# Patient Record
Sex: Female | Born: 1985 | Race: Black or African American | Hispanic: No | Marital: Single | Smoking: Never smoker
Health system: Southern US, Community
[De-identification: ages and names within clinical notes are randomized; demographics above are authoritative.]

## PROBLEM LIST (undated history)

## (undated) ENCOUNTER — Emergency Department (HOSPITAL_COMMUNITY): Discharge status: Absent without leave | Payer: Self-pay

## (undated) ENCOUNTER — Inpatient Hospital Stay (HOSPITAL_COMMUNITY): Payer: Self-pay

## (undated) DIAGNOSIS — A6000 Herpesviral infection of urogenital system, unspecified: Secondary | ICD-10-CM

## (undated) DIAGNOSIS — N390 Urinary tract infection, site not specified: Secondary | ICD-10-CM

## (undated) DIAGNOSIS — I10 Essential (primary) hypertension: Secondary | ICD-10-CM

## (undated) DIAGNOSIS — D259 Leiomyoma of uterus, unspecified: Secondary | ICD-10-CM

## (undated) DIAGNOSIS — E119 Type 2 diabetes mellitus without complications: Secondary | ICD-10-CM

## (undated) DIAGNOSIS — O365931 Maternal care for other known or suspected poor fetal growth, third trimester, fetus 1: Secondary | ICD-10-CM | POA: Insufficient documentation

## (undated) DIAGNOSIS — O30033 Twin pregnancy, monochorionic/diamniotic, third trimester: Secondary | ICD-10-CM

## (undated) DIAGNOSIS — M797 Fibromyalgia: Secondary | ICD-10-CM

## (undated) DIAGNOSIS — Z3A32 32 weeks gestation of pregnancy: Secondary | ICD-10-CM | POA: Insufficient documentation

## (undated) HISTORY — DX: Essential (primary) hypertension: I10

## (undated) HISTORY — PX: APPENDECTOMY: SHX54

## (undated) HISTORY — PX: WISDOM TOOTH EXTRACTION: SHX21

## (undated) HISTORY — DX: Urinary tract infection, site not specified: N39.0

## (undated) HISTORY — DX: Fibromyalgia: M79.7

## (undated) HISTORY — PX: FOOT SURGERY: SHX648

## (undated) HISTORY — DX: Herpesviral infection of urogenital system, unspecified: A60.00

---
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
## 2006-04-29 DIAGNOSIS — R358 Other polyuria: Secondary | ICD-10-CM | POA: Insufficient documentation

## 2006-04-29 DIAGNOSIS — R3589 Other polyuria: Secondary | ICD-10-CM | POA: Insufficient documentation

## 2008-05-12 ENCOUNTER — Emergency Department (HOSPITAL_COMMUNITY): Admission: EM | Admit: 2008-05-12 | Discharge: 2008-05-12 | Payer: Self-pay | Admitting: Family Medicine

## 2008-09-12 ENCOUNTER — Emergency Department (HOSPITAL_COMMUNITY): Admission: EM | Admit: 2008-09-12 | Discharge: 2008-09-12 | Payer: Self-pay | Admitting: Family Medicine

## 2009-01-16 ENCOUNTER — Ambulatory Visit: Payer: Self-pay | Admitting: Obstetrics and Gynecology

## 2009-01-17 ENCOUNTER — Encounter: Payer: Self-pay | Admitting: Family

## 2009-01-17 LAB — CONVERTED CEMR LAB
Trich, Wet Prep: NONE SEEN
WBC, Wet Prep HPF POC: NONE SEEN
Yeast Wet Prep HPF POC: NONE SEEN

## 2009-03-03 ENCOUNTER — Emergency Department (HOSPITAL_COMMUNITY): Admission: EM | Admit: 2009-03-03 | Discharge: 2009-03-03 | Payer: Self-pay | Admitting: Family Medicine

## 2009-03-30 ENCOUNTER — Observation Stay (HOSPITAL_COMMUNITY): Admission: EM | Admit: 2009-03-30 | Discharge: 2009-03-30 | Payer: Self-pay | Admitting: Emergency Medicine

## 2009-06-07 ENCOUNTER — Emergency Department (HOSPITAL_COMMUNITY): Admission: EM | Admit: 2009-06-07 | Discharge: 2009-06-07 | Payer: Self-pay | Admitting: Emergency Medicine

## 2010-04-22 ENCOUNTER — Inpatient Hospital Stay (INDEPENDENT_AMBULATORY_CARE_PROVIDER_SITE_OTHER)
Admission: RE | Admit: 2010-04-22 | Discharge: 2010-04-22 | Disposition: A | Discharge status: Home / Self Care | Payer: Self-pay | Source: Ambulatory Visit | Attending: Emergency Medicine | Admitting: Emergency Medicine

## 2010-04-22 DIAGNOSIS — J40 Bronchitis, not specified as acute or chronic: Secondary | ICD-10-CM

## 2010-05-16 ENCOUNTER — Inpatient Hospital Stay (INDEPENDENT_AMBULATORY_CARE_PROVIDER_SITE_OTHER)
Admission: RE | Admit: 2010-05-16 | Discharge: 2010-05-16 | Disposition: A | Discharge status: Home / Self Care | Payer: Self-pay | Source: Ambulatory Visit | Attending: Family Medicine | Admitting: Family Medicine

## 2010-05-16 DIAGNOSIS — I889 Nonspecific lymphadenitis, unspecified: Secondary | ICD-10-CM

## 2010-05-16 LAB — POCT RAPID STREP A (OFFICE): Streptococcus, Group A Screen (Direct): NEGATIVE

## 2010-05-18 LAB — RAPID STREP SCREEN (MED CTR MEBANE ONLY): Streptococcus, Group A Screen (Direct): NEGATIVE

## 2010-05-19 ENCOUNTER — Inpatient Hospital Stay (INDEPENDENT_AMBULATORY_CARE_PROVIDER_SITE_OTHER)
Admission: RE | Admit: 2010-05-19 | Discharge: 2010-05-19 | Disposition: A | Discharge status: Home / Self Care | Payer: Self-pay | Source: Ambulatory Visit | Attending: Family Medicine | Admitting: Family Medicine

## 2010-05-19 DIAGNOSIS — H6980 Other specified disorders of Eustachian tube, unspecified ear: Secondary | ICD-10-CM

## 2010-11-05 ENCOUNTER — Inpatient Hospital Stay (INDEPENDENT_AMBULATORY_CARE_PROVIDER_SITE_OTHER)
Admission: RE | Admit: 2010-11-05 | Discharge: 2010-11-05 | Disposition: A | Discharge status: Home / Self Care | Payer: Self-pay | Source: Ambulatory Visit | Attending: Emergency Medicine | Admitting: Emergency Medicine

## 2010-11-05 ENCOUNTER — Emergency Department (HOSPITAL_COMMUNITY)
Admission: EM | Admit: 2010-11-05 | Discharge: 2010-11-05 | Disposition: A | Discharge status: Home / Self Care | Payer: Self-pay | Attending: Emergency Medicine | Admitting: Emergency Medicine

## 2010-11-05 ENCOUNTER — Emergency Department (HOSPITAL_COMMUNITY): Payer: Self-pay

## 2010-11-05 ENCOUNTER — Ambulatory Visit (INDEPENDENT_AMBULATORY_CARE_PROVIDER_SITE_OTHER): Payer: Self-pay

## 2010-11-05 DIAGNOSIS — R1011 Right upper quadrant pain: Secondary | ICD-10-CM

## 2010-11-05 DIAGNOSIS — N898 Other specified noninflammatory disorders of vagina: Secondary | ICD-10-CM | POA: Insufficient documentation

## 2010-11-05 DIAGNOSIS — N949 Unspecified condition associated with female genital organs and menstrual cycle: Secondary | ICD-10-CM | POA: Insufficient documentation

## 2010-11-05 DIAGNOSIS — R1031 Right lower quadrant pain: Secondary | ICD-10-CM

## 2010-11-05 DIAGNOSIS — D259 Leiomyoma of uterus, unspecified: Secondary | ICD-10-CM | POA: Insufficient documentation

## 2010-11-05 LAB — DIFFERENTIAL
Basophils Absolute: 0 10*3/uL (ref 0.0–0.1)
Basophils Relative: 0 % (ref 0–1)
Eosinophils Absolute: 0.1 10*3/uL (ref 0.0–0.7)
Eosinophils Relative: 2 % (ref 0–5)
Lymphocytes Relative: 38 % (ref 12–46)
Lymphs Abs: 2.6 10*3/uL (ref 0.7–4.0)
Monocytes Absolute: 0.6 10*3/uL (ref 0.1–1.0)
Monocytes Relative: 8 % (ref 3–12)
Neutro Abs: 3.5 10*3/uL (ref 1.7–7.7)
Neutrophils Relative %: 52 % (ref 43–77)

## 2010-11-05 LAB — CBC
HCT: 36.9 % (ref 36.0–46.0)
Hemoglobin: 12.5 g/dL (ref 12.0–15.0)
MCH: 29.8 pg (ref 26.0–34.0)
MCHC: 33.9 g/dL (ref 30.0–36.0)
MCV: 87.9 fL (ref 78.0–100.0)
Platelets: 282 10*3/uL (ref 150–400)
RBC: 4.2 MIL/uL (ref 3.87–5.11)
RDW: 14.4 % (ref 11.5–15.5)
WBC: 6.8 10*3/uL (ref 4.0–10.5)

## 2010-11-05 LAB — WET PREP, GENITAL
Clue Cells Wet Prep HPF POC: NONE SEEN
Trich, Wet Prep: NONE SEEN
Yeast Wet Prep HPF POC: NONE SEEN

## 2010-11-05 LAB — BASIC METABOLIC PANEL
BUN: 9 mg/dL (ref 6–23)
CO2: 22 mEq/L (ref 19–32)
Calcium: 9.5 mg/dL (ref 8.4–10.5)
Chloride: 105 mEq/L (ref 96–112)
Creatinine, Ser: 0.6 mg/dL (ref 0.50–1.10)
GFR calc Af Amer: >60 mL/min (ref >60)
GFR calc non Af Amer: >60 mL/min (ref >60)
Glucose, Bld: 82 mg/dL (ref 70–99)
Potassium: 3.9 mEq/L (ref 3.5–5.1)
Sodium: 137 mEq/L (ref 135–145)

## 2010-11-05 LAB — URINALYSIS, ROUTINE W REFLEX MICROSCOPIC
Bilirubin Urine: NEGATIVE
Glucose, UA: NEGATIVE mg/dL
Hgb urine dipstick: NEGATIVE
Ketones, ur: NEGATIVE mg/dL
Leukocytes, UA: NEGATIVE
Nitrite: NEGATIVE
Protein, ur: NEGATIVE mg/dL
Specific Gravity, Urine: 1.012 (ref 1.005–1.030)
Urobilinogen, UA: 0.2 mg/dL (ref 0.0–1.0)
pH: 7 (ref 5.0–8.0)

## 2010-11-05 LAB — POCT URINALYSIS DIP (DEVICE)
Bilirubin Urine: NEGATIVE
Glucose, UA: NEGATIVE mg/dL
Ketones, ur: NEGATIVE mg/dL
Leukocytes, UA: NEGATIVE
Nitrite: NEGATIVE
Protein, ur: NEGATIVE mg/dL
Specific Gravity, Urine: 1.025 (ref 1.005–1.030)
Urobilinogen, UA: 0.2 mg/dL (ref 0.0–1.0)
pH: 6 (ref 5.0–8.0)

## 2010-11-05 LAB — POCT PREGNANCY, URINE: Preg Test, Ur: NEGATIVE

## 2010-11-06 LAB — GC/CHLAMYDIA PROBE AMP, GENITAL
Chlamydia, DNA Probe: NEGATIVE
GC Probe Amp, Genital: NEGATIVE

## 2010-11-13 ENCOUNTER — Inpatient Hospital Stay (INDEPENDENT_AMBULATORY_CARE_PROVIDER_SITE_OTHER)
Admission: RE | Admit: 2010-11-13 | Discharge: 2010-11-13 | Disposition: A | Discharge status: Home / Self Care | Payer: Self-pay | Source: Ambulatory Visit | Attending: Emergency Medicine | Admitting: Emergency Medicine

## 2010-11-13 DIAGNOSIS — J069 Acute upper respiratory infection, unspecified: Secondary | ICD-10-CM

## 2010-12-19 ENCOUNTER — Ambulatory Visit (INDEPENDENT_AMBULATORY_CARE_PROVIDER_SITE_OTHER): Payer: Self-pay | Admitting: Obstetrics and Gynecology

## 2010-12-19 ENCOUNTER — Encounter: Payer: Self-pay | Admitting: Obstetrics and Gynecology

## 2010-12-19 DIAGNOSIS — R109 Unspecified abdominal pain: Secondary | ICD-10-CM | POA: Insufficient documentation

## 2010-12-19 DIAGNOSIS — R1084 Generalized abdominal pain: Secondary | ICD-10-CM

## 2010-12-19 LAB — COMPREHENSIVE METABOLIC PANEL
ALT: 12 U/L (ref 0–35)
AST: 16 U/L (ref 0–37)
Albumin: 4.2 g/dL (ref 3.5–5.2)
Alkaline Phosphatase: 59 U/L (ref 39–117)
BUN: 12 mg/dL (ref 6–23)
CO2: 22 mEq/L (ref 19–32)
Calcium: 9.4 mg/dL (ref 8.4–10.5)
Chloride: 106 mEq/L (ref 96–112)
Creat: 0.65 mg/dL (ref 0.50–1.10)
Glucose, Bld: 82 mg/dL (ref 70–99)
Potassium: 3.8 mEq/L (ref 3.5–5.3)
Sodium: 137 mEq/L (ref 135–145)
Total Bilirubin: 0.3 mg/dL (ref 0.3–1.2)
Total Protein: 7.4 g/dL (ref 6.0–8.3)

## 2010-12-19 LAB — LIPASE: Lipase: 28 U/L (ref 0–75)

## 2010-12-19 LAB — AMYLASE: Amylase: 44 U/L (ref 0–105)

## 2010-12-19 NOTE — Patient Instructions (Signed)
Abdominal Pain (Nonspecific) Your exam might not show the exact reason you have abdominal pain. Since there are many different causes of abdominal pain, another checkup and more tests may be needed. It is very important to follow up for lasting (persistent) or worsening symptoms. A possible cause of abdominal pain in any person who still has his or her appendix is acute appendicitis. Appendicitis is often hard to diagnose. Normal blood tests, urine tests, ultrasound, and CT scans do not completely rule out early appendicitis or other causes of abdominal pain. Sometimes, only the changes that happen over time will allow appendicitis and other causes of abdominal pain to be determined. Other potential problems that may require surgery may also take time to become more apparent. Because of this, it is important that you follow all of the instructions below. HOME CARE INSTRUCTIONS   Rest as much as possible.   Do not eat solid food until your pain is gone.   While adults or children have pain: A diet of water, weak decaffeinated tea, broth or bouillon, gelatin, oral rehydration solutions (ORS), frozen ice pops, or ice chips may be helpful.   When pain is gone in adults or children: Start a light diet (dry toast, crackers, applesauce, or white rice). Increase the diet slowly as long as it does not bother you. Eat no dairy products (including cheese and eggs) and no spicy, fatty, fried, or high-fiber foods.   Use no alcohol, caffeine, or cigarettes.   Take your regular medicines unless your caregiver told you not to.   Take any prescribed medicine as directed.   Only take over-the-counter or prescription medicines for pain, discomfort, or fever as directed by your caregiver. Do not give aspirin to children.  If your caregiver has given you a follow-up appointment, it is very important to keep that appointment. Not keeping the appointment could result in a permanent injury and/or lasting (chronic) pain  and/or disability. If there is any problem keeping the appointment, you must call to reschedule.  SEEK IMMEDIATE MEDICAL CARE IF:   Your pain is not gone in 24 hours.   Your pain becomes worse, changes location, or feels different.   You or your child has an oral temperature above 102 F (38.9 C), not controlled by medicine.   Your baby is older than 3 months with a rectal temperature of 102 F (38.9 C) or higher.   Your baby is 3 months old or younger with a rectal temperature of 100.4 F (38 C) or higher.   You have shaking chills.   You keep throwing up (vomiting) or cannot drink liquids.   There is blood in your vomit or you see blood in your bowel movements.   Your bowel movements become dark or black.   You have frequent bowel movements.   Your bowel movements stop (become blocked) or you cannot pass gas.   You have bloody, frequent, or painful urination.   You have yellow discoloration in the skin or whites of the eyes.   Your stomach becomes bloated or bigger.   You have dizziness or fainting.   You have chest or back pain.  MAKE SURE YOU:   Understand these instructions.   Will watch your condition.   Will get help right away if you are not doing well or get worse.  Document Released: 02/16/2005 Document Revised: 10/29/2010 Document Reviewed: 01/14/2009 Exit[employer] Patient Information 2012 [employer]. 

## 2010-12-19 NOTE — Progress Notes (Signed)
  Subjective:    Patient ID: Sherri [name], female    DOB: Sep 13, 1985, 25 y.o.   MRN: [id number]  HPI 25 yo G0 presenting today for evaluation of abdominal pain. Patient reports being seen in the ED on 9/4 for the same complaint and was diagnosed with a 2.4 cm fibroid localized in the right lower uterine segment. Patient states that the pain is unchanged occurrying mainly at night but sometimes related to food intake. The pain is localized in the RUQ, non-radiating, sharp in nature and typically last for 4 hours. Patient states that she typically wakes up the next morning pain free. Patient is otherwise without complaints and reports normal regular bowel movements.  History reviewed. No pertinent past medical history.  History reviewed. No pertinent past surgical history.  History  Substance Use Topics  . Smoking status: Never Smoker   . Smokeless tobacco: Never Used  . Alcohol Use: No   History reviewed. No pertinent family history.    Review of Systems  All other systems reviewed and are negative.       Objective:   Physical Exam  Constitutional: She is oriented to person, place, and time. She appears well-developed and well-nourished.  HENT:  Head: Normocephalic and atraumatic.  Eyes: EOM are normal. Pupils are equal, round, and reactive to light.  Neck: Normal range of motion. Neck supple.  Cardiovascular: Normal rate and regular rhythm.   Pulmonary/Chest: Effort normal and breath sounds normal.  Abdominal: Soft. Bowel sounds are normal. She exhibits no distension and no mass. There is tenderness. There is no rebound and no guarding.       Point tenderness in RUQ, no rebound, no guarding.  Genitourinary: Uterus normal.       No palpable masses or tenderness  Neurological: She is alert and oriented to person, place, and time.  Skin: Skin is warm. No rash noted. No erythema.      Assessment & Plan:   25 yo Go with RUQ pain - will check CMP and amylase, lipase - RUQ  ultrasound ordered - patient [facility] for further evaluation as there does not appear to be any gynecologic origin to her pain. - patient to return for full gynecologic visit at her discretion

## 2010-12-25 ENCOUNTER — Ambulatory Visit (HOSPITAL_COMMUNITY)
Admission: RE | Admit: 2010-12-25 | Discharge: 2010-12-25 | Disposition: A | Discharge status: Home / Self Care | Payer: Self-pay | Source: Ambulatory Visit | Attending: Obstetrics and Gynecology | Admitting: Obstetrics and Gynecology

## 2010-12-25 DIAGNOSIS — R1011 Right upper quadrant pain: Secondary | ICD-10-CM | POA: Insufficient documentation

## 2011-01-01 ENCOUNTER — Telehealth: Payer: Self-pay | Admitting: *Deleted

## 2011-01-01 NOTE — Telephone Encounter (Signed)
Called patient- gave patient results of lab and Ultrasound.  Patient states still having same pain- referral visit notes and found referral to [facility]- patient states she has not gotten an appointment. Informed patient we would need to make referral and call her with an appointment

## 2011-01-01 NOTE — Telephone Encounter (Signed)
Pt called wanting results of lab work and ultra sound. Pt can be reached at [phone].

## 2011-01-07 NOTE — Telephone Encounter (Signed)
Patient to be established at [facility].  Will route to the NP coordinator to set up NP appointment.

## 2011-01-07 NOTE — Telephone Encounter (Signed)
Here is the referral

## 2011-01-07 NOTE — Telephone Encounter (Signed)
Am forwarding this note to Sherri [name] At [facility] for referral appointment- please call patient with appt. thanks

## 2011-01-08 NOTE — Telephone Encounter (Signed)
Mailed np packet to patient, once returned will call to schedule appt, pt agreed.

## 2011-02-04 ENCOUNTER — Other Ambulatory Visit (HOSPITAL_COMMUNITY)
Admission: RE | Admit: 2011-02-04 | Discharge: 2011-02-04 | Disposition: A | Discharge status: Home / Self Care | Payer: Medicaid Other | Source: Ambulatory Visit | Attending: Obstetrics & Gynecology | Admitting: Obstetrics & Gynecology

## 2011-02-04 ENCOUNTER — Encounter: Payer: Self-pay | Admitting: Obstetrics and Gynecology

## 2011-02-04 ENCOUNTER — Ambulatory Visit (INDEPENDENT_AMBULATORY_CARE_PROVIDER_SITE_OTHER): Payer: Medicaid Other | Admitting: Obstetrics and Gynecology

## 2011-02-04 VITALS — BP 122/80 | HR 70 | Temp 97.1°F | Ht 59.0 in | Wt 146.5 lb

## 2011-02-04 DIAGNOSIS — Z01419 Encounter for gynecological examination (general) (routine) without abnormal findings: Secondary | ICD-10-CM

## 2011-02-04 MED ORDER — DROSPIRENONE-ETHINYL ESTRADIOL 3-0.02 MG PO TABS: 1.0000 | ORAL_TABLET | Freq: Every day | ORAL | Status: DC

## 2011-02-04 MED ORDER — METRONIDAZOLE 500 MG PO TABS: 500.0000 mg | ORAL_TABLET | Freq: Two times a day (BID) | ORAL | Status: AC

## 2011-02-06 NOTE — Progress Notes (Signed)
S: Pt presents today for yearly Pap and PE. She states she is doing well but has noticed a vag dc with some irritation. She is currently taking Yaz for birth control would like to continue. She has no other complaints at this time. O: VSS A&Ox3 in NAD HEENT essentially normal Heart RRR without murmur or gallop LCTAB Abd soft, nontender NL external genitalia Thin, white vag dc present in vag vault. Wet prep done and shows clue cells. Pap done without difficulty. Bimanual exam reveals uterus to be NL size and shape with no adnexal masses. Pt nontender on exam. A/P: Yearly: pt doing well. Will refill Yaz x 45yr. Discussed safe sex practices. She will return in 55yr for PE.  BV: discussed with pt at length. Will tx with Flagyl. Warned of antabuse reaction. Discussed diet, activity, risks, and precautions.  Sherri [name]. Rice III, DrHSc, MPAS, PA-C

## 2011-02-09 ENCOUNTER — Ambulatory Visit: Payer: Self-pay | Admitting: Family Medicine

## 2011-02-19 ENCOUNTER — Encounter (HOSPITAL_COMMUNITY): Payer: Self-pay | Admitting: Emergency Medicine

## 2011-02-19 ENCOUNTER — Encounter (HOSPITAL_COMMUNITY): Payer: Self-pay

## 2011-02-19 ENCOUNTER — Emergency Department (HOSPITAL_COMMUNITY)
Admission: EM | Admit: 2011-02-19 | Discharge: 2011-02-20 | Disposition: A | Discharge status: Home / Self Care | Payer: Medicaid Other | Attending: Emergency Medicine | Admitting: Emergency Medicine

## 2011-02-19 ENCOUNTER — Emergency Department (INDEPENDENT_AMBULATORY_CARE_PROVIDER_SITE_OTHER)
Admission: EM | Admit: 2011-02-19 | Discharge: 2011-02-19 | Disposition: A | Discharge status: Home / Self Care | Payer: Medicaid Other | Source: Home / Self Care | Attending: Family Medicine | Admitting: Family Medicine

## 2011-02-19 DIAGNOSIS — N920 Excessive and frequent menstruation with regular cycle: Secondary | ICD-10-CM | POA: Insufficient documentation

## 2011-02-19 DIAGNOSIS — D259 Leiomyoma of uterus, unspecified: Secondary | ICD-10-CM | POA: Insufficient documentation

## 2011-02-19 DIAGNOSIS — N949 Unspecified condition associated with female genital organs and menstrual cycle: Secondary | ICD-10-CM

## 2011-02-19 DIAGNOSIS — R102 Pelvic and perineal pain: Secondary | ICD-10-CM

## 2011-02-19 DIAGNOSIS — D219 Benign neoplasm of connective and other soft tissue, unspecified: Secondary | ICD-10-CM

## 2011-02-19 DIAGNOSIS — N898 Other specified noninflammatory disorders of vagina: Secondary | ICD-10-CM

## 2011-02-19 DIAGNOSIS — R1032 Left lower quadrant pain: Secondary | ICD-10-CM | POA: Insufficient documentation

## 2011-02-19 DIAGNOSIS — N939 Abnormal uterine and vaginal bleeding, unspecified: Secondary | ICD-10-CM

## 2011-02-19 HISTORY — DX: Leiomyoma of uterus, unspecified: D25.9

## 2011-02-19 LAB — POCT URINALYSIS DIP (DEVICE)
Bilirubin Urine: NEGATIVE
Glucose, UA: NEGATIVE mg/dL
Ketones, ur: NEGATIVE mg/dL
Leukocytes, UA: NEGATIVE
Nitrite: NEGATIVE
Protein, ur: 30 mg/dL — AB
Specific Gravity, Urine: 1.02 (ref 1.005–1.030)
Urobilinogen, UA: 0.2 mg/dL (ref 0.0–1.0)
pH: 5.5 (ref 5.0–8.0)

## 2011-02-19 LAB — DIFFERENTIAL
Basophils Absolute: 0 10*3/uL (ref 0.0–0.1)
Basophils Relative: 0 % (ref 0–1)
Eosinophils Absolute: 0.2 10*3/uL (ref 0.0–0.7)
Eosinophils Relative: 3 % (ref 0–5)
Lymphocytes Relative: 56 % — ABNORMAL HIGH (ref 12–46)
Lymphs Abs: 4.1 10*3/uL — ABNORMAL HIGH (ref 0.7–4.0)
Monocytes Absolute: 0.4 10*3/uL (ref 0.1–1.0)
Monocytes Relative: 5 % (ref 3–12)
Neutro Abs: 2.7 10*3/uL (ref 1.7–7.7)
Neutrophils Relative %: 37 % — ABNORMAL LOW (ref 43–77)

## 2011-02-19 LAB — CBC
HCT: 37.3 % (ref 36.0–46.0)
Hemoglobin: 12.4 g/dL (ref 12.0–15.0)
MCH: 29.3 pg (ref 26.0–34.0)
MCHC: 33.2 g/dL (ref 30.0–36.0)
MCV: 88.2 fL (ref 78.0–100.0)
Platelets: 266 10*3/uL (ref 150–400)
RBC: 4.23 MIL/uL (ref 3.87–5.11)
RDW: 14.1 % (ref 11.5–15.5)
WBC: 7.4 10*3/uL (ref 4.0–10.5)

## 2011-02-19 LAB — WET PREP, GENITAL
Clue Cells Wet Prep HPF POC: NONE SEEN
Trich, Wet Prep: NONE SEEN
Yeast Wet Prep HPF POC: NONE SEEN

## 2011-02-19 LAB — POCT PREGNANCY, URINE: Preg Test, Ur: NEGATIVE

## 2011-02-19 MED ORDER — IBUPROFEN 800 MG PO TABS
800.0000 mg | ORAL_TABLET | Freq: Once | ORAL | Status: AC
  Administered 2011-02-19: 800 mg via ORAL

## 2011-02-19 MED ORDER — IBUPROFEN 800 MG PO TABS: 800.0000 mg | ORAL_TABLET | Freq: Three times a day (TID) | ORAL | Status: AC

## 2011-02-19 MED ORDER — IBUPROFEN 800 MG PO TABS
ORAL_TABLET | ORAL | Status: AC
  Filled 2011-02-19: qty 1

## 2011-02-19 NOTE — ED Notes (Signed)
C/o vaginal irritation, urinary frequency and RLQ pain since yesterday.  Denies vaginal discharge or discomfort with voiding.  Also states she had menses 02/08/11 and then started period again yesterday.

## 2011-02-19 NOTE — ED Notes (Signed)
PT. REPORTS VAGINAL BLEEDING WITH RIGHT LATERAL ABDOMINAL PAIN ONSET LAST NIGHT ,  DENIES VOMITTING OR DIARRHEA, SLIGHT NAUSEA.  FEVER WITH CHILLS.

## 2011-02-19 NOTE — ED Provider Notes (Signed)
History     CSN: [id number]  Arrival date & time 02/19/11  4098   First MD Initiated Contact with Patient 02/19/11 1015      Chief Complaint  Patient presents with  . Vaginal Itching  . Menstrual Problem    (Consider location/radiation/quality/duration/timing/severity/associated sxs/prior treatment) HPI Comments: Pt states she had a normal menses 02-08-11. Then began having vaginal bleeding again yesterday. She has been on Yaz for approx 2 yrs and denies missing any pills. Also onset of RLQ abd pain yesterday, achey pain, no worsening. Has genital itching but no vaginal discharge. No new sexual partners. Has had dysuria and urinary frequency also since yesterday.  Pt has had the same pain previously, and was seen in 11-05-10 and had Pelvic US.  Annual pap/pelvic exam was also done this month with GYN.  Patient is a 25 y.o. female presenting with vaginal bleeding and abdominal pain. The history is provided by the patient.  Vaginal Bleeding This is a new problem. The current episode started yesterday. The problem occurs constantly. The problem has not changed since onset.Associated symptoms include abdominal pain. Pertinent negatives include no chest pain and no shortness of breath. The symptoms are aggravated by nothing. The symptoms are relieved by nothing. She has tried nothing for the symptoms.  Abdominal Pain The primary symptoms of the illness include abdominal pain and vaginal bleeding. The primary symptoms of the illness do not include fever, shortness of breath, nausea, vomiting or dysuria. The current episode started yesterday. The onset of the illness was sudden. The problem has not changed since onset. The abdominal pain began yesterday. The pain came on suddenly. The abdominal pain has been unchanged since its onset. The abdominal pain is located in the RLQ. The abdominal pain does not radiate. The abdominal pain is relieved by nothing.  The patient states that she believes she is  currently not pregnant. The patient has not had a change in bowel habit. Additional symptoms associated with the illness include frequency. Symptoms associated with the illness do not include chills, constipation, urgency or back pain.    Past Medical History  Diagnosis Date  . Uterine fibroid     History reviewed. No pertinent past surgical history.  History reviewed. No pertinent family history.  History  Substance Use Topics  . Smoking status: Never Smoker   . Smokeless tobacco: Never Used  . Alcohol Use: No    OB History    Grav Para Term Preterm Abortions TAB SAB Ect Mult Living   0               Review of Systems  Constitutional: Negative for fever, chills and appetite change.  Respiratory: Negative for shortness of breath.   Cardiovascular: Negative for chest pain.  Gastrointestinal: Positive for abdominal pain. Negative for nausea, vomiting and constipation.  Genitourinary: Positive for frequency and vaginal bleeding. Negative for dysuria, urgency and flank pain.  Musculoskeletal: Negative for back pain.    Allergies  Review of patient's allergies indicates no known allergies.  Home Medications   Current Outpatient Rx  Name Route Sig Dispense Refill  . DROSPIRENONE-ETHINYL ESTRADIOL 3-0.02 MG PO TABS Oral Take 1 tablet by mouth daily. 1 Package 12  . IBUPROFEN 800 MG PO TABS Oral Take 1 tablet (800 mg total) by mouth 3 (three) times daily. 15 tablet 0    BP 131/80  Pulse 86  Temp(Src) 98.2 F (36.8 C) (Oral)  Resp 16  SpO2 99%  LMP 02/18/2011  Physical Exam  Nursing note and vitals reviewed. Constitutional: She appears well-developed and well-nourished. No distress.  Neck: Neck supple. No thyromegaly present.  Cardiovascular: Normal rate, regular rhythm and normal heart sounds.   Pulmonary/Chest: Effort normal and breath sounds normal. No respiratory distress.  Abdominal: Soft. Bowel sounds are normal. She exhibits no distension. There is tenderness  in the suprapubic area. There is no rebound, no guarding and no tenderness at McBurney's point.  Genitourinary: Uterus normal. There is no rash, tenderness or lesion on the right labia. There is no rash, tenderness or lesion on the left labia. Cervix exhibits no motion tenderness, no discharge and no friability. Right adnexum displays tenderness. Right adnexum displays no mass and no fullness. Left adnexum displays no mass, no tenderness and no fullness. There is bleeding (small amount of blood noted in vagina, no bleeding from os) around the vagina. No erythema or tenderness around the vagina. No foreign body around the vagina. No signs of injury around the vagina. No vaginal discharge found.  Neurological: She is alert.  Skin: Skin is warm and dry.  Psychiatric: She has a normal mood and affect.    ED Course  Procedures (including critical care time)  Labs Reviewed  POCT URINALYSIS DIP (DEVICE) - Abnormal; Notable for the following:    Hgb urine dipstick LARGE (*)    Protein, ur 30 (*)    All other components within normal limits  WET PREP, GENITAL - Abnormal; Notable for the following:    WBC, Wet Prep HPF POC FEW (*)    All other components within normal limits  POCT PREGNANCY, URINE  GC/CHLAMYDIA PROBE AMP, GENITAL   No results found.   1. Abnormal vaginal bleeding   2. Acute pelvic pain, female   3. Uterine fibroid       MDM  Rt adnexal pain and abnormal vaginal bleeding. Hx of same Rt adnexal pain w/ PUS 11-05-10 at [facility] ED showing uterine fibroid as source of symptoms. PUS and ED notes reviewed. Discussed with pt that this is most likely the cause of her symptoms today. If she has change or worsening of symptoms she will need to go to [facility] ED for further eval.  Urine preg neg. UA neg leuks.        Melody Comas, [place] 02/19/11 2036

## 2011-02-20 ENCOUNTER — Emergency Department (HOSPITAL_COMMUNITY): Payer: Medicaid Other

## 2011-02-20 LAB — BASIC METABOLIC PANEL
BUN: 11 mg/dL (ref 6–23)
CO2: 24 mEq/L (ref 19–32)
Calcium: 9.7 mg/dL (ref 8.4–10.5)
Chloride: 104 mEq/L (ref 96–112)
Creatinine, Ser: 0.7 mg/dL (ref 0.50–1.10)
GFR calc Af Amer: >90 mL/min (ref >90)
GFR calc non Af Amer: >90 mL/min (ref >90)
Glucose, Bld: 96 mg/dL (ref 70–99)
Potassium: 3.7 mEq/L (ref 3.5–5.1)
Sodium: 136 mEq/L (ref 135–145)

## 2011-02-20 LAB — GC/CHLAMYDIA PROBE AMP, GENITAL
Chlamydia, DNA Probe: NEGATIVE
GC Probe Amp, Genital: NEGATIVE

## 2011-02-20 LAB — URINALYSIS, ROUTINE W REFLEX MICROSCOPIC
Bilirubin Urine: NEGATIVE
Glucose, UA: NEGATIVE mg/dL
Ketones, ur: NEGATIVE mg/dL
Leukocytes, UA: NEGATIVE
Nitrite: NEGATIVE
Protein, ur: NEGATIVE mg/dL
Specific Gravity, Urine: 1.023 (ref 1.005–1.030)
Urobilinogen, UA: 1 mg/dL (ref 0.0–1.0)
pH: 7 (ref 5.0–8.0)

## 2011-02-20 LAB — URINE MICROSCOPIC-ADD ON

## 2011-02-20 LAB — POCT PREGNANCY, URINE: Preg Test, Ur: NEGATIVE

## 2011-02-20 MED ORDER — TRAMADOL HCL 50 MG PO TABS: 50.0000 mg | ORAL_TABLET | Freq: Four times a day (QID) | ORAL | Status: AC | PRN

## 2011-02-20 MED ORDER — KETOROLAC TROMETHAMINE 60 MG/2ML IM SOLN: 60.0000 mg | Freq: Once | INTRAMUSCULAR | Status: DC

## 2011-02-20 NOTE — ED Notes (Signed)
Pt refused to have shot. Stated would take  advil at home .

## 2011-02-20 NOTE — ED Provider Notes (Signed)
Medical screening examination/treatment/procedure(s) were performed by non-physician practitioner and as supervising physician I was immediately available for consultation/collaboration.   [facility]; MD   Sharin Grave, MD 02/20/11 [phone]

## 2011-02-20 NOTE — ED Provider Notes (Signed)
History     CSN: [id number]  Arrival date & time 02/19/11  2246   First MD Initiated Contact with Patient 02/20/11 0058      No chief complaint on file.   (Consider location/radiation/quality/duration/timing/severity/associated sxs/prior treatment) Patient is a 25 y.o. female presenting with abdominal pain. The history is provided by the patient. No language interpreter was used.  Abdominal Pain The primary symptoms of the illness include abdominal pain and vaginal bleeding. The primary symptoms of the illness do not include fever, fatigue, shortness of breath, nausea, vomiting, diarrhea, hematemesis, hematochezia, dysuria or vaginal discharge. The current episode started yesterday. The onset of the illness was gradual. The problem has not changed since onset. Associated with: none. The patient states that she believes she is currently not pregnant. The patient has not had a change in bowel habit. Risk factors: none. Symptoms associated with the illness do not include chills, anorexia, diaphoresis, heartburn, constipation, urgency, hematuria, frequency or back pain. Significant associated medical issues do not include PUD, GERD, inflammatory bowel disease, diabetes, sickle cell disease, gallstones, liver disease, substance abuse, diverticulitis, HIV or cardiac disease. Associated medical issues comments: fibroids.    Past Medical History  Diagnosis Date  . Uterine fibroid     History reviewed. No pertinent past surgical history.  No family history on file.  History  Substance Use Topics  . Smoking status: Never Smoker   . Smokeless tobacco: Never Used  . Alcohol Use: No    OB History    Grav Para Term Preterm Abortions TAB SAB Ect Mult Living   0               Review of Systems  Constitutional: Negative for fever, chills, diaphoresis and fatigue.  HENT: Negative for facial swelling.   Eyes: Negative for discharge.  Respiratory: Negative for shortness of breath.     Cardiovascular: Negative for chest pain.  Gastrointestinal: Positive for abdominal pain. Negative for heartburn, nausea, vomiting, diarrhea, constipation, hematochezia, abdominal distention, anorexia and hematemesis.  Genitourinary: Positive for vaginal bleeding. Negative for dysuria, urgency, frequency, hematuria and vaginal discharge.  Musculoskeletal: Negative for back pain.  Neurological: Negative for dizziness.  Hematological: Negative.   Psychiatric/Behavioral: Negative.     Allergies  Review of patient's allergies indicates no known allergies.  Home Medications   Current Outpatient Rx  Name Route Sig Dispense Refill  . DROSPIRENONE-ETHINYL ESTRADIOL 3-0.02 MG PO TABS Oral Take 1 tablet by mouth daily.      . IBUPROFEN 800 MG PO TABS Oral Take 1 tablet (800 mg total) by mouth 3 (three) times daily. 15 tablet 0    BP 135/89  Pulse 81  Temp(Src) 98.5 F (36.9 C) (Oral)  Resp 18  SpO2 100%  LMP 02/08/2011  Physical Exam  Constitutional: She is oriented to person, place, and time. She appears well-developed and well-nourished. No distress.  HENT:  Head: Normocephalic and atraumatic.  Mouth/Throat: Oropharynx is clear and moist.  Eyes: EOM are normal. Pupils are equal, round, and reactive to light.  Neck: Normal range of motion. Neck supple.  Cardiovascular: Normal rate and regular rhythm.   Pulmonary/Chest: Effort normal and breath sounds normal. She has no wheezes. She has no rales.  Abdominal: Soft. Bowel sounds are normal. She exhibits no mass. There is no tenderness. There is no rebound and no guarding.  Musculoskeletal: Normal range of motion.  Neurological: She is alert and oriented to person, place, and time.  Skin: Skin is warm and dry.  Psychiatric:  Thought content normal.    ED Course  Procedures (including critical care time)  Labs Reviewed  URINALYSIS, ROUTINE W REFLEX MICROSCOPIC - Abnormal; Notable for the following:    APPearance CLOUDY (*)    Hgb  urine dipstick SMALL (*)    All other components within normal limits  DIFFERENTIAL - Abnormal; Notable for the following:    Neutrophils Relative 37 (*)    Lymphocytes Relative 56 (*)    Lymphs Abs 4.1 (*)    All other components within normal limits  URINE MICROSCOPIC-ADD ON - Abnormal; Notable for the following:    Squamous Epithelial / LPF FEW (*)    All other components within normal limits  BASIC METABOLIC PANEL  CBC  POCT PREGNANCY, URINE  POCT PREGNANCY, URINE   Ct Abdomen Pelvis Wo Contrast  02/20/2011  *RADIOLOGY REPORT*  Clinical Data: Right lower quadrant pain.  CT ABDOMEN AND PELVIS WITHOUT CONTRAST  Technique:  Multidetector CT imaging of the abdomen and pelvis was performed following the standard protocol without intravenous contrast.  Comparison: None.  Findings: Limited images through the lung bases demonstrate no significant appreciable abnormality. The heart size is within normal limits. No pleural or pericardial effusion.  Intra-abdominal organ evaluation is limited without intravenous contrast.  Within this limitation, unremarkable liver, biliary system, spleen, pancreas, adrenal glands.  Symmetric renal size.  No hydronephrosis or hydroureter.  Unable to follow the ureters in their entirety however no calcifications along the expected course.  No bowel obstruction.  No CT evidence for colitis.  Appendix within normal limits.  No free intraperitoneal air or fluid.  No lymphadenopathy.  Normal caliber vasculature.  Partially decompressed bladder.  Nonspecific fullness in the left adnexa, inseparable from the uterus.  No free fluid.  No acute osseous abnormality.  IMPRESSION: No hydronephrosis or urinary tract calculi.  Normal appendix.  Soft tissue density inseparable from the uterus and left adnexa is nonspecific by noncontrast CT.  This may simply represent an anteflexed uterus or fibroid.  Can be further evaluated with a pelvic ultrasound.  Original Report Authenticated By:  Waneta Martins, M.D.     No diagnosis found.    MDM  Pelvic performed earlier in the day.  Patient will need to follow up with her GYN regarding irregular bleeding and fibroids.  She verbalizes understanding and agrees to follow up        Leonce Bale Smitty Cords, MD 02/20/11 0454

## 2011-02-20 NOTE — ED Notes (Signed)
Pt ambulatory.  States that she is hungry and requesting food.  Pt told that she could not have anything until evaluated by a doctor. Pt bought something out of the snack machine in the lobby.

## 2011-03-12 ENCOUNTER — Ambulatory Visit (INDEPENDENT_AMBULATORY_CARE_PROVIDER_SITE_OTHER): Payer: Medicaid Other | Admitting: Obstetrics & Gynecology

## 2011-03-12 ENCOUNTER — Encounter: Payer: Self-pay | Admitting: Obstetrics & Gynecology

## 2011-03-12 VITALS — BP 121/77 | HR 76 | Temp 98.3°F | Ht 63.0 in | Wt 151.3 lb

## 2011-03-12 DIAGNOSIS — IMO0001 Reserved for inherently not codable concepts without codable children: Secondary | ICD-10-CM

## 2011-03-12 DIAGNOSIS — M7918 Myalgia, other site: Secondary | ICD-10-CM

## 2011-03-12 NOTE — Patient Instructions (Signed)
Musculoskeletal Pain Musculoskeletal pain is muscle and boney aches and pains. These pains can occur in any part of the body. Your caregiver may treat you without knowing the cause of the pain. They may treat you if blood or urine tests, X-rays, and other tests were normal.  CAUSES There is often not a definite cause or reason for these pains. These pains may be caused by a type of germ (virus). The discomfort may also come from overuse. Overuse includes working out too hard when your body is not fit. Boney aches also come from weather changes. Bone is sensitive to atmospheric pressure changes. HOME CARE INSTRUCTIONS   Ask when your test results will be ready. Make sure you get your test results.   Only take over-the-counter or prescription medicines for pain, discomfort, or fever as directed by your caregiver. If you were given medications for your condition, do not drive, operate machinery or power tools, or sign legal documents for 24 hours. Do not drink alcohol. Do not take sleeping pills or other medications that may interfere with treatment.   Continue all activities unless the activities cause more pain. When the pain lessens, slowly resume normal activities. Gradually increase the intensity and duration of the activities or exercise.   During periods of severe pain, bed rest may be helpful. Lay or sit in any position that is comfortable.   Putting ice on the injured area.   Put ice in a bag.   Place a towel between your skin and the bag.   Leave the ice on for 15 to 20 minutes, 3 to 4 times a day.   Follow up with your caregiver for continued problems and no reason can be found for the pain. If the pain becomes worse or does not go away, it may be necessary to repeat tests or do additional testing. Your caregiver may need to look further for a possible cause.  SEEK IMMEDIATE MEDICAL CARE IF:  You have pain that is getting worse and is not relieved by medications.   You develop  chest pain that is associated with shortness or breath, sweating, feeling sick to your stomach (nauseous), or throw up (vomit).   Your pain becomes localized to the abdomen.   You develop any new symptoms that seem different or that concern you.  MAKE SURE YOU:   Understand these instructions.   Will watch your condition.   Will get help right away if you are not doing well or get worse.  Document Released: 02/16/2005 Document Revised: 10/29/2010 Document Reviewed: 10/07/2007 [employer] Patient Information 2012 [employer]. 

## 2011-03-12 NOTE — Progress Notes (Signed)
History:  26 y.o. G0P0 here today for right sides pelvic pain.  She reports pain occurs about every other day, worse at night after standing all day at work.  No relation to menses, eating or any other activity.  Ibuprofen helps with the pain.  Also reported having 1 day episode of bleeding this month, then her period was short.  She is on Yaz. No other episodes.  The following portions of the patient's history were reviewed and updated as appropriate: allergies, current medications, past family history, past medical history, past social history, past surgical history and problem list.   Objective:  Physical Exam Blood pressure 121/77, pulse 76, temperature 98.3 F (36.8 C), temperature source Oral, height 5\' 3"  (1.6 m), weight 151 lb 4.8 oz (68.629 kg), last menstrual period 03/08/2011. Gen: NAD Abd: Soft, nontender and nondistended.  Patient had pain on her right side/flank area, iliopsoas muscle, easily reproduceable. Pelvic: Deferred  Labs and Imaging Ct Abdomen Pelvis Wo Contrast  02/20/2011  *RADIOLOGY REPORT*  Clinical Data: Right lower quadrant pain.  CT ABDOMEN AND PELVIS WITHOUT CONTRAST  Technique:  Multidetector CT imaging of the abdomen and pelvis was performed following the standard protocol without intravenous contrast.  Comparison: None.  Findings: Limited images through the lung bases demonstrate no significant appreciable abnormality. The heart size is within normal limits. No pleural or pericardial effusion.  Intra-abdominal organ evaluation is limited without intravenous contrast.  Within this limitation, unremarkable liver, biliary system, spleen, pancreas, adrenal glands.  Symmetric renal size.  No hydronephrosis or hydroureter.  Unable to follow the ureters in their entirety however no calcifications along the expected course.  No bowel obstruction.  No CT evidence for colitis.  Appendix within normal limits.  No free intraperitoneal air or fluid.  No lymphadenopathy.  Normal  caliber vasculature.  Partially decompressed bladder.  Nonspecific fullness in the left adnexa, inseparable from the uterus.  No free fluid.  No acute osseous abnormality.  IMPRESSION: No hydronephrosis or urinary tract calculi.  Normal appendix.  Soft tissue density inseparable from the uterus and left adnexa is nonspecific by noncontrast CT.  This may simply represent an anteflexed uterus or fibroid.  Can be further evaluated with a pelvic ultrasound.  Original Report Authenticated By: Waneta Martins, M.D.    Assessment & Plan:  Musculoskeletal Pain Patient was told to use NSAIDs as needed, also suggested massage techniques/therapies that may help with her pain Continue Yaz for now, not concerned about one episode of bleeding while on OCPs. Will continue to observe for now. Return to clinic for any GYN concerns

## 2011-04-12 ENCOUNTER — Encounter (HOSPITAL_COMMUNITY): Payer: Self-pay

## 2011-04-12 ENCOUNTER — Emergency Department (INDEPENDENT_AMBULATORY_CARE_PROVIDER_SITE_OTHER)
Admission: EM | Admit: 2011-04-12 | Discharge: 2011-04-12 | Disposition: A | Discharge status: Home / Self Care | Payer: Self-pay | Source: Home / Self Care

## 2011-04-12 DIAGNOSIS — J069 Acute upper respiratory infection, unspecified: Secondary | ICD-10-CM

## 2011-04-12 NOTE — ED Notes (Signed)
Pt has sorethroat that started on Friday

## 2011-04-12 NOTE — ED Provider Notes (Signed)
History     CSN: [id number]  Arrival date & time 04/12/11  0906   None     Chief Complaint  Patient presents with  . Sore Throat    (Consider location/radiation/quality/duration/timing/severity/associated sxs/prior treatment) HPI Comments: Patient presents with complaints of sore throat. She then clarifies that her discomfort is under her jaw bilaterally. It is more tender to the touch. She denies having discomfort in her oropharynx and no discomfort with swallowing or dysphagia. The tenderness in her neck began 2 days ago, along with mild nasal congestion and cough. No fever, but she has had chills. She has been taking NyQuil for her symptoms with relief.   Past Medical History  Diagnosis Date  . Uterine fibroid     History reviewed. No pertinent past surgical history.  History reviewed. No pertinent family history.  History  Substance Use Topics  . Smoking status: Never Smoker   . Smokeless tobacco: Never Used  . Alcohol Use: No    OB History    Grav Para Term Preterm Abortions TAB SAB Ect Mult Living   0               Review of Systems  Constitutional: Negative for fever, chills and fatigue.  HENT: Positive for congestion, rhinorrhea and neck pain. Negative for ear pain, sore throat, sneezing, trouble swallowing, neck stiffness, postnasal drip and sinus pressure.   Respiratory: Negative for cough, shortness of breath and wheezing.   Cardiovascular: Negative for chest pain and palpitations.    Allergies  Review of patient's allergies indicates no known allergies.  Home Medications   Current Outpatient Rx  Name Route Sig Dispense Refill  . NYQUIL MULTI-SYMPTOM PO Oral Take by mouth.    . DROSPIRENONE-ETHINYL ESTRADIOL 3-0.02 MG PO TABS Oral Take 1 tablet by mouth daily.        BP 149/88  Pulse 86  Temp(Src) 98.2 F (36.8 C) (Oral)  Resp 16  SpO2 99%  LMP 04/11/2011  Physical Exam  Nursing note and vitals reviewed. Constitutional: She appears  well-developed and well-nourished. No distress.  HENT:  Head: Normocephalic and atraumatic.  Right Ear: Tympanic membrane, external ear and ear canal normal.  Left Ear: Tympanic membrane, external ear and ear canal normal.  Nose: Nose normal.  Mouth/Throat: Uvula is midline, oropharynx is clear and moist and mucous membranes are normal. No oropharyngeal exudate, posterior oropharyngeal edema or posterior oropharyngeal erythema.  Neck: Neck supple.  Cardiovascular: Normal rate, regular rhythm and normal heart sounds.   Pulmonary/Chest: Effort normal and breath sounds normal. No respiratory distress.  Lymphadenopathy:       Head (right side): Submandibular (shotty tender) adenopathy present. No submental and no tonsillar adenopathy present.       Head (left side): Submandibular (shotty tender) adenopathy present. No submental and no tonsillar adenopathy present.    She has no cervical adenopathy.       Right: No supraclavicular adenopathy present.       Left: No supraclavicular adenopathy present.  Neurological: She is alert.  Skin: Skin is warm and dry.  Psychiatric: She has a normal mood and affect.    ED Course  Procedures (including critical care time)  Labs Reviewed - No data to display No results found.   1. Acute URI       MDM  Discussed adenopathy with URI symptoms. If symptoms worsen or lymph node swelling does not improve in one week to return for recheck.  Melody Comas, [place] 04/12/11 [phone]

## 2011-04-13 NOTE — ED Provider Notes (Signed)
Medical screening examination/treatment/procedure(s) were performed by non-physician practitioner and as supervising physician I was immediately available for consultation/collaboration.   Teryn Boerema DOUGLAS MD.    Aarion Metzgar Douglas Konnie Noffsinger, MD 04/13/11 1544 

## 2011-06-25 ENCOUNTER — Emergency Department (INDEPENDENT_AMBULATORY_CARE_PROVIDER_SITE_OTHER)
Admission: EM | Admit: 2011-06-25 | Discharge: 2011-06-25 | Disposition: A | Discharge status: Home / Self Care | Payer: Self-pay | Source: Home / Self Care | Attending: Family Medicine | Admitting: Family Medicine

## 2011-06-25 ENCOUNTER — Encounter (HOSPITAL_COMMUNITY): Payer: Self-pay | Admitting: Emergency Medicine

## 2011-06-25 DIAGNOSIS — N644 Mastodynia: Secondary | ICD-10-CM

## 2011-06-25 LAB — POCT URINALYSIS DIP (DEVICE)
Bilirubin Urine: NEGATIVE
Bilirubin Urine: NEGATIVE
Glucose, UA: NEGATIVE mg/dL
Glucose, UA: NEGATIVE mg/dL
Ketones, ur: NEGATIVE mg/dL
Ketones, ur: NEGATIVE mg/dL
Leukocytes, UA: NEGATIVE
Leukocytes, UA: NEGATIVE
Nitrite: NEGATIVE
Nitrite: NEGATIVE
Protein, ur: NEGATIVE mg/dL
Protein, ur: NEGATIVE mg/dL
Specific Gravity, Urine: 1.025 (ref 1.005–1.030)
Specific Gravity, Urine: 1.025 (ref 1.005–1.030)
Urobilinogen, UA: 0.2 mg/dL (ref 0.0–1.0)
Urobilinogen, UA: 0.2 mg/dL (ref 0.0–1.0)
pH: 6 (ref 5.0–8.0)
pH: 6 (ref 5.0–8.0)

## 2011-06-25 LAB — POCT PREGNANCY, URINE: Preg Test, Ur: NEGATIVE

## 2011-06-25 MED ORDER — IBUPROFEN 600 MG PO TABS: 600.0000 mg | ORAL_TABLET | Freq: Three times a day (TID) | ORAL | Status: AC | PRN

## 2011-06-25 NOTE — ED Provider Notes (Signed)
History     CSN: [id number]  Arrival date & time 06/25/11  1007   First MD Initiated Contact with Patient 06/25/11 1050      Chief Complaint  Patient presents with  . Urinary Tract Infection  . Breast Pain    (Consider location/radiation/quality/duration/timing/severity/associated sxs/prior treatment) HPI Comments: The patient reports having bilat breast tenderness x 3 days. Last period 2 wks ago that was normal and on time. She also reports urinary frequency and wanders if she is pregnant. She denies vaginal discharge. No nipple discharge. She denies trauma.   The history is provided by the patient.    Past Medical History  Diagnosis Date  . Uterine fibroid     History reviewed. No pertinent past surgical history.  History reviewed. No pertinent family history.  History  Substance Use Topics  . Smoking status: Never Smoker   . Smokeless tobacco: Never Used  . Alcohol Use: No    OB History    Grav Para Term Preterm Abortions TAB SAB Ect Mult Living   0               Review of Systems  Constitutional: Negative.   HENT: Negative.   Respiratory: Negative.   Cardiovascular: Negative.   Gastrointestinal: Negative.   Musculoskeletal: Negative.   Neurological: Negative.     Allergies  Review of patient's allergies indicates no known allergies.  Home Medications   Current Outpatient Rx  Name Route Sig Dispense Refill  . DROSPIRENONE-ETHINYL ESTRADIOL 3-0.02 MG PO TABS Oral Take 1 tablet by mouth daily.      . IBUPROFEN 600 MG PO TABS Oral Take 1 tablet (600 mg total) by mouth every 8 (eight) hours as needed for pain. 30 tablet 0  . NYQUIL MULTI-SYMPTOM PO Oral Take by mouth.      BP 134/90  Pulse 102  Temp(Src) 98.2 F (36.8 C) (Oral)  Resp 14  SpO2 98%  LMP 06/12/2011  Physical Exam  Nursing note and vitals reviewed. Constitutional: She appears well-developed and well-nourished. No distress.  Cardiovascular: Normal rate and regular rhythm.     Pulmonary/Chest: Effort normal and breath sounds normal.  Abdominal: Soft. Bowel sounds are normal. She exhibits no distension and no mass. There is no tenderness.  Genitourinary:       Breast exam with female nursing personal jill assisting reveals no swelling. No masses. No discharge. No tenderness. Nipples normal. No shin charges.   Skin: Skin is warm and dry.    ED Course  Procedures (including critical care time)  Labs Reviewed  POCT URINALYSIS DIP (DEVICE) - Abnormal; Notable for the following:    Hgb urine dipstick TRACE (*)    All other components within normal limits  POCT URINALYSIS DIP (DEVICE) - Abnormal; Notable for the following:    Hgb urine dipstick TRACE (*)    All other components within normal limits  POCT PREGNANCY, URINE   No results found.   1. Breast tenderness in female       MDM          Randa Spike, MD 06/25/11 1133

## 2011-06-25 NOTE — Discharge Instructions (Signed)
May apply warm compresses. Follow up with a gynecologist if symptoms persist.

## 2011-06-25 NOTE — ED Notes (Signed)
PT HERE WITH CONCERNS OF BILAT NIPPLE PAIN X 3 DYS cancer/O CONSTANT SHARP PAINS AND PAIN WITH TOUCH.NO REPORTS OF SWELLING OR D/C the patient ALSO C/O UTI SX FREQ/URGE AND DYSURIA.NO VAG D/C OR ABD PAIN NOTED.AFEBRILE

## 2011-08-22 ENCOUNTER — Emergency Department (HOSPITAL_COMMUNITY)
Admission: EM | Admit: 2011-08-22 | Discharge: 2011-08-22 | Disposition: A | Discharge status: Home / Self Care | Payer: Self-pay | Source: Home / Self Care | Attending: Emergency Medicine | Admitting: Emergency Medicine

## 2011-08-22 ENCOUNTER — Encounter (HOSPITAL_COMMUNITY): Payer: Self-pay

## 2011-08-22 DIAGNOSIS — H833X9 Noise effects on inner ear, unspecified ear: Secondary | ICD-10-CM

## 2011-08-22 MED ORDER — ANTIPYRINE-BENZOCAINE 5.4-1.4 % OT SOLN: 3.0000 [drp] | OTIC | Status: DC | PRN

## 2011-08-22 NOTE — ED Notes (Signed)
Pt states she went to a concert last night and was standing in front of speaker, started having pain in rt ear after that.

## 2011-08-22 NOTE — ED Provider Notes (Signed)
Chief Complaint  Patient presents with  . Otalgia    rt ear pain    History of Present Illness:   The patient is a 26 year old female who was at a concert last night and was sitting next to have very loud speaker. She was exposed to extremely loud noise. Ever since then she's had pain and ringing in her left ear and difficulty hearing. She denies any drainage from the ear. She's had no nasal congestion, rhinorrhea, headache, or sore throat. And  Review of Systems:  Other than noted above, the patient denies any of the following symptoms: Systemic:  No fevers, chills, sweats, weight loss or gain, fatigue, or tiredness. Eye:  No redness, pain, discharge, itching, blurred vision, or diplopia. ENT:  No headache, nasal congestion, sneezing, itching, epistaxis, ear pain, congestion, decreased hearing, ringing in ears, vertigo, or tinnitus.  No oral lesions, sore throat, pain on swallowing, or hoarseness. Neck:  No mass, tenderness or adenopathy. Lungs:  No coughing, wheezing, or shortness of breath. Skin:  No rash or itching.  PMFSH:  Past medical history, family history, social history, meds, and allergies were reviewed.  Physical Exam:   Vital signs:  BP 136/72  Pulse 76  Temp 99.7 F (37.6 C) (Oral)  Resp 20  SpO2 100%  LMP 07/22/2011 General:  Alert and oriented.  In no distress.  Skin warm and dry. Eye:  PERRL, full EOMs, lids and conjunctiva normal.   ENT:  TMs and canals clear.  Nasal mucosa not congested and without drainage.  Mucous membranes moist, no oral lesions, normal dentition, pharynx clear.  No cranial or facial pain to palplation. Neck:  Supple, full ROM.  No adenopathy, tenderness or mass.  Thyroid normal. Lungs:  Breath sounds clear and equal bilaterally.  No wheezes, rales or rhonchi. Heart:  Rhythm regular, without extrasystoles.  No gallops or murmers. Skin:  Clear, warm and dry.  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  Plan:   1.  The  following meds were prescribed:   New Prescriptions   ANTIPYRINE-BENZOCAINE (AURALGAN) OTIC SOLUTION    Place 3 drops into the left ear every 2 (two) hours as needed for pain.   2.  The patient was instructed in symptomatic care and handouts were given. 3.  The patient was told to return if becoming worse in any way, if no better in 3 or 4 days, and given some red flag symptoms that would indicate earlier return. She was instructed to avoid loud noises, use earplugs, and avoid water exposure.  Follow up:  The patient was told to follow up here if no better in one to 2 weeks.       Reuben Likes, MD 08/22/11 1725

## 2011-08-22 NOTE — Discharge Instructions (Signed)
Avoid loud noise exposure or water in ear for next week.

## 2011-08-24 ENCOUNTER — Encounter (HOSPITAL_COMMUNITY): Payer: Self-pay | Admitting: *Deleted

## 2011-08-24 ENCOUNTER — Emergency Department (INDEPENDENT_AMBULATORY_CARE_PROVIDER_SITE_OTHER)
Admission: EM | Admit: 2011-08-24 | Discharge: 2011-08-24 | Disposition: A | Discharge status: Home / Self Care | Payer: Medicaid Other | Source: Home / Self Care | Attending: Emergency Medicine | Admitting: Emergency Medicine

## 2011-08-24 DIAGNOSIS — H9312 Tinnitus, left ear: Secondary | ICD-10-CM

## 2011-08-24 DIAGNOSIS — S0460XA Injury of acoustic nerve, unspecified side, initial encounter: Secondary | ICD-10-CM

## 2011-08-24 DIAGNOSIS — H9319 Tinnitus, unspecified ear: Secondary | ICD-10-CM

## 2011-08-24 DIAGNOSIS — H9190 Unspecified hearing loss, unspecified ear: Secondary | ICD-10-CM

## 2011-08-24 MED ORDER — PREDNISONE 10 MG PO TABS: 10.0000 mg | ORAL_TABLET | Freq: Every day | ORAL | Status: AC

## 2011-08-24 NOTE — Discharge Instructions (Signed)
   Dr.Shoemaker will see you at the end of this treatment course see number below to repeat her hearing test be very cautious about avoiding further noise exposure as this could worsen your hearing loss and  could be permanent   Tinnitus Sounds you hear in your ears and coming from within the ear is called tinnitus. This can be a symptom of many ear disorders. It is often associated with hearing loss.  Tinnitus can be seen with:  Infections.   Ear blockages such as wax buildup.   Meniere's disease.   Ear damage.   Inherited.   Occupational causes.  While irritating, it is not usually a threat to health. When the cause of the tinnitus is wax, infection in the middle ear, or foreign body it is easily treated. Hearing loss will usually be reversible.  TREATMENT  When treating the underlying cause does not get rid of tinnitus, it may be necessary to get rid of the unwanted sound by covering it up with more pleasant background noises. This may include music, the radio etc. There are tinnitus maskers which can be worn which produce background noise to cover up the tinnitus. Avoid all medications which tend to make tinnitus worse such as alcohol, caffeine, aspirin, and nicotine. There are many soothing background tapes such as rain, ocean, thunderstorms, etc. These soothing sounds help with sleeping or resting. Keep all follow-up appointments and referrals. This is important to identify the cause of the problem. It also helps avoid complications, impaired hearing, disability, or chronic pain. Document Released: 02/16/2005 Document Revised: 02/05/2011 Document Reviewed: 10/05/2007 [facility] Patient Information 2012 [place], [place].

## 2011-08-24 NOTE — ED Notes (Signed)
Pt  Reports  Symptoms  Of  l  Earache      X  3  Days         Pt     Has  Cotton ball in place  In  The  Affected  Ear        Symptoms  X   3  Days         Pt  Sitting  Upright on  Exam table  Speaking in  Complete  sentances

## 2011-08-24 NOTE — ED Provider Notes (Signed)
History     CSN: [id number]  Arrival date & time 08/24/11  1256   First MD Initiated Contact with Patient 08/24/11 1315      Chief Complaint  Patient presents with  . Otalgia    (Consider location/radiation/quality/duration/timing/severity/associated sxs/prior treatment) HPI Comments: Patient returns as she continues to feel they have been in her left ear also with discomfort she is perceiving a headache. No bleeding or ear discharge or fevers. She does describe that she does not hear out of her left ear as she does with her right ear. Patient denies nausea, vertigo or dizziness. She has been applying the eardrops that were previously prescribed here 2 days ago for ear pain. Patient describes that she was in a concert and she was standing beside is one of the speakers in a concert and she's been expressing this discomfort since then and her left ear and a constant ringing sound.  Patient is a 26 y.o. female presenting with ear pain. The history is provided by the patient.  Otalgia This is a new problem. The current episode started more than 2 days ago. There is pain in the left ear. The problem occurs constantly. The problem has not changed since onset.There has been no fever. Associated symptoms include headaches, hearing loss and abdominal pain. Pertinent negatives include no ear discharge, no rhinorrhea, no diarrhea, no vomiting, no neck pain and no rash. Her past medical history does not include chronic ear infection or tympanostomy tube.    Past Medical History  Diagnosis Date  . Uterine fibroid     History reviewed. No pertinent past surgical history.  History reviewed. No pertinent family history.  History  Substance Use Topics  . Smoking status: Never Smoker   . Smokeless tobacco: Never Used  . Alcohol Use: No    OB History    Grav Para Term Preterm Abortions TAB SAB Ect Mult Living   0               Review of Systems  Constitutional: Negative for activity change,  appetite change and fatigue.  HENT: Positive for hearing loss, ear pain and tinnitus. Negative for nosebleeds, congestion, facial swelling, rhinorrhea, sneezing, drooling, mouth sores, neck pain, neck stiffness, dental problem, postnasal drip, sinus pressure and ear discharge.   Eyes: Negative for photophobia, pain and visual disturbance.  Gastrointestinal: Positive for abdominal pain. Negative for vomiting and diarrhea.  Skin: Negative for color change, pallor and rash.  Neurological: Positive for headaches. Negative for dizziness, tremors, seizures, syncope, speech difficulty, weakness, light-headedness and numbness.    Allergies  Review of patient's allergies indicates no known allergies.  Home Medications   Current Outpatient Rx  Name Route Sig Dispense Refill  . ANTIPYRINE-BENZOCAINE 5.4-1.4 % OT SOLN Left Ear Place 3 drops into the left ear every 2 (two) hours as needed for pain. 10 mL 0  . DROSPIRENONE-ETHINYL ESTRADIOL 3-0.02 MG PO TABS Oral Take 1 tablet by mouth daily.      Doreatha Martin MULTI-SYMPTOM PO Oral Take by mouth.      BP 140/66  Pulse 78  Temp 99 F (37.2 C) (Oral)  Resp 18  SpO2 99%  LMP 07/22/2011  Physical Exam  Nursing note and vitals reviewed. Constitutional: She appears well-developed and well-nourished.  HENT:  Head: Normocephalic.  Right Ear: Hearing and tympanic membrane normal.  Left Ear: Hearing and tympanic membrane normal.  Eyes: Conjunctivae are normal.  Neck: Neck supple.  Neurological: She is alert.  Skin: No  rash noted. No erythema.    ED Course  Procedures (including critical care time)  Labs Reviewed - No data to display No results found.   No diagnosis found.    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 Have called ENT provider on call Dr. Annalee Genta have discussed followup and management. Recommendation to start patient on a steroid taper regimen and to followup from 10-14 days for second hearing test. Today  patient was unable to hear 8,ooo Hz from her L ear and was able to hear above  15,000 Hz of her right ear. No tympanic membrane or ear canal abnormalities were observed in her exam.       Jimmie Molly, MD 08/24/11 [phone]

## 2011-11-26 ENCOUNTER — Emergency Department (INDEPENDENT_AMBULATORY_CARE_PROVIDER_SITE_OTHER)
Admission: EM | Admit: 2011-11-26 | Discharge: 2011-11-26 | Disposition: A | Discharge status: Home / Self Care | Payer: Self-pay | Source: Home / Self Care | Attending: Emergency Medicine | Admitting: Emergency Medicine

## 2011-11-26 ENCOUNTER — Encounter (HOSPITAL_COMMUNITY): Payer: Self-pay | Admitting: Emergency Medicine

## 2011-11-26 DIAGNOSIS — B338 Other specified viral diseases: Secondary | ICD-10-CM

## 2011-11-26 DIAGNOSIS — B341 Enterovirus infection, unspecified: Secondary | ICD-10-CM

## 2011-11-26 LAB — POCT RAPID STREP A: Streptococcus, Group A Screen (Direct): NEGATIVE

## 2011-11-26 MED ORDER — NAPROXEN 500 MG PO TABS: 500.0000 mg | ORAL_TABLET | Freq: Two times a day (BID) | ORAL | Status: DC

## 2011-11-26 MED ORDER — DIPHENOXYLATE-ATROPINE 2.5-0.025 MG PO TABS: 1.0000 | ORAL_TABLET | Freq: Four times a day (QID) | ORAL | Status: DC | PRN

## 2011-11-26 NOTE — ED Provider Notes (Signed)
Chief Complaint  Patient presents with  . Sore Throat    History of Present Illness:   Sherri [name] is a 26 year old female who presents today with a three-day history of sore throat. She's felt feverish, had diarrheal stools, headache, ear pressure, and neck pain. She denies any nasal congestion, rhinorrhea, cough, abdominal pain, nausea, or vomiting.  Review of Systems:  Other than as noted above, the patient denies any of the following symptoms. Systemic:  No fever, chills, sweats, fatigue, myalgias, headache, or anorexia. Eye:  No redness, pain or drainage. ENT:  No earache, ear congestion, nasal congestion, sneezing, rhinorrhea, sinus pressure, sinus pain, or post nasal drip. Lungs:  No cough, sputum production, wheezing, shortness of breath, or chest pain. GI:  No abdominal pain, nausea, vomiting, or diarrhea. Skin:  No rash or itching.  PMFSH:  Past medical history, family history, social history, meds, allergies, and nurse's notes were reviewed.  There is no known exposure to strep or mono.  No prior history of step or mono.  The patient denies use of tobacco.  Physical Exam:   Vital signs:  BP 127/74  Pulse 72  Temp 98.6 F (37 C) (Oral)  Resp 18  SpO2 97%  LMP 10/28/2011 General:  Alert, in no distress. Eye:  No conjunctival injection or drainage. Lids were normal. ENT:  TMs and canals were normal, without erythema or inflammation.  Nasal mucosa was clear and uncongested, without drainage.  Mucous membranes were moist.  Exam of pharynx reveals no erythema, exudate, or ulcerations.  There were no oral ulcerations or lesions. Neck:  Supple, no adenopathy, tenderness or mass. Lungs:  No respiratory distress.  Lungs were clear to auscultation, without wheezes, rales or rhonchi.  Breath sounds were clear and equal bilaterally.  Heart:  Regular rhythm, without gallops, murmers or rubs. Skin:  Clear, warm, and dry, without rash or lesions.  Labs:   Results for orders placed during the  hospital encounter of 11/26/11  POCT RAPID STREP A ([facility] ONLY)      Component Value Range   Streptococcus, Group A Screen (Direct) NEGATIVE  NEGATIVE    Assessment:  The encounter diagnosis was Enteroviral infection.  Plan:   1.  The following meds were prescribed:   New Prescriptions   DIPHENOXYLATE-ATROPINE (LOMOTIL) 2.5-0.025 MG PER TABLET    Take 1 tablet by mouth 4 (four) times daily as needed for diarrhea or loose stools.   NAPROXEN (NAPROSYN) 500 MG TABLET    Take 1 tablet (500 mg total) by mouth 2 (two) times daily.   2.  The patient was instructed in symptomatic care including hot saline gargles, throat lozenges, infectious precautions, and need to trade out toothbrush. Handouts were given. 3.  The patient was told to return if becoming worse in any way, if no better in 3 or 4 days, and given some red flag symptoms that would indicate earlier return.    Reuben Likes, MD 11/26/11 2232

## 2011-11-26 NOTE — ED Notes (Signed)
Pt c/o sore throat x3 days... Sx include diarrhea x2 days... Denies: fever, vomiting, nausea, coughing

## 2012-02-22 ENCOUNTER — Encounter (HOSPITAL_COMMUNITY): Payer: Self-pay | Admitting: Emergency Medicine

## 2012-02-22 ENCOUNTER — Emergency Department (INDEPENDENT_AMBULATORY_CARE_PROVIDER_SITE_OTHER)
Admission: EM | Admit: 2012-02-22 | Discharge: 2012-02-22 | Disposition: A | Discharge status: Home / Self Care | Payer: Self-pay | Source: Home / Self Care | Attending: Emergency Medicine | Admitting: Emergency Medicine

## 2012-02-22 DIAGNOSIS — J069 Acute upper respiratory infection, unspecified: Secondary | ICD-10-CM

## 2012-02-22 LAB — POCT RAPID STREP A: Streptococcus, Group A Screen (Direct): NEGATIVE

## 2012-02-22 MED ORDER — GUAIFENESIN-CODEINE 100-10 MG/5ML PO SYRP: 5.0000 mL | ORAL_SOLUTION | Freq: Three times a day (TID) | ORAL | Status: DC | PRN

## 2012-02-22 MED ORDER — IBUPROFEN 600 MG PO TABS: 600.0000 mg | ORAL_TABLET | Freq: Four times a day (QID) | ORAL | Status: DC | PRN

## 2012-02-22 NOTE — ED Provider Notes (Signed)
History     CSN: [id number]  Arrival date & time 02/22/12  1250   First MD Initiated Contact with Patient 02/22/12 1308      Chief Complaint  Patient presents with  . URI    (Consider location/radiation/quality/duration/timing/severity/associated sxs/prior treatment) HPI Comments: Patient is urgent care describing that since Saturday she's been having a cold with sweats and tactile fevers with cough and sinus congestion. And a sore throat. She also been having some mild lower back pain. She denies any urinary symptoms, nausea vomiting abdominal pain or shortness of breath. She does however describes a congested and runny nose some sinus congestion and coughing.  Patient is a 26 y.o. female presenting with URI. The history is provided by the patient.  URI The primary symptoms include fever, sore throat and cough. Primary symptoms do not include wheezing, abdominal pain, vomiting, myalgias or arthralgias. The current episode started 3 to 5 days ago. This is a new problem. The problem has not changed since onset. Symptoms associated with the illness include facial pain, sinus pressure and congestion. The illness is not associated with chills.    Past Medical History  Diagnosis Date  . Uterine fibroid     History reviewed. No pertinent past surgical history.  History reviewed. No pertinent family history.  History  Substance Use Topics  . Smoking status: Never Smoker   . Smokeless tobacco: Never Used  . Alcohol Use: No    OB History    Grav Para Term Preterm Abortions TAB SAB Ect Mult Living   0               Review of Systems  Constitutional: Positive for fever. Negative for chills.  HENT: Positive for congestion, sore throat and sinus pressure.   Respiratory: Positive for cough. Negative for wheezing.   Gastrointestinal: Negative for vomiting and abdominal pain.  Musculoskeletal: Negative for myalgias and arthralgias.    Allergies  Review of patient's allergies  indicates no known allergies.  Home Medications   Current Outpatient Rx  Name  Route  Sig  Dispense  Refill  . DROSPIRENONE-ETHINYL ESTRADIOL 3-0.02 MG PO TABS   Oral   Take 1 tablet by mouth daily.           Marland Kitchen DIPHENOXYLATE-ATROPINE 2.5-0.025 MG PO TABS   Oral   Take 1 tablet by mouth 4 (four) times daily as needed for diarrhea or loose stools.   16 tablet   0   . NAPROXEN 500 MG PO TABS   Oral   Take 1 tablet (500 mg total) by mouth 2 (two) times daily.   30 tablet   0   . NYQUIL MULTI-SYMPTOM PO   Oral   Take by mouth.           BP 139/74  Pulse 86  Temp 97 F (36.1 C) (Oral)  Resp 16  SpO2 97%  Physical Exam  Nursing note and vitals reviewed. Constitutional: Vital signs are normal. She appears well-developed and well-nourished.  Non-toxic appearance. She does not have a sickly appearance. She does not appear ill. No distress.  HENT:  Head: Normocephalic.  Right Ear: Tympanic membrane normal.  Left Ear: Tympanic membrane normal.  Nose: Rhinorrhea present.  Mouth/Throat: Uvula is midline, oropharynx is clear and moist and mucous membranes are normal. No oropharyngeal exudate.  Eyes: Conjunctivae normal are normal. Right eye exhibits no discharge. Left eye exhibits no discharge.  Neck: Neck supple. No JVD present.  Cardiovascular: Normal rate.  Exam  reveals no gallop and no friction rub.   No murmur heard. Pulmonary/Chest: Effort normal and breath sounds normal. She has no decreased breath sounds.  Abdominal: Soft. Normal appearance. There is no tenderness.  Musculoskeletal: Normal range of motion.  Lymphadenopathy:    She has no cervical adenopathy.  Neurological: She is alert.  Skin: No rash noted. No erythema.    ED Course  Procedures (including critical care time)   Labs Reviewed  POCT RAPID STREP A ([facility] ONLY)   No results found.   No diagnosis found.    MDM  Symptoms and exam consistent with a upper respiratory infection. We  are ruling out strep as a precautionary for focal pharyngeal erythema. Anticipate treating the patient symptomatically. This most likely viral process.        Jimmie Molly, MD 02/22/12 1339

## 2012-02-22 NOTE — ED Notes (Signed)
Reports cold sx since Saturday.  Reports coughing, sweating and sinus problems.  Patient admits to taking advil but no relief.

## 2012-04-27 ENCOUNTER — Encounter: Payer: Self-pay | Admitting: Obstetrics & Gynecology

## 2012-04-27 ENCOUNTER — Ambulatory Visit (INDEPENDENT_AMBULATORY_CARE_PROVIDER_SITE_OTHER): Payer: Self-pay | Admitting: Obstetrics & Gynecology

## 2012-04-27 VITALS — BP 139/93 | HR 92 | Temp 97.8°F | Ht 59.0 in | Wt 164.2 lb

## 2012-04-27 DIAGNOSIS — Z113 Encounter for screening for infections with a predominantly sexual mode of transmission: Secondary | ICD-10-CM | POA: Insufficient documentation

## 2012-04-27 DIAGNOSIS — Z3009 Encounter for other general counseling and advice on contraception: Secondary | ICD-10-CM

## 2012-04-27 DIAGNOSIS — A499 Bacterial infection, unspecified: Secondary | ICD-10-CM

## 2012-04-27 DIAGNOSIS — Z01419 Encounter for gynecological examination (general) (routine) without abnormal findings: Secondary | ICD-10-CM

## 2012-04-27 DIAGNOSIS — B9689 Other specified bacterial agents as the cause of diseases classified elsewhere: Secondary | ICD-10-CM

## 2012-04-27 DIAGNOSIS — N760 Acute vaginitis: Secondary | ICD-10-CM

## 2012-04-27 MED ORDER — DROSPIRENONE-ETHINYL ESTRADIOL 3-0.02 MG PO TABS: 1.0000 | ORAL_TABLET | Freq: Every day | ORAL | Status: DC

## 2012-04-27 MED ORDER — METRONIDAZOLE 500 MG PO TABS: 500.0000 mg | ORAL_TABLET | Freq: Two times a day (BID) | ORAL | Status: AC

## 2012-04-27 NOTE — Progress Notes (Signed)
Patient ID: Sherri [name], female   DOB: [birth year], 27 y.o.   MRN: [id number]  Chief Complaint  Patient presents with  . Gynecologic Exam    HPI [name] is a 27 y.o. female.  Patient's last menstrual period was 04/21/2012. G0P0 regular menses on OCP, wants refill. Abstained since she ran out of pills last month.  HPI  Past Medical History  Diagnosis Date  . Uterine fibroid     No past surgical history on file.  No family history on file.  Social History History  Substance Use Topics  . Smoking status: Never Smoker   . Smokeless tobacco: Never Used  . Alcohol Use: No    No Known Allergies  Current Outpatient Prescriptions  Medication Sig Dispense Refill  . drospirenone-ethinyl estradiol (YAZ,GIANVI,LORYNA) 3-0.02 MG tablet Take 1 tablet by mouth daily.  1 Package  12  . metroNIDAZOLE (FLAGYL) 500 MG tablet Take 1 tablet (500 mg total) by mouth 2 (two) times daily with a meal.  14 tablet  0   No current facility-administered medications for this visit.    Review of Systems Review of Systems  Constitutional: Negative.   Respiratory: Negative.   Gastrointestinal: Negative.   Genitourinary: Positive for dysuria and vaginal discharge. Negative for menstrual problem.       Vaginal odor    Blood pressure 139/93, pulse 92, temperature 97.8 F (36.6 C), temperature source Oral, height 4\' 11"  (1.499 m), weight 164 lb 3.2 oz (74.481 kg), last menstrual period 04/21/2012.  Physical Exam Physical Exam  Nursing note and vitals reviewed. Constitutional: She is oriented to person, place, and time. She appears well-developed and well-nourished. No distress.  Cardiovascular: Normal rate.   Pulmonary/Chest: Effort normal. No respiratory distress.  Breasts no mass, not tender  Abdominal: Soft. She exhibits no mass. There is no tenderness.  Genitourinary:  EBUS and vagina-odor and white discharge. Affirm test sent. Pap deferred. Uterus nl, no masses or tenderness   Neurological: She is alert and oriented to person, place, and time.  Skin: Skin is warm and dry.  Psychiatric: She has a normal mood and affect. Her behavior is normal.    Data Reviewed Pap 01/2011 nl  Assessment    Suspect BV. Normal Gyn exam o/w, on OCP     Plan    Refill OCP Flagyl 500 mg BID 7 days [place] self exam Yearly f/u        Woodie Degraffenreid 04/27/2012, 2:14 PM

## 2012-04-27 NOTE — Patient Instructions (Signed)

## 2012-04-27 NOTE — Addendum Note (Signed)
Addended by: Kathee Delton on: 04/27/2012 04:54 PM   Modules accepted: Orders

## 2012-04-28 ENCOUNTER — Emergency Department (HOSPITAL_COMMUNITY)
Admission: EM | Admit: 2012-04-28 | Discharge: 2012-04-28 | Disposition: A | Discharge status: Home / Self Care | Payer: Self-pay | Attending: Emergency Medicine | Admitting: Emergency Medicine

## 2012-04-28 ENCOUNTER — Emergency Department (HOSPITAL_COMMUNITY): Payer: Self-pay

## 2012-04-28 ENCOUNTER — Encounter (HOSPITAL_COMMUNITY): Payer: Self-pay

## 2012-04-28 DIAGNOSIS — R059 Cough, unspecified: Secondary | ICD-10-CM | POA: Insufficient documentation

## 2012-04-28 DIAGNOSIS — R05 Cough: Secondary | ICD-10-CM | POA: Insufficient documentation

## 2012-04-28 DIAGNOSIS — J069 Acute upper respiratory infection, unspecified: Secondary | ICD-10-CM | POA: Insufficient documentation

## 2012-04-28 DIAGNOSIS — J029 Acute pharyngitis, unspecified: Secondary | ICD-10-CM | POA: Insufficient documentation

## 2012-04-28 DIAGNOSIS — Z8742 Personal history of other diseases of the female genital tract: Secondary | ICD-10-CM | POA: Insufficient documentation

## 2012-04-28 DIAGNOSIS — Z792 Long term (current) use of antibiotics: Secondary | ICD-10-CM | POA: Insufficient documentation

## 2012-04-28 DIAGNOSIS — H9209 Otalgia, unspecified ear: Secondary | ICD-10-CM | POA: Insufficient documentation

## 2012-04-28 LAB — RAPID STREP SCREEN (MED CTR MEBANE ONLY): Streptococcus, Group A Screen (Direct): NEGATIVE

## 2012-04-28 MED ORDER — IBUPROFEN 800 MG PO TABS
800.0000 mg | ORAL_TABLET | Freq: Once | ORAL | Status: AC
  Administered 2012-04-28: 800 mg via ORAL
  Filled 2012-04-28: qty 1

## 2012-04-28 NOTE — Discharge Instructions (Signed)

## 2012-04-28 NOTE — ED Notes (Signed)
Pt c/o nasal congestion, sore throat, (R) ear pain and dry cough starting Tuesday. Pt reports taking Mucinex OTC w/no relief, pt states her nasal congestion is yellow in color.

## 2012-04-28 NOTE — ED Provider Notes (Signed)
History     CSN: [id number]  Arrival date & time 04/28/12  0613   First MD Initiated Contact with Patient 04/28/12 0700      Chief Complaint  Patient presents with  . Nasal Congestion  . Sore Throat    (Consider location/radiation/quality/duration/timing/severity/associated sxs/prior treatment) HPI Comments: Patient presents with a two-day history of nasal congestion, sore throat, ear pain and dry cough. She taking Mucinex without relief. She reports yellow nasal congestion. Denies fever and has had chills. No nausea, vomiting or abdominal pain. Endorses sick contacts. Denies smoke exposure. No chest pain or shortness of breath. Good by mouth intake and urine output. Seen by gynecologist yesterday and treated for BV.  The history is provided by the patient.    Past Medical History  Diagnosis Date  . Uterine fibroid     History reviewed. No pertinent past surgical history.  History reviewed. No pertinent family history.  History  Substance Use Topics  . Smoking status: Never Smoker   . Smokeless tobacco: Never Used  . Alcohol Use: No    OB History   Grav Para Term Preterm Abortions TAB SAB Ect Mult Living   0               Review of Systems  Constitutional: Positive for activity change and appetite change. Negative for fever.  HENT: Positive for congestion, sore throat and rhinorrhea.   Respiratory: Positive for cough.   Gastrointestinal: Negative for nausea, vomiting and abdominal pain.  Genitourinary: Negative for dysuria, hematuria, vaginal bleeding and vaginal discharge.  Musculoskeletal: Negative for back pain.  Skin: Negative for wound.  Neurological: Negative for dizziness, weakness and headaches.  A complete 10 system review of systems was obtained and all systems are negative except as noted in the HPI and PMH.    Allergies  Review of patient's allergies indicates no known allergies.  Home Medications   Current Outpatient Rx  Name  Route  Sig   Dispense  Refill  . drospirenone-ethinyl estradiol (YAZ,GIANVI,LORYNA) 3-0.02 MG tablet   Oral   Take 1 tablet by mouth daily.   1 Package   12   . metroNIDAZOLE (FLAGYL) 500 MG tablet   Oral   Take 1 tablet (500 mg total) by mouth 2 (two) times daily with a meal.   14 tablet   0     BP 124/70  Pulse 89  Temp(Src) 98.1 F (36.7 C)  Resp 16  SpO2 100%  LMP 04/21/2012  Physical Exam  Constitutional: She is oriented to person, place, and time. She appears well-developed and well-nourished. No distress.  Laughing and joking in the room  HENT:  Head: Normocephalic and atraumatic.  Right Ear: External ear normal.  Left Ear: External ear normal.  Mouth/Throat: Oropharynx is clear and moist. No oropharyngeal exudate.  Eyes: Conjunctivae and EOM are normal. Pupils are equal, round, and reactive to light.  Neck: Normal range of motion. Neck supple.  Cardiovascular: Normal rate, regular rhythm and normal heart sounds.   Pulmonary/Chest: Effort normal and breath sounds normal. No respiratory distress. She has no wheezes.  Abdominal: Soft. Bowel sounds are normal. There is no tenderness. There is no rebound and no guarding.  Musculoskeletal: Normal range of motion. She exhibits no edema and no tenderness.  Lymphadenopathy:    She has no cervical adenopathy.  Neurological: She is alert and oriented to person, place, and time. No cranial nerve deficit. She exhibits normal muscle tone. Coordination normal.  Skin: Skin is warm.  ED Course  Procedures (including critical care time)  Labs Reviewed  RAPID STREP SCREEN   Dg Chest 2 View  04/28/2012  *RADIOLOGY REPORT*  Clinical Data: Shortness of breath.  CHEST - 2 VIEW  Comparison: Chest x-ray 03/30/2009.  Findings: Lung volumes are normal.  No consolidative airspace disease.  No pleural effusions.  No pneumothorax.  No pulmonary nodule or mass noted.  Pulmonary vasculature and the cardiomediastinal silhouette are within normal  limits.  IMPRESSION: 1. No radiographic evidence of acute cardiopulmonary disease.   Original Report Authenticated By: Trudie Reed, M.D.      1. Upper respiratory infection       MDM  2 days of upper respiratory symptoms with congestion, sore throat and dry cough. Vital stable, no distress, lungs clear  Chest x-ray negative. Supportive care for URI. Patient appears well and nontoxic.     Glynn Octave, MD 04/28/12 [phone]

## 2012-05-26 LAB — CYTOLOGY - PAP: Pap Smear: NEGATIVE

## 2012-09-21 ENCOUNTER — Ambulatory Visit (INDEPENDENT_AMBULATORY_CARE_PROVIDER_SITE_OTHER): Payer: Medicaid Other | Admitting: Obstetrics & Gynecology

## 2012-09-21 ENCOUNTER — Other Ambulatory Visit (HOSPITAL_COMMUNITY)
Admission: RE | Admit: 2012-09-21 | Discharge: 2012-09-21 | Disposition: A | Discharge status: Home / Self Care | Payer: Medicaid Other | Source: Ambulatory Visit | Attending: Obstetrics & Gynecology | Admitting: Obstetrics & Gynecology

## 2012-09-21 ENCOUNTER — Encounter: Payer: Self-pay | Admitting: Obstetrics & Gynecology

## 2012-09-21 VITALS — BP 115/76 | HR 76 | Temp 97.0°F | Resp 20 | Ht 59.0 in | Wt 161.7 lb

## 2012-09-21 DIAGNOSIS — Z113 Encounter for screening for infections with a predominantly sexual mode of transmission: Secondary | ICD-10-CM | POA: Insufficient documentation

## 2012-09-21 DIAGNOSIS — Z01419 Encounter for gynecological examination (general) (routine) without abnormal findings: Secondary | ICD-10-CM | POA: Insufficient documentation

## 2012-09-21 DIAGNOSIS — Z711 Person with feared health complaint in whom no diagnosis is made: Secondary | ICD-10-CM

## 2012-09-21 LAB — CBC WITH DIFFERENTIAL/PLATELET
Basophils Absolute: 0 10*3/uL (ref 0.0–0.1)
Basophils Relative: 0 % (ref 0–1)
Eosinophils Absolute: 0.2 10*3/uL (ref 0.0–0.7)
Eosinophils Relative: 3 % (ref 0–5)
HCT: 37.3 % (ref 36.0–46.0)
Hemoglobin: 12.5 g/dL (ref 12.0–15.0)
Lymphocytes Relative: 43 % (ref 12–46)
Lymphs Abs: 2.8 10*3/uL (ref 0.7–4.0)
MCH: 29 pg (ref 26.0–34.0)
MCHC: 33.5 g/dL (ref 30.0–36.0)
MCV: 86.5 fL (ref 78.0–100.0)
Monocytes Absolute: 0.5 10*3/uL (ref 0.1–1.0)
Monocytes Relative: 7 % (ref 3–12)
Neutro Abs: 3.2 10*3/uL (ref 1.7–7.7)
Neutrophils Relative %: 47 % (ref 43–77)
Platelets: 288 10*3/uL (ref 150–400)
RBC: 4.31 MIL/uL (ref 3.87–5.11)
RDW: 15.3 % (ref 11.5–15.5)
WBC: 6.7 10*3/uL (ref 4.0–10.5)

## 2012-09-21 LAB — POCT URINALYSIS DIP (DEVICE)
Bilirubin Urine: NEGATIVE
Glucose, UA: NEGATIVE mg/dL
Ketones, ur: NEGATIVE mg/dL
Nitrite: NEGATIVE
Protein, ur: 100 mg/dL — AB
Specific Gravity, Urine: >=1.03 (ref 1.005–1.030)
Urobilinogen, UA: 1 mg/dL (ref 0.0–1.0)
pH: 6 (ref 5.0–8.0)

## 2012-09-21 NOTE — Progress Notes (Signed)
Pt states she has been having vaginal discharge for 1 month- no color but has odor.

## 2012-09-21 NOTE — Addendum Note (Signed)
Addended by: Faythe Casa on: 09/21/2012 05:25 PM   Modules accepted: Orders

## 2012-09-21 NOTE — Progress Notes (Addendum)
  Subjective:    Patient ID: Sherri [name], female    DOB: 1985/10/02, 27 y.o.   MRN: [id number]  Vaginal Discharge   Pt here for concern of STI  States that for approx 1 month she has had thick white vaginal discharge, vaginal itching, dyspareunia worse with deep penetration and BL pelvic pain. She described her pain as intermittent and states that it bothers her daily.   She does not have any new partners and has had 2 partners in the last 6 months. She takes OCPs for contraception and does not use condoms.   She has had previous gardnella Her LMP is today  Review of Systems Denies fevers,  + chills and sweats intermittently.     Objective:   Physical Exam  Physical Exam  Gen: NAD, alert, cooperative with exam HEENT: NCAT Abd: SNTND, BS present, no guarding or organomegaly GU: vagina well rugated, cervix without significant discharge, gross blood apparent in the vaginal vault, BI manual with mild tenderness with palpation of cervix, no abnormalities of the adnexa or uterus appreciated.  Neuro: Alert and oriented, No gross deficits     Assessment & Plan:   Concern for UTI, vaginal discharge GCC, HIV, RPR and CBC performed to evaluate for PID.  UA with trace LEU and large blood, sent for culture Wet prep to r/o candida Previous pap negative 2 years ago.  Follow up 1 year for pap and annual if labs negative and symptoms don't worsen.   Murtis Sink, MD [facility] Resident, PGY-2 09/21/2012, 2:28 PM   I agree with note, saw patient with resident  Adam Phenix, MD

## 2012-09-21 NOTE — Patient Instructions (Signed)
We will call you if any of your results turn out positive.   It was great to meet you.  Come back as needed  Safe Sex Safe sex is about reducing the risk of giving or getting a sexually transmitted disease (STD). STDs are spread through sexual contact involving the genitals, mouth, or rectum. Some STDS can be cured and others cannot. Safe sex can also prevent unintended pregnancies.  SAFE SEX PRACTICES  Limit your sexual activity to only one partner who is only having sex with you.  Talk to your partner about their past partners, past STDs, and drug use.  Use a condom every time you have sexual intercourse. This includes vaginal, oral, and anal sexual activity. Both females and males should wear condoms during oral sex. Only use latex or polyurethane condoms and water-based lubricants. Petroleum-based lubricants or oils used to lubricate a condom will weaken the condom and increase the chance that it will break. The condom should be in place from the beginning to the end of sexual activity. Wearing a condom reduces, but does not completely eliminate, your risk of getting or giving a STD. STDs can be spread by contact with skin of surrounding areas.  Get vaccinated for hepatitis B and HPV.  Avoid alcohol and recreational drugs which can affect your judgement. You may forget to use a condom or participate in high-risk sex.  For females, avoid douching after sexual intercourse. Douching can spread an infection farther into the reproductive tract.  Check your body for signs of sores, blisters, rashes, or unusual discharge. See your caregiver if you notice any of these signs.  Avoid sexual contact if you have symptoms of an infection or are being treated for an STD. If you or your partner has herpes, avoid sexual contact when blisters are present. Use condoms at all other times.  See your caregiver for regular screenings, examinations, and tests for STDs. Before having sex with a new partner,  each of you should be screened for STDs and talk about the results with your partner. BENEFITS OF SAFE SEX   There is less of a chance of getting or giving an STD.  You can prevent unwanted or unintended pregnancies.  By discussing safer sex concerns with your partner, you may increase feelings of intimacy, comfort, trust, and honesty between the both of you. Document Released: 03/26/2004 Document Revised: 11/11/2011 Document Reviewed: 08/10/2011 [facility] Patient Information 2014 [place], [place].

## 2012-09-22 LAB — WET PREP, GENITAL
Clue Cells Wet Prep HPF POC: NONE SEEN
Trich, Wet Prep: NONE SEEN
WBC, Wet Prep HPF POC: NONE SEEN
Yeast Wet Prep HPF POC: NONE SEEN

## 2012-09-22 LAB — RPR

## 2012-09-22 LAB — HIV ANTIBODY (ROUTINE TESTING W REFLEX): HIV: NONREACTIVE

## 2012-09-23 LAB — URINE CULTURE

## 2013-03-02 ENCOUNTER — Encounter (HOSPITAL_COMMUNITY): Payer: Self-pay | Admitting: Emergency Medicine

## 2013-03-02 ENCOUNTER — Emergency Department (HOSPITAL_COMMUNITY)
Admission: EM | Admit: 2013-03-02 | Discharge: 2013-03-02 | Disposition: A | Discharge status: Home / Self Care | Payer: Medicaid Other | Attending: Emergency Medicine | Admitting: Emergency Medicine

## 2013-03-02 DIAGNOSIS — J029 Acute pharyngitis, unspecified: Secondary | ICD-10-CM | POA: Insufficient documentation

## 2013-03-02 DIAGNOSIS — Z8742 Personal history of other diseases of the female genital tract: Secondary | ICD-10-CM | POA: Insufficient documentation

## 2013-03-02 MED ORDER — DEXAMETHASONE SODIUM PHOSPHATE 10 MG/ML IJ SOLN
10.0000 mg | Freq: Once | INTRAMUSCULAR | Status: AC
  Administered 2013-03-02: 10 mg via INTRAMUSCULAR
  Filled 2013-03-02: qty 1

## 2013-03-02 MED ORDER — IBUPROFEN 800 MG PO TABS
800.0000 mg | ORAL_TABLET | Freq: Once | ORAL | Status: AC
  Administered 2013-03-02: 800 mg via ORAL
  Filled 2013-03-02: qty 1

## 2013-03-02 NOTE — ED Notes (Signed)
C/o sore throat since yesterday.  States she is able to drink liquids.

## 2013-03-02 NOTE — ED Provider Notes (Signed)
Medical screening examination/treatment/procedure(s) were performed by non-physician practitioner and as supervising physician I was immediately available for consultation/collaboration.    Tyrell Seifer, MD 03/02/13 1003 

## 2013-03-02 NOTE — ED Provider Notes (Signed)
CSN: [id number]     Arrival date & time 03/02/13  0300 History   First MD Initiated Contact with Patient 03/02/13 0501     Chief Complaint  Patient presents with  . Sore Throat   (Consider location/radiation/quality/duration/timing/severity/associated sxs/prior Treatment) HPI Comments: Pt states that it feels swollen under her tongue and with swallowing:no fever  Patient is a 28 y.o. female presenting with pharyngitis. The history is provided by the patient. No language interpreter was used.  Sore Throat This is a new problem. The current episode started today. The problem occurs constantly. The problem has been unchanged. Associated symptoms include a sore throat. Pertinent negatives include no coughing, fever, neck pain or swollen glands. The symptoms are aggravated by swallowing. She has tried nothing for the symptoms.    Past Medical History  Diagnosis Date  . Uterine fibroid    History reviewed. No pertinent past surgical history. No family history on file. History  Substance Use Topics  . Smoking status: Never Smoker   . Smokeless tobacco: Never Used  . Alcohol Use: Yes     Comment: occasional   OB History   Grav Para Term Preterm Abortions TAB SAB Ect Mult Living   0              Review of Systems  Constitutional: Negative for fever.  HENT: Positive for sore throat.   Respiratory: Negative for cough.   Cardiovascular: Negative.   Musculoskeletal: Negative for neck pain.    Allergies  Review of patient's allergies indicates no known allergies.  Home Medications   Current Outpatient Rx  Name  Route  Sig  Dispense  Refill  . acetaminophen (TYLENOL) 325 MG tablet   Oral   Take 650 mg by mouth every 6 (six) hours as needed for mild pain.          BP 131/86  Pulse 79  Temp(Src) 98 F (36.7 C) (Oral)  Resp 18  Wt 165 lb 3 oz (74.929 kg)  SpO2 97%  LMP 02/23/2013 Physical Exam  Constitutional: She is oriented to person, place, and time. She appears  well-developed and well-nourished.  HENT:  Head: Normocephalic and atraumatic.  Right Ear: External ear normal.  Left Ear: External ear normal.  Mouth/Throat: Posterior oropharyngeal erythema present.  Cardiovascular: Normal rate.   Pulmonary/Chest: Effort normal and breath sounds normal.  Musculoskeletal: Normal range of motion.  Neurological: She is alert and oriented to person, place, and time.  Skin: Skin is warm and dry.  Psychiatric: She has a normal mood and affect.    ED Course  Procedures (including critical care time) Labs Review Labs Reviewed - No data to display Imaging Review No results found.  EKG Interpretation   None       MDM   1. Pharyngitis    Pt given shot of decadron:doubt need for antibiotics:likely viral    Glendell Docker, NP 03/02/13 [phone]

## 2013-03-02 NOTE — Discharge Instructions (Signed)
Antibiotic Nonuse   Your caregiver felt that the infection or problem was not one that would be helped with an antibiotic.  Infections may be caused by viruses or bacteria. Only a caregiver can tell which one of these is the likely cause of an illness. A cold is the most common cause of infection in both adults and children. A cold is a virus. Antibiotic treatment will have no effect on a viral infection. Viruses can lead to many lost days of work caring for sick children and many missed days of school. Children may catch as many as 10 "colds" or "flus" per year during which they can be tearful, cranky, and uncomfortable. The goal of treating a virus is aimed at keeping the ill person comfortable.  Antibiotics are medications used to help the body fight bacterial infections. There are relatively few types of bacteria that cause infections but there are hundreds of viruses. While both viruses and bacteria cause infection they are very different types of germs. A viral infection will typically go away by itself within 7 to 10 days. Bacterial infections may spread or get worse without antibiotic treatment.  Examples of bacterial infections are:   Sore throats (like strep throat or tonsillitis).   Infection in the lung (pneumonia).   Ear and skin infections.  Examples of viral infections are:   Colds or flus.   Most coughs and bronchitis.   Sore throats not caused by Strep.   Runny noses.  It is often best not to take an antibiotic when a viral infection is the cause of the problem. Antibiotics can kill off the helpful bacteria that we have inside our body and allow harmful bacteria to start growing. Antibiotics can cause side effects such as allergies, nausea, and diarrhea without helping to improve the symptoms of the viral infection. Additionally, repeated uses of antibiotics can cause bacteria inside of our body to become resistant. That resistance can be passed onto harmful bacterial. The next time you have  an infection it may be harder to treat if antibiotics are used when they are not needed. Not treating with antibiotics allows our own immune system to develop and take care of infections more efficiently. Also, antibiotics will work better for us when they are prescribed for bacterial infections.  Treatments for a child that is ill may include:   Give extra fluids throughout the day to stay hydrated.   Get plenty of rest.   Only give your child over-the-counter or prescription medicines for pain, discomfort, or fever as directed by your caregiver.   The use of a cool mist humidifier may help stuffy noses.   Cold medications if suggested by your caregiver.  Your caregiver may decide to start you on an antibiotic if:   The problem you were seen for today continues for a longer length of time than expected.   You develop a secondary bacterial infection.  SEEK MEDICAL CARE IF:   Fever lasts longer than 5 days.   Symptoms continue to get worse after 5 to 7 days or become severe.   Difficulty in breathing develops.   Signs of dehydration develop (poor drinking, rare urinating, dark colored urine).   Changes in behavior or worsening tiredness (listlessness or lethargy).  Document Released: 04/27/2001 Document Revised: 05/11/2011 Document Reviewed: 10/24/2008  [employer] Patient Information 2014 [employer].

## 2013-10-10 ENCOUNTER — Emergency Department (HOSPITAL_COMMUNITY)
Admission: EM | Admit: 2013-10-10 | Discharge: 2013-10-11 | Disposition: A | Discharge status: Home / Self Care | Payer: Medicaid Other | Attending: Emergency Medicine | Admitting: Emergency Medicine

## 2013-10-10 ENCOUNTER — Encounter (HOSPITAL_COMMUNITY): Payer: Self-pay | Admitting: Emergency Medicine

## 2013-10-10 DIAGNOSIS — Z8742 Personal history of other diseases of the female genital tract: Secondary | ICD-10-CM | POA: Insufficient documentation

## 2013-10-10 DIAGNOSIS — R197 Diarrhea, unspecified: Secondary | ICD-10-CM | POA: Insufficient documentation

## 2013-10-10 DIAGNOSIS — R5381 Other malaise: Secondary | ICD-10-CM | POA: Insufficient documentation

## 2013-10-10 DIAGNOSIS — R509 Fever, unspecified: Secondary | ICD-10-CM | POA: Insufficient documentation

## 2013-10-10 DIAGNOSIS — R52 Pain, unspecified: Secondary | ICD-10-CM | POA: Insufficient documentation

## 2013-10-10 DIAGNOSIS — R109 Unspecified abdominal pain: Secondary | ICD-10-CM | POA: Insufficient documentation

## 2013-10-10 DIAGNOSIS — R1110 Vomiting, unspecified: Secondary | ICD-10-CM | POA: Insufficient documentation

## 2013-10-10 DIAGNOSIS — R300 Dysuria: Secondary | ICD-10-CM | POA: Insufficient documentation

## 2013-10-10 DIAGNOSIS — Z3202 Encounter for pregnancy test, result negative: Secondary | ICD-10-CM | POA: Insufficient documentation

## 2013-10-10 DIAGNOSIS — R5383 Other fatigue: Secondary | ICD-10-CM

## 2013-10-10 LAB — COMPREHENSIVE METABOLIC PANEL
ALT: 9 U/L (ref 0–35)
AST: 15 U/L (ref 0–37)
Albumin: 3.8 g/dL (ref 3.5–5.2)
Alkaline Phosphatase: 72 U/L (ref 39–117)
Anion gap: 10 (ref 5–15)
BUN: 10 mg/dL (ref 6–23)
CO2: 25 mEq/L (ref 19–32)
Calcium: 9.5 mg/dL (ref 8.4–10.5)
Chloride: 105 mEq/L (ref 96–112)
Creatinine, Ser: 0.75 mg/dL (ref 0.50–1.10)
GFR calc Af Amer: >90 mL/min (ref >90)
GFR calc non Af Amer: >90 mL/min (ref >90)
Glucose, Bld: 90 mg/dL (ref 70–99)
Potassium: 3.8 mEq/L (ref 3.7–5.3)
Sodium: 140 mEq/L (ref 137–147)
Total Bilirubin: 0.2 mg/dL — ABNORMAL LOW (ref 0.3–1.2)
Total Protein: 7.3 g/dL (ref 6.0–8.3)

## 2013-10-10 LAB — CBC WITH DIFFERENTIAL/PLATELET
Basophils Absolute: 0 10*3/uL (ref 0.0–0.1)
Basophils Relative: 0 % (ref 0–1)
Eosinophils Absolute: 0.1 10*3/uL (ref 0.0–0.7)
Eosinophils Relative: 2 % (ref 0–5)
HCT: 36.7 % (ref 36.0–46.0)
Hemoglobin: 12 g/dL (ref 12.0–15.0)
Lymphocytes Relative: 36 % (ref 12–46)
Lymphs Abs: 2.9 10*3/uL (ref 0.7–4.0)
MCH: 29.1 pg (ref 26.0–34.0)
MCHC: 32.7 g/dL (ref 30.0–36.0)
MCV: 88.9 fL (ref 78.0–100.0)
Monocytes Absolute: 0.7 10*3/uL (ref 0.1–1.0)
Monocytes Relative: 9 % (ref 3–12)
Neutro Abs: 4.3 10*3/uL (ref 1.7–7.7)
Neutrophils Relative %: 53 % (ref 43–77)
Platelets: 275 10*3/uL (ref 150–400)
RBC: 4.13 MIL/uL (ref 3.87–5.11)
RDW: 15 % (ref 11.5–15.5)
WBC: 8 10*3/uL (ref 4.0–10.5)

## 2013-10-10 LAB — URINALYSIS, ROUTINE W REFLEX MICROSCOPIC
Bilirubin Urine: NEGATIVE
Glucose, UA: NEGATIVE mg/dL
Hgb urine dipstick: NEGATIVE
Ketones, ur: NEGATIVE mg/dL
Leukocytes, UA: NEGATIVE
Nitrite: NEGATIVE
Protein, ur: NEGATIVE mg/dL
Specific Gravity, Urine: 1.012 (ref 1.005–1.030)
Urobilinogen, UA: 0.2 mg/dL (ref 0.0–1.0)
pH: 7 (ref 5.0–8.0)

## 2013-10-10 LAB — PREGNANCY, URINE: Preg Test, Ur: NEGATIVE

## 2013-10-10 NOTE — ED Notes (Signed)
Pt. reports generalized weakness , emesis , diarrhea , body aches , chills and low grade fever onset 4 days ago with dysuria .

## 2013-10-11 ENCOUNTER — Emergency Department (HOSPITAL_COMMUNITY): Payer: Medicaid Other

## 2013-10-11 ENCOUNTER — Encounter (HOSPITAL_COMMUNITY): Payer: Self-pay | Admitting: Radiology

## 2013-10-11 MED ORDER — IOHEXOL 300 MG/ML  SOLN
80.0000 mL | Freq: Once | INTRAMUSCULAR | Status: AC | PRN
  Administered 2013-10-11: 80 mL via INTRAVENOUS

## 2013-10-11 MED ORDER — HYDROMORPHONE HCL PF 1 MG/ML IJ SOLN
INTRAMUSCULAR | Status: AC
  Filled 2013-10-11: qty 1

## 2013-10-11 MED ORDER — ONDANSETRON HCL 4 MG/2ML IJ SOLN
INTRAMUSCULAR | Status: AC
  Filled 2013-10-11: qty 2

## 2013-10-11 MED ORDER — PROMETHAZINE HCL 25 MG PO TABS: 25.0000 mg | ORAL_TABLET | Freq: Four times a day (QID) | ORAL | Status: DC | PRN

## 2013-10-11 NOTE — ED Notes (Signed)
MD at bedside. 

## 2013-10-11 NOTE — ED Notes (Signed)
System downtime. See paper charting.

## 2013-10-11 NOTE — Discharge Instructions (Signed)
As we discussed is possible he may have a dysfunctional gallbladder. Would recommend followup of with the health department for arrangements for HIDA scan. Return for any new or worse symptoms. Work note provided.

## 2013-10-11 NOTE — ED Notes (Signed)
Pt Is back from CT   

## 2013-10-12 ENCOUNTER — Encounter: Payer: Self-pay | Admitting: Gastroenterology

## 2013-11-05 ENCOUNTER — Emergency Department (HOSPITAL_COMMUNITY)
Admission: EM | Admit: 2013-11-05 | Discharge: 2013-11-05 | Disposition: A | Discharge status: Home / Self Care | Payer: Medicaid Other | Attending: Emergency Medicine | Admitting: Emergency Medicine

## 2013-11-05 ENCOUNTER — Encounter (HOSPITAL_COMMUNITY): Payer: Self-pay | Admitting: Emergency Medicine

## 2013-11-05 ENCOUNTER — Emergency Department (HOSPITAL_COMMUNITY): Payer: Medicaid Other

## 2013-11-05 DIAGNOSIS — N760 Acute vaginitis: Secondary | ICD-10-CM | POA: Insufficient documentation

## 2013-11-05 DIAGNOSIS — Z3490 Encounter for supervision of normal pregnancy, unspecified, unspecified trimester: Secondary | ICD-10-CM

## 2013-11-05 DIAGNOSIS — B373 Candidiasis of vulva and vagina: Secondary | ICD-10-CM

## 2013-11-05 DIAGNOSIS — A499 Bacterial infection, unspecified: Secondary | ICD-10-CM | POA: Insufficient documentation

## 2013-11-05 DIAGNOSIS — N644 Mastodynia: Secondary | ICD-10-CM | POA: Insufficient documentation

## 2013-11-05 DIAGNOSIS — R109 Unspecified abdominal pain: Secondary | ICD-10-CM

## 2013-11-05 DIAGNOSIS — O26899 Other specified pregnancy related conditions, unspecified trimester: Secondary | ICD-10-CM

## 2013-11-05 DIAGNOSIS — B9689 Other specified bacterial agents as the cause of diseases classified elsewhere: Secondary | ICD-10-CM | POA: Insufficient documentation

## 2013-11-05 DIAGNOSIS — O2390 Unspecified genitourinary tract infection in pregnancy, unspecified trimester: Secondary | ICD-10-CM | POA: Insufficient documentation

## 2013-11-05 DIAGNOSIS — B3731 Acute candidiasis of vulva and vagina: Secondary | ICD-10-CM

## 2013-11-05 DIAGNOSIS — O98819 Other maternal infectious and parasitic diseases complicating pregnancy, unspecified trimester: Secondary | ICD-10-CM | POA: Insufficient documentation

## 2013-11-05 DIAGNOSIS — Z79899 Other long term (current) drug therapy: Secondary | ICD-10-CM | POA: Insufficient documentation

## 2013-11-05 DIAGNOSIS — Z3491 Encounter for supervision of normal pregnancy, unspecified, first trimester: Secondary | ICD-10-CM

## 2013-11-05 DIAGNOSIS — O9989 Other specified diseases and conditions complicating pregnancy, childbirth and the puerperium: Secondary | ICD-10-CM | POA: Insufficient documentation

## 2013-11-05 LAB — URINALYSIS, ROUTINE W REFLEX MICROSCOPIC
Bilirubin Urine: NEGATIVE
Glucose, UA: NEGATIVE mg/dL
Hgb urine dipstick: NEGATIVE
Ketones, ur: >80 mg/dL — AB
Nitrite: NEGATIVE
Protein, ur: NEGATIVE mg/dL
Specific Gravity, Urine: 1.026 (ref 1.005–1.030)
Urobilinogen, UA: 1 mg/dL (ref 0.0–1.0)
pH: 6 (ref 5.0–8.0)

## 2013-11-05 LAB — CBC WITH DIFFERENTIAL/PLATELET
Basophils Absolute: 0 10*3/uL (ref 0.0–0.1)
Basophils Relative: 0 % (ref 0–1)
Eosinophils Absolute: 0.1 10*3/uL (ref 0.0–0.7)
Eosinophils Relative: 1 % (ref 0–5)
HCT: 37.7 % (ref 36.0–46.0)
Hemoglobin: 12.4 g/dL (ref 12.0–15.0)
Lymphocytes Relative: 33 % (ref 12–46)
Lymphs Abs: 2.6 10*3/uL (ref 0.7–4.0)
MCH: 29.5 pg (ref 26.0–34.0)
MCHC: 32.9 g/dL (ref 30.0–36.0)
MCV: 89.5 fL (ref 78.0–100.0)
Monocytes Absolute: 0.6 10*3/uL (ref 0.1–1.0)
Monocytes Relative: 7 % (ref 3–12)
Neutro Abs: 4.8 10*3/uL (ref 1.7–7.7)
Neutrophils Relative %: 59 % (ref 43–77)
Platelets: 280 10*3/uL (ref 150–400)
RBC: 4.21 MIL/uL (ref 3.87–5.11)
RDW: 15.1 % (ref 11.5–15.5)
WBC: 8 10*3/uL (ref 4.0–10.5)

## 2013-11-05 LAB — WET PREP, GENITAL: Trich, Wet Prep: NONE SEEN

## 2013-11-05 LAB — COMPREHENSIVE METABOLIC PANEL
ALT: 25 U/L (ref 0–35)
AST: 22 U/L (ref 0–37)
Albumin: 4.2 g/dL (ref 3.5–5.2)
Alkaline Phosphatase: 62 U/L (ref 39–117)
Anion gap: 15 (ref 5–15)
BUN: 9 mg/dL (ref 6–23)
CO2: 20 mEq/L (ref 19–32)
Calcium: 9.3 mg/dL (ref 8.4–10.5)
Chloride: 104 mEq/L (ref 96–112)
Creatinine, Ser: 0.68 mg/dL (ref 0.50–1.10)
GFR calc Af Amer: >90 mL/min (ref >90)
GFR calc non Af Amer: >90 mL/min (ref >90)
Glucose, Bld: 82 mg/dL (ref 70–99)
Potassium: 4.1 mEq/L (ref 3.7–5.3)
Sodium: 139 mEq/L (ref 137–147)
Total Bilirubin: <0.2 mg/dL — ABNORMAL LOW (ref 0.3–1.2)
Total Protein: 7.6 g/dL (ref 6.0–8.3)

## 2013-11-05 LAB — ABO/RH
ABO/RH(D): A NEG
Antibody Screen: NEGATIVE

## 2013-11-05 LAB — HIV ANTIBODY (ROUTINE TESTING W REFLEX): HIV 1&2 Ab, 4th Generation: NONREACTIVE

## 2013-11-05 LAB — URINE MICROSCOPIC-ADD ON

## 2013-11-05 LAB — PREGNANCY, URINE: Preg Test, Ur: POSITIVE — AB

## 2013-11-05 LAB — RPR

## 2013-11-05 LAB — HCG, QUANTITATIVE, PREGNANCY: hCG, Beta Chain, Quant, S: 5783 m[IU]/mL — ABNORMAL HIGH (ref <5)

## 2013-11-05 MED ORDER — MICONAZOLE NITRATE 2 % VA CREA
1.0000 | TOPICAL_CREAM | Freq: Every day | VAGINAL | Status: DC
Start: 2013-11-05 — End: 2013-11-27

## 2013-11-05 MED ORDER — METRONIDAZOLE 500 MG PO TABS: 500.0000 mg | ORAL_TABLET | Freq: Two times a day (BID) | ORAL | Status: DC

## 2013-11-05 NOTE — ED Provider Notes (Signed)
CSN: [id number]     Arrival date & time 11/05/13  1058 History   First MD Initiated Contact with Patient 11/05/13 1120     Chief Complaint  Patient presents with  . Breast Pain  . Vaginal Itching     (Consider location/radiation/quality/duration/timing/severity/associated sxs/prior Treatment) The history is provided by the patient.    Patient states that for the past six days she has had diffuse bilateral breast tenderness and 10/10 abdominal pain on the right and lower parts of her abdomen.  The abdominal pain is crampy, like a period.  She has also had burning with urination, abnormal vaginal discharge and vaginal itching for the past three days.  Has also had mild headaches and nausea.  Denies fevers, vomiting, change in bowel habits.  LMP Aug 4 was normal and on time.  She took a home pregnancy test yesterday that was positive.  She is unsure whether she will continue this pregnancy or not. She is G1P0000 (with this pregnancy)  Past Medical History  Diagnosis Date  . Uterine fibroid    History reviewed. No pertinent past surgical history. History reviewed. No pertinent family history. History  Substance Use Topics  . Smoking status: Never Smoker   . Smokeless tobacco: Never Used  . Alcohol Use: No     Comment: occasional   OB History   Grav Para Term Preterm Abortions TAB SAB Ect Mult Living   0              Review of Systems  All other systems reviewed and are negative.     Allergies  Review of patient's allergies indicates no known allergies.  Home Medications   Prior to Admission medications   Medication Sig Start Date End Date Taking? Authorizing Provider  acetaminophen (TYLENOL) 325 MG tablet Take 650 mg by mouth every 6 (six) hours as needed for mild pain.    Historical Provider, MD  Norgestimate-Ethinyl Estradiol Triphasic (ORTHO TRI-CYCLEN, 28,) 0.18/0.215/0.25 MG-35 MCG tablet Take 1 tablet by mouth daily.    Historical Provider, MD  promethazine  (PHENERGAN) 25 MG tablet Take 1 tablet (25 mg total) by mouth every 6 (six) hours as needed for nausea or vomiting. 10/11/13   Fredia Sorrow, MD   BP 129/84  Pulse 85  Temp(Src) 98.3 F (36.8 C) (Oral)  Resp 18  Ht 4\' 11"  (1.499 m)  Wt 165 lb (74.844 kg)  BMI 33.31 kg/m2  SpO2 99%  LMP 10/03/2013 Physical Exam  Nursing note and vitals reviewed. Constitutional: She appears well-developed and well-nourished. No distress.  HENT:  Head: Normocephalic and atraumatic.  Neck: Neck supple.  Cardiovascular: Normal rate and regular rhythm.   Pulmonary/Chest: Effort normal and breath sounds normal. No respiratory distress. She has no wheezes. She has no rales.  Abdominal: Soft. She exhibits no distension. There is tenderness in the right upper quadrant, right lower quadrant, suprapubic area and left lower quadrant. There is no rebound and no guarding.  Genitourinary: Uterus is tender. Cervix exhibits no motion tenderness. Right adnexum displays tenderness. Right adnexum displays no mass and no fullness. Left adnexum displays tenderness. Left adnexum displays no mass and no fullness. No erythema, tenderness or bleeding around the vagina. No foreign body around the vagina. No signs of injury around the vagina. Vaginal discharge found.  Thick white vaginal discharge  Neurological: She is alert.  Skin: She is not diaphoretic.    ED Course  Procedures (including critical care time) Labs Review Labs Reviewed  WET PREP, GENITAL -  Abnormal; Notable for the following:    Yeast Wet Prep HPF POC FEW (*)    Clue Cells Wet Prep HPF POC FEW (*)    WBC, Wet Prep HPF POC FEW (*)    All other components within normal limits  PREGNANCY, URINE - Abnormal; Notable for the following:    Preg Test, Ur POSITIVE (*)    All other components within normal limits  URINALYSIS, ROUTINE W REFLEX MICROSCOPIC - Abnormal; Notable for the following:    APPearance HAZY (*)    Ketones, ur >80 (*)    Leukocytes, UA  MODERATE (*)    All other components within normal limits  COMPREHENSIVE METABOLIC PANEL - Abnormal; Notable for the following:    Total Bilirubin <0.2 (*)    All other components within normal limits  HCG, QUANTITATIVE, PREGNANCY - Abnormal; Notable for the following:    hCG, Beta Chain, Quant, S 5783 (*)    All other components within normal limits  URINE MICROSCOPIC-ADD ON - Abnormal; Notable for the following:    Squamous Epithelial / LPF MANY (*)    Bacteria, UA FEW (*)    All other components within normal limits  GC/CHLAMYDIA PROBE AMP  URINE CULTURE  CBC WITH DIFFERENTIAL  RPR  HIV ANTIBODY (ROUTINE TESTING)  ABO/RH    Imaging Review US Ob Comp Less 14 Wks  11/05/2013   CLINICAL DATA:  Positive pregnancy test, right lower quadrant pelvic pain  EXAM: OBSTETRIC <14 WK [place] AND TRANSVAGINAL OB US  TECHNIQUE: Both transabdominal and transvaginal ultrasound examinations were performed for complete evaluation of the gestation as well as the maternal uterus, adnexal regions, and pelvic cul-de-sac. Transvaginal technique was performed to assess early pregnancy.  COMPARISON:  None.  FINDINGS: Intrauterine gestational sac: Visualized, normal in shape  Yolk sac: Visualized. The amnion may be faintly visualized adjacent to the yolk sac versus a second yolk sac.  Embryo:  Not visualized  Cardiac Activity: Not visualized  MSD:  9  mm   5 w   5                  [place] EDC: 07/03/14  Maternal uterus/adnexae: Right lateral uterine fibroid measures 2.9 x 2.6 x 2.3 cm. Left ovarian corpus luteum noted. Right ovary is normal. Trace free fluid is present.  IMPRESSION: Intrauterine gestational sac and yolk sac visualized but no fetal pole or cardiac activity yet visualized. Followup ultrasound is recommended in 10-14 days to ensure appropriate pregnancy progression and for the purposes of accurate dating.   Electronically Signed   By: Conchita Paris M.D.   On: 11/05/2013 13:21   US Ob Transvaginal  11/05/2013    CLINICAL DATA:  Positive pregnancy test, right lower quadrant pelvic pain  EXAM: OBSTETRIC <14 WK [place] AND TRANSVAGINAL OB US  TECHNIQUE: Both transabdominal and transvaginal ultrasound examinations were performed for complete evaluation of the gestation as well as the maternal uterus, adnexal regions, and pelvic cul-de-sac. Transvaginal technique was performed to assess early pregnancy.  COMPARISON:  None.  FINDINGS: Intrauterine gestational sac: Visualized, normal in shape  Yolk sac: Visualized. The amnion may be faintly visualized adjacent to the yolk sac versus a second yolk sac.  Embryo:  Not visualized  Cardiac Activity: Not visualized  MSD:  9  mm   5 w   5                  [place] EDC: 07/03/14  Maternal uterus/adnexae: Right lateral uterine fibroid  measures 2.9 x 2.6 x 2.3 cm. Left ovarian corpus luteum noted. Right ovary is normal. Trace free fluid is present.  IMPRESSION: Intrauterine gestational sac and yolk sac visualized but no fetal pole or cardiac activity yet visualized. Followup ultrasound is recommended in 10-14 days to ensure appropriate pregnancy progression and for the purposes of accurate dating.   Electronically Signed   By: Conchita Paris M.D.   On: 11/05/2013 13:21     EKG Interpretation None      I have discussed with patient the safety of medications in pregnancy, the fact that our knowledge of drug safety in pregnancy is limited and that I can only make recommendations based on what is currently known at this time and the safety ratings given to medications.  We discussed that medications may pass through the placenta and have encouraged them to make their own decisions regarding the medications used in light of this information.     MDM   Final diagnoses:  Pregnancy  Vaginal yeast infection  Bacterial vaginosis  First trimester pregnancy  Abdominal pain in pregnancy    Afebrile, nontoxic patient with positive pregnancy test, abdominal pain, abnormal vaginal discharge,  dysuria, and breast tenderness.  Labs unremarkable.  All symptoms present for 6 days.  No focal tenderness on abdominal exam.  Suspect soreness and changes associated with early pregnancy.  Pt appears to be [redacted]w[redacted]d presently with gestational sac and yolk sac present in uterus.  Urinalysis is likely contaminated from vaginal infection.  Sent to culture.  Wet prep shows yeast and clue cells, consistent with exam.  Engaged in joint decision making with the patient, will treat for both yeast infection and BV.  Pt has had no vaginal bleeding, I saw absolutely no blood on exam and pt even had no microscopic blood on urinalysis from "clean" catch - I therefore did not treat her with rhogam but did educate her and inform her that she may need to be treated at some point during the pregnancy or if she begins bleeding.  Advised to follow up with obgyn, go directly to [facility] hospital with any concerns or complications regarding this pregnancy.   D/C home with miconazole and flagyl per Up to Date recommendations.  Discussed safe practices with foods, medications, lifestyle choices during pregnancy.  Discussed result, findings, treatment, and follow up  with patient.  Pt given return precautions.  Pt verbalizes understanding and agrees with plan.        Clayton Bibles, PA-C 11/05/13 1427

## 2013-11-05 NOTE — ED Notes (Signed)
Breast soreness and tenderness (positive pregnancy test yesterday); vaginal itching x 2 days. Burning with urination and bilat flank pain.

## 2013-11-05 NOTE — ED Notes (Signed)
Patient returned from Ultrasound. 

## 2013-11-05 NOTE — Discharge Instructions (Signed)
Read the information below.  Use the prescribed medication as directed.  Please discuss all new medications with your pharmacist and/or obstetrician.  You may return to the Emergency Department at any time for worsening condition or any new symptoms that concern you.    If you develop high fevers, worsening abdominal pain, uncontrolled vomiting, or are unable to tolerate fluids by mouth, return to the ER or go directly to [facility] for a recheck.     Abdominal Pain During Pregnancy Abdominal pain is common in pregnancy. Most of the time, it does not cause harm. There are many causes of abdominal pain. Some causes are more serious than others. Some of the causes of abdominal pain in pregnancy are easily diagnosed. Occasionally, the diagnosis takes time to understand. Other times, the cause is not determined. Abdominal pain can be a sign that something is very wrong with the pregnancy, or the pain may have nothing to do with the pregnancy at all. For this reason, always tell your health care provider if you have any abdominal discomfort. HOME CARE INSTRUCTIONS  Monitor your abdominal pain for any changes. The following actions may help to alleviate any discomfort you are experiencing:  Do not have sexual intercourse or put anything in your vagina until your symptoms go away completely.  Get plenty of rest until your pain improves.  Drink clear fluids if you feel nauseous. Avoid solid food as long as you are uncomfortable or nauseous.  Only take over-the-counter or prescription medicine as directed by your health care provider.  Keep all follow-up appointments with your health care provider. SEEK IMMEDIATE MEDICAL CARE IF:  You are bleeding, leaking fluid, or passing tissue from the vagina.  You have increasing pain or cramping.  You have persistent vomiting.  You have painful or bloody urination.  You have a fever.  You notice a decrease in your baby's movements.  You have extreme  weakness or feel faint.  You have shortness of breath, with or without abdominal pain.  You develop a severe headache with abdominal pain.  You have abnormal vaginal discharge with abdominal pain.  You have persistent diarrhea.  You have abdominal pain that continues even after rest, or gets worse. MAKE SURE YOU:   Understand these instructions.  Will watch your condition.  Will get help right away if you are not doing well or get worse. Document Released: 02/16/2005 Document Revised: 12/07/2012 Document Reviewed: 09/15/2012 [facility] Patient Information 2015 [place], [place]. This information is not intended to replace advice given to you by your health care provider. Make sure you discuss any questions you have with your health care provider.  Bacterial Vaginosis Bacterial vaginosis is an infection of the vagina. It happens when too many of certain germs (bacteria) grow in the vagina. HOME CARE  Take your medicine as told by your doctor.  Finish your medicine even if you start to feel better.  Do not have sex until you finish your medicine and are better.  Tell your sex partner that you have an infection. They should see their doctor for treatment.  Practice safe sex. Use condoms. Have only one sex partner. GET HELP IF:  You are not getting better after 3 days of treatment.  You have more grey fluid (discharge) coming from your vagina than before.  You have more pain than before.  You have a fever. MAKE SURE YOU:   Understand these instructions.  Will watch your condition.  Will get help right away if you  are not doing well or get worse. Document Released: 11/26/2007 Document Revised: 12/07/2012 Document Reviewed: 09/28/2012 [facility] Patient Information 2015 [place], [place]. This information is not intended to replace advice given to you by your health care provider. Make sure you discuss any questions you have with your health care provider.  Candidal  Vulvovaginitis Candidal vulvovaginitis is an infection of the vagina and vulva. The vulva is the skin around the opening of the vagina. This may cause itching and discomfort in and around the vagina.  HOME CARE  Only take medicine as told by your doctor.  Do not have sex (intercourse) until the infection is healed or as told by your doctor.  Practice safe sex.  Tell your sex partner about your infection.  Do not douche or use tampons.  Wear cotton underwear. Do not wear tight pants or panty hose.  Eat yogurt. This may help treat and prevent yeast infections. GET HELP RIGHT AWAY IF:   You have a fever.  Your problems get worse during treatment or do not get better in 3 days.  You have discomfort, irritation, or itching in your vagina or vulva area.  You have pain after sex.  You start to get belly (abdominal) pain. MAKE SURE YOU:  Understand these instructions.  Will watch your condition.  Will get help right away if you are not doing well or get worse. Document Released: 05/15/2008 Document Revised: 02/21/2013 Document Reviewed: 05/15/2008 [facility] Patient Information 2015 [place], [place]. This information is not intended to replace advice given to you by your health care provider. Make sure you discuss any questions you have with your health care provider.  First Trimester of Pregnancy The first trimester of pregnancy is from week 1 until the end of week 12 (months 1 through 3). During this time, your baby will begin to develop inside you. At 6-8 weeks, the eyes and face are formed, and the heartbeat can be seen on ultrasound. At the end of 12 weeks, all the baby's organs are formed. Prenatal care is all the medical care you receive before the birth of your baby. Make sure you get good prenatal care and follow all of your doctor's instructions. HOME CARE  Medicines  Take medicine only as told by your doctor. Some medicines are safe and some are not during  pregnancy.  Take your prenatal vitamins as told by your doctor.  Take medicine that helps you poop (stool softener) as needed if your doctor says it is okay. Diet  Eat regular, healthy meals.  Your doctor will tell you the amount of weight gain that is right for you.  Avoid raw meat and uncooked cheese.  If you feel sick to your stomach (nauseous) or throw up (vomit):  Eat 4 or 5 small meals a day instead of 3 large meals.  Try eating a few soda crackers.  Drink liquids between meals instead of during meals.  If you have a hard time pooping (constipation):  Eat high-fiber foods like fresh vegetables, fruit, and whole grains.  Drink enough fluids to keep your pee (urine) clear or pale yellow. Activity and Exercise  Exercise only as told by your doctor. Stop exercising if you have cramps or pain in your lower belly (abdomen) or low back.  Try to avoid standing for long periods of time. Move your legs often if you must stand in one place for a long time.  Avoid heavy lifting.  Wear low-heeled shoes. Sit and stand up straight.  You can  have sex unless your doctor tells you not to. Relief of Pain or Discomfort  Wear a good support bra if your breasts are sore.  Take warm water baths (sitz baths) to soothe pain or discomfort caused by hemorrhoids. Use hemorrhoid cream if your doctor says it is okay.  Rest with your legs raised if you have leg cramps or low back pain.  Wear support hose if you have puffy, bulging veins (varicose veins) in your legs. Raise (elevate) your feet for 15 minutes, 3-4 times a day. Limit salt in your diet. Prenatal Care  Schedule your prenatal visits by the twelfth week of pregnancy.  Write down your questions. Take them to your prenatal visits.  Keep all your prenatal visits as told by your doctor. Safety  Wear your seat belt at all times when driving.  Make a list of emergency phone numbers. The list should include numbers for family,  friends, the hospital, and police and fire departments. General Tips  Ask your doctor for a referral to a local prenatal class. Begin classes no later than at the start of month 6 of your pregnancy.  Ask for help if you need counseling or help with nutrition. Your doctor can give you advice or tell you where to go for help.  Do not use hot tubs, steam rooms, or saunas.  Do not douche or use tampons or scented sanitary pads.  Do not cross your legs for long periods of time.  Avoid litter boxes and soil used by cats.  Avoid all smoking, herbs, and alcohol. Avoid drugs not approved by your doctor.  Visit your dentist. At home, brush your teeth with a soft toothbrush. Be gentle when you floss. GET HELP IF:  You are dizzy.  You have mild cramps or pressure in your lower belly.  You have a nagging pain in your belly area.  You continue to feel sick to your stomach, throw up, or have watery poop (diarrhea).  You have a bad smelling fluid coming from your vagina.  You have pain with peeing (urination).  You have increased puffiness (swelling) in your face, hands, legs, or ankles. GET HELP RIGHT AWAY IF:   You have a fever.  You are leaking fluid from your vagina.  You have spotting or bleeding from your vagina.  You have very bad belly cramping or pain.  You gain or lose weight rapidly.  You throw up blood. It may look like coffee grounds.  You are around people who have [place] measles, fifth disease, or chickenpox.  You have a very bad headache.  You have shortness of breath.  You have any kind of trauma, such as from a fall or a car accident. Document Released: 08/05/2007 Document Revised: 07/03/2013 Document Reviewed: 12/27/2012 [facility] Patient Information 2015 [place], [place]. This information is not intended to replace advice given to you by your health care provider. Make sure you discuss any questions you have with your health care provider.    Emergency  Department Resource Guide 1) Find a Doctor and Pay Out of Pocket Although you won't have to find out who is covered by your insurance plan, it is a good idea to ask around and get recommendations. You will then need to call the office and see if the doctor you have chosen will accept you as a new patient and what types of options they offer for patients who are self-pay. Some doctors offer discounts or will set up payment plans for their patients who  do not have insurance, but you will need to ask so you aren't surprised when you get to your appointment.  2) Contact Your Local Health Department Not all health departments have doctors that can see patients for sick visits, but many do, so it is worth a call to see if yours does. If you don't know where your local health department is, you can check in your phone book. The CDC also has a tool to help you locate your state's health department, and many state websites also have listings of all of their local health departments.  3) Find a [facility] If your illness is not likely to be very severe or complicated, you may want to try a walk in clinic. These are popping up all over the country in pharmacies, drugstores, and shopping centers. They're usually staffed by nurse practitioners or physician assistants that have been trained to treat common illnesses and complaints. They're usually fairly quick and inexpensive. However, if you have serious medical issues or chronic medical problems, these are probably not your best option.  No Primary Care Doctor: - Call Health Connect at  [phone] - they can help you locate a primary care doctor that  accepts your insurance, provides certain services, etc. - Physician Referral Service- [phone]  Chronic Pain Problems: Organization         Address  Phone   Notes  [facility]  [phone] Patients need to be referred by their primary care doctor.   Medication  Assistance: Organization         Address  Phone   Notes  [facility] Medication [facility] [address] [place], [place] [address] [phone] --Must be a resident of [facility] -- Must have NO insurance coverage whatsoever (no Medicaid/ Medicare, etc.) -- The pt. MUST have a primary care doctor that directs their care regularly and follows them in the community   MedAssist  [phone]   [employer]  [phone]    Agencies that provide inexpensive medical care: Organization         Address  Phone   Notes  [place]  [phone]   [facility]    [phone]   [facility] [place], [place] [address] [phone]   [place] [address] [place] [phone]   Planned Parenthood    [phone]   [facility]    [phone]   [place] and [place] [address], [place] Phone:  [phone], Fax:  [phone] Hours of Operation:  9 am - 6 pm, M-F.  Also accepts Medicaid/Medicare and self-pay.  [facility] [place], [address],  [place] Phone: [phone], Fax: [phone]. Hours of Operation:  8:30 am - 5:30 pm, M-F.  Also accepts Medicaid and self-pay.  [facility] [place] [address] [place] Phone: [phone]   [place], [place], [place] [phone], Ext. [phone] Mondays & Thursdays: 7-9 AM.  First 15 patients are seen on a first come, first serve basis.    [place] Providers:  Organization         Address  Phone   Notes  [facility] [address],  [phone] Also accepts self-pay patients.  [place] [phone]  [place], [place]  [phone]   [place], [address], [place]  [phone]   [place] [address], [place] [phone]   Lucianne Lei [address], [place]   [phone] Only accepts [place] Access [place] patients after they have their name applied to their card.   Self-Pay (no insurance) in [facility]:  Organization         Address  Phone   Notes  Sickle Cell Patients, [facility] Internal Medicine [place] [phone]   [facility]gent Care [place] [phone]   [facility] [place]  [place], [place], [place] [phone]   [facility]/Dr. Osei-Bonsu  [address], [place] or [address], [place] [phone] Phone number for both [place] and [place] locations is the same.  [facility] [place] [address] [place] [phone]   [facility] [address] [place] or [address] [phone]   [facility] [address] [place] [phone], phone; [phone], fax Sees patients 1st and 3rd Saturday of every month.  Must not qualify for public or private insurance (i.e. Medicaid, Medicare, [place] Health Choice, Veterans' Benefits)  Household income should be no more than 200% of the poverty level The clinic cannot treat you if you are pregnant or think you are pregnant  Sexually transmitted diseases are not treated at the clinic.    Dental Care: Organization         Address  Phone  Notes  [facility] Department of [facility] [place] [phone] Accepts children up to age 62 who are enrolled in [place] or [place]; pregnant women with a Medicaid card; and children who have applied for Medicaid or [place] Health Choice, but were declined, whose parents can pay a reduced fee at time of service.  [facility]  Department of [facility]  [address], [place] [phone] Accepts children up to age 4 who are enrolled in [place] or [place]; pregnant women with a Medicaid card; and children who have applied for Medicaid or [place] Health Choice, but were declined, whose parents can pay a reduced fee at time of service.  [place] Adult [facility] Access PROGRAM  [place] [phone] Patients are seen by appointment only. Walk-ins are not accepted. [place] will see patients 12 years of age and older. Monday - Tuesday (8am-5pm) Most Wednesdays (8:30-5pm) $30 per visit, cash only  [facility] Adult Dental Access PRO[facility]  [address], [facility] [phone] Patients are seen by appointment only. Walk-ins are not accepted. [place] will see patients 80 years of age and older. One Wednesday Evening (Monthly: Volunteer Based).  $30 per visit, cash only  [place]  [phone] for adults; Children under age 80, call Graduate Pediatric Dentistry at [phone]. Children aged 57-14, please call [phone] to request a pediatric application.  Dental services are provided in all areas of dental care including fillings, crowns and bridges, complete and partial dentures, implants, gum treatment, root canals, and extractions. Preventive care is also provided. Treatment is provided to both adults and children. Patients are  selected via a lottery and there is often a waiting list.   [facility] [address], [place]  [phone] [email or web address]   [facility] [address] [place], [place] [phone], Ext. [phone] Second and Fourth Thursday of each month, opens at 6:30 AM; Clinic ends at 9 AM.  Patients are seen on a first-come first-served basis, and a limited number are seen during each clinic.   [facility]  [address] Hillard Danker [place], [place] [phone]    Eligibility Requirements You must have lived in [place], [place], or [place] counties for at least the last three months.   You cannot be eligible for state or federal sponsored [employer], including [employer], [place], or [employer].   You generally cannot be eligible for healthcare insurance through your employer.    How to apply: Eligibility screenings are held every Tuesday and Wednesday afternoon from 1:00 pm until 4:00 pm. You do not need an appointment for the interview!  [facility] [address] [place], [place]   [place]  [place] Department  [place]  [phone]    Behavioral Health Resources in the Community: Intensive Outpatient Programs Organization         Address  Phone  Notes  [place]. [address], [place], [place] [phone]   [facility] [address] [place], [place]   ADS: Alcohol & Drug Svcs [address] [place], [place]   [place] [address],  [place], [place] or [phone]   Substance Abuse Resources Organization         Address  Phone  Notes  Alcohol and Drug Services  [phone]   [place]  [phone]   The [place]   Chinita Pester  [phone]   Residential & Outpatient Substance Abuse Program  [phone]   Psychological Services Organization         Address  Phone  Notes  [facility] [place]  [place]  [phone]   [place] [address], [place] or [phone]    Mobile Crisis Teams Organization         Address  Phone  Notes  [facility], Mobile Crisis Care Unit  [phone]   [facility]  [address]  [place], [place]   [place] [address], [place] [phone]    Self-Help/Support Groups Organization         Address  Phone             Notes  [place]. of [place] - variety of support groups  [place] Call for more information  Narcotics Anonymous (NA), [facility] [address], [employer] [place]  2 meetings at this location   Special educational needs teacher         Address  Phone  Notes  [facility] [place],    [place]  [phone]   [facility]  [address] [place] [address], [place], [place]   [place], [place] [phone] Admissions: 8am-3pm M-F  Incentives Substance [place] [address]    [place], [place]   [facility] [address] Jacinto Reap [place], [place]   [facility] [address]  [place], [place]   [facility] - Intensive Outpatient [address] Krist[address], [place], [place]   [facility] ([place].,  [place], [place] [phone] or [phone]   [facility]idential Treatment Services (RTS) [address], [place], [place] Accepts Medicaid  [facility] [place] [address],  [place] [phone] Substance Abuse/Addiction Treatment   [facility] Organization         Address  Phone  Notes  [facility]  [phone]   Domenic Schwab, PhD [address] Arlis Porta [place], [place]   [phone] or [phone]   [place], [place] [phone]   [place] [address], [place], [place] [phone] Insurance/Medicaid/sponsorship through [facility] [place] and Families [address], [address] [place]                                    [place], [place] [phone] [place] [address], [place] [phone]    Dr. Adele Schilder  [phone]   [facility] Dept. 1) [address], [place] 2) [place] 3)  [place] [address], [place] [phone]  [phone]   [place] [phone] or [phone] (After Hours)

## 2013-11-05 NOTE — ED Notes (Addendum)
Patient transported to US 

## 2013-11-06 LAB — URINE CULTURE: Colony Count: 60000

## 2013-11-07 LAB — GC/CHLAMYDIA PROBE AMP
CT Probe RNA: NEGATIVE
GC Probe RNA: NEGATIVE

## 2013-11-10 NOTE — ED Provider Notes (Signed)
Medical screening examination/treatment/procedure(s) were performed by non-physician practitioner and as supervising physician I was immediately available for consultation/collaboration.   EKG Interpretation None       Varney Biles, MD 11/10/13 [phone]

## 2013-11-27 ENCOUNTER — Encounter (HOSPITAL_COMMUNITY): Payer: Self-pay | Admitting: Emergency Medicine

## 2013-11-27 DIAGNOSIS — K5900 Constipation, unspecified: Secondary | ICD-10-CM | POA: Insufficient documentation

## 2013-11-27 DIAGNOSIS — Z79899 Other long term (current) drug therapy: Secondary | ICD-10-CM | POA: Insufficient documentation

## 2013-11-27 DIAGNOSIS — O210 Mild hyperemesis gravidarum: Secondary | ICD-10-CM | POA: Insufficient documentation

## 2013-11-27 DIAGNOSIS — O3410 Maternal care for benign tumor of corpus uteri, unspecified trimester: Secondary | ICD-10-CM | POA: Insufficient documentation

## 2013-11-27 DIAGNOSIS — O9989 Other specified diseases and conditions complicating pregnancy, childbirth and the puerperium: Secondary | ICD-10-CM | POA: Insufficient documentation

## 2013-11-27 DIAGNOSIS — O30009 Twin pregnancy, unspecified number of placenta and unspecified number of amniotic sacs, unspecified trimester: Secondary | ICD-10-CM | POA: Insufficient documentation

## 2013-11-27 DIAGNOSIS — R197 Diarrhea, unspecified: Secondary | ICD-10-CM | POA: Insufficient documentation

## 2013-11-27 LAB — CBC WITH DIFFERENTIAL/PLATELET
Basophils Absolute: 0 10*3/uL (ref 0.0–0.1)
Basophils Relative: 0 % (ref 0–1)
Eosinophils Absolute: 0.2 10*3/uL (ref 0.0–0.7)
Eosinophils Relative: 2 % (ref 0–5)
HCT: 34.4 % — ABNORMAL LOW (ref 36.0–46.0)
Hemoglobin: 11.5 g/dL — ABNORMAL LOW (ref 12.0–15.0)
Lymphocytes Relative: 28 % (ref 12–46)
Lymphs Abs: 2.4 10*3/uL (ref 0.7–4.0)
MCH: 28.8 pg (ref 26.0–34.0)
MCHC: 33.4 g/dL (ref 30.0–36.0)
MCV: 86.2 fL (ref 78.0–100.0)
Monocytes Absolute: 0.6 10*3/uL (ref 0.1–1.0)
Monocytes Relative: 7 % (ref 3–12)
Neutro Abs: 5.4 10*3/uL (ref 1.7–7.7)
Neutrophils Relative %: 63 % (ref 43–77)
Platelets: 250 10*3/uL (ref 150–400)
RBC: 3.99 MIL/uL (ref 3.87–5.11)
RDW: 14.4 % (ref 11.5–15.5)
WBC: 8.6 10*3/uL (ref 4.0–10.5)

## 2013-11-27 LAB — URINALYSIS, ROUTINE W REFLEX MICROSCOPIC
Bilirubin Urine: NEGATIVE
Glucose, UA: NEGATIVE mg/dL
Ketones, ur: 15 mg/dL — AB
Leukocytes, UA: NEGATIVE
Nitrite: NEGATIVE
Protein, ur: NEGATIVE mg/dL
Specific Gravity, Urine: 1.028 (ref 1.005–1.030)
Urobilinogen, UA: 1 mg/dL (ref 0.0–1.0)
pH: 5 (ref 5.0–8.0)

## 2013-11-27 LAB — URINE MICROSCOPIC-ADD ON

## 2013-11-27 LAB — COMPREHENSIVE METABOLIC PANEL
ALT: 20 U/L (ref 0–35)
AST: 19 U/L (ref 0–37)
Albumin: 3.5 g/dL (ref 3.5–5.2)
Alkaline Phosphatase: 57 U/L (ref 39–117)
Anion gap: 14 (ref 5–15)
BUN: 13 mg/dL (ref 6–23)
CO2: 20 mEq/L (ref 19–32)
Calcium: 9.6 mg/dL (ref 8.4–10.5)
Chloride: 102 mEq/L (ref 96–112)
Creatinine, Ser: 0.7 mg/dL (ref 0.50–1.10)
GFR calc Af Amer: >90 mL/min (ref >90)
GFR calc non Af Amer: >90 mL/min (ref >90)
Glucose, Bld: 96 mg/dL (ref 70–99)
Potassium: 3.7 mEq/L (ref 3.7–5.3)
Sodium: 136 mEq/L — ABNORMAL LOW (ref 137–147)
Total Bilirubin: <0.2 mg/dL — ABNORMAL LOW (ref 0.3–1.2)
Total Protein: 7.6 g/dL (ref 6.0–8.3)

## 2013-11-27 LAB — LIPASE, BLOOD: Lipase: 40 U/L (ref 11–59)

## 2013-11-27 LAB — POC URINE PREG, ED: Preg Test, Ur: POSITIVE — AB

## 2013-11-27 NOTE — ED Notes (Signed)
Pt c/o right side abdominal pain, n/v/d for one week. Pt states she has constipation with the diarrhea. Pt states she is [redacted] weeks pregnant. Denies vaginal bleeding. Reports some discharge.

## 2013-11-28 ENCOUNTER — Emergency Department (HOSPITAL_COMMUNITY): Payer: Medicaid Other

## 2013-11-28 ENCOUNTER — Emergency Department (HOSPITAL_COMMUNITY)
Admission: EM | Admit: 2013-11-28 | Discharge: 2013-11-28 | Disposition: A | Discharge status: Home / Self Care | Payer: Self-pay | Attending: Emergency Medicine | Admitting: Emergency Medicine

## 2013-11-28 DIAGNOSIS — D259 Leiomyoma of uterus, unspecified: Secondary | ICD-10-CM

## 2013-11-28 DIAGNOSIS — R109 Unspecified abdominal pain: Secondary | ICD-10-CM

## 2013-11-28 DIAGNOSIS — O30001 Twin pregnancy, unspecified number of placenta and unspecified number of amniotic sacs, first trimester: Secondary | ICD-10-CM

## 2013-11-28 LAB — HCG, QUANTITATIVE, PREGNANCY: hCG, Beta Chain, Quant, S: 75052 m[IU]/mL — ABNORMAL HIGH (ref <5)

## 2013-11-28 LAB — WET PREP, GENITAL
Trich, Wet Prep: NONE SEEN
Yeast Wet Prep HPF POC: NONE SEEN

## 2013-11-28 MED ORDER — ONDANSETRON 4 MG PO TBDP: ORAL_TABLET | ORAL | Status: DC

## 2013-11-28 MED ORDER — ACETAMINOPHEN 325 MG PO TABS
650.0000 mg | ORAL_TABLET | Freq: Once | ORAL | Status: AC
  Administered 2013-11-28: 650 mg via ORAL
  Filled 2013-11-28: qty 2

## 2013-11-28 NOTE — ED Provider Notes (Signed)
CSN: [id number]     Arrival date & time 11/27/13  2052 History   First MD Initiated Contact with Patient 11/28/13 0053     Chief Complaint  Patient presents with  . Abdominal Pain     (Consider location/radiation/quality/duration/timing/severity/associated sxs/prior Treatment) HPI Comments: 28 year old female with echo vaginosis, [redacted] weeks pregnant by dates presents with diarrhea, right mid abdominal pain, vomiting for one week. Symptoms intermittent. Patient feels constipated mixed with diarrhea depending on the day. This is her first pregnancy, no abnormal pregnancy is in the past. No pelvic inflammatory disease or STD history. No pelvic pain. Mild vaginal discharge since being pregnant. No new sexual partners. No history of appendix or gallbladder problems. Patient has not had a formal ultrasound yet.  Patient is a 28 y.o. female presenting with abdominal pain. The history is provided by the patient.  Abdominal Pain Associated symptoms: constipation, diarrhea, nausea and vomiting   Associated symptoms: no chest pain, no chills, no dysuria, no fever and no shortness of breath     Past Medical History  Diagnosis Date  . Uterine fibroid    History reviewed. No pertinent past surgical history. History reviewed. No pertinent family history. History  Substance Use Topics  . Smoking status: Never Smoker   . Smokeless tobacco: Never Used  . Alcohol Use: No     Comment: occasional   OB History   Grav Para Term Preterm Abortions TAB SAB Ect Mult Living   0              Review of Systems  Constitutional: Positive for appetite change. Negative for fever and chills.  HENT: Negative for congestion.   Eyes: Negative for visual disturbance.  Respiratory: Negative for shortness of breath.   Cardiovascular: Negative for chest pain.  Gastrointestinal: Positive for nausea, vomiting, abdominal pain, diarrhea and constipation. Negative for blood in stool.  Genitourinary: Negative for dysuria  and flank pain.  Musculoskeletal: Negative for back pain, neck pain and neck stiffness.  Skin: Negative for rash.  Neurological: Negative for light-headedness and headaches.      Allergies  Review of patient's allergies indicates no known allergies.  Home Medications   Prior to Admission medications   Medication Sig Start Date End Date Taking? Authorizing Provider  acetaminophen (TYLENOL) 325 MG tablet Take 325 mg by mouth 2 (two) times daily as needed for mild pain.    Yes Historical Provider, MD  Prenatal Vit-Fe Fumarate-FA (PRENATAL MULTIVITAMIN) TABS tablet Take 1 tablet by mouth daily at 12 noon.   Yes Historical Provider, MD  promethazine (PHENERGAN) 25 MG tablet Take 1 tablet (25 mg total) by mouth every 6 (six) hours as needed for nausea or vomiting. 10/11/13  Yes Fredia Sorrow, MD   BP 122/73  Pulse 76  Temp(Src) 98.7 F (37.1 C) (Oral)  Resp 16  Ht 4\' 11"  (1.499 m)  SpO2 100% Physical Exam  Nursing note and vitals reviewed. Constitutional: She is oriented to person, place, and time. She appears well-developed and well-nourished.  HENT:  Head: Normocephalic and atraumatic.  Eyes: Conjunctivae are normal. Right eye exhibits no discharge. Left eye exhibits no discharge.  Neck: Normal range of motion. Neck supple. No tracheal deviation present.  Cardiovascular: Normal rate and regular rhythm.   Pulmonary/Chest: Effort normal and breath sounds normal.  Abdominal: Soft. She exhibits no distension. There is tenderness (mild right mid upper flank and lateral abdomen, no right lower quadrant tenderness.). There is no guarding.  Genitourinary:  Very slight discharge no  adnexal or cervical motion tenderness. No bleeding.  Musculoskeletal: She exhibits no edema.  Neurological: She is alert and oriented to person, place, and time.  Skin: Skin is warm. No rash noted.  Psychiatric: She has a normal mood and affect.    ED Course  Procedures (including critical care  time) Emergency Focused Ultrasound Exam Limited retroperitoneal ultrasound of kidneys  Performed and interpreted by Dr. Reather Converse Indication: flank pain Focused abdominal ultrasound with both kidneys imaged in transverse and longitudinal planes in real-time. Interpretation: no hydronephrosis visualized.   Images archived electronically   EMERGENCY DEPARTMENT BILIARY ULTRASOUND INTERPRETATION "Study: Limited Abdominal Ultrasound of the gallbladder and common bile duct."  INDICATIONS: Abdominal pain, Nausea and Vomiting Indication: Multiple views of the gallbladder and common bile duct were obtained in real-time with a Multi-frequency probe." PERFORMED BY:  Myself IMAGES ARCHIVED?: Yes FINDINGS: Gallstones absent, Gallbladder wall normal in thickness, Sonographic Murphy's sign absent and Common bile duct normal in size LIMITATIONS: Body Habitus INTERPRETATION: Normal    EMERGENCY DEPARTMENT [place] PREGNANCY "Study: Limited Ultrasound of the Pelvis for Pregnancy"  INDICATIONS:Pregnancy(required) right abd pain Multiple views of the uterus and pelvic cavity were obtained in real-time with a multi-frequency probe.  APPROACH:Transabdominal   PERFORMED BY: Myself  IMAGES ARCHIVED?: Yes  LIMITATIONS: trans abdo  PREGNANCY FREE FLUID: None  ADNEXAL FINDINGS:no free fluid  PREGNANCY FINDINGS: Intrauterine gestational sac noted, Yolk sac noted, Fetal pole present, Fetal heart activity seen and Multiple pregancy noted  INTERPRETATION: Viable intrauterine pregnancy  GESTATIONAL AGE, ESTIMATE: 8 wks  FETAL HEART RATE: normal both      Labs Review Labs Reviewed  WET PREP, GENITAL - Abnormal; Notable for the following:    Clue Cells Wet Prep HPF POC FEW (*)    WBC, Wet Prep HPF POC FEW (*)    All other components within normal limits  CBC WITH DIFFERENTIAL - Abnormal; Notable for the following:    Hemoglobin 11.5 (*)    HCT 34.4 (*)    All other components within normal limits   COMPREHENSIVE METABOLIC PANEL - Abnormal; Notable for the following:    Sodium 136 (*)    Total Bilirubin <0.2 (*)    All other components within normal limits  URINALYSIS, ROUTINE W REFLEX MICROSCOPIC - Abnormal; Notable for the following:    Hgb urine dipstick SMALL (*)    Ketones, ur 15 (*)    All other components within normal limits  URINE MICROSCOPIC-ADD ON - Abnormal; Notable for the following:    Squamous Epithelial / LPF FEW (*)    All other components within normal limits  HCG, QUANTITATIVE, PREGNANCY - Abnormal; Notable for the following:    hCG, Beta Chain, Quant, S 75052 (*)    All other components within normal limits  POC URINE PREG, ED - Abnormal; Notable for the following:    Preg Test, Ur POSITIVE (*)    All other components within normal limits  GC/CHLAMYDIA PROBE AMP  LIPASE, BLOOD    Imaging Review US Ob Comp Less 14 Wks  11/28/2013   CLINICAL DATA:  Right-sided pain. Twin gestation. Estimated gestational age by LMP is 7 weeks 5 days.  EXAM: TWIN OBSTETRIC <14WK [place] AND TRANSVAGINAL OB US  COMPARISON:  Ultrasound 11/05/2013.  CT 10/11/2013.  FINDINGS: TWIN 1  Intrauterine gestational sac: Visualized/normal in shape.  Yolk sac:  Present.  Embryo:  Fetal pole is identified.  Cardiac Activity: Fetal cardiac activity is identified.  Heart Rate: 171 bpm  CRL:  20.5  mm   8 w 5 d                  [place] EDC: 07/05/2014  TWIN 2  Intrauterine gestational sac: Visualized/normal in shape.  Yolk sac:  Present.  Embryo:  Fetal pole is identified.  Cardiac Activity: Fetal cardiac activity is identified.  Heart Rate: 163 bpm  CRL:  20  mm   8 w 4 d                  [place] EDC: 07/06/2014  Maternal uterus/adnexae: The uterus appears anteverted. There is a large heterogeneous hypoechoic structure arising from the posterior aspect of the inferior uterus measuring 4.5 x 3 x 3.1 cm. This appears represent a uterine fibroid. Mild increase in size since previous study. No subchorionic hemorrhage is  identified. Both ovaries are visualized and appear unremarkable. No abnormal adnexal masses. Minimal free fluid in the pelvis.  IMPRESSION: Twin intrauterine pregnancy. Estimated gestational age by crown-rump length is 8 weeks 5 days for twin 1 and 8 weeks 4 days for twin 2. Large posterior uterine fibroid. No acute complication is appreciated.   Electronically Signed   By: Lucienne Capers M.D.   On: 11/28/2013 04:09   US Ob Comp Addl Gest Less 14 Wks  11/28/2013   CLINICAL DATA:  Right-sided pain. Twin gestation. Estimated gestational age by LMP is 7 weeks 5 days.  EXAM: TWIN OBSTETRIC <14WK [place] AND TRANSVAGINAL OB US  COMPARISON:  Ultrasound 11/05/2013.  CT 10/11/2013.  FINDINGS: TWIN 1  Intrauterine gestational sac: Visualized/normal in shape.  Yolk sac:  Present.  Embryo:  Fetal pole is identified.  Cardiac Activity: Fetal cardiac activity is identified.  Heart Rate: 171 bpm  CRL:  20.5  mm   8 w 5 d                  [place] EDC: 07/05/2014  TWIN 2  Intrauterine gestational sac: Visualized/normal in shape.  Yolk sac:  Present.  Embryo:  Fetal pole is identified.  Cardiac Activity: Fetal cardiac activity is identified.  Heart Rate: 163 bpm  CRL:  20  mm   8 w 4 d                  [place] EDC: 07/06/2014  Maternal uterus/adnexae: The uterus appears anteverted. There is a large heterogeneous hypoechoic structure arising from the posterior aspect of the inferior uterus measuring 4.5 x 3 x 3.1 cm. This appears represent a uterine fibroid. Mild increase in size since previous study. No subchorionic hemorrhage is identified. Both ovaries are visualized and appear unremarkable. No abnormal adnexal masses. Minimal free fluid in the pelvis.  IMPRESSION: Twin intrauterine pregnancy. Estimated gestational age by crown-rump length is 8 weeks 5 days for twin 1 and 8 weeks 4 days for twin 2. Large posterior uterine fibroid. No acute complication is appreciated.   Electronically Signed   By: Lucienne Capers M.D.   On: 11/28/2013  04:09   US Ob Transvaginal  11/28/2013   CLINICAL DATA:  Right-sided pain. Twin gestation. Estimated gestational age by LMP is 7 weeks 5 days.  EXAM: TWIN OBSTETRIC <14WK [place] AND TRANSVAGINAL OB US  COMPARISON:  Ultrasound 11/05/2013.  CT 10/11/2013.  FINDINGS: TWIN 1  Intrauterine gestational sac: Visualized/normal in shape.  Yolk sac:  Present.  Embryo:  Fetal pole is identified.  Cardiac Activity: Fetal cardiac activity is identified.  Heart Rate: 171 bpm  CRL:  20.5  mm  8 w 5 d                  [place] EDC: 07/05/2014  TWIN 2  Intrauterine gestational sac: Visualized/normal in shape.  Yolk sac:  Present.  Embryo:  Fetal pole is identified.  Cardiac Activity: Fetal cardiac activity is identified.  Heart Rate: 163 bpm  CRL:  20  mm   8 w 4 d                  [place] EDC: 07/06/2014  Maternal uterus/adnexae: The uterus appears anteverted. There is a large heterogeneous hypoechoic structure arising from the posterior aspect of the inferior uterus measuring 4.5 x 3 x 3.1 cm. This appears represent a uterine fibroid. Mild increase in size since previous study. No subchorionic hemorrhage is identified. Both ovaries are visualized and appear unremarkable. No abnormal adnexal masses. Minimal free fluid in the pelvis.  IMPRESSION: Twin intrauterine pregnancy. Estimated gestational age by crown-rump length is 8 weeks 5 days for twin 1 and 8 weeks 4 days for twin 2. Large posterior uterine fibroid. No acute complication is appreciated.   Electronically Signed   By: Lucienne Capers M.D.   On: 11/28/2013 04:09     EKG Interpretation None      MDM   Final diagnoses:  Twin pregnancy in first trimester  Right lateral abdominal pain  Uterine leiomyoma, unspecified location   Well-appearing female with early pregnancy and right flank pain. Discussed differential including pregnancy related, kidney stone, gallbladder, atypical appendicitis, urine infection, other. Tylenol given for pain pain improved, no peritonitis on  exam. Bedside ultrasound confirmed new twin pregnancy with normal fetal heart rate. Formal complete ultrasound ordered to look for possible cause of pain in further detail.  Formal pelvic ultrasound results reviewed confirming live intrauterine twin pregnancy is also noting large uterine fibroid. No right lower  Results an quadrant pain in ER. Followup outpatient.d differential diagnosis were discussed with the patient/parent/guardian. Close follow up outpatient was discussed, comfortable with the plan.   Medications  acetaminophen (TYLENOL) tablet 650 mg (650 mg Oral Given 11/28/13 0150)    Filed Vitals:   11/27/13 2110 11/28/13 0115 11/28/13 0230  BP: 135/78 134/71 122/73  Pulse: 95 76 76  Temp: 98.7 F (37.1 C)    TempSrc: Oral    Resp: 16    Height: 4\' 11"  (1.499 m)    SpO2: 98% 100% 100%    Final diagnoses:  None       Mariea Clonts, MD 11/28/13 0425

## 2013-11-28 NOTE — ED Provider Notes (Addendum)
CSN: [id number]     Arrival date & time 10/10/13  1946 History   First MD Initiated Contact with Patient 10/11/13 0023     Chief Complaint  Patient presents with  . Emesis  . Weakness  . Generalized Body Aches     (Consider location/radiation/quality/duration/timing/severity/associated sxs/prior Treatment) Patient is a 28 y.o. female presenting with vomiting and weakness. The history is provided by the patient.  Emesis Severity:  Moderate Timing:  Constant Associated symptoms: abdominal pain, chills, diarrhea and myalgias   Weakness Associated symptoms include abdominal pain. Pertinent negatives include no chest pain and no shortness of breath.  C/O generalized abdominal pain with nausea, vomiting, diarrhea, fatigue, body aches and pain with urination for 4 days. Abd pain is dull ache 6/10 non radiating and not localized. Not made better or worse by anything.   Past Medical History  Diagnosis Date  . Uterine fibroid    History reviewed. No pertinent past surgical history. No family history on file. History  Substance Use Topics  . Smoking status: Never Smoker   . Smokeless tobacco: Never Used  . Alcohol Use: No     Comment: occasional   OB History   Grav Para Term Preterm Abortions TAB SAB Ect Mult Living   0              Review of Systems  Constitutional: Positive for chills and fatigue.  HENT: Negative for congestion.   Eyes: Negative for redness.  Respiratory: Negative for shortness of breath.   Cardiovascular: Negative for chest pain.  Gastrointestinal: Positive for nausea, vomiting, abdominal pain and diarrhea.  Genitourinary: Positive for dysuria.  Musculoskeletal: Positive for myalgias.  Skin: Negative for rash.  Neurological: Positive for weakness.  Hematological: Does not bruise/bleed easily.  Psychiatric/Behavioral: Negative for confusion.      Allergies  Review of patient's allergies indicates no known allergies.  Home Medications   Prior to  Admission medications   Medication Sig Start Date End Date Taking? Authorizing Provider  acetaminophen (TYLENOL) 325 MG tablet Take 325 mg by mouth 2 (two) times daily as needed for mild pain.    Yes Historical Provider, MD  ondansetron (ZOFRAN ODT) 4 MG disintegrating tablet 4mg  ODT q4 hours prn nausea/vomit 11/28/13   Mariea Clonts, MD  Prenatal Vit-Fe Fumarate-FA (PRENATAL MULTIVITAMIN) TABS tablet Take 1 tablet by mouth daily at 12 noon.    Historical Provider, MD  promethazine (PHENERGAN) 25 MG tablet Take 1 tablet (25 mg total) by mouth every 6 (six) hours as needed for nausea or vomiting. 10/11/13   Fredia Sorrow, MD   BP 125/80  Pulse 65  Temp(Src) 97.8 F (36.6 C) (Oral)  Resp 18  Ht 4\' 11"  (1.499 m)  Wt 150 lb (68.04 kg)  BMI 30.28 kg/m2  SpO2 100%  LMP 10/03/2013 Physical Exam  Nursing note and vitals reviewed. Constitutional: She is oriented to person, place, and time. She appears well-developed and well-nourished. No distress.  HENT:  Head: Normocephalic and atraumatic.  Mouth/Throat: Oropharynx is clear and moist.  Eyes: Conjunctivae and EOM are normal. Pupils are equal, round, and reactive to light. No scleral icterus.  Neck: Normal range of motion.  Cardiovascular: Normal rate, regular rhythm and normal heart sounds.   Pulmonary/Chest: Effort normal and breath sounds normal. No respiratory distress.  Abdominal: Soft. Bowel sounds are normal. There is tenderness. There is no guarding.  Musculoskeletal: Normal range of motion.  Neurological: She is alert and oriented to person, place, and time. No  cranial nerve deficit. She exhibits normal muscle tone. Coordination normal.  Skin: Skin is warm. No rash noted.    ED Course  Procedures (including critical care time) Labs Review Labs Reviewed  COMPREHENSIVE METABOLIC PANEL - Abnormal; Notable for the following:    Total Bilirubin 0.2 (*)    All other components within normal limits  CBC WITH DIFFERENTIAL   URINALYSIS, ROUTINE W REFLEX MICROSCOPIC  PREGNANCY, URINE   Results for orders placed during the hospital encounter of 10/10/13  CBC WITH DIFFERENTIAL      Result Value Ref Range   WBC 8.0  4.0 - 10.5 K/uL   RBC 4.13  3.87 - 5.11 MIL/uL   Hemoglobin 12.0  12.0 - 15.0 g/dL   HCT 36.7  36.0 - 46.0 %   MCV 88.9  78.0 - 100.0 fL   MCH 29.1  26.0 - 34.0 pg   MCHC 32.7  30.0 - 36.0 g/dL   RDW 15.0  11.5 - 15.5 %   Platelets 275  150 - 400 K/uL   Neutrophils Relative % 53  43 - 77 %   Neutro Abs 4.3  1.7 - 7.7 K/uL   Lymphocytes Relative 36  12 - 46 %   Lymphs Abs 2.9  0.7 - 4.0 K/uL   Monocytes Relative 9  3 - 12 %   Monocytes Absolute 0.7  0.1 - 1.0 K/uL   Eosinophils Relative 2  0 - 5 %   Eosinophils Absolute 0.1  0.0 - 0.7 K/uL   Basophils Relative 0  0 - 1 %   Basophils Absolute 0.0  0.0 - 0.1 K/uL  COMPREHENSIVE METABOLIC PANEL      Result Value Ref Range   Sodium 140  137 - 147 mEq/L   Potassium 3.8  3.7 - 5.3 mEq/L   Chloride 105  96 - 112 mEq/L   CO2 25  19 - 32 mEq/L   Glucose, Bld 90  70 - 99 mg/dL   BUN 10  6 - 23 mg/dL   Creatinine, Ser 0.75  0.50 - 1.10 mg/dL   Calcium 9.5  8.4 - 10.5 mg/dL   Total Protein 7.3  6.0 - 8.3 g/dL   Albumin 3.8  3.5 - 5.2 g/dL   AST 15  0 - 37 U/L   ALT 9  0 - 35 U/L   Alkaline Phosphatase 72  39 - 117 U/L   Total Bilirubin 0.2 (*) 0.3 - 1.2 mg/dL   GFR calc non Af Amer >90  >90 mL/min   GFR calc Af Amer >90  >90 mL/min   Anion gap 10  5 - 15  URINALYSIS, ROUTINE W REFLEX MICROSCOPIC      Result Value Ref Range   Color, Urine YELLOW  YELLOW   APPearance CLEAR  CLEAR   Specific Gravity, Urine 1.012  1.005 - 1.030   pH 7.0  5.0 - 8.0   Glucose, UA NEGATIVE  NEGATIVE mg/dL   Hgb urine dipstick NEGATIVE  NEGATIVE   Bilirubin Urine NEGATIVE  NEGATIVE   Ketones, ur NEGATIVE  NEGATIVE mg/dL   Protein, ur NEGATIVE  NEGATIVE mg/dL   Urobilinogen, UA 0.2  0.0 - 1.0 mg/dL   Nitrite NEGATIVE  NEGATIVE   Leukocytes, UA NEGATIVE   NEGATIVE  PREGNANCY, URINE      Result Value Ref Range   Preg Test, Ur NEGATIVE  NEGATIVE     Imaging Review  EKG Interpretation None     Results for orders  placed during the hospital encounter of 10/10/13  CBC WITH DIFFERENTIAL      Result Value Ref Range   WBC 8.0  4.0 - 10.5 K/uL   RBC 4.13  3.87 - 5.11 MIL/uL   Hemoglobin 12.0  12.0 - 15.0 g/dL   HCT 36.7  36.0 - 46.0 %   MCV 88.9  78.0 - 100.0 fL   MCH 29.1  26.0 - 34.0 pg   MCHC 32.7  30.0 - 36.0 g/dL   RDW 15.0  11.5 - 15.5 %   Platelets 275  150 - 400 K/uL   Neutrophils Relative % 53  43 - 77 %   Neutro Abs 4.3  1.7 - 7.7 K/uL   Lymphocytes Relative 36  12 - 46 %   Lymphs Abs 2.9  0.7 - 4.0 K/uL   Monocytes Relative 9  3 - 12 %   Monocytes Absolute 0.7  0.1 - 1.0 K/uL   Eosinophils Relative 2  0 - 5 %   Eosinophils Absolute 0.1  0.0 - 0.7 K/uL   Basophils Relative 0  0 - 1 %   Basophils Absolute 0.0  0.0 - 0.1 K/uL  COMPREHENSIVE METABOLIC PANEL      Result Value Ref Range   Sodium 140  137 - 147 mEq/L   Potassium 3.8  3.7 - 5.3 mEq/L   Chloride 105  96 - 112 mEq/L   CO2 25  19 - 32 mEq/L   Glucose, Bld 90  70 - 99 mg/dL   BUN 10  6 - 23 mg/dL   Creatinine, Ser 0.75  0.50 - 1.10 mg/dL   Calcium 9.5  8.4 - 10.5 mg/dL   Total Protein 7.3  6.0 - 8.3 g/dL   Albumin 3.8  3.5 - 5.2 g/dL   AST 15  0 - 37 U/L   ALT 9  0 - 35 U/L   Alkaline Phosphatase 72  39 - 117 U/L   Total Bilirubin 0.2 (*) 0.3 - 1.2 mg/dL   GFR calc non Af Amer >90  >90 mL/min   GFR calc Af Amer >90  >90 mL/min   Anion gap 10  5 - 15  URINALYSIS, ROUTINE W REFLEX MICROSCOPIC      Result Value Ref Range   Color, Urine YELLOW  YELLOW   APPearance CLEAR  CLEAR   Specific Gravity, Urine 1.012  1.005 - 1.030   pH 7.0  5.0 - 8.0   Glucose, UA NEGATIVE  NEGATIVE mg/dL   Hgb urine dipstick NEGATIVE  NEGATIVE   Bilirubin Urine NEGATIVE  NEGATIVE   Ketones, ur NEGATIVE  NEGATIVE mg/dL   Protein, ur NEGATIVE  NEGATIVE mg/dL   Urobilinogen, UA  0.2  0.0 - 1.0 mg/dL   Nitrite NEGATIVE  NEGATIVE   Leukocytes, UA NEGATIVE  NEGATIVE  PREGNANCY, URINE      Result Value Ref Range   Preg Test, Ur NEGATIVE  NEGATIVE     CT abd/pelvis depicted mild wall thickening along gallbladder and some mild intrahepatic biliary ductal dilatation, otherwise no specific findings   MDM   Final diagnoses:  Abdominal pain, unspecified abdominal location    Work up for abdominal pain, vomiting, diarrhea, and dysuria for 4 days without evidence of UTI, negative pregnancy test, and no significant LFT's abnormalities, despite concerns raised by CT scan. Patient improved in ED and will be discharged home with anti-emetics.     Fredia Sorrow, MD 11/28/13 1303  Fredia Sorrow, MD 11/28/13 [phone]

## 2013-11-28 NOTE — Discharge Instructions (Signed)
Take Tylenol for pain. Take Zofran for significant nausea. Followup closely with OB/GYN. If your abdominal pain worsens, you develop fevers, persistent vomiting or if your pain moves to the right lower quadrant return immediately to see your physician or come to the Emergency Department.  Thank you   If you were given medicines take as directed.  If you are on coumadin or contraceptives realize their levels and effectiveness is altered by many different medicines.  If you have any reaction (rash, tongues swelling, other) to the medicines stop taking and see a physician.   Please follow up as directed and return to the ER or see a physician for new or worsening symptoms.  Thank you. Filed Vitals:   11/27/13 2110 11/28/13 0115 11/28/13 0230 11/28/13 0408  BP: 135/78 134/71 122/73 127/65  Pulse: 95 76 76 70  Temp: 98.7 F (37.1 C)   98.6 F (37 C)  TempSrc: Oral   Oral  Resp: 16   16  Height: 4\' 11"  (1.499 m)     SpO2: 98% 100% 100% 96%

## 2013-11-28 NOTE — ED Notes (Signed)
Pt returned from US

## 2013-11-29 LAB — GC/CHLAMYDIA PROBE AMP
CT Probe RNA: NEGATIVE
GC Probe RNA: NEGATIVE

## 2013-12-10 ENCOUNTER — Emergency Department (HOSPITAL_COMMUNITY): Payer: Medicaid Other

## 2013-12-10 ENCOUNTER — Encounter (HOSPITAL_COMMUNITY): Payer: Self-pay | Admitting: Emergency Medicine

## 2013-12-10 ENCOUNTER — Emergency Department (HOSPITAL_COMMUNITY)
Admission: EM | Admit: 2013-12-10 | Discharge: 2013-12-10 | Disposition: A | Discharge status: Home / Self Care | Payer: Medicaid Other | Attending: Emergency Medicine | Admitting: Emergency Medicine

## 2013-12-10 DIAGNOSIS — R109 Unspecified abdominal pain: Secondary | ICD-10-CM

## 2013-12-10 DIAGNOSIS — O219 Vomiting of pregnancy, unspecified: Secondary | ICD-10-CM | POA: Insufficient documentation

## 2013-12-10 DIAGNOSIS — O23591 Infection of other part of genital tract in pregnancy, first trimester: Secondary | ICD-10-CM | POA: Insufficient documentation

## 2013-12-10 DIAGNOSIS — R1031 Right lower quadrant pain: Secondary | ICD-10-CM | POA: Insufficient documentation

## 2013-12-10 DIAGNOSIS — O99611 Diseases of the digestive system complicating pregnancy, first trimester: Secondary | ICD-10-CM | POA: Insufficient documentation

## 2013-12-10 DIAGNOSIS — O26899 Other specified pregnancy related conditions, unspecified trimester: Secondary | ICD-10-CM

## 2013-12-10 DIAGNOSIS — Z3A09 9 weeks gestation of pregnancy: Secondary | ICD-10-CM | POA: Insufficient documentation

## 2013-12-10 DIAGNOSIS — O30001 Twin pregnancy, unspecified number of placenta and unspecified number of amniotic sacs, first trimester: Secondary | ICD-10-CM | POA: Insufficient documentation

## 2013-12-10 DIAGNOSIS — D252 Subserosal leiomyoma of uterus: Secondary | ICD-10-CM | POA: Diagnosis not present

## 2013-12-10 LAB — COMPREHENSIVE METABOLIC PANEL
ALT: 11 U/L (ref 0–35)
AST: 15 U/L (ref 0–37)
Albumin: 3.3 g/dL — ABNORMAL LOW (ref 3.5–5.2)
Alkaline Phosphatase: 52 U/L (ref 39–117)
Anion gap: 12 (ref 5–15)
BUN: 7 mg/dL (ref 6–23)
CO2: 21 mEq/L (ref 19–32)
Calcium: 9.3 mg/dL (ref 8.4–10.5)
Chloride: 102 mEq/L (ref 96–112)
Creatinine, Ser: 0.61 mg/dL (ref 0.50–1.10)
GFR calc Af Amer: >90 mL/min (ref >90)
GFR calc non Af Amer: >90 mL/min (ref >90)
Glucose, Bld: 83 mg/dL (ref 70–99)
Potassium: 3.7 mEq/L (ref 3.7–5.3)
Sodium: 135 mEq/L — ABNORMAL LOW (ref 137–147)
Total Bilirubin: 0.2 mg/dL — ABNORMAL LOW (ref 0.3–1.2)
Total Protein: 7 g/dL (ref 6.0–8.3)

## 2013-12-10 LAB — WET PREP, GENITAL
Clue Cells Wet Prep HPF POC: NONE SEEN
Trich, Wet Prep: NONE SEEN
WBC, Wet Prep HPF POC: NONE SEEN
Yeast Wet Prep HPF POC: NONE SEEN

## 2013-12-10 LAB — CBC WITH DIFFERENTIAL/PLATELET
Basophils Absolute: 0 10*3/uL (ref 0.0–0.1)
Basophils Relative: 0 % (ref 0–1)
Eosinophils Absolute: 0.1 10*3/uL (ref 0.0–0.7)
Eosinophils Relative: 2 % (ref 0–5)
HCT: 35 % — ABNORMAL LOW (ref 36.0–46.0)
Hemoglobin: 11.7 g/dL — ABNORMAL LOW (ref 12.0–15.0)
Lymphocytes Relative: 28 % (ref 12–46)
Lymphs Abs: 2 10*3/uL (ref 0.7–4.0)
MCH: 28.9 pg (ref 26.0–34.0)
MCHC: 33.4 g/dL (ref 30.0–36.0)
MCV: 86.4 fL (ref 78.0–100.0)
Monocytes Absolute: 0.4 10*3/uL (ref 0.1–1.0)
Monocytes Relative: 6 % (ref 3–12)
Neutro Abs: 4.5 10*3/uL (ref 1.7–7.7)
Neutrophils Relative %: 64 % (ref 43–77)
Platelets: 219 10*3/uL (ref 150–400)
RBC: 4.05 MIL/uL (ref 3.87–5.11)
RDW: 14.5 % (ref 11.5–15.5)
WBC: 7 10*3/uL (ref 4.0–10.5)

## 2013-12-10 LAB — URINALYSIS, ROUTINE W REFLEX MICROSCOPIC
Bilirubin Urine: NEGATIVE
Glucose, UA: NEGATIVE mg/dL
Hgb urine dipstick: NEGATIVE
Ketones, ur: NEGATIVE mg/dL
Leukocytes, UA: NEGATIVE
Nitrite: NEGATIVE
Protein, ur: NEGATIVE mg/dL
Specific Gravity, Urine: 1.01 (ref 1.005–1.030)
Urobilinogen, UA: 0.2 mg/dL (ref 0.0–1.0)
pH: 6.5 (ref 5.0–8.0)

## 2013-12-10 LAB — LIPASE, BLOOD: Lipase: 28 U/L (ref 11–59)

## 2013-12-10 MED ORDER — SODIUM CHLORIDE 0.9 % IV BOLUS (SEPSIS)
1000.0000 mL | Freq: Once | INTRAVENOUS | Status: AC
  Administered 2013-12-10: 1000 mL via INTRAVENOUS

## 2013-12-10 MED ORDER — ONDANSETRON HCL 4 MG/2ML IJ SOLN
4.0000 mg | Freq: Once | INTRAMUSCULAR | Status: AC
  Administered 2013-12-10: 4 mg via INTRAVENOUS
  Filled 2013-12-10: qty 2

## 2013-12-10 MED ORDER — METOCLOPRAMIDE HCL 10 MG PO TABS: 10.0000 mg | ORAL_TABLET | Freq: Four times a day (QID) | ORAL | Status: DC

## 2013-12-10 MED ORDER — SODIUM CHLORIDE 0.9 % IV SOLN
INTRAVENOUS | Status: DC
  Administered 2013-12-10: 125 mL/h via INTRAVENOUS

## 2013-12-10 MED ORDER — ACETAMINOPHEN 325 MG PO TABS
650.0000 mg | ORAL_TABLET | Freq: Once | ORAL | Status: AC
  Administered 2013-12-10: 650 mg via ORAL
  Filled 2013-12-10: qty 2

## 2013-12-10 NOTE — ED Notes (Signed)
Patient experiencing sharp 9/10 right lower abdominal pain x 3-4 days with nausea and 3 episodes of emesis in the last 24 hours. Patient is [redacted] weeks pregnant. Pt has had this pain before and was dx with fibroids.

## 2013-12-10 NOTE — ED Notes (Signed)
Shown dr.allen blood gas results

## 2013-12-10 NOTE — ED Provider Notes (Signed)
CSN: [id number]     Arrival date & time 12/10/13  0539 History   First MD Initiated Contact with Patient 12/10/13 [id number]     Chief Complaint  Patient presents with  . Abdominal Pain  . Nausea  . Emesis     (Consider location/radiation/quality/duration/timing/severity/associated sxs/prior Treatment) HPI Comments: Patient who is currently [redacted] weeks pregnant presents today with RLQ abdominal pain.  She reports that the pain has been intermittent over the past 3-4 days.  Pain does not radiate.  She reports that her pain is similar to pain that she had when she was seen in the ED on 11/28/13 for RLQ abdominal pain.  At that time she had an ultrasound, which confirmed an IUP with twins.  Ultrasound also showed a fibroid.  Patient reports that the pain today is similar to the pain that she had then.  She reports that pain is associated with nausea and three episodes of vomiting.  She also reports that she has had a couple episodes of diarrhea over the past couple of days.  She denies blood in her emesis or stool.  She denies fever, chills, urinary symptoms, vaginal discharge, or vaginal bleeding.    Patient is a 28 y.o. female presenting with abdominal pain and vomiting. The history is provided by the patient.  Abdominal Pain Associated symptoms: vomiting   Emesis Associated symptoms: abdominal pain     Past Medical History  Diagnosis Date  . Uterine fibroid    History reviewed. No pertinent past surgical history. No family history on file. History  Substance Use Topics  . Smoking status: Never Smoker   . Smokeless tobacco: Never Used  . Alcohol Use: No     Comment: occasional   OB History   Grav Para Term Preterm Abortions TAB SAB Ect Mult Living   0              Review of Systems  Gastrointestinal: Positive for vomiting and abdominal pain.  All other systems reviewed and are negative.     Allergies  Review of patient's allergies indicates no known allergies.  Home Medications    Prior to Admission medications   Medication Sig Start Date End Date Taking? Authorizing Provider  Prenatal Vit-Fe Fumarate-FA (PRENATAL MULTIVITAMIN) TABS tablet Take 1 tablet by mouth daily at 12 noon.   Yes Historical Provider, MD  promethazine (PHENERGAN) 25 MG tablet Take 1 tablet (25 mg total) by mouth every 6 (six) hours as needed for nausea or vomiting. 10/11/13  Yes Fredia Sorrow, MD   BP 119/74  Pulse 78  Temp(Src) 97.9 F (36.6 C) (Oral)  Resp 15  SpO2 100% Physical Exam  Nursing note and vitals reviewed. Constitutional: She appears well-developed and well-nourished.  HENT:  Head: Normocephalic and atraumatic.  Mouth/Throat: Oropharynx is clear and moist.  Neck: Normal range of motion. Neck supple.  Cardiovascular: Normal rate, regular rhythm and normal heart sounds.   Pulmonary/Chest: Effort normal and breath sounds normal.  Abdominal: Soft. Bowel sounds are normal. She exhibits no distension and no mass. There is tenderness in the right lower quadrant. There is no rigidity, no rebound and no guarding.  Genitourinary: Cervix exhibits no motion tenderness. Right adnexum displays no mass, no tenderness and no fullness. Left adnexum displays no mass, no tenderness and no fullness.  Cervical os is closed.  No bleeding   Musculoskeletal: Normal range of motion.  Neurological: She is alert.  Skin: Skin is warm and dry.  Psychiatric: She has a normal  mood and affect.    ED Course  Procedures (including critical care time) Labs Review Labs Reviewed  CBC WITH DIFFERENTIAL - Abnormal; Notable for the following:    Hemoglobin 11.7 (*)    HCT 35.0 (*)    All other components within normal limits  COMPREHENSIVE METABOLIC PANEL - Abnormal; Notable for the following:    Sodium 135 (*)    Albumin 3.3 (*)    Total Bilirubin 0.2 (*)    All other components within normal limits  LIPASE, BLOOD  URINALYSIS, ROUTINE W REFLEX MICROSCOPIC    Imaging Review Mr Pelvis Wo  Contrast  12/10/2013   CLINICAL DATA:  Right lower quadrant pain for 3-4 days. Nausea. Vomiting. Nine weeks pregnant.  EXAM: MRI ABDOMEN AND PELVIS WITHOUT CONTRAST  TECHNIQUE: Multiplanar multisequence MR imaging of the abdomen and pelvis was performed. No intravenous contrast was administered.  COMPARISON:  CT on 10/11/2013  FINDINGS: MRI ABDOMEN FINDINGS  Visualized portion of the abdominal parenchymal organs are unremarkable in appearance. Gallbladder is unremarkable. There is no evidence hydronephrosis. No abdominal mass or inflammatory process identified. No evidence of dilated abdominal bowel loops.  MRI PELVIS FINDINGS  An early twin intrauterine pregnancy is seen. An intramural fibroid is seen in the right lateral uterine wall measuring 3.6 x 3.0 cm by 4.5 cm.  Both ovaries are normal in appearance. No adnexal masses are identified. Normal appendix is visualized. No other inflammatory process or abnormal fluid collections identified  IMPRESSION: Early twin intrauterine pregnancy.  4.5 fibroid in the right lateral uterine wall.  No evidence of appendicitis, hydronephrosis, or other acute findings.   Electronically Signed   By: Earle Gell M.D.   On: 12/10/2013 12:34   Mr Abdomen Wo Contrast  12/10/2013   CLINICAL DATA:  Right lower quadrant pain for 3-4 days. Nausea. Vomiting. Nine weeks pregnant.  EXAM: MRI ABDOMEN AND PELVIS WITHOUT CONTRAST  TECHNIQUE: Multiplanar multisequence MR imaging of the abdomen and pelvis was performed. No intravenous contrast was administered.  COMPARISON:  CT on 10/11/2013  FINDINGS: MRI ABDOMEN FINDINGS  Visualized portion of the abdominal parenchymal organs are unremarkable in appearance. Gallbladder is unremarkable. There is no evidence hydronephrosis. No abdominal mass or inflammatory process identified. No evidence of dilated abdominal bowel loops.  MRI PELVIS FINDINGS  An early twin intrauterine pregnancy is seen. An intramural fibroid is seen in the right lateral  uterine wall measuring 3.6 x 3.0 cm by 4.5 cm.  Both ovaries are normal in appearance. No adnexal masses are identified. Normal appendix is visualized. No other inflammatory process or abnormal fluid collections identified  IMPRESSION: Early twin intrauterine pregnancy.  4.5 fibroid in the right lateral uterine wall.  No evidence of appendicitis, hydronephrosis, or other acute findings.   Electronically Signed   By: Earle Gell M.D.   On: 12/10/2013 12:34   US Ob Comp Less 14 Wks  12/10/2013   CLINICAL DATA:  28 year old G1 P0, LMP 09/26/2013 (10 weeks 5 days), twin gestation, presenting with right lower quadrant abdominal pain and right-sided pelvic pain associated with nausea and vomiting.  EXAM: TWIN OBSTETRIC <14 WK [place] AND TRANSVAGINAL OB US  COMPARISON:  Twin early OB ultrasound 11/28/2013. MRI abdomen pelvis performed same date.  FINDINGS: TWIN 1 (maternal right)  Intrauterine gestational sac: Single amniotic sac with thin intertwin membrane, normal in appearance.  Yolk sac:  Visualized.  Embryo:  Visualized.  Cardiac Activity: Visualized.  Heart Rate: 160 bpm  CRL:  30.0 mm   10  w 5  d                  [place] EDC: 07/13/2014  TWIN 2 (maternal left)  Intrauterine gestational sac: Single amniotic sac within intertwin membrane, normal in appearance.  Yolk sac:  Visualized.  Embryo:  Visualized.  Cardiac Activity: Visualized.  Heart Rate: 153 bpm  CRL:  37.6 mm   10 w 5 d                  [place] EDC: 07/13/2014  Maternal uterus/adnexae: Approximate 5.2 x 4.4 x 3.8 cm subserosal fibroid involving the right lateral uterine body. Focal myometrial contraction superior to the gestational sac. Normal-appearing ovaries bilaterally, the left measuring approximately 2.8 x 2.2 x 2.4 cm in the right measuring approximately 2.9 x 1.5 x 2.5 cm. No adnexal masses or free pelvic fluid. Cervix closed measuring approximately 5 cm transvaginally.  IMPRESSION: 1. Twin gestation with living embryos each with estimated gestational age of  [redacted] weeks 5 days by crown-rump length, corresponding exactly with the assigned gestational age. 2. Focal contraction in the uterus superior to the gestational sac during the ultrasound examination. 3. Approximate 5 cm subserosal fibroid involving the right lateral uterine body. 4. Normal-appearing ovaries bilaterally. No adnexal masses or free pelvic fluid. 5. Closed uterine cervix measuring approximately 5 cm in length transvaginally.   Electronically Signed   By: Evangeline Dakin M.D.   On: 12/10/2013 11:06   US Ob Comp Addl Gest Less 14 Wks  12/10/2013   CLINICAL DATA:  28 year old G1 P0, LMP 09/26/2013 (10 weeks 5 days), twin gestation, presenting with right lower quadrant abdominal pain and right-sided pelvic pain associated with nausea and vomiting.  EXAM: TWIN OBSTETRIC <14 WK [place] AND TRANSVAGINAL OB US  COMPARISON:  Twin early OB ultrasound 11/28/2013. MRI abdomen pelvis performed same date.  FINDINGS: TWIN 1 (maternal right)  Intrauterine gestational sac: Single amniotic sac with thin intertwin membrane, normal in appearance.  Yolk sac:  Visualized.  Embryo:  Visualized.  Cardiac Activity: Visualized.  Heart Rate: 160 bpm  CRL:  30.0 mm   10 w 5  d                  [place] EDC: 07/13/2014  TWIN 2 (maternal left)  Intrauterine gestational sac: Single amniotic sac within intertwin membrane, normal in appearance.  Yolk sac:  Visualized.  Embryo:  Visualized.  Cardiac Activity: Visualized.  Heart Rate: 153 bpm  CRL:  37.6 mm   10 w 5 d                  [place] EDC: 07/13/2014  Maternal uterus/adnexae: Approximate 5.2 x 4.4 x 3.8 cm subserosal fibroid involving the right lateral uterine body. Focal myometrial contraction superior to the gestational sac. Normal-appearing ovaries bilaterally, the left measuring approximately 2.8 x 2.2 x 2.4 cm in the right measuring approximately 2.9 x 1.5 x 2.5 cm. No adnexal masses or free pelvic fluid. Cervix closed measuring approximately 5 cm transvaginally.  IMPRESSION: 1. Twin  gestation with living embryos each with estimated gestational age of [redacted] weeks 5 days by crown-rump length, corresponding exactly with the assigned gestational age. 2. Focal contraction in the uterus superior to the gestational sac during the ultrasound examination. 3. Approximate 5 cm subserosal fibroid involving the right lateral uterine body. 4. Normal-appearing ovaries bilaterally. No adnexal masses or free pelvic fluid. 5. Closed uterine cervix measuring approximately 5 cm in length transvaginally.   Electronically  Signed   By: Evangeline Dakin M.D.   On: 12/10/2013 11:06   US Ob Transvaginal  12/10/2013   CLINICAL DATA:  28 year old G1 P0, LMP 09/26/2013 (10 weeks 5 days), twin gestation, presenting with right lower quadrant abdominal pain and right-sided pelvic pain associated with nausea and vomiting.  EXAM: TWIN OBSTETRIC <14 WK [place] AND TRANSVAGINAL OB US  COMPARISON:  Twin early OB ultrasound 11/28/2013. MRI abdomen pelvis performed same date.  FINDINGS: TWIN 1 (maternal right)  Intrauterine gestational sac: Single amniotic sac with thin intertwin membrane, normal in appearance.  Yolk sac:  Visualized.  Embryo:  Visualized.  Cardiac Activity: Visualized.  Heart Rate: 160 bpm  CRL:  30.0 mm   10 w 5  d                  [place] EDC: 07/13/2014  TWIN 2 (maternal left)  Intrauterine gestational sac: Single amniotic sac within intertwin membrane, normal in appearance.  Yolk sac:  Visualized.  Embryo:  Visualized.  Cardiac Activity: Visualized.  Heart Rate: 153 bpm  CRL:  37.6 mm   10 w 5 d                  [place] EDC: 07/13/2014  Maternal uterus/adnexae: Approximate 5.2 x 4.4 x 3.8 cm subserosal fibroid involving the right lateral uterine body. Focal myometrial contraction superior to the gestational sac. Normal-appearing ovaries bilaterally, the left measuring approximately 2.8 x 2.2 x 2.4 cm in the right measuring approximately 2.9 x 1.5 x 2.5 cm. No adnexal masses or free pelvic fluid. Cervix closed measuring  approximately 5 cm transvaginally.  IMPRESSION: 1. Twin gestation with living embryos each with estimated gestational age of [redacted] weeks 5 days by crown-rump length, corresponding exactly with the assigned gestational age. 2. Focal contraction in the uterus superior to the gestational sac during the ultrasound examination. 3. Approximate 5 cm subserosal fibroid involving the right lateral uterine body. 4. Normal-appearing ovaries bilaterally. No adnexal masses or free pelvic fluid. 5. Closed uterine cervix measuring approximately 5 cm in length transvaginally.   Electronically Signed   By: Evangeline Dakin M.D.   On: 12/10/2013 11:06     EKG Interpretation None     1:30 PM Discussed results of the ultrasound with OB/GYN.  He reports that the focal contraction of the uterus reported is not anything to be concerned about. MDM   Final diagnoses:  None   Patient who is currently 10 weeks 5 days pregnant presents to the ED with a chief complaint of RLQ abdominal pain.   No prior abdominal surgeries.  On exam pain is localized to the RLQ.  Therefore, concern for possible Appendicitis.  MRI therefore ordered to rule out an Appendicitis.  MRI negative for acute findings, but does show a fibroid of the right lateral uterine body.  Ultrasound shows a normal IUP and a closed cervix.  Heart rate twin 1 is 160 bpm.  Heart rate twin 2 is 153 bpm.  No vaginal bleeding on exam.  No CMT or adnexal tenderness with pelvic exam.  No vomiting in the ED.  Patient tolerating PO liquids.  Feel that the patient is stable for discharge.  Patient given [facility].  Patient instructed to continue taking her Prenatal vitamins and given Reglan for nausea.  Patient instructed to take Tylenol for pain.  Return precautions given.    Hyman Bible, PA-C 12/11/13 1027

## 2013-12-10 NOTE — Discharge Instructions (Signed)
Continue taking your Prenatal Vitamins.  Take Reglan as needed for nausea.  Take Tylenol as needed for pain.

## 2013-12-10 NOTE — ED Notes (Signed)
Per PA order given patient [place] sandwich and apple sauce. Patient stated she was hungry. Tolerating water without incident.

## 2013-12-11 LAB — GC/CHLAMYDIA PROBE AMP
CT Probe RNA: NEGATIVE
GC Probe RNA: NEGATIVE

## 2013-12-11 NOTE — ED Provider Notes (Signed)
Medical screening examination/treatment/procedure(s) were conducted as a shared visit with non-physician practitioner(s) and myself.  I personally evaluated the patient during the encounter.  Confirmed twin IUP at 9 weeks with RLQ pain x 3 days.  Intermittent. Some nausea and vomiting.  Some diarrhea. TTP RLQ, no guarding. No fever, no leukocytosis. Repeat visit for same pain. R/o appy with MRI.   EKG Interpretation None       Ezequiel Essex, MD 12/11/13 [phone]

## 2013-12-12 ENCOUNTER — Ambulatory Visit: Payer: Medicaid Other | Admitting: Gastroenterology

## 2013-12-14 ENCOUNTER — Encounter (HOSPITAL_COMMUNITY): Payer: Self-pay | Admitting: Emergency Medicine

## 2013-12-14 ENCOUNTER — Emergency Department (HOSPITAL_COMMUNITY)
Admission: EM | Admit: 2013-12-14 | Discharge: 2013-12-14 | Disposition: A | Discharge status: Home / Self Care | Payer: Medicaid Other | Attending: Emergency Medicine | Admitting: Emergency Medicine

## 2013-12-14 DIAGNOSIS — O30001 Twin pregnancy, unspecified number of placenta and unspecified number of amniotic sacs, first trimester: Secondary | ICD-10-CM | POA: Insufficient documentation

## 2013-12-14 DIAGNOSIS — O99611 Diseases of the digestive system complicating pregnancy, first trimester: Secondary | ICD-10-CM | POA: Diagnosis present

## 2013-12-14 DIAGNOSIS — R1030 Lower abdominal pain, unspecified: Secondary | ICD-10-CM | POA: Diagnosis not present

## 2013-12-14 DIAGNOSIS — Z3490 Encounter for supervision of normal pregnancy, unspecified, unspecified trimester: Secondary | ICD-10-CM

## 2013-12-14 DIAGNOSIS — Z8742 Personal history of other diseases of the female genital tract: Secondary | ICD-10-CM | POA: Diagnosis not present

## 2013-12-14 DIAGNOSIS — O30021 Conjoined twin pregnancy, first trimester: Secondary | ICD-10-CM | POA: Diagnosis not present

## 2013-12-14 DIAGNOSIS — Z3A10 10 weeks gestation of pregnancy: Secondary | ICD-10-CM | POA: Diagnosis not present

## 2013-12-14 DIAGNOSIS — Z79899 Other long term (current) drug therapy: Secondary | ICD-10-CM | POA: Diagnosis not present

## 2013-12-14 LAB — URINALYSIS, ROUTINE W REFLEX MICROSCOPIC
Bilirubin Urine: NEGATIVE
Glucose, UA: NEGATIVE mg/dL
Hgb urine dipstick: NEGATIVE
Ketones, ur: NEGATIVE mg/dL
Leukocytes, UA: NEGATIVE
Nitrite: NEGATIVE
Protein, ur: NEGATIVE mg/dL
Specific Gravity, Urine: 1.021 (ref 1.005–1.030)
Urobilinogen, UA: 1 mg/dL (ref 0.0–1.0)
pH: 6 (ref 5.0–8.0)

## 2013-12-14 NOTE — Care Management (Signed)
ED CM consulted by Dr. Christy Gentles for OB/GYN f/u. Patient is [redacted] weeks gestation c/o of abdominal pain. Spoke with patient regarding f/u at the [facility]. Referral was sent via in basket. Updated  Dr. Christy Gentles with disposition plan. Patient told to expect call with an appt from [facility], also provided the number to follow up.

## 2013-12-14 NOTE — ED Provider Notes (Signed)
Patient seen/examined in the Emergency Department in conjunction with Resident Physician Provider [facility] Patient reports continuous RLQ abd pain for several days Exam : awake/alert, no focal abd tenderness Plan: recent workup noted, no further imaging required.  Will arrange for OBGYN followup   Sharyon Cable, MD 12/14/13 2019

## 2013-12-14 NOTE — ED Provider Notes (Signed)
CSN: [id number]     Arrival date & time 12/14/13  1727 History   First MD Initiated Contact with Patient 12/14/13 1839     Chief Complaint  Patient presents with  . Abdominal Pain     (Consider location/radiation/quality/duration/timing/severity/associated sxs/prior Treatment) Patient is a 28 y.o. female presenting with abdominal pain. The history is provided by the patient.  Abdominal Pain Pain location:  Suprapubic (pelvic) Pain quality: aching   Pain radiates to:  Does not radiate Pain severity:  Mild Onset quality:  Gradual Duration:  2 weeks Timing:  Constant Progression:  Worsening Chronicity:  Recurrent (patient was seen  4 days ago for same symptoms and had negative ultrasound as well as negative abdominal MRI) Context comment:  Patient [redacted] weeks pregnant with twins. Relieved by: acetaminophen. Worsened by:  Nothing tried Ineffective treatments:  None tried Associated symptoms: no anorexia, no chest pain, no cough, no diarrhea, no dysuria, no fatigue, no fever, no hematemesis, no hematochezia, no hematuria, no nausea, no shortness of breath, no vaginal bleeding, no vaginal discharge and no vomiting   Risk factors: pregnancy   Risk factors: has not had multiple surgeries and not obese     Past Medical History  Diagnosis Date  . Uterine fibroid    History reviewed. No pertinent past surgical history. No family history on file. History  Substance Use Topics  . Smoking status: Never Smoker   . Smokeless tobacco: Never Used  . Alcohol Use: No     Comment: occasional   OB History   Grav Para Term Preterm Abortions TAB SAB Ect Mult Living   0              Review of Systems  Constitutional: Negative for fever, activity change, appetite change and fatigue.  HENT: Negative for congestion and rhinorrhea.   Eyes: Negative for discharge, redness and itching.  Respiratory: Negative for cough, shortness of breath and wheezing.   Cardiovascular: Negative for chest pain.   Gastrointestinal: Positive for abdominal pain. Negative for nausea, vomiting, diarrhea, hematochezia, anorexia and hematemesis.  Genitourinary: Negative for dysuria, hematuria, vaginal bleeding and vaginal discharge.  Musculoskeletal: Negative for back pain.  Skin: Negative for rash and wound.  Neurological: Negative for syncope.      Allergies  Review of patient's allergies indicates no known allergies.  Home Medications   Prior to Admission medications   Medication Sig Start Date End Date Taking? Authorizing Provider  acetaminophen (TYLENOL) 325 MG tablet Take 325-650 mg by mouth every 6 (six) hours as needed for mild pain or headache.   Yes Historical Provider, MD  metoCLOPramide (REGLAN) 10 MG tablet Take 1 tablet (10 mg total) by mouth every 6 (six) hours. 12/10/13  Yes Hyman Bible, PA-C  Prenatal Vit-Fe Fumarate-FA (PRENATAL MULTIVITAMIN) TABS tablet Take 1 tablet by mouth daily at 12 noon.   Yes Historical Provider, MD   BP 111/77  Pulse 78  Temp(Src) 98.3 F (36.8 C) (Oral)  Resp 20  SpO2 99% Physical Exam  Constitutional: She is oriented to person, place, and time. She appears well-developed and well-nourished. No distress.  HENT:  Head: Normocephalic and atraumatic.  Mouth/Throat: Oropharynx is clear and moist. No oropharyngeal exudate.  Eyes: Conjunctivae and EOM are normal. Pupils are equal, round, and reactive to light. Right eye exhibits no discharge. Left eye exhibits no discharge. No scleral icterus.  Neck: Normal range of motion. Neck supple.  Cardiovascular: Normal rate, regular rhythm and normal heart sounds.   No murmur heard.  Pulmonary/Chest: Effort normal and breath sounds normal. No respiratory distress. She has no wheezes. She has no rales.  Abdominal: Soft. She exhibits no distension and no mass. There is no tenderness.  Soft nontender. No peritonitis.  Neurological: She is alert and oriented to person, place, and time. She exhibits normal muscle  tone. Coordination normal.  Skin: Skin is warm. No rash noted. She is not diaphoretic.    ED Course  Procedures (including critical care time) Labs Review Labs Reviewed  URINE CULTURE  URINALYSIS, ROUTINE W REFLEX MICROSCOPIC    Imaging Review No results found.   EKG Interpretation None      MDM   MDM: 28 year old female with twin gestation IUP with recent visit to hospital on 10/11 with ultrasound and abdominal MRI that visit comes in with chief complaint of abdominal pain.  She states that her pain has persisted for weeks and is the exact same pain that she's had.  No change in symptoms. Recent imaging 40s ago included ultrasound and MRI. She had negative pelvic exam at that time as well. Offered her repeat pelvic exam however she declined, which I feel is reasonable given no change in her symptoms. Denies fevers. Abdominal exam reassuring. Urine check negative. I suspect she has normal pain of pregnancy. Explained to her that most medications can have detrimental effects towards her fetus and  And that Tylenol is the best medication for her pain. She appears comfortable with normal vitals. She's only been taking one Tylenol per day and I explained that she could increase her dosing. She does not have a obstetrician, the social worker was contacted for assistance. She will followup with obstetrics. Understands to return to emergency department if worsening pain, change in pain, fevers, or other concerning symptoms. Discharged.  Final diagnoses:  Pregnancy  Lower abdominal pain    Discharge Medication List as of 12/14/2013  8:49 PM     Obstetrician per Case Manager  Schedule an appointment as soon as possible for a visit   Discharged  Sol Passer, MD 12/15/13 0009

## 2013-12-14 NOTE — ED Notes (Signed)
Pt. Reports having bilateral abdominal pain pt. Reports being [redacted] weeks pregnant. She denies having vaginal bleeding. . She reports having pain for 10 weeks. She denies having contractions

## 2013-12-14 NOTE — Discharge Instructions (Signed)

## 2013-12-15 LAB — URINE CULTURE: Colony Count: 30000

## 2013-12-15 NOTE — ED Provider Notes (Signed)
I have personally seen and examined the patient.  I have discussed the plan of care with the resident.  I have reviewed the documentation on PMH/FH/Soc. History.  I have reviewed the documentation of the resident and agree.   Sharyon Cable, MD 12/15/13 1224

## 2013-12-17 ENCOUNTER — Inpatient Hospital Stay (HOSPITAL_COMMUNITY)
Admission: AD | Admit: 2013-12-17 | Discharge: 2013-12-17 | Disposition: A | Discharge status: Home / Self Care | Payer: Medicaid Other | Source: Ambulatory Visit | Attending: Obstetrics & Gynecology | Admitting: Obstetrics & Gynecology

## 2013-12-17 ENCOUNTER — Encounter (HOSPITAL_COMMUNITY): Payer: Self-pay

## 2013-12-17 DIAGNOSIS — O360910 Maternal care for other rhesus isoimmunization, first trimester, not applicable or unspecified: Secondary | ICD-10-CM | POA: Insufficient documentation

## 2013-12-17 DIAGNOSIS — O30009 Twin pregnancy, unspecified number of placenta and unspecified number of amniotic sacs, unspecified trimester: Secondary | ICD-10-CM

## 2013-12-17 DIAGNOSIS — O360111 Maternal care for anti-D [Rh] antibodies, first trimester, fetus 1: Secondary | ICD-10-CM

## 2013-12-17 DIAGNOSIS — D259 Leiomyoma of uterus, unspecified: Secondary | ICD-10-CM | POA: Diagnosis not present

## 2013-12-17 DIAGNOSIS — O4691 Antepartum hemorrhage, unspecified, first trimester: Secondary | ICD-10-CM

## 2013-12-17 DIAGNOSIS — O3411 Maternal care for benign tumor of corpus uteri, first trimester: Secondary | ICD-10-CM | POA: Diagnosis not present

## 2013-12-17 DIAGNOSIS — Z3A11 11 weeks gestation of pregnancy: Secondary | ICD-10-CM | POA: Diagnosis not present

## 2013-12-17 DIAGNOSIS — O30001 Twin pregnancy, unspecified number of placenta and unspecified number of amniotic sacs, first trimester: Secondary | ICD-10-CM | POA: Diagnosis not present

## 2013-12-17 DIAGNOSIS — O209 Hemorrhage in early pregnancy, unspecified: Secondary | ICD-10-CM | POA: Diagnosis present

## 2013-12-17 MED ORDER — RHO D IMMUNE GLOBULIN 1500 UNIT/2ML IJ SOSY
300.0000 ug | PREFILLED_SYRINGE | Freq: Once | INTRAMUSCULAR | Status: AC
  Administered 2013-12-17: 300 ug via INTRAMUSCULAR
  Filled 2013-12-17: qty 2

## 2013-12-17 NOTE — MAU Provider Note (Signed)
History     CSN: [id number]  Arrival date and time: 12/17/13 5631   First Provider Initiated Contact with Patient 12/17/13 0820      Chief Complaint  Patient presents with  . Vaginal Bleeding   HPI 28 y.o. G1P0 at [redacted]w[redacted]d w/ twin IUP c/o vaginal bleeding, first noted around 6:45 this morning, intercourse last night. Has ongoing RLQ pain this pregnancy, 5 cm fibroid noted on u/s 12/10/13 in R uterine body. Blood type A neg.   Past Medical History  Diagnosis Date  . Uterine fibroid     Past Surgical History  Procedure Laterality Date  . No past surgeries      History reviewed. No pertinent family history.  History  Substance Use Topics  . Smoking status: Never Smoker   . Smokeless tobacco: Never Used  . Alcohol Use: No     Comment: occasional    Allergies: No Known Allergies  Prescriptions prior to admission  Medication Sig Dispense Refill  . acetaminophen (TYLENOL) 325 MG tablet Take 325-650 mg by mouth every 6 (six) hours as needed for mild pain or headache.      . metoCLOPramide (REGLAN) 10 MG tablet Take 1 tablet (10 mg total) by mouth every 6 (six) hours.  30 tablet  0  . Prenatal Vit-Fe Fumarate-FA (PRENATAL MULTIVITAMIN) TABS tablet Take 1 tablet by mouth daily at 12 noon.        Review of Systems  Constitutional: Negative.   Respiratory: Negative.   Cardiovascular: Negative.   Gastrointestinal: Negative for nausea, vomiting, abdominal pain, diarrhea and constipation.  Genitourinary: Negative for dysuria, urgency, frequency, hematuria and flank pain.       + bleeding  Musculoskeletal: Negative.   Neurological: Negative.   Psychiatric/Behavioral: Negative.    Physical Exam   Blood pressure 143/73, pulse 82, temperature 98.1 F (36.7 C), temperature source Oral, resp. rate 16, height 4\' 11"  (1.499 m), weight 159 lb 6 oz (72.292 kg), last menstrual period 10/03/2013.  Physical Exam  Nursing note and vitals reviewed. Constitutional: She is oriented to  person, place, and time. She appears well-developed and well-nourished. No distress.  Cardiovascular: Normal rate.   Respiratory: Effort normal.  GI: Soft. There is no tenderness.  Genitourinary: Uterus is enlarged (c/w EGA/twins). No bleeding around the vagina.  No evidence of bleeding, a few friable areas noted on cervix, cervix long/thick/closed  Musculoskeletal: Normal range of motion.  Neurological: She is alert and oriented to person, place, and time.  Skin: Skin is warm and dry.  Psychiatric: She has a normal mood and affect.   + FHR x 2 MAU Course  Procedures Results for orders placed during the hospital encounter of 12/17/13 (from the past 24 hour(s))  RH IG WORKUP (INCLUDES ABO/RH)     Status: None   Collection Time    12/17/13  8:43 AM      Result Value Ref Range   Gestational Age(Wks) 10     ABO/RH(D) A NEG     Antibody Screen NEG     Unit Number [id number]     Blood Component Type RHIG     Unit division 00     Status of Unit ISSUED     Transfusion Status OK TO TRANSFUSE     RHIG given in MAU today  Assessment and Plan   1. Vaginal bleeding in pregnancy, first trimester   2. Twin pregnancy, antepartum   3. Rh negative state in antepartum period, first trimester, fetus 1  No active bleeding. Recommend pelvic rest, keep OB appt tomorrow. Precautions rev'd.     Medication List         acetaminophen 325 MG tablet  Commonly known as:  TYLENOL  Take 325-650 mg by mouth every 6 (six) hours as needed for mild pain or headache.     metoCLOPramide 10 MG tablet  Commonly known as:  REGLAN  Take 1 tablet (10 mg total) by mouth every 6 (six) hours.     prenatal multivitamin Tabs tablet  Take 1 tablet by mouth daily at 12 noon.        Follow-up Information   Follow up with [facility] [place]. (as scheduled tomorrow)    Contact information:   [place] [id number] [phone]        [place] 12/17/2013, 11:02 AM

## 2013-12-17 NOTE — MAU Provider Note (Signed)
Attestation of Attending Supervision of Advanced Practitioner (CNM/NP): Evaluation and management procedures were performed by the Advanced Practitioner under my supervision and collaboration.  I have reviewed the Advanced Practitioner's note and chart, and I agree with the management and plan.  HARRAWAY-SMITH, Sanari Offner 11:03 AM

## 2013-12-17 NOTE — Discharge Instructions (Signed)
Vaginal Bleeding During Pregnancy, First Trimester °A small amount of bleeding (spotting) from the vagina is common in early pregnancy. Sometimes the bleeding is normal and is not a problem, and sometimes it is a sign of something serious. Be sure to tell your doctor about any bleeding from your vagina right away. °HOME CARE °· Watch your condition for any changes. °· Follow your doctor's instructions about how active you can be. °· If you are on bed rest: °¨ You may need to stay in bed and only get up to use the bathroom. °¨ You may be allowed to do some activities. °¨ If you need help, make plans for someone to help you. °· Write down: °¨ The number of pads you use each day. °¨ How often you change pads. °¨ How soaked (saturated) your pads are. °· Do not use tampons. °· Do not douche. °· Do not have sex or orgasms until your doctor says it is okay. °· If you pass any tissue from your vagina, save the tissue so you can show it to your doctor. °· Only take medicines as told by your doctor. °· Do not take aspirin because it can make you bleed. °· Keep all follow-up visits as told by your doctor. °GET HELP IF:  °· You bleed from your vagina. °· You have cramps. °· You have labor pains. °· You have a fever that does not go away after you take medicine. °GET HELP RIGHT AWAY IF:  °· You have very bad cramps in your back or belly (abdomen). °· You pass large clots or tissue from your vagina. °· You bleed more. °· You feel light-headed or weak. °· You pass out (faint). °· You have chills. °· You are leaking fluid or have a gush of fluid from your vagina. °· You pass out while pooping (having a bowel movement). °MAKE SURE YOU: °· Understand these instructions. °· Will watch your condition. °· Will get help right away if you are not doing well or get worse. °Document Released: 07/03/2013 Document Reviewed: 10/24/2012 °ExitCare® Patient Information ©2015 [employer]. This information is not intended to replace advice given  to you by your health care provider. Make sure you discuss any questions you have with your health care provider. ° °

## 2013-12-17 NOTE — MAU Note (Signed)
Pt states at 0645 began bleeding. No clots noted. Had u/s one week ago. Was seen at [facility] for RLQ pain. Pain is still there, intermittent, rates 9/10.

## 2013-12-17 NOTE — MAU Note (Signed)
No adverse reaction 

## 2013-12-18 ENCOUNTER — Other Ambulatory Visit (HOSPITAL_COMMUNITY): Payer: Self-pay | Admitting: Physician Assistant

## 2013-12-18 DIAGNOSIS — Z3682 Encounter for antenatal screening for nuchal translucency: Secondary | ICD-10-CM

## 2013-12-18 DIAGNOSIS — IMO0001 Reserved for inherently not codable concepts without codable children: Secondary | ICD-10-CM

## 2013-12-18 LAB — CULTURE, OB URINE: Urine Culture, OB: NEGATIVE

## 2013-12-18 LAB — RH IG WORKUP (INCLUDES ABO/RH)
ABO/RH(D): A NEG
Antibody Screen: NEGATIVE
Gestational Age(Wks): 10
Unit division: 0

## 2013-12-18 LAB — OB RESULTS CONSOLE GC/CHLAMYDIA
Chlamydia: NEGATIVE
Gonorrhea: NEGATIVE

## 2013-12-18 LAB — OB RESULTS CONSOLE ABO/RH: RH Type: NEGATIVE

## 2013-12-18 LAB — OB RESULTS CONSOLE ANTIBODY SCREEN: Antibody Screen: POSITIVE

## 2013-12-18 LAB — SICKLE CELL SCREEN: Sickle Cell Screen: NORMAL

## 2013-12-18 LAB — OB RESULTS CONSOLE VARICELLA ZOSTER ANTIBODY, IGG: Varicella: IMMUNE

## 2013-12-18 LAB — GLUCOSE TOLERANCE, 1 HOUR (50G) W/O FASTING: Glucose, GTT - 1 Hour: 91 mg/dL (ref ?–200)

## 2013-12-19 ENCOUNTER — Encounter (HOSPITAL_COMMUNITY): Payer: Self-pay | Admitting: *Deleted

## 2013-12-19 ENCOUNTER — Inpatient Hospital Stay (EMERGENCY_DEPARTMENT_HOSPITAL)
Admission: AD | Admit: 2013-12-19 | Discharge: 2013-12-20 | Disposition: A | Discharge status: Home / Self Care | Payer: Medicaid Other | Source: Ambulatory Visit | Attending: Obstetrics & Gynecology | Admitting: Obstetrics & Gynecology

## 2013-12-19 DIAGNOSIS — Z3A12 12 weeks gestation of pregnancy: Secondary | ICD-10-CM | POA: Insufficient documentation

## 2013-12-19 DIAGNOSIS — O219 Vomiting of pregnancy, unspecified: Secondary | ICD-10-CM

## 2013-12-19 DIAGNOSIS — O209 Hemorrhage in early pregnancy, unspecified: Secondary | ICD-10-CM | POA: Diagnosis present

## 2013-12-19 DIAGNOSIS — O218 Other vomiting complicating pregnancy: Secondary | ICD-10-CM

## 2013-12-19 LAB — OB RESULTS CONSOLE HIV ANTIBODY (ROUTINE TESTING): HIV: NONREACTIVE

## 2013-12-19 LAB — URINE MICROSCOPIC-ADD ON

## 2013-12-19 LAB — URINALYSIS, ROUTINE W REFLEX MICROSCOPIC
Bilirubin Urine: NEGATIVE
Glucose, UA: NEGATIVE mg/dL
Ketones, ur: NEGATIVE mg/dL
Leukocytes, UA: NEGATIVE
Nitrite: NEGATIVE
Protein, ur: NEGATIVE mg/dL
Specific Gravity, Urine: 1.025 (ref 1.005–1.030)
Urobilinogen, UA: 0.2 mg/dL (ref 0.0–1.0)
pH: 6 (ref 5.0–8.0)

## 2013-12-19 LAB — OB RESULTS CONSOLE HEPATITIS B SURFACE ANTIGEN: Hepatitis B Surface Ag: NEGATIVE

## 2013-12-19 LAB — OB RESULTS CONSOLE RUBELLA ANTIBODY, IGM: Rubella: IMMUNE

## 2013-12-19 MED ORDER — PROMETHAZINE HCL 25 MG PO TABS: 25.0000 mg | ORAL_TABLET | Freq: Four times a day (QID) | ORAL | Status: DC | PRN

## 2013-12-19 NOTE — MAU Note (Addendum)
PT  SAYS SHE HAS   TWINS  WITH THIS PREG  AND HAS   BEEN  N/V   DURING PREG .      TOOK  PHENERGAN   1 TAB AT 1955-  THEN VOMITED.     SHE TAKES  PHENERGAN 1 PER DAY.       APPOINTMENT  WITH [facility]    ON 10-29  AT 0845.      LAST SEX-   10-17.     ALSO SAYS  SHE HAS  RIGHT SIDE ABD PAIN-  STARTED  IN SEPT

## 2013-12-19 NOTE — MAU Provider Note (Signed)
History     CSN: [id number]  Arrival date and time: 12/19/13 1936   None     No chief complaint on file.  HPI Sherri [name] 28 y.o. G1P0 @[redacted]w[redacted]d  presents to MAU complaining of nausea and vomiting and pain in her side.  The pain in her side has been ongoing since August.  The nausea and vomiting and have been ongoing since August also but has been worse today.  She has vomited 3-4 times during the day.  She took Reglan first thing this morning and again at 7:55 pm.  It has not helped.  She notes increase in nausea during the night and often wakes after falling asleep to vomit.    She denies unusual vaginal discharge, vaginal bleeding, dysuria.    OB History   Grav Para Term Preterm Abortions TAB SAB Ect Mult Living   1               Past Medical History  Diagnosis Date  . Uterine fibroid     Past Surgical History  Procedure Laterality Date  . No past surgeries      No family history on file.  History  Substance Use Topics  . Smoking status: Never Smoker   . Smokeless tobacco: Never Used  . Alcohol Use: No     Comment: occasional    Allergies: No Known Allergies  Prescriptions prior to admission  Medication Sig Dispense Refill  . acetaminophen (TYLENOL) 325 MG tablet Take 325-650 mg by mouth every 6 (six) hours as needed for mild pain or headache.      . metoCLOPramide (REGLAN) 10 MG tablet Take 1 tablet (10 mg total) by mouth every 6 (six) hours.  30 tablet  0  . Prenatal Vit-Fe Fumarate-FA (PRENATAL MULTIVITAMIN) TABS tablet Take 1 tablet by mouth daily at 12 noon.        Review of Systems  Constitutional: Negative for fever, chills and diaphoresis.  HENT: Negative for congestion.   Respiratory: Negative for cough and shortness of breath.   Cardiovascular: Negative for chest pain and palpitations.  Gastrointestinal: Positive for nausea and vomiting. Negative for heartburn and abdominal pain.  Genitourinary: Positive for frequency. Negative for dysuria.   Neurological: Negative for weakness and headaches.  Psychiatric/Behavioral: Negative for depression, suicidal ideas and substance abuse. The patient is not nervous/anxious.    Physical Exam   Blood pressure 132/79, pulse 71, temperature 99 F (37.2 C), temperature source Oral, resp. rate 18, height 5\' 10"  (1.778 m), weight 161 lb (73.029 kg), last menstrual period 10/03/2013.  Physical Exam  Constitutional: She is oriented to person, place, and time. She appears well-developed and well-nourished.  HENT:  Head: Normocephalic and atraumatic.  Neck: Normal range of motion.  Cardiovascular: Normal rate and regular rhythm.   Respiratory: Effort normal and breath sounds normal. No respiratory distress.  GI: Soft. Bowel sounds are normal. She exhibits no distension. There is no tenderness.  Musculoskeletal: Normal range of motion.  Neurological: She is alert and oriented to person, place, and time.  Skin: Skin is warm and dry.  Psychiatric: She has a normal mood and affect.   Results for orders placed during the hospital encounter of 12/19/13 (from the past 24 hour(s))  URINALYSIS, ROUTINE W REFLEX MICROSCOPIC     Status: Abnormal   Collection Time    12/19/13  8:10 PM      Result Value Ref Range   Color, Urine YELLOW  YELLOW   APPearance CLEAR  CLEAR   Specific Gravity, Urine 1.025  1.005 - 1.030   pH 6.0  5.0 - 8.0   Glucose, UA NEGATIVE  NEGATIVE mg/dL   Hgb urine dipstick TRACE (*) NEGATIVE   Bilirubin Urine NEGATIVE  NEGATIVE   Ketones, ur NEGATIVE  NEGATIVE mg/dL   Protein, ur NEGATIVE  NEGATIVE mg/dL   Urobilinogen, UA 0.2  0.0 - 1.0 mg/dL   Nitrite NEGATIVE  NEGATIVE   Leukocytes, UA NEGATIVE  NEGATIVE  URINE MICROSCOPIC-ADD ON     Status: Abnormal   Collection Time    12/19/13  8:10 PM      Result Value Ref Range   Squamous Epithelial / LPF FEW (*) RARE   WBC, UA 0-2  <3 WBC/hpf   RBC / HPF 0-2  <3 RBC/hpf   Bacteria, UA FEW (*) RARE   Crystals CA OXALATE CRYSTALS  (*) NEGATIVE   Fetal heart tones x 2 heard. MAU Course  Procedures  MDM Pt has not vomited during MAU stay.  She is agreeable to trial of outpatient phenergan.    Assessment and Plan  A: nausea and vomiting in pregnancy  P: Discharge to home Phenergan 25mg  po prn at bedtime  Nausea medication to take during pregnancy:   Unisom (doxylamine succinate 25 mg tablets) Take one tablet daily at bedtime. If symptoms are not adequately controlled, the dose can be increased to a maximum recommended dose of two tablets daily (1/2 tablet in the morning, 1/2 tablet mid-afternoon and one at bedtime).  Vitamin B6 100mg  tablets. Take one tablet twice a day (up to 200 mg per day). Keep [facility] appt next week Return to MAU as needed for emergency   Paticia Stack 12/19/2013, 11:38 PM

## 2013-12-19 NOTE — Discharge Instructions (Signed)
Nausea medication to take during pregnancy:  ° °Unisom (doxylamine succinate 25 mg tablets) Take one tablet daily at bedtime. If symptoms are not adequately controlled, the dose can be increased to a maximum recommended dose of two tablets daily (1/2 tablet in the morning, 1/2 tablet mid-afternoon and one at bedtime). ° °Vitamin B6 100mg tablets. Take one tablet twice a day (up to 200 mg per day). ° ° ° °Morning Sickness °Morning sickness is when you feel sick to your stomach (nauseous) during pregnancy. This nauseous feeling may or may not come with vomiting. It often occurs in the morning but can be a problem any time of day. Morning sickness is most common during the first trimester, but it may continue throughout pregnancy. While morning sickness is unpleasant, it is usually harmless unless you develop severe and continual vomiting (hyperemesis gravidarum). This condition requires more intense treatment.  °CAUSES  °The cause of morning sickness is not completely known but seems to be related to normal hormonal changes that occur in pregnancy. °RISK FACTORS °You are at greater risk if you: °· Experienced nausea or vomiting before your pregnancy. °· Had morning sickness during a previous pregnancy. °· Are pregnant with more than one baby, such as twins. °TREATMENT  °Do not use any medicines (prescription, over-the-counter, or herbal) for morning sickness without first talking to your health care provider. Your health care provider may prescribe or recommend: °· Vitamin B6 supplements. °· Anti-nausea medicines. °· The herbal medicine ginger. °HOME CARE INSTRUCTIONS  °· Only take over-the-counter or prescription medicines as directed by your health care provider. °· Taking multivitamins before getting pregnant can prevent or decrease the severity of morning sickness in most women. °· Eat a piece of dry toast or unsalted crackers before getting out of bed in the morning. °· Eat five or six small meals a day. °· Eat  dry and bland foods (rice, baked potato). Foods high in carbohydrates are often helpful. °· Do not drink liquids with your meals. Drink liquids between meals. °· Avoid greasy, fatty, and spicy foods. °· Get someone to cook for you if the smell of any food causes nausea and vomiting. °· If you feel nauseous after taking prenatal vitamins, take the vitamins at night or with a snack.  °· Snack on protein foods (nuts, yogurt, cheese) between meals if you are hungry. °· Eat unsweetened gelatins for desserts. °· Wearing an acupressure wristband (worn for sea sickness) may be helpful. °· Acupuncture may be helpful. °· Do not smoke. °· Get a humidifier to keep the air in your house free of odors. °· Get plenty of fresh air. °SEEK MEDICAL CARE IF:  °· Your home remedies are not working, and you need medicine. °· You feel dizzy or lightheaded. °· You are losing weight. °SEEK IMMEDIATE MEDICAL CARE IF:  °· You have persistent and uncontrolled nausea and vomiting. °· You pass out (faint). °MAKE SURE YOU: °· Understand these instructions. °· Will watch your condition. °· Will get help right away if you are not doing well or get worse. °Document Released: 04/09/2006 Document Revised: 02/21/2013 Document Reviewed: 08/03/2012 °ExitCare® Patient Information ©2015 [employer]. This information is not intended to replace advice given to you by your health care provider. Make sure you discuss any questions you have with your health care provider. ° °

## 2013-12-20 ENCOUNTER — Encounter (HOSPITAL_COMMUNITY): Payer: Self-pay | Admitting: *Deleted

## 2013-12-20 ENCOUNTER — Inpatient Hospital Stay (HOSPITAL_COMMUNITY): Payer: Medicaid Other

## 2013-12-20 ENCOUNTER — Inpatient Hospital Stay (HOSPITAL_COMMUNITY)
Admission: AD | Admit: 2013-12-20 | Discharge: 2013-12-20 | Disposition: A | Discharge status: Home / Self Care | Payer: Medicaid Other | Source: Ambulatory Visit | Attending: Obstetrics & Gynecology | Admitting: Obstetrics & Gynecology

## 2013-12-20 ENCOUNTER — Inpatient Hospital Stay (EMERGENCY_DEPARTMENT_HOSPITAL)
Admission: AD | Admit: 2013-12-20 | Discharge: 2013-12-20 | Disposition: A | Discharge status: Home / Self Care | Payer: Medicaid Other | Source: Ambulatory Visit | Attending: Obstetrics & Gynecology | Admitting: Obstetrics & Gynecology

## 2013-12-20 ENCOUNTER — Encounter (HOSPITAL_COMMUNITY): Payer: Self-pay

## 2013-12-20 DIAGNOSIS — R102 Pelvic and perineal pain: Secondary | ICD-10-CM | POA: Insufficient documentation

## 2013-12-20 DIAGNOSIS — K625 Hemorrhage of anus and rectum: Secondary | ICD-10-CM

## 2013-12-20 DIAGNOSIS — Z3A12 12 weeks gestation of pregnancy: Secondary | ICD-10-CM

## 2013-12-20 DIAGNOSIS — O26899 Other specified pregnancy related conditions, unspecified trimester: Secondary | ICD-10-CM

## 2013-12-20 DIAGNOSIS — O26851 Spotting complicating pregnancy, first trimester: Secondary | ICD-10-CM | POA: Insufficient documentation

## 2013-12-20 DIAGNOSIS — O26891 Other specified pregnancy related conditions, first trimester: Secondary | ICD-10-CM

## 2013-12-20 DIAGNOSIS — O4692 Antepartum hemorrhage, unspecified, second trimester: Secondary | ICD-10-CM

## 2013-12-20 LAB — CBC
HCT: 32.4 % — ABNORMAL LOW (ref 36.0–46.0)
Hemoglobin: 10.9 g/dL — ABNORMAL LOW (ref 12.0–15.0)
MCH: 29.4 pg (ref 26.0–34.0)
MCHC: 33.6 g/dL (ref 30.0–36.0)
MCV: 87.3 fL (ref 78.0–100.0)
Platelets: 212 10*3/uL (ref 150–400)
RBC: 3.71 MIL/uL — ABNORMAL LOW (ref 3.87–5.11)
RDW: 14.8 % (ref 11.5–15.5)
WBC: 7.8 10*3/uL (ref 4.0–10.5)

## 2013-12-20 NOTE — MAU Note (Signed)
Pt to MAU via EMS with C/O bright red vaginal bleeding and cramping. Denies ROM/LOF.

## 2013-12-20 NOTE — MAU Note (Signed)
Urine in lab 

## 2013-12-20 NOTE — MAU Provider Note (Signed)
History     CSN: [id number]  Arrival date and time: 12/20/13 4098   First Provider Initiated Contact with Patient 12/20/13 0732      Chief Complaint  Patient presents with  . Vaginal Bleeding  . Abdominal Cramping   HPI Sherri [name] 28 y.o. G1P0 @[redacted]w[redacted]d  presents to MAU with complaint of bright red blood that filled up her panties at 6:30 this morning.  It did not soak through to the bedclothes.  She has no pain associated with this.  She was just seen last night for nausea and vomiting.  She has not had any nausea or vomiting.  She also denies fever, weakness, dizziness, dysuria.     OB History   Grav Para Term Preterm Abortions TAB SAB Ect Mult Living   1               Past Medical History  Diagnosis Date  . Uterine fibroid     Past Surgical History  Procedure Laterality Date  . No past surgeries      No family history on file.  History  Substance Use Topics  . Smoking status: Never Smoker   . Smokeless tobacco: Never Used  . Alcohol Use: No     Comment: occasional    Allergies: No Known Allergies  Prescriptions prior to admission  Medication Sig Dispense Refill  . acetaminophen (TYLENOL) 325 MG tablet Take 325-650 mg by mouth every 6 (six) hours as needed for mild pain or headache.      . metoCLOPramide (REGLAN) 10 MG tablet Take 1 tablet (10 mg total) by mouth every 6 (six) hours.  30 tablet  0  . Prenatal Vit-Fe Fumarate-FA (PRENATAL MULTIVITAMIN) TABS tablet Take 1 tablet by mouth daily at 12 noon.      . promethazine (PHENERGAN) 25 MG tablet Take 1 tablet (25 mg total) by mouth every 6 (six) hours as needed for nausea or vomiting.  30 tablet  0   Results for orders placed during the hospital encounter of 12/19/13 (from the past 48 hour(s))  URINALYSIS, ROUTINE W REFLEX MICROSCOPIC     Status: Abnormal   Collection Time    12/19/13  8:10 PM      Result Value Ref Range   Color, Urine YELLOW  YELLOW   APPearance CLEAR  CLEAR   Specific Gravity,  Urine 1.025  1.005 - 1.030   pH 6.0  5.0 - 8.0   Glucose, UA NEGATIVE  NEGATIVE mg/dL   Hgb urine dipstick TRACE (*) NEGATIVE   Bilirubin Urine NEGATIVE  NEGATIVE   Ketones, ur NEGATIVE  NEGATIVE mg/dL   Protein, ur NEGATIVE  NEGATIVE mg/dL   Urobilinogen, UA 0.2  0.0 - 1.0 mg/dL   Nitrite NEGATIVE  NEGATIVE   Leukocytes, UA NEGATIVE  NEGATIVE  URINE MICROSCOPIC-ADD ON     Status: Abnormal   Collection Time    12/19/13  8:10 PM      Result Value Ref Range   Squamous Epithelial / LPF FEW (*) RARE   WBC, UA 0-2  <3 WBC/hpf   RBC / HPF 0-2  <3 RBC/hpf   Bacteria, UA FEW (*) RARE   Crystals CA OXALATE CRYSTALS (*) NEGATIVE   US Ob Comp Less 14 Wks  12/20/2013   CLINICAL DATA:  Vaginal bleeding. First trimester pregnancy. Twin gestation.  EXAM: TWIN OBSTETRICAL ULTRASOUND <14 WKS  COMPARISON:  12/10/2013  FINDINGS: TWIN 1  Intrauterine gestational sac: Visualized/normal in shape.  Yolk sac:  Not  visualized  Embryo:  Visualized  Cardiac Activity: Visualized  Heart Rate: 167 bpm  MSD:   mm    w     d  CRL:   54.4  mm   12 w 1 d                  [place] EDC: 07/03/2014  TWIN 2  Intrauterine gestational sac: Visualized/normal in shape.  Yolk sac:  Not visualized  Embryo:  Visualized  Cardiac Activity: Visualized  Heart Rate: 161 bpm  MSD:   mm    w     d  CRL:   54.4  mm   12 w 1 d                  [place] EDC: 07/03/2014  Maternal uterus/adnexae: No subchorionic hemorrhage. 5.3 cm right lateral fibroid noted. Normal appearance of the ovaries. No free fluid.  IMPRESSION: Twin gestation with living embryos at estimated gestational age [redacted] weeks 1 day. No acute maternal findings.   Electronically Signed   By: Rolm Baptise M.D.   On: 12/20/2013 09:14   US Ob Comp Addl Gest Less 14 Wks  12/20/2013   CLINICAL DATA:  Vaginal bleeding. First trimester pregnancy. Twin gestation.  EXAM: TWIN OBSTETRICAL ULTRASOUND <14 WKS  COMPARISON:  12/10/2013  FINDINGS: TWIN 1  Intrauterine gestational sac: Visualized/normal in  shape.  Yolk sac:  Not visualized  Embryo:  Visualized  Cardiac Activity: Visualized  Heart Rate: 167 bpm  MSD:   mm    w     d  CRL:   54.4  mm   12 w 1 d                  [place] EDC: 07/03/2014  TWIN 2  Intrauterine gestational sac: Visualized/normal in shape.  Yolk sac:  Not visualized  Embryo:  Visualized  Cardiac Activity: Visualized  Heart Rate: 161 bpm  MSD:   mm    w     d  CRL:   54.4  mm   12 w 1 d                  [place] EDC: 07/03/2014  Maternal uterus/adnexae: No subchorionic hemorrhage. 5.3 cm right lateral fibroid noted. Normal appearance of the ovaries. No free fluid.  IMPRESSION: Twin gestation with living embryos at estimated gestational age [redacted] weeks 1 day. No acute maternal findings.   Electronically Signed   By: Rolm Baptise M.D.   On: 12/20/2013 09:14    ROS Pertinent ROS in HPI Physical Exam   Blood pressure 129/71, pulse 85, temperature 98.5 F (36.9 C), temperature source Oral, resp. rate 20, last menstrual period 10/03/2013, SpO2 100.00%.  Physical Exam  Constitutional: She is oriented to person, place, and time. She appears well-developed and well-nourished.  HENT:  Head: Normocephalic and atraumatic.  Eyes: EOM are normal.  Neck: Normal range of motion.  Cardiovascular: Normal rate, regular rhythm and normal heart sounds.   Respiratory: Effort normal and breath sounds normal. No respiratory distress.  GI: Soft. Bowel sounds are normal. She exhibits no distension.  Genitourinary:  External exam of genitalia shows staining of blood on skin but none soaking into pad.  Musculoskeletal: Normal range of motion.  Neurological: She is alert and oriented to person, place, and time.  Skin: Skin is warm and dry.  Psychiatric: She has a normal mood and affect.    MAU Course  Procedures None  MDM U/S ordered.  Report given and care turned over to Sherri Saupe, Sherri [name] at 8:07am.   Sherri Dimmer, Sherri [name]  Assessment and Plan  A: Vaginal bleeding in second trimester of  pregnancy; unknown source of bleeding at this time. Fibroid  A negative blood type, patient has already received rhogam given on 12/17/2013  P: Discharge home in stable condition Pelvic rest Bleeding precautions First trimester warning signs discussed Start prenatal care ASAP  Sherri [name] 12/20/2013, 7:34 AM   Resumed care of the patient at 8:07; awaiting [place]. Patient denies pain.   Sherri Hillock Rasch, Sherri [name] 12/20/2013 3:02 PM

## 2013-12-20 NOTE — MAU Provider Note (Signed)
Attestation of Attending Supervision of Advanced Practitioner (PA/CNM/NP): Evaluation and management procedures were performed by the Advanced Practitioner under my supervision and collaboration.  I have reviewed the Advanced Practitioner's note and chart, and I agree with the management and plan.  Taiwan Talcott, MD, FACOG Attending Obstetrician & Gynecologist Faculty Practice, [facility]   

## 2013-12-20 NOTE — MAU Note (Signed)
Pt states bright red vaginal bleeding & abdominal cramping has continued since being seen in MAU this morning. Denies changes since then but is concerned b/c bleeding has continued. Vaginal bleeding is still bright red, no clots; increased mid day but has decreased since then, not saturating pads.

## 2013-12-20 NOTE — MAU Provider Note (Signed)
Attestation of Attending Supervision of Advanced Practitioner (CNM/NP): Evaluation and management procedures were performed by the Advanced Practitioner under my supervision and collaboration.  I have reviewed the Advanced Practitioner's note and chart, and I agree with the management and plan.  Analaya Hoey 12/20/2013 4:04 PM

## 2013-12-20 NOTE — MAU Provider Note (Signed)
History     CSN: [id number]  Arrival date and time: 12/20/13 7680   First Provider Initiated Contact with Patient 12/20/13 2043      Chief Complaint  Patient presents with  . Vaginal Bleeding   Vaginal Bleeding    Sherri [name] is a 28 y.o. G1P0 at [redacted]w[redacted]d who presents today with spotting. She states that she was seen here this morning, and she has been bleeding since she left. She states that she was bleeding heavily throughout the day, but it has slowed down now. She also has right sided abdominal pain. She denies any fever, nausea vomiting or diarrhea. She has an appointment to start [facility] on 12/28/13. She had Rhogam on 12/17/13.   Past Medical History  Diagnosis Date  . Uterine fibroid     Past Surgical History  Procedure Laterality Date  . No past surgeries      History reviewed. No pertinent family history.  History  Substance Use Topics  . Smoking status: Never Smoker   . Smokeless tobacco: Never Used  . Alcohol Use: No     Comment: occasional    Allergies: No Known Allergies  Prescriptions prior to admission  Medication Sig Dispense Refill  . acetaminophen (TYLENOL) 325 MG tablet Take 325-650 mg by mouth every 6 (six) hours as needed for mild pain or headache.      . metoCLOPramide (REGLAN) 10 MG tablet Take 10 mg by mouth every 6 (six) hours as needed for nausea.      . Prenatal Vit-Fe Fumarate-FA (PRENATAL MULTIVITAMIN) TABS tablet Take 1 tablet by mouth daily at 12 noon.      . promethazine (PHENERGAN) 25 MG tablet Take 1 tablet (25 mg total) by mouth every 6 (six) hours as needed for nausea or vomiting.  30 tablet  0    Review of Systems  Genitourinary: Positive for vaginal bleeding.   Physical Exam   Blood pressure 136/71, pulse 75, temperature 99.1 F (37.3 C), temperature source Oral, resp. rate 18, height 4\' 11"  (1.499 m), weight 72.213 kg (159 lb 3.2 oz), last menstrual period 10/03/2013, SpO2 100.00%.  Physical Exam  Nursing note and vitals  reviewed. Constitutional: She is oriented to person, place, and time. She appears well-developed and well-nourished. No distress.  Cardiovascular: Normal rate.   Respiratory: Effort normal.  GI: Soft. There is no tenderness. There is no rebound and no guarding.  Genitourinary:   External: no lesion Vagina: scant amount of brown blood seen  Cervix: pink, smooth, no CMT, closed/thick  Uterus: 16 weeks size, AGA FHT: 161, 165   Neurological: She is alert and oriented to person, place, and time.  Skin: Skin is warm and dry.  Psychiatric: She has a normal mood and affect.    MAU Course  Procedures  Results for orders placed during the hospital encounter of 12/20/13 (from the past 24 hour(s))  CBC     Status: Abnormal   Collection Time    12/20/13  8:55 PM      Result Value Ref Range   WBC 7.8  4.0 - 10.5 K/uL   RBC 3.71 (*) 3.87 - 5.11 MIL/uL   Hemoglobin 10.9 (*) 12.0 - 15.0 g/dL   HCT 32.4 (*) 36.0 - 46.0 %   MCV 87.3  78.0 - 100.0 fL   MCH 29.4  26.0 - 34.0 pg   MCHC 33.6  30.0 - 36.0 g/dL   RDW 14.8  11.5 - 15.5 %   Platelets 212  150 - 400 K/uL   Assessment and Plan   1. Pain of round ligament affecting pregnancy, antepartum   2. Spotting during pregnancy in first trimester    Comfort measures reviewed  First trimester precautions reviewed [facility] as planned  Follow-up Information   Follow up with [facility]. (As scheduled)    Specialty:  Obstetrics and Gynecology   Contact information:   [place] [address] [phone]       Dequita, Schleicher 12/20/2013, 8:55 PM

## 2013-12-20 NOTE — Discharge Instructions (Signed)

## 2013-12-22 ENCOUNTER — Ambulatory Visit (HOSPITAL_COMMUNITY)
Admission: RE | Admit: 2013-12-22 | Discharge: 2013-12-22 | Disposition: A | Discharge status: Home / Self Care | Payer: Medicaid Other | Source: Ambulatory Visit | Attending: Physician Assistant | Admitting: Physician Assistant

## 2013-12-22 ENCOUNTER — Encounter (HOSPITAL_COMMUNITY): Payer: Self-pay

## 2013-12-22 VITALS — BP 140/74 | HR 99 | Wt 159.0 lb

## 2013-12-22 DIAGNOSIS — IMO0001 Reserved for inherently not codable concepts without codable children: Secondary | ICD-10-CM

## 2013-12-22 DIAGNOSIS — Z3A12 12 weeks gestation of pregnancy: Secondary | ICD-10-CM

## 2013-12-22 DIAGNOSIS — Z3682 Encounter for antenatal screening for nuchal translucency: Secondary | ICD-10-CM

## 2013-12-22 LAB — SCREEN, FIRST TRIMESTER, SERUM
FIRST TRIMESTER SAMPLE: NORMAL
FIRST TRIMESTER SAMPLE: NORMAL

## 2013-12-22 NOTE — ED Notes (Signed)
Pt having right side pain and bleeding last night.  Denies recent intercourse.  No bleeding today.

## 2013-12-25 DIAGNOSIS — Z6791 Unspecified blood type, Rh negative: Secondary | ICD-10-CM

## 2013-12-25 DIAGNOSIS — O30002 Twin pregnancy, unspecified number of placenta and unspecified number of amniotic sacs, second trimester: Secondary | ICD-10-CM

## 2013-12-25 DIAGNOSIS — O30039 Twin pregnancy, monochorionic/diamniotic, unspecified trimester: Secondary | ICD-10-CM | POA: Insufficient documentation

## 2013-12-25 DIAGNOSIS — E669 Obesity, unspecified: Secondary | ICD-10-CM

## 2013-12-25 DIAGNOSIS — O360120 Maternal care for anti-D [Rh] antibodies, second trimester, not applicable or unspecified: Secondary | ICD-10-CM

## 2013-12-25 DIAGNOSIS — D259 Leiomyoma of uterus, unspecified: Secondary | ICD-10-CM | POA: Insufficient documentation

## 2013-12-25 DIAGNOSIS — O26899 Other specified pregnancy related conditions, unspecified trimester: Secondary | ICD-10-CM | POA: Insufficient documentation

## 2013-12-28 ENCOUNTER — Encounter: Payer: Self-pay | Admitting: Obstetrics & Gynecology

## 2013-12-28 ENCOUNTER — Encounter: Payer: Self-pay | Admitting: *Deleted

## 2013-12-28 ENCOUNTER — Other Ambulatory Visit (HOSPITAL_COMMUNITY)
Admission: RE | Admit: 2013-12-28 | Discharge: 2013-12-28 | Disposition: A | Discharge status: Home / Self Care | Payer: Medicaid Other | Source: Ambulatory Visit | Attending: Obstetrics & Gynecology | Admitting: Obstetrics & Gynecology

## 2013-12-28 ENCOUNTER — Ambulatory Visit (INDEPENDENT_AMBULATORY_CARE_PROVIDER_SITE_OTHER): Payer: Medicaid Other | Admitting: Obstetrics & Gynecology

## 2013-12-28 VITALS — BP 122/93 | HR 83 | Temp 97.5°F | Wt 160.2 lb

## 2013-12-28 DIAGNOSIS — O30032 Twin pregnancy, monochorionic/diamniotic, second trimester: Secondary | ICD-10-CM

## 2013-12-28 DIAGNOSIS — N760 Acute vaginitis: Secondary | ICD-10-CM | POA: Diagnosis present

## 2013-12-28 DIAGNOSIS — N898 Other specified noninflammatory disorders of vagina: Secondary | ICD-10-CM

## 2013-12-28 DIAGNOSIS — O3680X0 Pregnancy with inconclusive fetal viability, not applicable or unspecified: Secondary | ICD-10-CM

## 2013-12-28 DIAGNOSIS — O360120 Maternal care for anti-D [Rh] antibodies, second trimester, not applicable or unspecified: Secondary | ICD-10-CM

## 2013-12-28 LAB — POCT URINALYSIS DIP (DEVICE)
Bilirubin Urine: NEGATIVE
Glucose, UA: NEGATIVE mg/dL
Ketones, ur: NEGATIVE mg/dL
Nitrite: NEGATIVE
Protein, ur: NEGATIVE mg/dL
Specific Gravity, Urine: 1.015 (ref 1.005–1.030)
Urobilinogen, UA: 0.2 mg/dL (ref 0.0–1.0)
pH: 6 (ref 5.0–8.0)

## 2013-12-28 LAB — US OB COMP LESS 14 WKS

## 2013-12-28 MED ORDER — ONDANSETRON HCL 4 MG PO TABS: 4.0000 mg | ORAL_TABLET | Freq: Three times a day (TID) | ORAL | Status: DC | PRN

## 2013-12-28 NOTE — Progress Notes (Signed)
Here for initial prenatal visit [facility].  C/o has had several episodes of vaginal bleeding and has came to MAU several times- last episode 12/20/13. C/o vaginal itching- and thick, white discharge. Given new patient information packet.

## 2013-12-28 NOTE — Progress Notes (Signed)
Nutrition note: 1st visit consult Pt is having twins & has h/o obesity. Pt has gained 10.2# @ 13w, which per twin gestation growth chart, wt gain is > expected so far. Pt reports eating only 1-2 meals/d due to N/V daily. Pt stated she was taking phenergan but it was not working; per MD notes, pt will start zofran soon. Pt is taking a PNV. Pt reports no heartburn. NKFA. Pt reports walking 1-2 x/wk for ~30 mins. Pt received verbal & written education on nutrition during pregnancy with twins. Discussed benefits of BF & options for pumping with twins. Discussed wt gain goals of 25-42# or 1#/wk in 2nd & 3rd trimester. Pt agrees to continue to take PNV & as able/ as nausea decreases, pt agrees to try to eat small frequent meals. Pt has Ch[place] & plans to BF. F/u as needed [name], MS, RD, LDN, [facility]

## 2013-12-28 NOTE — Progress Notes (Signed)
Bedside US for viability = Twin IUP w/separating membrane visualized.  Twin A FHR = 160 per PW doppler, Twin B FHR = 158 per PW doppler, FM present

## 2013-12-28 NOTE — Progress Notes (Signed)
Mono/di twins, followed by MFM.  Nml Nuchal.  Needs APF only at 16 weeks.  Pt c/o nausea.  Now that out of 1st trimester will add zofran.  Pt also c/o leg pain and RL pain.  LE are nontender and no edema.  Suggest support hose.  Pt reassured about RL pain and given handout on common complaints of pregnancy.  Pt asked to be written out of work but I don't think it is necessary at this time.  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  Watch BP at next visit.  Will need to determine if pt has CHTN.  BD affirm for vaginal discharge.

## 2013-12-28 NOTE — Patient Instructions (Signed)
Buy Moderate Medical Support hose to help with leg discomfort.  Buy shoes with better arch support.

## 2013-12-30 ENCOUNTER — Encounter (HOSPITAL_COMMUNITY): Payer: Self-pay | Admitting: *Deleted

## 2013-12-30 ENCOUNTER — Inpatient Hospital Stay (HOSPITAL_COMMUNITY)
Admission: AD | Admit: 2013-12-30 | Discharge: 2013-12-30 | Disposition: A | Discharge status: Home / Self Care | Payer: Medicaid Other | Source: Ambulatory Visit | Attending: Family Medicine | Admitting: Family Medicine

## 2013-12-30 DIAGNOSIS — B373 Candidiasis of vulva and vagina: Secondary | ICD-10-CM

## 2013-12-30 DIAGNOSIS — O30001 Twin pregnancy, unspecified number of placenta and unspecified number of amniotic sacs, first trimester: Secondary | ICD-10-CM | POA: Diagnosis not present

## 2013-12-30 DIAGNOSIS — R109 Unspecified abdominal pain: Secondary | ICD-10-CM | POA: Diagnosis not present

## 2013-12-30 DIAGNOSIS — Z3A13 13 weeks gestation of pregnancy: Secondary | ICD-10-CM | POA: Insufficient documentation

## 2013-12-30 DIAGNOSIS — O26899 Other specified pregnancy related conditions, unspecified trimester: Secondary | ICD-10-CM

## 2013-12-30 DIAGNOSIS — O2342 Unspecified infection of urinary tract in pregnancy, second trimester: Secondary | ICD-10-CM | POA: Diagnosis present

## 2013-12-30 DIAGNOSIS — B3731 Acute candidiasis of vulva and vagina: Secondary | ICD-10-CM

## 2013-12-30 LAB — CBC
HCT: 33.5 % — ABNORMAL LOW (ref 36.0–46.0)
Hemoglobin: 11.3 g/dL — ABNORMAL LOW (ref 12.0–15.0)
MCH: 29.5 pg (ref 26.0–34.0)
MCHC: 33.7 g/dL (ref 30.0–36.0)
MCV: 87.5 fL (ref 78.0–100.0)
Platelets: 216 10*3/uL (ref 150–400)
RBC: 3.83 MIL/uL — ABNORMAL LOW (ref 3.87–5.11)
RDW: 14.5 % (ref 11.5–15.5)
WBC: 9.6 10*3/uL (ref 4.0–10.5)

## 2013-12-30 LAB — URINALYSIS, ROUTINE W REFLEX MICROSCOPIC
Bilirubin Urine: NEGATIVE
Glucose, UA: NEGATIVE mg/dL
Ketones, ur: NEGATIVE mg/dL
Nitrite: NEGATIVE
Protein, ur: NEGATIVE mg/dL
Specific Gravity, Urine: 1.015 (ref 1.005–1.030)
Urobilinogen, UA: 0.2 mg/dL (ref 0.0–1.0)
pH: 6.5 (ref 5.0–8.0)

## 2013-12-30 LAB — WET PREP, GENITAL
Clue Cells Wet Prep HPF POC: NONE SEEN
Trich, Wet Prep: NONE SEEN

## 2013-12-30 LAB — URINE MICROSCOPIC-ADD ON

## 2013-12-30 MED ORDER — TERCONAZOLE 0.4 % VA CREA: 1.0000 | TOPICAL_CREAM | Freq: Every day | VAGINAL | Status: DC

## 2013-12-30 MED ORDER — NITROFURANTOIN MONOHYD MACRO 100 MG PO CAPS: 100.0000 mg | ORAL_CAPSULE | Freq: Two times a day (BID) | ORAL | Status: DC

## 2013-12-30 MED ORDER — TRAMADOL HCL 50 MG PO TABS: 50.0000 mg | ORAL_TABLET | Freq: Four times a day (QID) | ORAL | Status: DC | PRN

## 2013-12-30 NOTE — MAU Note (Signed)
Pt arrives via EMS, ambulatory to bed. Reports rt lower abd pain x 3 hours, took tylenol without relief. Pain is worsening. Also reports burning with urination for the last 3 days.

## 2013-12-30 NOTE — MAU Provider Note (Signed)
Attestation of Attending Supervision of Advanced Practitioner (PA/CNM/NP): Evaluation and management procedures were performed by the Advanced Practitioner under my supervision and collaboration.  I have reviewed the Advanced Practitioner's note and chart, and I agree with the management and plan.  Donnamae Jude, MD [facility] Attending 12/30/2013 7:29 AM

## 2013-12-30 NOTE — MAU Provider Note (Signed)
Chief Complaint: No chief complaint on file.   First Provider Initiated Contact with Patient 12/30/13 0303     SUBJECTIVE HPI: Sherri [name] is a 28 y.o. G1P0 at [redacted]w[redacted]d by LMP who presents with: 1. Right mid/low abd pain x 3 hours 2. Dysuria, urgency and frequency x 3 days 3. Vaginal itching and thick discharge  Previous US's showed 5 cm right lateral fibroid. CT neg for appendicitis. Normal ovaries. GC/Chlamydia neg 10/11. Rates pain 9/10 on pain scale. Minimal relief w/ Tylenol. Denies fever, chills, diarrhea, constipation, flank pain, hematuria. Vomits ~ once each morning since early pregnancy. No change.   Past Medical History  Diagnosis Date  . Uterine fibroid    OB History  Gravida Para Term Preterm AB SAB TAB Ectopic Multiple Living  1             # Outcome Date GA Lbr Len/2nd Weight Sex Delivery Anes PTL Lv  1 CUR              Past Surgical History  Procedure Laterality Date  . No past surgeries     History   Social History  . Marital Status: Single    Spouse Name: N/A    Number of Children: N/A  . Years of Education: N/A   Occupational History  . Not on file.   Social History Main Topics  . Smoking status: Never Smoker   . Smokeless tobacco: Never Used  . Alcohol Use: No     Comment: occasional  . Drug Use: No  . Sexual Activity: Yes    Birth Control/ Protection: None   Other Topics Concern  . Not on file   Social History Narrative  . No narrative on file   No current facility-administered medications on file prior to encounter.   Current Outpatient Prescriptions on File Prior to Encounter  Medication Sig Dispense Refill  . acetaminophen (TYLENOL) 325 MG tablet Take 325-650 mg by mouth every 6 (six) hours as needed for mild pain or headache.      . Prenatal Vit-Fe Fumarate-FA (PRENATAL MULTIVITAMIN) TABS tablet Take 1 tablet by mouth at bedtime.       . promethazine (PHENERGAN) 25 MG tablet Take 1 tablet (25 mg total) by mouth every 6 (six) hours  as needed for nausea or vomiting.  30 tablet  0   No Known Allergies  ROS: Pertinent positive items in HPI. Neg for fever, chills, diarrhea, constipation, flank pain, hematuria. Vomits ~ once each morning since early pregnancy. No change.  OBJECTIVE Blood pressure 122/73, pulse 83, temperature 98.3 F (36.8 C), temperature source Oral, resp. rate 16, height 4\' 11"  (1.499 m), weight 72.576 kg (160 lb), last menstrual period 10/03/2013. GENERAL: Well-developed, well-nourished female in no acute distress.  HEENT: Normocephalic HEART: normal rate RESP: normal effort ABDOMEN: Soft, mild tenderness at border of right ilial crest. Neg rebound or mass. No in area of uterus. No CVAT.   EXTREMITIES: Nontender, no edema NEURO: Alert and oriented PELVIC EXAM: NEFG, large amount of thick, white, curdlike discharge, no blood noted, cervix long and closed. No adnexal tenderness or masses.  FHR 156/164 by doppler.  LAB RESULTS Results for orders placed during the hospital encounter of 12/30/13 (from the past 24 hour(s))  URINALYSIS, ROUTINE W REFLEX MICROSCOPIC     Status: Abnormal   Collection Time    12/30/13  1:49 AM      Result Value Ref Range   Color, Urine YELLOW  YELLOW  APPearance CLEAR  CLEAR   Specific Gravity, Urine 1.015  1.005 - 1.030   pH 6.5  5.0 - 8.0   Glucose, UA NEGATIVE  NEGATIVE mg/dL   Hgb urine dipstick TRACE (*) NEGATIVE   Bilirubin Urine NEGATIVE  NEGATIVE   Ketones, ur NEGATIVE  NEGATIVE mg/dL   Protein, ur NEGATIVE  NEGATIVE mg/dL   Urobilinogen, UA 0.2  0.0 - 1.0 mg/dL   Nitrite NEGATIVE  NEGATIVE   Leukocytes, UA LARGE (*) NEGATIVE  URINE MICROSCOPIC-ADD ON     Status: Abnormal   Collection Time    12/30/13  1:49 AM      Result Value Ref Range   Squamous Epithelial / LPF FEW (*) RARE   WBC, UA 11-20  <3 WBC/hpf   RBC / HPF 3-6  <3 RBC/hpf   Bacteria, UA FEW (*) RARE  CBC     Status: Abnormal   Collection Time    12/30/13  3:15 AM      Result Value Ref  Range   WBC 9.6  4.0 - 10.5 K/uL   RBC 3.83 (*) 3.87 - 5.11 MIL/uL   Hemoglobin 11.3 (*) 12.0 - 15.0 g/dL   HCT 33.5 (*) 36.0 - 46.0 %   MCV 87.5  78.0 - 100.0 fL   MCH 29.5  26.0 - 34.0 pg   MCHC 33.7  30.0 - 36.0 g/dL   RDW 14.5  11.5 - 15.5 %   Platelets 216  150 - 400 K/uL    IMAGING Mr Pelvis Wo Contrast  12/10/2013   CLINICAL DATA:  Right lower quadrant pain for 3-4 days. Nausea. Vomiting. Nine weeks pregnant.  EXAM: MRI ABDOMEN AND PELVIS WITHOUT CONTRAST  TECHNIQUE: Multiplanar multisequence MR imaging of the abdomen and pelvis was performed. No intravenous contrast was administered.  COMPARISON:  CT on 10/11/2013  FINDINGS: MRI ABDOMEN FINDINGS  Visualized portion of the abdominal parenchymal organs are unremarkable in appearance. Gallbladder is unremarkable. There is no evidence hydronephrosis. No abdominal mass or inflammatory process identified. No evidence of dilated abdominal bowel loops.  MRI PELVIS FINDINGS  An early twin intrauterine pregnancy is seen. An intramural fibroid is seen in the right lateral uterine wall measuring 3.6 x 3.0 cm by 4.5 cm.  Both ovaries are normal in appearance. No adnexal masses are identified. Normal appendix is visualized. No other inflammatory process or abnormal fluid collections identified  IMPRESSION: Early twin intrauterine pregnancy.  4.5 fibroid in the right lateral uterine wall.  No evidence of appendicitis, hydronephrosis, or other acute findings.   Electronically Signed   By: Earle Gell M.D.   On: 12/10/2013 12:34   Mr Abdomen Wo Contrast  12/10/2013   CLINICAL DATA:  Right lower quadrant pain for 3-4 days. Nausea. Vomiting. Nine weeks pregnant.  EXAM: MRI ABDOMEN AND PELVIS WITHOUT CONTRAST  TECHNIQUE: Multiplanar multisequence MR imaging of the abdomen and pelvis was performed. No intravenous contrast was administered.  COMPARISON:  CT on 10/11/2013  FINDINGS: MRI ABDOMEN FINDINGS  Visualized portion of the abdominal parenchymal organs are  unremarkable in appearance. Gallbladder is unremarkable. There is no evidence hydronephrosis. No abdominal mass or inflammatory process identified. No evidence of dilated abdominal bowel loops.  MRI PELVIS FINDINGS  An early twin intrauterine pregnancy is seen. An intramural fibroid is seen in the right lateral uterine wall measuring 3.6 x 3.0 cm by 4.5 cm.  Both ovaries are normal in appearance. No adnexal masses are identified. Normal appendix is visualized. No other inflammatory process  or abnormal fluid collections identified  IMPRESSION: Early twin intrauterine pregnancy.  4.5 fibroid in the right lateral uterine wall.  No evidence of appendicitis, hydronephrosis, or other acute findings.   Electronically Signed   By: Earle Gell M.D.   On: 12/10/2013 12:34   US Ob Comp Less 14 Wks  12/20/2013   CLINICAL DATA:  Vaginal bleeding. First trimester pregnancy. Twin gestation.  EXAM: TWIN OBSTETRICAL ULTRASOUND <14 WKS  COMPARISON:  12/10/2013  FINDINGS: TWIN 1  Intrauterine gestational sac: Visualized/normal in shape.  Yolk sac:  Not visualized  Embryo:  Visualized  Cardiac Activity: Visualized  Heart Rate: 167 bpm  MSD:   mm    w     d  CRL:   54.4  mm   12 w 1 d                  [place] EDC: 07/03/2014  TWIN 2  Intrauterine gestational sac: Visualized/normal in shape.  Yolk sac:  Not visualized  Embryo:  Visualized  Cardiac Activity: Visualized  Heart Rate: 161 bpm  MSD:   mm    w     d  CRL:   54.4  mm   12 w 1 d                  [place] EDC: 07/03/2014  Maternal uterus/adnexae: No subchorionic hemorrhage. 5.3 cm right lateral fibroid noted. Normal appearance of the ovaries. No free fluid.  IMPRESSION: Twin gestation with living embryos at estimated gestational age [redacted] weeks 1 day. No acute maternal findings.   Electronically Signed   By: Rolm Baptise M.D.   On: 12/20/2013 09:14   US Ob Comp Less 14 Wks  12/10/2013   CLINICAL DATA:  28 year old G1 P0, LMP 09/26/2013 (10 weeks 5 days), twin gestation, presenting with  right lower quadrant abdominal pain and right-sided pelvic pain associated with nausea and vomiting.  EXAM: TWIN OBSTETRIC <14 WK [place] AND TRANSVAGINAL OB US  COMPARISON:  Twin early OB ultrasound 11/28/2013. MRI abdomen pelvis performed same date.  FINDINGS: TWIN 1 (maternal right)  Intrauterine gestational sac: Single amniotic sac with thin intertwin membrane, normal in appearance.  Yolk sac:  Visualized.  Embryo:  Visualized.  Cardiac Activity: Visualized.  Heart Rate: 160 bpm  CRL:  30.0 mm   10 w 5  d                  [place] EDC: 07/13/2014  TWIN 2 (maternal left)  Intrauterine gestational sac: Single amniotic sac within intertwin membrane, normal in appearance.  Yolk sac:  Visualized.  Embryo:  Visualized.  Cardiac Activity: Visualized.  Heart Rate: 153 bpm  CRL:  37.6 mm   10 w 5 d                  [place] EDC: 07/13/2014  Maternal uterus/adnexae: Approximate 5.2 x 4.4 x 3.8 cm subserosal fibroid involving the right lateral uterine body. Focal myometrial contraction superior to the gestational sac. Normal-appearing ovaries bilaterally, the left measuring approximately 2.8 x 2.2 x 2.4 cm in the right measuring approximately 2.9 x 1.5 x 2.5 cm. No adnexal masses or free pelvic fluid. Cervix closed measuring approximately 5 cm transvaginally.  IMPRESSION: 1. Twin gestation with living embryos each with estimated gestational age of [redacted] weeks 5 days by crown-rump length, corresponding exactly with the assigned gestational age. 2. Focal contraction in the uterus superior to the gestational sac during the ultrasound examination. 3.  Approximate 5 cm subserosal fibroid involving the right lateral uterine body. 4. Normal-appearing ovaries bilaterally. No adnexal masses or free pelvic fluid. 5. Closed uterine cervix measuring approximately 5 cm in length transvaginally.   Electronically Signed   By: Evangeline Dakin M.D.   On: 12/10/2013 11:06   US Ob Comp Addl Gest Less 14 Wks  12/20/2013   CLINICAL DATA:  Vaginal bleeding.  First trimester pregnancy. Twin gestation.  EXAM: TWIN OBSTETRICAL ULTRASOUND <14 WKS  COMPARISON:  12/10/2013  FINDINGS: TWIN 1  Intrauterine gestational sac: Visualized/normal in shape.  Yolk sac:  Not visualized  Embryo:  Visualized  Cardiac Activity: Visualized  Heart Rate: 167 bpm  MSD:   mm    w     d  CRL:   54.4  mm   12 w 1 d                  [place] EDC: 07/03/2014  TWIN 2  Intrauterine gestational sac: Visualized/normal in shape.  Yolk sac:  Not visualized  Embryo:  Visualized  Cardiac Activity: Visualized  Heart Rate: 161 bpm  MSD:   mm    w     d  CRL:   54.4  mm   12 w 1 d                  [place] EDC: 07/03/2014  Maternal uterus/adnexae: No subchorionic hemorrhage. 5.3 cm right lateral fibroid noted. Normal appearance of the ovaries. No free fluid.  IMPRESSION: Twin gestation with living embryos at estimated gestational age [redacted] weeks 1 day. No acute maternal findings.   Electronically Signed   By: Rolm Baptise M.D.   On: 12/20/2013 09:14   US Ob Comp Addl Gest Less 14 Wks  12/10/2013   CLINICAL DATA:  28 year old G1 P0, LMP 09/26/2013 (10 weeks 5 days), twin gestation, presenting with right lower quadrant abdominal pain and right-sided pelvic pain associated with nausea and vomiting.  EXAM: TWIN OBSTETRIC <14 WK [place] AND TRANSVAGINAL OB US  COMPARISON:  Twin early OB ultrasound 11/28/2013. MRI abdomen pelvis performed same date.  FINDINGS: TWIN 1 (maternal right)  Intrauterine gestational sac: Single amniotic sac with thin intertwin membrane, normal in appearance.  Yolk sac:  Visualized.  Embryo:  Visualized.  Cardiac Activity: Visualized.  Heart Rate: 160 bpm  CRL:  30.0 mm   10 w 5  d                  [place] EDC: 07/13/2014  TWIN 2 (maternal left)  Intrauterine gestational sac: Single amniotic sac within intertwin membrane, normal in appearance.  Yolk sac:  Visualized.  Embryo:  Visualized.  Cardiac Activity: Visualized.  Heart Rate: 153 bpm  CRL:  37.6 mm   10 w 5 d                  [place] EDC: 07/13/2014  Maternal  uterus/adnexae: Approximate 5.2 x 4.4 x 3.8 cm subserosal fibroid involving the right lateral uterine body. Focal myometrial contraction superior to the gestational sac. Normal-appearing ovaries bilaterally, the left measuring approximately 2.8 x 2.2 x 2.4 cm in the right measuring approximately 2.9 x 1.5 x 2.5 cm. No adnexal masses or free pelvic fluid. Cervix closed measuring approximately 5 cm transvaginally.  IMPRESSION: 1. Twin gestation with living embryos each with estimated gestational age of [redacted] weeks 5 days by crown-rump length, corresponding exactly with the assigned gestational age. 2. Focal contraction in the uterus superior to  the gestational sac during the ultrasound examination. 3. Approximate 5 cm subserosal fibroid involving the right lateral uterine body. 4. Normal-appearing ovaries bilaterally. No adnexal masses or free pelvic fluid. 5. Closed uterine cervix measuring approximately 5 cm in length transvaginally.   Electronically Signed   By: Evangeline Dakin M.D.   On: 12/10/2013 11:06   US Ob Transvaginal  12/10/2013   CLINICAL DATA:  28 year old G1 P0, LMP 09/26/2013 (10 weeks 5 days), twin gestation, presenting with right lower quadrant abdominal pain and right-sided pelvic pain associated with nausea and vomiting.  EXAM: TWIN OBSTETRIC <14 WK [place] AND TRANSVAGINAL OB US  COMPARISON:  Twin early OB ultrasound 11/28/2013. MRI abdomen pelvis performed same date.  FINDINGS: TWIN 1 (maternal right)  Intrauterine gestational sac: Single amniotic sac with thin intertwin membrane, normal in appearance.  Yolk sac:  Visualized.  Embryo:  Visualized.  Cardiac Activity: Visualized.  Heart Rate: 160 bpm  CRL:  30.0 mm   10 w 5  d                  [place] EDC: 07/13/2014  TWIN 2 (maternal left)  Intrauterine gestational sac: Single amniotic sac within intertwin membrane, normal in appearance.  Yolk sac:  Visualized.  Embryo:  Visualized.  Cardiac Activity: Visualized.  Heart Rate: 153 bpm  CRL:  37.6 mm   10 w  5 d                  [place] EDC: 07/13/2014  Maternal uterus/adnexae: Approximate 5.2 x 4.4 x 3.8 cm subserosal fibroid involving the right lateral uterine body. Focal myometrial contraction superior to the gestational sac. Normal-appearing ovaries bilaterally, the left measuring approximately 2.8 x 2.2 x 2.4 cm in the right measuring approximately 2.9 x 1.5 x 2.5 cm. No adnexal masses or free pelvic fluid. Cervix closed measuring approximately 5 cm transvaginally.  IMPRESSION: 1. Twin gestation with living embryos each with estimated gestational age of 104 weeks 5 days by crown-rump length, corresponding exactly with the assigned gestational age. 2. Focal contraction in the uterus superior to the gestational sac during the ultrasound examination. 3. Approximate 5 cm subserosal fibroid involving the right lateral uterine body. 4. Normal-appearing ovaries bilaterally. No adnexal masses or free pelvic fluid. 5. Closed uterine cervix measuring approximately 5 cm in length transvaginally.   Electronically Signed   By: Evangeline Dakin M.D.   On: 12/10/2013 11:06   MAU COURSE Offered Tramadol in MAU. Prefers to pick up at pharmacy and go home.   ASSESSMENT 1. UTI in pregnancy, antepartum, second trimester   2. Vaginal yeast infection   3. Abdominal pain affecting pregnancy, antepartum-likely R/T fibroid    PLAN Discharge home in stable condition.  Comfort measures Urine culture pending Follow-up Information   Follow up with [facility] On 01/23/2014. (As scheduled or As needed if symptoms worsen)    Specialty:  Obstetrics and Gynecology   Contact information:   [place] [place] [address] [phone]      Follow up with [facility]. (As needed in emergencies)    Contact information:   [address] [id number] [place] [address] [phone]       Medication List         acetaminophen 325 MG tablet  Commonly  known as:  TYLENOL  Take 325-650 mg by mouth every 6 (six) hours as needed for mild pain or headache.     nitrofurantoin (  macrocrystal-monohydrate) 100 MG capsule  Commonly known as:  MACROBID  Take 1 capsule (100 mg total) by mouth 2 (two) times daily.     ondansetron 4 MG tablet  Commonly known as:  ZOFRAN  Take 1 tablet (4 mg total) by mouth every 8 (eight) hours as needed for nausea or vomiting.     prenatal multivitamin Tabs tablet  Take 1 tablet by mouth at bedtime.     promethazine 25 MG tablet  Commonly known as:  PHENERGAN  Take 1 tablet (25 mg total) by mouth every 6 (six) hours as needed for nausea or vomiting.     terconazole 0.4 % vaginal cream  Commonly known as:  TERAZOL 7  Place 1 applicator vaginally at bedtime. x 7 nights     traMADol 50 MG tablet  Commonly known as:  ULTRAM  Take 1-2 tablets (50-100 mg total) by mouth every 6 (six) hours as needed for severe pain.       [place], CNM 12/30/2013  3:24 AM

## 2013-12-30 NOTE — Discharge Instructions (Signed)
Candidal Vulvovaginitis Candidal vulvovaginitis is an infection of the vagina and vulva. The vulva is the skin around the opening of the vagina. This may cause itching and discomfort in and around the vagina.  HOME CARE  Only take medicine as told by your doctor.  Do not have sex (intercourse) until the infection is healed or as told by your doctor.  Practice safe sex.  Tell your sex partner about your infection.  Do not douche or use tampons.  Wear cotton underwear. Do not wear tight pants or panty hose.  Eat yogurt. This may help treat and prevent yeast infections. GET HELP RIGHT AWAY IF:   You have a fever.  Your problems get worse during treatment or do not get better in 3 days.  You have discomfort, irritation, or itching in your vagina or vulva area.  You have pain after sex.  You start to get belly (abdominal) pain. MAKE SURE YOU:  Understand these instructions.  Will watch your condition.  Will get help right away if you are not doing well or get worse. Document Released: 05/15/2008 Document Revised: 02/21/2013 Document Reviewed: 05/15/2008 [facility] Patient Information 2015 [place], [place]. This information is not intended to replace advice given to you by your health care provider. Make sure you discuss any questions you have with your health care provider.  Pregnancy and Urinary Tract Infection A urinary tract infection (UTI) is a bacterial infection of the urinary tract. Infection of the urinary tract can include the ureters, kidneys (pyelonephritis), bladder (cystitis), and urethra (urethritis). All pregnant women should be screened for bacteria in the urinary tract. Identifying and treating a UTI will decrease the risk of preterm labor and developing more serious infections in both the mother and baby. CAUSES Bacteria germs cause almost all UTIs.  RISK FACTORS Many factors can increase your chances of getting a UTI during pregnancy. These include:  Having a  short urethra.  Poor toilet and hygiene habits.  Sexual intercourse.  Blockage of urine along the urinary tract.  Problems with the pelvic muscles or nerves.  Diabetes.  Obesity.  Bladder problems after having several children.  Previous history of UTI. SIGNS AND SYMPTOMS   Pain, burning, or a stinging feeling when urinating.  Suddenly feeling the need to urinate right away (urgency).  Loss of bladder control (urinary incontinence).  Frequent urination, more than is common with pregnancy.  Lower abdominal or back discomfort.  Cloudy urine.  Blood in the urine (hematuria).  Fever. When the kidneys are infected, the symptoms may be:  Back pain.  Flank pain on the right side more so than the left.  Fever.  Chills.  Nausea.  Vomiting. DIAGNOSIS  A urinary tract infection is usually diagnosed through urine tests. Additional tests and procedures are sometimes done. These may include:  Ultrasound exam of the kidneys, ureters, bladder, and urethra.  Looking in the bladder with a lighted tube (cystoscopy). TREATMENT Typically, UTIs can be treated with antibiotic medicines.  HOME CARE INSTRUCTIONS   Only take over-the-counter or prescription medicines as directed by your health care provider. If you were prescribed antibiotics, take them as directed. Finish them even if you start to feel better.  Drink enough fluids to keep your urine clear or pale yellow.  Do not have sexual intercourse until the infection is gone and your health care provider says it is okay.  Make sure you are tested for UTIs throughout your pregnancy. These infections often come back. Preventing a UTI in the Future  Practice good toilet habits. Always wipe from front to back. Use the tissue only once.  Do not hold your urine. Empty your bladder as soon as possible when the urge comes.  Do not douche or use deodorant sprays.  Wash with soap and warm water around the genital area and  the anus.  Empty your bladder before and after sexual intercourse.  Wear underwear with a cotton crotch.  Avoid caffeine and carbonated drinks. They can irritate the bladder.  Drink cranberry juice or take cranberry pills. This may decrease the risk of getting a UTI.  Do not drink alcohol.  Keep all your appointments and tests as scheduled. SEEK MEDICAL CARE IF:   Your symptoms get worse.  You are still having fevers 2 or more days after treatment begins.  You have a rash.  You feel that you are having problems with medicines prescribed.  You have abnormal vaginal discharge. SEEK IMMEDIATE MEDICAL CARE IF:   You have back or flank pain.  You have chills.  You have blood in your urine.  You have nausea and vomiting.  You have contractions of your uterus.  You have a gush of fluid from the vagina. MAKE SURE YOU:  Understand these instructions.   Will watch your condition.   Will get help right away if you are not doing well or get worse.  Document Released: 06/13/2010 Document Revised: 12/07/2012 Document Reviewed: 09/15/2012 [facility] Patient Information 2015 [place], [place]. This information is not intended to replace advice given to you by your health care provider. Make sure you discuss any questions you have with your health care provider.

## 2014-01-01 ENCOUNTER — Encounter: Payer: Self-pay | Admitting: *Deleted

## 2014-01-01 ENCOUNTER — Other Ambulatory Visit: Payer: Self-pay | Admitting: Obstetrics & Gynecology

## 2014-01-01 ENCOUNTER — Encounter (HOSPITAL_COMMUNITY): Payer: Self-pay | Admitting: *Deleted

## 2014-01-01 LAB — CERVICOVAGINAL ANCILLARY ONLY
Wet Prep (BD Affirm): NEGATIVE
Wet Prep (BD Affirm): NEGATIVE
Wet Prep (BD Affirm): POSITIVE — AB

## 2014-01-01 MED ORDER — FLUCONAZOLE 150 MG PO TABS: 150.0000 mg | ORAL_TABLET | Freq: Once | ORAL | Status: DC

## 2014-01-01 NOTE — Progress Notes (Signed)
Attempted to contact patient. No answer. Left message stating we are calling with results and information regarding RX that has been sent to your pharmacy, please call clinic.

## 2014-01-01 NOTE — Progress Notes (Signed)
Mychart message sent to patient with results.

## 2014-01-02 LAB — CULTURE, OB URINE
Colony Count: 75000
Special Requests: NORMAL

## 2014-01-05 ENCOUNTER — Other Ambulatory Visit (HOSPITAL_COMMUNITY): Payer: Self-pay

## 2014-01-06 ENCOUNTER — Inpatient Hospital Stay (HOSPITAL_COMMUNITY)
Admission: AD | Admit: 2014-01-06 | Discharge: 2014-01-06 | Disposition: A | Discharge status: Home / Self Care | Payer: Medicaid Other | Source: Ambulatory Visit | Attending: Obstetrics and Gynecology | Admitting: Obstetrics and Gynecology

## 2014-01-06 ENCOUNTER — Inpatient Hospital Stay (HOSPITAL_COMMUNITY): Payer: Medicaid Other

## 2014-01-06 ENCOUNTER — Encounter (HOSPITAL_COMMUNITY): Payer: Self-pay | Admitting: *Deleted

## 2014-01-06 DIAGNOSIS — O30032 Twin pregnancy, monochorionic/diamniotic, second trimester: Secondary | ICD-10-CM | POA: Diagnosis present

## 2014-01-06 DIAGNOSIS — O4692 Antepartum hemorrhage, unspecified, second trimester: Secondary | ICD-10-CM

## 2014-01-06 DIAGNOSIS — O209 Hemorrhage in early pregnancy, unspecified: Secondary | ICD-10-CM | POA: Insufficient documentation

## 2014-01-06 DIAGNOSIS — Z3A14 14 weeks gestation of pregnancy: Secondary | ICD-10-CM | POA: Insufficient documentation

## 2014-01-06 DIAGNOSIS — O4690 Antepartum hemorrhage, unspecified, unspecified trimester: Secondary | ICD-10-CM | POA: Insufficient documentation

## 2014-01-06 DIAGNOSIS — O30039 Twin pregnancy, monochorionic/diamniotic, unspecified trimester: Secondary | ICD-10-CM

## 2014-01-06 LAB — URINE MICROSCOPIC-ADD ON

## 2014-01-06 LAB — CBC
HCT: 32.6 % — ABNORMAL LOW (ref 36.0–46.0)
Hemoglobin: 11.2 g/dL — ABNORMAL LOW (ref 12.0–15.0)
MCH: 30 pg (ref 26.0–34.0)
MCHC: 34.4 g/dL (ref 30.0–36.0)
MCV: 87.4 fL (ref 78.0–100.0)
Platelets: 225 10*3/uL (ref 150–400)
RBC: 3.73 MIL/uL — ABNORMAL LOW (ref 3.87–5.11)
RDW: 14.6 % (ref 11.5–15.5)
WBC: 9 10*3/uL (ref 4.0–10.5)

## 2014-01-06 LAB — URINALYSIS, ROUTINE W REFLEX MICROSCOPIC
Bilirubin Urine: NEGATIVE
Glucose, UA: NEGATIVE mg/dL
Ketones, ur: 40 mg/dL — AB
Leukocytes, UA: NEGATIVE
Nitrite: NEGATIVE
Protein, ur: NEGATIVE mg/dL
Specific Gravity, Urine: >1.03 — ABNORMAL HIGH (ref 1.005–1.030)
Urobilinogen, UA: 0.2 mg/dL (ref 0.0–1.0)
pH: 6 (ref 5.0–8.0)

## 2014-01-06 LAB — WET PREP, GENITAL
Clue Cells Wet Prep HPF POC: NONE SEEN
Trich, Wet Prep: NONE SEEN
Yeast Wet Prep HPF POC: NONE SEEN

## 2014-01-06 MED ORDER — CEPHALEXIN 500 MG PO CAPS: 500.0000 mg | ORAL_CAPSULE | Freq: Four times a day (QID) | ORAL | Status: AC

## 2014-01-06 NOTE — Discharge Instructions (Signed)
Vaginal Bleeding During Pregnancy, Second Trimester °A small amount of bleeding (spotting) from the vagina is relatively common in pregnancy. It usually stops on its own. Various things can cause bleeding or spotting in pregnancy. Some bleeding may be related to the pregnancy, and some may not. Sometimes the bleeding is normal and is not a problem. However, bleeding can also be a sign of something serious. Be sure to tell your health care provider about any vaginal bleeding right away. °Some possible causes of vaginal bleeding during the second trimester include: °· Infection, inflammation, or growths on the cervix.   °· The placenta may be partially or completely covering the opening of the cervix inside the uterus (placenta previa). °· The placenta may have separated from the uterus (abruption of the placenta).   °· You may be having early (preterm) labor.   °· The cervix may not be strong enough to keep a baby inside the uterus (cervical insufficiency).   °· Tiny cysts may have developed in the uterus instead of pregnancy tissue (molar pregnancy).  °HOME CARE INSTRUCTIONS  °Watch your condition for any changes. The following actions may help to lessen any discomfort you are feeling: °· Follow your health care provider's instructions for limiting your activity. If your health care provider orders bed rest, you may need to stay in bed and only get up to use the bathroom. However, your health care provider may allow you to continue light activity. °· If needed, make plans for someone to help with your regular activities and responsibilities while you are on bed rest. °· Keep track of the number of pads you use each day, how often you change pads, and how soaked (saturated) they are. Write this down. °· Do not use tampons. Do not douche. °· Do not have sexual intercourse or orgasms until approved by your health care provider. °· If you pass any tissue from your vagina, save the tissue so you can show it to your  health care provider. °· Only take over-the-counter or prescription medicines as directed by your health care provider. °· Do not take aspirin because it can make you bleed. °· Do not exercise or perform any strenuous activities or heavy lifting without your health care provider's permission. °· Keep all follow-up appointments as directed by your health care provider. °SEEK MEDICAL CARE IF: °· You have any vaginal bleeding during any part of your pregnancy. °· You have cramps or labor pains. °· You have a fever, not controlled by medicine. °SEEK IMMEDIATE MEDICAL CARE IF:  °· You have severe cramps in your back or belly (abdomen). °· You have contractions. °· You have chills. °· You pass large clots or tissue from your vagina. °· Your bleeding increases. °· You feel light-headed or weak, or you have fainting episodes. °· You are leaking fluid or have a gush of fluid from your vagina. °MAKE SURE YOU: °· Understand these instructions. °· Will watch your condition. °· Will get help right away if you are not doing well or get worse. °Document Released: 11/26/2004 Document Revised: 02/21/2013 Document Reviewed: 10/24/2012 °[employer]® Patient Information ©2015 [employer]. This information is not intended to replace advice given to you by your health care provider. Make sure you discuss any questions you have with your health care provider. ° °

## 2014-01-06 NOTE — MAU Note (Signed)
Pt states here for vaginal bleeding that began around 2100 last pm. Changed pad 10 times yesterday and through the early am, and has changed pad x5 today. States pads were saturated. Having mid to upper abdominal pain.

## 2014-01-06 NOTE — MAU Provider Note (Signed)
Chief Complaint: Vaginal Bleeding   First Provider Initiated Contact with Patient 01/06/14 1300     SUBJECTIVE HPI: Sherri [name] is a 28 y.o. G1P0 at [redacted]w[redacted]d by LMP who presents to maternity admissions reporting bright red vaginal bleeding at home last night around 9 pm, then pink/brown spotting since then.  She reports she changed her pad 9 times and soaked a large pad, not a pantyliner, each of these times.  She was recently diagnosed with vaginal yeast infection but was unable to pick up her medication related to cost.  She denies vaginal itching/burning, urinary symptoms, h/a, dizziness, n/v, or fever/chills.    Pt blood type A neg.  Rhophylac given 12/17/13.    Past Medical History  Diagnosis Date  . Uterine fibroid    Past Surgical History  Procedure Laterality Date  . No past surgeries     History   Social History  . Marital Status: Single    Spouse Name: N/A    Number of Children: N/A  . Years of Education: N/A   Occupational History  . Not on file.   Social History Main Topics  . Smoking status: Never Smoker   . Smokeless tobacco: Never Used  . Alcohol Use: No     Comment: occasional  . Drug Use: No  . Sexual Activity: Yes    Birth Control/ Protection: None   Other Topics Concern  . Not on file   Social History Narrative   No current facility-administered medications on file prior to encounter.   Current Outpatient Prescriptions on File Prior to Encounter  Medication Sig Dispense Refill  . Prenatal Vit-Fe Fumarate-FA (PRENATAL MULTIVITAMIN) TABS tablet Take 1 tablet by mouth at bedtime.     . promethazine (PHENERGAN) 25 MG tablet Take 1 tablet (25 mg total) by mouth every 6 (six) hours as needed for nausea or vomiting. 30 tablet 0  . traMADol (ULTRAM) 50 MG tablet Take 1-2 tablets (50-100 mg total) by mouth every 6 (six) hours as needed for severe pain. 30 tablet 0  . acetaminophen (TYLENOL) 325 MG tablet Take 325-650 mg by mouth every 6 (six) hours as  needed for mild pain or headache.    . nitrofurantoin, macrocrystal-monohydrate, (MACROBID) 100 MG capsule Take 1 capsule (100 mg total) by mouth 2 (two) times daily. (Patient not taking: Reported on 01/06/2014) 14 capsule 0  . ondansetron (ZOFRAN) 4 MG tablet Take 1 tablet (4 mg total) by mouth every 8 (eight) hours as needed for nausea or vomiting. 20 tablet 0  . terconazole (TERAZOL 7) 0.4 % vaginal cream Place 1 applicator vaginally at bedtime. x 7 nights (Patient not taking: Reported on 01/06/2014) 45 g 0   No Known Allergies  ROS: Pertinent items in HPI  OBJECTIVE Blood pressure 137/61, pulse 100, temperature 98.4 F (36.9 C), temperature source Oral, resp. rate 16, height 4\' 11"  (1.499 m), weight 161 lb 8 oz (73.256 kg), last menstrual period 10/03/2013. GENERAL: Well-developed, well-nourished female in no acute distress.  HEENT: Normocephalic HEART: normal rate RESP: normal effort ABDOMEN: Soft, non-tender EXTREMITIES: Nontender, no edema NEURO: Alert and oriented Pelvic exam: Cervix pink, visually closed, without lesion, scant clear discharge, no blood visualized but cotton swab with brown discharge noted, vaginal walls and external genitalia normal Bimanual exam: Cervix 0/long/high, firm, anterior  LAB RESULTS Results for orders placed or performed during the hospital encounter of 01/06/14 (from the past 24 hour(s))  Urinalysis, Routine w reflex microscopic     Status: Abnormal  Collection Time: 01/06/14 12:04 PM  Result Value Ref Range   Color, Urine YELLOW YELLOW   APPearance CLEAR CLEAR   Specific Gravity, Urine >1.030 (H) 1.005 - 1.030   pH 6.0 5.0 - 8.0   Glucose, UA NEGATIVE NEGATIVE mg/dL   Hgb urine dipstick MODERATE (A) NEGATIVE   Bilirubin Urine NEGATIVE NEGATIVE   Ketones, ur 40 (A) NEGATIVE mg/dL   Protein, ur NEGATIVE NEGATIVE mg/dL   Urobilinogen, UA 0.2 0.0 - 1.0 mg/dL   Nitrite NEGATIVE NEGATIVE   Leukocytes, UA NEGATIVE NEGATIVE  Urine microscopic-add  on     Status: Abnormal   Collection Time: 01/06/14 12:04 PM  Result Value Ref Range   Squamous Epithelial / LPF FEW (A) RARE   WBC, UA 0-2 <3 WBC/hpf   RBC / HPF 0-2 <3 RBC/hpf   Bacteria, UA FEW (A) RARE   Urine-Other MUCOUS PRESENT   Wet prep, genital     Status: Abnormal   Collection Time: 01/06/14  1:05 PM  Result Value Ref Range   Yeast Wet Prep HPF POC NONE SEEN NONE SEEN   Trich, Wet Prep NONE SEEN NONE SEEN   Clue Cells Wet Prep HPF POC NONE SEEN NONE SEEN   WBC, Wet Prep HPF POC FEW (A) NONE SEEN  CBC     Status: Abnormal   Collection Time: 01/06/14  1:19 PM  Result Value Ref Range   WBC 9.0 4.0 - 10.5 K/uL   RBC 3.73 (L) 3.87 - 5.11 MIL/uL   Hemoglobin 11.2 (L) 12.0 - 15.0 g/dL   HCT 32.6 (L) 36.0 - 46.0 %   MCV 87.4 78.0 - 100.0 fL   MCH 30.0 26.0 - 34.0 pg   MCHC 34.4 30.0 - 36.0 g/dL   RDW 14.6 11.5 - 15.5 %   Platelets 225 150 - 400 K/uL    IMAGING    ASSESSMENT 1. Vaginal bleeding in pregnancy, second trimester   2. Monochorionic diamniotic twin pregnancy, antepartum     PLAN Consult Dr Elly Modena.  Discharge home with bleeding precautions F/U in clinic as scheduled Return to MAU as needed for emergencies    Medication List    ASK your doctor about these medications        acetaminophen 325 MG tablet  Commonly known as:  TYLENOL  Take 325-650 mg by mouth every 6 (six) hours as needed for mild pain or headache.     fluconazole 150 MG tablet  Commonly known as:  DIFLUCAN  Take 1 tablet (150 mg total) by mouth once.     nitrofurantoin (macrocrystal-monohydrate) 100 MG capsule  Commonly known as:  MACROBID  Take 1 capsule (100 mg total) by mouth 2 (two) times daily.     ondansetron 4 MG tablet  Commonly known as:  ZOFRAN  Take 1 tablet (4 mg total) by mouth every 8 (eight) hours as needed for nausea or vomiting.     prenatal multivitamin Tabs tablet  Take 1 tablet by mouth at bedtime.     promethazine 25 MG tablet  Commonly known as:   PHENERGAN  Take 1 tablet (25 mg total) by mouth every 6 (six) hours as needed for nausea or vomiting.     terconazole 0.4 % vaginal cream  Commonly known as:  TERAZOL 7  Place 1 applicator vaginally at bedtime. x 7 nights     traMADol 50 MG tablet  Commonly known as:  ULTRAM  Take 1-2 tablets (50-100 mg total) by mouth every 6 (six)  hours as needed for severe pain.         Fatima Blank Certified Nurse-Midwife 01/06/2014  1:48 PM

## 2014-01-08 LAB — HIV ANTIBODY (ROUTINE TESTING W REFLEX): HIV 1&2 Ab, 4th Generation: NONREACTIVE

## 2014-01-09 ENCOUNTER — Encounter (HOSPITAL_COMMUNITY): Payer: Self-pay | Admitting: *Deleted

## 2014-01-09 ENCOUNTER — Inpatient Hospital Stay (HOSPITAL_COMMUNITY)
Admission: AD | Admit: 2014-01-09 | Discharge: 2014-01-09 | Disposition: A | Discharge status: Home / Self Care | Payer: Medicaid Other | Source: Ambulatory Visit | Attending: Obstetrics & Gynecology | Admitting: Obstetrics & Gynecology

## 2014-01-09 DIAGNOSIS — O9989 Other specified diseases and conditions complicating pregnancy, childbirth and the puerperium: Secondary | ICD-10-CM | POA: Diagnosis not present

## 2014-01-09 DIAGNOSIS — R140 Abdominal distension (gaseous): Secondary | ICD-10-CM | POA: Insufficient documentation

## 2014-01-09 DIAGNOSIS — Z3A14 14 weeks gestation of pregnancy: Secondary | ICD-10-CM | POA: Diagnosis not present

## 2014-01-09 DIAGNOSIS — O30002 Twin pregnancy, unspecified number of placenta and unspecified number of amniotic sacs, second trimester: Secondary | ICD-10-CM | POA: Diagnosis not present

## 2014-01-09 DIAGNOSIS — O26899 Other specified pregnancy related conditions, unspecified trimester: Secondary | ICD-10-CM

## 2014-01-09 DIAGNOSIS — R109 Unspecified abdominal pain: Secondary | ICD-10-CM | POA: Diagnosis present

## 2014-01-09 LAB — URINALYSIS, ROUTINE W REFLEX MICROSCOPIC
Bilirubin Urine: NEGATIVE
Glucose, UA: NEGATIVE mg/dL
Ketones, ur: NEGATIVE mg/dL
Leukocytes, UA: NEGATIVE
Nitrite: NEGATIVE
Protein, ur: NEGATIVE mg/dL
Specific Gravity, Urine: >1.03 — ABNORMAL HIGH (ref 1.005–1.030)
Urobilinogen, UA: 0.2 mg/dL (ref 0.0–1.0)
pH: 6 (ref 5.0–8.0)

## 2014-01-09 LAB — URINE MICROSCOPIC-ADD ON

## 2014-01-09 MED ORDER — PROMETHAZINE HCL 25 MG PO TABS: 25.0000 mg | ORAL_TABLET | Freq: Four times a day (QID) | ORAL | Status: DC | PRN

## 2014-01-09 MED ORDER — TRAMADOL HCL 50 MG PO TABS
100.0000 mg | ORAL_TABLET | Freq: Once | ORAL | Status: AC
  Administered 2014-01-09: 100 mg via ORAL
  Filled 2014-01-09: qty 2

## 2014-01-09 MED ORDER — POLYETHYLENE GLYCOL 3350 17 G PO PACK: 17.0000 g | PACK | Freq: Every day | ORAL | Status: DC

## 2014-01-09 MED ORDER — TRAMADOL HCL 50 MG PO TABS: 50.0000 mg | ORAL_TABLET | Freq: Four times a day (QID) | ORAL | Status: DC | PRN

## 2014-01-09 NOTE — Discharge Instructions (Signed)
Bloating Bloating is the feeling of fullness in your belly. You may feel as though your pants are too tight. Often the cause of bloating is overeating, retaining fluids, or having gas in your bowel. It is also caused by swallowing air and eating foods that cause gas. Irritable bowel syndrome is one of the most common causes of bloating. Constipation is also a common cause. Sometimes more serious problems can cause bloating. SYMPTOMS  Usually there is a feeling of fullness, as though your abdomen is bulged out. There may be mild discomfort.  DIAGNOSIS  Usually no particular testing is necessary for most bloating. If the condition persists and seems to become worse, your caregiver may do additional testing.  TREATMENT   There is no direct treatment for bloating.  Do not put gas into the bowel. Avoid chewing gum and sucking on candy. These tend to make you swallow air. Swallowing air can also be a nervous habit. Try to avoid this.  Avoiding high residue diets will help. Eat foods with soluble fibers (examples include root vegetables, apples, or barley) and substitute dairy products with soy and rice products. This helps irritable bowel syndrome.  If constipation is the cause, then a high residue diet with more fiber will help.  Avoid carbonated beverages.  Over-the-counter preparations are available that help reduce gas. Your pharmacist can help you with this. SEEK MEDICAL CARE IF:   Bloating continues and seems to be getting worse.  You notice a weight gain.  You have a weight loss but the bloating is getting worse.  You have changes in your bowel habits or develop nausea or vomiting. SEEK IMMEDIATE MEDICAL CARE IF:   You develop shortness of breath or swelling in your legs.  You have an increase in abdominal pain or develop chest pain. Document Released: 12/17/2005 Document Revised: 05/11/2011 Document Reviewed: 02/04/2007 [facility] Patient Information 2015 [place], [place]. This  information is not intended to replace advice given to you by your health care provider. Make sure you discuss any questions you have with your health care provider.   Abdominal Pain During Pregnancy Abdominal pain is common in pregnancy. Most of the time, it does not cause harm. There are many causes of abdominal pain. Some causes are more serious than others. Some of the causes of abdominal pain in pregnancy are easily diagnosed. Occasionally, the diagnosis takes time to understand. Other times, the cause is not determined. Abdominal pain can be a sign that something is very wrong with the pregnancy, or the pain may have nothing to do with the pregnancy at all. For this reason, always tell your health care provider if you have any abdominal discomfort. HOME CARE INSTRUCTIONS  Monitor your abdominal pain for any changes. The following actions may help to alleviate any discomfort you are experiencing:  Do not have sexual intercourse or put anything in your vagina until your symptoms go away completely.  Get plenty of rest until your pain improves.  Drink clear fluids if you feel nauseous. Avoid solid food as long as you are uncomfortable or nauseous.  Only take over-the-counter or prescription medicine as directed by your health care provider.  Keep all follow-up appointments with your health care provider. SEEK IMMEDIATE MEDICAL CARE IF:  You are bleeding, leaking fluid, or passing tissue from the vagina.  You have increasing pain or cramping.  You have persistent vomiting.  You have painful or bloody urination.  You have a fever.  You notice a decrease in your baby's movements.  You have extreme weakness or feel faint.  You have shortness of breath, with or without abdominal pain.  You develop a severe headache with abdominal pain.  You have abnormal vaginal discharge with abdominal pain.  You have persistent diarrhea.  You have abdominal pain that continues even after  rest, or gets worse. MAKE SURE YOU:   Understand these instructions.  Will watch your condition.  Will get help right away if you are not doing well or get worse. Document Released: 02/16/2005 Document Revised: 12/07/2012 Document Reviewed: 09/15/2012 [facility] Patient Information 2015 [place], [place]. This information is not intended to replace advice given to you by your health care provider. Make sure you discuss any questions you have with your health care provider.

## 2014-01-09 NOTE — MAU Note (Signed)
Patient states she has had abdominal pain since yesterday, no bleeding or discharge. Nausea with occasional vomiting. Patient has twins.

## 2014-01-09 NOTE — MAU Provider Note (Signed)
Chief Complaint: Abdominal Pain  Chief Complaint: Abdominal Pain   First Provider Initiated Contact with Patient 01/09/14 1445     SUBJECTIVE HPI: Sherri [name] is a 28 y.o. G1P0 at [redacted]w[redacted]d by LMP who presents with left mid abd pain since yesterday that she describes as sharp, intermittent, 9/10 on pain scale when it occurs. Also reports ongoing mild N/V of pregnancy (once per day). Denies bleeding or discharge. Twins  Has had 5 Ultrasounds, 1 MRI and 1 Abd CT this pregnancy. All essentially normal except for fibroid uterus.   Past Medical History  Diagnosis Date  . Uterine fibroid    OB History  Gravida Para Term Preterm AB SAB TAB Ectopic Multiple Living  1             # Outcome Date GA Lbr Len/2nd Weight Sex Delivery Anes PTL Lv  1 Current              Past Surgical History  Procedure Laterality Date  . No past surgeries     History   Social History  . Marital Status: Single    Spouse Name: N/A    Number of Children: N/A  . Years of Education: N/A   Occupational History  . Not on file.   Social History Main Topics  . Smoking status: Never Smoker   . Smokeless tobacco: Never Used  . Alcohol Use: No     Comment: occasional  . Drug Use: No  . Sexual Activity: Yes    Birth Control/ Protection: None   Other Topics Concern  . Not on file   Social History Narrative   No current facility-administered medications on file prior to encounter.   Current Outpatient Prescriptions on File Prior to Encounter  Medication Sig Dispense Refill  . acetaminophen (TYLENOL) 325 MG tablet Take 325-650 mg by mouth every 6 (six) hours as needed for mild pain or headache.    . cephALEXin (KEFLEX) 500 MG capsule Take 1 capsule (500 mg total) by mouth 4 (four) times daily. 28 capsule 0  . Prenatal Vit-Fe Fumarate-FA (PRENATAL MULTIVITAMIN) TABS tablet Take 1 tablet by mouth at bedtime.     . ondansetron (ZOFRAN) 4 MG tablet Take 1 tablet (4 mg total) by mouth every 8 (eight) hours as  needed for nausea or vomiting. (Patient not taking: Reported on 01/09/2014) 20 tablet 0   No Known Allergies  ROS: Pertinent items in HPI. Neg for fever, chills, urinary complaints, diarrhea, constipation, epigastric pain, pain after high-fat foods.   OBJECTIVE Blood pressure 130/70, pulse 80, temperature 98.4 F (36.9 C), resp. rate 16, weight 73.755 kg (162 lb 9.6 oz), last menstrual period 10/03/2013, SpO2 100 %. GENERAL: Well-developed, well-nourished female in no acute distress. Smiling.  HEENT: Normocephalic HEART: normal rate RESP: normal effort ABDOMEN: Moderately distended, mild TTP left mid and upper abd and right mid abd. Uterus gravid, S>D, NT. Neg Murphy's sign. No CVAT. Pos BS x 4. EXTREMITIES: Nontender, no edema NEURO: Alert and oriented SPECULUM EXAM: NEFG, physiologic discharge, no blood noted, cervix clean BIMANUAL: cervix long/closed; uterus 20-week size, no adnexal tenderness or masses FHR 140/148 per doppler.   LAB RESULTS Results for orders placed or performed during the hospital encounter of 01/09/14 (from the past 24 hour(s))  Urinalysis, Routine w reflex microscopic     Status: Abnormal   Collection Time: 01/09/14 12:50 PM  Result Value Ref Range   Color, Urine YELLOW YELLOW   APPearance CLEAR CLEAR   Specific Gravity,  Urine >1.030 (H) 1.005 - 1.030   pH 6.0 5.0 - 8.0   Glucose, UA NEGATIVE NEGATIVE mg/dL   Hgb urine dipstick TRACE (A) NEGATIVE   Bilirubin Urine NEGATIVE NEGATIVE   Ketones, ur NEGATIVE NEGATIVE mg/dL   Protein, ur NEGATIVE NEGATIVE mg/dL   Urobilinogen, UA 0.2 0.0 - 1.0 mg/dL   Nitrite NEGATIVE NEGATIVE   Leukocytes, UA NEGATIVE NEGATIVE  Urine microscopic-add on     Status: Abnormal   Collection Time: 01/09/14 12:50 PM  Result Value Ref Range   Squamous Epithelial / LPF MANY (A) RARE   WBC, UA 0-2 <3 WBC/hpf   RBC / HPF 0-2 <3 RBC/hpf   Bacteria, UA FEW (A) RARE   Urine-Other MUCOUS PRESENT     IMAGING  MAU COURSE Pain much  abetter after Ultram.   ASSESSMENT 1. Abdominal pain affecting pregnancy, antepartum   2. Abdominal bloating   3. Twin pregnancy in second trimester     PLAN Discharge home in stable condition per consult w/ Dr. Roselie Awkward. Lengthy discussion w/ pt RE: normal discomforts of pregnancy vs danger signs. Encouraged comfort measures before medications when possible.  Follow-up Information    Follow up with [facility] On 01/23/2014.   Specialty:  Obstetrics and Gynecology   Why:  As scheduled   Contact information:   [place] [phone]      Follow up with [facility].   Why:  As needed in emergencies   Contact information:   [address] [id number] [place] [phone]       Medication List    STOP taking these medications        ondansetron 4 MG tablet  Commonly known as:  ZOFRAN      TAKE these medications        acetaminophen 325 MG tablet  Commonly known as:  TYLENOL  Take 325-650 mg by mouth every 6 (six) hours as needed for mild pain or headache.     cephALEXin 500 MG capsule  Commonly known as:  KEFLEX  Take 1 capsule (500 mg total) by mouth 4 (four) times daily.     polyethylene glycol packet  Commonly known as:  MIRALAX / GLYCOLAX  Take 17 g by mouth daily. X 4 days, then daily as needed.     prenatal multivitamin Tabs tablet  Take 1 tablet by mouth at bedtime.     promethazine 25 MG tablet  Commonly known as:  PHENERGAN  Take 1 tablet (25 mg total) by mouth every 6 (six) hours as needed for nausea or vomiting.     traMADol 50 MG tablet  Commonly known as:  ULTRAM  Take 1-2 tablets (50-100 mg total) by mouth every 6 (six) hours as needed for severe pain.         [place], [place] 01/09/2014  5:40 PM

## 2014-01-10 ENCOUNTER — Encounter: Payer: Self-pay | Admitting: *Deleted

## 2014-01-18 ENCOUNTER — Encounter (HOSPITAL_COMMUNITY): Payer: Self-pay

## 2014-01-18 ENCOUNTER — Other Ambulatory Visit (HOSPITAL_COMMUNITY): Payer: Self-pay | Admitting: Maternal and Fetal Medicine

## 2014-01-18 ENCOUNTER — Ambulatory Visit (HOSPITAL_COMMUNITY)
Admission: RE | Admit: 2014-01-18 | Discharge: 2014-01-18 | Disposition: A | Discharge status: Home / Self Care | Payer: Medicaid Other | Source: Ambulatory Visit | Attending: Obstetrics & Gynecology | Admitting: Obstetrics & Gynecology

## 2014-01-18 DIAGNOSIS — O3410 Maternal care for benign tumor of corpus uteri, unspecified trimester: Secondary | ICD-10-CM | POA: Diagnosis present

## 2014-01-18 DIAGNOSIS — O30032 Twin pregnancy, monochorionic/diamniotic, second trimester: Secondary | ICD-10-CM | POA: Diagnosis present

## 2014-01-18 DIAGNOSIS — Z3A16 16 weeks gestation of pregnancy: Secondary | ICD-10-CM | POA: Insufficient documentation

## 2014-01-18 DIAGNOSIS — IMO0001 Reserved for inherently not codable concepts without codable children: Secondary | ICD-10-CM

## 2014-01-23 ENCOUNTER — Ambulatory Visit (INDEPENDENT_AMBULATORY_CARE_PROVIDER_SITE_OTHER): Payer: Medicaid Other | Admitting: Obstetrics & Gynecology

## 2014-01-23 VITALS — BP 128/70 | HR 95 | Temp 98.0°F | Wt 162.7 lb

## 2014-01-23 DIAGNOSIS — O30032 Twin pregnancy, monochorionic/diamniotic, second trimester: Secondary | ICD-10-CM

## 2014-01-23 LAB — POCT URINALYSIS DIP (DEVICE)
Bilirubin Urine: NEGATIVE
Glucose, UA: NEGATIVE mg/dL
Hgb urine dipstick: NEGATIVE
Nitrite: NEGATIVE
Protein, ur: NEGATIVE mg/dL
Specific Gravity, Urine: 1.025 (ref 1.005–1.030)
Urobilinogen, UA: 0.2 mg/dL (ref 0.0–1.0)
pH: 6 (ref 5.0–8.0)

## 2014-01-23 NOTE — Patient Instructions (Signed)
Multiple Pregnancy A multiple pregnancy is when a woman is pregnant with two or more fetuses. Multiple pregnancies occur in about 3% of all births. The more babies in a pregnancy, the greater the risks of problems to the babies and mother. This includes death. Since the use of Assisted Reproductive Technology (ART) and medications that can induce ovulation, multiple fetal gestation has increased.  RISKS TO THE MOTHER  Preeclampsia and eclampsia.  Postpartum bleeding (hemorrhage).  Kidney infection (pyelonephritis).  Develop anemia.  Develop diabetes.  Liver complications.  A blood clot blocks the artery, or branch of the artery leading to the lungs (pulmonary embolism).  Blood clots in the leg.  Placental separation.  Higher rate of Cesarean Section deliveries.  Women over 35 years old have a higher rate of Downs Syndrome babies. RISKS TO THE BABY  Preterm labor with a premature baby.  Very low birth weight babies that are less than 3 pounds, especially with triplets or mores.  Premature rupture of the membranes.  Twin to twin blood transfusion with one baby anemic and the other baby with too much blood in its system. There may also be heart failure.  With triplets or more, one of the babies is at high risk for cerebral palsy or other neurologic problem.  There is a higher incidence of fetal death. CARE OF MOTHERS WITH MULTIPLE FETAL GESTATION Multiple pregnancies need more care and special prenatal care.  You will see your caregiver more often.  You will have more tests including ultrasounds, nonstress tests and blood tests.  You will have special tests done called amniocentesis and a biophysical profile.  You may be hospitalized more often during the pregnancy.  You will be encouraged to eat a balanced and healthy diet with vitamin and mineral supplements as directed.  You will be asked to get more rest and sleep to keep up your energy.  You will be asked to  restrict your daily activities, exercise, work, household chores and sexual activity.  If you have preterm labor with small babies, you will be given a steroid injection to help the babies lungs mature and do better when born.  The delivery may have to be by Cesarean delivery, especially if there are triplets or more.  The delivery should be in a hospital with an intensive care nursery and Neonatologists (pediatrician for high risk babies) to care for the newborn babies. HOME CARE INSTRUCTIONS   Follow the caregiver's recommendations regarding office visits, tests for you and the babies, diet, rest and medications.  Avoid a large amount of physical activity.  Arrange to have help after the babies are born and when you go home from the hospital.  Take classes on how to care for multiple babies before you deliver them. SEEK IMMEDIATE MEDICAL CARE IF:   You develop a temperature of 102 F (38.9 C) or higher.  You are leaking fluid from the vagina.  You develop vaginal bleeding.  You develop uterine contractions.  You develop a severe headache, severe upper abdominal pain, visual problems or excessive swelling of your face, hands and feet.  You develop severe back pain or leg pain.  You develop severe tiredness.  You develop chest pain.  You have shortness of breath, fall down or pass out. Document Released: 11/26/2007 Document Revised: 05/11/2011 Document Reviewed: 01/20/2013 ExitCare Patient Information 2015 [employer]. This information is not intended to replace advice given to you by your health care provider. Make sure you discuss any questions you have with   your health care provider.

## 2014-01-23 NOTE — Progress Notes (Signed)
Has [place] scheduled 18 weeks at [facility]. Has N&V, phenergan helps

## 2014-01-31 ENCOUNTER — Ambulatory Visit (HOSPITAL_COMMUNITY): Payer: Medicaid Other

## 2014-02-01 ENCOUNTER — Ambulatory Visit (HOSPITAL_COMMUNITY)
Admission: RE | Admit: 2014-02-01 | Discharge: 2014-02-01 | Disposition: A | Discharge status: Home / Self Care | Payer: Medicaid Other | Source: Ambulatory Visit | Attending: Physician Assistant | Admitting: Physician Assistant

## 2014-02-01 ENCOUNTER — Other Ambulatory Visit (HOSPITAL_COMMUNITY): Payer: Self-pay | Admitting: Maternal and Fetal Medicine

## 2014-02-01 DIAGNOSIS — O3412 Maternal care for benign tumor of corpus uteri, second trimester: Secondary | ICD-10-CM | POA: Diagnosis present

## 2014-02-01 DIAGNOSIS — D259 Leiomyoma of uterus, unspecified: Secondary | ICD-10-CM | POA: Diagnosis not present

## 2014-02-01 DIAGNOSIS — IMO0001 Reserved for inherently not codable concepts without codable children: Secondary | ICD-10-CM

## 2014-02-01 DIAGNOSIS — O30032 Twin pregnancy, monochorionic/diamniotic, second trimester: Secondary | ICD-10-CM | POA: Insufficient documentation

## 2014-02-01 DIAGNOSIS — Z3A18 18 weeks gestation of pregnancy: Secondary | ICD-10-CM | POA: Diagnosis not present

## 2014-02-01 DIAGNOSIS — Z36 Encounter for antenatal screening of mother: Secondary | ICD-10-CM | POA: Insufficient documentation

## 2014-02-13 ENCOUNTER — Encounter: Payer: Self-pay | Admitting: Obstetrics and Gynecology

## 2014-02-13 ENCOUNTER — Ambulatory Visit (INDEPENDENT_AMBULATORY_CARE_PROVIDER_SITE_OTHER): Payer: Medicaid Other | Admitting: Obstetrics and Gynecology

## 2014-02-13 VITALS — BP 125/71 | HR 102 | Temp 97.9°F | Wt 164.4 lb

## 2014-02-13 DIAGNOSIS — O26899 Other specified pregnancy related conditions, unspecified trimester: Secondary | ICD-10-CM

## 2014-02-13 DIAGNOSIS — O30002 Twin pregnancy, unspecified number of placenta and unspecified number of amniotic sacs, second trimester: Secondary | ICD-10-CM

## 2014-02-13 DIAGNOSIS — O360122 Maternal care for anti-D [Rh] antibodies, second trimester, fetus 2: Secondary | ICD-10-CM

## 2014-02-13 DIAGNOSIS — O9989 Other specified diseases and conditions complicating pregnancy, childbirth and the puerperium: Secondary | ICD-10-CM

## 2014-02-13 DIAGNOSIS — R140 Abdominal distension (gaseous): Secondary | ICD-10-CM

## 2014-02-13 DIAGNOSIS — R109 Unspecified abdominal pain: Secondary | ICD-10-CM

## 2014-02-13 DIAGNOSIS — O30032 Twin pregnancy, monochorionic/diamniotic, second trimester: Secondary | ICD-10-CM

## 2014-02-13 LAB — POCT URINALYSIS DIP (DEVICE)
Bilirubin Urine: NEGATIVE
Glucose, UA: NEGATIVE mg/dL
Hgb urine dipstick: NEGATIVE
Leukocytes, UA: NEGATIVE
Nitrite: NEGATIVE
Protein, ur: NEGATIVE mg/dL
Specific Gravity, Urine: >=1.03 (ref 1.005–1.030)
Urobilinogen, UA: 0.2 mg/dL (ref 0.0–1.0)
pH: 6 (ref 5.0–8.0)

## 2014-02-13 MED ORDER — PROMETHAZINE HCL 25 MG PO TABS: 25.0000 mg | ORAL_TABLET | Freq: Four times a day (QID) | ORAL | Status: DC | PRN

## 2014-02-13 MED ORDER — SILVER SULFADIAZINE 1 % EX CREA: 1.0000 "application " | TOPICAL_CREAM | Freq: Every day | CUTANEOUS | Status: DC

## 2014-02-13 NOTE — Progress Notes (Signed)
Reports occasional pelvic cramping.

## 2014-02-13 NOTE — Progress Notes (Signed)
Patient is doing well without complaints. She reports some persistent nausea and occasional emesis. Patient burned her left hand 4 days ago Rx silvadene cream provided. Ultrasound results reviewed with the patient. AFP today

## 2014-02-13 NOTE — Addendum Note (Signed)
Addended by: Geanie Logan on: 02/13/2014 01:50 PM   Modules accepted: Orders, Medications

## 2014-02-15 ENCOUNTER — Other Ambulatory Visit (HOSPITAL_COMMUNITY): Payer: Medicaid Other

## 2014-02-15 ENCOUNTER — Ambulatory Visit (HOSPITAL_COMMUNITY)
Admission: RE | Admit: 2014-02-15 | Discharge: 2014-02-15 | Disposition: A | Discharge status: Home / Self Care | Payer: Medicaid Other | Source: Ambulatory Visit | Attending: Physician Assistant | Admitting: Physician Assistant

## 2014-02-15 ENCOUNTER — Other Ambulatory Visit (HOSPITAL_COMMUNITY): Payer: Self-pay | Admitting: Maternal and Fetal Medicine

## 2014-02-15 DIAGNOSIS — Z3A20 20 weeks gestation of pregnancy: Secondary | ICD-10-CM | POA: Diagnosis not present

## 2014-02-15 DIAGNOSIS — O30032 Twin pregnancy, monochorionic/diamniotic, second trimester: Secondary | ICD-10-CM | POA: Diagnosis present

## 2014-02-15 DIAGNOSIS — D259 Leiomyoma of uterus, unspecified: Secondary | ICD-10-CM | POA: Insufficient documentation

## 2014-02-15 DIAGNOSIS — O30039 Twin pregnancy, monochorionic/diamniotic, unspecified trimester: Secondary | ICD-10-CM

## 2014-02-15 DIAGNOSIS — O3412 Maternal care for benign tumor of corpus uteri, second trimester: Secondary | ICD-10-CM | POA: Diagnosis not present

## 2014-02-15 DIAGNOSIS — IMO0001 Reserved for inherently not codable concepts without codable children: Secondary | ICD-10-CM

## 2014-02-15 LAB — ALPHA FETOPROTEIN, MATERNAL
AFP: 142.6 ng/mL
Curr Gest Age: 19.5 wks.days
MoM for AFP: 2.43
Open Spina bifida: NEGATIVE
Osb Risk: 1:1600 {titer}

## 2014-02-17 ENCOUNTER — Inpatient Hospital Stay (HOSPITAL_COMMUNITY)
Admission: AD | Admit: 2014-02-17 | Discharge: 2014-02-17 | Disposition: A | Discharge status: Home / Self Care | Payer: Medicaid Other | Source: Ambulatory Visit | Attending: Obstetrics & Gynecology | Admitting: Obstetrics & Gynecology

## 2014-02-17 ENCOUNTER — Encounter (HOSPITAL_COMMUNITY): Payer: Self-pay | Admitting: *Deleted

## 2014-02-17 DIAGNOSIS — O9989 Other specified diseases and conditions complicating pregnancy, childbirth and the puerperium: Secondary | ICD-10-CM | POA: Diagnosis not present

## 2014-02-17 DIAGNOSIS — Z3A20 20 weeks gestation of pregnancy: Secondary | ICD-10-CM | POA: Diagnosis not present

## 2014-02-17 DIAGNOSIS — O30012 Twin pregnancy, monochorionic/monoamniotic, second trimester: Secondary | ICD-10-CM | POA: Insufficient documentation

## 2014-02-17 DIAGNOSIS — N949 Unspecified condition associated with female genital organs and menstrual cycle: Secondary | ICD-10-CM | POA: Diagnosis not present

## 2014-02-17 DIAGNOSIS — R109 Unspecified abdominal pain: Secondary | ICD-10-CM | POA: Diagnosis present

## 2014-02-17 DIAGNOSIS — O26899 Other specified pregnancy related conditions, unspecified trimester: Secondary | ICD-10-CM

## 2014-02-17 DIAGNOSIS — R102 Pelvic and perineal pain: Secondary | ICD-10-CM

## 2014-02-17 DIAGNOSIS — O30032 Twin pregnancy, monochorionic/diamniotic, second trimester: Secondary | ICD-10-CM | POA: Diagnosis not present

## 2014-02-17 LAB — WET PREP, GENITAL
Clue Cells Wet Prep HPF POC: NONE SEEN
Trich, Wet Prep: NONE SEEN
Yeast Wet Prep HPF POC: NONE SEEN

## 2014-02-17 LAB — URINALYSIS, ROUTINE W REFLEX MICROSCOPIC
Bilirubin Urine: NEGATIVE
Glucose, UA: NEGATIVE mg/dL
Hgb urine dipstick: NEGATIVE
Ketones, ur: NEGATIVE mg/dL
Leukocytes, UA: NEGATIVE
Nitrite: NEGATIVE
Protein, ur: NEGATIVE mg/dL
Specific Gravity, Urine: >1.03 — ABNORMAL HIGH (ref 1.005–1.030)
Urobilinogen, UA: 0.2 mg/dL (ref 0.0–1.0)
pH: 5.5 (ref 5.0–8.0)

## 2014-02-17 MED ORDER — OXYCODONE-ACETAMINOPHEN 5-325 MG PO TABS
2.0000 | ORAL_TABLET | Freq: Once | ORAL | Status: AC
  Administered 2014-02-17: 2 via ORAL
  Filled 2014-02-17: qty 2

## 2014-02-17 NOTE — MAU Provider Note (Signed)
History     CSN: [id number]  Arrival date and time: 02/17/14 1709   First Provider Initiated Contact with Patient 02/17/14 1751      Chief Complaint  Patient presents with  . Abdominal Pain   HPI 28 y.o. G1P0 at [redacted]w[redacted]d with mono-di twins w/ abdominal "tightness" and pain in bilateral inguinal areas x 2 days. Pain is constant, worse w/ movement and position change. No bleeding or discharge. + fetal movement. Has U/S scheduled this week.   Past Medical History  Diagnosis Date  . Uterine fibroid     Past Surgical History  Procedure Laterality Date  . No past surgeries      History reviewed. No pertinent family history.  History  Substance Use Topics  . Smoking status: Never Smoker   . Smokeless tobacco: Never Used  . Alcohol Use: No     Comment: occasional    Allergies: No Known Allergies  Prescriptions prior to admission  Medication Sig Dispense Refill Last Dose  . acetaminophen (TYLENOL) 325 MG tablet Take 650 mg by mouth every 6 (six) hours as needed for mild pain or headache.    Past Week at Unknown time  . Prenatal Vit-Fe Fumarate-FA (PRENATAL MULTIVITAMIN) TABS tablet Take 1 tablet by mouth daily.    02/17/2014 at Unknown time  . promethazine (PHENERGAN) 25 MG tablet Take 1 tablet (25 mg total) by mouth every 6 (six) hours as needed for nausea or vomiting. 30 tablet 1 02/16/2014 at Unknown time  . silver sulfADIAZINE (SILVADENE) 1 % cream Apply 1 application topically daily. 50 g 0 02/17/2014 at Unknown time  . polyethylene glycol (MIRALAX / GLYCOLAX) packet Take 17 g by mouth daily. X 4 days, then daily as needed. (Patient not taking: Reported on 02/13/2014) 14 each 0 Not Taking  . traMADol (ULTRAM) 50 MG tablet Take 1-2 tablets (50-100 mg total) by mouth every 6 (six) hours as needed for severe pain. (Patient not taking: Reported on 02/13/2014) 30 tablet 0 Not Taking    Review of Systems  Constitutional: Negative.   Respiratory: Negative.   Cardiovascular:  Negative.   Gastrointestinal: Positive for abdominal pain. Negative for nausea, vomiting, diarrhea and constipation.  Genitourinary: Negative for dysuria, urgency, frequency, hematuria and flank pain.       Negative for vaginal bleeding, cramping/contractions  Musculoskeletal: Negative.   Neurological: Negative.   Psychiatric/Behavioral: Negative.    Physical Exam   Blood pressure 136/77, pulse 98, temperature 98.5 F (36.9 C), temperature source Oral, resp. rate 16, height 4\' 11"  (1.499 m), weight 164 lb 6 oz (74.56 kg), last menstrual period 10/03/2013.  Physical Exam  Nursing note and vitals reviewed. Constitutional: She is oriented to person, place, and time. She appears well-developed and well-nourished. No distress.  HENT:  Head: Normocephalic and atraumatic.  Cardiovascular: Normal rate.   Respiratory: Effort normal.  GI: Soft. Bowel sounds are normal. She exhibits no mass. There is tenderness (diffuse, increased in bilateral lower abd). There is no rebound and no guarding.  Genitourinary: There is no rash or lesion on the right labia. There is no rash or lesion on the left labia. Uterus is not tender. Enlarged: Size c/w dates. Cervix exhibits no motion tenderness, no discharge and no friability. Right adnexum displays no mass, no tenderness and no fullness. Left adnexum displays no mass, no tenderness and no fullness. No tenderness or bleeding in the vagina. No vaginal discharge found.  Musculoskeletal: Normal range of motion.  Neurological: She is alert and oriented to  person, place, and time.  Skin: Skin is warm and dry.  Psychiatric: She has a normal mood and affect.   + FHR x 2 TOCO: quiet  MAU Course  Procedures Results for orders placed or performed during the hospital encounter of 02/17/14 (from the past 24 hour(s))  Urinalysis, Routine w reflex microscopic     Status: Abnormal   Collection Time: 02/17/14  5:29 PM  Result Value Ref Range   Color, Urine YELLOW YELLOW    APPearance CLEAR CLEAR   Specific Gravity, Urine >1.030 (H) 1.005 - 1.030   pH 5.5 5.0 - 8.0   Glucose, UA NEGATIVE NEGATIVE mg/dL   Hgb urine dipstick NEGATIVE NEGATIVE   Bilirubin Urine NEGATIVE NEGATIVE   Ketones, ur NEGATIVE NEGATIVE mg/dL   Protein, ur NEGATIVE NEGATIVE mg/dL   Urobilinogen, UA 0.2 0.0 - 1.0 mg/dL   Nitrite NEGATIVE NEGATIVE   Leukocytes, UA NEGATIVE NEGATIVE  Wet prep, genital     Status: Abnormal   Collection Time: 02/17/14  5:55 PM  Result Value Ref Range   Yeast Wet Prep HPF POC NONE SEEN NONE SEEN   Trich, Wet Prep NONE SEEN NONE SEEN   Clue Cells Wet Prep HPF POC NONE SEEN NONE SEEN   WBC, Wet Prep HPF POC FEW (A) NONE SEEN     Assessment and Plan   1. Abdominal pain in pregnancy, antepartum   2. Pain of round ligament affecting pregnancy, antepartum   Normal exam, likely discomfort r/t normal pregnancy changes. Rev'd precautions, f/u as scheduled.     Medication List    STOP taking these medications        polyethylene glycol packet  Commonly known as:  MIRALAX / GLYCOLAX     traMADol 50 MG tablet  Commonly known as:  ULTRAM      TAKE these medications        acetaminophen 325 MG tablet  Commonly known as:  TYLENOL  Take 650 mg by mouth every 6 (six) hours as needed for mild pain or headache.     prenatal multivitamin Tabs tablet  Take 1 tablet by mouth daily.     promethazine 25 MG tablet  Commonly known as:  PHENERGAN  Take 1 tablet (25 mg total) by mouth every 6 (six) hours as needed for nausea or vomiting.     silver sulfADIAZINE 1 % cream  Commonly known as:  SILVADENE  Apply 1 application topically daily.        Follow-up Information    Follow up with [facility].   Specialty:  Obstetrics and Gynecology   Why:  as scheduled or sooner as needed   Contact information:   [place] 02/17/2014, 6:48 PM

## 2014-02-17 NOTE — MAU Note (Signed)
Pt states here for tightening in abdomen that has been going on for a few days. Denies bleeding or abnormal vaginal discharge.

## 2014-02-19 LAB — GC/CHLAMYDIA PROBE AMP
CT Probe RNA: NEGATIVE
GC Probe RNA: NEGATIVE

## 2014-02-20 ENCOUNTER — Other Ambulatory Visit (HOSPITAL_COMMUNITY): Payer: Self-pay | Admitting: Maternal and Fetal Medicine

## 2014-02-20 ENCOUNTER — Ambulatory Visit (HOSPITAL_COMMUNITY)
Admission: RE | Admit: 2014-02-20 | Discharge: 2014-02-20 | Disposition: A | Discharge status: Home / Self Care | Payer: Medicaid Other | Source: Ambulatory Visit | Attending: Physician Assistant | Admitting: Physician Assistant

## 2014-02-20 DIAGNOSIS — IMO0001 Reserved for inherently not codable concepts without codable children: Secondary | ICD-10-CM

## 2014-02-20 DIAGNOSIS — O30039 Twin pregnancy, monochorionic/diamniotic, unspecified trimester: Secondary | ICD-10-CM | POA: Insufficient documentation

## 2014-02-21 ENCOUNTER — Inpatient Hospital Stay (HOSPITAL_COMMUNITY): Payer: Self-pay | Admitting: *Deleted

## 2014-02-21 ENCOUNTER — Inpatient Hospital Stay (HOSPITAL_COMMUNITY)
Admission: AD | Admit: 2014-02-21 | Discharge: 2014-02-21 | Disposition: A | Discharge status: Home / Self Care | Payer: Medicaid Other | Source: Ambulatory Visit | Attending: Obstetrics and Gynecology | Admitting: Obstetrics and Gynecology

## 2014-02-21 DIAGNOSIS — O30032 Twin pregnancy, monochorionic/diamniotic, second trimester: Secondary | ICD-10-CM | POA: Diagnosis not present

## 2014-02-21 DIAGNOSIS — O3412 Maternal care for benign tumor of corpus uteri, second trimester: Secondary | ICD-10-CM | POA: Diagnosis not present

## 2014-02-21 DIAGNOSIS — Z3A20 20 weeks gestation of pregnancy: Secondary | ICD-10-CM | POA: Diagnosis not present

## 2014-02-21 DIAGNOSIS — D259 Leiomyoma of uterus, unspecified: Secondary | ICD-10-CM | POA: Diagnosis not present

## 2014-02-21 DIAGNOSIS — R109 Unspecified abdominal pain: Secondary | ICD-10-CM | POA: Diagnosis present

## 2014-02-21 DIAGNOSIS — O9989 Other specified diseases and conditions complicating pregnancy, childbirth and the puerperium: Secondary | ICD-10-CM | POA: Insufficient documentation

## 2014-02-21 LAB — URINALYSIS, ROUTINE W REFLEX MICROSCOPIC
Bilirubin Urine: NEGATIVE
Glucose, UA: NEGATIVE mg/dL
Hgb urine dipstick: NEGATIVE
Ketones, ur: >80 mg/dL — AB
Leukocytes, UA: NEGATIVE
Nitrite: NEGATIVE
Protein, ur: NEGATIVE mg/dL
Specific Gravity, Urine: >1.03 — ABNORMAL HIGH (ref 1.005–1.030)
Urobilinogen, UA: 0.2 mg/dL (ref 0.0–1.0)
pH: 6 (ref 5.0–8.0)

## 2014-02-21 MED ORDER — OXYCODONE-ACETAMINOPHEN 7.5-325 MG PO TABS: 1.0000 | ORAL_TABLET | ORAL | Status: DC | PRN

## 2014-02-21 NOTE — Progress Notes (Signed)
Discussed at length, importance of nutrition and hydration. Patient states she has been at work all day and has not eaten. Informed patient that she must eat and drink small amounts throughout the day everyday.

## 2014-02-21 NOTE — MAU Provider Note (Signed)
  History     CSN: [id number]  Arrival date and time: 02/21/14 1521   None     Chief Complaint  Patient presents with  . Abdominal Pain   HPI Patient is 28 y.o. G1P0 [redacted]w[redacted]d pregnant with mono-di twins here with complaints of right abdominal pain, feels lumps.  Pain occasionally severe, has taken tramadol with minimal improvement.  No alleviating/aggravating factors, pain overall different then when she was here this past week for abdominal pain.  +FM, denies LOF, VB, contractions, vaginal discharge.  Past Medical History  Diagnosis Date  . Uterine fibroid     Past Surgical History  Procedure Laterality Date  . No past surgeries      History reviewed. No pertinent family history.  History  Substance Use Topics  . Smoking status: Never Smoker   . Smokeless tobacco: Never Used  . Alcohol Use: No     Comment: occasional    Allergies: No Known Allergies  Prescriptions prior to admission  Medication Sig Dispense Refill Last Dose  . acetaminophen (TYLENOL) 325 MG tablet Take 650 mg by mouth every 6 (six) hours as needed for mild pain or headache.    Past Week at Unknown time  . Prenatal Vit-Fe Fumarate-FA (PRENATAL MULTIVITAMIN) TABS tablet Take 1 tablet by mouth daily.    02/20/2014 at Unknown time  . promethazine (PHENERGAN) 25 MG tablet Take 1 tablet (25 mg total) by mouth every 6 (six) hours as needed for nausea or vomiting. 30 tablet 1 02/20/2014 at Unknown time  . silver sulfADIAZINE (SILVADENE) 1 % cream Apply 1 application topically daily. 50 g 0 02/21/2014 at Unknown time    Review of Systems  Constitutional: Negative for fever and chills.  Respiratory: Negative for cough and shortness of breath.   Cardiovascular: Negative for chest pain and leg swelling.  Gastrointestinal: Positive for nausea and abdominal pain. Negative for heartburn, vomiting and diarrhea.  Genitourinary: Negative for dysuria, urgency, frequency and hematuria.  Neurological:       No  headache   Physical Exam   Blood pressure 130/70, pulse 88, temperature 98.7 F (37.1 C), temperature source Oral, resp. rate 16, height 4' 10.5" (1.486 m), weight 164 lb 6.4 oz (74.571 kg), last menstrual period 10/03/2013, SpO2 98 %.  Physical Exam  Constitutional: She is oriented to person, place, and time. She appears well-developed and well-nourished.  HENT:  Head: Normocephalic and atraumatic.  Eyes: Conjunctivae and EOM are normal.  Neck: Normal range of motion.  Cardiovascular: Normal rate, regular rhythm and normal heart sounds.   Respiratory: Effort normal. No respiratory distress.  GI: Soft. She exhibits mass (4cm mass fibroid right lateral uterus palpable). She exhibits no distension. There is tenderness.  Musculoskeletal: Normal range of motion. She exhibits no edema.  Neurological: She is alert and oriented to person, place, and time.  Skin: Skin is warm and dry. No erythema.    MAU Course  Procedures  MDM Previous MRI reviewed: 4.5cm right lateral fibroid noted  Assessment and Plan  Patient is 28 y.o. G1P0 [redacted]w[redacted]d reporting right sided abdominal likely secondary to fibroids - rx percocet 7.5/325mg  #30, no RF.  Pt advised that this is a one time rx, may not be able to receive refills in clinic. - no suspicion of appendicitis   Itamar Mcgowan ROCIO 02/21/2014, 5:48 PM

## 2014-02-21 NOTE — Discharge Instructions (Signed)
Fibroids Fibroids are lumps (tumors) that can occur any place in a woman's body. These lumps are not cancerous. Fibroids vary in size, weight, and where they grow. HOME CARE  Do not take aspirin.  Write down the number of pads or tampons you use during your period. Tell your doctor. This can help determine the best treatment for you. GET HELP RIGHT AWAY IF:  You have pain in your lower belly (abdomen) that is not helped with medicine.  You have cramps that are not helped with medicine.  You have more bleeding between or during your period.  You feel lightheaded or pass out (faint).  Your lower belly pain gets worse. MAKE SURE YOU:  Understand these instructions.  Will watch your condition.  Will get help right away if you are not doing well or get worse. Document Released: 03/21/2010 Document Revised: 05/11/2011 Document Reviewed: 03/21/2010 ExitCare Patient Information 2015 [employer]. This information is not intended to replace advice given to you by your health care provider. Make sure you discuss any questions you have with your health care provider.  

## 2014-02-21 NOTE — MAU Note (Signed)
Patient has twins. Has noticed a lump on the right side of the umbilicus that is painful to touch. Has a know fibroid. States she is having abdominal pain. Denies bleeding or leaking. Feels full. Feels fetal movement.

## 2014-02-28 ENCOUNTER — Encounter (HOSPITAL_COMMUNITY): Payer: Self-pay | Admitting: *Deleted

## 2014-02-28 ENCOUNTER — Inpatient Hospital Stay (HOSPITAL_COMMUNITY): Payer: Medicaid Other

## 2014-02-28 ENCOUNTER — Inpatient Hospital Stay (HOSPITAL_COMMUNITY)
Admission: AD | Admit: 2014-02-28 | Discharge: 2014-02-28 | Disposition: A | Discharge status: Home / Self Care | Payer: Medicaid Other | Source: Ambulatory Visit | Attending: Obstetrics & Gynecology | Admitting: Obstetrics & Gynecology

## 2014-02-28 DIAGNOSIS — R109 Unspecified abdominal pain: Secondary | ICD-10-CM | POA: Diagnosis present

## 2014-02-28 DIAGNOSIS — O26872 Cervical shortening, second trimester: Secondary | ICD-10-CM | POA: Insufficient documentation

## 2014-02-28 DIAGNOSIS — N949 Unspecified condition associated with female genital organs and menstrual cycle: Secondary | ICD-10-CM | POA: Diagnosis not present

## 2014-02-28 DIAGNOSIS — Z3A21 21 weeks gestation of pregnancy: Secondary | ICD-10-CM | POA: Insufficient documentation

## 2014-02-28 DIAGNOSIS — O26899 Other specified pregnancy related conditions, unspecified trimester: Secondary | ICD-10-CM | POA: Insufficient documentation

## 2014-02-28 DIAGNOSIS — O9989 Other specified diseases and conditions complicating pregnancy, childbirth and the puerperium: Secondary | ICD-10-CM | POA: Insufficient documentation

## 2014-02-28 LAB — URINE MICROSCOPIC-ADD ON

## 2014-02-28 LAB — URINALYSIS, ROUTINE W REFLEX MICROSCOPIC
Bilirubin Urine: NEGATIVE
Glucose, UA: NEGATIVE mg/dL
Hgb urine dipstick: NEGATIVE
Ketones, ur: 15 mg/dL — AB
Leukocytes, UA: NEGATIVE
Nitrite: NEGATIVE
Protein, ur: 30 mg/dL — AB
Specific Gravity, Urine: >1.03 — ABNORMAL HIGH (ref 1.005–1.030)
Urobilinogen, UA: 1 mg/dL (ref 0.0–1.0)
pH: 6 (ref 5.0–8.0)

## 2014-02-28 NOTE — MAU Note (Signed)
[facility] CNM called and inform of pt 's presence on the unit

## 2014-02-28 NOTE — MAU Note (Signed)
Pt states she is having pain in both sides. Pt states she couldn't get out the bed but she could not. Pt states she had some percocet that she took but it did not help

## 2014-02-28 NOTE — MAU Provider Note (Signed)
History     CSN: [id number]  Arrival date and time: 02/28/14 9518   None     Chief Complaint  Patient presents with  . Abdominal Pain   HPI  Pt is a G1P0 at [redacted]w[redacted]d wk IUP here for bilat lower pelvic pain that began a few hours ago.  Nausea and vomiting that started today with two incidents of vomiting.  Pain is described as a sharp pain that is constant in nature.  Denies vaginal bleeding, abnormal vaginal discharge or leaking of fluid.    Past Medical History  Diagnosis Date  . Uterine fibroid     Past Surgical History  Procedure Laterality Date  . No past surgeries      No family history on file.  History  Substance Use Topics  . Smoking status: Never Smoker   . Smokeless tobacco: Never Used  . Alcohol Use: No     Comment: occasional    Allergies: Not on File  Prescriptions prior to admission  Medication Sig Dispense Refill Last Dose  . acetaminophen (TYLENOL) 325 MG tablet Take 650 mg by mouth every 6 (six) hours as needed for mild pain or headache.    Past Week at Unknown time  . oxyCODONE-acetaminophen (PERCOCET) 7.5-325 MG per tablet Take 1 tablet by mouth every 4 (four) hours as needed for pain. 30 tablet 0 02/28/2014 at 1300  . Prenatal Vit-Fe Fumarate-FA (PRENATAL MULTIVITAMIN) TABS tablet Take 1 tablet by mouth daily.    02/27/2014 at 2200  . promethazine (PHENERGAN) 25 MG tablet Take 1 tablet (25 mg total) by mouth every 6 (six) hours as needed for nausea or vomiting. 30 tablet 1 02/28/2014 at 1000  . silver sulfADIAZINE (SILVADENE) 1 % cream Apply 1 application topically daily. 50 g 0 02/27/2014 at 2200    Review of Systems  Constitutional: Negative for fever and chills.  Gastrointestinal: Positive for nausea, vomiting and abdominal pain (bilat pelvis).  Genitourinary: Negative for dysuria, urgency and frequency.  All other systems reviewed and are negative.  Physical Exam   Blood pressure 137/67, pulse 101, temperature 99.3 F (37.4 C),  temperature source Oral, resp. rate 18, height 4' 11.5" (1.511 m), weight 76.295 kg (168 lb 3.2 oz), last menstrual period 10/03/2013, SpO2 98 %.  Physical Exam  Constitutional: She is oriented to person, place, and time. She appears well-developed and well-nourished.  HENT:  Head: Normocephalic.  Neck: Normal range of motion. Neck supple.  Cardiovascular: Normal rate, regular rhythm and normal heart sounds.   Respiratory: Effort normal and breath sounds normal.  GI: Soft. There is tenderness (diffuse).  Genitourinary: No bleeding in the vagina. Vaginal discharge (mucusy) found.  Questionable short cervix  Neurological: She is alert and oriented to person, place, and time.  Skin: Skin is warm and dry.    MAU Course  Procedures Results for orders placed or performed during the hospital encounter of 02/28/14 (from the past 24 hour(s))  Urinalysis, Routine w reflex microscopic     Status: Abnormal   Collection Time: 02/28/14  7:00 PM  Result Value Ref Range   Color, Urine YELLOW YELLOW   APPearance CLEAR CLEAR   Specific Gravity, Urine >1.030 (H) 1.005 - 1.030   pH 6.0 5.0 - 8.0   Glucose, UA NEGATIVE NEGATIVE mg/dL   Hgb urine dipstick NEGATIVE NEGATIVE   Bilirubin Urine NEGATIVE NEGATIVE   Ketones, ur 15 (A) NEGATIVE mg/dL   Protein, ur 30 (A) NEGATIVE mg/dL   Urobilinogen, UA 1.0 0.0 -  1.0 mg/dL   Nitrite NEGATIVE NEGATIVE   Leukocytes, UA NEGATIVE NEGATIVE  Urine microscopic-add on     Status: Abnormal   Collection Time: 02/28/14  7:00 PM  Result Value Ref Range   Squamous Epithelial / LPF MANY (A) RARE   WBC, UA 3-6 <3 WBC/hpf   RBC / HPF 0-2 <3 RBC/hpf   Bacteria, UA MANY (A) RARE   Urine-Other MUCOUS PRESENT     2146 Report called to Derrill Memo who assumes care of patient.  Venia Carbon Michiel Cowboy, CNM   U/S: cx length 3cm  Assessment and Plan  Twin IUP@21 .6 wks Low abd discomfort  D/C home with PTL precautions and encouraged her that her cx is not shortened Has  antiemetic at home already Rec Tylenol for discomfort (pt requesting stronger pain meds but I declined to give them to her). F/U tomorrow 12/31 as scheduled for ultrasound, and also for next OB appt on 03/13/14.  Serita Grammes CNM 02/28/2014 11:25 PM

## 2014-02-28 NOTE — MAU Note (Signed)
Dr  Lorelee New informed of pt's presence on the unit.

## 2014-02-28 NOTE — MAU Note (Signed)
Pt left unit refusing to have vital signs to taken prior to discharge. Pt stating "I have to f-----g find a F------g  ride home now'

## 2014-02-28 NOTE — MAU Note (Signed)
Dr Lorelee New informed of pt presence on the unit.

## 2014-02-28 NOTE — MAU Note (Signed)
Patient states she has been having pain on both sides off and on all week but worse today. Denies bleeding or leaking. Nausea with some vomiting. Feeling fetal movement.

## 2014-02-28 NOTE — Discharge Instructions (Signed)

## 2014-03-01 ENCOUNTER — Encounter (HOSPITAL_COMMUNITY): Payer: Self-pay

## 2014-03-01 ENCOUNTER — Other Ambulatory Visit: Payer: Self-pay | Admitting: Family

## 2014-03-01 ENCOUNTER — Ambulatory Visit (HOSPITAL_COMMUNITY)
Admission: RE | Admit: 2014-03-01 | Discharge: 2014-03-01 | Disposition: A | Discharge status: Home / Self Care | Payer: Medicaid Other | Source: Ambulatory Visit | Attending: Physician Assistant | Admitting: Physician Assistant

## 2014-03-01 DIAGNOSIS — R109 Unspecified abdominal pain: Secondary | ICD-10-CM | POA: Diagnosis not present

## 2014-03-01 DIAGNOSIS — O26899 Other specified pregnancy related conditions, unspecified trimester: Secondary | ICD-10-CM | POA: Insufficient documentation

## 2014-03-01 DIAGNOSIS — O9989 Other specified diseases and conditions complicating pregnancy, childbirth and the puerperium: Secondary | ICD-10-CM | POA: Insufficient documentation

## 2014-03-01 DIAGNOSIS — IMO0001 Reserved for inherently not codable concepts without codable children: Secondary | ICD-10-CM

## 2014-03-01 DIAGNOSIS — O26872 Cervical shortening, second trimester: Secondary | ICD-10-CM | POA: Diagnosis present

## 2014-03-01 DIAGNOSIS — Z3A21 21 weeks gestation of pregnancy: Secondary | ICD-10-CM | POA: Insufficient documentation

## 2014-03-03 DIAGNOSIS — IMO0002 Reserved for concepts with insufficient information to code with codable children: Secondary | ICD-10-CM | POA: Insufficient documentation

## 2014-03-03 DIAGNOSIS — Z0489 Encounter for examination and observation for other specified reasons: Secondary | ICD-10-CM | POA: Insufficient documentation

## 2014-03-03 DIAGNOSIS — Z3A22 22 weeks gestation of pregnancy: Secondary | ICD-10-CM | POA: Insufficient documentation

## 2014-03-04 ENCOUNTER — Encounter (HOSPITAL_COMMUNITY): Payer: Self-pay

## 2014-03-04 ENCOUNTER — Inpatient Hospital Stay (HOSPITAL_COMMUNITY)
Admission: AD | Admit: 2014-03-04 | Discharge: 2014-03-04 | Disposition: A | Discharge status: Home / Self Care | Payer: Medicaid Other | Source: Ambulatory Visit | Attending: Obstetrics & Gynecology | Admitting: Obstetrics & Gynecology

## 2014-03-04 DIAGNOSIS — Z3A22 22 weeks gestation of pregnancy: Secondary | ICD-10-CM | POA: Diagnosis not present

## 2014-03-04 DIAGNOSIS — R109 Unspecified abdominal pain: Secondary | ICD-10-CM | POA: Diagnosis present

## 2014-03-04 DIAGNOSIS — O9989 Other specified diseases and conditions complicating pregnancy, childbirth and the puerperium: Secondary | ICD-10-CM | POA: Diagnosis not present

## 2014-03-04 LAB — URINALYSIS, ROUTINE W REFLEX MICROSCOPIC
Bilirubin Urine: NEGATIVE
Glucose, UA: NEGATIVE mg/dL
Hgb urine dipstick: NEGATIVE
Ketones, ur: NEGATIVE mg/dL
Leukocytes, UA: NEGATIVE
Nitrite: NEGATIVE
Protein, ur: NEGATIVE mg/dL
Specific Gravity, Urine: >1.03 — ABNORMAL HIGH (ref 1.005–1.030)
Urobilinogen, UA: 1 mg/dL (ref 0.0–1.0)
pH: 6 (ref 5.0–8.0)

## 2014-03-04 MED ORDER — ACETAMINOPHEN-CODEINE #3 300-30 MG PO TABS
2.0000 | ORAL_TABLET | Freq: Once | ORAL | Status: AC
  Administered 2014-03-04: 2 via ORAL
  Filled 2014-03-04: qty 2

## 2014-03-04 NOTE — MAU Note (Signed)
Pt states began having abdominal pain after breakfast this am. Has had constipation and finally had bowel mvmt, however then last pm began having back pain. Denies bleeding or abnormal vaginal discharge.

## 2014-03-04 NOTE — MAU Provider Note (Signed)
History     CSN: [id number]  Arrival date and time: 03/04/14 0901   None     Chief Complaint  Patient presents with  . Back Pain  . Abdominal Pain   Back Pain This is a chronic problem. The current episode started 1 to 4 weeks ago. The problem occurs constantly. The problem has been gradually worsening since onset. The pain is present in the lumbar spine. The quality of the pain is described as aching and shooting. Radiates to: from abd to lower back. The pain is at a severity of 7/10. The pain is moderate. The pain is worse during the day. The symptoms are aggravated by bending, position, standing and sitting. Associated symptoms include abdominal pain.  Abdominal Pain   G1 at 22.3 wks in with c/o sharp shooting pain in abd and back that has been ongoing for a while and is getting progressively worse.  OB History    Gravida Para Term Preterm AB TAB SAB Ectopic Multiple Living   1               Past Medical History  Diagnosis Date  . Uterine fibroid     Past Surgical History  Procedure Laterality Date  . No past surgeries      History reviewed. No pertinent family history.  History  Substance Use Topics  . Smoking status: Never Smoker   . Smokeless tobacco: Never Used  . Alcohol Use: No     Comment: occasional    Allergies: No Known Allergies  Prescriptions prior to admission  Medication Sig Dispense Refill Last Dose  . acetaminophen (TYLENOL) 325 MG tablet Take 650 mg by mouth every 6 (six) hours as needed for mild pain or headache.    Past Week at Unknown time  . oxyCODONE-acetaminophen (PERCOCET) 7.5-325 MG per tablet Take 1 tablet by mouth every 4 (four) hours as needed for pain. 30 tablet 0 Past Week at Unknown time  . Prenatal Vit-Fe Fumarate-FA (PRENATAL MULTIVITAMIN) TABS tablet Take 1 tablet by mouth at bedtime.    03/03/2014 at Unknown time  . promethazine (PHENERGAN) 25 MG tablet Take 1 tablet (25 mg total) by mouth every 6 (six) hours as needed for  nausea or vomiting. 30 tablet 1 03/03/2014 at Unknown time  . silver sulfADIAZINE (SILVADENE) 1 % cream Apply 1 application topically daily. 50 g 0 Past Week at Unknown time    Review of Systems  Constitutional: Negative.   HENT: Negative.   Eyes: Negative.   Respiratory: Negative.   Cardiovascular: Negative.   Gastrointestinal: Positive for abdominal pain.  Genitourinary: Negative.   Musculoskeletal: Positive for back pain.  Skin: Negative.   Neurological: Negative.   Endo/Heme/Allergies: Negative.   Psychiatric/Behavioral: Negative.    Physical Exam   Blood pressure 147/80, pulse 114, temperature 98.9 F (37.2 C), temperature source Oral, resp. rate 16, last menstrual period 10/03/2013.  Physical Exam  Constitutional: She is oriented to person, place, and time. She appears well-developed and well-nourished.  HENT:  Head: Normocephalic.  Eyes: Pupils are equal, round, and reactive to light.  Neck: Normal range of motion.  Cardiovascular: Normal rate, regular rhythm, normal heart sounds and intact distal pulses.   Respiratory: Effort normal and breath sounds normal.  GI: Soft. Bowel sounds are normal.  Genitourinary: Vagina normal and uterus normal.  Musculoskeletal: Normal range of motion.  Neurological: She is alert and oriented to person, place, and time. She has normal reflexes.  Skin: Skin is warm and dry.  Psychiatric: She has a normal mood and affect. Her behavior is normal. Judgment and thought content normal.    MAU Course  Procedures  MDM Common discomforts of pregnancy  Assessment and Plan  Not in labor. SVE cervix firm/cl/post/high  LAWSON, MARIE DARLENE 03/04/2014, 10:22 AM

## 2014-03-04 NOTE — Discharge Instructions (Signed)
Abdominal Pain During Pregnancy Belly (abdominal) pain is common during pregnancy. Most of the time, it is not a serious problem. Other times, it can be a sign that something is wrong with the pregnancy. Always tell your doctor if you have belly pain. HOME CARE Monitor your belly pain for any changes. The following actions may help you feel better:  Do not have sex (intercourse) or put anything in your vagina until you feel better.  Rest until your pain stops.  Drink clear fluids if you feel sick to your stomach (nauseous). Do not eat solid food until you feel better.  Only take medicine as told by your doctor.  Keep all doctor visits as told. GET HELP RIGHT AWAY IF:   You are bleeding, leaking fluid, or pieces of tissue come out of your vagina.  You have more pain or cramping.  You keep throwing up (vomiting).  You have pain when you pee (urinate) or have blood in your pee.  You have a fever.  You do not feel your baby moving as much.  You feel very weak or feel like passing out.  You have trouble breathing, with or without belly pain.  You have a very bad headache and belly pain.  You have fluid leaking from your vagina and belly pain.  You keep having watery poop (diarrhea).  Your belly pain does not go away after resting, or the pain gets worse. MAKE SURE YOU:   Understand these instructions.  Will watch your condition.  Will get help right away if you are not doing well or get worse. Document Released: 02/04/2009 Document Revised: 10/19/2012 Document Reviewed: 09/15/2012 [facility] Patient Information 2015 [place], [place]. This information is not intended to replace advice given to you by your health care provider. Make sure you discuss any questions you have with your health care provider. Sciatica Sciatica is pain, weakness, numbness, or tingling along the path of the sciatic nerve. The nerve starts in the lower back and runs down the back of each leg. The nerve  controls the muscles in the lower leg and in the back of the knee, while also providing sensation to the back of the thigh, lower leg, and the sole of your foot. Sciatica is a symptom of another medical condition. For instance, nerve damage or certain conditions, such as a herniated disk or bone spur on the spine, pinch or put pressure on the sciatic nerve. This causes the pain, weakness, or other sensations normally associated with sciatica. Generally, sciatica only affects one side of the body. CAUSES   Herniated or slipped disc.  Degenerative disk disease.  A pain disorder involving the narrow muscle in the buttocks (piriformis syndrome).  Pelvic injury or fracture.  Pregnancy.  Tumor (rare). SYMPTOMS  Symptoms can vary from mild to very severe. The symptoms usually travel from the low back to the buttocks and down the back of the leg. Symptoms can include:  Mild tingling or dull aches in the lower back, leg, or hip.  Numbness in the back of the calf or sole of the foot.  Burning sensations in the lower back, leg, or hip.  Sharp pains in the lower back, leg, or hip.  Leg weakness.  Severe back pain inhibiting movement. These symptoms may get worse with coughing, sneezing, laughing, or prolonged sitting or standing. Also, being overweight may worsen symptoms. DIAGNOSIS  Your caregiver will perform a physical exam to look for common symptoms of sciatica. He or she may ask you  to do certain movements or activities that would trigger sciatic nerve pain. Other tests may be performed to find the cause of the sciatica. These may include:  Blood tests.  X-rays.  Imaging tests, such as an MRI or CT scan. TREATMENT  Treatment is directed at the cause of the sciatic pain. Sometimes, treatment is not necessary and the pain and discomfort goes away on its own. If treatment is needed, your caregiver may suggest:  Over-the-counter medicines to relieve pain.  Prescription medicines, such  as anti-inflammatory medicine, muscle relaxants, or narcotics.  Applying heat or ice to the painful area.  Steroid injections to lessen pain, irritation, and inflammation around the nerve.  Reducing activity during periods of pain.  Exercising and stretching to strengthen your abdomen and improve flexibility of your spine. Your caregiver may suggest losing weight if the extra weight makes the back pain worse.  Physical therapy.  Surgery to eliminate what is pressing or pinching the nerve, such as a bone spur or part of a herniated disk. HOME CARE INSTRUCTIONS   Only take over-the-counter or prescription medicines for pain or discomfort as directed by your caregiver.  Apply ice to the affected area for 20 minutes, 3-4 times a day for the first 48-72 hours. Then try heat in the same way.  Exercise, stretch, or perform your usual activities if these do not aggravate your pain.  Attend physical therapy sessions as directed by your caregiver.  Keep all follow-up appointments as directed by your caregiver.  Do not wear high heels or shoes that do not provide proper support.  Check your mattress to see if it is too soft. A firm mattress may lessen your pain and discomfort. SEEK IMMEDIATE MEDICAL CARE IF:   You lose control of your bowel or bladder (incontinence).  You have increasing weakness in the lower back, pelvis, buttocks, or legs.  You have redness or swelling of your back.  You have a burning sensation when you urinate.  You have pain that gets worse when you lie down or awakens you at night.  Your pain is worse than you have experienced in the past.  Your pain is lasting longer than 4 weeks.  You are suddenly losing weight without reason. MAKE SURE YOU:  Understand these instructions.  Will watch your condition.  Will get help right away if you are not doing well or get worse. Document Released: 02/10/2001 Document Revised: 08/18/2011 Document Reviewed:  06/28/2011 [facility] Patient Information 2015 [place], [place]. This information is not intended to replace advice given to you by your health care provider. Make sure you discuss any questions you have with your health care provider.

## 2014-03-05 ENCOUNTER — Telehealth: Payer: Self-pay | Admitting: Medical

## 2014-03-05 ENCOUNTER — Inpatient Hospital Stay (HOSPITAL_COMMUNITY)
Admission: AD | Admit: 2014-03-05 | Discharge: 2014-03-05 | Disposition: A | Discharge status: Home / Self Care | Payer: Medicaid Other | Source: Ambulatory Visit | Attending: Obstetrics and Gynecology | Admitting: Obstetrics and Gynecology

## 2014-03-05 ENCOUNTER — Encounter: Payer: Medicaid Other | Admitting: Family Medicine

## 2014-03-05 DIAGNOSIS — N949 Unspecified condition associated with female genital organs and menstrual cycle: Secondary | ICD-10-CM | POA: Insufficient documentation

## 2014-03-05 DIAGNOSIS — R109 Unspecified abdominal pain: Secondary | ICD-10-CM | POA: Insufficient documentation

## 2014-03-05 DIAGNOSIS — Z3A22 22 weeks gestation of pregnancy: Secondary | ICD-10-CM | POA: Diagnosis not present

## 2014-03-05 DIAGNOSIS — O9989 Other specified diseases and conditions complicating pregnancy, childbirth and the puerperium: Secondary | ICD-10-CM | POA: Diagnosis not present

## 2014-03-05 MED ORDER — CYCLOBENZAPRINE HCL 5 MG PO TABS: 5.0000 mg | ORAL_TABLET | Freq: Three times a day (TID) | ORAL | Status: DC | PRN

## 2014-03-05 NOTE — MAU Note (Signed)
E signature not working in room; pt signed printed copy of AVS.

## 2014-03-05 NOTE — MAU Note (Signed)
Urine in lab 

## 2014-03-05 NOTE — MAU Note (Addendum)
Was here yesterday for cramping.  Used heating pad all night, on abd.  Stressed to pt NOT to use it on preg abd- esp prolonged like that. Still having pain.  Has been having pain for 2 wks.

## 2014-03-05 NOTE — Telephone Encounter (Signed)
Patient called this morning to say she has been experiencing some pain. She stated she is pregnant with twins, and has been hurting all weekend. She has been going to MAU, but states they aren't doing anything for her pain. I made her an appointment for today, but she canceled the appointment due to not being able to get off. Patient called back, and stated she needed a sooner appointment. She is in MAU now, and wants to be seen in the office because they not doing a thing for her. Consulted with Almyra Free PA as to what she would get done in the MAU that we would do differently in the office. Almyra Free PA stated if she was not contracting, bleeding, or cervix is closed, they would probably just tell her to keep her appointment she has next week. When I went back to the phone she had hung up. I called her back, but she hung up on me.

## 2014-03-05 NOTE — MAU Provider Note (Signed)
Chief Complaint:  Abdominal Cramping   First Provider Initiated Contact with Patient 03/05/14 1726      HPI: Sherri [name] is a 29 y.o. G1P0 at [redacted]w[redacted]d who presents to maternity admissions reporting abdominal pain.  She has been seen various times in MAU for abdominal pain, most recently day prior. Has been determined pain is likely from round ligament pain vs fibroid and not due to PTL.   Pt reports constant pain in lower abdomen along "hips and into pelvis" for past 2-3 weeks. Has been prescribed tramadol and percocet for the pain with no improvement. Also take tylenol 650 mg ~ 1x/day for pain without improvement. States that she tried a heating pad last night and this has been the only thing that has helped pain.  Does have mono-di twin pregnancy, otherwise uncomplicated, but notices as "belly gets bigger, pain gets worse." Pain worse w/ laying on side, repositioning (sitting to standing), and after being on feet for long time at work.  Denies contractions, leakage of fluid or vaginal bleeding. Good fetal movement.   Pregnancy Course:  Uterine fibroid Mono-Di twin pregnancy Rh neg  Past Medical History: Past Medical History  Diagnosis Date  . Uterine fibroid     Past obstetric history: OB History  Gravida Para Term Preterm AB SAB TAB Ectopic Multiple Living  1             # Outcome Date GA Lbr Len/2nd Weight Sex Delivery Anes PTL Lv  1 Current               Past Surgical History: Past Surgical History  Procedure Laterality Date  . No past surgeries       Family History: No family history on file.  Social History: History  Substance Use Topics  . Smoking status: Never Smoker   . Smokeless tobacco: Never Used  . Alcohol Use: No     Comment: occasional    Allergies: No Known Allergies  Meds:  Prescriptions prior to admission  Medication Sig Dispense Refill Last Dose  . acetaminophen (TYLENOL) 325 MG tablet Take 650 mg by mouth every 6 (six) hours as needed  for mild pain or headache.    Past Week at Unknown time  . oxyCODONE-acetaminophen (PERCOCET) 7.5-325 MG per tablet Take 1 tablet by mouth every 4 (four) hours as needed for pain. 30 tablet 0 Past Week at Unknown time  . Prenatal Vit-Fe Fumarate-FA (PRENATAL MULTIVITAMIN) TABS tablet Take 1 tablet by mouth at bedtime.    03/04/2014 at Unknown time  . promethazine (PHENERGAN) 25 MG tablet Take 1 tablet (25 mg total) by mouth every 6 (six) hours as needed for nausea or vomiting. 30 tablet 1 Past Week at Unknown time  . silver sulfADIAZINE (SILVADENE) 1 % cream Apply 1 application topically daily. 50 g 0 Past Week at Unknown time    ROS: Pertinent findings in history of present illness.  Physical Exam  Blood pressure 131/71, pulse 108, temperature 98.4 F (36.9 C), temperature source Oral, resp. rate 20, height 4\' 10"  (1.473 m), weight 170 lb (77.111 kg), last menstrual period 10/03/2013. GENERAL: Well-developed, well-nourished female in no acute distress.  HEENT: normocephalic HEART: normal rate RESP: normal effort ABDOMEN: Soft,  Mildly tender along round ligament and over R uterine fibroid, gravid appropriate for gestational age EXTREMITIES: Nontender, no edema NEURO: alert and oriented Dilation: Closed Effacement (%): Thick Cervical Position: Posterior Station: -3 Exam by:: Kali Ambler  FHT:   Doppler: A - 140 bpm  B - 152 bpm Contractions: none   Labs: No results found for this or any previous visit (from the past 24 hour(s)).  Imaging:  US Ob Limited  02/20/2014   OBSTETRICAL ULTRASOUND: This exam was performed within a [place] [facility]. The OB US report was generated in the AS system, and faxed to the ordering physician.   This report is available in the [facility]. See the AS Obstetric US report via the Image Link.  US Ob Limited  02/17/2014   OBSTETRICAL ULTRASOUND: This exam was performed within a [place] [facility]. The OB US report was  generated in the AS system, and faxed to the ordering physician.   This report is available in the [facility]. See the AS Obstetric US report via the Image Link.  US Ob Follow Up  03/03/2014   OBSTETRICAL ULTRASOUND: This exam was performed within a [place] [facility] Department. The OB US report was generated in the AS system, and faxed to the ordering physician.   This report is available in the [facility]. See the AS Obstetric US report via the Image Link.  US Ob Follow Up Addl Gest  03/03/2014   OBSTETRICAL ULTRASOUND: This exam was performed within a [place] [facility] Department. The OB US report was generated in the AS system, and faxed to the ordering physician.   This report is available in the [facility]. See the AS Obstetric US report via the Image Link.  [place] [facility] Ob Transvaginal  03/01/2014   OBSTETRICAL ULTRASOUND: This exam was performed within a [place] [facility] Department. The OB US report was generated in the AS system, and faxed to the ordering physician.   This report is available in the [facility]. See the AS Obstetric US report via the Image Link.  MAU Course:   Assessment: 1. Round ligament pain    Pt with likely round ligament pain, seen multiple times in MAU and deemed not in active labor. SVE showed cervical exam unchanged (c/t/h) and toco w/o contractions. PE and hx c/w round ligament pain  Plan:  Trial of flexeril for pain Discussed tylenol scheduled Discuseed use of heat, baths Discussed other mechanical support such as sleeping with pillow under abdomen and belly band to support gravid abdomen Provided work note to advise for frequent breaks and to use chair to rest when able Discharge home Preterm labor precautions      Medication List    STOP taking these medications        oxyCODONE-acetaminophen 7.5-325 MG per tablet  Commonly known as:  PERCOCET      TAKE these medications        acetaminophen 325 MG tablet  Commonly known as:   TYLENOL  Take 650 mg by mouth every 6 (six) hours as needed for mild pain or headache.     cyclobenzaprine 5 MG tablet  Commonly known as:  FLEXERIL  Take 1 tablet (5 mg total) by mouth 3 (three) times daily as needed for muscle spasms.     prenatal multivitamin Tabs tablet  Take 1 tablet by mouth at bedtime.     promethazine 25 MG tablet  Commonly known as:  PHENERGAN  Take 1 tablet (25 mg total) by mouth every 6 (six) hours as needed for nausea or vomiting.     silver sulfADIAZINE 1 % cream  Commonly known as:  SILVADENE  Apply 1 application topically daily.        Josephine Cables, MD  03/05/2014 5:57 PM

## 2014-03-07 ENCOUNTER — Encounter (HOSPITAL_COMMUNITY): Payer: Self-pay | Admitting: *Deleted

## 2014-03-07 ENCOUNTER — Inpatient Hospital Stay (HOSPITAL_COMMUNITY)
Admission: AD | Admit: 2014-03-07 | Discharge: 2014-03-07 | Disposition: A | Discharge status: Home / Self Care | Payer: Medicaid Other | Source: Ambulatory Visit | Attending: Family Medicine | Admitting: Family Medicine

## 2014-03-07 DIAGNOSIS — K5901 Slow transit constipation: Secondary | ICD-10-CM

## 2014-03-07 DIAGNOSIS — O30032 Twin pregnancy, monochorionic/diamniotic, second trimester: Secondary | ICD-10-CM | POA: Diagnosis not present

## 2014-03-07 DIAGNOSIS — N949 Unspecified condition associated with female genital organs and menstrual cycle: Secondary | ICD-10-CM | POA: Insufficient documentation

## 2014-03-07 DIAGNOSIS — R109 Unspecified abdominal pain: Secondary | ICD-10-CM | POA: Insufficient documentation

## 2014-03-07 DIAGNOSIS — O9989 Other specified diseases and conditions complicating pregnancy, childbirth and the puerperium: Secondary | ICD-10-CM | POA: Diagnosis not present

## 2014-03-07 DIAGNOSIS — Z3A22 22 weeks gestation of pregnancy: Secondary | ICD-10-CM | POA: Diagnosis not present

## 2014-03-07 LAB — URINALYSIS, ROUTINE W REFLEX MICROSCOPIC
Bilirubin Urine: NEGATIVE
Glucose, UA: NEGATIVE mg/dL
Hgb urine dipstick: NEGATIVE
Ketones, ur: 15 mg/dL — AB
Leukocytes, UA: NEGATIVE
Nitrite: NEGATIVE
Protein, ur: NEGATIVE mg/dL
Specific Gravity, Urine: >1.03 — ABNORMAL HIGH (ref 1.005–1.030)
Urobilinogen, UA: 1 mg/dL (ref 0.0–1.0)
pH: 6 (ref 5.0–8.0)

## 2014-03-07 MED ORDER — POLYETHYLENE GLYCOL 3350 17 G PO PACK
17.0000 g | PACK | Freq: Once | ORAL | Status: AC
  Administered 2014-03-07: 17 g via ORAL
  Filled 2014-03-07: qty 1

## 2014-03-07 MED ORDER — BISACODYL 10 MG RE SUPP: 10.0000 mg | RECTAL | Status: DC | PRN

## 2014-03-07 MED ORDER — POLYETHYLENE GLYCOL 3350 17 G PO PACK
17.0000 g | PACK | Freq: Every day | ORAL | Status: DC
Start: 2014-03-07 — End: 2014-11-02

## 2014-03-07 NOTE — MAU Note (Addendum)
Having a lot of abd pain, feels tight. Hasn't had a bm in over 3 days, usually goes at least once a day

## 2014-03-07 NOTE — MAU Provider Note (Signed)
  History   Patient is a G1P0, @[redacted]w[redacted]d  (mono;di twins)c/o abdominal "fullness" and "tightness". She states that she hasn't been able to have a BM in a "long time". She is urinating well but w/o BMs. Passing flatus regularly. Pain is deep, and "into my hips". Patient was seen about 48hrs for similar complaints. Given miralax. Patient states this did not help. No other complaints. FHT are not concerning. Denies any VB or symptoms concerning for ROM.  CSN: [id number]  Arrival date and time: 03/07/14 1527   None     Chief Complaint  Patient presents with  . Abdominal Pain   HPI  OB History    Gravida Para Term Preterm AB TAB SAB Ectopic Multiple Living   1               Past Medical History  Diagnosis Date  . Uterine fibroid     Past Surgical History  Procedure Laterality Date  . No past surgeries      History reviewed. No pertinent family history.  History  Substance Use Topics  . Smoking status: Never Smoker   . Smokeless tobacco: Never Used  . Alcohol Use: No     Comment: occasional    Allergies: No Known Allergies  No prescriptions prior to admission    Review of Systems  Constitutional: Negative for fever and chills.  Eyes: Negative for blurred vision.  Cardiovascular: Negative for chest pain.  Gastrointestinal: Positive for abdominal pain and constipation. Negative for nausea, vomiting, diarrhea, blood in stool and melena.  Genitourinary: Negative for dysuria.  Skin: Negative for rash.  Neurological: Negative for headaches.   Physical Exam   Blood pressure 138/83, pulse 104, temperature 98 F (36.7 C), temperature source Oral, resp. rate 18, last menstrual period 10/03/2013.  Physical Exam  Constitutional: She is oriented to person, place, and time. She appears well-developed and well-nourished. No distress.  HENT:  Head: Normocephalic and atraumatic.  Eyes: EOM are normal. Pupils are equal, round, and reactive to light.  Neck: Normal range of motion.  Neck supple.  Cardiovascular: Normal rate, regular rhythm and normal heart sounds.   Respiratory: Effort normal and breath sounds normal. No respiratory distress.  GI: Soft. There is tenderness.  Genitourinary: Uterus normal.  Neurological: She is alert and oriented to person, place, and time.  Skin: She is not diaphoretic.    MAU Course  Procedures  MDM Patient is complaining of abdom/pelvic pain. Likely 2/2 constipation. Reports significant decrease in BMs over the past 2-3 weeks. Ddx also includes braxton hicks contractions, preterm labor, and uterine rupture. Patient reports significant urge to have BM w/o the ability to pass stool.   - providing dulcolax suppository x12 tabs to be taken until BM is obtained - miralax instructed to take daily until symptoms resolve.  Assessment and Plan  See above.  Elberta Leatherwood 03/07/2014, 6:41 PM   This patient was also seen and examined by me.  Agree with above note. Seabron Spates, CNM

## 2014-03-07 NOTE — MAU Note (Signed)
Pt states her stomach is real tight and she has not been going  To the bathroom.Pt states her stomach is real tight."I'm able to pass water but I have not been able to"go to the bathroom " Pt states it has been about 3 days

## 2014-03-07 NOTE — MAU Note (Signed)
Pt given choice of having choice of juice type to mix Miralax  In pt chose cranberry. Pt completed juice at bedside

## 2014-03-07 NOTE — Discharge Instructions (Signed)

## 2014-03-11 ENCOUNTER — Inpatient Hospital Stay (HOSPITAL_COMMUNITY)
Admission: AD | Admit: 2014-03-11 | Discharge: 2014-03-11 | Disposition: A | Discharge status: Home / Self Care | Payer: Medicaid Other | Source: Ambulatory Visit | Attending: Obstetrics & Gynecology | Admitting: Obstetrics & Gynecology

## 2014-03-11 DIAGNOSIS — O212 Late vomiting of pregnancy: Secondary | ICD-10-CM | POA: Insufficient documentation

## 2014-03-11 DIAGNOSIS — Z3A23 23 weeks gestation of pregnancy: Secondary | ICD-10-CM

## 2014-03-11 DIAGNOSIS — O30002 Twin pregnancy, unspecified number of placenta and unspecified number of amniotic sacs, second trimester: Secondary | ICD-10-CM

## 2014-03-11 DIAGNOSIS — O218 Other vomiting complicating pregnancy: Secondary | ICD-10-CM

## 2014-03-11 LAB — URINALYSIS, ROUTINE W REFLEX MICROSCOPIC
Bilirubin Urine: NEGATIVE
Glucose, UA: 100 mg/dL — AB
Hgb urine dipstick: NEGATIVE
Ketones, ur: NEGATIVE mg/dL
Leukocytes, UA: NEGATIVE
Nitrite: NEGATIVE
Protein, ur: NEGATIVE mg/dL
Specific Gravity, Urine: 1.01 (ref 1.005–1.030)
Urobilinogen, UA: 0.2 mg/dL (ref 0.0–1.0)
pH: 6 (ref 5.0–8.0)

## 2014-03-11 MED ORDER — SCOPOLAMINE 1 MG/3DAYS TD PT72: 1.0000 | MEDICATED_PATCH | TRANSDERMAL | Status: DC

## 2014-03-11 MED ORDER — SCOPOLAMINE 1 MG/3DAYS TD PT72
1.0000 | MEDICATED_PATCH | TRANSDERMAL | Status: DC
  Administered 2014-03-11: 1.5 mg via TRANSDERMAL
  Filled 2014-03-11: qty 1

## 2014-03-11 MED ORDER — PROMETHAZINE HCL 25 MG/ML IJ SOLN
25.0000 mg | Freq: Once | INTRAMUSCULAR | Status: AC
  Administered 2014-03-11: 25 mg via INTRAMUSCULAR
  Filled 2014-03-11: qty 1

## 2014-03-11 MED ORDER — ONDANSETRON 8 MG PO TBDP: 8.0000 mg | ORAL_TABLET | Freq: Two times a day (BID) | ORAL | Status: DC

## 2014-03-11 MED ORDER — ONDANSETRON HCL 4 MG PO TABS
8.0000 mg | ORAL_TABLET | Freq: Once | ORAL | Status: AC
  Administered 2014-03-11: 8 mg via ORAL
  Filled 2014-03-11: qty 2

## 2014-03-11 NOTE — Progress Notes (Signed)
Notified of pt arrival in MAU and complaint of nausea as well as uterine irritability. Received orders for zofran and fluid trial. Will come see pt

## 2014-03-11 NOTE — MAU Note (Signed)
Pt presents to MAU with complaints of nausea and vomiting, reports that she is taking phenergan but it is not helping. Denies any vaginal bleeding or LOF

## 2014-03-11 NOTE — MAU Note (Signed)
Pt states nausea is slightly improved.

## 2014-03-11 NOTE — Progress Notes (Signed)
Order for IM phenergan given.

## 2014-03-11 NOTE — MAU Note (Signed)
Pt states is unable to keep medicine for nausea down. Felt very light headed today. Was seen recently for same issue. No bleeding or abnormal vaginal discharge

## 2014-03-11 NOTE — MAU Provider Note (Signed)
History   G1 at 23wks with twins in with c/o nausea and vomiting since this morning. Presently taking phenergan for nausea.    CSN: [id number]  Arrival date and time: 03/11/14 1234   None     Chief Complaint  Patient presents with  . Morning Sickness   HPI  OB History    Gravida Para Term Preterm AB TAB SAB Ectopic Multiple Living   1               Past Medical History  Diagnosis Date  . Uterine fibroid     Past Surgical History  Procedure Laterality Date  . No past surgeries      No family history on file.  History  Substance Use Topics  . Smoking status: Never Smoker   . Smokeless tobacco: Never Used  . Alcohol Use: No     Comment: occasional    Allergies: No Known Allergies  Prescriptions prior to admission  Medication Sig Dispense Refill Last Dose  . acetaminophen (TYLENOL) 325 MG tablet Take 650 mg by mouth every 6 (six) hours as needed for mild pain or headache.    Past Week at Unknown time  . bisacodyl (DULCOLAX) 10 MG suppository Place 1 suppository (10 mg total) rectally as needed for moderate constipation. (Patient taking differently: Place 10 mg rectally daily as needed for moderate constipation. ) 12 suppository 0 03/10/2014 at Unknown time  . cyclobenzaprine (FLEXERIL) 5 MG tablet Take 1 tablet (5 mg total) by mouth 3 (three) times daily as needed for muscle spasms. 30 tablet 0 Past Week at Unknown time  . polyethylene glycol (MIRALAX / GLYCOLAX) packet Take 17 g by mouth daily. 14 each 1 03/10/2014 at Unknown time  . Prenatal Vit-Fe Fumarate-FA (PRENATAL MULTIVITAMIN) TABS tablet Take 1 tablet by mouth daily.    03/10/2014 at Unknown time  . promethazine (PHENERGAN) 25 MG tablet Take 1 tablet (25 mg total) by mouth every 6 (six) hours as needed for nausea or vomiting. 30 tablet 1 03/11/2014 at Unknown time  . silver sulfADIAZINE (SILVADENE) 1 % cream Apply 1 application topically daily. 50 g 0 Past Week at Unknown time    Review of Systems   Constitutional: Negative.   HENT: Negative.   Eyes: Negative.   Respiratory: Negative.   Cardiovascular: Negative.   Gastrointestinal: Positive for nausea and vomiting.  Genitourinary: Negative.   Musculoskeletal: Negative.   Skin: Negative.   Neurological: Negative.   Endo/Heme/Allergies: Negative.   Psychiatric/Behavioral: Negative.    Physical Exam   Blood pressure 133/74, pulse 118, temperature 98.7 F (37.1 C), resp. rate 18, height 4\' 10"  (1.473 m), weight 170 lb (77.111 kg), last menstrual period 10/03/2013.  Physical Exam  Constitutional: She is oriented to person, place, and time. She appears well-developed and well-nourished.  HENT:  Head: Normocephalic.  Neck: Normal range of motion.  Cardiovascular: Normal rate, regular rhythm, normal heart sounds and intact distal pulses.   Respiratory: Effort normal and breath sounds normal.  GI: Soft. Bowel sounds are normal.  Genitourinary: Vagina normal and uterus normal.  Musculoskeletal: Normal range of motion.  Neurological: She is alert and oriented to person, place, and time. She has normal reflexes.  Skin: Skin is warm and dry.  Psychiatric: She has a normal mood and affect. Her behavior is normal. Judgment and thought content normal.    MAU Course  Procedures  MDM Nausea and vomiting of pregnancy  Assessment and Plan  Twin preg at 23.3 nausea and vomiting.  Will start zofran and transderm patch Follow up as needed with OB provider or in MAU for acute care  Koren Shiver DARLENE 03/11/2014, 2:24 PM   Attestation of Attending Supervision of Advanced Practitioner (PA/CNM/NP): Evaluation and management procedures were performed by the Advanced Practitioner under my supervision and collaboration.  I have reviewed the Advanced Practitioner's note and chart, and I agree with the management and plan.  Verita Schneiders, MD, [place] Attending [place], [facility]

## 2014-03-11 NOTE — Discharge Instructions (Signed)
Hyperemesis Gravidarum Hyperemesis gravidarum is a severe form of nausea and vomiting that happens during pregnancy. Hyperemesis is worse than morning sickness. It may cause you to have nausea or vomiting all day for many days. It may keep you from eating and drinking enough food and liquids. Hyperemesis usually occurs during the first half (the first 20 weeks) of pregnancy. It often goes away once a woman is in her second half of pregnancy. However, sometimes hyperemesis continues through an entire pregnancy.  CAUSES  The cause of this condition is not completely known but is thought to be related to changes in the body's hormones when pregnant. It could be from the high level of the pregnancy hormone or an increase in estrogen in the body.  SIGNS AND SYMPTOMS   Severe nausea and vomiting.  Nausea that does not go away.  Vomiting that does not allow you to keep any food down.  Weight loss and body fluid loss (dehydration).  Having no desire to eat or not liking food you have previously enjoyed. DIAGNOSIS  Your health care provider will do a physical exam and ask you about your symptoms. He or she may also order blood tests and urine tests to make sure something else is not causing the problem.  TREATMENT  You may only need medicine to control the problem. If medicines do not control the nausea and vomiting, you will be treated in the hospital to prevent dehydration, increased acid in the blood (acidosis), weight loss, and changes in the electrolytes in your body that may harm the unborn baby (fetus). You may need IV fluids.  HOME CARE INSTRUCTIONS   Only take over-the-counter or prescription medicines as directed by your health care provider.  Try eating a couple of dry crackers or toast in the morning before getting out of bed.  Avoid foods and smells that upset your stomach.  Avoid fatty and spicy foods.  Eat 5-6 small meals a day.  Do not drink when eating meals. Drink between  meals.  For snacks, eat high-protein foods, such as cheese.  Eat or suck on things that have ginger in them. Ginger helps nausea.  Avoid food preparation. The smell of food can spoil your appetite.  Avoid iron pills and iron in your multivitamins until after 3-4 months of being pregnant. However, consult with your health care provider before stopping any prescribed iron pills. SEEK MEDICAL CARE IF:   Your abdominal pain increases.  You have a severe headache.  You have vision problems.  You are losing weight. SEEK IMMEDIATE MEDICAL CARE IF:   You are unable to keep fluids down.  You vomit blood.  You have constant nausea and vomiting.  You have excessive weakness.  You have extreme thirst.  You have dizziness or fainting.  You have a fever or persistent symptoms for more than 2-3 days.  You have a fever and your symptoms suddenly get worse. MAKE SURE YOU:   Understand these instructions.  Will watch your condition.  Will get help right away if you are not doing well or get worse. Document Released: 02/16/2005 Document Revised: 12/07/2012 Document Reviewed: 09/28/2012 [facility] Patient Information 2015 [place], [place]. This information is not intended to replace advice given to you by your health care provider. Make sure you discuss any questions you have with your health care provider. Second Trimester of Pregnancy The second trimester is from week 13 through week 28, month 4 through 6. This is often the time in pregnancy that you  feel your best. Often times, morning sickness has lessened or quit. You may have more energy, and you may get hungry more often. Your unborn baby (fetus) is growing rapidly. At the end of the sixth month, he or she is about 9 inches long and weighs about 1 pounds. You will likely feel the baby move (quickening) between 18 and 20 weeks of pregnancy. HOME CARE   Avoid all smoking, herbs, and alcohol. Avoid drugs not approved by your  doctor.  Only take medicine as told by your doctor. Some medicines are safe and some are not during pregnancy.  Exercise only as told by your doctor. Stop exercising if you start having cramps.  Eat regular, healthy meals.  Wear a good support bra if your breasts are tender.  Do not use hot tubs, steam rooms, or saunas.  Wear your seat belt when driving.  Avoid raw meat, uncooked cheese, and liter boxes and soil used by cats.  Take your prenatal vitamins.  Try taking medicine that helps you poop (stool softener) as needed, and if your doctor approves. Eat more fiber by eating fresh fruit, vegetables, and whole grains. Drink enough fluids to keep your pee (urine) clear or pale yellow.  Take warm water baths (sitz baths) to soothe pain or discomfort caused by hemorrhoids. Use hemorrhoid cream if your doctor approves.  If you have puffy, bulging veins (varicose veins), wear support hose. Raise (elevate) your feet for 15 minutes, 3-4 times a day. Limit salt in your diet.  Avoid heavy lifting, wear low heals, and sit up straight.  Rest with your legs raised if you have leg cramps or low back pain.  Visit your dentist if you have not gone during your pregnancy. Use a soft toothbrush to brush your teeth. Be gentle when you floss.  You can have sex (intercourse) unless your doctor tells you not to.  Go to your doctor visits. GET HELP IF:   You feel dizzy.  You have mild cramps or pressure in your lower belly (abdomen).  You have a nagging pain in your belly area.  You continue to feel sick to your stomach (nauseous), throw up (vomit), or have watery poop (diarrhea).  You have bad smelling fluid coming from your vagina.  You have pain with peeing (urination). GET HELP RIGHT AWAY IF:   You have a fever.  You are leaking fluid from your vagina.  You have spotting or bleeding from your vagina.  You have severe belly cramping or pain.  You lose or gain weight  rapidly.  You have trouble catching your breath and have chest pain.  You notice sudden or extreme puffiness (swelling) of your face, hands, ankles, feet, or legs.  You have not felt the baby move in over an hour.  You have severe headaches that do not go away with medicine.  You have vision changes. Document Released: 05/13/2009 Document Revised: 06/13/2012 Document Reviewed: 04/19/2012 [facility] Patient Information 2015 [place], [place]. This information is not intended to replace advice given to you by your health care provider. Make sure you discuss any questions you have with your health care provider.

## 2014-03-13 ENCOUNTER — Ambulatory Visit (INDEPENDENT_AMBULATORY_CARE_PROVIDER_SITE_OTHER): Payer: Medicaid Other | Admitting: Obstetrics and Gynecology

## 2014-03-13 ENCOUNTER — Encounter: Payer: Self-pay | Admitting: Obstetrics and Gynecology

## 2014-03-13 VITALS — BP 134/80 | HR 90 | Temp 98.1°F | Wt 172.0 lb

## 2014-03-13 DIAGNOSIS — O360122 Maternal care for anti-D [Rh] antibodies, second trimester, fetus 2: Secondary | ICD-10-CM

## 2014-03-13 DIAGNOSIS — O30032 Twin pregnancy, monochorionic/diamniotic, second trimester: Secondary | ICD-10-CM

## 2014-03-13 DIAGNOSIS — D259 Leiomyoma of uterus, unspecified: Secondary | ICD-10-CM

## 2014-03-13 LAB — POCT URINALYSIS DIP (DEVICE)
Bilirubin Urine: NEGATIVE
Glucose, UA: NEGATIVE mg/dL
Hgb urine dipstick: NEGATIVE
Ketones, ur: NEGATIVE mg/dL
Leukocytes, UA: NEGATIVE
Nitrite: NEGATIVE
Protein, ur: NEGATIVE mg/dL
Specific Gravity, Urine: 1.015 (ref 1.005–1.030)
Urobilinogen, UA: 0.2 mg/dL (ref 0.0–1.0)
pH: 6.5 (ref 5.0–8.0)

## 2014-03-13 NOTE — Progress Notes (Signed)
Patient is doing well, still with nausea issues. Discussed taking zofran q 8 hours and phenergan prn. F/u growth ultrasound 1/14. FM/PTL precautions reviewed

## 2014-03-15 ENCOUNTER — Encounter: Payer: Self-pay | Admitting: Family

## 2014-03-15 ENCOUNTER — Other Ambulatory Visit: Payer: Self-pay | Admitting: Family

## 2014-03-15 ENCOUNTER — Ambulatory Visit (HOSPITAL_COMMUNITY)
Admission: RE | Admit: 2014-03-15 | Discharge: 2014-03-15 | Disposition: A | Discharge status: Home / Self Care | Payer: Medicaid Other | Source: Ambulatory Visit | Attending: Family Medicine | Admitting: Family Medicine

## 2014-03-15 DIAGNOSIS — Z3A24 24 weeks gestation of pregnancy: Secondary | ICD-10-CM | POA: Insufficient documentation

## 2014-03-15 DIAGNOSIS — O30032 Twin pregnancy, monochorionic/diamniotic, second trimester: Secondary | ICD-10-CM | POA: Insufficient documentation

## 2014-03-15 DIAGNOSIS — O3412 Maternal care for benign tumor of corpus uteri, second trimester: Secondary | ICD-10-CM | POA: Insufficient documentation

## 2014-03-15 DIAGNOSIS — IMO0001 Reserved for inherently not codable concepts without codable children: Secondary | ICD-10-CM

## 2014-03-15 DIAGNOSIS — O283 Abnormal ultrasonic finding on antenatal screening of mother: Secondary | ICD-10-CM | POA: Insufficient documentation

## 2014-03-15 DIAGNOSIS — D259 Leiomyoma of uterus, unspecified: Secondary | ICD-10-CM | POA: Diagnosis not present

## 2014-03-15 DIAGNOSIS — O4100X0 Oligohydramnios, unspecified trimester, not applicable or unspecified: Secondary | ICD-10-CM | POA: Insufficient documentation

## 2014-03-19 ENCOUNTER — Other Ambulatory Visit (HOSPITAL_COMMUNITY): Payer: Self-pay | Admitting: Maternal and Fetal Medicine

## 2014-03-19 DIAGNOSIS — IMO0001 Reserved for inherently not codable concepts without codable children: Secondary | ICD-10-CM

## 2014-03-20 ENCOUNTER — Ambulatory Visit (HOSPITAL_COMMUNITY)
Admission: RE | Admit: 2014-03-20 | Discharge: 2014-03-20 | Disposition: A | Discharge status: Home / Self Care | Payer: Medicaid Other | Source: Ambulatory Visit | Attending: Family Medicine | Admitting: Family Medicine

## 2014-03-20 ENCOUNTER — Other Ambulatory Visit (HOSPITAL_COMMUNITY): Payer: Self-pay

## 2014-03-20 DIAGNOSIS — Z36 Encounter for antenatal screening of mother: Secondary | ICD-10-CM | POA: Insufficient documentation

## 2014-03-20 DIAGNOSIS — Z3A24 24 weeks gestation of pregnancy: Secondary | ICD-10-CM | POA: Insufficient documentation

## 2014-03-20 DIAGNOSIS — IMO0001 Reserved for inherently not codable concepts without codable children: Secondary | ICD-10-CM

## 2014-03-20 DIAGNOSIS — O30032 Twin pregnancy, monochorionic/diamniotic, second trimester: Secondary | ICD-10-CM | POA: Insufficient documentation

## 2014-03-22 ENCOUNTER — Encounter (HOSPITAL_COMMUNITY): Payer: Self-pay | Admitting: *Deleted

## 2014-03-22 ENCOUNTER — Inpatient Hospital Stay (HOSPITAL_COMMUNITY)
Admission: AD | Admit: 2014-03-22 | Discharge: 2014-03-22 | Disposition: A | Discharge status: Home / Self Care | Payer: Medicaid Other | Source: Ambulatory Visit | Attending: Obstetrics & Gynecology | Admitting: Obstetrics & Gynecology

## 2014-03-22 ENCOUNTER — Ambulatory Visit (HOSPITAL_COMMUNITY)
Admission: RE | Admit: 2014-03-22 | Discharge: 2014-03-22 | Disposition: A | Discharge status: Home / Self Care | Payer: Medicaid Other | Source: Ambulatory Visit | Attending: Obstetrics and Gynecology | Admitting: Obstetrics and Gynecology

## 2014-03-22 DIAGNOSIS — O30002 Twin pregnancy, unspecified number of placenta and unspecified number of amniotic sacs, second trimester: Secondary | ICD-10-CM | POA: Diagnosis not present

## 2014-03-22 DIAGNOSIS — O9989 Other specified diseases and conditions complicating pregnancy, childbirth and the puerperium: Secondary | ICD-10-CM | POA: Insufficient documentation

## 2014-03-22 DIAGNOSIS — R109 Unspecified abdominal pain: Secondary | ICD-10-CM | POA: Diagnosis not present

## 2014-03-22 DIAGNOSIS — Z3A25 25 weeks gestation of pregnancy: Secondary | ICD-10-CM | POA: Insufficient documentation

## 2014-03-22 LAB — URINALYSIS, ROUTINE W REFLEX MICROSCOPIC
Bilirubin Urine: NEGATIVE
Glucose, UA: NEGATIVE mg/dL
Hgb urine dipstick: NEGATIVE
Ketones, ur: NEGATIVE mg/dL
Leukocytes, UA: NEGATIVE
Nitrite: NEGATIVE
Protein, ur: NEGATIVE mg/dL
Specific Gravity, Urine: 1.015 (ref 1.005–1.030)
Urobilinogen, UA: 0.2 mg/dL (ref 0.0–1.0)
pH: 7 (ref 5.0–8.0)

## 2014-03-22 MED ORDER — BETAMETHASONE SOD PHOS & ACET 6 (3-3) MG/ML IJ SUSP
12.0000 mg | Freq: Once | INTRAMUSCULAR | Status: AC
  Administered 2014-03-22: 12 mg via INTRAMUSCULAR
  Filled 2014-03-22: qty 2

## 2014-03-22 MED ORDER — ACETAMINOPHEN-CODEINE 300-30 MG PO TABS: 1.0000 | ORAL_TABLET | Freq: Four times a day (QID) | ORAL | Status: DC | PRN

## 2014-03-22 NOTE — Progress Notes (Signed)
Upon discharge patient report increased cramping and lower pelvic pain after cervical check.  Pain is described as "cramping".  Lower back pain continuing.  Consulted with Dr. Roselie Awkward > discussed use of belly band and temporary use of Tylenol#3.    RX Tylenol#3 and Belly Band.  Keep scheduled follow-up appointment.  Venia Carbon Michiel Cowboy, CNM

## 2014-03-22 NOTE — MAU Provider Note (Signed)
History     CSN: [id number]  Arrival date and time: 03/22/14 1056   None     Chief Complaint  Patient presents with  . Abdominal Pain   HPI 29 yo G1P0 at [redacted]w[redacted]d presents with abdominal pain x weeks. Was previously seen in MAU for the same and given trial of flexeril. Has been taking flexeril and not helping. Denies constipation, N/V.   Denies LOF, bleeding, decreased fetal activity, or contractions.    Past Medical History  Diagnosis Date  . Uterine fibroid     Past Surgical History  Procedure Laterality Date  . No past surgeries      History reviewed. No pertinent family history.  History  Substance Use Topics  . Smoking status: Never Smoker   . Smokeless tobacco: Never Used  . Alcohol Use: No     Comment: occasional    Allergies: No Known Allergies  Prescriptions prior to admission  Medication Sig Dispense Refill Last Dose  . acetaminophen (TYLENOL) 325 MG tablet Take 650 mg by mouth every 6 (six) hours as needed for mild pain or headache.    Past Week at Unknown time  . bisacodyl (DULCOLAX) 10 MG suppository Place 10 mg rectally as needed for mild constipation or moderate constipation.   Past Week at Unknown time  . cyclobenzaprine (FLEXERIL) 5 MG tablet Take 1 tablet (5 mg total) by mouth 3 (three) times daily as needed for muscle spasms. 30 tablet 0 Past Week at Unknown time  . ondansetron (ZOFRAN-ODT) 8 MG disintegrating tablet Take 1 tablet (8 mg total) by mouth every 12 (twelve) hours. 20 tablet 0 03/22/2014 at Unknown time  . polyethylene glycol (MIRALAX / GLYCOLAX) packet Take 17 g by mouth daily. 14 each 1 Past Week at Unknown time  . Prenatal Vit-Fe Fumarate-FA (PRENATAL MULTIVITAMIN) TABS tablet Take 1 tablet by mouth daily.    Past Week at Unknown time  . promethazine (PHENERGAN) 25 MG tablet Take 1 tablet (25 mg total) by mouth every 6 (six) hours as needed for nausea or vomiting. 30 tablet 1 Past Week at Unknown time  . scopolamine (TRANSDERM-SCOP) 1  MG/3DAYS Place 1 patch (1.5 mg total) onto the skin every 3 (three) days. 10 patch 12 Past Week at Unknown time  . bisacodyl (DULCOLAX) 10 MG suppository Place 1 suppository (10 mg total) rectally as needed for moderate constipation. (Patient not taking: Reported on 03/22/2014) 12 suppository 0 Taking    Review of Systems  Gastrointestinal: Positive for abdominal pain. Negative for nausea, vomiting, diarrhea and constipation.  Genitourinary: Negative for dysuria, urgency, frequency, hematuria and flank pain.  Musculoskeletal: Positive for back pain.   Physical Exam   Blood pressure 143/63, pulse 81, temperature 98.4 F (36.9 C), temperature source Oral, resp. rate 18, height 4\' 10"  (1.473 m), weight 77.656 kg (171 lb 3.2 oz), last menstrual period 10/03/2013.  Physical Exam  Constitutional: She is oriented to person, place, and time. She appears well-developed and well-nourished.  HENT:  Head: Normocephalic and atraumatic.  Cardiovascular: Normal rate, regular rhythm and normal heart sounds.   Respiratory: Effort normal and breath sounds normal.  Genitourinary:  Cervical exam: fingertip at external os and internal os closed  Musculoskeletal:  Bilateral tenderness to palpation of T7-T10 with bilateral msk tightness. Pain is reproducible to palpation.  Neurological: She is alert and oriented to person, place, and time.  Skin: Skin is warm and dry.    MAU Course  Procedures  MDM NST reassuring, no ctx noted  Assessment and Plan  29 yo female G1P0 at [redacted]w[redacted]d presents with abdominal pain/tightness.  # abdominal pain/tightness-  seen multiple times in MAU and deemed not in active labor. PE consistent with msk back pain in setting of pregnancy. Also consider braxton hicks contractions. preterm labor is very low of ddx due to cervical exam findings.  # dispo- discharge home with regular follow up.   Manus Rudd, Shayda Kalka 03/22/2014, 12:00 PM

## 2014-03-22 NOTE — Discharge Instructions (Signed)
Mother-to-Be Maternity Abdominal Support [facility] [address] [place], [place]  [address] [phone]

## 2014-03-22 NOTE — MAU Note (Signed)
Pt pregnant with twins. Had 1 betamethasone shot today in MFM. C?O abd tightness. Told to come to get checked out.

## 2014-03-23 ENCOUNTER — Ambulatory Visit (HOSPITAL_COMMUNITY): Payer: Medicaid Other

## 2014-03-26 ENCOUNTER — Inpatient Hospital Stay (HOSPITAL_COMMUNITY)
Admission: AD | Admit: 2014-03-26 | Discharge: 2014-03-26 | Disposition: A | Discharge status: Home / Self Care | Payer: Medicaid Other | Source: Ambulatory Visit | Attending: Obstetrics & Gynecology | Admitting: Obstetrics & Gynecology

## 2014-03-26 DIAGNOSIS — Z3A25 25 weeks gestation of pregnancy: Secondary | ICD-10-CM | POA: Diagnosis not present

## 2014-03-26 MED ORDER — BETAMETHASONE SOD PHOS & ACET 6 (3-3) MG/ML IJ SUSP
12.0000 mg | Freq: Once | INTRAMUSCULAR | Status: AC
  Administered 2014-03-26: 12 mg via INTRAMUSCULAR
  Filled 2014-03-26: qty 2

## 2014-03-26 NOTE — MAU Note (Signed)
Here for 2nd dose of metamethasone. Was unable to get out of Friday because of the weather.  No complaints. No bleeding or leaking.  Was able to rest and relax over weekend.

## 2014-03-26 NOTE — MAU Note (Signed)
No complaints offered.  Keep appts as scheduled.

## 2014-03-29 ENCOUNTER — Other Ambulatory Visit (HOSPITAL_COMMUNITY): Payer: Self-pay | Admitting: Maternal and Fetal Medicine

## 2014-03-29 ENCOUNTER — Encounter (HOSPITAL_COMMUNITY): Payer: Self-pay

## 2014-03-29 ENCOUNTER — Ambulatory Visit (HOSPITAL_COMMUNITY)
Admission: RE | Admit: 2014-03-29 | Discharge: 2014-03-29 | Disposition: A | Discharge status: Home / Self Care | Payer: Medicaid Other | Source: Ambulatory Visit | Attending: Obstetrics and Gynecology | Admitting: Obstetrics and Gynecology

## 2014-03-29 DIAGNOSIS — O30032 Twin pregnancy, monochorionic/diamniotic, second trimester: Secondary | ICD-10-CM | POA: Diagnosis present

## 2014-03-29 DIAGNOSIS — D259 Leiomyoma of uterus, unspecified: Secondary | ICD-10-CM | POA: Diagnosis not present

## 2014-03-29 DIAGNOSIS — O3412 Maternal care for benign tumor of corpus uteri, second trimester: Secondary | ICD-10-CM | POA: Diagnosis not present

## 2014-03-29 DIAGNOSIS — IMO0001 Reserved for inherently not codable concepts without codable children: Secondary | ICD-10-CM

## 2014-03-29 DIAGNOSIS — Z3A26 26 weeks gestation of pregnancy: Secondary | ICD-10-CM | POA: Insufficient documentation

## 2014-03-31 ENCOUNTER — Inpatient Hospital Stay (HOSPITAL_COMMUNITY)
Admission: AD | Admit: 2014-03-31 | Discharge: 2014-03-31 | Disposition: A | Discharge status: Home / Self Care | Payer: Medicaid Other | Source: Ambulatory Visit | Attending: Family Medicine | Admitting: Family Medicine

## 2014-03-31 ENCOUNTER — Encounter (HOSPITAL_COMMUNITY): Payer: Self-pay | Admitting: *Deleted

## 2014-03-31 DIAGNOSIS — O99612 Diseases of the digestive system complicating pregnancy, second trimester: Secondary | ICD-10-CM | POA: Diagnosis not present

## 2014-03-31 DIAGNOSIS — O360920 Maternal care for other rhesus isoimmunization, second trimester, not applicable or unspecified: Secondary | ICD-10-CM | POA: Insufficient documentation

## 2014-03-31 DIAGNOSIS — O402XX2 Polyhydramnios, second trimester, fetus 2: Secondary | ICD-10-CM | POA: Insufficient documentation

## 2014-03-31 DIAGNOSIS — Z3A26 26 weeks gestation of pregnancy: Secondary | ICD-10-CM | POA: Diagnosis not present

## 2014-03-31 DIAGNOSIS — O30032 Twin pregnancy, monochorionic/diamniotic, second trimester: Secondary | ICD-10-CM | POA: Diagnosis not present

## 2014-03-31 DIAGNOSIS — K219 Gastro-esophageal reflux disease without esophagitis: Secondary | ICD-10-CM | POA: Diagnosis not present

## 2014-03-31 DIAGNOSIS — D259 Leiomyoma of uterus, unspecified: Secondary | ICD-10-CM | POA: Diagnosis not present

## 2014-03-31 DIAGNOSIS — M549 Dorsalgia, unspecified: Secondary | ICD-10-CM | POA: Insufficient documentation

## 2014-03-31 DIAGNOSIS — O3412 Maternal care for benign tumor of corpus uteri, second trimester: Secondary | ICD-10-CM | POA: Diagnosis not present

## 2014-03-31 DIAGNOSIS — R079 Chest pain, unspecified: Secondary | ICD-10-CM | POA: Insufficient documentation

## 2014-03-31 DIAGNOSIS — O9989 Other specified diseases and conditions complicating pregnancy, childbirth and the puerperium: Secondary | ICD-10-CM

## 2014-03-31 DIAGNOSIS — R0789 Other chest pain: Secondary | ICD-10-CM

## 2014-03-31 LAB — URINALYSIS, ROUTINE W REFLEX MICROSCOPIC
Bilirubin Urine: NEGATIVE
Glucose, UA: NEGATIVE mg/dL
Hgb urine dipstick: NEGATIVE
Ketones, ur: NEGATIVE mg/dL
Leukocytes, UA: NEGATIVE
Nitrite: NEGATIVE
Protein, ur: NEGATIVE mg/dL
Specific Gravity, Urine: 1.02 (ref 1.005–1.030)
Urobilinogen, UA: 0.2 mg/dL (ref 0.0–1.0)
pH: 6 (ref 5.0–8.0)

## 2014-03-31 MED ORDER — GI COCKTAIL ~~LOC~~
30.0000 mL | Freq: Once | ORAL | Status: AC
  Administered 2014-03-31: 30 mL via ORAL
  Filled 2014-03-31: qty 30

## 2014-03-31 MED ORDER — HYDROCODONE-ACETAMINOPHEN 5-325 MG PO TABS
2.0000 | ORAL_TABLET | Freq: Once | ORAL | Status: AC
  Administered 2014-03-31: 2 via ORAL
  Filled 2014-03-31: qty 2

## 2014-03-31 MED ORDER — ACETAMINOPHEN 325 MG PO TABS: 650.0000 mg | ORAL_TABLET | Freq: Once | ORAL | Status: DC

## 2014-03-31 NOTE — Discharge Instructions (Signed)
You can take the Tylenol #3 that you have at home as directed if you need to for the pain, also ice to the painful areas for no longer than 20 minutes at a time throughout the day may help some  If you develop any other symptoms such as shortness of breath, please return  For reflux/indigestion you can take over the counter prilosec, zantac, or nexium as directed on the box  Musculoskeletal Pain Musculoskeletal pain is muscle and boney aches and pains. These pains can occur in any part of the body. Your caregiver may treat you without knowing the cause of the pain. They may treat you if blood or urine tests, X-rays, and other tests were normal.  CAUSES There is often not a definite cause or reason for these pains. These pains may be caused by a type of germ (virus). The discomfort may also come from overuse. Overuse includes working out too hard when your body is not fit. Boney aches also come from weather changes. Bone is sensitive to atmospheric pressure changes. HOME CARE INSTRUCTIONS   Ask when your test results will be ready. Make sure you get your test results.  Only take over-the-counter or prescription medicines for pain, discomfort, or fever as directed by your caregiver. If you were given medications for your condition, do not drive, operate machinery or power tools, or sign legal documents for 24 hours. Do not drink alcohol. Do not take sleeping pills or other medications that may interfere with treatment.  Continue all activities unless the activities cause more pain. When the pain lessens, slowly resume normal activities. Gradually increase the intensity and duration of the activities or exercise.  During periods of severe pain, bed rest may be helpful. Lay or sit in any position that is comfortable.  Putting ice on the injured area.  Put ice in a bag.  Place a towel between your skin and the bag.  Leave the ice on for 15 to 20 minutes, 3 to 4 times a day.  Follow up with  your caregiver for continued problems and no reason can be found for the pain. If the pain becomes worse or does not go away, it may be necessary to repeat tests or do additional testing. Your caregiver may need to look further for a possible cause. SEEK IMMEDIATE MEDICAL CARE IF:  You have pain that is getting worse and is not relieved by medications.  You develop chest pain that is associated with shortness or breath, sweating, feeling sick to your stomach (nauseous), or throw up (vomit).  Your pain becomes localized to the abdomen.  You develop any new symptoms that seem different or that concern you. MAKE SURE YOU:   Understand these instructions.  Will watch your condition.  Will get help right away if you are not doing well or get worse. Document Released: 02/16/2005 Document Revised: 05/11/2011 Document Reviewed: 10/21/2012 [facility] Patient Information 2015 [place], [place]. This information is not intended to replace advice given to you by your health care provider. Make sure you discuss any questions you have with your health care provider.

## 2014-03-31 NOTE — MAU Provider Note (Signed)
History  Chief Complaint:  Chest Pain and Back Pain  Sherri [name] is a 29 y.o. G1P0 female at [redacted]w[redacted]d presenting w/ report of constant sharp pain in sternum that hurts at exact same spot in back that began last night, worse w/ touch or position changes. Rates 9/10. Took 1 reg strength apap at 2000 last night w/o relief. No sob, diaphoresis, reflux, ruq pain. Hurts to the touch in both spots. No recent trauma or uri/coughing.  Did have a lot more n/v yesterday am than is usual for her, vomited until about 10-11am. Reports active fetal movement x 2, contractions: none, vaginal bleeding: none, membranes: intact. Denies uti s/s, abnormal/malodorous vag d/c or vulvovaginal itching/irritation.   Prenatal [facility] at [facility].  Next visit Tues. Pregnancy complicated by mono-di twin gestation, cervical shortening 2.97cm w/ BMZ on 1/21, decreased amniotic fluid Twin A, Twin B poly, uterine fibroid, rh neg, vb in Nov,   Obstetrical History: OB History    Gravida Para Term Preterm AB TAB SAB Ectopic Multiple Living   1         0      Past Medical History: Past Medical History  Diagnosis Date  . Uterine fibroid     Past Surgical History: Past Surgical History  Procedure Laterality Date  . No past surgeries      Social History: History   Social History  . Marital Status: Single    Spouse Name: N/A    Number of Children: N/A  . Years of Education: N/A   Social History Main Topics  . Smoking status: Never Smoker   . Smokeless tobacco: Never Used  . Alcohol Use: No     Comment: occasional  . Drug Use: No  . Sexual Activity: Yes    Birth Control/ Protection: None   Other Topics Concern  . None   Social History Narrative    Allergies: No Known Allergies  Prescriptions prior to admission  Medication Sig Dispense Refill Last Dose  . acetaminophen (TYLENOL) 325 MG tablet Take 650 mg by mouth every 6 (six) hours as needed for mild pain or headache.    Past Week at Unknown time  .  Acetaminophen-Codeine (TYLENOL/CODEINE #3) 300-30 MG per tablet Take 1 tablet by mouth every 6 (six) hours as needed for pain. 10 tablet 0   . bisacodyl (DULCOLAX) 10 MG suppository Place 1 suppository (10 mg total) rectally as needed for moderate constipation. (Patient not taking: Reported on 03/22/2014) 12 suppository 0 Taking  . bisacodyl (DULCOLAX) 10 MG suppository Place 10 mg rectally as needed for mild constipation or moderate constipation.   Past Week at Unknown time  . cyclobenzaprine (FLEXERIL) 5 MG tablet Take 1 tablet (5 mg total) by mouth 3 (three) times daily as needed for muscle spasms. 30 tablet 0 Past Week at Unknown time  . ondansetron (ZOFRAN-ODT) 8 MG disintegrating tablet Take 1 tablet (8 mg total) by mouth every 12 (twelve) hours. 20 tablet 0 03/22/2014 at Unknown time  . polyethylene glycol (MIRALAX / GLYCOLAX) packet Take 17 g by mouth daily. 14 each 1 Past Week at Unknown time  . Prenatal Vit-Fe Fumarate-FA (PRENATAL MULTIVITAMIN) TABS tablet Take 1 tablet by mouth daily.    Past Week at Unknown time  . promethazine (PHENERGAN) 25 MG tablet Take 1 tablet (25 mg total) by mouth every 6 (six) hours as needed for nausea or vomiting. 30 tablet 1 Past Week at Unknown time  . scopolamine (TRANSDERM-SCOP) 1 MG/3DAYS Place 1 patch (  1.5 mg total) onto the skin every 3 (three) days. 10 patch 12 Past Week at Unknown time    Review of Systems  Pertinent pos/neg as indicated in HPI  Physical Exam  Blood pressure 136/72, pulse 108, temperature 98 F (36.7 C), temperature source Oral, resp. rate 18, height 5' (1.524 m), weight 77.565 kg (171 lb), last menstrual period 10/03/2013. General appearance: alert, cooperative and no distress Lungs: clear to auscultation bilaterally, normal effort Heart: regular rate and rhythm Sternum and corresponding area on back tender to touch, no ecchymosis, erythema, or other skin changes Abdomen: gravid, soft, non-tender. No ruq pain or tenderness to  palpation.   Spec exam: n/a Cultures/Specimens: n/a    Fetal monitoring: FHR: A: 135bpm, B: 145 bpm, variability: moderate x2,  Accelerations: Present x 2,  decelerations:  Absent x 2 Uterine activity: none  MAU Course  EFM x 2, reassuring for GA GI cocktail- resolved chest pain completely Still having pain in back so requested pain meds- 2 vicodin 5/325mg  given Pulse ox 100% RA  Labs:  No results found for this or any previous visit (from the past 24 hour(s)).  Imaging:  n/a  Assessment and Plan  A:  [redacted]w[redacted]d SIUP  G1P0  Musculoskeletal chest/back pain  Reflux  Mono-Di Twin gestation  Cat 1 FHR x2 P:  D/C home  Has Tylenol #3 at home, can take as needed for pain  Ice to painful areas as needed  If develops sob or any new sx to return  OTC prilosec, zantac, nexium for reflux  Reviewed ptl s/s, fm, s/s to report  Keep next appt on Tues 2/2 as scheduled   Tawnya Crook CNM,WHNP-BC 1/30/20169:42 AM

## 2014-03-31 NOTE — MAU Note (Signed)
Patient presents at [redacted] weeks gestation with complaints of chest and back pain since last night. States pain is below her neck on both the front and back. Fetus active. Denies bleeding or discharge.

## 2014-04-03 ENCOUNTER — Ambulatory Visit (HOSPITAL_COMMUNITY)
Admission: RE | Admit: 2014-04-03 | Discharge: 2014-04-03 | Disposition: A | Discharge status: Home / Self Care | Payer: Medicaid Other | Source: Ambulatory Visit | Attending: Physician Assistant | Admitting: Physician Assistant

## 2014-04-03 ENCOUNTER — Ambulatory Visit (HOSPITAL_COMMUNITY)
Admission: RE | Admit: 2014-04-03 | Discharge: 2014-04-03 | Disposition: A | Discharge status: Home / Self Care | Payer: Medicaid Other | Source: Ambulatory Visit | Attending: Family Medicine | Admitting: Family Medicine

## 2014-04-03 DIAGNOSIS — O365920 Maternal care for other known or suspected poor fetal growth, second trimester, not applicable or unspecified: Secondary | ICD-10-CM | POA: Diagnosis present

## 2014-04-03 DIAGNOSIS — O365921 Maternal care for other known or suspected poor fetal growth, second trimester, fetus 1: Secondary | ICD-10-CM | POA: Insufficient documentation

## 2014-04-03 DIAGNOSIS — O30032 Twin pregnancy, monochorionic/diamniotic, second trimester: Secondary | ICD-10-CM | POA: Diagnosis not present

## 2014-04-03 DIAGNOSIS — Z3A26 26 weeks gestation of pregnancy: Secondary | ICD-10-CM | POA: Diagnosis not present

## 2014-04-03 DIAGNOSIS — IMO0001 Reserved for inherently not codable concepts without codable children: Secondary | ICD-10-CM

## 2014-04-04 ENCOUNTER — Inpatient Hospital Stay (HOSPITAL_COMMUNITY)
Admission: AD | Admit: 2014-04-04 | Discharge: 2014-04-04 | Disposition: A | Discharge status: Home / Self Care | Payer: Medicaid Other | Source: Ambulatory Visit | Attending: Obstetrics & Gynecology | Admitting: Obstetrics & Gynecology

## 2014-04-04 ENCOUNTER — Encounter (HOSPITAL_COMMUNITY): Payer: Self-pay | Admitting: *Deleted

## 2014-04-04 DIAGNOSIS — Z3A26 26 weeks gestation of pregnancy: Secondary | ICD-10-CM | POA: Insufficient documentation

## 2014-04-04 DIAGNOSIS — M546 Pain in thoracic spine: Secondary | ICD-10-CM | POA: Insufficient documentation

## 2014-04-04 DIAGNOSIS — O9989 Other specified diseases and conditions complicating pregnancy, childbirth and the puerperium: Secondary | ICD-10-CM | POA: Diagnosis not present

## 2014-04-04 DIAGNOSIS — J069 Acute upper respiratory infection, unspecified: Secondary | ICD-10-CM | POA: Diagnosis not present

## 2014-04-04 DIAGNOSIS — O30012 Twin pregnancy, monochorionic/monoamniotic, second trimester: Secondary | ICD-10-CM | POA: Diagnosis not present

## 2014-04-04 DIAGNOSIS — R0981 Nasal congestion: Secondary | ICD-10-CM | POA: Diagnosis present

## 2014-04-04 LAB — URINALYSIS, ROUTINE W REFLEX MICROSCOPIC
Bilirubin Urine: NEGATIVE
Glucose, UA: NEGATIVE mg/dL
Hgb urine dipstick: NEGATIVE
Ketones, ur: NEGATIVE mg/dL
Leukocytes, UA: NEGATIVE
Nitrite: NEGATIVE
Protein, ur: NEGATIVE mg/dL
Specific Gravity, Urine: 1.01 (ref 1.005–1.030)
Urobilinogen, UA: 0.2 mg/dL (ref 0.0–1.0)
pH: 6 (ref 5.0–8.0)

## 2014-04-04 MED ORDER — SODIUM CHLORIDE 0.9 % IN AERS: 1.0000 | INHALATION_SPRAY | RESPIRATORY_TRACT | Status: DC | PRN

## 2014-04-04 MED ORDER — ACETAMINOPHEN 325 MG PO TABS: 650.0000 mg | ORAL_TABLET | Freq: Four times a day (QID) | ORAL | Status: DC | PRN

## 2014-04-04 NOTE — MAU Note (Signed)
States she has a cold and has been coughing. C/O back pain.

## 2014-04-04 NOTE — MAU Provider Note (Signed)
Chief Complaint:  Cough and Back Pain   First Provider Initiated Contact with Patient 04/04/14 [phone]    HPI: Sherri [name] is a 29 y.o. G1P0 at [redacted]w[redacted]d who presents to maternity admissions reporting cough, nasal congestion, and thoracic back pain x24hr. Patient states that yesterday morning she woke up w/ a cough and congestion. This has not worsened since the onset. Cough is nonproductive. She has not taken anything for this. Patient states that her thoracic back pain started after the onset of the cough. Back pain is worsened w/ coughing--nothing seems to help. No radiation of pain. No weakness/parethesias. Patient denies fever, chills, n/v/d/c.  Denies contractions, leakage of fluid or vaginal bleeding. Good fetal movement.   Pregnancy Course:  A Negative  Clinic  Prenatal Labs  Dating  Blood type: A/Negative/-- (10/19 0000)  Genetic Screen 1 Screen:  normal               AFP:  negative         Antibody:Positive (10/19 0000)  Anatomic [place]  normal but limited Rubella: Immune (10/20 0000)  GTT Early:               Third trimester:  RPR: NON REAC (09/06 1232)   TDaP vaccine  HBsAg: Negative (10/20 0000)   Flu vaccine  HIV: NONREACTIVE (11/07 1319)   GBS  GBS:   Contraception  Pap:  Baby Food    Circumcision    Pediatrician    Support Person      Past Medical History: Past Medical History  Diagnosis Date  . Uterine fibroid     Past obstetric history: OB History  Gravida Para Term Preterm AB SAB TAB Ectopic Multiple Living  1         0    # Outcome Date GA Lbr Len/2nd Weight Sex Delivery Anes PTL Lv  1 Current               Past Surgical History: Past Surgical History  Procedure Laterality Date  . No past surgeries       Family History: History reviewed. No pertinent family history.  Social History: History  Substance Use Topics  . Smoking status: Never Smoker   . Smokeless tobacco: Never Used  . Alcohol Use: No     Comment: occasional    Allergies: No Known  Allergies  Meds:  Prescriptions prior to admission  Medication Sig Dispense Refill Last Dose  . Acetaminophen-Codeine (TYLENOL/CODEINE #3) 300-30 MG per tablet Take 1 tablet by mouth every 6 (six) hours as needed for pain. 10 tablet 0 04/03/2014 at Unknown time  . bisacodyl (DULCOLAX) 10 MG suppository Place 10 mg rectally as needed for mild constipation or moderate constipation.   Past Week at Unknown time  . cyclobenzaprine (FLEXERIL) 5 MG tablet Take 1 tablet (5 mg total) by mouth 3 (three) times daily as needed for muscle spasms. 30 tablet 0 04/03/2014 at Unknown time  . ondansetron (ZOFRAN-ODT) 8 MG disintegrating tablet Take 1 tablet (8 mg total) by mouth every 12 (twelve) hours. 20 tablet 0 Past Week at Unknown time  . polyethylene glycol (MIRALAX / GLYCOLAX) packet Take 17 g by mouth daily. 14 each 1 Past Week at Unknown time  . Prenatal Vit-Fe Fumarate-FA (PRENATAL MULTIVITAMIN) TABS tablet Take 1 tablet by mouth daily.    04/03/2014 at Unknown time  . scopolamine (TRANSDERM-SCOP) 1 MG/3DAYS Place 1 patch (1.5 mg total) onto the skin every 3 (three) days. 10 patch 12  04/04/2014 at Unknown time  . [DISCONTINUED] acetaminophen (TYLENOL) 325 MG tablet Take 650 mg by mouth every 6 (six) hours as needed for mild pain or headache.    Past Week at Unknown time  . promethazine (PHENERGAN) 25 MG tablet Take 1 tablet (25 mg total) by mouth every 6 (six) hours as needed for nausea or vomiting. (Patient not taking: Reported on 04/04/2014) 30 tablet 1 Past Week at Unknown time    ROS: Pertinent findings in history of present illness.  Physical Exam  Blood pressure 131/74, pulse 114, temperature 98.2 F (36.8 C), temperature source Oral, resp. rate 18, height 5' (1.524 m), weight 77.565 kg (171 lb), last menstrual period 10/03/2013, SpO2 98 %. GENERAL: Well-developed, well-nourished female in no acute distress. Sounds congested HEENT: normocephalic, EOMI, PERRLA, MMM, oropharynx nonerythematous.  HEART:  normal rate RESP: normal effort, CTAB ABDOMEN: Soft, non-tender, gravid appropriate for gestational age EXTREMITIES: Nontender, no edema, pulses equal and strong. NEURO: alert and oriented, speaks clearly  FHT:  Baseline A(145)/B(150), moderate variability Contractions: none   Labs: Results for orders placed or performed during the hospital encounter of 04/04/14 (from the past 24 hour(s))  Urinalysis, Routine w reflex microscopic     Status: None   Collection Time: 04/04/14  8:32 AM  Result Value Ref Range   Color, Urine YELLOW YELLOW   APPearance CLEAR CLEAR   Specific Gravity, Urine 1.010 1.005 - 1.030   pH 6.0 5.0 - 8.0   Glucose, UA NEGATIVE NEGATIVE mg/dL   Hgb urine dipstick NEGATIVE NEGATIVE   Bilirubin Urine NEGATIVE NEGATIVE   Ketones, ur NEGATIVE NEGATIVE mg/dL   Protein, ur NEGATIVE NEGATIVE mg/dL   Urobilinogen, UA 0.2 0.0 - 1.0 mg/dL   Nitrite NEGATIVE NEGATIVE   Leukocytes, UA NEGATIVE NEGATIVE    Assessment: - URI in pregnancy: patient c/o upper resp sxs over the past 24hr. No fevers, no SOB, nonproductive cough. Likely viral etiology. Patient has not tried anything for these symptoms. VSS. - Thoracic back pain: likely MSK etiology. Pain localized to musculature of T-spine. Likely 2/2 to onset and stress of coughing. No weakness, radiation, or paresthesias present.  Plan: - Discharge home - Patient provided prescription for nasal saline wash for congestion. Provided tylenol script for thoracic back pain. - Provided list of safe medications to take during pregnancy. - Labor precautions and fetal kick counts - Patient has prenatal visit on 04/10/14     Medication List    STOP taking these medications        promethazine 25 MG tablet  Commonly known as:  PHENERGAN      TAKE these medications        acetaminophen 325 MG tablet  Commonly known as:  TYLENOL  Take 2 tablets (650 mg total) by mouth every 6 (six) hours as needed for mild pain or headache.      Acetaminophen-Codeine 300-30 MG per tablet  Commonly known as:  TYLENOL/CODEINE #3  Take 1 tablet by mouth every 6 (six) hours as needed for pain.     bisacodyl 10 MG suppository  Commonly known as:  DULCOLAX  Place 10 mg rectally as needed for mild constipation or moderate constipation.     cyclobenzaprine 5 MG tablet  Commonly known as:  FLEXERIL  Take 1 tablet (5 mg total) by mouth 3 (three) times daily as needed for muscle spasms.     ondansetron 8 MG disintegrating tablet  Commonly known as:  ZOFRAN-ODT  Take 1 tablet (8 mg total)  by mouth every 12 (twelve) hours.     polyethylene glycol packet  Commonly known as:  MIRALAX / GLYCOLAX  Take 17 g by mouth daily.     prenatal multivitamin Tabs tablet  Take 1 tablet by mouth daily.     scopolamine 1 MG/3DAYS  Commonly known as:  TRANSDERM-SCOP  Place 1 patch (1.5 mg total) onto the skin every 3 (three) days.     sodium chloride inhaler solution  Commonly known as:  BRONCHO SALINE  Take 1 spray by nebulization as needed.        Elberta Leatherwood, MD 04/04/2014 10:28 AM  OB fellow attestation:  I have seen and examined this patient; I agree with above documentation in the resident's note.   Sherri [name] is a 29 y.o. G1P0 reporting nasal congestion +FM, denies LOF, VB, contractions, vaginal discharge.  PE: BP 137/77 mmHg  Pulse 91  Temp(Src) 98.2 F (36.8 C) (Oral)  Resp 18  Ht 5' (1.524 m)  Wt 171 lb (77.565 kg)  BMI 33.40 kg/m2  SpO2 98%  LMP 10/03/2013 Gen: calm comfortable, NAD Resp: normal effort, no distress Abd: gravid  ROS, labs, PMH reviewed NST reactive   Plan: mono-mono twins - fetal kick counts reinforced, preterm labor precautions - continue routine follow up in OB clinic - given list of medications safe in pregnancy - discussed frequent MAU visits and encouraged pt to follow up in clinic when able  Merla Riches, MD 5:18 PM

## 2014-04-04 NOTE — Discharge Instructions (Signed)
Upper Respiratory Infection, Adult An upper respiratory infection (URI) is also sometimes known as the common cold. The upper respiratory tract includes the nose, sinuses, throat, trachea, and bronchi. Bronchi are the airways leading to the lungs. Most people improve within 1 week, but symptoms can last up to 2 weeks. A residual cough may last even longer.  CAUSES Many different viruses can infect the tissues lining the upper respiratory tract. The tissues become irritated and inflamed and often become very moist. Mucus production is also common. A cold is contagious. You can easily spread the virus to others by oral contact. This includes kissing, sharing a glass, coughing, or sneezing. Touching your mouth or nose and then touching a surface, which is then touched by another person, can also spread the virus. SYMPTOMS  Symptoms typically develop 1 to 3 days after you come in contact with a cold virus. Symptoms vary from person to person. They may include:  Runny nose.  Sneezing.  Nasal congestion.  Sinus irritation.  Sore throat.  Loss of voice (laryngitis).  Cough.  Fatigue.  Muscle aches.  Loss of appetite.  Headache.  Low-grade fever. DIAGNOSIS  You might diagnose your own cold based on familiar symptoms, since most people get a cold 2 to 3 times a year. Your caregiver can confirm this based on your exam. Most importantly, your caregiver can check that your symptoms are not due to another disease such as strep throat, sinusitis, pneumonia, asthma, or epiglottitis. Blood tests, throat tests, and X-rays are not necessary to diagnose a common cold, but they may sometimes be helpful in excluding other more serious diseases. Your caregiver will decide if any further tests are required. RISKS AND COMPLICATIONS  You may be at risk for a more severe case of the common cold if you smoke cigarettes, have chronic heart disease (such as heart failure) or lung disease (such as asthma), or if  you have a weakened immune system. The very young and very old are also at risk for more serious infections. Bacterial sinusitis, middle ear infections, and bacterial pneumonia can complicate the common cold. The common cold can worsen asthma and chronic obstructive pulmonary disease (COPD). Sometimes, these complications can require emergency medical care and may be life-threatening. PREVENTION  The best way to protect against getting a cold is to practice good hygiene. Avoid oral or hand contact with people with cold symptoms. Wash your hands often if contact occurs. There is no clear evidence that vitamin C, vitamin E, echinacea, or exercise reduces the chance of developing a cold. However, it is always recommended to get plenty of rest and practice good nutrition. TREATMENT  Treatment is directed at relieving symptoms. There is no cure. Antibiotics are not effective, because the infection is caused by a virus, not by bacteria. Treatment may include:  Increased fluid intake. Sports drinks offer valuable electrolytes, sugars, and fluids.  Breathing heated mist or steam (vaporizer or shower).  Eating chicken soup or other clear broths, and maintaining good nutrition.  Getting plenty of rest.  Using gargles or lozenges for comfort.  Controlling fevers with ibuprofen or acetaminophen as directed by your caregiver.  Increasing usage of your inhaler if you have asthma. Zinc gel and zinc lozenges, taken in the first 24 hours of the common cold, can shorten the duration and lessen the severity of symptoms. Pain medicines may help with fever, muscle aches, and throat pain. A variety of non-prescription medicines are available to treat congestion and runny nose. Your caregiver   can make recommendations and may suggest nasal or lung inhalers for other symptoms.  HOME CARE INSTRUCTIONS   Only take over-the-counter or prescription medicines for pain, discomfort, or fever as directed by your  caregiver.  Use a warm mist humidifier or inhale steam from a shower to increase air moisture. This may keep secretions moist and make it easier to breathe.  Drink enough water and fluids to keep your urine clear or pale yellow.  Rest as needed.  Return to work when your temperature has returned to normal or as your caregiver advises. You may need to stay home longer to avoid infecting others. You can also use a face mask and careful hand washing to prevent spread of the virus. SEEK MEDICAL CARE IF:   After the first few days, you feel you are getting worse rather than better.  You need your caregiver's advice about medicines to control symptoms.  You develop chills, worsening shortness of breath, or brown or red sputum. These may be signs of pneumonia.  You develop yellow or brown nasal discharge or pain in the face, especially when you bend forward. These may be signs of sinusitis.  You develop a fever, swollen neck glands, pain with swallowing, or white areas in the back of your throat. These may be signs of strep throat. SEEK IMMEDIATE MEDICAL CARE IF:   You have a fever.  You develop severe or persistent headache, ear pain, sinus pain, or chest pain.  You develop wheezing, a prolonged cough, cough up blood, or have a change in your usual mucus (if you have chronic lung disease).  You develop sore muscles or a stiff neck. Document Released: 08/12/2000 Document Revised: 05/11/2011 Document Reviewed: 05/24/2013 Exit[employer] Patient Information 2015 [employer]. This information is not intended to replace advice given to you by your health care provider. Make sure you discuss any questions you have with your health care provider.  

## 2014-04-06 ENCOUNTER — Inpatient Hospital Stay (HOSPITAL_COMMUNITY)
Admission: AD | Admit: 2014-04-06 | Discharge: 2014-04-06 | Disposition: A | Discharge status: Home / Self Care | Payer: Medicaid Other | Source: Ambulatory Visit | Attending: Obstetrics & Gynecology | Admitting: Obstetrics & Gynecology

## 2014-04-06 DIAGNOSIS — O26893 Other specified pregnancy related conditions, third trimester: Secondary | ICD-10-CM

## 2014-04-06 DIAGNOSIS — O9989 Other specified diseases and conditions complicating pregnancy, childbirth and the puerperium: Secondary | ICD-10-CM | POA: Diagnosis present

## 2014-04-06 DIAGNOSIS — M549 Dorsalgia, unspecified: Secondary | ICD-10-CM | POA: Diagnosis not present

## 2014-04-06 DIAGNOSIS — R12 Heartburn: Secondary | ICD-10-CM | POA: Insufficient documentation

## 2014-04-06 DIAGNOSIS — O30003 Twin pregnancy, unspecified number of placenta and unspecified number of amniotic sacs, third trimester: Secondary | ICD-10-CM

## 2014-04-06 DIAGNOSIS — Z3A27 27 weeks gestation of pregnancy: Secondary | ICD-10-CM | POA: Insufficient documentation

## 2014-04-06 DIAGNOSIS — O30002 Twin pregnancy, unspecified number of placenta and unspecified number of amniotic sacs, second trimester: Secondary | ICD-10-CM | POA: Insufficient documentation

## 2014-04-06 DIAGNOSIS — O99891 Other specified diseases and conditions complicating pregnancy: Secondary | ICD-10-CM

## 2014-04-06 MED ORDER — CYCLOBENZAPRINE HCL 10 MG PO TABS: 10.0000 mg | ORAL_TABLET | Freq: Three times a day (TID) | ORAL | Status: DC | PRN

## 2014-04-06 MED ORDER — GI COCKTAIL ~~LOC~~
30.0000 mL | Freq: Once | ORAL | Status: AC
  Administered 2014-04-06: 30 mL via ORAL
  Filled 2014-04-06: qty 30

## 2014-04-06 MED ORDER — CYCLOBENZAPRINE HCL 10 MG PO TABS: 10.0000 mg | ORAL_TABLET | Freq: Once | ORAL | Status: DC

## 2014-04-06 NOTE — MAU Provider Note (Signed)
Chief Complaint:  No chief complaint on file.   None     HPI: Sherri [name] is a 29 y.o. G1P0 at [redacted]w[redacted]d who presents to maternity admissions via EMS reporting onset of chest burning at 10 pm last night, about 2 hours after eating spaghetti. She also reports upper and mid-back pain worsening since yesterday. She reports good fetal movement, denies cramping/contractions, LOF, vaginal bleeding, vaginal itching/burning, urinary symptoms, h/a, shortness of breath, dizziness, n/v, or fever/chills.     Past Medical History: Past Medical History  Diagnosis Date  . Uterine fibroid     Past obstetric history: OB History  Gravida Para Term Preterm AB SAB TAB Ectopic Multiple Living  1         0    # Outcome Date GA Lbr Len/2nd Weight Sex Delivery Anes PTL Lv  1 Current               Past Surgical History: Past Surgical History  Procedure Laterality Date  . No past surgeries      Family History: No family history on file.  Social History: History  Substance Use Topics  . Smoking status: Never Smoker   . Smokeless tobacco: Never Used  . Alcohol Use: No     Comment: occasional    Allergies: No Known Allergies  Meds:  Prescriptions prior to admission  Medication Sig Dispense Refill Last Dose  . acetaminophen (TYLENOL) 325 MG tablet Take 2 tablets (650 mg total) by mouth every 6 (six) hours as needed for mild pain or headache. 30 tablet 1   . Acetaminophen-Codeine (TYLENOL/CODEINE #3) 300-30 MG per tablet Take 1 tablet by mouth every 6 (six) hours as needed for pain. 10 tablet 0 04/03/2014 at Unknown time  . bisacodyl (DULCOLAX) 10 MG suppository Place 10 mg rectally as needed for mild constipation or moderate constipation.   Past Week at Unknown time  . cyclobenzaprine (FLEXERIL) 5 MG tablet Take 1 tablet (5 mg total) by mouth 3 (three) times daily as needed for muscle spasms. 30 tablet 0 04/03/2014 at Unknown time  . ondansetron (ZOFRAN-ODT) 8 MG disintegrating tablet Take 1 tablet  (8 mg total) by mouth every 12 (twelve) hours. 20 tablet 0 Past Week at Unknown time  . polyethylene glycol (MIRALAX / GLYCOLAX) packet Take 17 g by mouth daily. 14 each 1 Past Week at Unknown time  . Prenatal Vit-Fe Fumarate-FA (PRENATAL MULTIVITAMIN) TABS tablet Take 1 tablet by mouth daily.    04/03/2014 at Unknown time  . scopolamine (TRANSDERM-SCOP) 1 MG/3DAYS Place 1 patch (1.5 mg total) onto the skin every 3 (three) days. 10 patch 12 04/04/2014 at Unknown time  . sodium chloride (BRONCHO SALINE) inhaler solution Take 1 spray by nebulization as needed. 90 mL 12     ROS: Pertinent findings in history of present illness.  Physical Exam  Blood pressure 134/74, pulse 90, temperature 98.6 F (37 C), temperature source Oral, resp. rate 18, last menstrual period 10/03/2013, SpO2 100 %. GENERAL: Well-developed, well-nourished female in no acute distress.  HEENT: normocephalic HEART: normal rate, heart sounds, regular rhythm RESP: normal effort, lung sounds clear and equal bilaterally ABDOMEN: Soft, non-tender, gravid appropriate for gestational age EXTREMITIES: Nontender, no edema NEURO: alert and oriented     FHT Baby A:  Baseline 140 , moderate variability, accelerations present, no decelerations FHT Baby B:  Baseline 135 , moderate variability, accelerations present, no decelerations Contractions: None on toco or to palpation   Labs: No results found for  this or any previous visit (from the past 24 hour(s)).  ED Course GI Cocktail and Flexeril 10 mg x 1 dose in MAU  Assessment: 1. Heartburn during pregnancy in third trimester   2. Back pain affecting pregnancy in third trimester   3. Twin pregnancy in third trimester    Report to Kerry Hough, [place] Certified Nurse-Midwife 04/06/2014 8:19 AM  [phone] - Care assumed from Fatima Blank, CNM Patient reports some improvement in GERD symptoms with GI cocktail  A: As above  P: Discharge home Rx for  Flexeril and Zantac given to patient AVS contains information about diet for GERD Patient advised to follow-up with [facility] as scheduled for routine prenatal care Patient may return to MAU as needed or if her condition were to change or worsen   Luvenia Redden, PA-C 04/06/2014 8:30 AM

## 2014-04-06 NOTE — Discharge Instructions (Signed)
Food Choices for Gastroesophageal Reflux Disease When you have gastroesophageal reflux disease (GERD), the foods you eat and your eating habits are very important. Choosing the right foods can help ease the discomfort of GERD. WHAT GENERAL GUIDELINES DO I NEED TO FOLLOW?  Choose fruits, vegetables, whole grains, low-fat dairy products, and low-fat meat, fish, and poultry.  Limit fats such as oils, salad dressings, butter, nuts, and avocado.  Keep a food diary to identify foods that cause symptoms.  Avoid foods that cause reflux. These may be different for different people.  Eat frequent small meals instead of three large meals each day.  Eat your meals slowly, in a relaxed setting.  Limit fried foods.  Cook foods using methods other than frying.  Avoid drinking alcohol.  Avoid drinking large amounts of liquids with your meals.  Avoid bending over or lying down until 2-3 hours after eating. WHAT FOODS ARE NOT RECOMMENDED? The following are some foods and drinks that may worsen your symptoms: Vegetables Tomatoes. Tomato juice. Tomato and spaghetti sauce. Chili peppers. Onion and garlic. Horseradish. Fruits Oranges, grapefruit, and lemon (fruit and juice). Meats High-fat meats, fish, and poultry. This includes hot dogs, ribs, ham, sausage, salami, and bacon. Dairy Whole milk and chocolate milk. Sour cream. Cream. Butter. Ice cream. Cream cheese.  Beverages Coffee and tea, with or without caffeine. Carbonated beverages or energy drinks. Condiments Hot sauce. Barbecue sauce.  Sweets/Desserts Chocolate and cocoa. Donuts. Peppermint and spearmint. Fats and Oils High-fat foods, including [place] fries and potato chips. Other Vinegar. Strong spices, such as black pepper, white pepper, red pepper, cayenne, curry powder, cloves, ginger, and chili powder. The items listed above may not be a complete list of foods and beverages to avoid. Contact your dietitian for more  information. Document Released: 02/16/2005 Document Revised: 02/21/2013 Document Reviewed: 12/21/2012 [facility] Patient Information 2015 [place], [place]. This information is not intended to replace advice given to you by your health care provider. Make sure you discuss any questions you have with your health care provider. Back Pain in Pregnancy Back pain during pregnancy is common. It happens in about half of all pregnancies. It is important for you and your baby that you remain active during your pregnancy.If you feel that back pain is not allowing you to remain active or sleep well, it is time to see your caregiver. Back pain may be caused by several factors related to changes during your pregnancy.Fortunately, unless you had trouble with your back before your pregnancy, the pain is likely to get better after you deliver. Low back pain usually occurs between the fifth and seventh months of pregnancy. It can, however, happen in the first couple months. Factors that increase the risk of back problems include:   Previous back problems.  Injury to your back.  Having twins or multiple births.  A chronic cough.  Stress.  Job-related repetitive motions.  Muscle or spinal disease in the back.  Family history of back problems, ruptured (herniated) discs, or osteoporosis.  Depression, anxiety, and panic attacks. CAUSES   When you are pregnant, your body produces a hormone called relaxin. This hormonemakes the ligaments connecting the low back and pubic bones more flexible. This flexibility allows the baby to be delivered more easily. When your ligaments are loose, your muscles need to work harder to support your back. Soreness in your back can come from tired muscles. Soreness can also come from back tissues that are irritated since they are receiving less support.  As the baby  grows, it puts pressure on the nerves and blood vessels in your pelvis. This can cause back pain.  As the baby  grows and gets heavier during pregnancy, the uterus pushes the stomach muscles forward and changes your center of gravity. This makes your back muscles work harder to maintain good posture. SYMPTOMS  Lumbar pain during pregnancy Lumbar pain during pregnancy usually occurs at or above the waist in the center of the back. There may be pain and numbness that radiates into your leg or foot. This is similar to low back pain experienced by non-pregnant women. It usually increases with sitting for long periods of time, standing, or repetitive lifting. Tenderness may also be present in the muscles along your upper back. Posterior pelvic pain during pregnancy Pain in the back of the pelvis is more common than lumbar pain in pregnancy. It is a deep pain felt in your side at the waistline, or across the tailbone (sacrum), or in both places. You may have pain on one or both sides. This pain can also go into the buttocks and backs of the upper thighs. Pubic and groin pain may also be present. The pain does not quickly resolve with rest, and morning stiffness may also be present. Pelvic pain during pregnancy can be brought on by most activities. A high level of fitness before and during pregnancy may or may not prevent this problem. Labor pain is usually 1 to 2 minutes apart, lasts for about 1 minute, and involves a bearing down feeling or pressure in your pelvis. However, if you are at term with the pregnancy, constant low back pain can be the beginning of early labor, and you should be aware of this. DIAGNOSIS  X-rays of the back should not be done during the first 12 to 14 weeks of the pregnancy and only when absolutely necessary during the rest of the pregnancy. MRIs do not give off radiation and are safe during pregnancy. MRIs also should only be done when absolutely necessary. HOME CARE INSTRUCTIONS  Exercise as directed by your caregiver. Exercise is the most effective way to prevent or manage back pain. If you  have a back problem, it is especially important to avoid sports that require sudden body movements. Swimming and walking are great activities.  Do not stand in one place for long periods of time.  Do not wear high heels.  Sit in chairs with good posture. Use a pillow on your lower back if necessary. Make sure your head rests over your shoulders and is not hanging forward.  Try sleeping on your side, preferably the left side, with a pillow or two between your legs. If you are sore after a night's rest, your bedmay betoo soft.Try placing a board between your mattress and box spring.  Listen to your body when lifting.If you are experiencing pain, ask for help or try bending yourknees more so you can use your leg muscles rather than your back muscles. Squat down when picking up something from the floor. Do not bend over.  Eat a healthy diet. Try to gain weight within your caregiver's recommendations.  Use heat or cold packs 3 to 4 times a day for 15 minutes to help with the pain.  Only take over-the-counter or prescription medicines for pain, discomfort, or fever as directed by your caregiver. Sudden (acute) back pain  Use bed rest for only the most extreme, acute episodes of back pain. Prolonged bed rest over 48 hours will aggravate your condition.  Ice  is very effective for acute conditions.  Put ice in a plastic bag.  Place a towel between your skin and the bag.  Leave the ice on for 10 to 20 minutes every 2 hours, or as needed.  Using heat packs for 30 minutes prior to activities is also helpful. Continued back pain See your caregiver if you have continued problems. Your caregiver can help or refer you for appropriate physical therapy. With conditioning, most back problems can be avoided. Sometimes, a more serious issue may be the cause of back pain. You should be seen right away if new problems seem to be developing. Your caregiver may recommend:  A maternity girdle.  An  elastic sling.  A back brace.  A massage therapist or acupuncture. SEEK MEDICAL CARE IF:   You are not able to do most of your daily activities, even when taking the pain medicine you were given.  You need a referral to a physical therapist or chiropractor.  You want to try acupuncture. SEEK IMMEDIATE MEDICAL CARE IF:  You develop numbness, tingling, weakness, or problems with the use of your arms or legs.  You develop severe back pain that is no longer relieved with medicines.  You have a sudden change in bowel or bladder control.  You have increasing pain in other areas of the body.  You develop shortness of breath, dizziness, or fainting.  You develop nausea, vomiting, or sweating.  You have back pain which is similar to labor pains.  You have back pain along with your water breaking or vaginal bleeding.  You have back pain or numbness that travels down your leg.  Your back pain developed after you fell.  You develop pain on one side of your back. You may have a kidney stone.  You see blood in your urine. You may have a bladder infection or kidney stone.  You have back pain with blisters. You may have shingles. Back pain is fairly common during pregnancy but should not be accepted as just part of the process. Back pain should always be treated as soon as possible. This will make your pregnancy as pleasant as possible. Document Released: 05/27/2005 Document Revised: 05/11/2011 Document Reviewed: 07/08/2010 [facility] Patient Information 2015 [place], [place]. This information is not intended to replace advice given to you by your health care provider. Make sure you discuss any questions you have with your health care provider.

## 2014-04-06 NOTE — MAU Note (Signed)
Pt arrives via EMS, states she was seen here last week and was given a GI cocktail for reflux , states it has continued to burn and hurt since her discharge and it got a lot worse tonight. States she has been using OTC's without relief.

## 2014-04-10 ENCOUNTER — Encounter: Payer: Self-pay | Admitting: Obstetrics and Gynecology

## 2014-04-10 ENCOUNTER — Other Ambulatory Visit: Payer: Self-pay | Admitting: Family Medicine

## 2014-04-10 ENCOUNTER — Ambulatory Visit (HOSPITAL_COMMUNITY)
Admission: RE | Admit: 2014-04-10 | Discharge: 2014-04-10 | Disposition: A | Discharge status: Home / Self Care | Payer: Medicaid Other | Source: Ambulatory Visit | Attending: Family Medicine | Admitting: Family Medicine

## 2014-04-10 ENCOUNTER — Ambulatory Visit (INDEPENDENT_AMBULATORY_CARE_PROVIDER_SITE_OTHER): Payer: Medicaid Other | Admitting: Obstetrics and Gynecology

## 2014-04-10 VITALS — BP 130/67 | HR 100 | Wt 171.6 lb

## 2014-04-10 VITALS — BP 124/81 | HR 114 | Wt 172.0 lb

## 2014-04-10 DIAGNOSIS — O360122 Maternal care for anti-D [Rh] antibodies, second trimester, fetus 2: Secondary | ICD-10-CM

## 2014-04-10 DIAGNOSIS — O283 Abnormal ultrasonic finding on antenatal screening of mother: Secondary | ICD-10-CM

## 2014-04-10 DIAGNOSIS — Z3A27 27 weeks gestation of pregnancy: Secondary | ICD-10-CM | POA: Insufficient documentation

## 2014-04-10 DIAGNOSIS — IMO0001 Reserved for inherently not codable concepts without codable children: Secondary | ICD-10-CM

## 2014-04-10 DIAGNOSIS — E669 Obesity, unspecified: Secondary | ICD-10-CM

## 2014-04-10 DIAGNOSIS — O30032 Twin pregnancy, monochorionic/diamniotic, second trimester: Secondary | ICD-10-CM | POA: Insufficient documentation

## 2014-04-10 DIAGNOSIS — O365921 Maternal care for other known or suspected poor fetal growth, second trimester, fetus 1: Secondary | ICD-10-CM | POA: Insufficient documentation

## 2014-04-10 DIAGNOSIS — O360120 Maternal care for anti-D [Rh] antibodies, second trimester, not applicable or unspecified: Secondary | ICD-10-CM

## 2014-04-10 DIAGNOSIS — Z23 Encounter for immunization: Secondary | ICD-10-CM

## 2014-04-10 DIAGNOSIS — O365920 Maternal care for other known or suspected poor fetal growth, second trimester, not applicable or unspecified: Secondary | ICD-10-CM | POA: Diagnosis present

## 2014-04-10 LAB — POCT URINALYSIS DIP (DEVICE)
Bilirubin Urine: NEGATIVE
Glucose, UA: NEGATIVE mg/dL
Hgb urine dipstick: NEGATIVE
Ketones, ur: NEGATIVE mg/dL
Nitrite: NEGATIVE
Protein, ur: NEGATIVE mg/dL
Specific Gravity, Urine: 1.015 (ref 1.005–1.030)
Urobilinogen, UA: 0.2 mg/dL (ref 0.0–1.0)
pH: 6 (ref 5.0–8.0)

## 2014-04-10 MED ORDER — TETANUS-DIPHTH-ACELL PERTUSSIS 5-2.5-18.5 LF-MCG/0.5 IM SUSP
0.5000 mL | Freq: Once | INTRAMUSCULAR | Status: AC
  Administered 2014-04-10: 0.5 mL via INTRAMUSCULAR

## 2014-04-10 MED ORDER — RHO D IMMUNE GLOBULIN 1500 UNITS IM SOSY
300.0000 ug | PREFILLED_SYRINGE | Freq: Once | INTRAMUSCULAR | Status: AC
  Administered 2014-04-10: 300 ug via INTRAMUSCULAR

## 2014-04-10 NOTE — Progress Notes (Signed)
Patient is doing well but with a lot of third trimester complaints. Back pain, leg pain, contractions. Reassurance provided. Patient requested to be placed on bed rest- no medical indications at this time. F/U growth ultrasound next week FM/PTL precautions reviewed

## 2014-04-10 NOTE — Addendum Note (Signed)
Addended by: Rutherford Nail E on: 04/10/2014 04:23 PM   Modules accepted: Orders

## 2014-04-10 NOTE — Progress Notes (Signed)
C/o contractions " a lot" - states a little more than when she came to MAU on 04/06/14. Will come tomorrow for 28 week labs.

## 2014-04-11 ENCOUNTER — Telehealth: Payer: Self-pay | Admitting: General Practice

## 2014-04-11 NOTE — Telephone Encounter (Signed)
Patient called and left message stating she is a patient of Dr Elly Modena and she was supposed to send in a medication for her that started with a "p" for her chest pain to her [facility] on [address] but has not yet. Will speak to Dr Elly Modena this afternoon about patient's request.

## 2014-04-12 ENCOUNTER — Other Ambulatory Visit: Payer: Medicaid Other

## 2014-04-12 DIAGNOSIS — Z3492 Encounter for supervision of normal pregnancy, unspecified, second trimester: Secondary | ICD-10-CM

## 2014-04-12 LAB — HIV ANTIBODY (ROUTINE TESTING W REFLEX): HIV 1&2 Ab, 4th Generation: NONREACTIVE

## 2014-04-12 LAB — CBC
HCT: 35.5 % — ABNORMAL LOW (ref 36.0–46.0)
Hemoglobin: 11.7 g/dL — ABNORMAL LOW (ref 12.0–15.0)
MCH: 28.8 pg (ref 26.0–34.0)
MCHC: 33 g/dL (ref 30.0–36.0)
MCV: 87.4 fL (ref 78.0–100.0)
MPV: 10.2 fL (ref 8.6–12.4)
Platelets: 210 10*3/uL (ref 150–400)
RBC: 4.06 MIL/uL (ref 3.87–5.11)
RDW: 15.1 % (ref 11.5–15.5)
WBC: 9.3 10*3/uL (ref 4.0–10.5)

## 2014-04-12 LAB — RPR

## 2014-04-12 LAB — GLUCOSE TOLERANCE, 1 HOUR (50G) W/O FASTING: Glucose, 1 Hour GTT: 82 mg/dL (ref 70–140)

## 2014-04-12 MED ORDER — PANTOPRAZOLE SODIUM 40 MG PO TBEC: 40.0000 mg | DELAYED_RELEASE_TABLET | Freq: Every day | ORAL | Status: DC

## 2014-04-12 NOTE — Telephone Encounter (Signed)
Dr Elly Modena addressed this in clinic.

## 2014-04-13 ENCOUNTER — Other Ambulatory Visit: Payer: Medicaid Other

## 2014-04-15 ENCOUNTER — Inpatient Hospital Stay (HOSPITAL_COMMUNITY)
Admission: AD | Admit: 2014-04-15 | Discharge: 2014-04-16 | DRG: 778 | Disposition: A | Discharge status: Home / Self Care | Payer: Medicaid Other | Source: Ambulatory Visit | Attending: Obstetrics & Gynecology | Admitting: Obstetrics & Gynecology

## 2014-04-15 ENCOUNTER — Inpatient Hospital Stay (HOSPITAL_COMMUNITY): Payer: Medicaid Other

## 2014-04-15 ENCOUNTER — Encounter (HOSPITAL_COMMUNITY): Payer: Self-pay | Admitting: General Practice

## 2014-04-15 DIAGNOSIS — O26873 Cervical shortening, third trimester: Secondary | ICD-10-CM | POA: Diagnosis present

## 2014-04-15 DIAGNOSIS — Z3A28 28 weeks gestation of pregnancy: Secondary | ICD-10-CM | POA: Diagnosis present

## 2014-04-15 DIAGNOSIS — O283 Abnormal ultrasonic finding on antenatal screening of mother: Secondary | ICD-10-CM

## 2014-04-15 DIAGNOSIS — O09899 Supervision of other high risk pregnancies, unspecified trimester: Secondary | ICD-10-CM | POA: Insufficient documentation

## 2014-04-15 DIAGNOSIS — O3090 Multiple gestation, unspecified, unspecified trimester: Secondary | ICD-10-CM | POA: Insufficient documentation

## 2014-04-15 DIAGNOSIS — O3433 Maternal care for cervical incompetence, third trimester: Secondary | ICD-10-CM | POA: Diagnosis present

## 2014-04-15 DIAGNOSIS — R8789 Other abnormal findings in specimens from female genital organs: Secondary | ICD-10-CM

## 2014-04-15 DIAGNOSIS — R109 Unspecified abdominal pain: Secondary | ICD-10-CM | POA: Diagnosis present

## 2014-04-15 LAB — URINE MICROSCOPIC-ADD ON

## 2014-04-15 LAB — URINALYSIS, ROUTINE W REFLEX MICROSCOPIC
Bilirubin Urine: NEGATIVE
Glucose, UA: NEGATIVE mg/dL
Hgb urine dipstick: NEGATIVE
Ketones, ur: NEGATIVE mg/dL
Nitrite: NEGATIVE
Protein, ur: NEGATIVE mg/dL
Specific Gravity, Urine: 1.02 (ref 1.005–1.030)
Urobilinogen, UA: 0.2 mg/dL (ref 0.0–1.0)
pH: 6 (ref 5.0–8.0)

## 2014-04-15 LAB — COMPREHENSIVE METABOLIC PANEL
ALT: 16 U/L (ref 0–35)
AST: 30 U/L (ref 0–37)
Albumin: 3.1 g/dL — ABNORMAL LOW (ref 3.5–5.2)
Alkaline Phosphatase: 186 U/L — ABNORMAL HIGH (ref 39–117)
Anion gap: 5 (ref 5–15)
BUN: 6 mg/dL (ref 6–23)
CO2: 20 mmol/L (ref 19–32)
Calcium: 9.1 mg/dL (ref 8.4–10.5)
Chloride: 111 mmol/L (ref 96–112)
Creatinine, Ser: 0.63 mg/dL (ref 0.50–1.10)
GFR calc Af Amer: >90 mL/min (ref >90)
GFR calc non Af Amer: >90 mL/min (ref >90)
Glucose, Bld: 109 mg/dL — ABNORMAL HIGH (ref 70–99)
Potassium: 4.2 mmol/L (ref 3.5–5.1)
Sodium: 136 mmol/L (ref 135–145)
Total Bilirubin: 0.7 mg/dL (ref 0.3–1.2)
Total Protein: 6.7 g/dL (ref 6.0–8.3)

## 2014-04-15 LAB — CBC
HCT: 35.1 % — ABNORMAL LOW (ref 36.0–46.0)
Hemoglobin: 11.7 g/dL — ABNORMAL LOW (ref 12.0–15.0)
MCH: 29.2 pg (ref 26.0–34.0)
MCHC: 33.3 g/dL (ref 30.0–36.0)
MCV: 87.5 fL (ref 78.0–100.0)
Platelets: 201 10*3/uL (ref 150–400)
RBC: 4.01 MIL/uL (ref 3.87–5.11)
RDW: 15.4 % (ref 11.5–15.5)
WBC: 9.4 10*3/uL (ref 4.0–10.5)

## 2014-04-15 LAB — PROTEIN / CREATININE RATIO, URINE
Creatinine, Urine: 30 mg/dL
Total Protein, Urine: <6 mg/dL

## 2014-04-15 LAB — PREPARE RBC (CROSSMATCH)

## 2014-04-15 LAB — FETAL FIBRONECTIN: Fetal Fibronectin: POSITIVE — AB

## 2014-04-15 MED ORDER — DOCUSATE SODIUM 100 MG PO CAPS
100.0000 mg | ORAL_CAPSULE | Freq: Every day | ORAL | Status: DC
  Administered 2014-04-15 – 2014-04-16 (×2): 100 mg via ORAL
  Filled 2014-04-15 (×2): qty 1

## 2014-04-15 MED ORDER — MAGNESIUM SULFATE BOLUS VIA INFUSION
4.0000 g | Freq: Once | INTRAVENOUS | Status: AC
  Administered 2014-04-15: 4 g via INTRAVENOUS
  Filled 2014-04-15: qty 500

## 2014-04-15 MED ORDER — BETAMETHASONE SOD PHOS & ACET 6 (3-3) MG/ML IJ SUSP
12.0000 mg | Freq: Once | INTRAMUSCULAR | Status: AC
  Administered 2014-04-15: 12 mg via INTRAMUSCULAR
  Filled 2014-04-15: qty 2

## 2014-04-15 MED ORDER — BETAMETHASONE SOD PHOS & ACET 6 (3-3) MG/ML IJ SUSP
12.0000 mg | INTRAMUSCULAR | Status: AC
  Administered 2014-04-16: 12 mg via INTRAMUSCULAR
  Filled 2014-04-15: qty 2

## 2014-04-15 MED ORDER — PROMETHAZINE HCL 25 MG/ML IJ SOLN
12.5000 mg | Freq: Four times a day (QID) | INTRAMUSCULAR | Status: DC | PRN
  Filled 2014-04-15: qty 1

## 2014-04-15 MED ORDER — ZOLPIDEM TARTRATE 5 MG PO TABS
5.0000 mg | ORAL_TABLET | Freq: Every evening | ORAL | Status: DC | PRN
  Administered 2014-04-15: 5 mg via ORAL
  Filled 2014-04-15: qty 1

## 2014-04-15 MED ORDER — ACETAMINOPHEN 325 MG PO TABS
650.0000 mg | ORAL_TABLET | ORAL | Status: DC | PRN
  Administered 2014-04-15: 650 mg via ORAL
  Filled 2014-04-15: qty 2

## 2014-04-15 MED ORDER — SODIUM CHLORIDE 0.9 % IV SOLN
INTRAVENOUS | Status: DC
  Administered 2014-04-15 (×2): via INTRAVENOUS

## 2014-04-15 MED ORDER — CALCIUM CARBONATE ANTACID 500 MG PO CHEW
2.0000 | CHEWABLE_TABLET | ORAL | Status: DC | PRN
  Administered 2014-04-16: 200 mg via ORAL
  Administered 2014-04-16: 400 mg via ORAL
  Filled 2014-04-15 (×2): qty 1

## 2014-04-15 MED ORDER — MAGNESIUM SULFATE 40 G IN LACTATED RINGERS - SIMPLE
2.0000 g/h | INTRAVENOUS | Status: DC
  Administered 2014-04-15: 2 g/h via INTRAVENOUS
  Filled 2014-04-15: qty 500

## 2014-04-15 MED ORDER — PRENATAL MULTIVITAMIN CH
1.0000 | ORAL_TABLET | Freq: Every day | ORAL | Status: DC
  Administered 2014-04-15: 1 via ORAL
  Filled 2014-04-15 (×2): qty 1

## 2014-04-15 NOTE — H&P (Signed)
FACULTY PRACTICE ANTEPARTUM ADMISSION HISTORY AND PHYSICAL NOTE   History of Present Illness: Sherri [name] is a 29 y.o. G1P0 at [redacted]w[redacted]d admitted for abdominal cramping concerning for preterm labor in setting of +FFN and dynamic shortened cervix  Patient reports the fetal movement as active. Patient reports uterine contraction  activity as rare, uterine irritability noted. Patient reports  vaginal bleeding as none. Patient describes fluid per vagina as None. Fetal presentation is Twin A cephalic, twin B transverse.  Patient Active Problem List   Diagnosis Date Noted  . Preterm labor 04/15/2014  . [redacted] weeks gestation of pregnancy   . Poor fetal growth affecting management of mother in second trimester   . [redacted] weeks gestation of pregnancy   . Decreased amniotic fluid 03/15/2014  . Abnormal fetal ultrasound 03/15/2014  . [redacted] weeks gestation of pregnancy   . [redacted] weeks gestation of pregnancy   . Evaluate anatomy not seen on prior sonogram   . [redacted] weeks gestation of pregnancy   . Abdominal pain affecting pregnancy   . Cervical shortening affecting pregnancy in second trimester, antepartum   . Monochorionic diamniotic twin pregnancy   . Monoamniotic and monochorionic twin gestation in second trimester   . [redacted] weeks gestation of pregnancy   . Twins   . 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 [redacted] weeks gestation of pregnancy   . Vaginal bleeding in pregnancy   . [redacted] weeks gestation of pregnancy   . 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 Monochorionic diamniotic twin gestation 12/25/2013  . Uterine fibroid 12/25/2013  . Obesity 12/25/2013  . Rh negative status during pregnancy 12/25/2013  . Abdominal pain 12/19/2010     Past Medical History  Diagnosis Date  . Uterine fibroid      Past Surgical History  Procedure Laterality Date  . No past surgeries       OB History    Gravida Para Term Preterm AB TAB SAB Ectopic Multiple Living    1         0      History   Social History  . Marital Status: Single    Spouse Name: N/A  . Number of Children: N/A  . Years of Education: N/A   Social History Main Topics  . Smoking status: Never Smoker   . Smokeless tobacco: Never Used  . Alcohol Use: No     Comment: occasional  . Drug Use: No  . Sexual Activity: Yes    Birth Control/ Protection: None   Other Topics Concern  . None   Social History Narrative    History reviewed. No pertinent family history.  No Known Allergies  Prescriptions prior to admission  Medication Sig Dispense Refill Last Dose  . bisacodyl (DULCOLAX) 10 MG suppository Place 10 mg rectally as needed for mild constipation or moderate constipation.   Past Week at Unknown time  . polyethylene glycol (MIRALAX / GLYCOLAX) packet Take 17 g by mouth daily. 14 each 1 Past Week at Unknown time  . Prenatal Vit-Fe Fumarate-FA (PRENATAL MULTIVITAMIN) TABS tablet Take 1 tablet by mouth daily.    04/14/2014 at Unknown time  . sodium chloride (BRONCHO SALINE) inhaler solution Take 1 spray by nebulization as needed. 90 mL 12 Past Week at Unknown time  . acetaminophen (TYLENOL) 325 MG tablet Take 2 tablets (650 mg total) by mouth every 6 (six) hours as needed for mild pain or headache. 30 tablet 1 04/13/2014  .  Acetaminophen-Codeine (TYLENOL/CODEINE #3) 300-30 MG per tablet Take 1 tablet by mouth every 6 (six) hours as needed for pain. (Patient not taking: Reported on 04/15/2014) 10 tablet 0 Taking  . cyclobenzaprine (FLEXERIL) 10 MG tablet Take 1 tablet (10 mg total) by mouth 3 (three) times daily as needed for muscle spasms. 30 tablet 0 04/12/2014  . ondansetron (ZOFRAN-ODT) 8 MG disintegrating tablet Take 1 tablet (8 mg total) by mouth every 12 (twelve) hours. 20 tablet 0 04/12/2014  . pantoprazole (PROTONIX) 40 MG tablet Take 1 tablet (40 mg total) by mouth daily. 30 tablet 3 04/12/2014  . scopolamine (TRANSDERM-SCOP) 1 MG/3DAYS Place 1 patch (1.5 mg total) onto the  skin every 3 (three) days. 10 patch 12 04/13/2014    . [START ON 04/16/2014] betamethasone acetate-betamethasone sodium phosphate  12 mg Intramuscular Q24H  . docusate sodium  100 mg Oral Daily  . magnesium  4 g Intravenous Once  . prenatal multivitamin  1 tablet Oral Q1200   I have reviewed the patient's current medications. Prior to Admission:  Prescriptions prior to admission  Medication Sig Dispense Refill Last Dose  . bisacodyl (DULCOLAX) 10 MG suppository Place 10 mg rectally as needed for mild constipation or moderate constipation.   Past Week at Unknown time  . polyethylene glycol (MIRALAX / GLYCOLAX) packet Take 17 g by mouth daily. 14 each 1 Past Week at Unknown time  . Prenatal Vit-Fe Fumarate-FA (PRENATAL MULTIVITAMIN) TABS tablet Take 1 tablet by mouth daily.    04/14/2014 at Unknown time  . sodium chloride (BRONCHO SALINE) inhaler solution Take 1 spray by nebulization as needed. 90 mL 12 Past Week at Unknown time  . acetaminophen (TYLENOL) 325 MG tablet Take 2 tablets (650 mg total) by mouth every 6 (six) hours as needed for mild pain or headache. 30 tablet 1 04/13/2014  . Acetaminophen-Codeine (TYLENOL/CODEINE #3) 300-30 MG per tablet Take 1 tablet by mouth every 6 (six) hours as needed for pain. (Patient not taking: Reported on 04/15/2014) 10 tablet 0 Taking  . cyclobenzaprine (FLEXERIL) 10 MG tablet Take 1 tablet (10 mg total) by mouth 3 (three) times daily as needed for muscle spasms. 30 tablet 0 04/12/2014  . ondansetron (ZOFRAN-ODT) 8 MG disintegrating tablet Take 1 tablet (8 mg total) by mouth every 12 (twelve) hours. 20 tablet 0 04/12/2014  . pantoprazole (PROTONIX) 40 MG tablet Take 1 tablet (40 mg total) by mouth daily. 30 tablet 3 04/12/2014  . scopolamine (TRANSDERM-SCOP) 1 MG/3DAYS Place 1 patch (1.5 mg total) onto the skin every 3 (three) days. 10 patch 12 04/13/2014   Scheduled: . [START ON 04/16/2014] betamethasone acetate-betamethasone sodium phosphate  12 mg  Intramuscular Q24H  . docusate sodium  100 mg Oral Daily  . magnesium  4 g Intravenous Once  . prenatal multivitamin  1 tablet Oral Q1200    Review of Systems - Negative except light headedness, nausea, vomiting x 1 yesterday, abdominal cramping.  neg dysuria, neg malaise/fevers/sweats/chills, neg headache  Vitals:  BP 124/77 mmHg  Pulse 112  Temp(Src) 98 F (36.7 C) (Oral)  Resp 17  Ht 5' (1.524 m)  Wt 174 lb (78.926 kg)  BMI 33.98 kg/m2  SpO2 98%  LMP 10/03/2013 Physical Examination:  General appearance - alert, well appearing, and in no distress Mental status - alert, oriented to person, place, and time Lymphatics - no palpable lymphadenopathy, no hepatosplenomegaly Heart - no tachycardia Abdomen - soft, nontender, nondistended, no masses or organomegaly Neurological - alert, oriented, normal speech, no  focal findings or movement disorder noted Musculoskeletal - no joint tenderness, deformity or swelling Extremities - peripheral pulses normal, no pedal edema, no clubbing or cyanosis Skin - normal coloration and turgor, no rashes, no suspicious skin lesions noted Abdomen: gravid Pelvic Exam:normal external genitalia, vulva, vagina, cervix, uterus and adnexa Cervix: closed, unable to determine length on exam 2/2 discomfort, soft-moderate consistency Extremities: extremities normal, atraumatic, no cyanosis or edema with DTRs 2+ bilaterally Membranes:intact  Labs:  Results for orders placed or performed during the hospital encounter of 04/15/14 (from the past 24 hour(s))  Urinalysis, Routine w reflex microscopic   Collection Time: 04/15/14  7:00 AM  Result Value Ref Range   Color, Urine YELLOW YELLOW   APPearance CLEAR CLEAR   Specific Gravity, Urine 1.020 1.005 - 1.030   pH 6.0 5.0 - 8.0   Glucose, UA NEGATIVE NEGATIVE mg/dL   Hgb urine dipstick NEGATIVE NEGATIVE   Bilirubin Urine NEGATIVE NEGATIVE   Ketones, ur NEGATIVE NEGATIVE mg/dL   Protein, ur NEGATIVE NEGATIVE  mg/dL   Urobilinogen, UA 0.2 0.0 - 1.0 mg/dL   Nitrite NEGATIVE NEGATIVE   Leukocytes, UA TRACE (A) NEGATIVE  Urine microscopic-add on   Collection Time: 04/15/14  7:00 AM  Result Value Ref Range   Squamous Epithelial / LPF FEW (A) RARE   WBC, UA 3-6 <3 WBC/hpf   RBC / HPF 0-2 <3 RBC/hpf   Bacteria, UA FEW (A) RARE  Fetal fibronectin   Collection Time: 04/15/14  9:00 AM  Result Value Ref Range   Fetal Fibronectin POSITIVE (A) NEGATIVE    Imaging Studies:    Assessment and Plan: Patient Active Problem List   Diagnosis Date Noted  . Preterm labor 04/15/2014  . [redacted] weeks gestation of pregnancy   . Poor fetal growth affecting management of mother in second trimester   . [redacted] weeks gestation of pregnancy   . Decreased amniotic fluid 03/15/2014  . Abnormal fetal ultrasound 03/15/2014  . [redacted] weeks gestation of pregnancy   . [redacted] weeks gestation of pregnancy   . Evaluate anatomy not seen on prior sonogram   . [redacted] weeks gestation of pregnancy   . Abdominal pain affecting pregnancy   . Cervical shortening affecting pregnancy in second trimester, antepartum   . Monochorionic diamniotic twin pregnancy   . Monoamniotic and monochorionic twin gestation in second trimester   . [redacted] weeks gestation of pregnancy   . Twins   . 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 [redacted] weeks gestation of pregnancy   . Vaginal bleeding in pregnancy   . [redacted] weeks gestation of pregnancy   . 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 Monochorionic diamniotic twin gestation 12/25/2013  . Uterine fibroid 12/25/2013  . Obesity 12/25/2013  . Rh negative status during pregnancy 12/25/2013  . Abdominal pain 12/19/2010    Admit for abdominal cramping concerning for preterm labor in setting of +FFN, shortened dynamic cervix from 3.1cm to 0.75cm with funneling - mag x 24h - repeat steroids, 1st dose given >14d ago 1/21 and 1/25 2/2 abdominal tightness in MFM - regular  diet - continuous toco, qshift NST  [facility] fellow [facility], [place]

## 2014-04-15 NOTE — MAU Note (Signed)
Pt presents to MAU with c/o abdominal tightness that does not go away, lightheadedness, nausea, and some mild cramping. Pt states she's had the tightness since Tuesday. The lightheadedness, nausea and cramping started last night. Pt states she vomited one time last night also

## 2014-04-16 ENCOUNTER — Encounter (HOSPITAL_COMMUNITY): Payer: Self-pay | Admitting: *Deleted

## 2014-04-16 ENCOUNTER — Inpatient Hospital Stay (HOSPITAL_COMMUNITY)
Admission: AD | Admit: 2014-04-16 | Discharge: 2014-04-17 | Disposition: A | Discharge status: Home / Self Care | Payer: Medicaid Other | Source: Ambulatory Visit | Attending: Obstetrics & Gynecology | Admitting: Obstetrics & Gynecology

## 2014-04-16 DIAGNOSIS — O479 False labor, unspecified: Secondary | ICD-10-CM

## 2014-04-16 DIAGNOSIS — Z3A28 28 weeks gestation of pregnancy: Secondary | ICD-10-CM | POA: Insufficient documentation

## 2014-04-16 DIAGNOSIS — O3090 Multiple gestation, unspecified, unspecified trimester: Secondary | ICD-10-CM | POA: Insufficient documentation

## 2014-04-16 DIAGNOSIS — R8789 Other abnormal findings in specimens from female genital organs: Secondary | ICD-10-CM

## 2014-04-16 DIAGNOSIS — O09899 Supervision of other high risk pregnancies, unspecified trimester: Secondary | ICD-10-CM

## 2014-04-16 DIAGNOSIS — O3433 Maternal care for cervical incompetence, third trimester: Secondary | ICD-10-CM

## 2014-04-16 DIAGNOSIS — Z3A29 29 weeks gestation of pregnancy: Secondary | ICD-10-CM

## 2014-04-16 DIAGNOSIS — O4703 False labor before 37 completed weeks of gestation, third trimester: Secondary | ICD-10-CM

## 2014-04-16 DIAGNOSIS — O30013 Twin pregnancy, monochorionic/monoamniotic, third trimester: Secondary | ICD-10-CM

## 2014-04-16 LAB — COMPREHENSIVE METABOLIC PANEL
ALT: 15 U/L (ref 0–35)
AST: 25 U/L (ref 0–37)
Albumin: 2.8 g/dL — ABNORMAL LOW (ref 3.5–5.2)
Alkaline Phosphatase: 151 U/L — ABNORMAL HIGH (ref 39–117)
Anion gap: 5 (ref 5–15)
BUN: 7 mg/dL (ref 6–23)
CO2: 17 mmol/L — ABNORMAL LOW (ref 19–32)
Calcium: 8.6 mg/dL (ref 8.4–10.5)
Chloride: 112 mmol/L (ref 96–112)
Creatinine, Ser: 0.57 mg/dL (ref 0.50–1.10)
GFR calc Af Amer: >90 mL/min (ref >90)
GFR calc non Af Amer: >90 mL/min (ref >90)
Glucose, Bld: 136 mg/dL — ABNORMAL HIGH (ref 70–99)
Potassium: 3.7 mmol/L (ref 3.5–5.1)
Sodium: 134 mmol/L — ABNORMAL LOW (ref 135–145)
Total Bilirubin: 0.3 mg/dL (ref 0.3–1.2)
Total Protein: 6.6 g/dL (ref 6.0–8.3)

## 2014-04-16 LAB — URINALYSIS, ROUTINE W REFLEX MICROSCOPIC
Bilirubin Urine: NEGATIVE
Glucose, UA: 250 mg/dL — AB
Hgb urine dipstick: NEGATIVE
Ketones, ur: 15 mg/dL — AB
Leukocytes, UA: NEGATIVE
Nitrite: NEGATIVE
Protein, ur: NEGATIVE mg/dL
Specific Gravity, Urine: 1.025 (ref 1.005–1.030)
Urobilinogen, UA: 0.2 mg/dL (ref 0.0–1.0)
pH: 6 (ref 5.0–8.0)

## 2014-04-16 LAB — URINE CULTURE: Colony Count: 40000

## 2014-04-16 MED ORDER — PROMETHAZINE HCL 25 MG/ML IJ SOLN
12.5000 mg | Freq: Once | INTRAMUSCULAR | Status: DC
  Filled 2014-04-16: qty 1

## 2014-04-16 MED ORDER — PANTOPRAZOLE SODIUM 40 MG PO TBEC: 40.0000 mg | DELAYED_RELEASE_TABLET | Freq: Two times a day (BID) | ORAL | Status: DC

## 2014-04-16 MED ORDER — PROMETHAZINE HCL 25 MG/ML IJ SOLN
12.5000 mg | Freq: Four times a day (QID) | INTRAMUSCULAR | Status: DC | PRN
  Administered 2014-04-16: 12.5 mg via INTRAVENOUS

## 2014-04-16 MED ORDER — ONDANSETRON 4 MG PO TBDP
4.0000 mg | ORAL_TABLET | Freq: Once | ORAL | Status: AC
  Administered 2014-04-16: 4 mg via ORAL
  Filled 2014-04-16: qty 1

## 2014-04-16 NOTE — MAU Provider Note (Signed)
History     CSN: [id number]  Arrival date and time: 04/16/14 2115   First Provider Initiated Contact with Patient 04/16/14 2156      Chief Complaint  Patient presents with  . Abdominal Pain   HPI  Sherri [name] is a 29 y.o. G1P0 at [redacted]w[redacted]d who presents to MAU for evaluation of upper abdominal pain that has been present for over a week. She was admitted to [facility] on the antenatal service for threatened preterm labor and was discharged 5 hours ago. She was evaluated for this abdominal pain was not contracting but was found to have a positive FFN. She was admitted for observation, neuroprotective magnesium, and betamethasone x 2. She had not contractions and no cervical change so she was discharge home. She returns because she continues to feel poorly. Reports upper abdominal pain that feels like a "tightening of her uterus" associated with lightheadedness, nausea, poor PO intake. Denies emesis. She is tearful and very nervous. She is very nervous.   OB History    Gravida Para Term Preterm AB TAB SAB Ectopic Multiple Living   1         0      Past Medical History  Diagnosis Date  . Uterine fibroid     Past Surgical History  Procedure Laterality Date  . No past surgeries      History reviewed. No pertinent family history.  History  Substance Use Topics  . Smoking status: Never Smoker   . Smokeless tobacco: Never Used  . Alcohol Use: No     Comment: occasional    Allergies: No Known Allergies  Prescriptions prior to admission  Medication Sig Dispense Refill Last Dose  . acetaminophen (TYLENOL) 325 MG tablet Take 2 tablets (650 mg total) by mouth every 6 (six) hours as needed for mild pain or headache. 30 tablet 1 Past Week at Unknown time  . cyclobenzaprine (FLEXERIL) 10 MG tablet Take 1 tablet (10 mg total) by mouth 3 (three) times daily as needed for muscle spasms. 30 tablet 0 Past Week at Unknown time  . ondansetron (ZOFRAN-ODT) 8 MG disintegrating tablet  Take 1 tablet (8 mg total) by mouth every 12 (twelve) hours. 20 tablet 0 04/15/2014 at Unknown time  . polyethylene glycol (MIRALAX / GLYCOLAX) packet Take 17 g by mouth daily. 14 each 1 Past Week at Unknown time  . Prenatal Vit-Fe Fumarate-FA (PRENATAL MULTIVITAMIN) TABS tablet Take 1 tablet by mouth daily.    04/16/2014 at Unknown time  . scopolamine (TRANSDERM-SCOP) 1 MG/3DAYS Place 1 patch (1.5 mg total) onto the skin every 3 (three) days. 10 patch 12 04/16/2014 at Unknown time  . bisacodyl (DULCOLAX) 10 MG suppository Place 10 mg rectally as needed for mild constipation or moderate constipation.   Past Week at Unknown time  . pantoprazole (PROTONIX) 40 MG tablet Take 1 tablet (40 mg total) by mouth 2 (two) times daily. (Patient not taking: Reported on 04/16/2014) 50 tablet 3   . sodium chloride (BRONCHO SALINE) inhaler solution Take 1 spray by nebulization as needed. (Patient not taking: Reported on 04/16/2014) 90 mL 12 Past Week at Unknown time    Review of Systems  Constitutional: Negative.  Negative for fever, chills and malaise/fatigue.  HENT: Negative.  Negative for congestion and sore throat.   Eyes: Negative.  Negative for double vision and photophobia.  Respiratory: Negative.  Negative for cough, shortness of breath and wheezing.   Cardiovascular: Negative.  Negative for chest pain and leg  swelling.  Gastrointestinal: Positive for heartburn, nausea and abdominal pain. Negative for vomiting, diarrhea and constipation.  Genitourinary: Negative.  Negative for dysuria, urgency, frequency and hematuria.  Musculoskeletal: Negative.  Negative for myalgias.  Skin: Negative.   Neurological: Negative.  Negative for weakness and headaches.  Psychiatric/Behavioral: The patient is nervous/anxious.   All other systems reviewed and are negative.  Physical Exam   Blood pressure 124/55, pulse 100, resp. rate 18, last menstrual period 10/03/2013.  Physical Exam  Nursing note and vitals  reviewed. Constitutional: She is oriented to person, place, and time. She appears well-developed and well-nourished. No distress.  HENT:  Head: Normocephalic and atraumatic.  Cardiovascular: Normal rate.   Respiratory: Effort normal.  GI: Soft. She exhibits no distension. There is no tenderness. There is no rebound and no guarding.  Gravid   Neurological: She is alert and oriented to person, place, and time.  Skin: Skin is warm and dry.  Psychiatric: She has a normal mood and affect. Her behavior is normal.  SVE: cl/th/hi  MAU Course  Procedures  MDM NST reactive x 2 Baby A: 125/mod/+A/rare variable Baby B: 125/mod/+A/rare variable  CMP     Component Value Date/Time   NA 134* 04/16/2014 2224   K 3.7 04/16/2014 2224   CL 112 04/16/2014 2224   CO2 17* 04/16/2014 2224   GLUCOSE 136* 04/16/2014 2224   BUN 7 04/16/2014 2224   CREATININE 0.57 04/16/2014 2224   CREATININE 0.65 12/19/2010 1030   CALCIUM 8.6 04/16/2014 2224   PROT 6.6 04/16/2014 2224   ALBUMIN 2.8* 04/16/2014 2224   AST 25 04/16/2014 2224   ALT 15 04/16/2014 2224   ALKPHOS 151* 04/16/2014 2224   BILITOT 0.3 04/16/2014 2224   GFRNONAA >90 04/16/2014 2224   GFRAA >90 04/16/2014 2224      Assessment and Plan  Sherri [name] is a 29 y.o. G1P0 at [redacted]w[redacted]d here for evaluation of abdominal pain. Discharged from [facility] earlier today after an admission for threatened preterm labor. No change in the quality or intensity of her abdominal pain. Abdominal examination benign. SVE unchanged. Afebrile with stable vital signs. Tolerated PO challenge. BMP essentially stable from prior.   #Abdominal Pain Likely secondary to [facility] vs discomforts of pregnancy given no contractions on the monitor, unchanged cervical examination, and essentially stable BMP.  - Reassurance provided.  - FM/PTL precautions given.   #Threatened PTL with positive FFN - Discharged from the hospital for this earlier today where she  received BMZ x 2, neuroprotective magnesium. No evidence of PTL today.  #Mildly elevated BG - BG 136, Likely in the setting of receiving betamethasone.  - No history of diabetes or glucose intolerance. 1 hr glucola 82, completed 5 days ago. - CO2 mildly low although remainder of CMP stable. She appears well with stable vital signs, non toxic appearing, tolerating PO.  - Recommend plenty of hydration. - Strict return precautions given.   Follow up in clinic within 3 days for hospital discharge follow up. Reviewed labs and discussed case with Dr. Roselie Awkward who is in agreement with assessment and plan for discharge.   Shelbie Hutching 04/16/2014, 12:14 AM

## 2014-04-16 NOTE — Progress Notes (Signed)
Dc instructions given to pt no questions or concerns noted

## 2014-04-16 NOTE — Discharge Instructions (Signed)
Braxton Hicks Contractions °Contractions of the uterus can occur throughout pregnancy. Contractions are not always a sign that you are in labor.  °WHAT ARE BRAXTON HICKS CONTRACTIONS?  °Contractions that occur before labor are called Braxton Hicks contractions, or false labor. Toward the end of pregnancy (32-34 weeks), these contractions can develop more often and may become more forceful. This is not true labor because these contractions do not result in opening (dilatation) and thinning of the cervix. They are sometimes difficult to tell apart from true labor because these contractions can be forceful and people have different pain tolerances. You should not feel embarrassed if you go to the hospital with false labor. Sometimes, the only way to tell if you are in true labor is for your health care provider to look for changes in the cervix. °If there are no prenatal problems or other health problems associated with the pregnancy, it is completely safe to be sent home with false labor and await the onset of true labor. °HOW CAN YOU TELL THE DIFFERENCE BETWEEN TRUE AND FALSE LABOR? °False Labor °· The contractions of false labor are usually shorter and not as hard as those of true labor.   °· The contractions are usually irregular.   °· The contractions are often felt in the front of the lower abdomen and in the groin.   °· The contractions may go away when you walk around or change positions while lying down.   °· The contractions get weaker and are shorter lasting as time goes on.   °· The contractions do not usually become progressively stronger, regular, and closer together as with true labor.   °True Labor °· Contractions in true labor last 30-70 seconds, become very regular, usually become more intense, and increase in frequency.   °· The contractions do not go away with walking.   °· The discomfort is usually felt in the top of the uterus and spreads to the lower abdomen and low back.   °· True labor can be  determined by your health care provider with an exam. This will show that the cervix is dilating and getting thinner.   °WHAT TO REMEMBER °· Keep up with your usual exercises and follow other instructions given by your health care provider.   °· Take medicines as directed by your health care provider.   °· Keep your regular prenatal appointments.   °· Eat and drink lightly if you think you are going into labor.   °· If Braxton Hicks contractions are making you uncomfortable:   °¨ Change your position from lying down or resting to walking, or from walking to resting.   °¨ Sit and rest in a tub of warm water.   °¨ Drink 2-3 glasses of water. Dehydration may cause these contractions.   °¨ Do slow and deep breathing several times an hour.   °WHEN SHOULD I SEEK IMMEDIATE MEDICAL CARE? °Seek immediate medical care if: °· Your contractions become stronger, more regular, and closer together.   °· You have fluid leaking or gushing from your vagina.   °· You have a fever.   °· You pass blood-tinged mucus.   °· You have vaginal bleeding.   °· You have continuous abdominal pain.   °· You have low back pain that you never had before.   °· You feel your baby's head pushing down and causing pelvic pressure.   °· Your baby is not moving as much as it used to.   °Document Released: 02/16/2005 Document Revised: 02/21/2013 Document Reviewed: 11/28/2012 °[employer]® Patient Information ©2015 [employer]. This information is not intended to replace advice given to you by your health care   provider. Make sure you discuss any questions you have with your health care provider. ° °

## 2014-04-16 NOTE — Discharge Summary (Signed)
Physician Discharge Summary  Patient ID: Sherri [name] MRN: [id number] DOB/AGE: [birth year] 28 y.o.  Admit date: 04/15/2014 Discharge date: 04/16/2014  Admission Diagnoses:Sherri [name] is a 29 y.o. G1P0 at [redacted]w[redacted]d admitted for abdominal cramping concerning for preterm labor in setting of +FFN and dynamic shortened cervix, ranging from 3.1 cm to 0.7 cm of closed cervix. 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 MRN: [id number] FACULTY PRACTICE ANTEPARTUM(COMPREHENSIVE) NOTE   Length of Stay: 1 Days  Subjective: History of Present Illness: Sherri [name] is a 29 y.o. G1P0 at [redacted]w[redacted]d admitted for abdominal cramping concerning for preterm labor in setting of +FFN and dynamic shortened cervix, ranging from 3.1 cm to 0.7 cm of closed cervix. Funneling dilates to 0.7 cm width with Valsalva Patient reports the fetal movement as active. Patient reports uterine contraction activity as rare, uterine irritability noted. Patient reports vaginal bleeding as none. Patient describes fluid per vagina as None. Fetal presentation is Twin A cephalic, twin B transverse. Patient reports the fetal movement as active. Patient reports uterine contraction activity as none. Patient reports vaginal bleeding as none. Patient describes fluid per vagina as None.  Vitals: Blood pressure 123/56, pulse 101, temperature 97.1 F  (36.2 C), temperature source Axillary, resp. rate 20, height 5' (1.524 m), weight 78.926 kg (174 lb), last menstrual period 10/03/2013, SpO2 96 %. Physical Examination: General appearance - alert, well appearing, and in no distress and oriented to person, place, and time Heart - normal rate and regular rhythm Abdomen - soft, nontender, nondistended Fundal Height: size equals dates Cervical Exam: Not evaluated. and  fExtremities: extremities normal, atraumatic, no cyanosis or edema and Homans sign is negative, no sign of DVT with DTRs 2+ bilaterally Membranes:intact  Fetal Monitoring: Baseline: 145 bpm, Variability: Fair (1-6 bpm), Accelerations: Reactive and Decelerations: Absent  Labs:  Results for orders placed or performed during the hospital encounter of 04/15/14 (from the past 24 hour(s))  Fetal fibronectin   Collection Time: 04/15/14 9:00 AM  Result Value Ref Range   Fetal Fibronectin POSITIVE (A) NEGATIVE  CBC on admission   Collection Time: 04/15/14 12:30 PM  Result Value Ref Range   WBC 9.4 4.0 - 10.5 K/uL   RBC 4.01 3.87 - 5.11 MIL/uL   Hemoglobin 11.7 (L) 12.0 - 15.0 g/dL   HCT 35.1 (L) 36.0 - 46.0 %   MCV 87.5 78.0 - 100.0 fL   MCH 29.2 26.0 - 34.0 pg   MCHC 33.3 30.0 - 36.0 g/dL   RDW 15.4 11.5 - 15.5 %   Platelets 201 150 - 400 K/uL  Comprehensive metabolic panel   Collection Time: 04/15/14 12:30 PM  Result Value Ref Range   Sodium 136 135 - 145 mmol/L   Potassium 4.2 3.5 - 5.1 mmol/L   Chloride 111 96 - 112 mmol/L   CO2 20 19 - 32 mmol/L   Glucose, Bld 109 (H) 70 - 99 mg/dL  BUN 6 6 - 23 mg/dL   Creatinine, Ser 0.63 0.50 - 1.10 mg/dL   Calcium 9.1 8.4 - 10.5 mg/dL   Total Protein 6.7 6.0 - 8.3 g/dL   Albumin 3.1 (L) 3.5 - 5.2 g/dL   AST 30 0 - 37 U/L   ALT 16 0 - 35 U/L   Alkaline Phosphatase 186 (H) 39 - 117 U/L   Total Bilirubin 0.7  0.3 - 1.2 mg/dL   GFR calc non Af Amer >90 >90 mL/min   GFR calc Af Amer >90 >90 mL/min   Anion gap 5 5 - 15  Type and screen   Collection Time: 04/15/14 12:30 PM  Result Value Ref Range   ABO/RH(D) A NEG    Antibody Screen POS    Sample Expiration 04/18/2014    DAT, IgG NEG    Antibody Identification PASSIVELY ACQUIRED ANTI-D    Unit Number [id number]    Blood Component Type RED CELLS,LR    Unit division 00    Status of Unit ALLOCATED    Transfusion Status OK TO TRANSFUSE    Crossmatch Result COMPATIBLE    Unit Number [id number]    Blood Component Type RED CELLS,LR    Unit division 00    Status of Unit ALLOCATED    Transfusion Status OK TO TRANSFUSE    Crossmatch Result COMPATIBLE   Protein / creatinine ratio, urine   Collection Time: 04/15/14 5:40 PM  Result Value Ref Range   Creatinine, Urine 30.00 mg/dL   Total Protein, Urine <6 mg/dL   Protein Creatinine Ratio   0.00 - 0.15  Prepare RBC   Collection Time: 04/15/14 6:00 PM  Result Value Ref Range   Order Confirmation ORDER PROCESSED BY BLOOD BANK     Imaging Studies:  Medications: Scheduled . betamethasone acetate-betamethasone sodium phosphate 12 mg Intramuscular Q24H  . docusate sodium 100 mg Oral Daily  . prenatal multivitamin 1 tablet Oral Q1200   I have reviewed the patient's current medications.  ASSESSMENT: Patient Active Problem List   Diagnosis Date Noted  . 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 Positive fetal fibronectin at 22 weeks to [redacted] weeks gestation   . Preterm labor in third trimester without delivery   . [redacted] weeks gestation of pregnancy   . Multiple pregnancy   . Preterm labor 04/15/2014  . [redacted] weeks gestation of pregnancy   . Poor fetal growth affecting management of mother  in second trimester   . [redacted] weeks gestation of pregnancy   . Decreased amniotic fluid 03/15/2014  . Abnormal fetal ultrasound 03/15/2014  . [redacted] weeks gestation of pregnancy   . [redacted] weeks gestation of pregnancy   . Evaluate anatomy not seen on prior sonogram   . [redacted] weeks gestation of pregnancy   . Abdominal pain affecting pregnancy   . Cervical shortening affecting pregnancy in second trimester, antepartum   . Monochorionic diamniotic twin pregnancy   . Monoamniotic and monochorionic twin gestation in second trimester   . [redacted] weeks gestation of pregnancy   . Twins   . 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 [redacted] weeks gestation of pregnancy   . Vaginal bleeding in pregnancy   . [redacted] weeks gestation of pregnancy   . 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 Monochorionic diamniotic twin gestation 12/25/2013  . Uterine fibroid 12/25/2013  . Obesity 12/25/2013  . Rh negative status during pregnancy 12/25/2013  . Abdominal pain  12/19/2010    PLAN: Pt receives her second dose of BMZ today at 10 am Magnesium Sulfate d/c'd this morning, will reassess at noon, if no contractions, will d/c to outpatient Pt to take leave of absence from work, she agrees to this  Continue weekly MFM monitoring of AF volume   FERGUSON,JOHN V 04/16/2014,7:11 AM     No contractions off magnesium , offered discharge follow as outpatient  Consults: None  Significant Diagnostic Studies: radiology: Ultrasound: transvaginal  Treatments: IV hydration, steroids: betamethasone and magnesium sulfate  Discharge Exam: Blood pressure 128/77, pulse 98, temperature 97.4 F (36.3 C), temperature source Axillary, resp. rate 24, height 5' (1.524 m), weight 78.926 kg (174 lb), last menstrual period 10/03/2013, SpO2 96 %. General appearance: alert, cooperative and no distress GI: soft, non-tender; bowel sounds  normal; no masses,  no organomegaly  Disposition: 01-Home or Self Care     Medication List    STOP taking these medications        Acetaminophen-Codeine 300-30 MG per tablet  Commonly known as:  TYLENOL/CODEINE #3      TAKE these medications        acetaminophen 325 MG tablet  Commonly known as:  TYLENOL  Take 2 tablets (650 mg total) by mouth every 6 (six) hours as needed for mild pain or headache.     bisacodyl 10 MG suppository  Commonly known as:  DULCOLAX  Place 10 mg rectally as needed for mild constipation or moderate constipation.     cyclobenzaprine 10 MG tablet  Commonly known as:  FLEXERIL  Take 1 tablet (10 mg total) by mouth 3 (three) times daily as needed for muscle spasms.     ondansetron 8 MG disintegrating tablet  Commonly known as:  ZOFRAN-ODT  Take 1 tablet (8 mg total) by mouth every 12 (twelve) hours.     pantoprazole 40 MG tablet  Commonly known as:  PROTONIX  Take 1 tablet (40 mg total) by mouth 2 (two) times daily.     polyethylene glycol packet  Commonly known as:  MIRALAX / GLYCOLAX  Take 17 g by mouth daily.     scopolamine 1 MG/3DAYS  Commonly known as:  TRANSDERM-SCOP  Place 1 patch (1.5 mg total) onto the skin every 3 (three) days.     sodium chloride inhaler solution  Commonly known as:  BRONCHO SALINE  Take 1 spray by nebulization as needed.      ASK your doctor about these medications        prenatal multivitamin Tabs tablet  Take 1 tablet by mouth daily.           Follow-up Information    Follow up with [facility] In 3 days.   Specialty:  Obstetrics and Gynecology   Contact information:   [place] [phone]      Signed: Emeterio Reeve 04/16/2014, 12:51 PM

## 2014-04-16 NOTE — Progress Notes (Signed)
Patient ID: Sherri [name], female   DOB: Jul 24, 1985, 29 y.o.   MRN: [id number] FACULTY PRACTICE ANTEPARTUM(COMPREHENSIVE) NOTE   Length of Stay:  1  Days  Subjective: History of Present Illness: Sherri [name] is a 29 y.o. G1P0 at [redacted]w[redacted]d admitted for abdominal cramping concerning for preterm labor in setting of +FFN and dynamic shortened cervix, ranging from 3.1 cm to 0.7 cm of closed cervix. Funneling dilates to 0.7 cm width with Valsalva Patient reports the fetal movement as active. Patient reports uterine contraction activity as rare, uterine irritability noted. Patient reports vaginal bleeding as none. Patient describes fluid per vagina as None. Fetal presentation is Twin A cephalic, twin B transverse. Patient reports the fetal movement as active. Patient reports uterine contraction  activity as none. Patient reports  vaginal bleeding as none. Patient describes fluid per vagina as None.  Vitals:  Blood pressure 123/56, pulse 101, temperature 97.1 F (36.2 C), temperature source Axillary, resp. rate 20, height 5' (1.524 m), weight 78.926 kg (174 lb), last menstrual period 10/03/2013, SpO2 96 %. Physical Examination:  General appearance - alert, well appearing, and in no distress and oriented to person, place, and time Heart - normal rate and regular rhythm Abdomen - soft, nontender, nondistended Fundal Height:  size equals dates Cervical Exam: Not evaluated. and  fExtremities: extremities normal, atraumatic, no cyanosis or edema and Homans sign is negative, no sign of DVT with DTRs 2+ bilaterally Membranes:intact  Fetal Monitoring:  Baseline: 145 bpm, Variability: Fair (1-6 bpm), Accelerations: Reactive and Decelerations: Absent  Labs:  Results for orders placed or performed during the hospital encounter of 04/15/14 (from the past 24 hour(s))  Fetal fibronectin   Collection Time: 04/15/14  9:00 AM  Result Value Ref Range   Fetal Fibronectin POSITIVE (A) NEGATIVE  CBC  on admission   Collection Time: 04/15/14 12:30 PM  Result Value Ref Range   WBC 9.4 4.0 - 10.5 K/uL   RBC 4.01 3.87 - 5.11 MIL/uL   Hemoglobin 11.7 (L) 12.0 - 15.0 g/dL   HCT 35.1 (L) 36.0 - 46.0 %   MCV 87.5 78.0 - 100.0 fL   MCH 29.2 26.0 - 34.0 pg   MCHC 33.3 30.0 - 36.0 g/dL   RDW 15.4 11.5 - 15.5 %   Platelets 201 150 - 400 K/uL  Comprehensive metabolic panel   Collection Time: 04/15/14 12:30 PM  Result Value Ref Range   Sodium 136 135 - 145 mmol/L   Potassium 4.2 3.5 - 5.1 mmol/L   Chloride 111 96 - 112 mmol/L   CO2 20 19 - 32 mmol/L   Glucose, Bld 109 (H) 70 - 99 mg/dL   BUN 6 6 - 23 mg/dL   Creatinine, Ser 0.63 0.50 - 1.10 mg/dL   Calcium 9.1 8.4 - 10.5 mg/dL   Total Protein 6.7 6.0 - 8.3 g/dL   Albumin 3.1 (L) 3.5 - 5.2 g/dL   AST 30 0 - 37 U/L   ALT 16 0 - 35 U/L   Alkaline Phosphatase 186 (H) 39 - 117 U/L   Total Bilirubin 0.7 0.3 - 1.2 mg/dL   GFR calc non Af Amer >90 >90 mL/min   GFR calc Af Amer >90 >90 mL/min   Anion gap 5 5 - 15  Type and screen   Collection Time: 04/15/14 12:30 PM  Result Value Ref Range   ABO/RH(D) A NEG    Antibody Screen POS    Sample Expiration 04/18/2014    DAT, IgG NEG  Antibody Identification PASSIVELY ACQUIRED ANTI-D    Unit Number [id number]    Blood Component Type RED CELLS,LR    Unit division 00    Status of Unit ALLOCATED    Transfusion Status OK TO TRANSFUSE    Crossmatch Result COMPATIBLE    Unit Number [id number]    Blood Component Type RED CELLS,LR    Unit division 00    Status of Unit ALLOCATED    Transfusion Status OK TO TRANSFUSE    Crossmatch Result COMPATIBLE   Protein / creatinine ratio, urine   Collection Time: 04/15/14  5:40 PM  Result Value Ref Range   Creatinine, Urine 30.00 mg/dL   Total Protein, Urine <6 mg/dL   Protein Creatinine Ratio        0.00 - 0.15  Prepare RBC   Collection Time: 04/15/14  6:00 PM  Result Value Ref Range   Order Confirmation ORDER PROCESSED BY BLOOD BANK      Imaging Studies:    Medications:  Scheduled . betamethasone acetate-betamethasone sodium phosphate  12 mg Intramuscular Q24H  . docusate sodium  100 mg Oral Daily  . prenatal multivitamin  1 tablet Oral Q1200   I have reviewed the patient's current medications.  ASSESSMENT: Patient Active Problem List   Diagnosis Date Noted  . 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 Positive fetal fibronectin at 22 weeks to [redacted] weeks gestation   . Preterm labor in third trimester without delivery   . [redacted] weeks gestation of pregnancy   . Multiple pregnancy   . Preterm labor 04/15/2014  . [redacted] weeks gestation of pregnancy   . Poor fetal growth affecting management of mother in second trimester   . [redacted] weeks gestation of pregnancy   . Decreased amniotic fluid 03/15/2014  . Abnormal fetal ultrasound 03/15/2014  . [redacted] weeks gestation of pregnancy   . [redacted] weeks gestation of pregnancy   . Evaluate anatomy not seen on prior sonogram   . [redacted] weeks gestation of pregnancy   . Abdominal pain affecting pregnancy   . Cervical shortening affecting pregnancy in second trimester, antepartum   . Monochorionic diamniotic twin pregnancy   . Monoamniotic and monochorionic twin gestation in second trimester   . [redacted] weeks gestation of pregnancy   . Twins   . 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 [redacted] weeks gestation of pregnancy   . Vaginal bleeding in pregnancy   . [redacted] weeks gestation of pregnancy   . 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 Monochorionic diamniotic twin gestation 12/25/2013  . Uterine fibroid 12/25/2013  . Obesity 12/25/2013  . Rh negative status during pregnancy 12/25/2013  . Abdominal pain 12/19/2010    PLAN: Pt receives her second dose of BMZ today at 10 am Magnesium Sulfate d/c'd this morning, will reassess at noon, if no contractions, will d/c to outpatient Pt to take leave of absence from work, she  agrees to this  Continue weekly MFM monitoring of AF volume   Sergei Delo V 04/16/2014,7:11 AM

## 2014-04-16 NOTE — Discharge Instructions (Signed)
Multiple Pregnancy A multiple pregnancy is when a woman is pregnant with two or more fetuses. Multiple pregnancies occur in about 3% of all births. The more babies in a pregnancy, the greater the risks of problems to the babies and mother. This includes death. Since the use of Assisted Reproductive Technology (ART) and medications that can induce ovulation, multiple fetal gestation has increased.  RISKS TO THE MOTHER  Preeclampsia and eclampsia.  Postpartum bleeding (hemorrhage).  Kidney infection (pyelonephritis).  Develop anemia.  Develop diabetes.  Liver complications.  A blood clot blocks the artery, or branch of the artery leading to the lungs (pulmonary embolism).  Blood clots in the leg.  Placental separation.  Higher rate of Cesarean Section deliveries.  Women over 64 years old have a higher rate of Downs Syndrome babies. RISKS TO THE BABY  Preterm labor with a premature baby.  Very low birth weight babies that are less than 3 pounds, especially with triplets or mores.  Premature rupture of the membranes.  Twin to twin blood transfusion with one baby anemic and the other baby with too much blood in its system. There may also be heart failure.  With triplets or more, one of the babies is at high risk for cerebral palsy or other neurologic problem.  There is a higher incidence of fetal death. CARE OF MOTHERS WITH MULTIPLE FETAL GESTATION Multiple pregnancies need more care and special prenatal care.  You will see your caregiver more often.  You will have more tests including ultrasounds, nonstress tests and blood tests.  You will have special tests done called amniocentesis and a biophysical profile.  You may be hospitalized more often during the pregnancy.  You will be encouraged to eat a balanced and healthy diet with vitamin and mineral supplements as directed.  You will be asked to get more rest and sleep to keep up your energy.  You will be asked to  restrict your daily activities, exercise, work, household chores and sexual activity.  If you have preterm labor with small babies, you will be given a steroid injection to help the babies lungs mature and do better when born.  The delivery may have to be by Cesarean delivery, especially if there are triplets or more.  The delivery should be in a hospital with an intensive care nursery and Neonatologists (pediatrician for high risk babies) to care for the newborn babies. HOME CARE INSTRUCTIONS   Follow the caregiver's recommendations regarding office visits, tests for you and the babies, diet, rest and medications.  Avoid a large amount of physical activity.  Arrange to have help after the babies are born and when you go home from the hospital.  Take classes on how to care for multiple babies before you deliver them. SEEK IMMEDIATE MEDICAL CARE IF:   You develop a temperature of 102 F (38.9 C) or higher.  You are leaking fluid from the vagina.  You develop vaginal bleeding.  You develop uterine contractions.  You develop a severe headache, severe upper abdominal pain, visual problems or excessive swelling of your face, hands and feet.  You develop severe back pain or leg pain.  You develop severe tiredness.  You develop chest pain.  You have shortness of breath, fall down or pass out. Document Released: 11/26/2007 Document Revised: 05/11/2011 Document Reviewed: 01/20/2013 [facility] Patient Information 2015 [place], [place]. This information is not intended to replace advice given to you by your health care provider. Make sure you discuss any questions you have with  your health care provider.

## 2014-04-16 NOTE — MAU Note (Signed)
Pt was seen in MAU last night and stated that she was diagnosed with preterm labor and [address]. Pt states that upper abdominal pain began today and has not stopped, is constant. Pt states that babies are active and denies leaking of fluid and bleeding.

## 2014-04-16 NOTE — Progress Notes (Signed)
Medicated for nausea no vomiting noted

## 2014-04-17 ENCOUNTER — Observation Stay (HOSPITAL_COMMUNITY): Payer: Medicaid Other

## 2014-04-17 ENCOUNTER — Observation Stay (HOSPITAL_BASED_OUTPATIENT_CLINIC_OR_DEPARTMENT_OTHER)
Admission: AD | Admit: 2014-04-17 | Discharge: 2014-04-17 | Disposition: A | Discharge status: Home / Self Care | Payer: Medicaid Other | Source: Ambulatory Visit | Attending: Obstetrics & Gynecology | Admitting: Obstetrics & Gynecology

## 2014-04-17 ENCOUNTER — Encounter (HOSPITAL_COMMUNITY): Payer: Self-pay | Admitting: *Deleted

## 2014-04-17 ENCOUNTER — Ambulatory Visit (HOSPITAL_COMMUNITY): Payer: Medicaid Other

## 2014-04-17 DIAGNOSIS — O9989 Other specified diseases and conditions complicating pregnancy, childbirth and the puerperium: Secondary | ICD-10-CM

## 2014-04-17 DIAGNOSIS — O283 Abnormal ultrasonic finding on antenatal screening of mother: Secondary | ICD-10-CM

## 2014-04-17 DIAGNOSIS — Z3A28 28 weeks gestation of pregnancy: Secondary | ICD-10-CM

## 2014-04-17 DIAGNOSIS — R1013 Epigastric pain: Secondary | ICD-10-CM

## 2014-04-17 DIAGNOSIS — O4103X1 Oligohydramnios, third trimester, fetus 1: Secondary | ICD-10-CM

## 2014-04-17 LAB — TYPE AND SCREEN
ABO/RH(D): A NEG
Antibody Screen: POSITIVE
DAT, IgG: NEGATIVE
Unit division: 0
Unit division: 0

## 2014-04-17 LAB — COMPREHENSIVE METABOLIC PANEL
ALT: 15 U/L (ref 0–35)
AST: 19 U/L (ref 0–37)
Albumin: 2.7 g/dL — ABNORMAL LOW (ref 3.5–5.2)
Alkaline Phosphatase: 140 U/L — ABNORMAL HIGH (ref 39–117)
Anion gap: 2 — ABNORMAL LOW (ref 5–15)
BUN: 6 mg/dL (ref 6–23)
CO2: 22 mmol/L (ref 19–32)
Calcium: 8.5 mg/dL (ref 8.4–10.5)
Chloride: 111 mmol/L (ref 96–112)
Creatinine, Ser: 0.5 mg/dL (ref 0.50–1.10)
GFR calc Af Amer: >90 mL/min (ref >90)
GFR calc non Af Amer: >90 mL/min (ref >90)
Glucose, Bld: 97 mg/dL (ref 70–99)
Potassium: 3.5 mmol/L (ref 3.5–5.1)
Sodium: 135 mmol/L (ref 135–145)
Total Bilirubin: 0.4 mg/dL (ref 0.3–1.2)
Total Protein: 6.4 g/dL (ref 6.0–8.3)

## 2014-04-17 LAB — CBC
HCT: 30.5 % — ABNORMAL LOW (ref 36.0–46.0)
Hemoglobin: 10 g/dL — ABNORMAL LOW (ref 12.0–15.0)
MCH: 28.8 pg (ref 26.0–34.0)
MCHC: 32.8 g/dL (ref 30.0–36.0)
MCV: 87.9 fL (ref 78.0–100.0)
Platelets: 188 10*3/uL (ref 150–400)
RBC: 3.47 MIL/uL — ABNORMAL LOW (ref 3.87–5.11)
RDW: 15.8 % — ABNORMAL HIGH (ref 11.5–15.5)
WBC: 16.5 10*3/uL — ABNORMAL HIGH (ref 4.0–10.5)

## 2014-04-17 LAB — LIPASE, BLOOD: Lipase: 29 U/L (ref 11–59)

## 2014-04-17 MED ORDER — SODIUM CHLORIDE 0.45 % IV SOLN
INTRAVENOUS | Status: DC
  Administered 2014-04-17 (×2): via INTRAVENOUS

## 2014-04-17 MED ORDER — CALCIUM CARBONATE ANTACID 500 MG PO CHEW: 2.0000 | CHEWABLE_TABLET | ORAL | Status: DC | PRN

## 2014-04-17 MED ORDER — PRENATAL MULTIVITAMIN CH: 1.0000 | ORAL_TABLET | Freq: Every day | ORAL | Status: DC

## 2014-04-17 MED ORDER — ZOLPIDEM TARTRATE 5 MG PO TABS: 5.0000 mg | ORAL_TABLET | Freq: Every evening | ORAL | Status: DC | PRN

## 2014-04-17 MED ORDER — ACETAMINOPHEN 500 MG PO TABS
1000.0000 mg | ORAL_TABLET | Freq: Once | ORAL | Status: AC
  Administered 2014-04-17: 1000 mg via ORAL
  Filled 2014-04-17: qty 2

## 2014-04-17 MED ORDER — LACTATED RINGERS IV BOLUS (SEPSIS)
1000.0000 mL | Freq: Once | INTRAVENOUS | Status: AC
  Administered 2014-04-17 (×2): 1000 mL via INTRAVENOUS

## 2014-04-17 MED ORDER — ONDANSETRON 4 MG PO TBDP: 4.0000 mg | ORAL_TABLET | Freq: Once | ORAL | Status: DC

## 2014-04-17 MED ORDER — PRENATAL MULTIVITAMIN CH
1.0000 | ORAL_TABLET | Freq: Every day | ORAL | Status: DC
  Administered 2014-04-17: 1 via ORAL
  Filled 2014-04-17: qty 1

## 2014-04-17 MED ORDER — BISACODYL 10 MG RE SUPP
10.0000 mg | RECTAL | Status: DC | PRN
  Filled 2014-04-17: qty 1

## 2014-04-17 MED ORDER — SCOPOLAMINE 1 MG/3DAYS TD PT72
1.0000 | MEDICATED_PATCH | TRANSDERMAL | Status: DC
  Administered 2014-04-17: 1.5 mg via TRANSDERMAL
  Filled 2014-04-17: qty 1

## 2014-04-17 MED ORDER — ACETAMINOPHEN 325 MG PO TABS: 650.0000 mg | ORAL_TABLET | ORAL | Status: DC | PRN

## 2014-04-17 MED ORDER — RANITIDINE HCL 300 MG PO TABS: 300.0000 mg | ORAL_TABLET | Freq: Every day | ORAL | Status: DC

## 2014-04-17 MED ORDER — ONDANSETRON HCL 4 MG/2ML IJ SOLN
4.0000 mg | Freq: Once | INTRAMUSCULAR | Status: AC
  Administered 2014-04-17: 4 mg via INTRAVENOUS
  Filled 2014-04-17: qty 2

## 2014-04-17 MED ORDER — DOCUSATE SODIUM 100 MG PO CAPS
100.0000 mg | ORAL_CAPSULE | Freq: Every day | ORAL | Status: DC
Start: 2014-04-17 — End: 2014-04-17
  Administered 2014-04-17: 100 mg via ORAL
  Filled 2014-04-17: qty 1

## 2014-04-17 MED ORDER — ONDANSETRON 4 MG PO TBDP: 4.0000 mg | ORAL_TABLET | Freq: Three times a day (TID) | ORAL | Status: DC | PRN

## 2014-04-17 MED ORDER — CYCLOBENZAPRINE HCL 10 MG PO TABS: 10.0000 mg | ORAL_TABLET | Freq: Three times a day (TID) | ORAL | Status: DC | PRN

## 2014-04-17 MED ORDER — POLYETHYLENE GLYCOL 3350 17 G PO PACK
17.0000 g | PACK | Freq: Every day | ORAL | Status: DC
  Administered 2014-04-17: 17 g via ORAL
  Filled 2014-04-17: qty 1

## 2014-04-17 MED ORDER — ACETAMINOPHEN 325 MG PO TABS: 650.0000 mg | ORAL_TABLET | Freq: Four times a day (QID) | ORAL | Status: DC | PRN

## 2014-04-17 MED ORDER — PANTOPRAZOLE SODIUM 40 MG PO TBEC
40.0000 mg | DELAYED_RELEASE_TABLET | Freq: Two times a day (BID) | ORAL | Status: DC
  Administered 2014-04-17: 40 mg via ORAL
  Filled 2014-04-17: qty 1

## 2014-04-17 NOTE — Discharge Instructions (Signed)
Heartburn During Pregnancy  °Heartburn is a burning sensation in the chest caused by stomach acid backing up into the esophagus. Heartburn is common in pregnancy because a certain hormone (progesterone) is released when a woman is pregnant. The progesterone hormone may relax the valve that separates the esophagus from the stomach. This allows acid to go up into the esophagus, causing heartburn. Heartburn may also happen in pregnancy because the enlarging uterus pushes up on the stomach, which pushes more acid into the esophagus. This is especially true in the later stages of pregnancy. Heartburn problems usually go away after giving birth. °CAUSES  °Heartburn is caused by stomach acid backing up into the esophagus. During pregnancy, this may result from various things, including:  °· The progesterone hormone. °· Changing hormone levels. °· The growing uterus pushing stomach acid upward. °· Large meals. °· Certain foods and drinks. °· Exercise. °· Increased acid production. °SIGNS AND SYMPTOMS  °· Burning pain in the chest or lower throat. °· Bitter taste in the mouth. °· Coughing. °DIAGNOSIS  °Your health care provider will typically diagnose heartburn by taking a careful history of your concern. Blood tests may be done to check for a certain type of bacteria that is associated with heartburn. Sometimes, heartburn is diagnosed by prescribing a heartburn medicine to see if the symptoms improve. In some cases, a procedure called an endoscopy may be done. In this procedure, a tube with a light and a camera on the end (endoscope) is used to examine the esophagus and the stomach. °TREATMENT  °Treatment will vary depending on the severity of your symptoms. Your health care provider may recommend: °· Over-the-counter medicines (antacids, acid reducers) for mild heartburn. °· Prescription medicines to decrease stomach acid or to protect your stomach lining. °· Certain changes in your diet. °· Elevating the head of your bed  by putting blocks under the legs. This helps prevent stomach acid from backing up into the esophagus when you are lying down. °HOME CARE INSTRUCTIONS  °· Only take over-the-counter or prescription medicines as directed by your health care provider. °· Raise the head of your bed by putting blocks under the legs if instructed to do so by your health care provider. Sleeping with more pillows is not effective because it only changes the position of your head. °· Do not exercise right after eating. °· Avoid eating 2-3 hours before bed. Do not lie down right after eating. °· Eat small meals throughout the day instead of three large meals. °· Identify foods and beverages that make your symptoms worse and avoid them. Foods you may want to avoid include: °¨ Peppers. °¨ Chocolate. °¨ High-fat foods, including fried foods. °¨ Spicy foods. °¨ Garlic and onions. °¨ Citrus fruits, including oranges, grapefruit, lemons, and limes. °¨ Food containing tomatoes or tomato products. °¨ Mint. °¨ Carbonated and caffeinated drinks. °¨ Vinegar. °SEEK MEDICAL CARE IF: °· You have abdominal pain of any kind. °· You feel burning in your upper abdomen or chest, especially after eating or lying down. °· You have nausea and vomiting. °· Your stomach feels upset after you eat. °SEEK IMMEDIATE MEDICAL CARE IF:  °· You have severe chest pain that goes down your arm or into your jaw or neck. °· You feel sweaty, dizzy, or light-headed. °· You become short of breath. °· You vomit blood. °· You have difficulty or pain with swallowing. °· You have bloody or black, tarry stools. °· You have episodes of heartburn more than 3 times a   week, for more than 2 weeks. °MAKE SURE YOU: °· Understand these instructions. °· Will watch your condition. °· Will get help right away if you are not doing well or get worse. °Document Released: 02/14/2000 Document Revised: 02/21/2013 Document Reviewed: 10/05/2012 °[employer]® Patient Information ©2015 [employer]. This  information is not intended to replace advice given to you by your health care provider. Make sure you discuss any questions you have with your health care provider. ° °

## 2014-04-17 NOTE — Discharge Summary (Signed)
Physician Discharge Summary  Patient ID: Sherri [name] MRN: [id number] DOB/AGE: [birth year] 29 y.o.  Admit date: 04/17/2014 Discharge date: 04/17/2014  Admission Diagnoses:  1.  Epigastril abdominal Pain Discharge Diagnoses:  Active Problems:   Epigastric abdominal pain   Discharged Condition: stable  Hospital Course: Patient was admitted for continued abdominal pain.  The patient was observed for 14 hours.  No obstetrical reason for abdominal pain was noted.  On repeat exam, the pain appears to be epigastric in nature, similar to a gastritis.  Patient's pain continues, but patient otherwise stable - will treat as outpatient with increased regimen of PPI and H2 blockers.  H pylori antibody pending.  Consults: None  Significant Diagnostic Studies: labs: CMP and lipase normal  Treatments: PPI, H2 blockers  Discharge Exam: Blood pressure 134/60, pulse 95, temperature 98 F (36.7 C), temperature source Oral, resp. rate 18, height 5' (1.524 m), weight 174 lb (78.926 kg), last menstrual period 10/03/2013, SpO2 97 %. General appearance: alert, cooperative and no distress Resp: clear to auscultation bilaterally Cardio: regular rate and rhythm, S1, S2 normal, no murmur, click, rub or gallop GI: soft, tender in epigastric region.  Uterus nontender.  No guarding or rebound  Disposition: 01-Home or Self Care  Discharge Instructions    Discharge patient    Complete by:  As directed             Medication List    TAKE these medications        acetaminophen 325 MG tablet  Commonly known as:  TYLENOL  Take 2 tablets (650 mg total) by mouth every 6 (six) hours as needed for mild pain or headache.     bisacodyl 10 MG suppository  Commonly known as:  DULCOLAX  Place 10 mg rectally as needed for moderate constipation.     cyclobenzaprine 10 MG tablet  Commonly known as:  FLEXERIL  Take 1 tablet (10 mg total) by mouth 3 (three) times daily as needed for muscle spasms.     ondansetron 8 MG disintegrating tablet  Commonly known as:  ZOFRAN-ODT  Take 1 tablet (8 mg total) by mouth every 12 (twelve) hours.     pantoprazole 40 MG tablet  Commonly known as:  PROTONIX  Take 1 tablet (40 mg total) by mouth 2 (two) times daily.     polyethylene glycol packet  Commonly known as:  MIRALAX / GLYCOLAX  Take 17 g by mouth daily.     prenatal multivitamin Tabs tablet  Take 1 tablet by mouth daily.     ranitidine 300 MG tablet  Commonly known as:  ZANTAC  Take 1 tablet (300 mg total) by mouth at bedtime.     scopolamine 1 MG/3DAYS  Commonly known as:  TRANSDERM-SCOP  Place 1 patch (1.5 mg total) onto the skin every 3 (three) days.     sodium chloride inhaler solution  Commonly known as:  BRONCHO SALINE  Take 1 spray by nebulization as needed.           Follow-up Information    Follow up with [facility] On 04/24/2014.   Contact information:   [address] [place] [phone]      Signed: Loma [place] JEHIEL 04/17/2014, 5:17 PM

## 2014-04-17 NOTE — H&P (Signed)
History of Present Illness: Sherri [name] is a 29 y.o. G1P0 at [redacted]w[redacted]d admitted for abdominal cramping. She was discharged from [facility] yesterday after an admission for threatened preterm labor where she received two doses of betamethasone and 12 hours of neuro protective magnesium. She had a positive FFN although no contractions and no cervical dilation so she was ultimately discharged to home after completion of these interventions. She was discharged less than 12 hours ago and this is her second time returning to the MAU since discharge. She complains of epigastric abdominal pain, nausea, and headache. She reports no change in her symptoms from prior. She was unable to sleep when she returned home. Denies fevers, chills, chest pain, shortness of breath, vomiting, changes in bowel habits.   Patient reports the fetal movement as active. Patient reports uterine contraction  activity as none. Patient reports  vaginal bleeding as none. Patient describes fluid per vagina as None.  Fetal presentation is Twin A cephalic, Twin B transverse (ultrasound two days ago)  Patient Active Problem List   Diagnosis Date Noted  . Epigastric abdominal pain 04/17/2014  . 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 Positive fetal fibronectin at 22 weeks to [redacted] weeks gestation   . Preterm labor in third trimester without delivery   . [redacted] weeks gestation of pregnancy   . Multiple pregnancy   . Preterm labor 04/15/2014  . [redacted] weeks gestation of pregnancy   . Poor fetal growth affecting management of mother in second trimester   . [redacted] weeks gestation of pregnancy   . Decreased amniotic fluid 03/15/2014  . Abnormal fetal ultrasound 03/15/2014  . [redacted] weeks gestation of pregnancy   . [redacted] weeks gestation of pregnancy   . Evaluate anatomy not seen on prior sonogram   . [redacted] weeks gestation of pregnancy   . Abdominal pain affecting pregnancy   . Cervical shortening  affecting pregnancy in second trimester, antepartum   . Monochorionic diamniotic twin pregnancy   . Monoamniotic and monochorionic twin gestation in second trimester   . [redacted] weeks gestation of pregnancy   . Twins   . 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 [redacted] weeks gestation of pregnancy   . Vaginal bleeding in pregnancy   . [redacted] weeks gestation of pregnancy   . 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 Monochorionic diamniotic twin gestation 12/25/2013  . Uterine fibroid 12/25/2013  . Obesity 12/25/2013  . Rh negative status during pregnancy 12/25/2013  . Abdominal pain 12/19/2010   Past Medical History: Past Medical History  Diagnosis Date  . Uterine fibroid     Past Surgical History: Past Surgical History  Procedure Laterality Date  . No past surgeries      Obstetrical History: OB History    Gravida Para Term Preterm AB TAB SAB Ectopic Multiple Living   1         0      Gynecological History: negative  Social History: History   Social History  . Marital Status: Single    Spouse Name: N/A  . Number of Children: N/A  . Years of Education: N/A   Social History Main Topics  . Smoking status: Never Smoker   . Smokeless tobacco: Never Used  . Alcohol Use: No     Comment: occasional  . Drug Use: No  . Sexual Activity: Yes    Birth Control/ Protection: None   Other Topics Concern  .  None   Social History Narrative    Family History: No family history on file.  Allergies: No Known Allergies  Prescriptions prior to admission  Medication Sig Dispense Refill Last Dose  . acetaminophen (TYLENOL) 325 MG tablet Take 2 tablets (650 mg total) by mouth every 6 (six) hours as needed for mild pain or headache. 30 tablet 1 Past Week at Unknown time  . bisacodyl (DULCOLAX) 10 MG suppository Place 10 mg rectally as needed for mild constipation or moderate constipation.   Past Week at Unknown time  . cyclobenzaprine  (FLEXERIL) 10 MG tablet Take 1 tablet (10 mg total) by mouth 3 (three) times daily as needed for muscle spasms. 30 tablet 0 Past Week at Unknown time  . ondansetron (ZOFRAN-ODT) 8 MG disintegrating tablet Take 1 tablet (8 mg total) by mouth every 12 (twelve) hours. 20 tablet 0 04/15/2014 at Unknown time  . pantoprazole (PROTONIX) 40 MG tablet Take 1 tablet (40 mg total) by mouth 2 (two) times daily. (Patient not taking: Reported on 04/16/2014) 50 tablet 3   . polyethylene glycol (MIRALAX / GLYCOLAX) packet Take 17 g by mouth daily. 14 each 1 Past Week at Unknown time  . Prenatal Vit-Fe Fumarate-FA (PRENATAL MULTIVITAMIN) TABS tablet Take 1 tablet by mouth daily.    04/16/2014 at Unknown time  . scopolamine (TRANSDERM-SCOP) 1 MG/3DAYS Place 1 patch (1.5 mg total) onto the skin every 3 (three) days. 10 patch 12 04/16/2014 at Unknown time  . sodium chloride (BRONCHO SALINE) inhaler solution Take 1 spray by nebulization as needed. (Patient not taking: Reported on 04/16/2014) 90 mL 12 Past Week at Unknown time    Review of Systems  Review of Systems  Constitutional: Negative for fever, chills, weight loss, malaise/fatigue and diaphoresis.  HENT: Negative for hearing loss, ear pain, nosebleeds, congestion, sore throat, neck pain, tinnitus and ear discharge.   Eyes: Negative for blurred vision, double vision, photophobia, pain, discharge and redness.  Respiratory: Negative for cough, hemoptysis, sputum production, shortness of breath, wheezing and stridor.   Cardiovascular: Negative for chest pain, palpitations, orthopnea, claudication, leg swelling and PND.  Gastrointestinal:negative for abdominal pain Negative for heartburn, nausea, vomiting, diarrhea, constipation, blood in stool and melena.  Genitourinary: Negative for dysuria, urgency, frequency, hematuria and flank pain.  Musculoskeletal: Negative for myalgias, back pain, joint pain and falls.  Skin: Negative for itching and rash.  Neurological:  Negative for dizziness, tingling, tremors, sensory change, speech change, focal weakness, seizures, loss of consciousness, weakness and headaches.  Endo/Heme/Allergies: Negative for environmental allergies and polydipsia. Does not bruise/bleed easily.  Psychiatric/Behavioral: Negative for depression, suicidal ideas, hallucinations, memory loss and substance abuse. The patient is not nervous/anxious and does not have insomnia.     Vitals:  Last menstrual period 10/03/2013. Physical Examination:  General appearance - alert, well appearing, and in no distress and anxious Abdomen: gravid and mild epigastric tenderness, no fundal tenderness, no rebound or guarding and fundal height  is consistent with twins Pelvic Exam:exam wnl, completed during last mau visit two hours ago Cervix: Dilation: 0cm, Thickness: 3 cm and high (checked during last mau visit several hours ago) Extremities: extremities normal, atraumatic, no cyanosis or edema with DTRs 2+ bilaterally Membranes:intact Fetal Monitoring: - Baby A: 125/mod/+A/rare variable - Baby B: 125/mod/+A/rare variable  Labs:  Results for orders placed or performed during the hospital encounter of 04/16/14 (from the past 24 hour(s))  Urinalysis, Routine w reflex microscopic   Collection Time: 04/16/14  9:25 PM  Result Value Ref  Range   Color, Urine YELLOW YELLOW   APPearance CLEAR CLEAR   Specific Gravity, Urine 1.025 1.005 - 1.030   pH 6.0 5.0 - 8.0   Glucose, UA 250 (A) NEGATIVE mg/dL   Hgb urine dipstick NEGATIVE NEGATIVE   Bilirubin Urine NEGATIVE NEGATIVE   Ketones, ur 15 (A) NEGATIVE mg/dL   Protein, ur NEGATIVE NEGATIVE mg/dL   Urobilinogen, UA 0.2 0.0 - 1.0 mg/dL   Nitrite NEGATIVE NEGATIVE   Leukocytes, UA NEGATIVE NEGATIVE  Comprehensive metabolic panel   Collection Time: 04/16/14 10:24 PM  Result Value Ref Range   Sodium 134 (L) 135 - 145 mmol/L   Potassium 3.7 3.5 - 5.1 mmol/L   Chloride 112 96 - 112 mmol/L   CO2 17 (L) 19 -  32 mmol/L   Glucose, Bld 136 (H) 70 - 99 mg/dL   BUN 7 6 - 23 mg/dL   Creatinine, Ser 0.57 0.50 - 1.10 mg/dL   Calcium 8.6 8.4 - 10.5 mg/dL   Total Protein 6.6 6.0 - 8.3 g/dL   Albumin 2.8 (L) 3.5 - 5.2 g/dL   AST 25 0 - 37 U/L   ALT 15 0 - 35 U/L   Alkaline Phosphatase 151 (H) 39 - 117 U/L   Total Bilirubin 0.3 0.3 - 1.2 mg/dL   GFR calc non Af Amer >90 >90 mL/min   GFR calc Af Amer >90 >90 mL/min   Anion gap 5 5 - 15    Imaging Studies: US Ob Limited  03/20/2014   OBSTETRICAL ULTRASOUND: This exam was performed within a [place] [facility] Department. The OB US report was generated in the AS system, and faxed to the ordering physician.   This report is available in the [facility]. See the AS Obstetric US report via the Image Link.  US Ob Follow Up  03/29/2014   OBSTETRICAL ULTRASOUND: This exam was performed within a [place] [facility] Department. The OB US report was generated in the AS system, and faxed to the ordering physician.   This report is available in the [facility]. See the AS Obstetric US report via the Image Link.  US Fetal Bpp W/o Non Stress  04/10/2014   OBSTETRICAL ULTRASOUND: This exam was performed within a [place] [facility] Department. The OB US report was generated in the AS system, and faxed to the ordering physician.   This report is available in the [facility]. See the AS Obstetric US report via the Image Link.  US Fetal Bpp W/o Non Stress  04/03/2014   OBSTETRICAL ULTRASOUND: This exam was performed within a [place] [facility]ound Department. The OB US report was generated in the AS system, and faxed to the ordering physician.   This report is available in the [facility]. See the AS Obstetric US report via the Image Link.  US Fetal Bpp W/o Non Stress  03/29/2014   OBSTETRICAL ULTRASOUND: This exam was performed within a [place] [facility] Department. The OB US report was generated in the AS system, and faxed to the ordering physician.   This  report is available in the [facility]. See the AS Obstetric US report via the Image Link.  US Ob Follow Up Addl Gest  03/29/2014   OBSTETRICAL ULTRASOUND: This exam was performed within a [place] [facility] Department. The OB US report was generated in the AS system, and faxed to the ordering physician.   This report is available in the [facility]. See the AS Obstetric US report  via the Image Link.  [place] Ua Addl Gest  03/29/2014   OBSTETRICAL ULTRASOUND: This exam was performed within a [place] [facility] Department. The OB US report was generated in the AS system, and faxed to the ordering physician.   This report is available in the [facility]. See the AS Obstetric US report via the Image Link.  [place] Ua Doppler Addl Gest Re Eval  04/10/2014   OBSTETRICAL ULTRASOUND: This exam was performed within a [place] [facility] Department. The OB US report was generated in the AS system, and faxed to the ordering physician.   This report is available in the [facility]. See the AS Obstetric US report via the Image Link.  [place] Ua Doppler Addl Gest Re Eval  04/03/2014   OBSTETRICAL ULTRASOUND: This exam was performed within a [place] [facility]ound Department. The OB US report was generated in the AS system, and faxed to the ordering physician.   This report is available in the [facility]. See the AS Obstetric US report via the Image Link.  [place] Ua Cord Doppler  04/03/2014   OBSTETRICAL ULTRASOUND: This exam was performed within a [place] [facility] Department. The OB US report was generated in the AS system, and faxed to the ordering physician.   This report is available in the [facility]. See the AS Obstetric US report via the Image Link.  [place] Ua Cord Doppler  03/29/2014   OBSTETRICAL ULTRASOUND: This exam was performed within a [place] [facility] Department. The OB US report was generated in the AS system, and faxed to the ordering physician.   This report is available in the [facility]. See  the AS Obstetric US report via the Image Link.  [place] Ua Doppler Re-eval  04/10/2014   OBSTETRICAL ULTRASOUND: This exam was performed within a [place] [facility] Department. The OB US report was generated in the AS system, and faxed to the ordering physician.   This report is available in the [facility]. See the AS Obstetric US report via the Image Link.  [place] Mfm Ob Transvaginal  04/16/2014   OBSTETRICAL ULTRASOUND: This exam was performed within a [place] [facility] Department. The OB US report was generated in the AS system, and faxed to the ordering physician.   This report is available in the [facility]. See the AS Obstetric US report via the Image Link.  US Fetal Bpp Wo Nst Addl Gestation  04/10/2014   OBSTETRICAL ULTRASOUND: This exam was performed within a [place] [facility] Department. The OB US report was generated in the AS system, and faxed to the ordering physician.   This report is available in the [facility]. See the AS Obstetric US report via the Image Link.  US Fetal Bpp Wo Nst Addl Gestation  04/03/2014   OBSTETRICAL ULTRASOUND: This exam was performed within a [place] [facility] Department. The OB US report was generated in the AS system, and faxed to the ordering physician.   This report is available in the [facility]. See the AS Obstetric US report via the Image Link.  US Fetal Bpp Wo Nst Addl Gestation  03/29/2014   OBSTETRICAL ULTRASOUND: This exam was performed within a [place] [facility] Department. The OB US report was generated in the AS system, and faxed to the ordering physician.   This report is available in the [facility]. See the AS Obstetric US report via the Image Link.   Marland Kitchen acetaminophen  1,000 mg Oral Once  .  docusate sodium  100 mg Oral Daily  . lactated ringers  1,000 mL Intravenous Once  . ondansetron (ZOFRAN) IV  4 mg Intravenous Once  . prenatal multivitamin  1 tablet Oral Q1200   I have reviewed the patient's current  medications.   ASSESSMENT: Patient Active Problem List   Diagnosis Date Noted  . Epigastric abdominal pain 04/17/2014  . 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 Positive fetal fibronectin at 22 weeks to [redacted] weeks gestation   . Preterm labor in third trimester without delivery   . [redacted] weeks gestation of pregnancy   . Multiple pregnancy   . Preterm labor 04/15/2014  . [redacted] weeks gestation of pregnancy   . Poor fetal growth affecting management of mother in second trimester   . [redacted] weeks gestation of pregnancy   . Decreased amniotic fluid 03/15/2014  . Abnormal fetal ultrasound 03/15/2014  . [redacted] weeks gestation of pregnancy   . [redacted] weeks gestation of pregnancy   . Evaluate anatomy not seen on prior sonogram   . [redacted] weeks gestation of pregnancy   . Abdominal pain affecting pregnancy   . Cervical shortening affecting pregnancy in second trimester, antepartum   . Monochorionic diamniotic twin pregnancy   . Monoamniotic and monochorionic twin gestation in second trimester   . [redacted] weeks gestation of pregnancy   . Twins   . 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 [redacted] weeks gestation of pregnancy   . Vaginal bleeding in pregnancy   . [redacted] weeks gestation of pregnancy   . 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 Monochorionic diamniotic twin gestation 12/25/2013  . Uterine fibroid 12/25/2013  . Obesity 12/25/2013  . Rh negative status during pregnancy 12/25/2013  . Abdominal pain 12/19/2010   Sherri [name] is a 29 y.o. G1P0 at [redacted]w[redacted]d with di/di twins discharged yesterday for threatened preterm labor who returns for the second time in 24 hours for evaluation of epigastric abdominal pain, nausea, and headache.   #Abdominal Pain Etiology remains unclear. Reassured by normal vital signs and benign abdominal examination. Symptoms most likely physiologic given twins in the third trimester. Differential diagnosis  includes gallstones (although LFTs wnl) vs ketoacidosis (labs showed mild decrease in bicarbinate and urinalysis consistent with mild dehydration, although no know risk factors). Labor unlikely as no contractions and no cervical change.  - 1L LR bolus now - Start 1/2 NS at 125 cc/hr after completion of fluid bolus - Repeat CBC, CMP in AM - RUQ ultrasound to evaluate for liver/gallbladder pathology - Zofran prn nausea - Acetaminophen prn pain  #FWB - NST reactive x 2 - Status post BMZ #1 1/21, #2 1/25, Again 2/14, 2/15 - Status post neuroprotective magnesium x 12 hours - NST qshift   Shelbie Hutching MD 04/17/14 3:14 AM

## 2014-04-17 NOTE — Progress Notes (Signed)
Discharge instructions reviewed with patient. Patient verbalizes understandings and does not have any questions. Belongings at the bedside gathered and with patient. Patient waiting for ride home.

## 2014-04-17 NOTE — Progress Notes (Signed)
Clinical Social Work Department ANTENATAL PSYCHOSOCIAL ASSESSMENT 04/17/2014  Patient:  Sherri [name], Sherri [name]   Account Number:  [id number]  Admit Date:  04/17/2014     DOB:  1985/12/30   Age:  29 Gestational age on admission:  29     Expected delivery date:  07/05/2014 Admitting diagnosis:   Epigastric abdominal pain    Clinical Social Worker:  Terri Piedra,  [place]  Date/Time:  04/17/2014 02:30 PM  FAMILY/HOME ENVIRONMENT  Home address:   [address]  [place], [place] [address]   Household Member/Support Name Relationship Age  Ruffin Frederick SIGNIFICANT OTHER 52   Other support:   "Mom, Uncle, and some friends."       PSYCHOSOCIAL DATA  Information source:  Patient Interview Other information source:    Resources:   Employment:   Patient is currently on bed rest, but was working at [employer] prior to that.  FOB works at [employer] (7am-5pm)   Medicaid (county):  [place]:     Current grade:    Homebound arranged?      Cultural/Environmental issues impacting care:   None stated    STRENGTHS / WEAKNESSES / FACTORS TO CONSIDER  Concerns related to hospitalization:   Patient does not state any concerns.  CSW attempted to evaluate if patient feels safer (in regards to the pregnancy) if she is in the hospital, since she has had so many visits to MAU, but patient denies feeling scared about the pregnancy.   Previous pregnancies/feelings towards pregnancy?  Concerns related to being/becoming a mother?   This is patient's first pregnancy.  It is unplanned.  She states she was in shock at first, then again when she found out she was having twins, but is excited about the baby's now.   Social support (FOB? Who is/will be helping with baby/other kids)   Patient reports that she and FOB are in a committed, supportive relationship.   Couples relationship:   see above   Recent stressful life events (life changes in past year?):   None stated   Prenatal  care/education/home preparations?   Patient states they have begun getting baby items.   Domestic violence (of any type):  N If yes to domestic violence describe/action plan:  Substance use during pregnancy.  (If YES, complete SBIRT):  N  Complete PHQ-9 (Depression Screening) on all antenatal patients.  PHQ-9 score:    (IF SCORE => 15 complete TREAT)  Follow up recommendations:   None   Patient advised/response?   Other:    Clinical Assessment/Plan CSW met with patient to offer support.  Patient states she is in a lot of pain with the pregnancy.  She states she has a good support system and is excited about the babies.  She is "ready to get my life back" when talking about being ready to have the pregnancy be over.  She understands, however, that she does not want the babies to come prematurely.  We discussed that pregnancy is uncomfortable and often painful.  CSW also noted that pregnancy is temporary.  CSW helped patient identify positive aspects of pregnancy.  She states she likes to feel the babies kick. Patient states no emotional stressors at this time, nor any questions or needs of CSW.  CSW encouraged patient to embrace the pregnancy, and trust her medical providers, as well as her instinct.  Patient seemed appreciative of the visit and support offered.

## 2014-04-18 ENCOUNTER — Telehealth: Payer: Self-pay | Admitting: *Deleted

## 2014-04-18 LAB — H. PYLORI ANTIBODY, IGG: H Pylori IgG: <0.9 U/mL (ref 0.0–0.8)

## 2014-04-18 MED ORDER — ONDANSETRON 8 MG PO TBDP: 8.0000 mg | ORAL_TABLET | Freq: Two times a day (BID) | ORAL | Status: DC

## 2014-04-18 NOTE — Telephone Encounter (Signed)
Pt left message requesting refill of nausea medicine. She states that the doctor was supposed to send it in but her pharmacy does not have it. Refill order received from Dr. Harolyn Rutherford. Pt was called and a message left on her voice mail that her refill has been sent to her pharmacy. She may call back if she has additional questions.

## 2014-04-19 ENCOUNTER — Telehealth: Payer: Self-pay

## 2014-04-19 ENCOUNTER — Other Ambulatory Visit: Payer: Self-pay | Admitting: Family Medicine

## 2014-04-19 ENCOUNTER — Other Ambulatory Visit (HOSPITAL_COMMUNITY): Payer: Self-pay | Admitting: Maternal and Fetal Medicine

## 2014-04-19 ENCOUNTER — Ambulatory Visit (HOSPITAL_COMMUNITY)
Admission: RE | Admit: 2014-04-19 | Discharge: 2014-04-19 | Disposition: A | Discharge status: Home / Self Care | Payer: Medicaid Other | Source: Ambulatory Visit | Attending: Physician Assistant | Admitting: Physician Assistant

## 2014-04-19 ENCOUNTER — Encounter (HOSPITAL_COMMUNITY): Payer: Self-pay

## 2014-04-19 DIAGNOSIS — IMO0001 Reserved for inherently not codable concepts without codable children: Secondary | ICD-10-CM

## 2014-04-19 DIAGNOSIS — R1013 Epigastric pain: Secondary | ICD-10-CM

## 2014-04-19 DIAGNOSIS — O30003 Twin pregnancy, unspecified number of placenta and unspecified number of amniotic sacs, third trimester: Secondary | ICD-10-CM | POA: Insufficient documentation

## 2014-04-19 DIAGNOSIS — O365931 Maternal care for other known or suspected poor fetal growth, third trimester, fetus 1: Secondary | ICD-10-CM | POA: Insufficient documentation

## 2014-04-19 DIAGNOSIS — O30033 Twin pregnancy, monochorionic/diamniotic, third trimester: Secondary | ICD-10-CM | POA: Insufficient documentation

## 2014-04-19 DIAGNOSIS — Z3A29 29 weeks gestation of pregnancy: Secondary | ICD-10-CM | POA: Diagnosis not present

## 2014-04-19 DIAGNOSIS — O3413 Maternal care for benign tumor of corpus uteri, third trimester: Secondary | ICD-10-CM | POA: Insufficient documentation

## 2014-04-19 DIAGNOSIS — D259 Leiomyoma of uterus, unspecified: Secondary | ICD-10-CM | POA: Diagnosis not present

## 2014-04-19 LAB — TYPE AND SCREEN
ABO/RH(D): A NEG
Antibody Screen: POSITIVE
DAT, IgG: NEGATIVE
Unit division: 0
Unit division: 0

## 2014-04-19 NOTE — Telephone Encounter (Signed)
-----   Message from Truett Mainland, DO sent at 04/19/2014  2:05 PM EST ----- Still having epigastric pain despite PPI and H2 blocker.  H pylori negative.  Patient referred to Gastroenterology.  Pt aware of results.

## 2014-04-19 NOTE — Telephone Encounter (Signed)
Patient called concerning her appointment for GI-- asked that it not be a Tuesday. Called [facility] at [phone]-- office closed. Will attempt again tomorrow.

## 2014-04-19 NOTE — Telephone Encounter (Signed)
Received a call from MFM who states patient has BPP scheduled on 04/24/14 and then NST here in clinic two hours later. States Dr. Lisbeth Renshaw states patient will not need NST and it can be canceled for this day. Informed her appointment will be canceled.---- after further review it appears patient is scheduled to see provider 04/24/14 and is not on the NST schedule. Called MFM and spoke to [place] again who states she did inform patient that it would be canceled but that she will call patient right now and will also edit appointment notes for BPP and let her know that she still has appointment for clinic on 04/24/14. No further follow up needed.

## 2014-04-19 NOTE — ED Notes (Signed)
Pt reports no fetal movement since D/C home on 2/17.

## 2014-04-20 ENCOUNTER — Encounter (HOSPITAL_COMMUNITY): Payer: Self-pay | Admitting: *Deleted

## 2014-04-20 ENCOUNTER — Observation Stay (HOSPITAL_COMMUNITY)
Admission: AD | Admit: 2014-04-20 | Discharge: 2014-04-21 | Disposition: A | Discharge status: Home / Self Care | Payer: Medicaid Other | Source: Ambulatory Visit | Attending: Obstetrics & Gynecology | Admitting: Obstetrics & Gynecology

## 2014-04-20 ENCOUNTER — Encounter: Payer: Self-pay | Admitting: Physician Assistant

## 2014-04-20 ENCOUNTER — Inpatient Hospital Stay (HOSPITAL_COMMUNITY): Payer: Medicaid Other

## 2014-04-20 DIAGNOSIS — Z3A29 29 weeks gestation of pregnancy: Secondary | ICD-10-CM | POA: Diagnosis not present

## 2014-04-20 DIAGNOSIS — K5900 Constipation, unspecified: Secondary | ICD-10-CM | POA: Diagnosis not present

## 2014-04-20 DIAGNOSIS — O212 Late vomiting of pregnancy: Secondary | ICD-10-CM | POA: Diagnosis not present

## 2014-04-20 DIAGNOSIS — O30033 Twin pregnancy, monochorionic/diamniotic, third trimester: Secondary | ICD-10-CM | POA: Diagnosis not present

## 2014-04-20 DIAGNOSIS — O283 Abnormal ultrasonic finding on antenatal screening of mother: Secondary | ICD-10-CM

## 2014-04-20 DIAGNOSIS — R1013 Epigastric pain: Secondary | ICD-10-CM | POA: Diagnosis present

## 2014-04-20 DIAGNOSIS — O26899 Other specified pregnancy related conditions, unspecified trimester: Secondary | ICD-10-CM

## 2014-04-20 DIAGNOSIS — O219 Vomiting of pregnancy, unspecified: Secondary | ICD-10-CM

## 2014-04-20 LAB — URINALYSIS, ROUTINE W REFLEX MICROSCOPIC
Bilirubin Urine: NEGATIVE
Glucose, UA: NEGATIVE mg/dL
Hgb urine dipstick: NEGATIVE
Ketones, ur: NEGATIVE mg/dL
Leukocytes, UA: NEGATIVE
Nitrite: NEGATIVE
Protein, ur: NEGATIVE mg/dL
Specific Gravity, Urine: 1.01 (ref 1.005–1.030)
Urobilinogen, UA: 0.2 mg/dL (ref 0.0–1.0)
pH: 5.5 (ref 5.0–8.0)

## 2014-04-20 LAB — COMPREHENSIVE METABOLIC PANEL
ALT: 19 U/L (ref 0–35)
AST: 22 U/L (ref 0–37)
Albumin: 2.8 g/dL — ABNORMAL LOW (ref 3.5–5.2)
Alkaline Phosphatase: 170 U/L — ABNORMAL HIGH (ref 39–117)
Anion gap: 4 — ABNORMAL LOW (ref 5–15)
BUN: 6 mg/dL (ref 6–23)
CO2: 21 mmol/L (ref 19–32)
Calcium: 8.7 mg/dL (ref 8.4–10.5)
Chloride: 110 mmol/L (ref 96–112)
Creatinine, Ser: 0.46 mg/dL — ABNORMAL LOW (ref 0.50–1.10)
GFR calc Af Amer: >90 mL/min (ref >90)
GFR calc non Af Amer: >90 mL/min (ref >90)
Glucose, Bld: 91 mg/dL (ref 70–99)
Potassium: 3.8 mmol/L (ref 3.5–5.1)
Sodium: 135 mmol/L (ref 135–145)
Total Bilirubin: 0.4 mg/dL (ref 0.3–1.2)
Total Protein: 6 g/dL (ref 6.0–8.3)

## 2014-04-20 LAB — CBC
HCT: 33.7 % — ABNORMAL LOW (ref 36.0–46.0)
Hemoglobin: 11.1 g/dL — ABNORMAL LOW (ref 12.0–15.0)
MCH: 28.8 pg (ref 26.0–34.0)
MCHC: 32.9 g/dL (ref 30.0–36.0)
MCV: 87.3 fL (ref 78.0–100.0)
Platelets: 158 10*3/uL (ref 150–400)
RBC: 3.86 MIL/uL — ABNORMAL LOW (ref 3.87–5.11)
RDW: 15.7 % — ABNORMAL HIGH (ref 11.5–15.5)
WBC: 11.7 10*3/uL — ABNORMAL HIGH (ref 4.0–10.5)

## 2014-04-20 MED ORDER — LACTATED RINGERS IV SOLN
INTRAVENOUS | Status: DC
  Administered 2014-04-20 – 2014-04-21 (×3): via INTRAVENOUS

## 2014-04-20 MED ORDER — PRENATAL MULTIVITAMIN CH
1.0000 | ORAL_TABLET | Freq: Every day | ORAL | Status: DC
  Administered 2014-04-21: 1 via ORAL
  Filled 2014-04-20: qty 1

## 2014-04-20 MED ORDER — ZOLPIDEM TARTRATE 5 MG PO TABS
5.0000 mg | ORAL_TABLET | Freq: Every evening | ORAL | Status: DC | PRN
  Administered 2014-04-20: 5 mg via ORAL
  Filled 2014-04-20: qty 1

## 2014-04-20 MED ORDER — DOCUSATE SODIUM 100 MG PO CAPS
100.0000 mg | ORAL_CAPSULE | Freq: Every day | ORAL | Status: DC
  Administered 2014-04-21: 100 mg via ORAL
  Filled 2014-04-20: qty 1

## 2014-04-20 MED ORDER — CALCIUM CARBONATE ANTACID 500 MG PO CHEW: 2.0000 | CHEWABLE_TABLET | ORAL | Status: DC | PRN

## 2014-04-20 MED ORDER — GI COCKTAIL ~~LOC~~
30.0000 mL | Freq: Three times a day (TID) | ORAL | Status: DC | PRN
  Administered 2014-04-20: 30 mL via ORAL
  Filled 2014-04-20: qty 30

## 2014-04-20 MED ORDER — ACETAMINOPHEN 325 MG PO TABS: 650.0000 mg | ORAL_TABLET | ORAL | Status: DC | PRN

## 2014-04-20 MED ORDER — ONDANSETRON 8 MG PO TBDP
8.0000 mg | ORAL_TABLET | Freq: Once | ORAL | Status: AC
  Administered 2014-04-20: 8 mg via ORAL
  Filled 2014-04-20: qty 1

## 2014-04-20 MED ORDER — MILK AND MOLASSES ENEMA
1.0000 | Freq: Once | RECTAL | Status: DC
  Filled 2014-04-20: qty 250

## 2014-04-20 MED ORDER — BISACODYL 10 MG RE SUPP
10.0000 mg | RECTAL | Status: DC | PRN
  Administered 2014-04-20 – 2014-04-21 (×2): 10 mg via RECTAL
  Filled 2014-04-20 (×4): qty 1

## 2014-04-20 MED ORDER — MILK AND MOLASSES ENEMA: 1.0000 | Freq: Once | RECTAL | Status: DC

## 2014-04-20 NOTE — MAU Note (Signed)
Pain in upper abd, been going on all week, constant.  Feet have been hurting too.

## 2014-04-20 NOTE — MAU Note (Signed)
t seems to be in  More abd distress. Stated her upper abd still hurts and is not feeling any better since Zofran given. Notified K.Shaw,CNM. [facility] [place] suggested and ordered.

## 2014-04-20 NOTE — Telephone Encounter (Signed)
Patient has appointment scheduled with [facility] for 04/30/14 at 1:45PM. Called patient--she is aware as she made the appointment.

## 2014-04-20 NOTE — MAU Provider Note (Signed)
History  Chief Complaint:  Abdominal Pain  Sherri [name] is a 29 y.o. G1P0 female at [redacted]w[redacted]d presenting with bilateral epigastric pain, headache, n/v, and sore feet.  Has vomitted multiple times today, last about 1500.  She reports diarrhea 2 days ago, but normal bowel movements since then.  Denies visual changes, fevers/chills/sweats/malaise.   Reports active x2 fetal movement, contractions: "constant", vaginal bleeding: none, membranes: intact. Denies uti s/s, abnormal/malodorous vag d/c or vulvovaginal itching/irritation.   Prenatal care at [facility].  Next visit 04/24/2014.  Pregnancy complicated by monochorionic/diamniotic twin gestation, uterine fibroid, obesity, Rh negative, cervical shortening, decreased amniotic fluid, poor fetal growth, and positive Ffn.  Obstetrical History: OB History    Gravida Para Term Preterm AB TAB SAB Ectopic Multiple Living   1         0      Past Medical History: Past Medical History  Diagnosis Date  . Uterine fibroid     Past Surgical History: Past Surgical History  Procedure Laterality Date  . No past surgeries      Social History: History   Social History  . Marital Status: Single    Spouse Name: N/A  . Number of Children: N/A  . Years of Education: N/A   Social History Main Topics  . Smoking status: Never Smoker   . Smokeless tobacco: Never Used  . Alcohol Use: No     Comment: occasional  . Drug Use: No  . Sexual Activity: Yes    Birth Control/ Protection: None   Other Topics Concern  . None   Social History Narrative    Allergies: No Known Allergies  Prescriptions prior to admission  Medication Sig Dispense Refill Last Dose  . acetaminophen (TYLENOL) 325 MG tablet Take 2 tablets (650 mg total) by mouth every 6 (six) hours as needed for mild pain or headache. 30 tablet 1 04/19/2014 at Unknown time  . bisacodyl (DULCOLAX) 10 MG suppository Place 10 mg rectally as needed for moderate constipation.   Past Week at Unknown time   . cyclobenzaprine (FLEXERIL) 10 MG tablet Take 1 tablet (10 mg total) by mouth 3 (three) times daily as needed for muscle spasms. 30 tablet 0 04/19/2014 at Unknown time  . ondansetron (ZOFRAN-ODT) 8 MG disintegrating tablet Take 1 tablet (8 mg total) by mouth every 12 (twelve) hours. 20 tablet 3 04/19/2014 at Unknown time  . polyethylene glycol (MIRALAX / GLYCOLAX) packet Take 17 g by mouth daily. 14 each 1 Past Week at Unknown time  . Prenatal Vit-Fe Fumarate-FA (PRENATAL MULTIVITAMIN) TABS tablet Take 1 tablet by mouth daily.    04/19/2014 at Unknown time  . ranitidine (ZANTAC) 300 MG tablet Take 1 tablet (300 mg total) by mouth at bedtime. 30 tablet 3 Past Week at Unknown time  . scopolamine (TRANSDERM-SCOP) 1 MG/3DAYS Place 1 patch (1.5 mg total) onto the skin every 3 (three) days. 10 patch 12 04/20/2014 at Unknown time  . pantoprazole (PROTONIX) 40 MG tablet Take 1 tablet (40 mg total) by mouth 2 (two) times daily. (Patient not taking: Reported on 04/16/2014) 50 tablet 3   . sodium chloride (BRONCHO SALINE) inhaler solution Take 1 spray by nebulization as needed. (Patient not taking: Reported on 04/20/2014) 90 mL 12 Past Week at Unknown time    Review of Systems  Pertinent pos/neg as indicated in HPI  Physical Exam  Blood pressure 126/74, pulse 117, temperature 98.7 F (37.1 C), temperature source Oral, resp. rate 20, last menstrual period 10/03/2013, SpO2 93 %.  General appearance: alert, cooperative and no distress Lungs: normal effort Heart: regular rate and rhythm, mild tachycardia Abdomen: gravid, soft, epigastric tenderness bilaterally Extremities: no edema DTR's 2+ in all extremities, no clonus  Cervix: closed/90%/0sta/unsure presentation    Fetal monitoring: FHR: A -145 bpm, B - 140 bpm, variability: moderate,  Accelerations: Present,  decelerations:  Absent Uterine activity: irritability, with occasional contraction  MAU Course    Labs:  Results for orders placed or  performed during the hospital encounter of 04/20/14 (from the past 24 hour(s))  Urinalysis, Routine w reflex microscopic     Status: None   Collection Time: 04/20/14  5:25 PM  Result Value Ref Range   Color, Urine YELLOW YELLOW   APPearance CLEAR CLEAR   Specific Gravity, Urine 1.010 1.005 - 1.030   pH 5.5 5.0 - 8.0   Glucose, UA NEGATIVE NEGATIVE mg/dL   Hgb urine dipstick NEGATIVE NEGATIVE   Bilirubin Urine NEGATIVE NEGATIVE   Ketones, ur NEGATIVE NEGATIVE mg/dL   Protein, ur NEGATIVE NEGATIVE mg/dL   Urobilinogen, UA 0.2 0.0 - 1.0 mg/dL   Nitrite NEGATIVE NEGATIVE   Leukocytes, UA NEGATIVE NEGATIVE    Imaging:   Assessment and Plan  A:  [redacted]w[redacted]d SIUP, mono/di twin gestation  G1P0  N/V, epigastric pain  Cat 1 FHR P:  CBC, CMP  Reviewed    Ross,Lisa Wynne SNM 2/19/20166:18 PM    Pt s/p a dose of Zofran and GI cocktail with minimal improvement in s/s. Continues to intake short breaths w/ breathing.  Cx moderately developed LUS/post FT/60%   Rev'd with Dr Elonda Husky:  Will get CXR and then admit to Antenatal for overnight hydration.  Serita Grammes CNM 9:23 PM 04/20/2014

## 2014-04-21 DIAGNOSIS — O9989 Other specified diseases and conditions complicating pregnancy, childbirth and the puerperium: Secondary | ICD-10-CM

## 2014-04-21 DIAGNOSIS — R1013 Epigastric pain: Secondary | ICD-10-CM

## 2014-04-21 DIAGNOSIS — O212 Late vomiting of pregnancy: Secondary | ICD-10-CM | POA: Diagnosis not present

## 2014-04-21 DIAGNOSIS — K5900 Constipation, unspecified: Secondary | ICD-10-CM

## 2014-04-21 MED ORDER — FAMOTIDINE 20 MG PO TABS
20.0000 mg | ORAL_TABLET | Freq: Two times a day (BID) | ORAL | Status: DC
  Administered 2014-04-21: 20 mg via ORAL
  Filled 2014-04-21: qty 1

## 2014-04-21 MED ORDER — DOCUSATE SODIUM 100 MG PO CAPS: 100.0000 mg | ORAL_CAPSULE | Freq: Two times a day (BID) | ORAL | Status: DC

## 2014-04-21 MED ORDER — MILK AND MOLASSES ENEMA
1.0000 | Freq: Once | RECTAL | Status: DC | PRN
  Filled 2014-04-21: qty 250

## 2014-04-21 MED ORDER — ONDANSETRON 8 MG PO TBDP
8.0000 mg | ORAL_TABLET | Freq: Two times a day (BID) | ORAL | Status: DC
  Administered 2014-04-21: 8 mg via ORAL
  Filled 2014-04-21 (×4): qty 1

## 2014-04-21 NOTE — Progress Notes (Signed)
Utilization Review completed.  

## 2014-04-21 NOTE — Discharge Summary (Signed)
Physician Discharge Summary  Patient ID: Sherri [name] MRN: [id number] DOB/AGE: 27-Sep-1985 29 y.o.  Admit date: 04/20/2014 Discharge date: 04/21/2014  Admission Diagnoses: Abdominal pain  Discharge Diagnoses: severe constipation Active Problems:   Pregnancy with epigastric pain, antepartum   Discharged Condition: good  Hospital Course: 29 yo G1P0 at 51 weeks with mono-di twins admitted with severe constipation. Patient received a ducolax suppository as well as a milk and molasses enema twice with some good results. Patient was instructed to increase water intake, to take her previously prescribed miralax and to start taking colace twice daily. Patient should follow up as planned on 2/23 for routine prenatal care  Consults: None  Treatments: enema  Discharge Exam: Blood pressure 133/61, pulse 109, temperature 98 F (36.7 C), temperature source Oral, resp. rate 18, height 5' (1.524 m), weight 177 lb (80.287 kg), last menstrual period 10/03/2013, SpO2 98 %. General appearance: alert, cooperative and no distress Resp: clear to auscultation bilaterally Cardio: regular rate and rhythm GI: soft, gravid, nontender Extremities: extremities normal, atraumatic, no cyanosis or edema  Disposition: 01-Home or Self Care     Medication List    STOP taking these medications        pantoprazole 40 MG tablet  Commonly known as:  PROTONIX     sodium chloride inhaler solution  Commonly known as:  BRONCHO SALINE      TAKE these medications        acetaminophen 325 MG tablet  Commonly known as:  TYLENOL  Take 2 tablets (650 mg total) by mouth every 6 (six) hours as needed for mild pain or headache.     bisacodyl 10 MG suppository  Commonly known as:  DULCOLAX  Place 10 mg rectally as needed for moderate constipation.     cyclobenzaprine 10 MG tablet  Commonly known as:  FLEXERIL  Take 1 tablet (10 mg total) by mouth 3 (three) times daily as needed for muscle spasms.     docusate  sodium 100 MG capsule  Commonly known as:  COLACE  Take 1 capsule (100 mg total) by mouth 2 (two) times daily.     ondansetron 8 MG disintegrating tablet  Commonly known as:  ZOFRAN-ODT  Take 1 tablet (8 mg total) by mouth every 12 (twelve) hours.     polyethylene glycol packet  Commonly known as:  MIRALAX / GLYCOLAX  Take 17 g by mouth daily.     prenatal multivitamin Tabs tablet  Take 1 tablet by mouth daily.     ranitidine 300 MG tablet  Commonly known as:  ZANTAC  Take 1 tablet (300 mg total) by mouth at bedtime.     scopolamine 1 MG/3DAYS  Commonly known as:  TRANSDERM-SCOP  Place 1 patch (1.5 mg total) onto the skin every 3 (three) days.       Follow-up Information    Follow up with [facility].   Why:  Keep next scheduled appointment.   Contact information:   Stella Raymond [phone]      Signed: [place] 04/21/2014, 3:00 PM

## 2014-04-21 NOTE — Discharge Instructions (Signed)
Abdominal Pain During Pregnancy Belly (abdominal) pain is common during pregnancy. Most of the time, it is not a serious problem. Other times, it can be a sign that something is wrong with the pregnancy. Always tell your doctor if you have belly pain. HOME CARE Monitor your belly pain for any changes. The following actions may help you feel better:  Do not have sex (intercourse) or put anything in your vagina until you feel better.  Rest until your pain stops.  Drink clear fluids if you feel sick to your stomach (nauseous). Do not eat solid food until you feel better.  Only take medicine as told by your doctor.  Keep all doctor visits as told. GET HELP RIGHT AWAY IF:   You are bleeding, leaking fluid, or pieces of tissue come out of your vagina.  You have more pain or cramping.  You keep throwing up (vomiting).  You have pain when you pee (urinate) or have blood in your pee.  You have a fever.  You do not feel your baby moving as much.  You feel very weak or feel like passing out.  You have trouble breathing, with or without belly pain.  You have a very bad headache and belly pain.  You have fluid leaking from your vagina and belly pain.  You keep having watery poop (diarrhea).  Your belly pain does not go away after resting, or the pain gets worse. MAKE SURE YOU:   Understand these instructions.  Will watch your condition.  Will get help right away if you are not doing well or get worse. Document Released: 02/04/2009 Document Revised: 10/19/2012 Document Reviewed: 09/15/2012 [facility] Patient Information 2015 [place], [place]. This information is not intended to replace advice given to you by your health care provider. Make sure you discuss any questions you have with your health care provider.    Constipation Constipation is when a person has fewer than three bowel movements a week, has difficulty having a bowel movement, or has stools that are dry, hard, or larger  than normal. As people grow older, constipation is more common. If you try to fix constipation with medicines that make you have a bowel movement (laxatives), the problem may get worse. Long-term laxative use may cause the muscles of the colon to become weak. A low-fiber diet, not taking in enough fluids, and taking certain medicines may make constipation worse.  CAUSES   Certain medicines, such as antidepressants, pain medicine, iron supplements, antacids, and water pills.   Certain diseases, such as diabetes, irritable bowel syndrome (IBS), thyroid disease, or depression.   Not drinking enough water.   Not eating enough fiber-rich foods.   Stress or travel.   Lack of physical activity or exercise.   Ignoring the urge to have a bowel movement.   Using laxatives too much.  SIGNS AND SYMPTOMS   Having fewer than three bowel movements a week.   Straining to have a bowel movement.   Having stools that are hard, dry, or larger than normal.   Feeling full or bloated.   Pain in the lower abdomen.   Not feeling relief after having a bowel movement.  DIAGNOSIS  Your health care provider will take a medical history and perform a physical exam. Further testing may be done for severe constipation. Some tests may include:  A barium enema X-ray to examine your rectum, colon, and, sometimes, your small intestine.   A sigmoidoscopy to examine your lower colon.   A colonoscopy to  examine your entire colon. TREATMENT  Treatment will depend on the severity of your constipation and what is causing it. Some dietary treatments include drinking more fluids and eating more fiber-rich foods. Lifestyle treatments may include regular exercise. If these diet and lifestyle recommendations do not help, your health care provider may recommend taking over-the-counter laxative medicines to help you have bowel movements. Prescription medicines may be prescribed if over-the-counter medicines do  not work.  HOME CARE INSTRUCTIONS   Eat foods that have a lot of fiber, such as fruits, vegetables, whole grains, and beans.  Limit foods high in fat and processed sugars, such as french fries, hamburgers, cookies, candies, and soda.   A fiber supplement may be added to your diet if you cannot get enough fiber from foods.   Drink enough fluids to keep your urine clear or pale yellow.   Exercise regularly or as directed by your health care provider.   Go to the restroom when you have the urge to go. Do not hold it.   Only take over-the-counter or prescription medicines as directed by your health care provider. Do not take other medicines for constipation without talking to your health care provider first.  [place] IF:   You have bright red blood in your stool.   Your constipation lasts for more than 4 days or gets worse.   You have abdominal or rectal pain.   You have thin, pencil-like stools.   You have unexplained weight loss. MAKE SURE YOU:   Understand these instructions.  Will watch your condition.  Will get help right away if you are not doing well or get worse. Document Released: 11/15/2003 Document Revised: 02/21/2013 Document Reviewed: 11/28/2012 [facility] Patient Information 2015 [place], [place]. This information is not intended to replace advice given to you by your health care provider. Make sure you discuss any questions you have with your health care provider.

## 2014-04-24 ENCOUNTER — Ambulatory Visit (HOSPITAL_COMMUNITY)
Admission: RE | Admit: 2014-04-24 | Discharge: 2014-04-24 | Disposition: A | Discharge status: Home / Self Care | Payer: Medicaid Other | Source: Ambulatory Visit | Attending: Obstetrics & Gynecology | Admitting: Obstetrics & Gynecology

## 2014-04-24 ENCOUNTER — Other Ambulatory Visit (HOSPITAL_COMMUNITY): Payer: Self-pay | Admitting: Maternal and Fetal Medicine

## 2014-04-24 ENCOUNTER — Encounter (HOSPITAL_COMMUNITY): Payer: Self-pay

## 2014-04-24 ENCOUNTER — Ambulatory Visit (HOSPITAL_COMMUNITY)
Admission: RE | Admit: 2014-04-24 | Discharge: 2014-04-24 | Disposition: A | Discharge status: Home / Self Care | Payer: Medicaid Other | Source: Ambulatory Visit | Attending: Family Medicine | Admitting: Family Medicine

## 2014-04-24 ENCOUNTER — Ambulatory Visit (INDEPENDENT_AMBULATORY_CARE_PROVIDER_SITE_OTHER): Payer: Medicaid Other | Admitting: Obstetrics & Gynecology

## 2014-04-24 VITALS — BP 138/120 | HR 118 | Wt 177.0 lb

## 2014-04-24 VITALS — BP 142/70 | HR 126 | Temp 98.1°F | Wt 176.1 lb

## 2014-04-24 DIAGNOSIS — O365931 Maternal care for other known or suspected poor fetal growth, third trimester, fetus 1: Secondary | ICD-10-CM | POA: Insufficient documentation

## 2014-04-24 DIAGNOSIS — O365930 Maternal care for other known or suspected poor fetal growth, third trimester, not applicable or unspecified: Secondary | ICD-10-CM | POA: Insufficient documentation

## 2014-04-24 DIAGNOSIS — O3413 Maternal care for benign tumor of corpus uteri, third trimester: Secondary | ICD-10-CM | POA: Insufficient documentation

## 2014-04-24 DIAGNOSIS — O30033 Twin pregnancy, monochorionic/diamniotic, third trimester: Secondary | ICD-10-CM | POA: Insufficient documentation

## 2014-04-24 DIAGNOSIS — O283 Abnormal ultrasonic finding on antenatal screening of mother: Secondary | ICD-10-CM

## 2014-04-24 DIAGNOSIS — K640 First degree hemorrhoids: Secondary | ICD-10-CM

## 2014-04-24 DIAGNOSIS — IMO0001 Reserved for inherently not codable concepts without codable children: Secondary | ICD-10-CM

## 2014-04-24 DIAGNOSIS — D259 Leiomyoma of uterus, unspecified: Secondary | ICD-10-CM | POA: Diagnosis not present

## 2014-04-24 DIAGNOSIS — Z3A29 29 weeks gestation of pregnancy: Secondary | ICD-10-CM | POA: Diagnosis not present

## 2014-04-24 DIAGNOSIS — Z3493 Encounter for supervision of normal pregnancy, unspecified, third trimester: Secondary | ICD-10-CM

## 2014-04-24 LAB — TYPE AND SCREEN
ABO/RH(D): A NEG
Antibody Screen: POSITIVE
DAT, IgG: NEGATIVE
Unit division: 0
Unit division: 0

## 2014-04-24 LAB — POCT URINALYSIS DIP (DEVICE)
Bilirubin Urine: NEGATIVE
Glucose, UA: NEGATIVE mg/dL
Hgb urine dipstick: NEGATIVE
Ketones, ur: NEGATIVE mg/dL
Leukocytes, UA: NEGATIVE
Nitrite: NEGATIVE
Protein, ur: NEGATIVE mg/dL
Specific Gravity, Urine: 1.01 (ref 1.005–1.030)
Urobilinogen, UA: 0.2 mg/dL (ref 0.0–1.0)
pH: 6 (ref 5.0–8.0)

## 2014-04-24 MED ORDER — PHENYLEPHRINE IN HARD FAT 0.25 % RE SUPP: 1.0000 | Freq: Two times a day (BID) | RECTAL | Status: DC

## 2014-04-24 NOTE — Patient Instructions (Addendum)
Constipation Constipation is when a person has fewer than three bowel movements a week, has difficulty having a bowel movement, or has stools that are dry, hard, or larger than normal. As people grow older, constipation is more common. If you try to fix constipation with medicines that make you have a bowel movement (laxatives), the problem may get worse. Long-term laxative use may cause the muscles of the colon to become weak. A low-fiber diet, not taking in enough fluids, and taking certain medicines may make constipation worse.  CAUSES  1. Certain medicines, such as antidepressants, pain medicine, iron supplements, antacids, and water pills.  2. Certain diseases, such as diabetes, irritable bowel syndrome (IBS), thyroid disease, or depression.  3. Not drinking enough water.  4. Not eating enough fiber-rich foods.  5. Stress or travel.  6. Lack of physical activity or exercise.  7. Ignoring the urge to have a bowel movement.  8. Using laxatives too much.  SIGNS AND SYMPTOMS  1. Having fewer than three bowel movements a week.  2. Straining to have a bowel movement.  3. Having stools that are hard, dry, or larger than normal.  4. Feeling full or bloated.  5. Pain in the lower abdomen.  6. Not feeling relief after having a bowel movement.  DIAGNOSIS  Your health care provider will take a medical history and perform a physical exam. Further testing may be done for severe constipation. Some tests may include: 1. A barium enema X-ray to examine your rectum, colon, and, sometimes, your small intestine.  2. A sigmoidoscopy to examine your lower colon.  3. A colonoscopy to examine your entire colon. TREATMENT  Treatment will depend on the severity of your constipation and what is causing it. Some dietary treatments include drinking more fluids and eating more fiber-rich foods. Lifestyle treatments may include regular exercise. If these diet and lifestyle recommendations do not help,  your health care provider may recommend taking over-the-counter laxative medicines to help you have bowel movements. Prescription medicines may be prescribed if over-the-counter medicines do not work.  HOME CARE INSTRUCTIONS   Eat foods that have a lot of fiber, such as fruits, vegetables, whole grains, and beans.  Limit foods high in fat and processed sugars, such as french fries, hamburgers, cookies, candies, and soda.   A fiber supplement may be added to your diet if you cannot get enough fiber from foods.   Drink enough fluids to keep your urine clear or pale yellow.   Exercise regularly or as directed by your health care provider.   Go to the restroom when you have the urge to go. Do not hold it.   Only take over-the-counter or prescription medicines as directed by your health care provider. Do not take other medicines for constipation without talking to your health care provider first.  [place] IF:   You have bright red blood in your stool.   Your constipation lasts for more than 4 days or gets worse.   You have abdominal or rectal pain.   You have thin, pencil-like stools.   You have unexplained weight loss. MAKE SURE YOU:   Understand these instructions.  Will watch your condition.  Will get help right away if you are not doing well or get worse. Document Released: 11/15/2003 Document Revised: 02/21/2013 Document Reviewed: 11/28/2012 [facility] Patient Information 2015 [place], [place]. This information is not intended to replace advice given to you by your health care provider. Make sure you discuss any questions  you have with your health care provider. About Hemorrhoids  Hemorrhoids are swollen veins in the lower rectum and anus.  Also called piles, hemorrhoids are a common problem.  Hemorrhoids may be internal (inside the rectum) or external (around the anus).  Internal Hemorrhoids  Internal hemorrhoids are often painless, but they  rarely cause bleeding.  The internal veins may stretch and fall down (prolapse) through the anus to the outside of the body.  The veins may then become irritated and painful.  External Hemorrhoids  External hemorrhoids can be easily seen or felt around the anal opening.  They are under the skin around the anus.  When the swollen veins are scratched or broken by straining, rubbing or wiping they sometimes bleed.  How Hemorrhoids Occur  Veins in the rectum and around the anus tend to swell under pressure.  Hemorrhoids can result from increased pressure in the veins of your anus or rectum.  Some sources of pressure are:   Straining to have a bowel movement because of constipation  Waiting too long to have a bowel movement  Coughing and sneezing often  Sitting for extended periods of time, including on the toilet  Diarrhea  Obesity  Trauma or injury to the anus  Some liver diseases  Stress  Family history of hemorrhoids  Pregnancy  Pregnant women should try to avoid becoming constipated, because they are more likely to have hemorrhoids during pregnancy.  In the last trimester of pregnancy, the enlarged uterus may press on blood vessels and causes hemorrhoids.  In addition, the strain of childbirth sometimes causes hemorrhoids after the birth.  Symptoms of Hemorrhoids  Some symptoms of hemorrhoids include:  Swelling and/or a tender lump around the anus  Itching, mild burning and bleeding around the anus  Painful bowel movements with or without constipation  Bright red blood covering the stool, on toilet paper or in the toilet bowel.   Symptoms usually go away within a few days.  Always talk to your doctor about any bleeding to make sure it is not from some other causes.  Diagnosing and Treating Hemorrhoids  Diagnosis is made by an examination by your healthcare provider.  Special test can be performed by your doctor.    Most cases of hemorrhoids can be treated  with:  High-fiber diet: Eat more high-fiber foods, which help prevent constipation.  Ask for more detailed fiber information on types and sources of fiber from your healthcare provider.  Fluids: Drink plenty of water.  This helps soften bowel movements so they are easier to pass.  Sitz baths and cold packs: Sitting in lukewarm water two or three times a day for 15 minutes cleases the anal area and may relieve discomfort.  If the water is too hot, swelling around the anus will get worse.  Placing a cloth-covered ice pack on the anus for ten minutes four times a day can also help reduce selling.  Gently pushing a prolapsed hemorrhoid back inside after the bath or ice pack can be helpful.  Medications: For mild discomfort, your healthcare provider may suggest over-the-counter pain medication or prescribe a cream or ointment for topical use.  The cream may contain witch hazel, zinc oxide or petroleum jelly.  Medicated suppositories are also a treatment option.  Always consult your doctor before applying medications or creams.  Procedures and surgeries: There are also a number of procedures and surgeries to shrink or remove hemorrhoids in more serious cases.  Talk to your physician about these options.  You can often prevent hemorrhoids or keep them from becoming worse by maintaining a healthy lifestyle.  Eat a fiber-rich diet of fruits, vegetables and whole grains.  Also, drink plenty of water and exercise regularly.   2007, [employer] Doc.30

## 2014-04-24 NOTE — Progress Notes (Signed)
Edema- feet  Pt has doppler study and [place] from MFM today @ 1030am Pt reports going to MAU on 04/20/14 for constipation where they did an enema; Pt states her "bottom still hurts"

## 2014-04-24 NOTE — Progress Notes (Signed)
Nsg note reviewed. Using Miralax daily, is bloated. Fleet enema ok to use at home. External hemorrhoid noted,

## 2014-04-30 ENCOUNTER — Encounter: Payer: Self-pay | Admitting: Physician Assistant

## 2014-04-30 ENCOUNTER — Ambulatory Visit (INDEPENDENT_AMBULATORY_CARE_PROVIDER_SITE_OTHER): Payer: Medicaid Other | Admitting: Physician Assistant

## 2014-04-30 VITALS — BP 114/60 | HR 100 | Ht 58.5 in | Wt 177.5 lb

## 2014-04-30 DIAGNOSIS — R1013 Epigastric pain: Secondary | ICD-10-CM

## 2014-04-30 DIAGNOSIS — K5909 Other constipation: Secondary | ICD-10-CM

## 2014-04-30 DIAGNOSIS — O0001 Abdominal pregnancy with intrauterine pregnancy: Secondary | ICD-10-CM

## 2014-04-30 DIAGNOSIS — O000 Abdominal pregnancy: Secondary | ICD-10-CM

## 2014-04-30 DIAGNOSIS — K219 Gastro-esophageal reflux disease without esophagitis: Secondary | ICD-10-CM

## 2014-04-30 MED ORDER — PROMETHAZINE HCL 12.5 MG PO TABS: 12.5000 mg | ORAL_TABLET | Freq: Four times a day (QID) | ORAL | Status: DC | PRN

## 2014-04-30 MED ORDER — PROMETHAZINE HCL 12.5 MG RE SUPP: 12.5000 mg | Freq: Four times a day (QID) | RECTAL | Status: DC | PRN

## 2014-04-30 MED ORDER — LANSOPRAZOLE 30 MG PO CPDR: DELAYED_RELEASE_CAPSULE | ORAL | Status: DC

## 2014-04-30 NOTE — Progress Notes (Signed)
Reviewed and agree with management plan. Her OB had previously recommended Protonix.  Pricilla Riffle. Fuller Plan, MD [facility]

## 2014-04-30 NOTE — Patient Instructions (Signed)
Stop Zofran Continue Miralax 1 capful daily Add "P" fruits to your diet  Peaches, Pears,Plums ,Pineapple  Ect...  Call in 2 weeks to let Cecille Rubin know how you are doing

## 2014-04-30 NOTE — Progress Notes (Signed)
Patient ID: Sherri [name], female   DOB: November 28, 1985, 29 y.o.   MRN: [id number]    HPI:    Sherri [name] is a delightful 29 year old female referred for evaluation by Dr. Ramond Craver due to epigastric pain.  [name] is [redacted] weeks pregnant with twins. She is due May 5. She states that she has been having epigastric pain and heartburn on a constant basis for the past several months. Her pain is worse after ingestion of meals. She gets a lot of heartburn. She has been having intermittent dysphagia to solids but not to liquids. She has been using ranitidine with very little relief and she has tried toms with no relief. She feels as her pregnancy progresses her nausea has been getting worse she vomits several nights per week before she goes to bed. She has no right upper quadrant pain radiating to the scapula.  She was admitted to [facility] February 16 for abdominal cramping she had been discharged on the 15th after an admission for threatened preterm labor and received 2 doses of betamethasone and 12 hours of magnesia him. When admitted on the 16th she complained of epigastric abdominal pain, nausea, and headache. She was observed for 14 hours and no obstruction, reason for have abdominal pain was noted it was felt that the pain was epigastric in nature possibly a gastritis. Per discharge note she was sent out on pantoprazole and ranitidine, however as of today the patient is not taking pantoprazole and does not recall ever having a prescription for it.  She also complains of constipation. She will sometimes skip a day or 2 between bowel movements and then passes hard nuggets. She has been seen by her obstetrician for this and advised to use Mira lax and Colace but has not been doing this on a regular basis. She has had no bright red blood per rectum.   Past Medical History  Diagnosis Date  . Uterine fibroid   . Fibromyalgia   . Hypertension   . Urinary tract infection     Past Surgical History    Procedure Laterality Date  . Wisdom tooth extraction     Family History  Problem Relation Age of Onset  . Colon cancer Neg Hx   . Colon polyps Neg Hx   . Kidney disease Neg Hx   . Diabetes Neg Hx   . Esophageal cancer Neg Hx   . Gallbladder disease Neg Hx   . Heart disease Neg Hx    History  Substance Use Topics  . Smoking status: Never Smoker   . Smokeless tobacco: Never Used  . Alcohol Use: 0.0 oz/week    0 Standard drinks or equivalent per week     Comment: occasional   Current Outpatient Prescriptions  Medication Sig Dispense Refill  . acetaminophen (TYLENOL) 325 MG tablet Take 2 tablets (650 mg total) by mouth every 6 (six) hours as needed for mild pain or headache. 30 tablet 1  . bisacodyl (DULCOLAX) 10 MG suppository Place 10 mg rectally as needed for moderate constipation.    . cyclobenzaprine (FLEXERIL) 10 MG tablet Take 1 tablet (10 mg total) by mouth 3 (three) times daily as needed for muscle spasms. 30 tablet 0  . docusate sodium (COLACE) 100 MG capsule Take 1 capsule (100 mg total) by mouth 2 (two) times daily. 60 capsule 3  . phenylephrine (,USE FOR PREPARATION-H,) 0.25 % suppository Place 1 suppository rectally 2 (two) times daily. 12 suppository 0  . polyethylene glycol (MIRALAX / GLYCOLAX)  packet Take 17 g by mouth daily. 14 each 1  . Prenatal Vit-Fe Fumarate-FA (PRENATAL MULTIVITAMIN) TABS tablet Take 1 tablet by mouth daily.     . ranitidine (ZANTAC) 300 MG tablet Take 1 tablet (300 mg total) by mouth at bedtime. 30 tablet 3  . scopolamine (TRANSDERM-SCOP) 1 MG/3DAYS Place 1 patch (1.5 mg total) onto the skin every 3 (three) days. 10 patch 12  . lansoprazole (PREVACID) 30 MG capsule 1 capsule 30 minutes before breakfast 60 capsule 3  . promethazine (PHENERGAN) 12.5 MG tablet Take 1 tablet (12.5 mg total) by mouth every 6 (six) hours as needed for nausea or vomiting. 90 tablet 3   No current facility-administered medications for this visit.   No Known  Allergies   Review of Systems: Gen: Denies any fever, chills, sweats, anorexia, fatigue, weakness, malaise, weight loss, and sleep disorder CV: Denies chest pain, angina, palpitations, syncope, orthopnea, PND, peripheral edema, and claudication. Resp: Denies dyspnea at rest, dyspnea with exercise, cough, sputum, wheezing, coughing up blood, and pleurisy. GI: Denies vomiting blood, jaundice, and fecal incontinence.  Has intermittent dysphagia to solids. GU : Denies urinary burning, blood in urine, urinary frequency, urinary hesitancy, nocturnal urination, and urinary incontinence. MS: Denies joint pain, limitation of movement, and swelling, stiffness, low back pain, extremity pain. Denies muscle weakness, cramps, atrophy.  Derm: Denies rash, itching, dry skin, hives, moles, warts, or unhealing ulcers.  Psych: Denies depression, anxiety, memory loss, suicidal ideation, hallucinations, paranoia, and confusion. Heme: Denies bruising, bleeding, and enlarged lymph nodes. Neuro:  Denies any headaches, dizziness, paresthesias. Endo:  Denies any problems with DM, thyroid, adrenal function  Studies: Show images for US Abdomen Complete     Study Result     CLINICAL DATA: Epigastric pain for 1 week.  EXAM: ULTRASOUND ABDOMEN COMPLETE  COMPARISON: CT 10/11/2013 ultrasound 12/25/2010  FINDINGS: Gallbladder: No gallstones or wall thickening visualized. No sonographic Murphy sign noted.  Common bile duct: Diameter: 4.5 mm  Liver: No focal lesion identified. Within normal limits in parenchymal echogenicity.  IVC: No abnormality visualized.  Pancreas: Visualized portion unremarkable.  Spleen: Size and appearance within normal limits.  Right Kidney: Length: 12.2 cm. Echogenicity within normal limits. No mass or hydronephrosis visualized.  Left Kidney: Length: 11.0 cm. Echogenicity within normal limits. No mass or hydronephrosis visualized.  Abdominal aorta: No aneurysm  visualized.  Other findings: None.  IMPRESSION: No significant abnormality   Electronically Signed  By: Andreas Newport M.D.  On: 04/17/2014 04:35     LAB RESULTS: Blood work on 04/20/2014 shows an AST 22, ALT 19, alkaline phosphatase 170, total bili 0.4,    Physical Exam: BP 114/60 mmHg  Pulse 100  Ht 4' 10.5" (1.486 m)  Wt 177 lb 8 oz (80.513 kg)  BMI 36.46 kg/m2  LMP 10/03/2013 Constitutional: Pleasant,well-developed, pregnant female in no acute distress. HEENT: Normocephalic and atraumatic. Conjunctivae are normal. No scleral icterus. Neck supple. No JVD Cardiovascular: Normal rate, regular rhythm.  Pulmonary/chest: Effort normal and breath sounds normal. No wheezing, rales or rhonchi. Abdominal: Soft, gravid, . Bowel sounds active throughout. There are no masses palpable. No hepatomegaly.mild epigastric tenderness, no fundal tenderness. Extremities: 1+ edema lower extremities Lymphadenopathy: No cervical adenopathy noted. Neurological: Alert and oriented to person place and time. Skin: Skin is warm and dry. No rashes noted. Psychiatric: Normal mood and affect. Behavior is normal.  ASSESSMENT AND PLAN: #1. Epigastric pain and heartburn. This is likely due to increased abdominal pressure from her pregnancy. An antireflux regimen  has been reviewed with her at length and she has been instructed to eat 6 small meals rather than 3 large meals, and to avoid eating for 3 hours before she goes to bed. She admits that she has been eating chocolate on a daily basis and she was instructed to limit her use of chocolate, colas, coffee, tea, minutes, spicy foods, and citrus foods. She will be given a trial of Prevacid 30 mg by mouth every morning 30 minutes to breakfast and will continue her ranitidine in the evening. She has been instructed to call us in 2 weeks and let us know how she is feeling  #2. Constipation. She's been advised to increase fiber in her diet and increase  her water intake she has been instructed to use her Mira lax one capful daily along with Colace as needed. She was also instructed to add "P fruits" like peaches, pears, prunes, plums, pineapple to help regulate her stools. Again, she has been instructed to call us in 2 weeks and let us know how she is doing.    Haasini Patnaude, Deloris Ping 04/30/2014, 3:38 PM   CC: Dr. Nehemiah Settle

## 2014-05-01 ENCOUNTER — Ambulatory Visit (HOSPITAL_COMMUNITY): Admission: RE | Admit: 2014-05-01 | Payer: Medicaid Other | Source: Ambulatory Visit

## 2014-05-02 ENCOUNTER — Inpatient Hospital Stay (HOSPITAL_COMMUNITY)
Admission: AD | Admit: 2014-05-02 | Discharge: 2014-05-02 | Disposition: A | Discharge status: Home / Self Care | Payer: Medicaid Other | Source: Ambulatory Visit | Attending: Obstetrics & Gynecology | Admitting: Obstetrics & Gynecology

## 2014-05-02 ENCOUNTER — Ambulatory Visit (HOSPITAL_COMMUNITY)
Admission: RE | Admit: 2014-05-02 | Discharge: 2014-05-02 | Disposition: A | Discharge status: Home / Self Care | Payer: Medicaid Other | Source: Ambulatory Visit | Attending: Obstetrics & Gynecology | Admitting: Obstetrics & Gynecology

## 2014-05-02 ENCOUNTER — Ambulatory Visit (INDEPENDENT_AMBULATORY_CARE_PROVIDER_SITE_OTHER): Payer: Medicaid Other | Admitting: Obstetrics & Gynecology

## 2014-05-02 ENCOUNTER — Encounter (HOSPITAL_COMMUNITY): Payer: Self-pay | Admitting: *Deleted

## 2014-05-02 ENCOUNTER — Other Ambulatory Visit (HOSPITAL_COMMUNITY): Payer: Self-pay | Admitting: Maternal and Fetal Medicine

## 2014-05-02 ENCOUNTER — Encounter (HOSPITAL_COMMUNITY): Payer: Self-pay

## 2014-05-02 VITALS — BP 134/80 | HR 100 | Wt 181.5 lb

## 2014-05-02 DIAGNOSIS — O212 Late vomiting of pregnancy: Secondary | ICD-10-CM | POA: Insufficient documentation

## 2014-05-02 DIAGNOSIS — IMO0001 Reserved for inherently not codable concepts without codable children: Secondary | ICD-10-CM | POA: Insufficient documentation

## 2014-05-02 DIAGNOSIS — O30033 Twin pregnancy, monochorionic/diamniotic, third trimester: Secondary | ICD-10-CM | POA: Diagnosis present

## 2014-05-02 DIAGNOSIS — O26872 Cervical shortening, second trimester: Secondary | ICD-10-CM

## 2014-05-02 DIAGNOSIS — O3413 Maternal care for benign tumor of corpus uteri, third trimester: Secondary | ICD-10-CM | POA: Diagnosis not present

## 2014-05-02 DIAGNOSIS — Z3A30 30 weeks gestation of pregnancy: Secondary | ICD-10-CM | POA: Diagnosis not present

## 2014-05-02 DIAGNOSIS — O30003 Twin pregnancy, unspecified number of placenta and unspecified number of amniotic sacs, third trimester: Secondary | ICD-10-CM | POA: Diagnosis not present

## 2014-05-02 DIAGNOSIS — K219 Gastro-esophageal reflux disease without esophagitis: Secondary | ICD-10-CM | POA: Diagnosis not present

## 2014-05-02 DIAGNOSIS — D259 Leiomyoma of uterus, unspecified: Secondary | ICD-10-CM | POA: Insufficient documentation

## 2014-05-02 DIAGNOSIS — O99613 Diseases of the digestive system complicating pregnancy, third trimester: Secondary | ICD-10-CM | POA: Insufficient documentation

## 2014-05-02 DIAGNOSIS — O219 Vomiting of pregnancy, unspecified: Secondary | ICD-10-CM

## 2014-05-02 LAB — POCT URINALYSIS DIP (DEVICE)
Bilirubin Urine: NEGATIVE
Glucose, UA: NEGATIVE mg/dL
Hgb urine dipstick: NEGATIVE
Ketones, ur: NEGATIVE mg/dL
Leukocytes, UA: NEGATIVE
Nitrite: NEGATIVE
Protein, ur: 30 mg/dL — AB
Specific Gravity, Urine: 1.02 (ref 1.005–1.030)
Urobilinogen, UA: 1 mg/dL (ref 0.0–1.0)
pH: 6 (ref 5.0–8.0)

## 2014-05-02 MED ORDER — ONDANSETRON HCL 4 MG PO TABS
8.0000 mg | ORAL_TABLET | Freq: Once | ORAL | Status: AC
  Administered 2014-05-02: 8 mg via ORAL
  Filled 2014-05-02: qty 2

## 2014-05-02 MED ORDER — ONDANSETRON 4 MG PO TBDP: 4.0000 mg | ORAL_TABLET | Freq: Three times a day (TID) | ORAL | Status: DC | PRN

## 2014-05-02 MED ORDER — LANSOPRAZOLE 30 MG PO CPDR: 30.0000 mg | DELAYED_RELEASE_CAPSULE | Freq: Two times a day (BID) | ORAL | Status: DC

## 2014-05-02 MED ORDER — GI COCKTAIL ~~LOC~~
30.0000 mL | Freq: Once | ORAL | Status: AC
  Administered 2014-05-02: 30 mL via ORAL
  Filled 2014-05-02: qty 30

## 2014-05-02 NOTE — MAU Provider Note (Signed)
History     CSN: [id number]  Arrival date and time: 05/02/14 1152   None     Chief Complaint  Patient presents with  . Emesis   HPI 29 y.o. G1P0 [redacted]w[redacted]d twin gestation complaint of nausea.  Past Medical History  Diagnosis Date  . Uterine fibroid   . Fibromyalgia   . Hypertension   . Urinary tract infection     Past Surgical History  Procedure Laterality Date  . Wisdom tooth extraction      Family History  Problem Relation Age of Onset  . Colon cancer Neg Hx   . Colon polyps Neg Hx   . Kidney disease Neg Hx   . Diabetes Neg Hx   . Esophageal cancer Neg Hx   . Gallbladder disease Neg Hx   . Heart disease Neg Hx     History  Substance Use Topics  . Smoking status: Never Smoker   . Smokeless tobacco: Never Used  . Alcohol Use: 0.0 oz/week    0 Standard drinks or equivalent per week     Comment: occasional    Allergies: No Known Allergies  Prescriptions prior to admission  Medication Sig Dispense Refill Last Dose  . acetaminophen (TYLENOL) 325 MG tablet Take 2 tablets (650 mg total) by mouth every 6 (six) hours as needed for mild pain or headache. 30 tablet 1 05/01/2014 at Unknown time  . docusate sodium (COLACE) 100 MG capsule Take 1 capsule (100 mg total) by mouth 2 (two) times daily. 60 capsule 3 05/01/2014 at Unknown time  . lansoprazole (PREVACID) 30 MG capsule Take 1 capsule (30 mg total) by mouth 2 (two) times daily before a meal. 1 capsule 30 minutes before breakfast 60 capsule 3 05/02/2014 at Unknown time  . phenylephrine (,USE FOR PREPARATION-H,) 0.25 % suppository Place 1 suppository rectally 2 (two) times daily. 12 suppository 0 05/01/2014 at Unknown time  . polyethylene glycol (MIRALAX / GLYCOLAX) packet Take 17 g by mouth daily. 14 each 1 05/01/2014 at Unknown time  . Prenatal Vit-Fe Fumarate-FA (PRENATAL MULTIVITAMIN) TABS tablet Take 1 tablet by mouth daily.    05/01/2014 at Unknown time  . promethazine (PHENERGAN) 12.5 MG tablet Take 1 tablet (12.5 mg total)  by mouth every 6 (six) hours as needed for nausea or vomiting. 90 tablet 3 05/01/2014 at Unknown time  . scopolamine (TRANSDERM-SCOP) 1 MG/3DAYS Place 1 patch (1.5 mg total) onto the skin every 3 (three) days. 10 patch 12 05/02/2014 at Unknown time  . bisacodyl (DULCOLAX) 10 MG suppository Place 10 mg rectally as needed for moderate constipation.   04/29/2014  . cyclobenzaprine (FLEXERIL) 10 MG tablet Take 1 tablet (10 mg total) by mouth 3 (three) times daily as needed for muscle spasms. (Patient not taking: Reported on 05/02/2014) 30 tablet 0 Not Taking    Review of Systems  Constitutional: Negative.   HENT: Negative.   Eyes: Negative.   Respiratory: Negative.   Cardiovascular: Negative.   Gastrointestinal: Positive for nausea and vomiting.  Genitourinary: Negative.   Musculoskeletal: Negative.   Skin: Negative.   Neurological: Negative.   Endo/Heme/Allergies: Negative.   Psychiatric/Behavioral: Negative.    Physical Exam   Blood pressure 121/80, pulse 108, temperature 98.6 F (37 C), temperature source Oral, resp. rate 18, last menstrual period 10/03/2013.  Physical Exam  Constitutional: She is oriented to person, place, and time. She appears well-developed and well-nourished.  HENT:  Head: Normocephalic and atraumatic.  Neck: Normal range of motion.  Cardiovascular: Normal rate.  Respiratory: Effort normal.  GI: Soft. She exhibits no distension and no mass. There is no tenderness. There is no rebound and no guarding.  Musculoskeletal: Normal range of motion.  Neurological: She is alert and oriented to person, place, and time.  Skin: Skin is warm and dry.  Psychiatric: She has a normal mood and affect. Her behavior is normal. Judgment and thought content normal.    MAU Course  Procedures Gi cocktail Zofran 8mg  PO   Assessment and Plan  Esophageal Reflux Nausea and vomiting of pregnancy  Discharge to home RTC at next scheduled appt   Clemmons,Lori Grissett 05/02/2014,  1:23 PM

## 2014-05-02 NOTE — Patient Instructions (Signed)
Multiple Pregnancy A multiple pregnancy is when a woman is pregnant with two or more fetuses. Multiple pregnancies occur in about 3% of all births. The more babies in a pregnancy, the greater the risks of problems to the babies and mother. This includes death. Since the use of Assisted Reproductive Technology (ART) and medications that can induce ovulation, multiple fetal gestation has increased.  RISKS TO THE MOTHER  Preeclampsia and eclampsia.  Postpartum bleeding (hemorrhage).  Kidney infection (pyelonephritis).  Develop anemia.  Develop diabetes.  Liver complications.  A blood clot blocks the artery, or branch of the artery leading to the lungs (pulmonary embolism).  Blood clots in the leg.  Placental separation.  Higher rate of Cesarean Section deliveries.  Women over 16 years old have a higher rate of Downs Syndrome babies. RISKS TO THE BABY  Preterm labor with a premature baby.  Very low birth weight babies that are less than 3 pounds, especially with triplets or mores.  Premature rupture of the membranes.  Twin to twin blood transfusion with one baby anemic and the other baby with too much blood in its system. There may also be heart failure.  With triplets or more, one of the babies is at high risk for cerebral palsy or other neurologic problem.  There is a higher incidence of fetal death. CARE OF MOTHERS WITH MULTIPLE FETAL GESTATION Multiple pregnancies need more care and special prenatal care.  You will see your caregiver more often.  You will have more tests including ultrasounds, nonstress tests and blood tests.  You will have special tests done called amniocentesis and a biophysical profile.  You may be hospitalized more often during the pregnancy.  You will be encouraged to eat a balanced and healthy diet with vitamin and mineral supplements as directed.  You will be asked to get more rest and sleep to keep up your energy.  You will be asked to  restrict your daily activities, exercise, work, household chores and sexual activity.  If you have preterm labor with small babies, you will be given a steroid injection to help the babies lungs mature and do better when born.  The delivery may have to be by Cesarean delivery, especially if there are triplets or more.  The delivery should be in a hospital with an intensive care nursery and Neonatologists (pediatrician for high risk babies) to care for the newborn babies. HOME CARE INSTRUCTIONS   Follow the caregiver's recommendations regarding office visits, tests for you and the babies, diet, rest and medications.  Avoid a large amount of physical activity.  Arrange to have help after the babies are born and when you go home from the hospital.  Take classes on how to care for multiple babies before you deliver them. SEEK IMMEDIATE MEDICAL CARE IF:   You develop a temperature of 102 F (38.9 C) or higher.  You are leaking fluid from the vagina.  You develop vaginal bleeding.  You develop uterine contractions.  You develop a severe headache, severe upper abdominal pain, visual problems or excessive swelling of your face, hands and feet.  You develop severe back pain or leg pain.  You develop severe tiredness.  You develop chest pain.  You have shortness of breath, fall down or pass out. Document Released: 11/26/2007 Document Revised: 05/11/2011 Document Reviewed: 01/20/2013 [facility] Patient Information 2015 [place], [place]. This information is not intended to replace advice given to you by your health care provider. Make sure you discuss any questions you have with  your health care provider.

## 2014-05-02 NOTE — Discharge Instructions (Signed)
Hyperemesis Gravidarum °Hyperemesis gravidarum is a severe form of nausea and vomiting that happens during pregnancy. Hyperemesis is worse than morning sickness. It may cause you to have nausea or vomiting all day for many days. It may keep you from eating and drinking enough food and liquids. Hyperemesis usually occurs during the first half (the first 20 weeks) of pregnancy. It often goes away once a woman is in her second half of pregnancy. However, sometimes hyperemesis continues through an entire pregnancy.  °CAUSES  °The cause of this condition is not completely known but is thought to be related to changes in the body's hormones when pregnant. It could be from the high level of the pregnancy hormone or an increase in estrogen in the body.  °SIGNS AND SYMPTOMS  °· Severe nausea and vomiting. °· Nausea that does not go away. °· Vomiting that does not allow you to keep any food down. °· Weight loss and body fluid loss (dehydration). °· Having no desire to eat or not liking food you have previously enjoyed. °DIAGNOSIS  °Your health care provider will do a physical exam and ask you about your symptoms. He or she may also order blood tests and urine tests to make sure something else is not causing the problem.  °TREATMENT  °You may only need medicine to control the problem. If medicines do not control the nausea and vomiting, you will be treated in the hospital to prevent dehydration, increased acid in the blood (acidosis), weight loss, and changes in the electrolytes in your body that may harm the unborn baby (fetus). You may need IV fluids.  °HOME CARE INSTRUCTIONS  °· Only take over-the-counter or prescription medicines as directed by your health care provider. °· Try eating a couple of dry crackers or toast in the morning before getting out of bed. °· Avoid foods and smells that upset your stomach. °· Avoid fatty and spicy foods. °· Eat 5-6 small meals a day. °· Do not drink when eating meals. Drink between  meals. °· For snacks, eat high-protein foods, such as cheese. °· Eat or suck on things that have ginger in them. Ginger helps nausea. °· Avoid food preparation. The smell of food can spoil your appetite. °· Avoid iron pills and iron in your multivitamins until after 3-4 months of being pregnant. However, consult with your health care provider before stopping any prescribed iron pills. °SEEK MEDICAL CARE IF:  °· Your abdominal pain increases. °· You have a severe headache. °· You have vision problems. °· You are losing weight. °SEEK IMMEDIATE MEDICAL CARE IF:  °· You are unable to keep fluids down. °· You vomit blood. °· You have constant nausea and vomiting. °· You have excessive weakness. °· You have extreme thirst. °· You have dizziness or fainting. °· You have a fever or persistent symptoms for more than 2-3 days. °· You have a fever and your symptoms suddenly get worse. °MAKE SURE YOU:  °· Understand these instructions. °· Will watch your condition. °· Will get help right away if you are not doing well or get worse. °Document Released: 02/16/2005 Document Revised: 12/07/2012 Document Reviewed: 09/28/2012 °[employer]® Patient Information ©2015 [employer]. This information is not intended to replace advice given to you by your health care provider. Make sure you discuss any questions you have with your health care provider. ° °

## 2014-05-02 NOTE — Progress Notes (Signed)
Heartburn and nausea, increase Prevacid to BID. [place] last week reviewed, to [facility] for f/u now

## 2014-05-02 NOTE — MAU Note (Signed)
Pt presents to MAU after office visit today for nausea and vomiting. Denies any vaginal bleeding or LOF

## 2014-05-03 ENCOUNTER — Encounter: Payer: Self-pay | Admitting: Obstetrics & Gynecology

## 2014-05-03 ENCOUNTER — Encounter: Payer: Medicaid Other | Admitting: Advanced Practice Midwife

## 2014-05-08 ENCOUNTER — Other Ambulatory Visit (HOSPITAL_COMMUNITY): Payer: Self-pay | Admitting: Maternal and Fetal Medicine

## 2014-05-08 ENCOUNTER — Other Ambulatory Visit: Payer: Self-pay | Admitting: Family Medicine

## 2014-05-08 ENCOUNTER — Encounter (HOSPITAL_COMMUNITY): Payer: Self-pay

## 2014-05-08 ENCOUNTER — Ambulatory Visit (HOSPITAL_COMMUNITY)
Admission: RE | Admit: 2014-05-08 | Discharge: 2014-05-08 | Disposition: A | Discharge status: Home / Self Care | Payer: Medicaid Other | Source: Ambulatory Visit | Attending: Family Medicine | Admitting: Family Medicine

## 2014-05-08 VITALS — BP 142/86 | HR 110 | Wt 181.8 lb

## 2014-05-08 DIAGNOSIS — IMO0001 Reserved for inherently not codable concepts without codable children: Secondary | ICD-10-CM

## 2014-05-08 DIAGNOSIS — O3413 Maternal care for benign tumor of corpus uteri, third trimester: Secondary | ICD-10-CM | POA: Diagnosis not present

## 2014-05-08 DIAGNOSIS — Z3A31 31 weeks gestation of pregnancy: Secondary | ICD-10-CM | POA: Insufficient documentation

## 2014-05-08 DIAGNOSIS — O283 Abnormal ultrasonic finding on antenatal screening of mother: Secondary | ICD-10-CM

## 2014-05-08 DIAGNOSIS — O30033 Twin pregnancy, monochorionic/diamniotic, third trimester: Secondary | ICD-10-CM | POA: Insufficient documentation

## 2014-05-08 DIAGNOSIS — D259 Leiomyoma of uterus, unspecified: Secondary | ICD-10-CM | POA: Insufficient documentation

## 2014-05-08 DIAGNOSIS — O365931 Maternal care for other known or suspected poor fetal growth, third trimester, fetus 1: Secondary | ICD-10-CM | POA: Diagnosis not present

## 2014-05-08 DIAGNOSIS — O365930 Maternal care for other known or suspected poor fetal growth, third trimester, not applicable or unspecified: Secondary | ICD-10-CM | POA: Diagnosis present

## 2014-05-14 ENCOUNTER — Encounter: Payer: Self-pay | Admitting: Family Medicine

## 2014-05-14 ENCOUNTER — Ambulatory Visit (INDEPENDENT_AMBULATORY_CARE_PROVIDER_SITE_OTHER): Payer: Medicaid Other | Admitting: Family Medicine

## 2014-05-14 VITALS — BP 137/72 | HR 102 | Temp 97.7°F | Wt 184.2 lb

## 2014-05-14 DIAGNOSIS — R300 Dysuria: Secondary | ICD-10-CM

## 2014-05-14 DIAGNOSIS — O289 Unspecified abnormal findings on antenatal screening of mother: Secondary | ICD-10-CM

## 2014-05-14 DIAGNOSIS — O4103X2 Oligohydramnios, third trimester, fetus 2: Secondary | ICD-10-CM

## 2014-05-14 DIAGNOSIS — O283 Abnormal ultrasonic finding on antenatal screening of mother: Secondary | ICD-10-CM

## 2014-05-14 LAB — POCT URINALYSIS DIP (DEVICE)
Bilirubin Urine: NEGATIVE
Glucose, UA: NEGATIVE mg/dL
Hgb urine dipstick: NEGATIVE
Nitrite: NEGATIVE
Protein, ur: 30 mg/dL — AB
Specific Gravity, Urine: 1.02 (ref 1.005–1.030)
Urobilinogen, UA: 1 mg/dL (ref 0.0–1.0)
pH: 6.5 (ref 5.0–8.0)

## 2014-05-14 NOTE — Progress Notes (Signed)
C/o lots of pelvic pressure

## 2014-05-14 NOTE — Patient Instructions (Signed)
Third Trimester of Pregnancy The third trimester is from week 29 through week 42, months 7 through 9. The third trimester is a time when the fetus is growing rapidly. At the end of the ninth month, the fetus is about 20 inches in length and weighs 6-10 pounds.  BODY CHANGES Your body goes through many changes during pregnancy. The changes vary from woman to woman.   Your weight will continue to increase. You can expect to gain 25-35 pounds (11-16 kg) by the end of the pregnancy.  You may begin to get stretch marks on your hips, abdomen, and breasts.  You may urinate more often because the fetus is moving lower into your pelvis and pressing on your bladder.  You may develop or continue to have heartburn as a result of your pregnancy.  You may develop constipation because certain hormones are causing the muscles that push waste through your intestines to slow down.  You may develop hemorrhoids or swollen, bulging veins (varicose veins).  You may have pelvic pain because of the weight gain and pregnancy hormones relaxing your joints between the bones in your pelvis. Backaches may result from overexertion of the muscles supporting your posture.  You may have changes in your hair. These can include thickening of your hair, rapid growth, and changes in texture. Some women also have hair loss during or after pregnancy, or hair that feels dry or thin. Your hair will most likely return to normal after your baby is born.  Your breasts will continue to grow and be tender. A yellow discharge may leak from your breasts called colostrum.  Your belly button may stick out.  You may feel short of breath because of your expanding uterus.  You may notice the fetus "dropping," or moving lower in your abdomen.  You may have a bloody mucus discharge. This usually occurs a few days to a week before labor begins.  Your cervix becomes thin and soft (effaced) near your due date. WHAT TO EXPECT AT YOUR  PRENATAL EXAMS  You will have prenatal exams every 2 weeks until week 36. Then, you will have weekly prenatal exams. During a routine prenatal visit:  You will be weighed to make sure you and the fetus are growing normally.  Your blood pressure is taken.  Your abdomen will be measured to track your baby's growth.  The fetal heartbeat will be listened to.  Any test results from the previous visit will be discussed.  You may have a cervical check near your due date to see if you have effaced. At around 36 weeks, your caregiver will check your cervix. At the same time, your caregiver will also perform a test on the secretions of the vaginal tissue. This test is to determine if a type of bacteria, Group B streptococcus, is present. Your caregiver will explain this further. Your caregiver may ask you:  What your birth plan is.  How you are feeling.  If you are feeling the baby move.  If you have had any abnormal symptoms, such as leaking fluid, bleeding, severe headaches, or abdominal cramping.  If you have any questions. Other tests or screenings that may be performed during your third trimester include:  Blood tests that check for low iron levels (anemia).  Fetal testing to check the health, activity level, and growth of the fetus. Testing is done if you have certain medical conditions or if there are problems during the pregnancy. FALSE LABOR You may feel small, irregular contractions that   eventually go away. These are called Braxton Hicks contractions, or false labor. Contractions may last for hours, days, or even weeks before true labor sets in. If contractions come at regular intervals, intensify, or become painful, it is best to be seen by your caregiver.  SIGNS OF LABOR   Menstrual-like cramps.  Contractions that are 5 minutes apart or less.  Contractions that start on the top of the uterus and spread down to the lower abdomen and back.  A sense of increased pelvic  pressure or back pain.  A watery or bloody mucus discharge that comes from the vagina. If you have any of these signs before the 37th week of pregnancy, call your caregiver right away. You need to go to the hospital to get checked immediately. HOME CARE INSTRUCTIONS   Avoid all smoking, herbs, alcohol, and unprescribed drugs. These chemicals affect the formation and growth of the baby.  Follow your caregiver's instructions regarding medicine use. There are medicines that are either safe or unsafe to take during pregnancy.  Exercise only as directed by your caregiver. Experiencing uterine cramps is a good sign to stop exercising.  Continue to eat regular, healthy meals.  Wear a good support bra for breast tenderness.  Do not use hot tubs, steam rooms, or saunas.  Wear your seat belt at all times when driving.  Avoid raw meat, uncooked cheese, cat litter boxes, and soil used by cats. These carry germs that can cause birth defects in the baby.  Take your prenatal vitamins.  Try taking a stool softener (if your caregiver approves) if you develop constipation. Eat more high-fiber foods, such as fresh vegetables or fruit and whole grains. Drink plenty of fluids to keep your urine clear or pale yellow.  Take warm sitz baths to soothe any pain or discomfort caused by hemorrhoids. Use hemorrhoid cream if your caregiver approves.  If you develop varicose veins, wear support hose. Elevate your feet for 15 minutes, 3-4 times a day. Limit salt in your diet.  Avoid heavy lifting, wear low heal shoes, and practice good posture.  Rest a lot with your legs elevated if you have leg cramps or low back pain.  Visit your dentist if you have not gone during your pregnancy. Use a soft toothbrush to brush your teeth and be gentle when you floss.  A sexual relationship may be continued unless your caregiver directs you otherwise.  Do not travel far distances unless it is absolutely necessary and only  with the approval of your caregiver.  Take prenatal classes to understand, practice, and ask questions about the labor and delivery.  Make a trial run to the hospital.  Pack your hospital bag.  Prepare the baby's nursery.  Continue to go to all your prenatal visits as directed by your caregiver. SEEK MEDICAL CARE IF:  You are unsure if you are in labor or if your water has broken.  You have dizziness.  You have mild pelvic cramps, pelvic pressure, or nagging pain in your abdominal area.  You have persistent nausea, vomiting, or diarrhea.  You have a bad smelling vaginal discharge.  You have pain with urination. SEEK IMMEDIATE MEDICAL CARE IF:   You have a fever.  You are leaking fluid from your vagina.  You have spotting or bleeding from your vagina.  You have severe abdominal cramping or pain.  You have rapid weight loss or gain.  You have shortness of breath with chest pain.  You notice sudden or extreme swelling   of your face, hands, ankles, feet, or legs.  You have not felt your baby move in over an hour.  You have severe headaches that do not go away with medicine.  You have vision changes. Document Released: 02/10/2001 Document Revised: 02/21/2013 Document Reviewed: 04/19/2012 [employer] Patient Information 2015 [employer]. This information is not intended to replace advice given to you by your health care provider. Make sure you discuss any questions you have with your health care provider.  Breastfeeding Deciding to breastfeed is one of the best choices you can make for you and your baby. A change in hormones during pregnancy causes your breast tissue to grow and increases the number and size of your milk ducts. These hormones also allow proteins, sugars, and fats from your blood supply to make breast milk in your milk-producing glands. Hormones prevent breast milk from being released before your baby is born as well as prompt milk flow after birth. Once  breastfeeding has begun, thoughts of your baby, as well as his or her sucking or crying, can stimulate the release of milk from your milk-producing glands.  BENEFITS OF BREASTFEEDING For Your Baby  Your first milk (colostrum) helps your baby's digestive system function better.   There are antibodies in your milk that help your baby fight off infections.   Your baby has a lower incidence of asthma, allergies, and sudden infant death syndrome.   The nutrients in breast milk are better for your baby than infant formulas and are designed uniquely for your baby's needs.   Breast milk improves your baby's brain development.   Your baby is less likely to develop other conditions, such as childhood obesity, asthma, or type 2 diabetes mellitus.  For You   Breastfeeding helps to create a very special bond between you and your baby.   Breastfeeding is convenient. Breast milk is always available at the correct temperature and costs nothing.   Breastfeeding helps to burn calories and helps you lose the weight gained during pregnancy.   Breastfeeding makes your uterus contract to its prepregnancy size faster and slows bleeding (lochia) after you give birth.   Breastfeeding helps to lower your risk of developing type 2 diabetes mellitus, osteoporosis, and breast or ovarian cancer later in life. SIGNS THAT YOUR BABY IS HUNGRY Early Signs of Hunger  Increased alertness or activity.  Stretching.  Movement of the head from side to side.  Movement of the head and opening of the mouth when the corner of the mouth or cheek is stroked (rooting).  Increased sucking sounds, smacking lips, cooing, sighing, or squeaking.  Hand-to-mouth movements.  Increased sucking of fingers or hands. Late Signs of Hunger  Fussing.  Intermittent crying. Extreme Signs of Hunger Signs of extreme hunger will require calming and consoling before your baby will be able to breastfeed successfully. Do not  wait for the following signs of extreme hunger to occur before you initiate breastfeeding:   Restlessness.  A loud, strong cry.   Screaming. BREASTFEEDING BASICS Breastfeeding Initiation  Find a comfortable place to sit or lie down, with your neck and back well supported.  Place a pillow or rolled up blanket under your baby to bring him or her to the level of your breast (if you are seated). Nursing pillows are specially designed to help support your arms and your baby while you breastfeed.  Make sure that your baby's abdomen is facing your abdomen.   Gently massage your breast. With your fingertips, massage from your chest   wall toward your nipple in a circular motion. This encourages milk flow. You may need to continue this action during the feeding if your milk flows slowly.  Support your breast with 4 fingers underneath and your thumb above your nipple. Make sure your fingers are well away from your nipple and your baby's mouth.   Stroke your baby's lips gently with your finger or nipple.   When your baby's mouth is open wide enough, quickly bring your baby to your breast, placing your entire nipple and as much of the colored area around your nipple (areola) as possible into your baby's mouth.   More areola should be visible above your baby's upper lip than below the lower lip.   Your baby's tongue should be between his or her lower gum and your breast.   Ensure that your baby's mouth is correctly positioned around your nipple (latched). Your baby's lips should create a seal on your breast and be turned out (everted).  It is common for your baby to suck about 2-3 minutes in order to start the flow of breast milk. Latching Teaching your baby how to latch on to your breast properly is very important. An improper latch can cause nipple pain and decreased milk supply for you and poor weight gain in your baby. Also, if your baby is not latched onto your nipple properly, he or she  may swallow some air during feeding. This can make your baby fussy. Burping your baby when you switch breasts during the feeding can help to get rid of the air. However, teaching your baby to latch on properly is still the best way to prevent fussiness from swallowing air while breastfeeding. Signs that your baby has successfully latched on to your nipple:    Silent tugging or silent sucking, without causing you pain.   Swallowing heard between every 3-4 sucks.    Muscle movement above and in front of his or her ears while sucking.  Signs that your baby has not successfully latched on to nipple:   Sucking sounds or smacking sounds from your baby while breastfeeding.  Nipple pain. If you think your baby has not latched on correctly, slip your finger into the corner of your baby's mouth to break the suction and place it between your baby's gums. Attempt breastfeeding initiation again. Signs of Successful Breastfeeding Signs from your baby:   A gradual decrease in the number of sucks or complete cessation of sucking.   Falling asleep.   Relaxation of his or her body.   Retention of a small amount of milk in his or her mouth.   Letting go of your breast by himself or herself. Signs from you:  Breasts that have increased in firmness, weight, and size 1-3 hours after feeding.   Breasts that are softer immediately after breastfeeding.  Increased milk volume, as well as a change in milk consistency and color by the fifth day of breastfeeding.   Nipples that are not sore, cracked, or bleeding. Signs That Your Baby is Getting Enough Milk  Wetting at least 3 diapers in a 24-hour period. The urine should be clear and pale yellow by age 5 days.  At least 3 stools in a 24-hour period by age 5 days. The stool should be soft and yellow.  At least 3 stools in a 24-hour period by age 7 days. The stool should be seedy and yellow.  No loss of weight greater than 10% of birth weight  during the first 3   days of age.  Average weight gain of 4-7 ounces (113-198 g) per week after age 4 days.  Consistent daily weight gain by age 5 days, without weight loss after the age of 2 weeks. After a feeding, your baby may spit up a small amount. This is common. BREASTFEEDING FREQUENCY AND DURATION Frequent feeding will help you make more milk and can prevent sore nipples and breast engorgement. Breastfeed when you feel the need to reduce the fullness of your breasts or when your baby shows signs of hunger. This is called "breastfeeding on demand." Avoid introducing a pacifier to your baby while you are working to establish breastfeeding (the first 4-6 weeks after your baby is born). After this time you may choose to use a pacifier. Research has shown that pacifier use during the first year of a baby's life decreases the risk of sudden infant death syndrome (SIDS). Allow your baby to feed on each breast as long as he or she wants. Breastfeed until your baby is finished feeding. When your baby unlatches or falls asleep while feeding from the first breast, offer the second breast. Because newborns are often sleepy in the first few weeks of life, you may need to awaken your baby to get him or her to feed. Breastfeeding times will vary from baby to baby. However, the following rules can serve as a guide to help you ensure that your baby is properly fed:  Newborns (babies 4 weeks of age or younger) may breastfeed every 1-3 hours.  Newborns should not go longer than 3 hours during the day or 5 hours during the night without breastfeeding.  You should breastfeed your baby a minimum of 8 times in a 24-hour period until you begin to introduce solid foods to your baby at around 6 months of age. BREAST MILK PUMPING Pumping and storing breast milk allows you to ensure that your baby is exclusively fed your breast milk, even at times when you are unable to breastfeed. This is especially important if you are  going back to work while you are still breastfeeding or when you are not able to be present during feedings. Your lactation consultant can give you guidelines on how long it is safe to store breast milk.  A breast pump is a machine that allows you to pump milk from your breast into a sterile bottle. The pumped breast milk can then be stored in a refrigerator or freezer. Some breast pumps are operated by hand, while others use electricity. Ask your lactation consultant which type will work best for you. Breast pumps can be purchased, but some hospitals and breastfeeding support groups lease breast pumps on a monthly basis. A lactation consultant can teach you how to hand express breast milk, if you prefer not to use a pump.  CARING FOR YOUR BREASTS WHILE YOU BREASTFEED Nipples can become dry, cracked, and sore while breastfeeding. The following recommendations can help keep your breasts moisturized and healthy:  Avoid using soap on your nipples.   Wear a supportive bra. Although not required, special nursing bras and tank tops are designed to allow access to your breasts for breastfeeding without taking off your entire bra or top. Avoid wearing underwire-style bras or extremely tight bras.  Air dry your nipples for 3-4minutes after each feeding.   Use only cotton bra pads to absorb leaked breast milk. Leaking of breast milk between feedings is normal.   Use lanolin on your nipples after breastfeeding. Lanolin helps to maintain your skin's   normal moisture barrier. If you use pure lanolin, you do not need to wash it off before feeding your baby again. Pure lanolin is not toxic to your baby. You may also hand express a few drops of breast milk and gently massage that milk into your nipples and allow the milk to air dry. In the first few weeks after giving birth, some women experience extremely full breasts (engorgement). Engorgement can make your breasts feel heavy, warm, and tender to the touch.  Engorgement peaks within 3-5 days after you give birth. The following recommendations can help ease engorgement:  Completely empty your breasts while breastfeeding or pumping. You may want to start by applying warm, moist heat (in the shower or with warm water-soaked hand towels) just before feeding or pumping. This increases circulation and helps the milk flow. If your baby does not completely empty your breasts while breastfeeding, pump any extra milk after he or she is finished.  Wear a snug bra (nursing or regular) or tank top for 1-2 days to signal your body to slightly decrease milk production.  Apply ice packs to your breasts, unless this is too uncomfortable for you.  Make sure that your baby is latched on and positioned properly while breastfeeding. If engorgement persists after 48 hours of following these recommendations, contact your health care provider or a lactation consultant. OVERALL HEALTH CARE RECOMMENDATIONS WHILE BREASTFEEDING  Eat healthy foods. Alternate between meals and snacks, eating 3 of each per day. Because what you eat affects your breast milk, some of the foods may make your baby more irritable than usual. Avoid eating these foods if you are sure that they are negatively affecting your baby.  Drink milk, fruit juice, and water to satisfy your thirst (about 10 glasses a day).   Rest often, relax, and continue to take your prenatal vitamins to prevent fatigue, stress, and anemia.  Continue breast self-awareness checks.  Avoid chewing and smoking tobacco.  Avoid alcohol and drug use. Some medicines that may be harmful to your baby can pass through breast milk. It is important to ask your health care provider before taking any medicine, including all over-the-counter and prescription medicine as well as vitamin and herbal supplements. It is possible to become pregnant while breastfeeding. If birth control is desired, ask your health care provider about options that  will be safe for your baby. SEEK MEDICAL CARE IF:   You feel like you want to stop breastfeeding or have become frustrated with breastfeeding.  You have painful breasts or nipples.  Your nipples are cracked or bleeding.  Your breasts are red, tender, or warm.  You have a swollen area on either breast.  You have a fever or chills.  You have nausea or vomiting.  You have drainage other than breast milk from your nipples.  Your breasts do not become full before feedings by the fifth day after you give birth.  You feel sad and depressed.  Your baby is too sleepy to eat well.  Your baby is having trouble sleeping.   Your baby is wetting less than 3 diapers in a 24-hour period.  Your baby has less than 3 stools in a 24-hour period.  Your baby's skin or the white part of his or her eyes becomes yellow.   Your baby is not gaining weight by 5 days of age. SEEK IMMEDIATE MEDICAL CARE IF:   Your baby is overly tired (lethargic) and does not want to wake up and feed.  Your baby   develops an unexplained fever. Document Released: 02/16/2005 Document Revised: 02/21/2013 Document Reviewed: 08/10/2012 [employer] Patient Information 2015 [employer]. This information is not intended to replace advice given to you by your health care provider. Make sure you discuss any questions you have with your health care provider.  

## 2014-05-14 NOTE — Progress Notes (Signed)
Borderline BP's For U/S for growth on 3/15 with Dopplers/BPP--needs to start testing due to MC/DA twins and gestational age 28 strong odor in urine and lower abdominal pain--will check urine culture Prevacid did not help upper abdominal pain--? Related to growing uterus

## 2014-05-15 ENCOUNTER — Encounter (HOSPITAL_COMMUNITY): Payer: Self-pay

## 2014-05-15 ENCOUNTER — Ambulatory Visit (HOSPITAL_COMMUNITY)
Admission: RE | Admit: 2014-05-15 | Discharge: 2014-05-15 | Disposition: A | Discharge status: Home / Self Care | Payer: Medicaid Other | Source: Ambulatory Visit | Attending: Family Medicine | Admitting: Family Medicine

## 2014-05-15 ENCOUNTER — Other Ambulatory Visit (HOSPITAL_COMMUNITY): Payer: Self-pay | Admitting: Maternal and Fetal Medicine

## 2014-05-15 DIAGNOSIS — O30033 Twin pregnancy, monochorionic/diamniotic, third trimester: Secondary | ICD-10-CM | POA: Diagnosis present

## 2014-05-15 DIAGNOSIS — O365931 Maternal care for other known or suspected poor fetal growth, third trimester, fetus 1: Secondary | ICD-10-CM | POA: Diagnosis not present

## 2014-05-15 DIAGNOSIS — IMO0001 Reserved for inherently not codable concepts without codable children: Secondary | ICD-10-CM

## 2014-05-15 DIAGNOSIS — Z3A32 32 weeks gestation of pregnancy: Secondary | ICD-10-CM | POA: Insufficient documentation

## 2014-05-17 LAB — CULTURE, OB URINE: Colony Count: 50000

## 2014-05-18 ENCOUNTER — Ambulatory Visit (INDEPENDENT_AMBULATORY_CARE_PROVIDER_SITE_OTHER): Payer: Medicaid Other | Admitting: *Deleted

## 2014-05-18 VITALS — BP 131/65 | HR 114

## 2014-05-18 DIAGNOSIS — O30033 Twin pregnancy, monochorionic/diamniotic, third trimester: Secondary | ICD-10-CM

## 2014-05-18 MED ORDER — ONDANSETRON 4 MG PO TBDP: 4.0000 mg | ORAL_TABLET | Freq: Three times a day (TID) | ORAL | Status: DC | PRN

## 2014-05-18 NOTE — Progress Notes (Signed)
Pt reports daily nausea and vomiting 2-3 times per day despite use of Zofran. Pt is not currently taking Phenergan as previously prescribed. Consult with Dr. Elly Modena - pt may take phenergan 12.5-25mg  every 6 hours in addition to Zofran. Pt cautioned that she may be drowsy from the phenergan.  Pt voiced understanding.

## 2014-05-20 NOTE — Progress Notes (Signed)
3/18 NST reviewed and reactive x 2

## 2014-05-22 ENCOUNTER — Ambulatory Visit (HOSPITAL_COMMUNITY)
Admission: RE | Admit: 2014-05-22 | Discharge: 2014-05-22 | Disposition: A | Discharge status: Home / Self Care | Payer: Medicaid Other | Source: Ambulatory Visit | Attending: Family Medicine | Admitting: Family Medicine

## 2014-05-22 ENCOUNTER — Other Ambulatory Visit (HOSPITAL_COMMUNITY): Payer: Self-pay | Admitting: Maternal and Fetal Medicine

## 2014-05-22 ENCOUNTER — Encounter (HOSPITAL_COMMUNITY): Payer: Self-pay

## 2014-05-22 DIAGNOSIS — O30009 Twin pregnancy, unspecified number of placenta and unspecified number of amniotic sacs, unspecified trimester: Secondary | ICD-10-CM | POA: Insufficient documentation

## 2014-05-22 DIAGNOSIS — O365990 Maternal care for other known or suspected poor fetal growth, unspecified trimester, not applicable or unspecified: Secondary | ICD-10-CM | POA: Diagnosis present

## 2014-05-22 DIAGNOSIS — IMO0001 Reserved for inherently not codable concepts without codable children: Secondary | ICD-10-CM | POA: Insufficient documentation

## 2014-05-22 DIAGNOSIS — Z3A33 33 weeks gestation of pregnancy: Secondary | ICD-10-CM | POA: Insufficient documentation

## 2014-05-24 ENCOUNTER — Ambulatory Visit (INDEPENDENT_AMBULATORY_CARE_PROVIDER_SITE_OTHER): Payer: Medicaid Other | Admitting: Obstetrics & Gynecology

## 2014-05-24 VITALS — BP 136/78 | HR 100 | Wt 183.3 lb

## 2014-05-24 DIAGNOSIS — O30033 Twin pregnancy, monochorionic/diamniotic, third trimester: Secondary | ICD-10-CM

## 2014-05-24 DIAGNOSIS — Z3A32 32 weeks gestation of pregnancy: Secondary | ICD-10-CM

## 2014-05-24 LAB — POCT URINALYSIS DIP (DEVICE)
Bilirubin Urine: NEGATIVE
Glucose, UA: NEGATIVE mg/dL
Hgb urine dipstick: NEGATIVE
Ketones, ur: NEGATIVE mg/dL
Nitrite: NEGATIVE
Protein, ur: NEGATIVE mg/dL
Specific Gravity, Urine: 1.015 (ref 1.005–1.030)
Urobilinogen, UA: 0.2 mg/dL (ref 0.0–1.0)
pH: 6.5 (ref 5.0–8.0)

## 2014-05-24 MED ORDER — METOCLOPRAMIDE HCL 10 MG PO TABS: 10.0000 mg | ORAL_TABLET | Freq: Three times a day (TID) | ORAL | Status: DC

## 2014-05-24 NOTE — Progress Notes (Signed)
!)/!)   BPP3/22 in [place], will repeat on 05/29/14. Still nausea and reflux. Add Reglan 10 mg QID

## 2014-05-24 NOTE — Patient Instructions (Signed)
Multiple Pregnancy A multiple pregnancy is when a woman is pregnant with two or more fetuses. Multiple pregnancies occur in about 3% of all births. The more babies in a pregnancy, the greater the risks of problems to the babies and mother. This includes death. Since the use of Assisted Reproductive Technology (ART) and medications that can induce ovulation, multiple fetal gestation has increased.  RISKS TO THE MOTHER  Preeclampsia and eclampsia.  Postpartum bleeding (hemorrhage).  Kidney infection (pyelonephritis).  Develop anemia.  Develop diabetes.  Liver complications.  A blood clot blocks the artery, or branch of the artery leading to the lungs (pulmonary embolism).  Blood clots in the leg.  Placental separation.  Higher rate of Cesarean Section deliveries.  Women over 66 years old have a higher rate of Downs Syndrome babies. RISKS TO THE BABY  Preterm labor with a premature baby.  Very low birth weight babies that are less than 3 pounds, especially with triplets or mores.  Premature rupture of the membranes.  Twin to twin blood transfusion with one baby anemic and the other baby with too much blood in its system. There may also be heart failure.  With triplets or more, one of the babies is at high risk for cerebral palsy or other neurologic problem.  There is a higher incidence of fetal death. CARE OF MOTHERS WITH MULTIPLE FETAL GESTATION Multiple pregnancies need more care and special prenatal care.  You will see your caregiver more often.  You will have more tests including ultrasounds, nonstress tests and blood tests.  You will have special tests done called amniocentesis and a biophysical profile.  You may be hospitalized more often during the pregnancy.  You will be encouraged to eat a balanced and healthy diet with vitamin and mineral supplements as directed.  You will be asked to get more rest and sleep to keep up your energy.  You will be asked to  restrict your daily activities, exercise, work, household chores and sexual activity.  If you have preterm labor with small babies, you will be given a steroid injection to help the babies lungs mature and do better when born.  The delivery may have to be by Cesarean delivery, especially if there are triplets or more.  The delivery should be in a hospital with an intensive care nursery and Neonatologists (pediatrician for high risk babies) to care for the newborn babies. HOME CARE INSTRUCTIONS   Follow the caregiver's recommendations regarding office visits, tests for you and the babies, diet, rest and medications.  Avoid a large amount of physical activity.  Arrange to have help after the babies are born and when you go home from the hospital.  Take classes on how to care for multiple babies before you deliver them. SEEK IMMEDIATE MEDICAL CARE IF:   You develop a temperature of 102 F (38.9 C) or higher.  You are leaking fluid from the vagina.  You develop vaginal bleeding.  You develop uterine contractions.  You develop a severe headache, severe upper abdominal pain, visual problems or excessive swelling of your face, hands and feet.  You develop severe back pain or leg pain.  You develop severe tiredness.  You develop chest pain.  You have shortness of breath, fall down or pass out. Document Released: 11/26/2007 Document Revised: 05/11/2011 Document Reviewed: 01/20/2013 [facility] Patient Information 2015 [place], [place]. This information is not intended to replace advice given to you by your health care provider. Make sure you discuss any questions you have with  your health care provider.

## 2014-05-29 ENCOUNTER — Encounter (HOSPITAL_COMMUNITY): Payer: Self-pay

## 2014-05-29 ENCOUNTER — Ambulatory Visit (HOSPITAL_COMMUNITY)
Admission: RE | Admit: 2014-05-29 | Discharge: 2014-05-29 | Disposition: A | Discharge status: Home / Self Care | Payer: Medicaid Other | Source: Ambulatory Visit | Attending: Family Medicine | Admitting: Family Medicine

## 2014-05-29 ENCOUNTER — Other Ambulatory Visit (HOSPITAL_COMMUNITY): Payer: Self-pay | Admitting: Maternal and Fetal Medicine

## 2014-05-29 ENCOUNTER — Other Ambulatory Visit: Payer: Self-pay | Admitting: Obstetrics and Gynecology

## 2014-05-29 ENCOUNTER — Other Ambulatory Visit: Payer: Self-pay | Admitting: Family Medicine

## 2014-05-29 DIAGNOSIS — O3413 Maternal care for benign tumor of corpus uteri, third trimester: Secondary | ICD-10-CM | POA: Diagnosis not present

## 2014-05-29 DIAGNOSIS — D259 Leiomyoma of uterus, unspecified: Secondary | ICD-10-CM | POA: Diagnosis not present

## 2014-05-29 DIAGNOSIS — IMO0001 Reserved for inherently not codable concepts without codable children: Secondary | ICD-10-CM

## 2014-05-29 DIAGNOSIS — O365930 Maternal care for other known or suspected poor fetal growth, third trimester, not applicable or unspecified: Secondary | ICD-10-CM | POA: Diagnosis present

## 2014-05-29 DIAGNOSIS — Z3A34 34 weeks gestation of pregnancy: Secondary | ICD-10-CM | POA: Insufficient documentation

## 2014-05-29 DIAGNOSIS — O30033 Twin pregnancy, monochorionic/diamniotic, third trimester: Secondary | ICD-10-CM | POA: Insufficient documentation

## 2014-05-29 DIAGNOSIS — O365931 Maternal care for other known or suspected poor fetal growth, third trimester, fetus 1: Secondary | ICD-10-CM | POA: Diagnosis not present

## 2014-05-31 ENCOUNTER — Encounter: Payer: Medicaid Other | Admitting: Family Medicine

## 2014-06-04 ENCOUNTER — Inpatient Hospital Stay (HOSPITAL_COMMUNITY)
Admission: AD | Admit: 2014-06-04 | Discharge: 2014-06-04 | Disposition: A | Discharge status: Home / Self Care | Payer: Medicaid Other | Source: Ambulatory Visit | Attending: Obstetrics & Gynecology | Admitting: Obstetrics & Gynecology

## 2014-06-04 ENCOUNTER — Encounter (HOSPITAL_COMMUNITY): Payer: Self-pay | Admitting: *Deleted

## 2014-06-04 DIAGNOSIS — O479 False labor, unspecified: Secondary | ICD-10-CM

## 2014-06-04 DIAGNOSIS — Z3A35 35 weeks gestation of pregnancy: Secondary | ICD-10-CM | POA: Insufficient documentation

## 2014-06-04 DIAGNOSIS — N898 Other specified noninflammatory disorders of vagina: Secondary | ICD-10-CM | POA: Insufficient documentation

## 2014-06-04 DIAGNOSIS — O4703 False labor before 37 completed weeks of gestation, third trimester: Secondary | ICD-10-CM

## 2014-06-04 DIAGNOSIS — O4103X2 Oligohydramnios, third trimester, fetus 2: Secondary | ICD-10-CM

## 2014-06-04 NOTE — MAU Provider Note (Signed)
History     CSN: [id number]  Arrival date and time: 06/04/14 6270   First Provider Initiated Contact with Patient 06/04/14 [id number]      Chief Complaint  Patient presents with  . Rupture of Membranes   HPI Pt is a 29 y.o. G1P0 at [redacted]w[redacted]d who presents for fluid leakage and painful contractions for the past week. Denies bleeding. Normal FM x2. Reports that she woke up this am with a large amount of fishy-smelling fluid in her bed. Thinks her water broke.  OB History    Gravida Para Term Preterm AB TAB SAB Ectopic Multiple Living   1         0      Past Medical History  Diagnosis Date  . Uterine fibroid   . Fibromyalgia   . Hypertension   . Urinary tract infection     Past Surgical History  Procedure Laterality Date  . Wisdom tooth extraction      Family History  Problem Relation Age of Onset  . Colon cancer Neg Hx   . Colon polyps Neg Hx   . Kidney disease Neg Hx   . Diabetes Neg Hx   . Esophageal cancer Neg Hx   . Gallbladder disease Neg Hx   . Heart disease Neg Hx     History  Substance Use Topics  . Smoking status: Never Smoker   . Smokeless tobacco: Never Used  . Alcohol Use: 0.0 oz/week    0 Standard drinks or equivalent per week     Comment: occasional when not pregnant    Allergies: No Known Allergies  Prescriptions prior to admission  Medication Sig Dispense Refill Last Dose  . acetaminophen (TYLENOL) 325 MG tablet Take 2 tablets (650 mg total) by mouth every 6 (six) hours as needed for mild pain or headache. 30 tablet 1 Past Week at Unknown time  . bisacodyl (DULCOLAX) 10 MG suppository Place 10 mg rectally as needed for moderate constipation.   Past Week at Unknown time  . cyclobenzaprine (FLEXERIL) 10 MG tablet Take 1 tablet (10 mg total) by mouth 3 (three) times daily as needed for muscle spasms. 30 tablet 0 Past Week at Unknown time  . docusate sodium (COLACE) 100 MG capsule Take 1 capsule (100 mg total) by mouth 2 (two) times daily. 60 capsule 3  Past Week at Unknown time  . lansoprazole (PREVACID) 30 MG capsule Take 1 capsule (30 mg total) by mouth 2 (two) times daily before a meal. 1 capsule 30 minutes before breakfast 60 capsule 3 Past Week at Unknown time  . metoCLOPramide (REGLAN) 10 MG tablet Take 1 tablet (10 mg total) by mouth 4 (four) times daily -  before meals and at bedtime. 90 tablet 1 Past Week at Unknown time  . ondansetron (ZOFRAN-ODT) 4 MG disintegrating tablet DISSOLVE ONE TABLET IN MOUTH EVERY 8 HOURS AS NEEDED FOR NAUSEA AND VOMITING 20 tablet 0 06/03/2014 at Unknown time  . phenylephrine (,USE FOR PREPARATION-H,) 0.25 % suppository Place 1 suppository rectally 2 (two) times daily. 12 suppository 0 Past Week at Unknown time  . polyethylene glycol (MIRALAX / GLYCOLAX) packet Take 17 g by mouth daily. 14 each 1 Past Week at Unknown time  . Prenatal Vit-Fe Fumarate-FA (PRENATAL MULTIVITAMIN) TABS tablet Take 1 tablet by mouth daily.    06/03/2014 at Unknown time  . promethazine (PHENERGAN) 12.5 MG tablet Take 1 tablet (12.5 mg total) by mouth every 6 (six) hours as needed for nausea or vomiting. [place]  tablet 3 06/03/2014 at Unknown time    ROS  See HPI  Physical Exam   Blood pressure 135/78, pulse 96, resp. rate 18, height 4\' 11"  (1.499 m), weight 83.099 kg (183 lb 3.2 oz), last menstrual period 10/03/2013.  Physical Exam  Nursing note and vitals reviewed. Constitutional: She is oriented to person, place, and time. She appears well-developed and well-nourished. No distress.  HENT:  Head: Normocephalic and atraumatic.  Cardiovascular: Normal rate.   Respiratory: Effort normal.  GI: Soft. There is no tenderness.  Genitourinary: Uterus normal. Vaginal discharge found.  Speculum exam with negative pool and negative fern  Neurological: She is alert and oriented to person, place, and time.  Skin: Skin is warm and dry. No rash noted. She is not diaphoretic.  Psychiatric: She has a normal mood and affect. Her behavior is normal.     MAU Course  Procedures  Dilation: Fingertip Effacement (%): 80 Cervical Position: Posterior Station: -3 Presentation: Vertex Exam by:: Dr. Sherril Cong  FHTA: 120/mod var/+accels/-decels/cat 1 FHTB: 110/mod var/+accels/single variable decel with quick return to baseline/ cat 1 Toco: uterine irritability   Assessment and Plan  Braxton hicks and normal vaginal discharge. No cervical change. No clue cells or yeast on fern slide. Discharge home with labor/return precautions.  Beverlyn Roux 06/04/2014, 7:50 AM

## 2014-06-04 NOTE — MAU Note (Signed)
Contractions every 10-12 minutes. Denies bright red vaginal bleeding.  Denies bloody show.  Decreased fetal movement.  Complains of SROM/LOF @ 4975 Denies any Complications of pregnancy except occasional increased BP Unsure of GBS status

## 2014-06-04 NOTE — Discharge Instructions (Signed)
Braxton Hicks Contractions °Contractions of the uterus can occur throughout pregnancy. Contractions are not always a sign that you are in labor.  °WHAT ARE BRAXTON HICKS CONTRACTIONS?  °Contractions that occur before labor are called Braxton Hicks contractions, or false labor. Toward the end of pregnancy (32-34 weeks), these contractions can develop more often and may become more forceful. This is not true labor because these contractions do not result in opening (dilatation) and thinning of the cervix. They are sometimes difficult to tell apart from true labor because these contractions can be forceful and people have different pain tolerances. You should not feel embarrassed if you go to the hospital with false labor. Sometimes, the only way to tell if you are in true labor is for your health care provider to look for changes in the cervix. °If there are no prenatal problems or other health problems associated with the pregnancy, it is completely safe to be sent home with false labor and await the onset of true labor. °HOW CAN YOU TELL THE DIFFERENCE BETWEEN TRUE AND FALSE LABOR? °False Labor °· The contractions of false labor are usually shorter and not as hard as those of true labor.   °· The contractions are usually irregular.   °· The contractions are often felt in the front of the lower abdomen and in the groin.   °· The contractions may go away when you walk around or change positions while lying down.   °· The contractions get weaker and are shorter lasting as time goes on.   °· The contractions do not usually become progressively stronger, regular, and closer together as with true labor.   °True Labor °· Contractions in true labor last 30-70 seconds, become very regular, usually become more intense, and increase in frequency.   °· The contractions do not go away with walking.   °· The discomfort is usually felt in the top of the uterus and spreads to the lower abdomen and low back.   °· True labor can be  determined by your health care provider with an exam. This will show that the cervix is dilating and getting thinner.   °WHAT TO REMEMBER °· Keep up with your usual exercises and follow other instructions given by your health care provider.   °· Take medicines as directed by your health care provider.   °· Keep your regular prenatal appointments.   °· Eat and drink lightly if you think you are going into labor.   °· If Braxton Hicks contractions are making you uncomfortable:   °¨ Change your position from lying down or resting to walking, or from walking to resting.   °¨ Sit and rest in a tub of warm water.   °¨ Drink 2-3 glasses of water. Dehydration may cause these contractions.   °¨ Do slow and deep breathing several times an hour.   °WHEN SHOULD I SEEK IMMEDIATE MEDICAL CARE? °Seek immediate medical care if: °· Your contractions become stronger, more regular, and closer together.   °· You have fluid leaking or gushing from your vagina.   °· You have a fever.   °· You pass blood-tinged mucus.   °· You have vaginal bleeding.   °· You have continuous abdominal pain.   °· You have low back pain that you never had before.   °· You feel your baby's head pushing down and causing pelvic pressure.   °· Your baby is not moving as much as it used to.   °Document Released: 02/16/2005 Document Revised: 02/21/2013 Document Reviewed: 11/28/2012 °[employer]® Patient Information ©2015 [employer]. This information is not intended to replace advice given to you by your health care   provider. Make sure you discuss any questions you have with your health care provider. ° °

## 2014-06-05 ENCOUNTER — Encounter (HOSPITAL_COMMUNITY)
Admission: AD | Disposition: A | Discharge status: Home / Self Care | Payer: Self-pay | Source: Ambulatory Visit | Attending: Family Medicine

## 2014-06-05 ENCOUNTER — Ambulatory Visit (HOSPITAL_COMMUNITY)
Admission: RE | Admit: 2014-06-05 | Discharge: 2014-06-05 | Disposition: A | Discharge status: Home / Self Care | Payer: Medicaid Other | Source: Ambulatory Visit | Attending: Family Medicine | Admitting: Family Medicine

## 2014-06-05 ENCOUNTER — Inpatient Hospital Stay (HOSPITAL_COMMUNITY): Payer: Medicaid Other | Admitting: Anesthesiology

## 2014-06-05 ENCOUNTER — Other Ambulatory Visit (HOSPITAL_COMMUNITY): Payer: Medicaid Other

## 2014-06-05 ENCOUNTER — Inpatient Hospital Stay (HOSPITAL_COMMUNITY): Admit: 2014-06-05 | Payer: Self-pay | Admitting: Family Medicine

## 2014-06-05 ENCOUNTER — Telehealth (HOSPITAL_COMMUNITY): Payer: Self-pay | Admitting: Emergency Medicine

## 2014-06-05 ENCOUNTER — Encounter (HOSPITAL_COMMUNITY): Payer: Self-pay

## 2014-06-05 ENCOUNTER — Other Ambulatory Visit: Payer: Self-pay | Admitting: Family Medicine

## 2014-06-05 ENCOUNTER — Inpatient Hospital Stay (HOSPITAL_COMMUNITY)
Admission: AD | Admit: 2014-06-05 | Discharge: 2014-06-08 | DRG: 765 | Disposition: A | Discharge status: Home / Self Care | Payer: Medicaid Other | Source: Ambulatory Visit | Attending: Family Medicine | Admitting: Family Medicine

## 2014-06-05 VITALS — BP 132/75 | HR 90 | Wt 185.0 lb

## 2014-06-05 DIAGNOSIS — O322XX2 Maternal care for transverse and oblique lie, fetus 2: Secondary | ICD-10-CM | POA: Diagnosis present

## 2014-06-05 DIAGNOSIS — IMO0001 Reserved for inherently not codable concepts without codable children: Secondary | ICD-10-CM

## 2014-06-05 DIAGNOSIS — Z6791 Unspecified blood type, Rh negative: Secondary | ICD-10-CM | POA: Diagnosis not present

## 2014-06-05 DIAGNOSIS — D259 Leiomyoma of uterus, unspecified: Secondary | ICD-10-CM | POA: Diagnosis present

## 2014-06-05 DIAGNOSIS — O365931 Maternal care for other known or suspected poor fetal growth, third trimester, fetus 1: Secondary | ICD-10-CM | POA: Diagnosis not present

## 2014-06-05 DIAGNOSIS — O30033 Twin pregnancy, monochorionic/diamniotic, third trimester: Secondary | ICD-10-CM | POA: Diagnosis not present

## 2014-06-05 DIAGNOSIS — O26893 Other specified pregnancy related conditions, third trimester: Secondary | ICD-10-CM | POA: Diagnosis present

## 2014-06-05 DIAGNOSIS — O26873 Cervical shortening, third trimester: Secondary | ICD-10-CM | POA: Diagnosis present

## 2014-06-05 DIAGNOSIS — O3413 Maternal care for benign tumor of corpus uteri, third trimester: Secondary | ICD-10-CM | POA: Diagnosis present

## 2014-06-05 DIAGNOSIS — O365932 Maternal care for other known or suspected poor fetal growth, third trimester, fetus 2: Secondary | ICD-10-CM | POA: Diagnosis present

## 2014-06-05 DIAGNOSIS — O42913 Preterm premature rupture of membranes, unspecified as to length of time between rupture and onset of labor, third trimester: Principal | ICD-10-CM | POA: Diagnosis present

## 2014-06-05 DIAGNOSIS — O365920 Maternal care for other known or suspected poor fetal growth, second trimester, not applicable or unspecified: Secondary | ICD-10-CM | POA: Diagnosis present

## 2014-06-05 DIAGNOSIS — Z3A35 35 weeks gestation of pregnancy: Secondary | ICD-10-CM | POA: Diagnosis present

## 2014-06-05 DIAGNOSIS — Z98891 History of uterine scar from previous surgery: Secondary | ICD-10-CM

## 2014-06-05 DIAGNOSIS — O4103X2 Oligohydramnios, third trimester, fetus 2: Secondary | ICD-10-CM

## 2014-06-05 LAB — CBC
HCT: 35 % — ABNORMAL LOW (ref 36.0–46.0)
Hemoglobin: 11.5 g/dL — ABNORMAL LOW (ref 12.0–15.0)
MCH: 28.3 pg (ref 26.0–34.0)
MCHC: 32.9 g/dL (ref 30.0–36.0)
MCV: 86.2 fL (ref 78.0–100.0)
Platelets: 165 10*3/uL (ref 150–400)
RBC: 4.06 MIL/uL (ref 3.87–5.11)
RDW: 17.1 % — ABNORMAL HIGH (ref 11.5–15.5)
WBC: 8.9 10*3/uL (ref 4.0–10.5)

## 2014-06-05 SURGERY — Surgical Case
Anesthesia: Spinal | Site: Abdomen

## 2014-06-05 MED ORDER — MORPHINE SULFATE (PF) 0.5 MG/ML IJ SOLN
INTRAMUSCULAR | Status: DC | PRN
  Administered 2014-06-05: .2 mg via INTRATHECAL

## 2014-06-05 MED ORDER — PROMETHAZINE HCL 25 MG/ML IJ SOLN
6.2500 mg | INTRAMUSCULAR | Status: DC | PRN
  Administered 2014-06-06: 12.5 mg via INTRAVENOUS

## 2014-06-05 MED ORDER — DEXAMETHASONE SODIUM PHOSPHATE 10 MG/ML IJ SOLN
INTRAMUSCULAR | Status: DC | PRN
  Administered 2014-06-05: 5 mg via INTRAVENOUS

## 2014-06-05 MED ORDER — OXYTOCIN 10 UNIT/ML IJ SOLN
INTRAMUSCULAR | Status: AC
  Filled 2014-06-05: qty 4

## 2014-06-05 MED ORDER — ONDANSETRON HCL 4 MG/2ML IJ SOLN
INTRAMUSCULAR | Status: AC
  Filled 2014-06-05: qty 2

## 2014-06-05 MED ORDER — MEPERIDINE HCL 25 MG/ML IJ SOLN: 6.2500 mg | INTRAMUSCULAR | Status: DC | PRN

## 2014-06-05 MED ORDER — BUPIVACAINE IN DEXTROSE 0.75-8.25 % IT SOLN
INTRATHECAL | Status: DC | PRN
  Administered 2014-06-05: 1.4 mL via INTRATHECAL

## 2014-06-05 MED ORDER — FAMOTIDINE IN NACL 20-0.9 MG/50ML-% IV SOLN
20.0000 mg | Freq: Once | INTRAVENOUS | Status: AC
  Administered 2014-06-05: 20 mg via INTRAVENOUS
  Filled 2014-06-05: qty 50

## 2014-06-05 MED ORDER — LACTATED RINGERS IV SOLN
INTRAVENOUS | Status: DC
  Administered 2014-06-05 (×2): via INTRAVENOUS

## 2014-06-05 MED ORDER — KETOROLAC TROMETHAMINE 30 MG/ML IJ SOLN
30.0000 mg | Freq: Four times a day (QID) | INTRAMUSCULAR | Status: DC | PRN
  Administered 2014-06-06: 30 mg via INTRAMUSCULAR

## 2014-06-05 MED ORDER — ONDANSETRON HCL 4 MG/2ML IJ SOLN
INTRAMUSCULAR | Status: DC | PRN
  Administered 2014-06-05: 4 mg via INTRAVENOUS

## 2014-06-05 MED ORDER — LACTATED RINGERS IV SOLN
40.0000 [IU] | INTRAVENOUS | Status: DC | PRN
  Administered 2014-06-05: 40 [IU] via INTRAVENOUS

## 2014-06-05 MED ORDER — ALBUTEROL SULFATE HFA 108 (90 BASE) MCG/ACT IN AERS
INHALATION_SPRAY | RESPIRATORY_TRACT | Status: AC
  Filled 2014-06-05: qty 6.7

## 2014-06-05 MED ORDER — FENTANYL CITRATE 0.05 MG/ML IJ SOLN
INTRAMUSCULAR | Status: DC | PRN
  Administered 2014-06-05: 12.5 ug via INTRATHECAL

## 2014-06-05 MED ORDER — CEFAZOLIN SODIUM-DEXTROSE 2-3 GM-% IV SOLR
2.0000 g | INTRAVENOUS | Status: AC
  Administered 2014-06-05: 2 g via INTRAVENOUS
  Filled 2014-06-05: qty 50

## 2014-06-05 MED ORDER — FAMOTIDINE IN NACL 20-0.9 MG/50ML-% IV SOLN
INTRAVENOUS | Status: AC
  Administered 2014-06-05: 20 mg via INTRAVENOUS
  Filled 2014-06-05: qty 50

## 2014-06-05 MED ORDER — KETOROLAC TROMETHAMINE 30 MG/ML IJ SOLN: 30.0000 mg | Freq: Once | INTRAMUSCULAR | Status: AC | PRN

## 2014-06-05 MED ORDER — SCOPOLAMINE 1 MG/3DAYS TD PT72
MEDICATED_PATCH | TRANSDERMAL | Status: DC | PRN
  Administered 2014-06-05: 1 via TRANSDERMAL

## 2014-06-05 MED ORDER — KETOROLAC TROMETHAMINE 30 MG/ML IJ SOLN: 30.0000 mg | Freq: Four times a day (QID) | INTRAMUSCULAR | Status: DC | PRN

## 2014-06-05 MED ORDER — FENTANYL CITRATE 0.05 MG/ML IJ SOLN
INTRAMUSCULAR | Status: AC
  Filled 2014-06-05: qty 2

## 2014-06-05 MED ORDER — PHENYLEPHRINE HCL 10 MG/ML IJ SOLN
INTRAMUSCULAR | Status: DC | PRN
  Administered 2014-06-05 (×2): 80 ug via INTRAVENOUS
  Administered 2014-06-05: 120 ug via INTRAVENOUS

## 2014-06-05 MED ORDER — PHENYLEPHRINE 8 MG IN D5W 100 ML (0.08MG/ML) PREMIX OPTIME
INJECTION | INTRAVENOUS | Status: DC | PRN
  Administered 2014-06-05: 60 ug/min via INTRAVENOUS

## 2014-06-05 MED ORDER — BUPIVACAINE LIPOSOME 1.3 % IJ SUSP
INTRAMUSCULAR | Status: DC | PRN
Start: 2014-06-05 — End: 2014-06-05
  Administered 2014-06-05: 20 mL

## 2014-06-05 MED ORDER — CITRIC ACID-SODIUM CITRATE 334-500 MG/5ML PO SOLN
ORAL | Status: AC
  Filled 2014-06-05: qty 15

## 2014-06-05 MED ORDER — LACTATED RINGERS IV BOLUS (SEPSIS)
1000.0000 mL | Freq: Once | INTRAVENOUS | Status: AC
  Administered 2014-06-05: 1000 mL via INTRAVENOUS

## 2014-06-05 MED ORDER — PHENYLEPHRINE 8 MG IN D5W 100 ML (0.08MG/ML) PREMIX OPTIME
INJECTION | INTRAVENOUS | Status: AC
  Filled 2014-06-05: qty 100

## 2014-06-05 MED ORDER — PHENYLEPHRINE 40 MCG/ML (10ML) SYRINGE FOR IV PUSH (FOR BLOOD PRESSURE SUPPORT)
PREFILLED_SYRINGE | INTRAVENOUS | Status: AC
  Filled 2014-06-05: qty 20

## 2014-06-05 MED ORDER — SODIUM CHLORIDE 0.9 % IR SOLN
Status: DC | PRN
  Administered 2014-06-05: 1000 mL

## 2014-06-05 MED ORDER — BUPIVACAINE HCL (PF) 0.25 % IJ SOLN
INTRAMUSCULAR | Status: DC | PRN
  Administered 2014-06-05: 30 mL

## 2014-06-05 MED ORDER — MORPHINE SULFATE 0.5 MG/ML IJ SOLN
INTRAMUSCULAR | Status: AC
  Filled 2014-06-05: qty 10

## 2014-06-05 MED ORDER — HYDROMORPHONE HCL 1 MG/ML IJ SOLN
0.2500 mg | INTRAMUSCULAR | Status: DC | PRN
  Administered 2014-06-06 (×2): 0.5 mg via INTRAVENOUS

## 2014-06-05 MED ORDER — BUPIVACAINE LIPOSOME 1.3 % IJ SUSP
20.0000 mL | Freq: Once | INTRAMUSCULAR | Status: DC
  Filled 2014-06-05: qty 20

## 2014-06-05 MED ORDER — CITRIC ACID-SODIUM CITRATE 334-500 MG/5ML PO SOLN
30.0000 mL | Freq: Once | ORAL | Status: AC
  Administered 2014-06-05: 30 mL via ORAL
  Filled 2014-06-05: qty 15

## 2014-06-05 SURGICAL SUPPLY — 32 items
BENZOIN TINCTURE PRP APPL 2/3 (GAUZE/BANDAGES/DRESSINGS) ×2 IMPLANT
CATH ROBINSON RED A/P 16FR (CATHETERS) IMPLANT
CLAMP CORD UMBIL (MISCELLANEOUS) ×2 IMPLANT
CLOTH BEACON ORANGE TIMEOUT ST (SAFETY) ×2 IMPLANT
DRAPE SHEET LG 3/4 BI-LAMINATE (DRAPES) IMPLANT
DRSG OPSITE POSTOP 4X10 (GAUZE/BANDAGES/DRESSINGS) ×2 IMPLANT
DURAPREP 26ML APPLICATOR (WOUND CARE) ×2 IMPLANT
ELECT REM PT RETURN 9FT ADLT (ELECTROSURGICAL) ×2
ELECTRODE REM PT RTRN 9FT ADLT (ELECTROSURGICAL) ×1 IMPLANT
EXTRACTOR VACUUM M CUP 4 TUBE (SUCTIONS) IMPLANT
GLOVE BIOGEL PI IND STRL 7.5 (GLOVE) ×2 IMPLANT
GLOVE BIOGEL PI INDICATOR 7.5 (GLOVE) ×2
GLOVE ECLIPSE 7.5 STRL STRAW (GLOVE) ×2 IMPLANT
GOWN STRL REUS W/TWL LRG LVL3 (GOWN DISPOSABLE) ×6 IMPLANT
KIT ABG SYR 3ML LUER SLIP (SYRINGE) IMPLANT
NEEDLE HYPO 22GX1.5 SAFETY (NEEDLE) ×2 IMPLANT
NEEDLE HYPO 25X5/8 SAFETYGLIDE (NEEDLE) IMPLANT
NS IRRIG 1000ML POUR BTL (IV SOLUTION) ×2 IMPLANT
PACK C SECTION WH (CUSTOM PROCEDURE TRAY) ×2 IMPLANT
PAD OB MATERNITY 4.3X12.25 (PERSONAL CARE ITEMS) ×2 IMPLANT
RTRCTR C-SECT PINK 25CM LRG (MISCELLANEOUS) IMPLANT
STRIP CLOSURE SKIN 1/2X4 (GAUZE/BANDAGES/DRESSINGS) ×2 IMPLANT
SUT MNCRL 0 VIOLET CTX 36 (SUTURE) IMPLANT
SUT MONOCRYL 0 CTX 36 (SUTURE)
SUT VIC AB 0 CTX 36 (SUTURE) ×4
SUT VIC AB 0 CTX36XBRD ANBCTRL (SUTURE) ×4 IMPLANT
SUT VIC AB 2-0 CT1 27 (SUTURE) ×2
SUT VIC AB 2-0 CT1 TAPERPNT 27 (SUTURE) ×2 IMPLANT
SUT VIC AB 4-0 KS 27 (SUTURE) ×2 IMPLANT
SYR 30ML LL (SYRINGE) ×2 IMPLANT
TOWEL OR 17X24 6PK STRL BLUE (TOWEL DISPOSABLE) ×2 IMPLANT
TRAY FOLEY CATH SILVER 14FR (SET/KITS/TRAYS/PACK) ×2 IMPLANT

## 2014-06-05 NOTE — Op Note (Addendum)
[facility] PROCEDURE DATE: 06/05/2014  PREOPERATIVE DIAGNOSIS: Intrauterine pregnancy at  [redacted]w[redacted]d weeks gestation; IUGR, malposition of Baby B  POSTOPERATIVE DIAGNOSIS: The same  PROCEDURE: Primary Low Transverse Cesarean Section, with "T" extension in the lower uterine extension.  SURGEON:  Dr. Loma Boston  ASSISTANT: Arville Go, CST  INDICATIONS: Sherri [name] is a 29 y.o. G1P0 at [redacted]w[redacted]d scheduled for cesarean section secondary to IUGR, malposition of Baby B.  The risks of cesarean section discussed with the patient included but were not limited to: bleeding which may require transfusion or reoperation; infection which may require antibiotics; injury to bowel, bladder, ureters or other surrounding organs; injury to the fetus; need for additional procedures including hysterectomy in the event of a life-threatening hemorrhage; placental abnormalities wth subsequent pregnancies, incisional problems, thromboembolic phenomenon and other postoperative/anesthesia complications. The patient concurred with the proposed plan, giving informed written consent for the procedure.    FINDINGS:  Baby A: Viable female infant in vertex presentation.  Apgars 7 and 8, weight, 4 pounds and 2 ounces.  Clear amniotic fluid.  Baby B: Viable female infant, transverse back down lie.  Apgars 8, 9.  Weight 4 pounds, 5 ounces.  Intact placenta, three vessel cord.  Normal uterus, fallopian tubes and ovaries bilaterally.  ANESTHESIA:    Spinal INTRAVENOUS FLUIDS: 2300 ml ESTIMATED BLOOD LOSS: 700 ml URINE OUTPUT:  150 ml SPECIMENS: Placenta sent to pathology COMPLICATIONS: None immediate  PROCEDURE IN DETAIL:  The patient received intravenous antibiotics and had sequential compression devices applied to her lower extremities while in the preoperative area.  She was then taken to the operating room where spinal anesthesia was administered  and was found to be adequate. She was then placed in a dorsal supine position  with a leftward tilt, and prepped and draped in a sterile manner.  A foley catheter was placed into her bladder and attached to constant gravity, which drained clear fluid throughout.  After an adequate timeout was performed, a Pfannenstiel skin incision was made with scalpel and carried through to the underlying layer of fascia. The fascia was incised in the midline and this incision was extended bilaterally using the Mayo scissors. Kocher clamps were applied to the superior aspect of the fascial incision and the underlying rectus muscles were dissected off bluntly. A similar process was carried out on the inferior aspect of the facial incision. The rectus muscles were separated in the midline bluntly and the peritoneum was entered bluntly. An Alexis retractor was placed to aid in visualization of the uterus.  Attention was turned to the lower uterine segment where a transverse hysterotomy was made with a scalpel and extended bilaterally bluntly. Baby A was successfully delivered, and cord was clamped and cut and infant was handed over to awaiting neonatology team. An amniotomy was then preformed for Baby B.  Due to difficulty in delivering Baby B, the hysterotomy was extended vertically in the midline in a "T" fashion, but maintained in the lower uterine segment.  A breech extraction was then preformed to deliver Baby B.  The cord was clamped and cut and the infant was handed to the awaiting neonatology team.  After cord blood was obtained from both umbilical cords, a plastic cord clamp was placed on the cord for Baby A.  Uterine massage was then administered and the placenta delivered intact with three-vessel cord. The uterus was then cleared of clot and debris.  The "T" part of the incision was closed in three layers using 0  Vicryl - the first in a running locked fashion, the second in an imbricating fashion, and the third closing the serosa.  The hysterotomy was then closed with 0 Vicryl in a running locked  fashion, and an imbricating layer was also placed with a 0 Vicryl. Overall, excellent hemostasis was noted. The abdomen and the pelvis were cleared of all clot and debris and the Ubaldo Glassing was removed. Hemostasis was confirmed on all surfaces.  The peritoneum and rectus muscles were reapproximated using 2-0 vicryl running stitches. The fascia was then closed using 0 Vicryl in a running fashion.  The skin was closed with 4-0 vicryl. The patient tolerated the procedure well. Sponge, lap, instrument and needle counts were correct x 2. She was taken to the recovery room in stable condition.    Truett Mainland, DO 06/05/2014 11:30 PM

## 2014-06-05 NOTE — Anesthesia Procedure Notes (Signed)
Spinal Patient location during procedure: OR Start time: 06/05/2014 10:02 PM End time: 06/05/2014 10:05 PM Staffing Anesthesiologist: Lyn Hollingshead Performed by: anesthesiologist  Preanesthetic Checklist Completed: patient identified, surgical consent, pre-op evaluation, timeout performed, IV checked, risks and benefits discussed and monitors and equipment checked Spinal Block Patient position: sitting Prep: site prepped and draped and DuraPrep Patient monitoring: heart rate, cardiac monitor, continuous pulse ox and blood pressure Approach: midline Location: L3-4 Injection technique: single-shot Needle Needle type: Pencan  Needle gauge: 24 G Needle length: 9 cm Needle insertion depth: 6 cm Assessment Sensory level: T4

## 2014-06-05 NOTE — Progress Notes (Signed)
Patient transferred from short stay area to recovery room to await c-section.

## 2014-06-05 NOTE — H&P (Signed)
Faculty Practice H&P  Sherri [name] is a 29 y.o. female G1P0 with IUP at [redacted]w[redacted]d presenting for primary cesarean section for IUGR and fetal malposition with baby B being transverse, back down lie. Pregnancy was been complicated by Mono/Di twins, IUGR, uterine fibroid, Rh negative status.  She was seen at MFM today for growth [place], which showed a discordant growth of 25% and Baby A having no measurable growth since the last [place] 3 weeks ago and being IUGR with AC less than 3% and overall EFW <10%.  MFM recommended delivery.    Pt states she has been having intermittent contractions, no vaginal bleeding, intact membranes, with normal fetal movement.     Prenatal Course Source of Care: [facility]  with onset of care at 12 weeks Pregnancy complications or risks: Patient Active Problem List   Diagnosis Date Noted  . [redacted] weeks gestation of pregnancy   . [redacted] weeks gestation of pregnancy   . SGA (small for gestational age)   . Twins   . 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 Poor fetal growth affecting management of mother in third trimester, antepartum   . Positive fetal fibronectin at 22 weeks to [redacted] weeks gestation   . Preterm labor in third trimester without delivery   . Decreased amniotic fluid 03/15/2014  . Abdominal pain affecting pregnancy   . Cervical shortening affecting pregnancy in second trimester, antepartum   . Monochorionic diamniotic twin gestation 12/25/2013  . Uterine fibroid 12/25/2013  . Obesity 12/25/2013  . Rh negative status during pregnancy 12/25/2013   She desires to uncertain.  She plans to undecided  Prenatal labs and studies: ABO, Rh: --/--/A NEG (02/19 2308) Antibody: POS (02/19 2308) Rubella:   RPR: NON REAC (02/11 0917)  HBsAg: Negative (10/20 0000)  HIV: NONREACTIVE (02/11 0917)  GBS:    1 hr Glucola 82 Genetic screeningnormal Anatomy US normal  Past Medical History:  Past Medical History  Diagnosis Date  . Uterine fibroid   .  Fibromyalgia   . Hypertension   . Urinary tract infection     Past Surgical History:  Past Surgical History  Procedure Laterality Date  . Wisdom tooth extraction      Obstetrical History:  OB History    Gravida Para Term Preterm AB TAB SAB Ectopic Multiple Living   1         0     Social History:  History   Social History  . Marital Status: Single    Spouse Name: N/A  . Number of Children: N/A  . Years of Education: N/A   Occupational History  . [employer]    Social History Main Topics  . Smoking status: Never Smoker   . Smokeless tobacco: Never Used  . Alcohol Use: 0.0 oz/week    0 Standard drinks or equivalent per week     Comment: occasional when not pregnant  . Drug Use: No  . Sexual Activity: Yes    Birth Control/ Protection: None   Other Topics Concern  . None   Social History Narrative    Family History:  Family History  Problem Relation Age of Onset  . Colon cancer Neg Hx   . Colon polyps Neg Hx   . Kidney disease Neg Hx   . Diabetes Neg Hx   . Esophageal cancer Neg Hx   . Gallbladder disease Neg Hx   . Heart disease Neg Hx     Medications:  Prenatal vitamins,  Current  Facility-Administered Medications  Medication Dose Route Frequency Provider Last Rate Last Dose  . bupivacaine liposome (EXPAREL) 1.3 % injection 266 mg  20 mL Infiltration Once Truett Mainland, DO      . ceFAZolin (ANCEF) IVPB 2 g/50 mL premix  2 g Intravenous On Call to [place], DO      . citric acid-sodium citrate (ORACIT) 334-500 MG/5ML solution           . citric acid-sodium citrate (ORACIT) solution 30 mL  30 mL Oral Once Truett Mainland, DO      . lactated ringers bolus 1,000 mL  1,000 mL Intravenous Once Truett Mainland, DO   1,000 mL at 06/05/14 1918  . lactated ringers infusion   Intravenous Continuous Truett Mainland, DO        Allergies: No Known Allergies  Review of Systems: - Negative  Physical Exam: Blood pressure 135/79, pulse 88, temperature  98.1 F (36.7 C), resp. rate 18, height 4\' 11"  (1.499 m), weight 185 lb (83.915 kg), last menstrual period 10/03/2013, SpO2 99 %. GENERAL: Well-developed, well-nourished female in no acute distress.  LUNGS: Clear to auscultation bilaterally.  HEART: Regular rate and rhythm. ABDOMEN: Soft, nontender, nondistended, gravid. EFW Baby A: 3# 5oz; Baby B: 4#7oz  EXTREMITIES: Nontender, no edema, 2+ distal pulses. Presentation: vertex/transverse, back down   Pertinent Labs/Studies:   Results for orders placed or performed during the hospital encounter of 06/05/14 (from the past 24 hour(s))  CBC     Status: Abnormal   Collection Time: 06/05/14  7:18 PM  Result Value Ref Range   WBC 8.9 4.0 - 10.5 K/uL   RBC 4.06 3.87 - 5.11 MIL/uL   Hemoglobin 11.5 (L) 12.0 - 15.0 g/dL   HCT 35.0 (L) 36.0 - 46.0 %   MCV 86.2 78.0 - 100.0 fL   MCH 28.3 26.0 - 34.0 pg   MCHC 32.9 30.0 - 36.0 g/dL   RDW 17.1 (H) 11.5 - 15.5 %   Platelets 165 150 - 400 K/uL     Assessment : Sherri [name] is a 29 y.o. G1P0 at [redacted]w[redacted]d being admitted for cesarean section secondary to IUGR, fetal mal position  Plan: The risks of cesarean section discussed with the patient included but were not limited to: bleeding which may require transfusion or reoperation; infection which may require antibiotics; injury to bowel, bladder, ureters or other surrounding organs; injury to the fetus; need for additional procedures including hysterectomy in the event of a life-threatening hemorrhage; placental abnormalities wth subsequent pregnancies, incisional problems, thromboembolic phenomenon and other postoperative/anesthesia complications. The patient concurred with the proposed plan, giving informed written consent for the procedure.   Patient has been NPO since 11am and will remain NPO for procedure.  Preoperative prophylactic Ancef ordered on call to the OR.    Truett Mainland, DO 06/05/2014, 8:14 PM

## 2014-06-05 NOTE — Transfer of Care (Signed)
Immediate Anesthesia Transfer of Care Note  Patient: Sherri [name]  Procedure(s) Performed: Procedure(s): CESAREAN SECTION (N/A)  Patient Location: PACU  Anesthesia Type:Spinal  Level of Consciousness: awake, alert  and oriented  Airway & Oxygen Therapy: Patient Spontanous Breathing  Post-op Assessment: Report given to RN and Post -op Vital signs reviewed and stable  Post vital signs: Reviewed and stable  Last Vitals:  Filed Vitals:   06/05/14 2002  BP: 135/79  Pulse: 88  Temp:   Resp:     Complications: No apparent anesthesia complications

## 2014-06-05 NOTE — Anesthesia Preprocedure Evaluation (Signed)
Anesthesia Evaluation  Patient identified by MRN, date of birth, ID band Patient awake    Reviewed: Allergy & Precautions, H&P , NPO status , Patient's Chart, lab work & pertinent test results  Airway Mallampati: II  TM Distance: >3 FB Neck ROM: full    Dental no notable dental hx.    Pulmonary neg pulmonary ROS,    Pulmonary exam normal       Cardiovascular hypertension,     Neuro/Psych negative psych ROS   GI/Hepatic negative GI ROS, Neg liver ROS,   Endo/Other  Morbid obesity  Renal/GU negative Renal ROS     Musculoskeletal   Abdominal (+) + obese,   Peds  Hematology negative hematology ROS (+)   Anesthesia Other Findings   Reproductive/Obstetrics (+) Pregnancy                             Anesthesia Physical Anesthesia Plan  ASA: III  Anesthesia Plan: Spinal   Post-op Pain Management:    Induction:   Airway Management Planned:   Additional Equipment:   Intra-op Plan:   Post-operative Plan:   Informed Consent: I have reviewed the patients History and Physical, chart, labs and discussed the procedure including the risks, benefits and alternatives for the proposed anesthesia with the patient or authorized representative who has indicated his/her understanding and acceptance.     Plan Discussed with: CRNA and Surgeon  Anesthesia Plan Comments:         Anesthesia Quick Evaluation

## 2014-06-06 ENCOUNTER — Encounter (HOSPITAL_COMMUNITY): Payer: Self-pay | Admitting: *Deleted

## 2014-06-06 DIAGNOSIS — Z98891 History of uterine scar from previous surgery: Secondary | ICD-10-CM

## 2014-06-06 LAB — CBC
HCT: 31.2 % — ABNORMAL LOW (ref 36.0–46.0)
Hemoglobin: 10.2 g/dL — ABNORMAL LOW (ref 12.0–15.0)
MCH: 28.2 pg (ref 26.0–34.0)
MCHC: 32.7 g/dL (ref 30.0–36.0)
MCV: 86.2 fL (ref 78.0–100.0)
Platelets: 161 10*3/uL (ref 150–400)
RBC: 3.62 MIL/uL — ABNORMAL LOW (ref 3.87–5.11)
RDW: 16.9 % — ABNORMAL HIGH (ref 11.5–15.5)
WBC: 13.3 10*3/uL — ABNORMAL HIGH (ref 4.0–10.5)

## 2014-06-06 LAB — KLEIHAUER-BETKE STAIN
# Vials RhIg: 1
Fetal Cells %: 0.2 %
Quantitation Fetal Hemoglobin: 10 mL

## 2014-06-06 LAB — RPR: RPR Ser Ql: NONREACTIVE

## 2014-06-06 MED ORDER — IBUPROFEN 600 MG PO TABS
600.0000 mg | ORAL_TABLET | Freq: Four times a day (QID) | ORAL | Status: DC
  Administered 2014-06-06 – 2014-06-08 (×10): 600 mg via ORAL
  Filled 2014-06-06 (×10): qty 1

## 2014-06-06 MED ORDER — RHO D IMMUNE GLOBULIN 1500 UNIT/2ML IJ SOSY
300.0000 ug | PREFILLED_SYRINGE | Freq: Once | INTRAMUSCULAR | Status: AC
  Administered 2014-06-06: 300 ug via INTRAVENOUS
  Filled 2014-06-06: qty 2

## 2014-06-06 MED ORDER — PROMETHAZINE HCL 25 MG/ML IJ SOLN
INTRAMUSCULAR | Status: AC
  Administered 2014-06-06: 12.5 mg via INTRAVENOUS
  Filled 2014-06-06: qty 1

## 2014-06-06 MED ORDER — DIPHENHYDRAMINE HCL 50 MG/ML IJ SOLN: 12.5000 mg | INTRAMUSCULAR | Status: DC | PRN

## 2014-06-06 MED ORDER — SODIUM CHLORIDE 0.9 % IJ SOLN: 3.0000 mL | INTRAMUSCULAR | Status: DC | PRN

## 2014-06-06 MED ORDER — SENNOSIDES-DOCUSATE SODIUM 8.6-50 MG PO TABS
2.0000 | ORAL_TABLET | ORAL | Status: DC
  Administered 2014-06-06 – 2014-06-07 (×2): 2 via ORAL
  Filled 2014-06-06 (×2): qty 2

## 2014-06-06 MED ORDER — OXYCODONE-ACETAMINOPHEN 5-325 MG PO TABS
2.0000 | ORAL_TABLET | ORAL | Status: DC | PRN
  Administered 2014-06-06 – 2014-06-08 (×6): 2 via ORAL
  Filled 2014-06-06 (×6): qty 2

## 2014-06-06 MED ORDER — ZOLPIDEM TARTRATE 5 MG PO TABS: 5.0000 mg | ORAL_TABLET | Freq: Every evening | ORAL | Status: DC | PRN

## 2014-06-06 MED ORDER — MENTHOL 3 MG MT LOZG: 1.0000 | LOZENGE | OROMUCOSAL | Status: DC | PRN

## 2014-06-06 MED ORDER — NALBUPHINE HCL 10 MG/ML IJ SOLN: 5.0000 mg | Freq: Once | INTRAMUSCULAR | Status: AC | PRN

## 2014-06-06 MED ORDER — WITCH HAZEL-GLYCERIN EX PADS: 1.0000 "application " | MEDICATED_PAD | CUTANEOUS | Status: DC | PRN

## 2014-06-06 MED ORDER — SIMETHICONE 80 MG PO CHEW
80.0000 mg | CHEWABLE_TABLET | ORAL | Status: DC
  Administered 2014-06-06 – 2014-06-07 (×2): 80 mg via ORAL
  Filled 2014-06-06 (×2): qty 1

## 2014-06-06 MED ORDER — NALBUPHINE HCL 10 MG/ML IJ SOLN: 5.0000 mg | INTRAMUSCULAR | Status: DC | PRN

## 2014-06-06 MED ORDER — OXYCODONE-ACETAMINOPHEN 5-325 MG PO TABS
1.0000 | ORAL_TABLET | ORAL | Status: DC | PRN
  Administered 2014-06-06: 1 via ORAL
  Filled 2014-06-06: qty 1

## 2014-06-06 MED ORDER — DIPHENHYDRAMINE HCL 25 MG PO CAPS
25.0000 mg | ORAL_CAPSULE | ORAL | Status: DC | PRN
Start: 2014-06-06 — End: 2014-06-08

## 2014-06-06 MED ORDER — NALOXONE HCL 0.4 MG/ML IJ SOLN: 0.4000 mg | INTRAMUSCULAR | Status: DC | PRN

## 2014-06-06 MED ORDER — OXYTOCIN 40 UNITS IN LACTATED RINGERS INFUSION - SIMPLE MED
62.5000 mL/h | INTRAVENOUS | Status: AC
Start: 2014-06-06 — End: 2014-06-06

## 2014-06-06 MED ORDER — LANOLIN HYDROUS EX OINT: 1.0000 "application " | TOPICAL_OINTMENT | CUTANEOUS | Status: DC | PRN

## 2014-06-06 MED ORDER — LACTATED RINGERS IV SOLN: INTRAVENOUS | Status: DC

## 2014-06-06 MED ORDER — DIBUCAINE 1 % RE OINT: 1.0000 "application " | TOPICAL_OINTMENT | RECTAL | Status: DC | PRN

## 2014-06-06 MED ORDER — HYDROMORPHONE HCL 1 MG/ML IJ SOLN
INTRAMUSCULAR | Status: AC
  Administered 2014-06-06: 0.5 mg via INTRAVENOUS
  Filled 2014-06-06: qty 1

## 2014-06-06 MED ORDER — KETOROLAC TROMETHAMINE 30 MG/ML IJ SOLN
INTRAMUSCULAR | Status: AC
  Filled 2014-06-06: qty 1

## 2014-06-06 MED ORDER — SCOPOLAMINE 1 MG/3DAYS TD PT72
1.0000 | MEDICATED_PATCH | Freq: Once | TRANSDERMAL | Status: DC
  Filled 2014-06-06: qty 1

## 2014-06-06 MED ORDER — NALOXONE HCL 1 MG/ML IJ SOLN
1.0000 ug/kg/h | INTRAVENOUS | Status: DC | PRN
  Filled 2014-06-06: qty 2

## 2014-06-06 MED ORDER — ONDANSETRON HCL 4 MG/2ML IJ SOLN: 4.0000 mg | Freq: Three times a day (TID) | INTRAMUSCULAR | Status: DC | PRN

## 2014-06-06 MED ORDER — SIMETHICONE 80 MG PO CHEW: 80.0000 mg | CHEWABLE_TABLET | ORAL | Status: DC | PRN

## 2014-06-06 MED ORDER — SIMETHICONE 80 MG PO CHEW
80.0000 mg | CHEWABLE_TABLET | Freq: Three times a day (TID) | ORAL | Status: DC
  Administered 2014-06-06 – 2014-06-08 (×8): 80 mg via ORAL
  Filled 2014-06-06 (×8): qty 1

## 2014-06-06 MED ORDER — TETANUS-DIPHTH-ACELL PERTUSSIS 5-2.5-18.5 LF-MCG/0.5 IM SUSP: 0.5000 mL | Freq: Once | INTRAMUSCULAR | Status: DC

## 2014-06-06 MED ORDER — PRENATAL MULTIVITAMIN CH
1.0000 | ORAL_TABLET | Freq: Every day | ORAL | Status: DC
  Administered 2014-06-06 – 2014-06-08 (×3): 1 via ORAL
  Filled 2014-06-06 (×3): qty 1

## 2014-06-06 MED ORDER — ACETAMINOPHEN 325 MG PO TABS: 650.0000 mg | ORAL_TABLET | ORAL | Status: DC | PRN

## 2014-06-06 MED ORDER — IBUPROFEN 600 MG PO TABS: 600.0000 mg | ORAL_TABLET | Freq: Four times a day (QID) | ORAL | Status: DC | PRN

## 2014-06-06 MED ORDER — DIPHENHYDRAMINE HCL 25 MG PO CAPS: 25.0000 mg | ORAL_CAPSULE | Freq: Four times a day (QID) | ORAL | Status: DC | PRN

## 2014-06-06 NOTE — Progress Notes (Signed)
Clinical Social Work Department PSYCHOSOCIAL ASSESSMENT - MATERNAL/CHILD 06/06/2014  Patient:  Sherri [name], Sherri [name]  Account Number:  [id number]  Admit Date:  06/05/2014  Ardine Eng Name:   A: Darrick Meigs  B: Weldon Picking    Clinical Social Worker:  Terri Piedra, LCSW   Date/Time:  06/06/2014 03:30 PM  Date Referred:        Other referral source:   No referral-NICU admission  CSW familiar with MOB from Antenatal admission in 2/16    I:  FAMILY / [place] legal guardian:  PARENT  Guardian - Name Guardian - Age Guardian - Address  Sherri [name] 28 [address], [place], [place] [address]  Sherri [name]  same   Other household support members/support persons Other support:   Parents report having a good support system of family and friends.  MGM lives in [place] and Pennington lives 2 hours away in [place].    II  PSYCHOSOCIAL DATA Information Source:  Family Interview  Occupational hygienist Employment:   FOB works at [employer] and [employer].  MOB worked at [employer] before being put on bed rest.  She and FOB and weighing options and she may or may not return to work.   Financial resources:  Medicaid If Medicaid - County:  [place] Other  [place] / Grade:   Maternity Care Coordinator / Child Services Coordination / Early Interventions:  Cultural issues impacting care:   None stated    III  STRENGTHS Strengths  Adequate Resources  Compliance with medical plan  Home prepared for Child (including basic supplies)  Other - See comment  Supportive family/friends  Understanding of illness   Strength comment:  Parents will obtain a pediatrician list from the NICU RN station and understand that they must choose a provider prior to babies' discharges.   IV  RISK FACTORS AND CURRENT PROBLEMS Current Problem:  None   Risk Factor & Current Problem Patient Issue Family Issue Risk Factor / Current Problem Comment   N N     V   SOCIAL WORK ASSESSMENT  CSW met with parents in MOB's third floor room/304 to introduce myself, offer support and complete assessment due to babies' admission to NICU at 35.5 weeks.  Parents were pleasant and welcomed CSW's intervention.  CSW explained role and ongoing support services offered by NICU CSW.  MOB was quiet, and FOB did the majority of the talking.  It appears that they have a good understanding of their babies' current medical needs, but asked for a review of what they needed to accomplish before being able to go home.  FOB, especially, states his excitement about the babies and eagerness to take them home.  CSW spoke in general terms about the basic milestones that must be met such as weight gain, intake completely by mouth, maintaining temperature, etc.  CSW encouraged parents to enjoy their baby's first few days and weeks regardless of the setting, acknowledging that there are emotional stressors to a NICU admission.  CSW spoke about common emotions related to the NICU experience and encouraged parents to allow themselves to be emotional.  CSW provided education on PPD signs and symptoms and encouraged them to talk with a professional if symptoms arise.  CSW asked that parents not have expectations regarding babies' discharge dates, while continuing to prepare at home, rather than setting possibly unrealistic expectations on the babies that may not be met.  CSW explained that often parents are letdown  if the baby does not meet their expectations for discharge.  Parents stated understanding.  FOB reports having all necessary baby supplies except for the cribs at this time.  He states he has the means to get the cribs, but his car is being fixed at this time.  He states he should be getting his car back today or tomorrow and then he can pick up the cribs.  CSW talked about safe sleep and both parents were easily engaged and attentive.  They stated no questions.  They appear to be supportive of each  other, coping well at this time and state no current emotional concerns.  CSW provided contact information and asked that they call any time.  Parents thanked CSW and seemed genuinely appreciative of the visit.  CSW is not aware of any social concerns at this time.   VI SOCIAL WORK PLAN Social Work Plan  Psychosocial Support/Ongoing Assessment of Needs  Patient/Family Education   Type of pt/family education:   Ongoing support services offered by NICU CSW  PPD signs and symptoms  What to expect from a NICU admission/milestones to accomplish prior to discharge (in general terms)   If child protective services report - county:   If child protective services report - date:   Information/referral to community resources comment:   No referral needs noted at this time.   Other social work plan:

## 2014-06-06 NOTE — Lactation Note (Signed)
This note was copied from the chart of [facility]. Lactation Consultation Note  Patient Name: Sherri [name] EAVWU'J Date: 06/06/2014 Reason for consult: Initial assessment   With this mom of twins, now 66 hour old. Mom was started pumping this morning. I explained the importanec of pumping every 3 hours, and how and why to add hand expression also. I reviewed th NICU booklet with mom, and faxed information to [facility] for mom to get DEP. Mom knows to call for questions/concerns   Maternal Data Formula Feeding for Exclusion: Yes (babies are in NICU) Has patient been taught Hand Expression?: Yes Does the patient have breastfeeding experience prior to this delivery?: No  Feeding Feeding Type: Formula Length of feed: 20 min  LATCH Score/Interventions                      Lactation Tools Discussed/Used WIC Program: Yes (in faxed to [facility] in [place]) Pump Review: Setup, frequency, and cleaning;Milk Storage;Other (comment) (premie setting, hand expression) Initiated by:: bedside nurse Date initiated:: 06/06/14   Consult Status Consult Status: Follow-up Date: 06/07/14 Follow-up type: In-patient    Tonna Corner 06/06/2014, 6:44 PM

## 2014-06-06 NOTE — Addendum Note (Signed)
Addendum  created 06/06/14 0748 by Tobin Chad, CRNA   Modules edited: Notes Section   Notes Section:  File: [id number]

## 2014-06-06 NOTE — Progress Notes (Signed)
Ur chart review completed.  

## 2014-06-06 NOTE — Anesthesia Postprocedure Evaluation (Signed)
  Anesthesia Post-op Note  Patient: Terrilynn A Guterrez  Procedure(s) Performed: Procedure(s): CESAREAN SECTION (N/A)  Patient Location: Women[facility] Unit  Anesthesia Type:Spinal  Level of Consciousness: awake, oriented and patient cooperative  Airway and Oxygen Therapy: Patient Spontanous Breathing  Post-op Pain: none  Post-op Assessment: Post-op Vital signs reviewed, Patient's Cardiovascular Status Stable, Respiratory Function Stable, Patent Airway, No signs of Nausea or vomiting, Adequate PO intake, Pain level controlled, No headache, No backache, No residual numbness and No residual motor weakness  Post-op Vital Signs: Reviewed and stable  Last Vitals:  Filed Vitals:   06/06/14 0642  BP: 103/77  Pulse: 98  Temp: 37.4 C  Resp: 20    Complications: No apparent anesthesia complications

## 2014-06-06 NOTE — Anesthesia Postprocedure Evaluation (Signed)
Anesthesia Post Note  Patient: Sherri [name]  Procedure(s) Performed: Procedure(s) (LRB): CESAREAN SECTION (N/A)  Anesthesia type: Spinal  Patient location: PACU  Post pain: Pain level controlled  Post assessment: Post-op Vital signs reviewed  Last Vitals:  Filed Vitals:   06/06/14 0030  BP: 121/103  Pulse: 80  Temp:   Resp: 29    Post vital signs: Reviewed  Level of consciousness: awake  Complications: No apparent anesthesia complications

## 2014-06-06 NOTE — Progress Notes (Signed)
Subjective: Postpartum Day 1: Cesarean Delivery Patient reports incisional pain.  No nausea, vomiting.  Pain controlled.  No SOB, CP, fevers, chills  Objective: Vital signs in last 24 hours: Temp:  [97.9 F (36.6 C)-99.7 F (37.6 C)] 99.4 F (37.4 C) (04/06 4259) Pulse Rate:  [73-113] 98 (04/06 0642) Resp:  [16-30] 20 (04/06 0642) BP: (103-142)/(58-103) 103/77 mmHg (04/06 0642) SpO2:  [94 %-100 %] 94 % (04/06 0642) Weight:  [185 lb (83.915 kg)] 185 lb (83.915 kg) (04/05 1908)  Physical Exam:  General: alert, cooperative and no distress Lochia: appropriate Uterine Fundus: firm Incision: no significant drainage DVT Evaluation: No evidence of DVT seen on physical exam. Negative Homan's sign. No cords or calf tenderness.   Recent Labs  06/05/14 1918 06/06/14 0521  HGB 11.5* 10.2*  HCT 35.0* 31.2*    Assessment/Plan: Status post Cesarean section. Doing well postoperatively.  Continue current care. Pain control D/c foley, IVF.  Sherri [name] JEHIEL 06/06/2014, 7:40 AM

## 2014-06-07 ENCOUNTER — Encounter: Payer: Medicaid Other | Admitting: Obstetrics & Gynecology

## 2014-06-07 ENCOUNTER — Encounter (HOSPITAL_COMMUNITY): Payer: Self-pay | Admitting: Family Medicine

## 2014-06-07 LAB — RH IG WORKUP (INCLUDES ABO/RH)
ABO/RH(D): A NEG
Fetal Screen: POSITIVE
Gestational Age(Wks): 35
Unit division: 0

## 2014-06-07 NOTE — Lactation Note (Signed)
This note was copied from the chart of [facility]. Lactation Consultation Note  Follow up assessment done.  Mom requested pump review which I did.  Instructed to pump every 3 hours for 15 minutes followed by hand expression.  Reassured mom that milk will come to volume in 3-5 days.  Mom has talked to [facility] about pump.  Encouraged to call for concerns/assist.  Patient Name: Sherri [name] [facility] [id number] Date: 06/07/2014     Maternal Data    Feeding Feeding Type: Formula Length of feed: 30 min  LATCH Score/Interventions                      Lactation Tools Discussed/Used     Consult Status      Ave Filter 06/07/2014, 9:54 AM

## 2014-06-07 NOTE — Progress Notes (Signed)
CSW called by babies' bedside RN stating that MOB has questions.  CSW met with MOB who requests a pediatrician list and daycare assistance.  CSW provided pediatrician list and advised that she call each provider to see who is accepting new Medicaid patients.  CSW asked that she do this soon as the NICU team needs to know prior to babies' discharges.  CSW advised that she go to [facility] to inquire about daycare assistance.  CSW asked if FOB has gotten his car back, as yesterday they reported that it is in the shop.  She states he has not.  CSW asked if they would use bus passes and she said yes.  CSW provided her with 12 passes (6 round trips) for her and FOB, for which she was very grateful.  CSW asked that she call if she needs anything.  She smiled and thanked CSW.

## 2014-06-07 NOTE — Progress Notes (Signed)
Subjective: Postpartum Day 2: Cesarean Delivery  For PROM twins with IUGR, but 4lb 2, 4lb 6oz infants that are being fed by NG tube at present. Pt is attempting to pump for breast milk, so far minimal production. Patient reports incisional pain, tolerating PO, + flatus and no problems voiding.  She is very anxious about discomfort, but has ambulated in hallway prn  Objective: Vital signs in last 24 hours: Temp:  [98 F (36.7 C)-99.2 F (37.3 C)] 98 F (36.7 C) (04/06 2138) Pulse Rate:  [98-101] 98 (04/06 2138) Resp:  [18-20] 18 (04/06 2138) BP: (131-135)/(69-93) 132/93 mmHg (04/06 2138) SpO2:  [97 %-100 %] 100 % (04/06 2138)  Physical Exam:  General: alert, cooperative, no distress and anxious Lochia: appropriate Uterine Fundus: firm Incision: healing well, no significant drainage, dressing dry,  steristrips are slightly reddened,with old drainage, no fresh blood DVT Evaluation: No evidence of DVT seen on physical exam.   Recent Labs  06/05/14 1918 06/06/14 0521  HGB 11.5* 10.2*  HCT 35.0* 31.2*    Assessment/Plan: Status post Cesarean section. Doing well postoperatively.  Continue current care as inpt til tomorrow Pt will need to be seen by lactation again today  BC Progesterone only pills while breast feeding regular ocp's thereafter.  Adyn Hoes V 06/07/2014, 7:02 AM

## 2014-06-08 MED ORDER — NORETHINDRONE 0.35 MG PO TABS: 1.0000 | ORAL_TABLET | Freq: Every day | ORAL | Status: DC

## 2014-06-08 MED ORDER — OXYCODONE-ACETAMINOPHEN 5-325 MG PO TABS: 1.0000 | ORAL_TABLET | ORAL | Status: DC | PRN

## 2014-06-08 MED ORDER — IBUPROFEN 600 MG PO TABS: 600.0000 mg | ORAL_TABLET | Freq: Four times a day (QID) | ORAL | Status: DC | PRN

## 2014-06-08 NOTE — Progress Notes (Signed)
Pt discharged home via cab, stating the father of the babies car is in shop and would not be able to pick her up.Marland Kitchen Discharge instructions reviewed with pt and she verbalized understanding... Condition stable... Pt wanted to visit babies in NICU and will discharge home from there.

## 2014-06-08 NOTE — Discharge Summary (Signed)
Obstetric Discharge Summary Reason for Admission: cesarean section Prenatal Procedures: ultrasound Intrapartum Procedures: cesarean: low cervical, transverse Postpartum Procedures: none Complications-Operative and Postpartum: none HEMOGLOBIN  Date Value Ref Range Status  06/06/2014 10.2* 12.0 - 15.0 g/dL Final   HCT  Date Value Ref Range Status  06/06/2014 31.2* 36.0 - 46.0 % Final   Pt was admitted for c-section delivery of IUGR of mono/di twin gestation.  She had an uncomplicated delivery.  She is tolerating a regular diet.  She is ambulating and voiding without difficulty. She has passed flatus but, has not has a BM.  Sh efeels ready to go home and has no contraindications to discharge. Sh e is using the breastpump and desires OCP's for contraception.  Physical Exam:  General: alert and no distress Lochia: appropriate Uterine Fundus: firm Incision: honeycomb dressing in place; old blood noted DVT Evaluation: No evidence of DVT seen on physical exam.  CBC    Component Value Date/Time   WBC 13.3* 06/06/2014 0521   RBC 3.62* 06/06/2014 0521   HGB 10.2* 06/06/2014 0521   HCT 31.2* 06/06/2014 0521   PLT 161 06/06/2014 0521   MCV 86.2 06/06/2014 0521   MCH 28.2 06/06/2014 0521   MCHC 32.7 06/06/2014 0521   RDW 16.9* 06/06/2014 0521   LYMPHSABS 2.0 12/10/2013 0604   MONOABS 0.4 12/10/2013 0604   EOSABS 0.1 12/10/2013 0604   BASOSABS 0.0 12/10/2013 0604     Discharge Diagnoses: Preterm twin pregnancy delivered due to IUGR  Discharge Information: Date: 06/08/2014 Activity: pelvic rest Diet: routine Medications: Ibuprofen and Percocet Condition: stable Instructions: refer to practice specific booklet Discharge to: home Follow-up Information    Follow up with [facility] In 4 weeks.   Contact information:   [address] [place] [address] [phone]      Newborn Data:   Buena, Boehm [[id number]]  Live born female   Birth Weight: 4 lb 2 oz (1870 g) APGAR: 7, 8   Gracen, Ringwald [[id number]]  Live born female  Birth Weight: 4 lb 5.8 oz (1980 g) APGAR: 8, 9  Both infants to stay in NICU [facility], Yaneliz Radebaugh 06/08/2014, 9:02 AM

## 2014-06-08 NOTE — Discharge Instructions (Signed)

## 2014-06-08 NOTE — Lactation Note (Signed)
This note was copied from the chart of [facility]. Lactation Consultation Note  Patient Name: Sherri [name] HUDJS'H Date: 06/08/2014 Reason for consult: NICU baby;Infant < 6lbs  Mom is being D/C today. Per mom has called WIC and WIC was unable to give her Pump Apt today  And suggested obtaining a WIC Loaner from the hospital before D/C. LC discussed and reviewed the importance of consistent pumping every 2 -3 hours to establish and protect  Milk supply for her Twins. Per mom is getting much as of yet . LC reassured mom it can be a slow process, and being consistent  With the pumping is the key. Along with hand expressing before or after pumping. Also when visiting baby ask NICU RN to assist  With Skin to skin and then plan on pumping after wards in the NICU pumping room.  Sore nipple and engorgement prevention and tx reviewed with mom. [facility] loaner pump request was faxed 2 days ago by Rondell Reams, RN , IBCLC  Mom obtained [facility] from this [facility]. Reviewed set up , and reminded mom to take tubes and membranes. Mother informed of post-discharge support and given phone number to the lactation department, including services for phone call assistance;  out-patient appointments; and breastfeeding support group. List of other breastfeeding resources in the community given in the handout. Encouraged  mother to call for problems or concerns related to breastfeeding.     Maternal Data Has patient been taught Hand Expression?: Yes  Feeding Feeding Type: Formula Length of feed: 30 min  LATCH Score/Interventions                      Lactation Tools Discussed/Used WIC Program: Yes   Consult Status Consult Status: PRN Follow-up type: Other (comment) (in NICU while Twins are patients )    Myer Haff 06/08/2014, 12:15 PM

## 2014-06-09 LAB — TYPE AND SCREEN
ABO/RH(D): A NEG
Antibody Screen: POSITIVE
DAT, IgG: NEGATIVE
Unit division: 0
Unit division: 0

## 2014-06-11 ENCOUNTER — Ambulatory Visit: Payer: Self-pay

## 2014-06-11 NOTE — Lactation Note (Signed)
This note was copied from the chart of [facility]. Lactation Consultation Note  Patient Name: Giannie Soliday EUMPN'T Date: 06/11/2014 Reason for consult: Follow-up assessment;NICU baby NICU twins, 6 days of life. Assisted mom to latch babies to breast simultaneously. Neither baby latched, but both suckled and licked at breasts. Both babies tolerated their time at the breast well, and mom was very happy to have babies STS. Mom's milk has come in and she has a good supply. Enc mom to keep pumping at least 8 times a day. Mom pleased to attempt to nurse. Enc mom to call for assistance with latching again tomorrow if she is able. Discussed the benefits of EBM, and of having babies at breast for both the babies and mom and for keeping a good milk supply. Enc mom to call for [facility] as needed.   Maternal Data    Feeding Feeding Type: Breast Milk Length of feed: 60 min  LATCH Score/Interventions Latch: Too sleepy or reluctant, no latch achieved, no sucking elicited. Intervention(s): Skin to skin;Teach feeding cues  Audible Swallowing: None Intervention(s): Skin to skin;Hand expression  Type of Nipple: Everted at rest and after stimulation  Comfort (Breast/Nipple): Soft / non-tender     Hold (Positioning): Assistance needed to correctly position infant at breast and maintain latch.  LATCH Score: 5  Lactation Tools Discussed/Used     Consult Status Consult Status: Follow-up    Inocente Salles 06/11/2014, 4:31 PM

## 2014-06-12 ENCOUNTER — Ambulatory Visit (HOSPITAL_COMMUNITY): Payer: Medicaid Other

## 2014-06-12 ENCOUNTER — Telehealth: Payer: Self-pay | Admitting: General Practice

## 2014-06-12 NOTE — Telephone Encounter (Signed)
Patient called and left message stating she wants a note for work for her unemployment stating she can work and has no restrictions. Called patient stating she just had a c-section a week ago today so we would be unable to provide her a note. Patient states no not for now, before I had the baby starting in February. Told patient we never took her out of work. Patient states she received a restriction letter stating she couldn't lift. Told patient that's correct, every pregnant woman would have that restriction. Patient states I just need a note stating I could have worked during that time. Told patient I will call her back shortly. Called patient back and asked if the letter we gave her in October would be sufficient as that is the only letter we provided her with during her pregnancy and it does not state she is unable to work, it just lists a couple restrictions. Patient verbalized understanding and states that she be fine and states she will come by tomorrow to pick it up. Patient had no other questions

## 2014-06-21 ENCOUNTER — Encounter: Payer: Self-pay | Admitting: *Deleted

## 2014-06-27 ENCOUNTER — Telehealth: Payer: Self-pay | Admitting: *Deleted

## 2014-06-27 NOTE — Telephone Encounter (Signed)
Pt contacted the clinic reporting pain when pumping milk with breast pump.  Contacted patient, patient describes pain in both breast when pumping.  Phone to lactation services given.  Pt verbalizes understanding.

## 2014-07-04 ENCOUNTER — Other Ambulatory Visit: Payer: Self-pay | Admitting: Obstetrics & Gynecology

## 2014-07-13 ENCOUNTER — Ambulatory Visit: Payer: Medicaid Other

## 2014-07-13 VITALS — Ht 61.0 in | Wt 158.3 lb

## 2014-07-13 DIAGNOSIS — Z5189 Encounter for other specified aftercare: Secondary | ICD-10-CM

## 2014-07-13 NOTE — Progress Notes (Signed)
Pt came in for wound check today.  Pt delivered via c-section on 06/04/14.  Pt c/o odor and some drainage and pain at incision site.  Looking at patients wound pt had old steri strips placed with old drainage that had odor.  After taking steri strips off and cleansed area with normal saline, pt had two places that were not healed so place steri strip just on that area.  Incision well approimated, no drainage, pt c/o pain @ incision site treating with Ibuprofen.  Per Dr. Deniece Ree, pt incision looks great, pain will take some time due to healing.  I advised pt that her incision will be evaluated again during her PP visit.  I informed her of signs of infection.  Pt stated understanding.

## 2014-07-18 ENCOUNTER — Other Ambulatory Visit: Payer: Self-pay | Admitting: Obstetrics and Gynecology

## 2014-07-18 ENCOUNTER — Encounter: Payer: Self-pay | Admitting: Obstetrics and Gynecology

## 2014-07-18 ENCOUNTER — Other Ambulatory Visit (HOSPITAL_COMMUNITY)
Admission: RE | Admit: 2014-07-18 | Discharge: 2014-07-18 | Disposition: A | Discharge status: Home / Self Care | Payer: Medicaid Other | Source: Ambulatory Visit | Attending: Obstetrics and Gynecology | Admitting: Obstetrics and Gynecology

## 2014-07-18 ENCOUNTER — Ambulatory Visit (INDEPENDENT_AMBULATORY_CARE_PROVIDER_SITE_OTHER): Payer: Medicaid Other | Admitting: Obstetrics and Gynecology

## 2014-07-18 DIAGNOSIS — Z1151 Encounter for screening for human papillomavirus (HPV): Secondary | ICD-10-CM | POA: Diagnosis not present

## 2014-07-18 DIAGNOSIS — Z124 Encounter for screening for malignant neoplasm of cervix: Secondary | ICD-10-CM

## 2014-07-18 DIAGNOSIS — Z01419 Encounter for gynecological examination (general) (routine) without abnormal findings: Secondary | ICD-10-CM | POA: Diagnosis present

## 2014-07-18 MED ORDER — NORGESTIMATE-ETH ESTRADIOL 0.25-35 MG-MCG PO TABS: 1.0000 | ORAL_TABLET | Freq: Every day | ORAL | Status: DC

## 2014-07-18 MED ORDER — IBUPROFEN 600 MG PO TABS: 600.0000 mg | ORAL_TABLET | Freq: Four times a day (QID) | ORAL | Status: DC | PRN

## 2014-07-18 NOTE — Progress Notes (Deleted)
Patient ID: Sherri [name], female   DOB: [birth year], 29 y.o.   MRN: [id number] Subjective:     Sherri [name] is a 29 y.o. female who presents for a postpartum visit. She is 6 weeks postpartum following a low cervical transverse Cesarean section. I have fully reviewed the prenatal and intrapartum course. The delivery was at 35.5 gestational weeks. Outcome: primary cesarean section, low transverse incision. Anesthesia: spinal. Postpartum course has been ***. Baby's course has been ***. Baby is feeding by breast. Bleeding no bleeding. Bowel function is slow, still having problems with constipation. Bladder function is normal. Patient is not sexually active. Contraception method is OCPs. Postpartum depression screening: negative.  {Common ambulatory SmartLinks:19316}  Review of Systems {ros; complete:30496}   Objective:    There were no vitals taken for this visit.  General:  {gen appearance:16600}   Breasts:  {breast exam:1202::"inspection negative, no nipple discharge or bleeding, no masses or nodularity palpable"}  Lungs: {lung exam:16931}  Heart:  {heart exam:5510}  Abdomen: {abdomen exam:16834}   Vulva:  {labia exam:12198}  Vagina: {vagina exam:12200}  Cervix:  {cervix exam:14595}  Corpus: {uterus exam:12215}  Adnexa:  {adnexa exam:12223}  Rectal Exam: {rectal/vaginal exam:12274}        Assessment:    *** postpartum exam. Pap smear {done:10129} at today's visit.   Plan:    1. Contraception: {method:5051} 2. *** 3. Follow up in: {1-10:13787} {time; units:19136} or as needed.

## 2014-07-18 NOTE — Patient Instructions (Signed)
Ethinyl Estradiol; Norgestimate tablets What is this medicine? ETHINYL ESTRADIOL; NORGESTIMATE (ETH in il es tra DYE ole; nor JES ti mate) is an oral contraceptive. The products combine two types of female hormones, an estrogen and a progestin. They are used to prevent ovulation and pregnancy. Some products are also used to treat acne in females. This medicine may be used for other purposes; ask your health care provider or pharmacist if you have questions. COMMON BRAND NAME(S): Estarylla, MONO-LINYAH, MonoNessa, Ortho Tri-Cyclen, Ortho Tri-Cyclen Lo, Ortho-Cyclen, Previfem, Sprintec, Tri-Estarylla, TRI-LINYAH, Tri-Lo-Sprintec, Tri-Previfem, Tri-Sprintec, Bertram Millard What should I tell my health care provider before I take this medicine? They need to know if you have or ever had any of these conditions: -abnormal vaginal bleeding -blood vessel disease or blood clots -breast, cervical, endometrial, ovarian, liver, or uterine cancer -diabetes -gallbladder disease -heart disease or recent heart attack -high blood pressure -high cholesterol -kidney disease -liver disease -migraine headaches -stroke -systemic lupus erythematosus (SLE) -tobacco smoker -an unusual or allergic reaction to estrogens, progestins, other medicines, foods, dyes, or preservatives -pregnant or trying to get pregnant -breast-feeding How should I use this medicine? Take this medicine by mouth. To reduce nausea, this medicine may be taken with food. Follow the directions on the prescription label. Take this medicine at the same time each day and in the order directed on the package. Do not take your medicine more often than directed. Contact your pediatrician regarding the use of this medicine in children. Special care may be needed. This medicine has been used in female children who have started having menstrual periods. A patient package insert for the product will be given with each prescription and refill. Read this sheet  carefully each time. The sheet may change frequently. Overdosage: If you think you have taken too much of this medicine contact a poison control center or emergency room at once. NOTE: This medicine is only for you. Do not share this medicine with others. What if I miss a dose? If you miss a dose, refer to the patient information sheet you received with your medicine for direction. If you miss more than one pill, this medicine may not be as effective and you may need to use another form of birth control. What may interact with this medicine? -acetaminophen -antibiotics or medicines for infections, especially rifampin, rifabutin, rifapentine, and griseofulvin, and possibly penicillins or tetracyclines -aprepitant -ascorbic acid (vitamin C) -atorvastatin -barbiturate medicines, such as phenobarbital -bosentan -carbamazepine -caffeine -clofibrate -cyclosporine -dantrolene -doxercalciferol -felbamate -grapefruit juice -hydrocortisone -medicines for anxiety or sleeping problems, such as diazepam or temazepam -medicines for diabetes, including pioglitazone -mineral oil -modafinil -mycophenolate -nefazodone -oxcarbazepine -phenytoin -prednisolone -ritonavir or other medicines for HIV infection or AIDS -rosuvastatin -selegiline -soy isoflavones supplements -St. John's wort -tamoxifen or raloxifene -theophylline -thyroid hormones -topiramate -warfarin This list may not describe all possible interactions. Give your health care provider a list of all the medicines, herbs, non-prescription drugs, or dietary supplements you use. Also tell them if you smoke, drink alcohol, or use illegal drugs. Some items may interact with your medicine. What should I watch for while using this medicine? Visit your doctor or health care professional for regular checks on your progress. You will need a regular breast and pelvic exam and Pap smear while on this medicine. You should also discuss the need  for regular mammograms with your health care professional, and follow his or her guidelines for these tests. This medicine can make your body retain fluid, making your fingers, hands, or ankles  swell. Your blood pressure can go up. Contact your doctor or health care professional if you feel you are retaining fluid. Use an additional method of contraception during the first cycle that you take these tablets. If you have any reason to think you are pregnant, stop taking this medicine right away and contact your doctor or health care professional. If you are taking this medicine for hormone related problems, it may take several cycles of use to see improvement in your condition. Smoking increases the risk of getting a blood clot or having a stroke while you are taking birth control pills, especially if you are more than 28 years old. You are strongly advised not to smoke. This medicine can make you more sensitive to the sun. Keep out of the sun. If you cannot avoid being in the sun, wear protective clothing and use sunscreen. Do not use sun lamps or tanning beds/booths. If you wear contact lenses and notice visual changes, or if the lenses begin to feel uncomfortable, consult your eye care specialist. In some women, tenderness, swelling, or minor bleeding of the gums may occur. Notify your dentist if this happens. Brushing and flossing your teeth regularly may help limit this. See your dentist regularly and inform your dentist of the medicines you are taking. If you are going to have elective surgery, you may need to stop taking this medicine before the surgery. Consult your health care professional for advice. This medicine does not protect you against HIV infection (AIDS) or any other sexually transmitted diseases. What side effects may I notice from receiving this medicine? Side effects that you should report to your doctor or health care professional as soon as possible: -breast tissue changes or  discharge -changes in vaginal bleeding during your period or between your periods -chest pain -coughing up blood -dizziness or fainting spells -headaches or migraines -leg, arm or groin pain -severe or sudden headaches -stomach pain (severe) -sudden shortness of breath -sudden loss of coordination, especially on one side of the body -speech problems -symptoms of vaginal infection like itching, irritation or unusual discharge -tenderness in the upper abdomen -vomiting -weakness or numbness in the arms or legs, especially on one side of the body -yellowing of the eyes or skin Side effects that usually do not require medical attention (report to your doctor or health care professional if they continue or are bothersome): -breakthrough bleeding and spotting that continues beyond the 3 initial cycles of pills -breast tenderness -mood changes, anxiety, depression, frustration, anger, or emotional outbursts -increased sensitivity to sun or ultraviolet light -nausea -skin rash, acne, or brown spots on the skin -weight gain (slight) This list may not describe all possible side effects. Call your doctor for medical advice about side effects. You may report side effects to FDA at 1-800-FDA-1088. Where should I keep my medicine? Keep out of the reach of children. Store at room temperature between 15 and 30 degrees C (59 and 86 degrees F). Throw away any unused medicine after the expiration date. NOTE: This sheet is a summary. It may not cover all possible information. If you have questions about this medicine, talk to your doctor, pharmacist, or health care provider.  2015, [employer]vier/Gold Standard. (2008-02-02 13:40:47)

## 2014-07-18 NOTE — Progress Notes (Signed)
  Subjective:     Sherri [name] is a 29 y.o. female who presents for a postpartum visit. She is 6 weeks postpartum following a low cervical vertical Cesarean section. I have fully reviewed the prenatal and intrapartum course. The delivery was at 35.5 gestational weeks. Outcome: primary cesarean section, low vertical incision due to IUGR and fetal malposition. Anesthesia: epidural. Postpartum course has been uncomplicated. Baby's course has been complicated by extended NICU stay secondary to low birth weight. Baby is feeding by both breast and bottle - Similac Advance. Bleeding no bleeding. Bowel function is normal. Bladder function is normal. Patient is not sexually active. Contraception method is none. Postpartum depression screening: negative. Patient complaining of a vaginal odor     Review of Systems Pertinent items are noted in HPI.   Objective:    There were no vitals taken for this visit.  General:  alert, cooperative and no distress   Breasts:  inspection negative, no nipple discharge or bleeding, no masses or nodularity palpable  Lungs: clear to auscultation bilaterally  Heart:  regular rate and rhythm  Abdomen: soft, non-tender; bowel sounds normal; no masses,  no organomegaly and incision: completely healed. No erythema, induration or drainage   Vulva:  normal  Vagina: normal vagina, no discharge, exudate, lesion, or erythema  Cervix:  no cervical motion tenderness and no lesions  Corpus: normal size, contour, position, consistency, mobility, non-tender  Adnexa:  normal adnexa and no mass, fullness, tenderness  Rectal Exam: Not performed.        Assessment:     Normal postpartum exam. Pap smear done at today's visit.   Plan:    1. Contraception: OCP (estrogen/progesterone) 2. Patient is medically cleared to resume all activities of daily living 3. Wet prep collected 4. Follow up in: 1 year or as needed.

## 2014-07-19 LAB — WET PREP, GENITAL
Clue Cells Wet Prep HPF POC: NONE SEEN
Trich, Wet Prep: NONE SEEN
Yeast Wet Prep HPF POC: NONE SEEN

## 2014-07-19 LAB — CYTOLOGY - PAP

## 2014-07-31 ENCOUNTER — Telehealth: Payer: Self-pay

## 2014-07-31 NOTE — Telephone Encounter (Signed)
Patient called stating she would like her STD/yeast infection results and would also like to know if she can go back to work even though she is breastfeeding/ using breast pump. Attempted to contact patient at number [phone] and heard message that patient can be reached at [phone]. Attempted number given-- no voicemail set up. Unable to leave message.

## 2014-07-31 NOTE — Telephone Encounter (Signed)
Called patient and informed her that she can absolutely go back to work while breastfeeding/pumping. Informed her it is law that her employer allow her to pump. Informed her of negative wet prep results as well. Patient verbalized understanding and gratitude. No further questions or concerns.

## 2014-08-07 ENCOUNTER — Other Ambulatory Visit: Payer: Self-pay | Admitting: Obstetrics & Gynecology

## 2014-08-07 ENCOUNTER — Other Ambulatory Visit: Payer: Self-pay | Admitting: Obstetrics and Gynecology

## 2014-09-07 ENCOUNTER — Other Ambulatory Visit: Payer: Self-pay | Admitting: Obstetrics & Gynecology

## 2014-10-05 ENCOUNTER — Other Ambulatory Visit: Payer: Self-pay | Admitting: Obstetrics & Gynecology

## 2014-10-05 ENCOUNTER — Other Ambulatory Visit: Payer: Self-pay | Admitting: Family

## 2014-10-26 ENCOUNTER — Telehealth: Payer: Self-pay | Admitting: *Deleted

## 2014-10-26 NOTE — Telephone Encounter (Signed)
Patient called in to front office stating she has been throwing up everything she eats and wants to know if she can come in and be seen. Told patient it's possible she could have a stomach virus. Asked patient if there was a possibility of pregnancy and she states no. Patient states she doesn't think it's a stomach virus because this has been going on for 5 days now. Patient also reports fatigue and sometimes has chills. Recommended patient go to urgent care for evaluation. Patient verbalized understanding and had no other questions

## 2014-11-02 ENCOUNTER — Emergency Department (HOSPITAL_COMMUNITY)
Admission: EM | Admit: 2014-11-02 | Discharge: 2014-11-02 | Disposition: A | Discharge status: Home / Self Care | Payer: Medicaid Other | Source: Home / Self Care | Attending: Emergency Medicine | Admitting: Emergency Medicine

## 2014-11-02 ENCOUNTER — Emergency Department (INDEPENDENT_AMBULATORY_CARE_PROVIDER_SITE_OTHER): Payer: Medicaid Other

## 2014-11-02 ENCOUNTER — Encounter (HOSPITAL_COMMUNITY): Payer: Self-pay | Admitting: Emergency Medicine

## 2014-11-02 DIAGNOSIS — R110 Nausea: Secondary | ICD-10-CM

## 2014-11-02 DIAGNOSIS — K5900 Constipation, unspecified: Secondary | ICD-10-CM

## 2014-11-02 DIAGNOSIS — R1084 Generalized abdominal pain: Secondary | ICD-10-CM

## 2014-11-02 LAB — COMPREHENSIVE METABOLIC PANEL
ALT: 15 U/L (ref 14–54)
AST: 20 U/L (ref 15–41)
Albumin: 3.6 g/dL (ref 3.5–5.0)
Alkaline Phosphatase: 59 U/L (ref 38–126)
Anion gap: 7 (ref 5–15)
BUN: 7 mg/dL (ref 6–20)
CO2: 26 mmol/L (ref 22–32)
Calcium: 9.1 mg/dL (ref 8.9–10.3)
Chloride: 106 mmol/L (ref 101–111)
Creatinine, Ser: 0.97 mg/dL (ref 0.44–1.00)
GFR calc Af Amer: >60 mL/min (ref >60)
GFR calc non Af Amer: >60 mL/min (ref >60)
Glucose, Bld: 97 mg/dL (ref 65–99)
Potassium: 3.6 mmol/L (ref 3.5–5.1)
Sodium: 139 mmol/L (ref 135–145)
Total Bilirubin: 0.1 mg/dL — ABNORMAL LOW (ref 0.3–1.2)
Total Protein: 6.4 g/dL — ABNORMAL LOW (ref 6.5–8.1)

## 2014-11-02 LAB — CBC WITH DIFFERENTIAL/PLATELET
Basophils Absolute: 0 10*3/uL (ref 0.0–0.1)
Basophils Relative: 1 % (ref 0–1)
Eosinophils Absolute: 0.1 10*3/uL (ref 0.0–0.7)
Eosinophils Relative: 2 % (ref 0–5)
HCT: 35.8 % — ABNORMAL LOW (ref 36.0–46.0)
Hemoglobin: 11.8 g/dL — ABNORMAL LOW (ref 12.0–15.0)
Lymphocytes Relative: 39 % (ref 12–46)
Lymphs Abs: 2.5 10*3/uL (ref 0.7–4.0)
MCH: 29.1 pg (ref 26.0–34.0)
MCHC: 33 g/dL (ref 30.0–36.0)
MCV: 88.4 fL (ref 78.0–100.0)
Monocytes Absolute: 0.3 10*3/uL (ref 0.1–1.0)
Monocytes Relative: 4 % (ref 3–12)
Neutro Abs: 3.5 10*3/uL (ref 1.7–7.7)
Neutrophils Relative %: 54 % (ref 43–77)
Platelets: 241 10*3/uL (ref 150–400)
RBC: 4.05 MIL/uL (ref 3.87–5.11)
RDW: 14.8 % (ref 11.5–15.5)
WBC: 6.4 10*3/uL (ref 4.0–10.5)

## 2014-11-02 LAB — POCT URINALYSIS DIP (DEVICE)
Bilirubin Urine: NEGATIVE
Glucose, UA: NEGATIVE mg/dL
Hgb urine dipstick: NEGATIVE
Ketones, ur: NEGATIVE mg/dL
Leukocytes, UA: NEGATIVE
Nitrite: NEGATIVE
Protein, ur: NEGATIVE mg/dL
Specific Gravity, Urine: 1.025 (ref 1.005–1.030)
Urobilinogen, UA: 2 mg/dL — ABNORMAL HIGH (ref 0.0–1.0)
pH: 6.5 (ref 5.0–8.0)

## 2014-11-02 LAB — POCT PREGNANCY, URINE: Preg Test, Ur: NEGATIVE

## 2014-11-02 LAB — LIPASE, BLOOD: Lipase: 26 U/L (ref 22–51)

## 2014-11-02 MED ORDER — PEG 3350-KCL-NABCB-NACL-NASULF 236 G PO SOLR: ORAL | Status: DC

## 2014-11-02 MED ORDER — IBUPROFEN 800 MG PO TABS: 800.0000 mg | ORAL_TABLET | Freq: Three times a day (TID) | ORAL | Status: DC | PRN

## 2014-11-02 NOTE — Discharge Instructions (Signed)
Your pain may be coming from constipation. GoLYTELY on your next day off. This will give you diarrhea. Take ibuprofen 800 mg every 8 hours as needed for pain. I will call you with your blood work is abnormal. Follow-up as needed.

## 2014-11-02 NOTE — ED Notes (Addendum)
C/o constant abd pain that radiates to back associated w/emesis and feeling nauseas Has had x1 episode of emesis today Denies fevers, chills, diarrhea Steady gait Pt is Lactating  Alert... No acute distress.

## 2014-11-02 NOTE — ED Provider Notes (Signed)
CSN: [id number]     Arrival date & time 11/02/14  1620 History   First MD Initiated Contact with Patient 11/02/14 1651     Chief Complaint  Patient presents with  . Abdominal Pain   (Consider location/radiation/quality/duration/timing/severity/associated sxs/prior Treatment) HPI  She is a 29 year old woman here for evaluation of abdominal pain and vomiting. She states this is been going on for 2 weeks. She reports diffuse abdominal pains described as sharp and stabbing. It will radiate into her back.  She also reports nausea and vomiting. She states she is able to tolerate liquids just fine, but will often vomit after eating. There is no blood in the vomit. She denies any diarrhea. She reports some constipation. Her last bowel movement was 4 days ago. No dysuria or vaginal discharge. No fevers or chills. Her last period was in mid August. She is on Sprintec for birth control. She had a C-section about 3 months ago and is currently breast-feeding.  Past Medical History  Diagnosis Date  . Uterine fibroid   . Fibromyalgia   . Hypertension   . Urinary tract infection    Past Surgical History  Procedure Laterality Date  . Wisdom tooth extraction    . Cesarean section N/A 06/05/2014    Procedure: CESAREAN SECTION;  Surgeon: Truett Mainland, DO;  Location: [facility];  Service: Obstetrics;  Laterality: N/A;   Family History  Problem Relation Age of Onset  . Colon cancer Neg Hx   . Colon polyps Neg Hx   . Kidney disease Neg Hx   . Diabetes Neg Hx   . Esophageal cancer Neg Hx   . Gallbladder disease Neg Hx   . Heart disease Neg Hx    Social History  Substance Use Topics  . Smoking status: Never Smoker   . Smokeless tobacco: Never Used  . Alcohol Use: 0.0 oz/week    0 Standard drinks or equivalent per week     Comment: occasional when not pregnant   OB History    Gravida Para Term Preterm AB TAB SAB Ectopic Multiple Living   1 1  1     1 2      Review of Systems As in history of present  illness Allergies  Review of patient's allergies indicates no known allergies.  Home Medications   Prior to Admission medications   Medication Sig Start Date End Date Taking? Authorizing Provider  bisacodyl (DULCOLAX) 10 MG suppository Place 10 mg rectally as needed for moderate constipation.    Historical Provider, MD  cyclobenzaprine (FLEXERIL) 10 MG tablet Take 1 tablet (10 mg total) by mouth 3 (three) times daily as needed for muscle spasms. 04/06/14   Luvenia Redden, PA-C  docusate sodium (COLACE) 100 MG capsule Take 1 capsule (100 mg total) by mouth 2 (two) times daily. 04/21/14   Peggy Constant, MD  ibuprofen (ADVIL,MOTRIN) 800 MG tablet Take 1 tablet (800 mg total) by mouth every 8 (eight) hours as needed. 11/02/14   Melony Overly, MD  lansoprazole (PREVACID) 30 MG capsule Take 1 capsule (30 mg total) by mouth 2 (two) times daily before a meal. 1 capsule 30 minutes before breakfast 05/02/14   Woodroe Mode, MD  norgestimate-ethinyl estradiol (ORTHO-CYCLEN,SPRINTEC,PREVIFEM) 0.25-35 MG-MCG tablet Take 1 tablet by mouth daily. 07/18/14   Peggy Constant, MD  ondansetron (ZOFRAN-ODT) 8 MG disintegrating tablet DISSOLVE ONE TABLET IN MOUTH EVERY 12 HOURS 07/05/14   Laray Anger A Anyanwu, MD  phenylephrine (,USE FOR PREPARATION-H,) 0.25 % suppository Place  1 suppository rectally 2 (two) times daily. 04/24/14   Woodroe Mode, MD  polyethylene glycol (GOLYTELY) 236 G solution Drink 8 ounces every 10 minutes until gone or having clear diarrhea. 11/02/14   Melony Overly, MD  Prenatal Vit-Fe Fumarate-FA (PRENATAL MULTIVITAMIN) TABS tablet Take 1 tablet by mouth daily.     Historical Provider, MD   Meds Ordered and Administered this Visit  Medications - No data to display  BP 131/84 mmHg  Pulse 80  Temp(Src) 97.8 F (36.6 C) (Oral)  Resp 16  SpO2 100%  LMP 10/20/2014  Breastfeeding? Yes No data found.   Physical Exam  Constitutional: She is oriented to person, place, and time. She appears well-developed  and well-nourished.  Neck: Neck supple.  Cardiovascular: Normal rate.   Pulmonary/Chest: Effort normal.  Abdominal: Soft. Bowel sounds are normal. She exhibits no distension and no mass. There is tenderness (diffuse). There is no rebound and no guarding.  Neurological: She is alert and oriented to person, place, and time.  Skin: Skin is warm and dry.    ED Course  Procedures (including critical care time)  Labs Review Labs Reviewed  CBC WITH DIFFERENTIAL/PLATELET - Abnormal; Notable for the following:    Hemoglobin 11.8 (*)    HCT 35.8 (*)    All other components within normal limits  COMPREHENSIVE METABOLIC PANEL - Abnormal; Notable for the following:    Total Protein 6.4 (*)    Total Bilirubin 0.1 (*)    All other components within normal limits  POCT URINALYSIS DIP (DEVICE) - Abnormal; Notable for the following:    Urobilinogen, UA 2.0 (*)    All other components within normal limits  LIPASE, BLOOD  POCT PREGNANCY, URINE    Imaging Review Dg Abd 1 View  11/02/2014   CLINICAL DATA:  Rib pain radiating to the back. Constipation for 4 days with nausea and vomiting.  EXAM: ABDOMEN - 1 VIEW  COMPARISON:  04/22/2010  FINDINGS: Levoconvex lumbar scoliosis with rotary component.  Borderline prominence of stool in the colon. No dilated small bowel.  No significant abnormal calcifications are observed  IMPRESSION: 1. Borderline prominence of stool in the colon, potentially reflecting mild constipation. 2. Levoconvex lumbar scoliosis with rotary component.   Electronically Signed   By: Van Clines M.D.   On: 11/02/2014 18:37     MDM   1. Constipation, unspecified constipation type   2. Generalized abdominal pain   3. Nausea    Treat with GoLYTELY. Ibuprofen for pain. Follow-up as needed.    Melony Overly, MD 11/02/14 Einar Crow

## 2014-11-06 ENCOUNTER — Other Ambulatory Visit: Payer: Self-pay | Admitting: Obstetrics & Gynecology

## 2014-11-14 ENCOUNTER — Telehealth: Payer: Self-pay | Admitting: *Deleted

## 2014-11-14 NOTE — Telephone Encounter (Signed)
Pt left message requesting to know if she can take Claritin D while breastfeeding. Per Dr. Ernestina Patches, pt may use the medication and should increase water intake to prevent decreased milk supply. She may also take Zyrtec if she doesn't need a decongestant. I called pt and advised her of the information previously stated.  Pt voiced understanding.

## 2014-12-02 ENCOUNTER — Encounter (HOSPITAL_COMMUNITY): Payer: Self-pay | Admitting: *Deleted

## 2014-12-02 ENCOUNTER — Inpatient Hospital Stay (HOSPITAL_COMMUNITY): Payer: Medicaid Other

## 2014-12-02 ENCOUNTER — Inpatient Hospital Stay (HOSPITAL_COMMUNITY)
Admission: AD | Admit: 2014-12-02 | Discharge: 2014-12-02 | Disposition: A | Discharge status: Home / Self Care | Payer: Medicaid Other | Source: Ambulatory Visit | Attending: Obstetrics | Admitting: Obstetrics

## 2014-12-02 DIAGNOSIS — K5900 Constipation, unspecified: Secondary | ICD-10-CM | POA: Diagnosis not present

## 2014-12-02 DIAGNOSIS — K5901 Slow transit constipation: Secondary | ICD-10-CM

## 2014-12-02 DIAGNOSIS — R109 Unspecified abdominal pain: Secondary | ICD-10-CM | POA: Insufficient documentation

## 2014-12-02 DIAGNOSIS — R1032 Left lower quadrant pain: Secondary | ICD-10-CM | POA: Diagnosis not present

## 2014-12-02 HISTORY — DX: Twin pregnancy, monochorionic/diamniotic, third trimester: O30.033

## 2014-12-02 LAB — URINALYSIS, ROUTINE W REFLEX MICROSCOPIC
Bilirubin Urine: NEGATIVE
Glucose, UA: NEGATIVE mg/dL
Hgb urine dipstick: NEGATIVE
Ketones, ur: NEGATIVE mg/dL
Leukocytes, UA: NEGATIVE
Nitrite: NEGATIVE
Protein, ur: NEGATIVE mg/dL
Specific Gravity, Urine: >1.03 — ABNORMAL HIGH (ref 1.005–1.030)
Urobilinogen, UA: 0.2 mg/dL (ref 0.0–1.0)
pH: 6 (ref 5.0–8.0)

## 2014-12-02 LAB — POCT PREGNANCY, URINE: Preg Test, Ur: NEGATIVE

## 2014-12-02 MED ORDER — PEG 3350-KCL-NABCB-NACL-NASULF 236 G PO SOLR: 4000.0000 mL | Freq: Once | ORAL | Status: DC

## 2014-12-02 MED ORDER — IBUPROFEN 800 MG PO TABS
800.0000 mg | ORAL_TABLET | Freq: Once | ORAL | Status: AC
  Administered 2014-12-02: 800 mg via ORAL
  Filled 2014-12-02: qty 1

## 2014-12-02 NOTE — MAU Note (Signed)
Pt reassessed. Still feeling nauseous. Dr. Ernestina Patches notified.

## 2014-12-02 NOTE — MAU Note (Signed)
Pt states nausea has started to pass. Will wait for ride in the lobby

## 2014-12-02 NOTE — MAU Provider Note (Signed)
History   CSN: [id number] Arrival date and time: 12/02/14 1647 First Provider Initiated Contact with Patient 12/02/14 1802    Chief Complaint  Patient presents with  . Abdominal Pain   HPI  Abdominal pain started 10/1 in the AM, reports worsening in nature. Located on the left side, both top and bottom. No radiation.  Describes as sharp, stabbing pain.  Rates pain as 8 of 10. Reports similar pain when she was pregnant.    Denies N/V. No Fevers, chills. Reports constipation- last BM 2 days ago, no diarrhea. No blood in stool. Denies dysuria, polyuria.  No problems with bleeding dyscrasias.   Tried tylenol yesterday which did not help.   LMP 1 month ago, will begin period next week. On OCPs to prevent pregnancy.  OB History    Gravida Para Term Preterm AB TAB SAB Ectopic Multiple Living   1 1  1     1 2       Past Medical History  Diagnosis Date  . Uterine fibroid   . Hypertension   . Urinary tract infection   . Fibromyalgia     denies today  . 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     Past Surgical History  Procedure Laterality Date  . Wisdom tooth extraction    . Cesarean section N/A 06/05/2014    Procedure: CESAREAN SECTION;  Surgeon: Truett Mainland, DO;  Location: [facility];  Service: Obstetrics;  Laterality: N/A;    Family History  Problem Relation Age of Onset  . Colon cancer Neg Hx   . Colon polyps Neg Hx   . Kidney disease Neg Hx   . Diabetes Neg Hx   . Esophageal cancer Neg Hx   . Gallbladder disease Neg Hx   . Heart disease Neg Hx   . Asthma Neg Hx   . Cancer Neg Hx   . Stroke Neg Hx     Social History  Substance Use Topics  . Smoking status: Never Smoker   . Smokeless tobacco: Never Used  . Alcohol Use: 0.0 oz/week    0 Standard drinks or equivalent per week     Comment: occasional when not pregnant    Allergies: No Known Allergies  Prescriptions prior to admission  Medication Sig Dispense Refill Last Dose  . bisacodyl  (DULCOLAX) 10 MG suppository Place 10 mg rectally as needed for moderate constipation.   Unknown at Unknown time  . cyclobenzaprine (FLEXERIL) 10 MG tablet Take 1 tablet (10 mg total) by mouth 3 (three) times daily as needed for muscle spasms. 30 tablet 0 Unknown at Unknown time  . docusate sodium (COLACE) 100 MG capsule Take 1 capsule (100 mg total) by mouth 2 (two) times daily. 60 capsule 3 Unknown at Unknown time  . ibuprofen (ADVIL,MOTRIN) 800 MG tablet Take 1 tablet (800 mg total) by mouth every 8 (eight) hours as needed. 30 tablet 0   . lansoprazole (PREVACID) 30 MG capsule Take 1 capsule (30 mg total) by mouth 2 (two) times daily before a meal. 1 capsule 30 minutes before breakfast 60 capsule 3 Unknown at Unknown time  . norgestimate-ethinyl estradiol (ORTHO-CYCLEN,SPRINTEC,PREVIFEM) 0.25-35 MG-MCG tablet Take 1 tablet by mouth daily. 1 Package 11 Unknown at Unknown time  . ondansetron (ZOFRAN-ODT) 8 MG disintegrating tablet DISSOLVE ONE TABLET IN MOUTH EVERY 12 HOURS 20 tablet 0 Unknown at Unknown time  . ondansetron (ZOFRAN-ODT) 8 MG disintegrating tablet DISSOLVE ONE TABLET IN MOUTH EVERY 12 HOURS 20 tablet 0   .  phenylephrine (,USE FOR PREPARATION-H,) 0.25 % suppository Place 1 suppository rectally 2 (two) times daily. 12 suppository 0 Unknown at Unknown time  . polyethylene glycol (GOLYTELY) 236 G solution Drink 8 ounces every 10 minutes until gone or having clear diarrhea. 4000 mL 0   . Prenatal Vit-Fe Fumarate-FA (PRENATAL MULTIVITAMIN) TABS tablet Take 1 tablet by mouth daily.    Unknown at Unknown time    Review of Systems  Constitutional: Negative for fever and chills.  Eyes: Negative for blurred vision and double vision.  Respiratory: Negative for cough and shortness of breath.   Cardiovascular: Negative for chest pain and orthopnea.  Gastrointestinal: Positive for abdominal pain and constipation. Negative for nausea, vomiting and diarrhea.  Genitourinary: Negative for dysuria,  frequency and flank pain.  Musculoskeletal: Negative for myalgias.  Skin: Negative for rash.  Neurological: Negative for dizziness, tingling, weakness and headaches.  Endo/Heme/Allergies: Does not bruise/bleed easily.  Psychiatric/Behavioral: Negative for depression and suicidal ideas. The patient is not nervous/anxious.    Physical Exam   Blood pressure 131/84, pulse 69, temperature 98.3 F (36.8 C), temperature source Oral, resp. rate 17, last menstrual period 11/20/2014, currently breastfeeding.  Physical Exam  Nursing note and vitals reviewed. Constitutional: She is oriented to person, place, and time. She appears well-developed and well-nourished. No distress.  Conversant, not in distress  HENT:  Head: Normocephalic and atraumatic.  Eyes: Conjunctivae are normal. No scleral icterus.  Neck: Normal range of motion. Neck supple.  Cardiovascular: Normal rate and intact distal pulses.   Respiratory: Effort normal. She exhibits no tenderness.  GI: Soft. Normal appearance and bowel sounds are normal. She exhibits no distension. There is no hepatosplenomegaly. There is tenderness in the left lower quadrant. There is no rebound, no guarding, no CVA tenderness and no tenderness at McBurney's point. No hernia. Hernia confirmed negative in the ventral area.  Genitourinary: Vagina normal.  Musculoskeletal: Normal range of motion. She exhibits no edema.  Neurological: She is alert and oriented to person, place, and time.  Skin: Skin is warm and dry. No rash noted.  Psychiatric: She has a normal mood and affect.    MAU Course  Procedures  MDM UA- negative, non infectious appearing UPT- negative XR abdomen- mod stool in left colon, improved from prior but still present Ibuprofen x1 for pain   Assessment and Plan  #Abdominal Pain: likely related to constipation.  Exam is benign and abdomen is not concerning for acute intraabdominal process or acute bacterial infection.  - Rx sent for  golytely to help with bowel cleanout - Declined patient request for narcotics as this will not help bowel motility and explained this to patient. - start daily miralax after cleanout - follow up with PCP in 1 week- gave information for family medicine center to establish care.  - Reviewed return precautions  Caren Macadam 12/02/2014, 7:04 PM

## 2014-12-02 NOTE — Discharge Instructions (Signed)

## 2014-12-02 NOTE — MAU Note (Signed)
Having really bad pain on left side. Started yesterday, took some Tylenol, no relief.   Has a really bad HA- it is still there.  No meds today.

## 2014-12-02 NOTE — MAU Note (Signed)
Pt called out complaining of nausea. Reassured pt that it was normal considering she hasn't eaten anything all day. Instructed pt that with a meal, the nausea should resolve. Pt verbalized understanding but wishes to wait in room to see if she feels better. Dr. Ernestina Patches notified and agrees.

## 2014-12-04 ENCOUNTER — Encounter (HOSPITAL_COMMUNITY): Payer: Self-pay

## 2014-12-04 ENCOUNTER — Inpatient Hospital Stay (HOSPITAL_COMMUNITY)
Admission: AD | Admit: 2014-12-04 | Discharge: 2014-12-04 | Disposition: A | Discharge status: Home / Self Care | Payer: Medicaid Other | Source: Ambulatory Visit | Attending: Obstetrics & Gynecology | Admitting: Obstetrics & Gynecology

## 2014-12-04 DIAGNOSIS — N644 Mastodynia: Secondary | ICD-10-CM

## 2014-12-04 MED ORDER — IBUPROFEN 800 MG PO TABS: 800.0000 mg | ORAL_TABLET | Freq: Three times a day (TID) | ORAL | Status: DC

## 2014-12-04 MED ORDER — KETOROLAC TROMETHAMINE 60 MG/2ML IM SOLN
60.0000 mg | Freq: Once | INTRAMUSCULAR | Status: AC
  Administered 2014-12-04: 60 mg via INTRAMUSCULAR
  Filled 2014-12-04: qty 2

## 2014-12-04 NOTE — MAU Note (Signed)
Pt C/O very sore breasts for last 2-3 days, pt is pumping, states she has not had any milk also for the last 2-3 days.  Pt has 49 month old twins.

## 2014-12-05 NOTE — MAU Provider Note (Signed)
History     CSN: [id number]  Arrival date and time: 12/04/14 1636   First Provider Initiated Contact with Patient 12/04/14 1915      Chief Complaint  Patient presents with  . Breast Pain   HPI(late entry) Pt is not pregnant and 6 mos post partum with pumping her breasts .  Pt is also giving formula. Pt c/o of sore breasts for about 2 to 3 days.  She is using a pump but is not getting milk.  Pt has tried ibuprofen without any relief. Pt denies chills or fever, redness. Pt states they were hurting so bad last night that she could not sleep. Pt is on Sprintec OCs for contraception. Pt's pain score is 9/10   RN note: Corinna Capra, RN Registered Nurse Signed  MAU Note 12/04/2014 4:48 PM    Expand All Collapse All   Pt C/O very sore breasts for last 2-3 days, pt is pumping, states she has not had any milk also for the last 2-3 days. Pt has 36 month old twins.       Past Medical History  Diagnosis Date  . Uterine fibroid   . Hypertension   . Urinary tract infection   . Fibromyalgia     denies today  . 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     Past Surgical History  Procedure Laterality Date  . Wisdom tooth extraction    . Cesarean section N/A 06/05/2014    Procedure: CESAREAN SECTION;  Surgeon: Truett Mainland, DO;  Location: [facility];  Service: Obstetrics;  Laterality: N/A;    Family History  Problem Relation Age of Onset  . Colon cancer Neg Hx   . Colon polyps Neg Hx   . Kidney disease Neg Hx   . Diabetes Neg Hx   . Esophageal cancer Neg Hx   . Gallbladder disease Neg Hx   . Heart disease Neg Hx   . Asthma Neg Hx   . Cancer Neg Hx   . Stroke Neg Hx     Social History  Substance Use Topics  . Smoking status: Never Smoker   . Smokeless tobacco: Never Used  . Alcohol Use: 0.0 oz/week    0 Standard drinks or equivalent per week     Comment: occasional when not pregnant    Allergies: No Known Allergies  No prescriptions prior to admission     Review of Systems  Constitutional: Negative for fever and chills.  Gastrointestinal: Negative for nausea, vomiting, abdominal pain, diarrhea and constipation.  Genitourinary: Negative for dysuria and urgency.       Breast tenderness/pain   Physical Exam   Blood pressure 112/68, pulse 70, temperature 98.3 F (36.8 C), temperature source Oral, resp. rate 18, last menstrual period 12/04/2014, currently breastfeeding.  Physical Exam  Nursing note and vitals reviewed. Constitutional: She appears well-developed. No distress.  HENT:  Head: Normocephalic.  Eyes: Pupils are equal, round, and reactive to light.  Neck: Normal range of motion. Neck supple.  Cardiovascular: Normal rate.   Respiratory: Effort normal.  Breast exam: not redness or engorgement noted; fullness in left lower quadrant that is tender; right breast less full; no milk expressed  GI: Soft.  Musculoskeletal: Normal range of motion.  Neurological: She is alert.  Skin: Skin is warm and dry.  Psychiatric: She has a normal mood and affect.    MAU Course  Procedures Toradol 60mg  Im given with some improvement in pain to 4/10  Assessment and Plan  Breast  tenderness- discussed weaning- recommend Motrin/Tylenol, tight sports bra or binding; cabbage leaves Return if increase in pain or fever  Gavriel Holzhauer 12/05/2014, 5:34 PM

## 2014-12-30 ENCOUNTER — Other Ambulatory Visit: Payer: Self-pay | Admitting: Family

## 2015-01-07 ENCOUNTER — Other Ambulatory Visit: Payer: Self-pay | Admitting: Obstetrics & Gynecology

## 2015-03-04 ENCOUNTER — Encounter (HOSPITAL_COMMUNITY): Payer: Self-pay

## 2015-03-04 ENCOUNTER — Inpatient Hospital Stay (HOSPITAL_COMMUNITY)
Admission: AD | Admit: 2015-03-04 | Discharge: 2015-03-04 | Disposition: A | Discharge status: Home / Self Care | Payer: Medicaid Other | Source: Ambulatory Visit | Attending: Family Medicine | Admitting: Family Medicine

## 2015-03-04 DIAGNOSIS — J069 Acute upper respiratory infection, unspecified: Secondary | ICD-10-CM | POA: Insufficient documentation

## 2015-03-04 DIAGNOSIS — R0981 Nasal congestion: Secondary | ICD-10-CM | POA: Diagnosis present

## 2015-03-04 DIAGNOSIS — R05 Cough: Secondary | ICD-10-CM | POA: Diagnosis not present

## 2015-03-04 DIAGNOSIS — R059 Cough, unspecified: Secondary | ICD-10-CM

## 2015-03-04 LAB — URINALYSIS, ROUTINE W REFLEX MICROSCOPIC
Bilirubin Urine: NEGATIVE
Glucose, UA: NEGATIVE mg/dL
Hgb urine dipstick: NEGATIVE
Ketones, ur: 15 mg/dL — AB
Leukocytes, UA: NEGATIVE
Nitrite: NEGATIVE
Protein, ur: NEGATIVE mg/dL
Specific Gravity, Urine: 1.02 (ref 1.005–1.030)
pH: 8 (ref 5.0–8.0)

## 2015-03-04 LAB — INFLUENZA PANEL BY PCR (TYPE A & B)
H1N1 flu by pcr: NOT DETECTED
Influenza A By PCR: NEGATIVE
Influenza B By PCR: NEGATIVE

## 2015-03-04 LAB — POCT PREGNANCY, URINE: Preg Test, Ur: NEGATIVE

## 2015-03-04 MED ORDER — BENZONATATE 200 MG PO CAPS: 200.0000 mg | ORAL_CAPSULE | Freq: Three times a day (TID) | ORAL | Status: DC | PRN

## 2015-03-04 NOTE — MAU Note (Addendum)
Pt C/O congestion & stuffiness, throat hurts.  Has tried OTC Dayquil, Vicks, Allegra with no results.  Productive cough of yellow mucus.  Vomited once this a.m., no diarrhea.  Also C/O mid chest , non-radiating, no SOB noted.  Hurts worse with coughing.

## 2015-03-04 NOTE — MAU Provider Note (Signed)
History     CSN: [id number]  Arrival date and time: 03/04/15 1318   First Provider Initiated Contact with Patient 03/04/15 1413      Chief Complaint  Patient presents with  . Cough  . Nasal Congestion   HPI   Ms.Sherri [name] is a 30 y.o. female [phone] presenting to MAU with URI symptoms times one week. Symptoms include congestion, sinus pressure, cough, and chest discomfort. She has tried Human resources officer, dayquil and she feels it is not getting better.   She denies fever.  She has been using a humidifier at home.  Patient has not taken any cold medicine today. She has been inconsistent with taking over the counter medications for the symptoms   OB History    Gravida Para Term Preterm AB TAB SAB Ectopic Multiple Living   1 1  1     1 2       Past Medical History  Diagnosis Date  . Uterine fibroid   . Hypertension   . Urinary tract infection   . Fibromyalgia     denies today  . 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     Past Surgical History  Procedure Laterality Date  . Wisdom tooth extraction    . Cesarean section N/A 06/05/2014    Procedure: CESAREAN SECTION;  Surgeon: Truett Mainland, DO;  Location: [facility];  Service: Obstetrics;  Laterality: N/A;    Family History  Problem Relation Age of Onset  . Colon cancer Neg Hx   . Colon polyps Neg Hx   . Kidney disease Neg Hx   . Diabetes Neg Hx   . Esophageal cancer Neg Hx   . Gallbladder disease Neg Hx   . Heart disease Neg Hx   . Asthma Neg Hx   . Cancer Neg Hx   . Stroke Neg Hx     Social History  Substance Use Topics  . Smoking status: Never Smoker   . Smokeless tobacco: Never Used  . Alcohol Use: 0.0 oz/week    0 Standard drinks or equivalent per week     Comment: occasional when not pregnant    Allergies: No Known Allergies  No prescriptions prior to admission   Results for orders placed or performed during the hospital encounter of 03/04/15 (from the past 48 hour(s))  Urinalysis,  Routine w reflex microscopic (not at [facility])     Status: Abnormal   Collection Time: 03/04/15  2:00 PM  Result Value Ref Range   Color, Urine YELLOW YELLOW   APPearance CLEAR CLEAR   Specific Gravity, Urine 1.020 1.005 - 1.030   pH 8.0 5.0 - 8.0   Glucose, UA NEGATIVE NEGATIVE mg/dL   Hgb urine dipstick NEGATIVE NEGATIVE   Bilirubin Urine NEGATIVE NEGATIVE   Ketones, ur 15 (A) NEGATIVE mg/dL   Protein, ur NEGATIVE NEGATIVE mg/dL   Nitrite NEGATIVE NEGATIVE   Leukocytes, UA NEGATIVE NEGATIVE    Comment: MICROSCOPIC NOT DONE ON URINES WITH NEGATIVE PROTEIN, BLOOD, LEUKOCYTES, NITRITE, OR GLUCOSE <1000 mg/dL.  Pregnancy, urine POC     Status: None   Collection Time: 03/04/15  2:09 PM  Result Value Ref Range   Preg Test, Ur NEGATIVE NEGATIVE    Comment:        THE SENSITIVITY OF THIS METHODOLOGY IS >24 mIU/mL     Review of Systems  Constitutional: Positive for chills. Negative for fever.  Respiratory: Positive for cough. Negative for sputum production and wheezing.   Neurological: Positive for headaches.  Physical Exam   Blood pressure 127/74, pulse 80, temperature 98.1 F (36.7 C), temperature source Oral, resp. rate 16, last menstrual period 03/24/2014, SpO2 98 %, currently breastfeeding.  Physical Exam  Nursing note and vitals reviewed. Constitutional: She is oriented to person, place, and time. Vital signs are normal. She appears well-developed and well-nourished.  Non-toxic appearance. She does not have a sickly appearance. She does not appear ill. No distress.  HENT:  Head: Normocephalic.  Nose: No rhinorrhea. Right sinus exhibits no maxillary sinus tenderness and no frontal sinus tenderness. Left sinus exhibits no maxillary sinus tenderness and no frontal sinus tenderness.  Mouth/Throat: Uvula is midline, oropharynx is clear and moist and mucous membranes are normal. No oropharyngeal exudate, posterior oropharyngeal edema, posterior oropharyngeal erythema or tonsillar  abscesses.  Bilateral turbinates with erythema   Eyes: Pupils are equal, round, and reactive to light.  Neck: Normal range of motion.  Cardiovascular: Normal rate and regular rhythm.   Respiratory: Effort normal and breath sounds normal. No accessory muscle usage. No tachypnea. No respiratory distress. She has no decreased breath sounds. She exhibits no tenderness.  Coughing not noted while patient in MAU   Musculoskeletal: Normal range of motion.  Neurological: She is alert and oriented to person, place, and time.  Skin: Skin is warm. She is not diaphoretic.  Psychiatric: Her behavior is normal.    MAU Course  Procedures  None  MDM  Patient requesting work note Patient requesting flu swab  Assessment and Plan   A:  1. Viral URI   2. Cough     P:  Discharge home in stable condition Will call the patient if flu swab is positive, although I anticipate it to be negative  Cool mist humidifier Over the counter allergy medicine Rx: Tessalon pearls  Work noted provided  If symptoms worsen, go to urgent care.   Lezlie Lye, NP 03/04/2015 3:06 PM

## 2015-03-04 NOTE — Discharge Instructions (Signed)
Cool Mist Vaporizers Vaporizers may help relieve the symptoms of a cough and cold. They add moisture to the air, which helps mucus to become thinner and less sticky. This makes it easier to breathe and cough up secretions. Cool mist vaporizers do not cause serious burns like hot mist vaporizers, which may also be called steamers or humidifiers. Vaporizers have not been proven to help with colds. You should not use a vaporizer if you are allergic to mold. HOME CARE INSTRUCTIONS  Follow the package instructions for the vaporizer.  Do not use anything other than distilled water in the vaporizer.  Do not run the vaporizer all of the time. This can cause mold or bacteria to grow in the vaporizer.  Clean the vaporizer after each time it is used.  Clean and dry the vaporizer well before storing it.  Stop using the vaporizer if worsening respiratory symptoms develop.   This information is not intended to replace advice given to you by your health care provider. Make sure you discuss any questions you have with your health care provider.   Document Released: 11/14/2003 Document Revised: 02/21/2013 Document Reviewed: 07/06/2012 Elsevier Interactive Patient Education 2016 Elsevier Inc.  Cough, Adult A cough helps to clear your throat and lungs. A cough may last only 2-3 weeks (acute), or it may last longer than 8 weeks (chronic). Many different things can cause a cough. A cough may be a sign of an illness or another medical condition. HOME CARE  Pay attention to any changes in your cough.  Take medicines only as told by your doctor.  If you were prescribed an antibiotic medicine, take it as told by your doctor. Do not stop taking it even if you start to feel better.  Talk with your doctor before you try using a cough medicine.  Drink enough fluid to keep your pee (urine) clear or pale yellow.  If the air is dry, use a cold steam vaporizer or humidifier in your home.  Stay away from things  that make you cough at work or at home.  If your cough is worse at night, try using extra pillows to raise your head up higher while you sleep.  Do not smoke, and try not to be around smoke. If you need help quitting, ask your doctor.  Do not have caffeine.  Do not drink alcohol.  Rest as needed. GET HELP IF:  You have new problems (symptoms).  You cough up yellow fluid (pus).  Your cough does not get better after 2-3 weeks, or your cough gets worse.  Medicine does not help your cough and you are not sleeping well.  You have pain that gets worse or pain that is not helped with medicine.  You have a fever.  You are losing weight and you do not know why.  You have night sweats. GET HELP RIGHT AWAY IF:  You cough up blood.  You have trouble breathing.  Your heartbeat is very fast.   This information is not intended to replace advice given to you by your health care provider. Make sure you discuss any questions you have with your health care provider.   Document Released: 10/30/2010 Document Revised: 11/07/2014 Document Reviewed: 04/25/2014 Elsevier Interactive Patient Education 2016 [employer].  Allergies An allergy is an abnormal reaction to a substance by the body's defense system (immune system). Allergies can develop at any age. WHAT CAUSES ALLERGIES? An allergic reaction happens when the immune system mistakenly reacts to a normally harmless  substance, called an allergen, as if it were harmful. The immune system releases antibodies to fight the substance. Antibodies eventually release a chemical called histamine into the bloodstream. The release of histamine is meant to protect the body from infection, but it also causes discomfort. An allergic reaction can be triggered by:  Eating an allergen.  Inhaling an allergen.  Touching an allergen. WHAT TYPES OF ALLERGIES ARE THERE? There are many types of allergies. Common types include:  Seasonal allergies. People  with this type of allergy are usually allergic to substances that are only present during certain seasons, such as molds and pollens.  Food allergies.  Drug allergies.  Insect allergies.  Animal dander allergies. WHAT ARE SYMPTOMS OF ALLERGIES? Possible allergy symptoms include:  Swelling of the lips, face, tongue, mouth, or throat.  Sneezing, coughing, or wheezing.  Nasal congestion.  Tingling in the mouth.  Rash.  Itching.  Itchy, red, swollen areas of skin (hives).  Watery eyes.  Vomiting.  Diarrhea.  Dizziness.  Lightheadedness.  Fainting.  Trouble breathing or swallowing.  Chest tightness.  Rapid heartbeat. HOW ARE ALLERGIES DIAGNOSED? Allergies are diagnosed with a medical and family history and one or more of the following:  Skin tests.  Blood tests.  A food diary. A food diary is a record of all the foods and drinks you have in a day and of all the symptoms you experience.  The results of an elimination diet. An elimination diet involves eliminating foods from your diet and then adding them back in one by one to find out if a certain food causes an allergic reaction. HOW ARE ALLERGIES TREATED? There is no cure for allergies, but allergic reactions can be treated with medicine. Severe reactions usually need to be treated at a hospital. HOW CAN REACTIONS BE PREVENTED? The best way to prevent an allergic reaction is by avoiding the substance you are allergic to. Allergy shots and medicines can also help prevent reactions in some cases. People with severe allergic reactions may be able to prevent a life-threatening reaction called anaphylaxis with a medicine given right after exposure to the allergen.   This information is not intended to replace advice given to you by your health care provider. Make sure you discuss any questions you have with your health care provider.   Document Released: 05/12/2002 Document Revised: 03/09/2014 Document Reviewed:  11/28/2013 Elsevier Interactive Patient Education [employer].

## 2015-07-18 ENCOUNTER — Inpatient Hospital Stay (HOSPITAL_COMMUNITY)
Admission: AD | Admit: 2015-07-18 | Discharge: 2015-07-18 | Disposition: A | Discharge status: Home / Self Care | Payer: Medicaid Other | Source: Ambulatory Visit | Attending: Obstetrics & Gynecology | Admitting: Obstetrics & Gynecology

## 2015-07-18 DIAGNOSIS — R05 Cough: Secondary | ICD-10-CM | POA: Diagnosis present

## 2015-07-18 DIAGNOSIS — I10 Essential (primary) hypertension: Secondary | ICD-10-CM | POA: Diagnosis not present

## 2015-07-18 DIAGNOSIS — J069 Acute upper respiratory infection, unspecified: Secondary | ICD-10-CM | POA: Insufficient documentation

## 2015-07-18 NOTE — MAU Provider Note (Signed)
History     CSN: [id number]  Arrival date and time: 07/18/15 2226   First Provider Initiated Contact with Patient 07/18/15 2253      Chief Complaint  Patient presents with  . URI   HPI Comments: Sherri [name] is a 30 y.o. Who presents today with a cough, sore throat and congestion x 3 days. She has a hx of allergies.   URI  This is a new problem. Episode onset: 3 days ago. The problem has been unchanged. There has been no fever. Associated symptoms include congestion, coughing, headaches, sneezing, a sore throat and vomiting (after coughing ). Pertinent negatives include no ear pain or wheezing. She has tried antihistamine for the symptoms. The treatment provided no relief.     Past Medical History  Diagnosis Date  . Uterine fibroid   . Hypertension   . Urinary tract infection   . Fibromyalgia     denies today  . 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     Past Surgical History  Procedure Laterality Date  . Wisdom tooth extraction    . Cesarean section N/A 06/05/2014    Procedure: CESAREAN SECTION;  Surgeon: Truett Mainland, DO;  Location: [facility];  Service: Obstetrics;  Laterality: N/A;    Family History  Problem Relation Age of Onset  . Colon cancer Neg Hx   . Colon polyps Neg Hx   . Kidney disease Neg Hx   . Diabetes Neg Hx   . Esophageal cancer Neg Hx   . Gallbladder disease Neg Hx   . Heart disease Neg Hx   . Asthma Neg Hx   . Cancer Neg Hx   . Stroke Neg Hx     Social History  Substance Use Topics  . Smoking status: Never Smoker   . Smokeless tobacco: Never Used  . Alcohol Use: 0.0 oz/week    0 Standard drinks or equivalent per week     Comment: occasional when not pregnant    Allergies: No Known Allergies  Prescriptions prior to admission  Medication Sig Dispense Refill Last Dose  . benzonatate (TESSALON) 200 MG capsule Take 1 capsule (200 mg total) by mouth 3 (three) times daily as needed for cough. 20 capsule 0   .  ibuprofen (ADVIL,MOTRIN) 800 MG tablet Take 1 tablet (800 mg total) by mouth every 8 (eight) hours as needed. 30 tablet 0 Past Month at Unknown time  . lansoprazole (PREVACID) 30 MG capsule Take 1 capsule (30 mg total) by mouth 2 (two) times daily before a meal. 1 capsule 30 minutes before breakfast (Patient not taking: Reported on 12/02/2014) 60 capsule 3 Not Taking at Unknown time  . norgestimate-ethinyl estradiol (ORTHO-CYCLEN,SPRINTEC,PREVIFEM) 0.25-35 MG-MCG tablet Take 1 tablet by mouth daily. 1 Package 11 12/02/2014 at Unknown time  . Prenatal Vit-Fe Fumarate-FA (PRENATAL MULTIVITAMIN) TABS tablet Take 1 tablet by mouth daily.    12/02/2014 at Unknown time    Review of Systems  Constitutional: Negative for fever.  HENT: Positive for congestion, sneezing and sore throat. Negative for ear pain.   Respiratory: Positive for cough. Negative for shortness of breath and wheezing.   Gastrointestinal: Positive for vomiting (after coughing ).  Neurological: Positive for headaches.   Physical Exam   Blood pressure 140/88, pulse 87, temperature 98.7 F (37.1 C), temperature source Oral, resp. rate 16, height 4\' 11"  (1.499 m), weight 68.947 kg (152 lb), last menstrual period 06/02/2015, SpO2 98 %, currently breastfeeding.  Physical Exam  Nursing note and  vitals reviewed. Constitutional: She is oriented to person, place, and time. She appears well-developed and well-nourished. No distress.  HENT:  Head: Normocephalic.  Right Ear: No tenderness. Tympanic membrane is not erythematous, not retracted and not bulging. No middle ear effusion.  Left Ear: No tenderness. Tympanic membrane is not erythematous, not retracted and not bulging.  No middle ear effusion.  Mouth/Throat: No oropharyngeal exudate, posterior oropharyngeal edema, posterior oropharyngeal erythema or tonsillar abscesses.  Cardiovascular: Normal rate.   Respiratory: Effort normal and breath sounds normal. No respiratory distress.  GI:  Soft. There is no tenderness. There is no rebound.  Neurological: She is alert and oriented to person, place, and time.  Skin: Skin is warm and dry.  Psychiatric: She has a normal mood and affect.    MAU Course  Procedures  MDM   Assessment and Plan   1. URI (upper respiratory infection)    DC home Comfort measures reviewed  RX: none, OTC treatments recommended     Follow-up Information    Follow up with [facility]   Specialty:  Urgent Care   Why:  If symptoms worsen   Contact information:   [place] [phone]        Sherri, [name] 07/18/2015, 10:56 PM

## 2015-07-18 NOTE — Discharge Instructions (Signed)
Upper Respiratory Infection, Adult Most upper respiratory infections (URIs) are a viral infection of the air passages leading to the lungs. A URI affects the nose, throat, and upper air passages. The most common type of URI is nasopharyngitis and is typically referred to as "the common cold." URIs run their course and usually go away on their own. Most of the time, a URI does not require medical attention, but sometimes a bacterial infection in the upper airways can follow a viral infection. This is called a secondary infection. Sinus and middle ear infections are common types of secondary upper respiratory infections. Bacterial pneumonia can also complicate a URI. A URI can worsen asthma and chronic obstructive pulmonary disease (COPD). Sometimes, these complications can require emergency medical care and may be life threatening.  CAUSES Almost all URIs are caused by viruses. A virus is a type of germ and can spread from one person to another.  RISKS FACTORS You may be at risk for a URI if:   You smoke.   You have chronic heart or lung disease.  You have a weakened defense (immune) system.   You are very young or very old.   You have nasal allergies or asthma.  You work in crowded or poorly ventilated areas.  You work in health care facilities or schools. SIGNS AND SYMPTOMS  Symptoms typically develop 2-3 days after you come in contact with a cold virus. Most viral URIs last 7-10 days. However, viral URIs from the influenza virus (flu virus) can last 14-18 days and are typically more severe. Symptoms may include:   Runny or stuffy (congested) nose.   Sneezing.   Cough.   Sore throat.   Headache.   Fatigue.   Fever.   Loss of appetite.   Pain in your forehead, behind your eyes, and over your cheekbones (sinus pain).  Muscle aches.  DIAGNOSIS  Your health care provider may diagnose a URI by:  Physical exam.  Tests to check that your symptoms are not due to  another condition such as:  Strep throat.  Sinusitis.  Pneumonia.  Asthma. TREATMENT  A URI goes away on its own with time. It cannot be cured with medicines, but medicines may be prescribed or recommended to relieve symptoms. Medicines may help:  Reduce your fever.  Reduce your cough.  Relieve nasal congestion. HOME CARE INSTRUCTIONS   Take medicines only as directed by your health care provider.   Gargle warm saltwater or take cough drops to comfort your throat as directed by your health care provider.  Use a warm mist humidifier or inhale steam from a shower to increase air moisture. This may make it easier to breathe.  Drink enough fluid to keep your urine clear or pale yellow.   Eat soups and other clear broths and maintain good nutrition.   Rest as needed.   Return to work when your temperature has returned to normal or as your health care provider advises. You may need to stay home longer to avoid infecting others. You can also use a face mask and careful hand washing to prevent spread of the virus.  Increase the usage of your inhaler if you have asthma.   Do not use any tobacco products, including cigarettes, chewing tobacco, or electronic cigarettes. If you need help quitting, ask your health care provider. PREVENTION  The best way to protect yourself from getting a cold is to practice good hygiene.   Avoid oral or hand contact with people with cold   symptoms.   Wash your hands often if contact occurs.  There is no clear evidence that vitamin C, vitamin E, echinacea, or exercise reduces the chance of developing a cold. However, it is always recommended to get plenty of rest, exercise, and practice good nutrition.  SEEK MEDICAL CARE IF:   You are getting worse rather than better.   Your symptoms are not controlled by medicine.   You have chills.  You have worsening shortness of breath.  You have brown or red mucus.  You have yellow or brown nasal  discharge.  You have pain in your face, especially when you bend forward.  You have a fever.  You have swollen neck glands.  You have pain while swallowing.  You have white areas in the back of your throat. SEEK IMMEDIATE MEDICAL CARE IF:   You have severe or persistent:  Headache.  Ear pain.  Sinus pain.  Chest pain.  You have chronic lung disease and any of the following:  Wheezing.  Prolonged cough.  Coughing up blood.  A change in your usual mucus.  You have a stiff neck.  You have changes in your:  Vision.  Hearing.  Thinking.  Mood. MAKE SURE YOU:   Understand these instructions.  Will watch your condition.  Will get help right away if you are not doing well or get worse.   This information is not intended to replace advice given to you by your health care provider. Make sure you discuss any questions you have with your health care provider.   Document Released: 08/12/2000 Document Revised: 07/03/2014 Document Reviewed: 05/24/2013 Elsevier Interactive Patient Education 2016 [employer]  

## 2015-07-18 NOTE — MAU Note (Signed)
Pt reports she has had vomiting, sore throat, cough and sneezing for the last 3 days.

## 2015-07-21 ENCOUNTER — Encounter (HOSPITAL_COMMUNITY): Payer: Self-pay | Admitting: Emergency Medicine

## 2015-07-21 ENCOUNTER — Ambulatory Visit (HOSPITAL_COMMUNITY)
Admission: EM | Admit: 2015-07-21 | Discharge: 2015-07-21 | Disposition: A | Discharge status: Home / Self Care | Payer: Medicaid Other | Attending: Emergency Medicine | Admitting: Emergency Medicine

## 2015-07-21 DIAGNOSIS — J40 Bronchitis, not specified as acute or chronic: Secondary | ICD-10-CM

## 2015-07-21 DIAGNOSIS — J029 Acute pharyngitis, unspecified: Secondary | ICD-10-CM

## 2015-07-21 MED ORDER — HYDROCOD POLST-CPM POLST ER 10-8 MG/5ML PO SUER: 5.0000 mL | Freq: Two times a day (BID) | ORAL | Status: DC | PRN

## 2015-07-21 MED ORDER — AZITHROMYCIN 250 MG PO TABS: ORAL_TABLET | ORAL | Status: DC

## 2015-07-21 NOTE — Discharge Instructions (Signed)

## 2015-07-21 NOTE — ED Provider Notes (Signed)
CSN: [id number]     Arrival date & time 07/21/15  1945 History   First MD Initiated Contact with Patient 07/21/15 2026     No chief complaint on file.  (Consider location/radiation/quality/duration/timing/severity/associated sxs/prior Treatment) Patient is a 30 y.o. female presenting with cough and pharyngitis. No language interpreter was used.  Cough Cough characteristics:  Productive Sputum characteristics:  Yellow Severity:  Moderate Onset quality:  Gradual Duration:  8 days (more than 8 days) Timing:  Constant Progression:  Worsening Chronicity:  New Smoker: no   Context: not occupational exposure, not sick contacts, not smoke exposure and not weather changes   Relieved by:  Nothing Worsened by:  Nothing tried Ineffective treatments:  Decongestant (benadryl, claritin) Associated symptoms: chest pain and sore throat   Associated symptoms: no ear pain, no eye discharge, no fever, no headaches, no shortness of breath and no wheezing   Risk factors: no recent infection and no recent travel   Sore Throat This is a new problem. The current episode started more than 1 week ago. The problem occurs constantly. The problem has not changed since onset.Associated symptoms include chest pain. Pertinent negatives include no headaches and no shortness of breath. Nothing aggravates the symptoms. Nothing relieves the symptoms. She has tried acetaminophen for the symptoms. The treatment provided no relief.    Past Medical History  Diagnosis Date  . Uterine fibroid   . Hypertension   . Urinary tract infection   . Fibromyalgia     denies today  . 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    Past Surgical History  Procedure Laterality Date  . Wisdom tooth extraction    . Cesarean section N/A 06/05/2014    Procedure: CESAREAN SECTION;  Surgeon: Truett Mainland, DO;  Location: [facility];  Service: Obstetrics;  Laterality: N/A;   Family History  Problem Relation Age of Onset  .  Colon cancer Neg Hx   . Colon polyps Neg Hx   . Kidney disease Neg Hx   . Diabetes Neg Hx   . Esophageal cancer Neg Hx   . Gallbladder disease Neg Hx   . Heart disease Neg Hx   . Asthma Neg Hx   . Cancer Neg Hx   . Stroke Neg Hx    Social History  Substance Use Topics  . Smoking status: Never Smoker   . Smokeless tobacco: Never Used  . Alcohol Use: 0.0 oz/week    0 Standard drinks or equivalent per week     Comment: occasional when not pregnant   OB History    Gravida Para Term Preterm AB TAB SAB Ectopic Multiple Living   1 1  1     1 2      Review of Systems  Constitutional: Negative for fever.  HENT: Positive for sore throat. Negative for ear pain.   Eyes: Negative for discharge.  Respiratory: Positive for cough. Negative for shortness of breath and wheezing.   Cardiovascular: Positive for chest pain.  Neurological: Negative for headaches.  All other systems reviewed and are negative.   Allergies  Review of patient's allergies indicates no known allergies.  Home Medications   Prior to Admission medications   Medication Sig Start Date End Date Taking? Authorizing Provider  benzonatate (TESSALON) 200 MG capsule Take 1 capsule (200 mg total) by mouth 3 (three) times daily as needed for cough. 03/04/15   Lezlie Lye, NP  ibuprofen (ADVIL,MOTRIN) 800 MG tablet Take 1 tablet (800 mg total) by mouth every  8 (eight) hours as needed. 11/02/14   Melony Overly, MD  lansoprazole (PREVACID) 30 MG capsule Take 1 capsule (30 mg total) by mouth 2 (two) times daily before a meal. 1 capsule 30 minutes before breakfast Patient not taking: Reported on 12/02/2014 05/02/14   Woodroe Mode, MD  norgestimate-ethinyl estradiol (ORTHO-CYCLEN,SPRINTEC,PREVIFEM) 0.25-35 MG-MCG tablet Take 1 tablet by mouth daily. 07/18/14   Mora Bellman, MD  Prenatal Vit-Fe Fumarate-FA (PRENATAL MULTIVITAMIN) TABS tablet Take 1 tablet by mouth daily.     Historical Provider, MD   Meds Ordered and Administered  this Visit  Medications - No data to display  BP 133/84 mmHg  Pulse 85  Temp(Src) 98.7 F (37.1 C) (Oral)  Resp 18  SpO2 100%  LMP 06/02/2015 No data found.   Physical Exam  Constitutional: She is oriented to person, place, and time. She appears well-developed. No distress.  HENT:  Head: Normocephalic.  Right Ear: Tympanic membrane and ear canal normal. No lacerations. No drainage or tenderness. Tympanic membrane is not injected. Tympanic membrane mobility is normal.  Left Ear: Tympanic membrane, external ear and ear canal normal. No lacerations. No drainage or tenderness. Tympanic membrane is not injected. Tympanic membrane mobility is normal.  Mouth/Throat: Uvula is midline, oropharynx is clear and moist and mucous membranes are normal.  Neck: Neck supple. No rigidity.  Cardiovascular: Normal rate, regular rhythm and normal heart sounds.   No murmur heard. Pulmonary/Chest: Effort normal and breath sounds normal. No respiratory distress. She has no wheezes.  Musculoskeletal: Normal range of motion. She exhibits no edema.  Lymphadenopathy:       Right cervical: No superficial cervical, no deep cervical and no posterior cervical adenopathy present.      Left cervical: No superficial cervical, no deep cervical and no posterior cervical adenopathy present.  Neurological: She is alert and oriented to person, place, and time.  Nursing note and vitals reviewed.   ED Course  Procedures (including critical care time)  Labs Review Labs Reviewed - No data to display  Imaging Review No results found.   Visual Acuity Review  Right Eye Distance:   Left Eye Distance:   Bilateral Distance:    Right Eye Near:   Left Eye Near:    Bilateral Near:         MDM  No diagnosis found. Bronchitis  Pharyngitis  Patient likely has bronchitis. Since this is lingering on I will treat with Zpak. Prescription given.  Patient reassured her throat exam is benign. Strep unlikely although  she was prescribed Zpak which can cover strep as well. Tussionex prn cough and congestion prescribed. Return precaution discussed.    Kinnie Feil, MD 07/21/15 2037

## 2015-07-21 NOTE — ED Notes (Signed)
PT reports cough, runny nose, and sore throat for 1 week. PT reports cough is productive. No fevers

## 2015-09-06 ENCOUNTER — Ambulatory Visit (HOSPITAL_COMMUNITY)
Admission: EM | Admit: 2015-09-06 | Discharge: 2015-09-06 | Disposition: A | Discharge status: Home / Self Care | Payer: Self-pay | Attending: Family Medicine | Admitting: Family Medicine

## 2015-09-06 ENCOUNTER — Encounter (HOSPITAL_COMMUNITY): Payer: Self-pay | Admitting: Emergency Medicine

## 2015-09-06 DIAGNOSIS — M791 Myalgia: Secondary | ICD-10-CM

## 2015-09-06 DIAGNOSIS — M7918 Myalgia, other site: Secondary | ICD-10-CM

## 2015-09-06 MED ORDER — METHOCARBAMOL 500 MG PO TABS: 500.0000 mg | ORAL_TABLET | Freq: Four times a day (QID) | ORAL | Status: DC | PRN

## 2015-09-06 MED ORDER — DICLOFENAC SODIUM 75 MG PO TBEC: 75.0000 mg | DELAYED_RELEASE_TABLET | Freq: Two times a day (BID) | ORAL | Status: DC

## 2015-09-06 NOTE — Discharge Instructions (Signed)
The cause of your pain is not immediately clear but is likely related to muscle strain. Please use the Voltaren for pain relief. Please use the Robaxin for additional muscle tension relief.

## 2015-09-06 NOTE — ED Provider Notes (Signed)
CSN: [id number]     Arrival date & time 09/06/15  1747 History   First MD Initiated Contact with Patient 09/06/15 1826     Chief Complaint  Patient presents with  . Arm Pain  . Leg Pain   (Consider location/radiation/quality/duration/timing/severity/associated sxs/prior Treatment) HPI  Left arm pain, right leg pain. Started approximately 2 days ago. Started when patient woke up in the morning. Denies any recent change in her exercise routine, or bedding arrangement. Patient states that the pain is fairly constant with waxing and waning nature. Dull ache. Ibuprofen 400 without benefit. Denies any trauma. Denies any numbness or tingling or loss of function. Denies any fevers, rash, chest pain, insect bites, headache,.  Past Medical History  Diagnosis Date  . Uterine fibroid   . Hypertension   . Urinary tract infection   . Fibromyalgia     denies today  . 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    Past Surgical History  Procedure Laterality Date  . Wisdom tooth extraction    . Cesarean section N/A 06/05/2014    Procedure: CESAREAN SECTION;  Surgeon: Truett Mainland, DO;  Location: [facility];  Service: Obstetrics;  Laterality: N/A;   Family History  Problem Relation Age of Onset  . Colon cancer Neg Hx   . Colon polyps Neg Hx   . Kidney disease Neg Hx   . Diabetes Neg Hx   . Esophageal cancer Neg Hx   . Gallbladder disease Neg Hx   . Heart disease Neg Hx   . Asthma Neg Hx   . Cancer Neg Hx   . Stroke Neg Hx    Social History  Substance Use Topics  . Smoking status: Never Smoker   . Smokeless tobacco: Never Used  . Alcohol Use: No     Comment: occasional when not pregnant   OB History    Gravida Para Term Preterm AB TAB SAB Ectopic Multiple Living   1 1  1     1 2      Review of Systems Per HPI with all other pertinent systems negative.   Allergies  Review of patient's allergies indicates no known allergies.  Home Medications   Prior to Admission  medications   Medication Sig Start Date End Date Taking? Authorizing Provider  azithromycin (ZITHROMAX) 250 MG tablet Take two tablet x 1 today and then 1 tablet daily for 4 days. 07/21/15   Kinnie Feil, MD  benzonatate (TESSALON) 200 MG capsule Take 1 capsule (200 mg total) by mouth 3 (three) times daily as needed for cough. 03/04/15   Lezlie Lye, NP  chlorpheniramine-HYDROcodone (TUSSIONEX PENNKINETIC ER) 10-8 MG/5ML SUER Take 5 mLs by mouth every 12 (twelve) hours as needed for cough. 07/21/15   Kinnie Feil, MD  diclofenac (VOLTAREN) 75 MG EC tablet Take 1 tablet (75 mg total) by mouth 2 (two) times daily. 09/06/15   Waldemar Dickens, MD  ibuprofen (ADVIL,MOTRIN) 800 MG tablet Take 1 tablet (800 mg total) by mouth every 8 (eight) hours as needed. 11/02/14   Melony Overly, MD  lansoprazole (PREVACID) 30 MG capsule Take 1 capsule (30 mg total) by mouth 2 (two) times daily before a meal. 1 capsule 30 minutes before breakfast Patient not taking: Reported on 12/02/2014 05/02/14   Woodroe Mode, MD  methocarbamol (ROBAXIN) 500 MG tablet Take 1-2 tablets (500-1,000 mg total) by mouth every 6 (six) hours as needed for muscle spasms. 09/06/15   Waldemar Dickens, MD  norgestimate-ethinyl estradiol (ORTHO-CYCLEN,SPRINTEC,PREVIFEM) 0.25-35 MG-MCG tablet Take 1 tablet by mouth daily. 07/18/14   Mora Bellman, MD  Prenatal Vit-Fe Fumarate-FA (PRENATAL MULTIVITAMIN) TABS tablet Take 1 tablet by mouth daily.     Historical Provider, MD   Meds Ordered and Administered this Visit  Medications - No data to display  BP 118/83 mmHg  Pulse 81  Temp(Src) 98.2 F (36.8 C) (Oral)  Resp 16  SpO2 100%  LMP 08/15/2015  Breastfeeding? No No data found.   Physical Exam Physical Exam  Constitutional: oriented to person, place, and time. appears well-developed and well-nourished. No distress.  HENT:  Head: Normocephalic and atraumatic.  Eyes: EOMI. PERRL.  Neck: Normal range of motion.  Cardiovascular: RRR, 2+  distal pulses,  Pulmonary/Chest: Effort normal normal. No respiratory distress.  Abdominal: Soft. Bowel sounds are normal. NonTTP, no distension.  Musculoskeletal: Normal range of motion.  no effusion.  right knee with tenderness to palpation along the knee joint. Valgus and varus stresses without pain. Lachman's negative. Apprehension test negative. No crepitus. No effusion felt.  Neurological: alert and oriented to person, place, and time.  Skin: Skin is warm. No rash noted. non diaphoretic.  Psychiatric: normal mood and affect. behavior is normal. Judgment and thought content normal.   ED Course  Procedures (including critical care time)  Labs Review Labs Reviewed - No data to display  Imaging Review No results found.   Visual Acuity Review  Right Eye Distance:   Left Eye Distance:   Bilateral Distance:    Right Eye Near:   Left Eye Near:    Bilateral Near:         MDM   1. Musculoskeletal pain    Etiology not immediately clear. Start Robaxin and Voltaren. If worsens go to the emergency room.    Waldemar Dickens, MD 09/06/15 Drema Halon

## 2015-09-06 NOTE — ED Notes (Signed)
Pt c/o left arm pain and right leg pain onset x3 days... Increases w/activity... Denies inj/trauma... A&O x4... NAD

## 2015-10-19 ENCOUNTER — Encounter (HOSPITAL_COMMUNITY): Payer: Self-pay

## 2015-10-19 ENCOUNTER — Inpatient Hospital Stay (HOSPITAL_COMMUNITY): Payer: Self-pay

## 2015-10-19 ENCOUNTER — Inpatient Hospital Stay (HOSPITAL_COMMUNITY)
Admission: AD | Admit: 2015-10-19 | Discharge: 2015-10-19 | Disposition: A | Discharge status: Home / Self Care | Payer: Self-pay | Source: Ambulatory Visit | Attending: Obstetrics and Gynecology | Admitting: Obstetrics and Gynecology

## 2015-10-19 DIAGNOSIS — Z841 Family history of disorders of kidney and ureter: Secondary | ICD-10-CM | POA: Insufficient documentation

## 2015-10-19 DIAGNOSIS — Z8371 Family history of colonic polyps: Secondary | ICD-10-CM | POA: Insufficient documentation

## 2015-10-19 DIAGNOSIS — M797 Fibromyalgia: Secondary | ICD-10-CM | POA: Insufficient documentation

## 2015-10-19 DIAGNOSIS — R109 Unspecified abdominal pain: Secondary | ICD-10-CM

## 2015-10-19 DIAGNOSIS — Z833 Family history of diabetes mellitus: Secondary | ICD-10-CM | POA: Insufficient documentation

## 2015-10-19 DIAGNOSIS — R1031 Right lower quadrant pain: Secondary | ICD-10-CM

## 2015-10-19 DIAGNOSIS — Z823 Family history of stroke: Secondary | ICD-10-CM | POA: Insufficient documentation

## 2015-10-19 DIAGNOSIS — Z809 Family history of malignant neoplasm, unspecified: Secondary | ICD-10-CM | POA: Insufficient documentation

## 2015-10-19 DIAGNOSIS — I10 Essential (primary) hypertension: Secondary | ICD-10-CM | POA: Insufficient documentation

## 2015-10-19 DIAGNOSIS — Z3202 Encounter for pregnancy test, result negative: Secondary | ICD-10-CM | POA: Insufficient documentation

## 2015-10-19 DIAGNOSIS — R102 Pelvic and perineal pain: Secondary | ICD-10-CM | POA: Insufficient documentation

## 2015-10-19 DIAGNOSIS — D259 Leiomyoma of uterus, unspecified: Secondary | ICD-10-CM | POA: Insufficient documentation

## 2015-10-19 DIAGNOSIS — Z8249 Family history of ischemic heart disease and other diseases of the circulatory system: Secondary | ICD-10-CM | POA: Insufficient documentation

## 2015-10-19 DIAGNOSIS — Z87440 Personal history of urinary (tract) infections: Secondary | ICD-10-CM | POA: Insufficient documentation

## 2015-10-19 LAB — CBC
HCT: 37.3 % (ref 36.0–46.0)
Hemoglobin: 12.5 g/dL (ref 12.0–15.0)
MCH: 29.8 pg (ref 26.0–34.0)
MCHC: 33.5 g/dL (ref 30.0–36.0)
MCV: 89 fL (ref 78.0–100.0)
Platelets: 235 10*3/uL (ref 150–400)
RBC: 4.19 MIL/uL (ref 3.87–5.11)
RDW: 14.5 % (ref 11.5–15.5)
WBC: 8.3 10*3/uL (ref 4.0–10.5)

## 2015-10-19 LAB — COMPREHENSIVE METABOLIC PANEL
ALT: 17 U/L (ref 14–54)
AST: 17 U/L (ref 15–41)
Albumin: 4.2 g/dL (ref 3.5–5.0)
Alkaline Phosphatase: 72 U/L (ref 38–126)
Anion gap: 4 — ABNORMAL LOW (ref 5–15)
BUN: 15 mg/dL (ref 6–20)
CO2: 26 mmol/L (ref 22–32)
Calcium: 8.9 mg/dL (ref 8.9–10.3)
Chloride: 106 mmol/L (ref 101–111)
Creatinine, Ser: 0.64 mg/dL (ref 0.44–1.00)
GFR calc Af Amer: >60 mL/min (ref >60)
GFR calc non Af Amer: >60 mL/min (ref >60)
Glucose, Bld: 94 mg/dL (ref 65–99)
Potassium: 3.6 mmol/L (ref 3.5–5.1)
Sodium: 136 mmol/L (ref 135–145)
Total Bilirubin: 0.6 mg/dL (ref 0.3–1.2)
Total Protein: 7.3 g/dL (ref 6.5–8.1)

## 2015-10-19 LAB — WET PREP, GENITAL
Sperm: NONE SEEN
Trich, Wet Prep: NONE SEEN
Yeast Wet Prep HPF POC: NONE SEEN

## 2015-10-19 LAB — URINALYSIS, ROUTINE W REFLEX MICROSCOPIC
Bilirubin Urine: NEGATIVE
Glucose, UA: NEGATIVE mg/dL
Hgb urine dipstick: NEGATIVE
Ketones, ur: 15 mg/dL — AB
Leukocytes, UA: NEGATIVE
Nitrite: NEGATIVE
Protein, ur: NEGATIVE mg/dL
Specific Gravity, Urine: >1.03 — ABNORMAL HIGH (ref 1.005–1.030)
pH: 5.5 (ref 5.0–8.0)

## 2015-10-19 LAB — POCT PREGNANCY, URINE: Preg Test, Ur: NEGATIVE

## 2015-10-19 MED ORDER — KETOROLAC TROMETHAMINE 60 MG/2ML IM SOLN
60.0000 mg | Freq: Once | INTRAMUSCULAR | Status: AC
  Administered 2015-10-19: 60 mg via INTRAMUSCULAR
  Filled 2015-10-19: qty 2

## 2015-10-19 MED ORDER — TRAMADOL HCL 50 MG PO TABS: 50.0000 mg | ORAL_TABLET | Freq: Four times a day (QID) | ORAL | 0 refills | Status: DC | PRN

## 2015-10-19 NOTE — MAU Note (Signed)
Patient presents with right lower abdominal pain which began for 3 days, dysuria, tried taking pan medication and a heating pad did not help.

## 2015-10-19 NOTE — Discharge Instructions (Signed)
Get your prescription filled and use as directed. Be seen at [facility] ER if your pain worsens or if you develop fever, vomiting and diarrhea. Be seen by your primary care doctor for additional evaluation if they pain does not go away.

## 2015-10-19 NOTE — MAU Provider Note (Signed)
History     CSN: [id number]  Arrival date and time: 10/19/15 1132   First Provider Initiated Contact with Patient 10/19/15 1235      Chief Complaint  Patient presents with  . Abdominal Pain   HPI Sherri [name] 30 y.o.  Comes to MAU with RLQ pain that is worsening over the past 3 days.  Has not taken any medication for the pain today.  Has taken Ibuprofen yesterday and tried muscle relaxers also with no relief.  LMP was 10-16-15 and was usual for her.  She is not sexually active at present and does not use any contraception.  Denies any problems with vaginal discharge.  Does not have a doctor that she sees.  Does not have insurance currently. Ate today -- no nausea or vomiting, no diarrhea.  OB History    Gravida Para Term Preterm AB Living   1 1   1   2    SAB TAB Ectopic Multiple Live Births         1 2    Set of twins that are one year of age.  Past Medical History:  Diagnosis Date  . Fibromyalgia    denies today  . Hypertension   . 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 Urinary tract infection   . Uterine fibroid     Past Surgical History:  Procedure Laterality Date  . CESAREAN SECTION N/A 06/05/2014   Procedure: CESAREAN SECTION;  Surgeon: Truett Mainland, DO;  Location: [facility];  Service: Obstetrics;  Laterality: N/A;  . WISDOM TOOTH EXTRACTION      Family History  Problem Relation Age of Onset  . Colon cancer Neg Hx   . Colon polyps Neg Hx   . Kidney disease Neg Hx   . Diabetes Neg Hx   . Esophageal cancer Neg Hx   . Gallbladder disease Neg Hx   . Heart disease Neg Hx   . Asthma Neg Hx   . Cancer Neg Hx   . Stroke Neg Hx     Social History  Substance Use Topics  . Smoking status: Never Smoker  . Smokeless tobacco: Never Used  . Alcohol use No     Comment: occasional when not pregnant    Allergies: No Known Allergies  Prescriptions Prior to Admission  Medication Sig Dispense Refill Last Dose  . methocarbamol (ROBAXIN) 500  MG tablet Take 1-2 tablets (500-1,000 mg total) by mouth every 6 (six) hours as needed for muscle spasms. 60 tablet 0 Past Month at Unknown time  . chlorpheniramine-HYDROcodone (TUSSIONEX PENNKINETIC ER) 10-8 MG/5ML SUER Take 5 mLs by mouth every 12 (twelve) hours as needed for cough. (Patient not taking: Reported on 10/19/2015) 115 mL 0 Not Taking at Unknown time  . diclofenac (VOLTAREN) 75 MG EC tablet Take 1 tablet (75 mg total) by mouth 2 (two) times daily. (Patient not taking: Reported on 10/19/2015) 60 tablet 0 Not Taking at Unknown time    Review of Systems  Constitutional: Negative for fever.  Gastrointestinal: Positive for abdominal pain. Negative for diarrhea, nausea and vomiting.  Genitourinary:       No vaginal discharge. No vaginal bleeding. No dysuria.  Musculoskeletal: Negative for back pain.   Physical Exam   Blood pressure 118/80, pulse 96, temperature 98 F (36.7 C), temperature source Oral, resp. rate 18, height 5\' 3"  (1.6 m), weight 158 lb (71.7 kg), last menstrual period 10/16/2015, not currently breastfeeding.  Physical Exam  Nursing note and  vitals reviewed. Constitutional: She is oriented to person, place, and time. She appears well-developed and well-nourished.  HENT:  Head: Normocephalic.  Eyes: EOM are normal.  Neck: Neck supple.  GI: Soft. Bowel sounds are normal. There is tenderness. There is no rebound and no guarding.  Pain all across RLQ.  No point tenderness.  No radiation.  Genitourinary:  Genitourinary Comments: Speculum exam: Vagina - Small amount of creamy discharge, no odor Cervix - No contact bleeding Bimanual exam: Cervix closed Uterus non tender, normal size Adnexa tender only in RLQ, no masses bilaterally GC/Chlam, wet prep done Chaperone present for exam.  Musculoskeletal: Normal range of motion.  Neurological: She is alert and oriented to person, place, and time.  Skin: Skin is warm and dry.  Psychiatric: She has a normal mood and  affect.    MAU Course  Procedures Results for orders placed or performed during the hospital encounter of 10/19/15 (from the past 24 hour(s))  Urinalysis, Routine w reflex microscopic (not at [facility])     Status: Abnormal   Collection Time: 10/19/15 11:40 AM  Result Value Ref Range   Color, Urine YELLOW YELLOW   APPearance CLEAR CLEAR   Specific Gravity, Urine >1.030 (H) 1.005 - 1.030   pH 5.5 5.0 - 8.0   Glucose, UA NEGATIVE NEGATIVE mg/dL   Hgb urine dipstick NEGATIVE NEGATIVE   Bilirubin Urine NEGATIVE NEGATIVE   Ketones, ur 15 (A) NEGATIVE mg/dL   Protein, ur NEGATIVE NEGATIVE mg/dL   Nitrite NEGATIVE NEGATIVE   Leukocytes, UA NEGATIVE NEGATIVE  Pregnancy, urine POC     Status: None   Collection Time: 10/19/15 11:56 AM  Result Value Ref Range   Preg Test, Ur NEGATIVE NEGATIVE  CBC     Status: None   Collection Time: 10/19/15 12:46 PM  Result Value Ref Range   WBC 8.3 4.0 - 10.5 K/uL   RBC 4.19 3.87 - 5.11 MIL/uL   Hemoglobin 12.5 12.0 - 15.0 g/dL   HCT 37.3 36.0 - 46.0 %   MCV 89.0 78.0 - 100.0 fL   MCH 29.8 26.0 - 34.0 pg   MCHC 33.5 30.0 - 36.0 g/dL   RDW 14.5 11.5 - 15.5 %   Platelets 235 150 - 400 K/uL  Comprehensive metabolic panel     Status: Abnormal   Collection Time: 10/19/15 12:46 PM  Result Value Ref Range   Sodium 136 135 - 145 mmol/L   Potassium 3.6 3.5 - 5.1 mmol/L   Chloride 106 101 - 111 mmol/L   CO2 26 22 - 32 mmol/L   Glucose, Bld 94 65 - 99 mg/dL   BUN 15 6 - 20 mg/dL   Creatinine, Ser 0.64 0.44 - 1.00 mg/dL   Calcium 8.9 8.9 - 10.3 mg/dL   Total Protein 7.3 6.5 - 8.1 g/dL   Albumin 4.2 3.5 - 5.0 g/dL   AST 17 15 - 41 U/L   ALT 17 14 - 54 U/L   Alkaline Phosphatase 72 38 - 126 U/L   Total Bilirubin 0.6 0.3 - 1.2 mg/dL   GFR calc non Af Amer >60 >60 mL/min   GFR calc Af Amer >60 >60 mL/min   Anion gap 4 (L) 5 - 15  Wet prep, genital     Status: Abnormal   Collection Time: 10/19/15  1:15 PM  Result Value Ref Range   Yeast Wet Prep HPF POC  NONE SEEN NONE SEEN   Trich, Wet Prep NONE SEEN NONE SEEN   Clue  Cells Wet Prep HPF POC PRESENT (A) NONE SEEN   WBC, Wet Prep HPF POC FEW (A) NONE SEEN   Sperm NONE SEEN     MDM Gave Toradol 60 MG IM for pain.  Pain lessened but was not completely relieved.  All labs are normal - no indication of infection or other problem.  Client requesting medication for pain. Reviewed preliminary ultrasound result - no reason for pain identified.  Client has previously been diagnosed with fibroids and although she is unsure if this is the pain she usually has with fibroids.  Currently she lifts boxes at work and she has 29 year old twins at home which she lifts periodically.  Unable to lie on right side to sleep due to this pain.   Assessment and Plan  RLQ pain - unknown cause  Plan If pain does not ease over the next couple of days, may need to see PCP for further evaluation. Return to [facility] ER if develops fever, vomiting, and diarrhea as it could be developing an appendicitis. Will eprescribe Ultram to try for pain. Labs pending.  Vianca Bracher L Don Tiu 10/19/2015, 1:19 PM

## 2015-10-20 LAB — HIV ANTIBODY (ROUTINE TESTING W REFLEX): HIV Screen 4th Generation wRfx: NONREACTIVE

## 2015-10-20 LAB — RPR: RPR Ser Ql: NONREACTIVE

## 2015-10-21 LAB — GC/CHLAMYDIA PROBE AMP (~~LOC~~) NOT AT ARMC
Chlamydia: NEGATIVE
Neisseria Gonorrhea: NEGATIVE

## 2015-10-25 ENCOUNTER — Encounter (HOSPITAL_COMMUNITY): Payer: Self-pay | Admitting: *Deleted

## 2015-10-25 ENCOUNTER — Inpatient Hospital Stay (HOSPITAL_COMMUNITY)
Admission: AD | Admit: 2015-10-25 | Discharge: 2015-10-25 | Disposition: A | Discharge status: Home / Self Care | Payer: Self-pay | Source: Ambulatory Visit | Attending: Obstetrics & Gynecology | Admitting: Obstetrics & Gynecology

## 2015-10-25 DIAGNOSIS — M797 Fibromyalgia: Secondary | ICD-10-CM | POA: Insufficient documentation

## 2015-10-25 DIAGNOSIS — M79606 Pain in leg, unspecified: Secondary | ICD-10-CM | POA: Insufficient documentation

## 2015-10-25 DIAGNOSIS — R200 Anesthesia of skin: Secondary | ICD-10-CM | POA: Insufficient documentation

## 2015-10-25 DIAGNOSIS — Z79899 Other long term (current) drug therapy: Secondary | ICD-10-CM | POA: Insufficient documentation

## 2015-10-25 DIAGNOSIS — I10 Essential (primary) hypertension: Secondary | ICD-10-CM | POA: Insufficient documentation

## 2015-10-25 DIAGNOSIS — S300XXA Contusion of lower back and pelvis, initial encounter: Secondary | ICD-10-CM | POA: Insufficient documentation

## 2015-10-25 DIAGNOSIS — Z9889 Other specified postprocedural states: Secondary | ICD-10-CM | POA: Insufficient documentation

## 2015-10-25 DIAGNOSIS — M5432 Sciatica, left side: Secondary | ICD-10-CM | POA: Insufficient documentation

## 2015-10-25 DIAGNOSIS — T148XXA Other injury of unspecified body region, initial encounter: Secondary | ICD-10-CM

## 2015-10-25 DIAGNOSIS — X58XXXA Exposure to other specified factors, initial encounter: Secondary | ICD-10-CM | POA: Insufficient documentation

## 2015-10-25 MED ORDER — IBUPROFEN 800 MG PO TABS: 800.0000 mg | ORAL_TABLET | Freq: Three times a day (TID) | ORAL | 0 refills | Status: DC | PRN

## 2015-10-25 NOTE — MAU Note (Signed)
L buttocks with faint bruising. Pt points to pain runs down posterior portion of L buttocks and down back of L leg

## 2015-10-25 NOTE — MAU Note (Signed)
I was seen here last Sat for abd pain and given shot in L buttocks. Now the area on buttocks is green and hurts down my L leg

## 2015-10-25 NOTE — Progress Notes (Signed)
Colman Cater CNM in earlier to discuss d/c plan. Written and verbal d/c instructions given and understanding voiced.

## 2015-10-25 NOTE — MAU Provider Note (Signed)
History     CSN: [id number]  Arrival date and time: 10/25/15 [id number]   First Provider Initiated Contact with Patient 10/25/15 2011      Chief Complaint  Patient presents with  . Leg Pain   Non-pregnant female c/o green bruise on left buttock x5 days. She also reports intermittent pain and numbness down her left buttock into the back of the thigh. This started 3 days ago. She describes as shooting and worse with sitting and walking. She denies redness or edema in the area. She was seen 6 days ago for abdominal pain and given Toradol IM injection in the left dorsogluteal.     Past Medical History:  Diagnosis Date  . Fibromyalgia    denies today  . Hypertension   . 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 Urinary tract infection   . Uterine fibroid     Past Surgical History:  Procedure Laterality Date  . CESAREAN SECTION N/A 06/05/2014   Procedure: CESAREAN SECTION;  Surgeon: Truett Mainland, DO;  Location: [facility];  Service: Obstetrics;  Laterality: N/A;  . WISDOM TOOTH EXTRACTION      Family History  Problem Relation Age of Onset  . Colon cancer Neg Hx   . Colon polyps Neg Hx   . Kidney disease Neg Hx   . Diabetes Neg Hx   . Esophageal cancer Neg Hx   . Gallbladder disease Neg Hx   . Heart disease Neg Hx   . Asthma Neg Hx   . Cancer Neg Hx   . Stroke Neg Hx     Social History  Substance Use Topics  . Smoking status: Never Smoker  . Smokeless tobacco: Never Used  . Alcohol use No     Comment: occasional when not pregnant    Allergies: No Known Allergies  Prescriptions Prior to Admission  Medication Sig Dispense Refill Last Dose  . traMADol (ULTRAM) 50 MG tablet Take 1 tablet (50 mg total) by mouth every 6 (six) hours as needed. (Patient taking differently: Take 50 mg by mouth every 6 (six) hours as needed for moderate pain. ) 15 tablet 0 10/25/2015 at 0900  . methocarbamol (ROBAXIN) 500 MG tablet Take 1-2 tablets (500-1,000 mg total) by mouth  every 6 (six) hours as needed for muscle spasms. (Patient not taking: Reported on 10/25/2015) 60 tablet 0 Not Taking at Unknown time    Review of Systems  Constitutional: Negative.   Cardiovascular: Negative for leg swelling.  Musculoskeletal: Negative.   Neurological: Positive for tingling and sensory change.  Endo/Heme/Allergies: Bruises/bleeds easily (left buttock).   Physical Exam   Blood pressure 134/75, pulse 83, temperature 98.4 F (36.9 C), resp. rate 18, height 5\' 3"  (1.6 m), weight 72.4 kg (159 lb 9.6 oz), last menstrual period 10/16/2015, SpO2 100 %, not currently breastfeeding.  Physical Exam  Constitutional: She appears well-developed and well-nourished.  HENT:  Head: Normocephalic and atraumatic.  Neck: Normal range of motion.  Cardiovascular: Normal rate.   Respiratory: Effort normal.  Musculoskeletal: Normal range of motion. She exhibits no edema, tenderness or deformity.       Left hip: Normal. She exhibits normal range of motion, normal strength, no tenderness and no swelling.  Skin:      MAU Course  Procedures  MDM No evidence of neuro impairment or DVT. Likely transient sciatica d/t injection and normal bruising pattern. Stable for discharge home.  Assessment and Plan   1. Bruise   2. Sciatica of left  side    Discharge home Ice to bruised area Ibuprofen 800 mg po q8 hrs prn pain/inflammation Continue Ultram prn Follow up with [facility] to establish PCP Return to MAU or ED for emergent needs  Julianne Handler, CNM 10/25/2015, 8:29 PM

## 2015-11-08 ENCOUNTER — Inpatient Hospital Stay (HOSPITAL_COMMUNITY)
Admission: AD | Admit: 2015-11-08 | Discharge: 2015-11-08 | Disposition: A | Discharge status: Home / Self Care | Payer: Self-pay | Source: Ambulatory Visit | Attending: Family Medicine | Admitting: Family Medicine

## 2015-11-08 DIAGNOSIS — M7662 Achilles tendinitis, left leg: Secondary | ICD-10-CM | POA: Insufficient documentation

## 2015-11-08 DIAGNOSIS — M797 Fibromyalgia: Secondary | ICD-10-CM | POA: Insufficient documentation

## 2015-11-08 DIAGNOSIS — M545 Low back pain: Secondary | ICD-10-CM | POA: Insufficient documentation

## 2015-11-08 DIAGNOSIS — M7661 Achilles tendinitis, right leg: Secondary | ICD-10-CM | POA: Insufficient documentation

## 2015-11-08 DIAGNOSIS — Z9889 Other specified postprocedural states: Secondary | ICD-10-CM | POA: Insufficient documentation

## 2015-11-08 DIAGNOSIS — Z79899 Other long term (current) drug therapy: Secondary | ICD-10-CM | POA: Insufficient documentation

## 2015-11-08 LAB — URINALYSIS, ROUTINE W REFLEX MICROSCOPIC
Bilirubin Urine: NEGATIVE
Glucose, UA: NEGATIVE mg/dL
Hgb urine dipstick: NEGATIVE
Ketones, ur: NEGATIVE mg/dL
Leukocytes, UA: NEGATIVE
Nitrite: NEGATIVE
Protein, ur: NEGATIVE mg/dL
Specific Gravity, Urine: 1.01 (ref 1.005–1.030)
pH: 7 (ref 5.0–8.0)

## 2015-11-08 LAB — POCT PREGNANCY, URINE: Preg Test, Ur: NEGATIVE

## 2015-11-08 NOTE — MAU Provider Note (Signed)
History     CSN: [id number]  Arrival date and time: 11/08/15 0949   None     No chief complaint on file.  Non-pregnant female c/o pain in lower back of legs and heels x2 weeks. She has tried heat, ice, and Ibuprofen with little to no effect. Ambulating worsens pain. She denies redness or edema. Denies recent injury. She reports long hours standing on feet (works [employer]). She reports "I came to the ED now because I haven't had time to have it looked at".    Past Medical History:  Diagnosis Date  . Fibromyalgia    denies today  . Hypertension   . 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 Urinary tract infection   . Uterine fibroid     Past Surgical History:  Procedure Laterality Date  . CESAREAN SECTION N/A 06/05/2014   Procedure: CESAREAN SECTION;  Surgeon: Truett Mainland, DO;  Location: [facility];  Service: Obstetrics;  Laterality: N/A;  . WISDOM TOOTH EXTRACTION      Family History  Problem Relation Age of Onset  . Colon cancer Neg Hx   . Colon polyps Neg Hx   . Kidney disease Neg Hx   . Diabetes Neg Hx   . Esophageal cancer Neg Hx   . Gallbladder disease Neg Hx   . Heart disease Neg Hx   . Asthma Neg Hx   . Cancer Neg Hx   . Stroke Neg Hx     Social History  Substance Use Topics  . Smoking status: Never Smoker  . Smokeless tobacco: Never Used  . Alcohol use No     Comment: occasional when not pregnant    Allergies: No Known Allergies  Prescriptions Prior to Admission  Medication Sig Dispense Refill Last Dose  . ibuprofen (ADVIL,MOTRIN) 800 MG tablet Take 1 tablet (800 mg total) by mouth every 8 (eight) hours as needed. 30 tablet 0   . traMADol (ULTRAM) 50 MG tablet Take 1 tablet (50 mg total) by mouth every 6 (six) hours as needed. (Patient taking differently: Take 50 mg by mouth every 6 (six) hours as needed for moderate pain. ) 15 tablet 0 10/25/2015 at 0900    Review of Systems  Cardiovascular: Negative for leg swelling.   Musculoskeletal: Negative for falls.       Bilat lower extremity pain  Neurological: Negative.  Negative for sensory change.   Physical Exam   Blood pressure 138/76, pulse 98, temperature 97.8 F (36.6 C), resp. rate 16, last menstrual period 10/16/2015, not currently breastfeeding.  Physical Exam  Constitutional: She is oriented to person, place, and time. She appears well-developed and well-nourished. No distress.  HENT:  Head: Normocephalic.  Neck: Normal range of motion.  Cardiovascular: Normal rate.   Respiratory: Effort normal.  Musculoskeletal:       Right lower leg: Normal.       Left lower leg: Normal.       Right foot: There is tenderness. There is normal range of motion, no bony tenderness, no swelling, normal capillary refill, no crepitus, no deformity and no laceration.       Left foot: There is tenderness. There is normal range of motion, no bony tenderness, no swelling, normal capillary refill, no crepitus, no deformity and no laceration.       Feet:  +pain with dorsiflexion +pain with rise on tip toes    Neurological: She is alert and oriented to person, place, and time.  Skin:  Skin is warm and dry.  Psychiatric: She has a normal mood and affect.   Results for orders placed or performed during the hospital encounter of 11/08/15 (from the past 24 hour(s))  Urinalysis, Routine w reflex microscopic (not at [facility])     Status: None   Collection Time: 11/08/15 10:00 AM  Result Value Ref Range   Color, Urine YELLOW YELLOW   APPearance CLEAR CLEAR   Specific Gravity, Urine 1.010 1.005 - 1.030   pH 7.0 5.0 - 8.0   Glucose, UA NEGATIVE NEGATIVE mg/dL   Hgb urine dipstick NEGATIVE NEGATIVE   Bilirubin Urine NEGATIVE NEGATIVE   Ketones, ur NEGATIVE NEGATIVE mg/dL   Protein, ur NEGATIVE NEGATIVE mg/dL   Nitrite NEGATIVE NEGATIVE   Leukocytes, UA NEGATIVE NEGATIVE  Pregnancy, urine POC     Status: None   Collection Time: 11/08/15 10:03 AM  Result Value Ref Range    Preg Test, Ur NEGATIVE NEGATIVE    MAU Course  Procedures  MDM No evidence of injury or acute MSK process. Stable for discharge home.   Assessment and Plan   1. Achilles tendinitis of both lower extremities    Discharge home Aleve or Ibuprofen prn (has Rx) Ice Wrap Rest Follow up with PCP of choice-info for [facility] provided Discussed appropriate use of ED; should seek care in oupt setting for non-emergent needs   Julianne Handler, CNM 11/08/2015, 10:25 AM

## 2015-11-08 NOTE — Discharge Instructions (Signed)
Achilles Tendinitis Achilles tendinitis is inflammation of the tough, cord-like band that attaches the lower muscles of your leg to your heel (Achilles tendon). It is usually caused by overusing the tendon and joint involved.  CAUSES Achilles tendinitis can happen because of:  A sudden increase in exercise or activity (such as running).  Doing the same exercises or activities (such as jumping) over and over.  Not warming up calf muscles before exercising.  Exercising in shoes that are worn out or not made for exercise.  Having arthritis or a bone growth on the back of the heel bone. This can rub against the tendon and hurt the tendon. SIGNS AND SYMPTOMS The most common symptoms are:  Pain in the back of the leg, just above the heel. The pain usually gets worse with exercise and better with rest.  Stiffness or soreness in the back of the leg, especially in the morning.  Swelling of the skin over the Achilles tendon.  Trouble standing on tiptoe. Sometimes, an Achilles tendon tears (ruptures). Symptoms of an Achilles tendon rupture can include:  Sudden, severe pain in the back of the leg.  Trouble putting weight on the foot or walking normally. DIAGNOSIS Achilles tendinitis will be diagnosed based on symptoms and a physical examination. An X-ray may be done to check if another condition is causing your symptoms. An MRI may be ordered if your health care provider suspects you may have completely torn your tendon, which is called an Achilles tendon rupture.  TREATMENT  Achilles tendinitis usually gets better over time. It can take weeks to months to heal completely. Treatment focuses on treating the symptoms and helping the injury heal. HOME CARE INSTRUCTIONS   Rest your Achilles tendon and avoid activities that cause pain.  Apply ice to the injured area:  Put ice in a plastic bag.  Place a towel between your skin and the bag.  Leave the ice on for 20 minutes, 2-3 times a  day  Try to avoid using the tendon (other than gentle range of motion) while the tendon is painful. Do not resume use until instructed by your health care provider. Then begin use gradually. Do not increase use to the point of pain. If pain does develop, decrease use and continue the above measures. Gradually increase activities that do not cause discomfort until you achieve normal use.  Do exercises to make your calf muscles stronger and more flexible. Your health care provider or physical therapist can recommend exercises for you to do.  Wrap your ankle with an elastic bandage or other wrap. This can help keep your tendon from moving too much. Your health care provider will show you how to wrap your ankle correctly.  Only take over-the-counter or prescription medicines for pain, discomfort, or fever as directed by your health care provider. SEEK MEDICAL CARE IF:   Your pain and swelling increase or pain is uncontrolled with medicines.  You develop new, unexplained symptoms or your symptoms get worse.  You are unable to move your toes or foot.  You develop warmth and swelling in your foot.  You have an unexplained temperature. MAKE SURE YOU:   Understand these instructions.  Will watch your condition.  Will get help right away if you are not doing well or get worse.   This information is not intended to replace advice given to you by your health care provider. Make sure you discuss any questions you have with your health care provider.   Document Released:   11/26/2004 Document Revised: 03/09/2014 Document Reviewed: 09/28/2012 Elsevier Interactive Patient Education 2016 [employer]vier Inc.  

## 2015-11-08 NOTE — MAU Note (Signed)
Pt presents to MAU with complaints of pain in both of her feet. States that she works at [employer] and has to stand on her feet a lot.

## 2015-11-09 ENCOUNTER — Emergency Department (HOSPITAL_COMMUNITY)
Admission: EM | Admit: 2015-11-09 | Discharge: 2015-11-09 | Disposition: A | Discharge status: Home / Self Care | Payer: Self-pay | Attending: Emergency Medicine | Admitting: Emergency Medicine

## 2015-11-09 ENCOUNTER — Encounter (HOSPITAL_COMMUNITY): Payer: Self-pay | Admitting: Emergency Medicine

## 2015-11-09 DIAGNOSIS — I10 Essential (primary) hypertension: Secondary | ICD-10-CM | POA: Insufficient documentation

## 2015-11-09 DIAGNOSIS — M79671 Pain in right foot: Secondary | ICD-10-CM

## 2015-11-09 DIAGNOSIS — M79672 Pain in left foot: Secondary | ICD-10-CM

## 2015-11-09 DIAGNOSIS — M25571 Pain in right ankle and joints of right foot: Secondary | ICD-10-CM | POA: Insufficient documentation

## 2015-11-09 DIAGNOSIS — M25572 Pain in left ankle and joints of left foot: Secondary | ICD-10-CM | POA: Insufficient documentation

## 2015-11-09 MED ORDER — IBUPROFEN 200 MG PO TABS
600.0000 mg | ORAL_TABLET | Freq: Once | ORAL | Status: AC
  Administered 2015-11-09: 600 mg via ORAL
  Filled 2015-11-09: qty 1

## 2015-11-09 MED ORDER — IBUPROFEN 600 MG PO TABS: 600.0000 mg | ORAL_TABLET | Freq: Three times a day (TID) | ORAL | 0 refills | Status: DC | PRN

## 2015-11-09 MED ORDER — ACETAMINOPHEN 325 MG PO TABS
325.0000 mg | ORAL_TABLET | Freq: Once | ORAL | Status: AC
  Administered 2015-11-09: 325 mg via ORAL
  Filled 2015-11-09: qty 1

## 2015-11-09 NOTE — ED Notes (Signed)
Pt refuses to go over discharge paperwork with nurse

## 2015-11-09 NOTE — ED Triage Notes (Signed)
Pt c/o bil heel pain x's 1 week.  Pt st's she works in [employer] and stands a lot.

## 2015-11-09 NOTE — ED Provider Notes (Signed)
[facility] DEPT Provider Note   CSN: [id number] Arrival date & time: 11/09/15  0130     History   Chief Complaint Chief Complaint  Patient presents with  . Foot Pain    HPI Sherri [name] is a 30 y.o. female.  HPI Patient reports pain in the soles of her bilateral feet over the past several weeks.  She was seen yesterday at the [facility] and discharged home with a prescription for ibuprofen.  She states the ibuprofen does not seem to be improving her symptoms.  She does not have a primary care physician.  She denies numbness or weakness of her lower extremities.  No recent injury or trauma.  Her pain is moderate in severity.   Past Medical History:  Diagnosis Date  . Fibromyalgia    denies today  . Hypertension   . 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 Urinary tract infection   . Uterine fibroid     Patient Active Problem List   Diagnosis Date Noted  . Status post cesarean delivery 06/06/2014  . Uterine fibroid 12/25/2013  . Obesity 12/25/2013    Past Surgical History:  Procedure Laterality Date  . CESAREAN SECTION N/A 06/05/2014   Procedure: CESAREAN SECTION;  Surgeon: Truett Mainland, DO;  Location: [facility];  Service: Obstetrics;  Laterality: N/A;  . WISDOM TOOTH EXTRACTION      OB History    Gravida Para Term Preterm AB Living   1 1   1   2    SAB TAB Ectopic Multiple Live Births         1 2       Home Medications    Prior to Admission medications   Medication Sig Start Date End Date Taking? Authorizing Provider  ibuprofen (ADVIL,MOTRIN) 600 MG tablet Take 1 tablet (600 mg total) by mouth every 8 (eight) hours as needed. 11/09/15   Jola Schmidt, MD  traMADol (ULTRAM) 50 MG tablet Take 1 tablet (50 mg total) by mouth every 6 (six) hours as needed. Patient taking differently: Take 50 mg by mouth every 6 (six) hours as needed for moderate pain.  10/19/15   Virginia Rochester, NP    Family History Family History  Problem  Relation Age of Onset  . Colon cancer Neg Hx   . Colon polyps Neg Hx   . Kidney disease Neg Hx   . Diabetes Neg Hx   . Esophageal cancer Neg Hx   . Gallbladder disease Neg Hx   . Heart disease Neg Hx   . Asthma Neg Hx   . Cancer Neg Hx   . Stroke Neg Hx     Social History Social History  Substance Use Topics  . Smoking status: Never Smoker  . Smokeless tobacco: Never Used  . Alcohol use No     Comment: occasional when not pregnant     Allergies   Review of patient's allergies indicates no known allergies.   Review of Systems Review of Systems  All other systems reviewed and are negative.    Physical Exam Updated Vital Signs BP 132/80   Pulse 68   Temp 98.8 F (37.1 C) (Oral)   Resp 20   Ht 5' (1.524 m)   Wt 158 lb (71.7 kg)   LMP 10/16/2015   SpO2 98%   BMI 30.86 kg/m   Physical Exam  Constitutional: She is oriented to person, place, and time. She appears well-developed and well-nourished.  HENT:  Head:  Normocephalic.  Eyes: EOM are normal.  Neck: Normal range of motion.  Pulmonary/Chest: Effort normal.  Abdominal: She exhibits no distension.  Musculoskeletal:  Normal pulses in her bilateral feet.  No significant swelling of her bilateral feet.  Mild tenderness over the soles of both feet in the plantar regions.  No erythema or warmth.  Neurological: She is alert and oriented to person, place, and time.  Psychiatric: She has a normal mood and affect.  Nursing note and vitals reviewed.    ED Treatments / Results  Labs (all labs ordered are listed, but only abnormal results are displayed) Labs Reviewed - No data to display  EKG  EKG Interpretation None       Radiology No results found.  Procedures Procedures (including critical care time)  Medications Ordered in ED Medications  ibuprofen (ADVIL,MOTRIN) tablet 600 mg (600 mg Oral Given 11/09/15 0442)  acetaminophen (TYLENOL) tablet 325 mg (325 mg Oral Given 11/09/15 0442)     Initial  Impression / Assessment and Plan / ED Course  I have reviewed the triage vital signs and the nursing notes.  Pertinent labs & imaging results that were available during my care of the patient were reviewed by me and considered in my medical decision making (see chart for details).  Clinical Course    Bilateral foot pain.  I suspect there is some degree of plantar fasciitis.  Stretches and symptomatic tools given to the patient to help with her discomfort and pain.  Recommend anti-inflammatories.  A list of resources for low cost clinics given to the patient  Final Clinical Impressions(s) / ED Diagnoses   Final diagnoses:  Bilateral foot pain    New Prescriptions Discharge Medication List as of 11/09/2015  4:39 AM       Jola Schmidt, MD 11/09/15 [phone]

## 2015-12-07 ENCOUNTER — Ambulatory Visit (HOSPITAL_COMMUNITY)
Admission: EM | Admit: 2015-12-07 | Discharge: 2015-12-07 | Disposition: A | Discharge status: Home / Self Care | Payer: Self-pay | Attending: Internal Medicine | Admitting: Internal Medicine

## 2015-12-07 ENCOUNTER — Encounter (HOSPITAL_COMMUNITY): Payer: Self-pay | Admitting: Emergency Medicine

## 2015-12-07 ENCOUNTER — Ambulatory Visit: Payer: Self-pay

## 2015-12-07 ENCOUNTER — Ambulatory Visit (INDEPENDENT_AMBULATORY_CARE_PROVIDER_SITE_OTHER): Payer: Self-pay

## 2015-12-07 DIAGNOSIS — M722 Plantar fascial fibromatosis: Secondary | ICD-10-CM

## 2015-12-07 MED ORDER — PREDNISONE 20 MG PO TABS: 20.0000 mg | ORAL_TABLET | Freq: Every day | ORAL | 0 refills | Status: AC

## 2015-12-07 NOTE — ED Provider Notes (Signed)
CSN: [id number]     Arrival date & time 12/07/15  1158 History   First MD Initiated Contact with Patient 12/07/15 1300     Chief Complaint  Patient presents with  . Foot Pain   (Consider location/radiation/quality/duration/timing/severity/associated sxs/prior Treatment) Sherri [name] is a well-appearing 30 y.o female, presents today for bilateral heel pain present for 2 weeks and is gradually worsening. She denies injury. She works in a [employer] 6 days a week 7 hours per day and she stands almost all day during her shift. She reports the pain is constant. Pain is worst with first step in the morning and is also painful throughout the day. Patient saw a podiatry Dr. Salley Scarlet on 09/22 and was told that she has plantar fascitis. She was given an Right foot xray order but she never got the xray done. She returns today reporting that the heel pain has not improved and is getting worst. She have tried taking ibuprofen, hydrocodone (prescribed by Dr. Sharlet Salina), stretching of the foot with no relief.      Past Medical History:  Diagnosis Date  . Fibromyalgia    denies today  . Hypertension   . 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 Urinary tract infection   . Uterine fibroid    Past Surgical History:  Procedure Laterality Date  . CESAREAN SECTION N/A 06/05/2014   Procedure: CESAREAN SECTION;  Surgeon: Truett Mainland, DO;  Location: [facility];  Service: Obstetrics;  Laterality: N/A;  . WISDOM TOOTH EXTRACTION     Family History  Problem Relation Age of Onset  . Colon cancer Neg Hx   . Colon polyps Neg Hx   . Kidney disease Neg Hx   . Diabetes Neg Hx   . Esophageal cancer Neg Hx   . Gallbladder disease Neg Hx   . Heart disease Neg Hx   . Asthma Neg Hx   . Cancer Neg Hx   . Stroke Neg Hx    Social History  Substance Use Topics  . Smoking status: Never Smoker  . Smokeless tobacco: Never Used  . Alcohol use No     Comment: occasional when not  pregnant   OB History    Gravida Para Term Preterm AB Living   1 1   1   2    SAB TAB Ectopic Multiple Live Births         1 2     Review of Systems  All other systems reviewed and are negative.   Allergies  Review of patient's allergies indicates no known allergies.  Home Medications   Prior to Admission medications   Medication Sig Start Date End Date Taking? Authorizing Provider  ibuprofen (ADVIL,MOTRIN) 600 MG tablet Take 1 tablet (600 mg total) by mouth every 8 (eight) hours as needed. 11/09/15   Jola Schmidt, MD  predniSONE (DELTASONE) 20 MG tablet Take 1 tablet (20 mg total) by mouth daily with breakfast. Take 3 tablets daily from day 1-3, Take 2 tablets from day 4-6, Take 1 tablet from day 7-8. 12/07/15 12/16/15  Barry Dienes, NP  traMADol (ULTRAM) 50 MG tablet Take 1 tablet (50 mg total) by mouth every 6 (six) hours as needed. Patient taking differently: Take 50 mg by mouth every 6 (six) hours as needed for moderate pain.  10/19/15   Virginia Rochester, NP   Meds Ordered and Administered this Visit  Medications - No data to display  BP 135/94 (BP Location: Left Arm)  Pulse 99   Temp 97.9 F (36.6 C) (Oral)   Resp 16   Ht 5\' 3"  (1.6 m)   Wt 150 lb (68 kg)   LMP 12/03/2015   SpO2 100%   BMI 26.57 kg/m  No data found.   Physical Exam  Constitutional: She is oriented to person, place, and time. She appears well-developed and well-nourished.  HENT:  Head: Normocephalic and atraumatic.  Cardiovascular: Normal rate, regular rhythm and normal heart sounds.   Pulmonary/Chest: Effort normal and breath sounds normal. No respiratory distress. She has no wheezes.  Musculoskeletal:  Bilateral heel tender on palpation. No swelling or redness noted. Patient ambulatory but reports being painful to walk.   Neurological: She is alert and oriented to person, place, and time.  Nursing note and vitals reviewed.   Urgent Care Course   Clinical Course    Procedures (including  critical care time)  Labs Review Labs Reviewed - No data to display  Imaging Review Dg Foot Complete Right  Result Date: 12/07/2015 CLINICAL DATA:  Patient states that her right foot has been painful for two weeks, NKI. Pain located more in calcaneous area. EXAM: RIGHT FOOT COMPLETE - 3+ VIEW COMPARISON:  None. FINDINGS: There is no evidence of fracture or dislocation. There is no evidence of arthropathy or other focal bone abnormality. Soft tissues are unremarkable. IMPRESSION: Negative. Electronically Signed   By: Lajean Manes M.D.   On: 12/07/2015 13:28     MDM   1. Plantar fasciitis, bilateral    Xray of the R foot was done and was negative with no spurs. Clinical finding most consistent with Plantar fascitis. Continue to take hydrocodone PRN for pain. I will add prednisone to the treatment regimen. Patient informed to wear well supported shoes with a high arch. Also instructed to freeze two cans of vegetables and after the cans have been frozen, rolled both feet back and forth on the cans. Informed to follow back up with Dr. Sharlet Salina if symptoms persist or does not improve.     Barry Dienes, NP 12/07/15 1747

## 2015-12-07 NOTE — ED Triage Notes (Signed)
Patient has a 2 week history of bilateral heel pain.  No known injury.  No new shoes.  No new job requirements involving increase in standing.  No new activities.  Simply pain in heels

## 2015-12-13 ENCOUNTER — Encounter (HOSPITAL_COMMUNITY): Payer: Self-pay | Admitting: Emergency Medicine

## 2015-12-13 ENCOUNTER — Ambulatory Visit (HOSPITAL_COMMUNITY)
Admission: EM | Admit: 2015-12-13 | Discharge: 2015-12-13 | Disposition: A | Discharge status: Home / Self Care | Payer: Self-pay | Attending: Emergency Medicine | Admitting: Emergency Medicine

## 2015-12-13 DIAGNOSIS — J0110 Acute frontal sinusitis, unspecified: Secondary | ICD-10-CM

## 2015-12-13 MED ORDER — AMOXICILLIN 500 MG PO CAPS: 500.0000 mg | ORAL_CAPSULE | Freq: Two times a day (BID) | ORAL | 0 refills | Status: DC

## 2015-12-13 NOTE — Discharge Instructions (Signed)
Treat with antibiotic, fluids and use of OTC sudafed as directed. Nasal rinses are good too. Ibuprofen 600mg  every 8 hours will help with inflammation and congestion. Feel better

## 2015-12-13 NOTE — ED Triage Notes (Signed)
The patient presented to the [facility] with a complaint of a cough and nasal congestion x 1 week.

## 2015-12-13 NOTE — ED Provider Notes (Signed)
CSN: [id number]     Arrival date & time 12/13/15  1947 History   First MD Initiated Contact with Patient 12/13/15 2148     Chief Complaint  Patient presents with  . Cough   (Consider location/radiation/quality/duration/timing/severity/associated sxs/prior Treatment) 30 yo presents with  Sinus congestion, facial pressure and cough x 8 days without improvement. Now with malaise and subjective fevers. Using OTC remedies without relief.       Past Medical History:  Diagnosis Date  . Fibromyalgia    denies today  . Hypertension   . 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 Urinary tract infection   . Uterine fibroid    Past Surgical History:  Procedure Laterality Date  . CESAREAN SECTION N/A 06/05/2014   Procedure: CESAREAN SECTION;  Surgeon: Truett Mainland, DO;  Location: [facility];  Service: Obstetrics;  Laterality: N/A;  . WISDOM TOOTH EXTRACTION     Family History  Problem Relation Age of Onset  . Colon cancer Neg Hx   . Colon polyps Neg Hx   . Kidney disease Neg Hx   . Diabetes Neg Hx   . Esophageal cancer Neg Hx   . Gallbladder disease Neg Hx   . Heart disease Neg Hx   . Asthma Neg Hx   . Cancer Neg Hx   . Stroke Neg Hx    Social History  Substance Use Topics  . Smoking status: Never Smoker  . Smokeless tobacco: Never Used  . Alcohol use No     Comment: occasional when not pregnant   OB History    Gravida Para Term Preterm AB Living   1 1   1   2    SAB TAB Ectopic Multiple Live Births         1 2     Review of Systems  Constitutional: Positive for fatigue and fever. Negative for chills.  HENT: Positive for congestion and sinus pressure.   Respiratory: Positive for cough.   Skin: Negative.   Psychiatric/Behavioral: Negative.     Allergies  Review of patient's allergies indicates no known allergies.  Home Medications   Prior to Admission medications   Medication Sig Start Date End Date Taking? Authorizing Provider  amoxicillin  (AMOXIL) 500 MG capsule Take 1 capsule (500 mg total) by mouth 2 (two) times daily. 12/13/15   Bjorn Pippin, PA-C  ibuprofen (ADVIL,MOTRIN) 600 MG tablet Take 1 tablet (600 mg total) by mouth every 8 (eight) hours as needed. 11/09/15   Jola Schmidt, MD  predniSONE (DELTASONE) 20 MG tablet Take 1 tablet (20 mg total) by mouth daily with breakfast. Take 3 tablets daily from day 1-3, Take 2 tablets from day 4-6, Take 1 tablet from day 7-8. 12/07/15 12/16/15  Barry Dienes, NP  traMADol (ULTRAM) 50 MG tablet Take 1 tablet (50 mg total) by mouth every 6 (six) hours as needed. Patient taking differently: Take 50 mg by mouth every 6 (six) hours as needed for moderate pain.  10/19/15   Virginia Rochester, NP   Meds Ordered and Administered this Visit  Medications - No data to display  BP 132/76 (BP Location: Left Arm)   Pulse 76   Temp 98.4 F (36.9 C) (Oral)   Resp 12   LMP 12/03/2015 (Exact Date)   SpO2 100%  No data found.   Physical Exam  Constitutional: She is oriented to person, place, and time. She appears well-developed and well-nourished. No distress.  HENT:  Head: Normocephalic and  atraumatic.  Right Ear: External ear normal.  Left Ear: External ear normal.  Mouth/Throat: Oropharynx is clear and moist.  Swelling and erythematous turbinates, pain to percussion of frontal sinus  Pulmonary/Chest: Effort normal and breath sounds normal.  Lymphadenopathy:    She has no cervical adenopathy.  Neurological: She is alert and oriented to person, place, and time.  Skin: Skin is warm and dry. She is not diaphoretic.  Psychiatric: Her behavior is normal.  Nursing note and vitals reviewed.   Urgent Care Course   Clinical Course    Procedures (including critical care time)  Labs Review Labs Reviewed - No data to display  Imaging Review No results found.   Visual Acuity Review  Right Eye Distance:   Left Eye Distance:   Bilateral Distance:    Right Eye Near:   Left Eye Near:     Bilateral Near:         MDM   1. Acute frontal sinusitis, recurrence not specified    Given duration will treat for acute sinusitis with Amox. Symptomatic relief with nasal rinses and sudafed. FU as needed.     Bjorn Pippin, PA-C 12/13/15 2237

## 2015-12-15 ENCOUNTER — Emergency Department (HOSPITAL_COMMUNITY)
Admission: EM | Admit: 2015-12-15 | Discharge: 2015-12-15 | Disposition: A | Discharge status: Home / Self Care | Payer: Self-pay | Attending: Emergency Medicine | Admitting: Emergency Medicine

## 2015-12-15 ENCOUNTER — Encounter (HOSPITAL_COMMUNITY): Payer: Self-pay

## 2015-12-15 ENCOUNTER — Emergency Department (HOSPITAL_COMMUNITY): Payer: Self-pay

## 2015-12-15 DIAGNOSIS — I10 Essential (primary) hypertension: Secondary | ICD-10-CM | POA: Insufficient documentation

## 2015-12-15 DIAGNOSIS — B9789 Other viral agents as the cause of diseases classified elsewhere: Secondary | ICD-10-CM

## 2015-12-15 DIAGNOSIS — Z79899 Other long term (current) drug therapy: Secondary | ICD-10-CM | POA: Insufficient documentation

## 2015-12-15 DIAGNOSIS — J069 Acute upper respiratory infection, unspecified: Secondary | ICD-10-CM | POA: Insufficient documentation

## 2015-12-15 NOTE — Discharge Instructions (Signed)
Use saline nasal spray 1 spray into each nostril every 2 hours while awake. Contact the [facility] in 2 days to arrange to get a primary care physician or see an urgent care center if not feeling better in a week. You can stop the antibiotic amoxicillin as we don't feel that it would be of benefit to you

## 2015-12-15 NOTE — ED Provider Notes (Signed)
[facility] DEPT Provider Note   CSN: [id number] Arrival date & time: 12/15/15  0307     History   Chief Complaint Chief Complaint  Patient presents with  . Cough    HPI Sherri [name] is a 30 y.o. female.  HPI Complains of nasal congestion sneeze and cough for one week. Unknown if she's had fever. No other associated symptoms. Nothing makes symptoms better or worse. Seen at urgent care center 12/13/2015, prescribed amoxicillin which she's taken without relief. She is also taking Claritin without relief. Past Medical History:  Diagnosis Date  . Fibromyalgia    denies today  . Hypertension   . 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 Urinary tract infection   . Uterine fibroid     Patient Active Problem List   Diagnosis Date Noted  . Status post cesarean delivery 06/06/2014  . Uterine fibroid 12/25/2013  . Obesity 12/25/2013    Past Surgical History:  Procedure Laterality Date  . CESAREAN SECTION N/A 06/05/2014   Procedure: CESAREAN SECTION;  Surgeon: Truett Mainland, DO;  Location: [facility];  Service: Obstetrics;  Laterality: N/A;  . WISDOM TOOTH EXTRACTION      OB History    Gravida Para Term Preterm AB Living   1 1   1   2    SAB TAB Ectopic Multiple Live Births         1 2       Home Medications    Prior to Admission medications   Medication Sig Start Date End Date Taking? Authorizing Provider  amoxicillin (AMOXIL) 500 MG capsule Take 1 capsule (500 mg total) by mouth 2 (two) times daily. 12/13/15   Bjorn Pippin, PA-C  ibuprofen (ADVIL,MOTRIN) 600 MG tablet Take 1 tablet (600 mg total) by mouth every 8 (eight) hours as needed. Patient not taking: Reported on 12/15/2015 11/09/15   Jola Schmidt, MD  predniSONE (DELTASONE) 20 MG tablet Take 1 tablet (20 mg total) by mouth daily with breakfast. Take 3 tablets daily from day 1-3, Take 2 tablets from day 4-6, Take 1 tablet from day 7-8. Patient not taking: Reported on 12/15/2015  12/07/15 12/16/15  Barry Dienes, NP  traMADol (ULTRAM) 50 MG tablet Take 1 tablet (50 mg total) by mouth every 6 (six) hours as needed. Patient not taking: Reported on 12/15/2015 10/19/15   Virginia Rochester, NP    Family History Family History  Problem Relation Age of Onset  . Colon cancer Neg Hx   . Colon polyps Neg Hx   . Kidney disease Neg Hx   . Diabetes Neg Hx   . Esophageal cancer Neg Hx   . Gallbladder disease Neg Hx   . Heart disease Neg Hx   . Asthma Neg Hx   . Cancer Neg Hx   . Stroke Neg Hx     Social History Social History  Substance Use Topics  . Smoking status: Never Smoker  . Smokeless tobacco: Never Used  . Alcohol use No     Comment: occasional when not pregnant     Allergies   Review of patient's allergies indicates no known allergies.   Review of Systems Review of Systems  Constitutional: Negative.   HENT: Positive for congestion and sneezing.   Respiratory: Positive for cough.   Cardiovascular: Negative.   Gastrointestinal: Negative.   Musculoskeletal: Negative.   Skin: Negative.   Neurological: Negative.   Psychiatric/Behavioral: Negative.   All other systems reviewed and are negative.  Physical Exam Updated Vital Signs BP 125/83 (BP Location: Left Arm)   Pulse 107   Temp 98.4 F (36.9 C) (Oral)   Resp 18   LMP 12/03/2015 (Exact Date)   SpO2 95%   Physical Exam  Constitutional: She appears well-developed and well-nourished.  HENT:  Head: Normocephalic and atraumatic.  Mouth/Throat: Oropharynx is clear and moist.  Nasal congestion  Eyes: Conjunctivae are normal. Pupils are equal, round, and reactive to light.  Neck: Neck supple. No tracheal deviation present. No thyromegaly present.  Cardiovascular: Normal rate and regular rhythm.   No murmur heard. Heart rate counted at 80 by me  Pulmonary/Chest: Effort normal and breath sounds normal.  Abdominal: Soft. Bowel sounds are normal. She exhibits no distension. There is no  tenderness.  Musculoskeletal: Normal range of motion. She exhibits no edema or tenderness.  Neurological: She is alert. Coordination normal.  Skin: Skin is warm and dry. No rash noted.  Psychiatric: She has a normal mood and affect.  Nursing note and vitals reviewed.    ED Treatments / Results  Labs (all labs ordered are listed, but only abnormal results are displayed) Labs Reviewed - No data to display  EKG  EKG Interpretation None       Radiology Dg Chest 2 View  Result Date: 12/15/2015 CLINICAL DATA:  30 y/o F; 1 week of congestion and productive cough. EXAM: CHEST  2 VIEW COMPARISON:  04/20/2014 chest radiograph FINDINGS: The heart size and mediastinal contours are within normal limits and stable. Both lungs are clear. The visualized skeletal structures are unremarkable. IMPRESSION: No active cardiopulmonary disease. Electronically Signed   By: Kristine Garbe M.D.   On: 12/15/2015 06:57    Procedures Procedures (including critical care time)  Medications Ordered in ED Medications - No data to display  Chest x-ray viewed by me Results for orders placed or performed during the hospital encounter of 11/08/15  Urinalysis, Routine w reflex microscopic (not at [facility])  Result Value Ref Range   Color, Urine YELLOW YELLOW   APPearance CLEAR CLEAR   Specific Gravity, Urine 1.010 1.005 - 1.030   pH 7.0 5.0 - 8.0   Glucose, UA NEGATIVE NEGATIVE mg/dL   Hgb urine dipstick NEGATIVE NEGATIVE   Bilirubin Urine NEGATIVE NEGATIVE   Ketones, ur NEGATIVE NEGATIVE mg/dL   Protein, ur NEGATIVE NEGATIVE mg/dL   Nitrite NEGATIVE NEGATIVE   Leukocytes, UA NEGATIVE NEGATIVE  Pregnancy, urine POC  Result Value Ref Range   Preg Test, Ur NEGATIVE NEGATIVE   Dg Chest 2 View  Result Date: 12/15/2015 CLINICAL DATA:  30 y/o F; 1 week of congestion and productive cough. EXAM: CHEST  2 VIEW COMPARISON:  04/20/2014 chest radiograph FINDINGS: The heart size and mediastinal contours  are within normal limits and stable. Both lungs are clear. The visualized skeletal structures are unremarkable. IMPRESSION: No active cardiopulmonary disease. Electronically Signed   By: Kristine Garbe M.D.   On: 12/15/2015 06:57   Dg Foot Complete Right  Result Date: 12/07/2015 CLINICAL DATA:  Patient states that her right foot has been painful for two weeks, NKI. Pain located more in calcaneous area. EXAM: RIGHT FOOT COMPLETE - 3+ VIEW COMPARISON:  None. FINDINGS: There is no evidence of fracture or dislocation. There is no evidence of arthropathy or other focal bone abnormality. Soft tissues are unremarkable. IMPRESSION: Negative. Electronically Signed   By: Lajean Manes M.D.   On: 12/07/2015 13:28   Initial Impression / Assessment and Plan / ED Course  I  have reviewed the triage vital signs and the nursing notes.  Pertinent labs & imaging results that were available during my care of the patient were reviewed by me and considered in my medical decision making (see chart for details).  Clinical Course    Symptoms consistent with URI. I explained that antibiotics are not necessary. Can DC amoxicillin. Saline nasal spray. Referral [facility] care physician  Final Clinical Impressions(s) / ED Diagnoses  Diagnosis upper respiratory infection Final diagnoses:  Viral URI with cough    New Prescriptions New Prescriptions   No medications on file     Orlie Dakin, MD 12/15/15 [phone]

## 2015-12-15 NOTE — ED Triage Notes (Signed)
Patient c/o cough and congestion over 1 week.  Patient has tried OTC medications without relief.

## 2015-12-15 NOTE — ED Triage Notes (Signed)
Patient denies SOB, patient has chest wall pain from coughing.

## 2015-12-26 ENCOUNTER — Ambulatory Visit (HOSPITAL_COMMUNITY)
Admission: EM | Admit: 2015-12-26 | Discharge: 2015-12-26 | Disposition: A | Discharge status: Home / Self Care | Payer: Self-pay | Attending: Family Medicine | Admitting: Family Medicine

## 2015-12-26 ENCOUNTER — Encounter (HOSPITAL_COMMUNITY): Payer: Self-pay | Admitting: Emergency Medicine

## 2015-12-26 DIAGNOSIS — J019 Acute sinusitis, unspecified: Secondary | ICD-10-CM

## 2015-12-26 DIAGNOSIS — B9689 Other specified bacterial agents as the cause of diseases classified elsewhere: Secondary | ICD-10-CM

## 2015-12-26 MED ORDER — AMOXICILLIN-POT CLAVULANATE 875-125 MG PO TABS: 1.0000 | ORAL_TABLET | Freq: Two times a day (BID) | ORAL | 0 refills | Status: DC

## 2015-12-26 NOTE — ED Provider Notes (Signed)
[facility]    CSN: [id number] Arrival date & time: 12/26/15  1738     History   Chief Complaint Chief Complaint  Patient presents with  . URI   HPI TANGANYIKA CAULDER is a 30 y.o. female presenting for URI symptoms x 2 weeks. She reports sneezing, congestion, runny nose followed by nonproductive cough and fevers that started 2 weeks ago, for which she was seen here and given amoxicillin, claritin with initial improvement though now she's had relapsing fevers, yellow sinus drainage and headache. No dyspnea, chest pain. + nonproductive cough.   Past Medical History:  Diagnosis Date  . Fibromyalgia    denies today  . Hypertension   . 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 Urinary tract infection   . Uterine fibroid     Patient Active Problem List   Diagnosis Date Noted  . Status post cesarean delivery 06/06/2014  . Uterine fibroid 12/25/2013  . Obesity 12/25/2013    Past Surgical History:  Procedure Laterality Date  . CESAREAN SECTION N/A 06/05/2014   Procedure: CESAREAN SECTION;  Surgeon: Truett Mainland, DO;  Location: [facility];  Service: Obstetrics;  Laterality: N/A;  . WISDOM TOOTH EXTRACTION      OB History    Gravida Para Term Preterm AB Living   1 1   1   2    SAB TAB Ectopic Multiple Live Births         1 2       Home Medications    Prior to Admission medications   Medication Sig Start Date End Date Taking? Authorizing Provider  amoxicillin-clavulanate (AUGMENTIN) 875-125 MG tablet Take 1 tablet by mouth every 12 (twelve) hours. 12/26/15   Patrecia Pour, MD    Family History Family History  Problem Relation Age of Onset  . Colon cancer Neg Hx   . Colon polyps Neg Hx   . Kidney disease Neg Hx   . Diabetes Neg Hx   . Esophageal cancer Neg Hx   . Gallbladder disease Neg Hx   . Heart disease Neg Hx   . Asthma Neg Hx   . Cancer Neg Hx   . Stroke Neg Hx     Social History Social History  Substance Use Topics  .  Smoking status: Never Smoker  . Smokeless tobacco: Never Used  . Alcohol use No     Comment: occasional when not pregnant     Allergies   Review of patient's allergies indicates no known allergies.   Review of Systems Review of Systems Per HPI  Physical Exam Triage Vital Signs ED Triage Vitals [12/26/15 1751]  Enc Vitals Group     BP 125/82     Pulse Rate 94     Resp (!) 93     Temp 98.4 F (36.9 C)     Temp Source Oral     SpO2 98 %     Weight      Height      Head Circumference      Peak Flow      Pain Score      Pain Loc      Pain Edu?      Excl. in [place]?    No data found.   Updated Vital Signs BP 125/82 (BP Location: Right Arm)   Pulse 94   Temp 98.4 F (36.9 C) (Oral)   Resp (!) 93   LMP 12/03/2015 (Exact Date)   SpO2 98%  Physical Exam  Constitutional: She is oriented to person, place, and time. She appears well-developed and well-nourished. No distress.  HENT:  Right Ear: External ear normal.  Left Ear: External ear normal.  boggy injected nasal turbinates bilaterally with tenderness to percussion of bilateral frontal sinuses. Erythematous oropharynx without enlarged tonsils or exudates.   Eyes: EOM are normal. Pupils are equal, round, and reactive to light. No scleral icterus.  Neck: Neck supple. No JVD present.  Cardiovascular: Normal rate, regular rhythm, normal heart sounds and intact distal pulses.   No murmur heard. Pulmonary/Chest: Effort normal and breath sounds normal. No respiratory distress.  Abdominal: Soft. Bowel sounds are normal. She exhibits no distension. There is no tenderness.  Musculoskeletal: Normal range of motion. She exhibits no edema or tenderness.  Lymphadenopathy:    She has no cervical adenopathy.  Neurological: She is alert and oriented to person, place, and time. She exhibits normal muscle tone.  Skin: Skin is warm and dry.  Vitals reviewed.    UC Treatments / Results  Labs (all labs ordered are listed, but only  abnormal results are displayed) Labs Reviewed - No data to display  EKG  EKG Interpretation None       Radiology No results found.  Procedures Procedures (including critical care time)  Medications Ordered in UC Medications - No data to display   Initial Impression / Assessment and Plan / UC Course  I have reviewed the triage vital signs and the nursing notes.  Pertinent labs & imaging results that were available during my care of the patient were reviewed by me and considered in my medical decision making (see chart for details).   Final Clinical Impressions(s) / UC Diagnoses   Final diagnoses:  Acute bacterial sinusitis   Immunocompetent 30 yo nonsmoker presenting for symptoms of sinusitis x14 days. Symptoms not completely improved with amoxicillin, purulent drainage with mild fever remains.   Will treat with augmentin x 7 days, follow up if not improving, further return precautions also advised. Symptomatic management also reviewed.   New Prescriptions New Prescriptions   AMOXICILLIN-CLAVULANATE (AUGMENTIN) 875-125 MG TABLET    Take 1 tablet by mouth every 12 (twelve) hours.     Patrecia Pour, MD 12/26/15 [phone]

## 2015-12-26 NOTE — ED Triage Notes (Signed)
C/o cold sx onset 2 weeks associated runny nose, congestion and cough  Denies fevers, chills   A&O x4... NAD

## 2015-12-26 NOTE — Discharge Instructions (Signed)
Take augmentin twice daily for 7 days. If no better, please return for evaluation. Stay hydrated, wash your hands, keep using nasal saline, claritin, and tylenol as needed for fever or pain.

## 2016-01-03 ENCOUNTER — Emergency Department (HOSPITAL_COMMUNITY)
Admission: EM | Admit: 2016-01-03 | Discharge: 2016-01-04 | Disposition: A | Discharge status: Home / Self Care | Payer: Self-pay | Attending: Emergency Medicine | Admitting: Emergency Medicine

## 2016-01-03 ENCOUNTER — Emergency Department (HOSPITAL_COMMUNITY): Payer: Self-pay

## 2016-01-03 ENCOUNTER — Encounter (HOSPITAL_COMMUNITY): Payer: Self-pay | Admitting: Vascular Surgery

## 2016-01-03 DIAGNOSIS — R05 Cough: Secondary | ICD-10-CM

## 2016-01-03 DIAGNOSIS — J069 Acute upper respiratory infection, unspecified: Secondary | ICD-10-CM | POA: Insufficient documentation

## 2016-01-03 DIAGNOSIS — I10 Essential (primary) hypertension: Secondary | ICD-10-CM | POA: Insufficient documentation

## 2016-01-03 DIAGNOSIS — R059 Cough, unspecified: Secondary | ICD-10-CM

## 2016-01-03 DIAGNOSIS — J0100 Acute maxillary sinusitis, unspecified: Secondary | ICD-10-CM | POA: Insufficient documentation

## 2016-01-03 LAB — BASIC METABOLIC PANEL
Anion gap: 7 (ref 5–15)
BUN: 11 mg/dL (ref 6–20)
CO2: 26 mmol/L (ref 22–32)
Calcium: 9.6 mg/dL (ref 8.9–10.3)
Chloride: 106 mmol/L (ref 101–111)
Creatinine, Ser: 0.68 mg/dL (ref 0.44–1.00)
GFR calc Af Amer: >60 mL/min (ref >60)
GFR calc non Af Amer: >60 mL/min (ref >60)
Glucose, Bld: 94 mg/dL (ref 65–99)
Potassium: 3.8 mmol/L (ref 3.5–5.1)
Sodium: 139 mmol/L (ref 135–145)

## 2016-01-03 LAB — CBC
HCT: 37.1 % (ref 36.0–46.0)
Hemoglobin: 12.2 g/dL (ref 12.0–15.0)
MCH: 29.5 pg (ref 26.0–34.0)
MCHC: 32.9 g/dL (ref 30.0–36.0)
MCV: 89.6 fL (ref 78.0–100.0)
Platelets: 274 10*3/uL (ref 150–400)
RBC: 4.14 MIL/uL (ref 3.87–5.11)
RDW: 14.4 % (ref 11.5–15.5)
WBC: 6.2 10*3/uL (ref 4.0–10.5)

## 2016-01-03 MED ORDER — DEXAMETHASONE SODIUM PHOSPHATE 10 MG/ML IJ SOLN
10.0000 mg | Freq: Once | INTRAMUSCULAR | Status: AC
  Administered 2016-01-04: 10 mg via INTRAMUSCULAR
  Filled 2016-01-03: qty 1

## 2016-01-03 MED ORDER — ALBUTEROL SULFATE HFA 108 (90 BASE) MCG/ACT IN AERS
2.0000 | INHALATION_SPRAY | RESPIRATORY_TRACT | Status: DC | PRN
  Administered 2016-01-04: 2 via RESPIRATORY_TRACT
  Filled 2016-01-03: qty 6.7

## 2016-01-03 MED ORDER — AEROCHAMBER PLUS W/MASK MISC
1.0000 | Freq: Once | Status: AC
  Administered 2016-01-04: 1
  Filled 2016-01-03: qty 1

## 2016-01-03 NOTE — ED Provider Notes (Signed)
[facility] DEPT Provider Note   CSN: [id number] Arrival date & time: 01/03/16  2049     History   Chief Complaint Chief Complaint  Patient presents with  . Nasal Congestion  . Cough    HPI Sherri [name] is a 30 y.o. female with a hx of fibromyaliga, Hypertension, UTI  presents to the Emergency Department complaining of gradual, persistent, URI symptoms onset 2-3 weeks ago. Patient reports persistent sneezing, coughing and rhinorrhea. Record review shows that she has been seen at the urgent care twice in the last several weeks. She has been given and completed a course of amoxicillin and Augmentin without significant relief. She reports frustration over her persistent cough. She denies a history of asthma but reports history of wheezing and has used an albuterol inhaler in the past. She denies fevers or chills, nausea or vomiting. She is no chest pain or shortness of breath. No leg swelling. She is not taking any estrogen and has no history of DVT.  She denies palpitations.  Patient reports she is taking Claritin at this time but no other medications. Nothing makes her symptoms better or worse.   The history is provided by the patient and medical records. No language interpreter was used.    Past Medical History:  Diagnosis Date  . Fibromyalgia    denies today  . Hypertension   . 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 Urinary tract infection   . Uterine fibroid     Patient Active Problem List   Diagnosis Date Noted  . Status post cesarean delivery 06/06/2014  . Uterine fibroid 12/25/2013  . Obesity 12/25/2013    Past Surgical History:  Procedure Laterality Date  . CESAREAN SECTION N/A 06/05/2014   Procedure: CESAREAN SECTION;  Surgeon: Truett Mainland, DO;  Location: [facility];  Service: Obstetrics;  Laterality: N/A;  . WISDOM TOOTH EXTRACTION      OB History    Gravida Para Term Preterm AB Living   1 1   1   2    SAB TAB Ectopic Multiple Live  Births         1 2       Home Medications    Prior to Admission medications   Medication Sig Start Date End Date Taking? Authorizing Provider  amoxicillin-clavulanate (AUGMENTIN) 875-125 MG tablet Take 1 tablet by mouth every 12 (twelve) hours. 12/26/15   Patrecia Pour, MD  azithromycin (ZITHROMAX) 250 MG tablet Take 1 tablet (250 mg total) by mouth daily. Take first 2 tablets together, then 1 every day until finished. 01/04/16   Jyrah Blye, PA-C  benzonatate (TESSALON) 100 MG capsule Take 1 capsule (100 mg total) by mouth every 8 (eight) hours. 01/04/16   Annalese Stiner, PA-C  fluticasone (FLONASE) 50 MCG/ACT nasal spray Place 2 sprays into both nostrils daily. 01/04/16   Jarrett Soho Shamera Yarberry, PA-C    Family History Family History  Problem Relation Age of Onset  . Colon cancer Neg Hx   . Colon polyps Neg Hx   . Kidney disease Neg Hx   . Diabetes Neg Hx   . Esophageal cancer Neg Hx   . Gallbladder disease Neg Hx   . Heart disease Neg Hx   . Asthma Neg Hx   . Cancer Neg Hx   . Stroke Neg Hx     Social History Social History  Substance Use Topics  . Smoking status: Never Smoker  . Smokeless tobacco: Never Used  . Alcohol use  No     Comment: occasional when not pregnant     Allergies   Review of patient's allergies indicates no known allergies.   Review of Systems Review of Systems  Constitutional: Negative for chills and fever.  HENT: Positive for congestion, postnasal drip, rhinorrhea, sinus pressure and sneezing.   Eyes: Negative for itching.  Respiratory: Positive for cough. Negative for shortness of breath.   Cardiovascular: Negative for chest pain.  Gastrointestinal: Negative for abdominal pain, nausea and vomiting.  Neurological: Positive for headaches (frontal).  All other systems reviewed and are negative.    Physical Exam Updated Vital Signs BP 132/75 (BP Location: Left Arm)   Pulse 75   Temp 98.3 F (36.8 C) (Oral)   Resp 16   Ht 5\' 3"   (1.6 m)   Wt 68 kg   LMP 12/03/2015 (Exact Date)   SpO2 99%   BMI 26.57 kg/m   Physical Exam  Constitutional: She appears well-developed and well-nourished. No distress.  HENT:  Head: Normocephalic and atraumatic.  Right Ear: Tympanic membrane, external ear and ear canal normal.  Left Ear: Tympanic membrane, external ear and ear canal normal.  Nose: Mucosal edema and rhinorrhea present. No epistaxis. Right sinus exhibits maxillary sinus tenderness. Right sinus exhibits no frontal sinus tenderness. Left sinus exhibits maxillary sinus tenderness. Left sinus exhibits no frontal sinus tenderness.  Mouth/Throat: Uvula is midline and mucous membranes are normal. Mucous membranes are not pale and not cyanotic. No oropharyngeal exudate, posterior oropharyngeal edema, posterior oropharyngeal erythema or tonsillar abscesses.  Mild persistent bilateral maxillary tenderness  Eyes: Conjunctivae are normal. Pupils are equal, round, and reactive to light.  Neck: Normal range of motion and full passive range of motion without pain.  Cardiovascular: Normal rate and intact distal pulses.   Pulmonary/Chest: Effort normal. No stridor. She has decreased breath sounds ( slightly diminished throughout).  Clear and equal breath sounds without focal wheezes, rhonchi, rales, slightly diminished throughout Dry cough  Abdominal: Soft. There is no tenderness.  Musculoskeletal: Normal range of motion.  Lymphadenopathy:    She has no cervical adenopathy.  Neurological: She is alert.  Skin: Skin is warm and dry. No rash noted. She is not diaphoretic.  Psychiatric: She has a normal mood and affect.  Nursing note and vitals reviewed.    ED Treatments / Results  Labs (all labs ordered are listed, but only abnormal results are displayed) [facility]  CBC     Radiology Dg Chest 2 View  Result Date: 01/03/2016 CLINICAL DATA:  Cough congestion and sneezing for 3 weeks. EXAM: CHEST  2  VIEW COMPARISON:  12/15/2015 FINDINGS: The lungs are clear. The pulmonary vasculature is normal. Heart size is normal. Hilar and mediastinal contours are unremarkable. There is no pleural effusion. IMPRESSION: No active cardiopulmonary disease. Electronically Signed   By: Andreas Newport M.D.   On: 01/03/2016 21:18    Procedures Procedures (including critical care time)  Medications Ordered in ED Medications  albuterol (PROVENTIL HFA;VENTOLIN HFA) 108 (90 Base) MCG/ACT inhaler 2 puff (2 puffs Inhalation Given 01/04/16 0016)  aerochamber plus with mask device 1 each (not administered)  dexamethasone (DECADRON) injection 10 mg (10 mg Intramuscular Given 01/04/16 0016)     Initial Impression / Assessment and Plan / ED Course  I have reviewed the triage vital signs and the nursing notes.  Pertinent labs & imaging results that were available during my care of the patient were reviewed by me and considered in my  medical decision making (see chart for details).  Clinical Course  Value Comment By Time  WBC: 6.2 No leukocytosis.  Labs reassuring. Jarrett Soho Adine Heimann, PA-C 11/04 0016  BP: 132/75 Signs stable. Afebrile. Abigail Butts, PA-C 11/04 0016  DG Chest 2 View No pneumonia Abigail Butts, PA-C 11/04 0016    Pt CXR negative for acute infiltrate. Patients symptoms are consistent with URI, likely viral etiology. She has been treated with amoxicillin and augmentin.  She feels her symptoms are not abating fast enough.  She is taking Claritin, but is not using any nasal spray. Will give azithro along with symptomatic treatment.  Likely bronchitis.  Discussed with pt the likelihood of persistent cough with bronchitis.  Verbalizes understanding and is agreeable with plan. Pt is hemodynamically stable & in NAD prior to dc.   Final Clinical Impressions(s) / ED Diagnoses   Final diagnoses:  Upper respiratory tract infection, unspecified type  Acute maxillary sinusitis, recurrence not  specified  Cough    New Prescriptions New Prescriptions   AZITHROMYCIN (ZITHROMAX) 250 MG TABLET    Take 1 tablet (250 mg total) by mouth daily. Take first 2 tablets together, then 1 every day until finished.   BENZONATATE (TESSALON) 100 MG CAPSULE    Take 1 capsule (100 mg total) by mouth every 8 (eight) hours.   FLUTICASONE (FLONASE) 50 MCG/ACT NASAL SPRAY    Place 2 sprays into both nostrils daily.     Jarrett Soho Nakira Litzau, PA-C 01/04/16 0017    Davonna Belling, MD 01/04/16 [phone]

## 2016-01-03 NOTE — ED Notes (Signed)
Called Pt on 2 separate occasions. No reply.

## 2016-01-03 NOTE — ED Triage Notes (Signed)
Pt reports to the ED for eval of continued coughing and sneezing. She reports she was given abx last week and she was compliant with all of these abx but she continues to have nasal congestion, sneezing, and coughing. Pt reports yellow sputum production. Symptom onset 3-4 weeks ago. Denies any N/V/D. Reports occasional chills but denies any fevers.

## 2016-01-04 MED ORDER — AZITHROMYCIN 250 MG PO TABS: 250.0000 mg | ORAL_TABLET | Freq: Every day | ORAL | 0 refills | Status: DC

## 2016-01-04 MED ORDER — FLUTICASONE PROPIONATE 50 MCG/ACT NA SUSP: 2.0000 | Freq: Every day | NASAL | 2 refills | Status: DC

## 2016-01-04 MED ORDER — BENZONATATE 100 MG PO CAPS: 100.0000 mg | ORAL_CAPSULE | Freq: Three times a day (TID) | ORAL | 0 refills | Status: DC

## 2016-01-04 NOTE — Discharge Instructions (Signed)
1. Medications: flonase, albuterol, azithromycin, tessalon, usual home medications 2. Treatment: rest, drink plenty of fluids, take tylenol or ibuprofen for fever control 3. Follow Up: Please followup with your primary doctor in 3 days for discussion of your diagnoses and further evaluation after today's visit; if you do not have a primary care doctor use the resource guide provided to find one; Return to the ER for high fevers, difficulty breathing or other concerning symptoms

## 2016-01-17 ENCOUNTER — Ambulatory Visit (HOSPITAL_COMMUNITY)
Admission: EM | Admit: 2016-01-17 | Discharge: 2016-01-17 | Disposition: A | Discharge status: Home / Self Care | Payer: Self-pay | Attending: Family Medicine | Admitting: Family Medicine

## 2016-01-17 ENCOUNTER — Encounter (HOSPITAL_COMMUNITY): Payer: Self-pay | Admitting: Family Medicine

## 2016-01-17 DIAGNOSIS — R0982 Postnasal drip: Secondary | ICD-10-CM

## 2016-01-17 DIAGNOSIS — K219 Gastro-esophageal reflux disease without esophagitis: Secondary | ICD-10-CM

## 2016-01-17 MED ORDER — FAMOTIDINE 20 MG PO TABS
20.0000 mg | ORAL_TABLET | Freq: Two times a day (BID) | ORAL | 0 refills | Status: DC
Start: 2016-01-17 — End: 2016-06-08

## 2016-01-17 MED ORDER — IPRATROPIUM BROMIDE 0.03 % NA SOLN: 2.0000 | Freq: Two times a day (BID) | NASAL | 0 refills | Status: DC

## 2016-01-17 NOTE — Discharge Instructions (Signed)
Is nice to meet you. I suspect that you had an infection initially but that this has resolved. For the postnasal drip and cough, I have prescribed an Atrovent nasal spray. Also try pepcid as this can be helpful for reflux (sometimes people have reflux and the only symptom they have is cough). I have included the number for [facility], contact them about trying to establish care.

## 2016-01-17 NOTE — ED Provider Notes (Signed)
CSN: [id number]     Arrival date & time 01/17/16  1909 History   None    Chief Complaint  Patient presents with  . Cough   (Consider location/radiation/quality/duration/timing/severity/associated sxs/prior Treatment) HPI  Patient states she has been coughing approximately 4 weeks now. Cough is non-productive. She notes that the cough is most bothersome in the morning. She notes stable cough over the last 4 weeks with no waxing or waning.  She has had nasal congestion x 4 weeks as well.  She had rhinorrhea that resolved with Flonase. She also notes a sore throat that is more prominent in the morning time. She endorses chills, but no fevers. No otalgias, myalgias, shortness of breath, facial pain, chest pain at rest or with exertion (as long as she's not coughing), or change in vision. No recent travel, no LE swelling, not on OCPs, no recent immobility.  No smoke exposure.   From EMR review, Ms. Housman has been seen several times over the last month for cold symptoms. She was seen in the emergency room on October 13 for acute frontal sinusitis was prescribed amoxicillin, advised to do nasal rinses, and Sudafed. She then went to the emergency room on October 15 with continued complaints. At that time a chest x-ray was negative and they continued to stress the use of nasal saline spray. She then followed up at urgent care on October 26, at which time she stated she had improvement in her symptoms with subsequent worsening. At that time, she was prescribed Augmentin for 7 days. She was last seen in the emergency room on November 3 for continued symptoms. At that time she had another chest x-ray that was negative. She was given a Z-Pak, Tessalon, and Flonase.   Past Medical History:  Diagnosis Date  . Fibromyalgia    denies today  . Hypertension   . 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 Urinary tract infection   . Uterine fibroid    Past Surgical History:   Procedure Laterality Date  . CESAREAN SECTION N/A 06/05/2014   Procedure: CESAREAN SECTION;  Surgeon: Truett Mainland, DO;  Location: [facility];  Service: Obstetrics;  Laterality: N/A;  . WISDOM TOOTH EXTRACTION     Family History  Problem Relation Age of Onset  . Colon cancer Neg Hx   . Colon polyps Neg Hx   . Kidney disease Neg Hx   . Diabetes Neg Hx   . Esophageal cancer Neg Hx   . Gallbladder disease Neg Hx   . Heart disease Neg Hx   . Asthma Neg Hx   . Cancer Neg Hx   . Stroke Neg Hx    Social History  Substance Use Topics  . Smoking status: Never Smoker  . Smokeless tobacco: Never Used  . Alcohol use No     Comment: occasional when not pregnant   OB History    Gravida Para Term Preterm AB Living   1 1   1   2    SAB TAB Ectopic Multiple Live Births         1 2     Review of Systems  Constitutional: Positive for chills. Negative for activity change, appetite change, diaphoresis, fatigue and fever.  HENT: Positive for congestion, postnasal drip and sore throat. Negative for drooling, ear pain, facial swelling, nosebleeds, rhinorrhea, sinus pain, sinus pressure, sneezing and trouble swallowing.   Eyes: Negative for photophobia, pain, redness and itching.  Respiratory: Positive for cough. Negative for choking,  chest tightness, shortness of breath, wheezing and stridor.   Cardiovascular: Negative for chest pain, palpitations and leg swelling.  Gastrointestinal: Negative for abdominal pain, diarrhea, nausea and vomiting.  Endocrine: Negative.   Genitourinary: Negative.   Musculoskeletal: Negative for arthralgias and myalgias.  Skin: Negative for rash.  Allergic/Immunologic: Negative.   Neurological: Negative for dizziness, tremors, syncope, speech difficulty, light-headedness and headaches.  Hematological: Negative.   Psychiatric/Behavioral: Negative.     Allergies  Patient has no known allergies.  Home Medications   Prior to Admission medications   Medication Sig  Start Date End Date Taking? Authorizing Provider  benzonatate (TESSALON) 100 MG capsule Take 1 capsule (100 mg total) by mouth every 8 (eight) hours. 01/04/16   Hannah Muthersbaugh, PA-C  famotidine (PEPCID) 20 MG tablet Take 1 tablet (20 mg total) by mouth 2 (two) times daily. 01/17/16   Archie Patten, MD  ipratropium (ATROVENT) 0.03 % nasal spray Place 2 sprays into both nostrils every 12 (twelve) hours. 01/17/16   Archie Patten, MD   Meds Ordered and Administered this Visit  Medications - No data to display  BP 132/77   Pulse 80   Temp 98 F (36.7 C) (Oral)   Resp 18   SpO2 98%  No data found.   Physical Exam  Constitutional: She is oriented to person, place, and time. She appears well-developed and well-nourished. No distress.  HENT:  Head: Normocephalic and atraumatic.  Right Ear: External ear normal.  Left Ear: External ear normal.  Mouth/Throat: No oropharyngeal exudate.  Cobblestoning of the posterior oropharynx. Boggy nasal turbinates.  Eyes: Conjunctivae are normal. Pupils are equal, round, and reactive to light. Right eye exhibits no discharge. Left eye exhibits no discharge. No scleral icterus.  Neck: Normal range of motion. Neck supple.  Cardiovascular: Normal rate, regular rhythm, normal heart sounds and intact distal pulses.  Exam reveals no gallop and no friction rub.   No murmur heard. Pulmonary/Chest: Effort normal and breath sounds normal. No stridor. No respiratory distress. She has no wheezes. She has no rales. She exhibits no tenderness.  Abdominal: Soft. Bowel sounds are normal.  Musculoskeletal: Normal range of motion. She exhibits no edema, tenderness or deformity.  Lymphadenopathy:    She has no cervical adenopathy.  Neurological: She is alert and oriented to person, place, and time. No cranial nerve deficit. Coordination normal.  Skin: Skin is warm. Capillary refill takes less than 2 seconds. No rash noted. She is not diaphoretic.  Psychiatric: She  has a normal mood and affect. Her behavior is normal.    Urgent Care Course   Clinical Course     Procedures (including critical care time)  Labs Review Labs Reviewed - No data to display  Imaging Review No results found.    MDM   1. Gastroesophageal reflux disease without esophagitis   2. Post-nasal drainage    This is a previously healthy 30 y/o presenting with subacute cough x 4-5 weeks. She has had 3 rounds of antibiotics over the last month. Lungs clear on examination. Afebrile and non-toxic on exam. No need for repeat imaging or antibiotics. Most likely etiology is post-nasal drip vs reflux. No risk factors for PE, satting well on RA. Rx for Atrovent nasal spray and pepcid. Discussed return precautions. Provided patient with [facility] so she can establish care.     Archie Patten, MD 01/17/16 2008

## 2016-01-17 NOTE — ED Triage Notes (Signed)
Pt here for cough x 2 weeks. sts seen here 2 weeks ago and abx not working.

## 2016-01-24 ENCOUNTER — Encounter (HOSPITAL_COMMUNITY): Payer: Self-pay | Admitting: *Deleted

## 2016-01-24 ENCOUNTER — Ambulatory Visit (HOSPITAL_COMMUNITY)
Admission: EM | Admit: 2016-01-24 | Discharge: 2016-01-24 | Disposition: A | Discharge status: Home / Self Care | Payer: Self-pay | Attending: Internal Medicine | Admitting: Internal Medicine

## 2016-01-24 DIAGNOSIS — R0981 Nasal congestion: Secondary | ICD-10-CM

## 2016-01-24 DIAGNOSIS — R05 Cough: Secondary | ICD-10-CM

## 2016-01-24 MED ORDER — PREDNISONE 50 MG PO TABS: 50.0000 mg | ORAL_TABLET | Freq: Every day | ORAL | 0 refills | Status: DC

## 2016-01-24 MED ORDER — LORATADINE-PSEUDOEPHEDRINE ER 10-240 MG PO TB24: 1.0000 | ORAL_TABLET | Freq: Every day | ORAL | 0 refills | Status: AC

## 2016-01-24 NOTE — Discharge Instructions (Addendum)
Symptoms today do not suggest active infection, but instead seem consistent with irritation/inflammation of the sinuses.  Prescription for prednisone and for loratidine/pseudoephedrine (claritin-D) were sent to the [facility] at [place].  Recheck for new fever >100.5 or increasing phlegm production/nasal drainage.  Anticipate gradual improvement in cough/congestion over the next week or two. Followup with [facility] to establish care as discussed previously.

## 2016-01-24 NOTE — ED Provider Notes (Signed)
[facility]    CSN: [id number] Arrival date & time: 01/24/16  [place]     History   Chief Complaint Chief Complaint  Patient presents with  . URI    HPI Sherri [name] is a 30 y.o. female. Several visits for cough starting in 12/2015, a couple different abx and symptomatic measures tried.  Initial chest xray negative.  No malaise, no fever at present, just persistent dry cough, worse in am's.  Headache and maxillary sinus pressure.  Sneezing.  No runny nose.  No sore throat.  Loose stools bid x 2 weeks, no blood.  No vomiting, some nausea with eating.  Using atrovent nasal spray.  Menstrual cramps today, using ibuprofen 400mg  bid and heating pad.      HPI  Past Medical History:  Diagnosis Date  . Fibromyalgia    denies today  . Hypertension   . 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 Urinary tract infection   . Uterine fibroid     Patient Active Problem List   Diagnosis Date Noted  . Status post cesarean delivery 06/06/2014  . Uterine fibroid 12/25/2013  . Obesity 12/25/2013    Past Surgical History:  Procedure Laterality Date  . CESAREAN SECTION N/A 06/05/2014   Procedure: CESAREAN SECTION;  Surgeon: Truett Mainland, DO;  Location: [facility];  Service: Obstetrics;  Laterality: N/A;  . WISDOM TOOTH EXTRACTION      OB History    Gravida Para Term Preterm AB Living   1 1   1   2    SAB TAB Ectopic Multiple Live Births         1 2       Home Medications    Prior to Admission medications   Medication Sig Start Date End Date Taking? Authorizing Provider  benzonatate (TESSALON) 100 MG capsule Take 1 capsule (100 mg total) by mouth every 8 (eight) hours. 01/04/16   Hannah Muthersbaugh, PA-C  famotidine (PEPCID) 20 MG tablet Take 1 tablet (20 mg total) by mouth 2 (two) times daily. 01/17/16   Archie Patten, MD  ipratropium (ATROVENT) 0.03 % nasal spray Place 2 sprays into both nostrils every 12 (twelve) hours. 01/17/16   Archie Patten, MD  loratadine-pseudoephedrine (CLARITIN-D 24 HOUR) 10-240 MG 24 hr tablet Take 1 tablet by mouth daily. 01/24/16 02/08/16  Sherlene Shams, MD  predniSONE (DELTASONE) 50 MG tablet Take 1 tablet (50 mg total) by mouth daily. 01/24/16   Sherlene Shams, MD    Family History Family History  Problem Relation Age of Onset  . Colon cancer Neg Hx   . Colon polyps Neg Hx   . Kidney disease Neg Hx   . Diabetes Neg Hx   . Esophageal cancer Neg Hx   . Gallbladder disease Neg Hx   . Heart disease Neg Hx   . Asthma Neg Hx   . Cancer Neg Hx   . Stroke Neg Hx     Social History Social History  Substance Use Topics  . Smoking status: Never Smoker  . Smokeless tobacco: Never Used  . Alcohol use No     Comment: occasional when not pregnant     Allergies   Patient has no known allergies.   Review of Systems Review of Systems  All other systems reviewed and are negative.    Physical Exam Triage Vital Signs ED Triage Vitals [01/24/16 1844]  Enc Vitals Group     BP 132/76  Pulse Rate 78     Resp 18     Temp 98.6 F (37 C)     Temp Source Oral     SpO2 100 %     Weight      Height      Pain Score    Updated Vital Signs BP 132/76 (BP Location: Right Arm)   Pulse 78   Temp 98.6 F (37 C) (Oral)   Resp 18   SpO2 100%  Physical Exam  Constitutional: She is oriented to person, place, and time. No distress.  Alert, nicely groomed  HENT:  Head: Atraumatic.  B TMs quite dull, red-tinged.  Marked nasal congestion.  Throat a little bit red with prominent tonsils.  Eyes:  Conjugate gaze, no eye redness/drainage  Neck: Neck supple.  Cardiovascular: Normal rate and regular rhythm.   Pulmonary/Chest: No respiratory distress. She has no wheezes. She has no rales.  Lungs clear, symmetric breath sounds  Abdominal: She exhibits no distension.  Musculoskeletal: Normal range of motion.  No leg swelling  Neurological: She is alert and oriented to person, place, and time.    Skin: Skin is warm and dry.  No cyanosis  Nursing note and vitals reviewed.    UC Treatments / Results   Procedures Procedures (including critical care time) None today  Final Clinical Impressions(s) / UC Diagnoses   Final diagnoses:  Sinus congestion  Cough with congestion of paranasal sinus   Symptoms today do not suggest active infection, but instead seem consistent with irritation/inflammation of the sinuses.  Prescription for prednisone and for loratidine/pseudoephedrine (claritin-D) were sent to the [facility] at [place].  Recheck for new fever >100.5 or increasing phlegm production/nasal drainage.  Anticipate gradual improvement in cough/congestion over the next week or two. Followup with [facility] to establish care as discussed previously.  New Prescriptions New Prescriptions   LORATADINE-PSEUDOEPHEDRINE (CLARITIN-D 24 HOUR) 10-240 MG 24 HR TABLET    Take 1 tablet by mouth daily.   PREDNISONE (DELTASONE) 50 MG TABLET    Take 1 tablet (50 mg total) by mouth daily.     Sherlene Shams, MD 01/27/16 [phone]

## 2016-01-24 NOTE — ED Triage Notes (Signed)
Symptoms  Of  A  persistant  Cough    Seen  1  Week  Ago  For [facility]cc     For  Similar  Symptoms  And  Prior  To  That in the  Ed        Reports  Some  Nausea   As  Well

## 2016-03-08 ENCOUNTER — Encounter (HOSPITAL_COMMUNITY): Payer: Self-pay | Admitting: Emergency Medicine

## 2016-03-08 ENCOUNTER — Ambulatory Visit (HOSPITAL_COMMUNITY)
Admission: EM | Admit: 2016-03-08 | Discharge: 2016-03-08 | Disposition: A | Discharge status: Home / Self Care | Payer: Self-pay | Attending: Family Medicine | Admitting: Family Medicine

## 2016-03-08 DIAGNOSIS — J0100 Acute maxillary sinusitis, unspecified: Secondary | ICD-10-CM

## 2016-03-08 MED ORDER — DOXYCYCLINE HYCLATE 100 MG PO CAPS: 100.0000 mg | ORAL_CAPSULE | Freq: Two times a day (BID) | ORAL | 0 refills | Status: DC

## 2016-03-08 MED ORDER — IPRATROPIUM BROMIDE 0.06 % NA SOLN: 2.0000 | Freq: Four times a day (QID) | NASAL | 1 refills | Status: DC

## 2016-03-08 NOTE — Discharge Instructions (Signed)
Drink plenty of fluids as discussed, use medicine as prescribed, and mucinex or delsym for cough. Return or see your doctor if further problems °

## 2016-03-08 NOTE — ED Provider Notes (Signed)
[place]    CSN: [id number] Arrival date & time: 03/08/16  1247     History   Chief Complaint Chief Complaint  Patient presents with  . Facial Pain  . URI    HPI Sherri [name] is a 31 y.o. female.   The history is provided by the patient.  URI  Presenting symptoms: congestion, cough, facial pain, rhinorrhea and sore throat   Presenting symptoms: no fever   Severity:  Moderate Onset quality:  Gradual Duration:  2 weeks Progression:  Unchanged Chronicity:  New Relieved by:  None tried Worsened by:  Nothing Ineffective treatments:  None tried Associated symptoms: sinus pain   Associated symptoms: no wheezing   Risk factors: sick contacts     Past Medical History:  Diagnosis Date  . Fibromyalgia    denies today  . Hypertension   . 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 Urinary tract infection   . Uterine fibroid     Patient Active Problem List   Diagnosis Date Noted  . Status post cesarean delivery 06/06/2014  . Uterine fibroid 12/25/2013  . Obesity 12/25/2013    Past Surgical History:  Procedure Laterality Date  . CESAREAN SECTION N/A 06/05/2014   Procedure: CESAREAN SECTION;  Surgeon: Truett Mainland, DO;  Location: [facility];  Service: Obstetrics;  Laterality: N/A;  . WISDOM TOOTH EXTRACTION      OB History    Gravida Para Term Preterm AB Living   1 1   1   2    SAB TAB Ectopic Multiple Live Births         1 2       Home Medications    Prior to Admission medications   Medication Sig Start Date End Date Taking? Authorizing Provider  benzonatate (TESSALON) 100 MG capsule Take 1 capsule (100 mg total) by mouth every 8 (eight) hours. 01/04/16   Hannah Muthersbaugh, PA-C  famotidine (PEPCID) 20 MG tablet Take 1 tablet (20 mg total) by mouth 2 (two) times daily. 01/17/16   Archie Patten, MD  fluticasone (FLONASE) 50 MCG/ACT nasal spray Place 2 sprays into both nostrils daily. 03/10/16   Tobie Poet, DO  ipratropium  (ATROVENT) 0.06 % nasal spray Place 2 sprays into both nostrils 4 (four) times daily. 03/08/16   Billy Fischer, MD  predniSONE (DELTASONE) 50 MG tablet Take 1 tablet (50 mg total) by mouth daily. 01/24/16   Sherlene Shams, MD    Family History Family History  Problem Relation Age of Onset  . Colon cancer Neg Hx   . Colon polyps Neg Hx   . Kidney disease Neg Hx   . Diabetes Neg Hx   . Esophageal cancer Neg Hx   . Gallbladder disease Neg Hx   . Heart disease Neg Hx   . Asthma Neg Hx   . Cancer Neg Hx   . Stroke Neg Hx     Social History Social History  Substance Use Topics  . Smoking status: Never Smoker  . Smokeless tobacco: Never Used  . Alcohol use No     Comment: occasional when not pregnant     Allergies   Patient has no known allergies.   Review of Systems Review of Systems  Constitutional: Negative.  Negative for fever.  HENT: Positive for congestion, postnasal drip, rhinorrhea, sinus pain and sore throat.   Respiratory: Positive for cough. Negative for wheezing.   All other systems reviewed and are negative.  Physical Exam Triage Vital Signs ED Triage Vitals  Enc Vitals Group     BP 03/08/16 1340 132/82     Pulse Rate 03/08/16 1340 94     Resp 03/08/16 1340 16     Temp 03/08/16 1340 99.6 F (37.6 C)     Temp Source 03/08/16 1340 Oral     SpO2 03/08/16 1340 100 %     Weight 03/08/16 1340 150 lb (68 kg)     Height 03/08/16 1340 5\' 3"  (1.6 m)     Head Circumference --      Peak Flow --      Pain Score 03/08/16 1342 9     Pain Loc --      Pain Edu? --      Excl. in [place]? --    No data found.   Updated Vital Signs BP 132/82   Pulse 94   Temp 99.6 F (37.6 C) (Oral)   Resp 16   Ht 5\' 3"  (1.6 m)   Wt 150 lb (68 kg)   LMP 02/08/2016   SpO2 100%   BMI 26.57 kg/m   Visual Acuity Right Eye Distance:   Left Eye Distance:   Bilateral Distance:    Right Eye Near:   Left Eye Near:    Bilateral Near:     Physical Exam  Constitutional: She is  oriented to person, place, and time. She appears well-developed and well-nourished. No distress.  HENT:  Right Ear: External ear normal.  Left Ear: External ear normal.  Nose: Nose normal.  Mouth/Throat: Oropharynx is clear and moist.  Eyes: Pupils are equal, round, and reactive to light.  Neck: Normal range of motion.  Cardiovascular: Normal rate and regular rhythm.   Pulmonary/Chest: Effort normal and breath sounds normal.  Lymphadenopathy:    She has cervical adenopathy.  Neurological: She is alert and oriented to person, place, and time.  Skin: Skin is warm and dry.  Nursing note and vitals reviewed.    UC Treatments / Results  Labs (all labs ordered are listed, but only abnormal results are displayed) Labs Reviewed - No data to display  EKG  EKG Interpretation None       Radiology No results found.  Procedures Procedures (including critical care time)  Medications Ordered in UC Medications - No data to display   Initial Impression / Assessment and Plan / UC Course  I have reviewed the triage vital signs and the nursing notes.  Pertinent labs & imaging results that were available during my care of the patient were reviewed by me and considered in my medical decision making (see chart for details).       Final Clinical Impressions(s) / UC Diagnoses   Final diagnoses:  Acute non-recurrent maxillary sinusitis    New Prescriptions Discharge Medication List as of 03/08/2016  2:34 PM    START taking these medications   Details  ipratropium (ATROVENT) 0.06 % nasal spray Place 2 sprays into both nostrils 4 (four) times daily., Starting Sun 03/08/2016, Print    doxycycline (VIBRAMYCIN) 100 MG capsule Take 1 capsule (100 mg total) by mouth 2 (two) times daily., Starting Sun 03/08/2016, Print         Billy Fischer, MD 03/29/16 1212

## 2016-03-08 NOTE — ED Triage Notes (Signed)
PT reports sinus pain, congestion, drainage, ear pain, productvie cough for 2 weeks.

## 2016-03-10 ENCOUNTER — Emergency Department (HOSPITAL_COMMUNITY)
Admission: EM | Admit: 2016-03-10 | Discharge: 2016-03-10 | Disposition: A | Discharge status: Home / Self Care | Payer: Self-pay | Attending: Emergency Medicine | Admitting: Emergency Medicine

## 2016-03-10 ENCOUNTER — Encounter (HOSPITAL_COMMUNITY): Payer: Self-pay | Admitting: *Deleted

## 2016-03-10 DIAGNOSIS — I10 Essential (primary) hypertension: Secondary | ICD-10-CM | POA: Insufficient documentation

## 2016-03-10 DIAGNOSIS — Z79899 Other long term (current) drug therapy: Secondary | ICD-10-CM | POA: Insufficient documentation

## 2016-03-10 DIAGNOSIS — J0100 Acute maxillary sinusitis, unspecified: Secondary | ICD-10-CM | POA: Insufficient documentation

## 2016-03-10 MED ORDER — CETIRIZINE-PSEUDOEPHEDRINE ER 5-120 MG PO TB12: 1.0000 | ORAL_TABLET | Freq: Every day | ORAL | 0 refills | Status: AC

## 2016-03-10 MED ORDER — FLUTICASONE PROPIONATE 50 MCG/ACT NA SUSP: 2.0000 | Freq: Every day | NASAL | 0 refills | Status: DC

## 2016-03-10 MED ORDER — AMOXICILLIN 500 MG PO CAPS: 500.0000 mg | ORAL_CAPSULE | Freq: Two times a day (BID) | ORAL | 0 refills | Status: AC

## 2016-03-10 NOTE — ED Provider Notes (Signed)
[facility] DEPT Provider Note   CSN: [id number] Arrival date & time: 03/10/16  1819     History   Chief Complaint Chief Complaint  Patient presents with  . URI    HPI Sherri [name] is a 31 y.o. female.  The history is provided by the patient.  URI   This is a new problem. Episode onset: 2wks ago. The problem has not changed since onset.There has been no fever. Associated symptoms include congestion, headaches (sinus headaches), plugged ear sensation, sinus pain and cough. Pertinent negatives include no chest pain, no abdominal pain, no diarrhea, no nausea, no vomiting, no dysuria, no rhinorrhea, no sore throat, no neck pain, no rash and no wheezing. Treatments tried: claritin, OTC meds. The treatment provided no relief.    Past Medical History:  Diagnosis Date  . Fibromyalgia    denies today  . Hypertension   . 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 Urinary tract infection   . Uterine fibroid     Patient Active Problem List   Diagnosis Date Noted  . Status post cesarean delivery 06/06/2014  . Uterine fibroid 12/25/2013  . Obesity 12/25/2013    Past Surgical History:  Procedure Laterality Date  . CESAREAN SECTION N/A 06/05/2014   Procedure: CESAREAN SECTION;  Surgeon: Truett Mainland, DO;  Location: [facility];  Service: Obstetrics;  Laterality: N/A;  . WISDOM TOOTH EXTRACTION      OB History    Gravida Para Term Preterm AB Living   1 1   1   2    SAB TAB Ectopic Multiple Live Births         1 2       Home Medications    Prior to Admission medications   Medication Sig Start Date End Date Taking? Authorizing Provider  benzonatate (TESSALON) 100 MG capsule Take 1 capsule (100 mg total) by mouth every 8 (eight) hours. 01/04/16   Hannah Muthersbaugh, PA-C  doxycycline (VIBRAMYCIN) 100 MG capsule Take 1 capsule (100 mg total) by mouth 2 (two) times daily. 03/08/16   Billy Fischer, MD  famotidine (PEPCID) 20 MG tablet Take 1 tablet (20 mg  total) by mouth 2 (two) times daily. 01/17/16   Archie Patten, MD  ipratropium (ATROVENT) 0.06 % nasal spray Place 2 sprays into both nostrils 4 (four) times daily. 03/08/16   Billy Fischer, MD  predniSONE (DELTASONE) 50 MG tablet Take 1 tablet (50 mg total) by mouth daily. 01/24/16   Sherlene Shams, MD    Family History Family History  Problem Relation Age of Onset  . Colon cancer Neg Hx   . Colon polyps Neg Hx   . Kidney disease Neg Hx   . Diabetes Neg Hx   . Esophageal cancer Neg Hx   . Gallbladder disease Neg Hx   . Heart disease Neg Hx   . Asthma Neg Hx   . Cancer Neg Hx   . Stroke Neg Hx     Social History Social History  Substance Use Topics  . Smoking status: Never Smoker  . Smokeless tobacco: Never Used  . Alcohol use No     Comment: occasional when not pregnant     Allergies   Patient has no known allergies.   Review of Systems Review of Systems  Constitutional: Positive for chills. Negative for fever.  HENT: Positive for congestion and sinus pain. Negative for rhinorrhea and sore throat.   Respiratory: Positive for cough. Negative for  shortness of breath and wheezing.   Cardiovascular: Negative for chest pain.  Gastrointestinal: Negative for abdominal pain, diarrhea, nausea and vomiting.  Genitourinary: Negative for dysuria and urgency.  Musculoskeletal: Negative for neck pain.  Skin: Negative for rash.  Neurological: Positive for headaches (sinus headaches).  All other systems reviewed and are negative.    Physical Exam Updated Vital Signs BP 138/75 (BP Location: Right Arm)   Pulse 115   Temp 98.2 F (36.8 C) (Oral)   Resp 19   Ht 4\' 11"  (1.499 m)   Wt 68 kg   LMP 02/08/2016   SpO2 97%   BMI 30.30 kg/m   Physical Exam  Constitutional: She appears well-developed and well-nourished. No distress.  HENT:  Head: Normocephalic and atraumatic.  Right Ear: Tympanic membrane normal.  Left Ear: Tympanic membrane normal.  Nose: Mucosal edema  present. No rhinorrhea. Right sinus exhibits maxillary sinus tenderness and frontal sinus tenderness. Left sinus exhibits maxillary sinus tenderness and frontal sinus tenderness.  Mouth/Throat: Oropharynx is clear and moist.  Eyes: Conjunctivae are normal. Pupils are equal, round, and reactive to light. No scleral icterus.  Neck: Neck supple.  Cardiovascular: Normal rate, regular rhythm and normal heart sounds.   No murmur heard. Pulmonary/Chest: Effort normal and breath sounds normal. No respiratory distress. She has no wheezes. She has no rales.  Abdominal: Soft. There is no tenderness.  Musculoskeletal: She exhibits no edema.  Lymphadenopathy:    She has no cervical adenopathy.  Neurological: She is alert.  Skin: Skin is warm and dry.  Psychiatric: She has a normal mood and affect.  Nursing note and vitals reviewed.    ED Treatments / Results  Labs (all labs ordered are listed, but only abnormal results are displayed) Labs Reviewed - No data to display  EKG  EKG Interpretation None       Radiology No results found.  Procedures Procedures (including critical care time)  Medications Ordered in ED Medications - No data to display   Initial Impression / Assessment and Plan / ED Course  I have reviewed the triage vital signs and the nursing notes.  Pertinent labs & imaging results that were available during my care of the patient were reviewed by me and considered in my medical decision making (see chart for details).  Clinical Course     Patient is a 31 year old female with history of heart nausea, hypertension who presents with 2 weeks of continued bilateral sinus pain and sinus congestion.  Symptoms consistent with URI. Patient did have sinus tenderness to percussion on exam. Because symptoms have been ongoing for the last 2 weeks we'll go ahead and treat with amoxicillin as well as symptomatic treatment with Flonase and zyrtec-D. Lungs are otherwise clear, no signs  of ear infection or throat infection.   Patient is discharged in good condition and can follow up with a PCP as needed.  Final Clinical Impressions(s) / ED Diagnoses   Final diagnoses:  Subacute maxillary sinusitis    New Prescriptions New Prescriptions   No medications on file     Tobie Poet, DO 03/10/16 [place], MD 03/13/16 [phone]

## 2016-03-10 NOTE — ED Triage Notes (Addendum)
Pt reports a 2 week Hx of URI Sx's with congestion ,ear pain bil. And occasional emesis  one time today . Pt denies diarrhea.

## 2016-03-10 NOTE — ED Notes (Signed)
Pt reports that she has been having some upper respiratory congestion for 2 weeks. Pt does not have a PCP at this time. Pt has attempted to use OTC medicines with no relief.

## 2016-04-24 ENCOUNTER — Ambulatory Visit (HOSPITAL_COMMUNITY)
Admission: EM | Admit: 2016-04-24 | Discharge: 2016-04-24 | Disposition: A | Discharge status: Home / Self Care | Payer: Self-pay | Attending: Family Medicine | Admitting: Family Medicine

## 2016-04-24 ENCOUNTER — Encounter (HOSPITAL_COMMUNITY): Payer: Self-pay | Admitting: Emergency Medicine

## 2016-04-24 DIAGNOSIS — L239 Allergic contact dermatitis, unspecified cause: Secondary | ICD-10-CM

## 2016-04-24 DIAGNOSIS — R21 Rash and other nonspecific skin eruption: Secondary | ICD-10-CM

## 2016-04-24 MED ORDER — TRIAMCINOLONE ACETONIDE 0.1 % EX CREA
1.0000 | TOPICAL_CREAM | Freq: Two times a day (BID) | CUTANEOUS | 0 refills | Status: DC
Start: 2016-04-24 — End: 2016-06-08

## 2016-04-24 MED ORDER — PREDNISONE 50 MG PO TABS: ORAL_TABLET | ORAL | 0 refills | Status: DC

## 2016-04-24 NOTE — ED Provider Notes (Signed)
CSN: [id number]     Arrival date & time 04/24/16  1442 History   First MD Initiated Contact with Patient 04/24/16 [phone]     Chief Complaint  Patient presents with  . Rash   (Consider location/radiation/quality/duration/timing/severity/associated sxs/prior Treatment) 31 year old female presents the urgent care with an itchy rash located to the anterior chest and a near V-shaped pattern. This also effects the anterior neck and a portion of the lower face symmetrically. Unaware of any known contact chemicals, soaps etc.      Past Medical History:  Diagnosis Date  . Fibromyalgia    denies today  . Hypertension   . 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 Urinary tract infection   . Uterine fibroid    Past Surgical History:  Procedure Laterality Date  . CESAREAN SECTION N/A 06/05/2014   Procedure: CESAREAN SECTION;  Surgeon: Truett Mainland, DO;  Location: [facility];  Service: Obstetrics;  Laterality: N/A;  . WISDOM TOOTH EXTRACTION     Family History  Problem Relation Age of Onset  . Colon cancer Neg Hx   . Colon polyps Neg Hx   . Kidney disease Neg Hx   . Diabetes Neg Hx   . Esophageal cancer Neg Hx   . Gallbladder disease Neg Hx   . Heart disease Neg Hx   . Asthma Neg Hx   . Cancer Neg Hx   . Stroke Neg Hx    Social History  Substance Use Topics  . Smoking status: Never Smoker  . Smokeless tobacco: Never Used  . Alcohol use No     Comment: occasional when not pregnant   OB History    Gravida Para Term Preterm AB Living   1 1   1   2    SAB TAB Ectopic Multiple Live Births         1 2     Review of Systems  Constitutional: Negative.   HENT: Negative.   Respiratory: Negative.   Gastrointestinal: Negative.   Skin: Positive for rash.  Neurological: Negative.   All other systems reviewed and are negative.   Allergies  Patient has no known allergies.  Home Medications   Prior to Admission medications   Medication Sig Start Date End Date  Taking? Authorizing Provider  benzonatate (TESSALON) 100 MG capsule Take 1 capsule (100 mg total) by mouth every 8 (eight) hours. 01/04/16   Hannah Muthersbaugh, PA-C  famotidine (PEPCID) 20 MG tablet Take 1 tablet (20 mg total) by mouth 2 (two) times daily. 01/17/16   Archie Patten, MD  fluticasone (FLONASE) 50 MCG/ACT nasal spray Place 2 sprays into both nostrils daily. 03/10/16   Tobie Poet, DO  ipratropium (ATROVENT) 0.06 % nasal spray Place 2 sprays into both nostrils 4 (four) times daily. 03/08/16   Billy Fischer, MD  predniSONE (DELTASONE) 50 MG tablet 1 tab po daily for 6 days. Take with food. 04/24/16   Janne Napoleon, NP  triamcinolone cream (KENALOG) 0.1 % Apply 1 application topically 2 (two) times daily. 04/24/16   Janne Napoleon, NP   Meds Ordered and Administered this Visit  Medications - No data to display  BP 105/74 (BP Location: Left Arm)   Pulse 65   Temp 98.7 F (37.1 C) (Oral)   Resp 16   LMP 04/10/2016   SpO2 100%   Breastfeeding? No  No data found.   Physical Exam  Constitutional: She is oriented to person, place, and time. She appears well-developed and  well-nourished. No distress.  Neck: Normal range of motion. Neck supple.  Cardiovascular: Normal rate.   Pulmonary/Chest: Effort normal.  Neurological: She is alert and oriented to person, place, and time.  Skin: Skin is warm and dry.  Isolated red raised papules to the upper anterior chest and the anterior neck and below the chin. A portion of the lower face on the right is also involved. No sign of infection.  Psychiatric: She has a normal mood and affect.  Nursing note and vitals reviewed.   Urgent Care Course     Procedures (including critical care time)  Labs Review Labs Reviewed - No data to display  Imaging Review No results found.   Visual Acuity Review  Right Eye Distance:   Left Eye Distance:   Bilateral Distance:    Right Eye Near:   Left Eye Near:    Bilateral Near:         MDM   1.  Rash   2. Allergic contact dermatitis, unspecified trigger    Apply the cream twice a day as directed and take the medication with food. For worsening follow-up with your primary care doctor or may return. Meds ordered this encounter  Medications  . triamcinolone cream (KENALOG) 0.1 %    Sig: Apply 1 application topically 2 (two) times daily.    Dispense:  30 g    Refill:  0    Order Specific Question:   Supervising Provider    Answer:   Billy Fischer [phone]  . predniSONE (DELTASONE) 50 MG tablet    Sig: 1 tab po daily for 6 days. Take with food.    Dispense:  6 tablet    Refill:  0    Order Specific Question:   Supervising Provider    Answer:   Billy Fischer [[id number]]       Janne Napoleon, NP 04/24/16 [phone]

## 2016-04-24 NOTE — ED Triage Notes (Signed)
Here for rash on chest onset 1-2 weeks   Denies new foods, meds, hygiene products  A&O x4... NAD

## 2016-04-24 NOTE — Discharge Instructions (Signed)
Apply the cream twice a day as directed and take the medication with food. For worsening follow-up with your primary care doctor or may return.

## 2016-05-14 ENCOUNTER — Ambulatory Visit (HOSPITAL_COMMUNITY)
Admission: EM | Admit: 2016-05-14 | Discharge: 2016-05-14 | Disposition: A | Discharge status: Home / Self Care | Payer: Self-pay | Attending: Internal Medicine | Admitting: Internal Medicine

## 2016-05-14 ENCOUNTER — Encounter (HOSPITAL_COMMUNITY): Payer: Self-pay | Admitting: Emergency Medicine

## 2016-05-14 DIAGNOSIS — B373 Candidiasis of vulva and vagina: Secondary | ICD-10-CM | POA: Insufficient documentation

## 2016-05-14 DIAGNOSIS — X58XXXA Exposure to other specified factors, initial encounter: Secondary | ICD-10-CM | POA: Insufficient documentation

## 2016-05-14 DIAGNOSIS — B3731 Acute candidiasis of vulva and vagina: Secondary | ICD-10-CM

## 2016-05-14 DIAGNOSIS — T700XXA Otitic barotrauma, initial encounter: Secondary | ICD-10-CM | POA: Insufficient documentation

## 2016-05-14 DIAGNOSIS — I10 Essential (primary) hypertension: Secondary | ICD-10-CM | POA: Insufficient documentation

## 2016-05-14 DIAGNOSIS — H6982 Other specified disorders of Eustachian tube, left ear: Secondary | ICD-10-CM | POA: Insufficient documentation

## 2016-05-14 DIAGNOSIS — N898 Other specified noninflammatory disorders of vagina: Secondary | ICD-10-CM | POA: Insufficient documentation

## 2016-05-14 DIAGNOSIS — L739 Follicular disorder, unspecified: Secondary | ICD-10-CM | POA: Insufficient documentation

## 2016-05-14 MED ORDER — METRONIDAZOLE 500 MG PO TABS
500.0000 mg | ORAL_TABLET | Freq: Two times a day (BID) | ORAL | 0 refills | Status: DC
Start: 2016-05-14 — End: 2016-06-08

## 2016-05-14 MED ORDER — CEPHALEXIN 500 MG PO CAPS: 500.0000 mg | ORAL_CAPSULE | Freq: Four times a day (QID) | ORAL | 0 refills | Status: DC

## 2016-05-14 MED ORDER — FLUCONAZOLE 150 MG PO TABS: ORAL_TABLET | ORAL | 0 refills | Status: DC

## 2016-05-14 NOTE — ED Triage Notes (Signed)
Pt complains of left ear pain for four days.  Pt also reports an itchy rash on her chest and neck for the last 4-5 weeks.

## 2016-05-14 NOTE — Discharge Instructions (Signed)
In addition to the prescriptions electronically sent to your pharmacy recommend taking Sudafed PE 10 mg every 4 hours and Allegra or Zyrtec daily to help with the ear pressure and drainage in the back of your throat. This will take a few days to help clear the eustachian tube to the middle ear.

## 2016-05-14 NOTE — ED Provider Notes (Signed)
CSN: [id number]     Arrival date & time 05/14/16  1517 History   First MD Initiated Contact with Patient 05/14/16 1632     Chief Complaint  Patient presents with  . Otalgia    left  . Rash   (Consider location/radiation/quality/duration/timing/severity/associated sxs/prior Treatment) 31 year old female complaining of left earache 3 days. She states her hearing is normal. She also has PND.  Second complaint involves a rash on the anterior chest and around the anterior neck. She was seen for this a few weeks ago and treated with prednisone and topical triamcinolone. She states that she had no improvement and actually felt made it worse. No change.  Third complaint is that of a vaginal discharge with itching.      Past Medical History:  Diagnosis Date  . Fibromyalgia    denies today  . Hypertension   . 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 Urinary tract infection   . Uterine fibroid    Past Surgical History:  Procedure Laterality Date  . CESAREAN SECTION N/A 06/05/2014   Procedure: CESAREAN SECTION;  Surgeon: Truett Mainland, DO;  Location: [facility];  Service: Obstetrics;  Laterality: N/A;  . WISDOM TOOTH EXTRACTION     Family History  Problem Relation Age of Onset  . Colon cancer Neg Hx   . Colon polyps Neg Hx   . Kidney disease Neg Hx   . Diabetes Neg Hx   . Esophageal cancer Neg Hx   . Gallbladder disease Neg Hx   . Heart disease Neg Hx   . Asthma Neg Hx   . Cancer Neg Hx   . Stroke Neg Hx    Social History  Substance Use Topics  . Smoking status: Never Smoker  . Smokeless tobacco: Never Used  . Alcohol use No     Comment: occasional when not pregnant   OB History    Gravida Para Term Preterm AB Living   1 1   1   2    SAB TAB Ectopic Multiple Live Births         1 2     Review of Systems  Constitutional: Negative.   HENT: Positive for ear pain and postnasal drip. Negative for congestion.   Eyes: Negative.   Respiratory: Negative.    Cardiovascular: Negative.   Genitourinary: Positive for vaginal discharge. Negative for dysuria.  Musculoskeletal: Negative.   Skin: Positive for rash.  All other systems reviewed and are negative.   Allergies  Patient has no known allergies.  Home Medications   Prior to Admission medications   Medication Sig Start Date End Date Taking? Authorizing Provider  famotidine (PEPCID) 20 MG tablet Take 1 tablet (20 mg total) by mouth 2 (two) times daily. 01/17/16  Yes Archie Patten, MD  benzonatate (TESSALON) 100 MG capsule Take 1 capsule (100 mg total) by mouth every 8 (eight) hours. 01/04/16   Hannah Muthersbaugh, PA-C  cephALEXin (KEFLEX) 500 MG capsule Take 1 capsule (500 mg total) by mouth 4 (four) times daily. 05/14/16   Janne Napoleon, NP  fluconazole (DIFLUCAN) 150 MG tablet 1 tab po now and repeat in 2 days 05/14/16   Janne Napoleon, NP  fluticasone [facility]) 50 MCG/ACT nasal spray Place 2 sprays into both nostrils daily. 03/10/16   Tobie Poet, DO  ipratropium (ATROVENT) 0.06 % nasal spray Place 2 sprays into both nostrils 4 (four) times daily. 03/08/16   Billy Fischer, MD  metroNIDAZOLE (FLAGYL) 500 MG tablet Take  1 tablet (500 mg total) by mouth 2 (two) times daily. X 7 days 05/14/16   Janne Napoleon, NP  predniSONE (DELTASONE) 50 MG tablet 1 tab po daily for 6 days. Take with food. 04/24/16   Janne Napoleon, NP  triamcinolone cream (KENALOG) 0.1 % Apply 1 application topically 2 (two) times daily. 04/24/16   Janne Napoleon, NP   Meds Ordered and Administered this Visit  Medications - No data to display  BP (!) 105/47 (BP Location: Right Arm)   Pulse 82   Temp 98.4 F (36.9 C) (Oral)   SpO2 97%  No data found.   Physical Exam  Constitutional: She is oriented to person, place, and time. She appears well-developed and well-nourished. No distress.  HENT:  Left TM retracted. Right TM normal. Oropharynx with erythema and clear PND. No exudate.  Eyes: EOM are normal.  Neck: Normal range of motion. Neck  supple.  Cardiovascular: Normal rate.   Pulmonary/Chest: Effort normal.  Genitourinary:  Genitourinary Comments: Normal anatomical female genitalia. The mucosal surface of the labia minora is with erythema and well marginated. Few small white clumps. Skin cottage cheese like discharge covering the vaginal walls and there is a scant amount of thin white to grayish discharge covering the cervix. Cervix is pink. os nulliparous.  No CMT or adnexal tenderness.  Musculoskeletal: She exhibits no edema.  Lymphadenopathy:    She has no cervical adenopathy.  Neurological: She is alert and oriented to person, place, and time.  Skin: Rash noted.  Papular rash along the anterior neck as well as the anterior upper chest, appears a as the last visit.  Psychiatric: She has a normal mood and affect.  Nursing note and vitals reviewed.   Urgent Care Course     Procedures (including critical care time)  Labs Review Labs Reviewed - No data to display  Imaging Review No results found.   Visual Acuity Review  Right Eye Distance:   Left Eye Distance:   Bilateral Distance:    Right Eye Near:   Left Eye Near:    Bilateral Near:         MDM   1. Folliculitis   2. ETD (Eustachian tube dysfunction), left   3. Barotitis media, initial encounter   4. Vaginal discharge   5. Candida vaginitis    Livia present during pelvic exam. In addition to the prescriptions electronically sent to your pharmacy recommend taking Sudafed PE 10 mg every 4 hours and Allegra or Zyrtec daily to help with the ear pressure and drainage in the back of your throat. This will take a few days to help clear the eustachian tube to the middle ear. Meds ordered this encounter  Medications  . cephALEXin (KEFLEX) 500 MG capsule    Sig: Take 1 capsule (500 mg total) by mouth 4 (four) times daily.    Dispense:  28 capsule    Refill:  0    Order Specific Question:   Supervising Provider    Answer:   Sherlene Shams [[id number]]   . fluconazole (DIFLUCAN) 150 MG tablet    Sig: 1 tab po now and repeat in 2 days    Dispense:  2 tablet    Refill:  0    Order Specific Question:   Supervising Provider    Answer:   Sherlene Shams [[id number]]  . metroNIDAZOLE (FLAGYL) 500 MG tablet    Sig: Take 1 tablet (500 mg total) by mouth 2 (two) times daily. X 7 days  Dispense:  14 tablet    Refill:  0    Order Specific Question:   Supervising Provider    Answer:   Sherlene Shams [[id number]]       Janne Napoleon, NP 05/14/16 1731

## 2016-05-16 LAB — CERVICOVAGINAL ANCILLARY ONLY
Chlamydia: NEGATIVE
Neisseria Gonorrhea: NEGATIVE

## 2016-05-18 ENCOUNTER — Encounter (HOSPITAL_COMMUNITY): Payer: Self-pay | Admitting: *Deleted

## 2016-05-18 ENCOUNTER — Ambulatory Visit (HOSPITAL_COMMUNITY)
Admission: EM | Admit: 2016-05-18 | Discharge: 2016-05-18 | Disposition: A | Discharge status: Home / Self Care | Payer: Self-pay | Attending: Family Medicine | Admitting: Family Medicine

## 2016-05-18 DIAGNOSIS — H6502 Acute serous otitis media, left ear: Secondary | ICD-10-CM

## 2016-05-18 LAB — CERVICOVAGINAL ANCILLARY ONLY: Wet Prep (BD Affirm): POSITIVE — AB

## 2016-05-18 NOTE — ED Triage Notes (Signed)
Left ear fullness. No fevers. No nasal congestion.

## 2016-05-18 NOTE — ED Provider Notes (Signed)
CSN: [id number]     Arrival date & time 05/18/16  1239 History   First MD Initiated Contact with Patient 05/18/16 1417     Chief Complaint  Patient presents with  . Otalgia   (Consider location/radiation/quality/duration/timing/severity/associated sxs/prior Treatment) 31 year old female well-known to the urgent care presents today with left ear fullness and decreased hearing. No pain. Started toxoid 4 days ago.      Past Medical History:  Diagnosis Date  . Fibromyalgia    denies today  . Hypertension   . 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 Urinary tract infection   . Uterine fibroid    Past Surgical History:  Procedure Laterality Date  . CESAREAN SECTION N/A 06/05/2014   Procedure: CESAREAN SECTION;  Surgeon: Truett Mainland, DO;  Location: [facility];  Service: Obstetrics;  Laterality: N/A;  . WISDOM TOOTH EXTRACTION     Family History  Problem Relation Age of Onset  . Colon cancer Neg Hx   . Colon polyps Neg Hx   . Kidney disease Neg Hx   . Diabetes Neg Hx   . Esophageal cancer Neg Hx   . Gallbladder disease Neg Hx   . Heart disease Neg Hx   . Asthma Neg Hx   . Cancer Neg Hx   . Stroke Neg Hx    Social History  Substance Use Topics  . Smoking status: Never Smoker  . Smokeless tobacco: Never Used  . Alcohol use No     Comment: occasional when not pregnant   OB History    Gravida Para Term Preterm AB Living   1 1   1   2    SAB TAB Ectopic Multiple Live Births         1 2     Review of Systems  Constitutional: Negative.   HENT: Positive for rhinorrhea. Negative for ear discharge and ear pain.        As per history of present illness  Eyes: Negative.   Gastrointestinal: Negative.   Neurological: Negative.   All other systems reviewed and are negative.   Allergies  Patient has no known allergies.  Home Medications   Prior to Admission medications   Medication Sig Start Date End Date Taking? Authorizing Provider  cephALEXin  (KEFLEX) 500 MG capsule Take 1 capsule (500 mg total) by mouth 4 (four) times daily. 05/14/16  Yes Janne Napoleon, NP  fluconazole (DIFLUCAN) 150 MG tablet 1 tab po now and repeat in 2 days 05/14/16  Yes Janne Napoleon, NP  fluticasone (FLONASE) 50 MCG/ACT nasal spray Place 2 sprays into both nostrils daily. 03/10/16  Yes Tobie Poet, DO  ipratropium (ATROVENT) 0.06 % nasal spray Place 2 sprays into both nostrils 4 (four) times daily. 03/08/16  Yes Billy Fischer, MD  metroNIDAZOLE (FLAGYL) 500 MG tablet Take 1 tablet (500 mg total) by mouth 2 (two) times daily. X 7 days 05/14/16  Yes Janne Napoleon, NP  benzonatate (TESSALON) 100 MG capsule Take 1 capsule (100 mg total) by mouth every 8 (eight) hours. 01/04/16   Hannah Muthersbaugh, PA-C  famotidine (PEPCID) 20 MG tablet Take 1 tablet (20 mg total) by mouth 2 (two) times daily. 01/17/16   Archie Patten, MD  predniSONE (DELTASONE) 50 MG tablet 1 tab po daily for 6 days. Take with food. 04/24/16   Janne Napoleon, NP  triamcinolone cream (KENALOG) 0.1 % Apply 1 application topically 2 (two) times daily. 04/24/16   Janne Napoleon, NP   Meds  Ordered and Administered this Visit  Medications - No data to display  BP (!) 111/53 (BP Location: Right Arm)   Pulse 88   Temp 98.4 F (36.9 C) (Oral)   Resp 17   SpO2 98%  No data found.   Physical Exam  Constitutional: She is oriented to person, place, and time. She appears well-developed and well-nourished. No distress.  HENT:  Head: Normocephalic and atraumatic.  Mouth/Throat: No oropharyngeal exudate.  Right TM normal. Left TM bulging along the inferior aspect. No erythema. No cerumen in the EAC. Oropharynx with minor erythema and scant clear PND.  Eyes: EOM are normal.  Neck: Normal range of motion. Neck supple.  Cardiovascular: Normal rate.   Pulmonary/Chest: Effort normal.  Musculoskeletal: Normal range of motion.  Lymphadenopathy:    She has no cervical adenopathy.  Neurological: She is alert and oriented to person,  place, and time.  Skin: Skin is warm and dry.  Nursing note and vitals reviewed.   Urgent Care Course     Procedures (including critical care time)  Labs Review Labs Reviewed - No data to display  Imaging Review No results found.   Visual Acuity Review  Right Eye Distance:   Left Eye Distance:   Bilateral Distance:    Right Eye Near:   Left Eye Near:    Bilateral Near:         MDM   1. Acute serous otitis media of left ear, recurrence not specified    The fluid in your left ear is causing your fullness feeling and decreased hearing. This generally goes away on its own but it may take several weeks. It is generally not caused by a bacterial infection. Read the instruction sheet on your diagnosis to help understand. Patient is already taking Keflex, prednisone and decongestants with antihistamine.    Janne Napoleon, NP 05/18/16 1435

## 2016-05-18 NOTE — Discharge Instructions (Signed)
The fluid in your left ear is causing your fullness feeling and decreased hearing. This generally goes away on its own but it may take several weeks. It is generally not caused by a bacterial infection. Read the instruction sheet on your diagnosis to help understand.

## 2016-05-25 ENCOUNTER — Emergency Department (HOSPITAL_COMMUNITY)
Admission: EM | Admit: 2016-05-25 | Discharge: 2016-05-25 | Disposition: A | Discharge status: Home / Self Care | Payer: Self-pay | Attending: Emergency Medicine | Admitting: Emergency Medicine

## 2016-05-25 ENCOUNTER — Encounter (HOSPITAL_COMMUNITY): Payer: Self-pay | Admitting: Oncology

## 2016-05-25 DIAGNOSIS — H60312 Diffuse otitis externa, left ear: Secondary | ICD-10-CM

## 2016-05-25 DIAGNOSIS — Z79899 Other long term (current) drug therapy: Secondary | ICD-10-CM | POA: Insufficient documentation

## 2016-05-25 DIAGNOSIS — H60592 Other noninfective acute otitis externa, left ear: Secondary | ICD-10-CM | POA: Insufficient documentation

## 2016-05-25 DIAGNOSIS — I10 Essential (primary) hypertension: Secondary | ICD-10-CM | POA: Insufficient documentation

## 2016-05-25 MED ORDER — CIPROFLOXACIN-DEXAMETHASONE 0.3-0.1 % OT SUSP: 4.0000 [drp] | Freq: Two times a day (BID) | OTIC | 0 refills | Status: AC

## 2016-05-25 NOTE — Discharge Instructions (Signed)
Apply ear drops in left ear as treatment for outer ear infection.

## 2016-05-25 NOTE — ED Provider Notes (Signed)
[facility] DEPT Provider Note   CSN: [id number] Arrival date & time: 05/25/16  6440     History   Chief Complaint Chief Complaint  Patient presents with  . Otalgia    HPI Sherri [name] is a 31 y.o. female.  HPI   31 year old female presenting complaining of left ear pain. The patient reported the past several weeks she has had progressive worsening left ear pain and a dull sensation. Pain worse when she lays on the affected side. Pain is not improved despite him seen at the urgent care several times with this and this was also given Keflex for a skin infection which she does not think help with it. She rates the pain 9 of 10, but denies having any loss of hearing, or drainage coming from a year. No runny nose sneezing or coughing, no fever or headache. She did used to use Q-tip to clean the ear but hasn't done since developing ear pain. Denies history of diabetes. Patient states she is currently not pregnant, last menstrual period was 3/05.  Past Medical History:  Diagnosis Date  . Fibromyalgia    denies today  . Hypertension   . 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 Urinary tract infection   . Uterine fibroid     Patient Active Problem List   Diagnosis Date Noted  . Status post cesarean delivery 06/06/2014  . Uterine fibroid 12/25/2013  . Obesity 12/25/2013    Past Surgical History:  Procedure Laterality Date  . CESAREAN SECTION N/A 06/05/2014   Procedure: CESAREAN SECTION;  Surgeon: Truett Mainland, DO;  Location: [facility];  Service: Obstetrics;  Laterality: N/A;  . WISDOM TOOTH EXTRACTION      OB History    Gravida Para Term Preterm AB Living   1 1   1   2    SAB TAB Ectopic Multiple Live Births         1 2       Home Medications    Prior to Admission medications   Medication Sig Start Date End Date Taking? Authorizing Provider  benzonatate (TESSALON) 100 MG capsule Take 1 capsule (100 mg total) by mouth every 8 (eight) hours.  01/04/16   Hannah Muthersbaugh, PA-C  cephALEXin (KEFLEX) 500 MG capsule Take 1 capsule (500 mg total) by mouth 4 (four) times daily. 05/14/16   Janne Napoleon, NP  famotidine (PEPCID) 20 MG tablet Take 1 tablet (20 mg total) by mouth 2 (two) times daily. 01/17/16   Archie Patten, MD  fluconazole (DIFLUCAN) 150 MG tablet 1 tab po now and repeat in 2 days 05/14/16   Janne Napoleon, NP  fluticasone [facility]) 50 MCG/ACT nasal spray Place 2 sprays into both nostrils daily. 03/10/16   Tobie Poet, DO  ipratropium (ATROVENT) 0.06 % nasal spray Place 2 sprays into both nostrils 4 (four) times daily. 03/08/16   Billy Fischer, MD  metroNIDAZOLE (FLAGYL) 500 MG tablet Take 1 tablet (500 mg total) by mouth 2 (two) times daily. X 7 days 05/14/16   Janne Napoleon, NP  predniSONE (DELTASONE) 50 MG tablet 1 tab po daily for 6 days. Take with food. 04/24/16   Janne Napoleon, NP  triamcinolone cream (KENALOG) 0.1 % Apply 1 application topically 2 (two) times daily. 04/24/16   Janne Napoleon, NP    Family History Family History  Problem Relation Age of Onset  . Colon cancer Neg Hx   . Colon polyps Neg Hx   . Kidney disease  Neg Hx   . Diabetes Neg Hx   . Esophageal cancer Neg Hx   . Gallbladder disease Neg Hx   . Heart disease Neg Hx   . Asthma Neg Hx   . Cancer Neg Hx   . Stroke Neg Hx     Social History Social History  Substance Use Topics  . Smoking status: Never Smoker  . Smokeless tobacco: Never Used  . Alcohol use No     Comment: occasional when not pregnant     Allergies   Patient has no known allergies.   Review of Systems Review of Systems  Constitutional: Negative for fever.  HENT: Positive for ear pain. Negative for ear discharge.   Respiratory: Negative for shortness of breath.   Cardiovascular: Negative for chest pain.  Neurological: Negative for headaches.     Physical Exam Updated Vital Signs BP 134/87 (BP Location: Right Arm)   Pulse 87   Temp 98.6 F (37 C) (Oral)   Resp 18   Ht 5\' 3"  (1.6  m)   Wt 68 kg   LMP 05/04/2016 (Approximate)   SpO2 100%   BMI 26.57 kg/m   Physical Exam  Constitutional: She appears well-developed and well-nourished. No distress.  HENT:  Head: Atraumatic.  Right Ear: External ear normal.  Mouth/Throat: Oropharynx is clear and moist.  L ear: TM and upper ear canal is erythematous.  Tenderness to palpation of the earlobe and tragus.  External ear canal non edematous.    Eyes: Conjunctivae are normal.  Neck: Neck supple.  Cardiovascular: Normal rate and regular rhythm.   Pulmonary/Chest: Effort normal and breath sounds normal.  Neurological: She is alert.  Skin: No rash noted.  Psychiatric: She has a normal mood and affect.  Nursing note and vitals reviewed.    ED Treatments / Results  Labs (all labs ordered are listed, but only abnormal results are displayed) Labs Reviewed - No data to display  EKG  EKG Interpretation None       Radiology No results found.  Procedures Procedures (including critical care time)  Medications Ordered in ED Medications - No data to display   Initial Impression / Assessment and Plan / ED Course  I have reviewed the triage vital signs and the nursing notes.  Pertinent labs & imaging results that were available during my care of the patient were reviewed by me and considered in my medical decision making (see chart for details).     BP 134/87 (BP Location: Right Arm)   Pulse 87   Temp 98.6 F (37 C) (Oral)   Resp 18   Ht 5\' 3"  (1.6 m)   Wt 68 kg   LMP 05/04/2016 (Approximate)   SpO2 100%   BMI 26.57 kg/m    Final Clinical Impressions(s) / ED Diagnoses   Final diagnoses:  Acute diffuse otitis externa of left ear    New Prescriptions New Prescriptions   CIPROFLOXACIN-DEXAMETHASONE (CIPRODEX) OTIC SUSPENSION    Place 4 drops into the left ear 2 (two) times daily.   7:53 AM Pt report 3 weeks of L ear pain. L  Ear lobe manipulation is tender to palpation.  Given the prolonged  duration of sxs and pain with manipulation, pt prescribed ciprodex as treatment for OE.  ENT referral given as needed.     Domenic Moras, PA-C 05/25/16 [id number]    Lacretia Leigh, MD 05/26/16 [phone]

## 2016-05-25 NOTE — ED Triage Notes (Signed)
Pt c/o left ear pain x 3 weeks.  Pt was seen for the same and given antibiotics which she states did not help.  Pt rates the pain 9/10.

## 2016-06-08 ENCOUNTER — Encounter (HOSPITAL_COMMUNITY): Payer: Self-pay | Admitting: Emergency Medicine

## 2016-06-08 ENCOUNTER — Ambulatory Visit (HOSPITAL_COMMUNITY)
Admission: EM | Admit: 2016-06-08 | Discharge: 2016-06-08 | Disposition: A | Discharge status: Home / Self Care | Payer: Self-pay | Attending: Internal Medicine | Admitting: Internal Medicine

## 2016-06-08 DIAGNOSIS — R21 Rash and other nonspecific skin eruption: Secondary | ICD-10-CM

## 2016-06-08 NOTE — Discharge Instructions (Signed)
Recommend calling one of the telephone numbers above to get an appointment with a dermatologist. Have tried steroids and antibiotics without any change. Unsure as to what these lesions are all medicine would treat it. Strongly recommend he tried to obtain a primary care provider.

## 2016-06-08 NOTE — ED Triage Notes (Signed)
The patient presented to the [facility] with a complaint of a rash on her chest x 2 weeks.

## 2016-06-08 NOTE — ED Provider Notes (Signed)
CSN: [id number]     Arrival date & time 06/08/16  1643 History   First MD Initiated Contact with Patient 06/08/16 1812     Chief Complaint  Patient presents with  . Rash   (Consider location/radiation/quality/duration/timing/severity/associated sxs/prior Treatment) This is the fourth visit for this 31 year old female with complaints of a rash to the upper chest and anterior neck and a portion of the anterior face bilaterally. She has been treated with oral and topical corticosteroids as well as antibiotics and there have been no changes. Denies systemic symptoms.      Past Medical History:  Diagnosis Date  . Fibromyalgia    denies today  . Hypertension   . 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 Urinary tract infection   . Uterine fibroid    Past Surgical History:  Procedure Laterality Date  . CESAREAN SECTION N/A 06/05/2014   Procedure: CESAREAN SECTION;  Surgeon: Truett Mainland, DO;  Location: [facility];  Service: Obstetrics;  Laterality: N/A;  . WISDOM TOOTH EXTRACTION     Family History  Problem Relation Age of Onset  . Colon cancer Neg Hx   . Colon polyps Neg Hx   . Kidney disease Neg Hx   . Diabetes Neg Hx   . Esophageal cancer Neg Hx   . Gallbladder disease Neg Hx   . Heart disease Neg Hx   . Asthma Neg Hx   . Cancer Neg Hx   . Stroke Neg Hx    Social History  Substance Use Topics  . Smoking status: Never Smoker  . Smokeless tobacco: Never Used  . Alcohol use No     Comment: occasional when not pregnant   OB History    Gravida Para Term Preterm AB Living   1 1   1   2    SAB TAB Ectopic Multiple Live Births         1 2     Review of Systems  Constitutional: Negative.   Skin: Positive for rash.  All other systems reviewed and are negative.   Allergies  Patient has no known allergies.  Home Medications   Prior to Admission medications   Not on File   Meds Ordered and Administered this Visit  Medications - No data to  display  BP 126/79 (BP Location: Right Arm)   Pulse 81   Temp 97.8 F (36.6 C) (Oral)   Resp 16   SpO2 98%  No data found.   Physical Exam  Constitutional: She appears well-developed and well-nourished.  Neck: Neck supple.  Cardiovascular: Normal rate.   Pulmonary/Chest: Effort normal.  Neurological: She is alert.  Skin: Skin is warm and dry.  Unchanged isolated papules across the upper chest and involving the submental and upper anterior neck and portions of the face.  Psychiatric: She has a normal mood and affect.  Nursing note and vitals reviewed.   Urgent Care Course     Procedures (including critical care time)  Labs Review Labs Reviewed - No data to display  Imaging Review No results found.   Visual Acuity Review  Right Eye Distance:   Left Eye Distance:   Bilateral Distance:    Right Eye Near:   Left Eye Near:    Bilateral Near:         MDM   1. Rash    Recommend calling one of the telephone numbers above to get an appointment with a dermatologist. Have tried steroids and antibiotics without any  change. Unsure as to what these lesions are all medicine would treat it. Strongly recommend he tried to obtain a primary care provider.     Janne Napoleon, NP 06/08/16 [phone]

## 2016-06-14 ENCOUNTER — Encounter (HOSPITAL_COMMUNITY): Payer: Self-pay | Admitting: Emergency Medicine

## 2016-06-14 ENCOUNTER — Emergency Department (HOSPITAL_COMMUNITY)
Admission: EM | Admit: 2016-06-14 | Discharge: 2016-06-14 | Disposition: A | Discharge status: Home / Self Care | Payer: Self-pay | Attending: Emergency Medicine | Admitting: Emergency Medicine

## 2016-06-14 DIAGNOSIS — A6004 Herpesviral vulvovaginitis: Secondary | ICD-10-CM | POA: Insufficient documentation

## 2016-06-14 DIAGNOSIS — B9689 Other specified bacterial agents as the cause of diseases classified elsewhere: Secondary | ICD-10-CM | POA: Insufficient documentation

## 2016-06-14 DIAGNOSIS — I10 Essential (primary) hypertension: Secondary | ICD-10-CM | POA: Insufficient documentation

## 2016-06-14 DIAGNOSIS — Z79899 Other long term (current) drug therapy: Secondary | ICD-10-CM | POA: Insufficient documentation

## 2016-06-14 DIAGNOSIS — N760 Acute vaginitis: Secondary | ICD-10-CM | POA: Insufficient documentation

## 2016-06-14 LAB — URINALYSIS, ROUTINE W REFLEX MICROSCOPIC
Bilirubin Urine: NEGATIVE
Glucose, UA: NEGATIVE mg/dL
Hgb urine dipstick: NEGATIVE
Ketones, ur: NEGATIVE mg/dL
Leukocytes, UA: NEGATIVE
Nitrite: NEGATIVE
Protein, ur: NEGATIVE mg/dL
Specific Gravity, Urine: 1.019 (ref 1.005–1.030)
pH: 5 (ref 5.0–8.0)

## 2016-06-14 LAB — WET PREP, GENITAL
Sperm: NONE SEEN
Trich, Wet Prep: NONE SEEN
Yeast Wet Prep HPF POC: NONE SEEN

## 2016-06-14 LAB — PREGNANCY, URINE: Preg Test, Ur: NEGATIVE

## 2016-06-14 MED ORDER — METRONIDAZOLE 500 MG PO TABS: 500.0000 mg | ORAL_TABLET | Freq: Two times a day (BID) | ORAL | 0 refills | Status: DC

## 2016-06-14 MED ORDER — ACYCLOVIR 400 MG PO TABS: 400.0000 mg | ORAL_TABLET | Freq: Three times a day (TID) | ORAL | 0 refills | Status: AC

## 2016-06-14 NOTE — ED Triage Notes (Signed)
Patient c/o vaginal sores and discomfort x 4 days. Patient states that very painful. Has noticed some white discharge.  Last sexual intercourse was about week ago.

## 2016-06-14 NOTE — ED Provider Notes (Signed)
[facility] DEPT Provider Note   CSN: [id number] Arrival date & time: 06/14/16  0840     History   Chief Complaint Chief Complaint  Patient presents with  . Vaginal Discharge    HPI Sherri [name] is a 31 y.o. female.  The history is provided by the patient. No language interpreter was used.  Vaginal Discharge      Sherri [name] is a 31 y.o. female who presents to the Emergency Department complaining of vaginal sores, vaginal discharge.  She reports 2-3 days of burning, vaginal sores. She has dysuria and mild nausea. She also endorses a white vaginal discharge for the last few days. She is sexually active and does not use protection. No history of prior similar symptoms. She denies any abdominal pain, vomiting. Symptoms are moderate, constant, worsening.  Past Medical History:  Diagnosis Date  . Fibromyalgia    denies today  . Hypertension   . 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 Urinary tract infection   . Uterine fibroid     Patient Active Problem List   Diagnosis Date Noted  . Status post cesarean delivery 06/06/2014  . Uterine fibroid 12/25/2013  . Obesity 12/25/2013    Past Surgical History:  Procedure Laterality Date  . CESAREAN SECTION N/A 06/05/2014   Procedure: CESAREAN SECTION;  Surgeon: Truett Mainland, DO;  Location: [facility];  Service: Obstetrics;  Laterality: N/A;  . WISDOM TOOTH EXTRACTION      OB History    Gravida Para Term Preterm AB Living   1 1   1   2    SAB TAB Ectopic Multiple Live Births         1 2       Home Medications    Prior to Admission medications   Medication Sig Start Date End Date Taking? Authorizing Provider  acyclovir (ZOVIRAX) 400 MG tablet Take 1 tablet (400 mg total) by mouth 3 (three) times daily. 06/14/16 06/21/16  Quintella Reichert, MD  metroNIDAZOLE (FLAGYL) 500 MG tablet Take 1 tablet (500 mg total) by mouth 2 (two) times daily. 06/14/16   Quintella Reichert, MD    Family History Family  History  Problem Relation Age of Onset  . Colon cancer Neg Hx   . Colon polyps Neg Hx   . Kidney disease Neg Hx   . Diabetes Neg Hx   . Esophageal cancer Neg Hx   . Gallbladder disease Neg Hx   . Heart disease Neg Hx   . Asthma Neg Hx   . Cancer Neg Hx   . Stroke Neg Hx     Social History Social History  Substance Use Topics  . Smoking status: Never Smoker  . Smokeless tobacco: Never Used  . Alcohol use No     Comment: occasional when not pregnant     Allergies   Patient has no known allergies.   Review of Systems Review of Systems  Genitourinary: Positive for vaginal discharge.  All other systems reviewed and are negative.    Physical Exam Updated Vital Signs BP (!) 111/97 (BP Location: Right Arm)   Pulse 72   Temp 98.1 F (36.7 C) (Oral)   Resp 17   Ht 5\' 3"  (1.6 m)   Wt 150 lb (68 kg)   LMP 05/24/2016   SpO2 98%   BMI 26.57 kg/m   Physical Exam  Constitutional: She is oriented to person, place, and time. She appears well-developed and well-nourished.  HENT:  Head:  Normocephalic and atraumatic.  Cardiovascular: Normal rate and regular rhythm.   No murmur heard. Pulmonary/Chest: Effort normal and breath sounds normal. No respiratory distress.  Abdominal: Soft. There is no tenderness. There is no rebound and no guarding.  Genitourinary:  Genitourinary Comments: base of left labia with linear ulceration with scattered vesicles. No abscess. Moderate, thick white vaginal discharge. No CMT or adnexal tenderness.  Musculoskeletal: She exhibits no edema or tenderness.  Neurological: She is alert and oriented to person, place, and time.  Skin: Skin is warm and dry.  Psychiatric: She has a normal mood and affect. Her behavior is normal.  Nursing note and vitals reviewed.    ED Treatments / Results  Labs (all labs ordered are listed, but only abnormal results are displayed) Labs Reviewed  WET PREP, GENITAL - Abnormal; Notable for the following:        Result Value   Clue Cells Wet Prep HPF POC PRESENT (*)    WBC, Wet Prep HPF POC FEW (*)    All other components within normal limits  URINALYSIS, ROUTINE W REFLEX MICROSCOPIC - Abnormal; Notable for the following:    APPearance HAZY (*)    All other components within normal limits  PREGNANCY, URINE  RPR  HIV ANTIBODY (ROUTINE TESTING)  GC/CHLAMYDIA PROBE AMP ([place]) NOT AT [facility]    EKG  EKG Interpretation None       Radiology No results found.  Procedures Procedures (including critical care time)  Medications Ordered in ED Medications - No data to display   Initial Impression / Assessment and Plan / ED Course  I have reviewed the triage vital signs and the nursing notes.  Pertinent labs & imaging results that were available during my care of the patient were reviewed by me and considered in my medical decision making (see chart for details).     Patient here with dysuria and vaginal sores. Examination is concerning for general herpes. Counseled pt on transmission and safe sexual practices.  Discussed OB/GYN follow-up and home care. Pelvic examination is not consistent with PID or cervicitis.  Final Clinical Impressions(s) / ED Diagnoses   Final diagnoses:  Herpes simplex vulvovaginitis  BV (bacterial vaginosis)    New Prescriptions New Prescriptions   ACYCLOVIR (ZOVIRAX) 400 MG TABLET    Take 1 tablet (400 mg total) by mouth 3 (three) times daily.   METRONIDAZOLE (FLAGYL) 500 MG TABLET    Take 1 tablet (500 mg total) by mouth 2 (two) times daily.     Quintella Reichert, MD 06/14/16 1000

## 2016-06-14 NOTE — ED Notes (Signed)
Patient name was left in expected column after registration arrived patient.  Patient asked registration why people where going back before her.  This RN apologized to patient for the miscommunication.

## 2016-06-15 LAB — GC/CHLAMYDIA PROBE AMP (~~LOC~~) NOT AT ARMC
Chlamydia: NEGATIVE
Neisseria Gonorrhea: NEGATIVE

## 2016-06-15 LAB — RPR: RPR Ser Ql: NONREACTIVE

## 2016-06-15 LAB — HIV ANTIBODY (ROUTINE TESTING W REFLEX): HIV Screen 4th Generation wRfx: NONREACTIVE

## 2016-06-22 ENCOUNTER — Ambulatory Visit (HOSPITAL_COMMUNITY)
Admission: EM | Admit: 2016-06-22 | Discharge: 2016-06-22 | Disposition: A | Discharge status: Home / Self Care | Payer: Self-pay | Attending: Internal Medicine | Admitting: Internal Medicine

## 2016-06-22 ENCOUNTER — Encounter (HOSPITAL_COMMUNITY): Payer: Self-pay | Admitting: Emergency Medicine

## 2016-06-22 DIAGNOSIS — S0003XA Contusion of scalp, initial encounter: Secondary | ICD-10-CM

## 2016-06-22 DIAGNOSIS — S161XXA Strain of muscle, fascia and tendon at neck level, initial encounter: Secondary | ICD-10-CM

## 2016-06-22 DIAGNOSIS — R51 Headache: Secondary | ICD-10-CM

## 2016-06-22 DIAGNOSIS — R519 Headache, unspecified: Secondary | ICD-10-CM

## 2016-06-22 DIAGNOSIS — S1093XA Contusion of unspecified part of neck, initial encounter: Secondary | ICD-10-CM

## 2016-06-22 DIAGNOSIS — S0083XA Contusion of other part of head, initial encounter: Secondary | ICD-10-CM

## 2016-06-22 MED ORDER — DICLOFENAC SODIUM 1 % TD GEL: 1.0000 "application " | Freq: Four times a day (QID) | TRANSDERMAL | 0 refills | Status: DC

## 2016-06-22 MED ORDER — NAPROXEN 375 MG PO TABS: 375.0000 mg | ORAL_TABLET | Freq: Two times a day (BID) | ORAL | 0 refills | Status: DC
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
## 2016-06-22 NOTE — Discharge Instructions (Signed)
Read the instructions attached to these papers. They were given information on things to look for and treatment. For the first couple days apply ice or cold compresses to the side of the face and scalp. Apply heat to the muscles around her neck and shoulders. If you develop problems with vision, speech or hearing, problems with memory or thinking or unusually sleepy, hard awake, confused, agitated or other problems go directly to the emergency department. You will have soreness for the next few days. Take the medication as directed

## 2016-07-04 IMAGING — US US OB TRANSVAGINAL
1 series · 14 of 28 positions shown · non-contrast
Comparison: None.

CLINICAL DATA: Positive pregnancy test, right lower quadrant pelvic
pain

EXAM:
OBSTETRIC <14 WK US AND TRANSVAGINAL OB US
TECHNIQUE: Both transabdominal and transvaginal ultrasound examinations were
performed for complete evaluation of the gestation as well as the
maternal uterus, adnexal regions, and pelvic cul-de-sac.
Transvaginal technique was performed to assess early pregnancy.

[Series 1: us ob transvaginal · 0.22mm/px · 57 acquisitions, 14 frames shown]
[im 3/57]
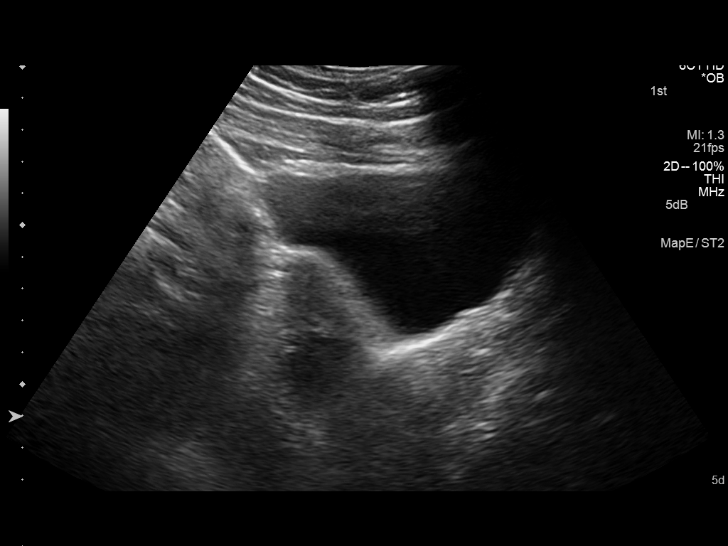
[im 7/57]
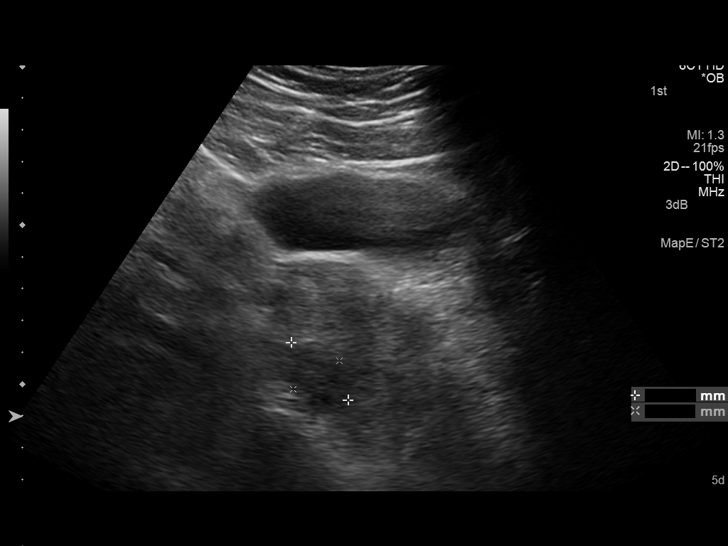
[im 11/57]
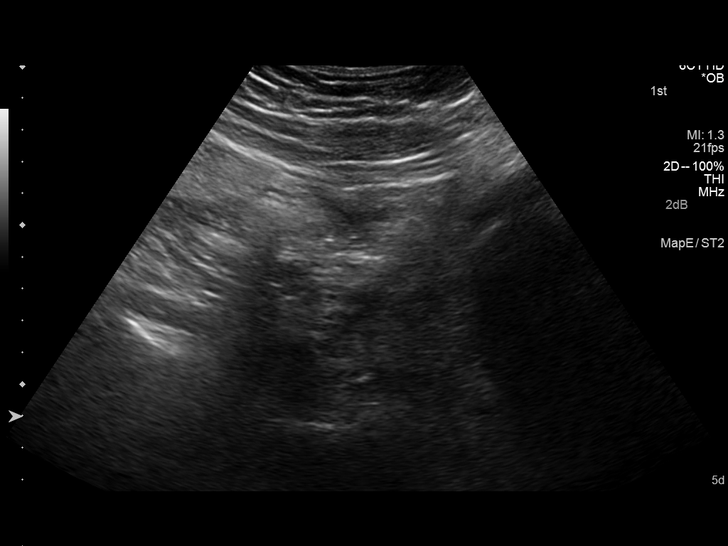
[im 15/57]
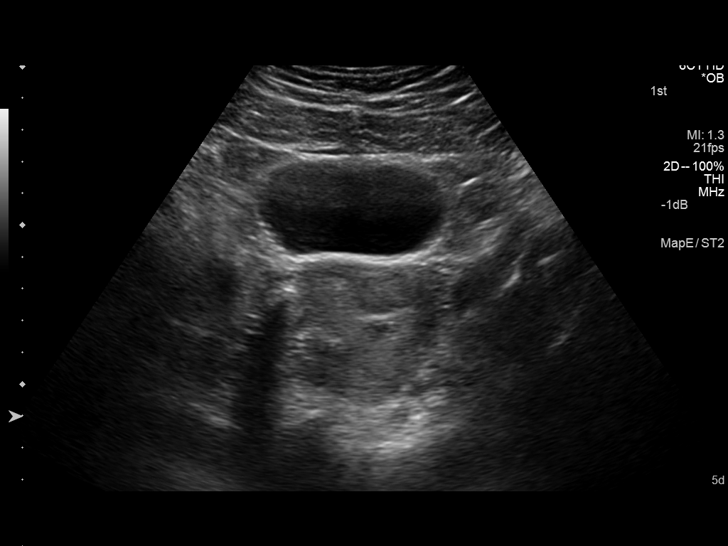
[im 19/57]
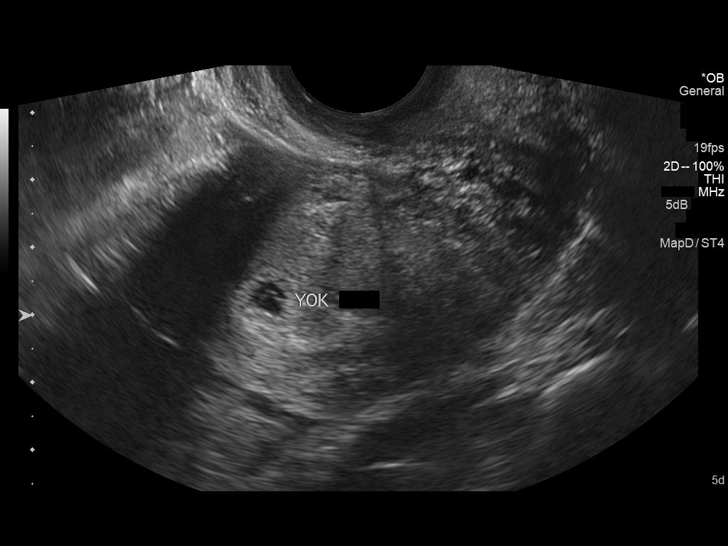
[im 23/57]
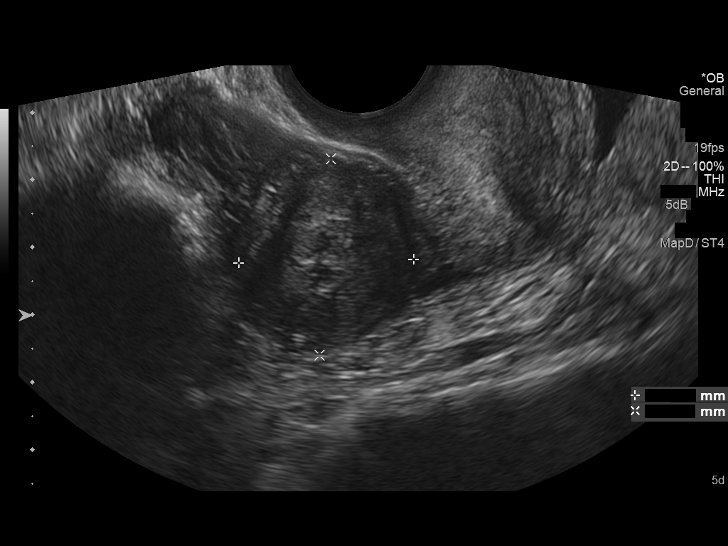
[im 27/57]
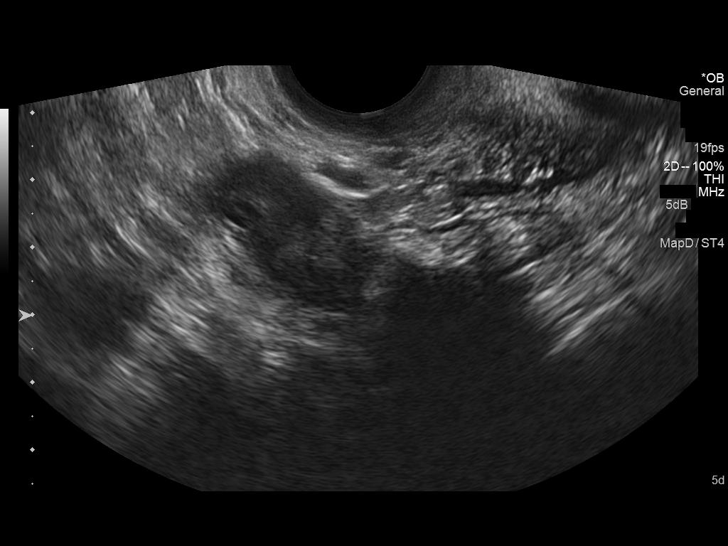
[im 32/57]
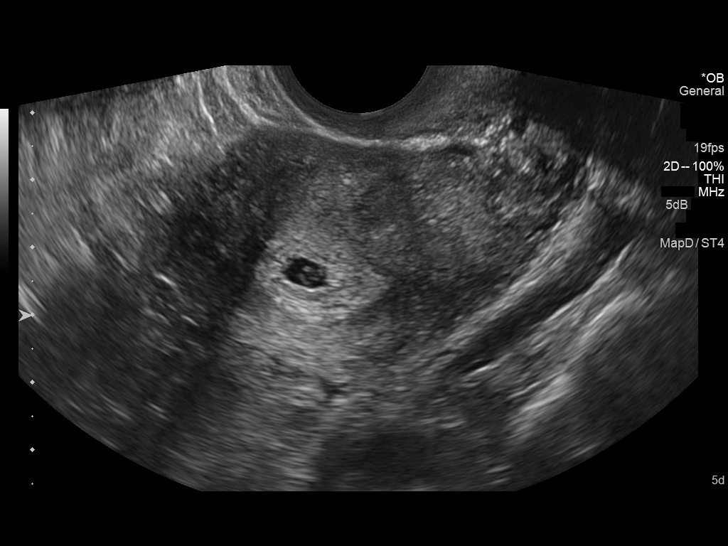
[im 36/57]
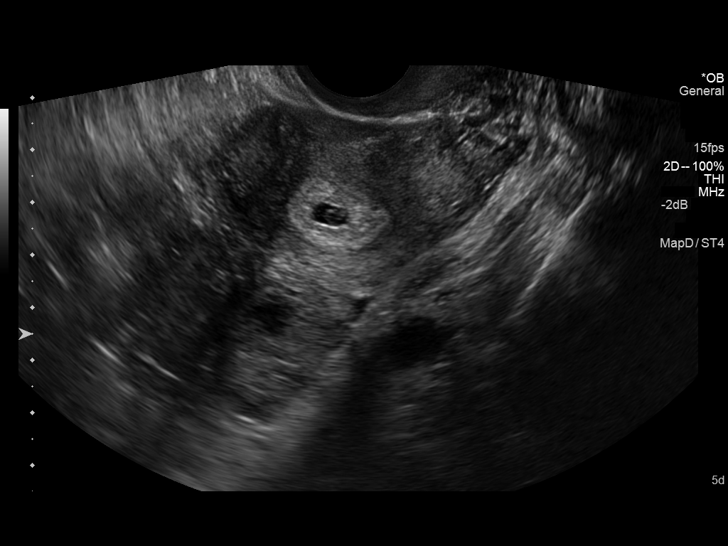
[im 40/57]
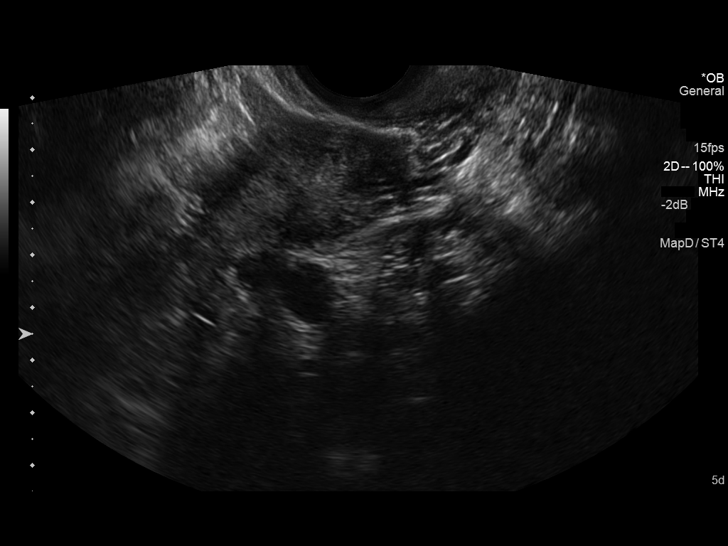
[im 44/57]
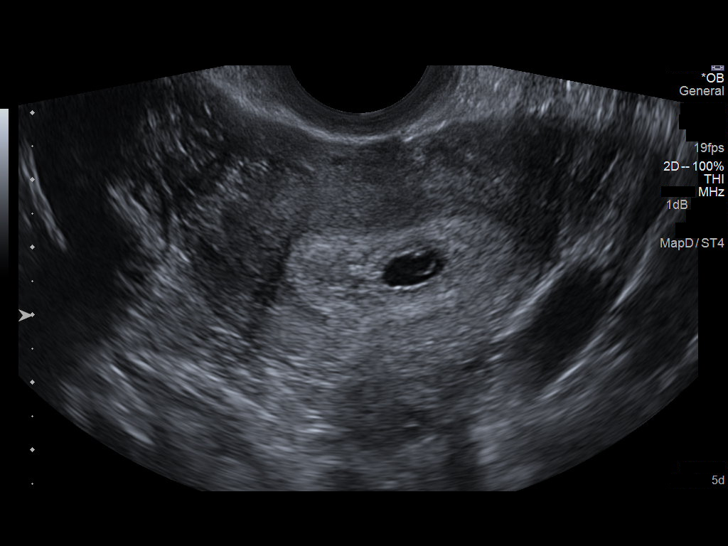
[im 48/57]
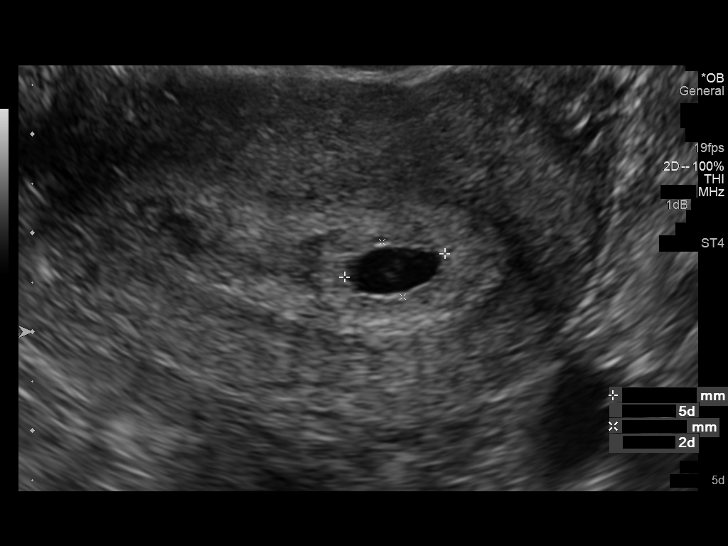
[im 52/57]
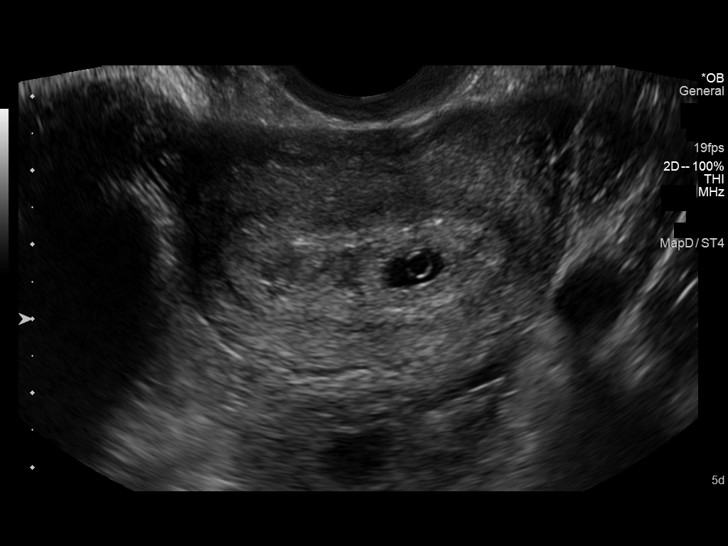
[im 57/57]
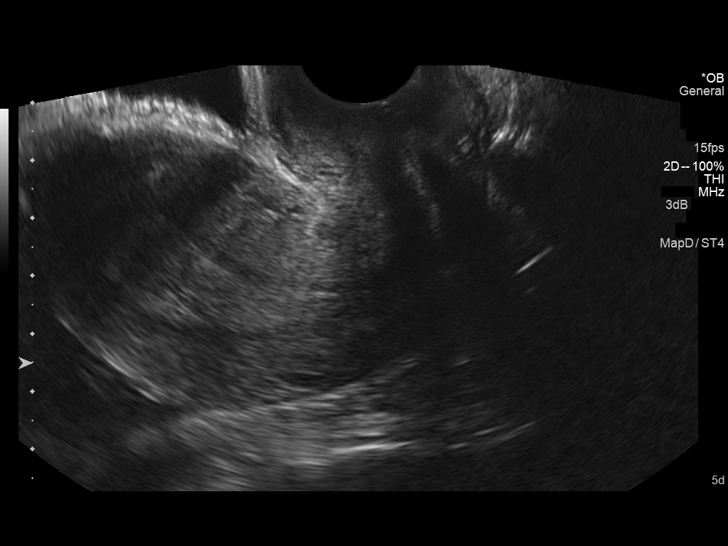

[14 of 28 positions shown; findings below may reference images not displayed]

FINDINGS: Intrauterine gestational sac: Visualized, normal in shape

Yolk sac: Visualized. The amnion may be faintly visualized adjacent
to the yolk sac versus a second yolk sac.

Embryo:  Not visualized

Cardiac Activity: Not visualized

MSD:  9  mm   5 w   5                  US EDC: 07/03/14

Maternal uterus/adnexae: Right lateral uterine fibroid measures
x 2.6 x 2.3 cm. Left ovarian corpus luteum noted. Right ovary is
normal. Trace free fluid is present.
IMPRESSION: Intrauterine gestational sac and yolk sac visualized but no fetal
pole or cardiac activity yet visualized. Followup ultrasound is
recommended in 10-14 days to ensure appropriate pregnancy
progression and for the purposes of accurate dating.

## 2016-07-14 ENCOUNTER — Emergency Department (HOSPITAL_COMMUNITY)
Admission: EM | Admit: 2016-07-14 | Discharge: 2016-07-14 | Disposition: A | Discharge status: Home / Self Care | Payer: Self-pay | Attending: Emergency Medicine | Admitting: Emergency Medicine

## 2016-07-14 ENCOUNTER — Encounter (HOSPITAL_COMMUNITY): Payer: Self-pay

## 2016-07-14 DIAGNOSIS — J069 Acute upper respiratory infection, unspecified: Secondary | ICD-10-CM | POA: Insufficient documentation

## 2016-07-14 DIAGNOSIS — B9789 Other viral agents as the cause of diseases classified elsewhere: Secondary | ICD-10-CM

## 2016-07-14 DIAGNOSIS — I10 Essential (primary) hypertension: Secondary | ICD-10-CM | POA: Insufficient documentation

## 2016-07-14 DIAGNOSIS — J039 Acute tonsillitis, unspecified: Secondary | ICD-10-CM | POA: Insufficient documentation

## 2016-07-14 LAB — RAPID STREP SCREEN (MED CTR MEBANE ONLY): Streptococcus, Group A Screen (Direct): NEGATIVE

## 2016-07-14 MED ORDER — PREDNISONE 50 MG PO TABS: 50.0000 mg | ORAL_TABLET | Freq: Every day | ORAL | 0 refills | Status: DC

## 2016-07-14 MED ORDER — PROMETHAZINE-DM 6.25-15 MG/5ML PO SYRP: 5.0000 mL | ORAL_SOLUTION | Freq: Four times a day (QID) | ORAL | 0 refills | Status: DC | PRN

## 2016-07-14 MED ORDER — GUAIFENESIN ER 1200 MG PO TB12
1.0000 | ORAL_TABLET | Freq: Two times a day (BID) | ORAL | 0 refills | Status: DC
Start: 2016-07-14 — End: 2016-08-12

## 2016-07-14 NOTE — ED Triage Notes (Signed)
PT C/O SORE THROAT WITH A MILD COUGH SINCE THIS AM. DENIES FEVER.

## 2016-07-14 NOTE — ED Notes (Signed)
Pt ambulatory and independent at discharge.  Verbalized understanding of discharge instructions 

## 2016-07-14 NOTE — Discharge Instructions (Addendum)
Return here as needed.  Follow-up with a primary doctor.  Increase her fluid intake, rest as much as possible

## 2016-07-14 NOTE — ED Provider Notes (Signed)
[facility] DEPT Provider Note   CSN: [id number] Arrival date & time: 07/14/16  2044     History   Chief Complaint Chief Complaint  Patient presents with  . Sore Throat    HPI Sherri [name] is a 31 y.o. female.  HPI Patient presents to the emergency department with sore throat, cough since this morning.  The patient states that she is also noticing nasal congestion and drainage.  Feels like her allergies are bothering her, and that is causing her symptoms.  Patient states that nothing seems make her condition better or worse.  Patient states that she did not take any medications prior to arrival. The patient denies chest pain, shortness of breath, headache,blurred vision, neck pain, fever,  weakness, numbness, dizziness, anorexia, edema, abdominal pain, nausea, vomiting, diarrhea, rash, back pain, dysuria, hematemesis, bloody stool, near syncope, or syncope. Past Medical History:  Diagnosis Date  . Fibromyalgia    denies today  . Hypertension   . 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 Urinary tract infection   . Uterine fibroid     Patient Active Problem List   Diagnosis Date Noted  . Status post cesarean delivery 06/06/2014  . Uterine fibroid 12/25/2013  . Obesity 12/25/2013    Past Surgical History:  Procedure Laterality Date  . CESAREAN SECTION N/A 06/05/2014   Procedure: CESAREAN SECTION;  Surgeon: Truett Mainland, DO;  Location: [facility];  Service: Obstetrics;  Laterality: N/A;  . WISDOM TOOTH EXTRACTION      OB History    Gravida Para Term Preterm AB Living   1 1   1   2    SAB TAB Ectopic Multiple Live Births         1 2       Home Medications    Prior to Admission medications   Medication Sig Start Date End Date Taking? Authorizing Provider  diclofenac sodium (VOLTAREN) 1 % GEL Apply 1 application topically 4 (four) times daily. 06/22/16   Janne Napoleon, NP  naproxen (NAPROSYN) 375 MG tablet Take 1 tablet (375 mg total) by mouth  2 (two) times daily. 06/22/16   Janne Napoleon, NP    Family History Family History  Problem Relation Age of Onset  . Colon cancer Neg Hx   . Colon polyps Neg Hx   . Kidney disease Neg Hx   . Diabetes Neg Hx   . Esophageal cancer Neg Hx   . Gallbladder disease Neg Hx   . Heart disease Neg Hx   . Asthma Neg Hx   . Cancer Neg Hx   . Stroke Neg Hx     Social History Social History  Substance Use Topics  . Smoking status: Never Smoker  . Smokeless tobacco: Never Used  . Alcohol use No     Comment: occasional when not pregnant     Allergies   Patient has no known allergies.   Review of Systems Review of Systems All other systems negative except as documented in the HPI. All pertinent positives and negatives as reviewed in the HPI.  Physical Exam Updated Vital Signs BP (!) 144/94 (BP Location: Right Arm)   Pulse 98   Temp 98.5 F (36.9 C) (Oral)   Resp 20   Ht 5\' 3"  (1.6 m)   Wt 77.1 kg   LMP 06/15/2016   SpO2 100%   BMI 30.11 kg/m   Physical Exam  Constitutional: She is oriented to person, place, and time. She appears well-developed  and well-nourished. No distress.  HENT:  Head: Normocephalic and atraumatic.  Mouth/Throat: Oropharynx is clear and moist.  Eyes: Pupils are equal, round, and reactive to light.  Neck: Normal range of motion. Neck supple.  Cardiovascular: Normal rate, regular rhythm and normal heart sounds.  Exam reveals no gallop and no friction rub.   No murmur heard. Pulmonary/Chest: Effort normal and breath sounds normal. No respiratory distress. She has no wheezes.  Neurological: She is alert and oriented to person, place, and time. She exhibits normal muscle tone. Coordination normal.  Skin: Skin is warm and dry. Capillary refill takes less than 2 seconds. No rash noted. No erythema.  Psychiatric: She has a normal mood and affect. Her behavior is normal.  Nursing note and vitals reviewed.    ED Treatments / Results  Labs (all labs ordered  are listed, but only abnormal results are displayed) Labs Reviewed  RAPID STREP SCREEN (NOT AT [facility])  CULTURE, GROUP A STREP [facility])    EKG  EKG Interpretation None       Radiology No results found.  Procedures Procedures (including critical care time)  Medications Ordered in ED Medications - No data to display   Initial Impression / Assessment and Plan / ED Course  I have reviewed the triage vital signs and the nursing notes.  Pertinent labs & imaging results that were available during my care of the patient were reviewed by me and considered in my medical decision making (see chart for details).     Patient be treated for a viral URI versus allergic rhinitis and upper respiratory symptoms.  Patient is advised return here as needed.  Patient agrees the plan and all questions were answered.  Told to increase her fluid intake and rest as much possible Final Clinical Impressions(s) / ED Diagnoses   Final diagnoses:  None    New Prescriptions New Prescriptions   No medications on file     Dalia Heading, PA-C 07/16/16 Hytop, Ankit, MD 07/17/16 1410

## 2016-07-17 LAB — CULTURE, GROUP A STREP (THRC)

## 2016-08-12 ENCOUNTER — Ambulatory Visit (HOSPITAL_COMMUNITY)
Admission: EM | Admit: 2016-08-12 | Discharge: 2016-08-12 | Disposition: A | Discharge status: Home / Self Care | Payer: Self-pay | Attending: Family Medicine | Admitting: Family Medicine

## 2016-08-12 ENCOUNTER — Telehealth (HOSPITAL_COMMUNITY): Payer: Self-pay | Admitting: Emergency Medicine

## 2016-08-12 ENCOUNTER — Encounter (HOSPITAL_COMMUNITY): Payer: Self-pay | Admitting: Emergency Medicine

## 2016-08-12 DIAGNOSIS — B9789 Other viral agents as the cause of diseases classified elsewhere: Secondary | ICD-10-CM

## 2016-08-12 DIAGNOSIS — J302 Other seasonal allergic rhinitis: Secondary | ICD-10-CM

## 2016-08-12 DIAGNOSIS — J069 Acute upper respiratory infection, unspecified: Secondary | ICD-10-CM

## 2016-08-12 MED ORDER — FLUTICASONE PROPIONATE 50 MCG/ACT NA SUSP: 1.0000 | Freq: Every day | NASAL | 2 refills | Status: DC

## 2016-08-12 MED ORDER — FEXOFENADINE HCL 60 MG PO TABS: 180.0000 mg | ORAL_TABLET | Freq: Every day | ORAL | 0 refills | Status: DC

## 2016-08-12 MED ORDER — FEXOFENADINE HCL 60 MG PO TABS
180.0000 mg | ORAL_TABLET | Freq: Every day | ORAL | 0 refills | Status: DC
Start: 2016-08-12 — End: 2016-08-12

## 2016-08-12 MED ORDER — DM-GUAIFENESIN ER 30-600 MG PO TB12: 1.0000 | ORAL_TABLET | Freq: Two times a day (BID) | ORAL | 0 refills | Status: DC | PRN

## 2016-08-12 NOTE — Discharge Instructions (Signed)
Saline nasal rinse as advised

## 2016-08-12 NOTE — ED Triage Notes (Signed)
Pt reports coughing and sneezing for the last three days.  Pt states she has been taking Mucinex with little relief.  She denies any nasal congestion.

## 2016-08-12 NOTE — ED Provider Notes (Signed)
CSN: [id number]     Arrival date & time 08/12/16  1048 History   First MD Initiated Contact with Patient 08/12/16 1148     Chief Complaint  Patient presents with  . URI   (Consider location/radiation/quality/duration/timing/severity/associated sxs/prior Treatment) The history is provided by the patient.  URI  Presenting symptoms: congestion and cough   Severity:  Moderate Onset quality:  Gradual Duration:  3 days Timing:  Constant Progression:  Worsening Relieved by:  Nothing Worsened by:  Nothing Ineffective treatments:  OTC medications 31 yo female presented with CC of cough, congestion, sore throat x 3 days. Taking Mucinex with no relief in Sx.Denies fever/chills/SOB.   Past Medical History:  Diagnosis Date  . Fibromyalgia    denies today  . Hypertension   . 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 Urinary tract infection   . Uterine fibroid    Past Surgical History:  Procedure Laterality Date  . CESAREAN SECTION N/A 06/05/2014   Procedure: CESAREAN SECTION;  Surgeon: Truett Mainland, DO;  Location: [facility];  Service: Obstetrics;  Laterality: N/A;  . WISDOM TOOTH EXTRACTION     Family History  Problem Relation Age of Onset  . Colon cancer Neg Hx   . Colon polyps Neg Hx   . Kidney disease Neg Hx   . Diabetes Neg Hx   . Esophageal cancer Neg Hx   . Gallbladder disease Neg Hx   . Heart disease Neg Hx   . Asthma Neg Hx   . Cancer Neg Hx   . Stroke Neg Hx    Social History  Substance Use Topics  . Smoking status: Never Smoker  . Smokeless tobacco: Never Used  . Alcohol use No     Comment: occasional when not pregnant   OB History    Gravida Para Term Preterm AB Living   1 1   1   2    SAB TAB Ectopic Multiple Live Births         1 2     Review of Systems  HENT: Positive for congestion and postnasal drip.   Respiratory: Positive for cough.     Allergies  Patient has no known allergies.  Home Medications   Prior to Admission  medications   Medication Sig Start Date End Date Taking? Authorizing Provider  dextromethorphan-guaiFENesin (MUCINEX DM) 30-600 MG 12hr tablet Take 1 tablet by mouth 2 (two) times daily as needed for cough. 08/12/16   Alisah Grandberry, NP  diclofenac sodium (VOLTAREN) 1 % GEL Apply 1 application topically 4 (four) times daily. 06/22/16   Janne Napoleon, NP  fexofenadine (ALLEGRA) 60 MG tablet Take 3 tablets (180 mg total) by mouth daily. 08/12/16 08/22/16  Aneesha Holloran, NP  fluticasone (FLONASE) 50 MCG/ACT nasal spray Place 1 spray into both nostrils daily. 08/12/16   Bazil Dhanani, NP  naproxen (NAPROSYN) 375 MG tablet Take 1 tablet (375 mg total) by mouth 2 (two) times daily. 06/22/16   Janne Napoleon, NP  predniSONE (DELTASONE) 50 MG tablet Take 1 tablet (50 mg total) by mouth daily with breakfast. 07/14/16   Lawyer, Harrell Gave, PA-C  promethazine-dextromethorphan (PROMETHAZINE-DM) 6.25-15 MG/5ML syrup Take 5 mLs by mouth 4 (four) times daily as needed for cough. 07/14/16   Lawyer, Harrell Gave, PA-C   Meds Ordered and Administered this Visit  Medications - No data to display  BP 98/82 (BP Location: Right Arm)   Pulse 84   Temp 98.4 F (36.9 C) (Oral)   LMP 07/29/2016 (Approximate)  No data found.   Physical Exam  Constitutional: She appears well-developed and well-nourished. No distress.  HENT:  Head: Normocephalic.  Nose: Mucosal edema present.  Mouth/Throat: No oropharyngeal exudate.  B/L nasal mucosal inflammation R>L. Post nasal drip +ve.  Eyes: Pupils are equal, round, and reactive to light.  Neck: Normal range of motion.  Cardiovascular: Normal rate and regular rhythm.   Pulmonary/Chest: Effort normal and breath sounds normal. No respiratory distress. She has no wheezes. She has no rales. She exhibits no tenderness.  Skin: Skin is warm.  Psychiatric: She has a normal mood and affect.    Urgent Care Course     Procedures (including critical care time)  Labs  Review Labs Reviewed - No data to display  Imaging Review No results found.   Visual Acuity Review  Right Eye Distance:   Left Eye Distance:   Bilateral Distance:    Right Eye Near:   Left Eye Near:    Bilateral Near:         MDM   1. Viral URI with cough   2. Seasonal allergic rhinitis, unspecified trigger   Saline nasal rinse as advised     Jerilee Space, NP 08/12/16 1201

## 2016-08-12 NOTE — Telephone Encounter (Signed)
The patient requested that her medications be re-sent to [facility] at [place].

## 2016-08-14 ENCOUNTER — Encounter (HOSPITAL_COMMUNITY): Payer: Self-pay

## 2016-08-14 ENCOUNTER — Emergency Department (HOSPITAL_COMMUNITY)
Admission: EM | Admit: 2016-08-14 | Discharge: 2016-08-14 | Disposition: A | Discharge status: Home / Self Care | Payer: Self-pay | Attending: Emergency Medicine | Admitting: Emergency Medicine

## 2016-08-14 DIAGNOSIS — J069 Acute upper respiratory infection, unspecified: Secondary | ICD-10-CM | POA: Insufficient documentation

## 2016-08-14 DIAGNOSIS — B9789 Other viral agents as the cause of diseases classified elsewhere: Secondary | ICD-10-CM

## 2016-08-14 MED ORDER — OXYMETAZOLINE HCL 0.05 % NA SOLN: 1.0000 | Freq: Two times a day (BID) | NASAL | 0 refills | Status: DC

## 2016-08-14 NOTE — ED Triage Notes (Signed)
Pt reports cough and sneezing since Tuesday. She denies N/V/D. States that she has been taking Allegra without relief. A&Ox4. Ambulatory.

## 2016-08-14 NOTE — ED Provider Notes (Signed)
[facility] DEPT Provider Note   CSN: [id number] Arrival date & time: 08/14/16  1318  By signing my name below, I, Dora Sims, attest that this documentation has been prepared under the direction and in the presence of Providence Lanius, PA-C. Electronically Signed: Dora Sims, Scribe. 08/14/2016. 2:53 PM.  History   Chief Complaint Chief Complaint  Patient presents with  . Cough   The history is provided by the patient. No language interpreter was used.    HPI Comments: TERRESA MARLETT is a 31 y.o. female with PMHx including seasonal allergies who presents to the Emergency Department complaining of a persistent cough with intermittently productive of yellow-tinted sputum x 4 days. She reports some associated nasal congestion. 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 No other medications or treatments tried. Patient has contacts with similar symptoms at work. No h/o asthma. She denies dyspnea, sore throat, nausea, vomiting, diarrhea, fevers, chills, or any other associated symptoms.  Past Medical History:  Diagnosis Date  . Fibromyalgia    denies today  . Hypertension   . 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 Urinary tract infection   . Uterine fibroid     Patient Active Problem List   Diagnosis Date Noted  . Status post cesarean delivery 06/06/2014  . Uterine fibroid 12/25/2013  . Obesity 12/25/2013    Past Surgical History:  Procedure Laterality Date  . CESAREAN SECTION N/A 06/05/2014   Procedure: CESAREAN SECTION;  Surgeon: Truett Mainland, DO;  Location: [facility];  Service: Obstetrics;  Laterality: N/A;  . WISDOM TOOTH EXTRACTION      OB History    Gravida Para Term Preterm AB Living   1 1   1   2    SAB TAB Ectopic Multiple Live Births         1 2       Home Medications    Prior to Admission medications   Medication Sig Start Date End Date Taking? Authorizing Provider  dextromethorphan-guaiFENesin  (MUCINEX DM) 30-600 MG 12hr tablet Take 1 tablet by mouth 2 (two) times daily as needed for cough. 08/12/16   Vanessa Kick, MD  diclofenac sodium (VOLTAREN) 1 % GEL Apply 1 application topically 4 (four) times daily. 06/22/16   Janne Napoleon, NP  fexofenadine (ALLEGRA) 60 MG tablet Take 3 tablets (180 mg total) by mouth daily. 08/12/16 08/22/16  Vanessa Kick, MD  fluticasone (FLONASE) 50 MCG/ACT nasal spray Place 1 spray into both nostrils daily. 08/12/16   Vanessa Kick, MD  naproxen (NAPROSYN) 375 MG tablet Take 1 tablet (375 mg total) by mouth 2 (two) times daily. 06/22/16   Janne Napoleon, NP  oxymetazoline (AFRIN NASAL SPRAY) 0.05 % nasal spray Place 1 spray into both nostrils 2 (two) times daily. Do not use for more than 3 days 08/14/16   Volanda Napoleon, PA-C  predniSONE (DELTASONE) 50 MG tablet Take 1 tablet (50 mg total) by mouth daily with breakfast. 07/14/16   Lawyer, Harrell Gave, PA-C  promethazine-dextromethorphan (PROMETHAZINE-DM) 6.25-15 MG/5ML syrup Take 5 mLs by mouth 4 (four) times daily as needed for cough. 07/14/16   Dalia Heading, PA-C    Family History Family History  Problem Relation Age of Onset  . Colon cancer Neg Hx   . Colon polyps Neg Hx   . Kidney disease Neg Hx   . Diabetes Neg Hx   . Esophageal cancer Neg Hx   . Gallbladder disease Neg Hx   . Heart disease Neg  Hx   . Asthma Neg Hx   . Cancer Neg Hx   . Stroke Neg Hx     Social History Social History  Substance Use Topics  . Smoking status: Never Smoker  . Smokeless tobacco: Never Used  . Alcohol use No     Comment: occasional when not pregnant     Allergies   Patient has no known allergies.   Review of Systems Review of Systems  Constitutional: Negative for chills and fever.  HENT: Positive for congestion. Negative for sore throat.   Respiratory: Positive for cough. Negative for shortness of breath.   Gastrointestinal: Negative for nausea and vomiting.  All other systems reviewed and are  negative.  Physical Exam Updated Vital Signs BP 137/81 (BP Location: Left Arm)   Pulse 91   Temp 98.2 F (36.8 C) (Oral)   Resp 16   LMP 07/29/2016 (Approximate)   SpO2 100%   Physical Exam  Constitutional: She appears well-developed and well-nourished.  Sitting comfortably on examination table  HENT:  Head: Normocephalic and atraumatic.  Mouth/Throat: Uvula is midline, oropharynx is clear and moist and mucous membranes are normal.  Nasal congestion present. Posterior oropharynx is clear without evidence of erythema, exudates or edema. No evidence of peritonsillar abscess.  Eyes: Conjunctivae and EOM are normal. Right eye exhibits no discharge. Left eye exhibits no discharge. No scleral icterus.  Cardiovascular: Regular rhythm and normal pulses.   Pulmonary/Chest: Effort normal and breath sounds normal.  No evidence of respiratory distress. Able to speak in full sentences without difficulty. No wheezing or rales noted on exam.  Neurological: She is alert.  Skin: Skin is warm and dry.  Psychiatric: She has a normal mood and affect. Her speech is normal and behavior is normal.  Nursing note and vitals reviewed.  ED Treatments / Results  Labs (all labs ordered are listed, but only abnormal results are displayed) Labs Reviewed - No data to display  EKG  EKG Interpretation None       Radiology No results found.  Procedures Procedures (including critical care time)  DIAGNOSTIC STUDIES: Oxygen Saturation is 100% on RA, normal by my interpretation.    COORDINATION OF CARE: 2:53 PM Discussed treatment plan with pt at bedside and pt agreed to plan.  Medications Ordered in ED Medications - No data to display   Initial Impression / Assessment and Plan / ED Course  I have reviewed the triage vital signs and the nursing notes.  Pertinent labs & imaging results that were available during my care of the patient were reviewed by me and considered in my medical decision  making (see chart for details).     31 year old female who presents with cough and congestion 4 days. Seen previously on 08/12/16 for same symptoms. Patient is afebrile, non-toxic appearing, sitting comfortably on examination table. Vital signs reviewed and stable. Take Allegra no improvement of symptoms. History/physical exam are consistent with URI. History/physical exam or not concerning for strep pharyngitis or pneumonia. Her dictation for imaging at this time. Will plan to treat symptomatically. Provided patient with a list of clinic resources to use if he does not have a PCP. Instructed to call them today to arrange follow-up in the next 24-48 hours.  Strict return precautions discussed. Patient expresses understanding and agreement to plan.  Final Clinical Impressions(s) / ED Diagnoses   Final diagnoses:  Viral URI with cough    New Prescriptions New Prescriptions   OXYMETAZOLINE (AFRIN NASAL SPRAY) 0.05 % NASAL SPRAY  Place 1 spray into both nostrils 2 (two) times daily. Do not use for more than 3 days   I personally performed the services described in this documentation, which was scribed in my presence. The recorded information has been reviewed and is accurate.    Volanda Napoleon, PA-C 08/14/16 1524    Dorie Rank, MD 08/16/16 [phone]

## 2016-08-14 NOTE — Discharge Instructions (Signed)
You can take Delsym over-the-counter cough medication or decongestants to help with symptoms.  You can use the nasal spray as directed to help with congestion. Do not take it for more than 3 days as it will cause the congestion to return.  Follow-up with her primary care doctor next 24-48 hours for further evaluation. If he do not have a primary care doctor he can use on listed below.  Return the emergency Department for any worsening cough, fever, difficulty breathing, congestion, or any other worsening or concerning symptoms.   If you do not have a primary care doctor you see regularly, please you the list below. Please call them to arrange for follow-up.    No Primary Care Doctor Call [place] Other agencies that provide inexpensive medical care    [place]  [phone]    [facility]  [place]  [phone]    [facility]  [phone]    [facility]  [phone]    [facility]  [phone]

## 2016-08-19 ENCOUNTER — Emergency Department (HOSPITAL_COMMUNITY)
Admission: EM | Admit: 2016-08-19 | Discharge: 2016-08-19 | Disposition: A | Discharge status: Home / Self Care | Payer: Self-pay | Attending: Emergency Medicine | Admitting: Emergency Medicine

## 2016-08-19 ENCOUNTER — Encounter (HOSPITAL_COMMUNITY): Payer: Self-pay | Admitting: Emergency Medicine

## 2016-08-19 DIAGNOSIS — Z79899 Other long term (current) drug therapy: Secondary | ICD-10-CM | POA: Insufficient documentation

## 2016-08-19 DIAGNOSIS — J302 Other seasonal allergic rhinitis: Secondary | ICD-10-CM | POA: Insufficient documentation

## 2016-08-19 DIAGNOSIS — B9789 Other viral agents as the cause of diseases classified elsewhere: Secondary | ICD-10-CM

## 2016-08-19 DIAGNOSIS — J069 Acute upper respiratory infection, unspecified: Secondary | ICD-10-CM | POA: Insufficient documentation

## 2016-08-19 DIAGNOSIS — I10 Essential (primary) hypertension: Secondary | ICD-10-CM | POA: Insufficient documentation

## 2016-08-19 DIAGNOSIS — J3089 Other allergic rhinitis: Secondary | ICD-10-CM

## 2016-08-19 MED ORDER — CETIRIZINE HCL 10 MG PO TABS: 10.0000 mg | ORAL_TABLET | Freq: Every day | ORAL | 1 refills | Status: DC

## 2016-08-19 MED ORDER — BENZONATATE 100 MG PO CAPS: 100.0000 mg | ORAL_CAPSULE | Freq: Three times a day (TID) | ORAL | 0 refills | Status: DC | PRN

## 2016-08-19 MED ORDER — FLUTICASONE PROPIONATE 50 MCG/ACT NA SUSP: 2.0000 | Freq: Every day | NASAL | 1 refills | Status: DC

## 2016-08-19 NOTE — ED Provider Notes (Signed)
[facility] DEPT Provider Note   CSN: [id number] Arrival date & time: 08/19/16  1718     History   Chief Complaint Chief Complaint  Patient presents with  . Cough    HPI MYKENZI VANZILE is a 31 y.o. female.  DAVITA SUBLETT is a 31 y.o. Female who presents to the emergency department complaining of 2 days of sneezing, nasal congestion, postnasal drip and coughing. She denies any fevers. She's been taking Allegra without relief of her symptoms. She denies any chest pain or shortness of breath. She denies other complaints. She denies fevers, chest pain, shortness of breath, sore throat, trouble swallowing, ear discharge, rashes.    The history is provided by the patient and medical records. No language interpreter was used.  Cough  Associated symptoms include ear pain and rhinorrhea. Pertinent negatives include no chest pain, no chills, no headaches, no sore throat, no shortness of breath and no wheezing.    Past Medical History:  Diagnosis Date  . Fibromyalgia    denies today  . Hypertension   . 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 Urinary tract infection   . Uterine fibroid     Patient Active Problem List   Diagnosis Date Noted  . Status post cesarean delivery 06/06/2014  . Uterine fibroid 12/25/2013  . Obesity 12/25/2013    Past Surgical History:  Procedure Laterality Date  . CESAREAN SECTION N/A 06/05/2014   Procedure: CESAREAN SECTION;  Surgeon: Truett Mainland, DO;  Location: [facility];  Service: Obstetrics;  Laterality: N/A;  . WISDOM TOOTH EXTRACTION      OB History    Gravida Para Term Preterm AB Living   1 1   1   2    SAB TAB Ectopic Multiple Live Births         1 2       Home Medications    Prior to Admission medications   Medication Sig Start Date End Date Taking? Authorizing Provider  benzonatate (TESSALON) 100 MG capsule Take 1 capsule (100 mg total) by mouth 3 (three) times daily as needed for cough. 08/19/16    Waynetta Pean, PA-C  cetirizine (ZYRTEC ALLERGY) 10 MG tablet Take 1 tablet (10 mg total) by mouth daily. 08/19/16   Waynetta Pean, PA-C  diclofenac sodium (VOLTAREN) 1 % GEL Apply 1 application topically 4 (four) times daily. 06/22/16   Janne Napoleon, NP  fluticasone (FLONASE) 50 MCG/ACT nasal spray Place 2 sprays into both nostrils daily. 08/19/16   Waynetta Pean, PA-C  naproxen (NAPROSYN) 375 MG tablet Take 1 tablet (375 mg total) by mouth 2 (two) times daily. 06/22/16   Janne Napoleon, NP  predniSONE (DELTASONE) 50 MG tablet Take 1 tablet (50 mg total) by mouth daily with breakfast. 07/14/16   Dalia Heading, PA-C    Family History Family History  Problem Relation Age of Onset  . Colon cancer Neg Hx   . Colon polyps Neg Hx   . Kidney disease Neg Hx   . Diabetes Neg Hx   . Esophageal cancer Neg Hx   . Gallbladder disease Neg Hx   . Heart disease Neg Hx   . Asthma Neg Hx   . Cancer Neg Hx   . Stroke Neg Hx     Social History Social History  Substance Use Topics  . Smoking status: Never Smoker  . Smokeless tobacco: Never Used  . Alcohol use No     Comment: occasional when not pregnant  Allergies   Patient has no known allergies.   Review of Systems Review of Systems  Constitutional: Negative for chills and fever.  HENT: Positive for congestion, ear pain, postnasal drip, rhinorrhea and sneezing. Negative for ear discharge, sore throat and trouble swallowing.   Eyes: Negative for visual disturbance.  Respiratory: Positive for cough. Negative for shortness of breath and wheezing.   Cardiovascular: Negative for chest pain.  Skin: Negative for rash and wound.  Neurological: Negative for light-headedness and headaches.     Physical Exam Updated Vital Signs BP (!) 150/92 (BP Location: Left Arm)   Pulse 68   Temp 98.9 F (37.2 C) (Oral)   Resp 16   Ht 5\' 3"  (1.6 m)   Wt 78 kg (172 lb)   LMP 07/29/2016 (Approximate)   SpO2 99%   BMI 30.47 kg/m   Physical  Exam  Constitutional: She appears well-developed and well-nourished. No distress.  Nontoxic appearing.  HENT:  Head: Normocephalic and atraumatic.  Right Ear: External ear normal.  Left Ear: External ear normal.  Mouth/Throat: Oropharynx is clear and moist.  Mild clear middle ear effusion noted bilaterally without TM erythema or loss of landmarks. Boggy nasal turbinates bilaterally. Rhinorrhea present. Throat is clear.  Eyes: Conjunctivae are normal. Pupils are equal, round, and reactive to light. Right eye exhibits no discharge. Left eye exhibits no discharge.  Neck: Normal range of motion. Neck supple. No JVD present. No tracheal deviation present.  Cardiovascular: Normal rate, regular rhythm, normal heart sounds and intact distal pulses.   Pulmonary/Chest: Effort normal and breath sounds normal. No stridor. No respiratory distress. She has no wheezes. She has no rales.  Lungs are clear to ascultation bilaterally. Symmetric chest expansion bilaterally. No increased work of breathing. No rales or rhonchi.    Abdominal: Soft. There is no tenderness.  Musculoskeletal: She exhibits no edema.  Lymphadenopathy:    She has no cervical adenopathy.  Neurological: She is alert. Coordination normal.  Skin: Skin is warm and dry. Capillary refill takes less than 2 seconds. No rash noted. She is not diaphoretic. No erythema. No pallor.  Psychiatric: She has a normal mood and affect. Her behavior is normal.  Nursing note and vitals reviewed.    ED Treatments / Results  Labs (all labs ordered are listed, but only abnormal results are displayed) Labs Reviewed - No data to display  EKG  EKG Interpretation None       Radiology No results found.  Procedures Procedures (including critical care time)  Medications Ordered in ED Medications - No data to display   Initial Impression / Assessment and Plan / ED Course  I have reviewed the triage vital signs and the nursing  notes.  Pertinent labs & imaging results that were available during my care of the patient were reviewed by me and considered in my medical decision making (see chart for details).    This  is a 31 y.o. Female who presents to the emergency department complaining of 2 days of sneezing, nasal congestion, postnasal drip and coughing. She denies any fevers. On exam the patient is afebrile nontoxic appearing. Lungs are clear to auscultation bilaterally. She is rhinorrhea present. Patient with upper respiratory infection. No need for a chest x-ray as her lungs are clear and she is afebrile. Will start the patient on Flonase, Zyrtec and Tessalon Perles. I discussed return precautions. I advised the patient to follow-up with their primary care provider this week. I advised the patient to return to the  emergency department with new or worsening symptoms or new concerns. The patient verbalized understanding and agreement with plan.     Final Clinical Impressions(s) / ED Diagnoses   Final diagnoses:  Viral URI with cough  Seasonal allergic rhinitis due to other allergic trigger    New Prescriptions New Prescriptions   BENZONATATE (TESSALON) 100 MG CAPSULE    Take 1 capsule (100 mg total) by mouth 3 (three) times daily as needed for cough.   CETIRIZINE (ZYRTEC ALLERGY) 10 MG TABLET    Take 1 tablet (10 mg total) by mouth daily.   FLUTICASONE (FLONASE) 50 MCG/ACT NASAL SPRAY    Place 2 sprays into both nostrils daily.     Waynetta Pean, PA-C 08/19/16 1959    Merrily Pew, MD 08/19/16 2036

## 2016-08-19 NOTE — ED Triage Notes (Signed)
Pt was here on 6/15 for viral cough and returns with same symptoms.  States she is coughing so hard it makes her gag.  Denies seeing an allergist in the past.  Ambulatory.  A&O x4.  Airway intact.

## 2016-09-03 ENCOUNTER — Emergency Department (HOSPITAL_COMMUNITY): Payer: Self-pay

## 2016-09-03 ENCOUNTER — Encounter (HOSPITAL_COMMUNITY): Payer: Self-pay | Admitting: Nurse Practitioner

## 2016-09-03 ENCOUNTER — Emergency Department (HOSPITAL_COMMUNITY)
Admission: EM | Admit: 2016-09-03 | Discharge: 2016-09-03 | Disposition: A | Discharge status: Home / Self Care | Payer: Self-pay | Attending: Emergency Medicine | Admitting: Emergency Medicine

## 2016-09-03 DIAGNOSIS — I10 Essential (primary) hypertension: Secondary | ICD-10-CM | POA: Insufficient documentation

## 2016-09-03 DIAGNOSIS — R05 Cough: Secondary | ICD-10-CM | POA: Insufficient documentation

## 2016-09-03 DIAGNOSIS — Z79899 Other long term (current) drug therapy: Secondary | ICD-10-CM | POA: Insufficient documentation

## 2016-09-03 DIAGNOSIS — R0789 Other chest pain: Secondary | ICD-10-CM | POA: Insufficient documentation

## 2016-09-03 DIAGNOSIS — R053 Chronic cough: Secondary | ICD-10-CM

## 2016-09-03 LAB — BASIC METABOLIC PANEL
Anion gap: 7 (ref 5–15)
BUN: 8 mg/dL (ref 6–20)
CO2: 24 mmol/L (ref 22–32)
Calcium: 9.3 mg/dL (ref 8.9–10.3)
Chloride: 110 mmol/L (ref 101–111)
Creatinine, Ser: 0.88 mg/dL (ref 0.44–1.00)
GFR calc Af Amer: >60 mL/min (ref >60)
GFR calc non Af Amer: >60 mL/min (ref >60)
Glucose, Bld: 101 mg/dL — ABNORMAL HIGH (ref 65–99)
Potassium: 3.6 mmol/L (ref 3.5–5.1)
Sodium: 141 mmol/L (ref 135–145)

## 2016-09-03 LAB — I-STAT TROPONIN, ED: Troponin i, poc: 0 ng/mL (ref 0.00–0.08)

## 2016-09-03 LAB — CBC
HCT: 38.7 % (ref 36.0–46.0)
Hemoglobin: 12.6 g/dL (ref 12.0–15.0)
MCH: 29.2 pg (ref 26.0–34.0)
MCHC: 32.6 g/dL (ref 30.0–36.0)
MCV: 89.6 fL (ref 78.0–100.0)
Platelets: 224 10*3/uL (ref 150–400)
RBC: 4.32 MIL/uL (ref 3.87–5.11)
RDW: 14.7 % (ref 11.5–15.5)
WBC: 6.4 10*3/uL (ref 4.0–10.5)

## 2016-09-03 MED ORDER — IBUPROFEN 800 MG PO TABS
800.0000 mg | ORAL_TABLET | Freq: Once | ORAL | Status: AC
  Administered 2016-09-03: 800 mg via ORAL
  Filled 2016-09-03: qty 1

## 2016-09-03 MED ORDER — ACETAMINOPHEN 500 MG PO TABS
1000.0000 mg | ORAL_TABLET | Freq: Once | ORAL | Status: AC
  Administered 2016-09-03: 1000 mg via ORAL
  Filled 2016-09-03: qty 2

## 2016-09-03 MED ORDER — BENZONATATE 100 MG PO CAPS
100.0000 mg | ORAL_CAPSULE | Freq: Once | ORAL | Status: AC
  Administered 2016-09-03: 100 mg via ORAL
  Filled 2016-09-03: qty 1

## 2016-09-03 MED ORDER — BENZONATATE 100 MG PO CAPS: 100.0000 mg | ORAL_CAPSULE | Freq: Three times a day (TID) | ORAL | 0 refills | Status: DC

## 2016-09-03 MED ORDER — AZITHROMYCIN 250 MG PO TABS: 250.0000 mg | ORAL_TABLET | Freq: Every day | ORAL | 0 refills | Status: DC

## 2016-09-03 NOTE — ED Provider Notes (Signed)
[facility] DEPT Provider Note   CSN: [id number] Arrival date & time: 09/03/16  8341     History   Chief Complaint Chief Complaint  Patient presents with  . Cough  . Chest Pain    HPI Sherri [name] is a 31 y.o. female.  31 yo F with chest pain.  Going on for past month.  Worse with coughing.  No fevers, sob.  Tried ibuprofen without improvement.  No hx of PE/DVT.    The history is provided by the patient.  Cough  Pertinent negatives include no chest pain, no chills, no headaches, no rhinorrhea, no myalgias, no shortness of breath, no wheezing and no eye redness.  Chest Pain   Associated symptoms include cough. Pertinent negatives include no dizziness, no fever, no headaches, no nausea, no palpitations, no shortness of breath and no vomiting.  Illness  This is a new problem. The current episode started more than 1 week ago. The problem occurs constantly. The problem has not changed since onset.Pertinent negatives include no chest pain, no headaches and no shortness of breath. Nothing aggravates the symptoms. Nothing relieves the symptoms. She has tried nothing for the symptoms. The treatment provided no relief.    Past Medical History:  Diagnosis Date  . Fibromyalgia    denies today  . Hypertension   . 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 Urinary tract infection   . Uterine fibroid     Patient Active Problem List   Diagnosis Date Noted  . Status post cesarean delivery 06/06/2014  . Uterine fibroid 12/25/2013  . Obesity 12/25/2013    Past Surgical History:  Procedure Laterality Date  . CESAREAN SECTION N/A 06/05/2014   Procedure: CESAREAN SECTION;  Surgeon: Truett Mainland, DO;  Location: [facility];  Service: Obstetrics;  Laterality: N/A;  . WISDOM TOOTH EXTRACTION      OB History    Gravida Para Term Preterm AB Living   1 1   1   2    SAB TAB Ectopic Multiple Live Births         1 2       Home Medications    Prior to Admission  medications   Medication Sig Start Date End Date Taking? Authorizing Provider  azithromycin (ZITHROMAX) 250 MG tablet Take 1 tablet (250 mg total) by mouth daily. Take first 2 tablets together, then 1 every day until finished. 09/03/16   Deno Etienne, DO  benzonatate (TESSALON) 100 MG capsule Take 1 capsule (100 mg total) by mouth every 8 (eight) hours. 09/03/16   Deno Etienne, DO  cetirizine (ZYRTEC ALLERGY) 10 MG tablet Take 1 tablet (10 mg total) by mouth daily. 08/19/16   Waynetta Pean, PA-C  diclofenac sodium (VOLTAREN) 1 % GEL Apply 1 application topically 4 (four) times daily. 06/22/16   Janne Napoleon, NP  fluticasone (FLONASE) 50 MCG/ACT nasal spray Place 2 sprays into both nostrils daily. 08/19/16   Waynetta Pean, PA-C  naproxen (NAPROSYN) 375 MG tablet Take 1 tablet (375 mg total) by mouth 2 (two) times daily. 06/22/16   Janne Napoleon, NP  predniSONE (DELTASONE) 50 MG tablet Take 1 tablet (50 mg total) by mouth daily with breakfast. 07/14/16   Dalia Heading, PA-C    Family History Family History  Problem Relation Age of Onset  . Colon cancer Neg Hx   . Colon polyps Neg Hx   . Kidney disease Neg Hx   . Diabetes Neg Hx   . Esophageal cancer Neg  Hx   . Gallbladder disease Neg Hx   . Heart disease Neg Hx   . Asthma Neg Hx   . Cancer Neg Hx   . Stroke Neg Hx     Social History Social History  Substance Use Topics  . Smoking status: Never Smoker  . Smokeless tobacco: Never Used  . Alcohol use No     Comment: occasional when not pregnant     Allergies   Zyrtec [cetirizine]   Review of Systems Review of Systems  Constitutional: Negative for chills and fever.  HENT: Negative for congestion and rhinorrhea.   Eyes: Negative for redness and visual disturbance.  Respiratory: Positive for cough. Negative for shortness of breath and wheezing.   Cardiovascular: Negative for chest pain and palpitations.  Gastrointestinal: Negative for nausea and vomiting.  Genitourinary: Negative  for dysuria and urgency.  Musculoskeletal: Negative for arthralgias and myalgias.  Skin: Negative for pallor and wound.  Neurological: Negative for dizziness and headaches.     Physical Exam Updated Vital Signs BP 139/87   Pulse 66   Temp 98.6 F (37 C)   Resp 16   Ht 5\' 3"  (1.6 m)   Wt 79.4 kg (175 lb)   LMP 08/20/2016   SpO2 99%   BMI 31.00 kg/m   Physical Exam  Constitutional: She is oriented to person, place, and time. She appears well-developed and well-nourished. No distress.  HENT:  Head: Normocephalic and atraumatic.  Eyes: EOM are normal. Pupils are equal, round, and reactive to light.  Neck: Normal range of motion. Neck supple.  Cardiovascular: Normal rate and regular rhythm.  Exam reveals no gallop and no friction rub.   No murmur heard. Pulmonary/Chest: Effort normal. She has no wheezes. She has no rales.  Abdominal: Soft. She exhibits no distension and no mass. There is no tenderness. There is no guarding.  Musculoskeletal: She exhibits no edema or tenderness.  Neurological: She is alert and oriented to person, place, and time.  Skin: Skin is warm and dry. She is not diaphoretic.  Psychiatric: She has a normal mood and affect. Her behavior is normal.  Nursing note and vitals reviewed.    ED Treatments / Results  Labs (all labs ordered are listed, but only abnormal results are displayed) Labs Reviewed  BASIC METABOLIC PANEL - Abnormal; Notable for the following:       Result Value   Glucose, Bld 101 (*)    All other components within normal limits  CBC  I-STAT TROPOININ, ED    EKG  EKG Interpretation  Date/Time:  Thursday September 03 2016 09:40:06 EDT Ventricular Rate:  57 PR Interval:  152 QRS Duration: 82 QT Interval:  400 QTC Calculation: 389 R Axis:   10 Text Interpretation:  Sinus bradycardia Otherwise normal ECG No old tracing to compare Confirmed by Deno Etienne [phone]) on 09/03/2016 10:36:44 AM       Radiology Dg Chest 2 View  Result  Date: 09/03/2016 CLINICAL DATA:  Nonproductive cough . EXAM: CHEST  2 VIEW COMPARISON:  01/03/2016 . FINDINGS: Mediastinum and hilar structures normal. Lungs are clear. Heart size normal. No pleural effusion or pneumothorax. No acute bony abnormality . IMPRESSION: No acute abnormality. Electronically Signed   By: Marcello Moores  Register   On: 09/03/2016 10:19    Procedures Procedures (including critical care time)  Medications Ordered in ED Medications  acetaminophen (TYLENOL) tablet 1,000 mg (1,000 mg Oral Given 09/03/16 1058)  ibuprofen (ADVIL,MOTRIN) tablet 800 mg (800 mg Oral Given 09/03/16  1058)  benzonatate (TESSALON) capsule 100 mg (100 mg Oral Given 09/03/16 1058)     Initial Impression / Assessment and Plan / ED Course  I have reviewed the triage vital signs and the nursing notes.  Pertinent labs & imaging results that were available during my care of the patient were reviewed by me and considered in my medical decision making (see chart for details).     31 yo F with a cc of chest pain.  Worse with coughing.  Going on for 3 weeks. Reproduced on exam. As we're in a high rate of pertussis area will treat presumptively with a Z-Pak. I feel that PE and ACS are completely unlikely. Discharge home  11:21 AM:  I have discussed the diagnosis/risks/treatment options with the patient and believe the pt to be eligible for discharge home to follow-up with PCP. We also discussed returning to the ED immediately if new or worsening sx occur. We discussed the sx which are most concerning (e.g., sudden worsening pain, fever, inability to tolerate by mouth) that necessitate immediate return. Medications administered to the patient during their visit and any new prescriptions provided to the patient are listed below.  Medications given during this visit Medications  acetaminophen (TYLENOL) tablet 1,000 mg (1,000 mg Oral Given 09/03/16 1058)  ibuprofen (ADVIL,MOTRIN) tablet 800 mg (800 mg Oral Given 09/03/16 1058)    benzonatate (TESSALON) capsule 100 mg (100 mg Oral Given 09/03/16 1058)     The patient appears reasonably screen and/or stabilized for discharge and I doubt any other medical condition or other [facility] requiring further screening, evaluation, or treatment in the ED at this time prior to discharge.    Final Clinical Impressions(s) / ED Diagnoses   Final diagnoses:  Chronic cough  Chest wall pain    New Prescriptions New Prescriptions   AZITHROMYCIN (ZITHROMAX) 250 MG TABLET    Take 1 tablet (250 mg total) by mouth daily. Take first 2 tablets together, then 1 every day until finished.   BENZONATATE (TESSALON) 100 MG CAPSULE    Take 1 capsule (100 mg total) by mouth every 8 (eight) hours.     Deno Etienne, DO 09/03/16 1121

## 2016-09-03 NOTE — Discharge Instructions (Signed)
Take 4 over the counter ibuprofen tablets 3 times a day or 2 over-the-counter naproxen tablets twice a day for pain. Also take tylenol 1000mg(2 extra strength) four times a day.    

## 2016-09-03 NOTE — ED Triage Notes (Signed)
Patient endorses two weeks of fatigue, non-productive cough and central chest pain. Patient states has been taking OTC medications for cough and ibuprofen with no relief of symptoms. Patient endorses mild shortness of breath noted over the past few days. Patient states pain is worse with cough and bending forward. Patient denies lightheadedness, dizziness, LOC, fevers, chills.

## 2016-10-04 ENCOUNTER — Encounter (HOSPITAL_COMMUNITY): Payer: Self-pay | Admitting: *Deleted

## 2016-10-04 ENCOUNTER — Emergency Department (HOSPITAL_COMMUNITY)
Admission: EM | Admit: 2016-10-04 | Discharge: 2016-10-04 | Disposition: A | Discharge status: Home / Self Care | Payer: PRIVATE HEALTH INSURANCE | Attending: Emergency Medicine | Admitting: Emergency Medicine

## 2016-10-04 DIAGNOSIS — J029 Acute pharyngitis, unspecified: Secondary | ICD-10-CM | POA: Insufficient documentation

## 2016-10-04 DIAGNOSIS — I10 Essential (primary) hypertension: Secondary | ICD-10-CM | POA: Diagnosis not present

## 2016-10-04 DIAGNOSIS — Z79899 Other long term (current) drug therapy: Secondary | ICD-10-CM | POA: Diagnosis not present

## 2016-10-04 DIAGNOSIS — R070 Pain in throat: Secondary | ICD-10-CM | POA: Diagnosis present

## 2016-10-04 MED ORDER — DIPHENHYDRAMINE HCL 25 MG PO CAPS
25.0000 mg | ORAL_CAPSULE | Freq: Once | ORAL | Status: AC
  Administered 2016-10-04: 25 mg via ORAL
  Filled 2016-10-04: qty 1

## 2016-10-04 MED ORDER — IBUPROFEN 400 MG PO TABS
400.0000 mg | ORAL_TABLET | Freq: Once | ORAL | Status: AC
  Administered 2016-10-04: 400 mg via ORAL
  Filled 2016-10-04: qty 1

## 2016-10-04 MED ORDER — PHENOL 1.4 % MT LIQD: 1.0000 | OROMUCOSAL | 0 refills | Status: DC | PRN

## 2016-10-04 NOTE — Discharge Instructions (Signed)
Use Chloraseptic spray as needed for throat pain. You may take Benadryl tablet (25 mg) twice a day if throat continues to feel swollen and itchy/scratch. It is very important that you stay well-hydrated injuring lots of water. Follow-up with [facility] in one week if your symptoms continue. Return to the emergency department if you develop tongue swelling, difficulty breathing, inability to swallow anything, or any new or worsening symptoms.

## 2016-10-04 NOTE — ED Notes (Signed)
Pt states she drank flak seed oil this am  And it made her throat sore and burn, she al;so reports abd pain but states that  She  is on  her period and it hurts  anyway

## 2016-10-04 NOTE — ED Provider Notes (Signed)
[facility] DEPT Provider Note   CSN: [id number] Arrival date & time: 10/04/16  2119  By signing my name below, I, Ephriam Jenkins, attest that this documentation has been prepared under the direction and in the presence of Shahmeer Bunn PA-C.  Electronically Signed: Ephriam Jenkins, ED Scribe. 10/04/16. 9:32 AM.   History   Chief Complaint Chief Complaint  Patient presents with  . Allergic Reaction    HPI HPI Comments: Sherri [name] is a 31 y.o. female who presents to the Emergency Department complaining of possible allergic reaction that occurred approximately one hour ago. Pt drank some flax seed oil today for the first time and now has a burning pain in her throat. Pt reports pain to the back of her throat and describes it as a "soreness". She has never tried flax seed oil before. She reports a burning pain in her mouth which has improved since she drank it. She has not had anything to eat or drink since the pain started. Pt reports that she is currently on her menstrual period and reports associated abdominal cramping that is similar to her normal periods, which is normally controlled with midol. Pt has not taken any medications prior to arrival. No chest pain, shortness of breath, nausea, vomiting, vision changes, abdominal pain. She denies lip or tongue swelling. She denies difficulty handling her secretions.  The history is provided by the patient. No language interpreter was used.    Past Medical History:  Diagnosis Date  . Fibromyalgia    denies today  . Hypertension   . 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 Urinary tract infection   . Uterine fibroid     Patient Active Problem List   Diagnosis Date Noted  . Status post cesarean delivery 06/06/2014  . Uterine fibroid 12/25/2013  . Obesity 12/25/2013    Past Surgical History:  Procedure Laterality Date  . CESAREAN SECTION N/A 06/05/2014   Procedure: CESAREAN SECTION;  Surgeon: Truett Mainland, DO;  Location: [facility];  Service: Obstetrics;  Laterality: N/A;  . WISDOM TOOTH EXTRACTION      OB History    Gravida Para Term Preterm AB Living   1 1   1   2    SAB TAB Ectopic Multiple Live Births         1 2       Home Medications    Prior to Admission medications   Medication Sig Start Date End Date Taking? Authorizing Provider  azithromycin (ZITHROMAX) 250 MG tablet Take 1 tablet (250 mg total) by mouth daily. Take first 2 tablets together, then 1 every day until finished. 09/03/16   Deno Etienne, DO  benzonatate (TESSALON) 100 MG capsule Take 1 capsule (100 mg total) by mouth every 8 (eight) hours. 09/03/16   Deno Etienne, DO  cetirizine (ZYRTEC ALLERGY) 10 MG tablet Take 1 tablet (10 mg total) by mouth daily. 08/19/16   Waynetta Pean, PA-C  diclofenac sodium (VOLTAREN) 1 % GEL Apply 1 application topically 4 (four) times daily. 06/22/16   Janne Napoleon, NP  fluticasone (FLONASE) 50 MCG/ACT nasal spray Place 2 sprays into both nostrils daily. 08/19/16   Waynetta Pean, PA-C  naproxen (NAPROSYN) 375 MG tablet Take 1 tablet (375 mg total) by mouth 2 (two) times daily. 06/22/16   Janne Napoleon, NP  phenol (CHLORASEPTIC) 1.4 % LIQD Use as directed 1 spray in the mouth or throat as needed for throat irritation / pain. 10/04/16   Kathaleen Dudziak, PA-C  predniSONE (DELTASONE) 50 MG tablet Take 1 tablet (50 mg total) by mouth daily with breakfast. 07/14/16   Dalia Heading, PA-C    Family History Family History  Problem Relation Age of Onset  . Colon cancer Neg Hx   . Colon polyps Neg Hx   . Kidney disease Neg Hx   . Diabetes Neg Hx   . Esophageal cancer Neg Hx   . Gallbladder disease Neg Hx   . Heart disease Neg Hx   . Asthma Neg Hx   . Cancer Neg Hx   . Stroke Neg Hx     Social History Social History  Substance Use Topics  . Smoking status: Never Smoker  . Smokeless tobacco: Never Used  . Alcohol use No     Comment: occasional when not pregnant     Allergies     Zyrtec [cetirizine]   Review of Systems Review of Systems  Constitutional: Negative for chills and fever.  HENT: Positive for sore throat. Negative for trouble swallowing.   Eyes: Negative for visual disturbance.  Respiratory: Negative for cough, choking and shortness of breath.   Gastrointestinal: Negative for abdominal pain, diarrhea, nausea and vomiting.     Physical Exam Updated Vital Signs BP 137/84 (BP Location: Left Arm)   Pulse 80   Temp 98.5 F (36.9 C) (Oral)   Resp 18   LMP 10/04/2016   SpO2 98%   Physical Exam  Constitutional: She is oriented to person, place, and time. She appears well-developed and well-nourished. No distress.  HENT:  Head: Normocephalic and atraumatic.  Right Ear: Tympanic membrane, external ear and ear canal normal.  Left Ear: Tympanic membrane, external ear and ear canal normal.  Nose: Nose normal.  Mouth/Throat: Uvula is midline, oropharynx is clear and moist and mucous membranes are normal.  No lip or tongue swelling. No obvious throat swelling. Patient handling secretions well.  Eyes: EOM are normal.  Neck: Normal range of motion.  No obvious swelling of the neck or throat.  Cardiovascular: Normal rate, regular rhythm and intact distal pulses.   Pulmonary/Chest: Effort normal and breath sounds normal. No respiratory distress.  Patient without any obvious signs of difficulty breathing or respiratory distress. Good air movement in all lung fields.  Abdominal: Soft. She exhibits no distension.  Abdominal soreness, which patient reports is baseline with menstrual cramps.  Musculoskeletal: Normal range of motion.  Neurological: She is alert and oriented to person, place, and time.  Skin: Skin is warm and dry. She is not diaphoretic.  Psychiatric: She has a normal mood and affect.  Nursing note and vitals reviewed.    ED Treatments / Results  DIAGNOSTIC STUDIES: Oxygen Saturation is 98% on RA, normal by my  interpretation.  COORDINATION OF CARE: 9:26 AM-Discussed treatment plan with pt at bedside and pt agreed to plan.   Labs (all labs ordered are listed, but only abnormal results are displayed) Labs Reviewed - No data to display  EKG  EKG Interpretation None       Radiology No results found.  Procedures Procedures (including critical care time)  Medications Ordered in ED Medications  diphenhydrAMINE (BENADRYL) capsule 25 mg (25 mg Oral Given 10/04/16 0939)  ibuprofen (ADVIL,MOTRIN) tablet 400 mg (400 mg Oral Given 10/04/16 0939)     Initial Impression / Assessment and Plan / ED Course  I have reviewed the triage vital signs and the nursing notes.  Pertinent labs & imaging results that were available during my care of the patient were  reviewed by me and considered in my medical decision making (see chart for details).     Patient presented with concern for sore throat after drinking flaxseed oil. Physical exam reassuring as patient is not in any respiratory distress, having no difficulty breathing, no obvious swelling of the lips, tongue, or throat. Will give Benadryl for symptom relief, ensure patient can tolerate PO intake, and give dose of ibuprofen for menstrual cramps.  Patient able to tolerate PO intake without difficulty. Will discharge with instructions to continue taking Benadryl if symptoms persist, and Chloraseptic spray for throat soreness. Patient to avoid drinking flaxseed in the future. She will continue to manage her menstrual cramps at home as per usual. At this time, doubt anaphylactic reaction or respiratory compromise. Patient is safe for discharge. She did return precautions given. Patient states she understands and agrees to plan.   Final Clinical Impressions(s) / ED Diagnoses   Final diagnoses:  Sore throat    New Prescriptions New Prescriptions   PHENOL (CHLORASEPTIC) 1.4 % LIQD    Use as directed 1 spray in the mouth or throat as needed for throat  irritation / pain.  I personally performed the services described in this documentation, which was scribed in my presence. The recorded information has been reviewed and is accurate.     Franchot Heidelberg, PA-C 10/04/16 1033    Malvin Johns, MD 10/04/16 1531

## 2016-10-04 NOTE — ED Triage Notes (Signed)
Pt reports drinking flax seed oil today for first time and now has burning pain to her throat. Denies any rash or other symptoms. Did not take any benadryl pta. Airway is intact at triage.

## 2016-10-27 ENCOUNTER — Encounter (HOSPITAL_COMMUNITY): Payer: Self-pay | Admitting: Emergency Medicine

## 2016-10-27 DIAGNOSIS — R1084 Generalized abdominal pain: Secondary | ICD-10-CM | POA: Diagnosis present

## 2016-10-27 DIAGNOSIS — R112 Nausea with vomiting, unspecified: Secondary | ICD-10-CM | POA: Diagnosis not present

## 2016-10-27 DIAGNOSIS — R197 Diarrhea, unspecified: Secondary | ICD-10-CM | POA: Diagnosis not present

## 2016-10-27 LAB — COMPREHENSIVE METABOLIC PANEL
ALT: 14 U/L (ref 14–54)
AST: 21 U/L (ref 15–41)
Albumin: 4.2 g/dL (ref 3.5–5.0)
Alkaline Phosphatase: 75 U/L (ref 38–126)
Anion gap: 7 (ref 5–15)
BUN: 11 mg/dL (ref 6–20)
CO2: 23 mmol/L (ref 22–32)
Calcium: 9.3 mg/dL (ref 8.9–10.3)
Chloride: 107 mmol/L (ref 101–111)
Creatinine, Ser: 0.79 mg/dL (ref 0.44–1.00)
GFR calc Af Amer: >60 mL/min (ref >60)
GFR calc non Af Amer: >60 mL/min (ref >60)
Glucose, Bld: 125 mg/dL — ABNORMAL HIGH (ref 65–99)
Potassium: 3.5 mmol/L (ref 3.5–5.1)
Sodium: 137 mmol/L (ref 135–145)
Total Bilirubin: 0.3 mg/dL (ref 0.3–1.2)
Total Protein: 7.7 g/dL (ref 6.5–8.1)

## 2016-10-27 LAB — CBC
HCT: 38.9 % (ref 36.0–46.0)
Hemoglobin: 13 g/dL (ref 12.0–15.0)
MCH: 29 pg (ref 26.0–34.0)
MCHC: 33.4 g/dL (ref 30.0–36.0)
MCV: 86.6 fL (ref 78.0–100.0)
Platelets: 222 10*3/uL (ref 150–400)
RBC: 4.49 MIL/uL (ref 3.87–5.11)
RDW: 14.4 % (ref 11.5–15.5)
WBC: 6.5 10*3/uL (ref 4.0–10.5)

## 2016-10-27 LAB — LIPASE, BLOOD: Lipase: 32 U/L (ref 11–51)

## 2016-10-27 NOTE — ED Triage Notes (Signed)
Pt from home with complaints of generalized abdominal pain x 2 days. Pt reports "many" episodes of emesis and of diarrhea. Pt also reports frequent and painful urination. Pt states she recently began a new antibiotic for her skin prescribed by her dermatologist.

## 2016-10-28 ENCOUNTER — Emergency Department (HOSPITAL_COMMUNITY)
Admission: EM | Admit: 2016-10-28 | Discharge: 2016-10-28 | Disposition: A | Discharge status: Home / Self Care | Payer: PRIVATE HEALTH INSURANCE | Attending: Emergency Medicine | Admitting: Emergency Medicine

## 2016-10-28 DIAGNOSIS — R197 Diarrhea, unspecified: Secondary | ICD-10-CM

## 2016-10-28 DIAGNOSIS — R112 Nausea with vomiting, unspecified: Secondary | ICD-10-CM

## 2016-10-28 LAB — URINALYSIS, ROUTINE W REFLEX MICROSCOPIC
Bilirubin Urine: NEGATIVE
Glucose, UA: NEGATIVE mg/dL
Hgb urine dipstick: NEGATIVE
Ketones, ur: NEGATIVE mg/dL
Nitrite: NEGATIVE
Protein, ur: NEGATIVE mg/dL
Specific Gravity, Urine: 1.017 (ref 1.005–1.030)
pH: 5 (ref 5.0–8.0)

## 2016-10-28 LAB — POC URINE PREG, ED: Preg Test, Ur: NEGATIVE

## 2016-10-28 MED ORDER — IBUPROFEN 800 MG PO TABS
800.0000 mg | ORAL_TABLET | Freq: Once | ORAL | Status: AC
  Administered 2016-10-28: 800 mg via ORAL
  Filled 2016-10-28: qty 1

## 2016-10-28 MED ORDER — DICYCLOMINE HCL 20 MG PO TABS: 20.0000 mg | ORAL_TABLET | Freq: Three times a day (TID) | ORAL | 0 refills | Status: DC

## 2016-10-28 MED ORDER — ONDANSETRON 4 MG PO TBDP: 4.0000 mg | ORAL_TABLET | Freq: Three times a day (TID) | ORAL | 0 refills | Status: DC | PRN

## 2016-10-28 MED ORDER — ONDANSETRON 4 MG PO TBDP
4.0000 mg | ORAL_TABLET | Freq: Once | ORAL | Status: AC
  Administered 2016-10-28: 4 mg via ORAL
  Filled 2016-10-28: qty 1

## 2016-10-28 MED ORDER — LOPERAMIDE HCL 2 MG PO CAPS
4.0000 mg | ORAL_CAPSULE | Freq: Once | ORAL | Status: AC
  Administered 2016-10-28: 4 mg via ORAL
  Filled 2016-10-28: qty 2

## 2016-10-28 MED ORDER — LOPERAMIDE HCL 2 MG PO CAPS: 2.0000 mg | ORAL_CAPSULE | Freq: Four times a day (QID) | ORAL | 0 refills | Status: DC | PRN

## 2016-10-28 MED ORDER — DICYCLOMINE HCL 10 MG/ML IM SOLN
20.0000 mg | Freq: Once | INTRAMUSCULAR | Status: AC
  Administered 2016-10-28: 20 mg via INTRAMUSCULAR
  Filled 2016-10-28: qty 2

## 2016-10-28 NOTE — ED Provider Notes (Signed)
TIME SEEN: 1:05 AM  CHIEF COMPLAINT: Abdominal pain  HPI: Patient is a 31 year old female who presents to the emergency department with complaints of several days of nausea, vomiting and diarrhea. Has also had some dysuria. Denies fevers, chills. Has noticed streaks of blood in her vomit tonight. No bloody stool or melena. No dysuria, hematuria, vaginal bleeding or discharge. No regular alcohol use or NSAID use. No previous abdominal surgery. States that she thinks this may be related to doxycycline that she was put on by her dermatologist to help "clear my skin".  ROS: See HPI Constitutional: no fever  Eyes: no drainage  ENT: no runny nose   Cardiovascular:  no chest pain  Resp: no SOB  GI: Vomiting and diarrhea GU: no dysuria Integumentary: no rash  Allergy: no hives  Musculoskeletal: no leg swelling  Neurological: no slurred speech ROS otherwise negative  PAST MEDICAL HISTORY/PAST SURGICAL HISTORY:  Past Medical History:  Diagnosis Date  . Fibromyalgia    denies today  . Hypertension   . 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 Urinary tract infection   . Uterine fibroid     MEDICATIONS:  Prior to Admission medications   Medication Sig Start Date End Date Taking? Authorizing Provider  azithromycin (ZITHROMAX) 250 MG tablet Take 1 tablet (250 mg total) by mouth daily. Take first 2 tablets together, then 1 every day until finished. 09/03/16   Deno Etienne, DO  benzonatate (TESSALON) 100 MG capsule Take 1 capsule (100 mg total) by mouth every 8 (eight) hours. 09/03/16   Deno Etienne, DO  cetirizine (ZYRTEC ALLERGY) 10 MG tablet Take 1 tablet (10 mg total) by mouth daily. 08/19/16   Waynetta Pean, PA-C  diclofenac sodium (VOLTAREN) 1 % GEL Apply 1 application topically 4 (four) times daily. 06/22/16   Janne Napoleon, NP  fluticasone (FLONASE) 50 MCG/ACT nasal spray Place 2 sprays into both nostrils daily. 08/19/16   Waynetta Pean, PA-C  naproxen (NAPROSYN) 375 MG  tablet Take 1 tablet (375 mg total) by mouth 2 (two) times daily. 06/22/16   Janne Napoleon, NP  phenol (CHLORASEPTIC) 1.4 % LIQD Use as directed 1 spray in the mouth or throat as needed for throat irritation / pain. 10/04/16   Caccavale, Sophia, PA-C  predniSONE (DELTASONE) 50 MG tablet Take 1 tablet (50 mg total) by mouth daily with breakfast. 07/14/16   Lawyer, Harrell Gave, PA-C    ALLERGIES:  Allergies  Allergen Reactions  . Zyrtec [Cetirizine] Rash    SOCIAL HISTORY:  Social History  Substance Use Topics  . Smoking status: Never Smoker  . Smokeless tobacco: Never Used  . Alcohol use No     Comment: occasional when not pregnant    FAMILY HISTORY: Family History  Problem Relation Age of Onset  . Colon cancer Neg Hx   . Colon polyps Neg Hx   . Kidney disease Neg Hx   . Diabetes Neg Hx   . Esophageal cancer Neg Hx   . Gallbladder disease Neg Hx   . Heart disease Neg Hx   . Asthma Neg Hx   . Cancer Neg Hx   . Stroke Neg Hx     EXAM: BP (!) 145/104 (BP Location: Left Arm)   Pulse 98   Temp 98.8 F (37.1 C) (Oral)   Resp 18   Ht 5\' 3"  (1.6 m)   Wt 77.1 kg (170 lb)   LMP 10/04/2016   SpO2 99%   BMI 30.11 kg/m  CONSTITUTIONAL: Alert  and oriented and responds appropriately to questions. Well-appearing; well-nourished HEAD: Normocephalic EYES: Conjunctivae clear, pupils appear equal, EOMI ENT: normal nose; moist mucous membranes NECK: Supple, no meningismus, no nuchal rigidity, no LAD  CARD: RRR; S1 and S2 appreciated; no murmurs, no clicks, no rubs, no gallops RESP: Normal chest excursion without splinting or tachypnea; breath sounds clear and equal bilaterally; no wheezes, no rhonchi, no rales, no hypoxia or respiratory distress, speaking full sentences ABD/GI: Normal bowel sounds; non-distended; soft, non-tender, no rebound, no guarding, no peritoneal signs, no hepatosplenomegaly BACK:  The back appears normal and is non-tender to palpation, there is no CVA  tenderness EXT: Normal ROM in all joints; non-tender to palpation; no edema; normal capillary refill; no cyanosis, no calf tenderness or swelling    SKIN: Normal color for age and race; warm; no rash NEURO: Moves all extremities equally PSYCH: The patient's mood and manner are appropriate. Grooming and personal hygiene are appropriate.  MEDICAL DECISION MAKING: Patient here with benign abdominal exam with vomiting and diarrhea. Suspect viral gastroenteritis. She is very well-appearing and appears well-hydrated. Labs unremarkable. Pregnancy test negative. Urine shows no sign of infection and appears to be a dirty sample. Pain improved after ibuprofen, Bentyl, Zofran, Imodium. She is drinking without difficulty. She thinks that this is related to doxycycline that her dermatologist started her on. She will contact her dermatologist to see if she can stop this medication. Doubt C. difficile. She has not had any diarrhea here in the emergency department. Given outpatient follow-up. Discharge with Bentyl, Zofran, Imodium. Doubt appendicitis, colitis, diverticulitis, bowel obstruction, cholecystitis, pancreatitis. I do not feel she needs emergent abdominal imaging.  Patient had an oxygen saturation of 82% documented upon discharge. This was erroneous. Patient had no hypoxia at all during her emergency department stay.   At this time, I do not feel there is any life-threatening condition present. I have reviewed and discussed all results (EKG, imaging, lab, urine as appropriate) and exam findings with patient/family. I have reviewed nursing notes and appropriate previous records.  I feel the patient is safe to be discharged home without further emergent workup and can continue workup as an outpatient as needed. Discussed usual and customary return precautions. Patient/family verbalize understanding and are comfortable with this plan.  Outpatient follow-up has been provided if needed. All questions have been  answered.      Ward, Delice Bison, DO 10/28/16 [id number]

## 2016-10-28 NOTE — Discharge Instructions (Signed)
You may alternate Tylenol 1000 mg every 6 hours as needed for pain and Ibuprofen 800 mg every 8 hours as needed for pain.  Please take Ibuprofen with food. ° °

## 2016-10-28 NOTE — ED Notes (Signed)
Called  No response from lobby 

## 2016-11-25 ENCOUNTER — Encounter (HOSPITAL_COMMUNITY): Payer: Self-pay | Admitting: Emergency Medicine

## 2016-11-25 ENCOUNTER — Ambulatory Visit (HOSPITAL_COMMUNITY)
Admission: EM | Admit: 2016-11-25 | Discharge: 2016-11-25 | Disposition: A | Discharge status: Home / Self Care | Payer: PRIVATE HEALTH INSURANCE | Attending: Family Medicine | Admitting: Family Medicine

## 2016-11-25 DIAGNOSIS — D259 Leiomyoma of uterus, unspecified: Secondary | ICD-10-CM | POA: Diagnosis not present

## 2016-11-25 DIAGNOSIS — E669 Obesity, unspecified: Secondary | ICD-10-CM | POA: Insufficient documentation

## 2016-11-25 DIAGNOSIS — I10 Essential (primary) hypertension: Secondary | ICD-10-CM | POA: Insufficient documentation

## 2016-11-25 DIAGNOSIS — Z888 Allergy status to other drugs, medicaments and biological substances status: Secondary | ICD-10-CM | POA: Diagnosis not present

## 2016-11-25 DIAGNOSIS — N898 Other specified noninflammatory disorders of vagina: Secondary | ICD-10-CM | POA: Diagnosis not present

## 2016-11-25 DIAGNOSIS — R109 Unspecified abdominal pain: Secondary | ICD-10-CM

## 2016-11-25 DIAGNOSIS — R102 Pelvic and perineal pain: Secondary | ICD-10-CM | POA: Insufficient documentation

## 2016-11-25 DIAGNOSIS — R110 Nausea: Secondary | ICD-10-CM | POA: Diagnosis not present

## 2016-11-25 DIAGNOSIS — Z113 Encounter for screening for infections with a predominantly sexual mode of transmission: Secondary | ICD-10-CM

## 2016-11-25 DIAGNOSIS — Z79899 Other long term (current) drug therapy: Secondary | ICD-10-CM | POA: Diagnosis not present

## 2016-11-25 DIAGNOSIS — R350 Frequency of micturition: Secondary | ICD-10-CM | POA: Diagnosis not present

## 2016-11-25 DIAGNOSIS — Z9889 Other specified postprocedural states: Secondary | ICD-10-CM | POA: Diagnosis not present

## 2016-11-25 DIAGNOSIS — R300 Dysuria: Secondary | ICD-10-CM | POA: Diagnosis not present

## 2016-11-25 DIAGNOSIS — R197 Diarrhea, unspecified: Secondary | ICD-10-CM | POA: Diagnosis not present

## 2016-11-25 DIAGNOSIS — M797 Fibromyalgia: Secondary | ICD-10-CM | POA: Diagnosis not present

## 2016-11-25 DIAGNOSIS — Z3202 Encounter for pregnancy test, result negative: Secondary | ICD-10-CM

## 2016-11-25 LAB — POCT URINALYSIS DIP (DEVICE)
Bilirubin Urine: NEGATIVE
Glucose, UA: NEGATIVE mg/dL
Ketones, ur: NEGATIVE mg/dL
Leukocytes, UA: NEGATIVE
Nitrite: NEGATIVE
Protein, ur: NEGATIVE mg/dL
Specific Gravity, Urine: >=1.03 (ref 1.005–1.030)
Urobilinogen, UA: 0.2 mg/dL (ref 0.0–1.0)
pH: 5.5 (ref 5.0–8.0)

## 2016-11-25 LAB — POCT PREGNANCY, URINE: Preg Test, Ur: NEGATIVE

## 2016-11-25 MED ORDER — AZITHROMYCIN 250 MG PO TABS
1000.0000 mg | ORAL_TABLET | Freq: Once | ORAL | Status: AC
  Administered 2016-11-25: 1000 mg via ORAL

## 2016-11-25 MED ORDER — STERILE WATER FOR INJECTION IJ SOLN
INTRAMUSCULAR | Status: AC
  Filled 2016-11-25: qty 10

## 2016-11-25 MED ORDER — AZITHROMYCIN 250 MG PO TABS
ORAL_TABLET | ORAL | Status: AC
  Filled 2016-11-25: qty 4

## 2016-11-25 MED ORDER — CEFTRIAXONE SODIUM 250 MG IJ SOLR
250.0000 mg | Freq: Once | INTRAMUSCULAR | Status: AC
  Administered 2016-11-25: 250 mg via INTRAMUSCULAR

## 2016-11-25 MED ORDER — CEFTRIAXONE SODIUM 250 MG IJ SOLR
INTRAMUSCULAR | Status: AC
  Filled 2016-11-25: qty 250

## 2016-11-25 NOTE — Discharge Instructions (Signed)
You were treated empirically for gonorrhea and Chlamydia. Cytology sent, you will be contacted with any positive results that requires further treatment. Refrain from sexual activity for the next 7 days. Your symptoms could also be due to onset of menstrual cycle. As we discussed, cannot rule out pelvic inflammatory disease, ovarian causes for your symptoms. If experiencing any worsening of symptoms, fever, abdominal pain, nausea, vomiting, to go to the emergency department or women's hospital for further evaluation.

## 2016-11-25 NOTE — ED Provider Notes (Signed)
[place]    CSN: [id number] Arrival date & time: 11/25/16  1051     History   Chief Complaint Chief Complaint  Patient presents with  . Vaginal Pain    HPI Sherri [name] is a 31 y.o. female.   31 year old female with history of fibromyalgia and uterine fibroids comes in for 3 day history of vaginal soreness/burning/itchiness with onset of menstrual cycle. Denies vaginal discharge. Cycle is similar to the ones she has in the past. Has also experienced urinary frequency, dysuria. Abdominal cramping that is consistent with her cycle. Has had some nausea that is not what she usually has during cycles, no vomiting. Has had some diarrhea, which is consistent with her cycles. Denies fever, chills, night sweats. Sexually active with 1 partner, no condom use. No other forms of birth control use. No shaving, denies new exposure.       Past Medical History:  Diagnosis Date  . Fibromyalgia    denies today  . Hypertension   . 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 Urinary tract infection   . Uterine fibroid     Patient Active Problem List   Diagnosis Date Noted  . Status post cesarean delivery 06/06/2014  . Uterine fibroid 12/25/2013  . Obesity 12/25/2013    Past Surgical History:  Procedure Laterality Date  . CESAREAN SECTION N/A 06/05/2014   Procedure: CESAREAN SECTION;  Surgeon: Truett Mainland, DO;  Location: [facility];  Service: Obstetrics;  Laterality: N/A;  . WISDOM TOOTH EXTRACTION      OB History    Gravida Para Term Preterm AB Living   1 1   1   2    SAB TAB Ectopic Multiple Live Births         1 2       Home Medications    Prior to Admission medications   Not on File    Family History Family History  Problem Relation Age of Onset  . Colon cancer Neg Hx   . Colon polyps Neg Hx   . Kidney disease Neg Hx   . Diabetes Neg Hx   . Esophageal cancer Neg Hx   . Gallbladder disease Neg Hx   . Heart disease Neg Hx   .  Asthma Neg Hx   . Cancer Neg Hx   . Stroke Neg Hx     Social History Social History  Substance Use Topics  . Smoking status: Never Smoker  . Smokeless tobacco: Never Used  . Alcohol use No     Comment: occasional when not pregnant     Allergies   Zyrtec [cetirizine]   Review of Systems Review of Systems  Reason unable to perform ROS: See HPI as above.     Physical Exam Triage Vital Signs ED Triage Vitals  Enc Vitals Group     BP 11/25/16 1203 130/60     Pulse Rate 11/25/16 1203 70     Resp 11/25/16 1203 18     Temp 11/25/16 1203 98.1 F (36.7 C)     Temp Source 11/25/16 1203 Oral     SpO2 11/25/16 1203 100 %     Weight --      Height --      Head Circumference --      Peak Flow --      Pain Score 11/25/16 1201 9     Pain Loc --      Pain Edu? --  Excl. in [place]? --    No data found.   Updated Vital Signs BP 130/60 (BP Location: Right Arm)   Pulse 70   Temp 98.1 F (36.7 C) (Oral)   Resp 18   LMP 11/23/2016   SpO2 100%     Physical Exam  Constitutional: She is oriented to person, place, and time. She appears well-developed and well-nourished. No distress.  HENT:  Head: Normocephalic and atraumatic.  Eyes: Pupils are equal, round, and reactive to light. Conjunctivae are normal.  Cardiovascular: Normal rate, regular rhythm and normal heart sounds.  Exam reveals no gallop and no friction rub.   No murmur heard. Pulmonary/Chest: Effort normal and breath sounds normal. She has no wheezes. She has no rales.  Abdominal: Soft. Bowel sounds are normal. She exhibits no mass. There is tenderness (generlized tenderness). There is no rebound, no guarding and no CVA tenderness.  Genitourinary: There is tenderness on the right labia. There is no rash on the right labia. There is tenderness on the left labia. There is no rash on the left labia. Cervix exhibits no motion tenderness and no friability. There is bleeding in the vagina.  Genitourinary Comments: No  cervical motion tenderness. Tender on palpation around uterus/bilateral adnexa area, but similar to abdominal exam.   Neurological: She is alert and oriented to person, place, and time.  Skin: Skin is warm and dry.  Psychiatric: She has a normal mood and affect. Her behavior is normal. Judgment normal.     UC Treatments / Results  Labs (all labs ordered are listed, but only abnormal results are displayed) Labs Reviewed  POCT URINALYSIS DIP (DEVICE) - Abnormal; Notable for the following:       Result Value   Hgb urine dipstick LARGE (*)    All other components within normal limits  POCT PREGNANCY, URINE  CERVICOVAGINAL ANCILLARY ONLY    EKG  EKG Interpretation None       Radiology No results found.  Procedures Procedures (including critical care time)  Medications Ordered in UC Medications  cefTRIAXone (ROCEPHIN) injection 250 mg (250 mg Intramuscular Given 11/25/16 1310)  azithromycin (ZITHROMAX) tablet 1,000 mg (1,000 mg Oral Given 11/25/16 1309)     Initial Impression / Assessment and Plan / UC Course  I have reviewed the triage vital signs and the nursing notes.  Pertinent labs & imaging results that were available during my care of the patient were reviewed by me and considered in my medical decision making (see chart for details).    Pelvic exam inconsistent with PID, patient with abdominal cramping consistent with her normal menstrual cycle cramping. Patient was treated empirically for Gonorrhea and Chlamydia. Rocephin and Azithromycin given in office today. Cytology sent, patient will be contacted with any positive results that require additional treatment. Patient to refrain from sexual activity for the next 7 days. Return precautions given.    Final Clinical Impressions(s) / UC Diagnoses   Final diagnoses:  Vaginal irritation    New Prescriptions Discharge Medication List as of 11/25/2016 12:52 PM        Ok Edwards, PA-C 11/25/16 1757

## 2016-11-25 NOTE — ED Notes (Signed)
Sent to the bathroom for a dirty and clean urine specimen, with instructions

## 2016-11-25 NOTE — ED Triage Notes (Signed)
Vaginal pain, burning and itching that started 2 days ago.  Patient reports her menstrual cycle started at the same time.

## 2016-11-26 LAB — CERVICOVAGINAL ANCILLARY ONLY
Bacterial vaginitis: NEGATIVE
Candida vaginitis: NEGATIVE
Chlamydia: NEGATIVE
Neisseria Gonorrhea: NEGATIVE
Trichomonas: NEGATIVE

## 2016-12-03 ENCOUNTER — Encounter: Payer: Self-pay | Admitting: Student

## 2016-12-03 ENCOUNTER — Ambulatory Visit (INDEPENDENT_AMBULATORY_CARE_PROVIDER_SITE_OTHER): Payer: PRIVATE HEALTH INSURANCE | Admitting: Student

## 2016-12-03 VITALS — BP 140/61 | HR 76 | Ht 60.0 in | Wt 171.7 lb

## 2016-12-03 DIAGNOSIS — B379 Candidiasis, unspecified: Secondary | ICD-10-CM | POA: Insufficient documentation

## 2016-12-03 DIAGNOSIS — Z113 Encounter for screening for infections with a predominantly sexual mode of transmission: Secondary | ICD-10-CM | POA: Diagnosis not present

## 2016-12-03 DIAGNOSIS — R102 Pelvic and perineal pain: Secondary | ICD-10-CM | POA: Diagnosis not present

## 2016-12-03 DIAGNOSIS — B373 Candidiasis of vulva and vagina: Secondary | ICD-10-CM

## 2016-12-03 MED ORDER — FLUCONAZOLE 150 MG PO TABS: 150.0000 mg | ORAL_TABLET | Freq: Once | ORAL | 2 refills | Status: AC

## 2016-12-03 NOTE — Progress Notes (Signed)
Subjective:     Patient ID: Sherri [name], female   DOB: Aug 04, 1985, 31 y.o.   MRN: [id number] Patient is a 31 y.o. [id number] who went to [facility] urgent care on Wednesday, Sept 26, 2018 and was treated for gonorrhea and chlamydia. She says that they never told her if her results were positive or negative but that they treated her as a precaution.    She has had burning, itching and pain with urination for a week. The treatments urgent care gave her did not help.   Patient is on a 3 month course of antibiotics for acne treatment; she thinks her yeast infection could be due to antibiotics.  Vaginal Pain  The patient's primary symptoms include genital itching and a genital odor. This is a new problem. The current episode started in the past 7 days. The problem occurs intermittently. The problem has been unchanged. The pain is mild (some pain she describes as burning). Associated symptoms include back pain and dysuria. Associated symptoms comments: Patient has been taking minocycline for a month.     Review of Systems  HENT: Negative.   Respiratory: Negative.   Cardiovascular: Negative.   Gastrointestinal: Negative.   Genitourinary: Positive for dysuria and vaginal pain.  Musculoskeletal: Positive for back pain.  Neurological: Negative.   Psychiatric/Behavioral: Negative.        Objective:   Physical Exam  Constitutional: She is oriented to person, place, and time. She appears well-developed.  HENT:  Head: Normocephalic.  Neck: Normal range of motion.  Pulmonary/Chest: Effort normal.  Abdominal: Soft.  Genitourinary:  Genitourinary Comments: NEFG; patient has large clumpy white discharge in the vagina. No erythema, no CMT, no odor or lesions on vaginal walls or cervix.   Musculoskeletal: Normal range of motion.  Neurological: She is alert and oriented to person, place, and time. She has normal reflexes.       Assessment:     Yeast infection, most likely due to antibiotic use.   Possible UTI.     Plan:     1. Urine culture pending 2. Wet prep pending 3. Will treat for yeast with Diflucan x 2 refills to cover her course of antibiotics.   Maye Hides CNM

## 2016-12-03 NOTE — Patient Instructions (Signed)

## 2016-12-04 LAB — CERVICOVAGINAL ANCILLARY ONLY
Bacterial vaginitis: NEGATIVE
Candida vaginitis: NEGATIVE
Chlamydia: NEGATIVE
Neisseria Gonorrhea: NEGATIVE
Trichomonas: NEGATIVE

## 2016-12-05 LAB — URINE CULTURE

## 2016-12-16 ENCOUNTER — Encounter (HOSPITAL_COMMUNITY): Payer: Self-pay | Admitting: Family Medicine

## 2016-12-16 ENCOUNTER — Telehealth (HOSPITAL_COMMUNITY): Payer: Self-pay | Admitting: Family Medicine

## 2016-12-16 ENCOUNTER — Ambulatory Visit (HOSPITAL_COMMUNITY)
Admission: EM | Admit: 2016-12-16 | Discharge: 2016-12-16 | Disposition: A | Discharge status: Home / Self Care | Payer: PRIVATE HEALTH INSURANCE | Attending: Family Medicine | Admitting: Family Medicine

## 2016-12-16 DIAGNOSIS — R0981 Nasal congestion: Secondary | ICD-10-CM | POA: Diagnosis present

## 2016-12-16 DIAGNOSIS — I10 Essential (primary) hypertension: Secondary | ICD-10-CM | POA: Insufficient documentation

## 2016-12-16 DIAGNOSIS — M797 Fibromyalgia: Secondary | ICD-10-CM | POA: Insufficient documentation

## 2016-12-16 DIAGNOSIS — H65192 Other acute nonsuppurative otitis media, left ear: Secondary | ICD-10-CM

## 2016-12-16 DIAGNOSIS — E669 Obesity, unspecified: Secondary | ICD-10-CM | POA: Diagnosis not present

## 2016-12-16 LAB — POCT RAPID STREP A: Streptococcus, Group A Screen (Direct): NEGATIVE

## 2016-12-16 MED ORDER — AMOXICILLIN 500 MG PO CAPS: 500.0000 mg | ORAL_CAPSULE | Freq: Two times a day (BID) | ORAL | 0 refills | Status: AC

## 2016-12-16 MED ORDER — AMOXICILLIN 500 MG PO CAPS
500.0000 mg | ORAL_CAPSULE | Freq: Two times a day (BID) | ORAL | 0 refills | Status: DC
Start: 2016-12-16 — End: 2016-12-16

## 2016-12-16 NOTE — ED Provider Notes (Signed)
[facility]    CSN: [id number] Arrival date & time: 12/16/16  1048     History   Chief Complaint Chief Complaint  Patient presents with  . Nasal Congestion    HPI Sherri [name] is a 31 y.o. female.   Patient is a 31yo F who presents to urgent care with congestion and sore throat. States started with stuffy nose and scratchy throat on Monday. Has been sneezing and having some itchy feeling but no rashes. States her biggest symptom is actually the sore throat and some associated ear pain. Her kids go to daycare so has had many sick contacts from going to pick them up. Does not have a cough or wheezing. No fever/chills. Does have a little nausea and diarrhea. No abdominal pain or urinary symptoms.      Past Medical History:  Diagnosis Date  . Fibromyalgia    denies today  . Hypertension   . 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 Urinary tract infection   . Uterine fibroid     Patient Active Problem List   Diagnosis Date Noted  . Yeast infection 12/03/2016  . Status post cesarean delivery 06/06/2014  . Uterine fibroid 12/25/2013  . Obesity 12/25/2013    Past Surgical History:  Procedure Laterality Date  . CESAREAN SECTION N/A 06/05/2014   Procedure: CESAREAN SECTION;  Surgeon: Truett Mainland, DO;  Location: [facility];  Service: Obstetrics;  Laterality: N/A;  . WISDOM TOOTH EXTRACTION      OB History    Gravida Para Term Preterm AB Living   1 1   1   2    SAB TAB Ectopic Multiple Live Births         1 2       Home Medications    Prior to Admission medications   Medication Sig Start Date End Date Taking? Authorizing Provider  minocycline (DYNACIN) 100 MG tablet Take 100 mg by mouth 2 (two) times daily.    [provider]    Family History Family History  Problem Relation Age of Onset  . Colon cancer Neg Hx   . Colon polyps Neg Hx   . Kidney disease Neg Hx   . Diabetes Neg Hx   . Esophageal cancer Neg Hx   .  Gallbladder disease Neg Hx   . Heart disease Neg Hx   . Asthma Neg Hx   . Cancer Neg Hx   . Stroke Neg Hx     Social History Social History  Substance Use Topics  . Smoking status: Never Smoker  . Smokeless tobacco: Never Used  . Alcohol use No     Comment: occasional when not pregnant     Allergies   Zyrtec [cetirizine]   Review of Systems Review of Systems  Constitutional: Negative for chills and fever.  HENT: Positive for congestion, ear pain, sneezing and sore throat. Negative for rhinorrhea, sinus pressure and trouble swallowing.   Eyes: Negative for pain.  Respiratory: Negative for shortness of breath and wheezing.   Cardiovascular: Negative for chest pain.  Gastrointestinal: Positive for diarrhea and nausea. Negative for abdominal pain and vomiting.  Genitourinary: Negative for dysuria.  Musculoskeletal: Negative for arthralgias and myalgias.  Neurological: Negative for headaches.     Physical Exam Triage Vital Signs ED Triage Vitals [12/16/16 1113]  Enc Vitals Group     BP (!) 126/94     Pulse Rate 81     Resp 18  Temp 98.3 F (36.8 C)     Temp src      SpO2 100 %     Weight      Height      Head Circumference      Peak Flow      Pain Score      Pain Loc      Pain Edu?      Excl. in [place]?    No data found.   Updated Vital Signs BP (!) 126/94   Pulse 81   Temp 98.3 F (36.8 C)   Resp 18   LMP 11/23/2016   SpO2 100%   Visual Acuity Right Eye Distance:   Left Eye Distance:   Bilateral Distance:    Right Eye Near:   Left Eye Near:    Bilateral Near:     Physical Exam  Constitutional: She is oriented to person, place, and time. She appears well-developed and well-nourished. No distress.  HENT:  Head: Normocephalic and atraumatic.  Right Ear: Tympanic membrane and external ear normal.  Left Ear: External ear normal. Tympanic membrane is erythematous and bulging. Tympanic membrane is not perforated.  No middle ear effusion. No  decreased hearing is noted.  Nose: Nose normal.  Mouth/Throat: Oropharynx is clear and moist. No oropharyngeal exudate.  Eyes: Pupils are equal, round, and reactive to light. Conjunctivae and EOM are normal.  Neck: Normal range of motion. Neck supple.  Cardiovascular: Normal rate, regular rhythm and normal heart sounds.   No murmur heard. Pulmonary/Chest: Effort normal and breath sounds normal. No respiratory distress. She has no wheezes.  Abdominal: Soft. Bowel sounds are normal. She exhibits no distension. There is no tenderness. There is no rebound and no guarding.  Musculoskeletal: Normal range of motion.  Lymphadenopathy:    She has no cervical adenopathy.  Neurological: She is alert and oriented to person, place, and time.  Skin: Skin is warm and dry. Capillary refill takes less than 2 seconds. No rash noted.  Psychiatric: She has a normal mood and affect.     UC Treatments / Results  Labs (all labs ordered are listed, but only abnormal results are displayed) Labs Reviewed  CULTURE, GROUP A STREP [facility])  POCT RAPID STREP A    EKG  EKG Interpretation None       Radiology No results found.  Procedures Procedures (including critical care time)  Medications Ordered in UC Medications - No data to display   Initial Impression / Assessment and Plan / UC Course  I have reviewed the triage vital signs and the nursing notes.  Pertinent labs & imaging results that were available during my care of the patient were reviewed by me and considered in my medical decision making (see chart for details).   Patient is a 31 yo F who presented to urgent care with sore throat and congestion. Rapid strep negative. On exam she is afebrile and well appearing but L tympanic membrane was erythematous and bulging consistent with an acute otitis media. Will treat with amoxicillin for 10 days. Advised to stay well hydrated at home and OTC cough drops or honey at home for symptomatic relief.  Patient given return precautions including but not limited to worsening, rigors, unable to tolerate po. Patient voiced good understanding.   Final Clinical Impressions(s) / UC Diagnoses   Final diagnoses:  Other acute nonsuppurative otitis media of left ear, recurrence not specified    New Prescriptions New Prescriptions   No medications on file  Bufford Lope, DO 12/16/16 1206

## 2016-12-16 NOTE — Discharge Instructions (Signed)
Take antibiotic amoxicillin twice a day for 10 days. Keep well hydrated at home. You can take over the counter cough drops or spoonful of honey for the throat irritation.

## 2016-12-16 NOTE — ED Triage Notes (Signed)
Pt here for 3 days of nasal congestion and sneezing.

## 2016-12-19 LAB — CULTURE, GROUP A STREP (THRC)

## 2016-12-26 ENCOUNTER — Emergency Department (HOSPITAL_COMMUNITY)
Admission: EM | Admit: 2016-12-26 | Discharge: 2016-12-26 | Disposition: A | Discharge status: Home / Self Care | Payer: PRIVATE HEALTH INSURANCE | Attending: Emergency Medicine | Admitting: Emergency Medicine

## 2016-12-26 ENCOUNTER — Emergency Department (HOSPITAL_COMMUNITY): Payer: PRIVATE HEALTH INSURANCE

## 2016-12-26 ENCOUNTER — Encounter (HOSPITAL_COMMUNITY): Payer: Self-pay | Admitting: *Deleted

## 2016-12-26 DIAGNOSIS — R0789 Other chest pain: Secondary | ICD-10-CM | POA: Insufficient documentation

## 2016-12-26 DIAGNOSIS — R1010 Upper abdominal pain, unspecified: Secondary | ICD-10-CM | POA: Insufficient documentation

## 2016-12-26 DIAGNOSIS — K219 Gastro-esophageal reflux disease without esophagitis: Secondary | ICD-10-CM | POA: Diagnosis not present

## 2016-12-26 DIAGNOSIS — R112 Nausea with vomiting, unspecified: Secondary | ICD-10-CM | POA: Diagnosis present

## 2016-12-26 DIAGNOSIS — K529 Noninfective gastroenteritis and colitis, unspecified: Secondary | ICD-10-CM

## 2016-12-26 DIAGNOSIS — R300 Dysuria: Secondary | ICD-10-CM | POA: Insufficient documentation

## 2016-12-26 DIAGNOSIS — R197 Diarrhea, unspecified: Secondary | ICD-10-CM | POA: Diagnosis not present

## 2016-12-26 DIAGNOSIS — I10 Essential (primary) hypertension: Secondary | ICD-10-CM | POA: Diagnosis not present

## 2016-12-26 DIAGNOSIS — R350 Frequency of micturition: Secondary | ICD-10-CM | POA: Insufficient documentation

## 2016-12-26 LAB — URINALYSIS, ROUTINE W REFLEX MICROSCOPIC
Bilirubin Urine: NEGATIVE
Glucose, UA: NEGATIVE mg/dL
Hgb urine dipstick: NEGATIVE
Ketones, ur: NEGATIVE mg/dL
Leukocytes, UA: NEGATIVE
Nitrite: NEGATIVE
Protein, ur: NEGATIVE mg/dL
Specific Gravity, Urine: 1.008 (ref 1.005–1.030)
pH: 5 (ref 5.0–8.0)

## 2016-12-26 LAB — COMPREHENSIVE METABOLIC PANEL
ALT: 14 U/L (ref 14–54)
AST: 23 U/L (ref 15–41)
Albumin: 4.1 g/dL (ref 3.5–5.0)
Alkaline Phosphatase: 72 U/L (ref 38–126)
Anion gap: 7 (ref 5–15)
BUN: 8 mg/dL (ref 6–20)
CO2: 22 mmol/L (ref 22–32)
Calcium: 9.5 mg/dL (ref 8.9–10.3)
Chloride: 106 mmol/L (ref 101–111)
Creatinine, Ser: 0.88 mg/dL (ref 0.44–1.00)
GFR calc Af Amer: >60 mL/min (ref >60)
GFR calc non Af Amer: >60 mL/min (ref >60)
Glucose, Bld: 89 mg/dL (ref 65–99)
Potassium: 4.2 mmol/L (ref 3.5–5.1)
Sodium: 135 mmol/L (ref 135–145)
Total Bilirubin: 0.5 mg/dL (ref 0.3–1.2)
Total Protein: 7.1 g/dL (ref 6.5–8.1)

## 2016-12-26 LAB — CBC
HCT: 37.5 % (ref 36.0–46.0)
Hemoglobin: 12.4 g/dL (ref 12.0–15.0)
MCH: 29.3 pg (ref 26.0–34.0)
MCHC: 33.1 g/dL (ref 30.0–36.0)
MCV: 88.7 fL (ref 78.0–100.0)
Platelets: 243 10*3/uL (ref 150–400)
RBC: 4.23 MIL/uL (ref 3.87–5.11)
RDW: 15.8 % — ABNORMAL HIGH (ref 11.5–15.5)
WBC: 5.3 10*3/uL (ref 4.0–10.5)

## 2016-12-26 LAB — LIPASE, BLOOD: Lipase: 28 U/L (ref 11–51)

## 2016-12-26 LAB — PREGNANCY, URINE: Preg Test, Ur: NEGATIVE

## 2016-12-26 MED ORDER — MORPHINE SULFATE (PF) 4 MG/ML IV SOLN
4.0000 mg | Freq: Once | INTRAVENOUS | Status: AC
  Administered 2016-12-26: 4 mg via INTRAVENOUS
  Filled 2016-12-26: qty 1

## 2016-12-26 MED ORDER — FAMOTIDINE IN NACL 20-0.9 MG/50ML-% IV SOLN
20.0000 mg | Freq: Once | INTRAVENOUS | Status: AC
  Administered 2016-12-26: 20 mg via INTRAVENOUS
  Filled 2016-12-26: qty 50

## 2016-12-26 MED ORDER — RANITIDINE HCL 150 MG PO TABS: 150.0000 mg | ORAL_TABLET | Freq: Two times a day (BID) | ORAL | 0 refills | Status: DC

## 2016-12-26 MED ORDER — SODIUM CHLORIDE 0.9 % IV BOLUS (SEPSIS)
1000.0000 mL | Freq: Once | INTRAVENOUS | Status: AC
  Administered 2016-12-26: 1000 mL via INTRAVENOUS

## 2016-12-26 MED ORDER — ONDANSETRON 4 MG PO TBDP: 4.0000 mg | ORAL_TABLET | Freq: Three times a day (TID) | ORAL | 0 refills | Status: DC | PRN

## 2016-12-26 MED ORDER — GI COCKTAIL ~~LOC~~
30.0000 mL | Freq: Once | ORAL | Status: AC
  Administered 2016-12-26: 30 mL via ORAL
  Filled 2016-12-26: qty 30

## 2016-12-26 MED ORDER — ONDANSETRON HCL 4 MG/2ML IJ SOLN
4.0000 mg | Freq: Once | INTRAMUSCULAR | Status: AC
  Administered 2016-12-26: 4 mg via INTRAVENOUS
  Filled 2016-12-26: qty 2

## 2016-12-26 NOTE — Discharge Instructions (Signed)
Your symptoms are likely from gastritis, viral gastroenteritis, an ulcer, or just from your recent antibiotic use. You will need to take zantac as directed, and avoid spicy/fatty/acidic foods, avoid soda/coffee/tea/alcohol. Avoid laying down flat within 30 minutes of eating. Avoid NSAIDs like ibuprofen/aleve/motrin/etc on an empty stomach. May consider using over the counter tums/maalox as needed for additional relief. May consider using over the counter pepto bismol or other over the counter remedies to help with symptoms. Use zofran as directed as needed for nausea. Use tylenol as needed for pain. Stay well hydrated with small sips of fluids throughout the day. Follow a BRAT (banana-rice-applesauce-toast) diet as described below for the next 24-48 hours. The 'BRAT' diet is suggested, then progress to diet as tolerated as symptoms abate. Call your regular doctor if bloody stools, persistent diarrhea, vomiting, fever or abdominal pain. Follow up with the  and wellness center in 5-7 days for recheck of symptoms and to establish medical care. Return to the ER for changes or worsening symptoms.

## 2016-12-26 NOTE — ED Provider Notes (Signed)
[facility] EMERGENCY DEPARTMENT Provider Note   CSN: [id number] Arrival date & time: 12/26/16  1318     History   Chief Complaint Chief Complaint  Patient presents with  . Emesis    HPI Sherri [name] is a 31 y.o. female with a PMHx of fibromyalgia, HTN, uterine fibroids, and other conditions listed below, who presents to the ED with complaints of one day of crampy upper abdominal pain, nausea, vomiting, diarrhea, dysuria, malodorous urine, and increased urinary frequency and urgency. Patient describes her pain is 7/10 intermittent crampy upper abdominal pain that radiates into the center of her chest, worse with laying on her belly, and with no treatments tried prior to arrival. She has had one episode of nonbloody nonbilious emesis and 2 episodes of nonbloody watery diarrhea today. Chart review reveals she was seen at [facility] on 12/16/16 for sore throat complaint, had RST which was negative and strep culture was negative as well, however she was discharged with amoxicillin for an ear infection; pt states she completed this medication without any difficulty. LMP was 1 week ago. 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   She denies fevers, chills, cough, SOB, LE swelling, recent travel/surgery/immobilization, estrogen use, personal/family hx of DVT/PE, constipation, obstipation, melena, hematochezia, hematemesis, hematuria, dysuria, vaginal bleeding/discharge, myalgias, arthralgias, numbness, tingling, focal weakness, or any other complaints at this time. Denies sick contacts, definite suspicious food intake, EtOH use, or NSAID use.   The history is provided by the patient and medical records. No language interpreter was used.  Abdominal Pain   This is a new problem. The current episode started yesterday. The problem occurs daily. The problem has not changed since onset.The pain is associated with an  unknown factor. The pain is located in the epigastric region and chest. The quality of the pain is cramping. The pain is at a severity of 7/10. The pain is moderate. Associated symptoms include diarrhea, nausea, vomiting, dysuria and frequency. Pertinent negatives include fever, flatus, hematochezia, melena, constipation, hematuria, arthralgias and myalgias. The symptoms are aggravated by certain positions. Nothing relieves the symptoms.    Past Medical History:  Diagnosis Date  . Fibromyalgia    denies today  . Hypertension   . 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 Urinary tract infection   . Uterine fibroid     Patient Active Problem List   Diagnosis Date Noted  . Yeast infection 12/03/2016  . Status post cesarean delivery 06/06/2014  . Uterine fibroid 12/25/2013  . Obesity 12/25/2013    Past Surgical History:  Procedure Laterality Date  . CESAREAN SECTION N/A 06/05/2014   Procedure: CESAREAN SECTION;  Surgeon: Truett Mainland, DO;  Location: [facility];  Service: Obstetrics;  Laterality: N/A;  . WISDOM TOOTH EXTRACTION      OB History    Gravida Para Term Preterm AB Living   1 1   1   2    SAB TAB Ectopic Multiple Live Births         1 2       Home Medications    Prior to Admission medications   Medication Sig Start Date End Date Taking? Authorizing Provider  amoxicillin (AMOXIL) 500 MG capsule Take 1 capsule (500 mg total) by mouth 2 (two) times daily. 12/16/16 12/26/16  Vanessa Kick, MD  minocycline (DYNACIN) 100 MG tablet Take 100 mg by mouth 2 (two) times daily.  [provider]    Family History Family History  Problem Relation Age of Onset  . Colon cancer Neg Hx   . Colon polyps Neg Hx   . Kidney disease Neg Hx   . Diabetes Neg Hx   . Esophageal cancer Neg Hx   . Gallbladder disease Neg Hx   . Heart disease Neg Hx   . Asthma Neg Hx   . Cancer Neg Hx   . Stroke Neg Hx     Social History Social History  Substance Use  Topics  . Smoking status: Never Smoker  . Smokeless tobacco: Never Used  . Alcohol use No     Comment: occasional when not pregnant     Allergies   Zyrtec [cetirizine]   Review of Systems Review of Systems  Constitutional: Negative for chills and fever.  Respiratory: Negative for cough and shortness of breath.   Cardiovascular: Positive for chest pain (from stomach). Negative for leg swelling.  Gastrointestinal: Positive for abdominal pain, diarrhea, nausea and vomiting. Negative for blood in stool, constipation, flatus, hematochezia and melena.  Genitourinary: Positive for dysuria, frequency and urgency. Negative for hematuria, vaginal bleeding and vaginal discharge.       +malodorous urine  Musculoskeletal: Negative for arthralgias and myalgias.  Skin: Negative for color change.  Allergic/Immunologic: Negative for immunocompromised state.  Neurological: Negative for weakness and numbness.  Psychiatric/Behavioral: Negative for confusion.   All other systems reviewed and are negative for acute change except as noted in the HPI.    Physical Exam Updated Vital Signs BP (!) 135/57 (BP Location: Right Arm)   Pulse 78   Temp 98.6 F (37 C) (Oral)   Resp 17   Ht 5\' 3"  (1.6 m)   Wt 77.1 kg (170 lb)   LMP 12/19/2016 (Approximate)   SpO2 98%   BMI 30.11 kg/m   Physical Exam  Constitutional: She is oriented to person, place, and time. Vital signs are normal. She appears well-developed and well-nourished.  Non-toxic appearance. No distress.  Afebrile, nontoxic, NAD  HENT:  Head: Normocephalic and atraumatic.  Mouth/Throat: Oropharynx is clear and moist and mucous membranes are normal.  Eyes: Conjunctivae and EOM are normal. Right eye exhibits no discharge. Left eye exhibits no discharge.  Neck: Normal range of motion. Neck supple.  Cardiovascular: Normal rate, regular rhythm, normal heart sounds and intact distal pulses.  Exam reveals no gallop and no friction rub.   No murmur  heard. Pulmonary/Chest: Effort normal and breath sounds normal. No respiratory distress. She has no decreased breath sounds. She has no wheezes. She has no rhonchi. She has no rales.  Abdominal: Soft. Normal appearance and bowel sounds are normal. She exhibits no distension. There is generalized tenderness and tenderness in the right upper quadrant and epigastric area. There is no rigidity, no rebound, no guarding, no CVA tenderness, no tenderness at McBurney's point and negative Murphy's sign.  Soft, nondistended, +BS throughout, with mild diffuse abd TTP most focally in RUQ/epigastric area, with no r/g/r, +murphy's, neg mcburney's, no CVA TTP   Musculoskeletal: Normal range of motion.  Neurological: She is alert and oriented to person, place, and time. She has normal strength. No sensory deficit.  Skin: Skin is warm, dry and intact. No rash noted.  Psychiatric: She has a normal mood and affect.  Nursing note and vitals reviewed.    ED Treatments / Results  Labs (all labs ordered are listed, but only abnormal results are displayed) Labs Reviewed  CBC - Abnormal;  Notable for the following:       Result Value   RDW 15.8 (*)    All other components within normal limits  URINALYSIS, ROUTINE W REFLEX MICROSCOPIC - Abnormal; Notable for the following:    Color, Urine STRAW (*)    All other components within normal limits  LIPASE, BLOOD  COMPREHENSIVE METABOLIC PANEL  PREGNANCY, URINE    EKG  EKG Interpretation  Date/Time:  Saturday December 26 2016 17:57:20 EDT Ventricular Rate:  61 PR Interval:    QRS Duration: 95 QT Interval:  410 QTC Calculation: 413 R Axis:   31 Text Interpretation:  Normal sinus rhythm Normal ECG Confirmed by Pattricia Boss [phone]) on 12/26/2016 6:16:41 PM       Radiology US Abdomen Complete  Result Date: 12/26/2016 CLINICAL DATA:  Epigastric pain beginning yesterday. Right upper quadrant tenderness. Urinary tract infections. EXAM: ABDOMEN ULTRASOUND  COMPLETE COMPARISON:  None. FINDINGS: Gallbladder: No gallstones or wall thickening visualized. No sonographic Murphy sign noted by sonographer. Common bile duct: Diameter: 2 mm, within normal limits. Liver: No focal lesion identified. Within normal limits in parenchymal echogenicity. Portal vein is patent on color Doppler imaging with normal direction of blood flow towards the liver. IVC: No abnormality visualized. Pancreas: Visualized portion unremarkable. Spleen: Size and appearance within normal limits. Right Kidney: Length: 10.8 cm. Echogenicity within normal limits. No mass or hydronephrosis visualized. Left Kidney: Length: 10.4 cm. Echogenicity within normal limits. No mass or hydronephrosis visualized. Abdominal aorta: No aneurysm visualized. Other findings: None. IMPRESSION: Negative abdomen ultrasound. Electronically Signed   By: Earle Gell M.D.   On: 12/26/2016 18:44    Procedures Procedures (including critical care time)  Medications Ordered in ED Medications  sodium chloride 0.9 % bolus 1,000 mL (1,000 mLs Intravenous New Bag/Given 12/26/16 1914)  ondansetron (ZOFRAN) injection 4 mg (4 mg Intravenous Given 12/26/16 1915)  morphine 4 MG/ML injection 4 mg (4 mg Intravenous Given 12/26/16 1915)  gi cocktail (Maalox,Lidocaine,Donnatal) (30 mLs Oral Given 12/26/16 1915)  famotidine (PEPCID) IVPB 20 mg premix (20 mg Intravenous New Bag/Given 12/26/16 1915)     Initial Impression / Assessment and Plan / ED Course  I have reviewed the triage vital signs and the nursing notes.  Pertinent labs & imaging results that were available during my care of the patient were reviewed by me and considered in my medical decision making (see chart for details).     31 y.o. female here with upper abd pain radiating into chest, n/v/d and dysuria/urine frequency/urgency/malodorous urine x1 day. On exam, diffuse abd TTP most focally in epigastric/RUQ area, +murphy's, nonperitoneal. Clear lung exam. Labs  thus far reveal: CBC WNL, CMP WNL, lipase WNL, U/A and Upreg pending. Will get EKG and abd U/S to further eval for biliary etiology, although symptoms sound more likely either gastroenteritis vs urinary vs related to recent abx. Will give fluids, zofran, morphine, GI cocktail, and pepcid. Will reassess shortly   7:52 PM U/A unremarkable. Upreg neg. Abd U/S completely unremarkable. EKG completely unremarkable and without acute ischemic findings. Pt feeling better and tolerating PO well. Symptoms consistent with viral gastroenteritis/gastritis/PUD vs antibiotic related GI discomfort; can't explain the urinary symptoms entirely, given unremarkable U/A, but doubt need for further investigation of this at this time. Discussed diet/lifestyle modifications for symptoms, BRAT diet advised, will start on zantac/zofran, advised tylenol and avoidance/sparing use of NSAIDs only on full stomach, discussed other OTC remedies for symptomatic relief, and f/up with Seltzer in 5-7 days for recheck of symptoms  and ongoing evaluation/management. I explained the diagnosis and have given explicit precautions to return to the ER including for any other new or worsening symptoms. The patient understands and accepts the medical plan as it's been dictated and I have answered their questions. Discharge instructions concerning home care and prescriptions have been given. The patient is STABLE and is discharged to home in good condition.     Final Clinical Impressions(s) / ED Diagnoses   Final diagnoses:  Nausea vomiting and diarrhea  Gastroenteritis  Upper abdominal pain  Atypical chest pain  Gastroesophageal reflux disease, esophagitis presence not specified    New Prescriptions New Prescriptions   ONDANSETRON (ZOFRAN ODT) 4 MG DISINTEGRATING TABLET    Take 1 tablet (4 mg total) by mouth every 8 (eight) hours as needed for nausea or vomiting.   RANITIDINE (ZANTAC) 150 MG TABLET    Take 1 tablet (150 mg total) by mouth 2 (two)  times daily.     [address] Anina Schnake, [place], [place] 12/26/16 1952    Pattricia Boss, MD 12/26/16 [phone]

## 2016-12-26 NOTE — ED Triage Notes (Signed)
Pt c/o generalized abd cramping onset today with x 1 emesis episodes, pt c/o diarrhea, pt reports x 2 liquid stools today, pt c/o sore throat, A&O x4

## 2016-12-27 ENCOUNTER — Ambulatory Visit (HOSPITAL_COMMUNITY)
Admission: EM | Admit: 2016-12-27 | Discharge: 2016-12-27 | Disposition: A | Discharge status: Home / Self Care | Payer: PRIVATE HEALTH INSURANCE | Attending: Internal Medicine | Admitting: Internal Medicine

## 2016-12-27 ENCOUNTER — Encounter (HOSPITAL_COMMUNITY): Payer: Self-pay | Admitting: Emergency Medicine

## 2016-12-27 DIAGNOSIS — R1030 Lower abdominal pain, unspecified: Secondary | ICD-10-CM | POA: Insufficient documentation

## 2016-12-27 DIAGNOSIS — I10 Essential (primary) hypertension: Secondary | ICD-10-CM | POA: Diagnosis not present

## 2016-12-27 DIAGNOSIS — R102 Pelvic and perineal pain: Secondary | ICD-10-CM

## 2016-12-27 DIAGNOSIS — Z202 Contact with and (suspected) exposure to infections with a predominantly sexual mode of transmission: Secondary | ICD-10-CM

## 2016-12-27 DIAGNOSIS — M797 Fibromyalgia: Secondary | ICD-10-CM | POA: Diagnosis not present

## 2016-12-27 DIAGNOSIS — Z711 Person with feared health complaint in whom no diagnosis is made: Secondary | ICD-10-CM

## 2016-12-27 DIAGNOSIS — R109 Unspecified abdominal pain: Secondary | ICD-10-CM | POA: Diagnosis not present

## 2016-12-27 DIAGNOSIS — B379 Candidiasis, unspecified: Secondary | ICD-10-CM | POA: Diagnosis not present

## 2016-12-27 DIAGNOSIS — N390 Urinary tract infection, site not specified: Secondary | ICD-10-CM | POA: Diagnosis not present

## 2016-12-27 DIAGNOSIS — N898 Other specified noninflammatory disorders of vagina: Secondary | ICD-10-CM | POA: Diagnosis present

## 2016-12-27 DIAGNOSIS — D259 Leiomyoma of uterus, unspecified: Secondary | ICD-10-CM | POA: Diagnosis not present

## 2016-12-27 DIAGNOSIS — R1013 Epigastric pain: Secondary | ICD-10-CM | POA: Insufficient documentation

## 2016-12-27 DIAGNOSIS — N949 Unspecified condition associated with female genital organs and menstrual cycle: Secondary | ICD-10-CM

## 2016-12-27 MED ORDER — LIDOCAINE HCL (PF) 1 % IJ SOLN
INTRAMUSCULAR | Status: AC
  Filled 2016-12-27: qty 2

## 2016-12-27 MED ORDER — CEFTRIAXONE SODIUM 1 G IJ SOLR
1.0000 g | Freq: Once | INTRAMUSCULAR | Status: AC
  Administered 2016-12-27: 1 g via INTRAMUSCULAR

## 2016-12-27 MED ORDER — DOXYCYCLINE HYCLATE 100 MG PO CAPS: 100.0000 mg | ORAL_CAPSULE | Freq: Two times a day (BID) | ORAL | 0 refills | Status: DC

## 2016-12-27 MED ORDER — CEFTRIAXONE SODIUM 1 G IJ SOLR
INTRAMUSCULAR | Status: AC
  Filled 2016-12-27: qty 10

## 2016-12-27 MED ORDER — CEFTRIAXONE SODIUM 250 MG IJ SOLR
INTRAMUSCULAR | Status: AC
  Filled 2016-12-27: qty 250

## 2016-12-27 NOTE — ED Provider Notes (Signed)
[facility]    CSN: [id number] Arrival date & time: 12/27/16  1605     History   Chief Complaint Chief Complaint  Patient presents with  . Exposure to STD    HPI Sherri [name] is a 31 y.o. female.   HPI  Sherri [name] is a 31 y.o. female presenting to UC with c/o lower abdominal cramping, vaginal discharge and burning sensation.  She does not use condoms and is not on birth control. LMP: 12/19/16.  She notes her partner is being treated for gonorrhea.  Denies fever, chills, n/v/d. Denies pain with urination.   Past Medical History:  Diagnosis Date  . Fibromyalgia    denies today  . Hypertension   . 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 Urinary tract infection   . Uterine fibroid     Patient Active Problem List   Diagnosis Date Noted  . Yeast infection 12/03/2016  . Status post cesarean delivery 06/06/2014  . Uterine fibroid 12/25/2013  . Obesity 12/25/2013    Past Surgical History:  Procedure Laterality Date  . CESAREAN SECTION N/A 06/05/2014   Procedure: CESAREAN SECTION;  Surgeon: Truett Mainland, DO;  Location: [facility];  Service: Obstetrics;  Laterality: N/A;  . WISDOM TOOTH EXTRACTION      OB History    Gravida Para Term Preterm AB Living   1 1   1   2    SAB TAB Ectopic Multiple Live Births         1 2       Home Medications    Prior to Admission medications   Medication Sig Start Date End Date Taking? Authorizing Provider  doxycycline (VIBRAMYCIN) 100 MG capsule Take 1 capsule (100 mg total) by mouth 2 (two) times daily. One po bid x 14 days 12/27/16   Noe Gens, PA-C  minocycline (DYNACIN) 100 MG tablet Take 100 mg by mouth 2 (two) times daily.    [provider]  ondansetron (ZOFRAN ODT) 4 MG disintegrating tablet Take 1 tablet (4 mg total) by mouth every 8 (eight) hours as needed for nausea or vomiting. 12/26/16   Street, [place], PA-C  ranitidine (ZANTAC) 150 MG tablet Take 1 tablet (150 mg  total) by mouth 2 (two) times daily. 12/26/16   Street, [place], PA-C    Family History Family History  Problem Relation Age of Onset  . Colon cancer Neg Hx   . Colon polyps Neg Hx   . Kidney disease Neg Hx   . Diabetes Neg Hx   . Esophageal cancer Neg Hx   . Gallbladder disease Neg Hx   . Heart disease Neg Hx   . Asthma Neg Hx   . Cancer Neg Hx   . Stroke Neg Hx     Social History Social History  Substance Use Topics  . Smoking status: Never Smoker  . Smokeless tobacco: Never Used  . Alcohol use No     Comment: occasional when not pregnant     Allergies   Zyrtec [cetirizine]   Review of Systems Review of Systems  Constitutional: Negative for chills and fever.  Gastrointestinal: Positive for abdominal pain ( lower). Negative for diarrhea, nausea and vomiting.  Genitourinary: Positive for pelvic pain, vaginal discharge and vaginal pain. Negative for decreased urine volume, dysuria, flank pain, frequency, genital sores and vaginal bleeding.  Musculoskeletal: Negative for back pain and myalgias.     Physical Exam Triage Vital Signs ED Triage Vitals  Enc Vitals Group     BP 12/27/16 1617 134/64     Pulse Rate 12/27/16 1617 72     Resp 12/27/16 1617 16     Temp 12/27/16 1617 98.6 F (37 C)     Temp Source 12/27/16 1617 Oral     SpO2 12/27/16 1617 100 %     Weight --      Height --      Head Circumference --      Peak Flow --      Pain Score 12/27/16 1618 10     Pain Loc --      Pain Edu? --      Excl. in [place]? --    No data found.   Updated Vital Signs BP 134/64 (BP Location: Left Arm)   Pulse 72   Temp 98.6 F (37 C) (Oral)   Resp 16   LMP 12/19/2016 (Approximate)   SpO2 100%   Visual Acuity Right Eye Distance:   Left Eye Distance:   Bilateral Distance:    Right Eye Near:   Left Eye Near:    Bilateral Near:     Physical Exam  Constitutional: She is oriented to person, place, and time. She appears well-developed and well-nourished. No  distress.  HENT:  Head: Normocephalic and atraumatic.  Mouth/Throat: Oropharynx is clear and moist.  Eyes: EOM are normal.  Neck: Normal range of motion.  Cardiovascular: Normal rate and regular rhythm.   Pulmonary/Chest: Effort normal and breath sounds normal. No respiratory distress. She has no wheezes. She has no rales.  Abdominal: Soft. She exhibits no distension and no mass. There is tenderness ( across lower abdomen, no point tenderness). There is no rebound, no guarding and no CVA tenderness.  Genitourinary:  Genitourinary Comments: Chaperoned exam. Normal external genitalia. Vaginal canal scant thin clear discharge. No bleeding. CMT present. No adnexal tenderness or masses   Musculoskeletal: Normal range of motion.  Neurological: She is alert and oriented to person, place, and time.  Skin: Skin is warm and dry. She is not diaphoretic.  Psychiatric: She has a normal mood and affect. Her behavior is normal.  Nursing note and vitals reviewed.    UC Treatments / Results  Labs (all labs ordered are listed, but only abnormal results are displayed) Labs Reviewed  HIV ANTIBODY (ROUTINE TESTING)  RPR  CERVICOVAGINAL ANCILLARY ONLY    EKG  EKG Interpretation None       Radiology US Abdomen Complete  Result Date: 12/26/2016 CLINICAL DATA:  Epigastric pain beginning yesterday. Right upper quadrant tenderness. Urinary tract infections. EXAM: ABDOMEN ULTRASOUND COMPLETE COMPARISON:  None. FINDINGS: Gallbladder: No gallstones or wall thickening visualized. No sonographic Murphy sign noted by sonographer. Common bile duct: Diameter: 2 mm, within normal limits. Liver: No focal lesion identified. Within normal limits in parenchymal echogenicity. Portal vein is patent on color Doppler imaging with normal direction of blood flow towards the liver. IVC: No abnormality visualized. Pancreas: Visualized portion unremarkable. Spleen: Size and appearance within normal limits. Right Kidney:  Length: 10.8 cm. Echogenicity within normal limits. No mass or hydronephrosis visualized. Left Kidney: Length: 10.4 cm. Echogenicity within normal limits. No mass or hydronephrosis visualized. Abdominal aorta: No aneurysm visualized. Other findings: None. IMPRESSION: Negative abdomen ultrasound. Electronically Signed   By: Earle Gell M.D.   On: 12/26/2016 18:44    Procedures Procedures (including critical care time)  Medications Ordered in UC Medications  cefTRIAXone (ROCEPHIN) injection 1 g (1 g Intramuscular Given 12/27/16 1752)  Initial Impression / Assessment and Plan / UC Course  I have reviewed the triage vital signs and the nursing notes.  Pertinent labs & imaging results that were available during my care of the patient were reviewed by me and considered in my medical decision making (see chart for details).     Swab and blood sent to lab for STI testing  Due to CMT and likely exposure to gonorrhea, will start empiric treatment. Rocephin 1g IM given  Will start on doxycycline Discussed refraining from sex until labs result. If positive, have all partners tested and treated, wait at least  7 days before sex. Use condoms. Discussed risk of unexpected pregnancy if not using birth control or condoms. "pulling out" is not always effective.   Final Clinical Impressions(s) / UC Diagnoses   Final diagnoses:  Concern about STD in female without diagnosis  Cervical motion tenderness    New Prescriptions New Prescriptions   DOXYCYCLINE (VIBRAMYCIN) 100 MG CAPSULE    Take 1 capsule (100 mg total) by mouth 2 (two) times daily. One po bid x 14 days     Controlled Substance Prescriptions [place] Controlled Substance Registry consulted? Not Applicable   Tyrell Antonio 12/27/16 4098

## 2016-12-27 NOTE — ED Triage Notes (Signed)
Pt here for STD check... Reports partner is being treated for Gonorrhea  Sx today include vag d/c and burning sensation of vagina  Does not use condoms  A&O x4... NAD... Ambulatory

## 2016-12-28 LAB — CERVICOVAGINAL ANCILLARY ONLY
Bacterial vaginitis: NEGATIVE
Candida vaginitis: POSITIVE — AB
Chlamydia: NEGATIVE
Neisseria Gonorrhea: NEGATIVE
Trichomonas: NEGATIVE

## 2016-12-28 LAB — RPR: RPR Ser Ql: NONREACTIVE

## 2016-12-28 LAB — HIV ANTIBODY (ROUTINE TESTING W REFLEX): HIV Screen 4th Generation wRfx: NONREACTIVE

## 2016-12-29 ENCOUNTER — Telehealth (HOSPITAL_COMMUNITY): Payer: Self-pay | Admitting: Internal Medicine

## 2016-12-29 MED ORDER — FLUCONAZOLE 200 MG PO TABS: 200.0000 mg | ORAL_TABLET | Freq: Once | ORAL | 0 refills | Status: AC

## 2016-12-29 NOTE — Telephone Encounter (Signed)
Clinical staff, please let patient/parent know that test for candida (yeast) was positive.  Rx fluconazole was sent to the pharmacy of record.  Recheck for further evaluation if symptoms are not improving.  LM

## 2017-01-02 ENCOUNTER — Emergency Department (HOSPITAL_COMMUNITY)
Admission: EM | Admit: 2017-01-02 | Discharge: 2017-01-02 | Disposition: A | Discharge status: Home / Self Care | Payer: PRIVATE HEALTH INSURANCE | Attending: Emergency Medicine | Admitting: Emergency Medicine

## 2017-01-02 ENCOUNTER — Emergency Department (HOSPITAL_COMMUNITY): Payer: PRIVATE HEALTH INSURANCE

## 2017-01-02 ENCOUNTER — Encounter (HOSPITAL_COMMUNITY): Payer: Self-pay | Admitting: Emergency Medicine

## 2017-01-02 DIAGNOSIS — R102 Pelvic and perineal pain: Secondary | ICD-10-CM | POA: Diagnosis not present

## 2017-01-02 DIAGNOSIS — N898 Other specified noninflammatory disorders of vagina: Secondary | ICD-10-CM | POA: Diagnosis present

## 2017-01-02 DIAGNOSIS — I10 Essential (primary) hypertension: Secondary | ICD-10-CM | POA: Insufficient documentation

## 2017-01-02 DIAGNOSIS — R300 Dysuria: Secondary | ICD-10-CM | POA: Insufficient documentation

## 2017-01-02 LAB — URINALYSIS, ROUTINE W REFLEX MICROSCOPIC
Bilirubin Urine: NEGATIVE
Glucose, UA: NEGATIVE mg/dL
Hgb urine dipstick: NEGATIVE
Ketones, ur: NEGATIVE mg/dL
Leukocytes, UA: NEGATIVE
Nitrite: NEGATIVE
Protein, ur: NEGATIVE mg/dL
Specific Gravity, Urine: 1.018 (ref 1.005–1.030)
pH: 5 (ref 5.0–8.0)

## 2017-01-02 LAB — WET PREP, GENITAL
Clue Cells Wet Prep HPF POC: NONE SEEN
Trich, Wet Prep: NONE SEEN
Yeast Wet Prep HPF POC: NONE SEEN

## 2017-01-02 LAB — CBC WITH DIFFERENTIAL/PLATELET
Basophils Absolute: 0 10*3/uL (ref 0.0–0.1)
Basophils Relative: 0 %
Eosinophils Absolute: 0.1 10*3/uL (ref 0.0–0.7)
Eosinophils Relative: 2 %
HCT: 36.1 % (ref 36.0–46.0)
Hemoglobin: 12 g/dL (ref 12.0–15.0)
Lymphocytes Relative: 39 %
Lymphs Abs: 2.1 10*3/uL (ref 0.7–4.0)
MCH: 29.3 pg (ref 26.0–34.0)
MCHC: 33.2 g/dL (ref 30.0–36.0)
MCV: 88.3 fL (ref 78.0–100.0)
Monocytes Absolute: 0.3 10*3/uL (ref 0.1–1.0)
Monocytes Relative: 6 %
Neutro Abs: 2.8 10*3/uL (ref 1.7–7.7)
Neutrophils Relative %: 53 %
Platelets: 240 10*3/uL (ref 150–400)
RBC: 4.09 MIL/uL (ref 3.87–5.11)
RDW: 15.5 % (ref 11.5–15.5)
WBC: 5.4 10*3/uL (ref 4.0–10.5)

## 2017-01-02 LAB — COMPREHENSIVE METABOLIC PANEL
ALT: 17 U/L (ref 14–54)
AST: 19 U/L (ref 15–41)
Albumin: 4 g/dL (ref 3.5–5.0)
Alkaline Phosphatase: 73 U/L (ref 38–126)
Anion gap: 9 (ref 5–15)
BUN: 9 mg/dL (ref 6–20)
CO2: 23 mmol/L (ref 22–32)
Calcium: 9.4 mg/dL (ref 8.9–10.3)
Chloride: 108 mmol/L (ref 101–111)
Creatinine, Ser: 0.84 mg/dL (ref 0.44–1.00)
GFR calc Af Amer: >60 mL/min (ref >60)
GFR calc non Af Amer: >60 mL/min (ref >60)
Glucose, Bld: 94 mg/dL (ref 65–99)
Potassium: 3.7 mmol/L (ref 3.5–5.1)
Sodium: 140 mmol/L (ref 135–145)
Total Bilirubin: 0.6 mg/dL (ref 0.3–1.2)
Total Protein: 7.4 g/dL (ref 6.5–8.1)

## 2017-01-02 LAB — I-STAT BETA HCG BLOOD, ED (MC, WL, AP ONLY): I-stat hCG, quantitative: <5 m[IU]/mL (ref <5)

## 2017-01-02 MED ORDER — CEFTRIAXONE SODIUM 250 MG IJ SOLR: 250.0000 mg | Freq: Once | INTRAMUSCULAR | Status: DC

## 2017-01-02 MED ORDER — IBUPROFEN 800 MG PO TABS
800.0000 mg | ORAL_TABLET | Freq: Once | ORAL | Status: AC
  Administered 2017-01-02: 800 mg via ORAL
  Filled 2017-01-02: qty 1

## 2017-01-02 NOTE — Discharge Instructions (Signed)
Take antibiotic as directed.   You can take Tylenol or Ibuprofen as directed for pain. You can alternate Tylenol and Ibuprofen every 4 hours. If you take Tylenol at 1pm, then you can take Ibuprofen at 5pm. Then you can take Tylenol again at 9pm.   Follow-up with referred OB/GYN doctor for further evaluation.   Return to the emergency department for any worsening pain, vaginal discharge, vaginal bleeding, fevers, abdominal pain, vomiting or any other worsening or concerning symptoms.

## 2017-01-02 NOTE — ED Provider Notes (Signed)
[facility] DEPT Provider Note   CSN: [id number] Arrival date & time: 01/02/17  1012     History   Chief Complaint Chief Complaint  Patient presents with  . Vaginal Discharge    HPI Sherri [name] is a 31 y.o. female who presents with 3 days of vaginal pain, irritation and itching. Patient also reports having a thick, white discharge for the last 3 days. Patient also reporting some dysuria. She states she thinks she might have had some hematuria but is not sure. Patient also reporting some mild suprapubic abdominal tenderness. She describes as a "cramping" that is intermittent. She denies any triggers or exacerbating factors. She denies any alleviating or aggravating factors. She has been able to eat and drink normally. Patient is recently treated for gonorrhea and PID approximate one week ago. Given ceftriaxone in the department and discharged home with a prescription for doxycycline which she states she has been compliant with. Patient states that she did have sexual intercourse with her partner before she completed treatment and before he went to seek treatment.  Patient states that she is currently sexually active with one partner. They do not use any condoms and she is not on any birth control. She reports her LMP was approximately 3 weeks ago. Patient denies any fevers, chest pain, SOB, vomiting, vaginal bleeding.  The history is provided by the patient.    Past Medical History:  Diagnosis Date  . Fibromyalgia    denies today  . Hypertension   . 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 Urinary tract infection   . Uterine fibroid     Patient Active Problem List   Diagnosis Date Noted  . Yeast infection 12/03/2016  . Status post cesarean delivery 06/06/2014  . Uterine fibroid 12/25/2013  . Obesity 12/25/2013    Past Surgical History:  Procedure Laterality Date  . CESAREAN SECTION N/A 06/05/2014   Procedure: CESAREAN  SECTION;  Surgeon: Truett Mainland, DO;  Location: [facility];  Service: Obstetrics;  Laterality: N/A;  . WISDOM TOOTH EXTRACTION      OB History    Gravida Para Term Preterm AB Living   1 1   1   2    SAB TAB Ectopic Multiple Live Births         1 2       Home Medications    Prior to Admission medications   Medication Sig Start Date End Date Taking? Authorizing Provider  ondansetron (ZOFRAN ODT) 4 MG disintegrating tablet Take 1 tablet (4 mg total) by mouth every 8 (eight) hours as needed for nausea or vomiting. 12/26/16  Yes Street, [place], PA-C  doxycycline (VIBRAMYCIN) 100 MG capsule Take 1 capsule (100 mg total) by mouth 2 (two) times daily. One po bid x 14 days Patient not taking: Reported on 01/02/2017 12/27/16   Noe Gens, PA-C  ranitidine (ZANTAC) 150 MG tablet Take 1 tablet (150 mg total) by mouth 2 (two) times daily. Patient not taking: Reported on 01/02/2017 12/26/16   Street, [place], PA-C  sulfamethoxazole-trimethoprim (BACTRIM DS,SEPTRA DS) 800-160 MG tablet Take 1 tablet by mouth 2 (two) times daily. 30 day supply starting 10/16 12/15/16   [provider]    Family History Family History  Problem Relation Age of Onset  . Colon cancer Neg Hx   . Colon polyps Neg Hx   . Kidney disease Neg Hx   . Diabetes Neg Hx   . Esophageal cancer Neg Hx   .  Gallbladder disease Neg Hx   . Heart disease Neg Hx   . Asthma Neg Hx   . Cancer Neg Hx   . Stroke Neg Hx     Social History Social History  Substance Use Topics  . Smoking status: Never Smoker  . Smokeless tobacco: Never Used  . Alcohol use No     Comment: occasional when not pregnant     Allergies   Zyrtec [cetirizine]   Review of Systems Review of Systems  Constitutional: Negative for chills and fever.  Respiratory: Negative for shortness of breath.   Cardiovascular: Negative for chest pain.  Gastrointestinal: Positive for abdominal pain. Negative for nausea and vomiting.  Genitourinary:  Positive for pelvic pain and vaginal discharge. Negative for dysuria and hematuria.     Physical Exam Updated Vital Signs BP 128/77 (BP Location: Left Arm)   Pulse 70   Resp 16   LMP 12/19/2016 (Approximate)   SpO2 100%   Physical Exam  Constitutional: She is oriented to person, place, and time. She appears well-developed and well-nourished.  Sitting comfortably on examination table  HENT:  Head: Normocephalic and atraumatic.  Mouth/Throat: Oropharynx is clear and moist and mucous membranes are normal.  Eyes: Pupils are equal, round, and reactive to light. Conjunctivae, EOM and lids are normal.  Neck: Full passive range of motion without pain.  Cardiovascular: Normal rate, regular rhythm, normal heart sounds and normal pulses.  Exam reveals no gallop and no friction rub.   No murmur heard. Pulmonary/Chest: Effort normal and breath sounds normal.  Abdominal: Soft. Normal appearance and bowel sounds are normal. There is generalized tenderness and tenderness in the suprapubic area. There is no rigidity, no guarding, no CVA tenderness and no tenderness at McBurney's point.  Diffuse generalized abdominal tenderness with some mild localized suprapubic tenderness. No rigidity, guarding, rebounding. No tenderness at McBurney's point. No CVA tenderness bilaterally.  Genitourinary: Uterus normal. Cervix exhibits discharge. Cervix exhibits no friability.  Genitourinary Comments: The exam was performed with a chaperone present. Normal external female genitalia. No lesions, rash, or sores. There is some white discharge noted. She does have a minor fissure noted to the inferior vaginal opening. Patient does exhibit CMT and bilateral adnexal tenderness. Patient is able to tolerate the exam without difficulty and responds calmly "oh that hurts when asked." No adnexal masses bilaterally.   Musculoskeletal: Normal range of motion.  Neurological: She is alert and oriented to person, place, and time.  Skin:  Skin is warm and dry. Capillary refill takes less than 2 seconds.  Psychiatric: She has a normal mood and affect. Her speech is normal.  Nursing note and vitals reviewed.    ED Treatments / Results  Labs (all labs ordered are listed, but only abnormal results are displayed) Labs Reviewed  WET PREP, GENITAL - Abnormal; Notable for the following:       Result Value   WBC, Wet Prep HPF POC FEW (*)    All other components within normal limits  CBC WITH DIFFERENTIAL/PLATELET  COMPREHENSIVE METABOLIC PANEL  URINALYSIS, ROUTINE W REFLEX MICROSCOPIC  I-STAT BETA HCG BLOOD, ED ([facility], [facility], [facility] ONLY)  GC/CHLAMYDIA PROBE AMP ([place]) NOT AT [facility]    EKG  EKG Interpretation None       Radiology US Transvaginal Non-ob  Result Date: 01/02/2017 CLINICAL DATA:  Pelvic pain for 3 days. EXAM: TRANSABDOMINAL AND TRANSVAGINAL ULTRASOUND OF PELVIS TECHNIQUE: Both transabdominal and transvaginal ultrasound examinations of the pelvis were performed. Transabdominal technique was performed for  global imaging of the pelvis including uterus, ovaries, adnexal regions, and pelvic cul-de-sac. It was necessary to proceed with endovaginal exam following the transabdominal exam to visualize the uterus, endometrium and ovaries to better advantage. COMPARISON:  10/19/2015 FINDINGS: Uterus Measurements: 8.1 x 4.1 x 5.7 cm. No mass or focal lesion. Uterus has a septate versus arcuate configuration similar to the prior exam. Endometrium Thickness: 11 mm. There is a small amount of heterogeneous fluid within the endometrial canal. No endometrial mass. Right ovary Measurements: 3.5 x 1.8 x 2.4 cm. Normal appearance/no adnexal mass. Left ovary Measurements: 3.7 x 2.8 x 2.6 cm. Normal appearance/no adnexal mass. Other findings No abnormal free fluid. IMPRESSION: 1. Small amount of heterogeneous fluid within the endometrial canal. This is of unclear etiology and may be incidental. Consider endometritis if there are consistent  clinical symptoms. 2. Uterine configuration is septate or, less likely, arcuate. 3. No other abnormalities.  Normal ovaries and adnexa. Electronically Signed   By: Lajean Manes M.D.   On: 01/02/2017 12:47   US Pelvis Complete  Result Date: 01/02/2017 CLINICAL DATA:  Pelvic pain for 3 days. EXAM: TRANSABDOMINAL AND TRANSVAGINAL ULTRASOUND OF PELVIS TECHNIQUE: Both transabdominal and transvaginal ultrasound examinations of the pelvis were performed. Transabdominal technique was performed for global imaging of the pelvis including uterus, ovaries, adnexal regions, and pelvic cul-de-sac. It was necessary to proceed with endovaginal exam following the transabdominal exam to visualize the uterus, endometrium and ovaries to better advantage. COMPARISON:  10/19/2015 FINDINGS: Uterus Measurements: 8.1 x 4.1 x 5.7 cm. No mass or focal lesion. Uterus has a septate versus arcuate configuration similar to the prior exam. Endometrium Thickness: 11 mm. There is a small amount of heterogeneous fluid within the endometrial canal. No endometrial mass. Right ovary Measurements: 3.5 x 1.8 x 2.4 cm. Normal appearance/no adnexal mass. Left ovary Measurements: 3.7 x 2.8 x 2.6 cm. Normal appearance/no adnexal mass. Other findings No abnormal free fluid. IMPRESSION: 1. Small amount of heterogeneous fluid within the endometrial canal. This is of unclear etiology and may be incidental. Consider endometritis if there are consistent clinical symptoms. 2. Uterine configuration is septate or, less likely, arcuate. 3. No other abnormalities.  Normal ovaries and adnexa. Electronically Signed   By: Lajean Manes M.D.   On: 01/02/2017 12:47    Procedures Procedures (including critical care time)  Medications Ordered in ED Medications  ibuprofen (ADVIL,MOTRIN) tablet 800 mg (800 mg Oral Given 01/02/17 1353)     Initial Impression / Assessment and Plan / ED Course  I have reviewed the triage vital signs and the nursing  notes.  Pertinent labs & imaging results that were available during my care of the patient were reviewed by me and considered in my medical decision making (see chart for details).     31 y.o. F who presents with 3 days of vaginal pain, vaginal discharge. Also reports some dysuria and hematuria. LMP was approximately 3 weeks ago. Treated for PID and gonorrhea one week ago. She completed her antibiotics. Patient is afebrile, non-toxic appearing, sitting comfortably on examination table. Vital signs reviewed and stable. Consider pregnancy vs UTI vs STD vs ovarian cyst vs premenstrual.  Patient also has a history of uterine fibroids and pain could be secondary to presence of fibroids.  History/physical exam are not concerning for appendicitis or diverticulitis or ovarian torsion. Will check basic labs including CBC, CMP, UA, I-stat beta.   Records reviewed. Samples from 12/27/16 showed patient was negative for chlamydia or gonorrhea. She did  test positive for yeast. She was prescribed a Diflucan pill which patient states that she never picked up.  Pelvic exam as documented above. Patient calmly states "Oh, that hurts" when asked regarding CMT and adnexal tenderness but is able to tolerate the exam without any difficulty.  GU exam with low suspicion for PID.  No adnexal masses felt. No cervical friability. Patient does have some white discharge concerning for BV. Given adnexal tenderness, will obtain U/S imaging to rule out ovarian cyst.   Ultrasound reviewed. Negative for any acute abnormalities. Discussed results with patient. She is walking around the room without any difficulty. She has been able to tolerate by mouth in the department without any difficulty. Repeat abdominal exam. She is still expressing some mild suprapubic abdominal tenderness. No right lower quadrant tenderness. No tenderness at McBurney's point. Concern that this may be premenstrual cramping. Exam not concerning for appendicitis or  diverticulitis. Given lack of fever, lack of other symptoms, and reassuring lab work and physical exam, do not feel that further CT imaging is indicated at this point.  Given that patient just completed treatment for PID, her recent lab results were negative for any gonorrhea or chlamydia and the fact the patient was able to tolerate the exam, do not feel that patient needs repeat treatment for PID. Discussed patient with Dr. Ralene Bathe. Agreeable to plan.  Instructed patient to pick up the antibiotic prescribed for the previous yeast infection. Instructed patient follow-up with OB/GYN for further evaluation. Strict return precautions discussed. Patient expresses understanding and agreement to plan.    Final Clinical Impressions(s) / ED Diagnoses   Final diagnoses:  Vaginal discharge    New Prescriptions Discharge Medication List as of 01/02/2017  1:52 PM       Volanda Napoleon, PA-C 01/02/17 2125    Quintella Reichert, MD 01/04/17 [phone]

## 2017-01-02 NOTE — ED Triage Notes (Signed)
Patient here from home with complaints of vaginal pain, itching, irritation. Also reports thick white discharge.  LMP 2 weeks ago normal. Denies pregnancy.

## 2017-01-04 LAB — GC/CHLAMYDIA PROBE AMP (~~LOC~~) NOT AT ARMC
Chlamydia: NEGATIVE
Neisseria Gonorrhea: NEGATIVE

## 2017-01-07 ENCOUNTER — Ambulatory Visit (INDEPENDENT_AMBULATORY_CARE_PROVIDER_SITE_OTHER): Payer: PRIVATE HEALTH INSURANCE | Admitting: Family Medicine

## 2017-01-07 ENCOUNTER — Encounter: Payer: Self-pay | Admitting: Family Medicine

## 2017-01-07 VITALS — BP 120/70 | HR 61 | Ht 59.5 in | Wt 172.6 lb

## 2017-01-07 DIAGNOSIS — B3731 Acute candidiasis of vulva and vagina: Secondary | ICD-10-CM

## 2017-01-07 DIAGNOSIS — Z23 Encounter for immunization: Secondary | ICD-10-CM | POA: Diagnosis not present

## 2017-01-07 DIAGNOSIS — A6000 Herpesviral infection of urogenital system, unspecified: Secondary | ICD-10-CM | POA: Insufficient documentation

## 2017-01-07 DIAGNOSIS — B373 Candidiasis of vulva and vagina: Secondary | ICD-10-CM | POA: Diagnosis not present

## 2017-01-07 DIAGNOSIS — R10819 Abdominal tenderness, unspecified site: Secondary | ICD-10-CM

## 2017-01-07 DIAGNOSIS — I10 Essential (primary) hypertension: Secondary | ICD-10-CM | POA: Diagnosis not present

## 2017-01-07 DIAGNOSIS — E669 Obesity, unspecified: Secondary | ICD-10-CM | POA: Diagnosis not present

## 2017-01-07 DIAGNOSIS — Z86018 Personal history of other benign neoplasm: Secondary | ICD-10-CM

## 2017-01-07 DIAGNOSIS — Z87898 Personal history of other specified conditions: Secondary | ICD-10-CM | POA: Diagnosis not present

## 2017-01-07 DIAGNOSIS — Z0000 Encounter for general adult medical examination without abnormal findings: Secondary | ICD-10-CM | POA: Diagnosis not present

## 2017-01-07 DIAGNOSIS — Z8742 Personal history of other diseases of the female genital tract: Secondary | ICD-10-CM

## 2017-01-07 LAB — POCT URINALYSIS DIP (PROADVANTAGE DEVICE)
Bilirubin, UA: NEGATIVE
Blood, UA: NEGATIVE
Glucose, UA: NEGATIVE mg/dL
Ketones, POC UA: NEGATIVE mg/dL
Leukocytes, UA: NEGATIVE
Nitrite, UA: NEGATIVE
Protein Ur, POC: NEGATIVE mg/dL
Specific Gravity, Urine: 1.03
Urobilinogen, Ur: NEGATIVE
pH, UA: 6 (ref 5.0–8.0)

## 2017-01-07 MED ORDER — FLUCONAZOLE 150 MG PO TABS: 150.0000 mg | ORAL_TABLET | Freq: Once | ORAL | 0 refills | Status: AC

## 2017-01-07 NOTE — Progress Notes (Signed)
Subjective:    Patient ID: Sherri [name], female    DOB: Apr 19, 1985, 31 y.o.   MRN: [id number]  HPI Chief Complaint  Patient presents with  . new pt    new pt fasting cpe. pap was a year ago abnormal needs another pap today. eye exam done 4 months ago at Dr. Einar Gip office. will get flu shot today   She is new to the practice and here for a complete physical exam. States she usually goes to the ED or Health Department for her care.  Last CPE: 1 year ago  States she [facility] to [facility] ED with lower abdominal pain, vaginal discharge and itching 5 days ago and was treated for a yeast infection. States she took Diflucan 3 days ago. States symptoms are not improving and not worsening.  Reports history of recurrent BV, yeast infections and history of abnormal pap smear.  STD testing done and negative.   US done and report as follows:  IMPRESSION: 1. Small amount of heterogeneous fluid within the endometrial canal. This is of unclear etiology and may be incidental. Consider endometritis if there are consistent clinical symptoms. 2. Uterine configuration is septate or, less likely, arcuate. 3. No other abnormalities.  Normal ovaries and adnexa.  Electronically Signed   By: Lajean Manes M.D.   On: 01/02/2017 12:47  HTN- is not on any medication, last took medication 2 years ago. Diagnosed 2 years ago during her pregnancy.  Fibromyalgia? Not sure who or when this diagnosis was entered. She does not know what this diagnosis means. Denies chronic pain.   She would like a flu shot.   No other concerns or questions.   Social history: Lives with her 51 year old kids, twins, works at [employer]: unhealthy diet Excerise: none   Immunizations: up to date  Health maintenance:  Mammogram: never  Colonoscopy: never  Last Gynecological Exam: pap smear- 07/18/2014 and due in 2019 Last Menstrual cycle: 3 weeks ago and regular but heavy periods.  Contraception: Condoms Pregnancies: 1  -twins- 2 boys.   Last Dental Exam: has upcoming appointment  Last Eye Exam: 3 months ago   Wears seatbelt always, uses smoke detectors in home and functioning, does not text while driving and feels safe in home environment.   Reviewed allergies, medications, past medical, surgical, family, and social history.   Review of Systems Review of Systems Constitutional: -fever, -chills, -sweats, -unexpected weight change,-fatigue ENT: -runny nose, -ear pain, -sore throat Cardiology:  -chest pain, -palpitations, -edema Respiratory: -cough, -shortness of breath, -wheezing Gastroenterology: -abdominal pain, -nausea, -vomiting, -diarrhea, -constipation  Hematology: -bleeding or bruising problems Musculoskeletal: -arthralgias, -myalgias, -joint swelling, -back pain Ophthalmology: -vision changes Urology: -dysuria, -difficulty urinating, -hematuria, +urinary frequency, -urgency Neurology: -headache, -weakness, -tingling, -numbness       Objective:   Physical Exam BP 120/70   Pulse 61   Ht 4' 11.5" (1.511 m)   Wt 172 lb 9.6 oz (78.3 kg)   LMP 12/19/2016   BMI 34.28 kg/m   General Appearance:    Alert, cooperative, no distress, appears stated age  Head:    Normocephalic, without obvious abnormality, atraumatic  Eyes:    PERRL, conjunctiva/corneas clear, EOM's intact, fundi    benign  Ears:    Normal TM's and external ear canals  Nose:   Nares normal, mucosa normal, no drainage or sinus   tenderness  Throat:   Lips, mucosa, and tongue normal; teeth and gums normal  Neck:   Supple,  no lymphadenopathy;  thyroid:  no   enlargement/tenderness/nodules; no carotid   bruit or JVD  Back:    Spine nontender, no curvature, ROM normal, no CVA     tenderness  Lungs:     Clear to auscultation bilaterally without wheezes, rales or     ronchi; respirations unlabored  Chest Wall:    No tenderness or deformity   Heart:    Regular rate and rhythm, S1 and S2 normal, no murmur, rub   or gallop  Breast  Exam:    Not done. OB/GYN  Abdomen:     Soft, tenderness lower abdomen and suprapubic area without rebound, abdomen is nondistended, normoactive bowel sounds,    no masses, no hepatosplenomegaly  Genitalia:    Not done. Pelvic exam with US done 5 days ago. OB/GYN referral. Pap up to date.   Rectal:    Not performed due to age<40 and no related complaints  Extremities:   No clubbing, cyanosis or edema  Pulses:   2+ and symmetric all extremities  Skin:   Skin color, texture, turgor normal, no rashes or lesions  Lymph nodes:   Cervical, supraclavicular, and axillary nodes normal  Neurologic:   CNII-XII intact, normal strength, sensation and gait; reflexes 2+ and symmetric throughout          Psych:   Normal mood, affect, hygiene and grooming.    Urinalysis dipstick: negative      Assessment & Plan:  Routine general medical examination at a health care facility - Plan: POCT Urinalysis DIP (Proadvantage Device), TSH, Lipid panel  Essential hypertension  Obesity (BMI 30-39.9) - Plan: TSH, Lipid panel  Lower abdominal tenderness - Plan: Ambulatory referral to Obstetrics / Gynecology  Vaginal candida - Plan: Ambulatory referral to Obstetrics / Gynecology, fluconazole (DIFLUCAN) 150 MG tablet  History of abnormal cervical Pap smear - Plan: Ambulatory referral to Obstetrics / Gynecology  History of uterine fibroid - Plan: Ambulatory referral to Obstetrics / Gynecology  Needs flu shot - Plan: Flu Vaccine QUAD 36+ mos IM  Reviewed notes and results from ED visit on 01/02/2017 with patient. She continues having chronic lower abdominal pain. Referral to OB/GYN for further evaluation of symptoms and for abnormal uterine US.  Diflucan refilled.  Discussed that her BP is in goal range.  Counseled on healthy diet and exercise.  Discussed safety and health promotion.  Flu shot given.  Follow up pending labs.

## 2017-01-07 NOTE — Patient Instructions (Addendum)
Take the 2nd dose of Diflucan. I am referring you to an OB/GYN and you should receive a call to schedule an appointment.  Start eating a healthy diet and cut back on fried and fast food.  Start exercising.   We will call you with your lab results.   Preventative Care for Adults - Female      MAINTAIN REGULAR HEALTH EXAMS:  A routine yearly physical is a good way to check in with your primary care provider about your health and preventive screening. It is also an opportunity to share updates about your health and any concerns you have, and receive a thorough all-over exam.   Most health insurance companies pay for at least some preventative services.  Check with your health plan for specific coverages.  WHAT PREVENTATIVE SERVICES DO WOMEN NEED?  Adult women should have their weight and blood pressure checked regularly.   Women age 63 and older should have their cholesterol levels checked regularly.  Women should be screened for cervical cancer with a Pap smear and pelvic exam beginning at age 30.   Breast cancer screening generally begins at age 52 with a mammogram and breast exam by your primary care provider.    Beginning at age 42 and continuing to age 79, women should be screened for colorectal cancer.  Certain people may need continued testing until age 71.  Updating vaccinations is part of preventative care.  Vaccinations help protect against diseases such as the flu.  Osteoporosis is a disease in which the bones lose minerals and strength as we age. Women ages 24 and over should discuss this with their caregivers, as should women after menopause who have other risk factors.  Lab tests are generally done as part of preventative care to screen for anemia and blood disorders, to screen for problems with the kidneys and liver, to screen for bladder problems, to check blood sugar, and to check your cholesterol level.  Preventative services generally include counseling about diet,  exercise, avoiding tobacco, drugs, excessive alcohol consumption, and sexually transmitted infections.    GENERAL RECOMMENDATIONS FOR GOOD HEALTH:  Healthy diet:  Eat a variety of foods, including fruit, vegetables, animal or vegetable protein, such as meat, fish, chicken, and eggs, or beans, lentils, tofu, and grains, such as rice.  Drink plenty of water daily.  Decrease saturated fat in the diet, avoid lots of red meat, processed foods, sweets, fast foods, and fried foods.  Exercise:  Aerobic exercise helps maintain good heart health. At least 30-40 minutes of moderate-intensity exercise is recommended. For example, a brisk walk that increases your heart rate and breathing. This should be done on most days of the week.   Find a type of exercise or a variety of exercises that you enjoy so that it becomes a part of your daily life.  Examples are running, walking, swimming, water aerobics, and biking.  For motivation and support, explore group exercise such as aerobic class, spin class, Zumba, Yoga,or  martial arts, etc.    Set exercise goals for yourself, such as a certain weight goal, walk or run in a race such as a 5k walk/run.  Speak to your primary care provider about exercise goals.  Disease prevention:  If you smoke or chew tobacco, find out from your caregiver how to quit. It can literally save your life, no matter how long you have been a tobacco user. If you do not use tobacco, never begin.   Maintain a healthy diet and normal  weight. Increased weight leads to problems with blood pressure and diabetes.   The Body Mass Index or BMI is a way of measuring how much of your body is fat. Having a BMI above 27 increases the risk of heart disease, diabetes, hypertension, stroke and other problems related to obesity. Your caregiver can help determine your BMI and based on it develop an exercise and dietary program to help you achieve or maintain this important measurement at a healthful  level.  High blood pressure causes heart and blood vessel problems.  Persistent high blood pressure should be treated with medicine if weight loss and exercise do not work.   Fat and cholesterol leaves deposits in your arteries that can block them. This causes heart disease and vessel disease elsewhere in your body.  If your cholesterol is found to be high, or if you have heart disease or certain other medical conditions, then you may need to have your cholesterol monitored frequently and be treated with medication.   Ask if you should have a cardiac stress test if your history suggests this. A stress test is a test done on a treadmill that looks for heart disease. This test can find disease prior to there being a problem.  Menopause can be associated with physical symptoms and risks. Hormone replacement therapy is available to decrease these. You should talk to your caregiver about whether starting or continuing to take hormones is right for you.   Osteoporosis is a disease in which the bones lose minerals and strength as we age. This can result in serious bone fractures. Risk of osteoporosis can be identified using a bone density scan. Women ages 51 and over should discuss this with their caregivers, as should women after menopause who have other risk factors. Ask your caregiver whether you should be taking a calcium supplement and Vitamin D, to reduce the rate of osteoporosis.   Avoid drinking alcohol in excess (more than two drinks per day).  Avoid use of street drugs. Do not share needles with anyone. Ask for professional help if you need assistance or instructions on stopping the use of alcohol, cigarettes, and/or drugs.  Brush your teeth twice a day with fluoride toothpaste, and floss once a day. Good oral hygiene prevents tooth decay and gum disease. The problems can be painful, unattractive, and can cause other health problems. Visit your dentist for a routine oral and dental check up and  preventive care every 6-12 months.   Look at your skin regularly.  Use a mirror to look at your back. Notify your caregivers of changes in moles, especially if there are changes in shapes, colors, a size larger than a pencil eraser, an irregular border, or development of new moles.  Safety:  Use seatbelts 100% of the time, whether driving or as a passenger.  Use safety devices such as hearing protection if you work in environments with loud noise or significant background noise.  Use safety glasses when doing any work that could send debris in to the eyes.  Use a helmet if you ride a bike or motorcycle.  Use appropriate safety gear for contact sports.  Talk to your caregiver about gun safety.  Use sunscreen with a SPF (or skin protection factor) of 15 or greater.  Lighter skinned people are at a greater risk of skin cancer. Don't forget to also wear sunglasses in order to protect your eyes from too much damaging sunlight. Damaging sunlight can accelerate cataract formation.   Practice safe sex.  Use condoms. Condoms are used for birth control and to help reduce the spread of sexually transmitted infections (or STIs).  Some of the STIs are gonorrhea (the clap), chlamydia, syphilis, trichomonas, herpes, HPV (human papilloma virus) and HIV (human immunodeficiency virus) which causes AIDS. The herpes, HIV and HPV are viral illnesses that have no cure. These can result in disability, cancer and death.   Keep carbon monoxide and smoke detectors in your home functioning at all times. Change the batteries every 6 months or use a model that plugs into the wall.   Vaccinations:  Stay up to date with your tetanus shots and other required immunizations. You should have a booster for tetanus every 10 years. Be sure to get your flu shot every year, since 5%-20% of the [place] population comes down with the flu. The flu vaccine changes each year, so being vaccinated once is not enough. Get your shot in the fall, before  the flu season peaks.   Other vaccines to consider:  Human Papilloma Virus or HPV causes cancer of the cervix, and other infections that can be transmitted from person to person. There is a vaccine for HPV, and females should get immunized between the ages of 40 and 29. It requires a series of 3 shots.   Pneumococcal vaccine to protect against certain types of pneumonia.  This is normally recommended for adults age 65 or older.  However, adults younger than 30 years old with certain underlying conditions such as diabetes, heart or lung disease should also receive the vaccine.  Shingles vaccine to protect against Varicella Zoster if you are older than age 85, or younger than 30 years old with certain underlying illness.  Hepatitis A vaccine to protect against a form of infection of the liver by a virus acquired from food.  Hepatitis B vaccine to protect against a form of infection of the liver by a virus acquired from blood or body fluids, particularly if you work in health care.  If you plan to travel internationally, check with your local health department for specific vaccination recommendations.  Cancer Screening:  Breast cancer screening is essential to preventive care for women. All women age 73 and older should perform a breast self-exam every month. At age 3 and older, women should have their caregiver complete a breast exam each year. Women at ages 36 and older should have a mammogram (x-ray film) of the breasts. Your caregiver can discuss how often you need mammograms.    Cervical cancer screening includes taking a Pap smear (sample of cells examined under a microscope) from the cervix (end of the uterus). It also includes testing for HPV (Human Papilloma Virus, which can cause cervical cancer). Screening and a pelvic exam should begin at age 82, or 3 years after a woman becomes sexually active. Screening should occur every year, with a Pap smear but no HPV testing, up to age 13. After  age 50, you should have a Pap smear every 3 years with HPV testing, if no HPV was found previously.   Most routine colon cancer screening begins at the age of 64. On a yearly basis, doctors may provide special easy to use take-home tests to check for hidden blood in the stool. Sigmoidoscopy or colonoscopy can detect the earliest forms of colon cancer and is life saving. These tests use a small camera at the end of a tube to directly examine the colon. Speak to your caregiver about this at age 52, when routine screening begins (  and is repeated every 5 years unless early forms of pre-cancerous polyps or small growths are found).

## 2017-01-08 ENCOUNTER — Other Ambulatory Visit: Payer: PRIVATE HEALTH INSURANCE

## 2017-01-09 LAB — LIPID PANEL
Cholesterol: 198 mg/dL (ref <200)
HDL: 56 mg/dL (ref >50)
LDL Cholesterol (Calc): 128 mg/dL (calc) — ABNORMAL HIGH
Non-HDL Cholesterol (Calc): 142 mg/dL (calc) — ABNORMAL HIGH (ref <130)
Total CHOL/HDL Ratio: 3.5 (calc) (ref <5.0)
Triglycerides: 57 mg/dL (ref <150)

## 2017-01-09 LAB — TSH: TSH: 0.88 mIU/L

## 2017-01-12 ENCOUNTER — Other Ambulatory Visit: Payer: Self-pay | Admitting: Family Medicine

## 2017-01-12 ENCOUNTER — Other Ambulatory Visit (HOSPITAL_COMMUNITY)
Admission: RE | Admit: 2017-01-12 | Discharge: 2017-01-12 | Disposition: A | Discharge status: Home / Self Care | Payer: PRIVATE HEALTH INSURANCE | Source: Ambulatory Visit | Attending: Family Medicine | Admitting: Family Medicine

## 2017-01-12 DIAGNOSIS — N760 Acute vaginitis: Secondary | ICD-10-CM | POA: Diagnosis present

## 2017-01-12 DIAGNOSIS — Z113 Encounter for screening for infections with a predominantly sexual mode of transmission: Secondary | ICD-10-CM | POA: Diagnosis present

## 2017-01-15 LAB — URINE CYTOLOGY ANCILLARY ONLY
Bacterial vaginitis: NEGATIVE
Candida vaginitis: NEGATIVE
Chlamydia: NEGATIVE
Neisseria Gonorrhea: NEGATIVE
Trichomonas: NEGATIVE

## 2017-01-25 ENCOUNTER — Ambulatory Visit: Payer: PRIVATE HEALTH INSURANCE | Admitting: Nurse Practitioner

## 2017-02-02 ENCOUNTER — Emergency Department (HOSPITAL_COMMUNITY)
Admission: EM | Admit: 2017-02-02 | Discharge: 2017-02-02 | Disposition: A | Discharge status: Home / Self Care | Payer: PRIVATE HEALTH INSURANCE | Attending: Emergency Medicine | Admitting: Emergency Medicine

## 2017-02-02 ENCOUNTER — Encounter (HOSPITAL_COMMUNITY): Payer: Self-pay | Admitting: Emergency Medicine

## 2017-02-02 ENCOUNTER — Other Ambulatory Visit: Payer: Self-pay

## 2017-02-02 DIAGNOSIS — B9789 Other viral agents as the cause of diseases classified elsewhere: Secondary | ICD-10-CM | POA: Diagnosis not present

## 2017-02-02 DIAGNOSIS — J069 Acute upper respiratory infection, unspecified: Secondary | ICD-10-CM | POA: Diagnosis not present

## 2017-02-02 DIAGNOSIS — R112 Nausea with vomiting, unspecified: Secondary | ICD-10-CM | POA: Diagnosis present

## 2017-02-02 DIAGNOSIS — I10 Essential (primary) hypertension: Secondary | ICD-10-CM | POA: Diagnosis not present

## 2017-02-02 MED ORDER — ONDANSETRON 4 MG PO TBDP: 4.0000 mg | ORAL_TABLET | Freq: Three times a day (TID) | ORAL | 0 refills | Status: DC | PRN

## 2017-02-02 MED ORDER — BENZONATATE 100 MG PO CAPS: 100.0000 mg | ORAL_CAPSULE | Freq: Three times a day (TID) | ORAL | 0 refills | Status: DC

## 2017-02-02 NOTE — ED Notes (Signed)
Bed: [facility]PT1 Expected date:  Expected time:  Means of arrival:  Comments: 

## 2017-02-02 NOTE — Discharge Instructions (Signed)
Return if any problems.  See your Physician if symptoms persist °

## 2017-02-02 NOTE — ED Notes (Signed)
Pt is alert and oriented x 4 and is verbally responsive and very polite. Pt reports that she has had n/v x 3 days and has been around her children in whom have been sick. Pt reports centralized chest pain that occurs with coughing and sneezing/. Pt denies any SOB.

## 2017-02-02 NOTE — ED Triage Notes (Signed)
URI symptoms for a few days. She has vomited a few times today. Pt is alert and oriented. Denies fever or chest discomfort.

## 2017-02-02 NOTE — ED Notes (Signed)
Bed: [facility]PT4 Expected date:  Expected time:  Means of arrival:  Comments: 

## 2017-02-04 NOTE — ED Provider Notes (Signed)
[facility] DEPT Provider Note   CSN: [id number] Arrival date & time: 02/02/17  1715     History   Chief Complaint Chief Complaint  Patient presents with  . URI  . Emesis    HPI Sherri [name] is a 31 y.o. female.  The history is provided by the patient. No language interpreter was used.  URI   This is a new problem. The current episode started more than 2 days ago. The problem has been rapidly worsening. There has been no fever. Associated symptoms include vomiting. The treatment provided no relief.  Emesis   Associated symptoms include URI.    Past Medical History:  Diagnosis Date  . Fibromyalgia    denies today  . Genital herpes   . Hypertension   . 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 Urinary tract infection   . Uterine fibroid     Patient Active Problem List   Diagnosis Date Noted  . Hypertension   . Genital herpes   . Yeast infection 12/03/2016  . Status post cesarean delivery 06/06/2014  . Uterine fibroid 12/25/2013  . Obesity 12/25/2013    Past Surgical History:  Procedure Laterality Date  . CESAREAN SECTION N/A 06/05/2014   Procedure: CESAREAN SECTION;  Surgeon: Truett Mainland, DO;  Location: [facility];  Service: Obstetrics;  Laterality: N/A;  . WISDOM TOOTH EXTRACTION      OB History    Gravida Para Term Preterm AB Living   1 1   1   2    SAB TAB Ectopic Multiple Live Births         1 2       Home Medications    Prior to Admission medications   Medication Sig Start Date End Date Taking? Authorizing Provider  benzonatate (TESSALON) 100 MG capsule Take 1 capsule (100 mg total) by mouth every 8 (eight) hours. 02/02/17   Fransico Meadow, PA-C  ondansetron (ZOFRAN ODT) 4 MG disintegrating tablet Take 1 tablet (4 mg total) by mouth every 8 (eight) hours as needed for nausea or vomiting. 02/02/17   Fransico Meadow, PA-C    Family History Family History  Problem Relation Age of Onset  .  Hypertension Mother   . Hypertension Father   . Breast cancer Maternal Grandmother        53  . Colon cancer Neg Hx   . Colon polyps Neg Hx   . Kidney disease Neg Hx   . Diabetes Neg Hx   . Esophageal cancer Neg Hx   . Gallbladder disease Neg Hx   . Heart disease Neg Hx   . Asthma Neg Hx   . Cancer Neg Hx   . Stroke Neg Hx     Social History Social History   Tobacco Use  . Smoking status: Never Smoker  . Smokeless tobacco: Never Used  Substance Use Topics  . Alcohol use: No    Alcohol/week: 0.0 oz    Comment: occasional when not pregnant  . Drug use: No     Allergies   Patient has no known allergies.   Review of Systems Review of Systems  Gastrointestinal: Positive for vomiting.  All other systems reviewed and are negative.    Physical Exam Updated Vital Signs BP 131/90 (BP Location: Left Arm)   Pulse 88   Temp 98.7 F (37.1 C) (Oral)   Resp 20   Ht 4\' 11"  (1.499 m)   Wt 77.1 kg (170  lb)   SpO2 100%   BMI 34.34 kg/m   Physical Exam  Constitutional: She is oriented to person, place, and time. She appears well-developed and well-nourished.  HENT:  Head: Normocephalic.  Right Ear: External ear normal.  Left Ear: External ear normal.  Mouth/Throat: Oropharynx is clear and moist.  Eyes: EOM are normal. Pupils are equal, round, and reactive to light.  Neck: Normal range of motion.  Pulmonary/Chest: Effort normal.  Abdominal: She exhibits no distension.  Musculoskeletal: Normal range of motion.  Neurological: She is alert and oriented to person, place, and time.  Psychiatric: She has a normal mood and affect.  Nursing note and vitals reviewed.    ED Treatments / Results  Labs (all labs ordered are listed, but only abnormal results are displayed) Labs Reviewed - No data to display  EKG  EKG Interpretation None       Radiology No results found.  Procedures Procedures (including critical care time)  Medications Ordered in ED Medications  - No data to display   Initial Impression / Assessment and Plan / ED Course  I have reviewed the triage vital signs and the nursing notes.  Pertinent labs & imaging results that were available during my care of the patient were reviewed by me and considered in my medical decision making (see chart for details).     I counseled on viral illness and need for follow up  Final Clinical Impressions(s) / ED Diagnoses   Final diagnoses:  Viral upper respiratory tract infection    ED Discharge Orders        Ordered    benzonatate (TESSALON) 100 MG capsule  Every 8 hours     02/02/17 2024    ondansetron (ZOFRAN ODT) 4 MG disintegrating tablet  Every 8 hours PRN     02/02/17 2024       Fransico Meadow, Hershal Coria 02/04/17 1730    Drenda Freeze, MD 02/05/17 [phone]

## 2017-02-15 ENCOUNTER — Ambulatory Visit: Payer: PRIVATE HEALTH INSURANCE | Admitting: Obstetrics and Gynecology

## 2017-02-26 ENCOUNTER — Encounter (HOSPITAL_COMMUNITY): Payer: Self-pay | Admitting: Emergency Medicine

## 2017-02-26 ENCOUNTER — Emergency Department (HOSPITAL_COMMUNITY)
Admission: EM | Admit: 2017-02-26 | Discharge: 2017-02-26 | Disposition: A | Discharge status: Home / Self Care | Payer: PRIVATE HEALTH INSURANCE | Attending: Emergency Medicine | Admitting: Emergency Medicine

## 2017-02-26 DIAGNOSIS — I10 Essential (primary) hypertension: Secondary | ICD-10-CM | POA: Insufficient documentation

## 2017-02-26 DIAGNOSIS — Z79899 Other long term (current) drug therapy: Secondary | ICD-10-CM | POA: Diagnosis not present

## 2017-02-26 DIAGNOSIS — J069 Acute upper respiratory infection, unspecified: Secondary | ICD-10-CM | POA: Diagnosis not present

## 2017-02-26 DIAGNOSIS — R0981 Nasal congestion: Secondary | ICD-10-CM | POA: Diagnosis present

## 2017-02-26 MED ORDER — PROMETHAZINE-DM 6.25-15 MG/5ML PO SYRP: 5.0000 mL | ORAL_SOLUTION | Freq: Four times a day (QID) | ORAL | 0 refills | Status: DC | PRN

## 2017-02-26 MED ORDER — BENZONATATE 100 MG PO CAPS: 100.0000 mg | ORAL_CAPSULE | Freq: Three times a day (TID) | ORAL | 0 refills | Status: DC

## 2017-02-26 NOTE — ED Triage Notes (Signed)
Patient c/o sore throat and nasal congestion for past 3 days. Denies fevers.

## 2017-02-26 NOTE — ED Provider Notes (Signed)
[facility] DEPT Provider Note   CSN: [id number] Arrival date & time: 02/26/17  1349     History   Chief Complaint Chief Complaint  Patient presents with  . Sore Throat  . Nasal Congestion    HPI Sherri [name] is a 31 y.o. female.  HPI   31 year old female presenting with cold symptoms.  For the past 2-3 days patient has had sinus congestion, occasional sneezing, throat irritation, and not feeling well.  No report of fever, chills, headache, chest pain, trouble breathing, productive cough or rash.  States her kids are sick with similar symptoms.  She denies any specific treatment tried except Hall's cough medication.  Past Medical History:  Diagnosis Date  . Fibromyalgia    denies today  . Genital herpes   . Hypertension   . 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 Urinary tract infection   . Uterine fibroid     Patient Active Problem List   Diagnosis Date Noted  . Hypertension   . Genital herpes   . Yeast infection 12/03/2016  . Status post cesarean delivery 06/06/2014  . Uterine fibroid 12/25/2013  . Obesity 12/25/2013    Past Surgical History:  Procedure Laterality Date  . CESAREAN SECTION N/A 06/05/2014   Procedure: CESAREAN SECTION;  Surgeon: Truett Mainland, DO;  Location: [facility];  Service: Obstetrics;  Laterality: N/A;  . WISDOM TOOTH EXTRACTION      OB History    Gravida Para Term Preterm AB Living   1 1   1   2    SAB TAB Ectopic Multiple Live Births         1 2       Home Medications    Prior to Admission medications   Medication Sig Start Date End Date Taking? Authorizing Provider  benzonatate (TESSALON) 100 MG capsule Take 1 capsule (100 mg total) by mouth every 8 (eight) hours. 02/02/17   Fransico Meadow, PA-C  ondansetron (ZOFRAN ODT) 4 MG disintegrating tablet Take 1 tablet (4 mg total) by mouth every 8 (eight) hours as needed for nausea or vomiting. 02/02/17   Fransico Meadow, PA-C     Family History Family History  Problem Relation Age of Onset  . Hypertension Mother   . Hypertension Father   . Breast cancer Maternal Grandmother        74  . Colon cancer Neg Hx   . Colon polyps Neg Hx   . Kidney disease Neg Hx   . Diabetes Neg Hx   . Esophageal cancer Neg Hx   . Gallbladder disease Neg Hx   . Heart disease Neg Hx   . Asthma Neg Hx   . Cancer Neg Hx   . Stroke Neg Hx     Social History Social History   Tobacco Use  . Smoking status: Never Smoker  . Smokeless tobacco: Never Used  Substance Use Topics  . Alcohol use: No    Alcohol/week: 0.0 oz    Comment: occasional when not pregnant  . Drug use: No     Allergies   Patient has no known allergies.   Review of Systems Review of Systems  All other systems reviewed and are negative.    Physical Exam Updated Vital Signs BP 135/70 (BP Location: Right Arm)   Pulse 87   Temp 98.5 F (36.9 C) (Oral)   Resp 18   LMP 02/05/2017   SpO2 100%   Physical Exam  Constitutional: She appears well-developed and well-nourished. No distress.  HENT:  Head: Atraumatic.  Right Ear: Tympanic membrane normal.  Left Ear: Tympanic membrane normal.  Mouth/Throat: Uvula is midline, oropharynx is clear and moist and mucous membranes are normal.  Eyes: Conjunctivae and EOM are normal. Pupils are equal, round, and reactive to light.  Neck: Normal range of motion. Neck supple.  Cardiovascular: Normal rate and regular rhythm.  Pulmonary/Chest: Effort normal and breath sounds normal.  Abdominal: There is no tenderness.  Lymphadenopathy:    She has no cervical adenopathy.  Neurological: She is alert.  Skin: No rash noted.  Psychiatric: She has a normal mood and affect.  Nursing note and vitals reviewed.    ED Treatments / Results  Labs (all labs ordered are listed, but only abnormal results are displayed) Labs Reviewed - No data to display  EKG  EKG Interpretation None       Radiology No results  found.  Procedures Procedures (including critical care time)  Medications Ordered in ED Medications - No data to display   Initial Impression / Assessment and Plan / ED Course  I have reviewed the triage vital signs and the nursing notes.  Pertinent labs & imaging results that were available during my care of the patient were reviewed by me and considered in my medical decision making (see chart for details).     BP 135/70 (BP Location: Right Arm)   Pulse 87   Temp 98.5 F (36.9 C) (Oral)   Resp 18   LMP 02/05/2017   SpO2 100%    Final Clinical Impressions(s) / ED Diagnoses   Final diagnoses:  Viral upper respiratory tract infection    ED Discharge Orders        Ordered    benzonatate (TESSALON) 100 MG capsule  Every 8 hours     02/26/17 1443    promethazine-dextromethorphan (PROMETHAZINE-DM) 6.25-15 MG/5ML syrup  4 times daily PRN     02/26/17 1443     2:39 PM Pt symptoms consistent with URI. Pt will be discharged with symptomatic treatment.  Discussed return precautions.  Pt is hemodynamically stable & in NAD prior to discharge.    Domenic Moras, PA-C 02/26/17 1444    Lajean Saver, MD 02/26/17 1537

## 2017-03-20 ENCOUNTER — Encounter (HOSPITAL_COMMUNITY): Payer: Self-pay | Admitting: *Deleted

## 2017-03-20 ENCOUNTER — Emergency Department (HOSPITAL_COMMUNITY)
Admission: EM | Admit: 2017-03-20 | Discharge: 2017-03-20 | Disposition: A | Discharge status: Home / Self Care | Payer: PRIVATE HEALTH INSURANCE | Attending: Emergency Medicine | Admitting: Emergency Medicine

## 2017-03-20 ENCOUNTER — Emergency Department (HOSPITAL_COMMUNITY): Payer: PRIVATE HEALTH INSURANCE

## 2017-03-20 DIAGNOSIS — I10 Essential (primary) hypertension: Secondary | ICD-10-CM | POA: Insufficient documentation

## 2017-03-20 DIAGNOSIS — R079 Chest pain, unspecified: Secondary | ICD-10-CM | POA: Diagnosis present

## 2017-03-20 DIAGNOSIS — R0789 Other chest pain: Secondary | ICD-10-CM | POA: Diagnosis not present

## 2017-03-20 LAB — BASIC METABOLIC PANEL
Anion gap: 10 (ref 5–15)
BUN: 10 mg/dL (ref 6–20)
CO2: 23 mmol/L (ref 22–32)
Calcium: 8.9 mg/dL (ref 8.9–10.3)
Chloride: 106 mmol/L (ref 101–111)
Creatinine, Ser: 0.86 mg/dL (ref 0.44–1.00)
GFR calc Af Amer: >60 mL/min (ref >60)
GFR calc non Af Amer: >60 mL/min (ref >60)
Glucose, Bld: 96 mg/dL (ref 65–99)
Potassium: 3.7 mmol/L (ref 3.5–5.1)
Sodium: 139 mmol/L (ref 135–145)

## 2017-03-20 LAB — CBC
HCT: 38.2 % (ref 36.0–46.0)
Hemoglobin: 12.4 g/dL (ref 12.0–15.0)
MCH: 28.7 pg (ref 26.0–34.0)
MCHC: 32.5 g/dL (ref 30.0–36.0)
MCV: 88.4 fL (ref 78.0–100.0)
Platelets: 237 10*3/uL (ref 150–400)
RBC: 4.32 MIL/uL (ref 3.87–5.11)
RDW: 14.3 % (ref 11.5–15.5)
WBC: 6.7 10*3/uL (ref 4.0–10.5)

## 2017-03-20 LAB — I-STAT BETA HCG BLOOD, ED (MC, WL, AP ONLY): I-stat hCG, quantitative: <5 m[IU]/mL (ref <5)

## 2017-03-20 LAB — I-STAT TROPONIN, ED
Troponin i, poc: 0 ng/mL (ref 0.00–0.08)
Troponin i, poc: 0 ng/mL (ref 0.00–0.08)

## 2017-03-20 LAB — D-DIMER, QUANTITATIVE: D-Dimer, Quant: 0.28 ug/mL-FEU (ref 0.00–0.50)

## 2017-03-20 MED ORDER — MELOXICAM 15 MG PO TABS: 15.0000 mg | ORAL_TABLET | Freq: Every day | ORAL | 0 refills | Status: DC

## 2017-03-20 NOTE — Discharge Instructions (Signed)
Mobic for pain and inflammation as prescribed. Follow up with family doctor for recheck.

## 2017-03-20 NOTE — ED Provider Notes (Signed)
[facility] EMERGENCY DEPARTMENT Provider Note   CSN: [id number] Arrival date & time: 03/20/17  1245     History   Chief Complaint Chief Complaint  Patient presents with  . Chest Pain    HPI Sherri [name] is a 32 y.o. female.  HPI Sherri [name] is a 32 y.o. female with history of fibromyalgia, hypertension, presents to emergency department complaining of chest pain.  Patient states that her chest pain started approximately 1 week ago.  It is constant, retrosternal.  It is worse with deep breathing, movement, palpation of the chest.  She denies any injury or heavy lifting.  No history of similar pain in the past.  She has tried taking ibuprofen which has not helped.  She went to her primary care doctor today and was sent here for abnormal EKG.  Patient denies any recent travel or surgeries.  She denies any swelling or pain in her extremities.  Denies being pregnant.  She is not on any oral contraceptives at this time.  Past Medical History:  Diagnosis Date  . Fibromyalgia    denies today  . Genital herpes   . Hypertension   . 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 Urinary tract infection   . Uterine fibroid     Patient Active Problem List   Diagnosis Date Noted  . Hypertension   . Genital herpes   . Yeast infection 12/03/2016  . Status post cesarean delivery 06/06/2014  . Uterine fibroid 12/25/2013  . Obesity 12/25/2013    Past Surgical History:  Procedure Laterality Date  . CESAREAN SECTION N/A 06/05/2014   Procedure: CESAREAN SECTION;  Surgeon: Truett Mainland, DO;  Location: [facility];  Service: Obstetrics;  Laterality: N/A;  . WISDOM TOOTH EXTRACTION      OB History    Gravida Para Term Preterm AB Living   1 1   1   2    SAB TAB Ectopic Multiple Live Births         1 2       Home Medications    Prior to Admission medications   Medication Sig Start Date End Date Taking? Authorizing Provider  benzonatate  (TESSALON) 100 MG capsule Take 1 capsule (100 mg total) by mouth every 8 (eight) hours. 02/26/17   Domenic Moras, PA-C  ondansetron (ZOFRAN ODT) 4 MG disintegrating tablet Take 1 tablet (4 mg total) by mouth every 8 (eight) hours as needed for nausea or vomiting. 02/02/17   Fransico Meadow, PA-C  promethazine-dextromethorphan (PROMETHAZINE-DM) 6.25-15 MG/5ML syrup Take 5 mLs by mouth 4 (four) times daily as needed for cough. 02/26/17   Domenic Moras, PA-C    Family History Family History  Problem Relation Age of Onset  . Hypertension Mother   . Hypertension Father   . Breast cancer Maternal Grandmother        33  . Colon cancer Neg Hx   . Colon polyps Neg Hx   . Kidney disease Neg Hx   . Diabetes Neg Hx   . Esophageal cancer Neg Hx   . Gallbladder disease Neg Hx   . Heart disease Neg Hx   . Asthma Neg Hx   . Cancer Neg Hx   . Stroke Neg Hx     Social History Social History   Tobacco Use  . Smoking status: Never Smoker  . Smokeless tobacco: Never Used  Substance Use Topics  . Alcohol use: No    Alcohol/week: 0.0  oz    Comment: occasional when not pregnant  . Drug use: No     Allergies   Patient has no known allergies.   Review of Systems Review of Systems  Constitutional: Negative for chills and fever.  Respiratory: Positive for chest tightness. Negative for cough and shortness of breath.   Cardiovascular: Positive for chest pain. Negative for palpitations and leg swelling.  Gastrointestinal: Negative for abdominal pain, diarrhea, nausea and vomiting.  Musculoskeletal: Negative for arthralgias, myalgias, neck pain and neck stiffness.  Skin: Negative for rash.  Neurological: Negative for dizziness, weakness and headaches.  All other systems reviewed and are negative.    Physical Exam Updated Vital Signs BP 138/80 (BP Location: Right Arm)   Pulse 80   Temp 98.5 F (36.9 C) (Oral)   Resp 18   LMP 03/06/2017   SpO2 100%   Physical Exam  Constitutional: She  appears well-developed and well-nourished. No distress.  HENT:  Head: Normocephalic.  Eyes: Conjunctivae are normal.  Neck: Neck supple.  Cardiovascular: Normal rate, regular rhythm and normal heart sounds.  Pulmonary/Chest: Effort normal and breath sounds normal. No respiratory distress. She has no wheezes. She has no rales. She exhibits tenderness.  Diffuse chest wall tenderness, worse over the sternum   Abdominal: Soft. Bowel sounds are normal. She exhibits no distension. There is no tenderness. There is no rebound.  Musculoskeletal: She exhibits no edema.  Neurological: She is alert.  Skin: Skin is warm and dry.  Psychiatric: She has a normal mood and affect. Her behavior is normal.  Nursing note and vitals reviewed.    ED Treatments / Results  Labs (all labs ordered are listed, but only abnormal results are displayed) Labs Reviewed  BASIC METABOLIC PANEL  CBC  D-DIMER, QUANTITATIVE (NOT AT [facility])  I-STAT TROPONIN, ED  I-STAT BETA HCG BLOOD, ED ([facility], [facility], [facility] ONLY)  I-STAT TROPONIN, ED    EKG  EKG Interpretation  Date/Time:  Saturday March 20 2017 13:46:06 EST Ventricular Rate:  70 PR Interval:  142 QRS Duration: 82 QT Interval:  392 QTC Calculation: 423 R Axis:   35 Text Interpretation:  Normal sinus rhythm Normal ECG Confirmed by Nat Christen [phone]) on 03/20/2017 5:39:11 PM       Radiology Dg Chest 2 View  Result Date: 03/20/2017 CLINICAL DATA:  Chest pain with fatigue and dizziness EXAM: CHEST  2 VIEW COMPARISON:  September 03, 2016 FINDINGS: There is no edema or consolidation. The heart size and pulmonary vascularity are normal. No adenopathy. No evident bone lesions. No pneumothorax. IMPRESSION: No edema or consolidation. Electronically Signed   By: Lowella Grip III M.D.   On: 03/20/2017 14:35    Procedures Procedures (including critical care time)  Medications Ordered in ED Medications - No data to display   Initial Impression / Assessment and Plan / ED  Course  I have reviewed the triage vital signs and the nursing notes.  Pertinent labs & imaging results that were available during my care of the patient were reviewed by me and considered in my medical decision making (see chart for details).     Patient in emergency department with chest wall pain, also pain with deep breathing and coughing.  She denies any URI symptoms.  No swelling in extremities.  No risk factors for coronary artery disease other than hypertension, heart score of 1.  EKG here is unremarkable.  She is not pregnant.  Chest x-ray negative.  Will add a d-dimer given pleuritic pain and  repeat troponin. No ECG signs of pericarditis although considered. Normal VS.   6:59 PM Patient's blood work including second troponin and d-dimer are negative.  Patient's chest pain is clearly reproduced with palpation over the sternum, I believe that she may have costochondritis or musculoskeletal pain.  We will start her on NSAIDs.  We will have her follow-up with family doctor as needed.  At time of discharge, vital signs are all within normal, blood work unremarkable, chest x-ray is negative, she stable for discharge.  Vitals:   03/20/17 1342 03/20/17 1344 03/20/17 1637  BP:  (!) 149/80 138/80  Pulse: 76  80  Resp: 16  18  Temp: 98.5 F (36.9 C)    TempSrc: Oral    SpO2: 100%  100%      Final Clinical Impressions(s) / ED Diagnoses   Final diagnoses:  Chest wall pain    ED Discharge Orders        Ordered    meloxicam (MOBIC) 15 MG tablet  Daily     03/20/17 1855       Jeannett Senior, PA-C 03/20/17 1900    Nat Christen, MD 03/24/17 1011

## 2017-03-20 NOTE — ED Notes (Signed)
ED Provider at bedside. 

## 2017-03-20 NOTE — ED Notes (Signed)
No answer from pt in waiting room 

## 2017-03-20 NOTE — ED Triage Notes (Signed)
To ED for further eval of cp. Seen at [facility] for same and sent to ED for further workup. Pt states this pain started a week ago. She has been taking ibuprofen without relief. States pain feels like someone is punching her in the chest. Difficult to sleep at night due to pain. Walking makes pain worse. No sob.

## 2017-03-31 ENCOUNTER — Emergency Department (HOSPITAL_COMMUNITY)
Admission: EM | Admit: 2017-03-31 | Discharge: 2017-03-31 | Discharge status: Against Medical Advice | Payer: PRIVATE HEALTH INSURANCE

## 2017-04-01 ENCOUNTER — Ambulatory Visit (HOSPITAL_COMMUNITY)
Admission: EM | Admit: 2017-04-01 | Discharge: 2017-04-01 | Disposition: A | Discharge status: Home / Self Care | Payer: PRIVATE HEALTH INSURANCE | Attending: Family Medicine | Admitting: Family Medicine

## 2017-04-01 ENCOUNTER — Encounter (HOSPITAL_COMMUNITY): Payer: Self-pay | Admitting: Emergency Medicine

## 2017-04-01 DIAGNOSIS — R300 Dysuria: Secondary | ICD-10-CM | POA: Insufficient documentation

## 2017-04-01 DIAGNOSIS — N898 Other specified noninflammatory disorders of vagina: Secondary | ICD-10-CM | POA: Insufficient documentation

## 2017-04-01 DIAGNOSIS — M797 Fibromyalgia: Secondary | ICD-10-CM | POA: Diagnosis not present

## 2017-04-01 DIAGNOSIS — Z3202 Encounter for pregnancy test, result negative: Secondary | ICD-10-CM | POA: Diagnosis not present

## 2017-04-01 DIAGNOSIS — I10 Essential (primary) hypertension: Secondary | ICD-10-CM | POA: Insufficient documentation

## 2017-04-01 DIAGNOSIS — R102 Pelvic and perineal pain: Secondary | ICD-10-CM | POA: Insufficient documentation

## 2017-04-01 LAB — POCT URINALYSIS DIP (DEVICE)
Bilirubin Urine: NEGATIVE
Glucose, UA: NEGATIVE mg/dL
Leukocytes, UA: NEGATIVE
Nitrite: NEGATIVE
Protein, ur: 30 mg/dL — AB
Specific Gravity, Urine: >=1.03 (ref 1.005–1.030)
Urobilinogen, UA: 0.2 mg/dL (ref 0.0–1.0)
pH: 6 (ref 5.0–8.0)

## 2017-04-01 LAB — POCT PREGNANCY, URINE: Preg Test, Ur: NEGATIVE

## 2017-04-01 MED ORDER — AZITHROMYCIN 250 MG PO TABS
1000.0000 mg | ORAL_TABLET | Freq: Once | ORAL | Status: AC
  Administered 2017-04-01: 1000 mg via ORAL

## 2017-04-01 MED ORDER — CEFTRIAXONE SODIUM 1 G IJ SOLR
1.0000 g | Freq: Once | INTRAMUSCULAR | Status: AC
  Administered 2017-04-01: 1 g via INTRAMUSCULAR

## 2017-04-01 MED ORDER — AZITHROMYCIN 250 MG PO TABS
ORAL_TABLET | ORAL | Status: AC
  Filled 2017-04-01: qty 4

## 2017-04-01 MED ORDER — CEFTRIAXONE SODIUM 250 MG IJ SOLR
INTRAMUSCULAR | Status: AC
  Filled 2017-04-01: qty 250

## 2017-04-01 MED ORDER — NAPROXEN 500 MG PO TABS: 500.0000 mg | ORAL_TABLET | Freq: Two times a day (BID) | ORAL | 0 refills | Status: DC

## 2017-04-01 NOTE — Discharge Instructions (Signed)
Several tests are still pending but it appears that you have a mild infection in the uterus and we are giving you antibiotics here to treat that.  I prescribed some Naprosyn for you for the pain and expect that this whole problem will resolve over the next 48 hours.  If your pain worsens, please go to the emergency room where they can do an ultrasound.

## 2017-04-01 NOTE — ED Triage Notes (Signed)
PT reports abdominal pain, vaginal pain, and dysuria for 3-4 days.

## 2017-04-01 NOTE — ED Provider Notes (Signed)
Sherri   [name] 04/01/17 Arrival Time: 5176   SUBJECTIVE:  Sherri [name] is a 32 y.o. female who presents to the urgent care with complaint of vaginal discomfort and dysuria.  She recently started her period which was about 2 weeks early.  She is currently sexually active.     Past Medical History:  Diagnosis Date  . Fibromyalgia    denies today  . Genital herpes   . Hypertension   . 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 Urinary tract infection   . Uterine fibroid    Family History  Problem Relation Age of Onset  . Hypertension Mother   . Hypertension Father   . Breast cancer Maternal Grandmother        53  . Colon cancer Neg Hx   . Colon polyps Neg Hx   . Kidney disease Neg Hx   . Diabetes Neg Hx   . Esophageal cancer Neg Hx   . Gallbladder disease Neg Hx   . Heart disease Neg Hx   . Asthma Neg Hx   . Cancer Neg Hx   . Stroke Neg Hx    Social History   Socioeconomic History  . Marital status: Single    Spouse name: Not on file  . Number of children: Not on file  . Years of education: Not on file  . Highest education level: Not on file  Social Needs  . Financial resource strain: Not on file  . Food insecurity - worry: Not on file  . Food insecurity - inability: Not on file  . Transportation needs - medical: Not on file  . Transportation needs - non-medical: Not on file  Occupational History  . Occupation: [employer]  Tobacco Use  . Smoking status: Never Smoker  . Smokeless tobacco: Never Used  Substance and Sexual Activity  . Alcohol use: No    Alcohol/week: 0.0 oz    Comment: occasional when not pregnant  . Drug use: No  . Sexual activity: Yes    Birth control/protection: Condom  Other Topics Concern  . Not on file  Social History Narrative  . Not on file   No outpatient medications have been marked as taking for the 04/01/17 encounter [facility] Encounter).   No Known Allergies    ROS: As per  HPI, remainder of ROS negative.   OBJECTIVE:   Vitals:   04/01/17 1315  BP: 129/72  Pulse: 67  Resp: 16  Temp: 98.3 F (36.8 C)  TempSrc: Oral  SpO2: 100%  Weight: 170 lb (77.1 kg)  Height: 5' (1.524 m)     General appearance: alert; no distress Eyes: PERRL; EOMI; conjunctiva normal HENT: normocephalic; atraumatic; TMs normal, canal normal, external ears normal without trauma; nasal mucosa normal; oral mucosa normal Neck: supple Lungs: clear to auscultation bilaterally Heart: regular rate and rhythm Abdomen: soft, tender both sides of abdomen with deep palpation; bowel sounds normal; no masses or organomegaly; no guarding or rebound tenderness Pelvic:  Some menstrual blood in vault, no lesions or discharge, blue cervix which is tender with movement, normal sized UTX and adnexae. Back: no CVA tenderness Extremities: no cyanosis or edema; symmetrical with no gross deformities Skin: warm and dry Neurologic: normal gait; grossly normal Psychological: alert and cooperative; normal mood and affect   Labs:  Results for orders placed or performed during the hospital encounter of 04/01/17  POCT urinalysis dip (device)  Result Value Ref Range   Glucose, UA NEGATIVE NEGATIVE  mg/dL   Bilirubin Urine NEGATIVE NEGATIVE   Ketones, ur TRACE (A) NEGATIVE mg/dL   Specific Gravity, Urine >=1.030 1.005 - 1.030   Hgb urine dipstick LARGE (A) NEGATIVE   pH 6.0 5.0 - 8.0   Protein, ur 30 (A) NEGATIVE mg/dL   Urobilinogen, UA 0.2 0.0 - 1.0 mg/dL   Nitrite NEGATIVE NEGATIVE   Leukocytes, UA NEGATIVE NEGATIVE  Pregnancy, urine POC  Result Value Ref Range   Preg Test, Ur NEGATIVE NEGATIVE    Labs Reviewed  POCT URINALYSIS DIP (DEVICE) - Abnormal; Notable for the following components:      Result Value   Ketones, ur TRACE (*)    Hgb urine dipstick LARGE (*)    Protein, ur 30 (*)    All other components within normal limits  POCT PREGNANCY, URINE  URINE CYTOLOGY ANCILLARY ONLY     No results found.     ASSESSMENT & PLAN:  1. Pelvic pain     Meds ordered this encounter  Medications  . azithromycin (ZITHROMAX) tablet 1,000 mg  . cefTRIAXone (ROCEPHIN) injection 1 g  . naproxen (NAPROSYN) 500 MG tablet    Sig: Take 1 tablet (500 mg total) by mouth 2 (two) times daily.    Dispense:  30 tablet    Refill:  0    Reviewed expectations re: course of current medical issues. Questions answered. Outlined signs and symptoms indicating need for more acute intervention. Patient verbalized understanding. After Visit Summary given.      Robyn Haber, MD 04/01/17 1442

## 2017-04-02 LAB — URINE CYTOLOGY ANCILLARY ONLY
Chlamydia: NEGATIVE
Neisseria Gonorrhea: NEGATIVE
Trichomonas: NEGATIVE

## 2017-04-05 LAB — URINE CYTOLOGY ANCILLARY ONLY
Bacterial vaginitis: POSITIVE — AB
Candida vaginitis: NEGATIVE

## 2017-04-10 ENCOUNTER — Telehealth (HOSPITAL_COMMUNITY): Payer: Self-pay | Admitting: Emergency Medicine

## 2017-04-10 MED ORDER — METRONIDAZOLE 500 MG PO TABS: 500.0000 mg | ORAL_TABLET | Freq: Two times a day (BID) | ORAL | 0 refills | Status: DC

## 2017-04-10 NOTE — Telephone Encounter (Signed)
Called pt to notify of recent lab results.... Pt ID'd properly (Gc/Chlam, Trich, BV, Yeast) Reports persistent vag irritation Wants [place] to send Rx to [facility]... Rx sent Adv pt if sx are not getting better to return or to f/u w/PCP Education on safe sex given Notified pt that lab results can be obtained through MyChart Pt verb understanding.

## 2017-04-10 NOTE — Telephone Encounter (Signed)
-----   Message from Robyn Haber, MD sent at 04/04/2017 11:37 AM EST ----- Please inform patient of normal result

## 2017-04-13 ENCOUNTER — Encounter (HOSPITAL_COMMUNITY): Payer: Self-pay | Admitting: Family Medicine

## 2017-04-13 ENCOUNTER — Ambulatory Visit (HOSPITAL_COMMUNITY)
Admission: EM | Admit: 2017-04-13 | Discharge: 2017-04-13 | Disposition: A | Discharge status: Home / Self Care | Payer: PRIVATE HEALTH INSURANCE | Attending: Family Medicine | Admitting: Family Medicine

## 2017-04-13 DIAGNOSIS — R197 Diarrhea, unspecified: Secondary | ICD-10-CM | POA: Diagnosis not present

## 2017-04-13 DIAGNOSIS — R112 Nausea with vomiting, unspecified: Secondary | ICD-10-CM | POA: Diagnosis not present

## 2017-04-13 LAB — POCT I-STAT, CHEM 8
BUN: 5 mg/dL — ABNORMAL LOW (ref 6–20)
Calcium, Ion: 1.2 mmol/L (ref 1.15–1.40)
Chloride: 103 mmol/L (ref 101–111)
Creatinine, Ser: 0.7 mg/dL (ref 0.44–1.00)
Glucose, Bld: 94 mg/dL (ref 65–99)
HCT: 42 % (ref 36.0–46.0)
Hemoglobin: 14.3 g/dL (ref 12.0–15.0)
Potassium: 3.9 mmol/L (ref 3.5–5.1)
Sodium: 141 mmol/L (ref 135–145)
TCO2: 25 mmol/L (ref 22–32)

## 2017-04-13 MED ORDER — ONDANSETRON 4 MG PO TBDP: 4.0000 mg | ORAL_TABLET | Freq: Three times a day (TID) | ORAL | 0 refills | Status: DC | PRN

## 2017-04-13 NOTE — ED Provider Notes (Signed)
[facility]    CSN: [id number] Arrival date & time: 04/13/17  1050     History   Chief Complaint Chief Complaint  Patient presents with  . Abdominal Pain    HPI Sherri [name] is a 32 y.o. female   history of hypertension presenting today with nausea, vomiting, diarrhea and abdominal pain.  Symptoms have been going on for approximately 3 days.  She is concerned because she has noticed her stools to be very dark in color.  Denies any bright red blood. she states they range from watery to loose.  She is tolerating liquids but has not been able to tolerate solid foods.  Denies fever.  Abdominal pain is described as a stabbing pain on bilateral sides, 8 or 9 out of 10.  Worsens when she uses the restroom.  2 kids at home with similar symptoms.  Endorses mild lightheadedness.  HPI  Past Medical History:  Diagnosis Date  . Fibromyalgia    denies today  . Genital herpes   . Hypertension   . 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 Urinary tract infection   . Uterine fibroid     Patient Active Problem List   Diagnosis Date Noted  . Hypertension   . Genital herpes   . Yeast infection 12/03/2016  . Status post cesarean delivery 06/06/2014  . Uterine fibroid 12/25/2013  . Obesity 12/25/2013    Past Surgical History:  Procedure Laterality Date  . CESAREAN SECTION N/A 06/05/2014   Procedure: CESAREAN SECTION;  Surgeon: Truett Mainland, DO;  Location: [facility];  Service: Obstetrics;  Laterality: N/A;  . WISDOM TOOTH EXTRACTION      OB History    Gravida Para Term Preterm AB Living   1 1   1   2    SAB TAB Ectopic Multiple Live Births         1 2       Home Medications    Prior to Admission medications   Medication Sig Start Date End Date Taking? Authorizing Provider  metroNIDAZOLE (FLAGYL) 500 MG tablet Take 1 tablet (500 mg total) by mouth 2 (two) times daily. 04/10/17   Wynona Luna, MD  naproxen (NAPROSYN) 500 MG tablet Take 1  tablet (500 mg total) by mouth 2 (two) times daily. 04/01/17   Robyn Haber, MD  ondansetron (ZOFRAN ODT) 4 MG disintegrating tablet Take 1 tablet (4 mg total) by mouth every 8 (eight) hours as needed for nausea or vomiting. 04/13/17   Roger Fasnacht, Elesa Hacker, PA-C    Family History Family History  Problem Relation Age of Onset  . Hypertension Mother   . Hypertension Father   . Breast cancer Maternal Grandmother        42  . Colon cancer Neg Hx   . Colon polyps Neg Hx   . Kidney disease Neg Hx   . Diabetes Neg Hx   . Esophageal cancer Neg Hx   . Gallbladder disease Neg Hx   . Heart disease Neg Hx   . Asthma Neg Hx   . Cancer Neg Hx   . Stroke Neg Hx     Social History Social History   Tobacco Use  . Smoking status: Never Smoker  . Smokeless tobacco: Never Used  Substance Use Topics  . Alcohol use: No    Alcohol/week: 0.0 oz    Comment: occasional when not pregnant  . Drug use: No     Allergies   Patient  has no known allergies.   Review of Systems Review of Systems  Constitutional: Positive for appetite change. Negative for chills, fatigue and fever.  HENT: Positive for ear pain. Negative for congestion, rhinorrhea, sinus pressure, sore throat and trouble swallowing.   Respiratory: Negative for cough, chest tightness and shortness of breath.   Cardiovascular: Negative for chest pain.  Gastrointestinal: Positive for abdominal pain, diarrhea, nausea and vomiting. Negative for blood in stool.  Genitourinary: Negative for dysuria, pelvic pain and vaginal discharge.  Musculoskeletal: Negative for myalgias.  Skin: Negative for rash.  Neurological: Positive for light-headedness. Negative for dizziness and headaches.     Physical Exam Triage Vital Signs ED Triage Vitals  Enc Vitals Group     BP 04/13/17 1133 118/81     Pulse Rate 04/13/17 1133 82     Resp 04/13/17 1133 18     Temp 04/13/17 1133 98.2 F (36.8 C)     Temp Source 04/13/17 1133 Oral     SpO2 04/13/17  1133 100 %     Weight --      Height --      Head Circumference --      Peak Flow --      Pain Score 04/13/17 1132 9     Pain Loc --      Pain Edu? --      Excl. in [place]? --    No data found.  Updated Vital Signs BP 118/81   Pulse 82   Temp 98.2 F (36.8 C) (Oral)   Resp 18   LMP 03/30/2017   SpO2 100%   Visual Acuity Right Eye Distance:   Left Eye Distance:   Bilateral Distance:    Right Eye Near:   Left Eye Near:    Bilateral Near:     Physical Exam  Constitutional: She is oriented to person, place, and time. She appears well-developed and well-nourished. No distress.  HENT:  Head: Normocephalic and atraumatic.  Mouth/Throat: Oropharynx is clear and moist.  Eyes: Conjunctivae are normal.  Neck: Neck supple.  Cardiovascular: Normal rate and regular rhythm.  No murmur heard. Pulmonary/Chest: Effort normal and breath sounds normal. No respiratory distress.  Abdominal: Soft. There is no tenderness.  Abdomen soft, patient does not guard.  Bowel sounds present throughout all 4 quadrants.  Mild tenderness to palpation of right lower quadrant.  Negative McBurney's.  Negative Murphy's, no rebound, negative Rovsing's.  Patient also with some right lower flank pain.  Negative CVA tenderness.  Musculoskeletal: She exhibits no edema.  Neurological: She is alert and oriented to person, place, and time.  Skin: Skin is warm and dry.  Psychiatric: She has a normal mood and affect.  Nursing note and vitals reviewed.    UC Treatments / Results  Labs (all labs ordered are listed, but only abnormal results are displayed) Labs Reviewed - No data to display  EKG  EKG Interpretation None       Radiology No results found.  Procedures Procedures (including critical care time)  Medications Ordered in UC Medications - No data to display   Initial Impression / Assessment and Plan / UC Course  I have reviewed the triage vital signs and the nursing notes.  Pertinent labs &  imaging results that were available during my care of the patient were reviewed by me and considered in my medical decision making (see chart for details).     The patient likely experiencing viral gastroenteritis.  Patient with dark stools and lightheaded  this, will check i-STAT for hemoglobin and electrolytes.  Will send home with Zofran for nausea and vomiting, stressed importance of hydration and encouraging oral intake.  Advised she may try 1 Imodium to help slow things down so as it appears she is going to the bathroom multiple times an hour.  Advised that we do not want to stop diarrhea as this is the body's way of getting infectious source out. Discussed strict return precautions-symptoms of dehydration, acute abdomen, persistent symptoms. Patient verbalized understanding and is agreeable with plan.  I-STAT normal, will discharge patient with return precautions.   Final Clinical Impressions(s) / UC Diagnoses   Final diagnoses:  Nausea vomiting and diarrhea    ED Discharge Orders        Ordered    ondansetron (ZOFRAN ODT) 4 MG disintegrating tablet  Every 8 hours PRN     04/13/17 1206       Controlled Substance Prescriptions [place] Controlled Substance Registry consulted? Not Applicable   Janith Lima, [place] 04/13/17 1243

## 2017-04-13 NOTE — ED Triage Notes (Signed)
Pt here for abd pain, diarrhea and dark stools since 3 days ago. sts that her kids have been sick,.

## 2017-05-12 ENCOUNTER — Encounter (HOSPITAL_COMMUNITY): Payer: Self-pay | Admitting: Emergency Medicine

## 2017-05-12 ENCOUNTER — Emergency Department (HOSPITAL_COMMUNITY)
Admission: EM | Admit: 2017-05-12 | Discharge: 2017-05-12 | Disposition: A | Discharge status: Home / Self Care | Payer: PRIVATE HEALTH INSURANCE

## 2017-05-12 DIAGNOSIS — Z79899 Other long term (current) drug therapy: Secondary | ICD-10-CM | POA: Insufficient documentation

## 2017-05-12 DIAGNOSIS — I10 Essential (primary) hypertension: Secondary | ICD-10-CM | POA: Insufficient documentation

## 2017-05-12 DIAGNOSIS — R1084 Generalized abdominal pain: Secondary | ICD-10-CM | POA: Diagnosis not present

## 2017-05-12 DIAGNOSIS — R112 Nausea with vomiting, unspecified: Secondary | ICD-10-CM | POA: Diagnosis present

## 2017-05-12 LAB — COMPREHENSIVE METABOLIC PANEL
ALT: 14 U/L (ref 14–54)
AST: 20 U/L (ref 15–41)
Albumin: 4.3 g/dL (ref 3.5–5.0)
Alkaline Phosphatase: 76 U/L (ref 38–126)
Anion gap: 8 (ref 5–15)
BUN: 11 mg/dL (ref 6–20)
CO2: 24 mmol/L (ref 22–32)
Calcium: 9.7 mg/dL (ref 8.9–10.3)
Chloride: 107 mmol/L (ref 101–111)
Creatinine, Ser: 0.8 mg/dL (ref 0.44–1.00)
GFR calc Af Amer: >60 mL/min (ref >60)
GFR calc non Af Amer: >60 mL/min (ref >60)
Glucose, Bld: 129 mg/dL — ABNORMAL HIGH (ref 65–99)
Potassium: 3.8 mmol/L (ref 3.5–5.1)
Sodium: 139 mmol/L (ref 135–145)
Total Bilirubin: 0.3 mg/dL (ref 0.3–1.2)
Total Protein: 7.9 g/dL (ref 6.5–8.1)

## 2017-05-12 LAB — CBC
HCT: 39.3 % (ref 36.0–46.0)
Hemoglobin: 12.9 g/dL (ref 12.0–15.0)
MCH: 29.1 pg (ref 26.0–34.0)
MCHC: 32.8 g/dL (ref 30.0–36.0)
MCV: 88.7 fL (ref 78.0–100.0)
Platelets: 261 10*3/uL (ref 150–400)
RBC: 4.43 MIL/uL (ref 3.87–5.11)
RDW: 15.5 % (ref 11.5–15.5)
WBC: 7.7 10*3/uL (ref 4.0–10.5)

## 2017-05-12 LAB — I-STAT BETA HCG BLOOD, ED (MC, WL, AP ONLY): I-stat hCG, quantitative: <5 m[IU]/mL (ref <5)

## 2017-05-12 LAB — LIPASE, BLOOD: Lipase: 25 U/L (ref 11–51)

## 2017-05-12 NOTE — ED Notes (Signed)
Pt called x2 for triage.  No answer.  

## 2017-05-12 NOTE — ED Triage Notes (Signed)
Patient c/o dizziness, emesis, and fatigue after having a "weed brownie" at approximately 1700 today. Denies chest pain and SOB. Alert and oriented. Ambulatory.

## 2017-05-12 NOTE — ED Notes (Signed)
Last Call: Pt called x3 for triage. No answer from lobby.

## 2017-05-13 ENCOUNTER — Emergency Department (HOSPITAL_COMMUNITY)
Admission: EM | Admit: 2017-05-13 | Discharge: 2017-05-13 | Disposition: A | Discharge status: Home / Self Care | Payer: PRIVATE HEALTH INSURANCE | Attending: Emergency Medicine | Admitting: Emergency Medicine

## 2017-05-13 DIAGNOSIS — R112 Nausea with vomiting, unspecified: Secondary | ICD-10-CM

## 2017-05-13 LAB — URINALYSIS, ROUTINE W REFLEX MICROSCOPIC
Bilirubin Urine: NEGATIVE
Glucose, UA: NEGATIVE mg/dL
Hgb urine dipstick: NEGATIVE
Ketones, ur: NEGATIVE mg/dL
Leukocytes, UA: NEGATIVE
Nitrite: NEGATIVE
Protein, ur: NEGATIVE mg/dL
Specific Gravity, Urine: 1.014 (ref 1.005–1.030)
pH: 6 (ref 5.0–8.0)

## 2017-05-13 MED ORDER — SODIUM CHLORIDE 0.9 % IV BOLUS (SEPSIS): 1000.0000 mL | Freq: Once | INTRAVENOUS | Status: DC

## 2017-05-13 MED ORDER — ONDANSETRON 4 MG PO TBDP: 4.0000 mg | ORAL_TABLET | Freq: Three times a day (TID) | ORAL | 0 refills | Status: DC | PRN

## 2017-05-13 MED ORDER — ONDANSETRON HCL 4 MG/2ML IJ SOLN: 4.0000 mg | Freq: Once | INTRAMUSCULAR | Status: DC

## 2017-05-13 MED ORDER — ONDANSETRON 4 MG PO TBDP
4.0000 mg | ORAL_TABLET | Freq: Once | ORAL | Status: AC
  Administered 2017-05-13: 4 mg via ORAL
  Filled 2017-05-13: qty 1

## 2017-05-13 NOTE — ED Provider Notes (Signed)
[facility] DEPT Provider Note   CSN: [id number] Arrival date & time: 05/12/17  2048     History   Chief Complaint Chief Complaint  Patient presents with  . Emesis    HPI Sherri [name] is a 32 y.o. female.  Patient with history of cesarean section, no other abdominal surgeries, presents with complaint of nausea and vomiting.  Patient began having vomiting about 30 minutes after eating brownies containing marijuana.  Patient had some mild generalized abdominal pain.  She vomited multiple times and complains of a reflux feeling in her chest.  No fevers, diarrhea, or constipation.  No chest pain or shortness of breath.  No treatments prior to arrival.  Symptoms gradually improving but she continues to have nausea.  She denies ever having brownies before.  Nobody else had similar symptoms. The onset of this condition was acute. Aggravating factors: none. Alleviating factors: none.        Past Medical History:  Diagnosis Date  . Fibromyalgia    denies today  . Genital herpes   . Hypertension   . 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 Urinary tract infection   . Uterine fibroid     Patient Active Problem List   Diagnosis Date Noted  . Hypertension   . Genital herpes   . Yeast infection 12/03/2016  . Status post cesarean delivery 06/06/2014  . Uterine fibroid 12/25/2013  . Obesity 12/25/2013    Past Surgical History:  Procedure Laterality Date  . CESAREAN SECTION N/A 06/05/2014   Procedure: CESAREAN SECTION;  Surgeon: Truett Mainland, DO;  Location: [facility];  Service: Obstetrics;  Laterality: N/A;  . WISDOM TOOTH EXTRACTION      OB History    Gravida Para Term Preterm AB Living   1 1   1   2    SAB TAB Ectopic Multiple Live Births         1 2       Home Medications    Prior to Admission medications   Medication Sig Start Date End Date Taking? Authorizing Provider  metroNIDAZOLE (FLAGYL) 500 MG tablet Take  1 tablet (500 mg total) by mouth 2 (two) times daily. 04/10/17   Wynona Luna, MD  naproxen (NAPROSYN) 500 MG tablet Take 1 tablet (500 mg total) by mouth 2 (two) times daily. 04/01/17   Robyn Haber, MD  ondansetron (ZOFRAN ODT) 4 MG disintegrating tablet Take 1 tablet (4 mg total) by mouth every 8 (eight) hours as needed for nausea or vomiting. 04/13/17   Wieters, Elesa Hacker, PA-C    Family History Family History  Problem Relation Age of Onset  . Hypertension Mother   . Hypertension Father   . Breast cancer Maternal Grandmother        51  . Colon cancer Neg Hx   . Colon polyps Neg Hx   . Kidney disease Neg Hx   . Diabetes Neg Hx   . Esophageal cancer Neg Hx   . Gallbladder disease Neg Hx   . Heart disease Neg Hx   . Asthma Neg Hx   . Cancer Neg Hx   . Stroke Neg Hx     Social History Social History   Tobacco Use  . Smoking status: Never Smoker  . Smokeless tobacco: Never Used  Substance Use Topics  . Alcohol use: No    Alcohol/week: 0.0 oz    Comment: occasional when not pregnant  . Drug use: No  Allergies   Patient has no known allergies.   Review of Systems Review of Systems  Constitutional: Negative for fever.  HENT: Negative for rhinorrhea and sore throat.   Eyes: Negative for redness.  Respiratory: Negative for cough and shortness of breath.   Cardiovascular: Negative for chest pain.  Gastrointestinal: Positive for abdominal pain, nausea and vomiting. Negative for diarrhea.  Genitourinary: Negative for dysuria.  Musculoskeletal: Negative for myalgias.  Skin: Negative for rash.  Neurological: Negative for headaches.     Physical Exam Updated Vital Signs BP (!) 143/96 (BP Location: Right Arm)   Pulse (!) 116   Temp 98.4 F (36.9 C) (Oral)   Resp 16   LMP 04/28/2017   SpO2 98%   Physical Exam  Constitutional: She appears well-developed and well-nourished.  HENT:  Head: Normocephalic and atraumatic.  Mouth/Throat: Oropharynx is clear  and moist.  Eyes: Conjunctivae are normal. Right eye exhibits no discharge. Left eye exhibits no discharge.  Neck: Normal range of motion. Neck supple.  Cardiovascular: Normal rate, regular rhythm and normal heart sounds.  Pulmonary/Chest: Effort normal and breath sounds normal. No respiratory distress.  Abdominal: Soft. There is no tenderness. There is no rebound and no guarding.  Neurological: She is alert.  Skin: Skin is warm and dry.  Psychiatric: She has a normal mood and affect.  Nursing note and vitals reviewed.    ED Treatments / Results  Labs (all labs ordered are listed, but only abnormal results are displayed) Labs Reviewed  COMPREHENSIVE METABOLIC PANEL - Abnormal; Notable for the following components:      Result Value   Glucose, Bld 129 (*)    All other components within normal limits  URINALYSIS, ROUTINE W REFLEX MICROSCOPIC - Abnormal; Notable for the following components:   APPearance HAZY (*)    Bacteria, UA RARE (*)    Squamous Epithelial / LPF 0-5 (*)    All other components within normal limits  LIPASE, BLOOD  CBC  I-STAT BETA HCG BLOOD, ED ([facility], [facility], [facility] ONLY)    EKG  EKG Interpretation None       Radiology No results found.  Procedures Procedures (including critical care time)  Medications Ordered in ED Medications  ondansetron (ZOFRAN-ODT) disintegrating tablet 4 mg (4 mg Oral Given 05/13/17 0342)     Initial Impression / Assessment and Plan / ED Course  I have reviewed the triage vital signs and the nursing notes.  Pertinent labs & imaging results that were available during my care of the patient were reviewed by me and considered in my medical decision making (see chart for details).     Patient seen and examined. Work-up reviewed. Medications ordered. HR improved at time of exam. Will attempt PO challenge. Anticipate d/c to home. Abd soft and non-tender. Patient in waiting room > 5 hrs, improving.   Vital signs reviewed and are as  follows: BP (!) 143/96 (BP Location: Right Arm)   Pulse (!) 116   Temp 98.4 F (36.9 C) (Oral)   Resp 16   LMP 04/28/2017   SpO2 98%   4:03 AM patient given ODT Zofran and then ginger ale to drink which she has done without vomiting.  She is comfortable discharged home at this time.  Discussed brat diet, signs and symptoms to return.  The patient was urged to return to the Emergency Department immediately with worsening of current symptoms, worsening abdominal pain, persistent vomiting, blood noted in stools, fever, or any other concerns. The patient verbalized understanding.  Final Clinical Impressions(s) / ED Diagnoses   Final diagnoses:  Non-intractable vomiting with nausea, unspecified vomiting type   Patient with nausea and vomiting after ingestion of marijuana.  She is improving.  Labs are reassuring.  She is now tolerating p.o.'s.  No focal abdominal pain on exam.  Feel comfortable with discharged home at this time.  Vital signs are reassuring.  ED Discharge Orders        Ordered    ondansetron (ZOFRAN ODT) 4 MG disintegrating tablet  Every 8 hours PRN     05/13/17 0401       Carlisle Cater, PA-C 05/13/17 0404    Duffy Bruce, MD 05/13/17 [phone]

## 2017-05-13 NOTE — Discharge Instructions (Signed)
Please read and follow all provided instructions.  Your diagnoses today include:  1. Non-intractable vomiting with nausea, unspecified vomiting type    Tests performed today include:  Blood counts and electrolytes  Kidney and liver function  Urine test  Vital signs. See below for your results today.   Medications prescribed:   Zofran (ondansetron) - for nausea and vomiting  Take any prescribed medications only as directed.  Home care instructions:  Follow any educational materials contained in this packet.  Follow-up instructions: Please follow-up with your primary care provider as needed.    Return instructions:   Please return to the Emergency Department if you experience worsening symptoms.   Return with worsening abdominal pain, fever, uncontrolled vomiting.  Please return if you have any other emergent concerns.  Additional Information:  Your vital signs today were: BP 130/74 (BP Location: Right Arm)    Pulse 70    Temp 98.4 F (36.9 C) (Oral)    Resp 15    LMP 04/28/2017    SpO2 98%  If your blood pressure (BP) was elevated above 135/85 this visit, please have this repeated by your doctor within one month. --------------

## 2017-05-27 ENCOUNTER — Encounter: Payer: Self-pay | Admitting: Podiatry

## 2017-05-27 ENCOUNTER — Ambulatory Visit (INDEPENDENT_AMBULATORY_CARE_PROVIDER_SITE_OTHER): Payer: PRIVATE HEALTH INSURANCE

## 2017-05-27 ENCOUNTER — Telehealth: Payer: Self-pay | Admitting: *Deleted

## 2017-05-27 ENCOUNTER — Ambulatory Visit (INDEPENDENT_AMBULATORY_CARE_PROVIDER_SITE_OTHER): Payer: PRIVATE HEALTH INSURANCE | Admitting: Podiatry

## 2017-05-27 VITALS — BP 121/77 | HR 74

## 2017-05-27 DIAGNOSIS — M722 Plantar fascial fibromatosis: Secondary | ICD-10-CM

## 2017-05-27 MED ORDER — TRIAMCINOLONE ACETONIDE 10 MG/ML IJ SUSP
10.0000 mg | Freq: Once | INTRAMUSCULAR | Status: AC
Start: 2017-05-27 — End: 2017-05-27
  Administered 2017-05-27: 10 mg

## 2017-05-27 MED ORDER — DICLOFENAC SODIUM 75 MG PO TBEC: 75.0000 mg | DELAYED_RELEASE_TABLET | Freq: Two times a day (BID) | ORAL | 2 refills | Status: DC

## 2017-05-27 NOTE — Telephone Encounter (Signed)
Pt states she would like the diclofenac sent to the [facility] on [address]. Pharmacy changed.

## 2017-05-27 NOTE — Progress Notes (Signed)
Subjective:   Patient ID: Sherri [name], female   DOB: 32 y.o.   MRN: [id number]   HPI Patient presents with intense heel pain of several days' duration bilateral and does not remember specific injury.  States this happened to her a couple years ago and it lasted for around 6 months that time and she like to get it treated and better.  Patient currently does not smoke and likes to be active and   Review of Systems  All other systems reviewed and are negative.       Objective:  Physical Exam  Constitutional: She appears well-developed and well-nourished.  Cardiovascular: Intact distal pulses.  Pulmonary/Chest: Effort normal.  Musculoskeletal: Normal range of motion.  Neurological: She is alert.  Skin: Skin is warm.  Nursing note and vitals reviewed.   Neurovascular status intact muscle strength is adequate range of motion within normal limits with patient found to have intense discomfort in the central band of the plantar fascial bilateral with a lot of pain upon palpation.  Patient has mild depression of the arch but was noted to have good digital perfusion well oriented x3     Assessment:  Acute plantar fasciitis bilateral heel     Plan:  H&P x-rays reviewed and today I injected the plantar fascial bilateral 3 mg Kenalog 5 mg Xylocaine and instructed on physical therapy placed on oral anti-inflammatory and gave instructions on supportive shoe gear usage.  Reappoint for recheck  X-rays indicate that there is a minimal spur formation and no indication of stress fracture of the calcaneus

## 2017-06-08 ENCOUNTER — Emergency Department (HOSPITAL_COMMUNITY)
Admission: EM | Admit: 2017-06-08 | Discharge: 2017-06-08 | Disposition: A | Discharge status: Home / Self Care | Payer: PRIVATE HEALTH INSURANCE | Attending: Emergency Medicine | Admitting: Emergency Medicine

## 2017-06-08 ENCOUNTER — Encounter (HOSPITAL_COMMUNITY): Payer: Self-pay | Admitting: Emergency Medicine

## 2017-06-08 DIAGNOSIS — Z79899 Other long term (current) drug therapy: Secondary | ICD-10-CM | POA: Insufficient documentation

## 2017-06-08 DIAGNOSIS — R300 Dysuria: Secondary | ICD-10-CM | POA: Insufficient documentation

## 2017-06-08 DIAGNOSIS — B9689 Other specified bacterial agents as the cause of diseases classified elsewhere: Secondary | ICD-10-CM | POA: Insufficient documentation

## 2017-06-08 DIAGNOSIS — N760 Acute vaginitis: Secondary | ICD-10-CM | POA: Insufficient documentation

## 2017-06-08 LAB — URINALYSIS, ROUTINE W REFLEX MICROSCOPIC
Bilirubin Urine: NEGATIVE
Glucose, UA: NEGATIVE mg/dL
Hgb urine dipstick: NEGATIVE
Ketones, ur: 5 mg/dL — AB
Leukocytes, UA: NEGATIVE
Nitrite: NEGATIVE
Protein, ur: NEGATIVE mg/dL
Specific Gravity, Urine: 1.019 (ref 1.005–1.030)
pH: 5 (ref 5.0–8.0)

## 2017-06-08 LAB — WET PREP, GENITAL
Sperm: NONE SEEN
Trich, Wet Prep: NONE SEEN
Yeast Wet Prep HPF POC: NONE SEEN

## 2017-06-08 LAB — POC URINE PREG, ED: Preg Test, Ur: NEGATIVE

## 2017-06-08 MED ORDER — METRONIDAZOLE 500 MG PO TABS: 500.0000 mg | ORAL_TABLET | Freq: Two times a day (BID) | ORAL | 0 refills | Status: DC

## 2017-06-08 NOTE — ED Provider Notes (Signed)
[facility] DEPT Provider Note   CSN: [id number] Arrival date & time: 06/08/17  1914     History   Chief Complaint Chief Complaint  Patient presents with  . Urinary Frequency    HPI Sherri [name] is a 32 y.o. female.  HPI  Pt was seen at 1010. Per pt, c/o gradual onset and persistence of constant dysuria for the past 4 days.  Denies flank pain, no fevers, no abd pain, no N/V/D, no vaginal bleeding/discharge, no rash.    Past Medical History:  Diagnosis Date  . Fibromyalgia    denies today  . Genital herpes   . Hypertension   . 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 Urinary tract infection   . Uterine fibroid     Patient Active Problem List   Diagnosis Date Noted  . Hypertension   . Genital herpes   . Yeast infection 12/03/2016  . Status post cesarean delivery 06/06/2014  . Uterine fibroid 12/25/2013  . Obesity 12/25/2013    Past Surgical History:  Procedure Laterality Date  . CESAREAN SECTION N/A 06/05/2014   Procedure: CESAREAN SECTION;  Surgeon: Truett Mainland, DO;  Location: [facility];  Service: Obstetrics;  Laterality: N/A;  . WISDOM TOOTH EXTRACTION       OB History    Gravida  1   Para  1   Term      Preterm  1   AB      Living  2     SAB      TAB      Ectopic      Multiple  1   Live Births  2            Home Medications    Prior to Admission medications   Medication Sig Start Date End Date Taking? Authorizing Provider  diclofenac (VOLTAREN) 75 MG EC tablet Take 1 tablet (75 mg total) by mouth 2 (two) times daily. 05/27/17  Yes Regal, Tamala Fothergill, DPM  tretinoin (RETIN-A) 0.025 % cream Apply 1 application topically at bedtime.   Yes [provider]  triamcinolone cream (KENALOG) 0.1 % Apply 1 application topically 2 (two) times daily.   Yes [provider]    Family History Family History  Problem Relation Age of Onset  . Hypertension Mother   .  Hypertension Father   . Breast cancer Maternal Grandmother        69  . Colon cancer Neg Hx   . Colon polyps Neg Hx   . Kidney disease Neg Hx   . Diabetes Neg Hx   . Esophageal cancer Neg Hx   . Gallbladder disease Neg Hx   . Heart disease Neg Hx   . Asthma Neg Hx   . Cancer Neg Hx   . Stroke Neg Hx     Social History Social History   Tobacco Use  . Smoking status: Never Smoker  . Smokeless tobacco: Never Used  Substance Use Topics  . Alcohol use: No    Alcohol/week: 0.0 oz    Comment: occasional when not pregnant  . Drug use: No     Allergies   Patient has no known allergies.   Review of Systems Review of Systems ROS: Statement: All systems negative except as marked or noted in the HPI; Constitutional: Negative for fever and chills. ; ; Eyes: Negative for eye pain, redness and discharge. ; ; ENMT: Negative for ear pain, hoarseness, nasal congestion,  sinus pressure and sore throat. ; ; Cardiovascular: Negative for chest pain, palpitations, diaphoresis, dyspnea and peripheral edema. ; ; Respiratory: Negative for cough, wheezing and stridor. ; ; Gastrointestinal: Negative for nausea, vomiting, diarrhea, abdominal pain, blood in stool, hematemesis, jaundice and rectal bleeding. . ; ; Genitourinary: +dysuria. Negative for flank pain and hematuria. ; ; GYN:  No pelvic pain, no vaginal bleeding, no vaginal discharge, no vulvar pain. ;; Musculoskeletal: Negative for back pain and neck pain. Negative for swelling and trauma.; ; Skin: Negative for pruritus, rash, abrasions, blisters, bruising and skin lesion.; ; Neuro: Negative for headache, lightheadedness and neck stiffness. Negative for weakness, altered level of consciousness, altered mental status, extremity weakness, paresthesias, involuntary movement, seizure and syncope.       Physical Exam Updated Vital Signs BP (!) 146/84 (BP Location: Right Arm)   Pulse 89   Temp 98.4 F (36.9 C) (Oral)   Resp 18   Ht 4\' 11"  (1.499 m)    LMP 05/22/2017   SpO2 98%   BMI 34.34 kg/m   Physical Exam 1015: Physical examination:  Nursing notes reviewed; Vital signs and O2 SAT reviewed;  Constitutional: Well developed, Well nourished, Well hydrated, In no acute distress; Head:  Normocephalic, atraumatic; Eyes: EOMI, PERRL, No scleral icterus; ENMT: Mouth and pharynx normal, Mucous membranes moist; Neck: Supple, Full range of motion, No lymphadenopathy; Cardiovascular: Regular rate and rhythm, No gallop; Respiratory: Breath sounds clear & equal bilaterally, No wheezes.  Speaking full sentences with ease, Normal respiratory effort/excursion; Chest: Nontender, Movement normal; Abdomen: Soft, Nontender, Nondistended, Normal bowel sounds; Genitourinary: No CVA tenderness. Pelvic exam performed with permission of pt and female ED RN assist during exam.  External genitalia w/o lesions. Vaginal vault with white discharge.  Cervix w/o lesions, not friable, GC/chlam and wet prep obtained and sent to lab.  Bimanual exam w/o CMT, uterine or adnexal tenderness.;; Extremities: Peripheral pulses normal, No tenderness, No edema, No calf edema or asymmetry.; Neuro: AA&Ox3, Major CN grossly intact.  Speech clear. No gross focal motor or sensory deficits in extremities.; Skin: Color normal, Warm, Dry.   ED Treatments / Results  Labs (all labs ordered are listed, but only abnormal results are displayed)   EKG None  Radiology   Procedures Procedures (including critical care time)  Medications Ordered in ED Medications - No data to display   Initial Impression / Assessment and Plan / ED Course  I have reviewed the triage vital signs and the nursing notes.  Pertinent labs & imaging results that were available during my care of the patient were reviewed by me and considered in my medical decision making (see chart for details).  MDM Reviewed: previous chart, nursing note and vitals Reviewed previous: labs Interpretation: labs    Results  for orders placed or performed during the hospital encounter of 06/08/17  Wet prep, genital  Result Value Ref Range   Yeast Wet Prep HPF POC NONE SEEN NONE SEEN   Trich, Wet Prep NONE SEEN NONE SEEN   Clue Cells Wet Prep HPF POC PRESENT (A) NONE SEEN   WBC, Wet Prep HPF POC FEW (A) NONE SEEN   Sperm NONE SEEN   Urinalysis, Routine w reflex microscopic- may I&O cath if menses  Result Value Ref Range   Color, Urine YELLOW YELLOW   APPearance HAZY (A) CLEAR   Specific Gravity, Urine 1.019 1.005 - 1.030   pH 5.0 5.0 - 8.0   Glucose, UA NEGATIVE NEGATIVE mg/dL   Hgb urine dipstick  NEGATIVE NEGATIVE   Bilirubin Urine NEGATIVE NEGATIVE   Ketones, ur 5 (A) NEGATIVE mg/dL   Protein, ur NEGATIVE NEGATIVE mg/dL   Nitrite NEGATIVE NEGATIVE   Leukocytes, UA NEGATIVE NEGATIVE  POC urine preg, ED  Result Value Ref Range   Preg Test, Ur NEGATIVE NEGATIVE    1105:  Tx for BV, f/u GYN MD. Dx and testing d/w pt.  Questions answered.  Verb understanding, agreeable to d/c home with outpt f/u.   Final Clinical Impressions(s) / ED Diagnoses   Final diagnoses:  None    ED Discharge Orders    None       Francine Graven, DO 06/12/17 4403

## 2017-06-08 NOTE — ED Triage Notes (Signed)
Patient c/o urinary frequency with foul odor since Friday with nausea. Denies any burning with urination or discharge. Able to eat and drink with no issues.

## 2017-06-08 NOTE — Discharge Instructions (Addendum)
Take the prescription as directed.  Call the GYN doctor today to schedule a follow up appointment within the next week.  Return to the Emergency Department immediately sooner if worsening.

## 2017-06-09 LAB — GC/CHLAMYDIA PROBE AMP (~~LOC~~) NOT AT ARMC
Chlamydia: NEGATIVE
Neisseria Gonorrhea: NEGATIVE

## 2017-06-11 ENCOUNTER — Ambulatory Visit (HOSPITAL_COMMUNITY)
Admission: EM | Admit: 2017-06-11 | Discharge: 2017-06-11 | Disposition: A | Discharge status: Home / Self Care | Payer: PRIVATE HEALTH INSURANCE | Attending: Family Medicine | Admitting: Family Medicine

## 2017-06-11 ENCOUNTER — Encounter (HOSPITAL_COMMUNITY): Payer: Self-pay | Admitting: Family Medicine

## 2017-06-11 DIAGNOSIS — L559 Sunburn, unspecified: Secondary | ICD-10-CM

## 2017-06-11 NOTE — ED Triage Notes (Signed)
Pt here for sun burn to arm and neck. 1st degree

## 2017-06-11 NOTE — ED Provider Notes (Signed)
[facility]    CSN: [id number] Arrival date & time: 06/11/17  1445     History   Chief Complaint Chief Complaint  Patient presents with  . Sunburn    HPI Sherri [name] is a 32 y.o. female.   31 yo female here for sunburn x 1 days. Says it hurts when she showers. No blisters. Has never had sunburn before.     Past Medical History:  Diagnosis Date  . Fibromyalgia    denies today  . Genital herpes   . Hypertension   . 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 Urinary tract infection   . Uterine fibroid     Patient Active Problem List   Diagnosis Date Noted  . Hypertension   . Genital herpes   . Yeast infection 12/03/2016  . Status post cesarean delivery 06/06/2014  . Uterine fibroid 12/25/2013  . Obesity 12/25/2013    Past Surgical History:  Procedure Laterality Date  . CESAREAN SECTION N/A 06/05/2014   Procedure: CESAREAN SECTION;  Surgeon: Truett Mainland, DO;  Location: [facility];  Service: Obstetrics;  Laterality: N/A;  . WISDOM TOOTH EXTRACTION      OB History    Gravida  1   Para  1   Term      Preterm  1   AB      Living  2     SAB      TAB      Ectopic      Multiple  1   Live Births  2            Home Medications    Prior to Admission medications   Medication Sig Start Date End Date Taking? Authorizing Provider  diclofenac (VOLTAREN) 75 MG EC tablet Take 1 tablet (75 mg total) by mouth 2 (two) times daily. 05/27/17   Wallene Huh, DPM  metroNIDAZOLE (FLAGYL) 500 MG tablet Take 1 tablet (500 mg total) by mouth 2 (two) times daily. 06/08/17   Francine Graven, DO  tretinoin (RETIN-A) 0.025 % cream Apply 1 application topically at bedtime.    [provider]  triamcinolone cream (KENALOG) 0.1 % Apply 1 application topically 2 (two) times daily.    [provider]    Family History Family History  Problem Relation Age of Onset  . Hypertension Mother   . Hypertension Father    . Breast cancer Maternal Grandmother        28  . Colon cancer Neg Hx   . Colon polyps Neg Hx   . Kidney disease Neg Hx   . Diabetes Neg Hx   . Esophageal cancer Neg Hx   . Gallbladder disease Neg Hx   . Heart disease Neg Hx   . Asthma Neg Hx   . Cancer Neg Hx   . Stroke Neg Hx     Social History Social History   Tobacco Use  . Smoking status: Never Smoker  . Smokeless tobacco: Never Used  Substance Use Topics  . Alcohol use: No    Alcohol/week: 0.0 oz    Comment: occasional when not pregnant  . Drug use: No     Allergies   Patient has no known allergies.   Review of Systems Review of Systems  Constitutional: Negative for activity change and appetite change.  HENT: Negative for congestion and ear discharge.   Eyes: Negative for discharge and itching.  Respiratory: Negative for apnea and chest tightness.  Cardiovascular: Negative for chest pain and leg swelling.  Gastrointestinal: Negative for abdominal distention and abdominal pain.  Endocrine: Negative for cold intolerance and heat intolerance.  Genitourinary: Negative for difficulty urinating and dyspareunia.  Musculoskeletal: Negative for arthralgias and back pain.  Skin:       sunburn  Neurological: Negative for dizziness and headaches.  Hematological: Negative for adenopathy. Does not bruise/bleed easily.     Physical Exam Triage Vital Signs ED Triage Vitals  Enc Vitals Group     BP 06/11/17 1524 (!) 148/91     Pulse Rate 06/11/17 1524 72     Resp --      Temp 06/11/17 1524 98 F (36.7 C)     Temp Source 06/11/17 1524 Oral     SpO2 06/11/17 1524 100 %     Weight --      Height --      Head Circumference --      Peak Flow --      Pain Score 06/11/17 1531 5     Pain Loc --      Pain Edu? --      Excl. in [place]? --    No data found.  Updated Vital Signs BP (!) 148/91 (BP Location: Left Arm)   Pulse 72   Temp 98 F (36.7 C) (Oral)   LMP 05/22/2017   SpO2 100%   Visual Acuity Right Eye  Distance:   Left Eye Distance:   Bilateral Distance:    Right Eye Near:   Left Eye Near:    Bilateral Near:     Physical Exam  Constitutional: She is oriented to person, place, and time. She appears well-developed and well-nourished.  HENT:  Head: Normocephalic and atraumatic.  Eyes: Pupils are equal, round, and reactive to light. EOM are normal.  Neck: Normal range of motion. Neck supple.  Pulmonary/Chest: Effort normal. No respiratory distress.  Musculoskeletal: Normal range of motion. She exhibits no edema.  Neurological: She is alert and oriented to person, place, and time.  Skin: Skin is warm and dry.  Mild sunburn on bilateral arm and back of neck without blisters  Psychiatric: She has a normal mood and affect. Her behavior is normal.     UC Treatments / Results  Labs (all labs ordered are listed, but only abnormal results are displayed) Labs Reviewed - No data to display  EKG None Radiology No results found.  Procedures Procedures (including critical care time)  Medications Ordered in UC Medications - No data to display   Initial Impression / Assessment and Plan / UC Course  I have reviewed the triage vital signs and the nursing notes.  Pertinent labs & imaging results that were available during my care of the patient were reviewed by me and considered in my medical decision making (see chart for details).     1. Sunburn- advised aloe vera gel and daily sunscreen application  Final Clinical Impressions(s) / UC Diagnoses   Final diagnoses:  None    ED Discharge Orders    None       Controlled Substance Prescriptions [place] Controlled Substance Registry consulted? Not Applicable   Dannielle Huh, DO 06/11/17 1608

## 2017-06-14 ENCOUNTER — Encounter: Payer: Self-pay | Admitting: Podiatry

## 2017-06-14 ENCOUNTER — Ambulatory Visit (INDEPENDENT_AMBULATORY_CARE_PROVIDER_SITE_OTHER): Payer: PRIVATE HEALTH INSURANCE | Admitting: Podiatry

## 2017-06-14 DIAGNOSIS — M722 Plantar fascial fibromatosis: Secondary | ICD-10-CM

## 2017-06-14 MED ORDER — TRIAMCINOLONE ACETONIDE 10 MG/ML IJ SUSP
10.0000 mg | Freq: Once | INTRAMUSCULAR | Status: AC
  Administered 2017-06-14: 10 mg

## 2017-06-15 ENCOUNTER — Ambulatory Visit (INDEPENDENT_AMBULATORY_CARE_PROVIDER_SITE_OTHER): Payer: Self-pay | Admitting: General Practice

## 2017-06-15 DIAGNOSIS — Z113 Encounter for screening for infections with a predominantly sexual mode of transmission: Secondary | ICD-10-CM

## 2017-06-15 DIAGNOSIS — T3695XA Adverse effect of unspecified systemic antibiotic, initial encounter: Principal | ICD-10-CM

## 2017-06-15 DIAGNOSIS — B379 Candidiasis, unspecified: Secondary | ICD-10-CM

## 2017-06-15 LAB — POCT URINALYSIS DIP (DEVICE)
Bilirubin Urine: NEGATIVE
Glucose, UA: NEGATIVE mg/dL
Hgb urine dipstick: NEGATIVE
Ketones, ur: NEGATIVE mg/dL
Nitrite: NEGATIVE
Protein, ur: NEGATIVE mg/dL
Specific Gravity, Urine: 1.02 (ref 1.005–1.030)
Urobilinogen, UA: 0.2 mg/dL (ref 0.0–1.0)
pH: 6 (ref 5.0–8.0)

## 2017-06-15 MED ORDER — FLUCONAZOLE 150 MG PO TABS: 150.0000 mg | ORAL_TABLET | Freq: Once | ORAL | 0 refills | Status: AC

## 2017-06-15 NOTE — Progress Notes (Signed)
Patient here today requesting all STD testing. Patient reports vaginal itching since BV treatment. Reviewed with patient trichomonas, gonorrhea & chlamydia tests were negative a week ago but will do blood tests today. Diflucan sent in per protocol due to antibiotic induced yeast infection. Patient informed. Patient asked about annual exam. Patient does not have insurance. Recommended free pap screening clinic to patient and provided phone number. Patient reports frequent BV/yeast infections. Reviewed prevention techniques with patient. Patient verbalized understanding & had no other questions.

## 2017-06-16 LAB — RPR: RPR Ser Ql: NONREACTIVE

## 2017-06-16 LAB — HEPATITIS B SURFACE ANTIGEN: Hepatitis B Surface Ag: NEGATIVE

## 2017-06-16 LAB — HIV ANTIBODY (ROUTINE TESTING W REFLEX): HIV Screen 4th Generation wRfx: NONREACTIVE

## 2017-06-16 LAB — HEPATITIS C ANTIBODY: Hep C Virus Ab: <0.1 s/co ratio (ref 0.0–0.9)

## 2017-06-17 ENCOUNTER — Ambulatory Visit (HOSPITAL_COMMUNITY)
Admission: EM | Admit: 2017-06-17 | Discharge: 2017-06-17 | Disposition: A | Discharge status: Home / Self Care | Payer: Self-pay | Attending: Family Medicine | Admitting: Family Medicine

## 2017-06-17 ENCOUNTER — Other Ambulatory Visit: Payer: Self-pay

## 2017-06-17 ENCOUNTER — Encounter (HOSPITAL_COMMUNITY): Payer: Self-pay | Admitting: Emergency Medicine

## 2017-06-17 ENCOUNTER — Emergency Department (HOSPITAL_COMMUNITY)
Admission: EM | Admit: 2017-06-17 | Discharge: 2017-06-17 | Discharge status: Against Medical Advice | Payer: Self-pay | Attending: Emergency Medicine | Admitting: Emergency Medicine

## 2017-06-17 DIAGNOSIS — J309 Allergic rhinitis, unspecified: Secondary | ICD-10-CM

## 2017-06-17 DIAGNOSIS — Z5321 Procedure and treatment not carried out due to patient leaving prior to being seen by health care provider: Secondary | ICD-10-CM | POA: Insufficient documentation

## 2017-06-17 LAB — GROUP A STREP BY PCR: Group A Strep by PCR: NOT DETECTED

## 2017-06-17 MED ORDER — PREDNISONE 20 MG PO TABS: 40.0000 mg | ORAL_TABLET | Freq: Every day | ORAL | 0 refills | Status: DC

## 2017-06-17 MED ORDER — CETIRIZINE HCL 10 MG PO TABS: 10.0000 mg | ORAL_TABLET | Freq: Every day | ORAL | 0 refills | Status: DC

## 2017-06-17 MED ORDER — FLUTICASONE PROPIONATE 50 MCG/ACT NA SUSP: 2.0000 | Freq: Every day | NASAL | 0 refills | Status: DC

## 2017-06-17 MED ORDER — IPRATROPIUM BROMIDE 0.06 % NA SOLN: 2.0000 | Freq: Four times a day (QID) | NASAL | 0 refills | Status: DC

## 2017-06-17 NOTE — Progress Notes (Signed)
Subjective:   Patient ID: Sherri [name], female   DOB: 32 y.o.   MRN: [id number]   HPI Patient states my heels are still putting quite a bit with some improvement but still quite tender when I palpated   ROS      Objective:  Physical Exam  Neurovascular status intact with continued inflammation pain in the plantar bilateral     Assessment:  Acute plantar fascia it is improved but present bilateral     Plan:  H&P condition reviewed and went ahead and injected the plantar fascial bilateral 3 mg Kenalog 5 mg Xylocaine and instructed on physical therapy.  Dispensed over-the-counter insoles

## 2017-06-17 NOTE — ED Provider Notes (Signed)
Sherri [name]    CSN: [id number] Arrival date & time: 06/17/17  1722     History   Chief Complaint Chief Complaint  Patient presents with  . URI    HPI Sherri [name] is a 32 y.o. female.   32 year old female comes in for a 1 day history of sinus pressure, sneezing, sore throat.  She has had some rhinorrhea, nasal congestion. Denies fever, chills, night sweats. Has a history of seasonal allergies.  She is not currently on any antihistamines.  Has not tried anything for the symptoms. Never smoker.      Past Medical History:  Diagnosis Date  . Fibromyalgia    denies today  . Genital herpes   . Hypertension   . 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 Urinary tract infection   . Uterine fibroid     Patient Active Problem List   Diagnosis Date Noted  . Hypertension   . Genital herpes   . Yeast infection 12/03/2016  . Status post cesarean delivery 06/06/2014  . Uterine fibroid 12/25/2013  . Obesity 12/25/2013    Past Surgical History:  Procedure Laterality Date  . CESAREAN SECTION N/A 06/05/2014   Procedure: CESAREAN SECTION;  Surgeon: Truett Mainland, DO;  Location: [facility];  Service: Obstetrics;  Laterality: N/A;  . WISDOM TOOTH EXTRACTION      OB History    Gravida  1   Para  1   Term      Preterm  1   AB      Living  2     SAB      TAB      Ectopic      Multiple  1   Live Births  2            Home Medications    Prior to Admission medications   Medication Sig Start Date End Date Taking? Authorizing Provider  cetirizine (ZYRTEC) 10 MG tablet Take 1 tablet (10 mg total) by mouth daily. 06/17/17   Tasia Catchings, Amy V, PA-C  fluticasone (FLONASE) 50 MCG/ACT nasal spray Place 2 sprays into both nostrils daily. 06/17/17   Tasia Catchings, Amy V, PA-C  ipratropium (ATROVENT) 0.06 % nasal spray Place 2 sprays into both nostrils 4 (four) times daily. 06/17/17   Tasia Catchings, Amy V, PA-C  predniSONE (DELTASONE) 20 MG tablet Take 2 tablets (40 mg  total) by mouth daily for 5 days. 06/17/17 06/22/17  Ok Edwards, PA-C  tretinoin (RETIN-A) 0.025 % cream Apply 1 application topically at bedtime.    [provider]  triamcinolone cream (KENALOG) 0.1 % Apply 1 application topically 2 (two) times daily.    [provider]    Family History Family History  Problem Relation Age of Onset  . Hypertension Mother   . Hypertension Father   . Breast cancer Maternal Grandmother        67  . Colon cancer Neg Hx   . Colon polyps Neg Hx   . Kidney disease Neg Hx   . Diabetes Neg Hx   . Esophageal cancer Neg Hx   . Gallbladder disease Neg Hx   . Heart disease Neg Hx   . Asthma Neg Hx   . Cancer Neg Hx   . Stroke Neg Hx     Social History Social History   Tobacco Use  . Smoking status: Never Smoker  . Smokeless tobacco: Never Used  Substance Use Topics  . Alcohol use: No  Alcohol/week: 0.0 oz    Comment: occasional when not pregnant  . Drug use: No     Allergies   Patient has no known allergies.   Review of Systems Review of Systems  Reason unable to perform ROS: See HPI as above.     Physical Exam Triage Vital Signs ED Triage Vitals  Enc Vitals Group     BP 06/17/17 1810 (!) 142/97     Pulse Rate 06/17/17 1810 94     Resp 06/17/17 1810 18     Temp 06/17/17 1810 98.4 F (36.9 C)     Temp Source 06/17/17 1810 Oral     SpO2 06/17/17 1810 100 %     Weight --      Height --      Head Circumference --      Peak Flow --      Pain Score 06/17/17 1808 8     Pain Loc --      Pain Edu? --      Excl. in [place]? --    No data found.  Updated Vital Signs BP (!) 142/97 (BP Location: Right Arm)   Pulse 94   Temp 98.4 F (36.9 C) (Oral)   Resp 18   LMP 05/22/2017   SpO2 100%    Physical Exam  Constitutional: She is oriented to person, place, and time. She appears well-developed and well-nourished. No distress.  HENT:  Head: Normocephalic and atraumatic.  Right Ear: External ear and ear canal normal.  Tympanic membrane is erythematous. Tympanic membrane is not bulging.  Left Ear: Tympanic membrane, external ear and ear canal normal. Tympanic membrane is not erythematous and not bulging.  Nose: Mucosal edema and rhinorrhea present. Right sinus exhibits maxillary sinus tenderness and frontal sinus tenderness. Left sinus exhibits maxillary sinus tenderness and frontal sinus tenderness.  Mouth/Throat: Uvula is midline, oropharynx is clear and moist and mucous membranes are normal.  Eyes: Pupils are equal, round, and reactive to light. Conjunctivae are normal.  Neck: Normal range of motion. Neck supple.  Cardiovascular: Normal rate, regular rhythm and normal heart sounds. Exam reveals no gallop and no friction rub.  No murmur heard. Pulmonary/Chest: Effort normal and breath sounds normal. She has no decreased breath sounds. She has no wheezes. She has no rhonchi. She has no rales.  Lymphadenopathy:    She has no cervical adenopathy.  Neurological: She is alert and oriented to person, place, and time.  Skin: Skin is warm and dry.  Psychiatric: She has a normal mood and affect. Her behavior is normal. Judgment normal.     UC Treatments / Results  Labs (all labs ordered are listed, but only abnormal results are displayed) Labs Reviewed - No data to display  EKG None Radiology No results found.  Procedures Procedures (including critical care time)  Medications Ordered in UC Medications - No data to display   Initial Impression / Assessment and Plan / UC Course  I have reviewed the triage vital signs and the nursing notes.  Pertinent labs & imaging results that were available during my care of the patient were reviewed by me and considered in my medical decision making (see chart for details).    Discussed with patient history and exam most consistent with seasonal allergies versus viral URI. Symptomatic treatment as needed. Push fluids.  Will provide Rx of prednisone, patient can  take if sinus pressure not improving.  Return precautions given.  Patient expresses understanding and agrees with plan.  Final Clinical Impressions(s) / UC Diagnoses   Final diagnoses:  Allergic rhinitis, unspecified seasonality, unspecified trigger    ED Discharge Orders        Ordered    cetirizine (ZYRTEC) 10 MG tablet  Daily     06/17/17 1819    fluticasone (FLONASE) 50 MCG/ACT nasal spray  Daily     06/17/17 1819    ipratropium (ATROVENT) 0.06 % nasal spray  4 times daily     06/17/17 1819    predniSONE (DELTASONE) 20 MG tablet  Daily     06/17/17 1819        Ok Edwards, PA-C 06/17/17 1848

## 2017-06-17 NOTE — ED Triage Notes (Signed)
Sinus drainage, sneezing -onset yesterday.  Just not feeling well and has a sore throat.

## 2017-06-17 NOTE — Discharge Instructions (Signed)
Start flonase, atrovent nasal spray, zyrtec for nasal congestion/drainage. You can use over the counter nasal saline rinse such as neti pot for nasal congestion. Keep hydrated, your urine should be clear to pale yellow in color. Tylenol/motrin for fever and pain. Monitor for any worsening of symptoms, chest pain, shortness of breath, wheezing, swelling of the throat, follow up for reevaluation.   You can take prednisone as directed for sinus pressure if symptoms not improving with nasal sprays.  For sore throat try using a honey-based tea. Use 3 teaspoons of honey with juice squeezed from half lemon. Place shaved pieces of ginger into 1/2-1 cup of water and warm over stove top. Then mix the ingredients and repeat every 4 hours as needed.

## 2017-06-17 NOTE — Progress Notes (Signed)
I was present at the time of this nurse visit.  Agree with nursing documentation.   Dameisha Tschida P. Toshio Slusher, MD OB Fellow   

## 2017-06-17 NOTE — ED Triage Notes (Signed)
Patient c/o sore throat and sneezing x 2 days. Tried salt water rinses and OTC meds w/o relief.

## 2017-06-21 ENCOUNTER — Other Ambulatory Visit: Payer: Self-pay

## 2017-06-21 ENCOUNTER — Encounter (HOSPITAL_COMMUNITY): Payer: Self-pay

## 2017-06-21 ENCOUNTER — Telehealth (HOSPITAL_COMMUNITY): Payer: Self-pay

## 2017-06-21 ENCOUNTER — Ambulatory Visit (HOSPITAL_COMMUNITY)
Admission: EM | Admit: 2017-06-21 | Discharge: 2017-06-21 | Disposition: A | Discharge status: Home / Self Care | Payer: Self-pay | Attending: Family Medicine | Admitting: Family Medicine

## 2017-06-21 DIAGNOSIS — J320 Chronic maxillary sinusitis: Secondary | ICD-10-CM

## 2017-06-21 MED ORDER — METHYLPREDNISOLONE ACETATE 40 MG/ML IJ SUSP
INTRAMUSCULAR | Status: AC
Start: 2017-06-21 — End: 2017-06-21
  Filled 2017-06-21: qty 1

## 2017-06-21 MED ORDER — METHYLPREDNISOLONE ACETATE 40 MG/ML IJ SUSP
40.0000 mg | Freq: Once | INTRAMUSCULAR | Status: AC
  Administered 2017-06-21: 40 mg via INTRAMUSCULAR

## 2017-06-21 MED ORDER — AZITHROMYCIN 250 MG PO TABS: ORAL_TABLET | ORAL | 0 refills | Status: DC

## 2017-06-21 NOTE — ED Provider Notes (Signed)
[place]    CSN: [id number] Arrival date & time: 06/21/17  1002     History   Chief Complaint Chief Complaint  Patient presents with  . URI    HPI Sherri [name] is a 32 y.o. female.   Complains of congestion and head sinuses and ears.  Cough is productive of colored sputum.  She has been taking Zyrtec and using Flonase.  Also endorses lots of sneezing and itchy eyes.  HPI  Past Medical History:  Diagnosis Date  . Fibromyalgia    denies today  . Genital herpes   . Hypertension   . 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 Urinary tract infection   . Uterine fibroid     Patient Active Problem List   Diagnosis Date Noted  . Hypertension   . Genital herpes   . Yeast infection 12/03/2016  . Status post cesarean delivery 06/06/2014  . Uterine fibroid 12/25/2013  . Obesity 12/25/2013    Past Surgical History:  Procedure Laterality Date  . CESAREAN SECTION N/A 06/05/2014   Procedure: CESAREAN SECTION;  Surgeon: Truett Mainland, DO;  Location: [facility];  Service: Obstetrics;  Laterality: N/A;  . WISDOM TOOTH EXTRACTION      OB History    Gravida  1   Para  1   Term      Preterm  1   AB      Living  2     SAB      TAB      Ectopic      Multiple  1   Live Births  2            Home Medications    Prior to Admission medications   Medication Sig Start Date End Date Taking? Authorizing Provider  cetirizine (ZYRTEC) 10 MG tablet Take 1 tablet (10 mg total) by mouth daily. 06/17/17   Tasia Catchings, Amy V, PA-C  fluticasone (FLONASE) 50 MCG/ACT nasal spray Place 2 sprays into both nostrils daily. 06/17/17   Tasia Catchings, Amy V, PA-C  ipratropium (ATROVENT) 0.06 % nasal spray Place 2 sprays into both nostrils 4 (four) times daily. 06/17/17   Tasia Catchings, Amy V, PA-C  predniSONE (DELTASONE) 20 MG tablet Take 2 tablets (40 mg total) by mouth daily for 5 days. 06/17/17 06/22/17  Ok Edwards, PA-C  tretinoin (RETIN-A) 0.025 % cream Apply 1 application  topically at bedtime.    [provider]  triamcinolone cream (KENALOG) 0.1 % Apply 1 application topically 2 (two) times daily.    [provider]    Family History Family History  Problem Relation Age of Onset  . Hypertension Mother   . Hypertension Father   . Breast cancer Maternal Grandmother        59  . Colon cancer Neg Hx   . Colon polyps Neg Hx   . Kidney disease Neg Hx   . Diabetes Neg Hx   . Esophageal cancer Neg Hx   . Gallbladder disease Neg Hx   . Heart disease Neg Hx   . Asthma Neg Hx   . Cancer Neg Hx   . Stroke Neg Hx     Social History Social History   Tobacco Use  . Smoking status: Never Smoker  . Smokeless tobacco: Never Used  Substance Use Topics  . Alcohol use: No    Alcohol/week: 0.0 oz    Comment: occasional when not pregnant  . Drug use: No  Allergies   Patient has no known allergies.   Review of Systems Review of Systems  Constitutional: Negative.   HENT: Positive for congestion, postnasal drip and sinus pressure.   Eyes: Positive for itching.  Respiratory: Positive for cough.   Cardiovascular: Negative.   Gastrointestinal: Negative.   Neurological: Negative.      Physical Exam Triage Vital Signs ED Triage Vitals [06/21/17 1020]  Enc Vitals Group     BP (!) 153/87     Pulse Rate 87     Resp 19     Temp 98.8 F (37.1 C)     Temp src      SpO2 99 %     Weight 170 lb (77.1 kg)     Height      Head Circumference      Peak Flow      Pain Score 0     Pain Loc      Pain Edu?      Excl. in [place]?    No data found.  Updated Vital Signs BP (!) 153/87   Pulse 87   Temp 98.8 F (37.1 C)   Resp 19   Wt 170 lb (77.1 kg)   LMP 06/21/2017   SpO2 99%   BMI 34.34 kg/m   Visual Acuity Right Eye Distance:   Left Eye Distance:   Bilateral Distance:    Right Eye Near:   Left Eye Near:    Bilateral Near:     Physical Exam  Constitutional: She appears well-developed and well-nourished.  HENT:  Head:  Normocephalic.  Mouth/Throat: No oropharyngeal exudate.  Sinuses are tender to percussion  Eyes: Pupils are equal, round, and reactive to light. Conjunctivae are normal.  Cardiovascular: Normal rate and regular rhythm.  Pulmonary/Chest: Effort normal and breath sounds normal.     UC Treatments / Results  Labs (all labs ordered are listed, but only abnormal results are displayed) Labs Reviewed - No data to display  EKG None Radiology No results found.  Procedures Procedures (including critical care time)  Medications Ordered in UC Medications - No data to display   Initial Impression / Assessment and Plan / UC Course  I have reviewed the triage vital signs and the nursing notes.  Pertinent labs & imaging results that were available during my care of the patient were reviewed by me and considered in my medical decision making (see chart for details).     Sinusitis.  Final Clinical Impressions(s) / UC Diagnoses   Final diagnoses:  None    ED Discharge Orders    None       Controlled Substance Prescriptions [place] Controlled Substance Registry consulted? No   Wardell Honour, MD 06/21/17 1114

## 2017-06-21 NOTE — ED Triage Notes (Signed)
Pt presents with complaints of congestion since Thursday.

## 2017-06-22 ENCOUNTER — Ambulatory Visit: Payer: PRIVATE HEALTH INSURANCE | Admitting: Family Medicine

## 2017-06-22 ENCOUNTER — Encounter (HOSPITAL_COMMUNITY): Payer: Self-pay | Admitting: Emergency Medicine

## 2017-06-22 ENCOUNTER — Other Ambulatory Visit: Payer: Self-pay

## 2017-06-22 ENCOUNTER — Ambulatory Visit (HOSPITAL_COMMUNITY)
Admission: EM | Admit: 2017-06-22 | Discharge: 2017-06-22 | Disposition: A | Discharge status: Home / Self Care | Payer: Self-pay | Attending: Family Medicine | Admitting: Family Medicine

## 2017-06-22 DIAGNOSIS — J069 Acute upper respiratory infection, unspecified: Secondary | ICD-10-CM

## 2017-06-22 DIAGNOSIS — B9789 Other viral agents as the cause of diseases classified elsewhere: Secondary | ICD-10-CM

## 2017-06-22 MED ORDER — CETIRIZINE HCL 10 MG PO TABS: 10.0000 mg | ORAL_TABLET | Freq: Every day | ORAL | 0 refills | Status: DC

## 2017-06-22 MED ORDER — PSEUDOEPH-BROMPHEN-DM 30-2-10 MG/5ML PO SYRP: 5.0000 mL | ORAL_SOLUTION | Freq: Four times a day (QID) | ORAL | 0 refills | Status: DC | PRN

## 2017-06-22 MED ORDER — FLUTICASONE PROPIONATE 50 MCG/ACT NA SUSP: 2.0000 | Freq: Every day | NASAL | 0 refills | Status: DC

## 2017-06-22 NOTE — ED Triage Notes (Signed)
C/o bilateral ear pain, head congestion and slight cough onset 2 days

## 2017-06-22 NOTE — ED Provider Notes (Signed)
[place]    CSN: [id number] Arrival date & time: 06/22/17  1316     History   Chief Complaint Chief Complaint  Patient presents with  . Otalgia    HPI ADAYSHA DUBINSKY is a 32 y.o. female history of hypertension presenting today for evaluation of ear pain, nasal congestion and cough.  Symptoms have been going on since Thursday, approximately 5 days.  Patient was seen here yesterday for similar symptoms and provided with azithromycin.  Patient has taken this yesterday and today.  She also received a steroid shot yesterday.  She also notes that she has been doing Zyrtec and Flonase for a couple of days without relief.  Denies sore throat.  Patient also noting to have diarrhea which is been occurring for approximately 2 days.  Denies any blood in the stool.  Denies nausea or vomiting, mild abdominal cramping related to bowel movements.  Decreased oral intake, but tolerating.  HPI  Past Medical History:  Diagnosis Date  . Fibromyalgia    denies today  . Genital herpes   . Hypertension   . 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 Urinary tract infection   . Uterine fibroid     Patient Active Problem List   Diagnosis Date Noted  . Hypertension   . Genital herpes   . Yeast infection 12/03/2016  . Status post cesarean delivery 06/06/2014  . Uterine fibroid 12/25/2013  . Obesity 12/25/2013    Past Surgical History:  Procedure Laterality Date  . CESAREAN SECTION N/A 06/05/2014   Procedure: CESAREAN SECTION;  Surgeon: Truett Mainland, DO;  Location: [facility];  Service: Obstetrics;  Laterality: N/A;  . WISDOM TOOTH EXTRACTION      OB History    Gravida  1   Para  1   Term      Preterm  1   AB      Living  2     SAB      TAB      Ectopic      Multiple  1   Live Births  2            Home Medications    Prior to Admission medications   Medication Sig Start Date End Date Taking? Authorizing Provider  azithromycin  (ZITHROMAX Z-PAK) 250 MG tablet Take as directed 06/21/17   Wardell Honour, MD  brompheniramine-pseudoephedrine-DM 30-2-10 MG/5ML syrup Take 5 mLs by mouth 4 (four) times daily as needed. 06/22/17   Wieters, Hallie C, PA-C  cetirizine (ZYRTEC) 10 MG tablet Take 1 tablet (10 mg total) by mouth daily. 06/22/17   Wieters, Hallie C, PA-C  fluticasone (FLONASE) 50 MCG/ACT nasal spray Place 2 sprays into both nostrils daily. 06/22/17   Wieters, Hallie C, PA-C  triamcinolone cream (KENALOG) 0.1 % Apply 1 application topically 2 (two) times daily.    [provider]    Family History Family History  Problem Relation Age of Onset  . Hypertension Mother   . Hypertension Father   . Breast cancer Maternal Grandmother        68  . Colon cancer Neg Hx   . Colon polyps Neg Hx   . Kidney disease Neg Hx   . Diabetes Neg Hx   . Esophageal cancer Neg Hx   . Gallbladder disease Neg Hx   . Heart disease Neg Hx   . Asthma Neg Hx   . Cancer Neg Hx   . Stroke Neg  Hx     Social History Social History   Tobacco Use  . Smoking status: Never Smoker  . Smokeless tobacco: Never Used  Substance Use Topics  . Alcohol use: No    Alcohol/week: 0.0 oz    Comment: occasional when not pregnant  . Drug use: No     Allergies   Patient has no known allergies.   Review of Systems Review of Systems  Constitutional: Negative for chills, fatigue and fever.  HENT: Positive for congestion, ear pain and rhinorrhea. Negative for sinus pressure, sore throat and trouble swallowing.   Respiratory: Positive for cough. Negative for chest tightness and shortness of breath.   Cardiovascular: Negative for chest pain.  Gastrointestinal: Negative for abdominal pain, nausea and vomiting.  Musculoskeletal: Negative for myalgias.  Skin: Negative for rash.  Neurological: Negative for dizziness, light-headedness and headaches.     Physical Exam Triage Vital Signs ED Triage Vitals  Enc Vitals Group     BP  06/22/17 1343 138/90     Pulse Rate 06/22/17 1343 74     Resp --      Temp 06/22/17 1343 98.6 F (37 C)     Temp Source 06/22/17 1343 Oral     SpO2 06/22/17 1343 100 %     Weight --      Height --      Head Circumference --      Peak Flow --      Pain Score 06/22/17 1341 9     Pain Loc --      Pain Edu? --      Excl. in [place]? --    No data found.  Updated Vital Signs BP 138/90 (BP Location: Left Arm)   Pulse 74   Temp 98.6 F (37 C) (Oral)   LMP 06/21/2017   SpO2 100%   Visual Acuity Right Eye Distance:   Left Eye Distance:   Bilateral Distance:    Right Eye Near:   Left Eye Near:    Bilateral Near:     Physical Exam  Constitutional: She appears well-developed and well-nourished. No distress.  HENT:  Head: Normocephalic and atraumatic.  Bilateral TMs not erythematous, nasal mucosa erythematous with swollen turbinates, posterior oropharynx erythematous, no tonsillar enlargement or exudate.  Eyes: Conjunctivae are normal.  Neck: Neck supple.  Cardiovascular: Normal rate and regular rhythm.  No murmur heard. Pulmonary/Chest: Effort normal and breath sounds normal. No respiratory distress.  Breathing comfortably at rest, CTA BL  Abdominal: Soft. There is tenderness.  Tenderness diffusely across abdomen, negative rebound, negative McBurney's, negative Rovsing.  Musculoskeletal: She exhibits no edema.  Neurological: She is alert.  Skin: Skin is warm and dry.  Psychiatric: She has a normal mood and affect.  Nursing note and vitals reviewed.    UC Treatments / Results  Labs (all labs ordered are listed, but only abnormal results are displayed) Labs Reviewed - No data to display  EKG None Radiology No results found.  Procedures Procedures (including critical care time)  Medications Ordered in UC Medications - No data to display   Initial Impression / Assessment and Plan / UC Course  I have reviewed the triage vital signs and the nursing  notes.  Pertinent labs & imaging results that were available during my care of the patient were reviewed by me and considered in my medical decision making (see chart for details).     Patient likely with allergic rhinitis versus viral URI.  We will can recommend to  continue symptomatic management.  Will add in Afrin to help with her ear pain.  Continue azithromycin she received yesterday.  Brompheniramine/pseudoephedrine/DM cough syrup provided. Discussed strict return precautions. Patient verbalized understanding and is agreeable with plan.   Final Clinical Impressions(s) / UC Diagnoses   Final diagnoses:  Viral URI with cough    ED Discharge Orders        Ordered    fluticasone (FLONASE) 50 MCG/ACT nasal spray  Daily     06/22/17 1358    brompheniramine-pseudoephedrine-DM 30-2-10 MG/5ML syrup  4 times daily PRN     06/22/17 1358    cetirizine (ZYRTEC) 10 MG tablet  Daily     06/22/17 1358       Controlled Substance Prescriptions [place] Controlled Substance Registry consulted? Not Applicable   Janith Lima, [place] 06/22/17 1408

## 2017-06-22 NOTE — Discharge Instructions (Signed)
Please continue daily Zyrtec and Flonase nasal spray.  Please use cough syrup prescribed to help with your cough-this has a decongestant in it which should help with congestion also.  He may also use Afrin at bedtime to help with congestion and ear pain.  Do not use more than 3-4 days as it may worsen your congestion.  Please finish the antibiotic prescribed to you yesterday when you are here.  Please get take medications consistently and I expect your symptoms to improve over the next 4-5 days.  Please return if symptoms not improving, if worsening or changing.

## 2017-06-25 ENCOUNTER — Encounter (HOSPITAL_COMMUNITY): Payer: Self-pay | Admitting: Emergency Medicine

## 2017-06-25 ENCOUNTER — Ambulatory Visit (HOSPITAL_COMMUNITY)
Admission: EM | Admit: 2017-06-25 | Discharge: 2017-06-25 | Disposition: A | Discharge status: Home / Self Care | Payer: Self-pay | Attending: Family Medicine | Admitting: Family Medicine

## 2017-06-25 DIAGNOSIS — R197 Diarrhea, unspecified: Secondary | ICD-10-CM | POA: Insufficient documentation

## 2017-06-25 DIAGNOSIS — R300 Dysuria: Secondary | ICD-10-CM

## 2017-06-25 DIAGNOSIS — J324 Chronic pansinusitis: Secondary | ICD-10-CM | POA: Insufficient documentation

## 2017-06-25 DIAGNOSIS — M797 Fibromyalgia: Secondary | ICD-10-CM | POA: Insufficient documentation

## 2017-06-25 DIAGNOSIS — I10 Essential (primary) hypertension: Secondary | ICD-10-CM | POA: Insufficient documentation

## 2017-06-25 DIAGNOSIS — R05 Cough: Secondary | ICD-10-CM | POA: Insufficient documentation

## 2017-06-25 LAB — POCT URINALYSIS DIP (DEVICE)
Bilirubin Urine: NEGATIVE
Glucose, UA: NEGATIVE mg/dL
Ketones, ur: NEGATIVE mg/dL
Leukocytes, UA: NEGATIVE
Nitrite: NEGATIVE
Protein, ur: NEGATIVE mg/dL
Specific Gravity, Urine: 1.025 (ref 1.005–1.030)
Urobilinogen, UA: 0.2 mg/dL (ref 0.0–1.0)
pH: 6 (ref 5.0–8.0)

## 2017-06-25 LAB — POCT RAPID STREP A: Streptococcus, Group A Screen (Direct): NEGATIVE

## 2017-06-25 LAB — POCT INFECTIOUS MONO SCREEN: Mono Screen: NEGATIVE

## 2017-06-25 MED ORDER — IBUPROFEN 400 MG PO TABS: 400.0000 mg | ORAL_TABLET | Freq: Four times a day (QID) | ORAL | 0 refills | Status: DC | PRN

## 2017-06-25 NOTE — ED Triage Notes (Signed)
Pt c/o cough, runny nose x1 week

## 2017-06-25 NOTE — Discharge Instructions (Addendum)
Your strep and mono are negative today. Your urine does not indicate any signs of urinary tract infection. You have completed antibiotics which should cover most possible causes of your symptoms.  Continue with previously prescribed medications, may further try nasal saline rinses to help with sinus/facial pressure.  Push fluids to ensure adequate hydration and keep secretions thin.  Tylenol and/or ibuprofen as needed for pain or fevers.   Please establish with a primary care provider.

## 2017-06-25 NOTE — ED Provider Notes (Signed)
[place]    CSN: [id number] Arrival date & time: 06/25/17  1652     History   Chief Complaint Chief Complaint  Patient presents with  . Cough    HPI Sherri [name] is a 32 y.o. female.   Patient presents with complaints of persistent and worsening of facial pounding, runny nose, productive cough, ear pressure which started approximately 8 days ago. Has been seen 3 times since 4/18. Was treated for allergic rhinitis and has been taking daily zyrtec and flonase, was provided a steroid shot as well as azithromycin and cough syrup. These have not helped symptoms. Last dose of azithromycin was today. States has some stomach upset and diarrhea, without blood. Had approximately 6 episodes of diarrhea yesterday and 3 episodes today. Some frequency and burning with urination. No known ill contacts. Has had similar in the past but has not lasted this long. Hx of fibromyalgia, htn, uti.    ROS per HPI.      Past Medical History:  Diagnosis Date  . Fibromyalgia    denies today  . Genital herpes   . Hypertension   . 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 Urinary tract infection   . Uterine fibroid     Patient Active Problem List   Diagnosis Date Noted  . Hypertension   . Genital herpes   . Yeast infection 12/03/2016  . Status post cesarean delivery 06/06/2014  . Uterine fibroid 12/25/2013  . Obesity 12/25/2013    Past Surgical History:  Procedure Laterality Date  . CESAREAN SECTION N/A 06/05/2014   Procedure: CESAREAN SECTION;  Surgeon: Truett Mainland, DO;  Location: [facility];  Service: Obstetrics;  Laterality: N/A;  . WISDOM TOOTH EXTRACTION      OB History    Gravida  1   Para  1   Term      Preterm  1   AB      Living  2     SAB      TAB      Ectopic      Multiple  1   Live Births  2            Home Medications    Prior to Admission medications   Medication Sig Start Date End Date Taking? Authorizing  Provider  azithromycin (ZITHROMAX Z-PAK) 250 MG tablet Take as directed Patient not taking: Reported on 06/25/2017 06/21/17   Wardell Honour, MD  brompheniramine-pseudoephedrine-DM 30-2-10 MG/5ML syrup Take 5 mLs by mouth 4 (four) times daily as needed. 06/22/17   Wieters, Hallie C, PA-C  cetirizine (ZYRTEC) 10 MG tablet Take 1 tablet (10 mg total) by mouth daily. 06/22/17   Wieters, Hallie C, PA-C  fluticasone (FLONASE) 50 MCG/ACT nasal spray Place 2 sprays into both nostrils daily. 06/22/17   Wieters, Hallie C, PA-C  triamcinolone cream (KENALOG) 0.1 % Apply 1 application topically 2 (two) times daily.    [provider]    Family History Family History  Problem Relation Age of Onset  . Hypertension Mother   . Hypertension Father   . Breast cancer Maternal Grandmother        25  . Colon cancer Neg Hx   . Colon polyps Neg Hx   . Kidney disease Neg Hx   . Diabetes Neg Hx   . Esophageal cancer Neg Hx   . Gallbladder disease Neg Hx   . Heart disease Neg Hx   . Asthma Neg  Hx   . Cancer Neg Hx   . Stroke Neg Hx     Social History Social History   Tobacco Use  . Smoking status: Never Smoker  . Smokeless tobacco: Never Used  Substance Use Topics  . Alcohol use: No    Alcohol/week: 0.0 oz    Comment: occasional when not pregnant  . Drug use: No     Allergies   Patient has no known allergies.   Review of Systems Review of Systems   Physical Exam Triage Vital Signs ED Triage Vitals [06/25/17 1711]  Enc Vitals Group     BP (!) 141/91     Pulse Rate 85     Resp 18     Temp 98.6 F (37 C)     Temp src      SpO2 100 %     Weight      Height      Head Circumference      Peak Flow      Pain Score 0     Pain Loc      Pain Edu?      Excl. in [place]?    No data found.  Updated Vital Signs BP (!) 141/91   Pulse 85   Temp 98.6 F (37 C)   Resp 18   LMP 06/21/2017   SpO2 100%    Physical Exam  Constitutional: She is oriented to person, place, and time.  She appears well-developed and well-nourished. No distress.  HENT:  Head: Normocephalic and atraumatic.  Right Ear: Tympanic membrane, external ear and ear canal normal.  Left Ear: Tympanic membrane, external ear and ear canal normal.  Nose: Rhinorrhea present. Right sinus exhibits maxillary sinus tenderness and frontal sinus tenderness. Left sinus exhibits maxillary sinus tenderness and frontal sinus tenderness.  Mouth/Throat: Uvula is midline and mucous membranes are normal. Posterior oropharyngeal erythema present. Tonsils are 1+ on the right. Tonsils are 1+ on the left. Tonsillar exudate.  Post oropharynx with ulcerations noted as well as exudate to tonsils noted   Eyes: Pupils are equal, round, and reactive to light. Conjunctivae and EOM are normal.  Cardiovascular: Normal rate, regular rhythm and normal heart sounds.  Pulmonary/Chest: Effort normal and breath sounds normal.  Abdominal: Soft. Bowel sounds are normal. There is generalized tenderness.  Neurological: She is alert and oriented to person, place, and time.  Skin: Skin is warm and dry.     UC Treatments / Results  Labs (all labs ordered are listed, but only abnormal results are displayed) Labs Reviewed  POCT URINALYSIS DIP (DEVICE) - Abnormal; Notable for the following components:      Result Value   Hgb urine dipstick TRACE (*)    All other components within normal limits  CULTURE, GROUP A STREP [facility])  POCT RAPID STREP A  POCT INFECTIOUS MONO SCREEN    EKG None Radiology No results found.  Procedures Procedures (including critical care time)  Medications Ordered in UC Medications - No data to display   Initial Impression / Assessment and Plan / UC Course  I have reviewed the triage vital signs and the nursing notes.  Pertinent labs & imaging results that were available during my care of the patient were reviewed by me and considered in my medical decision making (see chart for details).     Afebrile.  Non toxic in appearance. Facial sinus tenderness, exudate to throat, she took last dose of azithromycin today. Using flonase, has had steroid shot already which  did not help. Negative strep, urine negative, negative mono. Generalized abdominal discomfort with diarrhea. Eating and drinking, without nausea or vomiting. Appears still consistent with viral sinusitis. Continue with previously prescribed medications. Nasal rinses. Bland diet as tolerated to help with diarrhea symptoms. Non specific abdominal exam. Patient verbalized understanding and agreeable to plan.    Final Clinical Impressions(s) / UC Diagnoses   Final diagnoses:  Pansinusitis, unspecified chronicity  Diarrhea, unspecified type    ED Discharge Orders    None       Controlled Substance Prescriptions [place] Controlled Substance Registry consulted? Not Applicable   Zigmund Gottron, NP 06/25/17 [phone]

## 2017-06-28 LAB — CULTURE, GROUP A STREP (THRC)

## 2017-06-28 NOTE — Progress Notes (Signed)
Negative strep

## 2017-06-29 ENCOUNTER — Encounter (HOSPITAL_COMMUNITY): Payer: Self-pay | Admitting: Emergency Medicine

## 2017-06-29 ENCOUNTER — Ambulatory Visit (HOSPITAL_COMMUNITY)
Admission: EM | Admit: 2017-06-29 | Discharge: 2017-06-29 | Disposition: A | Discharge status: Home / Self Care | Payer: Self-pay | Attending: Family Medicine | Admitting: Family Medicine

## 2017-06-29 DIAGNOSIS — J301 Allergic rhinitis due to pollen: Secondary | ICD-10-CM

## 2017-06-29 DIAGNOSIS — Z8719 Personal history of other diseases of the digestive system: Secondary | ICD-10-CM

## 2017-06-29 DIAGNOSIS — K0889 Other specified disorders of teeth and supporting structures: Secondary | ICD-10-CM

## 2017-06-29 DIAGNOSIS — K1379 Other lesions of oral mucosa: Secondary | ICD-10-CM

## 2017-06-29 MED ORDER — CHLORHEXIDINE GLUCONATE 0.12 % MT SOLN: 15.0000 mL | Freq: Two times a day (BID) | OROMUCOSAL | 0 refills | Status: DC

## 2017-06-29 MED ORDER — PREDNISONE 10 MG PO TABS: 10.0000 mg | ORAL_TABLET | Freq: Every day | ORAL | 0 refills | Status: DC

## 2017-06-29 NOTE — ED Provider Notes (Signed)
[place]   [id number] 06/29/17 Arrival Time: 41   SUBJECTIVE:  Sherri [name] is a 32 y.o. female who presents to the urgent care with complaint of mouth pain with some ulcers.  They began two days ago.  Patient has been suffering from allergies for two weeks as well with watery, itchy eyes and nasal stuffiness.   Past Medical History:  Diagnosis Date  . Fibromyalgia    denies today  . Genital herpes   . Hypertension   . 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 Urinary tract infection   . Uterine fibroid    Family History  Problem Relation Age of Onset  . Hypertension Mother   . Hypertension Father   . Breast cancer Maternal Grandmother        15  . Colon cancer Neg Hx   . Colon polyps Neg Hx   . Kidney disease Neg Hx   . Diabetes Neg Hx   . Esophageal cancer Neg Hx   . Gallbladder disease Neg Hx   . Heart disease Neg Hx   . Asthma Neg Hx   . Cancer Neg Hx   . Stroke Neg Hx    Social History   Socioeconomic History  . Marital status: Single    Spouse name: Not on file  . Number of children: Not on file  . Years of education: Not on file  . Highest education level: Not on file  Occupational History  . Occupation: [employer]  . Financial resource strain: Not on file  . Food insecurity:    Worry: Not on file    Inability: Not on file  . Transportation needs:    Medical: Not on file    Non-medical: Not on file  Tobacco Use  . Smoking status: Never Smoker  . Smokeless tobacco: Never Used  Substance and Sexual Activity  . Alcohol use: No    Alcohol/week: 0.0 oz    Comment: occasional when not pregnant  . Drug use: No  . Sexual activity: Yes    Birth control/protection: Condom  Lifestyle  . Physical activity:    Days per week: Not on file    Minutes per session: Not on file  . Stress: Not on file  Relationships  . Social connections:    Talks on phone: Not on file    Gets together: Not on file    Attends religious service: Not on file    Active member of club or organization: Not on file    Attends meetings of clubs or organizations: Not on file    Relationship status: Not on file  . Intimate partner violence:    Fear of current or ex partner: Not on file    Emotionally abused: Not on file    Physically abused: Not on file    Forced sexual activity: Not on file  Other Topics Concern  . Not on file  Social History Narrative  . Not on file   No outpatient medications have been marked as taking for the 06/29/17 encounter [facility] Encounter).   No Known Allergies    ROS: As per HPI, remainder of ROS negative.   OBJECTIVE:   Vitals:   06/29/17 1349  BP: (!) 145/89  Pulse: 98  Resp: 18  Temp: 98.7 F (37.1 C)  TempSrc: Oral  SpO2: 96%     General appearance: alert; no distress Eyes: PERRL; EOMI; conjunctiva normal HENT: normocephalic; atraumatic; TMs normal, canal normal,  external ears normal without trauma; nasal mucosa normal; oral mucosa aphthous ulcers Neck: supple Back: no CVA tenderness Extremities: no cyanosis or edema; symmetrical with no gross deformities Skin: warm and dry Neurologic: normal gait; grossly normal Psychological: alert and cooperative; normal mood and affect      Labs:  Results for orders placed or performed during the hospital encounter of 06/25/17  Culture, group A strep  Result Value Ref Range   Specimen Description THROAT    Special Requests NONE    Culture      NO GROUP A STREP (S.PYOGENES) ISOLATED Performed at [facility], [place] [address], [place], [place] [address]    Report Status 06/28/2017 FINAL   POCT rapid strep A [facility])  Result Value Ref Range   Streptococcus, Group A Screen (Direct) NEGATIVE NEGATIVE  POCT urinalysis dip (device)  Result Value Ref Range   Glucose, UA NEGATIVE NEGATIVE mg/dL   Bilirubin Urine NEGATIVE NEGATIVE   Ketones, ur NEGATIVE NEGATIVE mg/dL   Specific Gravity, Urine  1.025 1.005 - 1.030   Hgb urine dipstick TRACE (A) NEGATIVE   pH 6.0 5.0 - 8.0   Protein, ur NEGATIVE NEGATIVE mg/dL   Urobilinogen, UA 0.2 0.0 - 1.0 mg/dL   Nitrite NEGATIVE NEGATIVE   Leukocytes, UA NEGATIVE NEGATIVE  Infectious mono screen, POC  Result Value Ref Range   Mono Screen NEGATIVE NEGATIVE    Labs Reviewed - No data to display  No results found.     ASSESSMENT & PLAN:  1. Seasonal allergic rhinitis due to pollen   2. H/O oral aphthous ulcers     Meds ordered this encounter  Medications  . predniSONE (DELTASONE) 10 MG tablet    Sig: Take 1 tablet (10 mg total) by mouth daily with breakfast.    Dispense:  5 tablet    Refill:  0  . chlorhexidine (PERIDEX) 0.12 % solution    Sig: Use as directed 15 mLs in the mouth or throat 2 (two) times daily.    Dispense:  120 mL    Refill:  0    Reviewed expectations re: course of current medical issues. Questions answered. Outlined signs and symptoms indicating need for more acute intervention. Patient verbalized understanding. After Visit Summary given.    Procedures:      Robyn Haber, MD 06/29/17 1401

## 2017-06-29 NOTE — ED Triage Notes (Signed)
Pt here for mouth pain with some ulcers

## 2017-07-20 ENCOUNTER — Encounter (HOSPITAL_COMMUNITY): Payer: Self-pay | Admitting: Emergency Medicine

## 2017-07-20 ENCOUNTER — Ambulatory Visit (HOSPITAL_COMMUNITY)
Admission: EM | Admit: 2017-07-20 | Discharge: 2017-07-20 | Disposition: A | Discharge status: Home / Self Care | Payer: Self-pay | Attending: Family Medicine | Admitting: Family Medicine

## 2017-07-20 ENCOUNTER — Other Ambulatory Visit: Payer: Self-pay

## 2017-07-20 ENCOUNTER — Ambulatory Visit (INDEPENDENT_AMBULATORY_CARE_PROVIDER_SITE_OTHER): Payer: Self-pay

## 2017-07-20 DIAGNOSIS — K5900 Constipation, unspecified: Secondary | ICD-10-CM

## 2017-07-20 DIAGNOSIS — Z3202 Encounter for pregnancy test, result negative: Secondary | ICD-10-CM

## 2017-07-20 DIAGNOSIS — R112 Nausea with vomiting, unspecified: Secondary | ICD-10-CM

## 2017-07-20 DIAGNOSIS — R1013 Epigastric pain: Secondary | ICD-10-CM

## 2017-07-20 LAB — POCT URINALYSIS DIP (DEVICE)
Bilirubin Urine: NEGATIVE
Glucose, UA: NEGATIVE mg/dL
Hgb urine dipstick: NEGATIVE
Leukocytes, UA: NEGATIVE
Nitrite: NEGATIVE
Protein, ur: NEGATIVE mg/dL
Specific Gravity, Urine: 1.025 (ref 1.005–1.030)
Urobilinogen, UA: 0.2 mg/dL (ref 0.0–1.0)
pH: 5.5 (ref 5.0–8.0)

## 2017-07-20 LAB — POCT I-STAT, CHEM 8
BUN: 7 mg/dL (ref 6–20)
Calcium, Ion: 1.17 mmol/L (ref 1.15–1.40)
Chloride: 105 mmol/L (ref 101–111)
Creatinine, Ser: 0.8 mg/dL (ref 0.44–1.00)
Glucose, Bld: 97 mg/dL (ref 65–99)
HCT: 37 % (ref 36.0–46.0)
Hemoglobin: 12.6 g/dL (ref 12.0–15.0)
Potassium: 3.8 mmol/L (ref 3.5–5.1)
Sodium: 140 mmol/L (ref 135–145)
TCO2: 24 mmol/L (ref 22–32)

## 2017-07-20 LAB — POCT PREGNANCY, URINE: Preg Test, Ur: NEGATIVE

## 2017-07-20 MED ORDER — ONDANSETRON 4 MG PO TBDP
ORAL_TABLET | ORAL | Status: AC
  Filled 2017-07-20: qty 1

## 2017-07-20 MED ORDER — ONDANSETRON 4 MG PO TBDP
4.0000 mg | ORAL_TABLET | Freq: Once | ORAL | Status: AC
  Administered 2017-07-20: 4 mg via ORAL

## 2017-07-20 MED ORDER — GI COCKTAIL ~~LOC~~
30.0000 mL | Freq: Once | ORAL | Status: AC
  Administered 2017-07-20: 30 mL via ORAL

## 2017-07-20 MED ORDER — GI COCKTAIL ~~LOC~~
ORAL | Status: AC
  Filled 2017-07-20: qty 30

## 2017-07-20 MED ORDER — RANITIDINE HCL 150 MG PO CAPS: 150.0000 mg | ORAL_CAPSULE | Freq: Every day | ORAL | 0 refills | Status: DC

## 2017-07-20 MED ORDER — ONDANSETRON HCL 4 MG PO TABS: 4.0000 mg | ORAL_TABLET | Freq: Three times a day (TID) | ORAL | 0 refills | Status: DC | PRN

## 2017-07-20 MED ORDER — POLYETHYLENE GLYCOL 3350 17 GM/SCOOP PO POWD: 17.0000 g | Freq: Every day | ORAL | 0 refills | Status: DC

## 2017-07-20 NOTE — ED Triage Notes (Signed)
Pt reports weakness and nausea x3 days.

## 2017-07-20 NOTE — ED Provider Notes (Signed)
[place]    CSN: [id number] Arrival date & time: 07/20/17  1246     History   Chief Complaint Chief Complaint  Patient presents with  . Fatigue  . Nausea    HPI Sherri [name] is a 32 y.o. female.   Sherri [name] presents with complaints of vomiting, fatigue and abdominal pain which started 3 days ago. She states anything she eats comes back up. She feels food just sits in her upper abdomen which causes nausea. Has vomited twice today, yesterday x5. States had diarrhea a few days ago, none today. No blood. States she may have been constipated prior to diarrhea. No fevers. No uri symptoms. States she feels weak and lightheaded. Denies previous, although per chart review it does appear she has had vomiting abdominal pain and diarrhea multiple times over the past year. Denies any known questionable intake prior to symptoms starting although had eaten at [place]. Pain is worse to upper abdomen and worse with eating. She has been urinating. Tried taking pepto bismul which has not helped. Hx of fibromyalgia, htn, utis.    ROS per HPI.      Past Medical History:  Diagnosis Date  . Fibromyalgia    denies today  . Genital herpes   . Hypertension   . 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 Urinary tract infection   . Uterine fibroid     Patient Active Problem List   Diagnosis Date Noted  . Hypertension   . Genital herpes   . Yeast infection 12/03/2016  . Status post cesarean delivery 06/06/2014  . Uterine fibroid 12/25/2013  . Obesity 12/25/2013    Past Surgical History:  Procedure Laterality Date  . CESAREAN SECTION N/A 06/05/2014   Procedure: CESAREAN SECTION;  Surgeon: Truett Mainland, DO;  Location: [facility];  Service: Obstetrics;  Laterality: N/A;  . WISDOM TOOTH EXTRACTION      OB History    Gravida  1   Para  1   Term      Preterm  1   AB      Living  2     SAB      TAB      Ectopic      Multiple  1   Live  Births  2            Home Medications    Prior to Admission medications   Medication Sig Start Date End Date Taking? Authorizing Provider  chlorhexidine (PERIDEX) 0.12 % solution Use as directed 15 mLs in the mouth or throat 2 (two) times daily. 06/29/17   Robyn Haber, MD  fluticasone (FLONASE) 50 MCG/ACT nasal spray Place 2 sprays into both nostrils daily. 06/22/17   Wieters, Hallie C, PA-C  ibuprofen (ADVIL,MOTRIN) 400 MG tablet Take 1 tablet (400 mg total) by mouth every 6 (six) hours as needed. 06/25/17   Zigmund Gottron, NP  ondansetron (ZOFRAN) 4 MG tablet Take 1 tablet (4 mg total) by mouth every 8 (eight) hours as needed for nausea or vomiting. 07/20/17   Augusto Gamble B, NP  polyethylene glycol powder (GLYCOLAX/MIRALAX) powder Take 17 g by mouth daily. 07/20/17   Zigmund Gottron, NP  ranitidine (ZANTAC) 150 MG capsule Take 1 capsule (150 mg total) by mouth daily. 07/20/17   Augusto Gamble B, NP  triamcinolone cream (KENALOG) 0.1 % Apply 1 application topically 2 (two) times daily.    [provider]    Family History  Family History  Problem Relation Age of Onset  . Hypertension Mother   . Hypertension Father   . Breast cancer Maternal Grandmother        53  . Colon cancer Neg Hx   . Colon polyps Neg Hx   . Kidney disease Neg Hx   . Diabetes Neg Hx   . Esophageal cancer Neg Hx   . Gallbladder disease Neg Hx   . Heart disease Neg Hx   . Asthma Neg Hx   . Cancer Neg Hx   . Stroke Neg Hx     Social History Social History   Tobacco Use  . Smoking status: Never Smoker  . Smokeless tobacco: Never Used  Substance Use Topics  . Alcohol use: No    Alcohol/week: 0.0 oz    Comment: occasional when not pregnant  . Drug use: No     Allergies   Patient has no known allergies.   Review of Systems Review of Systems   Physical Exam Triage Vital Signs ED Triage Vitals  Enc Vitals Group     BP 07/20/17 1327 116/71     Pulse Rate 07/20/17 1327 68      Resp --      Temp 07/20/17 1327 98.3 F (36.8 C)     Temp Source 07/20/17 1327 Oral     SpO2 07/20/17 1327 98 %     Weight --      Height --      Head Circumference --      Peak Flow --      Pain Score 07/20/17 1328 8     Pain Loc --      Pain Edu? --      Excl. in [place]? --    No data found.  Updated Vital Signs BP 116/71 (BP Location: Left Arm)   Pulse 68   Temp 98.3 F (36.8 C) (Oral)   LMP 07/15/2017 (Exact Date)   SpO2 98%   Visual Acuity Right Eye Distance:   Left Eye Distance:   Bilateral Distance:    Right Eye Near:   Left Eye Near:    Bilateral Near:     Physical Exam  Constitutional: She is oriented to person, place, and time. She appears well-developed and well-nourished. No distress.  Cardiovascular: Normal rate, regular rhythm and normal heart sounds.  Pulmonary/Chest: Effort normal and breath sounds normal.  Abdominal: Soft. She exhibits distension. Bowel sounds are increased. There is no hepatosplenomegaly, splenomegaly or hepatomegaly. There is generalized tenderness. There is no rigidity, no rebound, no guarding, no CVA tenderness, no tenderness at McBurney's point and negative Murphy's sign.  General abdominal pain on palpation but worse to upper abdomen; hyper bowel sounds noted with mild distention  Neurological: She is alert and oriented to person, place, and time.  Skin: Skin is warm and dry.     UC Treatments / Results  Labs (all labs ordered are listed, but only abnormal results are displayed) Labs Reviewed  POCT URINALYSIS DIP (DEVICE) - Abnormal; Notable for the following components:      Result Value   Ketones, ur TRACE (*)    All other components within normal limits  POCT PREGNANCY, URINE  POCT I-STAT, CHEM 8    EKG None  Radiology Dg Abd 2 Views  Result Date: 07/20/2017 CLINICAL DATA:  Abdominal pain with vomiting and constipation EXAM: ABDOMEN - 2 VIEW COMPARISON:  None. FINDINGS: Formed stool throughout the transverse and  descending colon. No small bowel  dilatation. No concerning mass effect or gas collection. IMPRESSION: Moderate stool volume.  No obstruction or rectal impaction. Electronically Signed   By: Monte Fantasia M.D.   On: 07/20/2017 14:40    Procedures Procedures (including critical care time)  Medications Ordered in UC Medications  gi cocktail (Maalox,Lidocaine,Donnatal) (30 mLs Oral Given 07/20/17 1431)  ondansetron (ZOFRAN-ODT) disintegrating tablet 4 mg (4 mg Oral Given 07/20/17 1431)    Initial Impression / Assessment and Plan / UC Course  I have reviewed the triage vital signs and the nursing notes.  Pertinent labs & imaging results that were available during my care of the patient were reviewed by me and considered in my medical decision making (see chart for details).     Concern for mild constipation as well as gerd contributing to what appears to be somewhat recurrent episodes of vomiting. Worse with eating and with upper abdominal pain. Patient endorses some constipation and moderate stool burden present on xray without presence of obvious obstruction. Miralax encouraged to empty stool, as well as daily zantac. Taking liquids without difficulty and without redflag findings on exam. Return precautions provided. Patient verbalized understanding and agreeable to plan.    Final Clinical Impressions(s) / UC Diagnoses   Final diagnoses:  Non-intractable vomiting with nausea, unspecified vomiting type  Abdominal pain, epigastric  Constipation, unspecified constipation type     Discharge Instructions     It is reassuring that you are taking liquids without vomiting. Your basic lab and urine are reassuring here today as well. Your xray is concerning for constipation which I wonder if is contributing to your symptoms. Please use miralax today and repeat daily to promote large and regular bowel movements. May use two doses if no BM by tomorrow morning. Continue to increase  fluids. Increase fiber, raw fruits and vegetables in your diet. Daily zantac. Zofran as needed. If develop blood in stool or vomit, worsening of pain, fevers or otherwise worsening please return or go to Er.     ED Prescriptions    Medication Sig Dispense Auth. Provider   polyethylene glycol powder (GLYCOLAX/MIRALAX) powder Take 17 g by mouth daily. 255 g Augusto Gamble B, NP   ranitidine (ZANTAC) 150 MG capsule Take 1 capsule (150 mg total) by mouth daily. 30 capsule Marquetta Weiskopf B, NP   ondansetron (ZOFRAN) 4 MG tablet Take 1 tablet (4 mg total) by mouth every 8 (eight) hours as needed for nausea or vomiting. 10 tablet Zigmund Gottron, NP     Controlled Substance Prescriptions [place] Controlled Substance Registry consulted? Not Applicable   Zigmund Gottron, NP 07/20/17 1515

## 2017-07-20 NOTE — Discharge Instructions (Signed)
It is reassuring that you are taking liquids without vomiting. Your basic lab and urine are reassuring here today as well. Your xray is concerning for constipation which I wonder if is contributing to your symptoms. Please use miralax today and repeat daily to promote large and regular bowel movements. May use two doses if no BM by tomorrow morning. Continue to increase fluids. Increase fiber, raw fruits and vegetables in your diet. Daily zantac. Zofran as needed. If develop blood in stool or vomit, worsening of pain, fevers or otherwise worsening please return or go to Er.

## 2017-07-21 ENCOUNTER — Other Ambulatory Visit: Payer: Self-pay

## 2017-07-21 ENCOUNTER — Emergency Department (HOSPITAL_COMMUNITY)
Admission: EM | Admit: 2017-07-21 | Discharge: 2017-07-21 | Disposition: A | Discharge status: Home / Self Care | Payer: Self-pay | Attending: Emergency Medicine | Admitting: Emergency Medicine

## 2017-07-21 ENCOUNTER — Encounter (HOSPITAL_COMMUNITY): Payer: Self-pay | Admitting: Emergency Medicine

## 2017-07-21 DIAGNOSIS — K5900 Constipation, unspecified: Secondary | ICD-10-CM | POA: Insufficient documentation

## 2017-07-21 DIAGNOSIS — R112 Nausea with vomiting, unspecified: Secondary | ICD-10-CM | POA: Insufficient documentation

## 2017-07-21 DIAGNOSIS — I10 Essential (primary) hypertension: Secondary | ICD-10-CM | POA: Insufficient documentation

## 2017-07-21 DIAGNOSIS — Z79899 Other long term (current) drug therapy: Secondary | ICD-10-CM | POA: Insufficient documentation

## 2017-07-21 LAB — POC OCCULT BLOOD, ED: Fecal Occult Bld: NEGATIVE

## 2017-07-21 MED ORDER — SORBITOL 70 % SOLN
960.0000 mL | TOPICAL_OIL | Freq: Once | ORAL | Status: AC
  Administered 2017-07-21: 960 mL via RECTAL
  Filled 2017-07-21: qty 473

## 2017-07-21 NOTE — Discharge Instructions (Addendum)
Please continue to take Miralax daily to help with constipation. Eat food high in fiber.  Use over the counter Fleet enema as needed for constipation.

## 2017-07-21 NOTE — ED Notes (Signed)
Pt requested to continue the enema, pt immediately used restroom, states she had small bowel movement

## 2017-07-21 NOTE — ED Provider Notes (Signed)
[facility] DEPT Provider Note   CSN: [id number] Arrival date & time: 07/21/17  1246     History   Chief Complaint Chief Complaint  Patient presents with  . Constipation    HPI Sherri [name] is a 32 y.o. female.  HPI   32 year old obese female with history of fibromyalgia, uterine fibroid, hypertension presenting for evaluation of constipation.  Patient mention for the past 4 days she has had persistent constipation unable to produce a bowel movement.  She endorsed lower abdominal cramping due to constipation.  She been feeling nauseous and has been vomiting up to 4 times since.  Vomitus is nonbloody nonbilious without feculent content.  No associated fever chills, dysuria, hematuria, vaginal bleeding or vaginal discharge.  Patient was last seen at urgent care center yesterday for the same complaint.  She was complaining of general fatigue, vomiting, abdominal pain for the past 3 4 days.  An abdominal x-ray was performed showing evidence of constipation but no evidence to suggest SBO.  An i-STAT Chem-8, as well as pregnancy test and urinalysis obtained without concerning feature.  Patient sent home with medication for GERD as well as MiraLAX.  She tries using medication but report no improvement, continues to endorse constipation.  No prior abdominal surgery.  No history of cancer.  Past Medical History:  Diagnosis Date  . Fibromyalgia    denies today  . Genital herpes   . Hypertension   . 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 Urinary tract infection   . Uterine fibroid     Patient Active Problem List   Diagnosis Date Noted  . Hypertension   . Genital herpes   . Yeast infection 12/03/2016  . Status post cesarean delivery 06/06/2014  . Uterine fibroid 12/25/2013  . Obesity 12/25/2013    Past Surgical History:  Procedure Laterality Date  . CESAREAN SECTION N/A 06/05/2014   Procedure: CESAREAN SECTION;  Surgeon: Truett Mainland, DO;  Location: [facility];  Service: Obstetrics;  Laterality: N/A;  . WISDOM TOOTH EXTRACTION       OB History    Gravida  1   Para  1   Term      Preterm  1   AB      Living  2     SAB      TAB      Ectopic      Multiple  1   Live Births  2            Home Medications    Prior to Admission medications   Medication Sig Start Date End Date Taking? Authorizing Provider  fluticasone (FLONASE) 50 MCG/ACT nasal spray Place 2 sprays into both nostrils daily. Patient taking differently: Place 2 sprays into both nostrils daily as needed (allergies; rhinitis.).  06/22/17  Yes Wieters, Hallie C, PA-C  ibuprofen (ADVIL,MOTRIN) 400 MG tablet Take 1 tablet (400 mg total) by mouth every 6 (six) hours as needed. 06/25/17  Yes Burky, Lanelle Bal B, NP  ondansetron (ZOFRAN) 4 MG tablet Take 1 tablet (4 mg total) by mouth every 8 (eight) hours as needed for nausea or vomiting. 07/20/17  Yes Burky, Natalie B, NP  polyethylene glycol powder (GLYCOLAX/MIRALAX) powder Take 17 g by mouth daily. 07/20/17  Yes Augusto Gamble B, NP  ranitidine (ZANTAC) 150 MG capsule Take 1 capsule (150 mg total) by mouth daily. 07/20/17  Yes Zigmund Gottron, NP  Vitamin D, Ergocalciferol, (DRISDOL) 50000 units  CAPS capsule Take 50,000 Units by mouth once a week. 07/16/17  Yes [provider]  chlorhexidine (PERIDEX) 0.12 % solution Use as directed 15 mLs in the mouth or throat 2 (two) times daily. Patient not taking: Reported on 07/21/2017 06/29/17   Robyn Haber, MD    Family History Family History  Problem Relation Age of Onset  . Hypertension Mother   . Hypertension Father   . Breast cancer Maternal Grandmother        79  . Colon cancer Neg Hx   . Colon polyps Neg Hx   . Kidney disease Neg Hx   . Diabetes Neg Hx   . Esophageal cancer Neg Hx   . Gallbladder disease Neg Hx   . Heart disease Neg Hx   . Asthma Neg Hx   . Cancer Neg Hx   . Stroke Neg Hx     Social History Social  History   Tobacco Use  . Smoking status: Never Smoker  . Smokeless tobacco: Never Used  Substance Use Topics  . Alcohol use: No    Alcohol/week: 0.0 oz    Comment: occasional when not pregnant  . Drug use: No     Allergies   Patient has no known allergies.   Review of Systems Review of Systems  All other systems reviewed and are negative.    Physical Exam Updated Vital Signs BP 131/84 (BP Location: Right Arm)   Pulse (!) 58   Temp 97.9 F (36.6 C) (Oral)   Resp 17   LMP 07/15/2017 (Exact Date)   SpO2 100%   Physical Exam  Constitutional: She appears well-developed and well-nourished. No distress.  Obese female resting comfortably in no acute discomfort.  HENT:  Head: Atraumatic.  Eyes: Conjunctivae are normal.  Neck: Neck supple.  Cardiovascular: Normal rate and regular rhythm.  Pulmonary/Chest: Effort normal and breath sounds normal.  Abdominal: Soft. Bowel sounds are normal. She exhibits no distension. There is tenderness (Mild tenderness to lower abdomen on palpation without guarding or rebound tenderness.  Negative Murphy sign, no pain at McBurney's point.).  Genitourinary:  Genitourinary Comments: Chaperone present during exam.  Normal rectal tone, no impacted stool, no mass, normal stool color on glove.  Neurological: She is alert.  Skin: No rash noted.  Psychiatric: She has a normal mood and affect.  Nursing note and vitals reviewed.    ED Treatments / Results  Labs (all labs ordered are listed, but only abnormal results are displayed) Labs Reviewed  POC OCCULT BLOOD, ED    EKG None  Radiology Dg Abd 2 Views  Result Date: 07/20/2017 CLINICAL DATA:  Abdominal pain with vomiting and constipation EXAM: ABDOMEN - 2 VIEW COMPARISON:  None. FINDINGS: Formed stool throughout the transverse and descending colon. No small bowel dilatation. No concerning mass effect or gas collection. IMPRESSION: Moderate stool volume.  No obstruction or rectal impaction.  Electronically Signed   By: Monte Fantasia M.D.   On: 07/20/2017 14:40    Procedures Procedures (including critical care time)  Medications Ordered in ED Medications  sorbitol, milk of mag, mineral oil, glycerin (SMOG) enema (960 mLs Rectal Given 07/21/17 1814)     Initial Impression / Assessment and Plan / ED Course  I have reviewed the triage vital signs and the nursing notes.  Pertinent labs & imaging results that were available during my care of the patient were reviewed by me and considered in my medical decision making (see chart for details).  BP 131/84 (BP Location: Right Arm)   Pulse (!) 58   Temp 97.9 F (36.6 C) (Oral)   Resp 17   LMP 07/15/2017 (Exact Date)   SpO2 100%    Final Clinical Impressions(s) / ED Diagnoses   Final diagnoses:  Constipation, unspecified constipation type    ED Discharge Orders    None     10:19 PM Patient with constipation for the past 3 to 4 days unable to produce a bowel movement but able to pass flatus.  No evidence of such as small bowel obstruction.  We discussed options to help with the constipation.  Patient prefers enema.  We will give smog enema.  10:21 PM Successful treatment of constipation using SMOG enema.  Recommend continue with Miralax as previously prescribed.  Also recommend using Fleet enema as needed and eat food high in fiber.  Return precaution discussed.  Pt stable for discharge.    Domenic Moras, PA-C 07/21/17 2222    Valarie Merino, MD 07/21/17 [phone]

## 2017-07-21 NOTE — ED Notes (Signed)
Patient went to the restroom and successfully had a bowel movement.

## 2017-07-21 NOTE — ED Notes (Signed)
Pt continued enema, went into restroom to have bowel movement

## 2017-07-21 NOTE — ED Triage Notes (Signed)
Pt complaint of constipation since last Thursday; seen at [facility] yesterday for same; "they gave me something but it didn't work."

## 2017-07-23 ENCOUNTER — Ambulatory Visit (HOSPITAL_COMMUNITY)
Admission: EM | Admit: 2017-07-23 | Discharge: 2017-07-23 | Disposition: A | Discharge status: Home / Self Care | Payer: Self-pay | Attending: Internal Medicine | Admitting: Internal Medicine

## 2017-07-23 ENCOUNTER — Encounter (HOSPITAL_COMMUNITY): Payer: Self-pay | Admitting: Emergency Medicine

## 2017-07-23 ENCOUNTER — Ambulatory Visit (INDEPENDENT_AMBULATORY_CARE_PROVIDER_SITE_OTHER): Payer: Self-pay

## 2017-07-23 DIAGNOSIS — R1030 Lower abdominal pain, unspecified: Secondary | ICD-10-CM

## 2017-07-23 DIAGNOSIS — K5900 Constipation, unspecified: Secondary | ICD-10-CM

## 2017-07-23 MED ORDER — KETOROLAC TROMETHAMINE 60 MG/2ML IM SOLN
INTRAMUSCULAR | Status: AC
  Filled 2017-07-23: qty 2

## 2017-07-23 MED ORDER — KETOROLAC TROMETHAMINE 60 MG/2ML IM SOLN
60.0000 mg | Freq: Once | INTRAMUSCULAR | Status: AC
  Administered 2017-07-23: 60 mg via INTRAMUSCULAR

## 2017-07-23 NOTE — Discharge Instructions (Addendum)
No danger signs on exam today.  Xray at the urgent care today was improved from 5/21.  Since you are still having hard/painful bowel movements would increase MiraLAX to twice daily, and continue taking this until you are having a soft bowel movement without pain every day.  Prune juice is fine to take.  Ex-Lax may increase abdominal cramping and discomfort.  Anticipate gradual improvement in abdominal discomfort over the next several days.  Go to the emergency room for severe/sustained (>1 hr) pain, new fever greater than 100.5, bloody vomit, or if unable to keep down liquids.

## 2017-07-23 NOTE — ED Provider Notes (Signed)
[facility]    CSN: [id number] Arrival date & time: 07/23/17  1350     History   Chief Complaint Chief Complaint  Patient presents with  . Abdominal Pain    HPI BRYANNE [name] is a 32 y.o. female.   She presents today with persistent abdominal discomfort, diffuse, with vomiting after painful bowel movements.  She has been evaluated on May 21 at the urgent care and on May 22 at the emergency room with findings suggestive of constipation.  She has taken MiraLAX once daily and some Ex-Lax, is having small bowel movements but still hard/painful.  No fever.  No unusual vaginal discharge or bleeding.  Some urinary frequency but no dysuria.    HPI  Past Medical History:  Diagnosis Date  . Fibromyalgia    denies today  . Genital herpes   . Hypertension   . 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 Urinary tract infection   . Uterine fibroid     Patient Active Problem List   Diagnosis Date Noted  . Hypertension   . Genital herpes   . Yeast infection 12/03/2016  . Status post cesarean delivery 06/06/2014  . Uterine fibroid 12/25/2013  . Obesity 12/25/2013    Past Surgical History:  Procedure Laterality Date  . CESAREAN SECTION N/A 06/05/2014   Procedure: CESAREAN SECTION;  Surgeon: Truett Mainland, DO;  Location: [facility];  Service: Obstetrics;  Laterality: N/A;  . WISDOM TOOTH EXTRACTION      OB History    Gravida  1   Para  1   Term      Preterm  1   AB      Living  2     SAB      TAB      Ectopic      Multiple  1   Live Births  2            Home Medications    Prior to Admission medications   Medication Sig Start Date End Date Taking? Authorizing Provider  fluticasone (FLONASE) 50 MCG/ACT nasal spray Place 2 sprays into both nostrils daily. Patient taking differently: Place 2 sprays into both nostrils daily as needed (allergies; rhinitis.).  06/22/17   Wieters, Hallie C, PA-C  ibuprofen (ADVIL,MOTRIN) 400 MG  tablet Take 1 tablet (400 mg total) by mouth every 6 (six) hours as needed. 06/25/17   Zigmund Gottron, NP  ondansetron (ZOFRAN) 4 MG tablet Take 1 tablet (4 mg total) by mouth every 8 (eight) hours as needed for nausea or vomiting. 07/20/17   Augusto Gamble B, NP  polyethylene glycol powder (GLYCOLAX/MIRALAX) powder Take 17 g by mouth daily. 07/20/17   Zigmund Gottron, NP  ranitidine (ZANTAC) 150 MG capsule Take 1 capsule (150 mg total) by mouth daily. 07/20/17   Zigmund Gottron, NP  Vitamin D, Ergocalciferol, (DRISDOL) 50000 units CAPS capsule Take 50,000 Units by mouth once a week. 07/16/17   [provider]    Family History Family History  Problem Relation Age of Onset  . Hypertension Mother   . Hypertension Father   . Breast cancer Maternal Grandmother        33  . Colon cancer Neg Hx   . Colon polyps Neg Hx   . Kidney disease Neg Hx   . Diabetes Neg Hx   . Esophageal cancer Neg Hx   . Gallbladder disease Neg Hx   . Heart disease Neg Hx   .  Asthma Neg Hx   . Cancer Neg Hx   . Stroke Neg Hx     Social History Social History   Tobacco Use  . Smoking status: Never Smoker  . Smokeless tobacco: Never Used  Substance Use Topics  . Alcohol use: No    Alcohol/week: 0.0 oz    Comment: occasional when not pregnant  . Drug use: No     Allergies   Patient has no known allergies.   Review of Systems Review of Systems  All other systems reviewed and are negative.    Physical Exam Triage Vital Signs ED Triage Vitals [07/23/17 1405]  Enc Vitals Group     BP (!) 143/73     Pulse Rate 70     Resp 16     Temp 98 F (36.7 C)     Temp src      SpO2 99 %     Weight      Height      Pain Score      Pain Loc    Updated Vital Signs BP (!) 143/73   Pulse 70   Temp 98 F (36.7 C)   Resp 16   LMP 07/15/2017 (Exact Date)   SpO2 99%   Physical Exam  Constitutional: She is oriented to person, place, and time. No distress.  Sitting up on the end of the exam  table, relaxed posture  HENT:  Head: Atraumatic.  Eyes:  Conjugate gaze observed, no eye redness/discharge  Neck: Neck supple.  Cardiovascular: Normal rate and regular rhythm.  Pulmonary/Chest: No respiratory distress. She has no wheezes. She has no rales.  Lungs clear, symmetric breath sounds   Abdominal: Soft. She exhibits no distension. There is no rebound and no guarding.  Diffuse mild tenderness to deep palpation, may be a little more pronounced in the epigastrium and in the left lower quadrant.  I am able to palpate deeply, she does not guard.  Musculoskeletal: Normal range of motion.  Neurological: She is alert and oriented to person, place, and time.  Skin: Skin is warm and dry.  Nursing note and vitals reviewed.    UC Treatments / Results  Labs (all labs ordered are listed, but only abnormal results are displayed) Labs Reviewed - No data to display  EKG None  Radiology  DG ABDOMEN ACUTE W/ 1V CHEST  COMPARISON: 03/20/2017, 07/20/2017  FINDINGS: There is no evidence of dilated bowel loops or free intraperitoneal air. No radiopaque calculi or other significant radiographic abnormality is seen. Heart size and mediastinal contours are within normal limits. Both lungs are clear. Mild scoliosis of the lumbar spine is noted concave to the right stable from previous exams.  IMPRESSION: No acute abnormality noted.   Electronically Signed By: Inez Catalina M.D. On: 07/23/2017 14:55  Procedures Procedures (including critical care time)  Medications Ordered in UC Medications  ketorolac (TORADOL) injection 60 mg (60 mg Intramuscular Given 07/23/17 1605)     Final Clinical Impressions(s) / UC Diagnoses   Final diagnoses:  Lower abdominal pain  Constipation, unspecified constipation type     Discharge Instructions     No danger signs on exam today.  Xray at the urgent care today was improved from 5/21.  Since you are still having hard/painful bowel  movements would increase MiraLAX to twice daily, and continue taking this until you are having a soft bowel movement without pain every day.  Prune juice is fine to take.  Ex-Lax may increase abdominal cramping and  discomfort.  Anticipate gradual improvement in abdominal discomfort over the next several days.  Go to the emergency room for severe/sustained (>1 hr) pain, new fever greater than 100.5, bloody vomit, or if unable to keep down liquids.       Wynona Luna, MD 07/28/17 2114

## 2017-07-23 NOTE — ED Triage Notes (Signed)
Pt c/o generalized abdominal pain, with nausea and vomiting.

## 2017-08-05 ENCOUNTER — Ambulatory Visit (HOSPITAL_COMMUNITY)
Admission: EM | Admit: 2017-08-05 | Discharge: 2017-08-05 | Disposition: A | Discharge status: Home / Self Care | Payer: Self-pay | Attending: Family Medicine | Admitting: Family Medicine

## 2017-08-05 ENCOUNTER — Other Ambulatory Visit: Payer: Self-pay

## 2017-08-05 ENCOUNTER — Encounter (HOSPITAL_COMMUNITY): Payer: Self-pay | Admitting: Emergency Medicine

## 2017-08-05 DIAGNOSIS — J302 Other seasonal allergic rhinitis: Secondary | ICD-10-CM

## 2017-08-05 MED ORDER — FLUTICASONE PROPIONATE 50 MCG/ACT NA SUSP: 2.0000 | Freq: Every day | NASAL | 0 refills | Status: DC

## 2017-08-05 MED ORDER — IPRATROPIUM BROMIDE 0.06 % NA SOLN: 2.0000 | Freq: Four times a day (QID) | NASAL | 0 refills | Status: DC

## 2017-08-05 MED ORDER — MONTELUKAST SODIUM 10 MG PO TABS: 10.0000 mg | ORAL_TABLET | Freq: Every day | ORAL | 1 refills | Status: DC

## 2017-08-05 NOTE — Discharge Instructions (Signed)
Continue Claritin.  Start Flonase and Atrovent nasal spray to help with nasal drainage/congestion.  Flonase will also help with your ear pain for possible eustachian tube dysfunction.  I am adding on Singulair, this is another medicine sent for allergies, this is to take with your Claritin. Keep hydrated, your urine should be clear to pale yellow in color. Monitor for any worsening of symptoms, chest pain, shortness of breath, wheezing, swelling of the throat, follow up for reevaluation.   I have attached information for allergist, he can follow-up for allergy testing.  For sore throat try using a honey-based tea. Use 3 teaspoons of honey with juice squeezed from half lemon. Place shaved pieces of ginger into 1/2-1 cup of water and warm over stove top. Then mix the ingredients and repeat every 4 hours as needed.

## 2017-08-05 NOTE — ED Triage Notes (Signed)
Runny nose, sore throat, and ear pain x 3 days .

## 2017-08-05 NOTE — ED Provider Notes (Signed)
[facility]    CSN: [id number] Arrival date & time: 08/05/17  1630     History   Chief Complaint Chief Complaint  Patient presents with  . URI    HPI Sherri [name] is a 32 y.o. female.   32 year old female comes in for 3-day history of rhinorrhea, sore throat, ear pain.  States has also been sneezing, coughing, watery itchy eyes.  Denies fever, chills, night sweats.  Denies abdominal pain, nausea, vomiting.  States that she started taking Claritin for the past week with little relief.  States she has tried intermittently with Flonase without significant relief. She does have history of seasonal allergies.  Never smoker.      Past Medical History:  Diagnosis Date  . Fibromyalgia    denies today  . Genital herpes   . Hypertension   . 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 Urinary tract infection   . Uterine fibroid     Patient Active Problem List   Diagnosis Date Noted  . Hypertension   . Genital herpes   . Yeast infection 12/03/2016  . Status post cesarean delivery 06/06/2014  . Uterine fibroid 12/25/2013  . Obesity 12/25/2013    Past Surgical History:  Procedure Laterality Date  . CESAREAN SECTION N/A 06/05/2014   Procedure: CESAREAN SECTION;  Surgeon: Truett Mainland, DO;  Location: [facility];  Service: Obstetrics;  Laterality: N/A;  . WISDOM TOOTH EXTRACTION      OB History    Gravida  1   Para  1   Term      Preterm  1   AB      Living  2     SAB      TAB      Ectopic      Multiple  1   Live Births  2            Home Medications    Prior to Admission medications   Medication Sig Start Date End Date Taking? Authorizing Provider  fluticasone (FLONASE) 50 MCG/ACT nasal spray Place 2 sprays into both nostrils daily. 08/05/17   Tasia Catchings, Amy V, PA-C  ibuprofen (ADVIL,MOTRIN) 400 MG tablet Take 1 tablet (400 mg total) by mouth every 6 (six) hours as needed. 06/25/17   Augusto Gamble B, NP  ipratropium  (ATROVENT) 0.06 % nasal spray Place 2 sprays into both nostrils 4 (four) times daily. 08/05/17   Tasia Catchings, Amy V, PA-C  montelukast (SINGULAIR) 10 MG tablet Take 1 tablet (10 mg total) by mouth at bedtime. 08/05/17   Ok Edwards, PA-C    Family History Family History  Problem Relation Age of Onset  . Hypertension Mother   . Hypertension Father   . Breast cancer Maternal Grandmother        75  . Colon cancer Neg Hx   . Colon polyps Neg Hx   . Kidney disease Neg Hx   . Diabetes Neg Hx   . Esophageal cancer Neg Hx   . Gallbladder disease Neg Hx   . Heart disease Neg Hx   . Asthma Neg Hx   . Cancer Neg Hx   . Stroke Neg Hx     Social History Social History   Tobacco Use  . Smoking status: Never Smoker  . Smokeless tobacco: Never Used  Substance Use Topics  . Alcohol use: No    Alcohol/week: 0.0 oz    Comment: occasional when not pregnant  . Drug  use: No     Allergies   Patient has no known allergies.   Review of Systems Review of Systems  Reason unable to perform ROS: See HPI as above.     Physical Exam Triage Vital Signs ED Triage Vitals  Enc Vitals Group     BP 08/05/17 1701 130/89     Pulse Rate 08/05/17 1701 98     Resp 08/05/17 1701 18     Temp 08/05/17 1701 98.5 F (36.9 C)     Temp Source 08/05/17 1701 Oral     SpO2 08/05/17 1701 96 %     Weight --      Height --      Head Circumference --      Peak Flow --      Pain Score 08/05/17 1658 9     Pain Loc --      Pain Edu? --      Excl. in [place]? --    No data found.  Updated Vital Signs BP 130/89 (BP Location: Right Arm)   Pulse 98   Temp 98.5 F (36.9 C) (Oral)   Resp 18   LMP 07/15/2017 (Exact Date)   SpO2 96%   Physical Exam  Constitutional: She is oriented to person, place, and time. She appears well-developed and well-nourished. No distress.  HENT:  Head: Normocephalic and atraumatic.  Right Ear: Tympanic membrane, external ear and ear canal normal. Tympanic membrane is not erythematous and not  bulging.  Left Ear: Tympanic membrane, external ear and ear canal normal. Tympanic membrane is not erythematous and not bulging.  Nose: Mucosal edema present. Right sinus exhibits maxillary sinus tenderness. Right sinus exhibits no frontal sinus tenderness. Left sinus exhibits maxillary sinus tenderness. Left sinus exhibits no frontal sinus tenderness.  Mouth/Throat: Uvula is midline, oropharynx is clear and moist and mucous membranes are normal.  Eyes: Pupils are equal, round, and reactive to light. Conjunctivae are normal.  Neck: Normal range of motion. Neck supple.  Cardiovascular: Normal rate, regular rhythm and normal heart sounds. Exam reveals no gallop and no friction rub.  No murmur heard. Pulmonary/Chest: Effort normal and breath sounds normal. She has no decreased breath sounds. She has no wheezes. She has no rhonchi. She has no rales.  Lymphadenopathy:    She has no cervical adenopathy.  Neurological: She is alert and oriented to person, place, and time.  Skin: Skin is warm and dry. She is not diaphoretic.  Psychiatric: She has a normal mood and affect. Her behavior is normal. Judgment normal.    UC Treatments / Results  Labs (all labs ordered are listed, but only abnormal results are displayed) Labs Reviewed - No data to display  EKG None  Radiology No results found.  Procedures Procedures (including critical care time)  Medications Ordered in UC Medications - No data to display  Initial Impression / Assessment and Plan / UC Course  I have reviewed the triage vital signs and the nursing notes.  Pertinent labs & imaging results that were available during my care of the patient were reviewed by me and considered in my medical decision making (see chart for details).    Discussed possible allergic rhinitis causing symptoms.  Will add on Singulair to Claritin.  Other symptomatic treatment discussed.  Patient requesting information on allergist, resources provided.   Return precautions given.  Patient expresses understanding and agrees to plan.  Final Clinical Impressions(s) / UC Diagnoses   Final diagnoses:  Seasonal allergic rhinitis, unspecified trigger  ED Prescriptions    Medication Sig Dispense Auth. Provider   fluticasone (FLONASE) 50 MCG/ACT nasal spray Place 2 sprays into both nostrils daily. 1 g Yu, Amy V, PA-C   ipratropium (ATROVENT) 0.06 % nasal spray Place 2 sprays into both nostrils 4 (four) times daily. 15 mL Yu, Amy V, PA-C   montelukast (SINGULAIR) 10 MG tablet Take 1 tablet (10 mg total) by mouth at bedtime. 30 tablet Tobin Chad, [place] 08/05/17 1724

## 2017-09-06 ENCOUNTER — Other Ambulatory Visit: Payer: Self-pay

## 2017-09-06 ENCOUNTER — Ambulatory Visit (HOSPITAL_COMMUNITY)
Admission: EM | Admit: 2017-09-06 | Discharge: 2017-09-06 | Disposition: A | Discharge status: Home / Self Care | Payer: Self-pay | Attending: Family Medicine | Admitting: Family Medicine

## 2017-09-06 ENCOUNTER — Encounter (HOSPITAL_COMMUNITY): Payer: Self-pay | Admitting: *Deleted

## 2017-09-06 DIAGNOSIS — L259 Unspecified contact dermatitis, unspecified cause: Secondary | ICD-10-CM

## 2017-09-06 MED ORDER — TRIAMCINOLONE ACETONIDE 0.1 % EX CREA: 1.0000 "application " | TOPICAL_CREAM | Freq: Two times a day (BID) | CUTANEOUS | 0 refills | Status: DC

## 2017-09-06 NOTE — ED Provider Notes (Signed)
[place]   [id number] 09/06/17 Arrival Time: [address] PLAN:  1. Contact dermatitis, unspecified contact dermatitis type, unspecified trigger     Meds ordered this encounter  Medications  . triamcinolone cream (KENALOG) 0.1 %    Sig: Apply 1 application topically 2 (two) times daily.    Dispense:  15 g    Refill:  0   Follow-up [place].   Specialty:  Urgent Care Why:  If not seeing improvement over the next 3-4 days. Contact information: [place] [phone]         Reviewed expectations re: course of current medical issues. Questions answered. Outlined signs and symptoms indicating need for more acute intervention. Patient verbalized understanding. After Visit Summary given.   SUBJECTIVE:  Sherri [name] is a 32 y.o. female who presents with a skin complaint.   Location: L inner forearm Onset: abrupt Duration: 2-3 days Pruritic? Yes Painful? No Progression: stable  Drainage? No  Known trigger? No  New soaps/lotions/topicals/detergents? No Environmental exposures or allergies? None; but works as a Retail buyer. Contacts with similar? No Recent travel? No  Other associated symptoms: none Therapies tried thus far: none Denies fever. No specific aggravating or alleviating factors reported.  ROS: As per HPI.  OBJECTIVE: Vitals:   09/06/17 1216  BP: 134/85  Pulse: 68  Resp: 16  Temp: 98.3 F (36.8 C)  TempSrc: Oral  SpO2: 100%    General appearance: alert; no distress Lungs: clear to auscultation bilaterally Heart: regular rate and rhythm Extremities: no edema Skin: warm and dry; two distinct circular areas of erythema each approx 0.5 cm in diameter; slightly raised; non-tender Psychological: alert and cooperative; normal mood and affect  No Known Allergies  Past Medical History:  Diagnosis Date  . Fibromyalgia    denies today    . Genital herpes   . Hypertension   . 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 Urinary tract infection   . Uterine fibroid    Social History   Socioeconomic History  . Marital status: Single    Spouse name: Not on file  . Number of children: Not on file  . Years of education: Not on file  . Highest education level: Not on file  Occupational History  . Occupation: [employer]  . Financial resource strain: Not on file  . Food insecurity:    Worry: Not on file    Inability: Not on file  . Transportation needs:    Medical: Not on file    Non-medical: Not on file  Tobacco Use  . Smoking status: Never Smoker  . Smokeless tobacco: Never Used  Substance and Sexual Activity  . Alcohol use: No    Alcohol/week: 0.0 oz    Comment: occasional when not pregnant  . Drug use: No  . Sexual activity: Yes    Birth control/protection: Condom  Lifestyle  . Physical activity:    Days per week: Not on file    Minutes per session: Not on file  . Stress: Not on file  Relationships  . Social connections:    Talks on phone: Not on file    Gets together: Not on file    Attends religious service: Not on file    Active member of club or organization: Not on file    Attends meetings of clubs or organizations: Not on file  Relationship status: Not on file  . Intimate partner violence:    Fear of current or ex partner: Not on file    Emotionally abused: Not on file    Physically abused: Not on file    Forced sexual activity: Not on file  Other Topics Concern  . Not on file  Social History Narrative  . Not on file   Family History  Problem Relation Age of Onset  . Hypertension Mother   . Hypertension Father   . Breast cancer Maternal Grandmother        63  . Colon cancer Neg Hx   . Colon polyps Neg Hx   . Kidney disease Neg Hx   . Diabetes Neg Hx   . Esophageal cancer Neg Hx   . Gallbladder disease Neg Hx   . Heart disease Neg Hx   . Asthma  Neg Hx   . Cancer Neg Hx   . Stroke Neg Hx    Past Surgical History:  Procedure Laterality Date  . CESAREAN SECTION N/A 06/05/2014   Procedure: CESAREAN SECTION;  Surgeon: Truett Mainland, DO;  Location: [facility];  Service: Obstetrics;  Laterality: N/A;  . WISDOM TOOTH EXTRACTION       Vanessa Kick, MD 09/06/17 1252

## 2017-09-06 NOTE — ED Triage Notes (Signed)
C/o rash on left forearm onset Sat.

## 2017-10-20 ENCOUNTER — Encounter (HOSPITAL_COMMUNITY): Payer: Self-pay | Admitting: Emergency Medicine

## 2017-10-20 ENCOUNTER — Ambulatory Visit (HOSPITAL_COMMUNITY)
Admission: EM | Admit: 2017-10-20 | Discharge: 2017-10-20 | Disposition: A | Discharge status: Home / Self Care | Payer: Self-pay | Attending: Emergency Medicine | Admitting: Emergency Medicine

## 2017-10-20 ENCOUNTER — Other Ambulatory Visit: Payer: Self-pay

## 2017-10-20 DIAGNOSIS — L2082 Flexural eczema: Secondary | ICD-10-CM

## 2017-10-20 MED ORDER — NAPROXEN 375 MG PO TABS: 375.0000 mg | ORAL_TABLET | Freq: Two times a day (BID) | ORAL | 0 refills | Status: DC | PRN

## 2017-10-20 MED ORDER — TRIAMCINOLONE ACETONIDE 0.1 % EX CREA: 1.0000 "application " | TOPICAL_CREAM | Freq: Two times a day (BID) | CUTANEOUS | 0 refills | Status: DC

## 2017-10-20 NOTE — Discharge Instructions (Addendum)
Triamcinolone 0.1% cream prescribed. Use as directed Naproxen prescribed for pain.  Take as directed and to completion Take lukewarm showers or baths.  Avoid hot showers as this may make your condition worse Moisturize skin daily  Follow up with PCP, or with [facility] if symptoms persists Return or go to the ER if you have any new or worsening symptoms

## 2017-10-20 NOTE — ED Provider Notes (Signed)
[facility]   [id number] 10/20/17 Arrival Time: 0802  CC: SKIN COMPLAINT  SUBJECTIVE:  Sherri [name] is a 32 y.o. female who presents with a rash that began 3 days ago.  Denies changes in soaps, detergents, or anyone with similar symptoms.  Denies known trigger, environmental exposure or allergies, or recent travel.  Localizes the rash to antecubital areas.  Describes it as painful and itchy.  Has tried cold compresses and ibuprofen without relief.  Symptoms are made worse with elbow flexion and extension.  Denies similar symptoms in the past.   Denies fever, chills, nausea, vomiting, erythema, SOB, chest pain, abdominal pain, changes in bowel or bladder function.    ROS: As per HPI.  Past Medical History:  Diagnosis Date  . Fibromyalgia    denies today  . Genital herpes   . Hypertension   . 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 Urinary tract infection   . Uterine fibroid    Past Surgical History:  Procedure Laterality Date  . CESAREAN SECTION N/A 06/05/2014   Procedure: CESAREAN SECTION;  Surgeon: Truett Mainland, DO;  Location: [facility];  Service: Obstetrics;  Laterality: N/A;  . WISDOM TOOTH EXTRACTION     No Known Allergies No current facility-administered medications on file prior to encounter.    Current Outpatient Medications on File Prior to Encounter  Medication Sig Dispense Refill  . fluticasone (FLONASE) 50 MCG/ACT nasal spray Place 2 sprays into both nostrils daily. 1 g 0  . ibuprofen (ADVIL,MOTRIN) 400 MG tablet Take 1 tablet (400 mg total) by mouth every 6 (six) hours as needed. 30 tablet 0  . ipratropium (ATROVENT) 0.06 % nasal spray Place 2 sprays into both nostrils 4 (four) times daily. 15 mL 0  . montelukast (SINGULAIR) 10 MG tablet Take 1 tablet (10 mg total) by mouth at bedtime. 30 tablet 1   Social History   Socioeconomic History  . Marital status: Single    Spouse name: Not on file  . Number of children: Not on file    . Years of education: Not on file  . Highest education level: Not on file  Occupational History  . Occupation: [employer]  . Financial resource strain: Not on file  . Food insecurity:    Worry: Not on file    Inability: Not on file  . Transportation needs:    Medical: Not on file    Non-medical: Not on file  Tobacco Use  . Smoking status: Never Smoker  . Smokeless tobacco: Never Used  Substance and Sexual Activity  . Alcohol use: No    Alcohol/week: 0.0 standard drinks    Comment: occasional when not pregnant  . Drug use: No  . Sexual activity: Yes    Birth control/protection: Condom  Lifestyle  . Physical activity:    Days per week: Not on file    Minutes per session: Not on file  . Stress: Not on file  Relationships  . Social connections:    Talks on phone: Not on file    Gets together: Not on file    Attends religious service: Not on file    Active member of club or organization: Not on file    Attends meetings of clubs or organizations: Not on file    Relationship status: Not on file  . Intimate partner violence:    Fear of current or ex partner: Not on file    Emotionally abused: Not on file  Physically abused: Not on file    Forced sexual activity: Not on file  Other Topics Concern  . Not on file  Social History Narrative  . Not on file   Family History  Problem Relation Age of Onset  . Hypertension Mother   . Hypertension Father   . Breast cancer Maternal Grandmother        54  . Colon cancer Neg Hx   . Colon polyps Neg Hx   . Kidney disease Neg Hx   . Diabetes Neg Hx   . Esophageal cancer Neg Hx   . Gallbladder disease Neg Hx   . Heart disease Neg Hx   . Asthma Neg Hx   . Cancer Neg Hx   . Stroke Neg Hx     OBJECTIVE: Vitals:   10/20/17 0820  BP: 126/87  Pulse: 82  Resp: 16  Temp: 98.2 F (36.8 C)  TempSrc: Oral  SpO2: 100%    General appearance: alert; no distress Lungs: clear to auscultation bilaterally Heart:  regular rate and rhythm.  Radial pulse 2+ bilaterally Extremities: no edema Skin: warm and dry; fine maculopapular rash localized to bilateral antecubital regions; mild erythema; no active drainage; mildly tender to palpation Psychological: alert and cooperative; normal mood and affect  ASSESSMENT & PLAN:  1. Flexural eczema     Meds ordered this encounter  Medications  . triamcinolone cream (KENALOG) 0.1 %    Sig: Apply 1 application topically 2 (two) times daily.    Dispense:  28.4 g    Refill:  0    Order Specific Question:   Supervising Provider    Answer:   Wynona Luna [[id number]]  . naproxen (NAPROSYN) 375 MG tablet    Sig: Take 1 tablet (375 mg total) by mouth 2 (two) times daily as needed for moderate pain.    Dispense:  20 tablet    Refill:  0    Order Specific Question:   Supervising Provider    Answer:   Wynona Luna [[id number]]   Triamcinolone 0.1% cream prescribed. Use as directed Naproxen prescribed for pain.  Take as directed and to completion Take lukewarm showers or baths.  Avoid hot showers as this may make your condition worse Moisturize skin daily  Follow up with PCP, or with [facility] if symptoms persists Return or go to the ER if you have any new or worsening symptoms  Reviewed expectations re: course of current medical issues. Questions answered. Outlined signs and symptoms indicating need for more acute intervention. Patient verbalized understanding. After Visit Summary given.   Stacey Drain Waka, PA-C 10/20/17 [phone]

## 2017-10-20 NOTE — ED Triage Notes (Signed)
Patient points to right antecubital area as location of a rash and reports throbbing pain.  Patient reports left antecubital is also showing signs of rash and is painful.

## 2017-10-31 ENCOUNTER — Encounter (HOSPITAL_COMMUNITY): Payer: Self-pay | Admitting: Emergency Medicine

## 2017-10-31 ENCOUNTER — Emergency Department (HOSPITAL_COMMUNITY)
Admission: EM | Admit: 2017-10-31 | Discharge: 2017-10-31 | Disposition: A | Discharge status: Home / Self Care | Payer: Self-pay | Attending: Emergency Medicine | Admitting: Emergency Medicine

## 2017-10-31 ENCOUNTER — Other Ambulatory Visit: Payer: Self-pay

## 2017-10-31 DIAGNOSIS — R350 Frequency of micturition: Secondary | ICD-10-CM | POA: Insufficient documentation

## 2017-10-31 DIAGNOSIS — M62838 Other muscle spasm: Secondary | ICD-10-CM | POA: Insufficient documentation

## 2017-10-31 DIAGNOSIS — I10 Essential (primary) hypertension: Secondary | ICD-10-CM | POA: Insufficient documentation

## 2017-10-31 DIAGNOSIS — R112 Nausea with vomiting, unspecified: Secondary | ICD-10-CM | POA: Insufficient documentation

## 2017-10-31 DIAGNOSIS — R3915 Urgency of urination: Secondary | ICD-10-CM | POA: Insufficient documentation

## 2017-10-31 DIAGNOSIS — M791 Myalgia, unspecified site: Secondary | ICD-10-CM | POA: Insufficient documentation

## 2017-10-31 DIAGNOSIS — R5383 Other fatigue: Secondary | ICD-10-CM | POA: Insufficient documentation

## 2017-10-31 LAB — BASIC METABOLIC PANEL
Anion gap: 6 (ref 5–15)
BUN: 9 mg/dL (ref 6–20)
CO2: 26 mmol/L (ref 22–32)
Calcium: 9.6 mg/dL (ref 8.9–10.3)
Chloride: 110 mmol/L (ref 98–111)
Creatinine, Ser: 0.66 mg/dL (ref 0.44–1.00)
GFR calc Af Amer: >60 mL/min (ref >60)
GFR calc non Af Amer: >60 mL/min (ref >60)
Glucose, Bld: 100 mg/dL — ABNORMAL HIGH (ref 70–99)
Potassium: 4.1 mmol/L (ref 3.5–5.1)
Sodium: 142 mmol/L (ref 135–145)

## 2017-10-31 LAB — URINALYSIS, ROUTINE W REFLEX MICROSCOPIC
Bilirubin Urine: NEGATIVE
Glucose, UA: NEGATIVE mg/dL
Ketones, ur: NEGATIVE mg/dL
Leukocytes, UA: NEGATIVE
Nitrite: NEGATIVE
Protein, ur: NEGATIVE mg/dL
Specific Gravity, Urine: 1.011 (ref 1.005–1.030)
pH: 7 (ref 5.0–8.0)

## 2017-10-31 LAB — CBC
HCT: 37.9 % (ref 36.0–46.0)
Hemoglobin: 12.3 g/dL (ref 12.0–15.0)
MCH: 28.1 pg (ref 26.0–34.0)
MCHC: 32.5 g/dL (ref 30.0–36.0)
MCV: 86.7 fL (ref 78.0–100.0)
Platelets: 278 10*3/uL (ref 150–400)
RBC: 4.37 MIL/uL (ref 3.87–5.11)
RDW: 15.2 % (ref 11.5–15.5)
WBC: 5.5 10*3/uL (ref 4.0–10.5)

## 2017-10-31 LAB — I-STAT BETA HCG BLOOD, ED (MC, WL, AP ONLY): I-stat hCG, quantitative: <5 m[IU]/mL (ref <5)

## 2017-10-31 NOTE — ED Provider Notes (Signed)
[facility] DEPT Provider Note   CSN: [id number] Arrival date & time: 10/31/17  0757     History   Chief Complaint Chief Complaint  Patient presents with  . Fatigue    HPI Sherri [name] is a 32 y.o. female.  HPI Patient presents to the emergency room for moderate generalized fatigue.  Patient states she is noticed over the last couple of days she has felt fatigued and has not had much energy.  She has had some mild muscle cramping and aching.  She has also noticed some urinary frequency and urgency.  She did have one episode of nausea vomiting yesterday but none since.  Patient states she is having normal menstrual periods.  She has not noticed any blood in her stool.  She denies any fevers.  She is not having any trouble with chest pain, shortness of breath, abdominal pain, sore throat or any rashes.   Past Medical History:  Diagnosis Date  . Fibromyalgia    denies today  . Genital herpes   . Hypertension   . 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 Urinary tract infection   . Uterine fibroid     Patient Active Problem List   Diagnosis Date Noted  . Hypertension   . Genital herpes   . Yeast infection 12/03/2016  . Status post cesarean delivery 06/06/2014  . Uterine fibroid 12/25/2013  . Obesity 12/25/2013    Past Surgical History:  Procedure Laterality Date  . CESAREAN SECTION N/A 06/05/2014   Procedure: CESAREAN SECTION;  Surgeon: Truett Mainland, DO;  Location: [facility];  Service: Obstetrics;  Laterality: N/A;  . WISDOM TOOTH EXTRACTION       OB History    Gravida  1   Para  1   Term      Preterm  1   AB      Living  2     SAB      TAB      Ectopic      Multiple  1   Live Births  2            Home Medications    Prior to Admission medications   Medication Sig Start Date End Date Taking? Authorizing Provider  triamcinolone cream (KENALOG) 0.1 % Apply 1 application topically 2 (two)  times daily. Patient taking differently: Apply 1 application topically 2 (two) times daily as needed (for rash).  10/20/17  Yes Wurst, [place], PA-C  fluticasone (FLONASE) 50 MCG/ACT nasal spray Place 2 sprays into both nostrils daily. Patient not taking: Reported on 10/31/2017 08/05/17   Ok Edwards, PA-C  ibuprofen (ADVIL,MOTRIN) 400 MG tablet Take 1 tablet (400 mg total) by mouth every 6 (six) hours as needed. Patient not taking: Reported on 10/31/2017 06/25/17   Augusto Gamble B, NP  ipratropium (ATROVENT) 0.06 % nasal spray Place 2 sprays into both nostrils 4 (four) times daily. Patient not taking: Reported on 10/31/2017 08/05/17   Ok Edwards, PA-C  montelukast (SINGULAIR) 10 MG tablet Take 1 tablet (10 mg total) by mouth at bedtime. Patient not taking: Reported on 10/31/2017 08/05/17   Ok Edwards, PA-C  naproxen (NAPROSYN) 375 MG tablet Take 1 tablet (375 mg total) by mouth 2 (two) times daily as needed for moderate pain. Patient not taking: Reported on 10/31/2017 10/20/17   Lestine Box, PA-C    Family History Family History  Problem Relation Age of Onset  . Hypertension Mother   .  Hypertension Father   . Breast cancer Maternal Grandmother        58  . Colon cancer Neg Hx   . Colon polyps Neg Hx   . Kidney disease Neg Hx   . Diabetes Neg Hx   . Esophageal cancer Neg Hx   . Gallbladder disease Neg Hx   . Heart disease Neg Hx   . Asthma Neg Hx   . Cancer Neg Hx   . Stroke Neg Hx     Social History Social History   Tobacco Use  . Smoking status: Never Smoker  . Smokeless tobacco: Never Used  Substance Use Topics  . Alcohol use: No    Alcohol/week: 0.0 standard drinks    Comment: occasional when not pregnant  . Drug use: No     Allergies   Patient has no known allergies.   Review of Systems Review of Systems  All other systems reviewed and are negative.    Physical Exam Updated Vital Signs BP (!) 144/89   Pulse 64   Temp 98.3 F (36.8 C) (Oral)   Resp 16   Ht 1.499 m  (4\' 11" )   Wt 79.4 kg   LMP 10/24/2017 (Exact Date)   SpO2 100%   BMI 35.35 kg/m   Physical Exam  Constitutional: She appears well-developed and well-nourished. No distress.  HENT:  Head: Normocephalic and atraumatic.  Right Ear: External ear normal.  Left Ear: External ear normal.  Eyes: Conjunctivae are normal. Right eye exhibits no discharge. Left eye exhibits no discharge. No scleral icterus.  Neck: Neck supple. No tracheal deviation present.  Cardiovascular: Normal rate, regular rhythm and intact distal pulses.  Pulmonary/Chest: Effort normal and breath sounds normal. No stridor. No respiratory distress. She has no wheezes. She has no rales.  Abdominal: Soft. Bowel sounds are normal. She exhibits no distension. There is no tenderness. There is no rebound and no guarding.  Musculoskeletal: She exhibits no edema or tenderness.  Neurological: She is alert. She has normal strength. No cranial nerve deficit (no facial droop, extraocular movements intact, no slurred speech) or sensory deficit. She exhibits normal muscle tone. She displays no seizure activity. Coordination normal.  Skin: Skin is warm and dry. No rash noted.  Psychiatric: She has a normal mood and affect.  Nursing note and vitals reviewed.    ED Treatments / Results  Labs (all labs ordered are listed, but only abnormal results are displayed) Labs Reviewed  BASIC METABOLIC PANEL - Abnormal; Notable for the following components:      Result Value   Glucose, Bld 100 (*)    All other components within normal limits  URINALYSIS, ROUTINE W REFLEX MICROSCOPIC - Abnormal; Notable for the following components:   Hgb urine dipstick SMALL (*)    Bacteria, UA RARE (*)    All other components within normal limits  CBC  I-STAT BETA HCG BLOOD, ED ([facility], [facility], [facility] ONLY)     Procedures Procedures (including critical care time)  Medications Ordered in ED Medications - No data to display   Initial Impression / Assessment and  Plan / ED Course  I have reviewed the triage vital signs and the nursing notes.  Pertinent labs & imaging results that were available during my care of the patient were reviewed by me and considered in my medical decision making (see chart for details).  Clinical Course as of Nov 01 1019  Sun Oct 31, 2017  1019 Labs are normal.  No signs of anemia.  No leukocytosis.  No signs of UTI.   [JK]    Clinical Course User Index [JK] Dorie Rank, MD    Patient presents to the emergency room for evaluation of generalized fatigue.  She has no other obvious symptoms or findings to account for her fatigue.  Her physical exam and ED laboratory evaluation is reassuring.  If her symptoms persist could consider doing thyroid testing.  Patient is stable to follow-up with a primary care doctor.  Final Clinical Impressions(s) / ED Diagnoses   Final diagnoses:  Other fatigue    ED Discharge Orders    None       Dorie Rank, MD 10/31/17 1022

## 2017-10-31 NOTE — ED Notes (Signed)
Pt in bathroom attempting top provide a urine sample

## 2017-10-31 NOTE — Discharge Instructions (Signed)
Follow-up with a primary care through next week if you continue to have symptoms of fatigue.

## 2017-10-31 NOTE — ED Triage Notes (Signed)
Pt c/o fatigue x 3 days, generalized weakness and nausea. Pt states she had 1 episode of vomiting yesterday.

## 2017-10-31 NOTE — ED Notes (Signed)
Pt given a sandwich and soda. 

## 2017-11-10 ENCOUNTER — Ambulatory Visit: Payer: Self-pay | Attending: Family Medicine | Admitting: Physician Assistant

## 2017-11-10 VITALS — BP 132/79 | HR 94 | Temp 98.2°F | Resp 18 | Ht 59.0 in | Wt 178.0 lb

## 2017-11-10 DIAGNOSIS — R5383 Other fatigue: Secondary | ICD-10-CM | POA: Insufficient documentation

## 2017-11-10 DIAGNOSIS — I10 Essential (primary) hypertension: Secondary | ICD-10-CM | POA: Insufficient documentation

## 2017-11-10 DIAGNOSIS — N898 Other specified noninflammatory disorders of vagina: Secondary | ICD-10-CM | POA: Insufficient documentation

## 2017-11-10 DIAGNOSIS — Z09 Encounter for follow-up examination after completed treatment for conditions other than malignant neoplasm: Secondary | ICD-10-CM | POA: Insufficient documentation

## 2017-11-10 DIAGNOSIS — Z791 Long term (current) use of non-steroidal anti-inflammatories (NSAID): Secondary | ICD-10-CM | POA: Insufficient documentation

## 2017-11-10 DIAGNOSIS — R52 Pain, unspecified: Secondary | ICD-10-CM | POA: Insufficient documentation

## 2017-11-10 DIAGNOSIS — Z79899 Other long term (current) drug therapy: Secondary | ICD-10-CM | POA: Insufficient documentation

## 2017-11-10 DIAGNOSIS — Z7689 Persons encountering health services in other specified circumstances: Secondary | ICD-10-CM | POA: Insufficient documentation

## 2017-11-10 DIAGNOSIS — R350 Frequency of micturition: Secondary | ICD-10-CM | POA: Insufficient documentation

## 2017-11-10 LAB — POCT URINALYSIS DIP (CLINITEK)
Bilirubin, UA: NEGATIVE
Glucose, UA: NEGATIVE mg/dL
Ketones, POC UA: NEGATIVE mg/dL
Leukocytes, UA: NEGATIVE
Nitrite, UA: NEGATIVE
POC,PROTEIN,UA: NEGATIVE
Spec Grav, UA: 1.02 (ref 1.010–1.025)
Urobilinogen, UA: 0.2 E.U./dL
pH, UA: 6.5 (ref 5.0–8.0)

## 2017-11-10 LAB — POCT URINE PREGNANCY: Preg Test, Ur: NEGATIVE

## 2017-11-10 NOTE — Progress Notes (Signed)
Patient ID: Sherri [name], female   DOB: 14-Mar-1985, 32 y.o.   MRN: [id number]   Sherri [name], is a 32 y.o. female  IOE:[id number]  HWE:[id number]  DOB - [birth year]  Subjective:  Chief Complaint and HPI: Sherri [name] is a 32 y.o. female here today to establish care and for a follow up visit Seen on 10/31/2017 with fatigue and urinary symptoms.  No abnormalities were found.  She was discharged and advised to f/up here. She continues to feel fatigued for about 2 weeks.  She also c/o all over body aches.  She has had some urinary frequency and vaginal d/c.  No pelvic pain.  No fever.  No tick bites.  No rash.  Monogamous with BF-always use condoms.    ED/Hospital notes reviewed.   Social History:  Works in school system.     ROS:   Constitutional:  No f/c, No night sweats, No unexplained weight loss. EENT:  No vision changes, No blurry vision, No hearing changes. No mouth, throat, or ear problems.  Respiratory: No cough, No SOB Cardiac: No CP, no palpitations GI:  No abd pain, No N/V/D. GU: + Urinary s/sx as above Musculoskeletal: No joint pain Neuro: No headache, no dizziness, no motor weakness.  Skin: No rash Endocrine:  No polydipsia. No polyuria.  Psych: Denies SI/HI  No problems updated.  ALLERGIES: No Known Allergies  PAST MEDICAL HISTORY: Past Medical History:  Diagnosis Date  . Fibromyalgia    denies today  . Genital herpes   . Hypertension   . 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 Urinary tract infection   . Uterine fibroid     MEDICATIONS AT HOME: Prior to Admission medications   Medication Sig Start Date End Date Taking? Authorizing Provider  fluticasone (FLONASE) 50 MCG/ACT nasal spray Place 2 sprays into both nostrils daily. 08/05/17  Yes Yu, Amy V, PA-C  ibuprofen (ADVIL,MOTRIN) 400 MG tablet Take 1 tablet (400 mg total) by mouth every 6 (six) hours as needed. 06/25/17  Yes Burky, Lanelle Bal B, NP  ipratropium (ATROVENT) 0.06 % nasal  spray Place 2 sprays into both nostrils 4 (four) times daily. 08/05/17  Yes Yu, Amy V, PA-C  montelukast (SINGULAIR) 10 MG tablet Take 1 tablet (10 mg total) by mouth at bedtime. 08/05/17  Yes Yu, Amy V, PA-C  naproxen (NAPROSYN) 375 MG tablet Take 1 tablet (375 mg total) by mouth 2 (two) times daily as needed for moderate pain. 10/20/17  Yes Wurst, [place], PA-C  triamcinolone cream (KENALOG) 0.1 % Apply 1 application topically 2 (two) times daily. Patient taking differently: Apply 1 application topically 2 (two) times daily as needed (for rash).  10/20/17  Yes Wurst, [place], PA-C     Objective:  EXAM:   Vitals:   11/10/17 1103  BP: 132/79  Pulse: 94  Resp: 18  Temp: 98.2 F (36.8 C)  TempSrc: Oral  SpO2: 99%  Weight: 178 lb (80.7 kg)  Height: 4\' 11"  (1.499 m)    General appearance : A&OX3. NAD. Non-toxic-appearing HEENT: Atraumatic and Normocephalic.  PERRLA. EOM intact.  Neck: supple, no JVD. No cervical lymphadenopathy. No thyromegaly Chest/Lungs:  Breathing-non-labored, Good air entry bilaterally, breath sounds normal without rales, rhonchi, or wheezing  CVS: S1 S2 regular, no murmurs, gallops, rubs  Extremities: Bilateral Lower Ext shows no edema, both legs are warm to touch with = pulse throughout Neurology:  CN II-XII grossly intact, Non focal.   Psych:  TP linear. J/I WNL. Normal speech.  Appropriate eye contact and affect.  Skin:  No Rash  Data Review No results found for: HGBA1C   Assessment & Plan   1. 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 Body aches - Vitamin D, 25-hydroxy  3. Urinary frequency UA dip essentially unremarkable - Urine cytology ancillary only - POCT URINALYSIS DIP (CLINITEK)   Patient have been counseled extensively about nutrition and exercise  Return in  about 1 month (around 12/10/2017) for assign PCP; f/up fatigue.  The patient was given clear instructions to go to ER or return to medical center if symptoms don't improve, worsen or new problems develop. The patient verbalized understanding. The patient was told to call to get lab results if they haven't heard anything in the next week.     Freeman Caldron, PA-C [facility] [place], [place]   11/10/2017, 11:11 AM

## 2017-11-11 LAB — VITAMIN D 25 HYDROXY (VIT D DEFICIENCY, FRACTURES): Vit D, 25-Hydroxy: 50.2 ng/mL (ref 30.0–100.0)

## 2017-11-11 LAB — CBC WITH DIFFERENTIAL/PLATELET
Basophils Absolute: 0.1 10*3/uL (ref 0.0–0.2)
Basos: 1 %
EOS (ABSOLUTE): 0.3 10*3/uL (ref 0.0–0.4)
Eos: 4 %
Hematocrit: 37.4 % (ref 34.0–46.6)
Hemoglobin: 11.9 g/dL (ref 11.1–15.9)
Immature Grans (Abs): 0 10*3/uL (ref 0.0–0.1)
Immature Granulocytes: 0 %
Lymphocytes Absolute: 2.3 10*3/uL (ref 0.7–3.1)
Lymphs: 35 %
MCH: 27.4 pg (ref 26.6–33.0)
MCHC: 31.8 g/dL (ref 31.5–35.7)
MCV: 86 fL (ref 79–97)
Monocytes Absolute: 0.6 10*3/uL (ref 0.1–0.9)
Monocytes: 9 %
Neutrophils Absolute: 3.5 10*3/uL (ref 1.4–7.0)
Neutrophils: 51 %
Platelets: 265 10*3/uL (ref 150–450)
RBC: 4.34 x10E6/uL (ref 3.77–5.28)
RDW: 14.3 % (ref 12.3–15.4)
WBC: 6.7 10*3/uL (ref 3.4–10.8)

## 2017-11-11 LAB — BASIC METABOLIC PANEL
BUN/Creatinine Ratio: 11 (ref 9–23)
BUN: 8 mg/dL (ref 6–20)
CO2: 23 mmol/L (ref 20–29)
Calcium: 10.1 mg/dL (ref 8.7–10.2)
Chloride: 103 mmol/L (ref 96–106)
Creatinine, Ser: 0.7 mg/dL (ref 0.57–1.00)
GFR calc Af Amer: 134 mL/min/{1.73_m2} (ref >59)
GFR calc non Af Amer: 116 mL/min/{1.73_m2} (ref >59)
Glucose: 79 mg/dL (ref 65–99)
Potassium: 4.3 mmol/L (ref 3.5–5.2)
Sodium: 141 mmol/L (ref 134–144)

## 2017-11-11 LAB — URINE CYTOLOGY ANCILLARY ONLY
Chlamydia: NEGATIVE
Neisseria Gonorrhea: NEGATIVE
Trichomonas: NEGATIVE

## 2017-11-11 LAB — TSH: TSH: 1.42 u[IU]/mL (ref 0.450–4.500)

## 2017-11-12 ENCOUNTER — Telehealth: Payer: Self-pay | Admitting: *Deleted

## 2017-11-12 LAB — URINE CYTOLOGY ANCILLARY ONLY: Candida vaginitis: NEGATIVE

## 2017-11-12 NOTE — Telephone Encounter (Signed)
Patient verified DOB Patient is aware of all blood work being normal and needing to continue with the vitamin d OTC. Patient is also aware of STI's being negative and to follow up as planned. No further questions.

## 2017-11-12 NOTE — Telephone Encounter (Signed)
-----   Message from Argentina Donovan, [place] sent at 11/11/2017  8:21 AM EDT ----- Your thyroid studies, vitamin D, blood count, kidney function, electrolytes, and blood sugar are all normal.  Continue the current over the counter vitamin D you have been taking.  Follow-up as planned, sooner if needed. Thanks, Freeman Caldron, PA-C

## 2017-11-13 ENCOUNTER — Other Ambulatory Visit: Payer: Self-pay | Admitting: Physician Assistant

## 2017-11-13 MED ORDER — METRONIDAZOLE 0.75 % VA GEL: 1.0000 | Freq: Two times a day (BID) | VAGINAL | 0 refills | Status: DC

## 2017-11-16 ENCOUNTER — Telehealth: Payer: Self-pay | Admitting: *Deleted

## 2017-11-16 ENCOUNTER — Other Ambulatory Visit: Payer: Self-pay

## 2017-11-16 ENCOUNTER — Ambulatory Visit (HOSPITAL_COMMUNITY)
Admission: EM | Admit: 2017-11-16 | Discharge: 2017-11-16 | Disposition: A | Discharge status: Home / Self Care | Payer: Self-pay | Attending: Family Medicine | Admitting: Family Medicine

## 2017-11-16 ENCOUNTER — Encounter (HOSPITAL_COMMUNITY): Payer: Self-pay | Admitting: Emergency Medicine

## 2017-11-16 DIAGNOSIS — I10 Essential (primary) hypertension: Secondary | ICD-10-CM | POA: Insufficient documentation

## 2017-11-16 DIAGNOSIS — Z79899 Other long term (current) drug therapy: Secondary | ICD-10-CM | POA: Insufficient documentation

## 2017-11-16 DIAGNOSIS — B9789 Other viral agents as the cause of diseases classified elsewhere: Secondary | ICD-10-CM

## 2017-11-16 DIAGNOSIS — J069 Acute upper respiratory infection, unspecified: Secondary | ICD-10-CM | POA: Insufficient documentation

## 2017-11-16 DIAGNOSIS — R05 Cough: Secondary | ICD-10-CM | POA: Insufficient documentation

## 2017-11-16 DIAGNOSIS — Z791 Long term (current) use of non-steroidal anti-inflammatories (NSAID): Secondary | ICD-10-CM | POA: Insufficient documentation

## 2017-11-16 DIAGNOSIS — Z8249 Family history of ischemic heart disease and other diseases of the circulatory system: Secondary | ICD-10-CM | POA: Insufficient documentation

## 2017-11-16 LAB — POCT RAPID STREP A: Streptococcus, Group A Screen (Direct): NEGATIVE

## 2017-11-16 MED ORDER — CETIRIZINE HCL 10 MG PO CAPS: 10.0000 mg | ORAL_CAPSULE | Freq: Every day | ORAL | 0 refills | Status: DC

## 2017-11-16 MED ORDER — FLUTICASONE PROPIONATE 50 MCG/ACT NA SUSP: 1.0000 | Freq: Every day | NASAL | 0 refills | Status: DC

## 2017-11-16 MED ORDER — PSEUDOEPH-BROMPHEN-DM 30-2-10 MG/5ML PO SYRP: 5.0000 mL | ORAL_SOLUTION | Freq: Four times a day (QID) | ORAL | 0 refills | Status: DC | PRN

## 2017-11-16 NOTE — ED Triage Notes (Signed)
The patient presented to the [facility] with a complaint of a cough, congestion and sore throat x 3 days.

## 2017-11-16 NOTE — Discharge Instructions (Signed)
Strep test was negative, your symptoms are most likely viral and should resolve on their own in approximately 1 week.  For congestion please begin daily allergy pill like Zyrtec or Claritin, please pair with Flonase nasal spray 1 to 2 sprays in each nostril daily  You may use the cough syrup provided to help with cough as needed every 8 hours or you may use over-the-counter Robitussin or Delsym  Please continue to monitor your symptoms and follow-up if developing fever, shortness of breath, chest discomfort, persistent symptoms beyond 10 days, symptoms worsening

## 2017-11-16 NOTE — Telephone Encounter (Signed)
-----   Message from Argentina Donovan, [place] sent at 11/13/2017  6:55 PM EDT ----- You tested positive for bacterial vaginosis.  This is not an STD.  I sent a prescription to your pharmacy for the vaginal gel for you to use for treatment.  Thanks, Freeman Caldron, PA-C

## 2017-11-16 NOTE — Telephone Encounter (Signed)
Patient verified DOB Patient is aware of BV being present and needing to pick up and complete vaginal gel. No further questions.

## 2017-11-16 NOTE — ED Provider Notes (Signed)
[place]    CSN: [id number] Arrival date & time: 11/16/17  1047     History   Chief Complaint Chief Complaint  Patient presents with  . Cough  . Sore Throat    HPI Sherri [name] is a 32 y.o. female history of hypertension; Patient is presenting with URI symptoms- congestion, cough, sore throat. Patient's main complaints are overall feeling poor. Symptoms have been going on for 3 days. Patient has tried honey and other home remedies, with minimal relief. Denies fever, nausea, vomiting, diarrhea. Denies shortness of breath and chest pain.  Children with similar symptoms.   HPI  Past Medical History:  Diagnosis Date  . Fibromyalgia    denies today  . Genital herpes   . Hypertension   . 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 Urinary tract infection   . Uterine fibroid     Patient Active Problem List   Diagnosis Date Noted  . Hypertension   . Genital herpes   . Yeast infection 12/03/2016  . Status post cesarean delivery 06/06/2014  . Uterine fibroid 12/25/2013  . Obesity 12/25/2013    Past Surgical History:  Procedure Laterality Date  . CESAREAN SECTION N/A 06/05/2014   Procedure: CESAREAN SECTION;  Surgeon: Truett Mainland, DO;  Location: [facility];  Service: Obstetrics;  Laterality: N/A;  . WISDOM TOOTH EXTRACTION      OB History    Gravida  1   Para  1   Term      Preterm  1   AB      Living  2     SAB      TAB      Ectopic      Multiple  1   Live Births  2            Home Medications    Prior to Admission medications   Medication Sig Start Date End Date Taking? Authorizing Provider  ipratropium (ATROVENT) 0.06 % nasal spray Place 2 sprays into both nostrils 4 (four) times daily. 08/05/17  Yes Yu, Amy V, PA-C  brompheniramine-pseudoephedrine-DM 30-2-10 MG/5ML syrup Take 5 mLs by mouth 4 (four) times daily as needed. 11/16/17   Florabelle Cardin C, PA-C  Cetirizine HCl 10 MG CAPS Take 1 capsule (10 mg  total) by mouth daily for 10 days. 11/16/17 11/26/17  Adylin Hankey C, PA-C  fluticasone (FLONASE) 50 MCG/ACT nasal spray Place 1-2 sprays into both nostrils daily for 7 days. 11/16/17 11/23/17  Gary Gabrielsen C, PA-C  ibuprofen (ADVIL,MOTRIN) 400 MG tablet Take 1 tablet (400 mg total) by mouth every 6 (six) hours as needed. 06/25/17   Zigmund Gottron, NP    Family History Family History  Problem Relation Age of Onset  . Hypertension Mother   . Hypertension Father   . Breast cancer Maternal Grandmother        92  . Colon cancer Neg Hx   . Colon polyps Neg Hx   . Kidney disease Neg Hx   . Diabetes Neg Hx   . Esophageal cancer Neg Hx   . Gallbladder disease Neg Hx   . Heart disease Neg Hx   . Asthma Neg Hx   . Cancer Neg Hx   . Stroke Neg Hx     Social History Social History   Tobacco Use  . Smoking status: Never Smoker  . Smokeless tobacco: Never Used  Substance Use Topics  . Alcohol use: No  Alcohol/week: 0.0 standard drinks    Comment: occasional when not pregnant  . Drug use: No     Allergies   Patient has no known allergies.   Review of Systems Review of Systems  Constitutional: Negative for activity change, appetite change, chills, fatigue and fever.  HENT: Positive for congestion, rhinorrhea and sore throat. Negative for ear pain, sinus pressure and trouble swallowing.   Eyes: Negative for discharge and redness.  Respiratory: Positive for cough. Negative for chest tightness and shortness of breath.   Cardiovascular: Negative for chest pain.  Gastrointestinal: Negative for abdominal pain, diarrhea, nausea and vomiting.  Musculoskeletal: Negative for myalgias.  Skin: Negative for rash.  Neurological: Negative for dizziness, light-headedness and headaches.     Physical Exam Triage Vital Signs ED Triage Vitals  Enc Vitals Group     BP 11/16/17 1140 140/80     Pulse Rate 11/16/17 1140 80     Resp 11/16/17 1140 20     Temp 11/16/17 1140 98.7 F (37.1 C)      Temp Source 11/16/17 1140 Oral     SpO2 11/16/17 1140 99 %     Weight --      Height --      Head Circumference --      Peak Flow --      Pain Score 11/16/17 1139 8     Pain Loc --      Pain Edu? --      Excl. in [place]? --    No data found.  Updated Vital Signs BP 140/80 (BP Location: Left Arm)   Pulse 80   Temp 98.7 F (37.1 C) (Oral)   Resp 20   LMP 10/24/2017 (Exact Date)   SpO2 99%   Visual Acuity Right Eye Distance:   Left Eye Distance:   Bilateral Distance:    Right Eye Near:   Left Eye Near:    Bilateral Near:     Physical Exam  Constitutional: She is oriented to person, place, and time. She appears well-developed and well-nourished. No distress.  HENT:  Head: Normocephalic and atraumatic.  Bilateral ears without tenderness to palpation of external auricle, tragus and mastoid, EAC's without erythema or swelling, TM's with good bony landmarks and cone of light. Non erythematous.  Oral mucosa pink and moist, no tonsillar enlargement or exudate. Posterior pharynx patent and erythematous, no uvula deviation or swelling. Normal phonation.  Eyes: Conjunctivae are normal.  Neck: Neck supple.  Cardiovascular: Normal rate and regular rhythm.  No murmur heard. Pulmonary/Chest: Effort normal and breath sounds normal. No respiratory distress.  Breathing comfortably at rest, CTABL, no wheezing, rales or other adventitious sounds auscultated  Abdominal: Soft. There is no tenderness.  Musculoskeletal: She exhibits no edema.  Neurological: She is alert and oriented to person, place, and time.  Skin: Skin is warm and dry.  Psychiatric: She has a normal mood and affect.  Nursing note and vitals reviewed.    UC Treatments / Results  Labs (all labs ordered are listed, but only abnormal results are displayed) Labs Reviewed  CULTURE, GROUP A STREP [facility])  POCT RAPID STREP A    EKG None  Radiology No results found.  Procedures Procedures (including critical care  time)  Medications Ordered in UC Medications - No data to display  Initial Impression / Assessment and Plan / UC Course  I have reviewed the triage vital signs and the nursing notes.  Pertinent labs & imaging results that were available during my care  of the patient were reviewed by me and considered in my medical decision making (see chart for details).     Strep test negative, URI symptoms for 3 days, likely viral etiology, vital signs stable, exam unremarkable.  Will recommend symptomatic management.  Will recommend allergy pill and nasal spray.  Cough syrup for cough, sore throat likely related to drainage.  Continue to monitor symptoms and follow-up if not resolving or worsening.Discussed strict return precautions. Patient verbalized understanding and is agreeable with plan.  Final Clinical Impressions(s) / UC Diagnoses   Final diagnoses:  Viral URI with cough     Discharge Instructions     Strep test was negative, your symptoms are most likely viral and should resolve on their own in approximately 1 week.  For congestion please begin daily allergy pill like Zyrtec or Claritin, please pair with Flonase nasal spray 1 to 2 sprays in each nostril daily  You may use the cough syrup provided to help with cough as needed every 8 hours or you may use over-the-counter Robitussin or Delsym  Please continue to monitor your symptoms and follow-up if developing fever, shortness of breath, chest discomfort, persistent symptoms beyond 10 days, symptoms worsening   ED Prescriptions    Medication Sig Dispense Auth. Provider   Cetirizine HCl 10 MG CAPS Take 1 capsule (10 mg total) by mouth daily for 10 days. 10 capsule Milia Warth C, PA-C   fluticasone (FLONASE) 50 MCG/ACT nasal spray Place 1-2 sprays into both nostrils daily for 7 days. 1 g Xaidyn Kepner C, PA-C   brompheniramine-pseudoephedrine-DM 30-2-10 MG/5ML syrup Take 5 mLs by mouth 4 (four) times daily as needed. 120 mL Kaycee Mcgaugh,  Stormie Ventola C, PA-C     Controlled Substance Prescriptions [place] Controlled Substance Registry consulted? Not Applicable   Janith Lima, [place] 11/16/17 1455

## 2017-11-18 LAB — CULTURE, GROUP A STREP (THRC)

## 2017-11-20 ENCOUNTER — Ambulatory Visit (HOSPITAL_COMMUNITY)
Admission: EM | Admit: 2017-11-20 | Discharge: 2017-11-20 | Disposition: A | Discharge status: Home / Self Care | Payer: Self-pay | Attending: Radiology | Admitting: Radiology

## 2017-11-20 ENCOUNTER — Encounter (HOSPITAL_COMMUNITY): Payer: Self-pay | Admitting: Emergency Medicine

## 2017-11-20 DIAGNOSIS — K1379 Other lesions of oral mucosa: Secondary | ICD-10-CM

## 2017-11-20 MED ORDER — MAGIC MOUTHWASH W/LIDOCAINE: 5.0000 mL | Freq: Three times a day (TID) | ORAL | 0 refills | Status: DC | PRN

## 2017-11-20 NOTE — ED Triage Notes (Signed)
Pt sts sores on tongue x 2 days

## 2017-11-20 NOTE — Discharge Instructions (Addendum)
Limit foods that are acidic in nature such as tomatoes and orange juice.  Recommend using [employer] total.

## 2017-11-20 NOTE — ED Provider Notes (Signed)
[place]    CSN: [id number] Arrival date & time: 11/20/17  1003     History   Chief Complaint Chief Complaint  Patient presents with  . mouth pain    HPI Sherri [name] is a 32 y.o. female.   32 year old female presents with sores to bilateral aspects of her tongue x2 days.  Patient reports a history of mouth ulcers.  Condition is acute on chronic.  Condition is made worse by eating.  Patient has made better by nothing.  Patient denies any treatment prior to arrival at this facility.     Past Medical History:  Diagnosis Date  . Fibromyalgia    denies today  . Genital herpes   . Hypertension   . 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 Urinary tract infection   . Uterine fibroid     Patient Active Problem List   Diagnosis Date Noted  . Hypertension   . Genital herpes   . Yeast infection 12/03/2016  . Status post cesarean delivery 06/06/2014  . Uterine fibroid 12/25/2013  . Obesity 12/25/2013    Past Surgical History:  Procedure Laterality Date  . CESAREAN SECTION N/A 06/05/2014   Procedure: CESAREAN SECTION;  Surgeon: Truett Mainland, DO;  Location: [facility];  Service: Obstetrics;  Laterality: N/A;  . WISDOM TOOTH EXTRACTION      OB History    Gravida  1   Para  1   Term      Preterm  1   AB      Living  2     SAB      TAB      Ectopic      Multiple  1   Live Births  2            Home Medications    Prior to Admission medications   Medication Sig Start Date End Date Taking? Authorizing Provider  brompheniramine-pseudoephedrine-DM 30-2-10 MG/5ML syrup Take 5 mLs by mouth 4 (four) times daily as needed. 11/16/17   Wieters, Hallie C, PA-C  Cetirizine HCl 10 MG CAPS Take 1 capsule (10 mg total) by mouth daily for 10 days. 11/16/17 11/26/17  Wieters, Hallie C, PA-C  fluticasone (FLONASE) 50 MCG/ACT nasal spray Place 1-2 sprays into both nostrils daily for 7 days. 11/16/17 11/23/17  Wieters, Hallie C, PA-C    ibuprofen (ADVIL,MOTRIN) 400 MG tablet Take 1 tablet (400 mg total) by mouth every 6 (six) hours as needed. 06/25/17   Augusto Gamble B, NP  ipratropium (ATROVENT) 0.06 % nasal spray Place 2 sprays into both nostrils 4 (four) times daily. 08/05/17   Ok Edwards, PA-C    Family History Family History  Problem Relation Age of Onset  . Hypertension Mother   . Hypertension Father   . Breast cancer Maternal Grandmother        37  . Colon cancer Neg Hx   . Colon polyps Neg Hx   . Kidney disease Neg Hx   . Diabetes Neg Hx   . Esophageal cancer Neg Hx   . Gallbladder disease Neg Hx   . Heart disease Neg Hx   . Asthma Neg Hx   . Cancer Neg Hx   . Stroke Neg Hx     Social History Social History   Tobacco Use  . Smoking status: Never Smoker  . Smokeless tobacco: Never Used  Substance Use Topics  . Alcohol use: No    Alcohol/week: 0.0 standard  drinks    Comment: occasional when not pregnant  . Drug use: No     Allergies   Patient has no known allergies.   Review of Systems Review of Systems  Constitutional: Negative for chills and fever.  HENT: Negative for ear pain and sore throat.        Sores to both sides of tongue.  Eyes: Negative for pain and visual disturbance.  Respiratory: Negative for cough and shortness of breath.   Cardiovascular: Negative for chest pain and palpitations.  Gastrointestinal: Negative for abdominal pain and vomiting.  Genitourinary: Negative for dysuria and hematuria.  Musculoskeletal: Negative for arthralgias and back pain.  Skin: Negative for color change and rash.  Neurological: Negative for seizures and syncope.  All other systems reviewed and are negative.    Physical Exam Triage Vital Signs ED Triage Vitals  Enc Vitals Group     BP 11/20/17 1021 125/87     Pulse Rate 11/20/17 1021 76     Resp 11/20/17 1021 18     Temp 11/20/17 1021 98.3 F (36.8 C)     Temp Source 11/20/17 1021 Oral     SpO2 11/20/17 1021 99 %     Weight --       Height --      Head Circumference --      Peak Flow --      Pain Score 11/20/17 1022 6     Pain Loc --      Pain Edu? --      Excl. in [place]? --    No data found.  Updated Vital Signs BP 125/87 (BP Location: Left Arm)   Pulse 76   Temp 98.3 F (36.8 C) (Oral)   Resp 18   LMP 10/24/2017 (Exact Date)   SpO2 99%   Visual Acuity Right Eye Distance:   Left Eye Distance:   Bilateral Distance:    Right Eye Near:   Left Eye Near:    Bilateral Near:     Physical Exam  Constitutional: She is oriented to person, place, and time. She appears well-developed and well-nourished.  HENT:  Head: Normocephalic and atraumatic.  White ulcers noted to bilateral sides of tongue.  No induration or redness noted.  Eyes: Conjunctivae are normal.  Neck: Normal range of motion.  Pulmonary/Chest: Effort normal.  Musculoskeletal: Normal range of motion.  Neurological: She is alert and oriented to person, place, and time.  Skin: Skin is warm.  Psychiatric: She has a normal mood and affect.  Nursing note and vitals reviewed.    UC Treatments / Results  Labs (all labs ordered are listed, but only abnormal results are displayed) Labs Reviewed - No data to display  EKG None  Radiology No results found.  Procedures Procedures (including critical care time)  Medications Ordered in UC Medications - No data to display  Initial Impression / Assessment and Plan / UC Course  I have reviewed the triage vital signs and the nursing notes.  Pertinent labs & imaging results that were available during my care of the patient were reviewed by me and considered in my medical decision making (see chart for details).      Final Clinical Impressions(s) / UC Diagnoses   Final diagnoses:  None   Discharge Instructions   None    ED Prescriptions    None     Controlled Substance Prescriptions [place] Controlled Substance Registry consulted? Not Applicable   Jacqualine Mau, NP 11/20/17  1100

## 2017-12-10 ENCOUNTER — Ambulatory Visit: Payer: Self-pay | Admitting: Family Medicine

## 2017-12-15 ENCOUNTER — Encounter (HOSPITAL_COMMUNITY): Payer: Self-pay | Admitting: Emergency Medicine

## 2017-12-15 ENCOUNTER — Ambulatory Visit (HOSPITAL_COMMUNITY)
Admission: EM | Admit: 2017-12-15 | Discharge: 2017-12-15 | Disposition: A | Discharge status: Home / Self Care | Payer: Self-pay | Attending: Family Medicine | Admitting: Family Medicine

## 2017-12-15 DIAGNOSIS — Z791 Long term (current) use of non-steroidal anti-inflammatories (NSAID): Secondary | ICD-10-CM | POA: Insufficient documentation

## 2017-12-15 DIAGNOSIS — I10 Essential (primary) hypertension: Secondary | ICD-10-CM | POA: Insufficient documentation

## 2017-12-15 DIAGNOSIS — N898 Other specified noninflammatory disorders of vagina: Secondary | ICD-10-CM | POA: Insufficient documentation

## 2017-12-15 DIAGNOSIS — Z9889 Other specified postprocedural states: Secondary | ICD-10-CM | POA: Insufficient documentation

## 2017-12-15 DIAGNOSIS — R350 Frequency of micturition: Secondary | ICD-10-CM | POA: Insufficient documentation

## 2017-12-15 DIAGNOSIS — Z79899 Other long term (current) drug therapy: Secondary | ICD-10-CM | POA: Insufficient documentation

## 2017-12-15 LAB — POCT URINALYSIS DIP (DEVICE)
Bilirubin Urine: NEGATIVE
Glucose, UA: NEGATIVE mg/dL
Hgb urine dipstick: NEGATIVE
Ketones, ur: NEGATIVE mg/dL
Nitrite: NEGATIVE
Protein, ur: NEGATIVE mg/dL
Specific Gravity, Urine: 1.025 (ref 1.005–1.030)
Urobilinogen, UA: 0.2 mg/dL (ref 0.0–1.0)
pH: 5.5 (ref 5.0–8.0)

## 2017-12-15 LAB — POCT PREGNANCY, URINE: Preg Test, Ur: NEGATIVE

## 2017-12-15 MED ORDER — FLUCONAZOLE 150 MG PO TABS: 150.0000 mg | ORAL_TABLET | Freq: Every day | ORAL | 0 refills | Status: DC

## 2017-12-15 NOTE — Discharge Instructions (Signed)
Start diflucan as directed for possible yeast infection. Cytology sent, you will be contacted with any positive results that requires further treatment. Refrain from sexual activity and alcohol use for the next 7 days. Monitor for any worsening of symptoms, fever, abdominal pain, nausea, vomiting, to follow up for reevaluation.

## 2017-12-15 NOTE — ED Triage Notes (Signed)
Pt c/o vaginal burning and itching x3 days. With some discharge

## 2017-12-15 NOTE — ED Provider Notes (Signed)
[place]    CSN: [id number] Arrival date & time: 12/15/17  1209     History   Chief Complaint Chief Complaint  Patient presents with  . Vaginal Discharge    HPI Sherri [name] is a 32 y.o. female.   32 year old female comes in for 3 day history of vaginal discharge and itching. States discharge is "clumpy" in appearance. Denies abdominal pain, nausea, vomiting. Denies fever, chills, night sweats. Has urinary frequency without hematuria. States burning with urination, but more surrounding labial burning. No knew hygiene product changes, exposure. Sexually active with one female partner, no condom use. LMP 12/01/2017     Past Medical History:  Diagnosis Date  . Fibromyalgia    denies today  . Genital herpes   . Hypertension   . 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 Urinary tract infection   . Uterine fibroid     Patient Active Problem List   Diagnosis Date Noted  . Hypertension   . Genital herpes   . Yeast infection 12/03/2016  . Status post cesarean delivery 06/06/2014  . Uterine fibroid 12/25/2013  . Obesity 12/25/2013    Past Surgical History:  Procedure Laterality Date  . CESAREAN SECTION N/A 06/05/2014   Procedure: CESAREAN SECTION;  Surgeon: Truett Mainland, DO;  Location: [facility];  Service: Obstetrics;  Laterality: N/A;  . WISDOM TOOTH EXTRACTION      OB History    Gravida  1   Para  1   Term      Preterm  1   AB      Living  2     SAB      TAB      Ectopic      Multiple  1   Live Births  2            Home Medications    Prior to Admission medications   Medication Sig Start Date End Date Taking? Authorizing Provider  brompheniramine-pseudoephedrine-DM 30-2-10 MG/5ML syrup Take 5 mLs by mouth 4 (four) times daily as needed. Patient not taking: Reported on 12/15/2017 11/16/17   Wieters, Madelynn Done C, PA-C  Cetirizine HCl 10 MG CAPS Take 1 capsule (10 mg total) by mouth daily for 10  days. Patient not taking: Reported on 12/15/2017 11/16/17 11/26/17  Wieters, Hallie C, PA-C  fluconazole (DIFLUCAN) 150 MG tablet Take 1 tablet (150 mg total) by mouth daily. Take second dose 72 hours later if symptoms still persists. 12/15/17   Tasia Catchings, Amy V, PA-C  fluticasone (FLONASE) 50 MCG/ACT nasal spray Place 1-2 sprays into both nostrils daily for 7 days. Patient not taking: Reported on 12/15/2017 11/16/17 11/23/17  Wieters, Hallie C, PA-C  ibuprofen (ADVIL,MOTRIN) 400 MG tablet Take 1 tablet (400 mg total) by mouth every 6 (six) hours as needed. Patient not taking: Reported on 12/15/2017 06/25/17   Augusto Gamble B, NP  ipratropium (ATROVENT) 0.06 % nasal spray Place 2 sprays into both nostrils 4 (four) times daily. Patient not taking: Reported on 12/15/2017 08/05/17   Ok Edwards, PA-C  magic mouthwash w/lidocaine SOLN Take 5 mLs by mouth 3 (three) times daily as needed for mouth pain. Patient not taking: Reported on 12/15/2017 11/20/17   Jacqualine Mau, NP    Family History Family History  Problem Relation Age of Onset  . Hypertension Mother   . Hypertension Father   . Breast cancer Maternal Grandmother        77  .  Colon cancer Neg Hx   . Colon polyps Neg Hx   . Kidney disease Neg Hx   . Diabetes Neg Hx   . Esophageal cancer Neg Hx   . Gallbladder disease Neg Hx   . Heart disease Neg Hx   . Asthma Neg Hx   . Cancer Neg Hx   . Stroke Neg Hx     Social History Social History   Tobacco Use  . Smoking status: Never Smoker  . Smokeless tobacco: Never Used  Substance Use Topics  . Alcohol use: No    Alcohol/week: 0.0 standard drinks    Comment: occasional when not pregnant  . Drug use: No     Allergies   Patient has no known allergies.   Review of Systems Review of Systems  Reason unable to perform ROS: See HPI as above.     Physical Exam Triage Vital Signs ED Triage Vitals [12/15/17 1328]  Enc Vitals Group     BP (!) 129/92     Pulse Rate 80     Resp 18      Temp 98.6 F (37 C)     Temp src      SpO2 100 %     Weight      Height      Head Circumference      Peak Flow      Pain Score 10     Pain Loc      Pain Edu?      Excl. in [place]?    No data found.  Updated Vital Signs BP (!) 129/92   Pulse 80   Temp 98.6 F (37 C)   Resp 18   LMP 12/01/2017   SpO2 100%   Physical Exam  Constitutional: She is oriented to person, place, and time. She appears well-developed and well-nourished. No distress.  HENT:  Head: Normocephalic and atraumatic.  Eyes: Pupils are equal, round, and reactive to light. Conjunctivae are normal.  Cardiovascular: Normal rate, regular rhythm and normal heart sounds. Exam reveals no gallop and no friction rub.  No murmur heard. Pulmonary/Chest: Effort normal and breath sounds normal. She has no wheezes. She has no rales.  Abdominal: Soft. Bowel sounds are normal. She exhibits no mass. There is no tenderness. There is no rebound, no guarding and no CVA tenderness.  Neurological: She is alert and oriented to person, place, and time.  Skin: Skin is warm and dry.  Psychiatric: She has a normal mood and affect. Her behavior is normal. Judgment normal.     UC Treatments / Results  Labs (all labs ordered are listed, but only abnormal results are displayed) Labs Reviewed  POCT URINALYSIS DIP (DEVICE) - Abnormal; Notable for the following components:      Result Value   Leukocytes, UA TRACE (*)    All other components within normal limits  URINE CULTURE  POCT PREGNANCY, URINE  CERVICOVAGINAL ANCILLARY ONLY    EKG None  Radiology No results found.  Procedures Procedures (including critical care time)  Medications Ordered in UC Medications - No data to display  Initial Impression / Assessment and Plan / UC Course  I have reviewed the triage vital signs and the nursing notes.  Pertinent labs & imaging results that were available during my care of the patient were reviewed by me and considered in my  medical decision making (see chart for details).    Patient was treated empirically for yeast. Diflucan as directed. Trace leuks in urine,  will send for urine culture. Cytology sent, patient will be contacted with any positive results that require additional treatment. Patient to refrain from sexual activity for the next 7 days. Return precautions given.   Final Clinical Impressions(s) / UC Diagnoses   Final diagnoses:  Vaginal discharge    ED Prescriptions    Medication Sig Dispense Auth. Provider   fluconazole (DIFLUCAN) 150 MG tablet Take 1 tablet (150 mg total) by mouth daily. Take second dose 72 hours later if symptoms still persists. 2 tablet Tobin Chad, PA-C 12/15/17 1421

## 2017-12-16 ENCOUNTER — Ambulatory Visit: Payer: Self-pay

## 2017-12-16 ENCOUNTER — Ambulatory Visit: Payer: Self-pay | Admitting: Family Medicine

## 2017-12-16 LAB — URINE CULTURE: Culture: NO GROWTH

## 2017-12-16 LAB — CERVICOVAGINAL ANCILLARY ONLY
Chlamydia: NEGATIVE
Neisseria Gonorrhea: NEGATIVE
Trichomonas: NEGATIVE

## 2017-12-31 ENCOUNTER — Encounter (HOSPITAL_COMMUNITY): Payer: Self-pay | Admitting: Emergency Medicine

## 2017-12-31 ENCOUNTER — Ambulatory Visit (HOSPITAL_COMMUNITY)
Admission: EM | Admit: 2017-12-31 | Discharge: 2017-12-31 | Disposition: A | Discharge status: Home / Self Care | Payer: Self-pay | Attending: Family Medicine | Admitting: Family Medicine

## 2017-12-31 DIAGNOSIS — H9203 Otalgia, bilateral: Secondary | ICD-10-CM | POA: Insufficient documentation

## 2017-12-31 DIAGNOSIS — H6983 Other specified disorders of Eustachian tube, bilateral: Secondary | ICD-10-CM | POA: Insufficient documentation

## 2017-12-31 DIAGNOSIS — N898 Other specified noninflammatory disorders of vagina: Secondary | ICD-10-CM | POA: Insufficient documentation

## 2017-12-31 DIAGNOSIS — H6993 Unspecified Eustachian tube disorder, bilateral: Secondary | ICD-10-CM

## 2017-12-31 DIAGNOSIS — R112 Nausea with vomiting, unspecified: Secondary | ICD-10-CM | POA: Insufficient documentation

## 2017-12-31 DIAGNOSIS — Z79899 Other long term (current) drug therapy: Secondary | ICD-10-CM | POA: Insufficient documentation

## 2017-12-31 LAB — POCT URINALYSIS DIP (DEVICE)
Bilirubin Urine: NEGATIVE
Glucose, UA: NEGATIVE mg/dL
Ketones, ur: NEGATIVE mg/dL
Leukocytes, UA: NEGATIVE
Nitrite: NEGATIVE
Protein, ur: NEGATIVE mg/dL
Specific Gravity, Urine: 1.02 (ref 1.005–1.030)
Urobilinogen, UA: 0.2 mg/dL (ref 0.0–1.0)
pH: 5.5 (ref 5.0–8.0)

## 2017-12-31 LAB — POCT PREGNANCY, URINE: Preg Test, Ur: NEGATIVE

## 2017-12-31 MED ORDER — CETIRIZINE HCL 10 MG PO CAPS: 10.0000 mg | ORAL_CAPSULE | Freq: Every day | ORAL | 0 refills | Status: DC

## 2017-12-31 MED ORDER — FLUTICASONE PROPIONATE 50 MCG/ACT NA SUSP: 1.0000 | Freq: Every day | NASAL | 2 refills | Status: DC

## 2017-12-31 MED ORDER — ONDANSETRON 4 MG PO TBDP
ORAL_TABLET | ORAL | Status: AC
  Filled 2017-12-31: qty 1

## 2017-12-31 MED ORDER — ONDANSETRON HCL 4 MG PO TABS: 4.0000 mg | ORAL_TABLET | Freq: Four times a day (QID) | ORAL | 0 refills | Status: DC

## 2017-12-31 MED ORDER — ONDANSETRON 4 MG PO TBDP
4.0000 mg | ORAL_TABLET | Freq: Once | ORAL | Status: AC
  Administered 2017-12-31: 4 mg via ORAL

## 2017-12-31 NOTE — ED Provider Notes (Signed)
[facility]    CSN: [id number] Arrival date & time: 12/31/17  1055     History   Chief Complaint Chief Complaint  Patient presents with  . Otalgia    HPI Sherri [name] is a 32 y.o. female.   Pt is also having some nausea and vomiting. 3 episodes of non bilious vomiting. Some dysuria and lower abd pressure. No fever, chills. Also some vaginal discharge. No vaginal bleeding.  Her last menstrual cycle was last week.   ROS per HPI    Otalgia  Location:  Bilateral Behind ear:  No abnormality Quality:  Aching and pressure Severity:  Mild Onset quality:  Gradual Timing:  Intermittent Progression:  Waxing and waning Chronicity:  New Context: not direct blow, not elevation change, not foreign body in ear, not loud noise, not recent URI and not water in ear   Relieved by:  Nothing Worsened by:  Nothing Ineffective treatments:  None tried Associated symptoms: hearing loss   Associated symptoms: no abdominal pain, no congestion, no cough, no diarrhea, no ear discharge, no fever, no headaches, no neck pain, no rash, no rhinorrhea, no sore throat and no tinnitus   Risk factors: no recent travel, no chronic ear infection and no prior ear surgery     Past Medical History:  Diagnosis Date  . Fibromyalgia    denies today  . Genital herpes   . Hypertension   . 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 Urinary tract infection   . Uterine fibroid     Patient Active Problem List   Diagnosis Date Noted  . Hypertension   . Genital herpes   . Yeast infection 12/03/2016  . Status post cesarean delivery 06/06/2014  . Uterine fibroid 12/25/2013  . Obesity 12/25/2013    Past Surgical History:  Procedure Laterality Date  . CESAREAN SECTION N/A 06/05/2014   Procedure: CESAREAN SECTION;  Surgeon: Truett Mainland, DO;  Location: [facility];  Service: Obstetrics;  Laterality: N/A;  . WISDOM TOOTH EXTRACTION      OB History    Gravida  1   Para  1     Term      Preterm  1   AB      Living  2     SAB      TAB      Ectopic      Multiple  1   Live Births  2            Home Medications    Prior to Admission medications   Medication Sig Start Date End Date Taking? Authorizing Provider  brompheniramine-pseudoephedrine-DM 30-2-10 MG/5ML syrup Take 5 mLs by mouth 4 (four) times daily as needed. Patient not taking: Reported on 12/15/2017 11/16/17   Wieters, Madelynn Done C, PA-C  Cetirizine HCl 10 MG CAPS Take 1 capsule (10 mg total) by mouth daily for 10 days. 12/31/17 01/10/18  Loura Halt A, NP  fluconazole (DIFLUCAN) 150 MG tablet Take 1 tablet (150 mg total) by mouth daily. Take second dose 72 hours later if symptoms still persists. Patient not taking: Reported on 12/31/2017 12/15/17   Ok Edwards, PA-C  fluticasone [facility]) 50 MCG/ACT nasal spray Place 1-2 sprays into both nostrils daily for 7 days. Patient not taking: Reported on 12/15/2017 11/16/17 11/23/17  Wieters, Hallie C, PA-C  fluticasone (FLONASE) 50 MCG/ACT nasal spray Place 1 spray into both nostrils daily. 12/31/17   Orvan July, NP  ibuprofen (ADVIL,MOTRIN) 400  MG tablet Take 1 tablet (400 mg total) by mouth every 6 (six) hours as needed. Patient not taking: Reported on 12/15/2017 06/25/17   Augusto Gamble B, NP  ipratropium (ATROVENT) 0.06 % nasal spray Place 2 sprays into both nostrils 4 (four) times daily. Patient not taking: Reported on 12/15/2017 08/05/17   Ok Edwards, PA-C  magic mouthwash w/lidocaine SOLN Take 5 mLs by mouth 3 (three) times daily as needed for mouth pain. Patient not taking: Reported on 12/15/2017 11/20/17   Jacqualine Mau, NP  ondansetron (ZOFRAN) 4 MG tablet Take 1 tablet (4 mg total) by mouth every 6 (six) hours. 12/31/17   Orvan July, NP    Family History Family History  Problem Relation Age of Onset  . Hypertension Mother   . Hypertension Father   . Breast cancer Maternal Grandmother        64  . Colon cancer Neg Hx   . Colon  polyps Neg Hx   . Kidney disease Neg Hx   . Diabetes Neg Hx   . Esophageal cancer Neg Hx   . Gallbladder disease Neg Hx   . Heart disease Neg Hx   . Asthma Neg Hx   . Cancer Neg Hx   . Stroke Neg Hx     Social History Social History   Tobacco Use  . Smoking status: Never Smoker  . Smokeless tobacco: Never Used  Substance Use Topics  . Alcohol use: No    Alcohol/week: 0.0 standard drinks    Comment: occasional when not pregnant  . Drug use: No     Allergies   Patient has no known allergies.   Review of Systems Review of Systems  Constitutional: Negative for fever.  HENT: Positive for ear pain and hearing loss. Negative for congestion, ear discharge, rhinorrhea, sore throat and tinnitus.   Respiratory: Negative for cough.   Gastrointestinal: Negative for abdominal pain and diarrhea.  Musculoskeletal: Negative for neck pain.  Skin: Negative for rash.  Neurological: Negative for headaches.     Physical Exam Triage Vital Signs ED Triage Vitals  Enc Vitals Group     BP 12/31/17 1150 136/73     Pulse Rate 12/31/17 1150 75     Resp 12/31/17 1150 18     Temp 12/31/17 1150 98.2 F (36.8 C)     Temp src --      SpO2 12/31/17 1150 99 %     Weight --      Height --      Head Circumference --      Peak Flow --      Pain Score 12/31/17 1151 9     Pain Loc --      Pain Edu? --      Excl. in [place]? --    No data found.  Updated Vital Signs BP 136/73   Pulse 75   Temp 98.2 F (36.8 C)   Resp 18   LMP 12/01/2017   SpO2 99%   Visual Acuity Right Eye Distance:   Left Eye Distance:   Bilateral Distance:    Right Eye Near:   Left Eye Near:    Bilateral Near:     Physical Exam  Constitutional: She is oriented to person, place, and time. She appears well-developed and well-nourished.  Very pleasant. Non toxic or ill appearing.   HENT:  Head: Normocephalic and atraumatic.  Bilateral TMs normal.  External ears normal.  Without posterior oropharyngeal  erythema, tonsillar swelling or  exudates. No lesions.   No lymphadenopathy.     Eyes: Conjunctivae are normal.  Neck: Normal range of motion.  Cardiovascular: Normal rate, regular rhythm and normal heart sounds.  Pulmonary/Chest: Effort normal and breath sounds normal.  Lungs clear in all fields. No dyspnea or distress. No retractions or nasal flaring.   Abdominal: Soft. Bowel sounds are normal.  Abdomen soft, non tender. No CVA tenderness. No rebound tenderness.   Musculoskeletal: Normal range of motion.  Neurological: She is alert and oriented to person, place, and time.  Skin: Skin is warm and dry. No rash noted. No erythema. No pallor.  Psychiatric: She has a normal mood and affect.  Nursing note and vitals reviewed.    UC Treatments / Results  Labs (all labs ordered are listed, but only abnormal results are displayed) Labs Reviewed  POCT URINALYSIS DIP (DEVICE) - Abnormal; Notable for the following components:      Result Value   Hgb urine dipstick TRACE (*)    All other components within normal limits  POCT PREGNANCY, URINE  URINE CYTOLOGY ANCILLARY ONLY    EKG None  Radiology No results found.  Procedures Procedures (including critical care time)  Medications Ordered in UC Medications  ondansetron (ZOFRAN-ODT) disintegrating tablet 4 mg (4 mg Oral Given 12/31/17 1223)    Initial Impression / Assessment and Plan / UC Course  I have reviewed the triage vital signs and the nursing notes.  Pertinent labs & imaging results that were available during my care of the patient were reviewed by me and considered in my medical decision making (see chart for details).     Urine negative for pregnancy or infection Urine sent for STD testing Most likely symptoms are related to eustachian tube dysfunction and sinus drainage causing nausea and vomiting. We will treat her nausea vomiting with Zofran and Flonase and Zyrtec for eustachian tube dysfunction Follow up as needed  for continued or worsening symptoms  Final Clinical Impressions(s) / UC Diagnoses   Final diagnoses:  Dysfunction of both eustachian tubes  Nausea and vomiting, intractability of vomiting not specified, unspecified vomiting type     Discharge Instructions     I believe that your symptoms are related to eustachian tube dysfunction The Flonase and zyrtec should help with that.  Your urine was negative for infection or pregnancy We will send urine for STD testing and testing for bacterial and yeast. I believe the nausea is coming from the drainage from her sinuses      ED Prescriptions    Medication Sig Dispense Auth. Provider   Cetirizine HCl 10 MG CAPS Take 1 capsule (10 mg total) by mouth daily for 10 days. 10 capsule Lyrah Bradt A, NP   fluticasone (FLONASE) 50 MCG/ACT nasal spray Place 1 spray into both nostrils daily. 16 g Cordaryl Decelles A, NP   ondansetron (ZOFRAN) 4 MG tablet Take 1 tablet (4 mg total) by mouth every 6 (six) hours. 12 tablet Loura Halt A, NP     Controlled Substance Prescriptions [place] Controlled Substance Registry consulted? Not Applicable   Orvan July, NP 12/31/17 1236

## 2017-12-31 NOTE — Discharge Instructions (Addendum)
I believe that your symptoms are related to eustachian tube dysfunction The Flonase and zyrtec should help with that.  Your urine was negative for infection or pregnancy We will send urine for STD testing and testing for bacterial and yeast. I believe the nausea is coming from the drainage from her sinuses

## 2017-12-31 NOTE — ED Triage Notes (Signed)
Pt c/o bilateral ear pain and nausea x3 days

## 2018-01-03 LAB — URINE CYTOLOGY ANCILLARY ONLY
Chlamydia: NEGATIVE
Neisseria Gonorrhea: NEGATIVE
Trichomonas: NEGATIVE

## 2018-01-05 LAB — URINE CYTOLOGY ANCILLARY ONLY: Candida vaginitis: NEGATIVE

## 2018-01-06 ENCOUNTER — Telehealth: Payer: Self-pay | Admitting: Emergency Medicine

## 2018-01-06 ENCOUNTER — Encounter: Payer: Self-pay | Admitting: Family Medicine

## 2018-01-06 ENCOUNTER — Ambulatory Visit: Payer: Self-pay | Attending: Family Medicine | Admitting: Licensed Clinical Social Worker

## 2018-01-06 ENCOUNTER — Ambulatory Visit: Payer: Self-pay | Attending: Family Medicine | Admitting: Family Medicine

## 2018-01-06 VITALS — BP 133/84 | HR 66 | Temp 99.0°F | Ht 59.0 in | Wt 177.6 lb

## 2018-01-06 DIAGNOSIS — B9689 Other specified bacterial agents as the cause of diseases classified elsewhere: Secondary | ICD-10-CM | POA: Insufficient documentation

## 2018-01-06 DIAGNOSIS — R5383 Other fatigue: Secondary | ICD-10-CM

## 2018-01-06 DIAGNOSIS — K219 Gastro-esophageal reflux disease without esophagitis: Secondary | ICD-10-CM

## 2018-01-06 DIAGNOSIS — R1011 Right upper quadrant pain: Secondary | ICD-10-CM

## 2018-01-06 DIAGNOSIS — G479 Sleep disorder, unspecified: Secondary | ICD-10-CM

## 2018-01-06 DIAGNOSIS — O99345 Other mental disorders complicating the puerperium: Secondary | ICD-10-CM

## 2018-01-06 DIAGNOSIS — K210 Gastro-esophageal reflux disease with esophagitis: Secondary | ICD-10-CM | POA: Insufficient documentation

## 2018-01-06 DIAGNOSIS — M797 Fibromyalgia: Secondary | ICD-10-CM | POA: Insufficient documentation

## 2018-01-06 DIAGNOSIS — F419 Anxiety disorder, unspecified: Secondary | ICD-10-CM

## 2018-01-06 DIAGNOSIS — N898 Other specified noninflammatory disorders of vagina: Secondary | ICD-10-CM

## 2018-01-06 DIAGNOSIS — I10 Essential (primary) hypertension: Secondary | ICD-10-CM | POA: Insufficient documentation

## 2018-01-06 DIAGNOSIS — Z8249 Family history of ischemic heart disease and other diseases of the circulatory system: Secondary | ICD-10-CM | POA: Insufficient documentation

## 2018-01-06 DIAGNOSIS — N760 Acute vaginitis: Secondary | ICD-10-CM | POA: Insufficient documentation

## 2018-01-06 DIAGNOSIS — F53 Postpartum depression: Secondary | ICD-10-CM

## 2018-01-06 DIAGNOSIS — R309 Painful micturition, unspecified: Secondary | ICD-10-CM | POA: Insufficient documentation

## 2018-01-06 DIAGNOSIS — R350 Frequency of micturition: Secondary | ICD-10-CM

## 2018-01-06 LAB — POCT GLYCOSYLATED HEMOGLOBIN (HGB A1C): Hemoglobin A1C: 5.1 % (ref 4.0–5.6)

## 2018-01-06 LAB — POCT URINALYSIS DIP (CLINITEK)
Bilirubin, UA: NEGATIVE
Blood, UA: NEGATIVE
Glucose, UA: NEGATIVE mg/dL
Ketones, POC UA: NEGATIVE mg/dL
Leukocytes, UA: NEGATIVE
Nitrite, UA: NEGATIVE
POC,PROTEIN,UA: NEGATIVE
Spec Grav, UA: 1.015
Urobilinogen, UA: 0.2 U/dL
pH, UA: 6

## 2018-01-06 MED ORDER — FAMOTIDINE 20 MG PO TABS: 20.0000 mg | ORAL_TABLET | Freq: Two times a day (BID) | ORAL | 4 refills | Status: DC

## 2018-01-06 MED ORDER — METRONIDAZOLE 500 MG PO TABS: 500.0000 mg | ORAL_TABLET | Freq: Two times a day (BID) | ORAL | 0 refills | Status: DC

## 2018-01-06 NOTE — BH Specialist Note (Signed)
Integrated Behavioral Health Initial Visit  MRN: [id number] Name: Sherri [name]  Number of [place] Clinician visits:: 1/6 Session Start time: 10:10 AM  Session End time: 10:30 AM Total time: 25 minutes  Type of Service: [place] Interpretor:No.    Warm Hand Off Completed.       SUBJECTIVE: BABS DABBS is a 32 y.o. female accompanied by SELF Patient was referred by PCP Cammie Fulp for depression and anxiety. Patient reports the following symptoms/concerns: Pt reports that she is currently experiencing little interest in performing activities, continuous sadness, trouble falling asleep every night, and difficulty relaxing. Pt shared that she began experiencing these symptoms once she had her twins.  Duration of problem: 3 years; Severity of problem: moderate  OBJECTIVE: Mood: Anxious and Depressed and Affect: Appropriate Risk of harm to self or others: No plan to harm self or others  LIFE CONTEXT: Family and Social: Pt shared that she does not have much support due to her family residing in [place]. Pt shared that her children's father supports her and she is able to call her mom to talk when needed.  School/Work: Pt is currently employed. Pt shared that finances can still be a worry at times. Self-Care: Pt shared that she has minimal self-care activities. Pt shared that she watches television while her twins are sleeping. Pt stated "I go to work and I feel like I come home to my second job". No substance use history. Life Changes: Pt had twins approximately 3 years ago. Pt has found it difficult to cope with being a new mother.   GOALS ADDRESSED: Patient will: 1. Reduce symptoms of: anxiety, depression, insomnia and stress 2. Increase knowledge and/or ability of: coping skills and stress reduction  3. Demonstrate ability to: Increase healthy adjustment to current life circumstances  INTERVENTIONS: Interventions  utilized: Mindfulness or Psychologist, educational, Supportive Counseling, Sleep Hygiene and Link to [employer]  Standardized Assessments completed: GAD-7 and PHQ 2&9  ASSESSMENT: Patient currently experiencing postpartum depressive symptoms. Pt had twins approximately three years ago and has been dealing with trouble falling asleep each night. Pt reports that she does not have any time for herself. Pt shared that she lacks motivation to perform daily activities. Pt shared that she persistently worries and deals with unhappiness. Pt also shared that she feels disconnected from her family because they live in [place]. Pt reports no history of depression and anxiety, with no history of treatment. No suicidal/homicidal ideations. No substance use history. Pt shared that she has found it difficult to cope with having twins because she rarely has time for herself. Pt shared that she often feels overwhelmed and consistently stressed. No reports of appetite problems. Pt reports that she is easily annoyed.    Patient may benefit from counseling/mom-support groups to assist with depression. Pt depression has been occurring since birth of twins and has gone untreated, it is recommended that pt follow-up if she chooses not to attend counseling with [facility], family services, or support group. MSW intern informed pt about mindful meditation and deep breathing exercises to assist with falling asleep.   PLAN: 1. Follow up with behavioral health clinician on : MSW intern encouraged pt to schedule an appt as needed. MSW intern informed pt that she will follow-up.  2. Behavioral recommendations: MSW intern recommends pt engage in mindful meditation and deep breathing exercises. MSW intern also recommends pt attend counseling and support group, as postpartum depression has been occurring for a long period.  Pt is encouraged to encourage to increase coping skills and locate at least 67min/day to take time for herself.   3. Referral(s): [employer] (In Clinic) and [place] (LME/Outside Clinic) 4. "From scale of 1-10, how likely are you to follow plan?":   Ruffin Pyo, MSW Intern 01/06/18, 11:43 AM

## 2018-01-06 NOTE — Progress Notes (Signed)
Subjective:    Patient ID: Sherri [name], female    DOB: Feb 17, 1986, 32 y.o.   MRN: [id number]  HPI 32 year old female who is new to me as a patient who presents secondary to complaint of ongoing issues with fatigue as well as recurrent vaginal itching and urinary frequency.  On review of chart, patient was seen in the office on 11/10/2017 by another provider secondary to complaint of both fatigue and urinary symptoms for which she had also been seen in the emergency department on 10/31/2017.  Patient had multiple labs at her visit on 11/10/2017 in follow-up of her fatigue including TSH, CBC, urine pregnancy test and basic metabolic panel.  Patient had normal urinalysis and urine cytology was also performed.  On review of chart, patient has also been seen at [facility] urgent care on 11/16/2017, 11/20/2017, 12/15/2017 and 12/31/2017.      At today's visit, patient states that she has had increased fatigue for the past 3 to 4 weeks.  Patient also with complaint of recurrent vaginal discharge as well as vaginal itching.  Patient has also had urinary frequency and burning with urination.  Patient denies any fever, chills, lymphadenopathy or night sweats.  Patient denies anxiety or depression.  Patient states that she does have twin 52-year-old boys.  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  Patient states that she gets home at around 35 PM.  Patient states that her sons' father picks up the boys from daycare and gets them ready for bed so that they are asleep before she gets home on work days.  Patient reports that she does not have a lot of difficulty falling asleep but does have difficulty staying asleep but she will wake up in about 3 to 3:30 AM and have difficulty falling back asleep.  Patient does have to get up early in order to take her son's to daycare.       Regarding issues of vaginal discharge as well as vaginal itching, patient states that she  has previously been diagnosed with an overgrowth of bacteria in the vaginal area and has taken antibiotics to treat this but patient believes that this never fully cleared up her issues.  Patient denies any vaginal or pelvic pain.  Patient does have some burning with urination but patient denies any issues with increased back pain and no fever or chills.  Patient does have some occasional nausea but states that she believes that this is related to reflux symptoms.  Patient does have occasional burping/belching and backwash of fluid into her throat.  Patient states that she does tend to eat a lot of fast food.  Patient also has some issues with increased nausea as well as diarrhea after eating fatty/greasy foods. Past Medical History:  Diagnosis Date  . Fibromyalgia    denies today  . Genital herpes   . Hypertension   . 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 Urinary tract infection   . Uterine fibroid    Past Surgical History:  Procedure Laterality Date  . CESAREAN SECTION N/A 06/05/2014   Procedure: CESAREAN SECTION;  Surgeon: Truett Mainland, DO;  Location: [facility];  Service: Obstetrics;  Laterality: N/A;  . WISDOM TOOTH EXTRACTION     Social History   Tobacco Use  . Smoking status: Never Smoker  . Smokeless tobacco: Never Used  Substance Use Topics  . Alcohol use: No  Alcohol/week: 0.0 standard drinks    Comment: occasional when not pregnant  . Drug use: No   Family History  Problem Relation Age of Onset  . Hypertension Mother   . Hypertension Father   . Breast cancer Maternal Grandmother        6  . Colon cancer Neg Hx   . Colon polyps Neg Hx   . Kidney disease Neg Hx   . Diabetes Neg Hx   . Esophageal cancer Neg Hx   . Gallbladder disease Neg Hx   . Heart disease Neg Hx   . Asthma Neg Hx   . Cancer Neg Hx   . Stroke Neg Hx   No Known Allergies    Review of Systems  Constitutional: Positive for fatigue. Negative for chills and fever.  HENT:  Negative for congestion and ear pain.   Respiratory: Negative for cough and shortness of breath.   Cardiovascular: Negative for chest pain, palpitations and leg swelling.  Gastrointestinal: Positive for diarrhea (occasionally after eating fatty foods) and nausea. Negative for abdominal pain and constipation.  Endocrine: Negative for polydipsia and polyphagia.  Genitourinary: Positive for dysuria, frequency and vaginal discharge. Negative for difficulty urinating, dyspareunia, enuresis, flank pain, genital sores, hematuria, menstrual problem, pelvic pain, urgency, vaginal bleeding and vaginal pain.  Musculoskeletal: Negative for arthralgias, back pain, gait problem, joint swelling, myalgias, neck pain and neck stiffness.  Neurological: Negative for dizziness, numbness and headaches.  Hematological: Negative for adenopathy. Does not bruise/bleed easily.  Psychiatric/Behavioral: Positive for sleep disturbance and suicidal ideas. Negative for self-injury. The patient is not nervous/anxious.        Objective:   Physical Exam BP 133/84 (BP Location: Right Arm, Patient Position: Sitting, Cuff Size: Normal)   Pulse 66   Temp 99 F (37.2 C) (Oral)   Ht _0  (1.499 m)   Wt 177 lb 9.6 oz (80.6 kg)   LMP  (LMP Unknown)   SpO2 96%   BMI 35.87 kg/m Nurse's notes and vital signs reviewed; LMP 3 weeks ago; recent negative hCG on 12/31/2017 at urgent care visit General-well-nourished, well-developed overweight for height female in no acute distress Neck-supple, no lymphadenopathy, no thyromegaly, no carotid bruit Lungs-clear to auscultation bilaterally Cardiovascular-regular rate and rhythm Abdomen-soft, truncal obesity, patient with complaint of some generalized lower abdominal discomfort but patient also with right upper quadrant discomfort on exam Back-no CVA tenderness Extremities-no edema Psych- normal mood and judgment but patient also seems slightly anxious      Assessment & Plan:  1.  Right upper quadrant pain Patient with some right upper quadrant abdominal pain on examination and complaint of dyspepsia/GERD symptoms.  Patient will have right upper quadrant ultrasound to look for fatty liver or gallstones/gallbladder inflammation as the cause of her symptoms.  In the meantime, patient is encouraged to avoid spicy/greasy foods. - US Abdomen Limited RUQ; Future  2. Vaginal itching Patient with complaint of recurrent issues with vaginal itching.  Patient was seen at [facility] urgent care on 12/31/2017 with similar complaint of vaginal discharge.  Patient had urine cytology which did show presence of bacterial vaginosis.  Patient will have repeat urine cytology at today's visit.  Patient had urinalysis which was unremarkable.  Patient will also have hemoglobin A1c to look for possible elevated blood sugar/prediabetes or diabetes as a contributing factor to vaginal itching.  Patient also counseled to use pH balanced body washes design for the vaginal area to help reduce recurrent bacterial vaginitis.  Patient will be notified  of the results of the urine cytology test and no further treatment is needed based on these results.  Prescription was sent and per ED notes for metronidazole and patient states that she completed antibiotics however it sounds as if this may have been antibiotics from a different encounter and not the recently prescribed metronidazole from her urgent care visit on 12/31/2017. - POCT URINALYSIS DIP ([facility]) - HgB A1c - Urine cytology ancillary only  3. Urinary frequency Patient had a complaint of urinary frequency at today's visit and patient had urinalysis which was negative.  Patient will also have hemoglobin A1c to look for possible diabetes or prediabetes as a contributing factor to her urinary frequency and complaints of recurrent vaginal itching.  Patient's urinalysis was negative/normal.  4. Sleep disturbance Patient with sleep disturbance and she states  that she falls asleep but then reawakens.  Patient does have an unusual work schedule of working from noon until 10:30 PM and states that she gets home at approximately 11 PM.  Patient also has twin 70-year-old boys and she has to get up in the mornings to get them ready for daycare.  Patient is encouraged to try the use of over-the-counter melatonin starting with 3 mg and increasing to 5 mg if needed.  Sleep hygiene was also reviewed with the patient.  5. Fatigue, unspecified type Patient is status post recent office visit as well as several urgent care visit secondary to complaint of fatigue.  Patient denied any anxiety or depression during her visit however patient did have an abnormal PHQ 9 score on depression screening and social worker met with the patient at today's visit to offer resources.  I discussed with the patient that her recent lab work was all normal including CBC, TSH, BMP and vitamin D level.  Patient did during today's visit admit to issues with poor sleep and this may be a factor in her fatigue.  Patient has had recent negative hCG.   6. Gastroesophageal reflux disease, esophagitis presence not specified Patient with complaint of reflux symptoms and will be placed on Pepcid 20 mg twice daily.  Patient admits that she does eat a lot of fast food and patient also with right upper quadrant pain on exam and patient is counseled to avoid spicy/greasy foods and patient is also being sent for right upper quadrant ultrasound in case of gallbladder dysfunction/gallstones or fatty liver is contributing to patient's dyspepsia/reflux symptoms. - famotidine (PEPCID) 20 MG tablet; Take 1 tablet (20 mg total) by mouth 2 (two) times daily. To reduce stomach acid  Dispense: 60 tablet; Refill: 4  *Patient was offered influenza immunization at today's visit which she declined  An After Visit Summary was printed and given to the patient.  Return in about 3 weeks (around 01/27/2018).

## 2018-01-06 NOTE — Patient Instructions (Signed)

## 2018-01-06 NOTE — Telephone Encounter (Signed)
Bacterial vaginosis is positive. This was not treated at the urgent care visit.  Flagyl 500 mg BID x 7 days #14 no refills sent to patients pharmacy of choice.  Attempted to contact patient x1, no answer, no voice mail set up.

## 2018-01-06 NOTE — Progress Notes (Signed)
5.1Pt.is here to establish care and follow-up on fatigue. Pt. Stated she still have vaginal itching and burning when she urinates.

## 2018-01-07 LAB — URINE CYTOLOGY ANCILLARY ONLY
Candida vaginitis: NEGATIVE
Chlamydia: NEGATIVE
Neisseria Gonorrhea: NEGATIVE
Trichomonas: NEGATIVE

## 2018-01-10 ENCOUNTER — Other Ambulatory Visit: Payer: Self-pay | Admitting: Family Medicine

## 2018-01-10 DIAGNOSIS — N760 Acute vaginitis: Principal | ICD-10-CM

## 2018-01-10 DIAGNOSIS — B9689 Other specified bacterial agents as the cause of diseases classified elsewhere: Secondary | ICD-10-CM

## 2018-01-10 MED ORDER — METRONIDAZOLE 500 MG PO TABS: 500.0000 mg | ORAL_TABLET | Freq: Two times a day (BID) | ORAL | 0 refills | Status: DC

## 2018-01-10 NOTE — Progress Notes (Signed)
Patient ID: Sherri [name], female   DOB: [birth year], 32 y.o.   MRN: [id number]   Patient with urine cytology positive for the growth of bacteria that can cause bacterial vaginitis.  Prescription will be sent to patient's pharmacy for metronidazole for treatment.  Patient will be notified regarding results and treatment.

## 2018-01-21 ENCOUNTER — Encounter: Payer: Self-pay | Admitting: Podiatry

## 2018-01-21 ENCOUNTER — Ambulatory Visit (INDEPENDENT_AMBULATORY_CARE_PROVIDER_SITE_OTHER): Payer: PRIVATE HEALTH INSURANCE | Admitting: Podiatry

## 2018-01-21 DIAGNOSIS — M722 Plantar fascial fibromatosis: Secondary | ICD-10-CM | POA: Diagnosis not present

## 2018-01-21 MED ORDER — TRIAMCINOLONE ACETONIDE 10 MG/ML IJ SUSP
10.0000 mg | Freq: Once | INTRAMUSCULAR | Status: AC
  Administered 2018-01-21: 10 mg

## 2018-01-23 NOTE — Progress Notes (Signed)
Subjective:   Patient ID: Sherri [name], female   DOB: 32 y.o.   MRN: [id number]   HPI Patient states that heels were doing real well for a number of months but in the last few weeks of started to hurt again   ROS      Objective:  Physical Exam  Neurovascular status intact with acute inflammation of the plantar heel bilateral with fluid buildup around the medial band     Assessment:  Acute plantar fasciitis bilateral with inflammation fluid around the medial band     Plan:  Sterile prep applied bilateral and injected the plantar fascia 3 mg Kenalog 5 mg Xylocaine instructed on physical therapy

## 2018-02-02 ENCOUNTER — Ambulatory Visit: Payer: PRIVATE HEALTH INSURANCE | Attending: Family Medicine | Admitting: Family Medicine

## 2018-02-02 ENCOUNTER — Encounter: Payer: Self-pay | Admitting: Family Medicine

## 2018-02-02 VITALS — BP 138/72 | HR 77 | Temp 98.1°F | Ht 59.0 in | Wt 176.8 lb

## 2018-02-02 DIAGNOSIS — R1011 Right upper quadrant pain: Secondary | ICD-10-CM

## 2018-02-02 DIAGNOSIS — Z8249 Family history of ischemic heart disease and other diseases of the circulatory system: Secondary | ICD-10-CM | POA: Insufficient documentation

## 2018-02-02 DIAGNOSIS — R1013 Epigastric pain: Secondary | ICD-10-CM

## 2018-02-02 DIAGNOSIS — R300 Dysuria: Secondary | ICD-10-CM | POA: Diagnosis not present

## 2018-02-02 DIAGNOSIS — M797 Fibromyalgia: Secondary | ICD-10-CM | POA: Insufficient documentation

## 2018-02-02 DIAGNOSIS — K219 Gastro-esophageal reflux disease without esophagitis: Secondary | ICD-10-CM | POA: Diagnosis not present

## 2018-02-02 DIAGNOSIS — G479 Sleep disorder, unspecified: Secondary | ICD-10-CM

## 2018-02-02 DIAGNOSIS — Z9889 Other specified postprocedural states: Secondary | ICD-10-CM | POA: Insufficient documentation

## 2018-02-02 DIAGNOSIS — R110 Nausea: Secondary | ICD-10-CM

## 2018-02-02 DIAGNOSIS — I10 Essential (primary) hypertension: Secondary | ICD-10-CM | POA: Insufficient documentation

## 2018-02-02 DIAGNOSIS — R8290 Unspecified abnormal findings in urine: Secondary | ICD-10-CM

## 2018-02-02 LAB — POCT URINALYSIS DIP (CLINITEK)
Bilirubin, UA: NEGATIVE
Glucose, UA: NEGATIVE mg/dL
Leukocytes, UA: NEGATIVE
Nitrite, UA: NEGATIVE
Spec Grav, UA: 1.025
Urobilinogen, UA: 0.2 U/dL
pH, UA: 5.5

## 2018-02-02 MED ORDER — TRAZODONE HCL 50 MG PO TABS: 25.0000 mg | ORAL_TABLET | Freq: Every evening | ORAL | 3 refills | Status: DC | PRN

## 2018-02-02 MED ORDER — OMEPRAZOLE 40 MG PO CPDR: 40.0000 mg | DELAYED_RELEASE_CAPSULE | Freq: Every day | ORAL | 3 refills | Status: DC

## 2018-02-02 MED ORDER — CIPROFLOXACIN HCL 500 MG PO TABS: 500.0000 mg | ORAL_TABLET | Freq: Two times a day (BID) | ORAL | 0 refills | Status: DC

## 2018-02-02 MED FILL — OMEPRAZOLE DR 40 MG CAPSULE: 40 | 30 days supply | Qty: 30 | Fill #0 | Status: TO

## 2018-02-02 MED FILL — traZODone HCL 50 MG TABS: 50 | 30 days supply | Qty: 30 | Fill #0 | Status: TO

## 2018-02-02 MED FILL — CIPROFLOXACIN HCL 500 MG TA: 500 | 3 days supply | Qty: 6 | Fill #0

## 2018-02-02 NOTE — Progress Notes (Signed)
Subjective:    Patient ID: Sherri [name], female    DOB: [birth year], 32 y.o.   MRN: [id number]  HPI       32 yo female seen in follow-up of her visit on 01/06/2018 at which time patient had complaint of ongoing fatigue as well as recurrent issues with urinary frequency and vaginal itching.  At her visit, on exam, patient had some right upper quadrant abdominal pain and complaint of GERD symptoms.  Patient was placed on Pepcid twice daily.  Order was placed for patient to have an abdominal ultrasound.  On review of chart, it does not appear that ultrasound has been done.  Patient also had a social work consult secondary to abnormal depression screening.      At today's visit, patient with complaint of continued acid reflux symptoms and continued right upper quadrant discomfort.  Patient continues to have nausea along with occasional loose stools.  Patient does feel that some foods make her symptoms worse.  Patient states that she has been taking the Pepcid that was prescribed and does not feel that this is really helped with her symptoms.  Patient has not yet had the abdominal ultrasound done in follow-up of her right upper quadrant pain.  Patient also with complaint of burning with urination as well as increased frequency and urgency which has re-occurred over the past 7 to 10 days.  Patient denies any fever or chills.  Patient denies headaches or dizziness.  Patient does continue to have issues with difficulty sleeping.  Patient states that she has tried over-the-counter melatonin with mixed results.  Patient states that sometimes the melatonin does help her to go to sleep and stay asleep but at other times the medication does not really seem to work.  Patient feels that she may have some anxiety but denies depressive symptoms.  Patient denies any suicidal thoughts or ideations.  Patient does feel as if she is overwhelmed at times.           Past Medical History:  Diagnosis Date  . Fibromyalgia    denies today  . Genital herpes   . Hypertension   . 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 Urinary tract infection   . Uterine fibroid    Past Surgical History:  Procedure Laterality Date  . CESAREAN SECTION N/A 06/05/2014   Procedure: CESAREAN SECTION;  Surgeon: Truett Mainland, DO;  Location: [facility];  Service: Obstetrics;  Laterality: N/A;  . WISDOM TOOTH EXTRACTION     Family History  Problem Relation Age of Onset  . Hypertension Mother   . Hypertension Father   . Breast cancer Maternal Grandmother        68  . Colon cancer Neg Hx   . Colon polyps Neg Hx   . Kidney disease Neg Hx   . Diabetes Neg Hx   . Esophageal cancer Neg Hx   . Gallbladder disease Neg Hx   . Heart disease Neg Hx   . Asthma Neg Hx   . Cancer Neg Hx   . Stroke Neg Hx    Social History   Tobacco Use  . Smoking status: Never Smoker  . Smokeless tobacco: Never Used  Substance Use Topics  . Alcohol use: No    Alcohol/week: 0.0 standard drinks    Comment: occasional when not pregnant  . Drug use: No  No Known Allergies   Review of Systems  Constitutional: Positive for fatigue. Negative for chills and fever.  HENT: Negative for sore throat and trouble swallowing.   Respiratory: Negative for cough and shortness of breath.   Cardiovascular: Negative for chest pain, palpitations and leg swelling.  Gastrointestinal: Positive for abdominal pain, diarrhea (Patient reports diarrhea/loose stools and occasional light-colored stools) and nausea.  Genitourinary: Positive for dysuria, frequency and urgency.  Musculoskeletal: Negative for back pain and gait problem.  Neurological: Negative for dizziness and headaches.  Hematological: Negative for adenopathy. Does not bruise/bleed easily.       Objective:   Physical Exam BP 138/72   Pulse 77   Temp 98.1 F (36.7 C) (Oral)   Ht 4\' 11"  (1.499 m)   Wt 176 lb 12.8 oz (80.2 kg)   LMP  (LMP Unknown)   SpO2 100%   BMI 35.71 kg/m  Nurse's notes and vital signs reviewed General-well-nourished, well-developed overweight for height/obese female in no acute distress Lungs-clear to auscultation bilaterally Cardiovascular-regular rate and rhythm Abdomen- soft, patient with abdominal distention, patient with complaint of suprapubic, epigastric and right upper quadrant pain to palpation, no rebound or guarding Back-no CVA tenderness Extremities-no edema Psych-normal mood and judgment     Assessment & Plan:  1. Right upper quadrant pain Patient with continued right upper quadrant pain as well as some nausea and loose stools.  Discussed with patient that she likely has gallstones or gallbladder dysfunction.  Patient has not yet had her abdominal ultrasound but nursing will check to see if this is been scheduled and if not this will be scheduled for the patient and patient will be notified.  Patient should avoid spicy/greasy foods  2. Epigastric pain Patient with complaint of nausea and epigastric pain.  Patient had been previously prescribed Pepcid.  Patient stated to me that the medication was not effective but patient told the CMA that she had not been taking the medication.  New prescription will be provided for omeprazole 40 mg once a day and patient should avoid known trigger foods, avoid spicy/greasy foods as well as avoidance of late night eating. - omeprazole (PRILOSEC) 40 MG capsule; Take 1 capsule (40 mg total) by mouth daily. To reduce stomach acid  Dispense: 30 capsule; Refill: 3  3. Dysuria Patient with complaint of dysuria, urinary frequency and urgency suggestive of urinary tract infection.  Patient will have urinalysis done at today's visit.   - POCT URINALYSIS DIP (CLINITEK)  4. Gastroesophageal reflux disease, esophagitis presence not specified Patient with reflux symptoms, nausea and epigastric discomfort on exam.  Patient will be placed on omeprazole 40 mg daily and should avoid spicy/greasy foods, avoid known  trigger foods and avoid late night eating - omeprazole (PRILOSEC) 40 MG capsule; Take 1 capsule (40 mg total) by mouth daily. To reduce stomach acid  Dispense: 30 capsule; Refill: 3  5. Nausea Patient with complaint of nausea.  Patient is to have a right upper quadrant ultrasound to evaluate for gallstones and patient is being placed on omeprazole. - omeprazole (PRILOSEC) 40 MG capsule; Take 1 capsule (40 mg total) by mouth daily. To reduce stomach acid  Dispense: 30 capsule; Refill: 3  6. Difficulty sleeping Patient reports continued difficulty with sleeping but did try OTC melatonin with mixed results.  Prescription will be sent to pharmacy for trazodone 50 mg for patient to take one half or 1 tablet by mouth at bedtime as needed to help with sleep. - traZODone (DESYREL) 50 MG tablet; Take 0.5-1 tablets (25-50 mg total) by mouth at bedtime as needed for sleep.  Dispense: 30 tablet;  Refill: 3  An After Visit Summary was printed and given to the patient.  Return in about 4 weeks (around 03/02/2018) for abdominal pain/nausea/sleep.

## 2018-02-04 ENCOUNTER — Telehealth: Payer: Self-pay | Admitting: *Deleted

## 2018-02-04 LAB — URINE CULTURE

## 2018-02-04 NOTE — Telephone Encounter (Signed)
Patient verified DOB Patient is aware of no UTI therapy being needed at this time. No further questions at this time.

## 2018-02-04 NOTE — Telephone Encounter (Signed)
-----   Message from Antony Blackbird, MD sent at 02/04/2018 11:58 AM EST ----- Notify patient that her urine culture showed mixed urogenital flora therefore no need for antibiotic therapy for UTI treatment

## 2018-02-09 ENCOUNTER — Ambulatory Visit (HOSPITAL_COMMUNITY): Payer: Self-pay

## 2018-02-09 ENCOUNTER — Ambulatory Visit (HOSPITAL_COMMUNITY)
Admission: RE | Admit: 2018-02-09 | Discharge: 2018-02-09 | Disposition: A | Discharge status: Home / Self Care | Payer: PRIVATE HEALTH INSURANCE | Source: Ambulatory Visit | Attending: Family Medicine | Admitting: Family Medicine

## 2018-02-09 DIAGNOSIS — R110 Nausea: Secondary | ICD-10-CM | POA: Insufficient documentation

## 2018-02-09 DIAGNOSIS — R1011 Right upper quadrant pain: Secondary | ICD-10-CM | POA: Diagnosis present

## 2018-02-11 ENCOUNTER — Telehealth: Payer: Self-pay | Admitting: *Deleted

## 2018-02-11 NOTE — Telephone Encounter (Signed)
Patient verified DOB Patient is aware of [place] being normal. Patient complains of "not feeling like herself". Patient states she is taking OTC vitamin d but still feels very tired and weak. Last set of labs were in September which were normal.

## 2018-02-11 NOTE — Telephone Encounter (Signed)
-----   Message from Antony Blackbird, MD sent at 02/09/2018  7:56 PM EST ----- Please notify patient that her abdominal ultrasound was normal.  No abnormality of the gallbladder

## 2018-02-14 NOTE — Telephone Encounter (Signed)
Patient can make a sooner appointment if there is one available. Can you check her chart for a recent Vitamin D level and if less than 30 then a prescription can be sent in for vitamin D therapy

## 2018-02-26 ENCOUNTER — Encounter (HOSPITAL_COMMUNITY): Payer: Self-pay

## 2018-02-26 ENCOUNTER — Ambulatory Visit (HOSPITAL_COMMUNITY)
Admission: EM | Admit: 2018-02-26 | Discharge: 2018-02-26 | Disposition: A | Discharge status: Home / Self Care | Payer: PRIVATE HEALTH INSURANCE | Attending: Family Medicine | Admitting: Family Medicine

## 2018-02-26 DIAGNOSIS — R300 Dysuria: Secondary | ICD-10-CM | POA: Diagnosis not present

## 2018-02-26 DIAGNOSIS — Z8249 Family history of ischemic heart disease and other diseases of the circulatory system: Secondary | ICD-10-CM | POA: Diagnosis not present

## 2018-02-26 DIAGNOSIS — N898 Other specified noninflammatory disorders of vagina: Secondary | ICD-10-CM | POA: Insufficient documentation

## 2018-02-26 DIAGNOSIS — Z79899 Other long term (current) drug therapy: Secondary | ICD-10-CM | POA: Diagnosis not present

## 2018-02-26 DIAGNOSIS — I10 Essential (primary) hypertension: Secondary | ICD-10-CM | POA: Diagnosis not present

## 2018-02-26 DIAGNOSIS — E669 Obesity, unspecified: Secondary | ICD-10-CM | POA: Insufficient documentation

## 2018-02-26 LAB — POCT URINALYSIS DIP (DEVICE)
Bilirubin Urine: NEGATIVE
Glucose, UA: NEGATIVE mg/dL
Ketones, ur: NEGATIVE mg/dL
Leukocytes, UA: NEGATIVE
Nitrite: NEGATIVE
Protein, ur: NEGATIVE mg/dL
Specific Gravity, Urine: 1.025 (ref 1.005–1.030)
Urobilinogen, UA: 0.2 mg/dL (ref 0.0–1.0)
pH: 6.5 (ref 5.0–8.0)

## 2018-02-26 LAB — POCT PREGNANCY, URINE: Preg Test, Ur: NEGATIVE

## 2018-02-26 MED ORDER — FLUCONAZOLE 150 MG PO TABS: 150.0000 mg | ORAL_TABLET | Freq: Every day | ORAL | 0 refills | Status: DC

## 2018-02-26 MED ORDER — POLYETHYLENE GLYCOL 3350 17 G PO PACK: 17.0000 g | PACK | Freq: Every day | ORAL | 0 refills | Status: DC

## 2018-02-26 MED ORDER — METRONIDAZOLE 500 MG PO TABS: 500.0000 mg | ORAL_TABLET | Freq: Two times a day (BID) | ORAL | 0 refills | Status: DC

## 2018-02-26 NOTE — Discharge Instructions (Addendum)
Your urine was negative for infection/pregnancy. You were treated empirically for BV and yeast. Start diflucan and flagyl as directed. You can take miralax for constipation. Cytology sent, you will be contacted with any positive results that requires further treatment. Refrain from sexual activity and alcohol use for the next 7 days. Monitor for any worsening of symptoms, fever, abdominal pain, nausea, vomiting, to follow up for reevaluation.

## 2018-02-26 NOTE — ED Provider Notes (Signed)
[facility]    CSN: [id number] Arrival date & time: 02/26/18  1113     History   Chief Complaint Chief Complaint  Patient presents with  . Urinary Tract Infection    HPI Sherri [name] is a 32 y.o. female.   32 year old female comes in for 1 week history of urinary symptoms and vaginal discharge. States has dysuria, vaginal discharge, vaginal odor and itching. Has also nosed urine is slightly discolored. She has had some left flank pain. Nausea without vomiting. Denies fever, chills, night sweats. Denies urinary frequency, hematuria. Sexually active with one female partner, consistent condom use. No other birth control use. LMP 02/10/2018     Past Medical History:  Diagnosis Date  . Fibromyalgia    denies today  . Genital herpes   . Hypertension   . 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 Urinary tract infection   . Uterine fibroid     Patient Active Problem List   Diagnosis Date Noted  . Hypertension   . Genital herpes   . Yeast infection 12/03/2016  . Status post cesarean delivery 06/06/2014  . Uterine fibroid 12/25/2013  . Obesity 12/25/2013    Past Surgical History:  Procedure Laterality Date  . CESAREAN SECTION N/A 06/05/2014   Procedure: CESAREAN SECTION;  Surgeon: Truett Mainland, DO;  Location: [facility];  Service: Obstetrics;  Laterality: N/A;  . WISDOM TOOTH EXTRACTION      OB History    Gravida  1   Para  1   Term      Preterm  1   AB      Living  2     SAB      TAB      Ectopic      Multiple  1   Live Births  2            Home Medications    Prior to Admission medications   Medication Sig Start Date End Date Taking? Authorizing Provider  Cetirizine HCl 10 MG CAPS Take 1 capsule (10 mg total) by mouth daily for 10 days. Patient not taking: Reported on 01/06/2018 12/31/17 01/10/18  Loura Halt A, NP  fluconazole (DIFLUCAN) 150 MG tablet Take 1 tablet (150 mg total) by mouth daily. Take second  dose 72 hours later if symptoms still persists. 02/26/18   Tasia Catchings, River Ambrosio V, PA-C  fluticasone (FLONASE) 50 MCG/ACT nasal spray Place 1-2 sprays into both nostrils daily for 7 days. Patient not taking: Reported on 12/15/2017 11/16/17 11/23/17  Wieters, Hallie C, PA-C  fluticasone (FLONASE) 50 MCG/ACT nasal spray Place 1 spray into both nostrils daily. Patient not taking: Reported on 01/06/2018 12/31/17   Loura Halt A, NP  ibuprofen (ADVIL,MOTRIN) 400 MG tablet Take 1 tablet (400 mg total) by mouth every 6 (six) hours as needed. Patient not taking: Reported on 12/15/2017 06/25/17   Augusto Gamble B, NP  ipratropium (ATROVENT) 0.06 % nasal spray Place 2 sprays into both nostrils 4 (four) times daily. Patient not taking: Reported on 12/15/2017 08/05/17   Ok Edwards, PA-C  magic mouthwash w/lidocaine SOLN Take 5 mLs by mouth 3 (three) times daily as needed for mouth pain. Patient not taking: Reported on 12/15/2017 11/20/17   Jacqualine Mau, NP  metroNIDAZOLE (FLAGYL) 500 MG tablet Take 1 tablet (500 mg total) by mouth 2 (two) times daily. 02/26/18   Tasia Catchings, Dela Sweeny V, PA-C  omeprazole (PRILOSEC) 40 MG capsule Take 1 capsule (  40 mg total) by mouth daily. To reduce stomach acid 02/02/18   Fulp, Cammie, MD  ondansetron (ZOFRAN) 4 MG tablet Take 1 tablet (4 mg total) by mouth every 6 (six) hours. Patient not taking: Reported on 01/06/2018 12/31/17   Loura Halt A, NP  polyethylene glycol (MIRALAX) packet Take 17 g by mouth daily. 02/26/18   Tasia Catchings, Zayquan Bogard V, PA-C  traZODone (DESYREL) 50 MG tablet Take 0.5-1 tablets (25-50 mg total) by mouth at bedtime as needed for sleep. 02/02/18   Fulp, Cammie, MD  Vitamin D, Ergocalciferol, (DRISDOL) 1.25 MG (50000 UT) CAPS capsule TAKE 1 CAPSULE ONCE WEEKLY 01/02/18   [provider]    Family History Family History  Problem Relation Age of Onset  . Hypertension Mother   . Hypertension Father   . Breast cancer Maternal Grandmother        87  . Colon cancer Neg Hx   . Colon  polyps Neg Hx   . Kidney disease Neg Hx   . Diabetes Neg Hx   . Esophageal cancer Neg Hx   . Gallbladder disease Neg Hx   . Heart disease Neg Hx   . Asthma Neg Hx   . Cancer Neg Hx   . Stroke Neg Hx     Social History Social History   Tobacco Use  . Smoking status: Never Smoker  . Smokeless tobacco: Never Used  Substance Use Topics  . Alcohol use: No    Alcohol/week: 0.0 standard drinks    Comment: occasional when not pregnant  . Drug use: No     Allergies   Patient has no known allergies.   Review of Systems Review of Systems  Reason unable to perform ROS: See HPI as above.     Physical Exam Triage Vital Signs ED Triage Vitals  Enc Vitals Group     BP 02/26/18 1239 115/75     Pulse Rate 02/26/18 1239 92     Resp --      Temp 02/26/18 1239 98.5 F (36.9 C)     Temp Source 02/26/18 1239 Oral     SpO2 02/26/18 1239 97 %     Weight --      Height --      Head Circumference --      Peak Flow --      Pain Score 02/26/18 1242 6     Pain Loc --      Pain Edu? --      Excl. in [place]? --    No data found.  Updated Vital Signs BP 115/75 (BP Location: Left Arm)   Pulse 92   Temp 98.5 F (36.9 C) (Oral)   LMP 01/15/2018   SpO2 97%   Physical Exam Constitutional:      General: She is not in acute distress.    Appearance: She is well-developed. She is not ill-appearing, toxic-appearing or diaphoretic.  HENT:     Head: Normocephalic and atraumatic.  Eyes:     Conjunctiva/sclera: Conjunctivae normal.     Pupils: Pupils are equal, round, and reactive to light.  Cardiovascular:     Rate and Rhythm: Normal rate and regular rhythm.     Heart sounds: Normal heart sounds. No murmur. No friction rub. No gallop.   Pulmonary:     Effort: Pulmonary effort is normal. No respiratory distress.     Breath sounds: Normal breath sounds. No stridor. No wheezing, rhonchi or rales.  Abdominal:     General: Bowel sounds  are normal.     Palpations: Abdomen is soft. There is  no mass.     Tenderness: There is no abdominal tenderness. There is no right CVA tenderness, left CVA tenderness, guarding or rebound.  Musculoskeletal:     Comments: Tenderness to palpation diffusely of left lumbar region. No obvious tenderness to palpation of spinous processes. Full ROM of back.   Skin:    General: Skin is warm and dry.  Neurological:     Mental Status: She is alert and oriented to person, place, and time.  Psychiatric:        Behavior: Behavior normal.        Judgment: Judgment normal.      UC Treatments / Results  Labs (all labs ordered are listed, but only abnormal results are displayed) Labs Reviewed  POCT URINALYSIS DIP (DEVICE) - Abnormal; Notable for the following components:      Result Value   Hgb urine dipstick TRACE (*)    All other components within normal limits  POC URINE PREG, ED  POCT PREGNANCY, URINE  CERVICOVAGINAL ANCILLARY ONLY    EKG None  Radiology No results found.  Procedures Procedures (including critical care time)  Medications Ordered in UC Medications - No data to display  Initial Impression / Assessment and Plan / UC Course  I have reviewed the triage vital signs and the nursing notes.  Pertinent labs & imaging results that were available during my care of the patient were reviewed by me and considered in my medical decision making (see chart for details).     Urine negative for infection/pregnancy. Will cover for vaginitis with diflucan and flagyl. No obvious CVA tenderness. Cytology sent. Return precautions given.   Final Clinical Impressions(s) / UC Diagnoses   Final diagnoses:  Vaginal discharge    ED Prescriptions    Medication Sig Dispense Auth. Provider   fluconazole (DIFLUCAN) 150 MG tablet Take 1 tablet (150 mg total) by mouth daily. Take second dose 72 hours later if symptoms still persists. 2 tablet Nalea Salce V, PA-C   metroNIDAZOLE (FLAGYL) 500 MG tablet Take 1 tablet (500 mg total) by mouth 2 (two)  times daily. 14 tablet Hamlet Lasecki V, PA-C   polyethylene glycol (MIRALAX) packet Take 17 g by mouth daily. [address] each Tobin Chad, PA-C 02/26/18 1328

## 2018-02-26 NOTE — ED Triage Notes (Signed)
Pt presents with urinary tract symptoms;burning with urination, discolored urine, and flank pain.

## 2018-02-28 LAB — CERVICOVAGINAL ANCILLARY ONLY
Chlamydia: NEGATIVE
Neisseria Gonorrhea: NEGATIVE
Trichomonas: NEGATIVE

## 2018-03-04 ENCOUNTER — Ambulatory Visit: Payer: Self-pay | Admitting: Family Medicine

## 2018-03-07 ENCOUNTER — Encounter: Payer: Self-pay | Admitting: Family Medicine

## 2018-03-07 ENCOUNTER — Ambulatory Visit: Payer: PRIVATE HEALTH INSURANCE | Attending: Family Medicine | Admitting: Family Medicine

## 2018-03-07 VITALS — BP 115/72 | HR 77 | Temp 98.6°F | Resp 18 | Ht 59.0 in | Wt 180.0 lb

## 2018-03-07 DIAGNOSIS — R350 Frequency of micturition: Secondary | ICD-10-CM | POA: Diagnosis not present

## 2018-03-07 DIAGNOSIS — R300 Dysuria: Secondary | ICD-10-CM | POA: Diagnosis not present

## 2018-03-07 DIAGNOSIS — R1013 Epigastric pain: Secondary | ICD-10-CM

## 2018-03-07 LAB — POCT URINALYSIS DIP (CLINITEK)
Bilirubin, UA: NEGATIVE
Glucose, UA: NEGATIVE mg/dL
Ketones, POC UA: NEGATIVE mg/dL
Leukocytes, UA: NEGATIVE
Nitrite, UA: NEGATIVE
Spec Grav, UA: 1.02
Urobilinogen, UA: 0.2 U/dL
pH, UA: 6.5

## 2018-03-07 MED ORDER — PHENAZOPYRIDINE HCL 100 MG PO TABS: 100.0000 mg | ORAL_TABLET | Freq: Three times a day (TID) | ORAL | 0 refills | Status: DC

## 2018-03-07 MED ORDER — ESOMEPRAZOLE MAGNESIUM 40 MG PO CPDR: 40.0000 mg | DELAYED_RELEASE_CAPSULE | Freq: Every day | ORAL | 5 refills | Status: DC

## 2018-03-07 MED FILL — ESOMEPRAZOLE MAG DR 40 MG C: 40 | 30 days supply | Qty: 30 | Fill #0

## 2018-03-07 NOTE — Progress Notes (Signed)
Subjective:    Patient ID: Sherri [name], female    DOB: 1985/10/22, 33 y.o.   MRN: [id number]  HPI       33 yo female who presents secondary to the complaint of continued issues with burning with urination.  Patient also feels as if she has having to urinate frequently.  Patient denies fever chills.  Patient does have some mild nausea.  Patient also with complaint of some left-sided back pain.  Patient states that she has discomfort across her lower abdomen.  Patient states that the lower abdominal discomfort is dull and sometimes slightly crampy.  Patient also reports that she is taking medication, omeprazole to decrease stomach acid but still has some upper abdominal discomfort along with nausea.  Patient denies constipation or diarrhea.  Patient denies any current possibility of pregnancy.  Patient continues to have daily fatigue.   Past Medical History:  Diagnosis Date  . Fibromyalgia    denies today  . Genital herpes   . Hypertension   . 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 Urinary tract infection   . Uterine fibroid    Past Surgical History:  Procedure Laterality Date  . CESAREAN SECTION N/A 06/05/2014   Procedure: CESAREAN SECTION;  Surgeon: Truett Mainland, DO;  Location: [facility];  Service: Obstetrics;  Laterality: N/A;  . WISDOM TOOTH EXTRACTION     Family History  Problem Relation Age of Onset  . Hypertension Mother   . Hypertension Father   . Breast cancer Maternal Grandmother        24  . Colon cancer Neg Hx   . Colon polyps Neg Hx   . Kidney disease Neg Hx   . Diabetes Neg Hx   . Esophageal cancer Neg Hx   . Gallbladder disease Neg Hx   . Heart disease Neg Hx   . Asthma Neg Hx   . Cancer Neg Hx   . Stroke Neg Hx    Social History   Tobacco Use  . Smoking status: Never Smoker  . Smokeless tobacco: Never Used  Substance Use Topics  . Alcohol use: No    Alcohol/week: 0.0 standard drinks    Comment: occasional when not pregnant  .  Drug use: No  No Known Allergies   Review of Systems  Constitutional: Positive for fatigue. Negative for chills and fever.  HENT: Negative for sore throat and trouble swallowing.   Respiratory: Negative for cough and shortness of breath.   Cardiovascular: Negative for chest pain, palpitations and leg swelling.  Gastrointestinal: Positive for abdominal pain and nausea. Negative for blood in stool, constipation and diarrhea.  Genitourinary: Positive for dysuria, flank pain and frequency. Negative for genital sores.  Musculoskeletal: Positive for arthralgias, back pain and myalgias.  Neurological: Negative for dizziness and headaches.  Hematological: Negative for adenopathy. Does not bruise/bleed easily.       Objective:   Physical Exam BP 115/72 (BP Location: Left Arm, Patient Position: Sitting, Cuff Size: Large)   Pulse 77   Temp 98.6 F (37 C) (Oral)   Resp 18   Ht 4\' 11"  (1.499 m)   Wt 180 lb (81.6 kg)   SpO2 97%   BMI 36.36 kg/m Nurse's notes and vital signs reviewed General-well-nourished, well-developed overweight for height/obese, short statured female in no acute distress Lungs-clear auscultation bilaterally Cardiovascular-regular rate and rhythm Abdomen-truncal obesity, soft, patient with complaint of epigastric discomfort with palpation, no rebound or guarding, patient with complaint of discomfort with  suprapubic palpation, no rebound or guarding Back- patient with complaint of left-sided CVA tenderness, otherwise mild thoracolumbar paraspinous spasm      Assessment & Plan:  1. Dysuria Patient with continued complaint of dysuria and urinary frequency.  Patient will be referred to urology for further evaluation and treatment.  Patient did have trace hematuria on today's urinalysis.  Urine will be sent for culture.  Patient is being placed on Pyridium 100 mg 3 times daily x2 days and patient is encouraged to remain well-hydrated.  Patient was made aware that this  medication may cause a change in the color of her urine with darkening of the urine and possibly orange color.  Educational information given on interstitial cystitis as part of AVS - Ambulatory referral to Urology - POCT URINALYSIS DIP (CLINITEK) - phenazopyridine (PYRIDIUM) 100 MG tablet; Take 1 tablet (100 mg total) by mouth 3 (three) times daily. For 2 days to decrease pain with urination  Dispense: 6 tablet; Refill: 0 - Urine Culture  2. Urinary frequency Patient with continued complaint of urinary frequency.  Will check BMP to look for abnormalities in glucose or creatinine.  Patient did have hemoglobin A1c in November of last year which was normal at 5.1.  Patient had urinalysis to look for possible urinary tract infection due to her dysuria and urinary frequency.  Patient is being referred to urology for further evaluation and treatment - Ambulatory referral to Urology - POCT URINALYSIS DIP (CLINITEK) - Basic Metabolic Panel - Urine Culture  3. Epigastric discomfort Patient with complaint of epigastric discomfort and nausea.  Patient has been on omeprazole but this will be changed to Nexium 40 mg once daily.  Patient is encouraged to avoid known trigger foods as well as avoidance of late night eating.  Patient will have BMP, lipase and CBC in follow-up of her epigastric discomfort.  Patient will be reevaluated in 8 weeks and if she has continued epigastric tenderness, she may need GI referral or testing for H. pylori. - esomeprazole (NEXIUM) 40 MG capsule; Take 1 capsule (40 mg total) by mouth daily. To reduce stomach acid  Dispense: 30 capsule; Refill: 5 - Basic Metabolic Panel - Lipase - CBC with Differential  An After Visit Summary was printed and given to the patient.  Allergies as of 03/07/2018   No Known Allergies     Medication List       Accurate as of March 07, 2018 10:13 AM. Always use your most recent med list.        esomeprazole 40 MG capsule Commonly known as:   NEXIUM Take 1 capsule (40 mg total) by mouth daily. To reduce stomach acid   fluticasone 50 MCG/ACT nasal spray Commonly known as:  FLONASE Place 1 spray into both nostrils daily.   ibuprofen 400 MG tablet Commonly known as:  ADVIL,MOTRIN Take 1 tablet (400 mg total) by mouth every 6 (six) hours as needed.   ondansetron 4 MG tablet Commonly known as:  ZOFRAN Take 1 tablet (4 mg total) by mouth every 6 (six) hours.   phenazopyridine 100 MG tablet Commonly known as:  PYRIDIUM Take 1 tablet (100 mg total) by mouth 3 (three) times daily. For 2 days to decrease pain with urination   polyethylene glycol packet Commonly known as:  MIRALAX Take 17 g by mouth daily.   traZODone 50 MG tablet Commonly known as:  DESYREL Take 0.5-1 tablets (25-50 mg total) by mouth at bedtime as needed for sleep.   Vitamin D (  Ergocalciferol) 1.25 MG (50000 UT) Caps capsule Commonly known as:  DRISDOL TAKE 1 CAPSULE ONCE WEEKLY       Return in about 8 weeks (around 05/02/2018) for epigastric discomfort.

## 2018-03-07 NOTE — Patient Instructions (Signed)
Interstitial Cystitis    Interstitial cystitis is inflammation of the bladder. This may cause pain in the bladder area as well as a frequent and urgent need to urinate. The bladder is a hollow organ in the lower part of the abdomen. It stores urine after the urine is made in the kidneys.  The severity of interstitial cystitis can vary from person to person. You may have flare-ups, and then your symptoms may go away for a while. For many people, it becomes a long-term (chronic) problem.  What are the causes?  The cause of this condition is not known.  What increases the risk?  The following factors may make you more likely to develop this condition:  · You are female.  · You have fibromyalgia.  · You have irritable bowel syndrome (IBS).  · You have endometriosis.  This condition may be aggravated by:  · Stress.  · Smoking.  · Spicy foods.  What are the signs or symptoms?  Symptoms of interstitial cystitis vary, and they can change over time. Symptoms may include:  · Discomfort or pain in the bladder area, which is in the lower abdomen. Pain can range from mild to severe. The pain may change in intensity as the bladder fills with urine or as it empties.  · Pain in the pelvic area, between the hip bones.  · An urgent need to urinate.  · Frequent urination.  · Pain during urination.  · Pain during sex.  · Blood in the urine.  For women, symptoms often get worse during menstruation.  How is this diagnosed?  This condition is diagnosed based on your symptoms, your medical history, and a physical exam. You may have tests to rule out other conditions, such as:  · Urine tests.  · Cystoscopy. For this test, a tool similar to a very thin telescope is used to look into your bladder.  · Biopsy. This involves taking a sample of tissue from the bladder to be examined under a microscope.  How is this treated?  There is no cure for this condition, but treatment can help you control your symptoms. Work closely with your health care  provider to find the most effective treatments for you. Treatment options may include:  · Medicines to relieve pain and reduce how often you feel the need to urinate.  · Learning ways to control when you urinate (bladder training).  · Lifestyle changes, such as changing your diet or taking steps to control stress.  · Using a device that provides electrical stimulation to your nerves, which can relieve pain (neuromodulation therapy). The device is placed on your back, where it blocks the nerves that cause you to feel pain in your bladder area.  · A procedure that stretches your bladder by filling it with air or fluid.  · Surgery. This is rare. It is only done for extreme cases, if other treatments do not help.  Follow these instructions at home:  Bladder training    · Use bladder training techniques as directed. Techniques may include:  ? Urinating at scheduled times.  ? Training yourself to delay urination.  ? Doing exercises (Kegel exercises) to strengthen the muscles that control urine flow.  · Keep a bladder diary.  ? Write down the times that you urinate and any symptoms that you have. This can help you find out which foods, liquids, or activities make your symptoms worse.  ? Use your bladder diary to schedule bathroom trips. If you are away   from home, plan to be near a bathroom at each of your scheduled times.  · Make sure that you urinate just before you leave the house and just before you go to bed.  Eating and drinking  · Make dietary changes as recommended by your health care provider. You may need to avoid:  ? Spicy foods.  ? Foods that contain a lot of potassium.  · Limit your intake of beverages that make you need to urinate. These include:  ? Caffeinated beverages like soda, coffee, and tea.  ? Alcohol.  General instructions  · Take over-the-counter and prescription medicines only as told by your health care provider.  · Do not drink alcohol.  · You can try a warm or cool compress over your bladder for  comfort.  · Avoid wearing tight clothing.  · Do not use any products that contain nicotine or tobacco, such as cigarettes and e-cigarettes. If you need help quitting, ask your health care provider.  · Keep all follow-up visits as told by your health care provider. This is important.  Contact a health care provider if you have:  · Symptoms that do not get better with treatment.  · Pain or discomfort that gets worse.  · More frequent urges to urinate.  · A fever.  Get help right away if:  · You have no control over when you urinate.  Summary  · Interstitial cystitis is inflammation of the bladder.  · This condition may cause pain in the bladder area as well as a frequent and urgent need to urinate.  · You may have flare-ups of the condition, and then it may go away for a while. For many people, it becomes a long-term (chronic) problem.  · There is no cure for interstitial cystitis, but treatment methods are available to control your symptoms.  This information is not intended to replace advice given to you by your health care provider. Make sure you discuss any questions you have with your health care provider.  Document Released: 10/18/2003 Document Revised: 01/11/2017 Document Reviewed: 01/11/2017  Elsevier Interactive Patient Education © 2019 [employer]

## 2018-03-08 LAB — CBC WITH DIFFERENTIAL/PLATELET
Basophils Absolute: 0.1 x10E3/uL (ref 0.0–0.2)
Basos: 1 %
EOS (ABSOLUTE): 0.2 x10E3/uL (ref 0.0–0.4)
Eos: 3 %
Hematocrit: 36.5 % (ref 34.0–46.6)
Hemoglobin: 12.1 g/dL (ref 11.1–15.9)
Immature Grans (Abs): 0 x10E3/uL (ref 0.0–0.1)
Immature Granulocytes: 0 %
Lymphocytes Absolute: 2.1 x10E3/uL (ref 0.7–3.1)
Lymphs: 30 %
MCH: 28.1 pg (ref 26.6–33.0)
MCHC: 33.2 g/dL (ref 31.5–35.7)
MCV: 85 fL (ref 79–97)
Monocytes Absolute: 0.7 x10E3/uL (ref 0.1–0.9)
Monocytes: 9 %
Neutrophils Absolute: 4.1 x10E3/uL (ref 1.4–7.0)
Neutrophils: 57 %
Platelets: 290 x10E3/uL (ref 150–450)
RBC: 4.3 x10E6/uL (ref 3.77–5.28)
RDW: 14.2 % (ref 11.7–15.4)
WBC: 7.2 x10E3/uL (ref 3.4–10.8)

## 2018-03-08 LAB — BASIC METABOLIC PANEL WITH GFR
BUN/Creatinine Ratio: 14 (ref 9–23)
BUN: 11 mg/dL (ref 6–20)
CO2: 22 mmol/L (ref 20–29)
Calcium: 9.5 mg/dL (ref 8.7–10.2)
Chloride: 106 mmol/L (ref 96–106)
Creatinine, Ser: 0.8 mg/dL (ref 0.57–1.00)
GFR calc Af Amer: 114 mL/min/1.73
GFR calc non Af Amer: 99 mL/min/1.73
Glucose: 87 mg/dL (ref 65–99)
Potassium: 4 mmol/L (ref 3.5–5.2)
Sodium: 142 mmol/L (ref 134–144)

## 2018-03-08 LAB — LIPASE: Lipase: 30 U/L (ref 14–72)

## 2018-03-09 LAB — URINE CULTURE

## 2018-03-13 ENCOUNTER — Encounter (HOSPITAL_COMMUNITY): Payer: Self-pay | Admitting: *Deleted

## 2018-03-13 ENCOUNTER — Other Ambulatory Visit: Payer: Self-pay

## 2018-03-13 ENCOUNTER — Ambulatory Visit (HOSPITAL_COMMUNITY)
Admission: EM | Admit: 2018-03-13 | Discharge: 2018-03-13 | Disposition: A | Discharge status: Home / Self Care | Payer: PRIVATE HEALTH INSURANCE | Attending: Family Medicine | Admitting: Family Medicine

## 2018-03-13 DIAGNOSIS — J0110 Acute frontal sinusitis, unspecified: Secondary | ICD-10-CM

## 2018-03-13 MED ORDER — DEXAMETHASONE SODIUM PHOSPHATE 10 MG/ML IJ SOLN
INTRAMUSCULAR | Status: AC
  Filled 2018-03-13: qty 1

## 2018-03-13 MED ORDER — CETIRIZINE-PSEUDOEPHEDRINE ER 5-120 MG PO TB12: 1.0000 | ORAL_TABLET | Freq: Every day | ORAL | 0 refills | Status: DC

## 2018-03-13 MED ORDER — DEXAMETHASONE SODIUM PHOSPHATE 10 MG/ML IJ SOLN
10.0000 mg | Freq: Once | INTRAMUSCULAR | Status: AC
  Administered 2018-03-13: 10 mg via INTRAMUSCULAR

## 2018-03-13 NOTE — ED Triage Notes (Signed)
C/O sinus pressure and congestion x 3 days without relief from OTC meds.

## 2018-03-13 NOTE — Discharge Instructions (Signed)
Dexamethasone given here in the clinic for nasal swelling and inflammation. Zyrtec-D and Flonase nasal spray for treatment Follow up as needed for continued or worsening symptoms

## 2018-03-13 NOTE — ED Provider Notes (Signed)
[facility]    CSN: [id number] Arrival date & time: 03/13/18  1001     History   Chief Complaint Chief Complaint  Patient presents with  . Facial Pain  . Nasal Congestion    HPI Sherri [name] is a 33 y.o. female.   Is a 33 year old female that presents with 3 days of facial pain, nasal congestion, runny nose.  Symptoms of been constant and remain the same.  She has been taking Claritin and Flonase nasal spray for her symptoms without much relief.  She denies any associated cough, congestion, fever. No recent sick contact.   ROS per HPI      Past Medical History:  Diagnosis Date  . Fibromyalgia    denies today  . Genital herpes   . Hypertension    gestational  . 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 Urinary tract infection   . Uterine fibroid     Patient Active Problem List   Diagnosis Date Noted  . Hypertension   . Genital herpes   . Yeast infection 12/03/2016  . Status post cesarean delivery 06/06/2014  . Uterine fibroid 12/25/2013  . Obesity 12/25/2013    Past Surgical History:  Procedure Laterality Date  . CESAREAN SECTION N/A 06/05/2014   Procedure: CESAREAN SECTION;  Surgeon: Truett Mainland, DO;  Location: [facility];  Service: Obstetrics;  Laterality: N/A;  . WISDOM TOOTH EXTRACTION      OB History    Gravida  1   Para  1   Term      Preterm  1   AB      Living  2     SAB      TAB      Ectopic      Multiple  1   Live Births  2            Home Medications    Prior to Admission medications   Medication Sig Start Date End Date Taking? Authorizing Provider  Vitamin D, Ergocalciferol, (DRISDOL) 1.25 MG (50000 UT) CAPS capsule TAKE 1 CAPSULE ONCE WEEKLY 01/02/18  Yes [provider]  cetirizine-pseudoephedrine (ZYRTEC-D) 5-120 MG tablet Take 1 tablet by mouth daily. 03/13/18   Loura Halt A, NP  esomeprazole (NEXIUM) 40 MG capsule Take 1 capsule (40 mg total) by mouth daily. To reduce  stomach acid 03/07/18   Fulp, Cammie, MD  fluticasone (FLONASE) 50 MCG/ACT nasal spray Place 1 spray into both nostrils daily. 12/31/17   Loura Halt A, NP  ibuprofen (ADVIL,MOTRIN) 400 MG tablet Take 1 tablet (400 mg total) by mouth every 6 (six) hours as needed. 06/25/17   Zigmund Gottron, NP  ondansetron (ZOFRAN) 4 MG tablet Take 1 tablet (4 mg total) by mouth every 6 (six) hours. Patient not taking: Reported on 01/06/2018 12/31/17   Loura Halt A, NP  phenazopyridine (PYRIDIUM) 100 MG tablet Take 1 tablet (100 mg total) by mouth 3 (three) times daily. For 2 days to decrease pain with urination 03/07/18   Fulp, Cammie, MD  polyethylene glycol (MIRALAX) packet Take 17 g by mouth daily. 02/26/18   Tasia Catchings, Amy V, PA-C  traZODone (DESYREL) 50 MG tablet Take 0.5-1 tablets (25-50 mg total) by mouth at bedtime as needed for sleep. 02/02/18   Antony Blackbird, MD    Family History Family History  Problem Relation Age of Onset  . Hypertension Mother   . Hypertension Father   . Breast cancer Maternal Grandmother  70  . Colon cancer Neg Hx   . Colon polyps Neg Hx   . Kidney disease Neg Hx   . Diabetes Neg Hx   . Esophageal cancer Neg Hx   . Gallbladder disease Neg Hx   . Heart disease Neg Hx   . Asthma Neg Hx   . Cancer Neg Hx   . Stroke Neg Hx     Social History Social History   Tobacco Use  . Smoking status: Never Smoker  . Smokeless tobacco: Never Used  Substance Use Topics  . Alcohol use: No  . Drug use: No     Allergies   Patient has no known allergies.   Review of Systems Review of Systems   Physical Exam Triage Vital Signs ED Triage Vitals  Enc Vitals Group     BP 03/13/18 1013 (!) 159/97     Pulse Rate 03/13/18 1013 (!) 102     Resp 03/13/18 1013 16     Temp 03/13/18 1013 99 F (37.2 C)     Temp Source 03/13/18 1013 Oral     SpO2 03/13/18 1013 100 %     Weight --      Height --      Head Circumference --      Peak Flow --      Pain Score 03/13/18 1019 9     Pain  Loc --      Pain Edu? --      Excl. in [place]? --    No data found.  Updated Vital Signs BP (!) 159/97 (BP Location: Right Arm)   Pulse (!) 102   Temp 99 F (37.2 C) (Oral)   Resp 16   LMP 03/06/2018 (Approximate)   SpO2 100%   Visual Acuity Right Eye Distance:   Left Eye Distance:   Bilateral Distance:    Right Eye Near:   Left Eye Near:    Bilateral Near:     Physical Exam Vitals signs and nursing note reviewed.  Constitutional:      General: She is not in acute distress.    Appearance: Normal appearance. She is not ill-appearing, toxic-appearing or diaphoretic.  HENT:     Head: Normocephalic and atraumatic.     Right Ear: Tympanic membrane, ear canal and external ear normal.     Left Ear: Tympanic membrane, ear canal and external ear normal.     Nose: Congestion and rhinorrhea present.     Right Turbinates: Swollen.     Right Sinus: Maxillary sinus tenderness and frontal sinus tenderness present.     Left Sinus: Maxillary sinus tenderness and frontal sinus tenderness present.  Eyes:     Conjunctiva/sclera: Conjunctivae normal.  Neck:     Musculoskeletal: Normal range of motion.  Cardiovascular:     Rate and Rhythm: Normal rate and regular rhythm.  Pulmonary:     Effort: Pulmonary effort is normal.     Breath sounds: Normal breath sounds.  Musculoskeletal: Normal range of motion.  Lymphadenopathy:     Cervical: No cervical adenopathy.  Skin:    General: Skin is warm and dry.  Neurological:     Mental Status: She is alert.  Psychiatric:        Mood and Affect: Mood normal.      UC Treatments / Results  Labs (all labs ordered are listed, but only abnormal results are displayed) Labs Reviewed - No data to display  EKG None  Radiology No results found.  Procedures Procedures (  including critical care time)  Medications Ordered in UC Medications  dexamethasone (DECADRON) injection 10 mg (has no administration in time range)    Initial Impression /  Assessment and Plan / UC Course  I have reviewed the triage vital signs and the nursing notes.  Pertinent labs & imaging results that were available during my care of the patient were reviewed by me and considered in my medical decision making (see chart for details).     Viral sinusitis We will treat with dexamethasone injection in clinic She can start doing Zyrtec-D for congestion, runny nose Follow up as needed for continued or worsening symptoms  Final Clinical Impressions(s) / UC Diagnoses   Final diagnoses:  Acute frontal sinusitis, recurrence not specified     Discharge Instructions     Dexamethasone given here in the clinic for nasal swelling and inflammation. Zyrtec-D and Flonase nasal spray for treatment Follow up as needed for continued or worsening symptoms     ED Prescriptions    Medication Sig Dispense Auth. Provider   cetirizine-pseudoephedrine (ZYRTEC-D) 5-120 MG tablet Take 1 tablet by mouth daily. 30 tablet Loura Halt A, NP     Controlled Substance Prescriptions [place] Controlled Substance Registry consulted? Not Applicable   Orvan July, NP 03/13/18 1041

## 2018-03-15 ENCOUNTER — Telehealth: Payer: Self-pay | Admitting: *Deleted

## 2018-03-15 NOTE — Telephone Encounter (Signed)
-----   Message from Antony Blackbird, MD sent at 03/08/2018  5:25 PM EST ----- Please notify patient that her BMP, lipase and CBC

## 2018-03-15 NOTE — Telephone Encounter (Signed)
Patient verified DOB Patient is aware of blood work being normal and urine being normal. Patient complained of chest pain with SOB since the weekend. Patient was advised to report to the ED asap. Patient was leaving work and reporting to the emergency room.

## 2018-03-16 ENCOUNTER — Emergency Department (HOSPITAL_COMMUNITY)
Admission: EM | Admit: 2018-03-16 | Discharge: 2018-03-16 | Disposition: A | Discharge status: Home / Self Care | Payer: PRIVATE HEALTH INSURANCE | Attending: Emergency Medicine | Admitting: Emergency Medicine

## 2018-03-16 ENCOUNTER — Other Ambulatory Visit: Payer: Self-pay

## 2018-03-16 ENCOUNTER — Encounter (HOSPITAL_COMMUNITY): Payer: Self-pay

## 2018-03-16 ENCOUNTER — Emergency Department (HOSPITAL_COMMUNITY): Payer: PRIVATE HEALTH INSURANCE

## 2018-03-16 DIAGNOSIS — I10 Essential (primary) hypertension: Secondary | ICD-10-CM | POA: Insufficient documentation

## 2018-03-16 DIAGNOSIS — B9789 Other viral agents as the cause of diseases classified elsewhere: Secondary | ICD-10-CM | POA: Insufficient documentation

## 2018-03-16 DIAGNOSIS — J069 Acute upper respiratory infection, unspecified: Secondary | ICD-10-CM | POA: Insufficient documentation

## 2018-03-16 DIAGNOSIS — Z79899 Other long term (current) drug therapy: Secondary | ICD-10-CM | POA: Insufficient documentation

## 2018-03-16 MED ORDER — PREDNISONE 20 MG PO TABS: 40.0000 mg | ORAL_TABLET | Freq: Every day | ORAL | 0 refills | Status: AC

## 2018-03-16 MED ORDER — BENZONATATE 100 MG PO CAPS: 100.0000 mg | ORAL_CAPSULE | Freq: Three times a day (TID) | ORAL | 0 refills | Status: DC

## 2018-03-16 NOTE — ED Triage Notes (Signed)
Patient c/o a non productive cough x 4 days.

## 2018-03-16 NOTE — Discharge Instructions (Addendum)
You can take Tylenol or Ibuprofen as directed for pain. You can alternate Tylenol and Ibuprofen every 4 hours. If you take Tylenol at 1pm, then you can take Ibuprofen at 5pm. Then you can take Tylenol again at 9pm.   Continue taking Zyrtec and using Flonase.  You can take Tessalon Perles or over-the-counter Delsym cough syrup to help with cough.  Take Prednisone as directed as this will help with cough.   Make sure you are staying hydrated drink plenty of fluids.  Return to emergency department for any high fevers, vomiting, difficulty breathing or any other worsening or concerning symptoms.

## 2018-03-16 NOTE — ED Provider Notes (Signed)
[facility] DEPT Provider Note   CSN: [id number] Arrival date & time: 03/16/18  6568     History   Chief Complaint Chief Complaint  Patient presents with  . Cough    HPI Sherri [name] is a 33 y.o. female past history of fibromyalgia, hypertension, UTI who presents for evaluation of 4 days of dry cough, nasal congestion, rhinorrhea, chest congestion.  Patient reports that she feels like her cough has settled in her chest.  She states that she has some chest soreness, tightness with fits of coughing.  No chest pain at rest.  No difficulty breathing.  Patient reports that she was recently seen at urgent care 03/13/2018 evaluation of same symptoms.  At the time, it was thought to be a viral URI.  She was told to use Flonase and Zyrtec which she states she has been using but is not been helping much.  Patient does report that several people around her at work have been sick with similar symptoms.  Patient denies any history of asthma.  She does not smoke or vape.  Patient denies any fevers. She denies any OCP use, recent immobilization, prior history of DVT/PE, recent surgery, leg swelling, or long travel.  The history is provided by the patient.    Past Medical History:  Diagnosis Date  . Fibromyalgia    denies today  . Genital herpes   . Hypertension    gestational  . 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 Urinary tract infection   . Uterine fibroid     Patient Active Problem List   Diagnosis Date Noted  . Hypertension   . Genital herpes   . Yeast infection 12/03/2016  . Status post cesarean delivery 06/06/2014  . Uterine fibroid 12/25/2013  . Obesity 12/25/2013    Past Surgical History:  Procedure Laterality Date  . CESAREAN SECTION N/A 06/05/2014   Procedure: CESAREAN SECTION;  Surgeon: Truett Mainland, DO;  Location: [facility];  Service: Obstetrics;  Laterality: N/A;  . WISDOM TOOTH EXTRACTION       OB History    Gravida  1   Para  1   Term      Preterm  1   AB      Living  2     SAB      TAB      Ectopic      Multiple  1   Live Births  2            Home Medications    Prior to Admission medications   Medication Sig Start Date End Date Taking? Authorizing Provider  benzonatate (TESSALON) 100 MG capsule Take 1 capsule (100 mg total) by mouth every 8 (eight) hours. 03/16/18   Volanda Napoleon, PA-C  cetirizine-pseudoephedrine (ZYRTEC-D) 5-120 MG tablet Take 1 tablet by mouth daily. 03/13/18   Loura Halt A, NP  esomeprazole (NEXIUM) 40 MG capsule Take 1 capsule (40 mg total) by mouth daily. To reduce stomach acid 03/07/18   Fulp, Cammie, MD  fluticasone (FLONASE) 50 MCG/ACT nasal spray Place 1 spray into both nostrils daily. 12/31/17   Loura Halt A, NP  ibuprofen (ADVIL,MOTRIN) 400 MG tablet Take 1 tablet (400 mg total) by mouth every 6 (six) hours as needed. 06/25/17   Zigmund Gottron, NP  ondansetron (ZOFRAN) 4 MG tablet Take 1 tablet (4 mg total) by mouth every 6 (six) hours. Patient not taking: Reported on 01/06/2018 12/31/17  Loura Halt A, NP  phenazopyridine (PYRIDIUM) 100 MG tablet Take 1 tablet (100 mg total) by mouth 3 (three) times daily. For 2 days to decrease pain with urination 03/07/18   Fulp, Cammie, MD  polyethylene glycol (MIRALAX) packet Take 17 g by mouth daily. 02/26/18   Tasia Catchings, Amy V, PA-C  predniSONE (DELTASONE) 20 MG tablet Take 2 tablets (40 mg total) by mouth daily for 4 days. 03/16/18 03/20/18  Volanda Napoleon, PA-C  traZODone (DESYREL) 50 MG tablet Take 0.5-1 tablets (25-50 mg total) by mouth at bedtime as needed for sleep. 02/02/18   Fulp, Cammie, MD  Vitamin D, Ergocalciferol, (DRISDOL) 1.25 MG (50000 UT) CAPS capsule TAKE 1 CAPSULE ONCE WEEKLY 01/02/18   [provider]    Family History Family History  Problem Relation Age of Onset  . Hypertension Mother   . Hypertension Father   . Breast cancer Maternal Grandmother        4  . Colon cancer  Neg Hx   . Colon polyps Neg Hx   . Kidney disease Neg Hx   . Diabetes Neg Hx   . Esophageal cancer Neg Hx   . Gallbladder disease Neg Hx   . Heart disease Neg Hx   . Asthma Neg Hx   . Cancer Neg Hx   . Stroke Neg Hx     Social History Social History   Tobacco Use  . Smoking status: Never Smoker  . Smokeless tobacco: Never Used  Substance Use Topics  . Alcohol use: No  . Drug use: No     Allergies   Patient has no known allergies.   Review of Systems Review of Systems  Constitutional: Negative for fever.  HENT: Positive for congestion and rhinorrhea. Negative for sore throat.   Respiratory: Positive for cough and chest tightness.   Cardiovascular: Negative for chest pain and leg swelling.  All other systems reviewed and are negative.    Physical Exam Updated Vital Signs BP (!) 137/96   Pulse 100   Temp 99.9 F (37.7 C) (Oral)   Resp 20   Ht 4\' 11"  (1.499 m)   Wt 77.1 kg   LMP 03/06/2018 (Approximate)   SpO2 98%   BMI 34.34 kg/m   Physical Exam Vitals signs and nursing note reviewed.  Constitutional:      Appearance: She is well-developed.     Comments: Sitting comfortably on examination table  HENT:     Head: Normocephalic and atraumatic.     Nose: Congestion present.     Comments: Edematous and erythematous nasal turbinates bilaterally.    Mouth/Throat:     Pharynx: Oropharynx is clear.     Comments: Posterior oropharynx is clear without erythema, edema, exudates.  Uvula is midline.  Airways patent, phonation is intact. Eyes:     General: No scleral icterus.       Right eye: No discharge.        Left eye: No discharge.     Conjunctiva/sclera: Conjunctivae normal.  Cardiovascular:     Rate and Rhythm: Normal rate and regular rhythm.  Pulmonary:     Effort: Pulmonary effort is normal.     Breath sounds: Normal breath sounds.     Comments: Lungs clear to auscultation bilaterally.  Symmetric chest rise.  No wheezing, rales, rhonchi. Skin:     General: Skin is warm and dry.  Neurological:     Mental Status: She is alert.  Psychiatric:        Speech:  Speech normal.        Behavior: Behavior normal.      ED Treatments / Results  Labs (all labs ordered are listed, but only abnormal results are displayed) Labs Reviewed - No data to display  EKG None  Radiology Dg Chest 2 View  Result Date: 03/16/2018 CLINICAL DATA:  Pt states dry cough for 4 days. Denied SOB EXAM: CHEST - 2 VIEW COMPARISON:  07/23/2017 FINDINGS: Normal heart, mediastinum and hila. The lungs are clear.  No pleural effusion or pneumothorax. Skeletal structures are within normal limits. IMPRESSION: Normal chest radiographs. Electronically Signed   By: Lajean Manes M.D.   On: 03/16/2018 12:38    Procedures Procedures (including critical care time)  Medications Ordered in ED Medications - No data to display   Initial Impression / Assessment and Plan / ED Course  I have reviewed the triage vital signs and the nursing notes.  Pertinent labs & imaging results that were available during my care of the patient were reviewed by me and considered in my medical decision making (see chart for details).     33 year old female who presents for evaluation of 4 days of cough, nasal congestion, chest congestion.  Seen in urgent care previously.  No fevers, difficulty breathing. Patient is afebrile, non-toxic appearing, sitting comfortably on examination table. Vital signs reviewed.  Tachycardic and hypertensive.  Will reassess.  Lungs clear to auscultation bilaterally.  Discussed with patient this is most likely viral in nature.  Patient is concerned about infection settling in her chest.  Will obtain chest x-ray.  Doubt pneumonia. History/Physical exam not concerning for PE.  Chest x-ray negative for any acute infectious etiology.  Discussed results with patient.  At this time, no need for antibiotics.  Suspect likely viral URI.  Encourage at home supportive care  measures.  Will give Tessalon Perles, short course of steroids for symptomatic relief.  Encourage at home supportive care measures. At this time, patient exhibits no emergent life-threatening condition that require further evaluation in ED or admission. Patient had ample opportunity for questions and discussion. All patient's questions were answered with full understanding. Strict return precautions discussed. Patient expresses understanding and agreement to plan.   Portions of this note were generated with [employer]. Dictation errors may occur despite best attempts at proofreading.  Final Clinical Impressions(s) / ED Diagnoses   Final diagnoses:  Viral URI with cough    ED Discharge Orders         Ordered    benzonatate (TESSALON) 100 MG capsule  Every 8 hours     03/16/18 1259    predniSONE (DELTASONE) 20 MG tablet  Daily     03/16/18 1259           Desma Mcgregor 03/16/18 1334    Dorie Rank, MD 03/16/18 1605

## 2018-03-31 ENCOUNTER — Telehealth: Payer: Self-pay | Admitting: Family Medicine

## 2018-03-31 NOTE — Telephone Encounter (Signed)
Dismissal letter in guarantor snapshot  °

## 2018-04-04 ENCOUNTER — Telehealth: Payer: Self-pay | Admitting: Family Medicine

## 2018-04-04 NOTE — Progress Notes (Signed)
Patient ID: Sherri [name], female   DOB: 06/30/1985, 33 y.o.   MRN: [id number]       Sherri [name], is a 33 y.o. female  BPZ:[id number]  [id number]  DOB - 18-Mar-1985  Subjective:  Chief Complaint and HPI: Sherri [name] is a 33 y.o. female here for a follow up visit  After being seen in the ED 03/16/2018 for cough.  Believed to be viral URI.  Been donating plasma for 2 weeks.  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  She denies any bleeding or easy bruising.  No h/o of clotting or bleeding d/o.  Feels more tired than usual.    From ED note: HPI: Sherri [name] is a 33 y.o. female past history of fibromyalgia, hypertension, UTI who presents for evaluation of 4 days of dry cough, nasal congestion, rhinorrhea, chest congestion.  Patient reports that she feels like her cough has settled in her chest.  She states that she has some chest soreness, tightness with fits of coughing.  No chest pain at rest.  No difficulty breathing.  Patient reports that she was recently seen at urgent care 03/13/2018 evaluation of same symptoms.  At the time, it was thought to be a viral URI.  She was told to use Flonase and Zyrtec which she states she has been using but is not been helping much.  Patient does report that several people around her at work have been sick with similar symptoms.  Patient denies any history of asthma.  She does not smoke or vape.  Patient denies any fevers. She denies any OCP use, recent immobilization, prior history of DVT/PE, recent surgery, leg swelling, or long travel.   ED/Hospital notes reviewed.   Social History: Family history:  ROS:   Constitutional:  No f/c, No night sweats, No unexplained weight loss. EENT:  No vision changes, No blurry vision, No hearing changes. No mouth, throat, or ear problems.  Respiratory: No cough, No SOB Cardiac: No CP, no palpitations GI:  No abd pain, No N/V/D. GU: No Urinary s/sx Musculoskeletal: No joint pain Neuro: No  headache, no dizziness, no motor weakness.  Skin: No rash Endocrine:  No polydipsia. No polyuria.  Psych: Denies SI/HI  No problems updated.  ALLERGIES: No Known Allergies  PAST MEDICAL HISTORY: Past Medical History:  Diagnosis Date  . Fibromyalgia    denies today  . Genital herpes   . Hypertension    gestational  . 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 Urinary tract infection   . Uterine fibroid     MEDICATIONS AT HOME: Prior to Admission medications   Medication Sig Start Date End Date Taking? Authorizing Provider  benzonatate (TESSALON) 100 MG capsule Take 1 capsule (100 mg total) by mouth every 8 (eight) hours. Patient not taking: Reported on 04/06/2018 03/16/18   Volanda Napoleon, PA-C  cetirizine-pseudoephedrine (ZYRTEC-D) 5-120 MG tablet Take 1 tablet by mouth daily. Patient not taking: Reported on 04/06/2018 03/13/18   Loura Halt A, NP  esomeprazole (NEXIUM) 40 MG capsule Take 1 capsule (40 mg total) by mouth daily. To reduce stomach acid 03/07/18   Fulp, Cammie, MD  fluticasone (FLONASE) 50 MCG/ACT nasal spray Place 1 spray into both nostrils daily. 12/31/17   Loura Halt A, NP  ibuprofen (ADVIL,MOTRIN) 400 MG tablet Take 1 tablet (400 mg total) by mouth every 6 (six) hours as needed. Patient not taking: Reported on 04/06/2018 06/25/17   Augusto Gamble  B, NP  ondansetron (ZOFRAN) 4 MG tablet Take 1 tablet (4 mg total) by mouth every 6 (six) hours. Patient not taking: Reported on 01/06/2018 12/31/17   Loura Halt A, NP  phenazopyridine (PYRIDIUM) 100 MG tablet Take 1 tablet (100 mg total) by mouth 3 (three) times daily. For 2 days to decrease pain with urination Patient not taking: Reported on 04/06/2018 03/07/18   Fulp, Cammie, MD  polyethylene glycol (MIRALAX) packet Take 17 g by mouth daily. Patient not taking: Reported on 04/06/2018 02/26/18   Ok Edwards, PA-C  traZODone (DESYREL) 50 MG tablet Take 0.5-1 tablets (25-50 mg total) by mouth at bedtime as needed  for sleep. 02/02/18   Fulp, Cammie, MD  Vitamin D, Ergocalciferol, (DRISDOL) 1.25 MG (50000 UT) CAPS capsule TAKE 1 CAPSULE ONCE WEEKLY 01/02/18   [provider]     Objective:  EXAM:   Vitals:   04/06/18 1121  BP: 131/89  Pulse: 77  Resp: 16  Temp: 98 F (36.7 C)  TempSrc: Oral  SpO2: 96%  Weight: 176 lb 9.6 oz (80.1 kg)    General appearance : A&OX3. NAD. Non-toxic-appearing HEENT: Atraumatic and Normocephalic.  PERRLA. EOM intact.  Neck: supple, no JVD. No cervical lymphadenopathy. No thyromegaly Chest/Lungs:  Breathing-non-labored, Good air entry bilaterally, breath sounds normal without rales, rhonchi, or wheezing  CVS: S1 S2 regular, no murmurs, gallops, rubs  both legs are warm to touch with = pulse throughout Neurology:  CN II-XII grossly intact, Non focal.   Psych:  TP linear. J/I WNL. Normal speech. Appropriate eye contact and affect.  Skin:  No Rash.  No bleeding/bruising  Data Review Lab Results  Component Value Date   HGBA1C 5.1 01/06/2018     Assessment & Plan   1. Fatigue, unspecified type - CBC with Differential/Platelet - TSH - Basic metabolic panel - Vitamin D, 25-hydroxy IAT +-will refer to hematology if needed.  She is going to bring the paper work over for me to look at(she didn't bring it to the appt).      Patient have been counseled extensively about nutrition and exercise  Return for keep appt with Dr Chapman Fitch for MArch.  The patient was given clear instructions to go to ER or return to medical center if symptoms don't improve, worsen or new problems develop. The patient verbalized understanding. The patient was told to call to get lab results if they haven't heard anything in the next week.     Freeman Caldron, PA-C [facility] and [facility] [place], [place]   04/06/2018, 11:40 AM

## 2018-04-04 NOTE — Telephone Encounter (Signed)
Pt states [facility] Plasma appt over weekend revealed positive IAT. Pt has results and was told fu with provider as soon as possible. Gave pt appt today 04/04/18 with Wynetta Emery. Is this an urgent appt or can it wait? Please advise.  Pt [phone].

## 2018-04-04 NOTE — Telephone Encounter (Signed)
Pt advised not to receive blood products: blood or plasma until she has an appointment with the doctor. She states this was her first time attempting to donate plasma. Advised to inform any doctor about this in the event for the need to receive blood products.

## 2018-04-05 ENCOUNTER — Ambulatory Visit: Payer: PRIVATE HEALTH INSURANCE | Admitting: Internal Medicine

## 2018-04-06 ENCOUNTER — Ambulatory Visit: Payer: PRIVATE HEALTH INSURANCE | Attending: Internal Medicine | Admitting: Physician Assistant

## 2018-04-06 VITALS — BP 131/89 | HR 77 | Temp 98.0°F | Resp 16 | Wt 176.6 lb

## 2018-04-06 DIAGNOSIS — R5383 Other fatigue: Secondary | ICD-10-CM

## 2018-04-07 ENCOUNTER — Telehealth: Payer: Self-pay

## 2018-04-07 LAB — CBC WITH DIFFERENTIAL/PLATELET
Basophils Absolute: 0 10*3/uL (ref 0.0–0.2)
Basos: 1 %
EOS (ABSOLUTE): 0.2 10*3/uL (ref 0.0–0.4)
Eos: 3 %
Hematocrit: 36.1 % (ref 34.0–46.6)
Hemoglobin: 11.9 g/dL (ref 11.1–15.9)
Immature Grans (Abs): 0 10*3/uL (ref 0.0–0.1)
Immature Granulocytes: 0 %
Lymphocytes Absolute: 2.3 10*3/uL (ref 0.7–3.1)
Lymphs: 38 %
MCH: 28.1 pg (ref 26.6–33.0)
MCHC: 33 g/dL (ref 31.5–35.7)
MCV: 85 fL (ref 79–97)
Monocytes Absolute: 0.5 10*3/uL (ref 0.1–0.9)
Monocytes: 9 %
Neutrophils Absolute: 3 10*3/uL (ref 1.4–7.0)
Neutrophils: 49 %
Platelets: 323 10*3/uL (ref 150–450)
RBC: 4.23 x10E6/uL (ref 3.77–5.28)
RDW: 14.4 % (ref 11.7–15.4)
WBC: 6 10*3/uL (ref 3.4–10.8)

## 2018-04-07 LAB — BASIC METABOLIC PANEL
BUN/Creatinine Ratio: 6 — ABNORMAL LOW (ref 9–23)
BUN: 5 mg/dL — ABNORMAL LOW (ref 6–20)
CO2: 21 mmol/L (ref 20–29)
Calcium: 9.3 mg/dL (ref 8.7–10.2)
Chloride: 103 mmol/L (ref 96–106)
Creatinine, Ser: 0.79 mg/dL (ref 0.57–1.00)
GFR calc Af Amer: 115 mL/min/{1.73_m2} (ref >59)
GFR calc non Af Amer: 100 mL/min/{1.73_m2} (ref >59)
Glucose: 84 mg/dL (ref 65–99)
Potassium: 4.2 mmol/L (ref 3.5–5.2)
Sodium: 139 mmol/L (ref 134–144)

## 2018-04-07 LAB — TSH: TSH: 1.18 u[IU]/mL (ref 0.450–4.500)

## 2018-04-07 LAB — VITAMIN D 25 HYDROXY (VIT D DEFICIENCY, FRACTURES): Vit D, 25-Hydroxy: 51.5 ng/mL (ref 30.0–100.0)

## 2018-04-07 NOTE — Telephone Encounter (Signed)
Contacted pt to go over lab results pt didn't answer and phone just kept ringing

## 2018-04-07 NOTE — Telephone Encounter (Signed)
Pt returned call provided pt with lab results pt doesn't have any questions or concerns

## 2018-05-04 ENCOUNTER — Ambulatory Visit: Payer: PRIVATE HEALTH INSURANCE | Admitting: Family Medicine

## 2018-05-20 ENCOUNTER — Other Ambulatory Visit: Payer: Self-pay

## 2018-05-20 ENCOUNTER — Ambulatory Visit: Payer: PRIVATE HEALTH INSURANCE | Attending: Family Medicine | Admitting: Family Medicine

## 2018-05-20 VITALS — BP 116/72 | HR 74 | Temp 98.5°F | Ht 59.0 in | Wt 176.4 lb

## 2018-05-20 DIAGNOSIS — R300 Dysuria: Secondary | ICD-10-CM | POA: Diagnosis not present

## 2018-05-20 DIAGNOSIS — N3010 Interstitial cystitis (chronic) without hematuria: Secondary | ICD-10-CM

## 2018-05-20 DIAGNOSIS — R319 Hematuria, unspecified: Secondary | ICD-10-CM | POA: Diagnosis not present

## 2018-05-20 DIAGNOSIS — R5383 Other fatigue: Secondary | ICD-10-CM | POA: Diagnosis not present

## 2018-05-20 LAB — POCT URINALYSIS DIP (CLINITEK)
Bilirubin, UA: NEGATIVE
Glucose, UA: NEGATIVE mg/dL
Ketones, POC UA: NEGATIVE mg/dL
Leukocytes, UA: NEGATIVE
Nitrite, UA: NEGATIVE
POC PROTEIN,UA: NEGATIVE
Spec Grav, UA: 1.02
Urobilinogen, UA: 0.2 U/dL
pH, UA: 6

## 2018-05-20 NOTE — Patient Instructions (Signed)
Eating Plan for Interstitial Cystitis Interstitial cystitis (IC) is a long-term (chronic) condition that causes pain and pressure in the bladder, the lower abdomen, and the pelvic area. Other symptoms of IC include urinary urgency and frequency. Symptoms tend to come and go. Many people with IC find that certain foods trigger their symptoms. Different foods may be problematic for different people. Some foods are more likely to cause symptoms than others. Learning which foods bother you and which do not can help you come up with an eating plan to manage IC. What are tips for following this plan? You may find it helpful to work with a dietitian. This health care provider can help you develop your eating plan by doing an elimination diet, which involves these steps:  Start with a list of foods that you think trigger your IC symptoms along with the foods that most commonly trigger symptoms for many people with IC.  Eliminate those foods from your diet for about one month, then start reintroducing the foods one at a time to see which ones trigger your symptoms.  Make a list of the foods that trigger your symptoms. It may take several months to find out which foods bother you. Reading food labels Once you know which foods trigger your IC symptoms, you can avoid them. However, it is also a good idea to read food labels because some foods that trigger your symptoms may be included as ingredients in other foods. These ingredients may include:  Chili peppers.  Tomato products.  Soy.  Worcestershire sauce.  Vinegar.  Alcohol.  Citrus flavors or juices.  Artificial sweeteners.  Monosodium glutamate. Shopping  Shopping can be a challenge if many foods trigger your IC. When you go grocery shopping, bring a list of the foods you can eat.  You can get an app for your phone that lets you know which foods are the safest and which you may want to avoid. You can find the app at the Interstitial  Cystitis Network website: www.ic-network.com Meal planning  Plan your meals according to the results of your elimination diet. If you have not done an elimination diet, plan meals according to IC food lists recommended by your health care provider or dietitian. These lists tell you which foods are least and most likely to cause symptoms.  Avoid certain types of food when you go out to eat, such as pizza and foods typically served at [place], [place], and [place]. These foods often contain ingredients that can aggravate IC. General information Here are some general guidelines for an IC eating plan:  Do not eat large portions.  Drink plenty of fluids with your meals.  Do not eat foods that are high in sugar, salt, or saturated fat.  Choose whole fruits instead of juice.  Eat a colorful variety of vegetables. What foods should I eat? For people with IC, the best diet is a balanced one that includes things from all the food groups. Even if you have to avoid certain foods, there are still plenty of healthy choices in each group. The following are some foods that are least bothersome and may be safest to eat: Fruits Bananas. Blueberries and blueberry juice. Melons. Pears. Apples. Dates. Prunes. Raisins. Apricots. Vegetables Asparagus. Avocado. Celery. Beets. Bell peppers. Black olives. Broccoli. Brussels sprouts. Cabbage. Carrots. Cauliflower. Cucumber. Eggplant. Green beans. Potatoes. Radishes. Spinach. Squash. Turnips. Zucchini. Mushrooms. Peas. Grains Oats. Rice. Bran. Oatmeal. Whole wheat bread. Meats and other proteins Beef. Fish and other seafood. Eggs. Nuts.  Broccoli. Brussels sprouts. Cabbage. Carrots. Cauliflower. Cucumber. Eggplant. Green beans. Potatoes. Radishes. Spinach. Squash. Turnips. Zucchini. Mushrooms. Peas.  Grains  Oats. Rice. Bran. Oatmeal. Whole wheat bread.  Meats and other proteins  Beef. Fish and other seafood. Eggs. Nuts. Peanut butter. Pork. Poultry. Lamb. Garbanzo beans. Pinto beans.  Dairy  Whole or low-fat milk. American, mozzarella, mild cheddar, feta, ricotta, and cream cheeses.  The items listed above may not be a complete list of foods and beverages you can eat. Contact a dietitian for more information.  What foods should I avoid?  You  should avoid any foods that seem to trigger your symptoms. It is also a good idea to avoid foods that are most likely to cause symptoms in many people with IC. These include the following:  Fruits  Citrus fruits, including lemons, limes, oranges, and grapefruit. Cranberries. Strawberries. Pineapple. Kiwi.  Vegetables  Chili peppers. Onions. Sauerkraut. Tomato and tomato products. Pickles.  Grains  You do not need to avoid any type of grain unless it triggers your symptoms.  Meats and other proteins  Precooked or cured meats, such as sausages or meat loaves. Soy products.  Dairy  Chocolate ice cream. Processed cheese. Yogurt.  Beverages  Alcohol. Chocolate drinks. Coffee. Cranberry juice. Carbonated drinks. Tea (black, green, or herbal). Tomato juice. Sports drinks.  The items listed above may not be a complete list of foods and beverages you should avoid. Contact a dietitian for more information.  Summary  · Many people with IC find that certain foods trigger their symptoms. Different foods may be problematic for different people. Some foods are more likely to cause symptoms than others.  · You may find it helpful to work with a dietitian to do an elimination diet and come up with an eating plan that is right for you.  · Plan your meals according to the results of your elimination diet. If you have not done an elimination diet, plan your meals using IC food lists. These lists tell you which foods are least and most likely to cause symptoms.  · The best diet for people with IC is a balanced diet that includes foods from all the food groups. Even if you have to avoid certain foods, there are still plenty of healthy choices in each group.  This information is not intended to replace advice given to you by your health care provider. Make sure you discuss any questions you have with your health care provider.  Document Released: 10/21/2017 Document Revised: 10/21/2017 Document Reviewed: 10/21/2017  Elsevier Interactive  Patient Education © 2019 [employer]  Interstitial Cystitis    Interstitial cystitis is inflammation of the bladder. This may cause pain in the bladder area as well as a frequent and urgent need to urinate. The bladder is a hollow organ in the lower part of the abdomen. It stores urine after the urine is made in the kidneys.  The severity of interstitial cystitis can vary from person to person. You may have flare-ups, and then your symptoms may go away for a while. For many people, it becomes a long-term (chronic) problem.  What are the causes?  The cause of this condition is not known.  What increases the risk?  The following factors may make you more likely to develop this condition:  · You are female.  · You have fibromyalgia.  · You have irritable bowel syndrome (IBS).  · You have endometriosis.  This condition may be aggravated by:  · Stress.  ·   Smoking.  · Spicy foods.  What are the signs or symptoms?  Symptoms of interstitial cystitis vary, and they can change over time. Symptoms may include:  · Discomfort or pain in the bladder area, which is in the lower abdomen. Pain can range from mild to severe. The pain may change in intensity as the bladder fills with urine or as it empties.  · Pain in the pelvic area, between the hip bones.  · An urgent need to urinate.  · Frequent urination.  · Pain during urination.  · Pain during sex.  · Blood in the urine.  For women, symptoms often get worse during menstruation.  How is this diagnosed?  This condition is diagnosed based on your symptoms, your medical history, and a physical exam. You may have tests to rule out other conditions, such as:  · Urine tests.  · Cystoscopy. For this test, a tool similar to a very thin telescope is used to look into your bladder.  · Biopsy. This involves taking a sample of tissue from the bladder to be examined under a microscope.  How is this treated?  There is no cure for this condition, but treatment can help you control your  symptoms. Work closely with your health care provider to find the most effective treatments for you. Treatment options may include:  · Medicines to relieve pain and reduce how often you feel the need to urinate.  · Learning ways to control when you urinate (bladder training).  · Lifestyle changes, such as changing your diet or taking steps to control stress.  · Using a device that provides electrical stimulation to your nerves, which can relieve pain (neuromodulation therapy). The device is placed on your back, where it blocks the nerves that cause you to feel pain in your bladder area.  · A procedure that stretches your bladder by filling it with air or fluid.  · Surgery. This is rare. It is only done for extreme cases, if other treatments do not help.  Follow these instructions at home:  Bladder training    · Use bladder training techniques as directed. Techniques may include:  ? Urinating at scheduled times.  ? Training yourself to delay urination.  ? Doing exercises (Kegel exercises) to strengthen the muscles that control urine flow.  · Keep a bladder diary.  ? Write down the times that you urinate and any symptoms that you have. This can help you find out which foods, liquids, or activities make your symptoms worse.  ? Use your bladder diary to schedule bathroom trips. If you are away from home, plan to be near a bathroom at each of your scheduled times.  · Make sure that you urinate just before you leave the house and just before you go to bed.  Eating and drinking  · Make dietary changes as recommended by your health care provider. You may need to avoid:  ? Spicy foods.  ? Foods that contain a lot of potassium.  · Limit your intake of beverages that make you need to urinate. These include:  ? Caffeinated beverages like soda, coffee, and tea.  ? Alcohol.  General instructions  · Take over-the-counter and prescription medicines only as told by your health care provider.  · Do not drink alcohol.  · You can try a  warm or cool compress over your bladder for comfort.  · Avoid wearing tight clothing.  · Do not use any products that contain nicotine or tobacco, such as cigarettes   and e-cigarettes. If you need help quitting, ask your health care provider.  · Keep all follow-up visits as told by your health care provider. This is important.  Contact a health care provider if you have:  · Symptoms that do not get better with treatment.  · Pain or discomfort that gets worse.  · More frequent urges to urinate.  · A fever.  Get help right away if:  · You have no control over when you urinate.  Summary  · Interstitial cystitis is inflammation of the bladder.  · This condition may cause pain in the bladder area as well as a frequent and urgent need to urinate.  · You may have flare-ups of the condition, and then it may go away for a while. For many people, it becomes a long-term (chronic) problem.  · There is no cure for interstitial cystitis, but treatment methods are available to control your symptoms.  This information is not intended to replace advice given to you by your health care provider. Make sure you discuss any questions you have with your health care provider.  Document Released: 10/18/2003 Document Revised: 01/11/2017 Document Reviewed: 01/11/2017  Elsevier Interactive Patient Education © 2019 [employer]

## 2018-05-20 NOTE — Progress Notes (Signed)
Established Patient Office Visit  Subjective:  Patient ID: Sherri [name], female    DOB: 1985-04-06  Age: 33 y.o. MRN: [id number]  CC:  Chief Complaint  Patient presents with  . Follow-up    HPI Sherri [name] presents for complaint of continued burning with urination. Patient did follow-up with Urology but states that she could not afford the procedure that was recommended. She continues to have issues with discomfort with urination. She denies any fever or chills, no urinary frequency. She does have some occasional lower abdominal discomfort at times. No nausea or increased low back pain. Previously prescribed pyridium did not help.   Past Medical History:  Diagnosis Date  . Fibromyalgia    denies today  . Genital herpes   . Hypertension    gestational  . 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 Urinary tract infection   . Uterine fibroid     Past Surgical History:  Procedure Laterality Date  . CESAREAN SECTION N/A 06/05/2014   Procedure: CESAREAN SECTION;  Surgeon: Truett Mainland, DO;  Location: [facility];  Service: Obstetrics;  Laterality: N/A;  . WISDOM TOOTH EXTRACTION      Family History  Problem Relation Age of Onset  . Hypertension Mother   . Hypertension Father   . Breast cancer Maternal Grandmother        80  . Colon cancer Neg Hx   . Colon polyps Neg Hx   . Kidney disease Neg Hx   . Diabetes Neg Hx   . Esophageal cancer Neg Hx   . Gallbladder disease Neg Hx   . Heart disease Neg Hx   . Asthma Neg Hx   . Cancer Neg Hx   . Stroke Neg Hx     Social History   Tobacco Use  . Smoking status: Never Smoker  . Smokeless tobacco: Never Used  Substance Use Topics  . Alcohol use: No  . Drug use: No   No Known Allergies  Outpatient Medications Prior to Visit  Medication Sig Dispense Refill  . cetirizine-pseudoephedrine (ZYRTEC-D) 5-120 MG tablet Take 1 tablet by mouth daily. 30 tablet 0  . esomeprazole (NEXIUM) 40 MG capsule Take  1 capsule (40 mg total) by mouth daily. To reduce stomach acid 30 capsule 5  . fluticasone (FLONASE) 50 MCG/ACT nasal spray Place 1 spray into both nostrils daily. 16 g 2  . ibuprofen (ADVIL,MOTRIN) 400 MG tablet Take 1 tablet (400 mg total) by mouth every 6 (six) hours as needed. 30 tablet 0  . ondansetron (ZOFRAN) 4 MG tablet Take 1 tablet (4 mg total) by mouth every 6 (six) hours. 12 tablet 0  . polyethylene glycol (MIRALAX) packet Take 17 g by mouth daily. 14 each 0  . traZODone (DESYREL) 50 MG tablet Take 0.5-1 tablets (25-50 mg total) by mouth at bedtime as needed for sleep. 30 tablet 3  . Vitamin D, Ergocalciferol, (DRISDOL) 1.25 MG (50000 UT) CAPS capsule TAKE 1 CAPSULE ONCE WEEKLY  0  . benzonatate (TESSALON) 100 MG capsule Take 1 capsule (100 mg total) by mouth every 8 (eight) hours. (Patient not taking: Reported on 04/06/2018) 21 capsule 0  . phenazopyridine (PYRIDIUM) 100 MG tablet Take 1 tablet (100 mg total) by mouth 3 (three) times daily. For 2 days to decrease pain with urination (Patient not taking: Reported on 04/06/2018) 6 tablet 0   No facility-administered medications prior to visit.     No Known Allergies  ROS Review of  Systems  Constitutional: Positive for fatigue. Negative for chills and fever.  Respiratory: Negative for cough and shortness of breath.   Cardiovascular: Negative for chest pain, palpitations and leg swelling.  Gastrointestinal: Positive for abdominal pain (occasional lower abdonminal discomfort).  Endocrine: Negative for polydipsia, polyphagia and polyuria.  Genitourinary: Positive for dysuria. Negative for frequency, hematuria and pelvic pain.  Musculoskeletal: Positive for myalgias (occasional due to fibromyalgia). Negative for back pain and gait problem.  Neurological: Negative for dizziness and light-headedness.  Hematological: Negative for adenopathy. Does not bruise/bleed easily.      Objective:    Physical Exam  Constitutional: She is oriented  to person, place, and time. She appears well-developed and well-nourished.  Overweight for height  Cardiovascular: Normal rate and regular rhythm.  Pulmonary/Chest: Effort normal and breath sounds normal.  Abdominal: Soft. There is no abdominal tenderness. There is no rebound and no guarding.  No CVA tenderness  Musculoskeletal: Normal range of motion.        General: No tenderness or edema.  Neurological: She is alert and oriented to person, place, and time.  Skin: Skin is warm and dry.  Psychiatric: She has a normal mood and affect. Her behavior is normal.  Nursing note and vitals reviewed.   BP 116/72   Pulse 74   Temp 98.5 F (36.9 C) (Oral)   Ht 4\' 11"  (1.499 m)   Wt 176 lb 6.4 oz (80 kg)   SpO2 96%   BMI 35.63 kg/m  Wt Readings from Last 3 Encounters:  05/20/18 176 lb 6.4 oz (80 kg)  04/06/18 176 lb 9.6 oz (80.1 kg)  03/16/18 170 lb (77.1 kg)     Health Maintenance Due  Topic Date Due  . PAP SMEAR-Modifier  07/17/2017  . INFLUENZA VACCINE  09/30/2017      Lab Results  Component Value Date   TSH 1.180 04/06/2018   Lab Results  Component Value Date   WBC 6.0 04/06/2018   HGB 11.9 04/06/2018   HCT 36.1 04/06/2018   MCV 85 04/06/2018   PLT 323 04/06/2018   Lab Results  Component Value Date   NA 139 04/06/2018   K 4.2 04/06/2018   CO2 21 04/06/2018   GLUCOSE 84 04/06/2018   BUN 5 (L) 04/06/2018   CREATININE 0.79 04/06/2018   BILITOT 0.3 05/12/2017   ALKPHOS 76 05/12/2017   AST 20 05/12/2017   ALT 14 05/12/2017   PROT 7.9 05/12/2017   ALBUMIN 4.3 05/12/2017   CALCIUM 9.3 04/06/2018   ANIONGAP 6 10/31/2017   Lab Results  Component Value Date   CHOL 198 01/07/2017   Lab Results  Component Value Date   HDL 56 01/07/2017   Lab Results  Component Value Date   LDLCALC 128 (H) 01/07/2017   Lab Results  Component Value Date   TRIG 57 01/07/2017   Lab Results  Component Value Date   CHOLHDL 3.5 01/07/2017   Lab Results  Component Value  Date   HGBA1C 5.1 01/06/2018      Assessment & Plan:  1. Chronic interstitial cystitis Notes from patient's recent urology visit reviewed. Patient with diagnosis of interstitial cystitis. Information on diet for IC given to patient as part of her AVS and patient is encouraged to follow-up with Urology to see if there are other treatment options available. (Urology notes have been received but not yet scanned into patient's chart).  2. Burning with urination Will check UA to look for possible UTI and send urine for  culture to check for any bacterial growth - POCT URINALYSIS DIP (CLINITEK) - Urine Culture  3. Hematuria, unspecified type Patient with hematuria on UA and urine will be sent for culture and patient will be notified if further treatment is needed based on the culture results - Urine Culture  An After Visit Summary was printed and given to the patient.   Follow-up: Return in about 3 months (around 08/20/2018), or if symptoms worsen or fail to improve, for dysuria; f/u as needed and 3 months.   Antony Blackbird, MD

## 2018-05-22 LAB — URINE CULTURE

## 2018-05-23 ENCOUNTER — Telehealth: Payer: Self-pay | Admitting: Family Medicine

## 2018-05-26 NOTE — Telephone Encounter (Signed)
-----   Message from Antony Blackbird, MD sent at 05/22/2018 12:57 PM EDT ----- Please notify patient that her urine culture did not indicate the presence of a bladder infection.

## 2018-05-26 NOTE — Telephone Encounter (Signed)
Patient verified DOB Patient is aware of no infection being noted in the bladder. Patient states the frequency is still present and she will contact a specialist she saw prior for further evaluation. No further questions at this time.

## 2018-05-28 ENCOUNTER — Encounter: Payer: Self-pay | Admitting: Family Medicine

## 2018-06-02 ENCOUNTER — Ambulatory Visit (INDEPENDENT_AMBULATORY_CARE_PROVIDER_SITE_OTHER): Payer: PRIVATE HEALTH INSURANCE

## 2018-06-02 ENCOUNTER — Ambulatory Visit (INDEPENDENT_AMBULATORY_CARE_PROVIDER_SITE_OTHER): Payer: PRIVATE HEALTH INSURANCE | Admitting: Podiatry

## 2018-06-02 ENCOUNTER — Other Ambulatory Visit: Payer: Self-pay | Admitting: Podiatry

## 2018-06-02 ENCOUNTER — Encounter: Payer: Self-pay | Admitting: Podiatry

## 2018-06-02 ENCOUNTER — Other Ambulatory Visit: Payer: Self-pay

## 2018-06-02 VITALS — Temp 97.5°F

## 2018-06-02 DIAGNOSIS — M79671 Pain in right foot: Secondary | ICD-10-CM | POA: Diagnosis not present

## 2018-06-02 DIAGNOSIS — M722 Plantar fascial fibromatosis: Secondary | ICD-10-CM

## 2018-06-02 DIAGNOSIS — M79672 Pain in left foot: Secondary | ICD-10-CM | POA: Diagnosis not present

## 2018-06-02 MED ORDER — TRIAMCINOLONE ACETONIDE 10 MG/ML IJ SUSP
10.0000 mg | Freq: Once | INTRAMUSCULAR | Status: AC
  Administered 2018-06-02: 10 mg

## 2018-06-02 NOTE — Patient Instructions (Signed)

## 2018-06-03 NOTE — Progress Notes (Signed)
Subjective:   Patient ID: Sherri [name], female   DOB: 33 y.o.   MRN: [id number]   HPI Patient states she has severe reoccurrence of heel pain right over left and states that if it does not get better she is can have to have surgery if she cannot work or do any kind of activity and needs to be on her feet with work   ROS      Objective:  Physical Exam  Neurovascular status intact with exquisite discomfort plantar aspect heel region right over left with inflammation fluid buildup     Assessment:  Acute plantar fasciitis bilateral with inflammation fluid buildup     Plan:  H&P condition reviewed and today I x-rayed her feet to see the progression of deformity.  I injected the plantar fascia after sterile prep bilateral heel 3 mg Kenalog 5 mg Xylocaine advised on reduced activity and will reappoint 2 weeks and if symptoms do not improve or get a need to consider surgical intervention in this case  X-rays indicate that there is minimal spur formation with no changes of a significant nature from last year

## 2018-06-16 ENCOUNTER — Ambulatory Visit (INDEPENDENT_AMBULATORY_CARE_PROVIDER_SITE_OTHER): Payer: PRIVATE HEALTH INSURANCE | Admitting: Podiatry

## 2018-06-16 ENCOUNTER — Other Ambulatory Visit: Payer: Self-pay

## 2018-06-16 ENCOUNTER — Encounter: Payer: Self-pay | Admitting: Podiatry

## 2018-06-16 VITALS — Temp 97.7°F

## 2018-06-16 DIAGNOSIS — M79671 Pain in right foot: Secondary | ICD-10-CM

## 2018-06-16 DIAGNOSIS — M79672 Pain in left foot: Secondary | ICD-10-CM

## 2018-06-16 DIAGNOSIS — M722 Plantar fascial fibromatosis: Secondary | ICD-10-CM

## 2018-06-20 NOTE — Progress Notes (Signed)
Subjective:   Patient ID: Sherri [name], female   DOB: 33 y.o.   MRN: [id number]   HPI Patient states my feet feel better now but I have had these.  For the better and then they seem like they get worse again   ROS      Objective:  Physical Exam  Neurovascular status intact negative Homans sign noted with patient having significant flatfoot deformity and continued chronic discomfort in the plantar heel bilateral which is improved but still present     Assessment:  Fasciitis symptomatology which improved for short periods of time with structural changes     Plan:  Reviewed condition recommended continued supportive shoe gear stretching exercises and casted for functional orthotics today to reduce pressure on the feet.  Reappoint when ready

## 2018-07-07 ENCOUNTER — Ambulatory Visit: Payer: PRIVATE HEALTH INSURANCE | Admitting: Orthotics

## 2018-07-07 ENCOUNTER — Other Ambulatory Visit: Payer: Self-pay

## 2018-07-07 VITALS — Temp 97.5°F

## 2018-07-07 DIAGNOSIS — M722 Plantar fascial fibromatosis: Secondary | ICD-10-CM

## 2018-07-07 NOTE — Progress Notes (Signed)
Patient came in today to pick up custom made foot orthotics.  The goals were accomplished and the patient reported no dissatisfaction with said orthotics.  Patient was advised of breakin period and how to report any issues. 

## 2018-08-20 ENCOUNTER — Ambulatory Visit (HOSPITAL_COMMUNITY)
Admission: EM | Admit: 2018-08-20 | Discharge: 2018-08-20 | Disposition: A | Discharge status: Home / Self Care | Payer: PRIVATE HEALTH INSURANCE | Attending: Internal Medicine | Admitting: Internal Medicine

## 2018-08-20 ENCOUNTER — Encounter (HOSPITAL_COMMUNITY): Payer: Self-pay

## 2018-08-20 ENCOUNTER — Other Ambulatory Visit: Payer: Self-pay

## 2018-08-20 DIAGNOSIS — M7918 Myalgia, other site: Secondary | ICD-10-CM | POA: Diagnosis not present

## 2018-08-20 LAB — POCT URINALYSIS DIP (DEVICE)
Bilirubin Urine: NEGATIVE
Glucose, UA: NEGATIVE mg/dL
Hgb urine dipstick: NEGATIVE
Ketones, ur: NEGATIVE mg/dL
Nitrite: NEGATIVE
Protein, ur: NEGATIVE mg/dL
Specific Gravity, Urine: 1.025 (ref 1.005–1.030)
Urobilinogen, UA: 0.2 mg/dL (ref 0.0–1.0)
pH: 7 (ref 5.0–8.0)

## 2018-08-20 MED ORDER — NAPROXEN 375 MG PO TABS: 375.0000 mg | ORAL_TABLET | Freq: Two times a day (BID) | ORAL | 0 refills | Status: DC

## 2018-08-20 MED ORDER — CYCLOBENZAPRINE HCL 10 MG PO TABS: 10.0000 mg | ORAL_TABLET | Freq: Two times a day (BID) | ORAL | 0 refills | Status: DC | PRN

## 2018-08-20 NOTE — ED Triage Notes (Signed)
Pt states she has pian in her left side flank. X 3 days. Pt states she has tried Advil.

## 2018-08-20 NOTE — ED Provider Notes (Signed)
[facility]    CSN: [id number] Arrival date & time: 08/20/18  1119      History   Chief Complaint Chief Complaint  Patient presents with  . Abdominal Pain    HPI Sherri [name] is a 33 y.o. female with history of fibromyalgia comes to urgent care with complaints of left flank pain of 3 days duration.  Patient says symptoms started 3 days ago and is gotten progressively worse.  Pain is of moderate severity.  Worsened by movement.  She is tried Advil with no improvement.  No relieving factors known.  Pain does not radiate into the groin.  She denies any dysuria urgency or frequency.  No fever or chills.  No nausea vomiting..     Past Medical History:  Diagnosis Date  . Fibromyalgia    denies today  . Genital herpes   . Hypertension    gestational  . 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 Urinary tract infection   . Uterine fibroid     Patient Active Problem List   Diagnosis Date Noted  . Hypertension   . Genital herpes   . Yeast infection 12/03/2016  . Status post cesarean delivery 06/06/2014  . Uterine fibroid 12/25/2013  . Obesity 12/25/2013    Past Surgical History:  Procedure Laterality Date  . CESAREAN SECTION N/A 06/05/2014   Procedure: CESAREAN SECTION;  Surgeon: Truett Mainland, DO;  Location: [facility];  Service: Obstetrics;  Laterality: N/A;  . WISDOM TOOTH EXTRACTION      OB History    Gravida  1   Para  1   Term      Preterm  1   AB      Living  2     SAB      TAB      Ectopic      Multiple  1   Live Births  2            Home Medications    Prior to Admission medications   Medication Sig Start Date End Date Taking? Authorizing Provider  benzonatate (TESSALON) 100 MG capsule Take 1 capsule (100 mg total) by mouth every 8 (eight) hours. 03/16/18   Volanda Napoleon, PA-C  cetirizine-pseudoephedrine (ZYRTEC-D) 5-120 MG tablet Take 1 tablet by mouth daily. 03/13/18   Loura Halt A, NP   esomeprazole (NEXIUM) 40 MG capsule Take 1 capsule (40 mg total) by mouth daily. To reduce stomach acid 03/07/18   Fulp, Cammie, MD  fluticasone (FLONASE) 50 MCG/ACT nasal spray Place 1 spray into both nostrils daily. 12/31/17   Loura Halt A, NP  ibuprofen (ADVIL,MOTRIN) 400 MG tablet Take 1 tablet (400 mg total) by mouth every 6 (six) hours as needed. 06/25/17   Zigmund Gottron, NP  ondansetron (ZOFRAN) 4 MG tablet Take 1 tablet (4 mg total) by mouth every 6 (six) hours. 12/31/17   Loura Halt A, NP  phenazopyridine (PYRIDIUM) 100 MG tablet Take 1 tablet (100 mg total) by mouth 3 (three) times daily. For 2 days to decrease pain with urination 03/07/18   Fulp, Cammie, MD  polyethylene glycol (MIRALAX) packet Take 17 g by mouth daily. 02/26/18   Tasia Catchings, Amy V, PA-C  traZODone (DESYREL) 50 MG tablet Take 0.5-1 tablets (25-50 mg total) by mouth at bedtime as needed for sleep. 02/02/18   Fulp, Cammie, MD  Vitamin D, Ergocalciferol, (DRISDOL) 1.25 MG (50000 UT) CAPS capsule TAKE 1 CAPSULE ONCE WEEKLY 01/02/18  [provider]    Family History Family History  Problem Relation Age of Onset  . Hypertension Mother   . Hypertension Father   . Breast cancer Maternal Grandmother        64  . Colon cancer Neg Hx   . Colon polyps Neg Hx   . Kidney disease Neg Hx   . Diabetes Neg Hx   . Esophageal cancer Neg Hx   . Gallbladder disease Neg Hx   . Heart disease Neg Hx   . Asthma Neg Hx   . Cancer Neg Hx   . Stroke Neg Hx     Social History Social History   Tobacco Use  . Smoking status: Never Smoker  . Smokeless tobacco: Never Used  Substance Use Topics  . Alcohol use: No  . Drug use: No     Allergies   Patient has no known allergies.   Review of Systems Review of Systems  Constitutional: Positive for activity change. Negative for appetite change, chills, fatigue and fever.  HENT: Negative.   Respiratory: Negative.   Cardiovascular: Negative.   Gastrointestinal: Positive for  abdominal pain. Negative for diarrhea, nausea and vomiting.  Genitourinary: Negative for dysuria, frequency, hematuria, urgency, vaginal bleeding and vaginal discharge.  Musculoskeletal: Negative.   Skin: Negative.   Neurological: Negative for dizziness, seizures, syncope, weakness and headaches.     Physical Exam Triage Vital Signs ED Triage Vitals  Enc Vitals Group     BP 08/20/18 1158 129/90     Pulse Rate 08/20/18 1158 88     Resp 08/20/18 1158 18     Temp 08/20/18 1158 99.2 F (37.3 C)     Temp src --      SpO2 08/20/18 1158 98 %     Weight 08/20/18 1156 170 lb (77.1 kg)     Height --      Head Circumference --      Peak Flow --      Pain Score 08/20/18 1156 7     Pain Loc --      Pain Edu? --      Excl. in [place]? --    No data found.  Updated Vital Signs BP 129/90 (BP Location: Right Arm)   Pulse 88   Temp 99.2 F (37.3 C)   Resp 18   Wt 77.1 kg   LMP 08/01/2018   SpO2 98%   BMI 34.34 kg/m   Visual Acuity Right Eye Distance:   Left Eye Distance:   Bilateral Distance:    Right Eye Near:   Left Eye Near:    Bilateral Near:     Physical Exam Constitutional:      Appearance: She is well-developed.  Abdominal:     General: Abdomen is protuberant. Bowel sounds are normal.     Palpations: Abdomen is soft. There is no shifting dullness, hepatomegaly or mass.     Tenderness: There is abdominal tenderness.     Comments: Left flank tenderness on palpation of the paraspinal muscles.  No guarding or rebound tenderness on palpation of the abdomen.  Neurological:     Mental Status: She is alert.      UC Treatments / Results  Labs (all labs ordered are listed, but only abnormal results are displayed) Labs Reviewed - No data to display  EKG None  Radiology No results found.  Procedures Procedures (including critical care time)  Medications Ordered in UC Medications - No data to display  Initial Impression /  Assessment and Plan / UC Course  I have  reviewed the triage vital signs and the nursing notes.  Pertinent labs & imaging results that were available during my care of the patient were reviewed by me and considered in my medical decision making (see chart for details).     1.  Musculoskeletal, keep toes on nitrites.  Involving left flank: Warm compress Urinalysis is negative for hemoglobin, Naproxen 375 twice daily Gentle stretching exercises If patient develops worsening pain, numbness or tingling or dysuria frequency of bloody urine she needs to return to urgent care for reevaluation.  Final Clinical Impressions(s) / UC Diagnoses   Final diagnoses:  None   Discharge Instructions   None    ED Prescriptions    None     Controlled Substance Prescriptions [place] Controlled Substance Registry consulted? No   Chase Picket, MD 08/20/18 1248

## 2018-08-22 ENCOUNTER — Ambulatory Visit: Payer: PRIVATE HEALTH INSURANCE | Attending: Family Medicine | Admitting: Family Medicine

## 2018-08-22 ENCOUNTER — Other Ambulatory Visit: Payer: Self-pay

## 2018-08-22 ENCOUNTER — Encounter: Payer: Self-pay | Admitting: Family Medicine

## 2018-08-22 VITALS — BP 127/84 | HR 87 | Temp 98.2°F | Ht 59.0 in | Wt 193.2 lb

## 2018-08-22 DIAGNOSIS — N3000 Acute cystitis without hematuria: Secondary | ICD-10-CM | POA: Diagnosis not present

## 2018-08-22 DIAGNOSIS — R300 Dysuria: Secondary | ICD-10-CM | POA: Diagnosis not present

## 2018-08-22 DIAGNOSIS — R5383 Other fatigue: Secondary | ICD-10-CM

## 2018-08-22 LAB — POCT URINALYSIS DIP (CLINITEK)
Bilirubin, UA: NEGATIVE
Blood, UA: NEGATIVE
Glucose, UA: NEGATIVE mg/dL
Ketones, POC UA: NEGATIVE mg/dL
Nitrite, UA: NEGATIVE
POC PROTEIN,UA: NEGATIVE
Spec Grav, UA: 1.025
Urobilinogen, UA: 0.2 U/dL
pH, UA: 5.5

## 2018-08-22 MED ORDER — PHENAZOPYRIDINE HCL 100 MG PO TABS: 100.0000 mg | ORAL_TABLET | Freq: Three times a day (TID) | ORAL | 0 refills | Status: DC

## 2018-08-22 MED ORDER — SULFAMETHOXAZOLE-TRIMETHOPRIM 800-160 MG PO TABS: 1.0000 | ORAL_TABLET | Freq: Two times a day (BID) | ORAL | 0 refills | Status: AC

## 2018-08-22 NOTE — Patient Instructions (Signed)
Urinary Tract Infection, Adult A urinary tract infection (UTI) is an infection of any part of the urinary tract. The urinary tract includes:  The kidneys.  The ureters.  The bladder.  The urethra. These organs make, store, and get rid of pee (urine) in the body. What are the causes? This is caused by germs (bacteria) in your genital area. These germs grow and cause swelling (inflammation) of your urinary tract. What increases the risk? You are more likely to develop this condition if:  You have a small, thin tube (catheter) to drain pee.  You cannot control when you pee or poop (incontinence).  You are female, and: ? You use these methods to prevent pregnancy: ? A medicine that kills sperm (spermicide). ? A device that blocks sperm (diaphragm). ? You have low levels of a female hormone (estrogen). ? You are pregnant.  You have genes that add to your risk.  You are sexually active.  You take antibiotic medicines.  You have trouble peeing because of: ? A prostate that is bigger than normal, if you are female. ? A blockage in the part of your body that drains pee from the bladder (urethra). ? A kidney stone. ? A nerve condition that affects your bladder (neurogenic bladder). ? Not getting enough to drink. ? Not peeing often enough.  You have other conditions, such as: ? Diabetes. ? A weak disease-fighting system (immune system). ? Sickle cell disease. ? Gout. ? Injury of the spine. What are the signs or symptoms? Symptoms of this condition include:  Needing to pee right away (urgently).  Peeing often.  Peeing small amounts often.  Pain or burning when peeing.  Blood in the pee.  Pee that smells bad or not like normal.  Trouble peeing.  Pee that is cloudy.  Fluid coming from the vagina, if you are female.  Pain in the belly or lower back. Other symptoms include:  Throwing up (vomiting).  No urge to eat.  Feeling mixed up (confused).  Being tired  and grouchy (irritable).  A fever.  Watery poop (diarrhea). How is this treated? This condition may be treated with:  Antibiotic medicine.  Other medicines.  Drinking enough water. Follow these instructions at home:  Medicines  Take over-the-counter and prescription medicines only as told by your doctor.  If you were prescribed an antibiotic medicine, take it as told by your doctor. Do not stop taking it even if you start to feel better. General instructions  Make sure you: ? Pee until your bladder is empty. ? Do not hold pee for a long time. ? Empty your bladder after sex. ? Wipe from front to back after pooping if you are a female. Use each tissue one time when you wipe.  Drink enough fluid to keep your pee pale yellow.  Keep all follow-up visits as told by your doctor. This is important. Contact a doctor if:  You do not get better after 1-2 days.  Your symptoms go away and then come back. Get help right away if:  You have very bad back pain.  You have very bad pain in your lower belly.  You have a fever.  You are sick to your stomach (nauseous).  You are throwing up. Summary  A urinary tract infection (UTI) is an infection of any part of the urinary tract.  This condition is caused by germs in your genital area.  There are many risk factors for a UTI. These include having a small, thin   tube to drain pee and not being able to control when you pee or poop.  Treatment includes antibiotic medicines for germs.  Drink enough fluid to keep your pee pale yellow. This information is not intended to replace advice given to you by your health care provider. Make sure you discuss any questions you have with your health care provider. Document Released: 08/05/2007 Document Revised: 08/26/2017 Document Reviewed: 08/26/2017 Elsevier Interactive Patient Education  2019 [employer]  

## 2018-08-22 NOTE — Progress Notes (Signed)
Established Patient Office Visit  Subjective:  Patient ID: Sherri [name], female    DOB: [birth year]  Age: 33 y.o. MRN: [id number]  CC:  Chief Complaint  Patient presents with  . vaginal burning  . Medication Refill    HPI Sherri [name] presents for complaint of 1 week or more of burning with urination, lower abdominal discomfort and back pain.  Patient reports that the burning sensation only occurs with urination.  She has noticed some mild increase in clear vaginal discharge.  She is having dull, pain in her left mid back.  She does have a history of recurrent urinary tract infections.  Patient also with complaint of continued fatigue.  She would like to have refill on her prescription vitamin D.  Past Medical History:  Diagnosis Date  . Fibromyalgia    denies today  . Genital herpes   . Hypertension    gestational  . 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 Urinary tract infection   . Uterine fibroid     Past Surgical History:  Procedure Laterality Date  . CESAREAN SECTION N/A 06/05/2014   Procedure: CESAREAN SECTION;  Surgeon: Truett Mainland, DO;  Location: [facility];  Service: Obstetrics;  Laterality: N/A;  . WISDOM TOOTH EXTRACTION      Family History  Problem Relation Age of Onset  . Hypertension Mother   . Hypertension Father   . Breast cancer Maternal Grandmother        92  . Colon cancer Neg Hx   . Colon polyps Neg Hx   . Kidney disease Neg Hx   . Diabetes Neg Hx   . Esophageal cancer Neg Hx   . Gallbladder disease Neg Hx   . Heart disease Neg Hx   . Asthma Neg Hx   . Cancer Neg Hx   . Stroke Neg Hx     Social History   Tobacco Use  . Smoking status: Never Smoker  . Smokeless tobacco: Never Used  Substance Use Topics  . Alcohol use: No  . Drug use: No    Outpatient Medications Prior to Visit  Medication Sig Dispense Refill  . cetirizine-pseudoephedrine (ZYRTEC-D) 5-120 MG tablet Take 1 tablet by mouth daily. 30 tablet 0   . cyclobenzaprine (FLEXERIL) 10 MG tablet Take 1 tablet (10 mg total) by mouth 2 (two) times daily as needed for muscle spasms. 20 tablet 0  . esomeprazole (NEXIUM) 40 MG capsule Take 1 capsule (40 mg total) by mouth daily. To reduce stomach acid 30 capsule 5  . fluticasone (FLONASE) 50 MCG/ACT nasal spray Place 1 spray into both nostrils daily. 16 g 2  . naproxen (NAPROSYN) 375 MG tablet Take 1 tablet (375 mg total) by mouth 2 (two) times daily. 20 tablet 0  . ondansetron (ZOFRAN) 4 MG tablet Take 1 tablet (4 mg total) by mouth every 6 (six) hours. 12 tablet 0  . phenazopyridine (PYRIDIUM) 100 MG tablet Take 1 tablet (100 mg total) by mouth 3 (three) times daily. For 2 days to decrease pain with urination 6 tablet 0  . polyethylene glycol (MIRALAX) packet Take 17 g by mouth daily. 14 each 0  . traZODone (DESYREL) 50 MG tablet Take 0.5-1 tablets (25-50 mg total) by mouth at bedtime as needed for sleep. 30 tablet 3  . Vitamin D, Ergocalciferol, (DRISDOL) 1.25 MG (50000 UT) CAPS capsule TAKE 1 CAPSULE ONCE WEEKLY  0   No facility-administered medications prior to visit.  No Known Allergies  ROS Review of Systems  Constitutional: Positive for fatigue. Negative for chills and fever.  HENT: Negative for sore throat and trouble swallowing.   Respiratory: Negative for cough and shortness of breath.   Gastrointestinal: Positive for abdominal pain. Negative for constipation, diarrhea and nausea.  Endocrine: Positive for polyuria. Negative for polydipsia and polyphagia.  Genitourinary: Positive for dysuria and frequency.  Musculoskeletal: Positive for back pain. Negative for gait problem.  Neurological: Negative for dizziness and headaches.  Hematological: Negative for adenopathy.      Objective:    Physical Exam  Constitutional: She is oriented to person, place, and time. She appears well-nourished. No distress.  Overweight for height/obese  Cardiovascular: Normal rate and regular  rhythm.  Pulmonary/Chest: Effort normal and breath sounds normal.  Abdominal: Soft. There is abdominal tenderness. There is no rebound and no guarding.  Truncal obesity, complain of some generalized tenderness with palpation and examination but area of greatest discomfort with palpation was in the suprapubic area  Musculoskeletal: Normal range of motion.        General: Tenderness (Left mid back in CVA area) present.     Comments: Left CVA tenderness  Neurological: She is alert and oriented to person, place, and time.  Skin: Skin is warm and dry.  Nursing note and vitals reviewed.   BP 127/84 (BP Location: Right Arm, Patient Position: Sitting, Cuff Size: Large)   Pulse 87   Temp 98.2 F (36.8 C) (Oral)   Ht 4\' 11"  (1.499 m)   Wt 193 lb 3.2 oz (87.6 kg)   LMP 08/01/2018   SpO2 98%   BMI 39.02 kg/m  Wt Readings from Last 3 Encounters:  08/22/18 193 lb 3.2 oz (87.6 kg)  08/20/18 170 lb (77.1 kg)  05/20/18 176 lb 6.4 oz (80 kg)     Health Maintenance Due  Topic Date Due  . PAP SMEAR-Modifier  07/17/2017    There are no preventive care reminders to display for this patient.  Lab Results  Component Value Date   TSH 1.180 04/06/2018   Lab Results  Component Value Date   WBC 6.0 04/06/2018   HGB 11.9 04/06/2018   HCT 36.1 04/06/2018   MCV 85 04/06/2018   PLT 323 04/06/2018   Lab Results  Component Value Date   NA 139 04/06/2018   K 4.2 04/06/2018   CO2 21 04/06/2018   GLUCOSE 84 04/06/2018   BUN 5 (L) 04/06/2018   CREATININE 0.79 04/06/2018   BILITOT 0.3 05/12/2017   ALKPHOS 76 05/12/2017   AST 20 05/12/2017   ALT 14 05/12/2017   PROT 7.9 05/12/2017   ALBUMIN 4.3 05/12/2017   CALCIUM 9.3 04/06/2018   ANIONGAP 6 10/31/2017   Lab Results  Component Value Date   CHOL 198 01/07/2017   Lab Results  Component Value Date   HDL 56 01/07/2017   Lab Results  Component Value Date   LDLCALC 128 (H) 01/07/2017   Lab Results  Component Value Date   TRIG 57  01/07/2017   Lab Results  Component Value Date   CHOLHDL 3.5 01/07/2017   Lab Results  Component Value Date   HGBA1C 5.1 01/06/2018      Assessment & Plan:  1. Dysuria Patient with complaint of dysuria and also with complaint of back pain and increased fatigue.  Urinalysis done at today's visit which showed moderate leukocytes.  Patient will be treated for urinary tract infection and urine will be sent for culture.  Pyridium prescribed to help with dysuria - POCT URINALYSIS DIP (CLINITEK) - phenazopyridine (PYRIDIUM) 100 MG tablet; Take 1 tablet (100 mg total) by mouth 3 (three) times daily. For 2 days to decrease pain with urination  Dispense: 6 tablet; Refill: 0  2. Acute cystitis without hematuria Patient with complaint of dysuria and on examination, patient with left CVA tenderness and suprapubic discomfort to palpation.  Urinalysis showed moderate leukocytes.  Patient has been placed on Septra DS twice daily x7 days for treatment of urinary tract infection.  Urine will be sent for culture and she will be notified if a change in antibiotic therapy is needed based on these results.  She is encouraged to remain well-hydrated with water.  Prescription for Pyridium for burning with urination.  She should follow-up later this week if symptoms worsen otherwise 1 week if she does not feel as if symptoms have completely resolved. - Urine Culture - sulfamethoxazole-trimethoprim (BACTRIM DS) 800-160 MG tablet; Take 1 tablet by mouth 2 (two) times daily for 7 days.  Dispense: 14 tablet; Refill: 0 - phenazopyridine (PYRIDIUM) 100 MG tablet; Take 1 tablet (100 mg total) by mouth 3 (three) times daily. For 2 days to decrease pain with urination  Dispense: 6 tablet; Refill: 0  3. Fatigue, unspecified type Patient with complaint of continued fatigue which has been a longstanding issue.  Patient requested refill of vitamin D however her vitamin D level in February was 51.  If she chooses to do so she can  take an over-the-counter vitamin D3 supplement of 4000 IUs daily but otherwise she does not need prescription vitamin D therapy at this time  An After Visit Summary was printed and given to the patient.  Follow-up: Return in about 1 week (around 08/29/2018), or if symptoms worsen or fail to improve, for uti if symptoms have not improved; sooner if symptoms are worse.   Antony Blackbird, MD

## 2018-08-22 NOTE — Progress Notes (Signed)
Burning when she urinates a lot for the past 2 wks.   Med refills on Vit D

## 2018-08-24 LAB — URINE CULTURE

## 2018-08-25 ENCOUNTER — Telehealth: Payer: Self-pay | Admitting: Emergency Medicine

## 2018-08-25 NOTE — Telephone Encounter (Signed)
Patient contacted via phone to be given results of labs.  Patient identified by name and date of birth.  Patient given results of labs.  Patient educated on lab results. Questions answered. Patient acknowledged understanding of labs results. 

## 2018-10-10 ENCOUNTER — Telehealth (HOSPITAL_COMMUNITY): Payer: Self-pay | Admitting: Emergency Medicine

## 2018-10-10 ENCOUNTER — Encounter (HOSPITAL_COMMUNITY): Payer: Self-pay

## 2018-10-10 ENCOUNTER — Ambulatory Visit (HOSPITAL_COMMUNITY)
Admission: EM | Admit: 2018-10-10 | Discharge: 2018-10-10 | Disposition: A | Discharge status: Home / Self Care | Payer: PRIVATE HEALTH INSURANCE | Attending: Emergency Medicine | Admitting: Emergency Medicine

## 2018-10-10 ENCOUNTER — Encounter (HOSPITAL_COMMUNITY): Payer: Self-pay | Admitting: Emergency Medicine

## 2018-10-10 ENCOUNTER — Other Ambulatory Visit: Payer: Self-pay

## 2018-10-10 DIAGNOSIS — R319 Hematuria, unspecified: Secondary | ICD-10-CM | POA: Diagnosis not present

## 2018-10-10 DIAGNOSIS — Z79899 Other long term (current) drug therapy: Secondary | ICD-10-CM | POA: Insufficient documentation

## 2018-10-10 DIAGNOSIS — Z3202 Encounter for pregnancy test, result negative: Secondary | ICD-10-CM | POA: Diagnosis not present

## 2018-10-10 DIAGNOSIS — N390 Urinary tract infection, site not specified: Secondary | ICD-10-CM | POA: Diagnosis not present

## 2018-10-10 DIAGNOSIS — N3000 Acute cystitis without hematuria: Secondary | ICD-10-CM | POA: Insufficient documentation

## 2018-10-10 DIAGNOSIS — I10 Essential (primary) hypertension: Secondary | ICD-10-CM | POA: Insufficient documentation

## 2018-10-10 LAB — POCT URINALYSIS DIP (DEVICE)
Bilirubin Urine: NEGATIVE
Glucose, UA: NEGATIVE mg/dL
Ketones, ur: NEGATIVE mg/dL
Nitrite: NEGATIVE
Protein, ur: NEGATIVE mg/dL
Specific Gravity, Urine: >=1.03 (ref 1.005–1.030)
Urobilinogen, UA: 0.2 mg/dL (ref 0.0–1.0)
pH: 5.5 (ref 5.0–8.0)

## 2018-10-10 LAB — POCT PREGNANCY, URINE: Preg Test, Ur: NEGATIVE

## 2018-10-10 MED ORDER — ONDANSETRON HCL 4 MG PO TABS: 4.0000 mg | ORAL_TABLET | Freq: Three times a day (TID) | ORAL | 0 refills | Status: DC | PRN

## 2018-10-10 MED ORDER — NITROFURANTOIN MONOHYD MACRO 100 MG PO CAPS: 100.0000 mg | ORAL_CAPSULE | Freq: Two times a day (BID) | ORAL | 0 refills | Status: DC

## 2018-10-10 NOTE — ED Triage Notes (Signed)
Pt presents with burning and discomfort when urinating for the past few days.

## 2018-10-10 NOTE — ED Triage Notes (Signed)
Pt reports that she was seen at [facility] today for UTI and started on Macrobid. Reports nausea and feeling weak after taking medications. Pt c/o abd pains headache.

## 2018-10-10 NOTE — Telephone Encounter (Signed)
Pt called stating she did not feel well after taking antibiotics; per KT to send in zofran; pt requesting new antibiotic; pt informed that provider thinks should stay on macrobid and take nausea meds

## 2018-10-10 NOTE — Discharge Instructions (Addendum)
Take the antibiotic Macrobid as prescribed.    Your urine culture is being sent.  We will call you if your antibiotic needs to be changed.    Return here or follow-up with your primary care provider if you develop vaginal discharge, abdominal pain, flank pain, fever, chills, or other concerning symptoms.

## 2018-10-10 NOTE — ED Provider Notes (Signed)
[place]    CSN: [id number] Arrival date & time: 10/10/18  9390     History   Chief Complaint Chief Complaint  Patient presents with  . Urinary Tract Infection    HPI Sherri [name] is a 33 y.o. female.   Patient presents with dysuria x3 days.  She denies vaginal discharge, abdominal pain, flank pain, fever, chills.  LMP: 4 weeks.  She is sexually active in a monogamous relationship; states she uses condoms consistently and has no concern for STDs.  No aggravating or alleviating factors; no treatments attempted at home.    The history is provided by the patient.    Past Medical History:  Diagnosis Date  . Fibromyalgia    denies today  . Genital herpes   . Hypertension    gestational  . 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 Urinary tract infection   . Uterine fibroid     Patient Active Problem List   Diagnosis Date Noted  . Hypertension   . Genital herpes   . Yeast infection 12/03/2016  . Status post cesarean delivery 06/06/2014  . Uterine fibroid 12/25/2013  . Obesity 12/25/2013    Past Surgical History:  Procedure Laterality Date  . CESAREAN SECTION N/A 06/05/2014   Procedure: CESAREAN SECTION;  Surgeon: Truett Mainland, DO;  Location: [facility];  Service: Obstetrics;  Laterality: N/A;  . WISDOM TOOTH EXTRACTION      OB History    Gravida  1   Para  1   Term      Preterm  1   AB      Living  2     SAB      TAB      Ectopic      Multiple  1   Live Births  2            Home Medications    Prior to Admission medications   Medication Sig Start Date End Date Taking? Authorizing Provider  cetirizine-pseudoephedrine (ZYRTEC-D) 5-120 MG tablet Take 1 tablet by mouth daily. 03/13/18   Loura Halt A, NP  cyclobenzaprine (FLEXERIL) 10 MG tablet Take 1 tablet (10 mg total) by mouth 2 (two) times daily as needed for muscle spasms. 08/20/18   Chase Picket, MD  esomeprazole (NEXIUM) 40 MG capsule Take 1  capsule (40 mg total) by mouth daily. To reduce stomach acid 03/07/18   Fulp, Cammie, MD  fluticasone (FLONASE) 50 MCG/ACT nasal spray Place 1 spray into both nostrils daily. 12/31/17   Loura Halt A, NP  naproxen (NAPROSYN) 375 MG tablet Take 1 tablet (375 mg total) by mouth 2 (two) times daily. 08/20/18   LampteyMyrene Galas, MD  nitrofurantoin, macrocrystal-monohydrate, (MACROBID) 100 MG capsule Take 1 capsule (100 mg total) by mouth 2 (two) times daily. 10/10/18   Sharion Balloon, NP  ondansetron (ZOFRAN) 4 MG tablet Take 1 tablet (4 mg total) by mouth every 6 (six) hours. 12/31/17   Loura Halt A, NP  phenazopyridine (PYRIDIUM) 100 MG tablet Take 1 tablet (100 mg total) by mouth 3 (three) times daily. For 2 days to decrease pain with urination 08/22/18   Fulp, Cammie, MD  polyethylene glycol (MIRALAX) packet Take 17 g by mouth daily. 02/26/18   Tasia Catchings, Amy V, PA-C  traZODone (DESYREL) 50 MG tablet Take 0.5-1 tablets (25-50 mg total) by mouth at bedtime as needed for sleep. 02/02/18   Fulp, Ander Gaster, MD  Vitamin D, Ergocalciferol, (DRISDOL)  1.25 MG (50000 UT) CAPS capsule TAKE 1 CAPSULE ONCE WEEKLY 01/02/18   [provider]    Family History Family History  Problem Relation Age of Onset  . Hypertension Mother   . Hypertension Father   . Breast cancer Maternal Grandmother        61  . Colon cancer Neg Hx   . Colon polyps Neg Hx   . Kidney disease Neg Hx   . Diabetes Neg Hx   . Esophageal cancer Neg Hx   . Gallbladder disease Neg Hx   . Heart disease Neg Hx   . Asthma Neg Hx   . Cancer Neg Hx   . Stroke Neg Hx     Social History Social History   Tobacco Use  . Smoking status: Never Smoker  . Smokeless tobacco: Never Used  Substance Use Topics  . Alcohol use: No  . Drug use: No     Allergies   Patient has no known allergies.   Review of Systems Review of Systems  Constitutional: Negative for chills and fever.  HENT: Negative for ear pain and sore throat.   Eyes: Negative for  pain and visual disturbance.  Respiratory: Negative for cough and shortness of breath.   Cardiovascular: Negative for chest pain and palpitations.  Gastrointestinal: Negative for abdominal pain, diarrhea and vomiting.  Genitourinary: Positive for dysuria. Negative for flank pain, hematuria, pelvic pain and vaginal discharge.  Musculoskeletal: Negative for arthralgias and back pain.  Skin: Negative for color change and rash.  Neurological: Negative for seizures and syncope.  All other systems reviewed and are negative.    Physical Exam Triage Vital Signs ED Triage Vitals  Enc Vitals Group     BP 10/10/18 0815 (!) 146/84     Pulse Rate 10/10/18 0815 98     Resp 10/10/18 0815 16     Temp 10/10/18 0815 98.1 F (36.7 C)     Temp Source 10/10/18 0815 Temporal     SpO2 10/10/18 0815 99 %     Weight --      Height --      Head Circumference --      Peak Flow --      Pain Score 10/10/18 0822 6     Pain Loc --      Pain Edu? --      Excl. in [place]? --    No data found.  Updated Vital Signs BP (!) 146/84 (BP Location: Left Arm)   Pulse 98   Temp 98.1 F (36.7 C) (Temporal)   Resp 16   LMP 09/10/2018   SpO2 99%   Visual Acuity Right Eye Distance:   Left Eye Distance:   Bilateral Distance:    Right Eye Near:   Left Eye Near:    Bilateral Near:     Physical Exam Vitals signs and nursing note reviewed.  Constitutional:      General: She is not in acute distress.    Appearance: She is well-developed.  HENT:     Head: Normocephalic and atraumatic.     Mouth/Throat:     Mouth: Mucous membranes are moist.     Pharynx: Oropharynx is clear.  Eyes:     Conjunctiva/sclera: Conjunctivae normal.  Neck:     Musculoskeletal: Neck supple.  Cardiovascular:     Rate and Rhythm: Normal rate and regular rhythm.     Heart sounds: No murmur.  Pulmonary:     Effort: Pulmonary effort is normal. No respiratory  distress.     Breath sounds: Normal breath sounds.  Abdominal:      Palpations: Abdomen is soft.     Tenderness: There is no abdominal tenderness. There is no right CVA tenderness, left CVA tenderness, guarding or rebound.  Skin:    General: Skin is warm and dry.     Findings: No rash.  Neurological:     Mental Status: She is alert.      UC Treatments / Results  Labs (all labs ordered are listed, but only abnormal results are displayed) Labs Reviewed  POCT URINALYSIS DIP (DEVICE) - Abnormal; Notable for the following components:      Result Value   Hgb urine dipstick TRACE (*)    Leukocytes,Ua SMALL (*)    All other components within normal limits  URINE CULTURE  POC URINE PREG, ED  POCT PREGNANCY, URINE    EKG   Radiology No results found.  Procedures Procedures (including critical care time)  Medications Ordered in UC Medications - No data to display  Initial Impression / Assessment and Plan / UC Course  I have reviewed the triage vital signs and the nursing notes.  Pertinent labs & imaging results that were available during my care of the patient were reviewed by me and considered in my medical decision making (see chart for details).   UTI.  Treating with Macrobid.  Urine culture sent.  Instructed patient to return here or follow-up with her PCP if she develops fever, chills, abdominal pain, flank pain, vaginal discharge, or other concerning symptoms.     Final Clinical Impressions(s) / UC Diagnoses   Final diagnoses:  Urinary tract infection with hematuria, site unspecified     Discharge Instructions     Take the antibiotic Macrobid as prescribed.    Your urine culture is being sent.  We will call you if your antibiotic needs to be changed.    Return here or follow-up with your primary care provider if you develop vaginal discharge, abdominal pain, flank pain, fever, chills, or other concerning symptoms.        ED Prescriptions    Medication Sig Dispense Auth. Provider   nitrofurantoin, macrocrystal-monohydrate,  (MACROBID) 100 MG capsule Take 1 capsule (100 mg total) by mouth 2 (two) times daily. 10 capsule Sharion Balloon, NP     Controlled Substance Prescriptions [place] Controlled Substance Registry consulted? Not Applicable   Sharion Balloon, NP 10/10/18 [phone]

## 2018-10-11 ENCOUNTER — Emergency Department (HOSPITAL_COMMUNITY)
Admission: EM | Admit: 2018-10-11 | Discharge: 2018-10-11 | Disposition: A | Discharge status: Home / Self Care | Payer: PRIVATE HEALTH INSURANCE | Attending: Emergency Medicine | Admitting: Emergency Medicine

## 2018-10-11 DIAGNOSIS — R300 Dysuria: Secondary | ICD-10-CM

## 2018-10-11 DIAGNOSIS — N3000 Acute cystitis without hematuria: Secondary | ICD-10-CM

## 2018-10-11 LAB — URINALYSIS, ROUTINE W REFLEX MICROSCOPIC
Bilirubin Urine: NEGATIVE
Glucose, UA: NEGATIVE mg/dL
Hgb urine dipstick: NEGATIVE
Ketones, ur: NEGATIVE mg/dL
Nitrite: NEGATIVE
Protein, ur: NEGATIVE mg/dL
Specific Gravity, Urine: 1.024 (ref 1.005–1.030)
WBC, UA: >50 WBC/hpf — ABNORMAL HIGH (ref 0–5)
pH: 6 (ref 5.0–8.0)

## 2018-10-11 LAB — BASIC METABOLIC PANEL
Anion gap: 11 (ref 5–15)
BUN: 13 mg/dL (ref 6–20)
CO2: 22 mmol/L (ref 22–32)
Calcium: 9.5 mg/dL (ref 8.9–10.3)
Chloride: 104 mmol/L (ref 98–111)
Creatinine, Ser: 0.76 mg/dL (ref 0.44–1.00)
GFR calc Af Amer: >60 mL/min (ref >60)
GFR calc non Af Amer: >60 mL/min (ref >60)
Glucose, Bld: 111 mg/dL — ABNORMAL HIGH (ref 70–99)
Potassium: 3.6 mmol/L (ref 3.5–5.1)
Sodium: 137 mmol/L (ref 135–145)

## 2018-10-11 LAB — CBC WITH DIFFERENTIAL/PLATELET
Abs Immature Granulocytes: 0.02 10*3/uL (ref 0.00–0.07)
Basophils Absolute: 0.1 10*3/uL (ref 0.0–0.1)
Basophils Relative: 1 %
Eosinophils Absolute: 0.2 10*3/uL (ref 0.0–0.5)
Eosinophils Relative: 2 %
HCT: 39.9 % (ref 36.0–46.0)
Hemoglobin: 12.5 g/dL (ref 12.0–15.0)
Immature Granulocytes: 0 %
Lymphocytes Relative: 33 %
Lymphs Abs: 3 10*3/uL (ref 0.7–4.0)
MCH: 27.6 pg (ref 26.0–34.0)
MCHC: 31.3 g/dL (ref 30.0–36.0)
MCV: 88.1 fL (ref 80.0–100.0)
Monocytes Absolute: 0.7 10*3/uL (ref 0.1–1.0)
Monocytes Relative: 7 %
Neutro Abs: 5.3 10*3/uL (ref 1.7–7.7)
Neutrophils Relative %: 57 %
Platelets: 298 10*3/uL (ref 150–400)
RBC: 4.53 MIL/uL (ref 3.87–5.11)
RDW: 15.2 % (ref 11.5–15.5)
WBC: 9.2 10*3/uL (ref 4.0–10.5)
nRBC: 0 % (ref 0.0–0.2)

## 2018-10-11 LAB — URINE CULTURE

## 2018-10-11 MED ORDER — ONDANSETRON 4 MG PO TBDP: 4.0000 mg | ORAL_TABLET | Freq: Three times a day (TID) | ORAL | 0 refills | Status: DC | PRN

## 2018-10-11 MED ORDER — PROMETHAZINE HCL 25 MG/ML IJ SOLN
25.0000 mg | Freq: Once | INTRAMUSCULAR | Status: AC
  Administered 2018-10-11: 25 mg via INTRAMUSCULAR
  Filled 2018-10-11: qty 1

## 2018-10-11 MED ORDER — CEPHALEXIN 500 MG PO CAPS: 500.0000 mg | ORAL_CAPSULE | Freq: Two times a day (BID) | ORAL | 0 refills | Status: DC

## 2018-10-11 MED ORDER — METOCLOPRAMIDE HCL 10 MG PO TABS
10.0000 mg | ORAL_TABLET | Freq: Once | ORAL | Status: AC
  Administered 2018-10-11: 10 mg via ORAL
  Filled 2018-10-11: qty 1

## 2018-10-11 MED ORDER — NAPROXEN 500 MG PO TABS
500.0000 mg | ORAL_TABLET | Freq: Once | ORAL | Status: AC
  Administered 2018-10-11: 500 mg via ORAL
  Filled 2018-10-11: qty 1

## 2018-10-11 MED ORDER — CEPHALEXIN 500 MG PO CAPS
500.0000 mg | ORAL_CAPSULE | Freq: Once | ORAL | Status: AC
  Administered 2018-10-11: 500 mg via ORAL
  Filled 2018-10-11: qty 1

## 2018-10-11 NOTE — ED Notes (Signed)
Patient is complaining of burning when urinating.

## 2018-10-11 NOTE — Discharge Instructions (Addendum)
Discontinue your previously prescribed antibiotic.  Take Keflex as prescribed.  You may use Zofran for management of nausea.  Follow-up with your primary care doctor to ensure resolution of symptoms.

## 2018-10-11 NOTE — ED Provider Notes (Signed)
[facility] DEPT Provider Note   CSN: [id number] Arrival date & time: 10/10/18  1743    History   Chief Complaint Chief Complaint  Patient presents with  . Medication Reaction    fatigue    HPI Sherri [name] is a 33 y.o. female.     33 year old female presents to the emergency department for evaluation.  She reports that she has had dysuria x4 days.  Symptoms associated with lower abdominal discomfort.  She went to urgent care for evaluation yesterday.  Was prescribed Macrobid.  Took 1 dose of this antibiotic, but vomited shortly after.  She has now had a throbbing headache since onset of emesis.  Her emesis has not been persistent.  She denies any fevers, melena, hematochezia, new or worsening vaginal discharge.  She is sexually active with one partner and denies concern for STDs.  Abdominal surgical history significant for cesarean section.  The history is provided by the patient. No language interpreter was used.    Past Medical History:  Diagnosis Date  . Fibromyalgia    denies today  . Genital herpes   . Hypertension    gestational  . 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 Urinary tract infection   . Uterine fibroid     Patient Active Problem List   Diagnosis Date Noted  . Hypertension   . Genital herpes   . Yeast infection 12/03/2016  . Status post cesarean delivery 06/06/2014  . Uterine fibroid 12/25/2013  . Obesity 12/25/2013    Past Surgical History:  Procedure Laterality Date  . CESAREAN SECTION N/A 06/05/2014   Procedure: CESAREAN SECTION;  Surgeon: Truett Mainland, DO;  Location: [facility];  Service: Obstetrics;  Laterality: N/A;  . WISDOM TOOTH EXTRACTION       OB History    Gravida  1   Para  1   Term      Preterm  1   AB      Living  2     SAB      TAB      Ectopic      Multiple  1   Live Births  2            Home Medications    Prior to Admission medications    Medication Sig Start Date End Date Taking? Authorizing Provider  cephALEXin (KEFLEX) 500 MG capsule Take 1 capsule (500 mg total) by mouth 2 (two) times daily. 10/11/18   Antonietta Breach, PA-C  cetirizine-pseudoephedrine (ZYRTEC-D) 5-120 MG tablet Take 1 tablet by mouth daily. 03/13/18   Loura Halt A, NP  cyclobenzaprine (FLEXERIL) 10 MG tablet Take 1 tablet (10 mg total) by mouth 2 (two) times daily as needed for muscle spasms. 08/20/18   Chase Picket, MD  esomeprazole (NEXIUM) 40 MG capsule Take 1 capsule (40 mg total) by mouth daily. To reduce stomach acid 03/07/18   Fulp, Cammie, MD  fluticasone (FLONASE) 50 MCG/ACT nasal spray Place 1 spray into both nostrils daily. 12/31/17   Loura Halt A, NP  naproxen (NAPROSYN) 375 MG tablet Take 1 tablet (375 mg total) by mouth 2 (two) times daily. 08/20/18   Chase Picket, MD  ondansetron (ZOFRAN ODT) 4 MG disintegrating tablet Take 1 tablet (4 mg total) by mouth every 8 (eight) hours as needed for nausea or vomiting. 10/11/18   Antonietta Breach, PA-C  ondansetron (ZOFRAN) 4 MG tablet Take 1 tablet (4 mg total) by mouth every  6 (six) hours. 12/31/17   Loura Halt A, NP  ondansetron (ZOFRAN) 4 MG tablet Take 1 tablet (4 mg total) by mouth every 8 (eight) hours as needed for nausea or vomiting. 10/10/18   Sharion Balloon, NP  phenazopyridine (PYRIDIUM) 100 MG tablet Take 1 tablet (100 mg total) by mouth 3 (three) times daily. For 2 days to decrease pain with urination 08/22/18   Fulp, Cammie, MD  polyethylene glycol (MIRALAX) packet Take 17 g by mouth daily. 02/26/18   Tasia Catchings, Amy V, PA-C  traZODone (DESYREL) 50 MG tablet Take 0.5-1 tablets (25-50 mg total) by mouth at bedtime as needed for sleep. 02/02/18   Fulp, Cammie, MD  Vitamin D, Ergocalciferol, (DRISDOL) 1.25 MG (50000 UT) CAPS capsule TAKE 1 CAPSULE ONCE WEEKLY 01/02/18   [provider]    Family History Family History  Problem Relation Age of Onset  . Hypertension Mother   . Hypertension Father   .  Breast cancer Maternal Grandmother        43  . Colon cancer Neg Hx   . Colon polyps Neg Hx   . Kidney disease Neg Hx   . Diabetes Neg Hx   . Esophageal cancer Neg Hx   . Gallbladder disease Neg Hx   . Heart disease Neg Hx   . Asthma Neg Hx   . Cancer Neg Hx   . Stroke Neg Hx     Social History Social History   Tobacco Use  . Smoking status: Never Smoker  . Smokeless tobacco: Never Used  Substance Use Topics  . Alcohol use: No  . Drug use: No     Allergies   Patient has no known allergies.   Review of Systems Review of Systems Ten systems reviewed and are negative for acute change, except as noted in the HPI.    Physical Exam Updated Vital Signs BP 134/84 (BP Location: Left Arm)   Pulse 81   Temp 99.4 F (37.4 C) (Oral)   Resp 17   Ht 4\' 11"  (1.499 m)   Wt 80.7 kg   SpO2 100%   BMI 35.95 kg/m   Physical Exam Vitals signs and nursing note reviewed.  Constitutional:      General: She is not in acute distress.    Appearance: She is well-developed. She is not diaphoretic.     Comments: Nontoxic appearing and in NAD  HENT:     Head: Normocephalic and atraumatic.  Eyes:     General: No scleral icterus.    Conjunctiva/sclera: Conjunctivae normal.  Neck:     Musculoskeletal: Normal range of motion.  Cardiovascular:     Rate and Rhythm: Normal rate and regular rhythm.     Pulses: Normal pulses.  Pulmonary:     Effort: Pulmonary effort is normal. No respiratory distress.     Comments: Respirations even and unlabored Abdominal:     Comments: Abdomen soft, obese. There is TTP in the lower abdomen. No masses or peritoneal signs.  Musculoskeletal: Normal range of motion.  Skin:    General: Skin is warm and dry.     Coloration: Skin is not pale.     Findings: No erythema or rash.  Neurological:     General: No focal deficit present.     Mental Status: She is alert and oriented to person, place, and time.     Coordination: Coordination normal.   Psychiatric:        Behavior: Behavior normal.  ED Treatments / Results  Labs (all labs ordered are listed, but only abnormal results are displayed) Labs Reviewed  BASIC METABOLIC PANEL - Abnormal; Notable for the following components:      Result Value   Glucose, Bld 111 (*)    All other components within normal limits  URINALYSIS, ROUTINE W REFLEX MICROSCOPIC - Abnormal; Notable for the following components:   APPearance HAZY (*)    Leukocytes,Ua MODERATE (*)    WBC, UA >50 (*)    Bacteria, UA RARE (*)    All other components within normal limits  CBC WITH DIFFERENTIAL/PLATELET    EKG None  Radiology No results found.  Procedures Procedures (including critical care time)  Medications Ordered in ED Medications  cephALEXin (KEFLEX) capsule 500 mg (has no administration in time range)  promethazine (PHENERGAN) injection 25 mg (has no administration in time range)  naproxen (NAPROSYN) tablet 500 mg (500 mg Oral Given 10/11/18 0216)  metoCLOPramide (REGLAN) tablet 10 mg (10 mg Oral Given 10/11/18 0216)    3:05 AM Patient reporting persistent nausea.  Will give IM Phenergan with first dose of antibiotics.  Despite nausea, she has tolerated a sandwich and oral fluids.  Will give short course of Zofran for outpatient use.   Initial Impression / Assessment and Plan / ED Course  I have reviewed the triage vital signs and the nursing notes.  Pertinent labs & imaging results that were available during my care of the patient were reviewed by me and considered in my medical decision making (see chart for details).        Patient has been diagnosed with a UTI.  Labs today reassuring without leukocytosis.  Kidney function preserved.  She has no peritoneal signs on abdominal exam.  Tolerating oral food and fluids.  Patient to be discharged home with antibiotics and instructions to follow up with PCP if symptoms persist.  Return precautions discussed and provided. Patient  discharged in stable condition with no unaddressed concerns.   Final Clinical Impressions(s) / ED Diagnoses   Final diagnoses:  Dysuria  Acute cystitis without hematuria    ED Discharge Orders         Ordered    cephALEXin (KEFLEX) 500 MG capsule  2 times daily     10/11/18 0247    ondansetron (ZOFRAN ODT) 4 MG disintegrating tablet  Every 8 hours PRN     10/11/18 0247           Antonietta Breach, PA-C 10/11/18 0306    Fatima Blank, MD 10/11/18 0630

## 2018-10-11 NOTE — ED Notes (Signed)
Gave patient water to drink

## 2018-10-14 ENCOUNTER — Telehealth: Payer: Self-pay

## 2018-10-14 NOTE — Telephone Encounter (Signed)
Called patient to do their pre-visit COVID screening.  Have you been tested for COVID or are you currently waiting for COVID test results? no  Have you recently traveled internationally([place], [place], [place], [place], [place]) or within the [place] to a hotspot area([place], [place], [place], [place], [place])? no  Are you currently experiencing any of the following: fever, cough, SHOB, fatigue, body aches, loss of smell, rash, diarrhea, vomiting, severe headaches, weakness, sore throat? no  Have you been in contact with anyone who has recently travelled? no  Have you been in contact with anyone who is experiencing any of the above symptoms or been diagnosed with COVID  or works in or has recently visited a SNF? no  

## 2018-10-17 ENCOUNTER — Encounter: Payer: Self-pay | Admitting: Family Medicine

## 2018-10-17 ENCOUNTER — Other Ambulatory Visit (HOSPITAL_COMMUNITY)
Admission: RE | Admit: 2018-10-17 | Discharge: 2018-10-17 | Disposition: A | Discharge status: Home / Self Care | Payer: Self-pay | Source: Ambulatory Visit | Attending: Family Medicine | Admitting: Family Medicine

## 2018-10-17 ENCOUNTER — Ambulatory Visit: Payer: Self-pay | Admitting: Family Medicine

## 2018-10-17 ENCOUNTER — Ambulatory Visit (INDEPENDENT_AMBULATORY_CARE_PROVIDER_SITE_OTHER): Payer: Self-pay | Admitting: Family Medicine

## 2018-10-17 ENCOUNTER — Other Ambulatory Visit: Payer: Self-pay

## 2018-10-17 VITALS — BP 130/85 | HR 98 | Temp 97.5°F | Resp 17 | Ht 59.0 in | Wt 195.2 lb

## 2018-10-17 DIAGNOSIS — N898 Other specified noninflammatory disorders of vagina: Secondary | ICD-10-CM | POA: Insufficient documentation

## 2018-10-17 DIAGNOSIS — R1084 Generalized abdominal pain: Secondary | ICD-10-CM

## 2018-10-17 DIAGNOSIS — R110 Nausea: Secondary | ICD-10-CM

## 2018-10-17 DIAGNOSIS — H6691 Otitis media, unspecified, right ear: Secondary | ICD-10-CM

## 2018-10-17 DIAGNOSIS — N3090 Cystitis, unspecified without hematuria: Secondary | ICD-10-CM

## 2018-10-17 DIAGNOSIS — R300 Dysuria: Secondary | ICD-10-CM

## 2018-10-17 DIAGNOSIS — Z09 Encounter for follow-up examination after completed treatment for conditions other than malignant neoplasm: Secondary | ICD-10-CM

## 2018-10-17 LAB — POCT URINALYSIS DIP (CLINITEK)
Bilirubin, UA: NEGATIVE
Glucose, UA: NEGATIVE mg/dL
Ketones, POC UA: NEGATIVE mg/dL
Nitrite, UA: NEGATIVE
POC PROTEIN,UA: NEGATIVE
Spec Grav, UA: 1.025
Urobilinogen, UA: 0.2 U/dL
pH, UA: 5.5

## 2018-10-17 MED ORDER — NYSTATIN 100000 UNIT/GM EX CREA: 1.0000 "application " | TOPICAL_CREAM | Freq: Three times a day (TID) | CUTANEOUS | 0 refills | Status: DC

## 2018-10-17 MED ORDER — FLUCONAZOLE 100 MG PO TABS: 100.0000 mg | ORAL_TABLET | Freq: Every day | ORAL | 0 refills | Status: AC

## 2018-10-17 MED ORDER — AMOXICILLIN-POT CLAVULANATE 500-125 MG PO TABS: 1.0000 | ORAL_TABLET | Freq: Two times a day (BID) | ORAL | 0 refills | Status: AC

## 2018-10-17 MED ORDER — ONDANSETRON HCL 4 MG PO TABS: 4.0000 mg | ORAL_TABLET | Freq: Three times a day (TID) | ORAL | 0 refills | Status: DC | PRN

## 2018-10-17 NOTE — Progress Notes (Signed)
Has c/o vaginal itching, odor, discharge x 1 week.  Also recently had UTI. Was treated with Keflex. Doesn't think it cleared up symptoms. Still having burning, urgency, scanty urination, back pain

## 2018-10-17 NOTE — Progress Notes (Signed)
Established Patient Office Visit  Subjective:  Patient ID: Sherri [name], female    DOB: [birth year]  Age: 33 y.o. MRN: [id number]  CC:  Chief Complaint  Patient presents with  . Vaginitis    HPI Sherri [name], 33 year old African-American female, who presents secondary to recent emergency department visit on 10/11/2018 due to onset of nausea and vomiting after taking a dose of Macrobid for treatment of urinary tract infection.  Patient was given treatment for her nausea and vomiting and placed on a different antibiotic, Keflex for treatment of urinary tract infection.  Patient feels as if her urinary tract infection has not resolved she still has some generalized abdominal pain as well as burning with urination, frequent urination and also now with vaginal itching for few days as well as a whitish-yellow vaginal discharge.  Patient also with complaint of mid back pain and fatigue.        Patient has also had 3 days of right-sided ear pain and patient with an enlarged, tender lymph node on the right side of the neck below the ear.  Patient has been taken over-the-counter Advil for relief of ear pain.  She denies nasal congestion or sore throat.  She denies any fever or chills.  No shortness of breath or cough.         Patient also reports that she continues to feel nauseous but has had no further vomiting since her emergency department visit.  Patient feels as if her entire abdomen is uncomfortable.  She denies any blood in the stool and no dark stools.  Past Medical History:  Diagnosis Date  . Fibromyalgia    denies today  . Genital herpes   . Hypertension    gestational  . 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 Urinary tract infection   . Uterine fibroid     Past Surgical History:  Procedure Laterality Date  . CESAREAN SECTION N/A 06/05/2014   Procedure: CESAREAN SECTION;  Surgeon: Truett Mainland, DO;  Location: [facility];  Service: Obstetrics;  Laterality:  N/A;  . WISDOM TOOTH EXTRACTION      Family History  Problem Relation Age of Onset  . Hypertension Mother   . Hypertension Father   . Breast cancer Maternal Grandmother        32  . Colon cancer Neg Hx   . Colon polyps Neg Hx   . Kidney disease Neg Hx   . Diabetes Neg Hx   . Esophageal cancer Neg Hx   . Gallbladder disease Neg Hx   . Heart disease Neg Hx   . Asthma Neg Hx   . Cancer Neg Hx   . Stroke Neg Hx    Social History   Tobacco Use  . Smoking status: Never Smoker  . Smokeless tobacco: Never Used  Substance Use Topics  . Alcohol use: No  . Drug use: No     Outpatient Medications Prior to Visit  Medication Sig Dispense Refill  . esomeprazole (NEXIUM) 40 MG capsule Take 1 capsule (40 mg total) by mouth daily. To reduce stomach acid 30 capsule 5  . cephALEXin (KEFLEX) 500 MG capsule Take 1 capsule (500 mg total) by mouth 2 (two) times daily. 14 capsule 0  . ondansetron (ZOFRAN ODT) 4 MG disintegrating tablet Take 1 tablet (4 mg total) by mouth every 8 (eight) hours as needed for nausea or vomiting. 10 tablet 0   No facility-administered medications prior to visit.  Allergies  Allergen Reactions  . Macrobid [Nitrofurantoin] Nausea And Vomiting  Macrobid added to patient's allergy list based on information from her 10/11/2018 emergency department visit  ROS Review of Systems  Constitutional: Positive for fatigue. Negative for chills and fever.  HENT: Positive for ear pain. Negative for nosebleeds, postnasal drip, rhinorrhea, sore throat and trouble swallowing.   Respiratory: Negative for cough and shortness of breath.   Cardiovascular: Negative for chest pain, palpitations and leg swelling.  Gastrointestinal: Positive for abdominal pain and nausea. Negative for blood in stool, constipation, diarrhea and vomiting.  Endocrine: Positive for polyuria. Negative for polydipsia and polyphagia.  Genitourinary: Positive for dysuria, flank pain, frequency and vaginal  discharge. Negative for hematuria, pelvic pain, vaginal bleeding and vaginal pain.  Musculoskeletal: Positive for back pain. Negative for arthralgias.  Skin: Negative for rash and wound.  Neurological: Negative for dizziness and headaches.  Hematological: Positive for adenopathy. Does not bruise/bleed easily.      Objective:    Physical Exam  Constitutional: She is oriented to person, place, and time. She appears well-developed and well-nourished. No distress.  Well-nourished well-developed overweight for height female in no acute distress but patient appears slightly fatigued  HENT:  Right Ear: Hearing and ear canal normal. Tympanic membrane is erythematous.  Left Ear: Hearing, external ear and ear canal normal. Tympanic membrane is erythematous.  Patient with erythema and thickening of the right TM without visible landmarks.  Patient with slight pinkness of the left TM, landmarks visible  Neck: Normal range of motion. Neck supple.  Patient with large, quarter sized area of right anterior cervical chain lymphadenopathy which is tender to palpation  Cardiovascular: Normal rate and regular rhythm.  Pulmonary/Chest: Effort normal and breath sounds normal.  Abdominal: Soft. She exhibits distension. There is abdominal tenderness. There is no rebound and no guarding.  Patient with complaint of generalized abdominal discomfort with palpation which was worse in the suprapubic area  Musculoskeletal:        General: Tenderness present. No edema.     Comments: Patient with right CVA tenderness on exam  Lymphadenopathy:    She has cervical adenopathy.  Neurological: She is alert and oriented to person, place, and time.  Skin: Skin is warm and dry.  Psychiatric: She has a normal mood and affect. Her behavior is normal.  Nursing note and vitals reviewed.   BP 130/85   Pulse 98   Temp (!) 97.5 F (36.4 C) (Temporal)   Resp 17   Ht 4\' 11"  (1.499 m)   Wt 195 lb 3.2 oz (88.5 kg)   LMP  09/28/2018 (Approximate)   SpO2 97%   BMI 39.43 kg/m  Wt Readings from Last 3 Encounters:  10/17/18 195 lb 3.2 oz (88.5 kg)  10/11/18 178 lb (80.7 kg)  08/22/18 193 lb 3.2 oz (87.6 kg)     Health Maintenance Due  Topic Date Due  . PAP SMEAR-Modifier  07/17/2017  . INFLUENZA VACCINE  10/01/2018    There are no preventive care reminders to display for this patient.  Lab Results  Component Value Date   TSH 1.180 04/06/2018   Lab Results  Component Value Date   WBC 9.2 10/11/2018   HGB 12.5 10/11/2018   HCT 39.9 10/11/2018   MCV 88.1 10/11/2018   PLT 298 10/11/2018   Lab Results  Component Value Date   NA 137 10/11/2018   K 3.6 10/11/2018   CO2 22 10/11/2018   GLUCOSE 111 (H) 10/11/2018   BUN  13 10/11/2018   CREATININE 0.76 10/11/2018   BILITOT 0.3 05/12/2017   ALKPHOS 76 05/12/2017   AST 20 05/12/2017   ALT 14 05/12/2017   PROT 7.9 05/12/2017   ALBUMIN 4.3 05/12/2017   CALCIUM 9.5 10/11/2018   ANIONGAP 11 10/11/2018   Lab Results  Component Value Date   CHOL 198 01/07/2017   Lab Results  Component Value Date   HDL 56 01/07/2017   Lab Results  Component Value Date   LDLCALC 128 (H) 01/07/2017   Lab Results  Component Value Date   TRIG 57 01/07/2017   Lab Results  Component Value Date   CHOLHDL 3.5 01/07/2017   Lab Results  Component Value Date   HGBA1C 5.1 01/06/2018      Assessment & Plan:  1. Dysuria; 2. Cystitis; 6.  Encounter for examination following treatment at hospital; 7.  Generalized abdominal pain Patient is status post emergency department visit on 10/11/2018 with complaint of onset of nausea and vomiting after her first dose of Macrobid for treatment of urinary tract infection.  Patient had urinalysis done in the emergency department which still showed moderate leukocytes and patient was placed on Keflex for treatment of cystitis.  Patient however does not feel that her symptoms ever cleared up with the use of this medication.   Patient also now with complaint of continued urinary frequency, burning with urination as well as itching in the vaginal area and a whitish-yellow vaginal discharge.  Patient continues to have complaint of abdominal pain and fatigue.  Patient had repeat urinalysis at today's visit which showed small leukocytes and trace intact blood.  Patient also with generalized abdominal discomfort on examination which is worse in the suprapubic area.  Patient however also has otitis media on exam therefore will be placed on Augmentin 500 mg twice daily x7 days.  Urine will be sent for culture and patient will be notified of the changes needed in her antibiotic therapy based on the culture results.  Patient also will be treated for possible vaginal yeast infection.  Patient is to follow-up in 1 week or sooner if worsening of any symptoms. - POCT URINALYSIS DIP (CLINITEK) - Urine Culture - POCT URINALYSIS DIP (CLINITEK)  2. Vaginal itching  Patient with complaint of vaginal itching and vaginal discharge status post treatment of urinary tract infection with Keflex.  Patient did self swabbing for cervicovaginal ancillary testing and she will be notified of the results and appropriate treatment in the interim, patient is being prescribed Diflucan as well as nystatin for presumed monilial vaginal infection status post antibiotic therapy. - Cervicovaginal ancillary only - nystatin cream (MYCOSTATIN); Apply 1 application topically 3 (three) times daily.  Dispense: 30 g; Refill: 0 - fluconazole (DIFLUCAN) 100 MG tablet; Take 1 tablet (100 mg total) by mouth daily for 5 days.  Dispense: 5 tablet; Refill: 0  3. Right otitis media, unspecified otitis media type Patient with right otitis media on examination and right anterior cervical chain lymphadenopathy.  Prescription provided for Augmentin twice daily x7 days which patient should take after eating.  She can continue the use of over-the-counter pain medication as needed for  ear pain. - amoxicillin-clavulanate (AUGMENTIN) 500-125 MG tablet; Take 1 tablet (500 mg total) by mouth 2 (two) times daily for 7 days. Take after eating  Dispense: 14 tablet; Refill: 0  4. Nausea She complains of continued nausea.  Patient also have known acid reflux and should continue PPI therapy.  Prescription is being sent to patient's pharmacy  for Zofran 4 mg to take 1 every 8 hours as needed for nausea or vomiting.  Urine will also be sent for culture in case nausea secondary to continued urinary tract infection.  She will be notified if a change is needed in her antibiotic therapy based on the culture results. - Urine Culture - ondansetron (ZOFRAN) 4 MG tablet; Take 1 tablet (4 mg total) by mouth every 8 (eight) hours as needed for nausea or vomiting.  Dispense: 20 tablet; Refill: 0  5. Cystitis; 7.  Generalized abdominal pain Patient with abnormal urinalysis and urine will be sent for culture.  She will be notified if a change in antibiotic therapy is needed based on culture results.  Prescription for Augmentin 500 mg twice daily x7 days that she additionally has otitis media - Urine Culture - amoxicillin-clavulanate (AUGMENTIN) 500-125 MG tablet; Take 1 tablet (500 mg total) by mouth 2 (two) times daily for 7 days. Take after eating  Dispense: 14 tablet; Refill: 0  An After Visit Summary was printed and given to the patient.  Follow-up: Return in about 1 week (around 10/24/2018) for UTI/ear infection.   Antony Blackbird, MD

## 2018-10-17 NOTE — Patient Instructions (Signed)
Otitis Media, Adult  Otitis media means that the middle ear is red and swollen (inflamed) and full of fluid. The condition usually goes away on its own. Follow these instructions at home:  Take over-the-counter and prescription medicines only as told by your doctor.  If you were prescribed an antibiotic medicine, take it as told by your doctor. Do not stop taking the antibiotic even if you start to feel better.  Keep all follow-up visits as told by your doctor. This is important. Contact a doctor if:  You have bleeding from your nose.  There is a lump on your neck.  You are not getting better in 5 days.  You feel worse instead of better. Get help right away if:  You have pain that is not helped with medicine.  You have swelling, redness, or pain around your ear.  You get a stiff neck.  You cannot move part of your face (paralyzed).  You notice that the bone behind your ear hurts when you touch it.  You get a very bad headache. Summary  Otitis media means that the middle ear is red, swollen, and full of fluid.  This condition usually goes away on its own. In some cases, treatment may be needed.  If you were prescribed an antibiotic medicine, take it as told by your doctor. This information is not intended to replace advice given to you by your health care provider. Make sure you discuss any questions you have with your health care provider. Document Released: 08/05/2007 Document Revised: 01/29/2017 Document Reviewed: 03/09/2016 Elsevier Patient Education  2020 [employer]  

## 2018-10-18 LAB — URINE CULTURE: Organism ID, Bacteria: NO GROWTH

## 2018-10-19 ENCOUNTER — Other Ambulatory Visit: Payer: Self-pay | Admitting: Family Medicine

## 2018-10-19 DIAGNOSIS — B9689 Other specified bacterial agents as the cause of diseases classified elsewhere: Secondary | ICD-10-CM

## 2018-10-19 DIAGNOSIS — A5901 Trichomonal vulvovaginitis: Secondary | ICD-10-CM

## 2018-10-19 LAB — CERVICOVAGINAL ANCILLARY ONLY
Bacterial vaginitis: POSITIVE — AB
Candida vaginitis: NEGATIVE
Chlamydia: NEGATIVE
Neisseria Gonorrhea: NEGATIVE
Trichomonas: POSITIVE — AB

## 2018-10-19 MED ORDER — METRONIDAZOLE 500 MG PO TABS: 500.0000 mg | ORAL_TABLET | Freq: Two times a day (BID) | ORAL | 0 refills | Status: DC

## 2018-10-19 NOTE — Progress Notes (Signed)
Patient ID: Sherri [name], female   DOB: 1985/05/01, 33 y.o.   MRN: [id number]   Patient with trichomonas and bacterial vaginitis on cervicovaginal ancillary and will be notified that metronidazole has been sent into her pharmacy and that her partner will require treatment as well

## 2018-10-20 MED FILL — metroNIDAZOLE 500 MG TABS: 500 | 7 days supply | Qty: 14 | Fill #0

## 2018-10-23 ENCOUNTER — Encounter (HOSPITAL_COMMUNITY): Payer: Self-pay

## 2018-10-23 ENCOUNTER — Other Ambulatory Visit: Payer: Self-pay

## 2018-10-23 ENCOUNTER — Emergency Department (HOSPITAL_COMMUNITY)
Admission: EM | Admit: 2018-10-23 | Discharge: 2018-10-23 | Disposition: A | Discharge status: Home / Self Care | Payer: Self-pay | Attending: Emergency Medicine | Admitting: Emergency Medicine

## 2018-10-23 DIAGNOSIS — R590 Localized enlarged lymph nodes: Secondary | ICD-10-CM | POA: Insufficient documentation

## 2018-10-23 DIAGNOSIS — Z79899 Other long term (current) drug therapy: Secondary | ICD-10-CM | POA: Insufficient documentation

## 2018-10-23 DIAGNOSIS — I10 Essential (primary) hypertension: Secondary | ICD-10-CM | POA: Insufficient documentation

## 2018-10-23 NOTE — ED Triage Notes (Signed)
Lump on right side of jaw for about 2 weeks taking ibuprofen without relief no fever noted.

## 2018-10-23 NOTE — Discharge Instructions (Signed)
Recommend calling the ear nose and throat office tomorrow to schedule a close follow-up appointment regarding the ED symptoms you are having today.  Recommend warm compresses, Motrin and Tylenol as needed for pain.  If you develop worsening swelling, any redness, fever, difficulty swallowing, neck stiffness, please return to the ER for reassessment.  If the ENT office is not able to evaluate you within the next few days, please call your primary doctor for close recheck in the meantime.

## 2018-10-23 NOTE — ED Provider Notes (Signed)
[facility] DEPT Provider Note   CSN: [id number] Arrival date & time: 10/23/18  1221     History   Chief Complaint Chief Complaint  Patient presents with  . lump on neck    HPI Sherri [name] is a 33 y.o. female.  Presents emergency department with chief complaint right lump, swelling just blood draw.  Patient states that she has noticed a spot for about 2weeks and a has slowly increased in size.  States that she has been trying warm compresses occasionally and has taken some ibuprofen.  States it feels uncomfortable but denies any significant pain from this.  She has not seen any skin color changes, has not had any fevers.  She just completed a course of antibiotics for a right ear infection, Augmentin.  States she finished the prescription yesterday.  She denies any chronic medical conditions.  Tooth pain, tooth abscess, difficulty swallowing, neck stiffness, neck pain.     HPI  Past Medical History:  Diagnosis Date  . Fibromyalgia    denies today  . Genital herpes   . Hypertension    gestational  . 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 Urinary tract infection   . Uterine fibroid     Patient Active Problem List   Diagnosis Date Noted  . Hypertension   . Genital herpes   . Yeast infection 12/03/2016  . Status post cesarean delivery 06/06/2014  . Uterine fibroid 12/25/2013  . Obesity 12/25/2013    Past Surgical History:  Procedure Laterality Date  . CESAREAN SECTION N/A 06/05/2014   Procedure: CESAREAN SECTION;  Surgeon: Truett Mainland, DO;  Location: [facility];  Service: Obstetrics;  Laterality: N/A;  . WISDOM TOOTH EXTRACTION       OB History    Gravida  1   Para  1   Term      Preterm  1   AB      Living  2     SAB      TAB      Ectopic      Multiple  1   Live Births  2            Home Medications    Prior to Admission medications   Medication Sig Start Date End Date Taking?  Authorizing Provider  amoxicillin-clavulanate (AUGMENTIN) 500-125 MG tablet Take 1 tablet (500 mg total) by mouth 2 (two) times daily for 7 days. Take after eating 10/17/18 10/24/18  Fulp, Cammie, MD  esomeprazole (NEXIUM) 40 MG capsule Take 1 capsule (40 mg total) by mouth daily. To reduce stomach acid 03/07/18   Fulp, Cammie, MD  metroNIDAZOLE (FLAGYL) 500 MG tablet Take 1 tablet (500 mg total) by mouth 2 (two) times daily. 10/19/18   Fulp, Cammie, MD  nystatin cream (MYCOSTATIN) Apply 1 application topically 3 (three) times daily. 10/17/18   Fulp, Cammie, MD  ondansetron (ZOFRAN) 4 MG tablet Take 1 tablet (4 mg total) by mouth every 8 (eight) hours as needed for nausea or vomiting. 10/17/18   Antony Blackbird, MD    Family History Family History  Problem Relation Age of Onset  . Hypertension Mother   . Hypertension Father   . Breast cancer Maternal Grandmother        70  . Colon cancer Neg Hx   . Colon polyps Neg Hx   . Kidney disease Neg Hx   . Diabetes Neg Hx   . Esophageal cancer Neg Hx   .  Gallbladder disease Neg Hx   . Heart disease Neg Hx   . Asthma Neg Hx   . Cancer Neg Hx   . Stroke Neg Hx     Social History Social History   Tobacco Use  . Smoking status: Never Smoker  . Smokeless tobacco: Never Used  Substance Use Topics  . Alcohol use: No  . Drug use: No     Allergies   Macrobid [nitrofurantoin]   Review of Systems Review of Systems  Constitutional: Negative for chills and fever.  HENT: Negative for ear pain and sore throat.   Eyes: Negative for pain and visual disturbance.  Respiratory: Negative for cough and shortness of breath.   Cardiovascular: Negative for chest pain and palpitations.  Gastrointestinal: Negative for abdominal pain and vomiting.  Genitourinary: Negative for dysuria and hematuria.  Musculoskeletal: Negative for arthralgias and back pain.  Skin: Negative for color change and rash.  Neurological: Negative for seizures and syncope.  All other  systems reviewed and are negative.    Physical Exam Updated Vital Signs BP (!) 155/95   Pulse 90   Temp 98.7 F (37.1 C) (Oral)   Resp 16   Ht 5' (1.524 m)   Wt 88.5 kg   LMP 09/28/2018 (Approximate)   SpO2 95%   BMI 38.08 kg/m   Physical Exam Vitals signs and nursing note reviewed.  Constitutional:      General: She is not in acute distress.    Appearance: She is well-developed.  HENT:     Head: Normocephalic and atraumatic.  Eyes:     Conjunctiva/sclera: Conjunctivae normal.  Neck:     Musculoskeletal: Neck supple.     Comments: On right neck ~1 cm diameter lymph node, no fluctuance, no overlying erythema, mildly tender to palpation On left neck, no cervical lymphadenopathy appreciated Cardiovascular:     Rate and Rhythm: Normal rate and regular rhythm.     Heart sounds: No murmur.  Pulmonary:     Effort: Pulmonary effort is normal. No respiratory distress.     Breath sounds: Normal breath sounds.  Abdominal:     Palpations: Abdomen is soft.     Tenderness: There is no abdominal tenderness.  Musculoskeletal:        General: No swelling or tenderness.  Skin:    General: Skin is warm and dry.  Neurological:     General: No focal deficit present.     Mental Status: She is alert and oriented to person, place, and time.      ED Treatments / Results  Labs (all labs ordered are listed, but only abnormal results are displayed) Labs Reviewed - No data to display  EKG None  Radiology No results found.  Procedures Procedures (including critical care time)  Medications Ordered in ED Medications - No data to display   Initial Impression / Assessment and Plan / ED Course  I have reviewed the triage vital signs and the nursing notes.  Pertinent labs & imaging results that were available during my care of the patient were reviewed by me and considered in my medical decision making (see chart for details).       33 year old presented with lump on right  neck.  On exam concern for enlarged lymph node.  I suspect this is most likely a reactive lymph node.  Recent otitis media, recently finished antibiotic course.  No induration, erythema, lower suspicion for new infectious process or abscess.  Also consider cyst.  Given patient well-appearing, no  neck stiffness, difficulty swallowing, difficulty breathing, believe appropriate for further observation, outpatient follow-up.  Recommended follow-up with ENT this week.  If unable to see ENTrecommended follow-up with PCP for close recheck.  Reviewed strict return precautions in detail with patient.  After the discussed management above, the patient was determined to be safe for discharge.  The patient was in agreement with this plan and all questions regarding their care were answered.  ED return precautions were discussed and the patient will return to the ED with any significant worsening of condition.   Final Clinical Impressions(s) / ED Diagnoses   Final diagnoses:  Lymphadenopathy of right cervical region    ED Discharge Orders    None       Lucrezia Starch, MD 10/23/18 1334

## 2018-10-25 ENCOUNTER — Ambulatory Visit (HOSPITAL_COMMUNITY)
Admission: EM | Admit: 2018-10-25 | Discharge: 2018-10-25 | Disposition: A | Discharge status: Home / Self Care | Payer: Self-pay | Attending: Urgent Care | Admitting: Urgent Care

## 2018-10-25 ENCOUNTER — Encounter (HOSPITAL_COMMUNITY): Payer: Self-pay | Admitting: Emergency Medicine

## 2018-10-25 ENCOUNTER — Telehealth (HOSPITAL_COMMUNITY): Payer: Self-pay | Admitting: Emergency Medicine

## 2018-10-25 DIAGNOSIS — N760 Acute vaginitis: Secondary | ICD-10-CM | POA: Insufficient documentation

## 2018-10-25 DIAGNOSIS — A599 Trichomoniasis, unspecified: Secondary | ICD-10-CM | POA: Insufficient documentation

## 2018-10-25 DIAGNOSIS — B9689 Other specified bacterial agents as the cause of diseases classified elsewhere: Secondary | ICD-10-CM | POA: Insufficient documentation

## 2018-10-25 MED ORDER — TINIDAZOLE 500 MG PO TABS: 2.0000 g | ORAL_TABLET | Freq: Every day | ORAL | 0 refills | Status: AC

## 2018-10-25 NOTE — ED Provider Notes (Signed)
MRN: [id number] DOB: 05-24-1985  Subjective:   Sherri [name] is a 33 y.o. female presenting for recheck for trichomoniasis.  Patient tested positive on 10/17/2018 for bacterial vaginosis and BV.  Was started on metronidazole to cover for this.  She is concerned that she will not clear the infection since she did have sex with the same sex partner prior to completing her treatment.  She does admit that he is also getting treated for trichomoniasis but has not completed his treatment as well.  States that she still has same symptoms of vaginal irritation include burning.  Has a history of high blood pressure, is on any medications for this currently.  Urine pregnancy test on 10/10/2018 was negative.  She also had negative urine culture from 10/17/2018.  No current facility-administered medications for this encounter.   Current Outpatient Medications:  .  esomeprazole (NEXIUM) 40 MG capsule, Take 1 capsule (40 mg total) by mouth daily. To reduce stomach acid, Disp: 30 capsule, Rfl: 5 .  metroNIDAZOLE (FLAGYL) 500 MG tablet, Take 1 tablet (500 mg total) by mouth 2 (two) times daily., Disp: 14 tablet, Rfl: 0 .  nystatin cream (MYCOSTATIN), Apply 1 application topically 3 (three) times daily., Disp: 30 g, Rfl: 0 .  ondansetron (ZOFRAN) 4 MG tablet, Take 1 tablet (4 mg total) by mouth every 8 (eight) hours as needed for nausea or vomiting., Disp: 20 tablet, Rfl: 0    Allergies  Allergen Reactions  . Macrobid [Nitrofurantoin] Nausea And Vomiting    Past Medical History:  Diagnosis Date  . Fibromyalgia    denies today  . Genital herpes   . Hypertension    gestational  . 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 Urinary tract infection   . Uterine fibroid      Past Surgical History:  Procedure Laterality Date  . CESAREAN SECTION N/A 06/05/2014   Procedure: CESAREAN SECTION;  Surgeon: Truett Mainland, DO;  Location: [facility];  Service: Obstetrics;  Laterality: N/A;  .  WISDOM TOOTH EXTRACTION      ROS  Objective:   Vitals: BP (!) 160/84   Pulse 78   Temp 98.1 F (36.7 C)   Resp 18   LMP 09/21/2018   SpO2 100%   Physical Exam Constitutional:      General: She is not in acute distress.    Appearance: Normal appearance. She is well-developed and normal weight. She is not ill-appearing, toxic-appearing or diaphoretic.  HENT:     Head: Normocephalic and atraumatic.     Right Ear: External ear normal.     Left Ear: External ear normal.     Nose: Nose normal.     Mouth/Throat:     Mouth: Mucous membranes are moist.     Pharynx: Oropharynx is clear.  Eyes:     General: No scleral icterus.    Extraocular Movements: Extraocular movements intact.     Pupils: Pupils are equal, round, and reactive to light.  Cardiovascular:     Rate and Rhythm: Normal rate and regular rhythm.     Heart sounds: Normal heart sounds. No murmur. No friction rub. No gallop.   Pulmonary:     Effort: Pulmonary effort is normal. No respiratory distress.     Breath sounds: Normal breath sounds. No stridor. No wheezing, rhonchi or rales.  Abdominal:     General: Bowel sounds are normal. There is no distension.     Palpations: Abdomen is soft. There is no mass.  Tenderness: There is no abdominal tenderness. There is no right CVA tenderness, left CVA tenderness, guarding or rebound.  Skin:    General: Skin is warm and dry.     Coloration: Skin is not pale.     Findings: No rash.  Neurological:     General: No focal deficit present.     Mental Status: She is alert and oriented to person, place, and time.  Psychiatric:        Mood and Affect: Mood normal.        Behavior: Behavior normal.        Thought Content: Thought content normal.        Judgment: Judgment normal.      Assessment and Plan :   1. Bacterial vaginosis   2. Trichomoniasis     Patient is very worried and would like to change antibiotics.  Counseled that we could switch to tinidazole based  off of results but patient insists on having medication as well.  Labs pending. Counseled patient on potential for adverse effects with medications prescribed/recommended today, ER and return-to-clinic precautions discussed, patient verbalized understanding.    Jaynee Eagles, PA-C 10/25/18 1124

## 2018-10-25 NOTE — Telephone Encounter (Signed)
Late entry - 1:00 pm.  [facility] pharmacy called about expense of medication choice for patient.  $70 + and patient cannot afford this.  [place], [place] changed order to Metrogel, placed in vagina, once a day for 5 days.  Again, pharmacy reports this is $ 40.97 and patient says she is unable to pay this.    [place], [place] recommended they get results back before prescribing medication.  Pharmacy personel to inform patient.

## 2018-10-25 NOTE — ED Triage Notes (Signed)
Pt states she was treated for trich on 8/17 but had sexual intercourse prior to finishing her meds, concerned not fully treated for trich.

## 2018-10-27 LAB — CERVICOVAGINAL ANCILLARY ONLY
Bacterial vaginitis: NEGATIVE
Candida vaginitis: POSITIVE — AB
Chlamydia: NEGATIVE
Neisseria Gonorrhea: NEGATIVE
Trichomonas: POSITIVE — AB

## 2018-10-28 ENCOUNTER — Telehealth (HOSPITAL_COMMUNITY): Payer: Self-pay | Admitting: Emergency Medicine

## 2018-10-28 MED ORDER — METRONIDAZOLE 500 MG PO TABS: 2000.0000 mg | ORAL_TABLET | Freq: Once | ORAL | 0 refills | Status: AC

## 2018-10-28 MED ORDER — FLUCONAZOLE 150 MG PO TABS: 150.0000 mg | ORAL_TABLET | Freq: Once | ORAL | 0 refills | Status: AC

## 2018-10-28 NOTE — Telephone Encounter (Signed)
Trichomonas is positive. Rx  for Flagyl 2 grams, once was sent to the pharmacy of record. Pt needs education to refrain from sexual intercourse for 7 days to give the medicine time to work. Sexual partners need to be notified and tested/treated. Condoms may reduce risk of reinfection. Recheck for further evaluation if symptoms are not improving.   Test for candida (yeast) was positive.  Prescription for fluconazole 150mg  po now, repeat dose in 3d if needed, #2 no refills, sent to the pharmacy of record.  Recheck or followup with PCP for further evaluation if symptoms are not improving.    Patient contacted and made aware of    results, all questions answered

## 2018-11-03 ENCOUNTER — Other Ambulatory Visit: Payer: Self-pay

## 2018-11-03 ENCOUNTER — Encounter (HOSPITAL_COMMUNITY): Payer: Self-pay

## 2018-11-03 ENCOUNTER — Emergency Department (HOSPITAL_COMMUNITY)
Admission: EM | Admit: 2018-11-03 | Discharge: 2018-11-03 | Disposition: A | Discharge status: Home / Self Care | Payer: Self-pay | Attending: Emergency Medicine | Admitting: Emergency Medicine

## 2018-11-03 DIAGNOSIS — R300 Dysuria: Secondary | ICD-10-CM | POA: Insufficient documentation

## 2018-11-03 DIAGNOSIS — Z5321 Procedure and treatment not carried out due to patient leaving prior to being seen by health care provider: Secondary | ICD-10-CM | POA: Insufficient documentation

## 2018-11-03 LAB — URINALYSIS, ROUTINE W REFLEX MICROSCOPIC
Bilirubin Urine: NEGATIVE
Glucose, UA: NEGATIVE mg/dL
Hgb urine dipstick: NEGATIVE
Ketones, ur: NEGATIVE mg/dL
Leukocytes,Ua: NEGATIVE
Nitrite: NEGATIVE
Protein, ur: NEGATIVE mg/dL
Specific Gravity, Urine: 1.02 (ref 1.005–1.030)
pH: 6 (ref 5.0–8.0)

## 2018-11-03 NOTE — ED Triage Notes (Signed)
Patient reports dysuria x 2-3 weeks. Patient was seen at an UC on 10/25/18 and was given antibiotics. Patient states she continues to have burning and pressure.

## 2018-11-09 ENCOUNTER — Other Ambulatory Visit: Payer: Self-pay

## 2018-11-09 ENCOUNTER — Encounter (HOSPITAL_COMMUNITY): Payer: Self-pay | Admitting: Emergency Medicine

## 2018-11-09 ENCOUNTER — Emergency Department (HOSPITAL_COMMUNITY)
Admission: EM | Admit: 2018-11-09 | Discharge: 2018-11-09 | Discharge status: Against Medical Advice | Payer: Self-pay | Attending: Emergency Medicine | Admitting: Emergency Medicine

## 2018-11-09 DIAGNOSIS — Z5321 Procedure and treatment not carried out due to patient leaving prior to being seen by health care provider: Secondary | ICD-10-CM | POA: Insufficient documentation

## 2018-11-09 DIAGNOSIS — R109 Unspecified abdominal pain: Secondary | ICD-10-CM | POA: Insufficient documentation

## 2018-11-09 NOTE — Progress Notes (Signed)
Patient ID: Sherri [name], female   DOB: Sep 07, 1985, 33 y.o.   MRN: [id number]   Sherri [name], is a 33 y.o. female  [id number]  [id number]  DOB - [birth year]  Subjective:  Chief Complaint and HPI: Sherri [name] is a 33 y.o. female here today  for a follow up visit Recently treated for trich twice. tested + on 8/17 and 8/25 and treated again.  Seen at [facility] and ED.  Today patient presents with vaginal itching.   Her partner has now been treated for trich.  No fever.  Some scant vaginal discharge.  LMP 10/23/2018.  No N/V.  No HIV/RPR testing recently.    ROS:   Constitutional:  No f/c, No night sweats, No unexplained weight loss. EENT:  No vision changes, No blurry vision, No hearing changes. No mouth, throat, or ear problems.  Respiratory: No cough, No SOB Cardiac: No CP, no palpitations GI:  No abd pain, No N/V/D. GU: No Urinary s/sx Musculoskeletal: No joint pain Neuro: No headache, no dizziness, no motor weakness.  Skin: No rash Endocrine:  No polydipsia. No polyuria.  Psych: Denies SI/HI  No problems updated.  ALLERGIES: Allergies  Allergen Reactions  . Macrobid [Nitrofurantoin] Nausea And Vomiting    PAST MEDICAL HISTORY: Past Medical History:  Diagnosis Date  . Fibromyalgia    denies today  . Genital herpes   . Hypertension    gestational  . 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 Urinary tract infection   . Uterine fibroid     MEDICATIONS AT HOME: Prior to Admission medications   Medication Sig Start Date End Date Taking? Authorizing Provider  esomeprazole (NEXIUM) 40 MG capsule Take 1 capsule (40 mg total) by mouth daily. To reduce stomach acid 03/07/18  Yes Fulp, Cammie, MD  nystatin cream (MYCOSTATIN) Apply 1 application topically 3 (three) times daily. 10/17/18  Yes Fulp, Cammie, MD  ondansetron (ZOFRAN) 4 MG tablet Take 1 tablet (4 mg total) by mouth every 8 (eight) hours as needed for nausea or vomiting. 10/17/18  Yes Fulp,  Cammie, MD  fluconazole (DIFLUCAN) 150 MG tablet Take 1 tablet (150 mg total) by mouth once for 1 dose. 11/10/18 11/10/18  Argentina Donovan, PA-C     Objective:  EXAM:   Vitals:   11/10/18 1106  BP: 122/85  Pulse: 74  Temp: 98.2 F (36.8 C)  TempSrc: Oral  SpO2: 97%  Weight: 196 lb (88.9 kg)  Height: 5' (1.524 m)    General appearance : A&OX3. NAD. Non-toxic-appearing HEENT: Atraumatic and Normocephalic.  PERRLA. EOM intact.   Chest/Lungs:  Breathing-non-labored, Good air entry bilaterally, breath sounds normal without rales, rhonchi, or wheezing  CVS: S1 S2 regular, no murmurs, gallops, rubs  Pelvic/GU:  External genitalia WNL, speculum inserted, vaginal mucosa appears WNL, scant whitish discharge, cervix WNL and swabbed.  Bimanual unremarkable.   Extremities: Bilateral Lower Ext shows no edema, both legs are warm to touch with = pulse throughout Neurology:  CN II-XII grossly intact, Non focal.   Psych:  TP linear. J/I WNL. Normal speech. Appropriate eye contact and affect.  Skin:  No Rash  Data Review Lab Results  Component Value Date   HGBA1C 5.1 01/06/2018     Assessment & Plan   1. H/O trichomoniasis - POCT URINALYSIS DIP (CLINITEK) - Cervicovaginal ancillary only - HIV antibody (with reflex) - RPR -condoms always!!  2. Vaginal itching -will cover for yeast due to recent antibiotics - fluconazole (DIFLUCAN) 150 MG tablet;  Take 1 tablet (150 mg total) by mouth once for 1 dose.  Dispense: 1 tablet; Refill: 0  3. Encounter for examination following treatment at hospital   Patient have been counseled extensively about nutrition and exercise  Return in about 6 months (around 05/10/2019) for pcp.  The patient was given clear instructions to go to ER or return to medical center if symptoms don't improve, worsen or new problems develop. The patient verbalized understanding. The patient was told to call to get lab results if they haven't heard anything in the next  week.     Freeman Caldron, PA-C [facility] [place], [place]   11/10/2018, 11:26 AM

## 2018-11-09 NOTE — ED Notes (Signed)
I called patient name in the lobby and no one responded

## 2018-11-09 NOTE — ED Notes (Signed)
No answer for blood draw.

## 2018-11-09 NOTE — ED Notes (Signed)
Urine click off in error.  I did not collect it.

## 2018-11-09 NOTE — ED Triage Notes (Signed)
Patient c/o vaginal itching, white vaginal discharge, and pain with urination x1 week. Hx trich.

## 2018-11-10 ENCOUNTER — Ambulatory Visit: Payer: Self-pay | Attending: Family Medicine | Admitting: Physician Assistant

## 2018-11-10 VITALS — BP 122/85 | HR 74 | Temp 98.2°F | Ht 60.0 in | Wt 196.0 lb

## 2018-11-10 DIAGNOSIS — N898 Other specified noninflammatory disorders of vagina: Secondary | ICD-10-CM

## 2018-11-10 DIAGNOSIS — Z09 Encounter for follow-up examination after completed treatment for conditions other than malignant neoplasm: Secondary | ICD-10-CM

## 2018-11-10 DIAGNOSIS — Z8619 Personal history of other infectious and parasitic diseases: Secondary | ICD-10-CM

## 2018-11-10 LAB — POCT URINALYSIS DIP (CLINITEK)
Bilirubin, UA: NEGATIVE
Blood, UA: NEGATIVE
Glucose, UA: NEGATIVE mg/dL
Ketones, POC UA: NEGATIVE mg/dL
Leukocytes, UA: NEGATIVE
Nitrite, UA: NEGATIVE
POC PROTEIN,UA: NEGATIVE
Spec Grav, UA: >=1.03 — AB (ref 1.010–1.025)
Urobilinogen, UA: 0.2 E.U./dL
pH, UA: 6 (ref 5.0–8.0)

## 2018-11-10 MED ORDER — FLUCONAZOLE 150 MG PO TABS: 150.0000 mg | ORAL_TABLET | Freq: Once | ORAL | 0 refills | Status: AC

## 2018-11-10 MED FILL — FLUCONAZOLE 150 MG TABS: 150 | 1 days supply | Qty: 1 | Fill #0

## 2018-11-11 LAB — RPR: RPR Ser Ql: NONREACTIVE

## 2018-11-11 LAB — HIV ANTIBODY (ROUTINE TESTING W REFLEX): HIV Screen 4th Generation wRfx: NONREACTIVE

## 2018-11-12 LAB — CERVICOVAGINAL ANCILLARY ONLY
Bacterial vaginitis: POSITIVE — AB
Candida vaginitis: POSITIVE — AB
Chlamydia: NEGATIVE
Neisseria Gonorrhea: NEGATIVE
Trichomonas: NEGATIVE

## 2018-11-14 ENCOUNTER — Other Ambulatory Visit: Payer: Self-pay | Admitting: Physician Assistant

## 2018-11-14 MED ORDER — METRONIDAZOLE 500 MG PO TABS: 500.0000 mg | ORAL_TABLET | Freq: Two times a day (BID) | ORAL | 0 refills | Status: DC

## 2018-11-14 MED ORDER — FLUCONAZOLE 150 MG PO TABS: 150.0000 mg | ORAL_TABLET | Freq: Once | ORAL | 0 refills | Status: AC

## 2018-11-17 ENCOUNTER — Encounter (HOSPITAL_COMMUNITY): Payer: Self-pay

## 2018-11-17 ENCOUNTER — Other Ambulatory Visit: Payer: Self-pay

## 2018-11-17 ENCOUNTER — Emergency Department (HOSPITAL_COMMUNITY)
Admission: EM | Admit: 2018-11-17 | Discharge: 2018-11-17 | Disposition: A | Discharge status: Home / Self Care | Payer: Self-pay | Attending: Emergency Medicine | Admitting: Emergency Medicine

## 2018-11-17 DIAGNOSIS — R300 Dysuria: Secondary | ICD-10-CM | POA: Insufficient documentation

## 2018-11-17 DIAGNOSIS — N898 Other specified noninflammatory disorders of vagina: Secondary | ICD-10-CM | POA: Insufficient documentation

## 2018-11-17 DIAGNOSIS — R350 Frequency of micturition: Secondary | ICD-10-CM | POA: Insufficient documentation

## 2018-11-17 DIAGNOSIS — Z5321 Procedure and treatment not carried out due to patient leaving prior to being seen by health care provider: Secondary | ICD-10-CM | POA: Insufficient documentation

## 2018-11-17 LAB — URINALYSIS, ROUTINE W REFLEX MICROSCOPIC
Bacteria, UA: NONE SEEN
Bilirubin Urine: NEGATIVE
Glucose, UA: NEGATIVE mg/dL
Ketones, ur: NEGATIVE mg/dL
Nitrite: NEGATIVE
Protein, ur: NEGATIVE mg/dL
Specific Gravity, Urine: 1.028 (ref 1.005–1.030)
pH: 5 (ref 5.0–8.0)

## 2018-11-17 NOTE — ED Notes (Signed)
Pt called for room, no response from lobby 

## 2018-11-17 NOTE — ED Triage Notes (Signed)
Patient c/o  Dysuria, frequent urination, vaginal discharge (cottage cheese). Patient states she was recently treated for trich.

## 2018-11-21 ENCOUNTER — Other Ambulatory Visit: Payer: Self-pay | Admitting: Emergency Medicine

## 2018-11-21 ENCOUNTER — Ambulatory Visit (HOSPITAL_COMMUNITY)
Admission: EM | Admit: 2018-11-21 | Discharge: 2018-11-21 | Disposition: A | Discharge status: Home / Self Care | Payer: Self-pay | Attending: Emergency Medicine | Admitting: Emergency Medicine

## 2018-11-21 ENCOUNTER — Other Ambulatory Visit: Payer: Self-pay

## 2018-11-21 ENCOUNTER — Encounter (HOSPITAL_COMMUNITY): Payer: Self-pay | Admitting: Emergency Medicine

## 2018-11-21 DIAGNOSIS — Z113 Encounter for screening for infections with a predominantly sexual mode of transmission: Secondary | ICD-10-CM

## 2018-11-21 DIAGNOSIS — Z202 Contact with and (suspected) exposure to infections with a predominantly sexual mode of transmission: Secondary | ICD-10-CM | POA: Insufficient documentation

## 2018-11-21 DIAGNOSIS — N898 Other specified noninflammatory disorders of vagina: Secondary | ICD-10-CM | POA: Insufficient documentation

## 2018-11-21 DIAGNOSIS — R030 Elevated blood-pressure reading, without diagnosis of hypertension: Secondary | ICD-10-CM | POA: Insufficient documentation

## 2018-11-21 LAB — POCT URINALYSIS DIP (DEVICE)
Bilirubin Urine: NEGATIVE
Glucose, UA: NEGATIVE mg/dL
Hgb urine dipstick: NEGATIVE
Ketones, ur: NEGATIVE mg/dL
Leukocytes,Ua: NEGATIVE
Nitrite: NEGATIVE
Protein, ur: NEGATIVE mg/dL
Specific Gravity, Urine: 1.025 (ref 1.005–1.030)
Urobilinogen, UA: 0.2 mg/dL (ref 0.0–1.0)
pH: 6.5 (ref 5.0–8.0)

## 2018-11-21 NOTE — ED Triage Notes (Signed)
Pt here for STD screening and vaginal discharge

## 2018-11-21 NOTE — Discharge Instructions (Addendum)
Your urine does not show signs of an infection.  Your STD tests are pending.  If your test results are positive, we will call you.  You may need treatment and your partner may also need treatment.    Do not have sex until your test results are back.    Your blood pressure is elevated today at 141/95.  Please have this rechecked by your primary care provider in 2 weeks.

## 2018-11-21 NOTE — ED Provider Notes (Signed)
[place]    CSN: [id number] Arrival date & time: 11/21/18  1046      History   Chief Complaint Chief Complaint  Patient presents with   Vaginal Discharge   Exposure to STD    HPI Sherri [name] is a 33 y.o. female.   Patient presents with request for STD screening due to vaginal irritation and white vaginal discharge.  She is sexually active and does not use condoms.  She was positive for trichomonas and bacterial vaginosis on 10/17/2018; positive for Candida and trichomonas on 10/25/2018; positive for bacterial vaginosis and Candida on 11/10/2018.  She was treated each of these times and states she completed the treatment.  She denies fever, chills, abdominal pain, pelvic pain, dysuria, back pain.  LMP: 1 week.   The history is provided by the patient.    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    Urinary tract infection    Uterine fibroid     Patient Active Problem List   Diagnosis Date Noted   Hypertension    Genital herpes    Yeast infection 12/03/2016   Status post cesarean delivery 06/06/2014   Uterine fibroid 12/25/2013   Obesity 12/25/2013    Past Surgical History:  Procedure Laterality Date   CESAREAN SECTION N/A 06/05/2014   Procedure: CESAREAN SECTION;  Surgeon: Truett Mainland, DO;  Location: [facility];  Service: Obstetrics;  Laterality: N/A;   WISDOM TOOTH EXTRACTION      OB History    Gravida  1   Para  1   Term      Preterm  1   AB      Living  2     SAB      TAB      Ectopic      Multiple  1   Live Births  2            Home Medications    Prior to Admission medications   Medication Sig Start Date End Date Taking? Authorizing Provider  esomeprazole (NEXIUM) 40 MG capsule Take 1 capsule (40 mg total) by mouth daily. To reduce stomach acid 03/07/18   Fulp,  Cammie, MD  metroNIDAZOLE (FLAGYL) 500 MG tablet Take 1 tablet (500 mg total) by mouth 2 (two) times daily. Patient not taking: Reported on 11/21/2018 11/14/18   Argentina Donovan, PA-C  nystatin cream (MYCOSTATIN) Apply 1 application topically 3 (three) times daily. 10/17/18   Fulp, Cammie, MD  ondansetron (ZOFRAN) 4 MG tablet Take 1 tablet (4 mg total) by mouth every 8 (eight) hours as needed for nausea or vomiting. 10/17/18   Fulp, Cammie, MD    Family History Family History  Problem Relation Age of Onset   Hypertension Mother    Hypertension Father    Breast cancer Maternal Grandmother        65   Colon cancer Neg Hx    Colon polyps Neg Hx    Kidney disease Neg Hx    Diabetes Neg Hx    Esophageal cancer Neg Hx    Gallbladder disease Neg Hx    Heart disease Neg Hx    Asthma Neg Hx    Cancer Neg Hx    Stroke Neg Hx     Social History Social History   Tobacco Use  Smoking status: Never Smoker   Smokeless tobacco: Never Used  Substance Use Topics   Alcohol use: No   Drug use: No     Allergies   Macrobid [nitrofurantoin]   Review of Systems Review of Systems  Constitutional: Negative for chills and fever.  HENT: Negative for ear pain and sore throat.   Eyes: Negative for pain and visual disturbance.  Respiratory: Negative for cough and shortness of breath.   Cardiovascular: Negative for chest pain and palpitations.  Gastrointestinal: Negative for abdominal pain and vomiting.  Genitourinary: Positive for vaginal discharge. Negative for dysuria, flank pain, hematuria and pelvic pain.  Musculoskeletal: Negative for arthralgias and back pain.  Skin: Negative for color change and rash.  Neurological: Negative for seizures and syncope.  All other systems reviewed and are negative.    Physical Exam Triage Vital Signs ED Triage Vitals  Enc Vitals Group     BP 11/21/18 1137 (!) 141/95     Pulse Rate 11/21/18 1137 81     Resp 11/21/18 1137 18      Temp 11/21/18 1137 98.7 F (37.1 C)     Temp Source 11/21/18 1137 Oral     SpO2 11/21/18 1137 99 %     Weight --      Height --      Head Circumference --      Peak Flow --      Pain Score 11/21/18 1138 6     Pain Loc --      Pain Edu? --      Excl. in [place]? --    No data found.  Updated Vital Signs BP (!) 141/95 (BP Location: Right Arm)    Pulse 81    Temp 98.7 F (37.1 C) (Oral)    Resp 18    SpO2 99%   Visual Acuity Right Eye Distance:   Left Eye Distance:   Bilateral Distance:    Right Eye Near:   Left Eye Near:    Bilateral Near:     Physical Exam Vitals signs and nursing note reviewed.  Constitutional:      General: She is not in acute distress.    Appearance: She is well-developed.  HENT:     Head: Normocephalic and atraumatic.     Mouth/Throat:     Mouth: Mucous membranes are moist.     Pharynx: Oropharynx is clear.  Eyes:     Conjunctiva/sclera: Conjunctivae normal.  Neck:     Musculoskeletal: Neck supple.  Cardiovascular:     Rate and Rhythm: Normal rate and regular rhythm.     Heart sounds: No murmur.  Pulmonary:     Effort: Pulmonary effort is normal. No respiratory distress.     Breath sounds: Normal breath sounds.  Abdominal:     General: Bowel sounds are normal.     Palpations: Abdomen is soft.     Tenderness: There is no abdominal tenderness. There is no right CVA tenderness, left CVA tenderness, guarding or rebound.  Genitourinary:    General: Normal vulva.     Labia:        Right: No rash, tenderness or lesion.        Left: No rash, tenderness or lesion.      Vagina: Vaginal discharge present.     Cervix: Normal.     Uterus: Normal.      Adnexa: Right adnexa normal and left adnexa normal.     Comments: Small amount of white vaginal discharge. Skin:  General: Skin is warm and dry.  Neurological:     Mental Status: She is alert.      UC Treatments / Results  Labs (all labs ordered are listed, but only abnormal results are  displayed) Labs Reviewed  POCT URINALYSIS DIP (DEVICE)  CERVICOVAGINAL ANCILLARY ONLY    EKG   Radiology No results found.  Procedures Procedures (including critical care time)  Medications Ordered in UC Medications - No data to display  Initial Impression / Assessment and Plan / UC Course  I have reviewed the triage vital signs and the nursing notes.  Pertinent labs & imaging results that were available during my care of the patient were reviewed by me and considered in my medical decision making (see chart for details).     Vaginal discharge, potential exposure to STD.  Elevated blood pressure without known hypertension.  Urine unremarkable.  Swab for STDs obtained.  No treatment today as patient has been treated multiple times over the past month.  Discussed with patient that we will call her if her STD test results are positive requiring treatment and that she and her partners may need treatment at that time.  Discussed that she should refrain from sex until the test results are back.  Also discussed with patient that her blood pressure is elevated today and should be checked by her PCP in the next 2 to 4 weeks.  Patient agrees to plan of care.     Final Clinical Impressions(s) / UC Diagnoses   Final diagnoses:  Vaginal discharge  Potential exposure to STD  Elevated blood pressure reading without diagnosis of hypertension     Discharge Instructions     Your urine does not show signs of an infection.  Your STD tests are pending.  If your test results are positive, we will call you.  You may need treatment and your partner may also need treatment.    Do not have sex until your test results are back.    Your blood pressure is elevated today at 141/95.  Please have this rechecked by your primary care provider in 2 weeks.      ED Prescriptions    None     PDMP not reviewed this encounter.   Sharion Balloon, NP 11/21/18 1236

## 2018-11-22 LAB — CERVICOVAGINAL ANCILLARY ONLY
Bacterial Vaginitis (gardnerella): POSITIVE — AB
Candida Glabrata: POSITIVE — AB
Candida Vaginitis: NEGATIVE
Molecular Disclaimer: NEGATIVE
Molecular Disclaimer: NEGATIVE
Molecular Disclaimer: NEGATIVE
Molecular Disclaimer: NORMAL
Trichomonas: NEGATIVE

## 2018-11-22 MED FILL — ESOMEPRAZOLE MAG DR 40 MG C: 40 | 30 days supply | Qty: 30 | Fill #1

## 2018-11-23 LAB — CERVICOVAGINAL ANCILLARY ONLY
Chlamydia: NEGATIVE
Neisseria Gonorrhea: NEGATIVE

## 2018-11-24 ENCOUNTER — Telehealth (HOSPITAL_COMMUNITY): Payer: Self-pay | Admitting: Emergency Medicine

## 2018-11-24 NOTE — Telephone Encounter (Signed)
Pt contacted and made aware of plan of care per [facility], pt has been here multiple times and finished the flagyl, diflucan, was immediately retested and tested positive again. Pt has had this issue several times in past. Per traci, pt needs to follow up with PCp or womens health doctor. Pt given [facility], told to follow up with new PCP, and also given info on womens health clinics. Pt agreeable to plan, all questions answered.

## 2018-11-29 ENCOUNTER — Ambulatory Visit: Payer: Self-pay

## 2018-12-02 ENCOUNTER — Encounter (HOSPITAL_COMMUNITY): Payer: Self-pay | Admitting: Emergency Medicine

## 2018-12-02 ENCOUNTER — Other Ambulatory Visit: Payer: Self-pay

## 2018-12-02 ENCOUNTER — Emergency Department (HOSPITAL_COMMUNITY)
Admission: EM | Admit: 2018-12-02 | Discharge: 2018-12-02 | Disposition: A | Discharge status: Home / Self Care | Payer: Self-pay | Attending: Emergency Medicine | Admitting: Emergency Medicine

## 2018-12-02 ENCOUNTER — Emergency Department (HOSPITAL_COMMUNITY): Admission: EM | Admit: 2018-12-02 | Discharge: 2018-12-02 | Discharge status: Against Medical Advice | Payer: Self-pay

## 2018-12-02 DIAGNOSIS — R1084 Generalized abdominal pain: Secondary | ICD-10-CM | POA: Insufficient documentation

## 2018-12-02 DIAGNOSIS — R300 Dysuria: Secondary | ICD-10-CM | POA: Insufficient documentation

## 2018-12-02 DIAGNOSIS — I10 Essential (primary) hypertension: Secondary | ICD-10-CM | POA: Insufficient documentation

## 2018-12-02 LAB — COMPREHENSIVE METABOLIC PANEL
ALT: 16 U/L (ref 0–44)
AST: 17 U/L (ref 15–41)
Albumin: 4 g/dL (ref 3.5–5.0)
Alkaline Phosphatase: 78 U/L (ref 38–126)
Anion gap: 6 (ref 5–15)
BUN: 7 mg/dL (ref 6–20)
CO2: 23 mmol/L (ref 22–32)
Calcium: 9.1 mg/dL (ref 8.9–10.3)
Chloride: 110 mmol/L (ref 98–111)
Creatinine, Ser: 0.77 mg/dL (ref 0.44–1.00)
GFR calc Af Amer: >60 mL/min (ref >60)
GFR calc non Af Amer: >60 mL/min (ref >60)
Glucose, Bld: 94 mg/dL (ref 70–99)
Potassium: 3.7 mmol/L (ref 3.5–5.1)
Sodium: 139 mmol/L (ref 135–145)
Total Bilirubin: 0.6 mg/dL (ref 0.3–1.2)
Total Protein: 7.3 g/dL (ref 6.5–8.1)

## 2018-12-02 LAB — URINALYSIS, ROUTINE W REFLEX MICROSCOPIC
Bilirubin Urine: NEGATIVE
Glucose, UA: NEGATIVE mg/dL
Ketones, ur: NEGATIVE mg/dL
Leukocytes,Ua: NEGATIVE
Nitrite: NEGATIVE
Protein, ur: NEGATIVE mg/dL
Specific Gravity, Urine: 1.013 (ref 1.005–1.030)
pH: 5 (ref 5.0–8.0)

## 2018-12-02 LAB — WET PREP, GENITAL
Clue Cells Wet Prep HPF POC: NONE SEEN
Sperm: NONE SEEN
Trich, Wet Prep: NONE SEEN
WBC, Wet Prep HPF POC: NONE SEEN
Yeast Wet Prep HPF POC: NONE SEEN

## 2018-12-02 LAB — I-STAT BETA HCG BLOOD, ED (MC, WL, AP ONLY): I-stat hCG, quantitative: <5 m[IU]/mL (ref <5)

## 2018-12-02 LAB — CBC
HCT: 36.8 % (ref 36.0–46.0)
Hemoglobin: 11.3 g/dL — ABNORMAL LOW (ref 12.0–15.0)
MCH: 26.4 pg (ref 26.0–34.0)
MCHC: 30.7 g/dL (ref 30.0–36.0)
MCV: 86 fL (ref 80.0–100.0)
Platelets: 272 10*3/uL (ref 150–400)
RBC: 4.28 MIL/uL (ref 3.87–5.11)
RDW: 15.6 % — ABNORMAL HIGH (ref 11.5–15.5)
WBC: 5.8 10*3/uL (ref 4.0–10.5)
nRBC: 0 % (ref 0.0–0.2)

## 2018-12-02 LAB — LIPASE, BLOOD: Lipase: 31 U/L (ref 11–51)

## 2018-12-02 MED ORDER — ALUM & MAG HYDROXIDE-SIMETH 200-200-20 MG/5ML PO SUSP
30.0000 mL | Freq: Once | ORAL | Status: AC
  Administered 2018-12-02: 30 mL via ORAL
  Filled 2018-12-02: qty 30

## 2018-12-02 MED ORDER — CEFTRIAXONE SODIUM 250 MG IJ SOLR
250.0000 mg | Freq: Once | INTRAMUSCULAR | Status: AC
  Administered 2018-12-02: 250 mg via INTRAMUSCULAR
  Filled 2018-12-02: qty 250

## 2018-12-02 MED ORDER — LIDOCAINE HCL 1 % IJ SOLN
INTRAMUSCULAR | Status: AC
  Filled 2018-12-02: qty 20

## 2018-12-02 MED ORDER — LIDOCAINE VISCOUS HCL 2 % MT SOLN
15.0000 mL | Freq: Once | OROMUCOSAL | Status: AC
  Administered 2018-12-02: 15 mL via ORAL
  Filled 2018-12-02: qty 15

## 2018-12-02 MED ORDER — AZITHROMYCIN 1 G PO PACK
1.0000 g | PACK | Freq: Once | ORAL | Status: AC
  Administered 2018-12-02: 1 g via ORAL
  Filled 2018-12-02: qty 1

## 2018-12-02 NOTE — ED Provider Notes (Signed)
[place] DEPT Provider Note   CSN: [id number] Arrival date & time: 12/02/18  [id number]     History   Chief Complaint Chief Complaint  Patient presents with  . Abdominal Pain  . Dysuria    HPI CHARLANN BUITRAGO is a 33 y.o. female with past medical history significant for uterine fibroids, s/p cesarian section, and recent diagnoses of trichomonas, BV, and Candida who presents to the ED for a one-week history of generalized abdominal pain and dysuria.  Patient was evaluated 2 weeks ago for vaginal discharge which was negative for chlamydia or gonorrhea, but once again positive for BV in candidal infection.  Given that this is becoming a recurrent issue, patient was provided follow-up instructions to follow-up with her new PCP as well as resource information for women's health clinics and patient voiced understanding and agreement to pursue those resources.   Today, patient complains of generalized 7 out of 10 abdominal pain as well as discharge and dysuria.  Patient also complains of some mild chest "burning", diminished appetite, and nausea but no vomiting. No obvious aggravating or alleviating factors.  Patient has taken ibuprofen and ginger ale with no relief.  She states that her last bowel movement was 2 to 3 days ago and was described as hard and dry.  She denies any fevers, chills, lightheadedness, back pain, chest pain, shortness of breath.  She reports taking her prescriptions for previously diagnosed BV and candidal infection.     HPI  Past Medical History:  Diagnosis Date  . Fibromyalgia    denies today  . Genital herpes   . Hypertension    gestational  . 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 Urinary tract infection   . Uterine fibroid     Patient Active Problem List   Diagnosis Date Noted  . Hypertension   . Genital herpes   . Yeast infection 12/03/2016  . Status post cesarean delivery 06/06/2014  . Uterine  fibroid 12/25/2013  . Obesity 12/25/2013    Past Surgical History:  Procedure Laterality Date  . CESAREAN SECTION N/A 06/05/2014   Procedure: CESAREAN SECTION;  Surgeon: Truett Mainland, DO;  Location: [facility];  Service: Obstetrics;  Laterality: N/A;  . WISDOM TOOTH EXTRACTION       OB History    Gravida  1   Para  1   Term      Preterm  1   AB      Living  2     SAB      TAB      Ectopic      Multiple  1   Live Births  2            Home Medications    Prior to Admission medications   Medication Sig Start Date End Date Taking? Authorizing Provider  nystatin cream (MYCOSTATIN) Apply 1 application topically 3 (three) times daily. 10/17/18  Yes Fulp, Cammie, MD  ondansetron (ZOFRAN) 4 MG tablet Take 1 tablet (4 mg total) by mouth every 8 (eight) hours as needed for nausea or vomiting. 10/17/18  Yes Fulp, Cammie, MD  esomeprazole (NEXIUM) 40 MG capsule Take 1 capsule (40 mg total) by mouth daily. To reduce stomach acid Patient not taking: Reported on 12/02/2018 03/07/18   Fulp, Cammie, MD  metroNIDAZOLE (FLAGYL) 500 MG tablet Take 1 tablet (500 mg total) by mouth 2 (two) times daily. Patient not taking: Reported on 11/21/2018 11/14/18   Freeman Caldron  M, PA-C    Family History Family History  Problem Relation Age of Onset  . Hypertension Mother   . Hypertension Father   . Breast cancer Maternal Grandmother        89  . Colon cancer Neg Hx   . Colon polyps Neg Hx   . Kidney disease Neg Hx   . Diabetes Neg Hx   . Esophageal cancer Neg Hx   . Gallbladder disease Neg Hx   . Heart disease Neg Hx   . Asthma Neg Hx   . Cancer Neg Hx   . Stroke Neg Hx     Social History Social History   Tobacco Use  . Smoking status: Never Smoker  . Smokeless tobacco: Never Used  Substance Use Topics  . Alcohol use: No  . Drug use: No     Allergies   Macrobid [nitrofurantoin]   Review of Systems Review of Systems  All other systems reviewed and are negative.     Physical Exam Updated Vital Signs BP (!) 141/91 (BP Location: Left Arm)   Pulse 85   Temp 98.4 F (36.9 C) (Oral)   Resp 16   LMP 11/25/2018   SpO2 100%   Physical Exam Vitals signs and nursing note reviewed. Exam conducted with a chaperone present.  Constitutional:      Appearance: Normal appearance.  HENT:     Head: Normocephalic and atraumatic.  Eyes:     General: No scleral icterus.    Conjunctiva/sclera: Conjunctivae normal.  Cardiovascular:     Rate and Rhythm: Normal rate and regular rhythm.     Heart sounds: Normal heart sounds.  Pulmonary:     Effort: Pulmonary effort is normal.     Breath sounds: Normal breath sounds.  Abdominal:     Tenderness: There is no right CVA tenderness or left CVA tenderness.     Comments: Abdomen is soft, nondistended.  Tender to palpation diffusely, but no guarding.  No masses appreciated.  Bowel sounds normoactive.  Genitourinary:    Comments: White, thick discharge noted in vaginal vault.  Cervix appeared non-erythematous, no discolored discharge from cervical os.  Left adnexal tenderness to palpation on bimanual exam, but no cervical motion tenderness or masses appreciated. Skin:    General: Skin is dry.  Neurological:     Mental Status: She is alert.     GCS: GCS eye subscore is 4. GCS verbal subscore is 5. GCS motor subscore is 6.  Psychiatric:        Mood and Affect: Mood normal.        Behavior: Behavior normal.        Thought Content: Thought content normal.      ED Treatments / Results  Labs (all labs ordered are listed, but only abnormal results are displayed) Labs Reviewed  URINALYSIS, ROUTINE W REFLEX MICROSCOPIC - Abnormal; Notable for the following components:      Result Value   Hgb urine dipstick SMALL (*)    Bacteria, UA RARE (*)    All other components within normal limits  CBC - Abnormal; Notable for the following components:   Hemoglobin 11.3 (*)    RDW 15.6 (*)    All other components within normal  limits  WET PREP, GENITAL  COMPREHENSIVE METABOLIC PANEL  LIPASE, BLOOD  I-STAT BETA HCG BLOOD, ED ([facility], [facility], [facility] ONLY)  WET PREP  (BD AFFIRM) ([place])  GC/CHLAMYDIA PROBE AMP ([place]) NOT AT [facility]    EKG None  Radiology No results found.  Procedures Procedures (including critical care time)  Medications Ordered in ED Medications  cefTRIAXone (ROCEPHIN) injection 250 mg (has no administration in time range)  azithromycin (ZITHROMAX) powder 1 g (has no administration in time range)  alum & mag hydroxide-simeth (MAALOX/MYLANTA) 200-200-20 MG/5ML suspension 30 mL (30 mLs Oral Given 12/02/18 1100)    And  lidocaine (XYLOCAINE) 2 % viscous mouth solution 15 mL (15 mLs Oral Given 12/02/18 1100)     Initial Impression / Assessment and Plan / ED Course  I have reviewed the triage vital signs and the nursing notes.  Pertinent labs & imaging results that were available during my care of the patient were reviewed by me and considered in my medical decision making (see chart for details).        Provided GI cocktail for patient here in the ED given her report of generalized abdominal discomfort, mild chest "burning", and nausea.  Patient denies any significant improvement after receiving the Maalox.   Do not suspect an acute abdominal emergency such as appendicitis given patient appearing comfortable, normal vitals, and diffuse abdominal discomfort rather than localized. Not an ectopic pregnancy as urine pregnancy test was negative and abdominal discomfort diffuse. Do not suspect gallbladder-related etiology as no transaminitis or elevated alkaline phosphate. Negative Murphy's sign. Do not suspect ovarian torsion or other emergent pelvic disorder as discomfort x 5 days and not localized.  UA was clear, negative for nitrites or leukocytes/WBCs, and so do not suspect that patient has acute cystitis.   Performed speculum exam and obtained wet prep which demonstrated no yeast,  trichomonas, WBCs, or clue cells which is not particularly surprising as she was treated for with diflucan and flagyl within the past couple of weeks.  Also performed a bimanual exam which elicited some tenderness to palpation in the left adnexal region, but no cervical motion tenderness or masses appreciated on exam.   Given her symptoms of dysuria, discharge, and generalized abdominal discomfort in conjunction with her concern for STIs, I feel that it is appropriate to treat for G/C. When I propositioned this as an option, patient voiced that she would very much like to receive the treatment. Rocephin and azithromycin administered here in the ED. Also recommended that she take OTC stool softeners of Miralax to help with her hard and infrequent stools which might also explain her generalized abdominal discomfort.  Patient voiced understanding and is agreeable to plan.    Final Clinical Impressions(s) / ED Diagnoses   Final diagnoses:  Generalized abdominal pain    ED Discharge Orders    None       Corena Herter, PA-C 12/02/18 1331    Charlesetta Shanks, MD 12/05/18 0730

## 2018-12-02 NOTE — ED Provider Notes (Signed)
33 y/o female presenting with abd pain, dysuria, frequency, vaginal discharge for a few days.  Denies NV or fevers.  She is sexually active with one partner. She does not use protection. She is concerned for STDs and would like to be treated for such.   abd soft and minimally tender. No peritoneal signs.  Labs are reassuring, UA negative. tx with ceftriaxone and azithromycin. Advised to f/u and return if worse. Resources for f/u were given.    Rodney Booze, PA-C 12/02/18 1743    Charlesetta Shanks, MD 12/05/18 0730

## 2018-12-02 NOTE — Discharge Instructions (Addendum)
Recommend that you obtain stool softener or  You have been treated presumptively today for gonorrhea and chlamydia.   You have been tested today for gonorrhea and chlamydia. These results will be available in approximately 3 days. You may check your MyChart account for results. Please inform all sexual partners of positive results and that they should be tested and treated as well.  Please wait 2 weeks and be sure that you and your partners are symptom free before returning to sexual activity. Please use protection with every sexual encounter.  Follow Up: Please followup with your primary doctor in 3 days for discussion of your diagnoses and further evaluation after today's visit; if you do not have a primary care doctor use the resource guide provided to find one; Please return to the ER for worsening symptoms, high fevers or persistent vomiting.    Free HIV and STD Testing These locations offer FREE confidential testing for HIV, Chlamydia, Gonorrhea, and Syphilis. Non-Traditional Testing Sites Address Telephone  [place] [phone] Mondays [place] [address], [place] [phone] Wednesdays 2pm-8pm  [facility] [address] [phone] Thursdays 9am-12noon 1pm-4pm  [facility] [address] [place] [phone] Tuesdays Thursdays 9am-12noon 1pm-4pm  [facility] offers free, confidential testing and treatment for HIV, Chlamydia, Gonorrhea, Syphilis, Herpes, Bacterial Vaginosis, Yeast, and Trichomoniasis. [place] [facility] [address], [place]  Monday thru Friday  Call for an appointment  [facility] Department-  [facility] [address] [place] [phone] Monday thru Friday  Call for anappointment.  If you have any questions about this information please call [phone]. 01/08/2011

## 2018-12-02 NOTE — ED Notes (Signed)
Pt called for triage, no response from the lobby.

## 2018-12-02 NOTE — ED Triage Notes (Signed)
Per pt, states generalized abdominal pain and dysuria for about a week-

## 2018-12-05 LAB — GC/CHLAMYDIA PROBE AMP (~~LOC~~) NOT AT ARMC
Chlamydia: NEGATIVE
Neisseria Gonorrhea: NEGATIVE

## 2018-12-08 ENCOUNTER — Emergency Department (HOSPITAL_COMMUNITY): Admission: EM | Admit: 2018-12-08 | Discharge: 2018-12-08 | Discharge status: Against Medical Advice | Payer: Self-pay

## 2018-12-08 ENCOUNTER — Other Ambulatory Visit: Payer: Self-pay

## 2018-12-08 ENCOUNTER — Encounter (HOSPITAL_COMMUNITY): Payer: Self-pay

## 2018-12-08 ENCOUNTER — Ambulatory Visit (HOSPITAL_COMMUNITY)
Admission: EM | Admit: 2018-12-08 | Discharge: 2018-12-08 | Disposition: A | Discharge status: Home / Self Care | Payer: Self-pay | Attending: Nurse Practitioner | Admitting: Nurse Practitioner

## 2018-12-08 DIAGNOSIS — N761 Subacute and chronic vaginitis: Secondary | ICD-10-CM | POA: Insufficient documentation

## 2018-12-08 LAB — POCT URINALYSIS DIP (DEVICE)
Bilirubin Urine: NEGATIVE
Glucose, UA: NEGATIVE mg/dL
Ketones, ur: NEGATIVE mg/dL
Leukocytes,Ua: NEGATIVE
Nitrite: NEGATIVE
Protein, ur: NEGATIVE mg/dL
Specific Gravity, Urine: 1.02 (ref 1.005–1.030)
Urobilinogen, UA: 0.2 mg/dL (ref 0.0–1.0)
pH: 6 (ref 5.0–8.0)

## 2018-12-08 MED ORDER — SULFAMETHOXAZOLE-TRIMETHOPRIM 800-160 MG PO TABS: 1.0000 | ORAL_TABLET | Freq: Two times a day (BID) | ORAL | 0 refills | Status: AC

## 2018-12-08 NOTE — ED Provider Notes (Signed)
[facility]    CSN: [id number] Arrival date & time: 12/08/18  1117      History   Chief Complaint Chief Complaint  Patient presents with  . Exposure to STD    HPI Sherri [name] is a 33 y.o. female.   Subjective:   Sherri [name] is a 33 y.o. female who presents for evaluation of vaginal symptoms of burning, itching, painful perineum and painful intercourse. Symptoms have been present for 1 week. She denies any bumps, discharge, odor or dysuria. Menstrual pattern: bleeding regularly. She is currently on her menses. Contraception: none. She is sexually active with one female partner. STI Risk: Possible STD exposure   Notably, the patient was evaluated at [facility] on 10/2 for similar complaints. She was concerned for STIs and requested testing. She was treated empirically with Rocephin and Azithromycin. Her partner has been treated empirically as well. GC/Chalymidia and wet prep were all negative.   The following portions of the patient's history were reviewed and updated as appropriate: allergies, current medications, past family history, past medical history, past social history, past surgical history and problem list.        Past Medical History:  Diagnosis Date  . Fibromyalgia    denies today  . Genital herpes   . Hypertension    gestational  . 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 Urinary tract infection   . Uterine fibroid     Patient Active Problem List   Diagnosis Date Noted  . Hypertension   . Genital herpes   . Yeast infection 12/03/2016  . Status post cesarean delivery 06/06/2014  . Uterine fibroid 12/25/2013  . Obesity 12/25/2013    Past Surgical History:  Procedure Laterality Date  . CESAREAN SECTION N/A 06/05/2014   Procedure: CESAREAN SECTION;  Surgeon: Truett Mainland, DO;  Location: [facility];  Service: Obstetrics;  Laterality: N/A;  . WISDOM TOOTH EXTRACTION      OB History    Gravida  1   Para  1    Term      Preterm  1   AB      Living  2     SAB      TAB      Ectopic      Multiple  1   Live Births  2            Home Medications    Prior to Admission medications   Medication Sig Start Date End Date Taking? Authorizing Provider  esomeprazole (NEXIUM) 40 MG capsule Take 1 capsule (40 mg total) by mouth daily. To reduce stomach acid Patient not taking: Reported on 12/02/2018 03/07/18   Fulp, Cammie, MD  metroNIDAZOLE (FLAGYL) 500 MG tablet Take 1 tablet (500 mg total) by mouth 2 (two) times daily. Patient not taking: Reported on 11/21/2018 11/14/18   Argentina Donovan, PA-C  nystatin cream (MYCOSTATIN) Apply 1 application topically 3 (three) times daily. 10/17/18   Fulp, Cammie, MD  ondansetron (ZOFRAN) 4 MG tablet Take 1 tablet (4 mg total) by mouth every 8 (eight) hours as needed for nausea or vomiting. 10/17/18   Fulp, Cammie, MD  sulfamethoxazole-trimethoprim (BACTRIM DS) 800-160 MG tablet Take 1 tablet by mouth 2 (two) times daily for 3 days. 12/08/18 12/11/18  Enrique Sack, FNP    Family History Family History  Problem Relation Age of Onset  . Hypertension Mother   . Hypertension Father   . Breast cancer Maternal  Grandmother        92  . Colon cancer Neg Hx   . Colon polyps Neg Hx   . Kidney disease Neg Hx   . Diabetes Neg Hx   . Esophageal cancer Neg Hx   . Gallbladder disease Neg Hx   . Heart disease Neg Hx   . Asthma Neg Hx   . Cancer Neg Hx   . Stroke Neg Hx     Social History Social History   Tobacco Use  . Smoking status: Never Smoker  . Smokeless tobacco: Never Used  Substance Use Topics  . Alcohol use: No  . Drug use: No     Allergies   Macrobid [nitrofurantoin]   Review of Systems Review of Systems  Constitutional: Negative for fever.  Genitourinary: Positive for pelvic pain and vaginal pain. Negative for dysuria, flank pain, frequency, genital sores, menstrual problem, urgency and vaginal discharge.  Musculoskeletal:  Negative for back pain.  All other systems reviewed and are negative.    Physical Exam Triage Vital Signs ED Triage Vitals  Enc Vitals Group     BP 12/08/18 1141 138/81     Pulse Rate 12/08/18 1141 80     Resp 12/08/18 1141 18     Temp 12/08/18 1141 98.1 F (36.7 C)     Temp src --      SpO2 12/08/18 1141 100 %     Weight 12/08/18 1140 195 lb (88.5 kg)     Height --      Head Circumference --      Peak Flow --      Pain Score 12/08/18 1140 7     Pain Loc --      Pain Edu? --      Excl. in [place]? --    No data found.  Updated Vital Signs BP 138/81 (BP Location: Right Arm)   Pulse 80   Temp 98.1 F (36.7 C)   Resp 18   Wt 195 lb (88.5 kg)   LMP 11/25/2018   SpO2 100%   BMI 39.39 kg/m   Visual Acuity Right Eye Distance:   Left Eye Distance:   Bilateral Distance:    Right Eye Near:   Left Eye Near:    Bilateral Near:     Physical Exam Vitals signs reviewed.  Constitutional:      Appearance: Normal appearance.  Neck:     Musculoskeletal: Normal range of motion.  Cardiovascular:     Rate and Rhythm: Normal rate and regular rhythm.  Pulmonary:     Effort: Pulmonary effort is normal.     Breath sounds: Normal breath sounds.  Abdominal:     General: Bowel sounds are normal. There is no distension.     Palpations: Abdomen is soft.  Musculoskeletal: Normal range of motion.  Skin:    General: Skin is warm and dry.  Neurological:     General: No focal deficit present.     Mental Status: She is alert and oriented to person, place, and time.      UC Treatments / Results  Labs (all labs ordered are listed, but only abnormal results are displayed) Labs Reviewed  POCT URINALYSIS DIP (DEVICE) - Abnormal; Notable for the following components:      Result Value   Hgb urine dipstick MODERATE (*)    All other components within normal limits  URINE CULTURE  CERVICOVAGINAL ANCILLARY ONLY    EKG   Radiology No results found.  Procedures Procedures  (  including critical care time)  Medications Ordered in UC Medications - No data to display  Initial Impression / Assessment and Plan / UC Course  I have reviewed the triage vital signs and the nursing notes.  Pertinent labs & imaging results that were available during my care of the patient were reviewed by me and considered in my medical decision making (see chart for details).    33 y.o. female who presents for evaluation of vaginal burning, itching, painful perineum and painful intercourse. Symptoms have been ongoing for about a week. She was recently evaluated for the same 6 days ago. She and her partner was treated empirically with Rocephin and Azithromycin; however, GC/Chalymidia and wet prep were all negative. UA today unremarkable for any acute infection. Oral antibiotics. Urine cultures. Abstinence from intercourse until symptoms resolve/improve. Discussed safe sex practices. Follow up with GYN   Today's evaluation has revealed no signs of a dangerous process. Discussed diagnosis with patient and/or guardian. Patient and/or guardian aware of their diagnosis, possible red flag symptoms to watch out for and need for close follow up. Patient and/or guardian understands verbal and written discharge instructions. Patient and/or guardian comfortable with plan and disposition.  Patient and/or guardian has a clear mental status at this time, good insight into illness (after discussion and teaching) and has clear judgment to make decisions regarding their care  This care was provided during an unprecedented National Emergency due to the Novel Coronavirus (COVID-19) pandemic. COVID-19 infections and transmission risks place heavy strains on healthcare resources.  As this pandemic evolves, our facility, providers, and staff strive to respond fluidly, to remain operational, and to provide care relative to available resources and information. Outcomes are unpredictable and treatments are without  well-defined guidelines. Further, the impact of COVID-19 on all aspects of urgent care, including the impact to patients seeking care for reasons other than COVID-19, is unavoidable during this national emergency. At this time of the global pandemic, management of patients has significantly changed, even for non-COVID positive patients given high local and regional COVID volumes at this time requiring high healthcare system and resource utilization. The standard of care for management of both COVID suspected and non-COVID suspected patients continues to change rapidly at the local, regional, national, and global levels. This patient was worked up and treated to the best available but ever changing evidence and resources available at this current time.   Documentation was completed with the aid of voice recognition software. Transcription may contain typographical errors.  Final Clinical Impressions(s) / UC Diagnoses   Final diagnoses:  Subacute vaginitis     Discharge Instructions     I will not treat you for any STDs until we get the results of your test back since you've recently been treated. Your test results from your hospital visit on 10/2 were all negative. Your urine here today was also negative for any infection. I have given you three days of antibiotics to take for any possible bacteria infection that may have not been seen on the labs since you still have symptoms. We will send out your urine to check for any growth. Avoid sexual intercourse until symptoms resolve and/or improve. Drink plenty of fluids. You will only receive a call if any of your tests are positive. You can go online in a couple of days to view MyChart to see all your results. Please follow up with gynecology. Call as soon as possible to make an appointment.   Take care, Aldona Bar  ED Prescriptions    Medication Sig Dispense Auth. Provider   sulfamethoxazole-trimethoprim (BACTRIM DS) 800-160 MG tablet Take 1  tablet by mouth 2 (two) times daily for 3 days. 6 tablet Enrique Sack, FNP     PDMP not reviewed this encounter.   Enrique Sack, [place] 12/08/18 1320

## 2018-12-08 NOTE — Discharge Instructions (Addendum)
I will not treat you for any STDs until we get the results of your test back since you've recently been treated. Your test results from your hospital visit on 10/2 were all negative. Your urine here today was also negative for any infection. I have given you three days of antibiotics to take for any possible bacteria infection that may have not been seen on the labs since you still have symptoms. We will send out your urine to check for any growth. Avoid sexual intercourse until symptoms resolve and/or improve. Drink plenty of fluids. You will only receive a call if any of your tests are positive. You can go online in a couple of days to view MyChart to see all your results. Please follow up with gynecology. Call as soon as possible to make an appointment.   Take care, Aldona Bar

## 2018-12-08 NOTE — ED Triage Notes (Signed)
Pt states she thinks she has a STD. Pt states she has been to the hospital a week ago for this  Same issue. Pt states she's burning and pelvis pain.x 1 week

## 2018-12-09 LAB — URINE CULTURE
Culture: 30000 — AB
Special Requests: NORMAL

## 2018-12-11 ENCOUNTER — Emergency Department (HOSPITAL_COMMUNITY)
Admission: EM | Admit: 2018-12-11 | Discharge: 2018-12-11 | Disposition: A | Discharge status: Home / Self Care | Payer: Self-pay | Attending: Emergency Medicine | Admitting: Emergency Medicine

## 2018-12-11 ENCOUNTER — Emergency Department (HOSPITAL_COMMUNITY): Payer: Self-pay

## 2018-12-11 ENCOUNTER — Encounter (HOSPITAL_COMMUNITY): Payer: Self-pay | Admitting: Emergency Medicine

## 2018-12-11 ENCOUNTER — Other Ambulatory Visit: Payer: Self-pay

## 2018-12-11 DIAGNOSIS — I10 Essential (primary) hypertension: Secondary | ICD-10-CM | POA: Insufficient documentation

## 2018-12-11 DIAGNOSIS — N760 Acute vaginitis: Secondary | ICD-10-CM | POA: Insufficient documentation

## 2018-12-11 DIAGNOSIS — K5900 Constipation, unspecified: Secondary | ICD-10-CM | POA: Insufficient documentation

## 2018-12-11 DIAGNOSIS — Z79899 Other long term (current) drug therapy: Secondary | ICD-10-CM | POA: Insufficient documentation

## 2018-12-11 DIAGNOSIS — B9689 Other specified bacterial agents as the cause of diseases classified elsewhere: Secondary | ICD-10-CM | POA: Insufficient documentation

## 2018-12-11 LAB — POC URINE PREG, ED: Preg Test, Ur: NEGATIVE

## 2018-12-11 LAB — WET PREP, GENITAL
Sperm: NONE SEEN
Trich, Wet Prep: NONE SEEN
WBC, Wet Prep HPF POC: NONE SEEN
Yeast Wet Prep HPF POC: NONE SEEN

## 2018-12-11 LAB — URINALYSIS, ROUTINE W REFLEX MICROSCOPIC
Bacteria, UA: NONE SEEN
Bilirubin Urine: NEGATIVE
Glucose, UA: NEGATIVE mg/dL
Ketones, ur: NEGATIVE mg/dL
Leukocytes,Ua: NEGATIVE
Nitrite: NEGATIVE
Protein, ur: NEGATIVE mg/dL
Specific Gravity, Urine: 1.016 (ref 1.005–1.030)
pH: 5 (ref 5.0–8.0)

## 2018-12-11 MED ORDER — SODIUM CHLORIDE 0.9% FLUSH: 3.0000 mL | Freq: Once | INTRAVENOUS | Status: DC

## 2018-12-11 MED ORDER — MAGNESIUM CITRATE PO SOLN: 1.0000 | Freq: Once | ORAL | 1 refills | Status: AC

## 2018-12-11 MED ORDER — METRONIDAZOLE 500 MG PO TABS: 500.0000 mg | ORAL_TABLET | Freq: Two times a day (BID) | ORAL | 0 refills | Status: DC

## 2018-12-11 NOTE — ED Triage Notes (Signed)
Pt c/o lower abd pains and dysuria with vaginal itching x weeks. Still on cycle, so cant tell if has blood in urine.

## 2018-12-11 NOTE — Discharge Instructions (Signed)
Avoid any personal products. Wash with water only. Flagyl until all gone. Take mag citrate for constipation. Follow up with OB/GYN if not improving.

## 2018-12-11 NOTE — ED Provider Notes (Signed)
[facility] DEPT Provider Note   CSN: [id number] Arrival date & time: 12/11/18  [id number]     History   Chief Complaint Chief Complaint  Patient presents with  . Abdominal Pain  . Dysuria  . Vaginal Itching    HPI Sherri [name] is a 33 y.o. female.     HPI Sherri [name] is a 33 y.o. female presents to emergency department with complaint of lower abdominal cramping, left-sided flank pain, urinary frequency, dysuria, vaginal discharge.  Patient initially was seen 9 days ago for similar symptoms.  She was told that her urine, gonorrhea, chlamydia, all came back normal however she was empirically treated with Rocephin and azithromycin.  She was seen again 3 days ago at urgent care, at that time she was found to have normal exam and no concern for UTI or any GU abnormalities.  She was discharged home.  She states she continues to have symptoms.  She is also complaining of constipation, last bowel movement was 1 week ago.  She denies any fever or chills.  No nausea or vomiting.  She denies pregnancy.  She is on her menstrual cycle at this time.  She has not taken any other medications other than what she was given in emergency department during initial evaluation.  She does have history of STI and concerned that she may still have sexually transmitted infection.  Past Medical History:  Diagnosis Date  . Fibromyalgia    denies today  . Genital herpes   . Hypertension    gestational  . 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 Urinary tract infection   . Uterine fibroid     Patient Active Problem List   Diagnosis Date Noted  . Hypertension   . Genital herpes   . Yeast infection 12/03/2016  . Status post cesarean delivery 06/06/2014  . Uterine fibroid 12/25/2013  . Obesity 12/25/2013    Past Surgical History:  Procedure Laterality Date  . CESAREAN SECTION N/A 06/05/2014   Procedure: CESAREAN SECTION;  Surgeon: Truett Mainland, DO;  Location: [facility];  Service: Obstetrics;  Laterality: N/A;  . WISDOM TOOTH EXTRACTION       OB History    Gravida  1   Para  1   Term      Preterm  1   AB      Living  2     SAB      TAB      Ectopic      Multiple  1   Live Births  2            Home Medications    Prior to Admission medications   Medication Sig Start Date End Date Taking? Authorizing Provider  esomeprazole (NEXIUM) 40 MG capsule Take 1 capsule (40 mg total) by mouth daily. To reduce stomach acid 03/07/18  Yes Fulp, Cammie, MD  sulfamethoxazole-trimethoprim (BACTRIM DS) 800-160 MG tablet Take 1 tablet by mouth 2 (two) times daily for 3 days. 12/08/18 12/11/18 Yes Enrique Sack, FNP  metroNIDAZOLE (FLAGYL) 500 MG tablet Take 1 tablet (500 mg total) by mouth 2 (two) times daily. Patient not taking: Reported on 11/21/2018 11/14/18   Argentina Donovan, PA-C  nystatin cream (MYCOSTATIN) Apply 1 application topically 3 (three) times daily. Patient not taking: Reported on 12/11/2018 10/17/18   Fulp, Cammie, MD  ondansetron (ZOFRAN) 4 MG tablet Take 1 tablet (4 mg total) by mouth every 8 (eight) hours as  needed for nausea or vomiting. Patient not taking: Reported on 12/11/2018 10/17/18   Antony Blackbird, MD    Family History Family History  Problem Relation Age of Onset  . Hypertension Mother   . Hypertension Father   . Breast cancer Maternal Grandmother        24  . Colon cancer Neg Hx   . Colon polyps Neg Hx   . Kidney disease Neg Hx   . Diabetes Neg Hx   . Esophageal cancer Neg Hx   . Gallbladder disease Neg Hx   . Heart disease Neg Hx   . Asthma Neg Hx   . Cancer Neg Hx   . Stroke Neg Hx     Social History Social History   Tobacco Use  . Smoking status: Never Smoker  . Smokeless tobacco: Never Used  Substance Use Topics  . Alcohol use: No  . Drug use: No     Allergies   Macrobid [nitrofurantoin]   Review of Systems Review of Systems  Constitutional: Negative for  chills and fever.  Respiratory: Negative for cough, chest tightness and shortness of breath.   Cardiovascular: Negative for chest pain, palpitations and leg swelling.  Gastrointestinal: Positive for abdominal pain and constipation. Negative for diarrhea, nausea and vomiting.  Genitourinary: Positive for dysuria, flank pain, frequency, pelvic pain, vaginal discharge and vaginal pain. Negative for genital sores, hematuria and vaginal bleeding.  Musculoskeletal: Negative for arthralgias, myalgias, neck pain and neck stiffness.  Skin: Negative for rash.  Neurological: Negative for dizziness, weakness and headaches.  All other systems reviewed and are negative.    Physical Exam Updated Vital Signs BP (!) 141/76 (BP Location: Left Arm)   Pulse 88   Temp 99 F (37.2 C) (Oral)   Resp 18   LMP 11/25/2018   SpO2 96%   Physical Exam Vitals signs and nursing note reviewed.  Constitutional:      General: She is not in acute distress.    Appearance: She is well-developed.  HENT:     Head: Normocephalic.  Eyes:     Conjunctiva/sclera: Conjunctivae normal.  Neck:     Musculoskeletal: Neck supple.  Cardiovascular:     Rate and Rhythm: Normal rate and regular rhythm.     Heart sounds: Normal heart sounds.  Pulmonary:     Effort: Pulmonary effort is normal. No respiratory distress.     Breath sounds: Normal breath sounds. No wheezing or rales.  Abdominal:     General: Bowel sounds are normal. There is no distension.     Palpations: Abdomen is soft.     Tenderness: There is generalized abdominal tenderness. There is no rebound.  Skin:    General: Skin is warm and dry.  Neurological:     Mental Status: She is alert.  Psychiatric:        Behavior: Behavior normal.      ED Treatments / Results  Labs (all labs ordered are listed, but only abnormal results are displayed) Labs Reviewed  WET PREP, GENITAL - Abnormal; Notable for the following components:      Result Value   Clue Cells  Wet Prep HPF POC PRESENT (*)    All other components within normal limits  URINALYSIS, ROUTINE W REFLEX MICROSCOPIC - Abnormal; Notable for the following components:   Hgb urine dipstick MODERATE (*)    All other components within normal limits  POC URINE PREG, ED  GC/CHLAMYDIA PROBE AMP ([place]) NOT AT [facility]    EKG None  Radiology Dg Abdomen 1 View  Result Date: 12/11/2018 CLINICAL DATA:  abd pain in right lower quadrant x 1 week, N/V/ and constipation; pt reports hx of constipation. No previous abdominal surgery or medial issue EXAM: ABDOMEN - 1 VIEW COMPARISON:  Chest radiograph 07/23/2017 FINDINGS: Paucity of small gas limits evaluation for obstruction but there are no dilated loops present. Air and stool are seen to the level of the rectum. No evidence for free air on supine view. No unexpected radiopaque foreign body. Lung bases are excluded from field of view. No acute finding in the visualized skeleton. IMPRESSION: Paucity of small bowel gas limits evaluation for obstruction but there are no dilated loops present. Electronically Signed   By: Audie Pinto M.D.   On: 12/11/2018 12:45    Procedures Procedures (including critical care time)  Medications Ordered in ED Medications  sodium chloride flush (NS) 0.9 % injection 3 mL (has no administration in time range)     Initial Impression / Assessment and Plan / ED Course  I have reviewed the triage vital signs and the nursing notes.  Pertinent labs & imaging results that were available during my care of the patient were reviewed by me and considered in my medical decision making (see chart for details).        11:17 AM Patient seen and examined.  Patient with diffuse abdominal cramping, persistent vaginal discharge, dysuria and urinary frequency despite negative wet prep and GC chlamydia 1 week ago.  Patient's urine culture from 12/08/2018 grew 30,000 colonies of group B strep, this was not treated.  We will repeat  urine analysis and pelvic exam today.  I will also get abdominal x-ray to evaluate for constipation.  1:00 PM Wet prep showing clue cells. None on most recent one. Will treat again with flagyl. No WBCs. No concern for cervicitis or PID. No concern for ovarian torsion. Possible cyst vs fibroids? No concern for UTI. Will treat for BV and constipation. No need for emergent imaging today.  She will follow up with GYN.   Vitals:   12/11/18 0943 12/11/18 1206  BP: (!) 141/76 137/82  Pulse: 88 75  Resp: 18 15  Temp: 99 F (37.2 C)   TempSrc: Oral   SpO2: 96% 100%     Final Clinical Impressions(s) / ED Diagnoses   Final diagnoses:  BV (bacterial vaginosis)  Constipation, unspecified constipation type    ED Discharge Orders    None       Jeannett Senior, PA-C 12/11/18 1305    Lacretia Leigh, MD 12/11/18 1412

## 2018-12-12 ENCOUNTER — Telehealth (HOSPITAL_COMMUNITY): Payer: Self-pay | Admitting: Emergency Medicine

## 2018-12-12 LAB — CERVICOVAGINAL ANCILLARY ONLY
Bacterial vaginitis: POSITIVE — AB
Candida vaginitis: POSITIVE — AB
Chlamydia: NEGATIVE
Neisseria Gonorrhea: NEGATIVE
Trichomonas: NEGATIVE

## 2018-12-12 MED ORDER — FLUCONAZOLE 150 MG PO TABS: 150.0000 mg | ORAL_TABLET | Freq: Once | ORAL | 0 refills | Status: AC

## 2018-12-12 NOTE — Telephone Encounter (Signed)
Bacterial Vaginosis test is positive.  Prescription for metronidazole was given at the urgent care visit.  Test for candida (yeast) was positive.  Prescription for fluconazole 150mg  po now, repeat dose in 3d if needed, #2 no refills, sent to the pharmacy of record.  Recheck or followup with PCP for further evaluation if symptoms are not improving.    Patient contacted and made aware of    results, all questions answered

## 2018-12-13 LAB — GC/CHLAMYDIA PROBE AMP (~~LOC~~) NOT AT ARMC
Chlamydia: NEGATIVE
Neisseria Gonorrhea: NEGATIVE

## 2018-12-23 ENCOUNTER — Emergency Department (HOSPITAL_COMMUNITY)
Admission: EM | Admit: 2018-12-23 | Discharge: 2018-12-23 | Disposition: A | Discharge status: Home / Self Care | Payer: Self-pay | Attending: Emergency Medicine | Admitting: Emergency Medicine

## 2018-12-23 ENCOUNTER — Other Ambulatory Visit: Payer: Self-pay

## 2018-12-23 ENCOUNTER — Encounter (HOSPITAL_COMMUNITY): Payer: Self-pay

## 2018-12-23 DIAGNOSIS — R300 Dysuria: Secondary | ICD-10-CM | POA: Insufficient documentation

## 2018-12-23 DIAGNOSIS — N898 Other specified noninflammatory disorders of vagina: Secondary | ICD-10-CM | POA: Insufficient documentation

## 2018-12-23 LAB — WET PREP, GENITAL
Clue Cells Wet Prep HPF POC: NONE SEEN
Sperm: NONE SEEN
Trich, Wet Prep: NONE SEEN
Yeast Wet Prep HPF POC: NONE SEEN

## 2018-12-23 LAB — URINALYSIS, ROUTINE W REFLEX MICROSCOPIC
Bilirubin Urine: NEGATIVE
Glucose, UA: NEGATIVE mg/dL
Hgb urine dipstick: NEGATIVE
Ketones, ur: NEGATIVE mg/dL
Leukocytes,Ua: NEGATIVE
Nitrite: NEGATIVE
Protein, ur: NEGATIVE mg/dL
Specific Gravity, Urine: 1.012 (ref 1.005–1.030)
pH: 6 (ref 5.0–8.0)

## 2018-12-23 LAB — I-STAT BETA HCG BLOOD, ED (MC, WL, AP ONLY): I-stat hCG, quantitative: <5 m[IU]/mL (ref <5)

## 2018-12-23 LAB — HIV ANTIBODY (ROUTINE TESTING W REFLEX): HIV Screen 4th Generation wRfx: NONREACTIVE

## 2018-12-23 MED ORDER — IBUPROFEN 800 MG PO TABS
800.0000 mg | ORAL_TABLET | Freq: Once | ORAL | Status: AC
  Administered 2018-12-23: 800 mg via ORAL
  Filled 2018-12-23: qty 1

## 2018-12-23 NOTE — ED Triage Notes (Signed)
Patient presents with lower abdominal pain with dysuria, vaginal itching, and vaginal discharge since the first week of October. Patient was seen 12/11/18 and was given an Rx. Patient reports she finished the rx and her symptoms have not improved. Patient endorses white creamy vaginal discharge and vaginal itching. Patient reports LMP ended 12/15/18. Patient reports unprotected sex with one partner (2 year boyfriend) since LMP. Patient endoreses dysuria but denies blood in urine. Patient denies fever/chills/nausea/vomiting.

## 2018-12-23 NOTE — Discharge Instructions (Addendum)
Your testing today was without any signs of infection or abnormality.  Please follow-up with [facility] and call for an appointment at [facility] outpatient clinic.  You may also call [facility] as they have resources for [facility] care. Please take ibuprofen 600 mg spaced out with meals, three times a day for pain. Please return to ED if you have any new or concerning symptoms.

## 2018-12-23 NOTE — ED Provider Notes (Addendum)
[facility] DEPT Provider Note   CSN: [id number] Arrival date & time: 12/23/18  0818     History   Chief Complaint Chief Complaint  Patient presents with  . Dysuria  . Vaginal Discharge  . Abdominal Pain    HPI Sherri [name] is a 33 y.o. female.    HPI  Hx of fibroids, c-section   1 month of constant, unchanged sharp bilateral lower abdominal pain that is 8/10 that began gradually and has persisted. Patient has tried tylenol without relief (1g every other day). Patient has been seen by multiple providers and had pelvic exams conducted. Has been treated for UTI and Gonorrhea/Chlamydia without relief.   Endorses nausea that is constant without vomiting. Denies constipation LBM this morning was normal in appearance no blood or pain. LMP was recent and states her flow was heavier than normal; she states her menstrual periods have been progressively heavier over recent months.   On my personal review of the EMR patient has been evaluated multiple times in the past month for similar complaints.   Patient diagnosed and treated for BV and Candida on 9/21; seen again for same symptoms on 10/2 and treated empirically for gonorrhea and chlamydia.  Assessed again 10/8 discharged with Bactrim for 3 days. Seen 10/11 and treated for BV again.   Patient denies any constipation, diarrhea, vomiting, states all vaginal discharge she has had is normal for her.  Denies chest pain, shortness of breath.    Past Medical History:  Diagnosis Date  . Fibromyalgia    denies today  . Genital herpes   . Hypertension    gestational  . 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 Urinary tract infection   . Uterine fibroid     Patient Active Problem List   Diagnosis Date Noted  . Hypertension   . Genital herpes   . Yeast infection 12/03/2016  . Status post cesarean delivery 06/06/2014  . Uterine fibroid 12/25/2013  . Obesity 12/25/2013     Past Surgical History:  Procedure Laterality Date  . CESAREAN SECTION N/A 06/05/2014   Procedure: CESAREAN SECTION;  Surgeon: Truett Mainland, DO;  Location: [facility];  Service: Obstetrics;  Laterality: N/A;  . WISDOM TOOTH EXTRACTION       OB History    Gravida  1   Para  1   Term      Preterm  1   AB      Living  2     SAB      TAB      Ectopic      Multiple  1   Live Births  2            Home Medications    Prior to Admission medications   Medication Sig Start Date End Date Taking? Authorizing Provider  esomeprazole (NEXIUM) 40 MG capsule Take 1 capsule (40 mg total) by mouth daily. To reduce stomach acid Patient not taking: Reported on 12/23/2018 03/07/18   Fulp, Cammie, MD  fluconazole (DIFLUCAN) 150 MG tablet Take 150 mg by mouth once as needed. Vaginal irritation 12/12/18   [provider]  metroNIDAZOLE (FLAGYL) 500 MG tablet Take 1 tablet (500 mg total) by mouth 2 (two) times daily. 12/11/18   Kirichenko, Tatyana, PA-C  nystatin cream (MYCOSTATIN) Apply 1 application topically 3 (three) times daily. Patient not taking: Reported on 12/11/2018 10/17/18   Fulp, Cammie, MD  ondansetron (ZOFRAN) 4 MG tablet Take 1  tablet (4 mg total) by mouth every 8 (eight) hours as needed for nausea or vomiting. Patient not taking: Reported on 12/11/2018 10/17/18   Antony Blackbird, MD    Family History Family History  Problem Relation Age of Onset  . Hypertension Mother   . Hypertension Father   . Breast cancer Maternal Grandmother        63  . Colon cancer Neg Hx   . Colon polyps Neg Hx   . Kidney disease Neg Hx   . Diabetes Neg Hx   . Esophageal cancer Neg Hx   . Gallbladder disease Neg Hx   . Heart disease Neg Hx   . Asthma Neg Hx   . Cancer Neg Hx   . Stroke Neg Hx     Social History Social History   Tobacco Use  . Smoking status: Never Smoker  . Smokeless tobacco: Never Used  Substance Use Topics  . Alcohol use: No  . Drug use: No      Allergies   Macrobid [nitrofurantoin]   Review of Systems Review of Systems  Constitutional: Negative for chills and fever.  HENT: Negative for congestion.   Eyes: Negative for pain.  Respiratory: Negative for cough and shortness of breath.   Cardiovascular: Negative for chest pain and leg swelling.  Gastrointestinal: Positive for abdominal pain and nausea. Negative for vomiting.  Genitourinary: Positive for dysuria, pelvic pain and vaginal discharge. Negative for vaginal pain.  Musculoskeletal: Negative for myalgias.  Skin: Negative for rash.  Neurological: Negative for dizziness and headaches.     Physical Exam Updated Vital Signs BP 133/86 (BP Location: Right Arm)   Pulse 69   Temp 98.7 F (37.1 C) (Oral)   Resp 18   Ht 4\' 11"  (1.499 m)   Wt 88.5 kg   LMP 12/15/2018 (Exact Date)   SpO2 100%   BMI 39.39 kg/m   Physical Exam Vitals signs and nursing note reviewed. Exam conducted with a chaperone present.  Constitutional:      General: She is not in acute distress.    Comments: Well-appearing female in no acute distress  HENT:     Head: Normocephalic and atraumatic.     Nose: Nose normal.  Eyes:     General: No scleral icterus. Neck:     Musculoskeletal: Normal range of motion.  Cardiovascular:     Rate and Rhythm: Normal rate and regular rhythm.     Pulses: Normal pulses.     Heart sounds: Normal heart sounds.  Pulmonary:     Effort: Pulmonary effort is normal. No respiratory distress.     Breath sounds: No wheezing.  Abdominal:     Palpations: Abdomen is soft.     Tenderness: There is abdominal tenderness (Bilateral lower abdomen mildly tender to palpation; suprapubic tenderness.). There is no right CVA tenderness, left CVA tenderness, guarding or rebound.  Genitourinary:    General: Normal vulva.     Comments: Vulva without lesions or abnormality Vaginal canal with blood pooling Cervix appears normal, is closed No adnexal tenderness or CMT; lower  abdominal tenderness present during exam; no masses palpated No unusual discharge Musculoskeletal:     Right lower leg: No edema.     Left lower leg: No edema.  Skin:    General: Skin is warm and dry.     Capillary Refill: Capillary refill takes less than 2 seconds.  Neurological:     Mental Status: She is alert. Mental status is at baseline.  Psychiatric:  Mood and Affect: Mood normal.        Behavior: Behavior normal.      ED Treatments / Results  Labs (all labs ordered are listed, but only abnormal results are displayed) Labs Reviewed  WET PREP, GENITAL - Abnormal; Notable for the following components:      Result Value   WBC, Wet Prep HPF POC MODERATE (*)    All other components within normal limits  URINE CULTURE  URINALYSIS, ROUTINE W REFLEX MICROSCOPIC  RPR  HIV ANTIBODY (ROUTINE TESTING W REFLEX)  I-STAT BETA HCG BLOOD, ED ([facility], [facility], [facility] ONLY)  GC/CHLAMYDIA PROBE AMP ([facility]) NOT AT [facility]    EKG None  Radiology No results found.  Procedures Procedures (including critical care time)  Medications Ordered in ED Medications  ibuprofen (ADVIL) tablet 800 mg (800 mg Oral Given 12/23/18 1005)     Initial Impression / Assessment and Plan / ED Course  I have reviewed the triage vital signs and the nursing notes.  Pertinent labs & imaging results that were available during my care of the patient were reviewed by me and considered in my medical decision making (see chart for details).        Patient with lower abdominal pain, dysuria, vaginal itching for over 1 month.  Has been recently treated for BV, UTI, gonorrhea and chlamydia.  Patient with no abnormalities on UA today.  Moderate WBCs on wet prep likely due to irritation no evidence of yeast, trichomoniasis, BV.   HIV, RPR, GC/chlamydia and urine culture collected pending results.  Will not treat empirically for STI today as she was just recently treated and has negative test results recently and  states she is monogamous with partner who is also treated.  Doubt ovarian torsion as symptoms have been persistent for 1 month and are bilateral.  Doubt ectopic pregnancy as patient has negative pregnancy test and pain is diffusely bilateral and suprapubic.  Doubt PID as patient has no CMT and normal exam.   Suspect chronic etiology such as pelvic floor dysfunction, or enlargement of previously diagnosed fibroids and states she has had increasingly heavy menses over the past several months.  Recommended patient use Azo for dysuria and follow-up with [facility]; states she does not have insurance, also recommended [facility] for additional care.  Repeat examination.  Patient has unchanged abdominal exam no focal tenderness.  Generalized lower pelvic pain.  Vitals within normal limits.     The patient appears reasonably screened and/or stabilized for discharge and I doubt any other medical condition or other [facility] requiring further screening, evaluation, or treatment in the ED at this time prior to discharge.  Patient is hemodynamically stable, in NAD, and able to ambulate in the ED. Pain has been managed or a plan has been made for home management and has no complaints prior to discharge. Patient is comfortable with above plan and is stable for discharge at this time. All questions were answered prior to disposition. Results from the ER workup discussed with the patient face to face and all questions answered to the best of my ability. The patient is safe for discharge with strict return precautions. Patient appears safe for discharge with appropriate follow-up.  Conveyed my impression with the patient and he voiced understanding and is agreeable to plan.   An After Visit Summary was printed and given to the patient.  Portions of this note were generated with [employer]. Dictation errors may  occur despite best attempts at proofreading.     Final Clinical Impressions(s) / ED Diagnoses   Final diagnoses:  Dysuria    ED Discharge Orders    None       Tedd Sias, [place] 12/23/18 Schneider, [place], [place] 12/23/18 1706    Dorie Rank, MD 12/25/18 1207

## 2018-12-24 LAB — RPR: RPR Ser Ql: NONREACTIVE

## 2018-12-24 LAB — URINE CULTURE: Culture: <10000 — AB

## 2018-12-26 LAB — GC/CHLAMYDIA PROBE AMP (~~LOC~~) NOT AT ARMC
Chlamydia: NEGATIVE
Neisseria Gonorrhea: NEGATIVE

## 2018-12-30 ENCOUNTER — Ambulatory Visit (HOSPITAL_COMMUNITY)
Admission: EM | Admit: 2018-12-30 | Discharge: 2018-12-30 | Disposition: A | Discharge status: Home / Self Care | Payer: Self-pay | Attending: Family Medicine | Admitting: Family Medicine

## 2018-12-30 ENCOUNTER — Encounter (HOSPITAL_COMMUNITY): Payer: Self-pay

## 2018-12-30 ENCOUNTER — Other Ambulatory Visit: Payer: Self-pay

## 2018-12-30 DIAGNOSIS — R300 Dysuria: Secondary | ICD-10-CM

## 2018-12-30 DIAGNOSIS — J0101 Acute recurrent maxillary sinusitis: Secondary | ICD-10-CM

## 2018-12-30 LAB — POCT URINALYSIS DIP (DEVICE)
Bilirubin Urine: NEGATIVE
Glucose, UA: NEGATIVE mg/dL
Hgb urine dipstick: NEGATIVE
Ketones, ur: NEGATIVE mg/dL
Leukocytes,Ua: NEGATIVE
Nitrite: NEGATIVE
Protein, ur: NEGATIVE mg/dL
Specific Gravity, Urine: 1.025 (ref 1.005–1.030)
Urobilinogen, UA: 0.2 mg/dL (ref 0.0–1.0)
pH: 7 (ref 5.0–8.0)

## 2018-12-30 MED ORDER — AMOXICILLIN 875 MG PO TABS: 875.0000 mg | ORAL_TABLET | Freq: Two times a day (BID) | ORAL | 0 refills | Status: DC

## 2018-12-30 NOTE — ED Triage Notes (Signed)
Pt presents to UC w/ c/o sore ears and congested x3 days. Pt also reports burning on urination, urinary urgency x3 days

## 2018-12-30 NOTE — Discharge Instructions (Addendum)
Continue the Flonase

## 2018-12-30 NOTE — ED Provider Notes (Signed)
[facility]    CSN: [id number] Arrival date & time: 12/30/18  1203      History   Chief Complaint Chief Complaint  Patient presents with  . congested ears, burning on urination    HPI Sherri [name] is a 33 y.o. female.   Established [facility] patient  Pt presents to UC w/ c/o sore ears and congested x3 days. Pt also reports burning on urination, urinary urgency x3 days  No fever.  No bloody nasal discharge.  Some fullness in cheeks and patient does have a h/o allergies.  Patient was seen in ED one week ago and urine, vaginal swab and blood work was all negative.     Past Medical History:  Diagnosis Date  . Fibromyalgia    denies today  . Genital herpes   . Hypertension    gestational  . 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 Urinary tract infection   . Uterine fibroid     Patient Active Problem List   Diagnosis Date Noted  . Hypertension   . Genital herpes   . Yeast infection 12/03/2016  . Status post cesarean delivery 06/06/2014  . Uterine fibroid 12/25/2013  . Obesity 12/25/2013    Past Surgical History:  Procedure Laterality Date  . CESAREAN SECTION N/A 06/05/2014   Procedure: CESAREAN SECTION;  Surgeon: Truett Mainland, DO;  Location: [facility];  Service: Obstetrics;  Laterality: N/A;  . WISDOM TOOTH EXTRACTION      OB History    Gravida  1   Para  1   Term      Preterm  1   AB      Living  2     SAB      TAB      Ectopic      Multiple  1   Live Births  2            Home Medications    Prior to Admission medications   Medication Sig Start Date End Date Taking? Authorizing Provider  amoxicillin (AMOXIL) 875 MG tablet Take 1 tablet (875 mg total) by mouth 2 (two) times daily. 12/30/18   Robyn Haber, MD  esomeprazole (NEXIUM) 40 MG capsule Take 1 capsule (40 mg total) by mouth daily. To reduce stomach acid Patient not taking: Reported on 12/23/2018 03/07/18 12/30/18  Antony Blackbird, MD     Family History Family History  Problem Relation Age of Onset  . Hypertension Mother   . Hypertension Father   . Breast cancer Maternal Grandmother        73  . Colon cancer Neg Hx   . Colon polyps Neg Hx   . Kidney disease Neg Hx   . Diabetes Neg Hx   . Esophageal cancer Neg Hx   . Gallbladder disease Neg Hx   . Heart disease Neg Hx   . Asthma Neg Hx   . Cancer Neg Hx   . Stroke Neg Hx     Social History Social History   Tobacco Use  . Smoking status: Never Smoker  . Smokeless tobacco: Never Used  Substance Use Topics  . Alcohol use: No  . Drug use: No     Allergies   Macrobid [nitrofurantoin]   Review of Systems Review of Systems  Constitutional: Negative.   HENT: Positive for congestion and sinus pressure.   Genitourinary: Positive for dysuria and frequency.  All other systems reviewed and are negative.    Physical Exam  Triage Vital Signs ED Triage Vitals  Enc Vitals Group     BP 12/30/18 1241 129/75     Pulse Rate 12/30/18 1238 78     Resp 12/30/18 1238 16     Temp 12/30/18 1238 98.7 F (37.1 C)     Temp Source 12/30/18 1238 Oral     SpO2 12/30/18 1238 100 %     Weight --      Height --      Head Circumference --      Peak Flow --      Pain Score 12/30/18 1241 7     Pain Loc --      Pain Edu? --      Excl. in [place]? --    No data found.  Updated Vital Signs BP 129/75 (BP Location: Left Arm)   Pulse 78   Temp 98.7 F (37.1 C) (Oral)   Resp 16   LMP 12/15/2018 (Exact Date)   SpO2 100%    Physical Exam Vitals signs and nursing note reviewed.  Constitutional:      Appearance: Normal appearance. She is obese.  HENT:     Head: Normocephalic.     Right Ear: Ear canal and external ear normal. There is no impacted cerumen.     Left Ear: Ear canal and external ear normal. There is no impacted cerumen.     Ears:     Comments: Opaque cloudy TM's    Nose: Congestion present.     Mouth/Throat:     Mouth: Mucous membranes are moist.      Pharynx: Oropharynx is clear.  Eyes:     Conjunctiva/sclera: Conjunctivae normal.  Neck:     Musculoskeletal: Normal range of motion and neck supple.  Cardiovascular:     Rate and Rhythm: Normal rate and regular rhythm.     Pulses: Normal pulses.     Heart sounds: Normal heart sounds.  Pulmonary:     Effort: Pulmonary effort is normal.     Breath sounds: Normal breath sounds.  Musculoskeletal: Normal range of motion.  Skin:    General: Skin is warm and dry.  Neurological:     General: No focal deficit present.     Mental Status: She is alert and oriented to person, place, and time.  Psychiatric:        Mood and Affect: Mood normal.        Behavior: Behavior normal.        Thought Content: Thought content normal.      UC Treatments / Results  Labs (all labs ordered are listed, but only abnormal results are displayed) Labs Reviewed  POCT URINALYSIS DIP (DEVICE)    EKG   Radiology No results found.  Procedures Procedures (including critical care time)  Medications Ordered in UC Medications - No data to display  Initial Impression / Assessment and Plan / UC Course  I have reviewed the triage vital signs and the nursing notes.  Pertinent labs & imaging results that were available during my care of the patient were reviewed by me and considered in my medical decision making (see chart for details).    Final Clinical Impressions(s) / UC Diagnoses   Final diagnoses:  Dysuria  Acute recurrent maxillary sinusitis     Discharge Instructions     Continue the Flonase    ED Prescriptions    Medication Sig Dispense Auth. Provider   amoxicillin (AMOXIL) 875 MG tablet Take 1 tablet (875 mg total) by mouth  2 (two) times daily. 20 tablet Robyn Haber, MD     I have reviewed the PDMP during this encounter.   Robyn Haber, MD 12/30/18 1259

## 2019-01-03 ENCOUNTER — Ambulatory Visit (HOSPITAL_COMMUNITY)
Admission: EM | Admit: 2019-01-03 | Discharge: 2019-01-03 | Disposition: A | Discharge status: Home / Self Care | Payer: Self-pay | Attending: Emergency Medicine | Admitting: Emergency Medicine

## 2019-01-03 ENCOUNTER — Encounter (HOSPITAL_COMMUNITY): Payer: Self-pay | Admitting: Emergency Medicine

## 2019-01-03 ENCOUNTER — Other Ambulatory Visit: Payer: Self-pay

## 2019-01-03 DIAGNOSIS — T50905A Adverse effect of unspecified drugs, medicaments and biological substances, initial encounter: Secondary | ICD-10-CM

## 2019-01-03 MED ORDER — ONDANSETRON HCL 4 MG PO TABS: 4.0000 mg | ORAL_TABLET | Freq: Four times a day (QID) | ORAL | 0 refills | Status: DC

## 2019-01-03 NOTE — ED Provider Notes (Signed)
[facility]    CSN: [id number] Arrival date & time: 01/03/19  1558      History   Chief Complaint Chief Complaint  Patient presents with  . Facial Pain    HPI Sherri [name] is a 33 y.o. female.   Patient presents with a 4-day history of nausea, diarrhea, and headache.  She believes this is from taking amoxicillin which she started on 5 days ago.  She denies rash, fever, chills, sore throat, cough, shortness of breath, vomiting, or other symptoms.  No treatments attempted at home.  Patient was seen here on 12/30/2018 and treated with amoxicillin for sinusitis and dysuria.  LMP: 01/03/2019  The history is provided by the patient.    Past Medical History:  Diagnosis Date  . Fibromyalgia    denies today  . Genital herpes   . Hypertension    gestational  . 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 Urinary tract infection   . Uterine fibroid     Patient Active Problem List   Diagnosis Date Noted  . Hypertension   . Genital herpes   . Yeast infection 12/03/2016  . Status post cesarean delivery 06/06/2014  . Uterine fibroid 12/25/2013  . Obesity 12/25/2013    Past Surgical History:  Procedure Laterality Date  . CESAREAN SECTION N/A 06/05/2014   Procedure: CESAREAN SECTION;  Surgeon: Truett Mainland, DO;  Location: [facility];  Service: Obstetrics;  Laterality: N/A;  . WISDOM TOOTH EXTRACTION      OB History    Gravida  1   Para  1   Term      Preterm  1   AB      Living  2     SAB      TAB      Ectopic      Multiple  1   Live Births  2            Home Medications    Prior to Admission medications   Medication Sig Start Date End Date Taking? Authorizing Provider  amoxicillin (AMOXIL) 875 MG tablet Take 1 tablet (875 mg total) by mouth 2 (two) times daily. 12/30/18  Yes Robyn Haber, MD  ondansetron (ZOFRAN) 4 MG tablet Take 1 tablet (4 mg total) by mouth every 6 (six) hours. 01/03/19   Sharion Balloon, NP   esomeprazole (NEXIUM) 40 MG capsule Take 1 capsule (40 mg total) by mouth daily. To reduce stomach acid Patient not taking: Reported on 12/23/2018 03/07/18 12/30/18  Antony Blackbird, MD    Family History Family History  Problem Relation Age of Onset  . Hypertension Mother   . Hypertension Father   . Breast cancer Maternal Grandmother        36  . Colon cancer Neg Hx   . Colon polyps Neg Hx   . Kidney disease Neg Hx   . Diabetes Neg Hx   . Esophageal cancer Neg Hx   . Gallbladder disease Neg Hx   . Heart disease Neg Hx   . Asthma Neg Hx   . Cancer Neg Hx   . Stroke Neg Hx     Social History Social History   Tobacco Use  . Smoking status: Never Smoker  . Smokeless tobacco: Never Used  Substance Use Topics  . Alcohol use: No  . Drug use: No     Allergies   Macrobid [nitrofurantoin]   Review of Systems Review of Systems  Constitutional: Negative  for chills and fever.  HENT: Negative for ear pain and sore throat.   Eyes: Negative for pain and visual disturbance.  Respiratory: Negative for cough and shortness of breath.   Cardiovascular: Negative for chest pain and palpitations.  Gastrointestinal: Positive for diarrhea and nausea. Negative for abdominal pain and vomiting.  Genitourinary: Negative for dysuria and hematuria.  Musculoskeletal: Negative for arthralgias and back pain.  Skin: Negative for color change and rash.  Neurological: Positive for headaches. Negative for dizziness, seizures, syncope, weakness and numbness.  All other systems reviewed and are negative.    Physical Exam Triage Vital Signs ED Triage Vitals  Enc Vitals Group     BP 01/03/19 1645 (!) 142/89     Pulse --      Resp 01/03/19 1645 16     Temp 01/03/19 1645 98.6 F (37 C)     Temp Source 01/03/19 1645 Oral     SpO2 01/03/19 1645 100 %     Weight --      Height --      Head Circumference --      Peak Flow --      Pain Score 01/03/19 1643 8     Pain Loc --      Pain Edu? --       Excl. in [place]? --    No data found.  Updated Vital Signs BP (!) 142/89   Temp 98.6 F (37 C) (Oral)   Resp 16   LMP 01/03/2019   SpO2 100%   Visual Acuity Right Eye Distance:   Left Eye Distance:   Bilateral Distance:    Right Eye Near:   Left Eye Near:    Bilateral Near:     Physical Exam Vitals signs and nursing note reviewed.  Constitutional:      General: She is not in acute distress.    Appearance: She is well-developed. She is not ill-appearing.  HENT:     Head: Normocephalic and atraumatic.     Right Ear: Tympanic membrane normal.     Left Ear: Tympanic membrane normal.     Nose: Nose normal.     Mouth/Throat:     Mouth: Mucous membranes are moist.     Pharynx: Oropharynx is clear.  Eyes:     Conjunctiva/sclera: Conjunctivae normal.  Neck:     Musculoskeletal: Neck supple.  Cardiovascular:     Rate and Rhythm: Normal rate and regular rhythm.     Heart sounds: No murmur.  Pulmonary:     Effort: Pulmonary effort is normal. No respiratory distress.     Breath sounds: Normal breath sounds.  Abdominal:     General: Bowel sounds are normal.     Palpations: Abdomen is soft.     Tenderness: There is no abdominal tenderness. There is no guarding or rebound.  Skin:    General: Skin is warm and dry.     Findings: No rash.  Neurological:     Mental Status: She is alert.      UC Treatments / Results  Labs (all labs ordered are listed, but only abnormal results are displayed) Labs Reviewed - No data to display  EKG   Radiology No results found.  Procedures Procedures (including critical care time)  Medications Ordered in UC Medications - No data to display  Initial Impression / Assessment and Plan / UC Course  I have reviewed the triage vital signs and the nursing notes.  Pertinent labs & imaging results that were  available during my care of the patient were reviewed by me and considered in my medical decision making (see chart for details).     Adverse reaction to amoxicillin.  Patient is well-appearing and her exam is unremarkable.  Instructed patient to stop taking the amoxicillin.  Treating with Zofran as needed for nausea.  Instructed patient to follow-up with her PCP if her symptoms or not improving.  Patient agrees to plan of care.     Final Clinical Impressions(s) / UC Diagnoses   Final diagnoses:  Adverse effect of drug, initial encounter     Discharge Instructions     Stop taking the amoxicillin.    Take the prescribed antinausea medication as needed.    Follow-up with your primary care provider if your symptoms are not improving.      ED Prescriptions    Medication Sig Dispense Auth. Provider   ondansetron (ZOFRAN) 4 MG tablet Take 1 tablet (4 mg total) by mouth every 6 (six) hours. 12 tablet Sharion Balloon, NP     PDMP not reviewed this encounter.   Sharion Balloon, NP 01/03/19 1708

## 2019-01-03 NOTE — ED Triage Notes (Signed)
PT was seen 10/30 and given amoxicillin for ear infection. PT feels worse, has facial pain / pounding sensation. Generally doesn't feel well.

## 2019-01-03 NOTE — Discharge Instructions (Addendum)
Stop taking the amoxicillin.    Take the prescribed antinausea medication as needed.    Follow-up with your primary care provider if your symptoms are not improving.

## 2019-01-05 ENCOUNTER — Encounter (HOSPITAL_COMMUNITY): Payer: Self-pay | Admitting: Emergency Medicine

## 2019-01-05 ENCOUNTER — Emergency Department (HOSPITAL_COMMUNITY)
Admission: EM | Admit: 2019-01-05 | Discharge: 2019-01-05 | Disposition: A | Discharge status: Home / Self Care | Payer: Self-pay | Attending: Emergency Medicine | Admitting: Emergency Medicine

## 2019-01-05 ENCOUNTER — Other Ambulatory Visit: Payer: Self-pay

## 2019-01-05 DIAGNOSIS — I10 Essential (primary) hypertension: Secondary | ICD-10-CM | POA: Insufficient documentation

## 2019-01-05 DIAGNOSIS — H9210 Otorrhea, unspecified ear: Secondary | ICD-10-CM | POA: Insufficient documentation

## 2019-01-05 DIAGNOSIS — R519 Headache, unspecified: Secondary | ICD-10-CM | POA: Insufficient documentation

## 2019-01-05 DIAGNOSIS — J019 Acute sinusitis, unspecified: Secondary | ICD-10-CM | POA: Insufficient documentation

## 2019-01-05 MED ORDER — CLARITHROMYCIN 500 MG PO TABS: 500.0000 mg | ORAL_TABLET | Freq: Two times a day (BID) | ORAL | 0 refills | Status: DC

## 2019-01-05 NOTE — ED Triage Notes (Signed)
Per patient, was put on amoxicillin for an B/L ear  Infection-states she is still have head pressure and ear pain-states she was seen on 11/3 for same symptoms

## 2019-01-05 NOTE — ED Provider Notes (Signed)
[facility] DEPT Provider Note   CSN: [id number] Arrival date & time: 01/05/19  1417     History   Chief Complaint Chief Complaint  Patient presents with  . head pressure    HPI Sherri [name] is a 33 y.o. female.     HPI   She presents for evaluation of head pressure, rhinorrhea, face pain, ear drainage, present for 1 week, not improving since on Augmentin for the last 4 days.  She denies fever, chills, cough, shortness of breath, weakness or dizziness.  There are no other known modifying factors.  Past Medical History:  Diagnosis Date  . Fibromyalgia    denies today  . Genital herpes   . Hypertension    gestational  . 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 Urinary tract infection   . Uterine fibroid     Patient Active Problem List   Diagnosis Date Noted  . Hypertension   . Genital herpes   . Yeast infection 12/03/2016  . Status post cesarean delivery 06/06/2014  . Uterine fibroid 12/25/2013  . Obesity 12/25/2013    Past Surgical History:  Procedure Laterality Date  . CESAREAN SECTION N/A 06/05/2014   Procedure: CESAREAN SECTION;  Surgeon: Truett Mainland, DO;  Location: [facility];  Service: Obstetrics;  Laterality: N/A;  . WISDOM TOOTH EXTRACTION       OB History    Gravida  1   Para  1   Term      Preterm  1   AB      Living  2     SAB      TAB      Ectopic      Multiple  1   Live Births  2            Home Medications    Prior to Admission medications   Medication Sig Start Date End Date Taking? Authorizing Provider  clarithromycin (BIAXIN) 500 MG tablet Take 1 tablet (500 mg total) by mouth 2 (two) times daily. 01/05/19   Daleen Bo, MD  ondansetron (ZOFRAN) 4 MG tablet Take 1 tablet (4 mg total) by mouth every 6 (six) hours. 01/03/19   Sharion Balloon, NP  esomeprazole (NEXIUM) 40 MG capsule Take 1 capsule (40 mg total) by mouth daily. To reduce stomach acid Patient not  taking: Reported on 12/23/2018 03/07/18 12/30/18  Antony Blackbird, MD    Family History Family History  Problem Relation Age of Onset  . Hypertension Mother   . Hypertension Father   . Breast cancer Maternal Grandmother        37  . Colon cancer Neg Hx   . Colon polyps Neg Hx   . Kidney disease Neg Hx   . Diabetes Neg Hx   . Esophageal cancer Neg Hx   . Gallbladder disease Neg Hx   . Heart disease Neg Hx   . Asthma Neg Hx   . Cancer Neg Hx   . Stroke Neg Hx     Social History Social History   Tobacco Use  . Smoking status: Never Smoker  . Smokeless tobacco: Never Used  Substance Use Topics  . Alcohol use: No  . Drug use: No     Allergies   Macrobid [nitrofurantoin]   Review of Systems Review of Systems  All other systems reviewed and are negative.    Physical Exam Updated Vital Signs BP 133/82 (BP Location: Right Arm)   Pulse  69   Temp 98.6 F (37 C) (Oral)   Resp 16   LMP 01/03/2019   SpO2 99%   Physical Exam Vitals signs and nursing note reviewed.  Constitutional:      General: She is not in acute distress.    Appearance: She is well-developed. She is ill-appearing. She is not toxic-appearing or diaphoretic.  HENT:     Head: Normocephalic and atraumatic.     Right Ear: External ear normal. There is no impacted cerumen.     Left Ear: External ear normal. There is no impacted cerumen.     Nose: No congestion or rhinorrhea.     Comments: Tender paranasal sinuses with percussion.    Mouth/Throat:     Pharynx: No oropharyngeal exudate or posterior oropharyngeal erythema.  Eyes:     Conjunctiva/sclera: Conjunctivae normal.     Pupils: Pupils are equal, round, and reactive to light.  Neck:     Musculoskeletal: Normal range of motion and neck supple.     Trachea: Phonation normal.  Cardiovascular:     Rate and Rhythm: Normal rate and regular rhythm.     Heart sounds: Normal heart sounds.  Pulmonary:     Effort: Pulmonary effort is normal.     Breath  sounds: Normal breath sounds.  Musculoskeletal: Normal range of motion.        General: No swelling or tenderness.  Skin:    General: Skin is warm and dry.  Neurological:     Mental Status: She is alert and oriented to person, place, and time.     Cranial Nerves: No cranial nerve deficit.     Sensory: No sensory deficit.     Motor: No abnormal muscle tone.     Coordination: Coordination normal.  Psychiatric:        Mood and Affect: Mood normal.        Behavior: Behavior normal.        Thought Content: Thought content normal.        Judgment: Judgment normal.      ED Treatments / Results  Labs (all labs ordered are listed, but only abnormal results are displayed) Labs Reviewed - No data to display  EKG None  Radiology No results found.  Procedures Procedures (including critical care time)  Medications Ordered in ED Medications - No data to display   Initial Impression / Assessment and Plan / ED Course  I have reviewed the triage vital signs and the nursing notes.  Pertinent labs & imaging results that were available during my care of the patient were reviewed by me and considered in my medical decision making (see chart for details).         Patient Vitals for the past 24 hrs:  BP Temp Temp src Pulse Resp SpO2  01/05/19 1455 133/82 98.6 F (37 C) Oral 69 16 99 %    5:15 PM Reevaluation with update and discussion. After initial assessment and treatment, an updated evaluation reveals no change in clinical status, findings cussed with the patient and all questions were answered. Daleen Bo   Medical Decision Making: Patient with signs and symptoms of acute sinusitis, not tolerating Augmentin and no improvement after 4 days of treatment.  Doubt systemic infection or metabolic instability.  Will change to Biaxin, and encouraged twice daily usage of Flonase, to improve symptoms.  CRITICAL CARE-no Performed by: Daleen Bo  Nursing Notes Reviewed/ Care  Coordinated Applicable Imaging Reviewed Interpretation of Laboratory Data incorporated into ED treatment  The patient appears reasonably screened and/or stabilized for discharge and I doubt any other medical condition or other [facility] requiring further screening, evaluation, or treatment in the ED at this time prior to discharge.  Plan: Home Medications-stop Augmentin, use OTC analgesia of choice, and Flonase both nostrils twice a day.; Home Treatments-rest, fluids; return here if the recommended treatment, does not improve the symptoms; Recommended follow up-PCP if not better in 2 or 3 days.   Final Clinical Impressions(s) / ED Diagnoses   Final diagnoses:  Acute sinusitis, recurrence not specified, unspecified location    ED Discharge Orders         Ordered    clarithromycin (BIAXIN) 500 MG tablet  2 times daily     01/05/19 1713           Daleen Bo, MD 01/05/19 1715

## 2019-01-05 NOTE — Discharge Instructions (Addendum)
Make sure you are drinking plenty of fluids, especially water.  Use your Flonase nasal spray 1 squirt in each nostril, twice a day for at least 2 weeks.  Stop taking the amoxicillin with clavulanic acid at this time.  Start the new prescription, clarithromycin, as soon as possible.  For pain, use Tylenol or Motrin.  Follow-up with your doctor if not better in 2 or 3 days.

## 2019-01-10 MED FILL — ESOMEPRAZOLE MAG DR 40 MG C: 40 | 30 days supply | Qty: 30 | Fill #2

## 2019-01-17 ENCOUNTER — Other Ambulatory Visit: Payer: Self-pay

## 2019-01-17 DIAGNOSIS — Z20822 Contact with and (suspected) exposure to covid-19: Secondary | ICD-10-CM

## 2019-01-18 LAB — NOVEL CORONAVIRUS, NAA: SARS-CoV-2, NAA: NOT DETECTED

## 2019-01-19 ENCOUNTER — Ambulatory Visit: Payer: HRSA Program | Attending: Family Medicine | Admitting: Physician Assistant

## 2019-01-19 ENCOUNTER — Other Ambulatory Visit: Payer: Self-pay

## 2019-01-19 DIAGNOSIS — Z20828 Contact with and (suspected) exposure to other viral communicable diseases: Secondary | ICD-10-CM

## 2019-01-19 DIAGNOSIS — R110 Nausea: Secondary | ICD-10-CM

## 2019-01-19 DIAGNOSIS — N3000 Acute cystitis without hematuria: Secondary | ICD-10-CM | POA: Diagnosis not present

## 2019-01-19 DIAGNOSIS — Z20822 Contact with and (suspected) exposure to covid-19: Secondary | ICD-10-CM

## 2019-01-19 MED ORDER — SULFAMETHOXAZOLE-TRIMETHOPRIM 800-160 MG PO TABS: 1.0000 | ORAL_TABLET | Freq: Two times a day (BID) | ORAL | 0 refills | Status: DC

## 2019-01-19 MED ORDER — FLUCONAZOLE 150 MG PO TABS: 150.0000 mg | ORAL_TABLET | Freq: Once | ORAL | 0 refills | Status: AC

## 2019-01-19 MED ORDER — ONDANSETRON HCL 4 MG PO TABS: 4.0000 mg | ORAL_TABLET | Freq: Four times a day (QID) | ORAL | 0 refills | Status: DC

## 2019-01-19 NOTE — Progress Notes (Signed)
Virtual Visit via Telephone Note  I connected with Sherri [name] on 01/19/19 at  1:30 PM EST by telephone and verified that I am speaking with the correct person using two identifiers.   I discussed the limitations, risks, security and privacy concerns of performing an evaluation and management service by telephone and the availability of in person appointments. I also discussed with the patient that there may be a patient responsible charge related to this service. The patient expressed understanding and agreed to proceed.  Patient location:  home My Location:  [place] office Persons on the call:  Me and the patient.    History of Present Illness: Boyfriend with Covid.  She last saw him Sunday and he tested positive Monday.  He was having symptoms when they were together.  She is having runny nose, mild cough, no SOB.  She is having nausea and some vomiting along with mild diarrhea.  She is keeping most of the food she eats down.  She went and got a covid test done 2 days ago that was negative.  She denies fever.  She has also been having several days of burning with urination.  No abdominal pain.  No pelvic pain or vaginal discharge.  No changes in taste or smell.  No flank pain.  She does have some body aches    Observations/Objective:  NAD.  A&Ox3   Assessment and Plan:   1. Nausea - ondansetron (ZOFRAN) 4 MG tablet; Take 1 tablet (4 mg total) by mouth every 6 (six) hours.  Dispense: 30 tablet; Refill: 0  2. Acute cystitis without hematuria Will cover bc can't have her come in and do a urine due to covid exposure and symptoms.  Septra should cover most UTI.   - fluconazole (DIFLUCAN) 150 MG tablet; Take 1 tablet (150 mg total) by mouth once for 1 dose.  Dispense: 1 tablet; Refill: 0 - sulfamethoxazole-trimethoprim (BACTRIM DS) 800-160 MG tablet; Take 1 tablet by mouth 2 (two) times daily.  Dispense: 10 tablet; Refill: 0  3. Suspected COVID-19 virus infection Fluids, rest, respiratory  care.  Drink 80-100 ounces water daily.  Quarantine X 2 weeks since exposure.  To ED if worsens/develops respiratory distress.    Follow Up Instructions: See PCP in 1 month   I discussed the assessment and treatment plan with the patient. The patient was provided an opportunity to ask questions and all were answered. The patient agreed with the plan and demonstrated an understanding of the instructions.   The patient was advised to call back or seek an in-person evaluation if the symptoms worsen or if the condition fails to improve as anticipated.  I provided 14 minutes of non-face-to-face time during this encounter.   Freeman Caldron, PA-C  Patient ID: Sherri [name], female   DOB: November 24, 1985, 33 y.o.   MRN: [id number]

## 2019-01-19 NOTE — Progress Notes (Signed)
Patient name and DOB has been verified Patient was informed of lab results. Patient had no questions.  Patient states that she has been having diarrhea and nausea.  Patient is having vaginal burning.

## 2019-01-28 ENCOUNTER — Other Ambulatory Visit: Payer: Self-pay

## 2019-01-28 ENCOUNTER — Encounter (HOSPITAL_COMMUNITY): Payer: Self-pay

## 2019-01-28 ENCOUNTER — Ambulatory Visit (HOSPITAL_COMMUNITY)
Admission: EM | Admit: 2019-01-28 | Discharge: 2019-01-28 | Disposition: A | Discharge status: Home / Self Care | Payer: Self-pay | Attending: Physician Assistant | Admitting: Physician Assistant

## 2019-01-28 DIAGNOSIS — Z3202 Encounter for pregnancy test, result negative: Secondary | ICD-10-CM

## 2019-01-28 DIAGNOSIS — R1030 Lower abdominal pain, unspecified: Secondary | ICD-10-CM | POA: Insufficient documentation

## 2019-01-28 LAB — POCT URINALYSIS DIP (DEVICE)
Bilirubin Urine: NEGATIVE
Glucose, UA: NEGATIVE mg/dL
Ketones, ur: NEGATIVE mg/dL
Leukocytes,Ua: NEGATIVE
Nitrite: NEGATIVE
Protein, ur: NEGATIVE mg/dL
Specific Gravity, Urine: >=1.03 (ref 1.005–1.030)
Urobilinogen, UA: 0.2 mg/dL (ref 0.0–1.0)
pH: 6 (ref 5.0–8.0)

## 2019-01-28 LAB — POC URINE PREG, ED: Preg Test, Ur: NEGATIVE

## 2019-01-28 LAB — POCT PREGNANCY, URINE: Preg Test, Ur: NEGATIVE

## 2019-01-28 MED ORDER — IBUPROFEN 800 MG PO TABS
800.0000 mg | ORAL_TABLET | Freq: Once | ORAL | Status: AC
  Administered 2019-01-28: 800 mg via ORAL

## 2019-01-28 MED ORDER — IBUPROFEN 800 MG PO TABS
ORAL_TABLET | ORAL | Status: AC
  Filled 2019-01-28: qty 1

## 2019-01-28 MED ORDER — ONDANSETRON 4 MG PO TBDP: 4.0000 mg | ORAL_TABLET | Freq: Three times a day (TID) | ORAL | 0 refills | Status: DC | PRN

## 2019-01-28 MED ORDER — NAPROXEN 500 MG PO TABS: 500.0000 mg | ORAL_TABLET | Freq: Two times a day (BID) | ORAL | 0 refills | Status: DC

## 2019-01-28 NOTE — ED Triage Notes (Signed)
Pt presents to the UC with burnings sensation when urinating, back pain, abdominal pain and nausea x 3 days.

## 2019-01-28 NOTE — Discharge Instructions (Signed)
Naprosyn twice daily for pain Zofran for nausea Swab pending to check for any causes contributing to pain Follow up if symptoms worsening or not improving

## 2019-01-29 LAB — URINE CULTURE: Culture: 40000 — AB

## 2019-01-29 NOTE — ED Provider Notes (Signed)
[facility]    CSN: [id number] Arrival date & time: 01/28/19  1420      History   Chief Complaint Chief Complaint  Patient presents with  . Abdominal Pain  . Back Pain  . Nausea  . Dysuria    HPI Sherri [name] is a 33 y.o. female history of hypertension, presenting today for evaluation of abdominal pain.  Patient states that she started her menstrual cycle approximately 3 days ago.  She has had a lot of cramping associated with this.  States that her abdominal pain is mainly lower and seems to be associated with her menstrual cycle.  She also notes her flow has been heavier than normal.  She has had associated nausea, but denies vomiting.  She also notes for a while she has had some dysuria.  Denies of normal discharge.  Denies itching or irritation.  Patient had virtual visit approximately 1 week ago where she was given Bactrim and Diflucan.  Patient completed the course of these medicines.  She states that she has not been sexually active recently and denies specific concerns for STDs.  She does note a history of bacterial vaginosis.  HPI  Past Medical History:  Diagnosis Date  . Fibromyalgia    denies today  . Genital herpes   . Hypertension    gestational  . 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 Urinary tract infection   . Uterine fibroid     Patient Active Problem List   Diagnosis Date Noted  . Hypertension   . Genital herpes   . Yeast infection 12/03/2016  . Status post cesarean delivery 06/06/2014  . Uterine fibroid 12/25/2013  . Obesity 12/25/2013    Past Surgical History:  Procedure Laterality Date  . CESAREAN SECTION N/A 06/05/2014   Procedure: CESAREAN SECTION;  Surgeon: Truett Mainland, DO;  Location: [facility];  Service: Obstetrics;  Laterality: N/A;  . WISDOM TOOTH EXTRACTION      OB History    Gravida  1   Para  1   Term      Preterm  1   AB      Living  2     SAB      TAB      Ectopic      Multiple  1   Live Births  2            Home Medications    Prior to Admission medications   Medication Sig Start Date End Date Taking? Authorizing Provider  naproxen (NAPROSYN) 500 MG tablet Take 1 tablet (500 mg total) by mouth 2 (two) times daily. 01/28/19   ,  C, PA-C  ondansetron (ZOFRAN ODT) 4 MG disintegrating tablet Take 1 tablet (4 mg total) by mouth every 8 (eight) hours as needed for nausea or vomiting. 01/28/19   ,  C, PA-C  esomeprazole (NEXIUM) 40 MG capsule Take 1 capsule (40 mg total) by mouth daily. To reduce stomach acid Patient not taking: Reported on 12/23/2018 03/07/18 12/30/18  Antony Blackbird, MD    Family History Family History  Problem Relation Age of Onset  . Hypertension Mother   . Hypertension Father   . Breast cancer Maternal Grandmother        88  . Colon cancer Neg Hx   . Colon polyps Neg Hx   . Kidney disease Neg Hx   . Diabetes Neg Hx   . Esophageal cancer Neg Hx   . Gallbladder  disease Neg Hx   . Heart disease Neg Hx   . Asthma Neg Hx   . Cancer Neg Hx   . Stroke Neg Hx     Social History Social History   Tobacco Use  . Smoking status: Never Smoker  . Smokeless tobacco: Never Used  Substance Use Topics  . Alcohol use: No  . Drug use: No     Allergies   Macrobid [nitrofurantoin]   Review of Systems Review of Systems  Constitutional: Negative for fever.  Respiratory: Negative for shortness of breath.   Cardiovascular: Negative for chest pain.  Gastrointestinal: Positive for abdominal pain and nausea. Negative for diarrhea and vomiting.  Genitourinary: Positive for dysuria, frequency and menstrual problem. Negative for flank pain, genital sores, hematuria, vaginal bleeding, vaginal discharge and vaginal pain.  Musculoskeletal: Negative for back pain.  Skin: Negative for rash.  Neurological: Negative for dizziness, light-headedness and headaches.     Physical Exam Triage Vital Signs ED Triage  Vitals  Enc Vitals Group     BP 01/28/19 1448 139/75     Pulse Rate 01/28/19 1448 71     Resp 01/28/19 1448 16     Temp 01/28/19 1448 98.3 F (36.8 C)     Temp Source 01/28/19 1448 Oral     SpO2 01/28/19 1448 99 %     Weight --      Height --      Head Circumference --      Peak Flow --      Pain Score 01/28/19 1446 10     Pain Loc --      Pain Edu? --      Excl. in [place]? --    No data found.  Updated Vital Signs BP 139/75 (BP Location: Right Arm)   Pulse 71   Temp 98.3 F (36.8 C) (Oral)   Resp 16   LMP 01/26/2019   SpO2 99%   Visual Acuity Right Eye Distance:   Left Eye Distance:   Bilateral Distance:    Right Eye Near:   Left Eye Near:    Bilateral Near:     Physical Exam Vitals signs and nursing note reviewed.  Constitutional:      General: She is not in acute distress.    Appearance: She is well-developed.     Comments: Pleasant  HENT:     Head: Normocephalic and atraumatic.  Eyes:     Conjunctiva/sclera: Conjunctivae normal.  Neck:     Musculoskeletal: Neck supple.  Cardiovascular:     Rate and Rhythm: Normal rate and regular rhythm.     Heart sounds: No murmur.  Pulmonary:     Effort: Pulmonary effort is normal. No respiratory distress.     Breath sounds: Normal breath sounds.     Comments: Breathing comfortably at rest, CTABL, no wheezing, rales or other adventitious sounds auscultated Abdominal:     Palpations: Abdomen is soft.     Tenderness: There is abdominal tenderness.     Comments: Abdomen soft, nondistended, generalized tenderness throughout entire abdomen, more focal to lower abdomen, but does not localize to either quadrant.  Negative rebound, negative Rovsing, negative McBurney's, negative Murphy's  Genitourinary:    Comments: Normal external female genitalia, large amount of bright red blood present in vagina, cervix appears pink, no erythema noted, mild tenderness with bimanual, the majority of pain comes from palpation over her abdomen  and suprapubic area Skin:    General: Skin is warm and dry.  Neurological:  Mental Status: She is alert.      UC Treatments / Results  Labs (all labs ordered are listed, but only abnormal results are displayed) Labs Reviewed  POCT URINALYSIS DIP (DEVICE) - Abnormal; Notable for the following components:      Result Value   Hgb urine dipstick MODERATE (*)    All other components within normal limits  URINE CULTURE  POCT PREGNANCY, URINE  POC URINE PREG, ED  CERVICOVAGINAL ANCILLARY ONLY    EKG   Radiology No results found.  Procedures Procedures (including critical care time)  Medications Ordered in UC Medications  ibuprofen (ADVIL) tablet 800 mg (800 mg Oral Given 01/28/19 1659)  ibuprofen (ADVIL) 800 MG tablet (has no administration in time range)    Initial Impression / Assessment and Plan / UC Course  I have reviewed the triage vital signs and the nursing notes.  Pertinent labs & imaging results that were available during my care of the patient were reviewed by me and considered in my medical decision making (see chart for details).     Negative leuks and nitrites on UA, recent course of Bactrim, dysuria less likely related to UTI, but will send for culture to confirm.  Obtained vaginal swab to send off to check for any vaginal infection causes of increased cramping/bleeding during cycle.  Will defer any empiric treatment at this time given patient's low suspicion of any STDs.  Will also check for BV as possible cause of urinary symptoms.  Patient does have history of fibroids, pain and heavy bleeding may be related to this.  Providing Naprosyn and Zofran to use as needed and treat symptomatically.  Do not suspect abdominal emergency at this time.  Did recommend to follow-up with OB/GYN if symptoms persisting.  If symptoms worsening to follow-up in emergency room.  Discussed strict return precautions. Patient verbalized understanding and is agreeable with plan.   Final Clinical Impressions(s) / UC Diagnoses   Final diagnoses:  Lower abdominal pain     Discharge Instructions     Naprosyn twice daily for pain Zofran for nausea Swab pending to check for any causes contributing to pain Follow up if symptoms worsening or not improving   ED Prescriptions    Medication Sig Dispense Auth. Provider   naproxen (NAPROSYN) 500 MG tablet Take 1 tablet (500 mg total) by mouth 2 (two) times daily. 30 tablet ,  C, PA-C   ondansetron (ZOFRAN ODT) 4 MG disintegrating tablet Take 1 tablet (4 mg total) by mouth every 8 (eight) hours as needed for nausea or vomiting. 20 tablet , [place] C, PA-C     PDMP not reviewed this encounter.   Janith Lima, [place] 01/29/19 [phone]

## 2019-01-31 LAB — CERVICOVAGINAL ANCILLARY ONLY
Bacterial vaginitis: POSITIVE — AB
Candida vaginitis: POSITIVE — AB
Chlamydia: NEGATIVE
Neisseria Gonorrhea: NEGATIVE
Trichomonas: NEGATIVE

## 2019-02-01 ENCOUNTER — Telehealth: Payer: Self-pay | Admitting: Emergency Medicine

## 2019-02-01 MED ORDER — METRONIDAZOLE 500 MG PO TABS: 500.0000 mg | ORAL_TABLET | Freq: Two times a day (BID) | ORAL | 0 refills | Status: AC

## 2019-02-01 MED ORDER — FLUCONAZOLE 150 MG PO TABS: 150.0000 mg | ORAL_TABLET | Freq: Once | ORAL | 0 refills | Status: AC

## 2019-02-01 NOTE — Progress Notes (Signed)
Patient ID: Sherri [name], female   DOB: [birth year], 33 y.o.   MRN: [id number]  Virtual Visit via Telephone Note  I connected with Armen Pickup Manon on 02/02/19 at  9:50 AM EST by telephone and verified that I am speaking with the correct person using two identifiers.   I discussed the limitations, risks, security and privacy concerns of performing an evaluation and management service by telephone and the availability of in person appointments. I also discussed with the patient that there may be a patient responsible charge related to this service. The patient expressed understanding and agreed to proceed.  Patient location:  home My Location:  [place] office Persons on the call:  Me and the patient     History of Present Illness: Patient has not picked up Rx for metronidazole and diflucan that was just sent.  Sometimes the metronidazole makes her feel sick on her stomach.  She is wondering why she keeps getting UTI and BV/yest.  Glucose was 94 12/23/2018 so not likely diabetic.  She is also concerned about HIV.  She had a negative HIV test 12/23/2018.  She just recently learned that her partner of 2 years is HIV +.  He only told her this a few days ago.  It is unclear how long he had known.  They do not uses condoms.  The patient denies any symptoms of illness.    After being seen in the ED 11/28 for abdominal cramping.    From ED A/P: Negative leuks and nitrites on UA, recent course of Bactrim, dysuria less likely related to UTI, but will send for culture to confirm.  Obtained vaginal swab to send off to check for any vaginal infection causes of increased cramping/bleeding during cycle.  Will defer any empiric treatment at this time given patient's low suspicion of any STDs.  Will also check for BV as possible cause of urinary symptoms.  Patient does have history of fibroids, pain and heavy bleeding may be related to this.  Providing Naprosyn and Zofran to use as needed and treat  symptomatically.  Do not suspect abdominal emergency at this time.  Did recommend to follow-up with OB/GYN if symptoms persisting.  If symptoms worsening to follow-up in emergency room.   Observations/Objective:  NAD.  A&Ox3.  J/I questionable   Assessment and Plan: 1. Nausea Can try with metronidazole - ondansetron (ZOFRAN) 8 MG tablet; Take 1 tablet (8 mg total) by mouth every 8 (eight) hours as needed for nausea or vomiting.  Dispense: 30 tablet; Refill: 0  2. Exposure to HIV counseled at length on condoms and ways to reduce risk of transmission. Advised HIV testing every 3-6 months given partner stating he is positive.    3. BV (bacterial vaginosis)-this is recurrent.  Patient would like to see gyn.   Pick up metronidazole and diflucan - Ambulatory referral to Gynecology    Follow Up Instructions: See PCP in 2 months;  Sooner if needed   I discussed the assessment and treatment plan with the patient. The patient was provided an opportunity to ask questions and all were answered. The patient agreed with the plan and demonstrated an understanding of the instructions.   The patient was advised to call back or seek an in-person evaluation if the symptoms worsen or if the condition fails to improve as anticipated.  I provided 16 minutes of non-face-to-face time during this encounter.   Freeman Caldron, PA-C

## 2019-02-01 NOTE — Telephone Encounter (Signed)
Bacterial vaginosis is positive. This was not treated at the urgent care visit.  Flagyl 500 mg BID x 7 days #14 no refills sent to patients pharmacy of choice.    Test for candida (yeast) was positive.  Prescription for fluconazole 150mg  po now, repeat dose in 3d if needed, #2 no refills, sent to the pharmacy of record.  Recheck or followup with PCP for further evaluation if symptoms are not improving.    Patient contacted by phone and made aware of    results. Pt verbalized understanding and had all questions answered.

## 2019-02-02 ENCOUNTER — Other Ambulatory Visit: Payer: Self-pay

## 2019-02-02 ENCOUNTER — Ambulatory Visit: Payer: Self-pay | Attending: Family Medicine | Admitting: Physician Assistant

## 2019-02-02 DIAGNOSIS — Z206 Contact with and (suspected) exposure to human immunodeficiency virus [HIV]: Secondary | ICD-10-CM | POA: Insufficient documentation

## 2019-02-02 DIAGNOSIS — N760 Acute vaginitis: Secondary | ICD-10-CM

## 2019-02-02 DIAGNOSIS — R110 Nausea: Secondary | ICD-10-CM

## 2019-02-02 DIAGNOSIS — B9689 Other specified bacterial agents as the cause of diseases classified elsewhere: Secondary | ICD-10-CM

## 2019-02-02 MED ORDER — ONDANSETRON HCL 8 MG PO TABS: 8.0000 mg | ORAL_TABLET | Freq: Three times a day (TID) | ORAL | 0 refills | Status: DC | PRN

## 2019-02-11 ENCOUNTER — Other Ambulatory Visit: Payer: Self-pay

## 2019-02-11 ENCOUNTER — Encounter: Payer: Self-pay | Admitting: Emergency Medicine

## 2019-02-11 ENCOUNTER — Ambulatory Visit
Admission: EM | Admit: 2019-02-11 | Discharge: 2019-02-11 | Disposition: A | Discharge status: Home / Self Care | Payer: Self-pay | Attending: Physician Assistant | Admitting: Physician Assistant

## 2019-02-11 DIAGNOSIS — B9689 Other specified bacterial agents as the cause of diseases classified elsewhere: Secondary | ICD-10-CM

## 2019-02-11 DIAGNOSIS — Z20828 Contact with and (suspected) exposure to other viral communicable diseases: Secondary | ICD-10-CM

## 2019-02-11 DIAGNOSIS — R52 Pain, unspecified: Secondary | ICD-10-CM | POA: Insufficient documentation

## 2019-02-11 DIAGNOSIS — R519 Headache, unspecified: Secondary | ICD-10-CM | POA: Insufficient documentation

## 2019-02-11 DIAGNOSIS — N3090 Cystitis, unspecified without hematuria: Secondary | ICD-10-CM | POA: Insufficient documentation

## 2019-02-11 LAB — POCT URINALYSIS DIP (MANUAL ENTRY)
Bilirubin, UA: NEGATIVE
Glucose, UA: NEGATIVE mg/dL
Ketones, POC UA: NEGATIVE mg/dL
Leukocytes, UA: NEGATIVE
Nitrite, UA: POSITIVE — AB
Protein Ur, POC: NEGATIVE mg/dL
Spec Grav, UA: >=1.03 — AB (ref 1.010–1.025)
Urobilinogen, UA: 0.2 E.U./dL
pH, UA: 7 (ref 5.0–8.0)

## 2019-02-11 LAB — POCT URINE PREGNANCY: Preg Test, Ur: NEGATIVE

## 2019-02-11 MED ORDER — FLUCONAZOLE 150 MG PO TABS: 150.0000 mg | ORAL_TABLET | Freq: Every day | ORAL | 0 refills | Status: DC

## 2019-02-11 MED ORDER — CEPHALEXIN 500 MG PO CAPS: 500.0000 mg | ORAL_CAPSULE | Freq: Two times a day (BID) | ORAL | 0 refills | Status: DC

## 2019-02-11 MED ORDER — FLUTICASONE PROPIONATE 50 MCG/ACT NA SUSP: 2.0000 | Freq: Every day | NASAL | 0 refills | Status: DC

## 2019-02-11 NOTE — ED Triage Notes (Signed)
Adds that she has a severe headache and would like COVID testing

## 2019-02-11 NOTE — ED Triage Notes (Signed)
Reports bilateral back pain, urinary pain, urinary frequency, and fatigue for 3 days.

## 2019-02-11 NOTE — Discharge Instructions (Addendum)
COVID testing ordered. I would like you to quarantine until testing results. You can take over the counter flonase/nasacort to help with nasal congestion/drainage. If experiencing shortness of breath, trouble breathing, go to the emergency department for further evaluation needed.   Your urine was positive for an urinary tract infection. Start keflex as directed. Keep hydrated, urine should be clear to pale yellow in color. Monitor for any worsening of symptoms, fever, worsening abdominal pain, nausea/vomiting, flank pain, follow up for reevaluation.

## 2019-02-11 NOTE — ED Provider Notes (Signed)
[facility]    CSN: [id number] Arrival date & time: 02/11/19  1114      History   Chief Complaint Chief Complaint  Patient presents with  . Urinary Tract Infection    HPI Sherri [name] is a 33 y.o. female.   33 year old female comes in for 3 day history of headache, back pain, urinary symptoms.  Denies cough, congestion, sore throat.  Has had bilateral ear pain.  Denies fever, chills.  Has had body aches.  Denies abdominal pain, nausea, vomiting, diarrhea.  Denies shortness of breath, loss of taste or smell.  Has had dysuria, urinary frequency without hematuria.  LMP 01/30/2019.  Patient having some vaginal discharge, itching.  She has recurrent BV and yeast infections that does not seem to be controlled by Flagyl, Diflucan.  She has an appointment to follow-up with specialist for further evaluation of this.     Past Medical History:  Diagnosis Date  . Fibromyalgia    denies today  . Genital herpes   . Hypertension    gestational  . 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 Urinary tract infection   . Uterine fibroid     Patient Active Problem List   Diagnosis Date Noted  . Exposure to HIV 02/02/2019  . Hypertension   . Genital herpes   . Yeast infection 12/03/2016  . Status post cesarean delivery 06/06/2014  . Uterine fibroid 12/25/2013  . Obesity 12/25/2013    Past Surgical History:  Procedure Laterality Date  . CESAREAN SECTION N/A 06/05/2014   Procedure: CESAREAN SECTION;  Surgeon: Truett Mainland, DO;  Location: [facility];  Service: Obstetrics;  Laterality: N/A;  . WISDOM TOOTH EXTRACTION      OB History    Gravida  1   Para  1   Term      Preterm  1   AB      Living  2     SAB      TAB      Ectopic      Multiple  1   Live Births  2            Home Medications    Prior to Admission medications   Medication Sig Start Date End Date Taking? Authorizing Provider  cephALEXin (KEFLEX) 500 MG  capsule Take 1 capsule (500 mg total) by mouth 2 (two) times daily. 02/11/19   Tasia Catchings,  V, PA-C  fluconazole (DIFLUCAN) 150 MG tablet Take 1 tablet (150 mg total) by mouth daily. Take second dose 72 hours later if symptoms still persists. 02/11/19   Tasia Catchings,  V, PA-C  fluticasone (FLONASE) 50 MCG/ACT nasal spray Place 2 sprays into both nostrils daily. 02/11/19   Tasia Catchings,  V, PA-C  naproxen (NAPROSYN) 500 MG tablet Take 1 tablet (500 mg total) by mouth 2 (two) times daily. 01/28/19   Wieters, Hallie C, PA-C  esomeprazole (NEXIUM) 40 MG capsule Take 1 capsule (40 mg total) by mouth daily. To reduce stomach acid Patient not taking: Reported on 12/23/2018 03/07/18 12/30/18  Antony Blackbird, MD    Family History Family History  Problem Relation Age of Onset  . Hypertension Mother   . Hypertension Father   . Breast cancer Maternal Grandmother        58  . Colon cancer Neg Hx   . Colon polyps Neg Hx   . Kidney disease Neg Hx   . Diabetes Neg Hx   . Esophageal cancer  Neg Hx   . Gallbladder disease Neg Hx   . Heart disease Neg Hx   . Asthma Neg Hx   . Cancer Neg Hx   . Stroke Neg Hx     Social History Social History   Tobacco Use  . Smoking status: Never Smoker  . Smokeless tobacco: Never Used  Substance Use Topics  . Alcohol use: No  . Drug use: No     Allergies   Macrobid [nitrofurantoin]   Review of Systems Review of Systems  Reason unable to perform ROS: See HPI as above.     Physical Exam Triage Vital Signs ED Triage Vitals  Enc Vitals Group     BP 02/11/19 1128 128/82     Pulse Rate 02/11/19 1128 75     Resp 02/11/19 1128 16     Temp 02/11/19 1128 99.4 F (37.4 C)     Temp Source 02/11/19 1128 Oral     SpO2 02/11/19 1128 97 %     Weight --      Height --      Head Circumference --      Peak Flow --      Pain Score 02/11/19 1126 9     Pain Loc --      Pain Edu? --      Excl. in [place]? --    No data found.  Updated Vital Signs BP 128/82   Pulse 75   Temp 99.4  F (37.4 C) (Oral)   Resp 16   LMP 01/30/2019   SpO2 97%   Physical Exam Constitutional:      General: She is not in acute distress.    Appearance: She is well-developed. She is not ill-appearing, toxic-appearing or diaphoretic.  HENT:     Head: Normocephalic and atraumatic.     Right Ear: Tympanic membrane, ear canal and external ear normal. Tympanic membrane is not erythematous or bulging.     Left Ear: Tympanic membrane, ear canal and external ear normal. Tympanic membrane is not erythematous or bulging.  Eyes:     Conjunctiva/sclera: Conjunctivae normal.     Pupils: Pupils are equal, round, and reactive to light.  Cardiovascular:     Rate and Rhythm: Normal rate and regular rhythm.     Heart sounds: Normal heart sounds. No murmur. No friction rub. No gallop.   Pulmonary:     Effort: Pulmonary effort is normal.     Breath sounds: Normal breath sounds. No wheezing or rales.  Abdominal:     General: Bowel sounds are normal.     Palpations: Abdomen is soft.     Tenderness: There is no abdominal tenderness. There is no right CVA tenderness, left CVA tenderness, guarding or rebound.  Skin:    General: Skin is warm and dry.  Neurological:     Mental Status: She is alert and oriented to person, place, and time.  Psychiatric:        Behavior: Behavior normal.        Judgment: Judgment normal.      UC Treatments / Results  Labs (all labs ordered are listed, but only abnormal results are displayed) Labs Reviewed  POCT URINALYSIS DIP (MANUAL ENTRY) - Abnormal; Notable for the following components:      Result Value   Spec Grav, UA >=1.030 (*)    Blood, UA trace-intact (*)    Nitrite, UA Positive (*)    All other components within normal limits  POCT URINE PREGNANCY -  Normal  URINE CULTURE  NOVEL CORONAVIRUS, NAA    EKG   Radiology No results found.  Procedures Procedures (including critical care time)  Medications Ordered in UC Medications - No data to display   Initial Impression / Assessment and Plan / UC Course  I have reviewed the triage vital signs and the nursing notes.  Pertinent labs & imaging results that were available during my care of the patient were reviewed by me and considered in my medical decision making (see chart for details).    1. Headache/body aches Patient would like testing for COVID. Although no URI symptoms, seems reasonable to rule out COVID. Order sent, patient to quarantine until testing results return.  Symptomatic treatment discussed.  Return precautions given.  2.  Cystitis Urine dipstick positive for UTI. Start antibiotics as directed.  Diflucan for yeast infection.  Recently finished course of Flagyl without much relief of discharge.  Has follow-up with specialist for further evaluation, will defer repeat Flagyl at this time.  Has not been sexually active since last cytology, at the time negative for GC, trich.  Push fluids. Return precautions given.  Final Clinical Impressions(s) / UC Diagnoses   Final diagnoses:  Acute intractable headache, unspecified headache type  Generalized body aches  Cystitis    ED Prescriptions    Medication Sig Dispense Auth. Provider   cephALEXin (KEFLEX) 500 MG capsule Take 1 capsule (500 mg total) by mouth 2 (two) times daily. 10 capsule ,  V, PA-C   fluconazole (DIFLUCAN) 150 MG tablet Take 1 tablet (150 mg total) by mouth daily. Take second dose 72 hours later if symptoms still persists. 2 tablet ,  V, PA-C   fluticasone (FLONASE) 50 MCG/ACT nasal spray Place 2 sprays into both nostrils daily. 1 g Ok Edwards, PA-C     PDMP not reviewed this encounter.   Ok Edwards, PA-C 02/11/19 1159

## 2019-02-12 LAB — NOVEL CORONAVIRUS, NAA: SARS-CoV-2, NAA: NOT DETECTED

## 2019-02-14 ENCOUNTER — Encounter (HOSPITAL_COMMUNITY): Payer: Self-pay

## 2019-02-14 ENCOUNTER — Other Ambulatory Visit: Payer: Self-pay

## 2019-02-14 ENCOUNTER — Ambulatory Visit (HOSPITAL_COMMUNITY)
Admission: EM | Admit: 2019-02-14 | Discharge: 2019-02-14 | Disposition: A | Discharge status: Home / Self Care | Payer: Self-pay | Attending: Family Medicine | Admitting: Family Medicine

## 2019-02-14 DIAGNOSIS — R300 Dysuria: Secondary | ICD-10-CM | POA: Insufficient documentation

## 2019-02-14 LAB — POCT URINALYSIS DIP (DEVICE)
Bilirubin Urine: NEGATIVE
Glucose, UA: NEGATIVE mg/dL
Ketones, ur: NEGATIVE mg/dL
Leukocytes,Ua: NEGATIVE
Nitrite: NEGATIVE
Protein, ur: NEGATIVE mg/dL
Specific Gravity, Urine: 1.02 (ref 1.005–1.030)
Urobilinogen, UA: 0.2 mg/dL (ref 0.0–1.0)
pH: 7 (ref 5.0–8.0)

## 2019-02-14 MED ORDER — SULFAMETHOXAZOLE-TRIMETHOPRIM 800-160 MG PO TABS: 1.0000 | ORAL_TABLET | Freq: Two times a day (BID) | ORAL | 0 refills | Status: AC

## 2019-02-14 MED ORDER — CEPHALEXIN 500 MG PO CAPS: 500.0000 mg | ORAL_CAPSULE | Freq: Two times a day (BID) | ORAL | 0 refills | Status: DC

## 2019-02-14 NOTE — ED Provider Notes (Signed)
[place]    ASSESSMENT & PLAN:  1. Dysuria     Given symptoms will treat empirically for UTI.  Begin: Meds ordered this encounter  Medications  . sulfamethoxazole-trimethoprim (BACTRIM DS) 800-160 MG tablet    Sig: Take 1 tablet by mouth 2 (two) times daily for 5 days.    Dispense:  10 tablet    Refill:  0    No signs of pyelonephritis. Discussed. Urine culture sent along with vaginal cytology. Will notify patient when results available. Will follow up with her PCP or here if not showing improvement over the next 48 hours, sooner if needed.  Outlined signs and symptoms indicating need for more acute intervention. Patient verbalized understanding. After Visit Summary given.  SUBJECTIVE: Tested negative for COVID on 02/11/2019.  Sherri [name] is a 33 y.o. female who complains of urinary frequency, urgency and dysuria for the past several days. Without associated flank pain, fever, chills, vaginal discharge or bleeding. Gross hematuria: not present. No specific aggravating or alleviating factors reported. No LE edema. Normal PO intake without n/v/d. Without specific abdominal pain. Ambulatory without difficulty. Also reports fatigue over the past few days. OTC treatment: none reported. H/O UTI: occasional. Requests STI testing but reports no sexual activity for the past three months. No pelvic pain reported.  LMP: Patient's last menstrual period was 01/30/2019.  ROS: As in HPI. All other systems negative.   OBJECTIVE:  Vitals:   02/14/19 1147 02/14/19 1148  BP:  135/79  Pulse:  82  Resp:  19  Temp:  99.2 F (37.3 C)  TempSrc:  Oral  SpO2:  100%  Weight: 87.1 kg    General appearance: alert; no distress HENT: oropharynx: moist Lungs: unlabored respirations Abdomen: soft, non-tender; no guarding or rebound tenderness Back: no CVA tenderness Extremities: no edema; symmetrical with no gross deformities Skin: warm and dry Neurologic: normal gait  Psychological: alert and cooperative; normal mood and affect  Labs Reviewed  URINE CULTURE  CERVICOVAGINAL ANCILLARY ONLY    Allergies  Allergen Reactions  . Macrobid [Nitrofurantoin] Nausea And Vomiting    Past Medical History:  Diagnosis Date  . Fibromyalgia    denies today  . Genital herpes   . Hypertension    gestational  . 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 Urinary tract infection   . Uterine fibroid    Social History   Socioeconomic History  . Marital status: Single    Spouse name: Not on file  . Number of children: Not on file  . Years of education: Not on file  . Highest education level: Not on file  Occupational History  . Occupation: [employer]  Tobacco Use  . Smoking status: Never Smoker  . Smokeless tobacco: Never Used  Substance and Sexual Activity  . Alcohol use: No  . Drug use: No  . Sexual activity: Yes    Birth control/protection: None  Other Topics Concern  . Not on file  Social History Narrative  . Not on file   Social Determinants of Health   Financial Resource Strain:   . Difficulty of Paying Living Expenses: Not on file  Food Insecurity:   . Worried About Charity fundraiser in the Last Year: Not on file  . Ran Out of Food in the Last Year: Not on file  Transportation Needs:   . Lack of Transportation (Medical): Not on file  . Lack of Transportation (Non-Medical): Not on file  Physical Activity:   .  Days of Exercise per Week: Not on file  . Minutes of Exercise per Session: Not on file  Stress:   . Feeling of Stress : Not on file  Social Connections:   . Frequency of Communication with Friends and Family: Not on file  . Frequency of Social Gatherings with Friends and Family: Not on file  . Attends Religious Services: Not on file  . Active Member of Clubs or Organizations: Not on file  . Attends Archivist Meetings: Not on file  . Marital Status: Not on file  Intimate Partner Violence:   .  Fear of Current or Ex-Partner: Not on file  . Emotionally Abused: Not on file  . Physically Abused: Not on file  . Sexually Abused: Not on file   Family History  Problem Relation Age of Onset  . Hypertension Mother   . Hypertension Father   . Breast cancer Maternal Grandmother        68  . Colon cancer Neg Hx   . Colon polyps Neg Hx   . Kidney disease Neg Hx   . Diabetes Neg Hx   . Esophageal cancer Neg Hx   . Gallbladder disease Neg Hx   . Heart disease Neg Hx   . Asthma Neg Hx   . Cancer Neg Hx   . Stroke Neg Hx        Vanessa Kick, MD 02/14/19 1236

## 2019-02-14 NOTE — Discharge Instructions (Addendum)
We have sent testing for sexually transmitted infections. We will notify you of any positive results once they are received. If required, we will prescribe any medications you might need.  Please refrain from all sexual activity for at least the next seven days.  

## 2019-02-14 NOTE — ED Triage Notes (Signed)
Pt. Is here stating she has had body aches,fatigue & urinary symptoms, her symptoms include frequency to urinate, discharge, odor, burning.

## 2019-02-15 LAB — URINE CULTURE: Culture: NO GROWTH

## 2019-02-16 ENCOUNTER — Telehealth: Payer: Self-pay | Admitting: Emergency Medicine

## 2019-02-16 LAB — CERVICOVAGINAL ANCILLARY ONLY
Bacterial vaginitis: POSITIVE — AB
Candida vaginitis: POSITIVE — AB
Chlamydia: NEGATIVE
Neisseria Gonorrhea: NEGATIVE
Trichomonas: NEGATIVE

## 2019-02-16 LAB — URINE CULTURE: Culture: >=100000 — AB

## 2019-02-16 MED ORDER — METRONIDAZOLE 0.75 % VA GEL: 1.0000 | Freq: Every day | VAGINAL | 0 refills | Status: AC

## 2019-02-16 MED ORDER — FLUCONAZOLE 150 MG PO TABS: 150.0000 mg | ORAL_TABLET | Freq: Once | ORAL | 0 refills | Status: AC

## 2019-02-16 NOTE — Telephone Encounter (Signed)
Bacterial vaginosis is positive. This was not treated at the urgent care visit.  Flagyl 500 mg BID x 7 days #14 no refills sent to patients pharmacy of choice.    Test for candida (yeast) was positive.  Prescription for fluconazole 150mg  po now, repeat dose in 3d if needed, #2 no refills, sent to the pharmacy of record.  Recheck or followup with PCP for further evaluation if symptoms are not improving.    Patient contacted by phone and made aware of    results. Pt verbalized understanding and had all questions answered.

## 2019-02-28 ENCOUNTER — Encounter: Payer: Self-pay | Admitting: Emergency Medicine

## 2019-02-28 ENCOUNTER — Ambulatory Visit
Admission: EM | Admit: 2019-02-28 | Discharge: 2019-02-28 | Disposition: A | Discharge status: Home / Self Care | Payer: Self-pay

## 2019-02-28 ENCOUNTER — Ambulatory Visit (HOSPITAL_COMMUNITY)
Admission: EM | Admit: 2019-02-28 | Discharge: 2019-02-28 | Disposition: A | Discharge status: Home / Self Care | Payer: Self-pay

## 2019-02-28 ENCOUNTER — Other Ambulatory Visit: Payer: Self-pay

## 2019-02-28 DIAGNOSIS — R300 Dysuria: Secondary | ICD-10-CM

## 2019-02-28 DIAGNOSIS — R319 Hematuria, unspecified: Secondary | ICD-10-CM

## 2019-02-28 LAB — POCT URINALYSIS DIP (MANUAL ENTRY)
Bilirubin, UA: NEGATIVE
Glucose, UA: NEGATIVE mg/dL
Leukocytes, UA: NEGATIVE
Nitrite, UA: NEGATIVE
Protein Ur, POC: NEGATIVE mg/dL
Spec Grav, UA: >=1.03 — AB (ref 1.010–1.025)
Urobilinogen, UA: 0.2 E.U./dL
pH, UA: 6 (ref 5.0–8.0)

## 2019-02-28 LAB — POCT URINE PREGNANCY: Preg Test, Ur: NEGATIVE

## 2019-02-28 MED ORDER — KETOROLAC TROMETHAMINE 10 MG PO TABS: 10.0000 mg | ORAL_TABLET | Freq: Four times a day (QID) | ORAL | 0 refills | Status: AC | PRN

## 2019-02-28 NOTE — ED Notes (Signed)
Patient left per [place], patient access.

## 2019-02-28 NOTE — ED Notes (Signed)
Patient called x 3 with no response.  Patient Screener states patient left.

## 2019-02-28 NOTE — ED Triage Notes (Signed)
Pt presents to [facility] for assessment of burning with urination and urinary frequency x 3 weeks.  Pt c/o back pain, generalized malaise, and chills with symptoms.

## 2019-02-28 NOTE — Discharge Instructions (Addendum)
Important to drink plenty of water. May take Toradol as needed for pain: Do not take anything else for pain while taking this medication. Very important to follow-up with urology as you likely need further evaluation for cause of your symptoms, blood in urine. Go to ER for worsening symptoms, abdominal or back pain, fever.

## 2019-02-28 NOTE — ED Provider Notes (Signed)
[facility]    CSN: [id number] Arrival date & time: 02/28/19  1207      History   Chief Complaint Chief Complaint  Patient presents with  . APPT: 1230  . Dysuria    HPI Sherri [name] is a 33 y.o. female presenting for 3-week course of burning with urination, urinary frequency.  Patient also endorsing low back pain, generalized malaise, chills for the last few days.  Denies known sick contacts.  Patient initially seen for UTI symptoms on 02/14/2019: Given Bactrim for empiric coverage of UTI.  Culture was negative.  States Bactrim did not help.  Past Medical History:  Diagnosis Date  . Fibromyalgia    denies today  . Genital herpes   . Hypertension    gestational  . 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 Urinary tract infection   . Uterine fibroid     Patient Active Problem List   Diagnosis Date Noted  . Exposure to HIV 02/02/2019  . Hypertension   . Genital herpes   . Yeast infection 12/03/2016  . Status post cesarean delivery 06/06/2014  . Uterine fibroid 12/25/2013  . Obesity 12/25/2013    Past Surgical History:  Procedure Laterality Date  . CESAREAN SECTION N/A 06/05/2014   Procedure: CESAREAN SECTION;  Surgeon: Truett Mainland, DO;  Location: [facility];  Service: Obstetrics;  Laterality: N/A;  . WISDOM TOOTH EXTRACTION      OB History    Gravida  1   Para  1   Term      Preterm  1   AB      Living  2     SAB      TAB      Ectopic      Multiple  1   Live Births  2            Home Medications    Prior to Admission medications   Medication Sig Start Date End Date Taking? Authorizing Provider  ondansetron (ZOFRAN) 4 MG tablet Take 4 mg by mouth every 8 (eight) hours as needed for nausea or vomiting.   Yes [provider]  ketorolac (TORADOL) 10 MG tablet Take 1 tablet (10 mg total) by mouth every 6 (six) hours as needed for up to 3 days. 02/28/19 03/03/19  Hall-Potvin, [place], PA-C    esomeprazole (NEXIUM) 40 MG capsule Take 1 capsule (40 mg total) by mouth daily. To reduce stomach acid Patient not taking: Reported on 12/23/2018 03/07/18 12/30/18  Fulp, Cammie, MD  fluticasone (FLONASE) 50 MCG/ACT nasal spray Place 2 sprays into both nostrils daily. 02/11/19 02/14/19  Ok Edwards, PA-C    Family History Family History  Problem Relation Age of Onset  . Hypertension Mother   . Hypertension Father   . Breast cancer Maternal Grandmother        91  . Colon cancer Neg Hx   . Colon polyps Neg Hx   . Kidney disease Neg Hx   . Diabetes Neg Hx   . Esophageal cancer Neg Hx   . Gallbladder disease Neg Hx   . Heart disease Neg Hx   . Asthma Neg Hx   . Cancer Neg Hx   . Stroke Neg Hx     Social History Social History   Tobacco Use  . Smoking status: Never Smoker  . Smokeless tobacco: Never Used  Substance Use Topics  . Alcohol use: No  . Drug use: No  Allergies   Macrobid [nitrofurantoin]   Review of Systems Review of Systems  Constitutional: Negative for fatigue and fever.  Respiratory: Negative for cough and shortness of breath.   Cardiovascular: Negative for chest pain and palpitations.  Gastrointestinal: Negative for constipation and diarrhea.  Genitourinary: Positive for dysuria and frequency. Negative for flank pain, genital sores, hematuria, pelvic pain, urgency, vaginal bleeding, vaginal discharge and vaginal pain.  Musculoskeletal: Positive for back pain. Negative for gait problem, neck pain and neck stiffness.     Physical Exam Triage Vital Signs ED Triage Vitals  Enc Vitals Group     BP 02/28/19 1320 119/76     Pulse Rate 02/28/19 1320 77     Resp 02/28/19 1320 16     Temp 02/28/19 1320 97.6 F (36.4 C)     Temp Source 02/28/19 1320 Temporal     SpO2 02/28/19 1320 98 %     Weight --      Height --      Head Circumference --      Peak Flow --      Pain Score 02/28/19 1318 8     Pain Loc --      Pain Edu? --      Excl. in [place]? --     No data found.  Updated Vital Signs BP 119/76 (BP Location: Left Arm)   Pulse 77   Temp 97.6 F (36.4 C) (Temporal)   Resp 16   LMP 01/23/2019   SpO2 98%   Visual Acuity Right Eye Distance:   Left Eye Distance:   Bilateral Distance:    Right Eye Near:   Left Eye Near:    Bilateral Near:     Physical Exam Constitutional:      General: She is not in acute distress.    Appearance: She is not ill-appearing.  HENT:     Head: Normocephalic and atraumatic.     Mouth/Throat:     Mouth: Mucous membranes are moist.     Pharynx: Oropharynx is clear.  Eyes:     General: No scleral icterus.    Conjunctiva/sclera: Conjunctivae normal.     Pupils: Pupils are equal, round, and reactive to light.  Cardiovascular:     Rate and Rhythm: Normal rate.  Pulmonary:     Effort: Pulmonary effort is normal. No respiratory distress.     Breath sounds: No wheezing.  Abdominal:     General: Bowel sounds are normal. There is no distension.     Palpations: Abdomen is soft.     Tenderness: There is no abdominal tenderness. There is no right CVA tenderness, left CVA tenderness or guarding.  Musculoskeletal:        General: Normal range of motion.     Right lower leg: No edema.     Left lower leg: No edema.  Skin:    Coloration: Skin is not jaundiced or pale.  Neurological:     Mental Status: She is alert and oriented to person, place, and time.      UC Treatments / Results  Labs (all labs ordered are listed, but only abnormal results are displayed) Labs Reviewed  POCT URINALYSIS DIP (MANUAL ENTRY) - Abnormal; Notable for the following components:      Result Value   Ketones, POC UA trace (5) (*)    Spec Grav, UA >=1.030 (*)    Blood, UA trace-intact (*)    All other components within normal limits  POCT URINE PREGNANCY  EKG   Radiology No results found.  Procedures Procedures (including critical care time)  Medications Ordered in UC Medications - No data to  display  Initial Impression / Assessment and Plan / UC Course  I have reviewed the triage vital signs and the nursing notes.  Pertinent labs & imaging results that were available during my care of the patient were reviewed by me and considered in my medical decision making (see chart for details).     Patient afebrile, nontoxic.  Dipstick done in office, reviewed by me showing trace intact blood, specific gravity 1.030, trace ketones.  Reviewed findings with patient who verbalized understanding.  Denies history of renal calculi, though this cannot be excluded.  Patient to follow-up with PCP, possibly urology, increased water intake, and take Toradol short-term as needed for pain.  Provided urine strainer.  Return precautions discussed, patient verbalized understanding and is agreeable to plan. Final Clinical Impressions(s) / UC Diagnoses   Final diagnoses:  Dysuria  Hematuria, unspecified type     Discharge Instructions     Important to drink plenty of water. May take Toradol as needed for pain: Do not take anything else for pain while taking this medication. Very important to follow-up with urology as you likely need further evaluation for cause of your symptoms, blood in urine. Go to ER for worsening symptoms, abdominal or back pain, fever.    ED Prescriptions    Medication Sig Dispense Auth. Provider   ketorolac (TORADOL) 10 MG tablet Take 1 tablet (10 mg total) by mouth every 6 (six) hours as needed for up to 3 days. 12 tablet Hall-Potvin, [place], PA-C     PDMP not reviewed this encounter.   Hall-Potvin, [place], [place] 03/01/19 1203

## 2019-02-28 NOTE — ED Notes (Signed)
Updated on delays for a room

## 2019-03-08 ENCOUNTER — Other Ambulatory Visit: Payer: Self-pay

## 2019-03-08 ENCOUNTER — Encounter: Payer: Self-pay | Admitting: Urology

## 2019-03-08 ENCOUNTER — Ambulatory Visit (INDEPENDENT_AMBULATORY_CARE_PROVIDER_SITE_OTHER): Payer: Self-pay | Admitting: Urology

## 2019-03-08 VITALS — BP 132/67 | HR 80 | Ht 59.0 in | Wt 187.3 lb

## 2019-03-08 DIAGNOSIS — R102 Pelvic and perineal pain unspecified side: Secondary | ICD-10-CM

## 2019-03-08 DIAGNOSIS — R3915 Urgency of urination: Secondary | ICD-10-CM

## 2019-03-08 DIAGNOSIS — R300 Dysuria: Secondary | ICD-10-CM

## 2019-03-08 DIAGNOSIS — R350 Frequency of micturition: Secondary | ICD-10-CM

## 2019-03-08 NOTE — Progress Notes (Signed)
03/08/2019 10:48 AM   Sherri [name] Oct 06, 1985 [id number]  Referring provider: Antony Blackbird, MD [place],  [place] [address]  Chief Complaint  Patient presents with  . Hematuria    HPI: 34 y.o. female referred for evaluation of dysuria and hematuria.  She was initially seen at a [facility] on 01/28/2019 complaining of abdominal/back pain, nausea and dysuria.  She had had a virtual visit approximately 1 week prior to that and given a trial of Bactrim and Diflucan which did not help her symptoms.  She was treated with Naprosyn and Zofran.  She had 3 follow-up urgent care visits for persistent symptoms.  Dipstick urinalysis showed trace blood.  A urine culture on 12/15 was negative.  She complains of urinary frequency, urgency, dysuria and dyspareunia.  She has low back pain and pelvic pain.  Denies gross hematuria.  Denies previous history of urologic problems.  She does have fibromyalgia.   PMH: Past Medical History:  Diagnosis Date  . Fibromyalgia    denies today  . Genital herpes   . Hypertension    gestational  . 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 Urinary tract infection   . Uterine fibroid     Surgical History: Past Surgical History:  Procedure Laterality Date  . CESAREAN SECTION N/A 06/05/2014   Procedure: CESAREAN SECTION;  Surgeon: Truett Mainland, DO;  Location: [facility];  Service: Obstetrics;  Laterality: N/A;  . WISDOM TOOTH EXTRACTION      Home Medications:  Allergies as of 03/08/2019      Reactions   Macrobid [nitrofurantoin] Nausea And Vomiting      Medication List       Accurate as of March 08, 2019 10:48 AM. If you have any questions, ask your nurse or doctor.        STOP taking these medications   ondansetron 4 MG tablet Commonly known as: ZOFRAN Stopped by: Abbie Sons, MD       Allergies:  Allergies  Allergen Reactions  . Macrobid [Nitrofurantoin] Nausea And Vomiting    Family  History: Family History  Problem Relation Age of Onset  . Hypertension Mother   . Hypertension Father   . Breast cancer Maternal Grandmother        49  . Colon cancer Neg Hx   . Colon polyps Neg Hx   . Kidney disease Neg Hx   . Diabetes Neg Hx   . Esophageal cancer Neg Hx   . Gallbladder disease Neg Hx   . Heart disease Neg Hx   . Asthma Neg Hx   . Cancer Neg Hx   . Stroke Neg Hx     Social History:  reports that she has never smoked. She has never used smokeless tobacco. She reports that she does not drink alcohol or use drugs.  ROS: UROLOGY Frequent Urination?: Yes Hard to postpone urination?: No Burning/pain with urination?: Yes Get up at night to urinate?: Yes Leakage of urine?: No Urine stream starts and stops?: No Trouble starting stream?: No Do you have to strain to urinate?: No Blood in urine?: Yes Urinary tract infection?: Yes Sexually transmitted disease?: No Injury to kidneys or bladder?: No Painful intercourse?: Yes Weak stream?: No Currently pregnant?: No Vaginal bleeding?: No  Gastrointestinal Nausea?: Yes Vomiting?: Yes Indigestion/heartburn?: Yes Diarrhea?: No Constipation?: Yes  Constitutional Fever: Yes Night sweats?: No Weight loss?: No Fatigue?: Yes  Skin Skin rash/lesions?: No Itching?: No  Eyes Blurred  vision?: No Double vision?: No  Ears/Nose/Throat Sore throat?: No Sinus problems?: Yes  Hematologic/Lymphatic Swollen glands?: No Easy bruising?: No  Cardiovascular Leg swelling?: No Chest pain?: No  Respiratory Cough?: No Shortness of breath?: No  Endocrine Excessive thirst?: No  Musculoskeletal Back pain?: Yes Joint pain?: No  Neurological Headaches?: Yes Dizziness?: No  Psychologic Depression?: No Anxiety?: No  Physical Exam: BP 132/67 (BP Location: Left Arm, Patient Position: Sitting, Cuff Size: Normal)   Pulse 80   Ht 4\' 11"  (1.499 m)   Wt 187 lb 4.8 oz (85 kg)   BMI 37.83 kg/m     Constitutional:  Alert and oriented, No acute distress. HEENT: [place] AT, moist mucus membranes.  Trachea midline, no masses. Cardiovascular: No clubbing, cyanosis, or edema. Respiratory: Normal respiratory effort, no increased work of breathing. GI: Abdomen is soft, no abdominal masses, mild suprapubic tenderness GU: Mild low back tenderness Skin: No rashes, bruises or suspicious lesions. Neurologic: Grossly intact, no focal deficits, moving all 4 extremities. Psychiatric: Normal mood and affect.  Laboratory Data:  Urinalysis Dipstick trace intact blood Microscopy negative  Assessment & Plan:   34 y.o. female with frequency, urgency, dysuria and negative UA/urine culture.  She also has pelvic pain, low back pain.  She was given samples of Uribel and will call back regarding efficacy.  Follow-up 1 month and if still symptomatic we will perform cystoscopy at that visit.  We discussed other potential etiologies including interstitial cystitis/painful bladder syndrome.  Based on American Urological Association practice guidelines asymptomatic microhematuria [facility]) is defined as three or greater red blood cells per high powered field on a properly collected urinary specimen in the absence of an obvious benign cause. A positive dipstick does not define AMH, and evaluation should be based solely on findings from microscopic examination of urinary sediment and not on a dipstick reading.   Abbie Sons, [place] [address], [place], [place] [address] [phone]

## 2019-03-09 ENCOUNTER — Ambulatory Visit (INDEPENDENT_AMBULATORY_CARE_PROVIDER_SITE_OTHER): Payer: Self-pay | Admitting: Obstetrics and Gynecology

## 2019-03-09 ENCOUNTER — Encounter: Payer: Self-pay | Admitting: Obstetrics and Gynecology

## 2019-03-09 VITALS — BP 134/88 | HR 64 | Wt 187.0 lb

## 2019-03-09 DIAGNOSIS — R102 Pelvic and perineal pain: Secondary | ICD-10-CM | POA: Insufficient documentation

## 2019-03-09 DIAGNOSIS — N760 Acute vaginitis: Secondary | ICD-10-CM

## 2019-03-09 DIAGNOSIS — B9689 Other specified bacterial agents as the cause of diseases classified elsewhere: Secondary | ICD-10-CM

## 2019-03-09 DIAGNOSIS — K5909 Other constipation: Secondary | ICD-10-CM

## 2019-03-09 DIAGNOSIS — Z113 Encounter for screening for infections with a predominantly sexual mode of transmission: Secondary | ICD-10-CM

## 2019-03-09 DIAGNOSIS — N898 Other specified noninflammatory disorders of vagina: Secondary | ICD-10-CM | POA: Insufficient documentation

## 2019-03-09 LAB — URINALYSIS, COMPLETE
Bilirubin, UA: NEGATIVE
Glucose, UA: NEGATIVE
Ketones, UA: NEGATIVE
Leukocytes,UA: NEGATIVE
Nitrite, UA: NEGATIVE
Protein,UA: NEGATIVE
Specific Gravity, UA: 1.025 (ref 1.005–1.030)
Urobilinogen, Ur: 0.2 mg/dL (ref 0.2–1.0)
pH, UA: 5.5 (ref 5.0–7.5)

## 2019-03-09 LAB — POCT URINALYSIS DIP (DEVICE)
Bilirubin Urine: NEGATIVE
Glucose, UA: NEGATIVE mg/dL
Hgb urine dipstick: NEGATIVE
Ketones, ur: NEGATIVE mg/dL
Leukocytes,Ua: NEGATIVE
Nitrite: NEGATIVE
Protein, ur: NEGATIVE mg/dL
Specific Gravity, Urine: 1.01 (ref 1.005–1.030)
Urobilinogen, UA: 0.2 mg/dL (ref 0.0–1.0)
pH: 6 (ref 5.0–8.0)

## 2019-03-09 LAB — MICROSCOPIC EXAMINATION: RBC, Urine: NONE SEEN /hpf (ref 0–2)

## 2019-03-09 MED ORDER — ONDANSETRON HCL 4 MG PO TABS: 4.0000 mg | ORAL_TABLET | Freq: Three times a day (TID) | ORAL | 0 refills | Status: DC | PRN

## 2019-03-09 MED ORDER — METRONIDAZOLE 0.75 % VA GEL: 1.0000 | Freq: Every day | VAGINAL | 0 refills | Status: DC

## 2019-03-09 NOTE — Progress Notes (Signed)
GYNECOLOGY ANNUAL PREVENTATIVE CARE ENCOUNTER NOTE  History:     Sherri [name] is a 34 y.o. [id number] female here for vaginal odor that is excessive, white and has a fishy odor. Has had BV about 5 times in the last year. Says she takes Flagyl for the BV; has not tried metro- gel. She is seeing urology for possible cystitis.  Uses summers eve for hygiene.   No recent intercourse in the last 5 months.   Obstetric History OB History  Gravida Para Term Preterm AB Living  1 1   1   2   SAB TAB Ectopic Multiple Live Births        1 2    # Outcome Date GA Lbr Len/2nd Weight Sex Delivery Anes PTL Lv  1A Preterm 06/05/14 [redacted]w[redacted]d  4 lb 2 oz (1.87 kg) M CS-LTranv Spinal  LIV     Birth Comments: baby not banded in OR   1B Preterm 06/05/14 [redacted]w[redacted]d  4 lb 5.8 oz (1.98 kg) M CS-LTranv Spinal  LIV     Birth Comments: IUGR    Past Medical History:  Diagnosis Date  . Fibromyalgia    denies today  . Genital herpes   . Hypertension    gestational  . 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 Urinary tract infection   . Uterine fibroid     Past Surgical History:  Procedure Laterality Date  . CESAREAN SECTION N/A 06/05/2014   Procedure: CESAREAN SECTION;  Surgeon: Truett Mainland, DO;  Location: [facility];  Service: Obstetrics;  Laterality: N/A;  . WISDOM TOOTH EXTRACTION      Current Outpatient Medications on File Prior to Visit  Medication Sig Dispense Refill  . [DISCONTINUED] esomeprazole (NEXIUM) 40 MG capsule Take 1 capsule (40 mg total) by mouth daily. To reduce stomach acid (Patient not taking: Reported on 12/23/2018) 30 capsule 5  . [DISCONTINUED] fluticasone (FLONASE) 50 MCG/ACT nasal spray Place 2 sprays into both nostrils daily. 1 g 0   No current facility-administered medications on file prior to visit.    Allergies  Allergen Reactions  . Macrobid [Nitrofurantoin] Nausea And Vomiting    Social History:  reports that she has never smoked. She has never used  smokeless tobacco. She reports that she does not drink alcohol or use drugs.  Family History  Problem Relation Age of Onset  . Hypertension Mother   . Hypertension Father   . Breast cancer Maternal Grandmother        75  . Colon cancer Neg Hx   . Colon polyps Neg Hx   . Kidney disease Neg Hx   . Diabetes Neg Hx   . Esophageal cancer Neg Hx   . Gallbladder disease Neg Hx   . Heart disease Neg Hx   . Asthma Neg Hx   . Cancer Neg Hx   . Stroke Neg Hx     The following portions of the patient's history were reviewed and updated as appropriate: allergies, current medications, past family history, past medical history, past social history, past surgical history and problem list.  Review of Systems Pertinent items noted in HPI and remainder of comprehensive ROS otherwise negative.  Physical Exam:  BP 134/88   Pulse 64   Wt 187 lb (84.8 kg)   BMI 37.77 kg/m  CONSTITUTIONAL: Well-developed, well-nourished female in no acute distress.  HENT:  Normocephalic, atraumatic, External right and left ear normal. Oropharynx is clear and moist EYES: Conjunctivae and  EOM are normal. Pupils are equal, round, and reactive to light.  SKIN: Skin is warm and dry. No rash noted. Not diaphoretic.  MUSCULOSKELETAL: Normal range of motion. No tenderness.  No cyanosis, clubbing, or edema.  2+ distal pulses. NEUROLOGIC: Alert and oriented to person, place, and time.  ABDOMEN: Soft, no distention noted.  No tenderness, rebound or guarding.  PELVIC: Normal appearing external genitalia and urethral meatus; mild CMT, tenderness over bladder on bimanual exam. Wet prep collected  Assessment and Plan:    1. Screen for STD (sexually transmitted disease)  - Cervicovaginal ancillary only( [place]) - Urine Culture - pelvic US ordered   2. Foul smelling vaginal discharge  Rx: metrogel, discussed starting boric acid Daily probiotic   3. Chronic constipation  Miralax clean out 60-70 ounces of water  daily High fiber foods Start colace OTC     Corey Caulfield, Artist Pais, NP [place] for [employer], [place]

## 2019-03-09 NOTE — Patient Instructions (Addendum)
Boric Acid vaginal suppository What is this medicine? BORIC ACID (BOHR ik AS id) helps to promote the proper acid balance in the vagina. It is used to help treat yeast infections of the vagina and relieve symptoms such as itching and burning. This medicine may be used for other purposes; ask your health care provider or pharmacist if you have questions. COMMON BRAND NAME(S): Hylafem What should I tell my health care provider before I take this medicine? They need to know if you have any of these conditions:  diabetes  frequent infections  HIV or AIDS  immune system problems  an unusual or allergic reaction to boric acid, other medicines, foods, dyes, or preservatives  pregnant or trying to get pregnant  breast-feeding How should I use this medicine? This medicine is for use in the vagina. Do not take by mouth. Follow the directions on the prescription label. Read package directions carefully before using. Wash hands before and after use. Use this medicine at bedtime, unless otherwise directed by your doctor. Do not use your medicine more often than directed. Do not stop using this medicine except on your doctor's advice. Talk to your pediatrician regarding the use of this medicine in children. This medicine is not approved for use in children. Overdosage: If you think you have taken too much of this medicine contact a poison control center or emergency room at once. NOTE: This medicine is only for you. Do not share this medicine with others. What if I miss a dose? If you miss a dose, use it as soon as you can. If it is almost time for your next dose, use only that dose. Do not use double or extra doses. What may interact with this medicine? Interactions are not expected. Do not use any other vaginal products without telling your doctor or health care professional. This list may not describe all possible interactions. Give your health care provider a list of all the medicines, herbs,  non-prescription drugs, or dietary supplements you use. Also tell them if you smoke, drink alcohol, or use illegal drugs. Some items may interact with your medicine. What should I watch for while using this medicine? Tell your doctor or health care professional if your symptoms do not start to get better within a few days. It is better not to have sex until you have finished your treatment. This medicine may damage condoms or diaphragms and cause them not to work properly. It may also decrease the effect of vaginal spermicides. Do not rely on any of these methods to prevent sexually transmitted diseases or pregnancy while you are using this medicine. Vaginal medicines usually will come out of the vagina during treatment. To keep the medicine from getting on your clothing, wear a panty liner. The use of tampons is not recommended. To help clear up the infection, wear freshly washed cotton, not synthetic, underwear. What side effects may I notice from receiving this medicine? Side effects that you should report to your doctor or health care professional as soon as possible:  allergic reactions like skin rash, itching or hives  vaginal irritation, redness, or burning Side effects that usually do not require medical attention (report to your doctor or health care professional if they continue or are bothersome):  vaginal discharge This list may not describe all possible side effects. Call your doctor for medical advice about side effects. You may report side effects to FDA at 1-800-FDA-1088. Where should I keep my medicine? Keep out of the reach of  children. Store in a cool, dry place between 15 and 30 degrees C (59 and 86 degrees F). Keep away from sunlight. Throw away any unused medicine after the expiration date. NOTE: This sheet is a summary. It may not cover all possible information. If you have questions about this medicine, talk to your doctor, pharmacist, or health care provider.  2020  Elsevier/Gold Standard (2015-03-21 07:29:58)    You have constipation which is hard stools that are difficult to pass. It is important to have regular bowel movements every 1-3 days that are soft and easy to pass. Hard stools increase your risk of hemorrhoids and are very uncomfortable.   To prevent constipation you can increase the amount of fiber in your diet. Examples of foods with fiber are leafy greens, whole grain breads, oatmeal and other grains.  It is also important to drink at least eight 8oz glass of water everyday.   If you have not has a bowel movement in 4-5 days you made need to clean out your bowel.  This will have establish normal movement through your bowel.    Miralax Clean out  Take 8 capfuls of miralax in 64 oz of gatorade. You can use any fluid that appeals to you (gatorade, water, juice)  Continue to drink at least eight 8 oz glasses of water throughout the day  You can repeat with another 8 capfuls of miralax in 64 oz of gatorade if you are not having a large amount of stools  You will need to be at home and close to a bathroom for about 8 hours when you do the above as you may need to go to the bathroom frequently.   After you are cleaned out: - Start Colace100mg  twice daily - Start Miralax once daily - Start a daily fiber supplement like metamucil or citrucel - You can safely use enemas in pregnancy  - if you are having diarrhea you can reduce to Colace once a day or miralax every other day or a 1/2 capful daily.

## 2019-03-10 ENCOUNTER — Telehealth (HOSPITAL_COMMUNITY): Payer: Self-pay | Admitting: *Deleted

## 2019-03-10 ENCOUNTER — Encounter: Payer: Self-pay | Admitting: Urology

## 2019-03-10 LAB — CERVICOVAGINAL ANCILLARY ONLY
Bacterial Vaginitis (gardnerella): POSITIVE — AB
Candida Glabrata: NEGATIVE
Candida Vaginitis: NEGATIVE
Chlamydia: NEGATIVE
Comment: NEGATIVE
Comment: NEGATIVE
Comment: NEGATIVE
Comment: NEGATIVE
Comment: NEGATIVE
Comment: NORMAL
Neisseria Gonorrhea: NEGATIVE
Trichomonas: NEGATIVE

## 2019-03-10 NOTE — Telephone Encounter (Signed)
Attempted to call patient to get clarification on if she needs an appointment for [facility]CP or one of the free cancer screenings. No one answered the phone. No voicemail was set up, therefore unable to leave a message for patient to return my phone call.

## 2019-03-11 LAB — URINE CULTURE

## 2019-03-24 ENCOUNTER — Other Ambulatory Visit: Payer: Self-pay

## 2019-03-24 ENCOUNTER — Encounter (HOSPITAL_COMMUNITY): Payer: Self-pay

## 2019-03-24 ENCOUNTER — Ambulatory Visit (HOSPITAL_COMMUNITY)
Admission: EM | Admit: 2019-03-24 | Discharge: 2019-03-24 | Disposition: A | Discharge status: Home / Self Care | Payer: HRSA Program | Attending: Family Medicine | Admitting: Family Medicine

## 2019-03-24 DIAGNOSIS — Z20822 Contact with and (suspected) exposure to covid-19: Secondary | ICD-10-CM | POA: Diagnosis not present

## 2019-03-24 DIAGNOSIS — R197 Diarrhea, unspecified: Secondary | ICD-10-CM | POA: Insufficient documentation

## 2019-03-24 DIAGNOSIS — K649 Unspecified hemorrhoids: Secondary | ICD-10-CM

## 2019-03-24 MED ORDER — LIDOCAINE 5 % EX OINT: 1.0000 "application " | TOPICAL_OINTMENT | CUTANEOUS | 0 refills | Status: DC | PRN

## 2019-03-24 MED ORDER — HYDROCORTISONE ACETATE 1 % EX OINT: 1.0000 "application " | TOPICAL_OINTMENT | Freq: Two times a day (BID) | CUTANEOUS | 0 refills | Status: DC | PRN

## 2019-03-24 NOTE — ED Provider Notes (Signed)
[facility]    CSN: [id number] Arrival date & time: 03/24/19  1043      History   Chief Complaint Chief Complaint  Patient presents with  . Generalized Body Aches  . Hemorrhoids    HPI Sherri [name] is a 34 y.o. female.   Sherri [name] presents for covid testing- her boyfriend tested positive 1 week ago. She complains of body aches and fatigue, no fevers. No cough or congestion. Some headache. Diarrhea, nausea without vomiting. Her symptoms started 5 days ago. Not working currently.  Also complaints of irritation to rectum, for the past 5 days. Has been using preparation h which haven't helped. Has had similar with hemmrohids in the past, has been months ago, blood noted with wiping. No abdominal pain.    ROS per HPI, negative if not otherwise mentioned.      Past Medical History:  Diagnosis Date  . Fibromyalgia    denies today  . Genital herpes   . Hypertension    gestational  . 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 Urinary tract infection   . Uterine fibroid     Patient Active Problem List   Diagnosis Date Noted  . Foul smelling vaginal discharge 03/09/2019  . Chronic constipation 03/09/2019  . Pelvic pain in female 03/09/2019  . Exposure to HIV 02/02/2019  . Hypertension   . Genital herpes   . Yeast infection 12/03/2016  . Status post cesarean delivery 06/06/2014  . Uterine fibroid 12/25/2013  . Obesity 12/25/2013  . Screen for STD (sexually transmitted disease) 04/27/2012    Past Surgical History:  Procedure Laterality Date  . CESAREAN SECTION N/A 06/05/2014   Procedure: CESAREAN SECTION;  Surgeon: Truett Mainland, DO;  Location: [facility];  Service: Obstetrics;  Laterality: N/A;  . WISDOM TOOTH EXTRACTION      OB History    Gravida  1   Para  1   Term      Preterm  1   AB      Living  2     SAB      TAB      Ectopic      Multiple  1   Live Births  2            Home Medications     Prior to Admission medications   Medication Sig Start Date End Date Taking? Authorizing Provider  Hydrocortisone Acetate 1 % OINT Apply 1 application topically 2 (two) times daily as needed. 03/24/19   Zigmund Gottron, NP  lidocaine (XYLOCAINE) 5 % ointment Apply 1 application topically as needed. 03/24/19   Zigmund Gottron, NP  metroNIDAZOLE (METROGEL VAGINAL) 0.75 % vaginal gel Place 1 Applicatorful vaginally at bedtime. 03/09/19   Rasch, Anderson Malta I, NP  ondansetron (ZOFRAN) 4 MG tablet Take 1 tablet (4 mg total) by mouth every 8 (eight) hours as needed for nausea or vomiting. 03/09/19   Rasch, Anderson Malta I, NP  esomeprazole (NEXIUM) 40 MG capsule Take 1 capsule (40 mg total) by mouth daily. To reduce stomach acid Patient not taking: Reported on 12/23/2018 03/07/18 12/30/18  Fulp, Cammie, MD  fluticasone (FLONASE) 50 MCG/ACT nasal spray Place 2 sprays into both nostrils daily. 02/11/19 02/14/19  Ok Edwards, PA-C    Family History Family History  Problem Relation Age of Onset  . Hypertension Mother   . Hypertension Father   . Breast cancer Maternal Grandmother  70  . Colon cancer Neg Hx   . Colon polyps Neg Hx   . Kidney disease Neg Hx   . Diabetes Neg Hx   . Esophageal cancer Neg Hx   . Gallbladder disease Neg Hx   . Heart disease Neg Hx   . Asthma Neg Hx   . Cancer Neg Hx   . Stroke Neg Hx     Social History Social History   Tobacco Use  . Smoking status: Never Smoker  . Smokeless tobacco: Never Used  Substance Use Topics  . Alcohol use: No  . Drug use: No     Allergies   Macrobid [nitrofurantoin]   Review of Systems Review of Systems   Physical Exam Triage Vital Signs ED Triage Vitals  Enc Vitals Group     BP 03/24/19 1058 129/72     Pulse Rate 03/24/19 1058 76     Resp 03/24/19 1058 16     Temp 03/24/19 1058 98.9 F (37.2 C)     Temp Source 03/24/19 1058 Oral     SpO2 03/24/19 1058 98 %     Weight --      Height --      Head Circumference --       Peak Flow --      Pain Score 03/24/19 1057 10     Pain Loc --      Pain Edu? --      Excl. in [place]? --    No data found.  Updated Vital Signs BP 129/72 (BP Location: Left Arm)   Pulse 76   Temp 98.9 F (37.2 C) (Oral)   Resp 16   LMP 03/18/2019 (Exact Date)   SpO2 98%    Physical Exam Constitutional:      General: She is not in acute distress.    Appearance: She is well-developed.  Cardiovascular:     Rate and Rhythm: Normal rate.  Pulmonary:     Effort: Pulmonary effort is normal.  Genitourinary:    Rectum: External hemorrhoid present.     Comments: External thrombosed hemorrhoid with tenderness  Skin:    General: Skin is warm and dry.  Neurological:     Mental Status: She is alert and oriented to person, place, and time.      UC Treatments / Results  Labs (all labs ordered are listed, but only abnormal results are displayed) Labs Reviewed  NOVEL CORONAVIRUS, NAA (HOSP ORDER, SEND-OUT TO REF LAB; TAT 18-24 HRS)    EKG   Radiology No results found.  Procedures Procedures (including critical care time)  Medications Ordered in UC Medications - No data to display  Initial Impression / Assessment and Plan / UC Course  I have reviewed the triage vital signs and the nursing notes.  Pertinent labs & imaging results that were available during my care of the patient were reviewed by me and considered in my medical decision making (see chart for details).     High suspicion for covid-19, exposed by her boyfriend and with symptoms. Supportive cares recommended. pcr testing in process. Hemorrhoids. Thrombosed, with suspicion of covid deferred incision today. Treatment and prevention of hemorrhoids discussed. Return precautions provided. Patient verbalized understanding and agreeable to plan.   Final Clinical Impressions(s) / UC Diagnoses   Final diagnoses:  Exposure to COVID-19 virus  Diarrhea, unspecified type  Hemorrhoids, unspecified hemorrhoid type      Discharge Instructions     Please self isolate as there is concern for covid-19  with your symptoms and exposure.  Will notify you of any positive findings on your testing. You may monitor your results on your MyChart online as well.   Tylenol and/or ibuprofen as needed for pain or fevers.  Push fluids to ensure adequate hydration and keep secretions thin.  Over the counter medications as needed for symptoms.  Use of Preparation H with lidocaine as needed to help with symptoms, I have prescribed creams which may be used- but if these are too expensive the over the counter Preparation H with lidocaine is recommended.  Sitz baths, as discussed in provided education.  If persistent please follow up with GI or general surgery     ED Prescriptions    Medication Sig Dispense Auth. Provider   Hydrocortisone Acetate 1 % OINT Apply 1 application topically 2 (two) times daily as needed. 28.4 g Augusto Gamble B, NP   lidocaine (XYLOCAINE) 5 % ointment Apply 1 application topically as needed. 35.44 g Zigmund Gottron, NP     PDMP not reviewed this encounter.   Zigmund Gottron, NP 03/24/19 1649

## 2019-03-24 NOTE — Discharge Instructions (Signed)
Please self isolate as there is concern for covid-19 with your symptoms and exposure.  Will notify you of any positive findings on your testing. You may monitor your results on your MyChart online as well.   Tylenol and/or ibuprofen as needed for pain or fevers.  Push fluids to ensure adequate hydration and keep secretions thin.  Over the counter medications as needed for symptoms.  Use of Preparation H with lidocaine as needed to help with symptoms, I have prescribed creams which may be used- but if these are too expensive the over the counter Preparation H with lidocaine is recommended.  Sitz baths, as discussed in provided education.  If persistent please follow up with GI or general surgery

## 2019-03-24 NOTE — ED Triage Notes (Signed)
Pt reports having body aches x 3 days; COVID test requested. Pt reports having pain, itching  and swelling in her anus x 1 week.

## 2019-03-25 ENCOUNTER — Encounter (HOSPITAL_COMMUNITY): Payer: Self-pay

## 2019-03-25 ENCOUNTER — Other Ambulatory Visit: Payer: Self-pay

## 2019-03-25 ENCOUNTER — Emergency Department (HOSPITAL_COMMUNITY)
Admission: EM | Admit: 2019-03-25 | Discharge: 2019-03-25 | Disposition: A | Discharge status: Home / Self Care | Payer: Self-pay | Attending: Emergency Medicine | Admitting: Emergency Medicine

## 2019-03-25 ENCOUNTER — Telehealth (HOSPITAL_COMMUNITY): Payer: Self-pay

## 2019-03-25 DIAGNOSIS — K648 Other hemorrhoids: Secondary | ICD-10-CM

## 2019-03-25 DIAGNOSIS — Z79899 Other long term (current) drug therapy: Secondary | ICD-10-CM | POA: Insufficient documentation

## 2019-03-25 DIAGNOSIS — I10 Essential (primary) hypertension: Secondary | ICD-10-CM | POA: Insufficient documentation

## 2019-03-25 DIAGNOSIS — K649 Unspecified hemorrhoids: Secondary | ICD-10-CM | POA: Insufficient documentation

## 2019-03-25 MED ORDER — NAPROXEN 500 MG PO TABS: 500.0000 mg | ORAL_TABLET | Freq: Two times a day (BID) | ORAL | 0 refills | Status: DC

## 2019-03-25 MED ORDER — ACETAMINOPHEN 325 MG PO TABS
650.0000 mg | ORAL_TABLET | Freq: Once | ORAL | Status: AC
  Administered 2019-03-25: 650 mg via ORAL
  Filled 2019-03-25: qty 2

## 2019-03-25 NOTE — ED Provider Notes (Signed)
[facility] DEPT Provider Note   CSN: [id number] Arrival date & time: 03/25/19  1030     History Chief Complaint  Patient presents with  . hemorrhoid pain  . Rectal Bleeding    Sherri [name] is a 34 y.o. female.  Patient is a 34 year old female with past medical history of fibromyalgia, hypertension presenting to the emergency department for hemorrhoids.  Patient was seen in urgent care yesterday for hemorrhoids and given hydrocortisone cream.  Reports that her pain is still 10 out of 10 and she is having some rectal bleeding.  She denies any fever, chills, diarrhea.  Patient denies any URI symptoms to me but upon review of her urgent care note from yesterday, her boyfriend tested positive for Covid and she admitted URI symptoms and Covid test is currently pending.  Patient reports that she is using her hydrocortisone cream but not taking any stool softeners. Reports started to have rectal bleeding yesterday and continuous pain        Past Medical History:  Diagnosis Date  . Fibromyalgia    denies today  . Genital herpes   . Hypertension    gestational  . 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 Urinary tract infection   . Uterine fibroid     Patient Active Problem List   Diagnosis Date Noted  . Foul smelling vaginal discharge 03/09/2019  . Chronic constipation 03/09/2019  . Pelvic pain in female 03/09/2019  . Exposure to HIV 02/02/2019  . Hypertension   . Genital herpes   . Yeast infection 12/03/2016  . Status post cesarean delivery 06/06/2014  . Uterine fibroid 12/25/2013  . Obesity 12/25/2013  . Screen for STD (sexually transmitted disease) 04/27/2012    Past Surgical History:  Procedure Laterality Date  . CESAREAN SECTION N/A 06/05/2014   Procedure: CESAREAN SECTION;  Surgeon: Truett Mainland, DO;  Location: [facility];  Service: Obstetrics;  Laterality: N/A;  . WISDOM TOOTH EXTRACTION       OB History    Gravida  1   Para  1   Term      Preterm  1   AB      Living  2     SAB      TAB      Ectopic      Multiple  1   Live Births  2           Family History  Problem Relation Age of Onset  . Hypertension Mother   . Hypertension Father   . Breast cancer Maternal Grandmother        44  . Colon cancer Neg Hx   . Colon polyps Neg Hx   . Kidney disease Neg Hx   . Diabetes Neg Hx   . Esophageal cancer Neg Hx   . Gallbladder disease Neg Hx   . Heart disease Neg Hx   . Asthma Neg Hx   . Cancer Neg Hx   . Stroke Neg Hx     Social History   Tobacco Use  . Smoking status: Never Smoker  . Smokeless tobacco: Never Used  Substance Use Topics  . Alcohol use: No  . Drug use: No    Home Medications Prior to Admission medications   Medication Sig Start Date End Date Taking? Authorizing Provider  Hydrocortisone Acetate 1 % OINT Apply 1 application topically 2 (two) times daily as needed. 03/24/19   Zigmund Gottron, NP  lidocaine (  XYLOCAINE) 5 % ointment Apply 1 application topically as needed. 03/24/19   Zigmund Gottron, NP  metroNIDAZOLE (METROGEL VAGINAL) 0.75 % vaginal gel Place 1 Applicatorful vaginally at bedtime. 03/09/19   Rasch, Anderson Malta I, NP  naproxen (NAPROSYN) 500 MG tablet Take 1 tablet (500 mg total) by mouth 2 (two) times daily. 03/25/19   Madilyn Hook A, PA-C  ondansetron (ZOFRAN) 4 MG tablet Take 1 tablet (4 mg total) by mouth every 8 (eight) hours as needed for nausea or vomiting. 03/09/19   Rasch, Anderson Malta I, NP  esomeprazole (NEXIUM) 40 MG capsule Take 1 capsule (40 mg total) by mouth daily. To reduce stomach acid Patient not taking: Reported on 12/23/2018 03/07/18 12/30/18  Fulp, Cammie, MD  fluticasone (FLONASE) 50 MCG/ACT nasal spray Place 2 sprays into both nostrils daily. 02/11/19 02/14/19  Ok Edwards, PA-C    Allergies    Macrobid [nitrofurantoin]  Review of Systems   Review of Systems  Constitutional: Negative for chills and fever.  Respiratory:  Negative for cough and shortness of breath.   Gastrointestinal: Positive for anal bleeding, blood in stool and rectal pain. Negative for abdominal distention, abdominal pain, constipation, diarrhea, nausea and vomiting.  Genitourinary: Negative for dysuria.  Skin: Negative for rash and wound.  Neurological: Negative for dizziness, light-headedness and headaches.    Physical Exam Updated Vital Signs BP 140/80   Pulse 71   Temp 98.1 F (36.7 C)   Resp 16   Ht 4\' 11"  (1.499 m)   Wt 84.8 kg   LMP 03/18/2019 (Exact Date)   SpO2 100%   BMI 37.77 kg/m   Physical Exam Vitals and nursing note reviewed. Exam conducted with a chaperone present.  Constitutional:      General: She is not in acute distress.    Appearance: Normal appearance. She is not ill-appearing, toxic-appearing or diaphoretic.  HENT:     Head: Normocephalic.  Eyes:     Conjunctiva/sclera: Conjunctivae normal.  Cardiovascular:     Rate and Rhythm: Normal rate and regular rhythm.  Pulmonary:     Effort: Pulmonary effort is normal.  Genitourinary:    Rectum: Tenderness and external hemorrhoid present.    Skin:    General: Skin is dry.  Neurological:     Mental Status: She is alert.  Psychiatric:        Mood and Affect: Mood normal.     ED Results / Procedures / Treatments   Labs (all labs ordered are listed, but only abnormal results are displayed) Labs Reviewed - No data to display  EKG None  Radiology No results found.  Procedures Procedures (including critical care time)  Medications Ordered in ED Medications  acetaminophen (TYLENOL) tablet 650 mg (650 mg Oral Given 03/25/19 1124)    ED Course  I have reviewed the triage vital signs and the nursing notes.  Pertinent labs & imaging results that were available during my care of the patient were reviewed by me and considered in my medical decision making (see chart for details).    MDM Rules/Calculators/A&P                      Based on  review of vitals, medical screening exam, lab work and/or imaging, there does not appear to be an acute, emergent etiology for the patient's symptoms. Counseled pt on good return precautions and encouraged both PCP and ED follow-up as needed.  Prior to discharge, I also discussed incidental imaging findings with patient in  detail and advised appropriate, recommended follow-up in detail.  Clinical Impression: 1. Other hemorrhoids     Disposition: Discharge  Prior to providing a prescription for a controlled substance, I independently reviewed the patient's recent prescription history on the [facility]. The patient had no recent or regular prescriptions and was deemed appropriate for a brief, less than 3 day prescription of narcotic for acute analgesia.  This note was prepared with assistance of Systems analyst. Occasional wrong-word or sound-a-like substitutions may have occurred due to the inherent limitations of voice recognition software.  Final Clinical Impression(s) / ED Diagnoses Final diagnoses:  Other hemorrhoids    Rx / DC Orders ED Discharge Orders         Ordered    naproxen (NAPROSYN) 500 MG tablet  2 times daily     03/25/19 1134           Kristine Royal 03/25/19 1135    Dorie Rank, MD 03/26/19 [phone]

## 2019-03-25 NOTE — ED Triage Notes (Signed)
Patient states she has hemorrhoidal pain and went to an UC yesterday where she was prescribed Hydrocortisone cream. Patient states she has been been bleeding from her hemorrhoids since using the cream

## 2019-03-25 NOTE — Discharge Instructions (Addendum)
You are seen today for hemorrhoids.  Continue to use BOTH creams prescribed by urgent care. The hydrocortisone AND lidocaine cream. We have prescribed you an antiinflammatory to help with relief. You can get further treatment from general surgery or sometimes primary care where they can do a possible procedure to remove the hemorrhoid.  You also stop a Covid test pending so please stay home and isolate yourself until you know your results are negative and until it has been at least 2 weeks since your last exposure.  Go to the bathroom when you have the urge to have a bowel movement. Do not wait. Avoid straining to have bowel movements. Keep the anal area dry and clean. Use wet toilet paper or moist towelettes after a bowel movement. Do not sit on the toilet for long periods of time. This increases blood pooling and pain.  Apply ice packs and take sitz baths for relief

## 2019-03-26 LAB — NOVEL CORONAVIRUS, NAA (HOSP ORDER, SEND-OUT TO REF LAB; TAT 18-24 HRS): SARS-CoV-2, NAA: NOT DETECTED

## 2019-03-28 ENCOUNTER — Encounter: Payer: Self-pay | Admitting: Nurse Practitioner

## 2019-03-28 ENCOUNTER — Other Ambulatory Visit: Payer: Self-pay

## 2019-03-28 ENCOUNTER — Ambulatory Visit: Payer: Self-pay | Attending: Nurse Practitioner | Admitting: Nurse Practitioner

## 2019-03-28 DIAGNOSIS — K645 Perianal venous thrombosis: Secondary | ICD-10-CM

## 2019-03-28 DIAGNOSIS — K5909 Other constipation: Secondary | ICD-10-CM

## 2019-03-28 MED ORDER — HYDROCORTISONE ACETATE 1 % EX OINT: 1.0000 "application " | TOPICAL_OINTMENT | Freq: Two times a day (BID) | CUTANEOUS | 0 refills | Status: DC | PRN

## 2019-03-28 MED ORDER — LIDOCAINE 5 % EX OINT: 1.0000 "application " | TOPICAL_OINTMENT | CUTANEOUS | 0 refills | Status: DC | PRN

## 2019-03-28 MED ORDER — SENNOSIDES-DOCUSATE SODIUM 8.6-50 MG PO TABS: 2.0000 | ORAL_TABLET | Freq: Two times a day (BID) | ORAL | 0 refills | Status: AC

## 2019-03-28 MED ORDER — HYDROCORTISONE ACETATE 25 MG RE SUPP: 25.0000 mg | Freq: Two times a day (BID) | RECTAL | 0 refills | Status: DC

## 2019-03-28 NOTE — Progress Notes (Signed)
Virtual Visit via Telephone Note Due to national recommendations of social distancing due to [place] 19, telehealth visit is felt to be most appropriate for this patient at this time.  I discussed the limitations, risks, security and privacy concerns of performing an evaluation and management service by telephone and the availability of in person appointments. I also discussed with the patient that there may be a patient responsible charge related to this service. The patient expressed understanding and agreed to proceed.    I connected with Sherri [name] on 03/28/19  at   2:10 PM EST  EDT by telephone and verified that I am speaking with the correct person using two identifiers.   Consent I discussed the limitations, risks, security and privacy concerns of performing an evaluation and management service by telephone and the availability of in person appointments. I also discussed with the patient that there may be a patient responsible charge related to this service. The patient expressed understanding and agreed to proceed.   Location of Patient: Private  Residence   Location of Provider: [place] [facility] [place] participating in Telemedicine visit: Geryl Rankins FNP-BC [place]    History of Present Illness: Telemedicine visit for: Hemorrhoids and Rectal bleeding  Seen in the ER 3 days ago with c/o hemorrhoids after being treated at the urgent care one day prior for the same concern. At the urgent care she had been given hydrocortisone cream which she reported using with no relief. She was noted for large external thrombosed hemorrhoid with slight oozing of blood at the emergency room. She was given a prescription for naproxen and instructed to follow up with PCP and [facility].  Today she reports persistent pain and bleeding from the hemorrhoid site. Unfortunately she also has chronic constipation which is likely a cause of the hemorrhoidal  pain. Endorses weakness and fatigue. Currently using hydrocortisone ointment, lidocaine ointment and naproxen. She has not contacted [facility] as she states she wanted to discuss with her PCP prior to calling them for consult.   Will trial her on suppository and laxative. I have instructed her that if she does not notice any improvement in 2 days she will need to contact [facility].       Past Medical History:  Diagnosis Date  . Fibromyalgia    denies today  . Genital herpes   . Hypertension    gestational  . 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 Urinary tract infection   . Uterine fibroid     Past Surgical History:  Procedure Laterality Date  . CESAREAN SECTION N/A 06/05/2014   Procedure: CESAREAN SECTION;  Surgeon: Truett Mainland, DO;  Location: [facility];  Service: Obstetrics;  Laterality: N/A;  . WISDOM TOOTH EXTRACTION      Family History  Problem Relation Age of Onset  . Hypertension Mother   . Hypertension Father   . Breast cancer Maternal Grandmother        61  . Colon cancer Neg Hx   . Colon polyps Neg Hx   . Kidney disease Neg Hx   . Diabetes Neg Hx   . Esophageal cancer Neg Hx   . Gallbladder disease Neg Hx   . Heart disease Neg Hx   . Asthma Neg Hx   . Cancer Neg Hx   . Stroke Neg Hx     Social History   Socioeconomic History  . Marital status: Single  Spouse name: Not on file  . Number of children: Not on file  . Years of education: Not on file  . Highest education level: Not on file  Occupational History  . Occupation: [employer]  Tobacco Use  . Smoking status: Never Smoker  . Smokeless tobacco: Never Used  Substance and Sexual Activity  . Alcohol use: No  . Drug use: No  . Sexual activity: Yes    Birth control/protection: None  Other Topics Concern  . Not on file  Social History Narrative  . Not on file   Social Determinants of Health   Financial Resource Strain:   . Difficulty of Paying Living Expenses: Not on file  Food  Insecurity:   . Worried About Charity fundraiser in the Last Year: Not on file  . Ran Out of Food in the Last Year: Not on file  Transportation Needs:   . Lack of Transportation (Medical): Not on file  . Lack of Transportation (Non-Medical): Not on file  Physical Activity:   . Days of Exercise per Week: Not on file  . Minutes of Exercise per Session: Not on file  Stress:   . Feeling of Stress : Not on file  Social Connections:   . Frequency of Communication with Friends and Family: Not on file  . Frequency of Social Gatherings with Friends and Family: Not on file  . Attends Religious Services: Not on file  . Active Member of Clubs or Organizations: Not on file  . Attends Archivist Meetings: Not on file  . Marital Status: Not on file     Observations/Objective: Awake, alert and oriented x 3   Review of Systems  Constitutional: Negative for fever, malaise/fatigue and weight loss.  HENT: Negative.  Negative for nosebleeds.   Eyes: Negative.  Negative for blurred vision, double vision and photophobia.  Respiratory: Negative.  Negative for cough and shortness of breath.   Cardiovascular: Negative.  Negative for chest pain, palpitations and leg swelling.  Gastrointestinal: Positive for constipation. Negative for abdominal pain, diarrhea, heartburn, nausea and vomiting.       SEE HPI  Musculoskeletal: Negative.  Negative for myalgias.  Neurological: Negative.  Negative for dizziness, focal weakness, seizures and headaches.  Psychiatric/Behavioral: Negative.  Negative for suicidal ideas.    Assessment and Plan: Sherri [name] was seen today for hemorrhoids.  Diagnoses and all orders for this visit:  Chronic constipation -     senna-docusate (SENOKOT-S) 8.6-50 MG tablet; Take 2 tablets by mouth 2 (two) times daily for 7 days.  External hemorrhoid, thrombosed -     lidocaine (XYLOCAINE) 5 % ointment; Apply 1 application topically as needed. -     Hydrocortisone Acetate 1 %  OINT; Apply 1 application topically 2 (two) times daily as needed. -     hydrocortisone (ANUSOL-HC) 25 MG suppository; Place 1 suppository (25 mg total) rectally 2 (two) times daily.     Follow Up Instructions Return if symptoms worsen or fail to improve.     I discussed the assessment and treatment plan with the patient. The patient was provided an opportunity to ask questions and all were answered. The patient agreed with the plan and demonstrated an understanding of the instructions.   The patient was advised to call back or seek an in-person evaluation if the symptoms worsen or if the condition fails to improve as anticipated.  I provided 18 minutes of non-face-to-face time during this encounter including median intraservice time, reviewing previous notes, labs, imaging,  medications and explaining diagnosis and management.  Gildardo Pounds, FNP-BC

## 2019-04-06 ENCOUNTER — Encounter: Payer: Self-pay | Admitting: Family Medicine

## 2019-04-06 ENCOUNTER — Ambulatory Visit: Payer: Self-pay | Attending: Family Medicine | Admitting: Family Medicine

## 2019-04-06 ENCOUNTER — Other Ambulatory Visit: Payer: Self-pay

## 2019-04-06 VITALS — BP 139/97 | HR 76 | Temp 97.8°F | Resp 12 | Ht 59.0 in | Wt 190.0 lb

## 2019-04-06 DIAGNOSIS — N898 Other specified noninflammatory disorders of vagina: Secondary | ICD-10-CM

## 2019-04-06 DIAGNOSIS — R300 Dysuria: Secondary | ICD-10-CM

## 2019-04-06 DIAGNOSIS — K5909 Other constipation: Secondary | ICD-10-CM

## 2019-04-06 DIAGNOSIS — N3001 Acute cystitis with hematuria: Secondary | ICD-10-CM

## 2019-04-06 DIAGNOSIS — R110 Nausea: Secondary | ICD-10-CM

## 2019-04-06 DIAGNOSIS — R1084 Generalized abdominal pain: Secondary | ICD-10-CM

## 2019-04-06 DIAGNOSIS — R5383 Other fatigue: Secondary | ICD-10-CM

## 2019-04-06 LAB — POCT URINALYSIS DIP (CLINITEK)
Bilirubin, UA: NEGATIVE
Blood, UA: NEGATIVE
Glucose, UA: NEGATIVE mg/dL
Ketones, POC UA: NEGATIVE mg/dL
Leukocytes, UA: NEGATIVE
Nitrite, UA: NEGATIVE
POC PROTEIN,UA: NEGATIVE
Spec Grav, UA: 1.025
Urobilinogen, UA: 0.2 U/dL
pH, UA: 7

## 2019-04-06 LAB — POCT URINE PREGNANCY: Preg Test, Ur: NEGATIVE

## 2019-04-06 MED ORDER — CEPHALEXIN 500 MG PO CAPS: 500.0000 mg | ORAL_CAPSULE | Freq: Three times a day (TID) | ORAL | 0 refills | Status: DC

## 2019-04-06 MED ORDER — POLYETHYLENE GLYCOL 3350 17 GM/SCOOP PO POWD: ORAL | 11 refills | Status: DC

## 2019-04-06 MED ORDER — ONDANSETRON HCL 4 MG PO TABS: 4.0000 mg | ORAL_TABLET | Freq: Three times a day (TID) | ORAL | 0 refills | Status: DC | PRN

## 2019-04-06 MED ORDER — FLUCONAZOLE 150 MG PO TABS: 150.0000 mg | ORAL_TABLET | Freq: Once | ORAL | 0 refills | Status: AC

## 2019-04-06 MED FILL — ONDANSETRON HCL 4 MG TABLET: 4 | 6 days supply | Qty: 20 | Fill #0

## 2019-04-06 MED FILL — CEPHALEXIN 500 MG CAPSULE: 500 | 7 days supply | Qty: 21 | Fill #0

## 2019-04-06 MED FILL — FLUCONAZOLE 150 MG TABLET: 150 | 3 days supply | Qty: 2 | Fill #0

## 2019-04-06 NOTE — Progress Notes (Signed)
Established Patient Office Visit  Subjective:  Patient ID: Sherri [name], female    DOB: January 02, 1986  Age: 34 y.o. MRN: [id number]  CC: No chief complaint on file.   HPI Sherri [name], 34 year old African-American female, who presents secondary to complaint of over 1 week of burning with urination, urinary frequency and a foul odor to the urine.  She denies any fever or chills.  She has had nausea and some mild increase in back pain.  She has also had some generalized abdominal pain and issues with constipation.  Her issues with constipation have been longstanding.  She is not currently taking any medicine for constipation other than as needed laxative use.  She did have a bowel movement yesterday.  She denies any blood in the stool and no black/tarry stools.  She was having issues with hemorrhoids and is status post urgent care visit on 03/25/2019 followed by ED visit on 03/25/2019 due to continued pain related to hemorrhoids.  Hemorrhoidal pain and rectal bleeding have subsided.        She has also had some vaginal discharge and vaginal itching.  Discharge is white in color.  She does not believe that she might be pregnant.  She also reports recent increase in level of fatigue.  She denies any increased muscle or joint pain other than some mid to low back discomfort.  No shortness of breath or cough, no sore throat or increased nasal congestion.  No loss of sense of taste or smell.  Past Medical History:  Diagnosis Date  . Fibromyalgia    denies today  . Genital herpes   . Hypertension    gestational  . 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 Urinary tract infection   . Uterine fibroid     Past Surgical History:  Procedure Laterality Date  . CESAREAN SECTION N/A 06/05/2014   Procedure: CESAREAN SECTION;  Surgeon: Truett Mainland, DO;  Location: [facility];  Service: Obstetrics;  Laterality: N/A;  . WISDOM TOOTH EXTRACTION      Family History  Problem Relation  Age of Onset  . Hypertension Mother   . Hypertension Father   . Breast cancer Maternal Grandmother        44  . Colon cancer Neg Hx   . Colon polyps Neg Hx   . Kidney disease Neg Hx   . Diabetes Neg Hx   . Esophageal cancer Neg Hx   . Gallbladder disease Neg Hx   . Heart disease Neg Hx   . Asthma Neg Hx   . Cancer Neg Hx   . Stroke Neg Hx     Social History   Socioeconomic History  . Marital status: Single    Spouse name: Not on file  . Number of children: Not on file  . Years of education: Not on file  . Highest education level: Not on file  Occupational History  . Occupation: [employer]  Tobacco Use  . Smoking status: Never Smoker  . Smokeless tobacco: Never Used  Substance and Sexual Activity  . Alcohol use: No  . Drug use: No  . Sexual activity: Yes    Birth control/protection: None  Other Topics Concern  . Not on file  Social History Narrative  . Not on file   Social Determinants of Health   Financial Resource Strain:   . Difficulty of Paying Living Expenses: Not on file  Food Insecurity:   . Worried About Charity fundraiser in  the Last Year: Not on file  . Ran Out of Food in the Last Year: Not on file  Transportation Needs:   . Lack of Transportation (Medical): Not on file  . Lack of Transportation (Non-Medical): Not on file  Physical Activity:   . Days of Exercise per Week: Not on file  . Minutes of Exercise per Session: Not on file  Stress:   . Feeling of Stress : Not on file  Social Connections:   . Frequency of Communication with Friends and Family: Not on file  . Frequency of Social Gatherings with Friends and Family: Not on file  . Attends Religious Services: Not on file  . Active Member of Clubs or Organizations: Not on file  . Attends Archivist Meetings: Not on file  . Marital Status: Not on file  Intimate Partner Violence:   . Fear of Current or Ex-Partner: Not on file  . Emotionally Abused: Not on file  . Physically Abused:  Not on file  . Sexually Abused: Not on file    Outpatient Medications Prior to Visit  Medication Sig Dispense Refill  . naproxen (NAPROSYN) 500 MG tablet Take 1 tablet (500 mg total) by mouth 2 (two) times daily. 30 tablet 0  . ondansetron (ZOFRAN) 4 MG tablet Take 1 tablet (4 mg total) by mouth every 8 (eight) hours as needed for nausea or vomiting. 20 tablet 0  . hydrocortisone (ANUSOL-HC) 25 MG suppository Place 1 suppository (25 mg total) rectally 2 (two) times daily. 12 suppository 0  . Hydrocortisone Acetate 1 % OINT Apply 1 application topically 2 (two) times daily as needed. 28.4 g 0  . lidocaine (XYLOCAINE) 5 % ointment Apply 1 application topically as needed. (Patient not taking: Reported on 04/06/2019) 35.44 g 0  . metroNIDAZOLE (METROGEL VAGINAL) 0.75 % vaginal gel Place 1 Applicatorful vaginally at bedtime. (Patient not taking: Reported on 03/28/2019) 70 g 0   No facility-administered medications prior to visit.    Allergies  Allergen Reactions  . Macrobid [Nitrofurantoin] Nausea And Vomiting    ROS Review of Systems  Constitutional: Positive for chills, fatigue and fever.  HENT: Negative for sore throat and trouble swallowing.   Eyes: Negative for photophobia and visual disturbance.  Respiratory: Negative for cough and shortness of breath.   Cardiovascular: Negative for chest pain and palpitations.  Gastrointestinal: Positive for abdominal pain, constipation and nausea. Negative for blood in stool and diarrhea.  Endocrine: Positive for polyuria. Negative for polydipsia and polyphagia.  Genitourinary: Positive for dysuria, frequency, vaginal discharge and vaginal pain.  Musculoskeletal: Positive for back pain. Negative for arthralgias.  Neurological: Positive for dizziness and headaches.  Hematological: Negative for adenopathy. Does not bruise/bleed easily.      Objective:    Physical Exam  Constitutional: She is oriented to person, place, and time. She appears  well-developed and well-nourished.  Overweight for height female in NAD  Cardiovascular: Normal rate and regular rhythm.  Pulmonary/Chest: Effort normal.  Abdominal: Soft. She exhibits distension. There is abdominal tenderness. There is no rebound and no guarding.  Generalized abdominal tenderness as well as increased tenderness in the epigastric area and suprapubic area  Genitourinary:    Vaginal discharge (white vaginal discharge; not clumpy;  mild erythema of vaginal canal ) present.   Musculoskeletal:        General: Tenderness (c/o bilateral mild CVA tenderness) present. No edema.  Neurological: She is alert and oriented to person, place, and time.  Skin: Skin is warm and  dry.  Psychiatric: She has a normal mood and affect. Her behavior is normal.  Nursing note and vitals reviewed.   BP (!) 139/97   Pulse 76   Temp 97.8 F (36.6 C)   Resp 12   Ht 4\' 11"  (1.499 m)   Wt 190 lb (86.2 kg)   LMP 03/18/2019 (Exact Date)   SpO2 98%   BMI 38.38 kg/m  Wt Readings from Last 3 Encounters:  04/06/19 190 lb (86.2 kg)  03/25/19 187 lb (84.8 kg)  03/09/19 187 lb (84.8 kg)     Health Maintenance Due  Topic Date Due  . PAP SMEAR-Modifier  07/17/2017    Lab Results  Component Value Date   TSH 1.180 04/06/2018   Lab Results  Component Value Date   WBC 5.8 12/02/2018   HGB 11.3 (L) 12/02/2018   HCT 36.8 12/02/2018   MCV 86.0 12/02/2018   PLT 272 12/02/2018   Lab Results  Component Value Date   NA 139 12/02/2018   K 3.7 12/02/2018   CO2 23 12/02/2018   GLUCOSE 94 12/02/2018   BUN 7 12/02/2018   CREATININE 0.77 12/02/2018   BILITOT 0.6 12/02/2018   ALKPHOS 78 12/02/2018   AST 17 12/02/2018   ALT 16 12/02/2018   PROT 7.3 12/02/2018   ALBUMIN 4.0 12/02/2018   CALCIUM 9.1 12/02/2018   ANIONGAP 6 12/02/2018   Lab Results  Component Value Date   CHOL 198 01/07/2017   Lab Results  Component Value Date   HDL 56 01/07/2017   Lab Results  Component Value Date    LDLCALC 128 (H) 01/07/2017   Lab Results  Component Value Date   TRIG 57 01/07/2017   Lab Results  Component Value Date   CHOLHDL 3.5 01/07/2017   Lab Results  Component Value Date   HGBA1C 5.1 01/06/2018      Assessment & Plan:  1. Dysuria; acute cystitis with hematuria Patient has had history of recurrent urinary tract infections and reports recent onset of dysuria and increased odor to the urine.  Patient will have urinalysis done at today's visit and will be placed on Keflex.  She is encouraged to remain well-hydrated and return in approximately 2 weeks for repeat urinalysis, sooner if continued symptoms. - POCT URINALYSIS DIP (CLINITEK) - cephALEXin (KEFLEX) 500 MG capsule; Take 1 capsule (500 mg total) by mouth 3 (three) times daily.  Dispense: 21 capsule; Refill: 0  2. Fatigue, unspecified type Patient with complaint of fatigue as well as abdominal pain, abnormal order to the urine/dysuria and vaginal discharge which were evaluated with urinalysis as well as pelvic exam with testing for vaginal infection.  She will additionally have urine pregnancy test to rule out pregnancy as a cause of her nausea and fatigue, CBC to look for anemia or elevated white blood cell count consistent with infection, T4 TSH to look for thyroid disorder, BMP to look for elevated blood sugar or electrolyte abnormality and hemoglobin A1c to look for possible prediabetes or diabetes as a contributing factor to her fatigue. - POCT urine pregnancy - CBC - T4 AND TSH - Basic Metabolic Panel - Hemoglobin A1c  3. Nausea She has a complaint of nausea as well as abdominal pain, dysuria and constipation.  Prescription sent to patient's pharmacy for Zofran to take as needed for nausea.  Urinalysis was done and she will be treated for urinary tract infection and vaginal exam also performed. - ondansetron (ZOFRAN) 4 MG tablet; Take 1 tablet (4 mg  total) by mouth every 8 (eight) hours as needed for nausea or  vomiting.  Dispense: 20 tablet; Refill: 0  4. Vaginal discharge Patient with evidence of vaginal discharge and complaint of vaginal itching.  Prescription provided for Diflucan in case rash is related to fungal infection.  Cervicovaginal ancillary testing was done for gonorrhea, chlamydia, trichomonas, bacterial vaginitis and candidal infection.  She will be notified if further treatment is needed based on the results.  Patient will also have hemoglobin A1c to check for possible diabetes as a source of her recurrent issues with vaginal infections and vaginal itching. - Cervicovaginal ancillary only - fluconazole (DIFLUCAN) 150 MG tablet; Take 1 tablet (150 mg total) by mouth once for 1 dose. Take a second dose in 3 days  Dispense: 2 tablet; Refill: 0 - Hemoglobin A1c  5. Chronic constipation Prescription provided for generic MiraLAX for patient to use once daily to help with her issues with chronic constipation.  We will also check T4 TSH to look for hyperthyroidism as a contributing factor to her constipation.  She is also encouraged to increase water and fiber intake in the diet.  If her abdominal pain and/or constipation continues then she should call or return and she will also need referral to gastroenterology. - polyethylene glycol powder (GLYCOLAX/MIRALAX) 17 GM/SCOOP powder; Mix one scoop 17 gm with water and mix with 8 or more ounces of liquid once per day  Dispense: 578 g; Refill: 11 - T4 AND TSH  6. Generalized abdominal pain Most likely due to a combination of UTI and constipation. Will have patient return in 1-2 weeks for reevaluation. ED if symptoms acutely worsen. Will check CBC, BMP and urine preg in addition to treating constipation and Abx for UTI while urine culture is pending. Pelvic exam done and cervicovaginal testing for infection pending.  - CBC - Basic Metabolic Panel   An After Visit Summary was printed and given to the patient.  More than 30 minutes of face-to-face  time was spent with the patient at today's visit including obtaining history, performing examination and discussing medical issues and treatment.   Follow-up: Return in about 2 weeks (around 04/20/2019) for abdominal pain; 1-2 weeks if not better.   Antony Blackbird, MD

## 2019-04-06 NOTE — Progress Notes (Signed)
  Malodorous urine and dysuria x 1 week.  Fatigue and nausea Vaginal pain

## 2019-04-07 ENCOUNTER — Other Ambulatory Visit: Payer: Self-pay | Admitting: Family Medicine

## 2019-04-07 ENCOUNTER — Ambulatory Visit (INDEPENDENT_AMBULATORY_CARE_PROVIDER_SITE_OTHER): Payer: Self-pay | Admitting: Urology

## 2019-04-07 ENCOUNTER — Encounter: Payer: Self-pay | Admitting: Urology

## 2019-04-07 VITALS — BP 138/82 | HR 82 | Ht 59.0 in | Wt 190.0 lb

## 2019-04-07 DIAGNOSIS — R350 Frequency of micturition: Secondary | ICD-10-CM

## 2019-04-07 DIAGNOSIS — R300 Dysuria: Secondary | ICD-10-CM

## 2019-04-07 DIAGNOSIS — R102 Pelvic and perineal pain: Secondary | ICD-10-CM

## 2019-04-07 LAB — URINALYSIS, COMPLETE
Bilirubin, UA: NEGATIVE
Glucose, UA: NEGATIVE
Ketones, UA: NEGATIVE
Leukocytes,UA: NEGATIVE
Nitrite, UA: NEGATIVE
Protein,UA: NEGATIVE
Specific Gravity, UA: 1.02 (ref 1.005–1.030)
Urobilinogen, Ur: 0.2 mg/dL (ref 0.2–1.0)
pH, UA: 6 (ref 5.0–7.5)

## 2019-04-07 LAB — BASIC METABOLIC PANEL WITH GFR
BUN/Creatinine Ratio: 12 (ref 9–23)
BUN: 11 mg/dL (ref 6–20)
CO2: 21 mmol/L (ref 20–29)
Calcium: 9.7 mg/dL (ref 8.7–10.2)
Chloride: 106 mmol/L (ref 96–106)
Creatinine, Ser: 0.89 mg/dL (ref 0.57–1.00)
GFR calc Af Amer: 99 mL/min/1.73
GFR calc non Af Amer: 86 mL/min/1.73
Glucose: 81 mg/dL (ref 65–99)
Potassium: 4.8 mmol/L (ref 3.5–5.2)
Sodium: 140 mmol/L (ref 134–144)

## 2019-04-07 LAB — CERVICOVAGINAL ANCILLARY ONLY
Bacterial Vaginitis (gardnerella): POSITIVE — AB
Candida Glabrata: NEGATIVE
Candida Vaginitis: NEGATIVE
Chlamydia: NEGATIVE
Comment: NEGATIVE
Comment: NEGATIVE
Comment: NEGATIVE
Comment: NEGATIVE
Comment: NEGATIVE
Comment: NORMAL
Neisseria Gonorrhea: NEGATIVE
Trichomonas: NEGATIVE

## 2019-04-07 LAB — MICROSCOPIC EXAMINATION
Bacteria, UA: NONE SEEN
WBC, UA: NONE SEEN /hpf (ref 0–5)

## 2019-04-07 LAB — T4 AND TSH
T4, Total: 7.5 ug/dL (ref 4.5–12.0)
TSH: 0.926 u[IU]/mL (ref 0.450–4.500)

## 2019-04-07 LAB — CBC
Hematocrit: 37 % (ref 34.0–46.6)
Hemoglobin: 11.6 g/dL (ref 11.1–15.9)
MCH: 26.1 pg — ABNORMAL LOW (ref 26.6–33.0)
MCHC: 31.4 g/dL — ABNORMAL LOW (ref 31.5–35.7)
MCV: 83 fL (ref 79–97)
Platelets: 317 x10E3/uL (ref 150–450)
RBC: 4.45 x10E6/uL (ref 3.77–5.28)
RDW: 15.3 % (ref 11.7–15.4)
WBC: 8.1 x10E3/uL (ref 3.4–10.8)

## 2019-04-07 LAB — HEMOGLOBIN A1C
Est. average glucose Bld gHb Est-mCnc: 120 mg/dL
Hgb A1c MFr Bld: 5.8 % — ABNORMAL HIGH (ref 4.8–5.6)

## 2019-04-07 MED ORDER — SOLIFENACIN SUCCINATE 5 MG PO TABS: 5.0000 mg | ORAL_TABLET | Freq: Every day | ORAL | 1 refills | Status: DC

## 2019-04-07 MED ORDER — METRONIDAZOLE 500 MG PO TABS: 500.0000 mg | ORAL_TABLET | Freq: Two times a day (BID) | ORAL | 0 refills | Status: DC

## 2019-04-07 MED FILL — SOLIFENACIN SUCCINATE 5 MG: 5 | 30 days supply | Qty: 30 | Fill #0

## 2019-04-07 NOTE — Progress Notes (Signed)
   04/07/19  CC:  Chief Complaint  Patient presents with  . Cysto    HPI: 34 YO female seen 03/08/2019 with complaints of urinary frequency, urgency, dysuria, dyspareunia, low back and pelvic pain.  She was given a trial of Uribel initially which she was unable to tolerate and discontinued.  Her symptoms have not changed.  Blood pressure 138/82, pulse 82, height 4\' 11"  (1.499 m), weight 190 lb (86.2 kg), last menstrual period 03/18/2019. NED. A&Ox3.   No respiratory distress   Abd soft, NT, ND Normal external genitalia with patent urethral meatus; bimanual exam remarkable for base of bladder tenderness and marked pelvic floor tenderness  Cystoscopy Procedure Note  Patient identification was confirmed, informed consent was obtained, and patient was prepped using Betadine solution.  Lidocaine jelly was administered per urethral meatus.    Procedure: - Flexible cystoscope introduced, without any difficulty.   - Thorough search of the bladder revealed:    normal urethral meatus    normal urothelium    no stones    no ulcers     no tumors    no urethral polyps    no trabeculation  - Ureteral orifices were normal in position and appearance.  Post-Procedure: - Patient tolerated the procedure well  Assessment/ Plan: -No significant abnormalities on cystoscopy -We discussed potential etiologies including interstitial cystitis/painful bladder -We will start an anticholinergic medication and have her follow-up with [name] in approximately 4 weeks for symptom reassessment. -If no improvement would consider adding amitriptyline -We also discussed hydrodistention -She does have fairly significant pelvic floor tenderness and has not seen her gynecologist for the symptoms   Abbie Sons, MD

## 2019-04-10 ENCOUNTER — Other Ambulatory Visit: Payer: Self-pay | Admitting: Family Medicine

## 2019-04-10 ENCOUNTER — Ambulatory Visit: Payer: Self-pay | Admitting: Family Medicine

## 2019-04-10 DIAGNOSIS — B9689 Other specified bacterial agents as the cause of diseases classified elsewhere: Secondary | ICD-10-CM

## 2019-04-10 MED ORDER — METRONIDAZOLE 0.75 % VA GEL: 1.0000 | Freq: Two times a day (BID) | VAGINAL | 0 refills | Status: DC

## 2019-04-10 NOTE — Progress Notes (Signed)
  Patient with evidence of bacterial vaginitis on recent cervicovaginal ancillary testing and she was notified that prescription for metronidazole will be sent to pharmacy.  When she was contacted by nurse with this information, she requested MetroGel instead as the metronidazole pills tend to cause her to have nausea.  New prescription for MetroGel will be sent to patient's pharmacy

## 2019-04-11 MED FILL — metroNIDAZOLE 0.75 % GEL: 0.75 | 5 days supply | Qty: 70 | Fill #0

## 2019-04-18 MED FILL — SOLIFENACIN SUCCINATE 5 MG: 5 | 30 days supply | Qty: 30 | Fill #0

## 2019-04-20 ENCOUNTER — Other Ambulatory Visit: Payer: Self-pay

## 2019-04-20 ENCOUNTER — Ambulatory Visit: Payer: Self-pay | Attending: Family Medicine | Admitting: Family Medicine

## 2019-04-20 ENCOUNTER — Encounter: Payer: Self-pay | Admitting: Family Medicine

## 2019-04-20 DIAGNOSIS — N898 Other specified noninflammatory disorders of vagina: Secondary | ICD-10-CM

## 2019-04-20 DIAGNOSIS — K5909 Other constipation: Secondary | ICD-10-CM

## 2019-04-20 DIAGNOSIS — R300 Dysuria: Secondary | ICD-10-CM

## 2019-04-20 DIAGNOSIS — R198 Other specified symptoms and signs involving the digestive system and abdomen: Secondary | ICD-10-CM

## 2019-04-20 DIAGNOSIS — R110 Nausea: Secondary | ICD-10-CM

## 2019-04-20 DIAGNOSIS — Z8742 Personal history of other diseases of the female genital tract: Secondary | ICD-10-CM

## 2019-04-20 MED ORDER — METRONIDAZOLE 0.75 % VA GEL: 1.0000 | Freq: Two times a day (BID) | VAGINAL | 0 refills | Status: DC

## 2019-04-20 MED ORDER — ONDANSETRON HCL 4 MG PO TABS: 4.0000 mg | ORAL_TABLET | Freq: Three times a day (TID) | ORAL | 0 refills | Status: DC | PRN

## 2019-04-20 MED ORDER — LANSOPRAZOLE 30 MG PO CPDR: DELAYED_RELEASE_CAPSULE | ORAL | 3 refills | Status: DC

## 2019-04-20 MED FILL — LANSOPRAZOLE DR 30 MG CAPSU: 30 | 30 days supply | Qty: 30 | Fill #0

## 2019-04-20 MED FILL — ONDANSETRON HCL 4 MG TABLET: 4 | 6 days supply | Qty: 20 | Fill #0

## 2019-04-20 MED FILL — metroNIDAZOLE 0.75 % GEL: 0.75 | 2 days supply | Qty: 70 | Fill #0

## 2019-04-20 NOTE — Progress Notes (Signed)
Virtual Visit via Telephone Note  I connected with Sherri [name] on 04/20/19 at  1:30 PM EST by telephone and verified that I am speaking with the correct person using two identifiers.   I discussed the limitations, risks, security and privacy concerns of performing an evaluation and management service by telephone and the availability of in person appointments. I also discussed with the patient that there may be a patient responsible charge related to this service. The patient expressed understanding and agreed to proceed.  Patient Location: Home Provider Location: [facility] Others participating in call: None   History of Present Illness:       34 yo female seen in follow-up of recent visit at which time she had complaints of chronic constipation as well as vaginal discharge.  Patient was provided with prescription for MiraLAX which she has been taking and additionally prescribed metronidazole as she had results consistent with bacterial vaginitis for which she has completed treatment with vaginal gel as she does not tolerate oral metronidazole.  She reports that since her last visit, she has had return of vaginal discharge with odor status post treatment for bacterial vaginitis.  She continues to have issues with constipation.  She reports that she does not have a daily bowel movement but may have 1 every 2 to 3 days with use of MiraLAX.  Stool is softer with use of MiraLAX however she continues to have pain with defecation.  Pain is similar to pain that she has had in the past with hemorrhoids however she does not have any palpable external hemorrhoids and has seen no bleeding/blood on the toilet paper with wiping or blood in the toilet.  She has seen no black stools.  She also continues to have burning with urination.  She does have upcoming urology appointment but this is in early March.  She would like a refill of Zofran as she continues to have nausea each morning.  She denies any  possibility of pregnancy.  She does not feel as if she is having any increased frequency of urination, just the dysuria.  No current abdominal pain.  No chest pain or palpitations, no shortness of breath or [name], no headaches or dizziness and no fever or chills.  She does feel fatigued.   Past Medical History:  Diagnosis Date  . Fibromyalgia    denies today  . Genital herpes   . Hypertension    gestational  . 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 Urinary tract infection   . Uterine fibroid     Past Surgical History:  Procedure Laterality Date  . CESAREAN SECTION N/A 06/05/2014   Procedure: CESAREAN SECTION;  Surgeon: Truett Mainland, DO;  Location: [facility];  Service: Obstetrics;  Laterality: N/A;  . WISDOM TOOTH EXTRACTION      Family History  Problem Relation Age of Onset  . Hypertension Mother   . Hypertension Father   . Breast cancer Maternal Grandmother        27  . Colon cancer Neg Hx   . Colon polyps Neg Hx   . Kidney disease Neg Hx   . Diabetes Neg Hx   . Esophageal cancer Neg Hx   . Gallbladder disease Neg Hx   . Heart disease Neg Hx   . Asthma Neg Hx   . Cancer Neg Hx   . Stroke Neg Hx     Social History   Tobacco Use  . Smoking status: Never Smoker  .  Smokeless tobacco: Never Used  Substance Use Topics  . Alcohol use: No  . Drug use: No     Allergies  Allergen Reactions  . Macrobid [Nitrofurantoin] Nausea And Vomiting       Observations/Objective: No vital signs or physical exam conducted as visit was done via telephone  Assessment and Plan: 1. Chronic constipation; 3.  Pain associated with defecation She is encouraged to continue the use of MiraLAX, continue increase water and fiber intake.  Referral will be made to gastroenterology for further follow-up of her chronic constipation and pain with defecation.  She does have history of external hemorrhoids but reports no current indication of hemorrhoidal flareup. - Ambulatory  referral to Gastroenterology  2. Nausea Will provide refill of Zofran.  We will add lansoprazole 30 mg daily.  GI referral placed. - Ambulatory referral to Gastroenterology -Lansoprazole 30 mg once daily to reduce stomach acid #30; refill 3  4. Vaginal discharge 5. History of recurrent vaginal discharge She will be referred to GYN for further follow-up of recurrent vaginal discharge.  She has seen gynecology in the past regarding this issue.  She request retreatment with metronidazole MetroGel which will be sent to pharmacy. -Ambulatory referral to gynecology  6. Burning with urination This is been a longstanding issue for the patient and she has been seen and evaluated by urology.  She has been asked to keep upcoming urology appointment but to call the office for advice regarding current symptoms or to see if sooner appointment is available due to her continued symptoms.  Follow Up Instructions: 1 to 2 weeks if symptoms have not improved or if not seen by specialist in follow-up; urgent care if acute worsening or any concerns   I discussed the assessment and treatment plan with the patient. The patient was provided an opportunity to ask questions and all were answered. The patient agreed with the plan and demonstrated an understanding of the instructions.   The patient was advised to call back or seek an in-person evaluation if the symptoms worsen or if the condition fails to improve as anticipated.  I provided 14 minutes of non-face-to-face time during this encounter.   Antony Blackbird, MD

## 2019-04-20 NOTE — Progress Notes (Signed)
Patient verified DOB Patient has eaten today. Patient has not taken medication today. Patient still complains of constant burning sensation with discharge with white color no smell. Patient complains of straining with stools, patient tastes bowels are soft but painful when releasing. Patient complains of being nauseated every morning. Patient complains of miralax not aiding BM reliefs

## 2019-04-24 ENCOUNTER — Encounter: Payer: Self-pay | Admitting: Nurse Practitioner

## 2019-04-24 ENCOUNTER — Ambulatory Visit (INDEPENDENT_AMBULATORY_CARE_PROVIDER_SITE_OTHER): Payer: Self-pay | Admitting: Nurse Practitioner

## 2019-04-24 VITALS — BP 120/80 | HR 82 | Temp 97.4°F | Ht 59.0 in | Wt 190.0 lb

## 2019-04-24 DIAGNOSIS — R112 Nausea with vomiting, unspecified: Secondary | ICD-10-CM

## 2019-04-24 DIAGNOSIS — K5900 Constipation, unspecified: Secondary | ICD-10-CM

## 2019-04-24 DIAGNOSIS — R1084 Generalized abdominal pain: Secondary | ICD-10-CM

## 2019-04-24 NOTE — Patient Instructions (Signed)
If you are age 33 or older, your body mass index should be between 23-30. Your Body mass index is 38.38 kg/m. If this is out of the aforementioned range listed, please consider follow up with your Primary Care Provider.  If you are age 10 or younger, your body mass index should be between 19-25. Your Body mass index is 38.38 kg/m. If this is out of the aformentioned range listed, please consider follow up with your Primary Care Provider.   Increase water of 64 ounces daily.  Start Benefiber daily.  This is over-the-counter.  You have been given samples of Amitiza 24 mcg to be taken twice daily.  Can take Miralax twice daily if can't afford Amitiza.   Please call if you would like a prescription for Amitiza.  Thank you for choosing me and [facility].   Tye Savoy, NP

## 2019-04-24 NOTE — Progress Notes (Signed)
Reviewed and agree with management plan.  Jackqueline Aquilar T. Savanah Bayles, MD FACG [facility]  

## 2019-04-24 NOTE — Progress Notes (Signed)
Referring provider: Antony Blackbird, MD Reason for referral: Constipation and abdominal pain  ASSESSMENT / PLAN:    34 yo female with pmh significant for but not limited to fibromyalgia, hypertension, chronic constipation  # chronic constipation.  -She has tried MiraLAX but generally only takes it for about a week at the time.  -Increase water intake to 64 ounces daily -Start daily Benefiber. -She is interested in a Rx for constipation.  She is self-pay, we checked on prices of various medications.  Amitiza seems to be the less expensive but is still over $250 a month.  Samples given, if it helps she will see if purchasing Amitiza is an option.  -If Amitiza not helpful or unable to purchase then she will resume MiraLAX.  She can take it twice daily but I did ask her to be diligent in taking it every day  # Nausea, generalized abdominal pain.  -Etiology unclear but need to take constipation out of the equation -She will return to clinic in 4 to 5 weeks.  If symptoms persist despite resolution of constipation then will proceed with further work-up -Frequent labs from multiple ED visits for various reasons have been unremarkable.  Hemoglobin consistently in the mid 11 range. Normal Liver tests, lipase in October.   HPI:     Chief Complaint: Constipation , nausea and abdominal pain  Sherri [name] is a 34 y.o. female seen by Dr. Fuller Plan in 2016 for evaluation of epigastric pain, heartburn and constipation.  She has been off and on MiraLAX since then but usually takes it for only a week at the time.  Softeners have not been of benefit. . She drinks 3 bottles of 16 ounce water a day.  Her diet is not rich in fiber. She describes constipation as hard stool and increased frequency averaging no more than 1-2 bowel movements every 2 weeks.  She has frequent nausea, wakes up almost every day with it.  She describes frequent episodes of generalized abdominal pain unrelated to eating or bowel movements.   Tylenol helps with the pain.   Data Reviewed:   Labs earlier this month including a BMP and CBC were at baseline and fairly unremarkable.  Her hemoglobin stays in the mid 11 range.  TSH normal.  Negative pregnancy test.  Her liver test in October 2020 were unremarkable.   December 2019 RUQ ultrasound for nausea was normal. October 2018 abdominal ultrasound for epigastric pain- normal  Past Medical History:  Diagnosis Date  . Fibromyalgia    denies today  . Genital herpes   . Hypertension    gestational  . 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 Urinary tract infection   . Uterine fibroid      Past Surgical History:  Procedure Laterality Date  . CESAREAN SECTION N/A 06/05/2014   Procedure: CESAREAN SECTION;  Surgeon: Truett Mainland, DO;  Location: [facility];  Service: Obstetrics;  Laterality: N/A;  . WISDOM TOOTH EXTRACTION     Family History  Problem Relation Age of Onset  . Hypertension Mother   . Hypertension Father   . Breast cancer Maternal Grandmother        79  . Colon cancer Neg Hx   . Colon polyps Neg Hx   . Kidney disease Neg Hx   . Diabetes Neg Hx   . Esophageal cancer Neg Hx   . Gallbladder disease Neg Hx   . Heart disease Neg Hx   . Asthma Neg  Hx   . Cancer Neg Hx   . Stroke Neg Hx    Social History   Tobacco Use  . Smoking status: Never Smoker  . Smokeless tobacco: Never Used  Substance Use Topics  . Alcohol use: No  . Drug use: No   Current Outpatient Medications  Medication Sig Dispense Refill  . lansoprazole (PREVACID) 30 MG capsule 1 pill, once daily to reduce stomach acid 30 capsule 3  . naproxen (NAPROSYN) 500 MG tablet Take 1 tablet (500 mg total) by mouth 2 (two) times daily. 30 tablet 0  . ondansetron (ZOFRAN) 4 MG tablet Take 1 tablet (4 mg total) by mouth every 8 (eight) hours as needed for nausea or vomiting. 20 tablet 0  . polyethylene glycol powder (GLYCOLAX/MIRALAX) 17 GM/SCOOP powder Mix one scoop 17 gm with  water and mix with 8 or more ounces of liquid once per day 578 g 11  . solifenacin (VESICARE) 5 MG tablet Take 1 tablet (5 mg total) by mouth daily. 30 tablet 1   No current facility-administered medications for this visit.   Allergies  Allergen Reactions  . Macrobid [Nitrofurantoin] Nausea And Vomiting     Review of Systems: Positive for fatigue, headaches, menstrual pain, muscle pain and cramps and frequent urination . All other systems reviewed and negative except where noted in HPI.   Serum creatinine: 0.89 mg/dL 04/06/19 1437 Estimated creatinine clearance: 86.5 mL/min   Physical Exam:    Wt Readings from Last 3 Encounters:  04/24/19 190 lb (86.2 kg)  04/07/19 190 lb (86.2 kg)  04/06/19 190 lb (86.2 kg)    BP 120/80   Pulse 82   Temp (!) 97.4 F (36.3 C)   Ht 4\' 11"  (1.499 m)   Wt 190 lb (86.2 kg)   LMP 04/14/2019   BMI 38.38 kg/m  Constitutional:  Pleasant female in no acute distress. Psychiatric: Normal mood and affect. Behavior is normal. EENT: Pupils normal.  Conjunctivae are normal. No scleral icterus. Neck supple.  Cardiovascular: Normal rate, regular rhythm. No edema Pulmonary/chest: Effort normal and breath sounds normal. No wheezing, rales or rhonchi. Abdominal: Soft, nondistended, generalized tenderness to light palpation.  Bowel sounds active throughout. There are no masses palpable. No hepatomegaly. Neurological: Alert and oriented to person place and time. Skin: Skin is warm and dry. No rashes noted.  Tye Savoy, NP  04/24/2019, 10:30 AM    Cc:  Referring Provider Antony Blackbird, MD

## 2019-04-24 NOTE — Progress Notes (Signed)
Reviewed and agree with management plan.  Ayomide Purdy T. Gibson Telleria, MD FACG [facility]  

## 2019-04-27 ENCOUNTER — Other Ambulatory Visit: Payer: Self-pay

## 2019-04-27 ENCOUNTER — Telehealth: Payer: Self-pay | Admitting: Nurse Practitioner

## 2019-04-27 MED ORDER — LUBIPROSTONE 24 MCG PO CAPS: 24.0000 ug | ORAL_CAPSULE | Freq: Two times a day (BID) | ORAL | 3 refills | Status: DC

## 2019-04-27 NOTE — Telephone Encounter (Signed)
Prescription sent top [facility].  Pt aware.

## 2019-04-27 NOTE — Telephone Encounter (Signed)
Pt is requesting prescription for amatiza to be sent to [facility].

## 2019-05-02 ENCOUNTER — Ambulatory Visit
Admission: EM | Admit: 2019-05-02 | Discharge: 2019-05-02 | Disposition: A | Discharge status: Home / Self Care | Payer: Self-pay | Attending: Physician Assistant | Admitting: Physician Assistant

## 2019-05-02 DIAGNOSIS — Z20822 Contact with and (suspected) exposure to covid-19: Secondary | ICD-10-CM

## 2019-05-02 DIAGNOSIS — H938X3 Other specified disorders of ear, bilateral: Secondary | ICD-10-CM

## 2019-05-02 DIAGNOSIS — J3489 Other specified disorders of nose and nasal sinuses: Secondary | ICD-10-CM

## 2019-05-02 DIAGNOSIS — R52 Pain, unspecified: Secondary | ICD-10-CM

## 2019-05-02 LAB — POCT URINALYSIS DIP (MANUAL ENTRY)
Bilirubin, UA: NEGATIVE
Glucose, UA: NEGATIVE mg/dL
Ketones, POC UA: NEGATIVE mg/dL
Leukocytes, UA: NEGATIVE
Nitrite, UA: NEGATIVE
Protein Ur, POC: NEGATIVE mg/dL
Spec Grav, UA: 1.015 (ref 1.010–1.025)
Urobilinogen, UA: 0.2 E.U./dL
pH, UA: 7 (ref 5.0–8.0)

## 2019-05-02 LAB — POCT URINE PREGNANCY: Preg Test, Ur: NEGATIVE

## 2019-05-02 MED ORDER — PREDNISONE 50 MG PO TABS: 50.0000 mg | ORAL_TABLET | Freq: Every day | ORAL | 0 refills | Status: DC

## 2019-05-02 MED ORDER — AZELASTINE HCL 0.1 % NA SOLN: 2.0000 | Freq: Two times a day (BID) | NASAL | 0 refills | Status: DC

## 2019-05-02 MED ORDER — FLUTICASONE PROPIONATE 50 MCG/ACT NA SUSP: 2.0000 | Freq: Every day | NASAL | 0 refills | Status: DC

## 2019-05-02 NOTE — ED Triage Notes (Signed)
Pt c/o urinary burning and frequency x3 days. Pt c/o bilateral ear pain and fullness x3 days.

## 2019-05-02 NOTE — Discharge Instructions (Addendum)
COVID PCR testing ordered. I would like you to quarantine until testing results. Prednisone, flonase, azelastine as directed. Tylenol/motrin for pain and fever. Keep hydrated, urine should be clear to pale yellow in color. If experiencing shortness of breath, trouble breathing, go to the emergency department for further evaluation needed.   Please follow up with your urologist for painful urination.

## 2019-05-02 NOTE — ED Provider Notes (Addendum)
[facility]    CSN: [id number] Arrival date & time: 05/02/19  1530      History   Chief Complaint Chief Complaint  Patient presents with  . Urinary Tract Infection    HPI Sherri [name] is a 34 y.o. female.   34 year old female comes in for multiple complaints.  1.  3-day history of URI symptoms/ear fullness.  Has had rhinorrhea, body aches, ear pain and fullness.  Denies cough.  Denies fever, chills, body aches.  Denies abdominal pain, nausea, vomiting.  Denies shortness of breath, loss of taste or smell.  Has not tried anything for the symptoms.  2.  Dysuria with urinary frequency.  This is a chronic condition, is currently seeing urologist.  States was put on a medication, but discontinued due to side effects.  Has not contacted urologist for this change.  Denies worsening of symptoms.  Denies fever, chills, flank pain.  Denies vaginal symptoms such as discharge, itching.     Past Medical History:  Diagnosis Date  . Fibromyalgia    denies today  . Genital herpes   . Hypertension    gestational  . 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 Urinary tract infection   . Uterine fibroid     Patient Active Problem List   Diagnosis Date Noted  . Foul smelling vaginal discharge 03/09/2019  . Chronic constipation 03/09/2019  . Pelvic pain in female 03/09/2019  . Exposure to HIV 02/02/2019  . Hypertension   . Genital herpes   . Yeast infection 12/03/2016  . Status post cesarean delivery 06/06/2014  . Uterine fibroid 12/25/2013  . Obesity 12/25/2013  . Screen for STD (sexually transmitted disease) 04/27/2012    Past Surgical History:  Procedure Laterality Date  . CESAREAN SECTION N/A 06/05/2014   Procedure: CESAREAN SECTION;  Surgeon: Truett Mainland, DO;  Location: [facility];  Service: Obstetrics;  Laterality: N/A;  . WISDOM TOOTH EXTRACTION      OB History    Gravida  1   Para  1   Term      Preterm  1   AB      Living    2     SAB      TAB      Ectopic      Multiple  1   Live Births  2            Home Medications    Prior to Admission medications   Medication Sig Start Date End Date Taking? Authorizing Provider  azelastine (ASTELIN) 0.1 % nasal spray Place 2 sprays into both nostrils 2 (two) times daily. 05/02/19   Tasia Catchings, Milanna Kozlov V, PA-C  fluticasone (FLONASE) 50 MCG/ACT nasal spray Place 2 sprays into both nostrils daily. 05/02/19   Ok Edwards, PA-C  lansoprazole (PREVACID) 30 MG capsule 1 pill, once daily to reduce stomach acid 04/20/19   Fulp, Cammie, MD  lubiprostone (AMITIZA) 24 MCG capsule Take 1 capsule (24 mcg total) by mouth 2 (two) times daily with a meal. 04/27/19   Willia Craze, NP  naproxen (NAPROSYN) 500 MG tablet Take 1 tablet (500 mg total) by mouth 2 (two) times daily. 03/25/19   Alveria Apley, PA-C  ondansetron (ZOFRAN) 4 MG tablet Take 1 tablet (4 mg total) by mouth every 8 (eight) hours as needed for nausea or vomiting. 04/20/19   Fulp, Cammie, MD  polyethylene glycol powder (GLYCOLAX/MIRALAX) 17 GM/SCOOP powder Mix one scoop 17 gm  with water and mix with 8 or more ounces of liquid once per day 04/06/19   Fulp, Cammie, MD  predniSONE (DELTASONE) 50 MG tablet Take 1 tablet (50 mg total) by mouth daily with breakfast. 05/02/19   Tasia Catchings, Ysabel Cowgill V, PA-C  solifenacin (VESICARE) 5 MG tablet Take 1 tablet (5 mg total) by mouth daily. 04/07/19   Stoioff, Ronda Fairly, MD  esomeprazole (NEXIUM) 40 MG capsule Take 1 capsule (40 mg total) by mouth daily. To reduce stomach acid 03/07/18 04/20/19  Antony Blackbird, MD    Family History Family History  Problem Relation Age of Onset  . Hypertension Mother   . Hypertension Father   . Breast cancer Maternal Grandmother        70  . Colon cancer Neg Hx   . Colon polyps Neg Hx   . Kidney disease Neg Hx   . Diabetes Neg Hx   . Esophageal cancer Neg Hx   . Gallbladder disease Neg Hx   . Heart disease Neg Hx   . Asthma Neg Hx   . Cancer Neg Hx   . Stroke Neg Hx      Social History Social History   Tobacco Use  . Smoking status: Never Smoker  . Smokeless tobacco: Never Used  Substance Use Topics  . Alcohol use: No  . Drug use: No     Allergies   Macrobid [nitrofurantoin]   Review of Systems Review of Systems  Reason unable to perform ROS: See HPI as above.     Physical Exam Triage Vital Signs ED Triage Vitals  Enc Vitals Group     BP 05/02/19 1548 (!) 142/84     Pulse Rate 05/02/19 1548 72     Resp 05/02/19 1548 16     Temp 05/02/19 1548 98.3 F (36.8 C)     Temp Source 05/02/19 1548 Oral     SpO2 05/02/19 1548 98 %     Weight --      Height --      Head Circumference --      Peak Flow --      Pain Score 05/02/19 1556 9     Pain Loc --      Pain Edu? --      Excl. in [place]? --    No data found.  Updated Vital Signs BP (!) 142/84 (BP Location: Right Arm)   Pulse 72   Temp 98.3 F (36.8 C) (Oral)   Resp 16   LMP 04/14/2019   SpO2 98%   Physical Exam Constitutional:      General: She is not in acute distress.    Appearance: Normal appearance. She is not ill-appearing, toxic-appearing or diaphoretic.  HENT:     Head: Normocephalic and atraumatic.     Right Ear: Ear canal and external ear normal. A middle ear effusion is present. Tympanic membrane is not erythematous or bulging.     Left Ear: Ear canal and external ear normal. A middle ear effusion is present. Tympanic membrane is not erythematous or bulging.     Nose:     Right Sinus: Maxillary sinus tenderness and frontal sinus tenderness present.     Left Sinus: Maxillary sinus tenderness and frontal sinus tenderness present.     Mouth/Throat:     Mouth: Mucous membranes are moist.     Pharynx: Oropharynx is clear. Uvula midline.  Cardiovascular:     Rate and Rhythm: Normal rate and regular rhythm.     Heart  sounds: Normal heart sounds. No murmur. No friction rub. No gallop.   Pulmonary:     Effort: Pulmonary effort is normal. No accessory muscle usage,  prolonged expiration, respiratory distress or retractions.     Comments: Lungs clear to auscultation without adventitious lung sounds. Musculoskeletal:     Cervical back: Normal range of motion and neck supple.  Neurological:     General: No focal deficit present.     Mental Status: She is alert and oriented to person, place, and time.      UC Treatments / Results  Labs (all labs ordered are listed, but only abnormal results are displayed) Labs Reviewed  POCT URINALYSIS DIP (MANUAL ENTRY) - Abnormal; Notable for the following components:      Result Value   Blood, UA trace-lysed (*)    All other components within normal limits  POCT URINE PREGNANCY - Normal  NOVEL CORONAVIRUS, NAA    EKG   Radiology No results found.  Procedures Procedures (including critical care time)  Medications Ordered in UC Medications - No data to display  Initial Impression / Assessment and Plan / UC Course  I have reviewed the triage vital signs and the nursing notes.  Pertinent labs & imaging results that were available during my care of the patient were reviewed by me and considered in my medical decision making (see chart for details).    COVID PCR test ordered. Patient to quarantine until testing results return. No alarming signs on exam.  Patient speaking in full sentences without respiratory distress.  Symptomatic treatment discussed.  Push fluids.  Return precautions given.  Patient expresses understanding and agrees to plan.  Workup for patient's dysuria deferred as already being managed by urology without worsening symptoms. Dipstick ran prior to my evaluation, with trace blood. Discussed results with patient and to mention to urologist. Patient expresses understanding and will follow up with urology for further evaluation needed.   Final Clinical Impressions(s) / UC Diagnoses   Final diagnoses:  Rhinorrhea  Body aches  Sensation of fullness in both ears   ED Prescriptions     Medication Sig Dispense Auth. Provider   fluticasone (FLONASE) 50 MCG/ACT nasal spray Place 2 sprays into both nostrils daily. 1 g Yassen Kinnett V, PA-C   azelastine (ASTELIN) 0.1 % nasal spray Place 2 sprays into both nostrils 2 (two) times daily. 30 mL Aneliz Carbary V, PA-C   predniSONE (DELTASONE) 50 MG tablet Take 1 tablet (50 mg total) by mouth daily with breakfast. 5 tablet Ok Edwards, PA-C     PDMP not reviewed this encounter.   Ok Edwards, PA-C 05/02/19 1911    Ok Edwards, PA-C 05/02/19 1911

## 2019-05-03 LAB — NOVEL CORONAVIRUS, NAA: SARS-CoV-2, NAA: NOT DETECTED

## 2019-05-03 MED FILL — predniSONE 10 MG TABS: 10 | 5 days supply | Qty: 25 | Fill #0

## 2019-05-09 ENCOUNTER — Encounter: Payer: Self-pay | Admitting: Urology

## 2019-05-09 ENCOUNTER — Other Ambulatory Visit: Payer: Self-pay

## 2019-05-09 ENCOUNTER — Ambulatory Visit
Admission: RE | Admit: 2019-05-09 | Discharge: 2019-05-09 | Disposition: A | Discharge status: Home / Self Care | Payer: Self-pay | Source: Ambulatory Visit | Attending: Urology | Admitting: Urology

## 2019-05-09 ENCOUNTER — Ambulatory Visit (INDEPENDENT_AMBULATORY_CARE_PROVIDER_SITE_OTHER): Payer: Self-pay | Admitting: Urology

## 2019-05-09 VITALS — BP 136/86 | HR 106 | Ht 59.0 in | Wt 187.8 lb

## 2019-05-09 DIAGNOSIS — N3281 Overactive bladder: Secondary | ICD-10-CM

## 2019-05-09 DIAGNOSIS — R102 Pelvic and perineal pain: Secondary | ICD-10-CM

## 2019-05-09 DIAGNOSIS — K5909 Other constipation: Secondary | ICD-10-CM

## 2019-05-09 DIAGNOSIS — R300 Dysuria: Secondary | ICD-10-CM

## 2019-05-09 LAB — BLADDER SCAN AMB NON-IMAGING: Scan Result: 20

## 2019-05-09 MED ORDER — MIRABEGRON ER 25 MG PO TB24: 25.0000 mg | ORAL_TABLET | Freq: Every day | ORAL | 0 refills | Status: DC

## 2019-05-09 NOTE — Progress Notes (Signed)
05/09/2019 11:07 AM   Sherri [name] 08-Feb-1986 [id number]  Referring provider: Antony Blackbird, MD [place],  [place] [address]  Chief Complaint  Patient presents with  . Pelvic Pain    HPI: Sherri [name] is a 34 year old female with OAB and pelvic pain who presents today after a trial of Vesicare 5 mg daily.  She was seen by Dr. Bernardo Heater for the complaint of dysuria and hematuria.  She has associated frequency, urgency, dyspareunia, low back pain and pelvic pain.  The hematuria was only noted on positive dip.  She does not endorse gross hematuria and there has been no microscopic evidence of hematuria.  She has had a trial of Bactrim and Diflucan, Naprosyn and Zofran, Uribel and Vesicare 5 mg daily for her symptoms without relief.    Cysto 04/07/2019 NED.    She is also experiencing recurrent vaginal discharge and chronic constipation.  She has been prescribed MetroGel, MiraLax and Bene fiber.    Today, she states she is tried of feeling bad.  She is  experiencing urgency x 8 or more, frequency x 8 or more, is restricting fluids to avoid visits to the restroom, is engaging in toilet mapping, incontinence x 0-3 and nocturia x 8 or more.   Her BP is 136/86.   Her PVR is 20 mL.   Patient denies any modifying or aggravating factors.  Patient denies any gross hematuria, dysuria or suprapubic/flank pain.  Patient denies any fevers, chills, nausea or vomiting.   Her UA yellow slightly cloudy, specific gravity 1.025, 2+ blood, pH 7.0, 0-5 WBCs, 0-2 RBCs, 0-10 epithelial cells and amorphorus crystals present.  She readily admits that she is not following a healthy diet and eats a lot of fast food.  She states she is drinking water.  She states she has not had a BM in several days.  KUB 05/09/2019 nonobstructive bowel gas pattern.  No pneumoperitoneum.    PMH: Past Medical History:  Diagnosis Date  . Fibromyalgia    denies today  . Genital herpes   . Hypertension    gestational  . 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 Urinary tract infection   . Uterine fibroid     Surgical History: Past Surgical History:  Procedure Laterality Date  . CESAREAN SECTION N/A 06/05/2014   Procedure: CESAREAN SECTION;  Surgeon: Truett Mainland, DO;  Location: [facility];  Service: Obstetrics;  Laterality: N/A;  . WISDOM TOOTH EXTRACTION      Home Medications:  Allergies as of 05/09/2019      Reactions   Macrobid [nitrofurantoin] Nausea And Vomiting      Medication List       Accurate as of May 09, 2019 11:59 PM. If you have any questions, ask your nurse or doctor.        azelastine 0.1 % nasal spray Commonly known as: ASTELIN Place 2 sprays into both nostrils 2 (two) times daily.   fluticasone 50 MCG/ACT nasal spray Commonly known as: FLONASE Place 2 sprays into both nostrils daily.   lansoprazole 30 MG capsule Commonly known as: PREVACID 1 pill, once daily to reduce stomach acid   lubiprostone 24 MCG capsule Commonly known as: AMITIZA Take 1 capsule (24 mcg total) by mouth 2 (two) times daily with a meal.   mirabegron ER 25 MG Tb24 tablet Commonly known as: MYRBETRIQ Take 1 tablet (25 mg total) by mouth daily. Started by: Zara Council, PA-C  naproxen 500 MG tablet Commonly known as: NAPROSYN Take 1 tablet (500 mg total) by mouth 2 (two) times daily.   ondansetron 4 MG tablet Commonly known as: Zofran Take 1 tablet (4 mg total) by mouth every 8 (eight) hours as needed for nausea or vomiting.   polyethylene glycol powder 17 GM/SCOOP powder Commonly known as: GLYCOLAX/MIRALAX Mix one scoop 17 gm with water and mix with 8 or more ounces of liquid once per day   predniSONE 50 MG tablet Commonly known as: DELTASONE Take 1 tablet (50 mg total) by mouth daily with breakfast.   solifenacin 5 MG tablet Commonly known as: VESICARE Take 1 tablet (5 mg total) by mouth daily.       Allergies:  Allergies  Allergen  Reactions  . Macrobid [Nitrofurantoin] Nausea And Vomiting    Family History: Family History  Problem Relation Age of Onset  . Hypertension Mother   . Hypertension Father   . Breast cancer Maternal Grandmother        51  . Colon cancer Neg Hx   . Colon polyps Neg Hx   . Kidney disease Neg Hx   . Diabetes Neg Hx   . Esophageal cancer Neg Hx   . Gallbladder disease Neg Hx   . Heart disease Neg Hx   . Asthma Neg Hx   . Cancer Neg Hx   . Stroke Neg Hx     Social History:  reports that she has never smoked. She has never used smokeless tobacco. She reports that she does not drink alcohol or use drugs.  ROS: Pertinent ROS in HPI  Physical Exam: BP 136/86   Pulse (!) 106   Ht 4\' 11"  (1.499 m)   Wt 187 lb 12.8 oz (85.2 kg)   LMP 04/14/2019   BMI 37.93 kg/m   Constitutional:  Well nourished. Alert and oriented, No acute distress. HEENT: [place] AT, moist mucus membranes.  Trachea midline, no masses. Cardiovascular: No clubbing, cyanosis, or edema. Respiratory: Normal respiratory effort, no increased work of breathing. Neurologic: Grossly intact, no focal deficits, moving all 4 extremities. Psychiatric: Normal mood and affect.  Laboratory Data: Lab Results  Component Value Date   WBC 8.1 04/06/2019   HGB 11.6 04/06/2019   HCT 37.0 04/06/2019   MCV 83 04/06/2019   PLT 317 04/06/2019    Lab Results  Component Value Date   CREATININE 0.89 04/06/2019    Lab Results  Component Value Date   HGBA1C 5.8 (H) 04/06/2019    Lab Results  Component Value Date   TSH 0.926 04/06/2019       Component Value Date/Time   CHOL 198 01/07/2017 1419   HDL 56 01/07/2017 1419   CHOLHDL 3.5 01/07/2017 1419   LDLCALC 128 (H) 01/07/2017 1419    Lab Results  Component Value Date   AST 17 12/02/2018   Lab Results  Component Value Date   ALT 16 12/02/2018    Urinalysis Component     Latest Ref Rng & Units 05/09/2019          Specific Gravity, UA     1.005 - 1.030 1.025    pH, UA     5.0 - 7.5 7.0  Color, UA     Yellow Yellow  Appearance Ur     Clear Hazy (A)  Leukocytes,UA     Negative Negative  Protein,UA     Negative/Trace Negative  Glucose, UA     Negative Negative  Ketones, UA  Negative Negative  RBC, UA     Negative 2+ (A)  Bilirubin, UA     Negative Negative  Urobilinogen, Ur     0.2 - 1.0 mg/dL 1.0  Nitrite, UA     Negative Negative  Microscopic Examination      See below:   Component     Latest Ref Rng & Units 05/09/2019  WBC, UA     0 - 5 /hpf 0-5  RBC     0 - 2 /hpf 0-2  Epithelial Cells (non renal)     0 - 10 /hpf 0-10  Crystals     N/A Present (A)  Crystal Type     N/A Amorphous Sediment  Bacteria, UA     None seen/Few None seen    I have reviewed the labs.   Pertinent Imaging: Results for SOYINI, SPICKERMAN (MRN [id number]) as of 05/16/2019 09:04  Ref. Range 05/09/2019 10:32  Scan Result Unknown 20    CLINICAL DATA:  34 year old female with history of flank pain.  EXAM: ABDOMEN - 1 VIEW  COMPARISON:  Abdominal radiograph 12/11/2018.  FINDINGS: Gas and stool are seen scattered throughout the colon extending to the level of the distal rectum. No pathologic distension of small bowel is noted. No gross evidence of pneumoperitoneum.  IMPRESSION: 1. Nonobstructive bowel gas pattern. 2. No pneumoperitoneum.   Electronically Signed   By: Vinnie Langton M.D.   On: 05/09/2019 16:28 I have independently reviewed the films and no constipation is seen.   Assessment & Plan:    1. OAB - Urinalysis, Complete -benign - Bladder Scan (Post Void Residual) in office -PVR minimal Patient will excessive daytime frequency and nocturia.  Did not find Vesicare 5 mg daily effective.  Will have a trial of Myrbetriq 25 mg daily, # 28 samples given.  RTC in 3 weeks for OAB questionnaire and PVR.  2. Pelvic pain Has an upcoming appointment with gynecology May need to consider interstitial cystitis as an etiology if  Myrbetriq is not effective and prescribe amitriptyline or consider hydro distention  3. Chronic constipation Encouraged patient to take the MiraLax daily until she has consistent BM's  Return in about 3 weeks (around 05/30/2019) for PVR and OAB questionnaire.  These notes generated with voice recognition software. I apologize for typographical errors.  Zara Council, PA-C  [facility] [address]  [place], [place] [address] [phone]

## 2019-05-10 ENCOUNTER — Telehealth: Payer: Self-pay | Admitting: Urology

## 2019-05-10 LAB — URINALYSIS, COMPLETE
Bilirubin, UA: NEGATIVE
Glucose, UA: NEGATIVE
Ketones, UA: NEGATIVE
Leukocytes,UA: NEGATIVE
Nitrite, UA: NEGATIVE
Protein,UA: NEGATIVE
Specific Gravity, UA: 1.025 (ref 1.005–1.030)
Urobilinogen, Ur: 1 mg/dL (ref 0.2–1.0)
pH, UA: 7 (ref 5.0–7.5)

## 2019-05-10 LAB — MICROSCOPIC EXAMINATION: Bacteria, UA: NONE SEEN

## 2019-05-10 NOTE — Telephone Encounter (Signed)
Pt calling states that since she started the new Rx Mybetriq that was prescribed yesterday, she has been having low back pain and the burning hasn't eased up any. Please advise. Thanks

## 2019-05-11 ENCOUNTER — Encounter: Payer: Self-pay | Admitting: Family Medicine

## 2019-05-11 MED ORDER — AMITRIPTYLINE HCL 25 MG PO TABS: 25.0000 mg | ORAL_TABLET | Freq: Every day | ORAL | 1 refills | Status: DC

## 2019-05-11 MED FILL — AMITRIPTYLINE HCL 25 MG TAB: 25 | 30 days supply | Qty: 30 | Fill #0

## 2019-05-11 NOTE — Telephone Encounter (Signed)
Patient called again stating she "feels bad" on Myrbetriq. Patient was told that this is not a typical side effect but to stop medication for now. How should she proceed from here?

## 2019-05-11 NOTE — Telephone Encounter (Signed)
Would you call her and get more specific symptoms? At her visit with me, she stated she was tired of feeling bad and that was before I gave her the Myrbetriq samples.

## 2019-05-11 NOTE — Telephone Encounter (Signed)
Unable to leave message, no voicemail set up. RX sent to pharmacy. Mychart message sent to patient.

## 2019-05-11 NOTE — Telephone Encounter (Signed)
We can try of amitriptyline 25 mg qhs, # 30 with one refill.

## 2019-05-11 NOTE — Telephone Encounter (Signed)
On Myrbetriq muscle pain, fatigue, back pain. Those symptoms started after she started Myrbetriq.

## 2019-05-18 ENCOUNTER — Other Ambulatory Visit: Payer: Self-pay | Admitting: Family Medicine

## 2019-05-18 DIAGNOSIS — R110 Nausea: Secondary | ICD-10-CM

## 2019-05-18 MED FILL — LANSOPRAZOLE DR 30 MG CAPSU: 30 | 30 days supply | Qty: 30 | Fill #1

## 2019-05-18 MED FILL — ONDANSETRON HCL 4 MG TABLET: 4 | 6 days supply | Qty: 20 | Fill #0

## 2019-05-18 NOTE — Telephone Encounter (Signed)
Please refill if patient should use

## 2019-05-22 ENCOUNTER — Encounter: Payer: Self-pay | Admitting: Student

## 2019-05-22 ENCOUNTER — Ambulatory Visit (INDEPENDENT_AMBULATORY_CARE_PROVIDER_SITE_OTHER): Payer: Self-pay | Admitting: Student

## 2019-05-22 ENCOUNTER — Other Ambulatory Visit: Payer: Self-pay

## 2019-05-22 VITALS — BP 133/77 | HR 85 | Wt 189.7 lb

## 2019-05-22 DIAGNOSIS — B373 Candidiasis of vulva and vagina: Secondary | ICD-10-CM

## 2019-05-22 DIAGNOSIS — N760 Acute vaginitis: Secondary | ICD-10-CM

## 2019-05-22 DIAGNOSIS — B3731 Acute candidiasis of vulva and vagina: Secondary | ICD-10-CM

## 2019-05-22 DIAGNOSIS — N898 Other specified noninflammatory disorders of vagina: Secondary | ICD-10-CM

## 2019-05-22 DIAGNOSIS — B9689 Other specified bacterial agents as the cause of diseases classified elsewhere: Secondary | ICD-10-CM

## 2019-05-22 DIAGNOSIS — Z113 Encounter for screening for infections with a predominantly sexual mode of transmission: Secondary | ICD-10-CM

## 2019-05-22 MED ORDER — FLUCONAZOLE 150 MG PO TABS: 150.0000 mg | ORAL_TABLET | Freq: Every day | ORAL | 1 refills | Status: DC

## 2019-05-22 MED ORDER — METRONIDAZOLE 0.75 % VA GEL: 1.0000 | Freq: Two times a day (BID) | VAGINAL | 0 refills | Status: DC

## 2019-05-22 MED ORDER — CLINDAMYCIN HCL 300 MG PO CAPS: 300.0000 mg | ORAL_CAPSULE | Freq: Two times a day (BID) | ORAL | 0 refills | Status: DC

## 2019-05-22 MED FILL — metroNIDAZOLE 0.75 % GEL: 0.75 | 2 days supply | Qty: 70 | Fill #0

## 2019-05-22 NOTE — Progress Notes (Signed)
History:  Sherri [name] is a 34 y.o. [phone] who presents to clinic today for vaginal discharge. Reports foul smelling discharge & internal itching & burning. Has been diagnosed with BV >3 times in the last year. Was treated with metrogel in January, which she completed, but didn't use boric acid as discussed. She is sexually active with 1 partner x 2 years & does not use condoms. Denies new soaps or detergents & does not douche.     Patient Active Problem List   Diagnosis Date Noted  . Foul smelling vaginal discharge 03/09/2019  . Chronic constipation 03/09/2019  . Pelvic pain in female 03/09/2019  . Exposure to HIV 02/02/2019  . Hypertension   . Genital herpes   . Yeast infection 12/03/2016  . Status post cesarean delivery 06/06/2014  . Uterine fibroid 12/25/2013  . Obesity 12/25/2013  . Screen for STD (sexually transmitted disease) 04/27/2012    Allergies  Allergen Reactions  . Macrobid [Nitrofurantoin] Nausea And Vomiting    Current Outpatient Medications on File Prior to Visit  Medication Sig Dispense Refill  . amitriptyline (ELAVIL) 25 MG tablet Take 1 tablet (25 mg total) by mouth at bedtime. 30 tablet 1  . azelastine (ASTELIN) 0.1 % nasal spray Place 2 sprays into both nostrils 2 (two) times daily. 30 mL 0  . fluticasone (FLONASE) 50 MCG/ACT nasal spray Place 2 sprays into both nostrils daily. 1 g 0  . lansoprazole (PREVACID) 30 MG capsule 1 pill, once daily to reduce stomach acid 30 capsule 3  . lubiprostone (AMITIZA) 24 MCG capsule Take 1 capsule (24 mcg total) by mouth 2 (two) times daily with a meal. 60 capsule 3  . mirabegron ER (MYRBETRIQ) 25 MG TB24 tablet Take 1 tablet (25 mg total) by mouth daily. 28 tablet 0  . naproxen (NAPROSYN) 500 MG tablet Take 1 tablet (500 mg total) by mouth 2 (two) times daily. 30 tablet 0  . ondansetron (ZOFRAN) 4 MG tablet TAKE 1 TABLET (4 MG TOTAL) BY MOUTH EVERY 8 (EIGHT) HOURS AS NEEDED FOR NAUSEA OR VOMITING. 20 tablet 0  .  polyethylene glycol powder (GLYCOLAX/MIRALAX) 17 GM/SCOOP powder Mix one scoop 17 gm with water and mix with 8 or more ounces of liquid once per day 578 g 11  . predniSONE (DELTASONE) 50 MG tablet Take 1 tablet (50 mg total) by mouth daily with breakfast. 5 tablet 0  . solifenacin (VESICARE) 5 MG tablet Take 1 tablet (5 mg total) by mouth daily. 30 tablet 1  . [DISCONTINUED] esomeprazole (NEXIUM) 40 MG capsule Take 1 capsule (40 mg total) by mouth daily. To reduce stomach acid 30 capsule 5   No current facility-administered medications on file prior to visit.     The following portions of the patient's history were reviewed and updated as appropriate: allergies, current medications, family history, past medical history, social history, past surgical history and problem list.  Review of Systems:  Other than those mentioned in HPI all ROS negative   Objective:  Physical Exam BP 133/77   Pulse 85   Wt 189 lb 11.2 oz (86 kg)   LMP 05/15/2019 (Exact Date)   BMI 38.31 kg/m  CONSTITUTIONAL: Well-developed, well-nourished female in no acute distress.  EYES: EOM intact, conjunctivae normal, no scleral icterus HEAD: Normocephalic, atraumatic.   GENITOURINARY: NEFG. Small amount of curdy white discharge. Cervix pink/smooth/not friable.  MUSCULOSKELETAL: Normal range of motion. No tenderness. SKIN: Skin is warm and dry. No rash noted. Not diaphoretic. No erythema.  No pallor.Marland Kitchen PSYCHIATRIC: Normal mood and affect. Normal behavior. Normal judgment and thought content.    Labs and Imaging Vaginal swab to test for yeast, BV, trich, GC/CT  Assessment & Plan:  1. Foul smelling vaginal discharge -Patient is self pay & goes to [facility] for PCP & prescriptions. Had thorough discussion regarding treatment of recurrent BV including clindamycin, flagyl & boric acid, or suppressive metrogel therapy. Also discussed partner using condoms & decrease in her high fat diet. Patient's  preference at this time is for metrogel bid x 1 week.  - Cervicovaginal ancillary only( [facility])  2. Bacterial vaginosis  - metroNIDAZOLE (METROGEL VAGINAL) 0.75 % vaginal gel; Place 1 Applicatorful vaginally 2 (two) times daily.  Dispense: 70 g; Refill: 0  3. Vaginal yeast infection -exam & irritation consistent with vaginal yeast infection - fluconazole (DIFLUCAN) 150 MG tablet; Take 1 tablet (150 mg total) by mouth daily.  Dispense: 1 tablet; Refill: 1   Jorje Guild, NP 05/22/2019 6:55 PM

## 2019-05-22 NOTE — Patient Instructions (Signed)
Bacterial Vaginosis  Bacterial vaginosis is a vaginal infection that occurs when the normal balance of bacteria in the vagina is disrupted. It results from an overgrowth of certain bacteria. This is the most common vaginal infection among women ages 15-44. Because bacterial vaginosis increases your risk for STIs (sexually transmitted infections), getting treated can help reduce your risk for chlamydia, gonorrhea, herpes, and HIV (human immunodeficiency virus). Treatment is also important for preventing complications in pregnant women, because this condition can cause an early (premature) delivery. What are the causes? This condition is caused by an increase in harmful bacteria that are normally present in small amounts in the vagina. However, the reason that the condition develops is not fully understood. What increases the risk? The following factors may make you more likely to develop this condition:  Having a new sexual partner or multiple sexual partners.  Having unprotected sex.  Douching.  Having an intrauterine device (IUD).  Smoking.  Drug and alcohol abuse.  Taking certain antibiotic medicines.  Being pregnant. You cannot get bacterial vaginosis from toilet seats, bedding, swimming pools, or contact with objects around you. What are the signs or symptoms? Symptoms of this condition include:  Grey or white vaginal discharge. The discharge can also be watery or foamy.  A fish-like odor with discharge, especially after sexual intercourse or during menstruation.  Itching in and around the vagina.  Burning or pain with urination. Some women with bacterial vaginosis have no signs or symptoms. How is this diagnosed? This condition is diagnosed based on:  Your medical history.  A physical exam of the vagina.  Testing a sample of vaginal fluid under a microscope to look for a large amount of bad bacteria or abnormal cells. Your health care provider may use a cotton swab or  a small wooden spatula to collect the sample. How is this treated? This condition is treated with antibiotics. These may be given as a pill, a vaginal cream, or a medicine that is put into the vagina (suppository). If the condition comes back after treatment, a second round of antibiotics may be needed. Follow these instructions at home: Medicines  Take over-the-counter and prescription medicines only as told by your health care provider.  Take or use your antibiotic as told by your health care provider. Do not stop taking or using the antibiotic even if you start to feel better. General instructions  If you have a female sexual partner, tell her that you have a vaginal infection. She should see her health care provider and be treated if she has symptoms. If you have a female sexual partner, he does not need treatment.  During treatment: ? Avoid sexual activity until you finish treatment. ? Do not douche. ? Avoid alcohol as directed by your health care provider. ? Avoid breastfeeding as directed by your health care provider.  Drink enough water and fluids to keep your urine clear or pale yellow.  Keep the area around your vagina and rectum clean. ? Wash the area daily with warm water. ? Wipe yourself from front to back after using the toilet.  Keep all follow-up visits as told by your health care provider. This is important. How is this prevented?  Do not douche.  Wash the outside of your vagina with warm water only.  Use protection when having sex. This includes latex condoms and dental dams.  Limit how many sexual partners you have. To help prevent bacterial vaginosis, it is best to have sex with just one partner (  monogamous).  Make sure you and your sexual partner are tested for STIs.  Wear cotton or cotton-lined underwear.  Avoid wearing tight pants and pantyhose, especially during summer.  Limit the amount of alcohol that you drink.  Do not use any products that contain  nicotine or tobacco, such as cigarettes and e-cigarettes. If you need help quitting, ask your health care provider.  Do not use illegal drugs. Where to find more information  Centers for Disease Control and Prevention: www.cdc.gov/std  American Sexual Health Association (ASHA): www.ashastd.org  U.S. Department of Health and Human Services, Office on Women's Health: www.womenshealth.gov/ or https://www.womenshealth.gov/a-z-topics/bacterial-vaginosis Contact a health care provider if:  Your symptoms do not improve, even after treatment.  You have more discharge or pain when urinating.  You have a fever.  You have pain in your abdomen.  You have pain during sex.  You have vaginal bleeding between periods. Summary  Bacterial vaginosis is a vaginal infection that occurs when the normal balance of bacteria in the vagina is disrupted.  Because bacterial vaginosis increases your risk for STIs (sexually transmitted infections), getting treated can help reduce your risk for chlamydia, gonorrhea, herpes, and HIV (human immunodeficiency virus). Treatment is also important for preventing complications in pregnant women, because the condition can cause an early (premature) delivery.  This condition is treated with antibiotic medicines. These may be given as a pill, a vaginal cream, or a medicine that is put into the vagina (suppository). This information is not intended to replace advice given to you by your health care provider. Make sure you discuss any questions you have with your health care provider. Document Revised: 01/29/2017 Document Reviewed: 11/02/2015 Elsevier Patient Education  2020 Elsevier Inc.  

## 2019-05-23 LAB — CERVICOVAGINAL ANCILLARY ONLY
Bacterial Vaginitis (gardnerella): POSITIVE — AB
Candida Glabrata: NEGATIVE
Candida Vaginitis: NEGATIVE
Chlamydia: NEGATIVE
Comment: NEGATIVE
Comment: NEGATIVE
Comment: NEGATIVE
Comment: NEGATIVE
Comment: NEGATIVE
Comment: NORMAL
Neisseria Gonorrhea: NEGATIVE
Trichomonas: NEGATIVE

## 2019-05-23 MED FILL — FLUCONAZOLE 150 MG TABLET: 150 | 1 days supply | Qty: 1 | Fill #0

## 2019-05-30 ENCOUNTER — Ambulatory Visit: Payer: Self-pay | Admitting: Urology

## 2019-06-01 ENCOUNTER — Other Ambulatory Visit: Payer: Self-pay | Admitting: Internal Medicine

## 2019-06-01 DIAGNOSIS — R110 Nausea: Secondary | ICD-10-CM

## 2019-06-01 MED FILL — ONDANSETRON HCL 4 MG TABLET: 4 | 6 days supply | Qty: 20 | Fill #0

## 2019-06-05 MED FILL — AMITRIPTYLINE HCL 25 MG TAB: 25 | 30 days supply | Qty: 30 | Fill #1

## 2019-06-05 MED FILL — FLUCONAZOLE 150 MG TABLET: 150 | 1 days supply | Qty: 1 | Fill #1

## 2019-06-15 ENCOUNTER — Ambulatory Visit: Payer: Self-pay | Admitting: Urology

## 2019-06-19 ENCOUNTER — Other Ambulatory Visit: Payer: Self-pay | Admitting: Family Medicine

## 2019-06-19 DIAGNOSIS — R110 Nausea: Secondary | ICD-10-CM

## 2019-06-21 ENCOUNTER — Other Ambulatory Visit: Payer: Self-pay

## 2019-06-21 ENCOUNTER — Ambulatory Visit (HOSPITAL_COMMUNITY)
Admission: EM | Admit: 2019-06-21 | Discharge: 2019-06-21 | Disposition: A | Discharge status: Home / Self Care | Payer: Self-pay | Attending: Internal Medicine | Admitting: Internal Medicine

## 2019-06-21 DIAGNOSIS — K922 Gastrointestinal hemorrhage, unspecified: Secondary | ICD-10-CM | POA: Insufficient documentation

## 2019-06-21 LAB — BASIC METABOLIC PANEL
Anion gap: 8 (ref 5–15)
BUN: 10 mg/dL (ref 6–20)
CO2: 24 mmol/L (ref 22–32)
Calcium: 9.5 mg/dL (ref 8.9–10.3)
Chloride: 104 mmol/L (ref 98–111)
Creatinine, Ser: 0.86 mg/dL (ref 0.44–1.00)
GFR calc Af Amer: >60 mL/min (ref >60)
GFR calc non Af Amer: >60 mL/min (ref >60)
Glucose, Bld: 99 mg/dL (ref 70–99)
Potassium: 4.3 mmol/L (ref 3.5–5.1)
Sodium: 136 mmol/L (ref 135–145)

## 2019-06-21 LAB — CBC WITH DIFFERENTIAL/PLATELET
Abs Immature Granulocytes: 0.03 10*3/uL (ref 0.00–0.07)
Basophils Absolute: 0.1 10*3/uL (ref 0.0–0.1)
Basophils Relative: 1 %
Eosinophils Absolute: 0.3 10*3/uL (ref 0.0–0.5)
Eosinophils Relative: 4 %
HCT: 39.9 % (ref 36.0–46.0)
Hemoglobin: 12.4 g/dL (ref 12.0–15.0)
Immature Granulocytes: 0 %
Lymphocytes Relative: 35 %
Lymphs Abs: 2.6 10*3/uL (ref 0.7–4.0)
MCH: 25.8 pg — ABNORMAL LOW (ref 26.0–34.0)
MCHC: 31.1 g/dL (ref 30.0–36.0)
MCV: 83 fL (ref 80.0–100.0)
Monocytes Absolute: 0.4 10*3/uL (ref 0.1–1.0)
Monocytes Relative: 6 %
Neutro Abs: 4 10*3/uL (ref 1.7–7.7)
Neutrophils Relative %: 54 %
Platelets: 314 10*3/uL (ref 150–400)
RBC: 4.81 MIL/uL (ref 3.87–5.11)
RDW: 16.7 % — ABNORMAL HIGH (ref 11.5–15.5)
WBC: 7.3 10*3/uL (ref 4.0–10.5)
nRBC: 0 % (ref 0.0–0.2)

## 2019-06-21 MED ORDER — LIDOCAINE VISCOUS HCL 2 % MT SOLN
OROMUCOSAL | Status: AC
  Filled 2019-06-21: qty 15

## 2019-06-21 MED ORDER — PANTOPRAZOLE SODIUM 20 MG PO TBEC: 20.0000 mg | DELAYED_RELEASE_TABLET | Freq: Two times a day (BID) | ORAL | 1 refills | Status: DC

## 2019-06-21 MED ORDER — ONDANSETRON 4 MG PO TBDP: 4.0000 mg | ORAL_TABLET | Freq: Three times a day (TID) | ORAL | 0 refills | Status: DC | PRN

## 2019-06-21 MED ORDER — ALUM & MAG HYDROXIDE-SIMETH 200-200-20 MG/5ML PO SUSP
ORAL | Status: AC
  Filled 2019-06-21: qty 30

## 2019-06-21 MED ORDER — ALUM & MAG HYDROXIDE-SIMETH 200-200-20 MG/5ML PO SUSP
30.0000 mL | Freq: Once | ORAL | Status: AC
  Administered 2019-06-21: 30 mL via ORAL

## 2019-06-21 MED ORDER — LIDOCAINE VISCOUS HCL 2 % MT SOLN
15.0000 mL | Freq: Once | OROMUCOSAL | Status: AC
  Administered 2019-06-21: 15 mL via ORAL

## 2019-06-21 MED FILL — ONDANSETRON ODT 4 MG TABLET: 4 | 6 days supply | Qty: 20 | Fill #0

## 2019-06-21 MED FILL — PANTOPRAZOLE SOD DR 20 MG T: 20 | 30 days supply | Qty: 60 | Fill #0

## 2019-06-21 NOTE — ED Triage Notes (Signed)
Pt c/o RLQ and LLQ abdominal pain x 3 weeks, nausea, diarrhea and black stools. Denies any history of same. Denies bright red stools.

## 2019-06-23 NOTE — ED Provider Notes (Signed)
[facility]    CSN: [id number] Arrival date & time: 06/21/19  [id number]      History   Chief Complaint Chief Complaint  Patient presents with  . Melena    HPI Sherri [name] is a 34 y.o. female comes to urgent care with 3-week history of lower abdominal pain, nausea and black stools.  Patient says that she has been passing on the average 1 black stool daily for the past few weeks.  Abdominal tenderness is in both lower quadrants.  It is associated with some nausea but no vomiting.  And she denies any bright red stool.  No weight loss.  No dizziness, near syncope or syncopal episodes.  Patient denies any NSAID use.  Patient does not take iron.  No abdominal pain with food intake or when hungry.  HPI  Past Medical History:  Diagnosis Date  . Fibromyalgia    denies today  . Genital herpes   . Hypertension    gestational  . 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 Urinary tract infection   . Uterine fibroid     Patient Active Problem List   Diagnosis Date Noted  . Foul smelling vaginal discharge 03/09/2019  . Chronic constipation 03/09/2019  . Pelvic pain in female 03/09/2019  . Exposure to HIV 02/02/2019  . Hypertension   . Genital herpes   . Yeast infection 12/03/2016  . Status post cesarean delivery 06/06/2014  . Uterine fibroid 12/25/2013  . Obesity 12/25/2013  . Screen for STD (sexually transmitted disease) 04/27/2012    Past Surgical History:  Procedure Laterality Date  . CESAREAN SECTION N/A 06/05/2014   Procedure: CESAREAN SECTION;  Surgeon: Truett Mainland, DO;  Location: [facility];  Service: Obstetrics;  Laterality: N/A;  . WISDOM TOOTH EXTRACTION      OB History    Gravida  1   Para  1   Term      Preterm  1   AB      Living  2     SAB      TAB      Ectopic      Multiple  1   Live Births  2            Home Medications    Prior to Admission medications   Medication Sig Start Date End Date Taking?  Authorizing Provider  amitriptyline (ELAVIL) 25 MG tablet Take 1 tablet (25 mg total) by mouth at bedtime. 05/11/19   Zara Council A, PA-C  azelastine (ASTELIN) 0.1 % nasal spray Place 2 sprays into both nostrils 2 (two) times daily. 05/02/19   Tasia Catchings, Amy V, PA-C  fluconazole (DIFLUCAN) 150 MG tablet Take 1 tablet (150 mg total) by mouth daily. 05/22/19   Jorje Guild, NP  fluticasone [facility]) 50 MCG/ACT nasal spray Place 2 sprays into both nostrils daily. 05/02/19   Ok Edwards, PA-C  lansoprazole (PREVACID) 30 MG capsule 1 pill, once daily to reduce stomach acid 04/20/19   Fulp, Cammie, MD  lubiprostone (AMITIZA) 24 MCG capsule Take 1 capsule (24 mcg total) by mouth 2 (two) times daily with a meal. 04/27/19   Willia Craze, NP  metroNIDAZOLE (METROGEL VAGINAL) 0.75 % vaginal gel Place 1 Applicatorful vaginally 2 (two) times daily. 05/22/19   Jorje Guild, NP  mirabegron ER (MYRBETRIQ) 25 MG TB24 tablet Take 1 tablet (25 mg total) by mouth daily. 05/09/19   McGowan, [name] A, PA-C  naproxen (NAPROSYN) 500 MG  tablet Take 1 tablet (500 mg total) by mouth 2 (two) times daily. 03/25/19   Alveria Apley, PA-C  ondansetron (ZOFRAN ODT) 4 MG disintegrating tablet Take 1 tablet (4 mg total) by mouth every 8 (eight) hours as needed for nausea or vomiting. 06/21/19   Prescott Truex, Myrene Galas, MD  ondansetron (ZOFRAN) 4 MG tablet TAKE 1 TABLET (4 MG TOTAL) BY MOUTH EVERY 8 (EIGHT) HOURS AS NEEDED FOR NAUSEA OR VOMITING. 06/01/19   Fulp, Cammie, MD  pantoprazole (PROTONIX) 20 MG tablet Take 1 tablet (20 mg total) by mouth 2 (two) times daily. 06/21/19 07/21/19  Chase Picket, MD  polyethylene glycol powder (GLYCOLAX/MIRALAX) 17 GM/SCOOP powder Mix one scoop 17 gm with water and mix with 8 or more ounces of liquid once per day 04/06/19   Fulp, Cammie, MD  predniSONE (DELTASONE) 50 MG tablet Take 1 tablet (50 mg total) by mouth daily with breakfast. Patient taking differently: Take 50 mg by mouth daily with breakfast. Pt not  taking 05/02/19   Tasia Catchings, Amy V, PA-C  solifenacin (VESICARE) 5 MG tablet Take 1 tablet (5 mg total) by mouth daily. 04/07/19   Stoioff, Ronda Fairly, MD  esomeprazole (NEXIUM) 40 MG capsule Take 1 capsule (40 mg total) by mouth daily. To reduce stomach acid 03/07/18 04/20/19  Antony Blackbird, MD    Family History Family History  Problem Relation Age of Onset  . Hypertension Mother   . Hypertension Father   . Breast cancer Maternal Grandmother        36  . Colon cancer Neg Hx   . Colon polyps Neg Hx   . Kidney disease Neg Hx   . Diabetes Neg Hx   . Esophageal cancer Neg Hx   . Gallbladder disease Neg Hx   . Heart disease Neg Hx   . Asthma Neg Hx   . Cancer Neg Hx   . Stroke Neg Hx     Social History Social History   Tobacco Use  . Smoking status: Never Smoker  . Smokeless tobacco: Never Used  Substance Use Topics  . Alcohol use: No  . Drug use: No     Allergies   Macrobid [nitrofurantoin]   Review of Systems Review of Systems  Constitutional: Negative for activity change, appetite change and unexpected weight change.  HENT: Negative.   Gastrointestinal: Positive for blood in stool. Negative for abdominal pain, anal bleeding, diarrhea and vomiting.  Genitourinary: Negative.   Musculoskeletal: Negative.   Skin: Negative.   Neurological: Negative for dizziness, facial asymmetry, light-headedness and headaches.  Psychiatric/Behavioral: Negative.      Physical Exam Triage Vital Signs ED Triage Vitals [06/21/19 0954]  Enc Vitals Group     BP (!) 147/86     Pulse Rate 99     Resp 16     Temp 98.9 F (37.2 C)     Temp src      SpO2 97 %     Weight      Height      Head Circumference      Peak Flow      Pain Score 9     Pain Loc      Pain Edu?      Excl. in [place]?    No data found.  Updated Vital Signs BP (!) 147/86   Pulse 99   Temp 98.9 F (37.2 C)   Resp 16   LMP 05/02/2019   SpO2 97%   Visual Acuity Right Eye Distance:  Left Eye Distance:   Bilateral  Distance:    Right Eye Near:   Left Eye Near:    Bilateral Near:     Physical Exam Constitutional:      General: She is not in acute distress.    Appearance: She is not ill-appearing.  Cardiovascular:     Rate and Rhythm: Normal rate and regular rhythm.     Pulses: Normal pulses.     Heart sounds: Normal heart sounds.  Pulmonary:     Effort: Pulmonary effort is normal. No respiratory distress.     Breath sounds: Normal breath sounds. No rhonchi.  Abdominal:     General: Bowel sounds are normal. There is no distension.     Palpations: Abdomen is soft.     Tenderness: There is no guarding or rebound.  Musculoskeletal:        General: Normal range of motion.  Skin:    General: Skin is warm.     Capillary Refill: Capillary refill takes less than 2 seconds.  Neurological:     General: No focal deficit present.     Mental Status: She is alert and oriented to person, place, and time.      UC Treatments / Results  Labs (all labs ordered are listed, but only abnormal results are displayed) Labs Reviewed  CBC WITH DIFFERENTIAL/PLATELET - Abnormal; Notable for the following components:      Result Value   MCH 25.8 (*)    RDW 16.7 (*)    All other components within normal limits  BASIC METABOLIC PANEL    EKG   Radiology No results found.  Procedures Procedures (including critical care time)  Medications Ordered in UC Medications  alum & mag hydroxide-simeth (MAALOX/MYLANTA) 200-200-20 MG/5ML suspension 30 mL (30 mLs Oral Given 06/21/19 1034)    And  lidocaine (XYLOCAINE) 2 % viscous mouth solution 15 mL (15 mLs Oral Given 06/21/19 1034)    Initial Impression / Assessment and Plan / UC Course  I have reviewed the triage vital signs and the nursing notes.  Pertinent labs & imaging results that were available during my care of the patient were reviewed by me and considered in my medical decision making (see chart for details).     1.  Acute upper GI bleed without  hemodynamic instability: CBC Protonix 20 mg twice daily Zofran ODT as needed for nausea/vomiting If patient symptoms worsens, she needs to go to the emergency department to be evaluated Other return precautions regarding persistent dizziness, vomiting, feeling faint or abdominal distention will require patient to return to urgent care or emergency department to be reevaluated. Patient advised to follow-up with primary care.  She may need gastroenterology evaluation in the near future. Final Clinical Impressions(s) / UC Diagnoses   Final diagnoses:  Acute upper GI bleed   Discharge Instructions   None    ED Prescriptions    Medication Sig Dispense Auth. Provider   pantoprazole (PROTONIX) 20 MG tablet Take 1 tablet (20 mg total) by mouth 2 (two) times daily. 60 tablet Laretha Luepke, Myrene Galas, MD   ondansetron (ZOFRAN ODT) 4 MG disintegrating tablet Take 1 tablet (4 mg total) by mouth every 8 (eight) hours as needed for nausea or vomiting. 20 tablet Mabel Roll, Myrene Galas, MD     PDMP not reviewed this encounter.   Chase Picket, MD 06/23/19 1227

## 2019-06-29 ENCOUNTER — Ambulatory Visit: Payer: Self-pay | Admitting: Urology

## 2019-06-30 ENCOUNTER — Other Ambulatory Visit: Payer: Self-pay | Admitting: Family Medicine

## 2019-06-30 DIAGNOSIS — R110 Nausea: Secondary | ICD-10-CM

## 2019-06-30 MED FILL — ONDANSETRON HCL 4 MG TABLET: 4 | 6 days supply | Qty: 20 | Fill #0

## 2019-07-03 ENCOUNTER — Other Ambulatory Visit: Payer: Self-pay | Admitting: Urology

## 2019-07-04 ENCOUNTER — Encounter: Payer: Self-pay | Admitting: Urology

## 2019-07-04 ENCOUNTER — Ambulatory Visit (INDEPENDENT_AMBULATORY_CARE_PROVIDER_SITE_OTHER): Payer: Self-pay | Admitting: Urology

## 2019-07-04 ENCOUNTER — Other Ambulatory Visit: Payer: Self-pay

## 2019-07-04 VITALS — BP 122/77 | HR 109 | Ht 59.0 in | Wt 199.0 lb

## 2019-07-04 DIAGNOSIS — R102 Pelvic and perineal pain unspecified side: Secondary | ICD-10-CM

## 2019-07-04 DIAGNOSIS — N3281 Overactive bladder: Secondary | ICD-10-CM

## 2019-07-04 DIAGNOSIS — K921 Melena: Secondary | ICD-10-CM

## 2019-07-04 LAB — BLADDER SCAN AMB NON-IMAGING

## 2019-07-04 NOTE — Progress Notes (Signed)
07/04/2019 1:53 PM   Nira Conn A Hauter [birth year] [id number]  Referring provider: Antony Blackbird, MD [address] [place],  [place] [address]  Chief Complaint  Patient presents with  . Follow-up    3 week follow-up    HPI: Sherri [name] is a 34 year old female with chronic constipation, OAB and pelvic pain who presents today for follow up.    Background history: She was seen by Dr. Bernardo Heater for the complaint of dysuria and hematuria.  She has associated frequency, urgency, dyspareunia, low back pain and pelvic pain.  The hematuria was only noted on positive dip.  She does not endorse gross hematuria and there has been no microscopic evidence of hematuria.  She has had a trial of Bactrim and Diflucan, Naprosyn and Zofran, Uribel and Vesicare 5 mg daily for her symptoms without relief.  Cysto 04/07/2019 NED.    She is also experiencing recurrent vaginal discharge and chronic constipation.  She has been prescribed MetroGel, MiraLax and Bene fiber.  She has seen gynecology and treated for BV.  These have not brought relief to her symptoms.   At her visit on 05/09/2019, she was tired of feeling bad.  She is  experiencing urgency x 8 or more, frequency x 8 or more, is restricting fluids to avoid visits to the restroom, is engaging in toilet mapping, incontinence x 0-3 and nocturia x 8 or more.   Her BP is 136/86.   Her PVR is 20 mL.   Her UA yellow slightly cloudy, specific gravity 1.025, 2+ blood, pH 7.0, 0-5 WBCs, 0-2 RBCs, 0-10 epithelial cells and amorphorus crystals present.  She readily admitted that she is not following a healthy diet and eats a lot of fast food.  She states she is drinking water.  She states she has not had a BM in several days. KUB 05/09/2019 nonobstructive bowel gas pattern.  No pneumoperitoneum.  She was given samples of Myrbetriq, but she called the office back stating that she started to experience muscle pain, fatigue and back pain.  She was then called in  amitriptyline.25 mg daily.   Today, patient is  experiencing urgency x 8 or more (unchanged), frequency x 8 or more (unchanged), is restricting fluids to avoid visits to the restroom, is engaging in toilet mapping, incontinence x 0-3 (unchanged) and nocturia x 8 or more (unchanged).   Her BP is 122/77.   Her PVR is 1 mL.   Her main complaints are frequency, urgency and dysuria company with lower abdominal pain.   Her UA is negative.  Her PVR is 1 mL.      PMH: Past Medical History:  Diagnosis Date  . Fibromyalgia    denies today  . Genital herpes   . Hypertension    gestational  . 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 Urinary tract infection   . Uterine fibroid     Surgical History: Past Surgical History:  Procedure Laterality Date  . CESAREAN SECTION N/A 06/05/2014   Procedure: CESAREAN SECTION;  Surgeon: Truett Mainland, DO;  Location: [facility];  Service: Obstetrics;  Laterality: N/A;  . WISDOM TOOTH EXTRACTION     Allergy: Allergies  Allergen Reactions  . Macrobid [Nitrofurantoin] Nausea And Vomiting    Home Medications:  Current Outpatient Medications on File Prior to Visit  Medication Sig Dispense Refill  . azelastine (ASTELIN) 0.1 % nasal spray Place 2 sprays into both nostrils 2 (two) times daily. 30 mL 0  .  fluticasone (FLONASE) 50 MCG/ACT nasal spray Place 2 sprays into both nostrils daily. 1 g 0  . lansoprazole (PREVACID) 30 MG capsule 1 pill, once daily to reduce stomach acid 30 capsule 3  . lubiprostone (AMITIZA) 24 MCG capsule Take 1 capsule (24 mcg total) by mouth 2 (two) times daily with a meal. 60 capsule 3  . mirabegron ER (MYRBETRIQ) 25 MG TB24 tablet Take 1 tablet (25 mg total) by mouth daily. 28 tablet 0  . naproxen (NAPROSYN) 500 MG tablet Take 1 tablet (500 mg total) by mouth 2 (two) times daily. 30 tablet 0  . ondansetron (ZOFRAN ODT) 4 MG disintegrating tablet Take 1 tablet (4 mg total) by mouth every 8 (eight) hours as needed for  nausea or vomiting. 20 tablet 0  . ondansetron (ZOFRAN) 4 MG tablet TAKE 1 TABLET (4 MG TOTAL) BY MOUTH EVERY 8 (EIGHT) HOURS AS NEEDED FOR NAUSEA OR VOMITING. 20 tablet 0  . pantoprazole (PROTONIX) 20 MG tablet Take 1 tablet (20 mg total) by mouth 2 (two) times daily. 60 tablet 1  . polyethylene glycol powder (GLYCOLAX/MIRALAX) 17 GM/SCOOP powder Mix one scoop 17 gm with water and mix with 8 or more ounces of liquid once per day 578 g 11  . amitriptyline (ELAVIL) 25 MG tablet TAKE 1 TABLET (25 MG TOTAL) BY MOUTH AT BEDTIME. 30 tablet 1  . [DISCONTINUED] esomeprazole (NEXIUM) 40 MG capsule Take 1 capsule (40 mg total) by mouth daily. To reduce stomach acid 30 capsule 5   No current facility-administered medications on file prior to visit.    Allergies:  Allergies  Allergen Reactions  . Macrobid [Nitrofurantoin] Nausea And Vomiting    Family History: Family History  Problem Relation Age of Onset  . Hypertension Mother   . Hypertension Father   . Breast cancer Maternal Grandmother        3  . Colon cancer Neg Hx   . Colon polyps Neg Hx   . Kidney disease Neg Hx   . Diabetes Neg Hx   . Esophageal cancer Neg Hx   . Gallbladder disease Neg Hx   . Heart disease Neg Hx   . Asthma Neg Hx   . Cancer Neg Hx   . Stroke Neg Hx     Social History:  reports that she has never smoked. She has never used smokeless tobacco. She reports that she does not drink alcohol or use drugs.  ROS: Pertinent ROS in HPI  Physical Exam: BP 122/77   Pulse (!) 109   Ht 4\' 11"  (1.499 m)   Wt 199 lb (90.3 kg)   BMI 40.19 kg/m   Constitutional:  Well nourished. Alert and oriented, No acute distress. HEENT: [place] AT, mask in place.  Trachea midline Cardiovascular: No clubbing, cyanosis, or edema. Respiratory: Normal respiratory effort, no increased work of breathing. Neurologic: Grossly intact, no focal deficits, moving all 4 extremities. Psychiatric: Normal mood and affect.   Laboratory Data: Lab  Results  Component Value Date   WBC 7.3 06/21/2019   HGB 12.4 06/21/2019   HCT 39.9 06/21/2019   MCV 83.0 06/21/2019   PLT 314 06/21/2019    Lab Results  Component Value Date   CREATININE 0.86 06/21/2019    Lab Results  Component Value Date   HGBA1C 5.8 (H) 04/06/2019    Lab Results  Component Value Date   TSH 0.926 04/06/2019       Component Value Date/Time   CHOL 198 01/07/2017 1419   HDL  56 01/07/2017 1419   CHOLHDL 3.5 01/07/2017 1419   LDLCALC 128 (H) 01/07/2017 1419    Lab Results  Component Value Date   AST 17 12/02/2018   Lab Results  Component Value Date   ALT 16 12/02/2018    Urinalysis Component     Latest Ref Rng & Units 05/09/2019          Specific Gravity, UA     1.005 - 1.030 1.025  pH, UA     5.0 - 7.5 7.0  Color, UA     Yellow Yellow  Appearance Ur     Clear Hazy (A)  Leukocytes,UA     Negative Negative  Protein,UA     Negative/Trace Negative  Glucose, UA     Negative Negative  Ketones, UA     Negative Negative  RBC, UA     Negative 2+ (A)  Bilirubin, UA     Negative Negative  Urobilinogen, Ur     0.2 - 1.0 mg/dL 1.0  Nitrite, UA     Negative Negative  Microscopic Examination      See below:   Component     Latest Ref Rng & Units 05/09/2019  WBC, UA     0 - 5 /hpf 0-5  RBC     0 - 2 /hpf 0-2  Epithelial Cells (non renal)     0 - 10 /hpf 0-10  Crystals     N/A Present (A)  Crystal Type     N/A Amorphous Sediment  Bacteria, UA     None seen/Few None seen    I have reviewed the labs.   Pertinent Imaging: Results for AVACYN, BJORGE (MRN [id number]) as of 07/11/2019 10:56  Ref. Range 07/04/2019 13:13  Scan Result Unknown 82ml   Assessment & Plan:    1. OAB - Urinalysis, Complete -benign - Bladder Scan (Post Void Residual) in office -PVR minimal - Cysto negative - Patient will excessive daytime frequency and nocturia.  Did not find Vesicare 5 mg daily effective, had intolerable side effects with Myrbetriq and  did not find relief with amitriptyline.   2. Pelvic pain Has an upcoming appointment with gynecology May need to consider interstitial cystitis as an etiology as OAB medication and amitriptyline will consider proceeding with cystoscopy with hydro distention  3. Chronic constipation/Melena  Was instructed to follow up with PCP by ED I would like her to follow up with her PCP, Dr. Chapman Fitch, regarding this issue to see if a GI referral or if further work up is need for evaluation, if a CT is appropriate would like to have the results of this prior to scheduling hydro distention   These notes generated with voice recognition software. I apologize for typographical errors.  Zara Council, PA-C  [facility] [address]  [place], [place] [address] [phone]

## 2019-07-05 LAB — URINALYSIS, COMPLETE
Bilirubin, UA: NEGATIVE
Glucose, UA: NEGATIVE
Ketones, UA: NEGATIVE
Leukocytes,UA: NEGATIVE
Nitrite, UA: NEGATIVE
Protein,UA: NEGATIVE
Specific Gravity, UA: >1.03 — ABNORMAL HIGH (ref 1.005–1.030)
Urobilinogen, Ur: 0.2 mg/dL (ref 0.2–1.0)
pH, UA: 5.5 (ref 5.0–7.5)

## 2019-07-05 LAB — MICROSCOPIC EXAMINATION: Bacteria, UA: NONE SEEN

## 2019-07-05 MED FILL — AMITRIPTYLINE HCL 25 MG TAB: 25 | 30 days supply | Qty: 30 | Fill #0

## 2019-07-06 LAB — CULTURE, URINE COMPREHENSIVE

## 2019-07-10 ENCOUNTER — Other Ambulatory Visit: Payer: Self-pay | Admitting: Family Medicine

## 2019-07-10 DIAGNOSIS — R110 Nausea: Secondary | ICD-10-CM

## 2019-07-11 ENCOUNTER — Telehealth: Payer: Self-pay | Admitting: Urology

## 2019-07-11 NOTE — Telephone Encounter (Signed)
Patient notified and will call her PCP

## 2019-07-11 NOTE — Telephone Encounter (Signed)
Would you reach out to Sherri [name] and ask her to schedule an appointment with her PCP regarding her dark stools/GI bleed prior to scheduling her cystoscopy with hydro distention with [place]?

## 2019-07-12 NOTE — Progress Notes (Signed)
Patient ID: Sherri [name], female   DOB: 1986-01-05, 34 y.o.   MRN: [id number]  Virtual Visit via Telephone Note  I connected with Sherri [name] on 07/13/19 at  2:30 PM EDT by telephone and verified that I am speaking with the correct person using two identifiers.   I discussed the limitations, risks, security and privacy concerns of performing an evaluation and management service by telephone and the availability of in person appointments. I also discussed with the patient that there may be a patient responsible charge related to this service. The patient expressed understanding and agreed to proceed.  PATIENT visit by telephone virtually in the context of Covid-19 pandemic. Patient location:  home My Location:  [facility] office Persons on the call:  Me and the patient    History of Present Illness: After urgent care visit 4/21 for abdominal pain, nausea, melena and heme+ stool.  Has appt with GI in 3 weeks.  No more melena.  Sometimes has abdominal cramping and nausea.  No fever.  Appetite is good.  Needs to pick up zofran prescription.     Observations/Objective: NAD.  A&Ox3  Assessment and Plan: 1. Anemia, unspecified type Was stable at [place].  She will come by and have this drawn - CBC with Differential/Platelet; Future  2. Encounter for examination following treatment at hospital  3. Melena Resolved.  Keep GI appt.    Follow Up Instructions: 3 months to PCP    I discussed the assessment and treatment plan with the patient. The patient was provided an opportunity to ask questions and all were answered. The patient agreed with the plan and demonstrated an understanding of the instructions.   The patient was advised to call back or seek an in-person evaluation if the symptoms worsen or if the condition fails to improve as anticipated.  I provided 12 minutes of non-face-to-face time during this encounter.   Freeman Caldron, PA-C

## 2019-07-13 ENCOUNTER — Other Ambulatory Visit: Payer: Self-pay

## 2019-07-13 ENCOUNTER — Ambulatory Visit: Payer: Self-pay | Attending: Family Medicine | Admitting: Physician Assistant

## 2019-07-13 DIAGNOSIS — Z09 Encounter for follow-up examination after completed treatment for conditions other than malignant neoplasm: Secondary | ICD-10-CM

## 2019-07-13 DIAGNOSIS — D649 Anemia, unspecified: Secondary | ICD-10-CM

## 2019-07-13 DIAGNOSIS — K921 Melena: Secondary | ICD-10-CM

## 2019-07-13 MED ORDER — ONDANSETRON 4 MG PO TBDP: 4.0000 mg | ORAL_TABLET | Freq: Three times a day (TID) | ORAL | 0 refills | Status: DC | PRN

## 2019-07-14 NOTE — Addendum Note (Signed)
Addended bySteffanie Dunn on: 07/14/2019 09:44 AM   Modules accepted: Orders

## 2019-07-15 LAB — CBC WITH DIFFERENTIAL/PLATELET
Basophils Absolute: 0 10*3/uL (ref 0.0–0.2)
Basos: 1 %
EOS (ABSOLUTE): 0.2 10*3/uL (ref 0.0–0.4)
Eos: 2 %
Hematocrit: 36 % (ref 34.0–46.6)
Hemoglobin: 11.7 g/dL (ref 11.1–15.9)
Immature Grans (Abs): 0 10*3/uL (ref 0.0–0.1)
Immature Granulocytes: 0 %
Lymphocytes Absolute: 2.8 10*3/uL (ref 0.7–3.1)
Lymphs: 33 %
MCH: 26.4 pg — ABNORMAL LOW (ref 26.6–33.0)
MCHC: 32.5 g/dL (ref 31.5–35.7)
MCV: 81 fL (ref 79–97)
Monocytes Absolute: 0.6 10*3/uL (ref 0.1–0.9)
Monocytes: 7 %
Neutrophils Absolute: 4.7 10*3/uL (ref 1.4–7.0)
Neutrophils: 57 %
Platelets: 334 10*3/uL (ref 150–450)
RBC: 4.43 x10E6/uL (ref 3.77–5.28)
RDW: 15.7 % — ABNORMAL HIGH (ref 11.7–15.4)
WBC: 8.3 10*3/uL (ref 3.4–10.8)

## 2019-07-17 ENCOUNTER — Other Ambulatory Visit: Payer: Self-pay

## 2019-07-17 ENCOUNTER — Ambulatory Visit (HOSPITAL_COMMUNITY)
Admission: EM | Admit: 2019-07-17 | Discharge: 2019-07-17 | Disposition: A | Discharge status: Home / Self Care | Payer: Self-pay | Attending: Internal Medicine | Admitting: Internal Medicine

## 2019-07-17 ENCOUNTER — Encounter (HOSPITAL_COMMUNITY): Payer: Self-pay

## 2019-07-17 DIAGNOSIS — H1013 Acute atopic conjunctivitis, bilateral: Secondary | ICD-10-CM

## 2019-07-17 DIAGNOSIS — M791 Myalgia, unspecified site: Secondary | ICD-10-CM

## 2019-07-17 DIAGNOSIS — J309 Allergic rhinitis, unspecified: Secondary | ICD-10-CM

## 2019-07-17 LAB — POCT URINALYSIS DIP (DEVICE)
Bilirubin Urine: NEGATIVE
Glucose, UA: NEGATIVE mg/dL
Hgb urine dipstick: NEGATIVE
Ketones, ur: NEGATIVE mg/dL
Leukocytes,Ua: NEGATIVE
Nitrite: NEGATIVE
Protein, ur: NEGATIVE mg/dL
Specific Gravity, Urine: >=1.03 (ref 1.005–1.030)
Urobilinogen, UA: 0.2 mg/dL (ref 0.0–1.0)
pH: 5.5 (ref 5.0–8.0)

## 2019-07-17 LAB — POC URINE PREG, ED: Preg Test, Ur: NEGATIVE

## 2019-07-17 MED ORDER — IPRATROPIUM BROMIDE 0.03 % NA SOLN: 2.0000 | Freq: Two times a day (BID) | NASAL | 12 refills | Status: DC

## 2019-07-17 MED ORDER — OLOPATADINE HCL 0.1 % OP SOLN: 1.0000 [drp] | Freq: Two times a day (BID) | OPHTHALMIC | 12 refills | Status: DC

## 2019-07-17 MED ORDER — CYCLOBENZAPRINE HCL 10 MG PO TABS: 10.0000 mg | ORAL_TABLET | Freq: Two times a day (BID) | ORAL | 0 refills | Status: DC | PRN

## 2019-07-17 MED ORDER — LORATADINE 10 MG PO TABS: 10.0000 mg | ORAL_TABLET | Freq: Every day | ORAL | Status: DC

## 2019-07-17 MED FILL — OLOPATADINE HCL 0.1 % SOLN: 0.1 | 18 days supply | Qty: 5 | Fill #0

## 2019-07-17 MED FILL — CYCLOBENZAPRINE 10 MG TAB: 10 | 10 days supply | Qty: 20 | Fill #0

## 2019-07-17 MED FILL — IPRATROPIUM 0.03% SPRAY: 0.03 | 30 days supply | Qty: 30 | Fill #0

## 2019-07-17 MED FILL — PANTOPRAZOLE SOD DR 20 MG T: 20 | 30 days supply | Qty: 60 | Fill #1

## 2019-07-17 NOTE — ED Provider Notes (Signed)
Sherri [name]    CSN: [id number] Arrival date & time: 07/17/19  [id number]      History   Chief Complaint Chief Complaint  Patient presents with  . Urinary Tract Infection    HPI Sherri [name] is a 34 y.o. female comes to the urgent care with complaints of some vaginal itching, dysuria, urgency or frequency.  Symptoms started about a week ago and has been persistent.  No fever or chills.  No flank pain.  No nausea or vomiting.  Symptoms have been persistent.  Patient also complains of some itchy eyes and nose.  No shortness of breath no cough or sputum production.  She has seasonal allergies but does not take any medications regularly.  No cough or sputum production.  No dizziness, near syncope or syncopal episodes.   HPI  Past Medical History:  Diagnosis Date  . Fibromyalgia    denies today  . Genital herpes   . Hypertension    gestational  . 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 Urinary tract infection   . Uterine fibroid     Patient Active Problem List   Diagnosis Date Noted  . Foul smelling vaginal discharge 03/09/2019  . Chronic constipation 03/09/2019  . Pelvic pain in female 03/09/2019  . Exposure to HIV 02/02/2019  . Hypertension   . Genital herpes   . Yeast infection 12/03/2016  . Status post cesarean delivery 06/06/2014  . Uterine fibroid 12/25/2013  . Obesity 12/25/2013  . Screen for STD (sexually transmitted disease) 04/27/2012    Past Surgical History:  Procedure Laterality Date  . CESAREAN SECTION N/A 06/05/2014   Procedure: CESAREAN SECTION;  Surgeon: Truett Mainland, DO;  Location: [facility];  Service: Obstetrics;  Laterality: N/A;  . WISDOM TOOTH EXTRACTION      OB History    Gravida  1   Para  1   Term      Preterm  1   AB      Living  2     SAB      TAB      Ectopic      Multiple  1   Live Births  2            Home Medications    Prior to Admission medications   Medication Sig Start  Date End Date Taking? Authorizing Provider  cyclobenzaprine (FLEXERIL) 10 MG tablet Take 1 tablet (10 mg total) by mouth 2 (two) times daily as needed for muscle spasms. 07/17/19   Chase Picket, MD  fluticasone (FLONASE) 50 MCG/ACT nasal spray Place 2 sprays into both nostrils daily. 05/02/19   Tasia Catchings, Amy V, PA-C  ipratropium (ATROVENT) 0.03 % nasal spray Place 2 sprays into both nostrils every 12 (twelve) hours. 07/17/19   Chase Picket, MD  lansoprazole (PREVACID) 30 MG capsule 1 pill, once daily to reduce stomach acid 04/20/19   Fulp, Cammie, MD  loratadine (CLARITIN) 10 MG tablet Take 1 tablet (10 mg total) by mouth daily. 07/17/19   LampteyMyrene Galas, MD  lubiprostone (AMITIZA) 24 MCG capsule Take 1 capsule (24 mcg total) by mouth 2 (two) times daily with a meal. 04/27/19   Willia Craze, NP  naproxen (NAPROSYN) 500 MG tablet Take 1 tablet (500 mg total) by mouth 2 (two) times daily. 03/25/19   Alveria Apley, PA-C  olopatadine (PATANOL) 0.1 % ophthalmic solution Place 1 drop into both eyes 2 (two) times daily. 07/17/19  Chase Picket, MD  pantoprazole (PROTONIX) 20 MG tablet Take 1 tablet (20 mg total) by mouth 2 (two) times daily. 06/21/19 07/21/19  Chase Picket, MD  polyethylene glycol powder (GLYCOLAX/MIRALAX) 17 GM/SCOOP powder Mix one scoop 17 gm with water and mix with 8 or more ounces of liquid once per day 04/06/19   Fulp, Cammie, MD  amitriptyline (ELAVIL) 25 MG tablet TAKE 1 TABLET (25 MG TOTAL) BY MOUTH AT BEDTIME. Patient not taking: Reported on 07/13/2019 07/05/19 07/17/19  Zara Council A, PA-C  azelastine (ASTELIN) 0.1 % nasal spray Place 2 sprays into both nostrils 2 (two) times daily. 05/02/19 07/17/19  Ok Edwards, PA-C  esomeprazole (NEXIUM) 40 MG capsule Take 1 capsule (40 mg total) by mouth daily. To reduce stomach acid 03/07/18 04/20/19  Fulp, Cammie, MD  mirabegron ER (MYRBETRIQ) 25 MG TB24 tablet Take 1 tablet (25 mg total) by mouth daily. Patient not taking: Reported on  07/13/2019 05/09/19 07/17/19  Nori Riis, PA-C    Family History Family History  Problem Relation Age of Onset  . Hypertension Mother   . Hypertension Father   . Breast cancer Maternal Grandmother        43  . Colon cancer Neg Hx   . Colon polyps Neg Hx   . Kidney disease Neg Hx   . Diabetes Neg Hx   . Esophageal cancer Neg Hx   . Gallbladder disease Neg Hx   . Heart disease Neg Hx   . Asthma Neg Hx   . Cancer Neg Hx   . Stroke Neg Hx     Social History Social History   Tobacco Use  . Smoking status: Never Smoker  . Smokeless tobacco: Never Used  Substance Use Topics  . Alcohol use: No  . Drug use: No     Allergies   Macrobid [nitrofurantoin]   Review of Systems Review of Systems  HENT: Positive for congestion. Negative for sinus pressure and sinus pain.   Eyes: Positive for itching. Negative for photophobia, redness and visual disturbance.  Respiratory: Negative for cough and shortness of breath.   Cardiovascular: Negative for chest pain and palpitations.  Gastrointestinal: Negative.  Negative for abdominal pain, diarrhea, nausea and vomiting.  Genitourinary: Negative.   Neurological: Negative for dizziness, light-headedness and headaches.     Physical Exam Triage Vital Signs ED Triage Vitals  Enc Vitals Group     BP 07/17/19 0845 (!) 141/68     Pulse Rate 07/17/19 0845 66     Resp 07/17/19 0845 18     Temp 07/17/19 0845 98.5 F (36.9 C)     Temp Source 07/17/19 0845 Oral     SpO2 07/17/19 0845 100 %     Weight 07/17/19 0844 190 lb (86.2 kg)     Height --      Head Circumference --      Peak Flow --      Pain Score 07/17/19 0844 0     Pain Loc --      Pain Edu? --      Excl. in [place]? --    No data found.  Updated Vital Signs BP (!) 141/68 (BP Location: Right Arm)   Pulse 66   Temp 98.5 F (36.9 C) (Oral)   Resp 18   Wt 86.2 kg   LMP 06/20/2019   SpO2 100%   BMI 38.38 kg/m   Visual Acuity Right Eye Distance:   Left Eye Distance:    Bilateral  Distance:    Right Eye Near:   Left Eye Near:    Bilateral Near:     Physical Exam   UC Treatments / Results  Labs (all labs ordered are listed, but only abnormal results are displayed) Labs Reviewed  POCT URINALYSIS DIP (DEVICE)  POC URINE PREG, ED    EKG   Radiology No results found.  Procedures Procedures (including critical care time)  Medications Ordered in UC Medications - No data to display  Initial Impression / Assessment and Plan / UC Course  I have reviewed the triage vital signs and the nursing notes.  Pertinent labs & imaging results that were available during my care of the patient were reviewed by me and considered in my medical decision making (see chart for details).    1.  Allergic conjunctivitis and rhinitis: Ipratropium nasal spray Patanol ophthalmic solution Claritin daily basis  2.  Myalgias with muscle spasm: Cyclobenzaprine as needed for muscle spasms Point-of-care urinalysis is negative for urinary tract infection Patient is advised to push oral fluids. Return precautions given. Final Clinical Impressions(s) / UC Diagnoses   Final diagnoses:  Allergic conjunctivitis and rhinitis, bilateral  Myalgia   Discharge Instructions   None    ED Prescriptions    Medication Sig Dispense Auth. Provider   ipratropium (ATROVENT) 0.03 % nasal spray Place 2 sprays into both nostrils every 12 (twelve) hours. 30 mL Emelina Hinch, Myrene Galas, MD   olopatadine (PATANOL) 0.1 % ophthalmic solution Place 1 drop into both eyes 2 (two) times daily. 5 mL Avyukth Bontempo, Myrene Galas, MD   cyclobenzaprine (FLEXERIL) 10 MG tablet Take 1 tablet (10 mg total) by mouth 2 (two) times daily as needed for muscle spasms. 20 tablet Kanyah Matsushima, Myrene Galas, MD   loratadine (CLARITIN) 10 MG tablet Take 1 tablet (10 mg total) by mouth daily.  Purvi Ruehl, Myrene Galas, MD     PDMP not reviewed this encounter.   Chase Picket, MD 07/19/19 1423

## 2019-07-17 NOTE — ED Triage Notes (Signed)
Pt is here with vaginal odor, burning, discharge, itching for a week now. Pt has not taken anything to relieve discomfort.

## 2019-07-19 ENCOUNTER — Telehealth: Payer: Self-pay | Admitting: Urology

## 2019-07-19 NOTE — Telephone Encounter (Signed)
I contacted Sherri [name] today and discussed that we will go forward with cystoscopy hydrodistention for fulguration of her bladder.  I explained the procedure to the patient.  She had no further questions.  I did advise her that if she did not see improvement in her symptoms are neck step would be to refer her onto Dr. Alona Bene in [facility] for further evaluation and management.  She is agreeable.

## 2019-07-20 ENCOUNTER — Telehealth: Payer: Self-pay | Admitting: Radiology

## 2019-07-20 ENCOUNTER — Encounter: Payer: Self-pay | Admitting: *Deleted

## 2019-07-20 ENCOUNTER — Other Ambulatory Visit: Payer: Self-pay | Admitting: Radiology

## 2019-07-20 DIAGNOSIS — R102 Pelvic and perineal pain: Secondary | ICD-10-CM

## 2019-07-20 NOTE — Telephone Encounter (Signed)
Discussed hydrodistention and fulgeration with Dr Bernardo Heater along with appointments and instructions. Patient verbalized understanding.

## 2019-07-20 NOTE — Telephone Encounter (Signed)
Unable to reach patient by phone. No voicemail set up. Need to discuss surgery with Dr Bernardo Heater.

## 2019-07-25 ENCOUNTER — Other Ambulatory Visit: Payer: Self-pay

## 2019-07-25 ENCOUNTER — Other Ambulatory Visit: Payer: Self-pay | Admitting: Family Medicine

## 2019-07-25 DIAGNOSIS — R102 Pelvic and perineal pain: Secondary | ICD-10-CM

## 2019-07-25 MED FILL — ONDANSETRON HCL 4 MG TABLET: 4 | 6 days supply | Qty: 20 | Fill #0

## 2019-07-26 ENCOUNTER — Inpatient Hospital Stay: Admission: RE | Admit: 2019-07-26 | Payer: Self-pay | Source: Ambulatory Visit

## 2019-07-26 LAB — URINALYSIS, COMPLETE
Bilirubin, UA: NEGATIVE
Glucose, UA: NEGATIVE
Ketones, UA: NEGATIVE
Leukocytes,UA: NEGATIVE
Nitrite, UA: NEGATIVE
Protein,UA: NEGATIVE
Specific Gravity, UA: 1.025 (ref 1.005–1.030)
Urobilinogen, Ur: 0.2 mg/dL (ref 0.2–1.0)
pH, UA: 5.5 (ref 5.0–7.5)

## 2019-07-26 LAB — MICROSCOPIC EXAMINATION

## 2019-07-28 ENCOUNTER — Other Ambulatory Visit: Payer: Self-pay

## 2019-07-29 LAB — CULTURE, URINE COMPREHENSIVE

## 2019-08-01 ENCOUNTER — Ambulatory Visit: Admit: 2019-08-01 | Payer: Self-pay | Admitting: Urology

## 2019-08-01 SURGERY — CYSTOSCOPY, WITH BLADDER HYDRODISTENSION
Anesthesia: General

## 2019-08-02 ENCOUNTER — Ambulatory Visit (INDEPENDENT_AMBULATORY_CARE_PROVIDER_SITE_OTHER): Payer: Self-pay | Admitting: Nurse Practitioner

## 2019-08-02 ENCOUNTER — Encounter: Payer: Self-pay | Admitting: Nurse Practitioner

## 2019-08-02 VITALS — BP 124/76 | Ht 59.0 in | Wt 196.0 lb

## 2019-08-02 DIAGNOSIS — R112 Nausea with vomiting, unspecified: Secondary | ICD-10-CM

## 2019-08-02 DIAGNOSIS — K921 Melena: Secondary | ICD-10-CM

## 2019-08-02 NOTE — Progress Notes (Signed)
IMPRESSION and PLAN:    34 yo female with pmh significant for but not limited to fibromyalgia, hypertension, obesity, chronic constipation  # Chronic intermittent abdominal pain, nausea, and now with vomiting and reported black stool. --Ongoing intermittent diffuse abdominal pain, nausea and vomiting.  No hematemesis --Black stool started 06/21/19. Still periodically having black stool.  No stool in vault today on exam. Hgb has remained at baseline ( mid 11 range)  --Continue daily PPI --She has no history of significant NSAID use --For further evaluation patient will be scheduled for EGD. The risks and benefits of EGD were discussed and the patient agrees to proceed.    HPI:    Primary GI:  Lucio Edward, MD  Chief complaint : black stool, nausea, vomiting , upper abdominal pain.   04/24/19 GI office visit for nausea, generalized abdominal pain and constipation.  Frequent labs from multiple ED visits for various reasons were unremarkable.  Plan was to treat constipation and see if nausea and abdominal pain improved.  Advised to increase fluid intake, start Benefiber.  She was given samples of Amitiza but if not covered by insurance then she would take MiraLAX.   06/21/19 seen in urgent care for abdominal pain, nausea, melena.  She was hemodynamically stable.  Hemoglobin was 12.4 ( above her baseline).  BUN normal at 10 . Prescribed Protonix and Zofran  07/13/19 telehealth visit with PCP for follow-up on GI bleed.  Melena had resolved.  Advised to keep GI appointment.  Labs drawn and hemoglobin at baseline (11.7)  Patient supposed to be undergoing cystoscopy soon but it appears that has been on hold due to the need for GI evaluation for possible GI bleed.    INTERIM HISTORY:  Sherri [name] says she continues to have black loose stool periodically, other times bowel movements are normal.  Denies bismuth use.  Denies iron use.  She continues to have periodic diffuse upper  abdominal pain, nausea / vomiting especially after meals.  She denies hematemesis.  Denies NSAID use.  She is still taking daily Protonix. Weight is overall stable over the last month.    Review of systems:     No chest pain, no SOB, no fevers, no urinary sx   Past Medical History:  Diagnosis Date  . Fibromyalgia    denies today  . Genital herpes   . Hypertension    gestational  . 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 Urinary tract infection   . Uterine fibroid     Patient's surgical history, family medical history, social history, medications and allergies were all reviewed in Epic   Creatinine clearance cannot be calculated (Patient's most recent lab result is older than the maximum 21 days allowed.)  Current Outpatient Medications  Medication Sig Dispense Refill  . cyclobenzaprine (FLEXERIL) 10 MG tablet Take 1 tablet (10 mg total) by mouth 2 (two) times daily as needed for muscle spasms. 20 tablet 0  . fluticasone (FLONASE) 50 MCG/ACT nasal spray Place 2 sprays into both nostrils daily. 1 g 0  . ipratropium (ATROVENT) 0.03 % nasal spray Place 2 sprays into both nostrils every 12 (twelve) hours. 30 mL 12  . loratadine (CLARITIN) 10 MG tablet Take 1 tablet (10 mg total) by mouth daily. (Patient taking differently: Take 10 mg by mouth daily as needed for allergies. )    . lubiprostone (AMITIZA) 24 MCG capsule Take 1 capsule (24 mcg total) by mouth 2 (  two) times daily with a meal. 60 capsule 3  . naproxen (NAPROSYN) 500 MG tablet Take 1 tablet (500 mg total) by mouth 2 (two) times daily. 30 tablet 0  . olopatadine (PATANOL) 0.1 % ophthalmic solution Place 1 drop into both eyes 2 (two) times daily. (Patient taking differently: Place 1 drop into both eyes 2 (two) times daily as needed for allergies. ) 5 mL 12  . ondansetron (ZOFRAN) 4 MG tablet TAKE 1 TABLET (4 MG TOTAL) BY MOUTH EVERY 8 (EIGHT) HOURS AS NEEDED FOR NAUSEA OR VOMITING. 20 tablet 0  . polyethylene  glycol powder (GLYCOLAX/MIRALAX) 17 GM/SCOOP powder Mix one scoop 17 gm with water and mix with 8 or more ounces of liquid once per day 578 g 11  . pantoprazole (PROTONIX) 20 MG tablet Take 1 tablet (20 mg total) by mouth 2 (two) times daily. 60 tablet 1   No current facility-administered medications for this visit.    Physical Exam:     Ht 4\' 11"  (1.499 m)   Wt 196 lb (88.9 kg)   BMI 39.59 kg/m   GENERAL:  Pleasant female in NAD PSYCH: : Cooperative, normal affect CARDIAC:  RRR PULM: Normal respiratory effort, lungs CTA bilaterally, no wheezing ABDOMEN:  Nondistended, soft, nontender. No obvious masses, no hepatomegaly,  normal bowel sounds SKIN:  turgor, no lesions seen Musculoskeletal:  Normal muscle tone, normal strength NEURO: Alert and oriented x 3, no focal neurologic deficits   Tye Savoy , NP 08/02/2019, 3:41 PM

## 2019-08-02 NOTE — Patient Instructions (Addendum)
If you are age 33 or older, your body mass index should be between 23-30. Your Body mass index is 39.59 kg/m. If this is out of the aforementioned range listed, please consider follow up with your Primary Care Provider.  If you are age 90 or younger, your body mass index should be between 19-25. Your Body mass index is 39.59 kg/m. If this is out of the aformentioned range listed, please consider follow up with your Primary Care Provider.   Continue Pantoprazole 20 mg daily 30-60 minutes before breakfast.   You have been scheduled for an endoscopy. Please follow written instructions given to you at your visit today. If you use inhalers (even only as needed), please bring them with you on the day of your procedure.

## 2019-08-03 ENCOUNTER — Other Ambulatory Visit: Payer: Self-pay | Admitting: Family Medicine

## 2019-08-06 NOTE — Progress Notes (Signed)
Reviewed and agree with management plan.  Braniyah Besse T. Monchel Pollitt, MD FACG  Gastroenterology  

## 2019-08-07 ENCOUNTER — Other Ambulatory Visit: Payer: Self-pay | Admitting: Family Medicine

## 2019-08-10 ENCOUNTER — Encounter: Payer: Self-pay | Admitting: Urology

## 2019-08-10 ENCOUNTER — Other Ambulatory Visit: Payer: Self-pay

## 2019-08-10 ENCOUNTER — Ambulatory Visit: Payer: Self-pay | Attending: Family Medicine | Admitting: Physician Assistant

## 2019-08-10 ENCOUNTER — Telehealth (INDEPENDENT_AMBULATORY_CARE_PROVIDER_SITE_OTHER): Payer: Self-pay | Admitting: Urology

## 2019-08-10 DIAGNOSIS — R102 Pelvic and perineal pain: Secondary | ICD-10-CM

## 2019-08-10 DIAGNOSIS — H9201 Otalgia, right ear: Secondary | ICD-10-CM

## 2019-08-10 DIAGNOSIS — R399 Unspecified symptoms and signs involving the genitourinary system: Secondary | ICD-10-CM

## 2019-08-10 DIAGNOSIS — R300 Dysuria: Secondary | ICD-10-CM

## 2019-08-10 DIAGNOSIS — R110 Nausea: Secondary | ICD-10-CM

## 2019-08-10 LAB — POCT URINALYSIS DIP (CLINITEK)
Bilirubin, UA: NEGATIVE
Glucose, UA: NEGATIVE mg/dL
Ketones, POC UA: NEGATIVE mg/dL
Leukocytes, UA: NEGATIVE
Nitrite, UA: NEGATIVE
POC PROTEIN,UA: NEGATIVE
Spec Grav, UA: 1.015 (ref 1.010–1.025)
Urobilinogen, UA: 0.2 E.U./dL
pH, UA: 6.5 (ref 5.0–8.0)

## 2019-08-10 LAB — POCT URINE PREGNANCY: Preg Test, Ur: NEGATIVE

## 2019-08-10 MED ORDER — AMOXICILLIN 500 MG PO CAPS: 500.0000 mg | ORAL_CAPSULE | Freq: Three times a day (TID) | ORAL | 0 refills | Status: DC

## 2019-08-10 MED ORDER — FLUCONAZOLE 150 MG PO TABS: 150.0000 mg | ORAL_TABLET | Freq: Once | ORAL | 0 refills | Status: AC

## 2019-08-10 MED ORDER — PANTOPRAZOLE SODIUM 20 MG PO TBEC: 20.0000 mg | DELAYED_RELEASE_TABLET | Freq: Two times a day (BID) | ORAL | 1 refills | Status: DC

## 2019-08-10 MED ORDER — ONDANSETRON HCL 4 MG PO TABS: ORAL_TABLET | ORAL | 0 refills | Status: DC

## 2019-08-10 NOTE — Progress Notes (Signed)
Virtual Visit via Telephone Note  I connected with [name] on 08/10/19 at  1:50 PM EDT by telephone and verified that I am speaking with the correct person using two identifiers.   I discussed the limitations, risks, security and privacy concerns of performing an evaluation and management service by telephone and the availability of in person appointments. I also discussed with the patient that there may be a patient responsible charge related to this service. The patient expressed understanding and agreed to proceed.  PATIENT visit by telephone virtually in the context of Covid-19 pandemic. Patient location:  home My Location:  [place] office Persons on the call:  Me and the patient  History of Present Illness: Patient is c/o urinary frequency and burning X 1 week.  Some abdominal pain which she is seeing GI for 08/02/2019.  Also c/o R earache X1 week.  No fever.  +nausea.  No vomiting/diarrhea.  No vaginal discharge.  Period was 3.5 weeks ago.     Observations/Objective:  NAD.  A&Ox3   Assessment and Plan: 1. Dysuria Increase water intake - POCT URINALYSIS DIP (CLINITEK) - amoxicillin (AMOXIL) 500 MG capsule; Take 1 capsule (500 mg total) by mouth 3 (three) times daily.  Dispense: 30 capsule; Refill: 0 - fluconazole (DIFLUCAN) 150 MG tablet; Take 1 tablet (150 mg total) by mouth once for 1 dose.  Dispense: 1 tablet; Refill: 0  2. Nausea Undergoing workup with GI - POCT URINALYSIS DIP (CLINITEK) - Comprehensive metabolic panel - POCT urine pregnancy - ondansetron (ZOFRAN) 4 MG tablet; TAKE 1 TABLET (4 MG TOTAL) BY MOUTH EVERY 8 (EIGHT) HOURS AS NEEDED FOR NAUSEA OR VOMITING.  Dispense: 30 tablet; Refill: 0 - pantoprazole (PROTONIX) 20 MG tablet; Take 1 tablet (20 mg total) by mouth 2 (two) times daily.  Dispense: 60 tablet; Refill: 1  3. Earache on right - amoxicillin (AMOXIL) 500 MG capsule; Take 1 capsule (500 mg total) by mouth 3 (three) times daily.  Dispense: 30 capsule;  Refill: 0    Follow Up Instructions: See PCP prn   I discussed the assessment and treatment plan with the patient. The patient was provided an opportunity to ask questions and all were answered. The patient agreed with the plan and demonstrated an understanding of the instructions.   The patient was advised to call back or seek an in-person evaluation if the symptoms worsen or if the condition fails to improve as anticipated.  I provided 12 minutes of non-face-to-face time during this encounter.   Freeman Caldron, PA-C  Patient ID: Sherri [name], female   DOB: Jan 31, 1986, 34 y.o.   MRN: [id number]

## 2019-08-10 NOTE — Progress Notes (Signed)
Virtual Visit via Telephone Note  I connected with Sherri [name] on 08/10/19 at 10:15 AM EDT by telephone and verified that I am speaking with the correct person using two identifiers.  Location: Patient: Home Provider: [facility]   I discussed the limitations, risks, security and privacy concerns of performing an evaluation and management service by telephone and the availability of in person appointments. I also discussed with the patient that there may be a patient responsible charge related to this service. The patient expressed understanding and agreed to proceed.   History of Present Illness: 34 yo female most recently seen by [facility] for storage related voiding symptoms and pelvic pain.  Also with dyspareunia and chronic low back pain.  Has been on Uribel, solifenacin, Myrbetriq and amitriptyline without relief  Office cystoscopy earlier this year was unremarkable.  Sherri [name] had discussed hydrodistention however the patient states she lives in [place] and does not have transportation to get to [place] for the procedure and cannot do.   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 Amalia [name] in his [place] office.  Referral placed   I discussed the assessment and treatment plan with the patient. The patient was provided an opportunity to ask questions and all were answered. The patient agreed with the plan and demonstrated an understanding of the instructions.   The patient was advised to call back or seek an in-person evaluation if the symptoms worsen or if the condition fails to improve as anticipated.  I provided 11 minutes of non-face-to-face time during this encounter.   Sherri Sons, MD

## 2019-08-10 NOTE — Progress Notes (Signed)
Abdominal pain and dysuria

## 2019-08-11 LAB — COMPREHENSIVE METABOLIC PANEL
ALT: 15 IU/L (ref 0–32)
AST: 18 IU/L (ref 0–40)
Albumin/Globulin Ratio: 1.5 (ref 1.2–2.2)
Albumin: 4.3 g/dL (ref 3.8–4.8)
Alkaline Phosphatase: 97 IU/L (ref 48–121)
BUN/Creatinine Ratio: 11 (ref 9–23)
BUN: 9 mg/dL (ref 6–20)
Bilirubin Total: 0.2 mg/dL (ref 0.0–1.2)
CO2: 21 mmol/L (ref 20–29)
Calcium: 9.4 mg/dL (ref 8.7–10.2)
Chloride: 103 mmol/L (ref 96–106)
Creatinine, Ser: 0.83 mg/dL (ref 0.57–1.00)
GFR calc Af Amer: 107 mL/min/{1.73_m2} (ref >59)
GFR calc non Af Amer: 93 mL/min/{1.73_m2} (ref >59)
Globulin, Total: 2.9 g/dL (ref 1.5–4.5)
Glucose: 89 mg/dL (ref 65–99)
Potassium: 4 mmol/L (ref 3.5–5.2)
Sodium: 139 mmol/L (ref 134–144)
Total Protein: 7.2 g/dL (ref 6.0–8.5)

## 2019-08-15 ENCOUNTER — Telehealth: Payer: Self-pay | Admitting: Family Medicine

## 2019-08-15 NOTE — Telephone Encounter (Signed)
Patient called saying that she was prescribed amoxicillin (AMOXIL) 500 MG capsule  And she has diarrhea, back pain, and stomach pain. Patient thinks the medication caused her to have those problems. Please f/u

## 2019-08-15 NOTE — Telephone Encounter (Signed)
Pt took prescribed med for the past 5 days start experiencing the above sy 2 days ago. Denies hives, diff breathing, SOB, chest pains  or any allergic reactions. Explained pt that amoxicillin may cause diarrhea and stomach discomfort as some of commun SE. If upsets stomach take it with food. Had 3 episodes of diarrhea yesterday and 2 today. Denies vomiting or nausea. Advised to increase fluid intake and will f/u with PCP.  Please advise!

## 2019-08-16 NOTE — Telephone Encounter (Signed)
She can take immodium if needed for diarrhea.  If persists, she can stop the amoxicillin bc she has probably had enough days to be effective.  Thanks, Freeman Caldron, PA-C

## 2019-08-16 NOTE — Telephone Encounter (Signed)
Made pt aware of given message.

## 2019-08-17 ENCOUNTER — Encounter: Payer: Self-pay | Admitting: *Deleted

## 2019-08-18 MED FILL — PANTOPRAZOLE SOD DR 20 MG T: 20 | 30 days supply | Qty: 60 | Fill #0

## 2019-08-21 ENCOUNTER — Telehealth: Payer: Self-pay | Admitting: Gastroenterology

## 2019-08-21 MED FILL — IPRATROPIUM 0.03% SPRAY: 0.03 | 30 days supply | Qty: 30 | Fill #1

## 2019-08-21 MED FILL — ONDANSETRON ODT 4 MG TABLET: 4 | 6 days supply | Qty: 20 | Fill #0

## 2019-08-21 NOTE — Telephone Encounter (Signed)
Forwarding to PG who saw this patient recently as an [place].

## 2019-08-21 NOTE — Telephone Encounter (Signed)
Ok, will see if he has anything to add but if still having black stools in absence of iron or bismuth then EGD is warranted. Thanks

## 2019-08-21 NOTE — Telephone Encounter (Signed)
Hey Dr Fuller Plan, pt called to cancel her upcoming Endoscopy procedure scheduled for 6/24. She states she would like to discuss other alternative treatments with you first, She is scheduled for a consult on 8/6.

## 2019-08-24 ENCOUNTER — Encounter: Payer: Self-pay | Admitting: Gastroenterology

## 2019-08-30 ENCOUNTER — Other Ambulatory Visit: Payer: Self-pay

## 2019-08-30 ENCOUNTER — Encounter (HOSPITAL_COMMUNITY): Payer: Self-pay | Admitting: Urgent Care

## 2019-08-30 ENCOUNTER — Ambulatory Visit (HOSPITAL_COMMUNITY)
Admission: EM | Admit: 2019-08-30 | Discharge: 2019-08-30 | Disposition: A | Discharge status: Home / Self Care | Payer: Self-pay | Attending: Urgent Care | Admitting: Urgent Care

## 2019-08-30 ENCOUNTER — Ambulatory Visit (INDEPENDENT_AMBULATORY_CARE_PROVIDER_SITE_OTHER): Payer: Self-pay

## 2019-08-30 DIAGNOSIS — R1031 Right lower quadrant pain: Secondary | ICD-10-CM

## 2019-08-30 DIAGNOSIS — K1379 Other lesions of oral mucosa: Secondary | ICD-10-CM | POA: Insufficient documentation

## 2019-08-30 DIAGNOSIS — Z3202 Encounter for pregnancy test, result negative: Secondary | ICD-10-CM

## 2019-08-30 DIAGNOSIS — R300 Dysuria: Secondary | ICD-10-CM | POA: Insufficient documentation

## 2019-08-30 DIAGNOSIS — R350 Frequency of micturition: Secondary | ICD-10-CM | POA: Insufficient documentation

## 2019-08-30 DIAGNOSIS — K5900 Constipation, unspecified: Secondary | ICD-10-CM | POA: Insufficient documentation

## 2019-08-30 DIAGNOSIS — R109 Unspecified abdominal pain: Secondary | ICD-10-CM | POA: Insufficient documentation

## 2019-08-30 DIAGNOSIS — K1370 Unspecified lesions of oral mucosa: Secondary | ICD-10-CM

## 2019-08-30 DIAGNOSIS — M549 Dorsalgia, unspecified: Secondary | ICD-10-CM

## 2019-08-30 DIAGNOSIS — B20 Human immunodeficiency virus [HIV] disease: Secondary | ICD-10-CM | POA: Insufficient documentation

## 2019-08-30 LAB — POCT URINALYSIS DIP (DEVICE)
Glucose, UA: NEGATIVE mg/dL
Leukocytes,Ua: NEGATIVE
Nitrite: NEGATIVE
Protein, ur: 30 mg/dL — AB
Specific Gravity, Urine: >=1.03 (ref 1.005–1.030)
Urobilinogen, UA: 1 mg/dL (ref 0.0–1.0)
pH: 5.5 (ref 5.0–8.0)

## 2019-08-30 LAB — POC URINE PREG, ED: Preg Test, Ur: NEGATIVE

## 2019-08-30 MED ORDER — POLYETHYLENE GLYCOL 3350 17 G PO PACK: 17.0000 g | PACK | Freq: Every day | ORAL | 0 refills | Status: DC | PRN

## 2019-08-30 MED ORDER — NYSTATIN 100000 UNIT/ML MT SUSP
500000.0000 [IU] | Freq: Four times a day (QID) | OROMUCOSAL | 0 refills | Status: DC
Start: 2019-08-30 — End: 2019-11-26

## 2019-08-30 MED FILL — ?POLYETHYLENE GLYCOL 3350 P: 17 | 10 days supply | Qty: 10 | Fill #0

## 2019-08-30 MED FILL — NYSTATIN 100,000 UNITS/ML S: 100000 | 10 days supply | Qty: 200 | Fill #0

## 2019-08-30 NOTE — ED Provider Notes (Signed)
[place]   MRN: [id number] DOB: Jul 22, 1985  Subjective:   Sherri [name] is a 34 y.o. female presenting for 3-day history of recurrent persistent constant severe right-sided belly pain, urinary frequency and dysuria.  Patient has longstanding history of constipation.  Admits that she eats out a lot, does a lot of fast food.  She has a GI doctor, follow-up is coming up in August.  Has not had a bowel movement in 4 to 5 days.  She is sexually active, has one partner and does not use condoms for protection.  No current facility-administered medications for this encounter.  Current Outpatient Medications:  .  cyclobenzaprine (FLEXERIL) 10 MG tablet, Take 1 tablet (10 mg total) by mouth 2 (two) times daily as needed for muscle spasms., Disp: 20 tablet, Rfl: 0 .  fluticasone (FLONASE) 50 MCG/ACT nasal spray, Place 2 sprays into both nostrils daily., Disp: 1 g, Rfl: 0 .  ipratropium (ATROVENT) 0.03 % nasal spray, Place 2 sprays into both nostrils every 12 (twelve) hours., Disp: 30 mL, Rfl: 12 .  loratadine (CLARITIN) 10 MG tablet, Take 1 tablet (10 mg total) by mouth daily. (Patient taking differently: Take 10 mg by mouth daily as needed for allergies. ), Disp:  , Rfl:  .  lubiprostone (AMITIZA) 24 MCG capsule, Take 1 capsule (24 mcg total) by mouth 2 (two) times daily with a meal., Disp: 60 capsule, Rfl: 3 .  naproxen (NAPROSYN) 500 MG tablet, Take 1 tablet (500 mg total) by mouth 2 (two) times daily., Disp: 30 tablet, Rfl: 0 .  olopatadine (PATANOL) 0.1 % ophthalmic solution, Place 1 drop into both eyes 2 (two) times daily. (Patient taking differently: Place 1 drop into both eyes 2 (two) times daily as needed for allergies. ), Disp: 5 mL, Rfl: 12 .  ondansetron (ZOFRAN) 4 MG tablet, TAKE 1 TABLET (4 MG TOTAL) BY MOUTH EVERY 8 (EIGHT) HOURS AS NEEDED FOR NAUSEA OR VOMITING., Disp: 30 tablet, Rfl: 0 .  pantoprazole (PROTONIX) 20 MG tablet, Take 1 tablet (20 mg total) by mouth 2 (two) times  daily., Disp: 60 tablet, Rfl: 1 .  polyethylene glycol powder (GLYCOLAX/MIRALAX) 17 GM/SCOOP powder, Mix one scoop 17 gm with water and mix with 8 or more ounces of liquid once per day, Disp: 578 g, Rfl: 11   Allergies  Allergen Reactions  . Macrobid [Nitrofurantoin] Nausea And Vomiting    Past Medical History:  Diagnosis Date  . Fibromyalgia    denies today  . Genital herpes   . Hypertension    gestational  . 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 Urinary tract infection   . Uterine fibroid      Past Surgical History:  Procedure Laterality Date  . CESAREAN SECTION N/A 06/05/2014   Procedure: CESAREAN SECTION;  Surgeon: Truett Mainland, DO;  Location: [facility];  Service: Obstetrics;  Laterality: N/A;  . WISDOM TOOTH EXTRACTION      Family History  Problem Relation Age of Onset  . Hypertension Mother   . Hypertension Father   . Breast cancer Maternal Grandmother        11  . Colon cancer Neg Hx   . Colon polyps Neg Hx   . Kidney disease Neg Hx   . Diabetes Neg Hx   . Esophageal cancer Neg Hx   . Gallbladder disease Neg Hx   . Heart disease Neg Hx   . Asthma Neg Hx   . Cancer Neg Hx   .  Stroke Neg Hx   . Stomach cancer Neg Hx     Social History   Tobacco Use  . Smoking status: Never Smoker  . Smokeless tobacco: Never Used  Vaping Use  . Vaping Use: Never used  Substance Use Topics  . Alcohol use: No  . Drug use: No    ROS   Objective:   Vitals: BP 128/86   Pulse 90   Temp 98.1 F (36.7 C)   Resp 16   LMP 08/11/2019   SpO2 97%   Physical Exam Constitutional:      General: She is not in acute distress.    Appearance: Normal appearance. She is well-developed and normal weight. She is not ill-appearing, toxic-appearing or diaphoretic.  HENT:     Head: Normocephalic and atraumatic.     Right Ear: External ear normal.     Left Ear: External ear normal.     Nose: Nose normal.     Mouth/Throat:     Mouth: Mucous membranes are  moist.     Pharynx: Oropharynx is clear.  Eyes:     General: No scleral icterus.       Right eye: No discharge.        Left eye: No discharge.     Extraocular Movements: Extraocular movements intact.     Pupils: Pupils are equal, round, and reactive to light.  Cardiovascular:     Rate and Rhythm: Normal rate and regular rhythm.     Heart sounds: Normal heart sounds. No murmur heard.  No friction rub. No gallop.   Pulmonary:     Effort: Pulmonary effort is normal. No respiratory distress.     Breath sounds: Normal breath sounds. No stridor. No wheezing, rhonchi or rales.  Abdominal:     General: Bowel sounds are normal. There is no distension.     Palpations: Abdomen is soft. There is no mass.     Tenderness: There is abdominal tenderness (right sided). There is no right CVA tenderness, left CVA tenderness, guarding or rebound.  Skin:    General: Skin is warm and dry.     Coloration: Skin is not pale.     Findings: No rash.  Neurological:     General: No focal deficit present.     Mental Status: She is alert and oriented to person, place, and time.  Psychiatric:        Mood and Affect: Mood normal.        Behavior: Behavior normal.        Thought Content: Thought content normal.        Judgment: Judgment normal.     Results for orders placed or performed during the hospital encounter of 08/30/19 (from the past 24 hour(s))  POCT urinalysis dip (device)     Status: Abnormal   Collection Time: 08/30/19  8:20 AM  Result Value Ref Range   Glucose, UA NEGATIVE NEGATIVE mg/dL   Bilirubin Urine SMALL (A) NEGATIVE   Ketones, ur TRACE (A) NEGATIVE mg/dL   Specific Gravity, Urine >=1.030 1.005 - 1.030   Hgb urine dipstick TRACE (A) NEGATIVE   pH 5.5 5.0 - 8.0   Protein, ur 30 (A) NEGATIVE mg/dL   Urobilinogen, UA 1.0 0.0 - 1.0 mg/dL   Nitrite NEGATIVE NEGATIVE   Leukocytes,Ua NEGATIVE NEGATIVE  POC urine pregnancy     Status: None   Collection Time: 08/30/19  8:28 AM  Result  Value Ref Range   Preg Test, Ur NEGATIVE NEGATIVE  DG Abd 1 View  Result Date: 08/30/2019 CLINICAL DATA:  Right lower quadrant abdominal pain for 1 week. EXAM: ABDOMEN - 1 VIEW COMPARISON:  05/09/2019 FINDINGS: Moderate to large amount of stool noted in the right colon and in the right aspect of the transverse colon. No distended small bowel loops to suggest obstruction. The soft tissue shadows are grossly maintained. No worrisome calcifications. The bony structures are unremarkable. IMPRESSION: Moderate to large amount of stool in the right colon. No findings for small bowel obstruction or free air. Electronically Signed   By: Marijo Sanes M.D.   On: 08/30/2019 09:25    Assessment and Plan :   PDMP not reviewed this encounter.  1. Right sided abdominal pain   2. Urinary frequency   3. Dysuria   4. Constipation, unspecified constipation type   5. Oral pain   6. HIV disease ([place])     Emphasized need for significant dietary modifications given large stool burden of the right side.  Use MiraLAX daily as needed.  No sign of urinary tract infection.  Patient is to use nystatin for possible oral thrush given her HIV disease. Counseled patient on potential for adverse effects with medications prescribed/recommended today, strict ER and return-to-clinic precautions discussed, patient verbalized understanding.    Jaynee Eagles, [place] 08/30/19 1042

## 2019-08-30 NOTE — ED Triage Notes (Addendum)
Pt c/o RLQ abd pain and back pain x 3 days with painful urination.   Pt also c/o white patches in mouth x 1 week.

## 2019-08-31 ENCOUNTER — Telehealth (HOSPITAL_COMMUNITY): Payer: Self-pay | Admitting: Orthopedic Surgery

## 2019-08-31 LAB — URINE CULTURE: Culture: <10000 — AB

## 2019-08-31 LAB — CERVICOVAGINAL ANCILLARY ONLY
Bacterial Vaginitis (gardnerella): POSITIVE — AB
Candida Glabrata: NEGATIVE
Candida Vaginitis: NEGATIVE
Chlamydia: NEGATIVE
Comment: NEGATIVE
Comment: NEGATIVE
Comment: NEGATIVE
Comment: NEGATIVE
Comment: NEGATIVE
Comment: NORMAL
Neisseria Gonorrhea: NEGATIVE
Trichomonas: NEGATIVE

## 2019-08-31 MED ORDER — METRONIDAZOLE 0.75 % VA GEL: 1.0000 | Freq: Every evening | VAGINAL | 0 refills | Status: DC

## 2019-08-31 MED FILL — metroNIDAZOLE 0.75 % GEL: 0.75 | 5 days supply | Qty: 70 | Fill #0

## 2019-09-05 ENCOUNTER — Other Ambulatory Visit: Payer: Self-pay | Admitting: Physician Assistant

## 2019-09-05 MED FILL — PANTOPRAZOLE SOD DR 20 MG T: 20 | 30 days supply | Qty: 60 | Fill #1

## 2019-09-05 NOTE — Telephone Encounter (Signed)
Requested medication (s) are due for refill today: yes  Requested medication (s) are on the active medication list: yes  Last refill:  08/10/19 #30  Future visit scheduled: no  Notes to clinic:  Please review for refill. Refill not delegated per protocol.     Requested Prescriptions  Pending Prescriptions Disp Refills   ondansetron (ZOFRAN-ODT) 4 MG disintegrating tablet [Pharmacy Med Name: ONDANSETRON ODT 4 MG TABLET 4 Tablet] 20 tablet 0    Sig: Take 1 tablet (4 mg total) by mouth every 8 (eight) hours as needed for nausea or vomiting.      Not Delegated - Gastroenterology: Antiemetics Failed - 09/05/2019  9:59 AM      Failed - This refill cannot be delegated      Passed - Valid encounter within last 6 months    Recent Outpatient Visits           3 weeks ago [place], [place], [place]   1 month ago Anemia, unspecified type   [facility] [place], [place], [place]   4 months ago Chronic constipation   [place], [place], MD   5 months ago [place], MD   5 months ago Chronic constipation   [place], Vernia Buff, NP

## 2019-09-08 MED FILL — ONDANSETRON ODT 4 MG TABLET: 4 | 10 days supply | Qty: 30 | Fill #0

## 2019-09-11 MED FILL — FLUTICASONE PROP 50 MCG SPR: 50 | 30 days supply | Qty: 16 | Fill #0

## 2019-09-12 ENCOUNTER — Other Ambulatory Visit: Payer: Self-pay

## 2019-09-12 ENCOUNTER — Encounter: Payer: Self-pay | Admitting: Emergency Medicine

## 2019-09-12 ENCOUNTER — Ambulatory Visit
Admission: EM | Admit: 2019-09-12 | Discharge: 2019-09-12 | Disposition: A | Discharge status: Home / Self Care | Payer: Self-pay | Attending: Emergency Medicine | Admitting: Emergency Medicine

## 2019-09-12 DIAGNOSIS — G43809 Other migraine, not intractable, without status migrainosus: Secondary | ICD-10-CM

## 2019-09-12 LAB — POCT URINE PREGNANCY: Preg Test, Ur: NEGATIVE

## 2019-09-12 LAB — POCT FASTING CBG KUC MANUAL ENTRY: POCT Glucose (KUC): 118 mg/dL — AB (ref 70–99)

## 2019-09-12 MED ORDER — ONDANSETRON 4 MG PO TBDP
4.0000 mg | ORAL_TABLET | Freq: Once | ORAL | Status: AC
  Administered 2019-09-12: 4 mg via ORAL

## 2019-09-12 MED ORDER — KETOROLAC TROMETHAMINE 30 MG/ML IJ SOLN
30.0000 mg | Freq: Once | INTRAMUSCULAR | Status: AC
  Administered 2019-09-12: 30 mg via INTRAMUSCULAR

## 2019-09-12 MED ORDER — DEXAMETHASONE SODIUM PHOSPHATE 10 MG/ML IJ SOLN
10.0000 mg | Freq: Once | INTRAMUSCULAR | Status: AC
  Administered 2019-09-12: 10 mg via INTRAMUSCULAR

## 2019-09-12 NOTE — ED Provider Notes (Signed)
[facility]    CSN: [id number] Arrival date & time: 09/12/19  1509      History   Chief Complaint Chief Complaint  Patient presents with  . Weakness  . Nausea    HPI Sherri [name] is a 34 y.o. female with history of gestational hypertension, fibromyalgia, uterine fibroids presenting for 3-day course of fatigue, nausea, dizziness.  Endorsing a "feeling off" sensation.  No vomiting, chest pain, cough, palpitations, difficulty breathing.  Denies severe abdominal pain, change in bowel or bladder habit.  Has taken Zofran at home which is helped some.  Patient does note mild headache: Denies thunderclap headache, worse headache of life, visual or auditory changes, tinnitus.  No numbness, weakness, slurred speech.   Past Medical History:  Diagnosis Date  . Fibromyalgia    denies today  . Genital herpes   . Hypertension    gestational  . 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 Urinary tract infection   . Uterine fibroid     Patient Active Problem List   Diagnosis Date Noted  . Foul smelling vaginal discharge 03/09/2019  . Chronic constipation 03/09/2019  . Pelvic pain in female 03/09/2019  . Exposure to HIV 02/02/2019  . Hypertension   . Genital herpes   . Yeast infection 12/03/2016  . Status post cesarean delivery 06/06/2014  . Uterine fibroid 12/25/2013  . Obesity 12/25/2013  . Screen for STD (sexually transmitted disease) 04/27/2012    Past Surgical History:  Procedure Laterality Date  . CESAREAN SECTION N/A 06/05/2014   Procedure: CESAREAN SECTION;  Surgeon: Truett Mainland, DO;  Location: [facility];  Service: Obstetrics;  Laterality: N/A;  . WISDOM TOOTH EXTRACTION      OB History    Gravida  1   Para  1   Term      Preterm  1   AB      Living  2     SAB      TAB      Ectopic      Multiple  1   Live Births  2            Home Medications    Prior to Admission medications   Medication Sig Start Date  End Date Taking? Authorizing Provider  cyclobenzaprine (FLEXERIL) 10 MG tablet Take 1 tablet (10 mg total) by mouth 2 (two) times daily as needed for muscle spasms. 07/17/19   Chase Picket, MD  fluticasone (FLONASE) 50 MCG/ACT nasal spray Place 2 sprays into both nostrils daily. 05/02/19   Tasia Catchings, Amy V, PA-C  ipratropium (ATROVENT) 0.03 % nasal spray Place 2 sprays into both nostrils every 12 (twelve) hours. 07/17/19   Chase Picket, MD  loratadine (CLARITIN) 10 MG tablet Take 1 tablet (10 mg total) by mouth daily. Patient taking differently: Take 10 mg by mouth daily as needed for allergies.  07/17/19   LampteyMyrene Galas, MD  lubiprostone (AMITIZA) 24 MCG capsule Take 1 capsule (24 mcg total) by mouth 2 (two) times daily with a meal. 04/27/19   Willia Craze, NP  metroNIDAZOLE (METROGEL VAGINAL) 0.75 % vaginal gel Place 1 Applicatorful vaginally at bedtime. 1 applicator full to vagina at night X5 days 08/31/19   Lamptey, Myrene Galas, MD  naproxen (NAPROSYN) 500 MG tablet Take 1 tablet (500 mg total) by mouth 2 (two) times daily. 03/25/19   Alveria Apley, PA-C  nystatin (MYCOSTATIN) 100000 UNIT/ML suspension Take 5 mLs (500,000 Units  total) by mouth 4 (four) times daily. 08/30/19   Jaynee Eagles, PA-C  olopatadine (PATANOL) 0.1 % ophthalmic solution Place 1 drop into both eyes 2 (two) times daily. Patient taking differently: Place 1 drop into both eyes 2 (two) times daily as needed for allergies.  07/17/19   Lamptey, Myrene Galas, MD  ondansetron (ZOFRAN) 4 MG tablet TAKE 1 TABLET (4 MG TOTAL) BY MOUTH EVERY 8 (EIGHT) HOURS AS NEEDED FOR NAUSEA OR VOMITING. 08/10/19   Thereasa Solo, Dionne Bucy, PA-C  ondansetron (ZOFRAN-ODT) 4 MG disintegrating tablet TAKE 1 TABLET (4 MG TOTAL) BY MOUTH EVERY 8 (EIGHT) HOURS AS NEEDED FOR NAUSEA OR VOMITING. 09/08/19   Fulp, Cammie, MD  pantoprazole (PROTONIX) 20 MG tablet Take 1 tablet (20 mg total) by mouth 2 (two) times daily. 08/10/19 09/09/19  Argentina Donovan, PA-C  polyethylene  glycol (MIRALAX) 17 g packet Take 17 g by mouth daily as needed for moderate constipation or severe constipation. 08/30/19   Jaynee Eagles, PA-C    Family History Family History  Problem Relation Age of Onset  . Hypertension Mother   . Hypertension Father   . Breast cancer Maternal Grandmother        50  . Colon cancer Neg Hx   . Colon polyps Neg Hx   . Kidney disease Neg Hx   . Diabetes Neg Hx   . Esophageal cancer Neg Hx   . Gallbladder disease Neg Hx   . Heart disease Neg Hx   . Asthma Neg Hx   . Cancer Neg Hx   . Stroke Neg Hx   . Stomach cancer Neg Hx     Social History Social History   Tobacco Use  . Smoking status: Never Smoker  . Smokeless tobacco: Never Used  Vaping Use  . Vaping Use: Never used  Substance Use Topics  . Alcohol use: No  . Drug use: No     Allergies   Macrobid [nitrofurantoin]   Review of Systems As per HPI   Physical Exam Triage Vital Signs ED Triage Vitals  Enc Vitals Group     BP      Pulse      Resp      Temp      Temp src      SpO2      Weight      Height      Head Circumference      Peak Flow      Pain Score      Pain Loc      Pain Edu?      Excl. in [place]?    No data found.  Updated Vital Signs BP (!) 136/91 (BP Location: Left Arm)   Pulse 89   Temp 98 F (36.7 C) (Oral)   Resp 16   LMP 09/05/2019   SpO2 98%   Visual Acuity Right Eye Distance:   Left Eye Distance:   Bilateral Distance:    Right Eye Near:   Left Eye Near:    Bilateral Near:     Physical Exam Vitals reviewed.  Constitutional:      General: She is not in acute distress.    Appearance: She is normal weight. She is not ill-appearing.  HENT:     Head: Normocephalic and atraumatic.  Eyes:     General: No scleral icterus.       Right eye: No discharge.        Left eye: No discharge.  Extraocular Movements: Extraocular movements intact.     Pupils: Pupils are equal, round, and reactive to light.  Cardiovascular:     Rate and Rhythm:  Normal rate and regular rhythm.     Pulses: Normal pulses.     Heart sounds: No murmur heard.   Pulmonary:     Effort: Pulmonary effort is normal. No respiratory distress.     Breath sounds: No wheezing.  Chest:     Chest wall: No tenderness.  Abdominal:     General: Abdomen is flat.     Palpations: Abdomen is soft.     Tenderness: There is no abdominal tenderness. There is no guarding.  Musculoskeletal:     Cervical back: Normal range of motion and neck supple. No muscular tenderness.  Lymphadenopathy:     Cervical: No cervical adenopathy.  Skin:    General: Skin is warm.     Capillary Refill: Capillary refill takes less than 2 seconds.     Coloration: Skin is not jaundiced or pale.     Findings: No rash.  Neurological:     General: No focal deficit present.     Mental Status: She is alert and oriented to person, place, and time.  Psychiatric:        Mood and Affect: Mood normal.        Thought Content: Thought content normal.      UC Treatments / Results  Labs (all labs ordered are listed, but only abnormal results are displayed) Labs Reviewed  POCT FASTING CBG [facility] MANUAL ENTRY - Abnormal; Notable for the following components:      Result Value   POCT Glucose ([facility]) 118 (*)    All other components within normal limits  POCT URINE PREGNANCY    EKG   Radiology No results found.  Procedures Procedures (including critical care time)  Medications Ordered in UC Medications  ketorolac (TORADOL) 30 MG/ML injection 30 mg (30 mg Intramuscular Given 09/12/19 1623)  dexamethasone (DECADRON) injection 10 mg (10 mg Intramuscular Given 09/12/19 1624)  ondansetron (ZOFRAN-ODT) disintegrating tablet 4 mg (4 mg Oral Given 09/12/19 1624)    Initial Impression / Assessment and Plan / UC Course  I have reviewed the triage vital signs and the nursing notes.  Pertinent labs & imaging results that were available during my care of the patient were reviewed by me and considered in  my medical decision making (see chart for details).     Patient febrile, nontoxic in office today.  No acute distress.  Urine pregnancy negative, CBG 118 (patient drank some herbal tea PTA).  Headache cocktail given as outlined above.  Patient tolerated well.  Will have patient keep track of symptoms, follow-up with PCP for further evaluation/management if needed.  Low concern for acute process at this time given reassuring vitals, exam, history.  Return precautions discussed, patient verbalized understanding and is agreeable to plan. Final Clinical Impressions(s) / UC Diagnoses   Final diagnoses:  Other migraine without status migrainosus, not intractable     Discharge Instructions     You were given medications today for your headache. Important to keep a log of your headaches: When they start, what alleviates them, pain on a scale of 1-10. Bring your headache log to your primary care for further evaluation as you may need to be on medications to help prevent headaches. Go to ER for worse headache of life, loss/change of vision, vomiting, fever, ear ringing, dizziness, weakness, facial droop/slurred speech, severe abdominal pain.  ED Prescriptions    None     PDMP not reviewed this encounter.   Hall-Potvin, [place], [place] 09/12/19 1822

## 2019-09-12 NOTE — ED Notes (Signed)
Patient able to ambulate independently  

## 2019-09-12 NOTE — ED Triage Notes (Signed)
Pt presents to [facility] for assessment of fatigue, nausea, and dizziness x 3 days.  States she waited to come in to try to see if it would resolve.

## 2019-09-12 NOTE — ED Notes (Signed)
Patient states no improvement to any symptoms with medications.

## 2019-09-12 NOTE — Discharge Instructions (Signed)
You were given medications today for your headache. Important to keep a log of your headaches: When they start, what alleviates them, pain on a scale of 1-10. Bring your headache log to your primary care for further evaluation as you may need to be on medications to help prevent headaches. Go to ER for worse headache of life, loss/change of vision, vomiting, fever, ear ringing, dizziness, weakness, facial droop/slurred speech, severe abdominal pain. 

## 2019-09-15 ENCOUNTER — Ambulatory Visit (HOSPITAL_COMMUNITY)
Admission: EM | Admit: 2019-09-15 | Discharge: 2019-09-15 | Disposition: A | Discharge status: Home / Self Care | Payer: Self-pay | Attending: Emergency Medicine | Admitting: Emergency Medicine

## 2019-09-15 ENCOUNTER — Encounter (HOSPITAL_COMMUNITY): Payer: Self-pay | Admitting: Emergency Medicine

## 2019-09-15 ENCOUNTER — Other Ambulatory Visit: Payer: Self-pay

## 2019-09-15 DIAGNOSIS — H9202 Otalgia, left ear: Secondary | ICD-10-CM

## 2019-09-15 MED ORDER — PREDNISONE 10 MG (21) PO TBPK
ORAL_TABLET | Freq: Every day | ORAL | 0 refills | Status: DC
Start: 2019-09-15 — End: 2019-09-26

## 2019-09-15 MED FILL — predniSONE 10 MG TABS: 10 | 6 days supply | Qty: 21 | Fill #0

## 2019-09-15 NOTE — ED Triage Notes (Signed)
Pt here for left ear pain and feeling clogged x 3 days

## 2019-09-15 NOTE — Discharge Instructions (Signed)
Continue with your daily nasal spray.  Please follow up with ENT.  5 days of oral prednisone.

## 2019-09-15 NOTE — ED Provider Notes (Signed)
[facility]    CSN: [id number] Arrival date & time: 09/15/19  1145      History   Chief Complaint Chief Complaint  Patient presents with   Otalgia    HPI Sherri [name] is a 34 y.o. female.   Sherri [name] presents with complaints of left ear/ facial pain for the past three days. No ear drainage. No fevers. Feels fatigued. No sore throat, nasal congestion, cough. Endorses the ear feels "clogged" and feels like she has decreased hearing. She uses daily flonase. Was seen in UC 7/13 and given im decadron and toradol for right sided headache, with minimal relief. Has a history of ear symptoms and sinus symptoms and has been to the UC for this various times in the past. Denies any follow up with ENT.     ROS per HPI, negative if not otherwise mentioned.      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    Urinary tract infection    Uterine fibroid     Patient Active Problem List   Diagnosis Date Noted   Foul smelling vaginal discharge 03/09/2019   Chronic constipation 03/09/2019   Pelvic pain in female 03/09/2019   Exposure to HIV 02/02/2019   Hypertension    Genital herpes    Yeast infection 12/03/2016   Status post cesarean delivery 06/06/2014   Uterine fibroid 12/25/2013   Obesity 12/25/2013   Screen for STD (sexually transmitted disease) 04/27/2012    Past Surgical History:  Procedure Laterality Date   CESAREAN SECTION N/A 06/05/2014   Procedure: CESAREAN SECTION;  Surgeon: Truett Mainland, DO;  Location: [facility];  Service: Obstetrics;  Laterality: N/A;   WISDOM TOOTH EXTRACTION      OB History    Gravida  1   Para  1   Term      Preterm  1   AB      Living  2     SAB      TAB      Ectopic      Multiple  1   Live Births  2            Home Medications    Prior  to Admission medications   Medication Sig Start Date End Date Taking? Authorizing Provider  cyclobenzaprine (FLEXERIL) 10 MG tablet Take 1 tablet (10 mg total) by mouth 2 (two) times daily as needed for muscle spasms. 07/17/19   Chase Picket, MD  fluticasone (FLONASE) 50 MCG/ACT nasal spray Place 2 sprays into both nostrils daily. 05/02/19   Tasia Catchings, Amy V, PA-C  ipratropium (ATROVENT) 0.03 % nasal spray Place 2 sprays into both nostrils every 12 (twelve) hours. 07/17/19   Chase Picket, MD  loratadine (CLARITIN) 10 MG tablet Take 1 tablet (10 mg total) by mouth daily. Patient taking differently: Take 10 mg by mouth daily as needed for allergies.  07/17/19   LampteyMyrene Galas, MD  lubiprostone (AMITIZA) 24 MCG capsule Take 1 capsule (24 mcg total) by mouth 2 (two) times daily with a meal. 04/27/19   Willia Craze, NP  metroNIDAZOLE (METROGEL VAGINAL) 0.75 % vaginal gel Place 1 Applicatorful vaginally at bedtime. 1 applicator full to vagina at night X5 days 08/31/19   Chase Picket, MD  naproxen (NAPROSYN) 500 MG tablet Take  1 tablet (500 mg total) by mouth 2 (two) times daily. 03/25/19   Alveria Apley, PA-C  nystatin (MYCOSTATIN) 100000 UNIT/ML suspension Take 5 mLs (500,000 Units total) by mouth 4 (four) times daily. 08/30/19   Jaynee Eagles, PA-C  olopatadine (PATANOL) 0.1 % ophthalmic solution Place 1 drop into both eyes 2 (two) times daily. Patient taking differently: Place 1 drop into both eyes 2 (two) times daily as needed for allergies.  07/17/19   Lamptey, Myrene Galas, MD  ondansetron (ZOFRAN) 4 MG tablet TAKE 1 TABLET (4 MG TOTAL) BY MOUTH EVERY 8 (EIGHT) HOURS AS NEEDED FOR NAUSEA OR VOMITING. 08/10/19   Thereasa Solo, Dionne Bucy, PA-C  ondansetron (ZOFRAN-ODT) 4 MG disintegrating tablet TAKE 1 TABLET (4 MG TOTAL) BY MOUTH EVERY 8 (EIGHT) HOURS AS NEEDED FOR NAUSEA OR VOMITING. 09/08/19   Fulp, Cammie, MD  pantoprazole (PROTONIX) 20 MG tablet Take 1 tablet (20 mg total) by mouth 2 (two) times daily.  08/10/19 09/09/19  Argentina Donovan, PA-C  polyethylene glycol (MIRALAX) 17 g packet Take 17 g by mouth daily as needed for moderate constipation or severe constipation. 08/30/19   Jaynee Eagles, PA-C  predniSONE (STERAPRED UNI-PAK 21 TAB) 10 MG (21) TBPK tablet Take by mouth daily. Per box instruction 09/15/19   Zigmund Gottron, NP    Family History Family History  Problem Relation Age of Onset   Hypertension Mother    Hypertension Father    Breast cancer Maternal Grandmother        54   Colon cancer Neg Hx    Colon polyps Neg Hx    Kidney disease Neg Hx    Diabetes Neg Hx    Esophageal cancer Neg Hx    Gallbladder disease Neg Hx    Heart disease Neg Hx    Asthma Neg Hx    Cancer Neg Hx    Stroke Neg Hx    Stomach cancer Neg Hx     Social History Social History   Tobacco Use   Smoking status: Never Smoker   Smokeless tobacco: Never Used  Vaping Use   Vaping Use: Never used  Substance Use Topics   Alcohol use: No   Drug use: No     Allergies   Macrobid [nitrofurantoin]   Review of Systems Review of Systems   Physical Exam Triage Vital Signs ED Triage Vitals [09/15/19 1221]  Enc Vitals Group     BP 133/86     Pulse Rate 72     Resp 18     Temp 98.5 F (36.9 C)     Temp Source Oral     SpO2 100 %     Weight      Height      Head Circumference      Peak Flow      Pain Score 8     Pain Loc      Pain Edu?      Excl. in [place]?    No data found.  Updated Vital Signs BP 133/86 (BP Location: Right Arm)    Pulse 72    Temp 98.5 F (36.9 C) (Oral)    Resp 18    LMP 09/05/2019    SpO2 100%    Physical Exam Constitutional:      General: She is not in acute distress.    Appearance: She is well-developed.  HENT:     Head:     Comments: Some tenderness around ear and with external ear movement  without any redness, swelling, warmth fluctuance; no trismus     Right Ear: Tympanic membrane and ear canal normal.     Left Ear: Tympanic membrane  and ear canal normal.     Mouth/Throat:     Mouth: Mucous membranes are moist.     Pharynx: No posterior oropharyngeal erythema.  Eyes:     Pupils: Pupils are equal, round, and reactive to light.  Cardiovascular:     Rate and Rhythm: Normal rate.  Pulmonary:     Effort: Pulmonary effort is normal.  Skin:    General: Skin is warm and dry.  Neurological:     Mental Status: She is alert and oriented to person, place, and time.      UC Treatments / Results  Labs (all labs ordered are listed, but only abnormal results are displayed) Labs Reviewed - No data to display  EKG   Radiology No results found.  Procedures Procedures (including critical care time)  Medications Ordered in UC Medications - No data to display  Initial Impression / Assessment and Plan / UC Course  I have reviewed the triage vital signs and the nursing notes.  Pertinent labs & imaging results that were available during my care of the patient were reviewed by me and considered in my medical decision making (see chart for details).     Acute on chronic otalgia. No indication of acute sinusitis currently. Steroid taper provided, encouraged follow up with ENT. Patient verbalized understanding and agreeable to plan.   Final Clinical Impressions(s) / UC Diagnoses   Final diagnoses:  Left ear pain     Discharge Instructions     Continue with your daily nasal spray.  Please follow up with ENT.  5 days of oral prednisone.    ED Prescriptions    Medication Sig Dispense Auth. Provider   predniSONE (STERAPRED UNI-PAK 21 TAB) 10 MG (21) TBPK tablet Take by mouth daily. Per box instruction 21 tablet Zigmund Gottron, NP     PDMP not reviewed this encounter.   Zigmund Gottron, NP 09/15/19 1310

## 2019-09-21 ENCOUNTER — Other Ambulatory Visit: Payer: Self-pay

## 2019-09-21 ENCOUNTER — Ambulatory Visit (HOSPITAL_COMMUNITY)
Admission: EM | Admit: 2019-09-21 | Discharge: 2019-09-21 | Disposition: A | Discharge status: Home / Self Care | Payer: Self-pay | Attending: Physician Assistant | Admitting: Physician Assistant

## 2019-09-21 ENCOUNTER — Encounter (HOSPITAL_COMMUNITY): Payer: Self-pay

## 2019-09-21 DIAGNOSIS — T50905A Adverse effect of unspecified drugs, medicaments and biological substances, initial encounter: Secondary | ICD-10-CM

## 2019-09-21 DIAGNOSIS — R1084 Generalized abdominal pain: Secondary | ICD-10-CM | POA: Insufficient documentation

## 2019-09-21 DIAGNOSIS — K5900 Constipation, unspecified: Secondary | ICD-10-CM | POA: Insufficient documentation

## 2019-09-21 DIAGNOSIS — Z3202 Encounter for pregnancy test, result negative: Secondary | ICD-10-CM

## 2019-09-21 LAB — POCT URINALYSIS DIP (DEVICE)
Bilirubin Urine: NEGATIVE
Glucose, UA: NEGATIVE mg/dL
Ketones, ur: NEGATIVE mg/dL
Leukocytes,Ua: NEGATIVE
Nitrite: NEGATIVE
Protein, ur: NEGATIVE mg/dL
Specific Gravity, Urine: 1.025 (ref 1.005–1.030)
Urobilinogen, UA: 0.2 mg/dL (ref 0.0–1.0)
pH: 6.5 (ref 5.0–8.0)

## 2019-09-21 LAB — POC URINE PREG, ED: Preg Test, Ur: NEGATIVE

## 2019-09-21 NOTE — Discharge Instructions (Addendum)
I want you to take miralax daily - take 2 scoops today, then daily , you should have regular daily bowel movements  Continue your other medications as prescribed  Schedule follow up with your Primary care next week, you may also request to see your GI doctor sooner as well  If pain becomes worse, you have vomiting, fever, dark tar like stool or bloody stool, go to  the emergency department

## 2019-09-21 NOTE — ED Provider Notes (Signed)
[facility]    CSN: [id number] Arrival date & time: 09/21/19  3491      History   Chief Complaint Chief Complaint  Patient presents with  . Medication Reaction    HPI Sherri [name] is a 34 y.o. female.   Patient with history of constipation and chronic abdominal pains presents for abdominal pain over the last 6 days.  She reports abdominal discomfort started after starting prednisone for upper respiratory symptoms.  She reports the pain has been persistent since that time with some increase.  She describes some sharp right-sided pain but also pain throughout the belly.  Food seems to change this pain to make a little worse, however if she drinks liquids it backs off some.  She reports she had a bowel movement this morning that was brown and formed.  She does have a history of having dark stools with has not had 1 of those in a while.  She reports her baseline nausea but no vomiting.  No fevers or chills.  She reports her baseline painful and frequent urination, no change in this.  No vaginal discharge.  No pelvic pain reported.  She does report some back pain on the right side.  She has had back pain here before.  She reports it hurts to bend and twist in the right side of her back.  Denies any rashes.    Patient reports she has been using MiraLAX as needed and not daily like previously instructed.  She reports prior to today's bowel movement her last bowel movement was on Tuesday which was 2 days ago.  This was formed and not liquidy.  Not hard to pass.  No pain with defecation.  She reports her belly is hurt like this before, however this is just a little more intense.     Past Medical History:  Diagnosis Date  . Fibromyalgia    denies today  . Genital herpes   . Hypertension    gestational  . 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 Urinary tract infection   . Uterine fibroid     Patient Active Problem List   Diagnosis Date Noted  .  Foul smelling vaginal discharge 03/09/2019  . Chronic constipation 03/09/2019  . Pelvic pain in female 03/09/2019  . Exposure to HIV 02/02/2019  . Hypertension   . Genital herpes   . Yeast infection 12/03/2016  . Status post cesarean delivery 06/06/2014  . Uterine fibroid 12/25/2013  . Obesity 12/25/2013  . Screen for STD (sexually transmitted disease) 04/27/2012    Past Surgical History:  Procedure Laterality Date  . CESAREAN SECTION N/A 06/05/2014   Procedure: CESAREAN SECTION;  Surgeon: Truett Mainland, DO;  Location: [facility];  Service: Obstetrics;  Laterality: N/A;  . WISDOM TOOTH EXTRACTION      OB History    Gravida  1   Para  1   Term      Preterm  1   AB      Living  2     SAB      TAB      Ectopic      Multiple  1   Live Births  2            Home Medications    Prior to Admission medications   Medication Sig Start Date End Date Taking? Authorizing Provider  cyclobenzaprine (FLEXERIL) 10 MG tablet Take 1 tablet (10 mg total) by mouth 2 (two) times  daily as needed for muscle spasms. 07/17/19   Chase Picket, MD  fluticasone (FLONASE) 50 MCG/ACT nasal spray Place 2 sprays into both nostrils daily. 05/02/19   Tasia Catchings, Amy V, PA-C  ipratropium (ATROVENT) 0.03 % nasal spray Place 2 sprays into both nostrils every 12 (twelve) hours. 07/17/19   Chase Picket, MD  loratadine (CLARITIN) 10 MG tablet Take 1 tablet (10 mg total) by mouth daily. Patient taking differently: Take 10 mg by mouth daily as needed for allergies.  07/17/19   LampteyMyrene Galas, MD  lubiprostone (AMITIZA) 24 MCG capsule Take 1 capsule (24 mcg total) by mouth 2 (two) times daily with a meal. 04/27/19   Willia Craze, NP  metroNIDAZOLE (METROGEL VAGINAL) 0.75 % vaginal gel Place 1 Applicatorful vaginally at bedtime. 1 applicator full to vagina at night X5 days 08/31/19   Lamptey, Myrene Galas, MD  naproxen (NAPROSYN) 500 MG tablet Take 1 tablet (500 mg total) by mouth 2 (two) times daily.  03/25/19   Alveria Apley, PA-C  nystatin (MYCOSTATIN) 100000 UNIT/ML suspension Take 5 mLs (500,000 Units total) by mouth 4 (four) times daily. 08/30/19   Jaynee Eagles, PA-C  olopatadine (PATANOL) 0.1 % ophthalmic solution Place 1 drop into both eyes 2 (two) times daily. Patient taking differently: Place 1 drop into both eyes 2 (two) times daily as needed for allergies.  07/17/19   Lamptey, Myrene Galas, MD  ondansetron (ZOFRAN) 4 MG tablet TAKE 1 TABLET (4 MG TOTAL) BY MOUTH EVERY 8 (EIGHT) HOURS AS NEEDED FOR NAUSEA OR VOMITING. 08/10/19   Thereasa Solo, Dionne Bucy, PA-C  ondansetron (ZOFRAN-ODT) 4 MG disintegrating tablet TAKE 1 TABLET (4 MG TOTAL) BY MOUTH EVERY 8 (EIGHT) HOURS AS NEEDED FOR NAUSEA OR VOMITING. 09/08/19   Fulp, Cammie, MD  pantoprazole (PROTONIX) 20 MG tablet Take 1 tablet (20 mg total) by mouth 2 (two) times daily. 08/10/19 09/09/19  Argentina Donovan, PA-C  polyethylene glycol (MIRALAX) 17 g packet Take 17 g by mouth daily as needed for moderate constipation or severe constipation. 08/30/19   Jaynee Eagles, PA-C  predniSONE (STERAPRED UNI-PAK 21 TAB) 10 MG (21) TBPK tablet Take by mouth daily. Per box instruction 09/15/19   Zigmund Gottron, NP    Family History Family History  Problem Relation Age of Onset  . Hypertension Mother   . Hypertension Father   . Breast cancer Maternal Grandmother        38  . Colon cancer Neg Hx   . Colon polyps Neg Hx   . Kidney disease Neg Hx   . Diabetes Neg Hx   . Esophageal cancer Neg Hx   . Gallbladder disease Neg Hx   . Heart disease Neg Hx   . Asthma Neg Hx   . Cancer Neg Hx   . Stroke Neg Hx   . Stomach cancer Neg Hx     Social History Social History   Tobacco Use  . Smoking status: Never Smoker  . Smokeless tobacco: Never Used  Vaping Use  . Vaping Use: Never used  Substance Use Topics  . Alcohol use: No  . Drug use: No     Allergies   Macrobid [nitrofurantoin]   Review of Systems Review of Systems   Physical Exam Triage  Vital Signs ED Triage Vitals  Enc Vitals Group     BP 09/21/19 0825 115/83     Pulse Rate 09/21/19 0825 78     Resp 09/21/19 0825 18  Temp 09/21/19 0825 98.8 F (37.1 C)     Temp Source 09/21/19 0825 Oral     SpO2 09/21/19 0825 94 %     Weight --      Height --      Head Circumference --      Peak Flow --      Pain Score 09/21/19 0822 5     Pain Loc --      Pain Edu? --      Excl. in [place]? --    No data found.  Updated Vital Signs BP 115/83 (BP Location: Left Arm)   Pulse 78   Temp 98.8 F (37.1 C) (Oral)   Resp 18   LMP 09/05/2019   SpO2 94%   Visual Acuity Right Eye Distance:   Left Eye Distance:   Bilateral Distance:    Right Eye Near:   Left Eye Near:    Bilateral Near:     Physical Exam Vitals and nursing note reviewed.  Constitutional:      General: She is not in acute distress.    Appearance: She is well-developed. She is not ill-appearing, toxic-appearing or diaphoretic.  HENT:     Head: Normocephalic and atraumatic.  Eyes:     Conjunctiva/sclera: Conjunctivae normal.  Cardiovascular:     Rate and Rhythm: Normal rate and regular rhythm.     Heart sounds: No murmur heard.   Pulmonary:     Effort: Pulmonary effort is normal. No respiratory distress.     Breath sounds: Normal breath sounds.  Abdominal:     General: Bowel sounds are normal.     Palpations: Abdomen is soft.     Tenderness: There is no abdominal tenderness. There is no right CVA tenderness or left CVA tenderness.     Comments: Abdomen is nondistended, soft without masses.  There is generalized abdominal discomfort, there is also inconsistent exam with pain reported in multiple areas with different exams today.  There is no guarding, no true rebound.  Patient is visibly comfortable in the exam room.  No Rovsing sign, obturator, psoas.  No Murphy's.  No pain directly at McBurney's point.  Musculoskeletal:     Cervical back: Neck supple.     Comments: Tenderness to palpation of the  right-sided paraspinal musculature.  Some discomfort with bending and flexing.  No midline tenderness.  Skin:    General: Skin is warm and dry.  Neurological:     Mental Status: She is alert.      UC Treatments / Results  Labs (all labs ordered are listed, but only abnormal results are displayed) Labs Reviewed  POCT URINALYSIS DIP (DEVICE) - Abnormal; Notable for the following components:      Result Value   Hgb urine dipstick TRACE (*)    All other components within normal limits  POC URINE PREG, ED  CERVICOVAGINAL ANCILLARY ONLY    EKG   Radiology No results found.  Procedures Procedures (including critical care time)  Medications Ordered in UC Medications - No data to display  Initial Impression / Assessment and Plan / UC Course  I have reviewed the triage vital signs and the nursing notes.  Pertinent labs & imaging results that were available during my care of the patient were reviewed by me and considered in my medical decision making (see chart for details).     #Generalized abdominal pain Patient is a 34 year old with history of chronic pain syndrome, constipation, chronic abdominal pain presenting with generalized abdominal pain.  Completely normal vital signs here.  Generalized exam, nonfocal without definitive peritoneal signs here, doubt acute abdomen.  Urine without sign of infection.  Culture sent.  Patient declined blood work today, I discussed that we could evaluate her gallbladder, liver and kidney functions as well as her blood counts to check for signs of infection, however she declined.  I discussed with her that given history of constipation and x-ray on 08/30/2019 showing large amounts of stool, likely this is related to constipation.  Instructed her to take 2 scoops of MiraLAX a day and then continue using this daily.  Instructed her that if she were to have increase in pain, fever dark stools or blood in her stool or vomiting that she should report to  the emergency department for further evaluation.  She verbalized understanding of this.  Instructed her to follow-up with her primary care early next week.  She has a scheduled appointment with her GI doctor in August.  Patient verbalized understanding the plan of care. -We also obtained a cervical vaginal swab as she does have a history of bacterial vaginosis.   Final Clinical Impressions(s) / UC Diagnoses   Final diagnoses:  Generalized abdominal pain     Discharge Instructions     I want you to take miralax daily - take 2 scoops today, then daily , you should have regular daily bowel movements  Continue your other medications as prescribed  Schedule follow up with your Primary care next week, you may also request to see your GI doctor sooner as well  If pain becomes worse, you have vomiting, fever, dark tar like stool or bloody stool, go to  the emergency department    ED Prescriptions    None     PDMP not reviewed this encounter.   Purnell Shoemaker, PA-C 09/21/19 1036

## 2019-09-21 NOTE — ED Triage Notes (Signed)
Pt presents with abdominal discomfort from prednisone taper pack prescribed for her sinus and body aches a few days ago; pt states she completed pack even though it caused nausea and abdominal discomfort.

## 2019-09-22 ENCOUNTER — Other Ambulatory Visit: Payer: Self-pay | Admitting: Family Medicine

## 2019-09-22 LAB — CERVICOVAGINAL ANCILLARY ONLY
Bacterial Vaginitis (gardnerella): POSITIVE — AB
Candida Glabrata: NEGATIVE
Candida Vaginitis: NEGATIVE
Chlamydia: NEGATIVE
Comment: NEGATIVE
Comment: NEGATIVE
Comment: NEGATIVE
Comment: NEGATIVE
Comment: NEGATIVE
Comment: NORMAL
Neisseria Gonorrhea: NEGATIVE
Trichomonas: NEGATIVE

## 2019-09-22 MED FILL — ONDANSETRON ODT 4 MG TABLET: 4 | 10 days supply | Qty: 30 | Fill #0

## 2019-09-22 NOTE — Telephone Encounter (Signed)
Requested medication (s) are due for refill today: no  Requested medication (s) are on the active medication list: yes  Last refill:  09/08/2019  Future visit scheduled: no  Notes to clinic:  this refill cannot be delegated    Requested Prescriptions  Pending Prescriptions Disp Refills   ondansetron (ZOFRAN-ODT) 4 MG disintegrating tablet [Pharmacy Med Name: ONDANSETRON ODT 4 MG TABLET 4 Tablet] 30 tablet 0    Sig: TAKE 1 TABLET (4 MG TOTAL) BY MOUTH EVERY 8 (EIGHT) HOURS AS NEEDED FOR NAUSEA OR VOMITING.      Not Delegated - Gastroenterology: Antiemetics Failed - 09/22/2019  9:43 AM      Failed - This refill cannot be delegated      Passed - Valid encounter within last 6 months    Recent Outpatient Visits           1 month ago [facility], [place], [place]   2 months ago Anemia, unspecified type   [place], [place]   5 months ago Chronic constipation   [place], MD   5 months ago [place], [place]   5 months ago Chronic constipation   Craig Ladson, Vernia Buff, NP

## 2019-09-26 ENCOUNTER — Other Ambulatory Visit: Payer: Self-pay | Admitting: Physician Assistant

## 2019-09-26 ENCOUNTER — Other Ambulatory Visit: Payer: Self-pay

## 2019-09-26 ENCOUNTER — Ambulatory Visit: Payer: Self-pay | Admitting: Physician Assistant

## 2019-09-26 VITALS — BP 130/84 | HR 106 | Temp 98.7°F | Resp 18 | Ht 59.0 in | Wt 193.0 lb

## 2019-09-26 DIAGNOSIS — E6609 Other obesity due to excess calories: Secondary | ICD-10-CM

## 2019-09-26 DIAGNOSIS — Z6838 Body mass index (BMI) 38.0-38.9, adult: Secondary | ICD-10-CM

## 2019-09-26 DIAGNOSIS — N760 Acute vaginitis: Secondary | ICD-10-CM

## 2019-09-26 DIAGNOSIS — R030 Elevated blood-pressure reading, without diagnosis of hypertension: Secondary | ICD-10-CM

## 2019-09-26 DIAGNOSIS — R682 Dry mouth, unspecified: Secondary | ICD-10-CM

## 2019-09-26 DIAGNOSIS — B9689 Other specified bacterial agents as the cause of diseases classified elsewhere: Secondary | ICD-10-CM

## 2019-09-26 DIAGNOSIS — R102 Pelvic and perineal pain: Secondary | ICD-10-CM

## 2019-09-26 DIAGNOSIS — G4700 Insomnia, unspecified: Secondary | ICD-10-CM

## 2019-09-26 DIAGNOSIS — R7303 Prediabetes: Secondary | ICD-10-CM

## 2019-09-26 LAB — POCT URINALYSIS DIP (CLINITEK)
Bilirubin, UA: NEGATIVE
Glucose, UA: NEGATIVE mg/dL
Ketones, POC UA: NEGATIVE mg/dL
Leukocytes, UA: NEGATIVE
Nitrite, UA: NEGATIVE
POC PROTEIN,UA: NEGATIVE
Spec Grav, UA: >=1.03 — AB (ref 1.010–1.025)
Urobilinogen, UA: 0.2 E.U./dL
pH, UA: 6 (ref 5.0–8.0)

## 2019-09-26 LAB — POCT GLYCOSYLATED HEMOGLOBIN (HGB A1C): Hemoglobin A1C: 5.6 % (ref 4.0–5.6)

## 2019-09-26 MED ORDER — METRONIDAZOLE 0.75 % VA GEL: 1.0000 | Freq: Every evening | VAGINAL | 0 refills | Status: DC

## 2019-09-26 MED ORDER — METRONIDAZOLE 0.75 % VA GEL: VAGINAL | 2 refills | Status: DC

## 2019-09-26 MED ORDER — HYDROXYZINE HCL 50 MG PO TABS: 50.0000 mg | ORAL_TABLET | Freq: Every evening | ORAL | 0 refills | Status: DC | PRN

## 2019-09-26 MED FILL — hydrOXYzine HCL 50 MG TABS: 50 | 30 days supply | Qty: 30 | Fill #0

## 2019-09-26 MED FILL — metroNIDAZOLE 0.75 % GEL: 0.75 | 5 days supply | Qty: 70 | Fill #0

## 2019-09-26 NOTE — Patient Instructions (Signed)
For your chronic bacterial vaginosis, you will use MetroGel once nightly for 5 nights, then begin twice weekly for 6 months.  I recommend that you start a probiotic over-the-counter.  Your A1c is 5.8 which is considered prediabetes.  I recommend that you increase your water intake, work on reducing sugar and carbohydrates in your every day diet, and improve your sleep quality.  This should improve your dry mouth.  I recommend that you follow a Mediterranean-style diet.  I also recommend that you start taking vitamin D 2000 units over-the-counter once daily.  For your insomnia, I have prescribed hydroxyzine, you can use 1/2-1 full tablet at bedtime.  I recommend that you aim for 7 hours of sleep a night.  Your blood pressure was slightly elevated at this visit, again following a Mediterranean style diet should help reduce your blood pressure.  We are making an appointment for you to be seen by Dr. Juleen China at [facility] at [facility] (10/11) and an appointment for financial assistance.  Please feel free to follow-up in the mobile unit prior to your appointment with Dr. Juleen China.  Please let us know if there is anything else we can do for you  Kennieth Rad, PA-C Physician Assistant [facility]    Preventing Type 2 Diabetes Mellitus Type 2 diabetes (type 2 diabetes mellitus) is a long-term (chronic) disease that affects blood sugar (glucose) levels. Normally, a hormone called insulin allows glucose to enter cells in the body. The cells use glucose for energy. In type 2 diabetes, one or both of these problems may be present:  The body does not make enough insulin.  The body does not respond properly to insulin that it makes (insulin resistance). Insulin resistance or lack of insulin causes excess glucose to build up in the blood instead of going into cells. As a result, high blood glucose (hyperglycemia) develops, which can cause many complications. Being overweight or obese and  having an inactive (sedentary) lifestyle can increase your risk for diabetes. Type 2 diabetes can be delayed or prevented by making certain nutrition and lifestyle changes. What nutrition changes can be made?   Eat healthy meals and snacks regularly. Keep a healthy snack with you for when you get hungry between meals, such as fruit or a handful of nuts.  Eat lean meats and proteins that are low in saturated fats, such as chicken, fish, egg whites, and beans. Avoid processed meats.  Eat plenty of fruits and vegetables and plenty of grains that have not been processed (whole grains). It is recommended that you eat: ? 1?2 cups of fruit every day. ? 2?3 cups of vegetables every day. ? 6?8 oz of whole grains every day, such as oats, whole wheat, bulgur, brown rice, quinoa, and millet.  Eat low-fat dairy products, such as milk, yogurt, and cheese.  Eat foods that contain healthy fats, such as nuts, avocado, olive oil, and canola oil.  Drink water throughout the day. Avoid drinks that contain added sugar, such as soda or sweet tea.  Follow instructions from your health care provider about specific eating or drinking restrictions.  Control how much food you eat at a time (portion size). ? Check food labels to find out the serving sizes of foods. ? Use a kitchen scale to weigh amounts of foods.  Saute or steam food instead of frying it. Cook with water or broth instead of oils or butter.  Limit your intake of: ? Salt (sodium). Have no more than 1  tsp (2,400 mg) of sodium a day. If you have heart disease or high blood pressure, have less than ? tsp (1,500 mg) of sodium a day. ? Saturated fat. This is fat that is solid at room temperature, such as butter or fat on meat. What lifestyle changes can be made? Activity   Do moderate-intensity physical activity for at least 30 minutes on at least 5 days of the week, or as much as told by your health care provider.  Ask your health care  provider what activities are safe for you. A mix of physical activities may be best, such as walking, swimming, cycling, and strength training.  Try to add physical activity into your day. For example: ? Park in spots that are farther away than usual, so that you walk more. For example, park in a far corner of the parking lot when you go to the office or the grocery store. ? Take a walk during your lunch break. ? Use stairs instead of elevators or escalators. Weight Loss  Lose weight as directed. Your health care provider can determine how much weight loss is best for you and can help you lose weight safely.  If you are overweight or obese, you may be instructed to lose at least 5?7 % of your body weight. Alcohol and Tobacco   Limit alcohol intake to no more than 1 drink a day for nonpregnant women and 2 drinks a day for men. One drink equals 12 oz of beer, 5 oz of wine, or 1 oz of hard liquor.  Do not use any tobacco products, such as cigarettes, chewing tobacco, and e-cigarettes. If you need help quitting, ask your health care provider. Work With [place] Provider  Have your blood glucose tested regularly, as told by your health care provider.  Discuss your risk factors and how you can reduce your risk for diabetes.  Get screening tests as told by your health care provider. You may have screening tests regularly, especially if you have certain risk factors for type 2 diabetes.  Make an appointment with a diet and nutrition specialist (registered dietitian). A registered dietitian can help you make a healthy eating plan and can help you understand portion sizes and food labels. Why are these changes important?  It is possible to prevent or delay type 2 diabetes and related health problems by making lifestyle and nutrition changes.  It can be difficult to recognize signs of type 2 diabetes. The best way to avoid possible damage to your body is to take actions to prevent the  disease before you develop symptoms. What can happen if changes are not made?  Your blood glucose levels may keep increasing. Having high blood glucose for a long time is dangerous. Too much glucose in your blood can damage your blood vessels, heart, kidneys, nerves, and eyes.  You may develop prediabetes or type 2 diabetes. Type 2 diabetes can lead to many chronic health problems and complications, such as: ? Heart disease. ? Stroke. ? Blindness. ? Kidney disease. ? Depression. ? Poor circulation in the feet and legs, which could lead to surgical removal (amputation) in severe cases. Where to find support  Ask your health care provider to recommend a registered dietitian, diabetes educator, or weight loss program.  Look for local or online weight loss groups.  Join a gym, fitness club, or outdoor activity group, such as a walking club. Where to find more information To learn more about diabetes and diabetes prevention, visit:  American Diabetes Association (ADA): www.diabetes.[employer] of Diabetes and Digestive and Kidney Diseases: FindSpin.nl To learn more about healthy eating, visit:  The U.S. Department of Agriculture Scientist, research (physical sciences)), Choose My Plate: http://wiley-williams.com/  Office of Disease Prevention and Health Promotion (ODPHP), Dietary Guidelines: SurferLive.at Summary  You can reduce your risk for type 2 diabetes by increasing your physical activity, eating healthy foods, and losing weight as directed.  Talk with your health care provider about your risk for type 2 diabetes. Ask about any blood tests or screening tests that you need to have. This information is not intended to replace advice given to you by your health care provider. Make sure you discuss any questions you have with your health care provider. Document Revised: 06/10/2018 Document Reviewed: 04/09/2015 Elsevier Patient Education  [place].  Preventing Hypertension Hypertension, commonly called high blood pressure, is when the force of blood pumping through the arteries is too strong. Arteries are blood vessels that carry blood from the heart throughout the body. Over time, hypertension can damage the arteries and decrease blood flow to important parts of the body, including the brain, heart, and kidneys. Often, hypertension does not cause symptoms until blood pressure is very high. For this reason, it is important to have your blood pressure checked on a regular basis. Hypertension can often be prevented with diet and lifestyle changes. If you already have hypertension, you can control it with diet and lifestyle changes, as well as medicine. What nutrition changes can be made? Maintain a healthy diet. This includes:  Eating less salt (sodium). Ask your health care provider how much sodium is safe for you to have. The general recommendation is to consume less than 1 tsp (2,300 mg) of sodium a day. ? Do not add salt to your food. ? Choose low-sodium options when grocery shopping and eating out.  Limiting fats in your diet. You can do this by eating low-fat or fat-free dairy products and by eating less red meat.  Eating more fruits, vegetables, and whole grains. Make a goal to eat: ? 1-2 cups of fresh fruits and vegetables each day. ? 3-4 servings of whole grains each day.  Avoiding foods and beverages that have added sugars.  Eating fish that contain healthy fats (omega-3 fatty acids), such as mackerel or salmon. If you need help putting together a healthy eating plan, try the DASH diet. This diet is high in fruits, vegetables, and whole grains. It is low in sodium, red meat, and added sugars. DASH stands for Dietary Approaches to Stop Hypertension. What lifestyle changes can be made?   Lose weight if you are overweight. Losing just 3?5% of your body weight can help prevent or control hypertension. ? For example,  if your present weight is 200 lb (91 kg), a loss of 3-5% of your weight means losing 6-10 lb (2.7-4.5 kg). ? Ask your health care provider to help you with a diet and exercise plan to safely lose weight.  Get enough exercise. Do at least 150 minutes of moderate-intensity exercise each week. ? You could do this in short exercise sessions several times a day, or you could do longer exercise sessions a few times a week. For example, you could take a brisk 10-minute walk or bike ride, 3 times a day, for 5 days a week.  Find ways to reduce stress, such as exercising, meditating, listening to music, or taking a yoga class. If you need help reducing stress, ask your health care provider.  Do not smoke. This includes e-cigarettes. Chemicals in tobacco and nicotine products raise your blood pressure each time you smoke. If you need help quitting, ask your health care provider.  Avoid alcohol. If you drink alcohol, limit alcohol intake to no more than 1 drink a day for nonpregnant women and 2 drinks a day for men. One drink equals 12 oz of beer, 5 oz of wine, or 1 oz of hard liquor. Why are these changes important? Diet and lifestyle changes can help you prevent hypertension, and they may make you feel better overall and improve your quality of life. If you have hypertension, making these changes will help you control it and help prevent major complications, such as:  Hardening and narrowing of arteries that supply blood to: ? Your heart. This can cause a heart attack. ? Your brain. This can cause a stroke. ? Your kidneys. This can cause kidney failure.  Stress on your heart muscle, which can cause heart failure. What can I do to lower my risk?  Work with your health care provider to make a hypertension prevention plan that works for you. Follow your plan and keep all follow-up visits as told by your health care provider.  Learn how to check your blood pressure at home. Make sure that you know your  personal target blood pressure, as told by your health care provider. How is this treated? In addition to diet and lifestyle changes, your health care provider may recommend medicines to help lower your blood pressure. You may need to try a few different medicines to find what works best for you. You also may need to take more than one medicine. Take over-the-counter and prescription medicines only as told by your health care provider. Where to find support Your health care provider can help you prevent hypertension and help you keep your blood pressure at a healthy level. Your local hospital or your community may also provide support services and prevention programs. The American Heart Association offers an online support network at: CheapBootlegs.com.cy Where to find more information Learn more about hypertension from:  [place], Lung, and Blood Institute: ElectronicHangman.is  Centers for Disease Control and Prevention: https://ingram.com/  American Academy of Family Physicians: http://familydoctor.org/familydoctor/en/diseases-conditions/high-blood-pressure.printerview.all.html Learn more about the DASH diet from:  [place], Lung, and [place]: https://www.reyes.com/ Contact a health care provider if:  You think you are having a reaction to medicines you have taken.  You have recurrent headaches or feel dizzy.  You have swelling in your ankles.  You have trouble with your vision. Summary  Hypertension often does not cause any symptoms until blood pressure is very high. It is important to get your blood pressure checked regularly.  Diet and lifestyle changes are the most important steps in preventing hypertension.  By keeping your blood pressure in a healthy range, you can prevent complications like heart attack, heart failure, stroke, and kidney failure.  Work with  your health care provider to make a hypertension prevention plan that works for you. This information is not intended to replace advice given to you by your health care provider. Make sure you discuss any questions you have with your health care provider. Document Revised: 06/10/2018 Document Reviewed: 10/28/2015 Elsevier Patient Education  [place].  Bacterial Vaginosis  Bacterial vaginosis is a vaginal infection that occurs when the normal balance of bacteria in the vagina is disrupted. It results from an overgrowth of certain bacteria. This is the most common vaginal infection among women ages 76-44.  Because bacterial vaginosis increases your risk for STIs (sexually transmitted infections), getting treated can help reduce your risk for chlamydia, gonorrhea, herpes, and HIV (human immunodeficiency virus). Treatment is also important for preventing complications in pregnant women, because this condition can cause an early (premature) delivery. What are the causes? This condition is caused by an increase in harmful bacteria that are normally present in small amounts in the vagina. However, the reason that the condition develops is not fully understood. What increases the risk? The following factors may make you more likely to develop this condition:  Having a new sexual partner or multiple sexual partners.  Having unprotected sex.  Douching.  Having an intrauterine device (IUD).  Smoking.  Drug and alcohol abuse.  Taking certain antibiotic medicines.  Being pregnant. You cannot get bacterial vaginosis from toilet seats, bedding, swimming pools, or contact with objects around you. What are the signs or symptoms? Symptoms of this condition include:  Grey or white vaginal discharge. The discharge can also be watery or foamy.  A fish-like odor with discharge, especially after sexual intercourse or during menstruation.  Itching in and around the vagina.  Burning or pain  with urination. Some women with bacterial vaginosis have no signs or symptoms. How is this diagnosed? This condition is diagnosed based on:  Your medical history.  A physical exam of the vagina.  Testing a sample of vaginal fluid under a microscope to look for a large amount of bad bacteria or abnormal cells. Your health care provider may use a cotton swab or a small wooden spatula to collect the sample. How is this treated? This condition is treated with antibiotics. These may be given as a pill, a vaginal cream, or a medicine that is put into the vagina (suppository). If the condition comes back after treatment, a second round of antibiotics may be needed. Follow these instructions at home: Medicines  Take over-the-counter and prescription medicines only as told by your health care provider.  Take or use your antibiotic as told by your health care provider. Do not stop taking or using the antibiotic even if you start to feel better. General instructions  If you have a female sexual partner, tell her that you have a vaginal infection. She should see her health care provider and be treated if she has symptoms. If you have a female sexual partner, he does not need treatment.  During treatment: ? Avoid sexual activity until you finish treatment. ? Do not douche. ? Avoid alcohol as directed by your health care provider. ? Avoid breastfeeding as directed by your health care provider.  Drink enough water and fluids to keep your urine clear or pale yellow.  Keep the area around your vagina and rectum clean. ? Wash the area daily with warm water. ? Wipe yourself from front to back after using the toilet.  Keep all follow-up visits as told by your health care provider. This is important. How is this prevented?  Do not douche.  Wash the outside of your vagina with warm water only.  Use protection when having sex. This includes latex condoms and dental dams.  Limit how many sexual  partners you have. To help prevent bacterial vaginosis, it is best to have sex with just one partner (monogamous).  Make sure you and your sexual partner are tested for STIs.  Wear cotton or cotton-lined underwear.  Avoid wearing tight pants and pantyhose, especially during summer.  Limit the amount of alcohol that you drink.  Do  not use any products that contain nicotine or tobacco, such as cigarettes and e-cigarettes. If you need help quitting, ask your health care provider.  Do not use illegal drugs. Where to find more information  Centers for Disease Control and Prevention: AppraiserFraud.fi  American Sexual Health Association (ASHA): www.ashastd.org  U.S. Department of Health and Financial controller, Office on Women's Health: DustingSprays.pl or SecuritiesCard.it Contact a health care provider if:  Your symptoms do not improve, even after treatment.  You have more discharge or pain when urinating.  You have a fever.  You have pain in your abdomen.  You have pain during sex.  You have vaginal bleeding between periods. Summary  Bacterial vaginosis is a vaginal infection that occurs when the normal balance of bacteria in the vagina is disrupted.  Because bacterial vaginosis increases your risk for STIs (sexually transmitted infections), getting treated can help reduce your risk for chlamydia, gonorrhea, herpes, and HIV (human immunodeficiency virus). Treatment is also important for preventing complications in pregnant women, because the condition can cause an early (premature) delivery.  This condition is treated with antibiotic medicines. These may be given as a pill, a vaginal cream, or a medicine that is put into the vagina (suppository). This information is not intended to replace advice given to you by your health care provider. Make sure you discuss any questions you have with your health care provider. Document Revised:  01/29/2017 Document Reviewed: 11/02/2015 Elsevier Patient Education  2020 [place] refers to food and lifestyle choices that are based on the traditions of countries located on the [employer]. This way of eating has been shown to help prevent certain conditions and improve outcomes for people who have chronic diseases, like kidney disease and heart disease. What are tips for following this plan? Lifestyle  Cook and eat meals together with your family, when possible.  Drink enough fluid to keep your urine clear or pale yellow.  Be physically active every day. This includes: ? Aerobic exercise like running or swimming. ? Leisure activities like gardening, walking, or housework.  Get 7-8 hours of sleep each night.  If recommended by your health care provider, drink red wine in moderation. This means 1 glass a day for nonpregnant women and 2 glasses a day for men. A glass of wine equals 5 oz (150 mL). Reading food labels   Check the serving size of packaged foods. For foods such as rice and pasta, the serving size refers to the amount of cooked product, not dry.  Check the total fat in packaged foods. Avoid foods that have saturated fat or trans fats.  Check the ingredients list for added sugars, such as corn syrup. Shopping  At the grocery store, buy most of your food from the areas near the walls of the store. This includes: ? Fresh fruits and vegetables (produce). ? Grains, beans, nuts, and seeds. Some of these may be available in unpackaged forms or large amounts (in bulk). ? Fresh seafood. ? Poultry and eggs. ? Low-fat dairy products.  Buy whole ingredients instead of prepackaged foods.  Buy fresh fruits and vegetables in-season from local farmers markets.  Buy frozen fruits and vegetables in resealable bags.  If you do not have access to quality fresh seafood, buy precooked frozen shrimp or canned fish, such as tuna,  salmon, or sardines.  Buy small amounts of raw or cooked vegetables, salads, or olives from the deli or salad bar at your store.  Stock  your pantry so you always have certain foods on hand, such as olive oil, canned tuna, canned tomatoes, rice, pasta, and beans. Cooking  Cook foods with extra-virgin olive oil instead of using butter or other vegetable oils.  Have meat as a side dish, and have vegetables or grains as your main dish. This means having meat in small portions or adding small amounts of meat to foods like pasta or stew.  Use beans or vegetables instead of meat in common dishes like chili or lasagna.  Experiment with different cooking methods. Try roasting or broiling vegetables instead of steaming or sauteing them.  Add frozen vegetables to soups, stews, pasta, or rice.  Add nuts or seeds for added healthy fat at each meal. You can add these to yogurt, salads, or vegetable dishes.  Marinate fish or vegetables using olive oil, lemon juice, garlic, and fresh herbs. Meal planning   Plan to eat 1 vegetarian meal one day each week. Try to work up to 2 vegetarian meals, if possible.  Eat seafood 2 or more times a week.  Have healthy snacks readily available, such as: ? Vegetable sticks with hummus. ? [place] yogurt. ? Fruit and nut trail mix.  Eat balanced meals throughout the week. This includes: ? Fruit: 2-3 servings a day ? Vegetables: 4-5 servings a day ? Low-fat dairy: 2 servings a day ? Fish, poultry, or lean meat: 1 serving a day ? Beans and legumes: 2 or more servings a week ? Nuts and seeds: 1-2 servings a day ? Whole grains: 6-8 servings a day ? Extra-virgin olive oil: 3-4 servings a day  Limit red meat and sweets to only a few servings a month What are my food choices?  Mediterranean diet ? Recommended  Grains: Whole-grain pasta. Brown rice. Bulgar wheat. Polenta. Couscous. Whole-wheat bread. [place].  Vegetables: Artichokes. Beets. Broccoli.  Cabbage. Carrots. Eggplant. Green beans. Chard. Kale. Spinach. Onions. Leeks. Peas. Squash. Tomatoes. Peppers. Radishes.  Fruits: Apples. Apricots. Avocado. Berries. Bananas. Cherries. Dates. Figs. Grapes. Lemons. Melon. Oranges. Peaches. Plums. Pomegranate.  Meats and other protein foods: Beans. Almonds. Sunflower seeds. Pine nuts. Peanuts. [place]. Salmon. Scallops. Shrimp. [place]. Tilapia. Clams. Oysters. Eggs.  Dairy: Low-fat milk. Cheese. Greek yogurt.  Beverages: Water. Red wine. Herbal tea.  Fats and oils: Extra virgin olive oil. Avocado oil. Grape seed oil.  Sweets and desserts: [place] yogurt with honey. Baked apples. Poached pears. Trail mix.  Seasoning and other foods: Basil. Cilantro. Coriander. Cumin. Mint. Parsley. Sage. Rosemary. Tarragon. Garlic. Oregano. Thyme. Pepper. Balsalmic vinegar. Tahini. Hummus. Tomato sauce. Olives. Mushrooms. ? Limit these  Grains: Prepackaged pasta or rice dishes. Prepackaged cereal with added sugar.  Vegetables: Deep fried potatoes (french fries).  Fruits: Fruit canned in syrup.  Meats and other protein foods: Beef. Pork. Lamb. Poultry with skin. Hot dogs. Berniece Salines.  Dairy: Ice cream. Sour cream. Whole milk.  Beverages: Juice. Sugar-sweetened soft drinks. Beer. Liquor and spirits.  Fats and oils: Butter. Canola oil. Vegetable oil. Beef fat (tallow). Lard.  Sweets and desserts: Cookies. Cakes. Pies. Candy.  Seasoning and other foods: Mayonnaise. Premade sauces and marinades. The items listed may not be a complete list. Talk with your dietitian about what dietary choices are right for you. Summary  The Mediterranean diet includes both food and lifestyle choices.  Eat a variety of fresh fruits and vegetables, beans, nuts, seeds, and whole grains.  Limit the amount of red meat and sweets that you eat.  Talk with your health care provider about  whether it is safe for you to drink red wine in moderation. This means 1 glass a day for nonpregnant  women and 2 glasses a day for men. A glass of wine equals 5 oz (150 mL). This information is not intended to replace advice given to you by your health care provider. Make sure you discuss any questions you have with your health care provider. Document Revised: 10/17/2015 Document Reviewed: 10/10/2015 Elsevier Patient Education  [place].  [email or web address]

## 2019-09-26 NOTE — Progress Notes (Signed)
Established Patient Office Visit  Subjective:  Patient ID: Sherri [name], female    DOB: 08-31-1985  Age: 34 y.o. MRN: [id number]  CC:  Chief Complaint  Patient presents with  . Fatigue    HPI [name] reports that she has been having increased fatigue, abdominal pressure, mouth dryness.  Reports that she has been having difficulty sleeping, difficulty falling asleep and staying asleep, is only sleeping a couple hours a night.  Reports this has been ongoing for the last couple of weeks.  Reports that she has tried melatonin without relief.  Reports that she continues to have abdominal pressure.  Reports significant history of GI issues.  Reports that she was seen in the emergency department on July 22 with the same complaint, was encouraged to use MiraLAX to help with constipation.  Reports that she has been having vaginal discharge, thick white with foul odor.  Denies dysuria, fever, chills.  Reports that she has been having increased mouth dryness for the last 3 days, states it is to the point where it is very sore.  Previously was given nystatin mouth rinse without relief, has tried over-the-counter rinses without relief.  Denies any new medications.    Past Medical History:  Diagnosis Date  . Fibromyalgia    denies today  . Genital herpes   . Hypertension    gestational  . 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 Urinary tract infection   . Uterine fibroid     Past Surgical History:  Procedure Laterality Date  . CESAREAN SECTION N/A 06/05/2014   Procedure: CESAREAN SECTION;  Surgeon: Truett Mainland, DO;  Location: [facility];  Service: Obstetrics;  Laterality: N/A;  . WISDOM TOOTH EXTRACTION      Family History  Problem Relation Age of Onset  . Hypertension Mother   . Hypertension Father   . Breast cancer Maternal Grandmother        57  . Colon cancer Neg Hx   . Colon polyps Neg Hx   . Kidney disease Neg Hx   . Diabetes Neg Hx   .  Esophageal cancer Neg Hx   . Gallbladder disease Neg Hx   . Heart disease Neg Hx   . Asthma Neg Hx   . Cancer Neg Hx   . Stroke Neg Hx   . Stomach cancer Neg Hx     Social History   Socioeconomic History  . Marital status: Single    Spouse name: Not on file  . Number of children: Not on file  . Years of education: Not on file  . Highest education level: Not on file  Occupational History  . Occupation: [employer]  Tobacco Use  . Smoking status: Never Smoker  . Smokeless tobacco: Never Used  Vaping Use  . Vaping Use: Never used  Substance and Sexual Activity  . Alcohol use: No  . Drug use: No  . Sexual activity: Yes    Birth control/protection: None  Other Topics Concern  . Not on file  Social History Narrative  . Not on file   Social Determinants of Health   Financial Resource Strain:   . Difficulty of Paying Living Expenses:   Food Insecurity:   . Worried About Charity fundraiser in the Last Year:   . Arboriculturist in the Last Year:   Transportation Needs:   . Film/video editor (Medical):   Marland Kitchen Lack of Transportation (Non-Medical):   Physical  Activity:   . Days of Exercise per Week:   . Minutes of Exercise per Session:   Stress:   . Feeling of Stress :   Social Connections:   . Frequency of Communication with Friends and Family:   . Frequency of Social Gatherings with Friends and Family:   . Attends Religious Services:   . Active Member of Clubs or Organizations:   . Attends Archivist Meetings:   Marland Kitchen Marital Status:   Intimate Partner Violence:   . Fear of Current or Ex-Partner:   . Emotionally Abused:   Marland Kitchen Physically Abused:   . Sexually Abused:     Outpatient Medications Prior to Visit  Medication Sig Dispense Refill  . cyclobenzaprine (FLEXERIL) 10 MG tablet Take 1 tablet (10 mg total) by mouth 2 (two) times daily as needed for muscle spasms. 20 tablet 0  . fluticasone (FLONASE) 50 MCG/ACT nasal spray Place 2 sprays into both nostrils  daily. 1 g 0  . ipratropium (ATROVENT) 0.03 % nasal spray Place 2 sprays into both nostrils every 12 (twelve) hours. 30 mL 12  . loratadine (CLARITIN) 10 MG tablet Take 1 tablet (10 mg total) by mouth daily. (Patient taking differently: Take 10 mg by mouth daily as needed for allergies. )    . lubiprostone (AMITIZA) 24 MCG capsule Take 1 capsule (24 mcg total) by mouth 2 (two) times daily with a meal. 60 capsule 3  . naproxen (NAPROSYN) 500 MG tablet Take 1 tablet (500 mg total) by mouth 2 (two) times daily. 30 tablet 0  . nystatin (MYCOSTATIN) 100000 UNIT/ML suspension Take 5 mLs (500,000 Units total) by mouth 4 (four) times daily. 200 mL 0  . olopatadine (PATANOL) 0.1 % ophthalmic solution Place 1 drop into both eyes 2 (two) times daily. (Patient taking differently: Place 1 drop into both eyes 2 (two) times daily as needed for allergies. ) 5 mL 12  . ondansetron (ZOFRAN) 4 MG tablet TAKE 1 TABLET (4 MG TOTAL) BY MOUTH EVERY 8 (EIGHT) HOURS AS NEEDED FOR NAUSEA OR VOMITING. 30 tablet 0  . ondansetron (ZOFRAN-ODT) 4 MG disintegrating tablet TAKE 1 TABLET (4 MG TOTAL) BY MOUTH EVERY 8 (EIGHT) HOURS AS NEEDED FOR NAUSEA OR VOMITING. 30 tablet 0  . pantoprazole (PROTONIX) 20 MG tablet Take 1 tablet (20 mg total) by mouth 2 (two) times daily. 60 tablet 1  . polyethylene glycol (MIRALAX) 17 g packet Take 17 g by mouth daily as needed for moderate constipation or severe constipation. 10 each 0  . metroNIDAZOLE (METROGEL VAGINAL) 0.75 % vaginal gel Place 1 Applicatorful vaginally at bedtime. 1 applicator full to vagina at night X5 days 70 g 0  . predniSONE (STERAPRED UNI-PAK 21 TAB) 10 MG (21) TBPK tablet Take by mouth daily. Per box instruction 21 tablet 0   No facility-administered medications prior to visit.    Allergies  Allergen Reactions  . Macrobid [Nitrofurantoin] Nausea And Vomiting    ROS Review of Systems  Constitutional: Positive for fatigue. Negative for chills and fever.  HENT:  Negative.   Eyes: Negative.   Respiratory: Negative.   Cardiovascular: Negative.   Gastrointestinal: Positive for abdominal pain. Negative for diarrhea, nausea and vomiting.  Endocrine: Negative.   Genitourinary: Positive for vaginal discharge. Negative for dysuria, genital sores and hematuria.  Musculoskeletal: Negative.   Skin: Negative.   Allergic/Immunologic: Negative.   Neurological: Negative.   Hematological: Negative.   Psychiatric/Behavioral: Positive for sleep disturbance. Negative for self-injury and suicidal ideas.  Objective:    Physical Exam Vitals and nursing note reviewed.  Constitutional:      General: She is not in acute distress.    Appearance: Normal appearance. She is obese. She is not ill-appearing.  HENT:     Head: Normocephalic and atraumatic.     Right Ear: External ear normal.     Left Ear: External ear normal.     Nose: Nose normal.     Mouth/Throat:     Mouth: Mucous membranes are moist.     Pharynx: Oropharynx is clear.  Eyes:     Conjunctiva/sclera: Conjunctivae normal.     Pupils: Pupils are equal, round, and reactive to light.  Cardiovascular:     Rate and Rhythm: Normal rate and regular rhythm.     Pulses: Normal pulses.     Heart sounds: Normal heart sounds.  Pulmonary:     Effort: Pulmonary effort is normal.     Breath sounds: Normal breath sounds.  Abdominal:     General: Bowel sounds are normal.     Palpations: Abdomen is soft.     Tenderness: There is abdominal tenderness in the suprapubic area. There is no right CVA tenderness or left CVA tenderness.  Musculoskeletal:        General: Normal range of motion.     Cervical back: Normal range of motion and neck supple.  Skin:    General: Skin is warm and dry.  Neurological:     General: No focal deficit present.     Mental Status: She is alert and oriented to person, place, and time.  Psychiatric:        Mood and Affect: Mood normal.        Behavior: Behavior normal.         Thought Content: Thought content normal.        Judgment: Judgment normal.     BP (!) 130/84 (BP Location: Left Arm, Patient Position: Sitting, Cuff Size: Large)   Pulse (!) 106   Temp 98.7 F (37.1 C) (Oral)   Resp 18   Ht 4\' 11"  (1.499 m)   Wt 193 lb (87.5 kg)   LMP 09/05/2019   SpO2 100%   BMI 38.98 kg/m  Wt Readings from Last 3 Encounters:  09/26/19 193 lb (87.5 kg)  08/02/19 196 lb (88.9 kg)  07/17/19 190 lb (86.2 kg)     Health Maintenance Due  Topic Date Due  . COVID-19 Vaccine (1) Never done  . PAP SMEAR-Modifier  07/17/2017    There are no preventive care reminders to display for this patient.  Lab Results  Component Value Date   TSH 0.926 04/06/2019   Lab Results  Component Value Date   WBC 8.3 07/14/2019   HGB 11.7 07/14/2019   HCT 36.0 07/14/2019   MCV 81 07/14/2019   PLT 334 07/14/2019   Lab Results  Component Value Date   NA 139 08/10/2019   K 4.0 08/10/2019   CO2 21 08/10/2019   GLUCOSE 89 08/10/2019   BUN 9 08/10/2019   CREATININE 0.83 08/10/2019   BILITOT 0.2 08/10/2019   ALKPHOS 97 08/10/2019   AST 18 08/10/2019   ALT 15 08/10/2019   PROT 7.2 08/10/2019   ALBUMIN 4.3 08/10/2019   CALCIUM 9.4 08/10/2019   ANIONGAP 8 06/21/2019   Lab Results  Component Value Date   CHOL 198 01/07/2017   Lab Results  Component Value Date   HDL 56 01/07/2017   Lab Results  Component  Value Date   LDLCALC 128 (H) 01/07/2017   Lab Results  Component Value Date   TRIG 57 01/07/2017   Lab Results  Component Value Date   CHOLHDL 3.5 01/07/2017   Lab Results  Component Value Date   HGBA1C 5.6 09/26/2019      Assessment & Plan:   Problem List Items Addressed This Visit      Other   Obesity   Relevant Orders   HgB A1c (Completed)   Pelvic pain in female - Primary   Relevant Orders   POCT URINALYSIS DIP (CLINITEK) (Completed)    Other Visit Diagnoses    Bacterial vaginal infection       Relevant Medications   metroNIDAZOLE  (METROGEL VAGINAL) 0.75 % vaginal gel   metroNIDAZOLE (METROGEL VAGINAL) 0.75 % vaginal gel   Insomnia, unspecified type       Relevant Medications   hydrOXYzine (ATARAX/VISTARIL) 50 MG tablet   Pre-diabetes       Relevant Orders   HgB A1c (Completed)   Dry mouth       Elevated blood pressure reading without diagnosis of hypertension          Meds ordered this encounter  Medications  . metroNIDAZOLE (METROGEL VAGINAL) 0.75 % vaginal gel    Sig: Place 1 Applicatorful vaginally at bedtime. 1 applicator full to vagina at night X5 days    Dispense:  70 g    Refill:  0    Order Specific Question:   Supervising Provider    Answer:   Asencion Noble E [[id number]]  . hydrOXYzine (ATARAX/VISTARIL) 50 MG tablet    Sig: Take 1 tablet (50 mg total) by mouth at bedtime as needed.    Dispense:  30 tablet    Refill:  0    Order Specific Question:   Supervising Provider    Answer:   Joya Gaskins, PATRICK E [[id number]]  . metroNIDAZOLE (METROGEL VAGINAL) 0.75 % vaginal gel    Sig: Place 1 applicatorful vaginally at bedtime twice weekly for 6 months    Dispense:  70 g    Refill:  2    Begin after 5 day treatment    Order Specific Question:   Supervising Provider    Answer:   Elsie Stain [[id number]]  1. Pelvic pain in female UA within normal limits, patient did have swab completed at emergency department on 7/22 that did show positive for bacterial vaginosis.  Reports that she was not treated for this - POCT URINALYSIS DIP (CLINITEK)  2. Bacterial vaginal infection Patient reports that she has had bacterial vaginosis 7-8 times within the last 12 months, states that she will use MetroGel with relief, and then infection return shortly after.  Unable to tolerate oral metronidazole.  Encouraged over-the-counter probiotic, patient education given on recurrent bacterial vaginosis - metroNIDAZOLE (METROGEL VAGINAL) 0.75 % vaginal gel; Place 1 Applicatorful vaginally at bedtime. 1 applicator full to vagina at night  X5 days  Dispense: 70 g; Refill: 0 - metroNIDAZOLE (METROGEL VAGINAL) 0.75 % vaginal gel; Place 1 applicatorful vaginally at bedtime twice weekly for 6 months  Dispense: 70 g; Refill: 2  3. Insomnia, unspecified type Patient education given on good sleep hygiene - hydrOXYzine (ATARAX/VISTARIL) 50 MG tablet; Take 1 tablet (50 mg total) by mouth at bedtime as needed.  Dispense: 30 tablet; Refill: 0  4. Pre-diabetes Patient education given on increasing water intake, reducing carbohydrates, improving sleep quality, patient encouraged to take vitamin D 2000 units over-the-counter once daily. -  HgB A1c  5. Dry mouth Encourage patient to work on reducing daily blood sugars, improve sleep   6. Elevated blood pressure reading without diagnosis of hypertension Patient education given on ways to reduce blood pressure, encouraged to check blood pressure at home, keep written log  7. Class 2 obesity due to excess calories with body mass index (BMI) of 38.0 to 38.9 in adult, unspecified whether serious comorbidity present  - HgB A1c   Patient was given appointment for financial assistance and appointment to establish care at [place] [facility]   I have reviewed the patient's medical history (PMH, PSH, Social History, Family History, Medications, and allergies) , and have been updated if relevant. I spent 30 minutes reviewing chart and  face to face time with patient.     Follow-up: Return in about 2 months (around 12/11/2019) for To establish PCP, with Dr. Juleen China at [place].    Loraine Grip Mayers, PA-C

## 2019-09-28 ENCOUNTER — Other Ambulatory Visit: Payer: Self-pay | Admitting: Physician Assistant

## 2019-09-28 DIAGNOSIS — R110 Nausea: Secondary | ICD-10-CM

## 2019-09-28 MED FILL — PANTOPRAZOLE SOD DR 20 MG T: 20 | 30 days supply | Qty: 60 | Fill #0

## 2019-10-06 ENCOUNTER — Encounter: Payer: Self-pay | Admitting: Gastroenterology

## 2019-10-06 ENCOUNTER — Other Ambulatory Visit: Payer: Self-pay

## 2019-10-06 ENCOUNTER — Other Ambulatory Visit (INDEPENDENT_AMBULATORY_CARE_PROVIDER_SITE_OTHER): Payer: Self-pay

## 2019-10-06 ENCOUNTER — Ambulatory Visit: Payer: Self-pay

## 2019-10-06 ENCOUNTER — Ambulatory Visit (INDEPENDENT_AMBULATORY_CARE_PROVIDER_SITE_OTHER): Payer: Self-pay | Admitting: Gastroenterology

## 2019-10-06 VITALS — BP 120/86 | HR 80 | Ht 59.0 in | Wt 195.1 lb

## 2019-10-06 DIAGNOSIS — R1084 Generalized abdominal pain: Secondary | ICD-10-CM

## 2019-10-06 DIAGNOSIS — K5900 Constipation, unspecified: Secondary | ICD-10-CM

## 2019-10-06 DIAGNOSIS — D649 Anemia, unspecified: Secondary | ICD-10-CM

## 2019-10-06 DIAGNOSIS — R140 Abdominal distension (gaseous): Secondary | ICD-10-CM

## 2019-10-06 LAB — CBC WITH DIFFERENTIAL/PLATELET
Basophils Absolute: 0.1 10*3/uL (ref 0.0–0.1)
Basophils Relative: 0.8 % (ref 0.0–3.0)
Eosinophils Absolute: 0.1 10*3/uL (ref 0.0–0.7)
Eosinophils Relative: 2 % (ref 0.0–5.0)
HCT: 33 % — ABNORMAL LOW (ref 36.0–46.0)
Hemoglobin: 10.7 g/dL — ABNORMAL LOW (ref 12.0–15.0)
Lymphocytes Relative: 31.8 % (ref 12.0–46.0)
Lymphs Abs: 2.4 10*3/uL (ref 0.7–4.0)
MCHC: 32.4 g/dL (ref 30.0–36.0)
MCV: 79.1 fl (ref 78.0–100.0)
Monocytes Absolute: 0.6 10*3/uL (ref 0.1–1.0)
Monocytes Relative: 7.4 % (ref 3.0–12.0)
Neutro Abs: 4.4 10*3/uL (ref 1.4–7.7)
Neutrophils Relative %: 58 % (ref 43.0–77.0)
Platelets: 312 10*3/uL (ref 150.0–400.0)
RBC: 4.17 Mil/uL (ref 3.87–5.11)
RDW: 18.2 % — ABNORMAL HIGH (ref 11.5–15.5)
WBC: 7.5 10*3/uL (ref 4.0–10.5)

## 2019-10-06 LAB — VITAMIN B12: Vitamin B-12: 535 pg/mL (ref 211–911)

## 2019-10-06 LAB — COMPREHENSIVE METABOLIC PANEL
ALT: 13 U/L (ref 0–35)
AST: 13 U/L (ref 0–37)
Albumin: 4.4 g/dL (ref 3.5–5.2)
Alkaline Phosphatase: 75 U/L (ref 39–117)
BUN: 8 mg/dL (ref 6–23)
CO2: 27 mEq/L (ref 19–32)
Calcium: 9.8 mg/dL (ref 8.4–10.5)
Chloride: 105 mEq/L (ref 96–112)
Creatinine, Ser: 0.9 mg/dL (ref 0.40–1.20)
GFR: 87.16 mL/min (ref >60.00)
Glucose, Bld: 88 mg/dL (ref 70–99)
Potassium: 3.9 mEq/L (ref 3.5–5.1)
Sodium: 138 mEq/L (ref 135–145)
Total Bilirubin: 0.4 mg/dL (ref 0.2–1.2)
Total Protein: 7.6 g/dL (ref 6.0–8.3)

## 2019-10-06 LAB — IBC + FERRITIN
Ferritin: 9.7 ng/mL — ABNORMAL LOW (ref 10.0–291.0)
Iron: 28 ug/dL — ABNORMAL LOW (ref 42–145)
Saturation Ratios: 5.4 % — ABNORMAL LOW (ref 20.0–50.0)
Transferrin: 370 mg/dL — ABNORMAL HIGH (ref 212.0–360.0)

## 2019-10-06 LAB — FOLATE: Folate: 17.2 ng/mL (ref >5.9)

## 2019-10-06 LAB — LIPASE: Lipase: 24 U/L (ref 11.0–59.0)

## 2019-10-06 NOTE — Progress Notes (Signed)
    History of Present Illness: This is a 34 year old female evaluated on June 2 with history of nausea, vomiting, upper abdominal pain and intermittent black stools.  See 08/02/2019 office note. She was recommended to remain on a daily PPI and scheduled for EGD for further evaluation however she canceled her EGD.  She was evaluated at [facility] urgent care on June 30 for right-sided abdominal pain and constipation.  Abdominal pain was felt to be secondary to constipation.  KUB on August 30, 2019 showed a large amount of stool in the right colon and transverse colon. Current complaint is right sided abdominal pain nausea and abdominal bloating. Constipation partially controlled with prn Miralax.  Although she has bowel movements on a regular basis it does not appear she has complete evacuation with bowel movements.  Amitiza was not effective so she discontinued it. RUQ ultrasound in 02/18/2018 was normal. She has not noted dark stool in over one month.   Current Medications, Allergies, Past Medical History, Past Surgical History, Family History and Social History were reviewed in [employer] record.   Physical Exam: General: Well developed, well nourished, no acute distress Head: Normocephalic and atraumatic Eyes:  sclerae anicteric, EOMI Ears: Normal auditory acuity Mouth: Not examined, mask on during Covid-19 pandemic Lungs: Clear throughout to auscultation Heart: Regular rate and rhythm; no murmurs, rubs or bruits Abdomen: Soft, generalized tenderness and non distended. No masses, hepatosplenomegaly or hernias noted. Normal Bowel sounds Rectal: Not done Musculoskeletal: Symmetrical with no gross deformities  Pulses:  Normal pulses noted Extremities: No clubbing, cyanosis, edema or deformities noted Neurological: Alert oriented x 4, grossly nonfocal Psychological:  Alert and cooperative. Normal mood and affect   Assessment and Recommendations:  1.  Abdominal pain, abdominal  bloating, nausea, constipation, incomplete fecal evacuation. Current symptoms appeary secondary to constipation or IBS-C. Amitiza was not effective. Change Miralax to qd (not prn). If not effective increase Miralax to bid. Increase daily water intake. If Miralax not effective change tp Trulance or Linzess. CBC, CMP, lipase today.  REV in 6 weeks with me or PG.   2. History of dark stool concerning for melena. Recommended proceeding with EGD. She is no longer having dark stool and she does not want to proceed with EGD at this time. Possible gastritis, duodenitis or ulcer. Avoid or at least minimize NSAIDs. Continue pantoprazole at 40 mg po qd. Discuss EGD again if dark stools, melena recur.

## 2019-10-06 NOTE — Patient Instructions (Signed)
Your provider has requested that you go to the basement level for lab work before leaving today. Press "B" on the elevator. The lab is located at the first door on the left as you exit the elevator.  We have sent the following medications to your pharmacy for you to pick up at your convenience: pantoprazole 40 mg daily.   Increase your Miralax to 17 grams in 8 oz of water daily.  Thank you for choosing me and [facility].  Pricilla Riffle. Dagoberto Ligas., MD., [facility]

## 2019-10-09 ENCOUNTER — Other Ambulatory Visit: Payer: Self-pay

## 2019-10-09 DIAGNOSIS — D509 Iron deficiency anemia, unspecified: Secondary | ICD-10-CM

## 2019-10-09 NOTE — Progress Notes (Signed)
cbc

## 2019-10-12 ENCOUNTER — Other Ambulatory Visit: Payer: Self-pay | Admitting: Family Medicine

## 2019-10-12 MED FILL — PANTOPRAZOLE SOD DR 20 MG T: 20 | 30 days supply | Qty: 60 | Fill #0

## 2019-10-12 NOTE — Telephone Encounter (Signed)
Requested medication (s) are due for refill today: {yes  Requested medication (s) are on the active medication list: yes  Last refill: 09/22/19  #30  0 refills  Future visit scheduled yes  Notes to clinic  not delegated  Requested Prescriptions  Pending Prescriptions Disp Refills   ondansetron (ZOFRAN-ODT) 4 MG disintegrating tablet [Pharmacy Med Name: ONDANSETRON ODT 4 MG TABLET 4 Tablet] 30 tablet 0    Sig: TAKE 1 TABLET (4 MG TOTAL) BY MOUTH EVERY 8 (EIGHT) HOURS AS NEEDED FOR NAUSEA OR VOMITING.      Not Delegated - Gastroenterology: Antiemetics Failed - 10/12/2019  4:39 PM      Failed - This refill cannot be delegated      Passed - Valid encounter within last 6 months    Recent Outpatient Visits           2 months ago [place], [place], [place]   3 months ago Anemia, unspecified type   [place], [place]   5 months ago Chronic constipation   [place], [place]   6 months ago [place], MD   6 months ago Chronic constipation   [place], Zelda W, NP       Future Appointments             In 2 months Juleen China Bayard Beaver, DO Primary Care at [facility]

## 2019-10-13 MED FILL — ONDANSETRON ODT 4 MG TABLET: 4 | 10 days supply | Qty: 30 | Fill #0

## 2019-10-17 ENCOUNTER — Ambulatory Visit: Payer: Self-pay

## 2019-10-30 ENCOUNTER — Other Ambulatory Visit: Payer: Self-pay | Admitting: Family Medicine

## 2019-10-30 MED FILL — PANTOPRAZOLE SOD DR 20 MG T: 20 | 30 days supply | Qty: 60 | Fill #1

## 2019-10-30 NOTE — Telephone Encounter (Signed)
Requested medication (s) are due for refill today: yes  Requested medication (s) are on the active medication list: yes  Last refill:  08/10/19 #30  Future visit scheduled: no  Notes to clinic:  Please review for refill. Refill not delegated per protocol.    Requested Prescriptions  Pending Prescriptions Disp Refills   ondansetron (ZOFRAN-ODT) 4 MG disintegrating tablet [Pharmacy Med Name: ONDANSETRON ODT 4 MG TABLET 4 Tablet] 30 tablet 0    Sig: TAKE 1 TABLET (4 MG TOTAL) BY MOUTH EVERY 8 (EIGHT) HOURS AS NEEDED FOR NAUSEA OR VOMITING.      Not Delegated - Gastroenterology: Antiemetics Failed - 10/30/2019  5:02 PM      Failed - This refill cannot be delegated      Passed - Valid encounter within last 6 months    Recent Outpatient Visits           2 months ago [place], [place], [place]   3 months ago Anemia, unspecified type   [place], [place], [place]   6 months ago Chronic constipation   [place], MD   6 months ago [place], MD   7 months ago Chronic constipation   [place] Gildardo Pounds, NP       Future Appointments             In 1 month Juleen China Bayard Beaver, DO Primary Care at [facility]

## 2019-11-01 ENCOUNTER — Telehealth: Payer: Self-pay | Admitting: Physician Assistant

## 2019-11-01 DIAGNOSIS — R102 Pelvic and perineal pain: Secondary | ICD-10-CM

## 2019-11-01 LAB — POCT URINALYSIS DIP (CLINITEK)
Bilirubin, UA: NEGATIVE
Glucose, UA: NEGATIVE mg/dL
Ketones, POC UA: NEGATIVE mg/dL
Leukocytes, UA: NEGATIVE
Nitrite, UA: NEGATIVE
POC PROTEIN,UA: NEGATIVE
Spec Grav, UA: 1.025 (ref 1.010–1.025)
Urobilinogen, UA: 0.2 E.U./dL
pH, UA: 6.5 (ref 5.0–8.0)

## 2019-11-01 MED FILL — ONDANSETRON ODT 4 MG TABLET: 4 | 10 days supply | Qty: 30 | Fill #0

## 2019-11-01 NOTE — Progress Notes (Signed)
Established Patient Office Visit  Subjective:  Patient ID: Sherri [name], female    DOB: 09/30/1985  Age: 34 y.o. MRN: [id number]  CC:  Chief Complaint  Patient presents with  . Dysuria     Virtual Visit via Telephone Note  I connected with Armen Pickup Caligiuri on 11/01/19 at 10:15 AM EDT by telephone and verified that I am speaking with the correct person using two identifiers.  Location: Patient: Home Provider: [facility]    I discussed the limitations, risks, security and privacy concerns of performing an evaluation and management service by telephone and the availability of in person appointments. I also discussed with the patient that there may be a patient responsible charge related to this service. The patient expressed understanding and agreed to proceed.   History of Present Illness: Reports that she started having burning, itching, lower abdominal pain approximately 2 days ago.  Denies vaginal discharge, has been taking ibuprofen without much relief.  Reports that she was seen by gastroenterology at the beginning of August, was encouraged to take iron, states that she is taking it once a day, states she still feels fatigued.  Did not start over-the-counter probiotic.    Observations/Objective: Medical history and current medications reviewed, no physical exam completed    Past Medical History:  Diagnosis Date  . Fibromyalgia    denies today  . Genital herpes   . Hypertension    gestational  . 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 Urinary tract infection   . Uterine fibroid     Past Surgical History:  Procedure Laterality Date  . CESAREAN SECTION N/A 06/05/2014   Procedure: CESAREAN SECTION;  Surgeon: Truett Mainland, DO;  Location: [facility];  Service: Obstetrics;  Laterality: N/A;  . WISDOM TOOTH EXTRACTION      Family History  Problem Relation Age of Onset  . Hypertension Mother   . Hypertension Father    . Breast cancer Maternal Grandmother        46  . Colon cancer Neg Hx   . Colon polyps Neg Hx   . Kidney disease Neg Hx   . Diabetes Neg Hx   . Esophageal cancer Neg Hx   . Gallbladder disease Neg Hx   . Heart disease Neg Hx   . Asthma Neg Hx   . Cancer Neg Hx   . Stroke Neg Hx   . Stomach cancer Neg Hx     Social History   Socioeconomic History  . Marital status: Single    Spouse name: Not on file  . Number of children: Not on file  . Years of education: Not on file  . Highest education level: Not on file  Occupational History  . Occupation: [employer]  Tobacco Use  . Smoking status: Never Smoker  . Smokeless tobacco: Never Used  Vaping Use  . Vaping Use: Never used  Substance and Sexual Activity  . Alcohol use: No  . Drug use: No  . Sexual activity: Yes    Birth control/protection: None  Other Topics Concern  . Not on file  Social History Narrative  . Not on file   Social Determinants of Health   Financial Resource Strain:   . Difficulty of Paying Living Expenses: Not on file  Food Insecurity:   . Worried About Charity fundraiser in the Last Year: Not on file  . Ran Out of Food in the Last Year: Not on file  Transportation Needs:   . Film/video editor (Medical): Not on file  . Lack of Transportation (Non-Medical): Not on file  Physical Activity:   . Days of Exercise per Week: Not on file  . Minutes of Exercise per Session: Not on file  Stress:   . Feeling of Stress : Not on file  Social Connections:   . Frequency of Communication with Friends and Family: Not on file  . Frequency of Social Gatherings with Friends and Family: Not on file  . Attends Religious Services: Not on file  . Active Member of Clubs or Organizations: Not on file  . Attends Archivist Meetings: Not on file  . Marital Status: Not on file  Intimate Partner Violence:   . Fear of Current or Ex-Partner: Not on file  . Emotionally Abused: Not on file  . Physically  Abused: Not on file  . Sexually Abused: Not on file    Outpatient Medications Prior to Visit  Medication Sig Dispense Refill  . cyclobenzaprine (FLEXERIL) 10 MG tablet Take 1 tablet (10 mg total) by mouth 2 (two) times daily as needed for muscle spasms. 20 tablet 0  . fluticasone (FLONASE) 50 MCG/ACT nasal spray Place 2 sprays into both nostrils daily. 1 g 0  . hydrOXYzine (ATARAX/VISTARIL) 50 MG tablet Take 1 tablet (50 mg total) by mouth at bedtime as needed. 30 tablet 0  . ipratropium (ATROVENT) 0.03 % nasal spray Place 2 sprays into both nostrils every 12 (twelve) hours. 30 mL 12  . loratadine (CLARITIN) 10 MG tablet Take 1 tablet (10 mg total) by mouth daily. (Patient taking differently: Take 10 mg by mouth daily as needed for allergies. )    . lubiprostone (AMITIZA) 24 MCG capsule Take 1 capsule (24 mcg total) by mouth 2 (two) times daily with a meal. 60 capsule 3  . metroNIDAZOLE (METROGEL VAGINAL) 0.75 % vaginal gel Place 1 Applicatorful vaginally at bedtime. 1 applicator full to vagina at night X5 days 70 g 0  . metroNIDAZOLE (METROGEL VAGINAL) 0.75 % vaginal gel Place 1 applicatorful vaginally at bedtime twice weekly for 6 months 70 g 2  . naproxen (NAPROSYN) 500 MG tablet Take 1 tablet (500 mg total) by mouth 2 (two) times daily. 30 tablet 0  . nystatin (MYCOSTATIN) 100000 UNIT/ML suspension Take 5 mLs (500,000 Units total) by mouth 4 (four) times daily. 200 mL 0  . olopatadine (PATANOL) 0.1 % ophthalmic solution Place 1 drop into both eyes 2 (two) times daily. (Patient taking differently: Place 1 drop into both eyes 2 (two) times daily as needed for allergies. ) 5 mL 12  . ondansetron (ZOFRAN) 4 MG tablet TAKE 1 TABLET (4 MG TOTAL) BY MOUTH EVERY 8 (EIGHT) HOURS AS NEEDED FOR NAUSEA OR VOMITING. 30 tablet 0  . ondansetron (ZOFRAN-ODT) 4 MG disintegrating tablet TAKE 1 TABLET (4 MG TOTAL) BY MOUTH EVERY 8 (EIGHT) HOURS AS NEEDED FOR NAUSEA OR VOMITING. 30 tablet 0  . pantoprazole  (PROTONIX) 20 MG tablet TAKE 1 TABLET (20 MG TOTAL) BY MOUTH 2 (TWO) TIMES DAILY. 60 tablet 1  . polyethylene glycol (MIRALAX) 17 g packet Take 17 g by mouth daily as needed for moderate constipation or severe constipation. 10 each 0   No facility-administered medications prior to visit.    Allergies  Allergen Reactions  . Macrobid [Nitrofurantoin] Nausea And Vomiting    ROS Review of Systems  Constitutional: Positive for fatigue. Negative for chills and fever.  HENT: Negative.   Eyes:  Negative.   Respiratory: Negative.   Cardiovascular: Negative.   Gastrointestinal: Positive for abdominal pain. Negative for nausea and vomiting.  Endocrine: Negative.   Genitourinary: Negative.   Musculoskeletal: Negative.   Skin: Negative.   Allergic/Immunologic: Negative.   Neurological: Negative.   Hematological: Negative.   Psychiatric/Behavioral: Negative.       Objective:     There were no vitals taken for this visit. Wt Readings from Last 3 Encounters:  10/06/19 195 lb 2 oz (88.5 kg)  09/26/19 193 lb (87.5 kg)  08/02/19 196 lb (88.9 kg)     Health Maintenance Due  Topic Date Due  . COVID-19 Vaccine (1) Never done  . PAP SMEAR-Modifier  07/17/2017  . INFLUENZA VACCINE  10/01/2019    There are no preventive care reminders to display for this patient.  Lab Results  Component Value Date   TSH 0.926 04/06/2019   Lab Results  Component Value Date   WBC 7.5 10/06/2019   HGB 10.7 (L) 10/06/2019   HCT 33.0 (L) 10/06/2019   MCV 79.1 10/06/2019   PLT 312.0 10/06/2019   Lab Results  Component Value Date   NA 138 10/06/2019   K 3.9 10/06/2019   CO2 27 10/06/2019   GLUCOSE 88 10/06/2019   BUN 8 10/06/2019   CREATININE 0.90 10/06/2019   BILITOT 0.4 10/06/2019   ALKPHOS 75 10/06/2019   AST 13 10/06/2019   ALT 13 10/06/2019   PROT 7.6 10/06/2019   ALBUMIN 4.4 10/06/2019   CALCIUM 9.8 10/06/2019   ANIONGAP 8 06/21/2019   GFR 87.16 10/06/2019   Lab Results   Component Value Date   CHOL 198 01/07/2017   Lab Results  Component Value Date   HDL 56 01/07/2017   Lab Results  Component Value Date   LDLCALC 128 (H) 01/07/2017   Lab Results  Component Value Date   TRIG 57 01/07/2017   Lab Results  Component Value Date   CHOLHDL 3.5 01/07/2017   Lab Results  Component Value Date   HGBA1C 5.6 09/26/2019      Assessment & Plan:   Problem List Items Addressed This Visit      Other   Pelvic pain in female - Primary   Relevant Orders   POCT URINALYSIS DIP (CLINITEK) (Completed)     Assessment and Plan: 1. Pelvic pain in female Patient encouraged to continue with recommended treatment from last office visit at the end of July 2021.  Encourage patient to repeat 5 days of MetroGel followed by twice weekly application for 6 months.  Encourage patient to start over-the-counter probiotic.  Gave patient education on proper dosing of iron according to recommendation from gastroenterology dated 10/06/2019 to take it twice a day.  She is due to follow-up with them in November.  Encourage patient to keep upcoming appointment with Dr. Juleen China in October 2021 to establish primary care. - POCT URINALYSIS DIP (CLINITEK)   Follow Up Instructions:    I discussed the assessment and treatment plan with the patient. The patient was provided an opportunity to ask questions and all were answered. The patient agreed with the plan and demonstrated an understanding of the instructions.   The patient was advised to call back or seek an in-person evaluation if the symptoms worsen or if the condition fails to improve as anticipated.  I provided 21 minutes of non-face-to-face time during this encounter.   Lionel Woodberry S Mayers, PA-C    No orders of the defined types were placed in this  encounter.   Follow-up: Return if symptoms worsen or fail to improve.    Loraine Grip Mayers, PA-C

## 2019-11-01 NOTE — Progress Notes (Signed)
Patient verified DOB Patient complains of burning when urination and urge. Patient still complains of nausea and back pain Patient has eaten today. Patient has taken medication today. Patient has used ibuprofen.

## 2019-11-02 MED FILL — metroNIDAZOLE 0.75 % GEL: 0.75 | 17 days supply | Qty: 70 | Fill #0

## 2019-11-07 MED FILL — metroNIDAZOLE 0.75 % GEL: 0.75 | 17 days supply | Qty: 70 | Fill #1

## 2019-11-14 ENCOUNTER — Other Ambulatory Visit: Payer: Self-pay | Admitting: Family Medicine

## 2019-11-14 NOTE — Telephone Encounter (Signed)
Requested medication (s) are due for refill today:yes  Requested medication (s) are on the active medication list: yes  Last refill: 11/01/19  #30  0 refills  Future visit scheduled no  Notes to clinic: not delegated  Requested Prescriptions  Pending Prescriptions Disp Refills   ondansetron (ZOFRAN-ODT) 4 MG disintegrating tablet [Pharmacy Med Name: ONDANSETRON ODT 4 MG TABLET 4 Tablet] 30 tablet 0    Sig: TAKE 1 TABLET (4 MG TOTAL) BY MOUTH EVERY 8 (EIGHT) HOURS AS NEEDED FOR NAUSEA OR VOMITING.      Not Delegated - Gastroenterology: Antiemetics Failed - 11/14/2019  9:24 AM      Failed - This refill cannot be delegated      Passed - Valid encounter within last 6 months    Recent Outpatient Visits           3 months ago [facility] [place], [place], [place]   4 months ago Anemia, unspecified type   [place], [place], [place]   6 months ago Chronic constipation   [place], [place]   7 months ago [place], [place]   7 months ago Chronic constipation   [place], Vernia Buff, NP       Future Appointments             In 3 weeks Nicolette Bang, DO Primary Care at [facility]

## 2019-11-15 MED FILL — ONDANSETRON ODT 4 MG TABLET: 4 | 10 days supply | Qty: 30 | Fill #0

## 2019-11-21 ENCOUNTER — Other Ambulatory Visit: Payer: Self-pay | Admitting: Nurse Practitioner

## 2019-11-21 ENCOUNTER — Encounter: Payer: Self-pay | Admitting: Nurse Practitioner

## 2019-11-21 ENCOUNTER — Ambulatory Visit (INDEPENDENT_AMBULATORY_CARE_PROVIDER_SITE_OTHER): Payer: Self-pay | Admitting: Nurse Practitioner

## 2019-11-21 VITALS — BP 118/80 | HR 81 | Ht 59.0 in | Wt 194.5 lb

## 2019-11-21 DIAGNOSIS — D509 Iron deficiency anemia, unspecified: Secondary | ICD-10-CM

## 2019-11-21 DIAGNOSIS — R110 Nausea: Secondary | ICD-10-CM

## 2019-11-21 DIAGNOSIS — K5909 Other constipation: Secondary | ICD-10-CM

## 2019-11-21 MED ORDER — ONDANSETRON 4 MG PO TBDP: 4.0000 mg | ORAL_TABLET | Freq: Three times a day (TID) | ORAL | 3 refills | Status: DC | PRN

## 2019-11-21 MED ORDER — LINACLOTIDE 145 MCG PO CAPS: 145.0000 ug | ORAL_CAPSULE | Freq: Every day | ORAL | 3 refills | Status: DC

## 2019-11-21 NOTE — Progress Notes (Signed)
ASSESSMENT AND [name]     # Chronic daily nausea without Sherri [name].  --Nausea is random, not just postprandial and no early satiety or weight loss --No THC use. --No associated weight loss  --Still unable to get transportation for EGD.and doesn't see that situation changing. Will obtain upper gi series to rule out any gross UGI lesions.  --Zofran helps, will refill .  ---Will call with results of UGI series.   # RLQ discomfort / chronic constipation --Hard to sort out RLQ discomfort until constipation has resolved.  --Constipation possibly exacerbated by zofran and iron --Marginal improvement with daily Miralax --Water intake sounds adequate.  --Information on high fiber diet provided --Trial of linzess 145 mg daily. If insurance doesn't cover it then she will increase Miralax to BID. --Follow up with me in 4-5 weeks   # IDA --Complained of black stools a few months ago ( reason for EGD), now resolved. She didn't have transportation for EGD  --Endorses heavy menses --Only taking iron tablets once daily. Recommend she increase to BID --Already scheduled for 3 month follow up cbc, ferritin in early November  [place]     Primary Gastroenterologist :  Sherri Edward, MD  Chief Complaint : follow up on nausea and constipation.   Sherri Sherri [name] is a 34 y.o. female with PMH / [place] significant for,  but not necessarily limited to: chronic constipation, obesity  Patient was seen in June of this year with chronic intermittent abdominal pain, nausea, Sherri [name] and black stool.  On exam today there was no stool on DRE to test for blood.  Patient was continued on PPI and scheduled for an EGD which she later canceled.    Patient returned to clinic 10/06/2019 to see Sherri Sherri [name] with complaints of abdominal pain, bloating, nausea, constipation with incomplete fecal evacuation.  She had been evaluated in late June at urgent care for right sided abdominal pain and constipation.  Sherri Sherri [name] who reviewed her KUB showing a large amount of stool in the right colon and transverse colon.  He felt she could have IBS-C.  He recommended changing MIralax from prn to daily dosing. She had already tried Amitiza which didn't help. Labs obtained and remarkable for iron deficiency anemia.  Hemoglobin was 10.7, ferritin 9.7, transferrin 370, 5.4% saturation. CMET normal.  She was started on twice daily oral iron   Interval History:  Sherri Sherri [name] is taking Miralax every day. Stools are soft and she having a BM everyday but doesn't feel evacuation is complete. She continues to have intermittent RLQ discomfort unrelieved with defecation.  Sherri Sherri [name]. Takes Naproxen a couple of times a month. No other NSAIDS. Zofran helps, she takes it Q 8 hours. She doesn't use THC. She hasn't had any associated weight loss. She hasn't been able to get transportation for an EGD but hasn't had an black stools recently.    Sherri Sherri [name] has iron deficiency anemia. She reports heavy bleeding x 5 days every month. She is taking iron but only once daily ( we recommended bid)    Past Medical History:  Diagnosis Date  . Fibromyalgia    denies today  . Genital herpes   . Hypertension    gestational  . 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 Urinary tract infection   . Uterine fibroid     Current Medications, Allergies, Past Surgical History, Family History and Social History were reviewed in [employer]  medical record.   Review of Systems: No chest pain. No shortness of breath. No urinary complaints.   PHYSICAL EXAM :    Wt Readings from Last 3 Encounters:  11/21/19 194 lb 8 oz (88.2 kg)  10/06/19 195 lb 2 oz (88.5 kg)  09/26/19 193 lb (87.5 kg)    BP 118/80   Pulse 81   Ht 4\' 11"  (1.499 m)   Wt 194 lb 8 oz (88.2 kg)   BMI 39.28 kg/m  Constitutional:  Pleasant female in no acute distress. Psychiatric: Normal mood and  affect. Behavior is normal. EENT: Pupils normal.  Conjunctivae are normal. No scleral icterus. Neck supple.  Cardiovascular: Normal rate, regular rhythm. No edema Pulmonary/chest: Effort normal and breath sounds normal. No wheezing, rales or rhonchi. Abdominal: Soft, nondistended, mild RLQ tenderness. Bowel sounds active throughout. There are no masses palpable. No hepatomegaly. Neurological: Alert and oriented to person place and time. Skin: Skin is warm and dry. No rashes noted.  Sherri Savoy, NP  11/21/2019, 1:52 PM

## 2019-11-21 NOTE — Patient Instructions (Addendum)
If you are age 33 or older, your body mass index should be between 23-30. Your Body mass index is 39.28 kg/m. If this is out of the aforementioned range listed, please consider follow up with your Primary Care Provider.  If you are age 29 or younger, your body mass index should be between 19-25. Your Body mass index is 39.28 kg/m. If this is out of the aformentioned range listed, please consider follow up with your Primary Care Provider.   You have been scheduled for an Upper GI Series and Small Bowel Follow Thru at [facility] . Your appointment is on 12/07/2019 at 10:30am. Please arrive 15 minutes prior to your test for registration. Make certain not to have anything to eat or drink after midnight on the night before your test. If you need to reschedule, please contact radiology at [phone]. --------------------------------------------------------------------------------------------------------------- An upper GI series uses x rays to help diagnose problems of the upper GI tract, which includes the esophagus, stomach, and duodenum. The duodenum is the first part of the small intestine. An upper GI series is conducted by a radiology technologist or a radiologist--a doctor who specializes in x-ray imaging--at a hospital or outpatient center. While sitting or standing in front of an x-ray machine, the patient drinks barium liquid, which is often white and has a chalky consistency and taste. The barium liquid coats the lining of the upper GI tract and makes signs of disease show up more clearly on x rays. X-ray video, called fluoroscopy, is used to view the barium liquid moving through the esophagus, stomach, and duodenum. Additional x rays and fluoroscopy are performed while the patient lies on an x-ray table. To fully coat the upper GI tract with barium liquid, the technologist or radiologist may press on the abdomen or ask the patient to change position. Patients hold still in  various positions, allowing the technologist or radiologist to take x rays of the upper GI tract at different angles. If a technologist conducts the upper GI series, a radiologist will later examine the images to look for problems.  This test typically takes about 1 hour to complete --------------------------------------------------------------------------------------------------------------------------------------------- The Small Bowel Follow Thru examination is used to visualize the entire small bowel (intestines); specifically the connection between the small and large intestine. You will be positioned on a flat x-ray table and an image of your abdomen taken. Then the technologist will show the x-ray to the radiologist. The radiologist will instruct your technologist how much (1-2 cups) barium sulfate you will drink and when to begin taking the timed x-rays, usually 15-30 minutes after you begin drinking. Barium is a harmless substance that will highlight your small intestine by absorbing x-ray. The taste is chalky and it feels very heavy both in the cup and in your stomach.  After the first x-ray is taken and shown to the radiologist, he/she will determine when the next image is to be taken. This is repeated until the barium has reached the end of the small intestine and enters the beginning of the colon (cecum). At such time when the barium spills into the colon, you will be positioned on the x-ray table once again. The radiologist will use a fluoroscopic camera to take some detailed pictures of the connection between your small intestine and colon. The fluoroscope is an x-ray unit that works with a television/computer screen. The radiologist will apply pressure to your abdomen with his/her hand and a lead glove, a plastic paddle, or a paddle with an  inflated rubber balloon on the end. This is to spread apart your loops of intestine so he/she can see all areas.   This test typically takes around 1 hour  to complete.  Important  Drink plenty of water (8-10 cups/day) for a few days following the procedure to avoid constipation and blockage. The barium will make your stools white for a few days. --------------------------------------------------------------------------------------------------------------------------------------------  We have sent the following medications to your pharmacy for you to pick up at your convenience: Zofran 4mg  Linzess 165mcg  If your insurance will not cover Linzess then increase your Miralax to twice a day   High-Fiber Diet Fiber, also called dietary fiber, is a type of carbohydrate that is found in fruits, vegetables, whole grains, and beans. A high-fiber diet can have many health benefits. Your health care provider may recommend a high-fiber diet to help:  Prevent constipation. Fiber can make your bowel movements more regular.  Lower your cholesterol.  Relieve the following conditions: ? Swelling of veins in the anus (hemorrhoids). ? Swelling and irritation (inflammation) of specific areas of the digestive tract (uncomplicated diverticulosis). ? A problem of the large intestine (colon) that sometimes causes pain and diarrhea (irritable bowel syndrome, IBS).  Prevent overeating as part of a weight-loss plan.  Prevent heart disease, type 2 diabetes, and certain cancers. What is my plan? The recommended daily fiber intake in grams (g) includes:  38 g for men age 67 or younger.  30 g for men over age 97.  44 g for women age 59 or younger.  21 g for women over age 73. You can get the recommended daily intake of dietary fiber by:  Eating a variety of fruits, vegetables, grains, and beans.  Taking a fiber supplement, if it is not possible to get enough fiber through your diet. What do I need to know about a high-fiber diet?  It is better to get fiber through food sources rather than from fiber supplements. There is not a lot of research about how  effective supplements are.  Always check the fiber content on the nutrition facts label of any prepackaged food. Look for foods that contain 5 g of fiber or more per serving.  Talk with a diet and nutrition specialist (dietitian) if you have questions about specific foods that are recommended or not recommended for your medical condition, especially if those foods are not listed below.  Gradually increase how much fiber you consume. If you increase your intake of dietary fiber too quickly, you may have bloating, cramping, or gas.  Drink plenty of water. Water helps you to digest fiber. What are tips for following this plan?  Eat a wide variety of high-fiber foods.  Make sure that half of the grains that you eat each day are whole grains.  Eat breads and cereals that are made with whole-grain flour instead of refined flour or white flour.  Eat brown rice, bulgur wheat, or millet instead of white rice.  Start the day with a breakfast that is high in fiber, such as a cereal that contains 5 g of fiber or more per serving.  Use beans in place of meat in soups, salads, and pasta dishes.  Eat high-fiber snacks, such as berries, raw vegetables, nuts, and popcorn.  Choose whole fruits and vegetables instead of processed forms like juice or sauce. What foods can I eat?  Fruits Berries. Pears. Apples. Oranges. Avocado. Prunes and raisins. Dried figs. Vegetables Sweet potatoes. Spinach. Kale. Artichokes. Cabbage. Broccoli. Cauliflower. [place]  peas. Carrots. Squash. Grains Whole-grain breads. Multigrain cereal. Oats and oatmeal. Brown rice. Barley. Bulgur wheat. [place]. Quinoa. Bran muffins. Popcorn. Rye wafer crackers. Meats and other proteins Navy, kidney, and pinto beans. Soybeans. Split peas. Lentils. Nuts and seeds. Dairy Fiber-fortified yogurt. Beverages Fiber-fortified soy milk. Fiber-fortified orange juice. Other foods Fiber bars. The items listed above may not be a complete list  of recommended foods and beverages. Contact a dietitian for more options. What foods are not recommended? Fruits Fruit juice. Cooked, strained fruit. Vegetables Fried potatoes. Canned vegetables. Well-cooked vegetables. Grains White bread. Pasta made with refined flour. White rice. Meats and other proteins Fatty cuts of meat. Fried chicken or fried fish. Dairy Milk. Yogurt. Cream cheese. Sour cream. Fats and oils Butters. Beverages Soft drinks. Other foods Cakes and pastries. The items listed above may not be a complete list of foods and beverages to avoid. Contact a dietitian for more information. Summary  Fiber is a type of carbohydrate. It is found in fruits, vegetables, whole grains, and beans.  There are many health benefits of eating a high-fiber diet, such as preventing constipation, lowering blood cholesterol, helping with weight loss, and reducing your risk of heart disease, diabetes, and certain cancers.  Gradually increase your intake of fiber. Increasing too fast can result in cramping, bloating, and gas. Drink plenty of water while you increase your fiber.  The best sources of fiber include whole fruits and vegetables, whole grains, nuts, seeds, and beans. This information is not intended to replace advice given to you by your health care provider. Make sure you discuss any questions you have with your health care provider. Document Revised: 12/21/2016 Document Reviewed: 12/21/2016 Elsevier Patient Education  [employer].     Due to recent changes in healthcare laws, you may see the results of your imaging and laboratory studies on MyChart before your provider has had a chance to review them.  We understand that in some cases there may be results that are confusing or concerning to you. Not all laboratory results come back in the same time frame and the provider may be waiting for multiple results in order to interpret others.  Please give [place] 48 hours in order  for your provider to thoroughly review all the results before contacting the office for clarification of your results.

## 2019-11-21 NOTE — Progress Notes (Signed)
Reviewed and agree with management plan.  Chyler Creely T. Kimiah Hibner, MD FACG [facility]  

## 2019-11-26 ENCOUNTER — Encounter (HOSPITAL_COMMUNITY): Payer: Self-pay | Admitting: Urgent Care

## 2019-11-26 ENCOUNTER — Ambulatory Visit (HOSPITAL_COMMUNITY)
Admission: EM | Admit: 2019-11-26 | Discharge: 2019-11-26 | Disposition: A | Discharge status: Home / Self Care | Payer: Self-pay | Attending: Urgent Care | Admitting: Urgent Care

## 2019-11-26 ENCOUNTER — Other Ambulatory Visit: Payer: Self-pay

## 2019-11-26 DIAGNOSIS — R350 Frequency of micturition: Secondary | ICD-10-CM | POA: Insufficient documentation

## 2019-11-26 DIAGNOSIS — K5909 Other constipation: Secondary | ICD-10-CM | POA: Insufficient documentation

## 2019-11-26 DIAGNOSIS — R42 Dizziness and giddiness: Secondary | ICD-10-CM | POA: Insufficient documentation

## 2019-11-26 DIAGNOSIS — R300 Dysuria: Secondary | ICD-10-CM | POA: Insufficient documentation

## 2019-11-26 DIAGNOSIS — D649 Anemia, unspecified: Secondary | ICD-10-CM | POA: Insufficient documentation

## 2019-11-26 DIAGNOSIS — Z3202 Encounter for pregnancy test, result negative: Secondary | ICD-10-CM

## 2019-11-26 DIAGNOSIS — R5383 Other fatigue: Secondary | ICD-10-CM | POA: Insufficient documentation

## 2019-11-26 DIAGNOSIS — R1084 Generalized abdominal pain: Secondary | ICD-10-CM | POA: Insufficient documentation

## 2019-11-26 DIAGNOSIS — R519 Headache, unspecified: Secondary | ICD-10-CM | POA: Insufficient documentation

## 2019-11-26 LAB — POCT URINALYSIS DIPSTICK, ED / UC
Bilirubin Urine: NEGATIVE
Glucose, UA: NEGATIVE mg/dL
Hgb urine dipstick: NEGATIVE
Ketones, ur: NEGATIVE mg/dL
Leukocytes,Ua: NEGATIVE
Nitrite: NEGATIVE
Protein, ur: NEGATIVE mg/dL
Specific Gravity, Urine: 1.025 (ref 1.005–1.030)
Urobilinogen, UA: 1 mg/dL (ref 0.0–1.0)
pH: 7.5 (ref 5.0–8.0)

## 2019-11-26 LAB — POC URINE PREG, ED: Preg Test, Ur: NEGATIVE

## 2019-11-26 NOTE — ED Triage Notes (Signed)
Pt presents with lightheaded and fatigue x 3 days.

## 2019-11-26 NOTE — ED Provider Notes (Signed)
[place]   MRN: [id number] DOB: 01-22-1986  Subjective:   Sherri WALDMAN is a 34 y.o. female presenting for 3-day history of acute onset lightheadedness, fatigue.  Complete ROS as below.  Cycle just ended this past Friday.  She is Covid vaccinated.  Has very little concern for this.  Prefers not to be checked for this.  Chart review shows a history of anemia.  She is being managed by her PCP for this.  No current facility-administered medications for this encounter.  Current Outpatient Medications:  .  cyclobenzaprine (FLEXERIL) 10 MG tablet, Take 1 tablet (10 mg total) by mouth 2 (two) times daily as needed for muscle spasms., Disp: 20 tablet, Rfl: 0 .  fluticasone (FLONASE) 50 MCG/ACT nasal spray, Place 2 sprays into both nostrils daily., Disp: 1 g, Rfl: 0 .  hydrOXYzine (ATARAX/VISTARIL) 50 MG tablet, Take 1 tablet (50 mg total) by mouth at bedtime as needed., Disp: 30 tablet, Rfl: 0 .  ipratropium (ATROVENT) 0.03 % nasal spray, Place 2 sprays into both nostrils every 12 (twelve) hours., Disp: 30 mL, Rfl: 12 .  linaclotide (LINZESS) 145 MCG CAPS capsule, Take 1 capsule (145 mcg total) by mouth daily before breakfast. Take on an empty stomach, Disp: 30 capsule, Rfl: 3 .  loratadine (CLARITIN) 10 MG tablet, Take 1 tablet (10 mg total) by mouth daily. (Patient taking differently: Take 10 mg by mouth daily as needed for allergies. ), Disp:  , Rfl:  .  metroNIDAZOLE (METROGEL VAGINAL) 0.75 % vaginal gel, Place 1 Applicatorful vaginally at bedtime. 1 applicator full to vagina at night X5 days, Disp: 70 g, Rfl: 0 .  metroNIDAZOLE (METROGEL VAGINAL) 0.75 % vaginal gel, Place 1 applicatorful vaginally at bedtime twice weekly for 6 months, Disp: 70 g, Rfl: 2 .  naproxen (NAPROSYN) 500 MG tablet, Take 1 tablet (500 mg total) by mouth 2 (two) times daily., Disp: 30 tablet, Rfl: 0 .  olopatadine (PATANOL) 0.1 % ophthalmic solution, Place 1 drop into both eyes 2 (two) times daily.  (Patient taking differently: Place 1 drop into both eyes 2 (two) times daily as needed for allergies. ), Disp: 5 mL, Rfl: 12 .  ondansetron (ZOFRAN-ODT) 4 MG disintegrating tablet, Take 1 tablet (4 mg total) by mouth every 8 (eight) hours as needed for nausea or vomiting., Disp: 40 tablet, Rfl: 3 .  pantoprazole (PROTONIX) 20 MG tablet, TAKE 1 TABLET (20 MG TOTAL) BY MOUTH 2 (TWO) TIMES DAILY., Disp: 60 tablet, Rfl: 1 .  polyethylene glycol (MIRALAX) 17 g packet, Take 17 g by mouth daily as needed for moderate constipation or severe constipation., Disp: 10 each, Rfl: 0   Allergies  Allergen Reactions  . Macrobid [Nitrofurantoin] Nausea And Vomiting    Past Medical History:  Diagnosis Date  . Fibromyalgia    denies today  . Genital herpes   . Hypertension    gestational  . 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 Urinary tract infection   . Uterine fibroid      Past Surgical History:  Procedure Laterality Date  . CESAREAN SECTION N/A 06/05/2014   Procedure: CESAREAN SECTION;  Surgeon: Truett Mainland, DO;  Location: [facility];  Service: Obstetrics;  Laterality: N/A;  . WISDOM TOOTH EXTRACTION      Family History  Problem Relation Age of Onset  . Hypertension Mother   . Hypertension Father   . Breast cancer Maternal Grandmother  70  . Colon cancer Neg Hx   . Colon polyps Neg Hx   . Kidney disease Neg Hx   . Diabetes Neg Hx   . Esophageal cancer Neg Hx   . Gallbladder disease Neg Hx   . Heart disease Neg Hx   . Asthma Neg Hx   . Cancer Neg Hx   . Stroke Neg Hx   . Stomach cancer Neg Hx     Social History   Tobacco Use  . Smoking status: Never Smoker  . Smokeless tobacco: Never Used  Vaping Use  . Vaping Use: Never used  Substance Use Topics  . Alcohol use: No  . Drug use: No    Review of Systems  Constitutional: Positive for fever and malaise/fatigue.  HENT: Negative for congestion, ear pain, sinus pain and sore throat.   Eyes:  Negative for blurred vision, double vision, discharge and redness.  Respiratory: Negative for cough, hemoptysis, shortness of breath and wheezing.   Cardiovascular: Negative for chest pain.  Gastrointestinal: Positive for abdominal pain (lower right side, intermittent) and constipation (chronic, last bowel movement was yesterday, soft, regular). Negative for blood in stool, diarrhea, nausea and vomiting.  Genitourinary: Positive for dysuria and frequency. Negative for flank pain, hematuria and urgency.  Musculoskeletal: Negative for myalgias.  Skin: Negative for rash.  Neurological: Positive for dizziness. Negative for tingling, sensory change, speech change, weakness and headaches.  Psychiatric/Behavioral: Negative for depression and substance abuse.     Objective:   Vitals: BP 126/65 (BP Location: Left Arm)   Pulse 64   Temp 98.6 F (37 C) (Oral)   Resp 16   LMP 11/24/2019 (Exact Date)   SpO2 99%   Physical Exam Constitutional:      General: She is not in acute distress.    Appearance: Normal appearance. She is well-developed. She is obese. She is not ill-appearing, toxic-appearing or diaphoretic.  HENT:     Head: Normocephalic and atraumatic.     Right Ear: External ear normal.     Left Ear: External ear normal.     Nose: Nose normal.     Mouth/Throat:     Mouth: Mucous membranes are moist.     Pharynx: Oropharynx is clear.  Eyes:     General: No scleral icterus.    Extraocular Movements: Extraocular movements intact.     Pupils: Pupils are equal, round, and reactive to light.  Cardiovascular:     Rate and Rhythm: Normal rate and regular rhythm.     Heart sounds: Normal heart sounds. No murmur heard.  No friction rub. No gallop.   Pulmonary:     Effort: Pulmonary effort is normal. No respiratory distress.     Breath sounds: Normal breath sounds. No stridor. No wheezing, rhonchi or rales.  Abdominal:     General: Bowel sounds are normal. There is no distension.      Palpations: Abdomen is soft. There is no mass.     Tenderness: There is abdominal tenderness (generalized throughout). There is no right CVA tenderness, left CVA tenderness, guarding or rebound.  Skin:    General: Skin is warm and dry.     Coloration: Skin is not pale.     Findings: No rash.  Neurological:     General: No focal deficit present.     Mental Status: She is alert and oriented to person, place, and time.  Psychiatric:        Mood and Affect: Mood normal.  Behavior: Behavior normal.        Thought Content: Thought content normal.        Judgment: Judgment normal.     Results for orders placed or performed during the hospital encounter of 11/26/19 (from the past 24 hour(s))  POC Urinalysis dipstick     Status: None   Collection Time: 11/26/19 12:05 PM  Result Value Ref Range   Glucose, UA NEGATIVE NEGATIVE mg/dL   Bilirubin Urine NEGATIVE NEGATIVE   Ketones, ur NEGATIVE NEGATIVE mg/dL   Specific Gravity, Urine 1.025 1.005 - 1.030   Hgb urine dipstick NEGATIVE NEGATIVE   pH 7.5 5.0 - 8.0   Protein, ur NEGATIVE NEGATIVE mg/dL   Urobilinogen, UA 1.0 0.0 - 1.0 mg/dL   Nitrite NEGATIVE NEGATIVE   Leukocytes,Ua NEGATIVE NEGATIVE  POC urine pregnancy     Status: None   Collection Time: 11/26/19 12:09 PM  Result Value Ref Range   Preg Test, Ur NEGATIVE NEGATIVE    Assessment and Plan :   PDMP not reviewed this encounter.  1. Chronic anemia   2. Lightheadedness   3. Fatigue, unspecified type   4. Generalized abdominal pain   5. Dysuria   6. Urinary frequency   7. Chronic constipation   8. Generalized headache     Patient has multiple complaints from different systems.  No evidence of urinary tract infection, declined COVID-19 test.  Recommended she stop using MiraLAX daily, switch to an enema once daily as needed.  Emphasized continued use of Linzess, increase fiber intake.  Patient has reassuring physical exam findings, normal vital signs and no acute  findings warranting ER visit, CT scan and emergent labs.  Counseled that she also may have lightheadedness, fatigue from her chronic anemia.  Recommended continued use of her anemia medications, close follow-up with her PCP. Counseled patient on potential for adverse effects with medications prescribed/recommended today, ER and return-to-clinic precautions discussed, patient verbalized understanding.    Jaynee Eagles, PA-C 11/26/19 1238

## 2019-11-27 LAB — URINE CULTURE

## 2019-12-07 ENCOUNTER — Other Ambulatory Visit: Payer: Self-pay

## 2019-12-07 ENCOUNTER — Ambulatory Visit (HOSPITAL_COMMUNITY)
Admission: RE | Admit: 2019-12-07 | Discharge: 2019-12-07 | Disposition: A | Discharge status: Home / Self Care | Payer: Self-pay | Source: Ambulatory Visit | Attending: Nurse Practitioner | Admitting: Nurse Practitioner

## 2019-12-07 DIAGNOSIS — K5909 Other constipation: Secondary | ICD-10-CM | POA: Insufficient documentation

## 2019-12-07 DIAGNOSIS — R110 Nausea: Secondary | ICD-10-CM | POA: Insufficient documentation

## 2019-12-08 ENCOUNTER — Ambulatory Visit: Payer: Self-pay | Admitting: Internal Medicine

## 2019-12-08 NOTE — Patient Instructions (Signed)
Thank you for choosing Primary Care at [facility] to be your medical home!    Sherri [name] was seen by Melina Schools, DO today.   Sherri [name]'s primary care provider is Phill Myron, DO.   For the best care possible, you should try to see Phill Myron, DO whenever you come to the clinic.   We look forward to seeing you again soon!  If you have any questions about your visit today, please call us at [phone] or feel free to reach your primary care provider via [place].

## 2019-12-11 ENCOUNTER — Other Ambulatory Visit: Payer: Self-pay

## 2019-12-11 ENCOUNTER — Ambulatory Visit (HOSPITAL_COMMUNITY)
Admission: EM | Admit: 2019-12-11 | Discharge: 2019-12-11 | Disposition: A | Discharge status: Home / Self Care | Payer: Self-pay | Attending: Internal Medicine | Admitting: Internal Medicine

## 2019-12-11 ENCOUNTER — Encounter (HOSPITAL_COMMUNITY): Payer: Self-pay | Admitting: *Deleted

## 2019-12-11 ENCOUNTER — Encounter: Payer: Self-pay | Admitting: Internal Medicine

## 2019-12-11 ENCOUNTER — Other Ambulatory Visit (INDEPENDENT_AMBULATORY_CARE_PROVIDER_SITE_OTHER): Payer: Self-pay

## 2019-12-11 ENCOUNTER — Ambulatory Visit: Admission: EM | Admit: 2019-12-11 | Discharge: 2019-12-11 | Discharge status: Against Medical Advice | Payer: Self-pay

## 2019-12-11 ENCOUNTER — Telehealth (INDEPENDENT_AMBULATORY_CARE_PROVIDER_SITE_OTHER): Payer: Self-pay | Admitting: Internal Medicine

## 2019-12-11 ENCOUNTER — Other Ambulatory Visit (HOSPITAL_COMMUNITY)
Admission: RE | Admit: 2019-12-11 | Discharge: 2019-12-11 | Disposition: A | Discharge status: Home / Self Care | Payer: Self-pay | Source: Ambulatory Visit | Attending: Internal Medicine | Admitting: Internal Medicine

## 2019-12-11 ENCOUNTER — Other Ambulatory Visit: Payer: Self-pay | Admitting: Physician Assistant

## 2019-12-11 DIAGNOSIS — R110 Nausea: Secondary | ICD-10-CM

## 2019-12-11 DIAGNOSIS — R399 Unspecified symptoms and signs involving the genitourinary system: Secondary | ICD-10-CM | POA: Insufficient documentation

## 2019-12-11 DIAGNOSIS — R1084 Generalized abdominal pain: Secondary | ICD-10-CM

## 2019-12-11 DIAGNOSIS — S61215A Laceration without foreign body of left ring finger without damage to nail, initial encounter: Secondary | ICD-10-CM

## 2019-12-11 DIAGNOSIS — D649 Anemia, unspecified: Secondary | ICD-10-CM

## 2019-12-11 DIAGNOSIS — R5383 Other fatigue: Secondary | ICD-10-CM

## 2019-12-11 DIAGNOSIS — Z7689 Persons encountering health services in other specified circumstances: Secondary | ICD-10-CM

## 2019-12-11 MED ORDER — BACITRACIN ZINC 500 UNIT/GM EX OINT: 1.0000 "application " | TOPICAL_OINTMENT | Freq: Two times a day (BID) | CUTANEOUS | 0 refills | Status: DC

## 2019-12-11 MED FILL — ONDANSETRON ODT 4 MG TABLET: 4 | 10 days supply | Qty: 30 | Fill #0

## 2019-12-11 NOTE — Progress Notes (Signed)
Virtual Visit via Telephone Note  I connected with Sherri [name], on 12/11/2019 at 9:05 AM by telephone due to the COVID-19 pandemic and verified that I am speaking with the correct person using two identifiers.   Consent: I discussed the limitations, risks, security and privacy concerns of performing an evaluation and management service by telephone and the availability of in person appointments. I also discussed with the patient that there may be a patient responsible charge related to this service. The patient expressed understanding and agreed to proceed.   Location of Patient: Home   Location of Provider: Clinic    Persons participating in Telemedicine visit: Mara Favero [facility] Dr. Juleen China   History of Present Illness: Patient has a visit to follow up on urgent care visit on 9/26. Was seen for multiple complaints including urinary symptoms, generalized abdominal pain, fatigue. Patient reports that her symptoms have not changed and persisted. Patient reports dysuria. She also endorses a "lot" of vaginal discharge.   For her chronic anemia, she takes Fe tablets BID. However, just feels very fatigued and run down.    Past Medical History:  Diagnosis Date  . Fibromyalgia    denies today  . Genital herpes   . Hypertension    gestational  . 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 Urinary tract infection   . Uterine fibroid    Allergies  Allergen Reactions  . Macrobid [Nitrofurantoin] Nausea And Vomiting    Current Outpatient Medications on File Prior to Visit  Medication Sig Dispense Refill  . linaclotide (LINZESS) 145 MCG CAPS capsule Take 1 capsule (145 mcg total) by mouth daily before breakfast. Take on an empty stomach 30 capsule 3  . ondansetron (ZOFRAN-ODT) 4 MG disintegrating tablet Take 1 tablet (4 mg total) by mouth every 8 (eight) hours as needed for nausea or vomiting. 40 tablet 3   No current facility-administered  medications on file prior to visit.    Observations/Objective: NAD. Speaking clearly.  Work of breathing normal.  Alert and oriented. Mood appropriate.   Assessment and Plan: 1. Encounter to establish care Reviewed patient's PMH, social history, surgical history, and medications.  Is overdue for annual exam, screening blood work, and health maintenance topics. Have asked patient to return for visit to address these items.   2. Chronic anemia Last HgB 10.7 in Aug 2021.  - CBC; Future  3. UTI symptoms Urine culture in Urgent Care showed multiple species--suggest recollection. Repeat urine culture and obtain vaginal culture as patient also endorsing vaginal discharge with dysuria.  - Urine Culture; Future - Cervicovaginal ancillary only; Future  4. Other fatigue Obtain labs evaluating for causes of fatigue.  - CBC; Future - Vitamin B12; Future - VITAMIN D 25 Hydroxy (Vit-D Deficiency, Fractures); Future - TSH; Future   Follow Up Instructions: Lab visit today; Annual exam    I discussed the assessment and treatment plan with the patient. The patient was provided an opportunity to ask questions and all were answered. The patient agreed with the plan and demonstrated an understanding of the instructions.   The patient was advised to call back or seek an in-person evaluation if the symptoms worsen or if the condition fails to improve as anticipated.     I provided 20 minutes total of non-face-to-face time during this encounter including median intraservice time, reviewing previous notes, investigations, ordering medications, medical decision making, coordinating care and patient verbalized understanding at the end of the visit.  Phill Myron, D.O. [facility] at [facility]  12/11/2019, 9:05 AM

## 2019-12-11 NOTE — ED Triage Notes (Signed)
Patient in with laceration to ring finger on right hand after using knife at work ([employer]). Laceration occurred around 10:30 am today. Bleeding noted initially but bleeding has stopped at this time. Finger wrapped in band aid.

## 2019-12-11 NOTE — Discharge Instructions (Signed)
Daily wound dressing changes Tylenol/Motrin as needed for pain Return to urgent care if you notice any redness, swelling, purulent discharge or worsening pain.

## 2019-12-12 LAB — CERVICOVAGINAL ANCILLARY ONLY
Bacterial Vaginitis (gardnerella): POSITIVE — AB
Candida Glabrata: NEGATIVE
Candida Vaginitis: NEGATIVE
Chlamydia: NEGATIVE
Comment: NEGATIVE
Comment: NEGATIVE
Comment: NEGATIVE
Comment: NEGATIVE
Comment: NEGATIVE
Comment: NORMAL
Neisseria Gonorrhea: NEGATIVE
Trichomonas: NEGATIVE

## 2019-12-12 LAB — CBC
Hematocrit: 35.6 % (ref 34.0–46.6)
Hemoglobin: 11.2 g/dL (ref 11.1–15.9)
MCH: 26.3 pg — ABNORMAL LOW (ref 26.6–33.0)
MCHC: 31.5 g/dL (ref 31.5–35.7)
MCV: 84 fL (ref 79–97)
Platelets: 327 10*3/uL (ref 150–450)
RBC: 4.26 x10E6/uL (ref 3.77–5.28)
RDW: 17.5 % — ABNORMAL HIGH (ref 11.7–15.4)
WBC: 6.6 10*3/uL (ref 3.4–10.8)

## 2019-12-12 LAB — VITAMIN D 25 HYDROXY (VIT D DEFICIENCY, FRACTURES): Vit D, 25-Hydroxy: 25.4 ng/mL — ABNORMAL LOW (ref 30.0–100.0)

## 2019-12-12 LAB — TSH: TSH: 1.38 u[IU]/mL (ref 0.450–4.500)

## 2019-12-12 LAB — VITAMIN B12: Vitamin B-12: 534 pg/mL (ref 232–1245)

## 2019-12-13 NOTE — ED Provider Notes (Signed)
[facility]    CSN: [id number] Arrival date & time: 12/11/19  1246      History   Chief Complaint Chief Complaint  Patient presents with  . Laceration    HPI Sherri [name] is a 34 y.o. female comes to the urgent care with a laceration over the distal phalanx of the right ring finger.  Patient sustained a cut with a knife when she was trying to slice some bagels at work.  Laceration occurred this morning.  Bleeding is controlled at this time. HPI  Past Medical History:  Diagnosis Date  . Fibromyalgia    denies today  . Genital herpes   . Hypertension    gestational  . 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 Urinary tract infection   . Uterine fibroid     Patient Active Problem List   Diagnosis Date Noted  . Chronic constipation 03/09/2019  . Pelvic pain in female 03/09/2019  . Hypertension   . Genital herpes   . Status post cesarean delivery 06/06/2014  . Uterine fibroid 12/25/2013  . Obesity 12/25/2013    Past Surgical History:  Procedure Laterality Date  . CESAREAN SECTION N/A 06/05/2014   Procedure: CESAREAN SECTION;  Surgeon: Truett Mainland, DO;  Location: [facility];  Service: Obstetrics;  Laterality: N/A;  . WISDOM TOOTH EXTRACTION      OB History    Gravida  1   Para  1   Term      Preterm  1   AB      Living  2     SAB      TAB      Ectopic      Multiple  1   Live Births  2            Home Medications    Prior to Admission medications   Medication Sig Start Date End Date Taking? Authorizing Provider  linaclotide Rolan Lipa) 145 MCG CAPS capsule Take 1 capsule (145 mcg total) by mouth daily before breakfast. Take on an empty stomach 11/21/19  Yes Willia Craze, NP  ondansetron (ZOFRAN-ODT) 4 MG disintegrating tablet Take 1 tablet (4 mg total) by mouth every 8 (eight) hours as needed for nausea or vomiting. 11/21/19  Yes Willia Craze, NP  pantoprazole (PROTONIX) 20 MG tablet Take 20 mg by  mouth daily.   Yes [provider]  bacitracin ointment Apply 1 application topically 2 (two) times daily. 12/11/19   LampteyMyrene Galas, MD    Family History Family History  Problem Relation Age of Onset  . Hypertension Mother   . Hypertension Father   . Breast cancer Maternal Grandmother        74  . Colon cancer Neg Hx   . Colon polyps Neg Hx   . Kidney disease Neg Hx   . Diabetes Neg Hx   . Esophageal cancer Neg Hx   . Gallbladder disease Neg Hx   . Heart disease Neg Hx   . Asthma Neg Hx   . Cancer Neg Hx   . Stroke Neg Hx   . Stomach cancer Neg Hx     Social History Social History   Tobacco Use  . Smoking status: Never Smoker  . Smokeless tobacco: Never Used  Vaping Use  . Vaping Use: Never used  Substance Use Topics  . Alcohol use: No  . Drug use: No     Allergies   Macrobid [nitrofurantoin]  Review of Systems Review of Systems  Respiratory: Negative.   Musculoskeletal: Negative.   Skin: Positive for wound. Negative for color change.     Physical Exam Triage Vital Signs ED Triage Vitals [12/11/19 1515]  Enc Vitals Group     BP (!) 148/68     Pulse Rate 65     Resp      Temp 98.3 F (36.8 C)     Temp src      SpO2 100 %     Weight      Height      Head Circumference      Peak Flow      Pain Score 7     Pain Loc      Pain Edu?      Excl. in [place]?    No data found.  Updated Vital Signs BP (!) 148/68 (BP Location: Right Arm)   Pulse 65   Temp 98.3 F (36.8 C)   LMP 11/24/2019 (Exact Date)   SpO2 100%   Visual Acuity Right Eye Distance:   Left Eye Distance:   Bilateral Distance:    Right Eye Near:   Left Eye Near:    Bilateral Near:     Physical Exam Vitals and nursing note reviewed.  Constitutional:      General: She is not in acute distress.    Appearance: She is not ill-appearing.  Skin:    Capillary Refill: Capillary refill takes less than 2 seconds.     Comments: Half an inch laceration over the tip of the  right ring finger.  No bleeding noted.  Neurological:     Mental Status: She is alert.      UC Treatments / Results  Labs (all labs ordered are listed, but only abnormal results are displayed) Labs Reviewed - No data to display  EKG   Radiology No results found.  Procedures Procedures (including critical care time)  Medications Ordered in UC Medications - No data to display  Initial Impression / Assessment and Plan / UC Course  I have reviewed the triage vital signs and the nursing notes.  Pertinent labs & imaging results that were available during my care of the patient were reviewed by me and considered in my medical decision making (see chart for details).     1.  Laceration over the tip of the right ring finger: No laceration needed Wound was cleaned and dressed Daily wound dressing changes.  Final Clinical Impressions(s) / UC Diagnoses   Final diagnoses:  Laceration of left ring finger without foreign body without damage to nail, initial encounter     Discharge Instructions     Daily wound dressing changes Tylenol/Motrin as needed for pain Return to urgent care if you notice any redness, swelling, purulent discharge or worsening pain.   ED Prescriptions    Medication Sig Dispense Auth. Provider   bacitracin ointment Apply 1 application topically 2 (two) times daily. 120 g Kennisha Qin, Myrene Galas, MD     PDMP not reviewed this encounter.   Chase Picket, MD 12/13/19 [phone]

## 2019-12-15 ENCOUNTER — Other Ambulatory Visit: Payer: Self-pay | Admitting: Family Medicine

## 2019-12-15 NOTE — Telephone Encounter (Signed)
Medication Refill - Medication: pantoprazole  Has the patient contacted their pharmacy? Yes.   (Agent: If no, request that the patient contact the pharmacy for the refill.) (Agent: If yes, when and what did the pharmacy advise?)  Preferred Pharmacy (with phone number or street name):[facility], [place]: Please be advised that RX refills may take up to 3 business days. We ask that you follow-up with your pharmacy.

## 2019-12-16 LAB — URINE CULTURE

## 2019-12-16 NOTE — Telephone Encounter (Signed)
   Notes to clinic Historical provider

## 2019-12-18 ENCOUNTER — Emergency Department (HOSPITAL_COMMUNITY): Payer: Self-pay

## 2019-12-18 ENCOUNTER — Emergency Department (HOSPITAL_COMMUNITY)
Admission: EM | Admit: 2019-12-18 | Discharge: 2019-12-18 | Disposition: A | Discharge status: Home / Self Care | Payer: Self-pay | Attending: Emergency Medicine | Admitting: Emergency Medicine

## 2019-12-18 ENCOUNTER — Other Ambulatory Visit: Payer: Self-pay

## 2019-12-18 ENCOUNTER — Encounter (HOSPITAL_COMMUNITY): Payer: Self-pay | Admitting: Emergency Medicine

## 2019-12-18 ENCOUNTER — Other Ambulatory Visit (HOSPITAL_COMMUNITY): Payer: Self-pay | Admitting: Physician Assistant

## 2019-12-18 DIAGNOSIS — Z20822 Contact with and (suspected) exposure to covid-19: Secondary | ICD-10-CM | POA: Insufficient documentation

## 2019-12-18 DIAGNOSIS — R1084 Generalized abdominal pain: Secondary | ICD-10-CM | POA: Insufficient documentation

## 2019-12-18 DIAGNOSIS — J069 Acute upper respiratory infection, unspecified: Secondary | ICD-10-CM | POA: Insufficient documentation

## 2019-12-18 DIAGNOSIS — I10 Essential (primary) hypertension: Secondary | ICD-10-CM | POA: Insufficient documentation

## 2019-12-18 LAB — CBC WITH DIFFERENTIAL/PLATELET
Abs Immature Granulocytes: 0.03 10*3/uL (ref 0.00–0.07)
Basophils Absolute: 0.1 10*3/uL (ref 0.0–0.1)
Basophils Relative: 1 %
Eosinophils Absolute: 0.2 10*3/uL (ref 0.0–0.5)
Eosinophils Relative: 3 %
HCT: 38.9 % (ref 36.0–46.0)
Hemoglobin: 12.1 g/dL (ref 12.0–15.0)
Immature Granulocytes: 1 %
Lymphocytes Relative: 38 %
Lymphs Abs: 2.5 10*3/uL (ref 0.7–4.0)
MCH: 26.8 pg (ref 26.0–34.0)
MCHC: 31.1 g/dL (ref 30.0–36.0)
MCV: 86.3 fL (ref 80.0–100.0)
Monocytes Absolute: 0.4 10*3/uL (ref 0.1–1.0)
Monocytes Relative: 7 %
Neutro Abs: 3.3 10*3/uL (ref 1.7–7.7)
Neutrophils Relative %: 50 %
Platelets: 235 10*3/uL (ref 150–400)
RBC: 4.51 MIL/uL (ref 3.87–5.11)
RDW: 19.2 % — ABNORMAL HIGH (ref 11.5–15.5)
WBC: 6.6 10*3/uL (ref 4.0–10.5)
nRBC: 0 % (ref 0.0–0.2)

## 2019-12-18 LAB — COMPREHENSIVE METABOLIC PANEL
ALT: 14 U/L (ref 0–44)
AST: 17 U/L (ref 15–41)
Albumin: 4.2 g/dL (ref 3.5–5.0)
Alkaline Phosphatase: 61 U/L (ref 38–126)
Anion gap: 8 (ref 5–15)
BUN: 7 mg/dL (ref 6–20)
CO2: 25 mmol/L (ref 22–32)
Calcium: 8.9 mg/dL (ref 8.9–10.3)
Chloride: 104 mmol/L (ref 98–111)
Creatinine, Ser: 0.92 mg/dL (ref 0.44–1.00)
GFR, Estimated: >60 mL/min (ref >60)
Glucose, Bld: 101 mg/dL — ABNORMAL HIGH (ref 70–99)
Potassium: 3.9 mmol/L (ref 3.5–5.1)
Sodium: 137 mmol/L (ref 135–145)
Total Bilirubin: 0.6 mg/dL (ref 0.3–1.2)
Total Protein: 7.1 g/dL (ref 6.5–8.1)

## 2019-12-18 LAB — URINALYSIS, ROUTINE W REFLEX MICROSCOPIC
Bilirubin Urine: NEGATIVE
Glucose, UA: NEGATIVE mg/dL
Hgb urine dipstick: NEGATIVE
Ketones, ur: NEGATIVE mg/dL
Leukocytes,Ua: NEGATIVE
Nitrite: NEGATIVE
Protein, ur: NEGATIVE mg/dL
Specific Gravity, Urine: 1.008 (ref 1.005–1.030)
pH: 6 (ref 5.0–8.0)

## 2019-12-18 LAB — RESP PANEL BY RT PCR (RSV, FLU A&B, COVID)
Influenza A by PCR: NEGATIVE
Influenza B by PCR: NEGATIVE
Respiratory Syncytial Virus by PCR: NEGATIVE
SARS Coronavirus 2 by RT PCR: NEGATIVE

## 2019-12-18 LAB — POC URINE PREG, ED: Preg Test, Ur: NEGATIVE

## 2019-12-18 LAB — LIPASE, BLOOD: Lipase: 32 U/L (ref 11–51)

## 2019-12-18 MED ORDER — ONDANSETRON 4 MG PO TBDP: 4.0000 mg | ORAL_TABLET | Freq: Three times a day (TID) | ORAL | 0 refills | Status: DC | PRN

## 2019-12-18 MED ORDER — IOHEXOL 300 MG/ML  SOLN
100.0000 mL | Freq: Once | INTRAMUSCULAR | Status: AC | PRN
  Administered 2019-12-18: 100 mL via INTRAVENOUS

## 2019-12-18 MED ORDER — CETIRIZINE HCL 10 MG PO TABS: 10.0000 mg | ORAL_TABLET | Freq: Every day | ORAL | 0 refills | Status: DC

## 2019-12-18 MED ORDER — SODIUM CHLORIDE (PF) 0.9 % IJ SOLN
INTRAMUSCULAR | Status: AC
  Filled 2019-12-18: qty 50

## 2019-12-18 MED ORDER — DICYCLOMINE HCL 20 MG PO TABS: 20.0000 mg | ORAL_TABLET | Freq: Two times a day (BID) | ORAL | 0 refills | Status: DC

## 2019-12-18 MED ORDER — ONDANSETRON HCL 4 MG/2ML IJ SOLN
4.0000 mg | Freq: Once | INTRAMUSCULAR | Status: AC
  Administered 2019-12-18: 4 mg via INTRAVENOUS
  Filled 2019-12-18: qty 2

## 2019-12-18 MED FILL — ONDANSETRON ODT 4 MG TABLET: 4 | 3 days supply | Qty: 10 | Fill #0

## 2019-12-18 MED FILL — DICYCLOMINE 20 MG TABLET: 20 | 10 days supply | Qty: 20 | Fill #0

## 2019-12-18 NOTE — ED Triage Notes (Addendum)
Patient reports fatigue, cough and nasal congestion x 2-3 days. States that she is fully vaccinated against COVID. States that it "might be allergies." Denies chest pain or SOB.

## 2019-12-18 NOTE — ED Provider Notes (Signed)
[facility] DEPT Provider Note   CSN: [id number] Arrival date & time: 12/18/19  7829    History Chief Complaint  Patient presents with   Nasal Congestion   Cough   Fatigue    Sherri [name] is a 34 y.o. female with past medical history who presents for evaluation multiple complaints.  Patient with fatigue, cough, nasal congestion over last 2 to 3 days.  Nonpurulent rhinorrhea.  Nonproductive cough.  No chest pain, shortness of breath or hemoptysis.  No myalgias.  No loss of taste or smell.  No headache, lightheadedness or dizziness.  She is vaccinated against Covid.  No known Covid exposures.  Does have history of seasonal allergies.  Patient also with abdominal pain over the last 2 days.  Pain located to upper and mid abdomen.  Has had dysuria.  She denies any hematuria.  Pain is constant in nature.  No colicky pain.  No prior history of stones.  She has no pelvic pain, vaginal discharge, concerns for STDs.  Denies chance of pregnancy.  Pain a 3/10.  Not worse with food intake.  Pain does not radiate into back, shoulder.  Has had persistent nausea without emesis.  Denies additional aggravating or alleviating factors.  History obtained from patient and past medical records.  No interpreter used.   HPI     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    Urinary tract infection    Uterine fibroid     Patient Active Problem List   Diagnosis Date Noted   Chronic constipation 03/09/2019   Pelvic pain in female 03/09/2019   Hypertension    Genital herpes    Status post cesarean delivery 06/06/2014   Uterine fibroid 12/25/2013   Obesity 12/25/2013    Past Surgical History:  Procedure Laterality Date   CESAREAN SECTION N/A 06/05/2014   Procedure: CESAREAN SECTION;  Surgeon: Truett Mainland, DO;   Location: [facility];  Service: Obstetrics;  Laterality: N/A;   WISDOM TOOTH EXTRACTION       OB History    Gravida  1   Para  1   Term      Preterm  1   AB      Living  2     SAB      TAB      Ectopic      Multiple  1   Live Births  2           Family History  Problem Relation Age of Onset   Hypertension Mother    Hypertension Father    Breast cancer Maternal Grandmother        68   Colon cancer Neg Hx    Colon polyps Neg Hx    Kidney disease Neg Hx    Diabetes Neg Hx    Esophageal cancer Neg Hx    Gallbladder disease Neg Hx    Heart disease Neg Hx    Asthma Neg Hx    Cancer Neg Hx    Stroke Neg Hx    Stomach cancer Neg Hx     Social History   Tobacco Use   Smoking status: Never Smoker   Smokeless tobacco: Never Used  Vaping Use   Vaping Use: Never used  Substance Use Topics   Alcohol use: No  Drug use: No    Home Medications Prior to Admission medications   Medication Sig Start Date End Date Taking? Authorizing Provider  bacitracin ointment Apply 1 application topically 2 (two) times daily. 12/11/19   Lamptey, Myrene Galas, MD  cetirizine (ZYRTEC ALLERGY) 10 MG tablet Take 1 tablet (10 mg total) by mouth daily. 12/18/19   Rakisha Pincock A, PA-C  dicyclomine (BENTYL) 20 MG tablet Take 1 tablet (20 mg total) by mouth 2 (two) times daily. 12/18/19   Yaeli Hartung A, PA-C  linaclotide (LINZESS) 145 MCG CAPS capsule Take 1 capsule (145 mcg total) by mouth daily before breakfast. Take on an empty stomach 11/21/19   Willia Craze, NP  ondansetron (ZOFRAN ODT) 4 MG disintegrating tablet Take 1 tablet (4 mg total) by mouth every 8 (eight) hours as needed for nausea or vomiting. 12/18/19   Joyce Heitman A, PA-C  pantoprazole (PROTONIX) 20 MG tablet Take 20 mg by mouth daily.    [provider]    Allergies    Macrobid [nitrofurantoin]  Review of Systems   Review of Systems  Constitutional: Positive for fatigue.  Negative for activity change, appetite change, chills, diaphoresis and fever.  HENT: Positive for congestion and sinus pain. Negative for drooling, postnasal drip, rhinorrhea, sinus pressure, sneezing, sore throat, trouble swallowing and voice change.   Respiratory: Positive for cough. Negative for apnea, choking, chest tightness, shortness of breath, wheezing and stridor.   Cardiovascular: Negative.   Gastrointestinal: Positive for abdominal pain and nausea. Negative for abdominal distention, anal bleeding, blood in stool, constipation, diarrhea, rectal pain and vomiting.  Genitourinary: Positive for dysuria and flank pain. Negative for decreased urine volume, difficulty urinating, enuresis, frequency, genital sores, hematuria, menstrual problem, pelvic pain, urgency, vaginal bleeding, vaginal discharge and vaginal pain.  Musculoskeletal: Negative for arthralgias, back pain, gait problem, joint swelling, myalgias, neck pain and neck stiffness.  Skin: Negative.   Neurological: Negative.   All other systems reviewed and are negative.   Physical Exam Updated Vital Signs BP (!) 171/145 (BP Location: Right Arm)    Pulse 72    Temp 98.3 F (36.8 C) (Oral)    Resp 18    Ht 4\' 11"  (1.499 m)    Wt 83.9 kg    LMP 11/24/2019 (Exact Date) Comment: neg preg test 10.18.2021   SpO2 100%    BMI 37.37 kg/m   Physical Exam Vitals and nursing note reviewed.  Constitutional:      General: She is not in acute distress.    Appearance: She is not ill-appearing, toxic-appearing or diaphoretic.  HENT:     Head: Normocephalic and atraumatic.     Jaw: There is normal jaw occlusion.     Right Ear: Tympanic membrane, ear canal and external ear normal. There is no impacted cerumen. No hemotympanum. Tympanic membrane is not injected, scarred, perforated, erythematous, retracted or bulging.     Left Ear: Tympanic membrane, ear canal and external ear normal. There is no impacted cerumen. No hemotympanum. Tympanic  membrane is not injected, scarred, perforated, erythematous, retracted or bulging.     Ears:     Comments: No Mastoid tenderness.    Nose:     Comments: Clear rhinorrhea and congestion to bilateral nares.  No sinus tenderness.    Mouth/Throat:     Comments: Posterior oropharynx clear.  Mucous membranes moist.  Tonsils without erythema or exudate.  Uvula midline without deviation.  No evidence of PTA or RPA.  No drooling, dysphasia or trismus.  Phonation normal. Neck:     Trachea: Trachea and phonation normal.     Meningeal: Brudzinski's sign and Kernig's sign absent.     Comments: No Neck stiffness or neck rigidity.  No meningismus.  No cervical lymphadenopathy. Cardiovascular:     Comments: No murmurs rubs or gallops. Pulmonary:     Comments: Clear to auscultation bilaterally without wheeze, rhonchi or rales.  No accessory muscle usage.  Able speak in full sentences. Abdominal:     General: Bowel sounds are normal.     Palpations: Abdomen is soft.     Tenderness: There is abdominal tenderness in the right upper quadrant, right lower quadrant, epigastric area, periumbilical area and suprapubic area. There is no right CVA tenderness, left CVA tenderness, guarding or rebound. Negative signs include Murphy's sign and McBurney's sign.     Hernia: No hernia is present.       Comments: Soft without rebound or guarding.  No CVA tenderness. Generalized tenderness to right abd. Negative Murphy sign, Mcburney point.  Musculoskeletal:     Comments: Moves all 4 extremities without difficulty.  Lower extremities without edema, erythema or warmth.  Skin:    Comments: Brisk capillary refill.  No rashes or lesions.  Neurological:     Mental Status: She is alert.     Comments: Ambulatory in department without difficulty.  Cranial nerves II through XII grossly intact.  No facial droop.  No aphasia.    ED Results / Procedures / Treatments   Labs (all labs ordered are listed, but only abnormal results  are displayed) Labs Reviewed  CBC WITH DIFFERENTIAL/PLATELET - Abnormal; Notable for the following components:      Result Value   RDW 19.2 (*)    All other components within normal limits  COMPREHENSIVE METABOLIC PANEL - Abnormal; Notable for the following components:   Glucose, Bld 101 (*)    All other components within normal limits  RESP PANEL BY RT PCR (RSV, FLU A&B, COVID)  URINALYSIS, ROUTINE W REFLEX MICROSCOPIC  LIPASE, BLOOD  POC URINE PREG, ED    EKG None  Radiology DG Chest 2 View  Result Date: 12/18/2019 CLINICAL DATA:  Cough EXAM: CHEST - 2 VIEW COMPARISON:  03/16/2018 FINDINGS: Stable cardiomediastinal contour/size. Both lungs are clear. No pleural effusions or pneumothorax. The visualized skeletal structures are unremarkable. IMPRESSION: No active cardiopulmonary disease. Electronically Signed   By: Margaretha Sheffield MD   On: 12/18/2019 10:27   CT Abdomen Pelvis W Contrast  Result Date: 12/18/2019 CLINICAL DATA:  Right lower quadrant abdominal pain. EXAM: CT ABDOMEN AND PELVIS WITH CONTRAST TECHNIQUE: Multidetector CT imaging of the abdomen and pelvis was performed using the standard protocol following bolus administration of intravenous contrast. CONTRAST:  153mL OMNIPAQUE IOHEXOL 300 MG/ML  SOLN COMPARISON:  Neck ultrasound December 2019.  CT 10/11/2013. FINDINGS: Lower chest: Normal Hepatobiliary: Normal Pancreas: Normal Spleen: Normal Adrenals/Urinary Tract: Adrenal glands are normal. Kidneys are normal. Bladder is normal. Stomach/Bowel: Stomach and small intestine are normal. Normal appendix. No visible colon pathology. Vascular/Lymphatic: Normal Reproductive: Normal Other: No free fluid or air. Musculoskeletal: Normal IMPRESSION: Normal CT scan of the abdomen and pelvis. No abnormality seen to explain right lower quadrant pain. Normal appendix. Electronically Signed   By: Nelson Chimes M.D.   On: 12/18/2019 10:25    Procedures Procedures (including critical care  time)  Medications Ordered in ED Medications  sodium chloride (PF) 0.9 % injection (has no administration in time range)  ondansetron (ZOFRAN) injection 4 mg (  4 mg Intravenous Given 12/18/19 0820)  iohexol (OMNIPAQUE) 300 MG/ML solution 100 mL (100 mLs Intravenous Contrast Given 12/18/19 1008)    ED Course  I have reviewed the triage vital signs and the nursing notes.  Pertinent labs & imaging results that were available during my care of the patient were reviewed by me and considered in my medical decision making (see chart for details).  34 year old presents for evaluation multiple complaints.  She is afebrile, nonseptic, non-ill-appearing.  Has had upper respiratory complaints, congestion, rhinorrhea, cough over the last 3 days.  No chest pain, shortness of breath, hemoptysis.  No unilateral leg swelling, redness or warmth.  She is PERC negative, Wells criteria low risk.  She is fully vaccinated against Covid.  No known Covid exposures.  Has not take anything for symptoms.  She has no neck stiffness neck rigidity.  No meningismus.  Patient also with abdominal pain x2 days.  Located diffusely to right abdomen.  No pelvic pain, vaginal discharge, concerns for STDs.  Pain not worse with food intake.  Has been having some dysuria without hematuria.  No known history of stones.  Does have history of pyelonephritis.  Plan on labs, imaging and reassess.  She does not anything for pain at this time.  Labs and imaging personally reviewed and interpreted:  CBC without leukocytosis Metabolic panel without electrolyte, renal abnormality UA negative for infection Lipase 32 Pregnancy test negative Covid, flu negative DG chest without acute infiltrates, cardiomegaly, pulmonary edema, pneumothorax CT AP without acute pathology causing patient's right upper and mid abdominal pain.  She denies any lower abdominal pain, cramping, vaginal discharge.  Patient reassessed.  She has not anything for pain.   Her nausea is improved with her Zofran.  Shared decision making for CT imaging given generalized right-sided abdominal pain.  Patient has elected for CT scan which I feel is reasonable at this time given her tenderness.  We will plan to reassess  Patient reassessed. Her main concern is actually her congestion, rhinorrhea and ruling out Covid.  Reevaluation her abdomen is soft. She has no lower abdominal pain.  Low suspicion for torsion, PID, TOA.  CT reassuring for any evidence of appendicitis, no evidence of acute gallbladder pathology, normal LFTs.  No evidence of UTI.  At this point we will treat her symptomatically, no definite as cause of her upper abdominal pain.  Patient viral etiology given her upper respiratory complaints.  I have low suspicion for PE, dissection, bacterial etiology.  Her Covid test is reassuring which was negative.  Discussed possible viral etiology with patient.  Patient does not meet the SIRS or Sepsis criteria.  On repeat exam patient does not have a surgical abdomin and there are no peritoneal signs.  No indication of appendicitis, bowel obstruction, bowel perforation, cholecystitis, diverticulitis, PID or ectopic pregnancy.    The patient has been appropriately medically screened and/or stabilized in the ED. I have low suspicion for any other emergent medical condition which would require further screening, evaluation or treatment in the ED or require inpatient management.  Patient is hemodynamically stable and in no acute distress.  Patient able to ambulate in department prior to ED.  Evaluation does not show acute pathology that would require ongoing or additional emergent interventions while in the emergency department or further inpatient treatment.  I have discussed the diagnosis with the patient and answered all questions.  Pain is been managed while in the emergency department and patient has no further complaints prior to discharge.  Patient is comfortable with plan  discussed in room and is stable for discharge at this time.  I have discussed strict return precautions for returning to the emergency department.  Patient was encouraged to follow-up with PCP/specialist refer to at discharge.     MDM Rules/Calculators/A&P                          Sherri [name] was evaluated in Emergency Department on 12/18/2019 for the symptoms described in the history of present illness. She was evaluated in the context of the global COVID-19 pandemic, which necessitated consideration that the patient might be at risk for infection with the SARS-CoV-2 virus that causes COVID-19. Institutional protocols and algorithms that pertain to the evaluation of patients at risk for COVID-19 are in a state of rapid change based on information released by regulatory bodies including the CDC and federal and state organizations. These policies and algorithms were followed during the patient's care in the ED. Final Clinical Impression(s) / ED Diagnoses Final diagnoses:  Viral upper respiratory tract infection  Generalized abdominal pain    Rx / DC Orders ED Discharge Orders         Ordered    dicyclomine (BENTYL) 20 MG tablet  2 times daily        12/18/19 1108    ondansetron (ZOFRAN ODT) 4 MG disintegrating tablet  Every 8 hours PRN        12/18/19 1108    cetirizine (ZYRTEC ALLERGY) 10 MG tablet  Daily        12/18/19 1108           Jonda Alanis A, PA-C 12/18/19 1111    Drenda Freeze, MD 12/19/19 [phone]

## 2019-12-18 NOTE — Discharge Instructions (Signed)
COVID test is negative  Take the Zofran for nausea  Take the Bentyl for Abdominal pain  Take the Zyrtec and Flonase for runny nose  Return for new or worsening symptoms

## 2019-12-20 ENCOUNTER — Other Ambulatory Visit: Payer: Self-pay | Admitting: Internal Medicine

## 2019-12-20 MED ORDER — PANTOPRAZOLE SODIUM 20 MG PO TBEC
20.0000 mg | DELAYED_RELEASE_TABLET | Freq: Every day | ORAL | 2 refills | Status: DC
Start: 2019-12-20 — End: 2020-04-26

## 2019-12-20 MED FILL — PANTOPRAZOLE SOD DR 20 MG T: 20 | 30 days supply | Qty: 30 | Fill #0

## 2019-12-21 ENCOUNTER — Ambulatory Visit: Payer: Self-pay | Admitting: Physician Assistant

## 2019-12-22 ENCOUNTER — Other Ambulatory Visit: Payer: Self-pay | Admitting: Internal Medicine

## 2019-12-22 MED ORDER — METRONIDAZOLE 500 MG PO TABS: 500.0000 mg | ORAL_TABLET | Freq: Two times a day (BID) | ORAL | 0 refills | Status: DC

## 2019-12-22 MED ORDER — SULFAMETHOXAZOLE-TRIMETHOPRIM 800-160 MG PO TABS: 1.0000 | ORAL_TABLET | Freq: Two times a day (BID) | ORAL | 0 refills | Status: DC

## 2019-12-22 MED FILL — ?BACTRIM DS 800-160 MG TABS: 10 days supply | Qty: 20 | Fill #0

## 2019-12-22 MED FILL — ?METRONIDAZOLE 500 MG TABS: 500 | 7 days supply | Qty: 14 | Fill #0

## 2019-12-27 NOTE — Progress Notes (Signed)
Patient notified of results & recommendations. Expressed understanding.

## 2019-12-29 MED FILL — ONDANSETRON ODT 4 MG TABLET: 4 | 10 days supply | Qty: 30 | Fill #1

## 2019-12-29 MED FILL — PANTOPRAZOLE SOD DR 20 MG T: 20 | 30 days supply | Qty: 30 | Fill #0

## 2020-01-02 ENCOUNTER — Telehealth: Payer: Self-pay

## 2020-01-02 NOTE — Telephone Encounter (Signed)
-----   Message from Ladene Artist, MD sent at 01/02/2020  9:14 AM EDT ----- Yes ferritin only. Per last GI office note PG ordered CBC, ferritin.  ----- Message ----- From: Lindon Romp, CMA Sent: 01/02/2020   9:05 AM EDT To: Ladene Artist, MD  You wanted patient to have repeat CBC and Ferritin in 3 months from August after starting iron. Patient was in the hospital and had a recent CBC in October. Do you want her to come in now for just repeat Ferritin?

## 2020-01-02 NOTE — Telephone Encounter (Signed)
Patient informed to come by our lab in the basement for repeat lab work one day this week. Patient verbalized understanding.

## 2020-01-03 ENCOUNTER — Other Ambulatory Visit (INDEPENDENT_AMBULATORY_CARE_PROVIDER_SITE_OTHER): Payer: Self-pay

## 2020-01-03 DIAGNOSIS — D509 Iron deficiency anemia, unspecified: Secondary | ICD-10-CM

## 2020-01-03 LAB — FERRITIN: Ferritin: 11.8 ng/mL (ref 10.0–291.0)

## 2020-01-08 ENCOUNTER — Ambulatory Visit (INDEPENDENT_AMBULATORY_CARE_PROVIDER_SITE_OTHER): Payer: Self-pay | Admitting: Physician Assistant

## 2020-01-08 ENCOUNTER — Other Ambulatory Visit (INDEPENDENT_AMBULATORY_CARE_PROVIDER_SITE_OTHER): Payer: Self-pay

## 2020-01-08 ENCOUNTER — Encounter: Payer: Self-pay | Admitting: Physician Assistant

## 2020-01-08 VITALS — BP 122/84 | HR 78 | Ht 59.0 in | Wt 193.8 lb

## 2020-01-08 DIAGNOSIS — R1013 Epigastric pain: Secondary | ICD-10-CM

## 2020-01-08 DIAGNOSIS — K5900 Constipation, unspecified: Secondary | ICD-10-CM

## 2020-01-08 DIAGNOSIS — R110 Nausea: Secondary | ICD-10-CM

## 2020-01-08 LAB — SEDIMENTATION RATE: Sed Rate: 32 mm/hr — ABNORMAL HIGH (ref 0–20)

## 2020-01-08 MED ORDER — ONDANSETRON HCL 4 MG PO TABS: 4.0000 mg | ORAL_TABLET | Freq: Four times a day (QID) | ORAL | 3 refills | Status: DC | PRN

## 2020-01-08 NOTE — Patient Instructions (Addendum)
If you are age 33 or older, your body mass index should be between 23-30. Your Body mass index is 39.14 kg/m. If this is out of the aforementioned range listed, please consider follow up with your Primary Care Provider.  If you are age 44 or younger, your body mass index should be between 19-25. Your Body mass index is 39.14 kg/m. If this is out of the aformentioned range listed, please consider follow up with your Primary Care Provider.   Your provider has requested that you go to the basement level for lab work before leaving today. Press "B" on the elevator. The lab is located at the first door on the left as you exit the elevator. Make sure that you have been off of your Pantoprazole for 2 weeks before you complete the H. Pylori stool test and bring it back to the lab.  We have sent Ondansetron to your pharmacy.  Use Miralax 17 grams in 8 ounces of water/juice daily  We have sent you home with a trial of FDgard. You can buy this over the counter. Follow the directions for use on the box.  You have been scheduled to follow up with Amy on February 14, 2020 at 3:30 pm.

## 2020-01-08 NOTE — Progress Notes (Signed)
Reviewed and agree with management plan.  Nikia Mangino T. Que Meneely, MD FACG [facility]  

## 2020-01-08 NOTE — Progress Notes (Signed)
Subjective:    Patient ID: Sherri [name], female    DOB: 07/23/1985, 34 y.o.   MRN: [id number]  HPI Sherri [name] is a pleasant 34 year old African-American female, established with Dr. Fuller Plan and recently seen by Tye Savoy on 11/21/2019 who comes in today for routine follow-up.  She has history of chronic constipation, iron deficiency anemia felt to be on basis of menorrhagia and chronic nausea. She underwent upper GI in October 2021 in lieu of EGD as she has transportation issues.  This showed a small hiatal hernia, some episodes of spontaneous GERD and normal-appearing stomach and duodenum. She also had CT of the abdomen pelvis in October 2021 which was unremarkable. She has been on Linzess 145 mcg daily but has not found that beneficial.  She has been using MiraLAX 17 g in 8 ounces of water daily which does work though she does not have a bowel movement every day. As far as nausea is concerned she continues to have a constant low-level queasiness which is present all day long and not affected by p.o. intake.  She has been using Zofran on a as needed basis when symptoms are more prominent.  She does not have any vomiting, no heartburn or indigestion and no ongoing abdominal pain.  Appetite has been fine weight has been stable. She has been on Protonix 20 mg daily which she does not feel helps. No cannabis use, no NSAID use.  On further discussion she says she does have some underlying anxiety.  She also has 2 small twin boys which she admits may contribute to anxiety. She has planned follow-up with GYN next week to discuss menorrhagia and iron deficiency anemia. She has been on twice daily ferrous sulfate most recent ferritin up to 11.8 and hemoglobin 12.1. Reviewing her labs hemoglobin has been in the 11-12 range over the past 2 years.  Patient says generally her menses last for 7 days and has been heavy over the past several years.  Review of Systems Pertinent positive and negative review of  systems were noted in the above HPI section.  All other review of systems was otherwise negative.  Outpatient Encounter Medications as of 01/08/2020  Medication Sig  . bacitracin ointment Apply 1 application topically 2 (two) times daily.  . cetirizine (ZYRTEC ALLERGY) 10 MG tablet Take 1 tablet (10 mg total) by mouth daily.  . ondansetron (ZOFRAN ODT) 4 MG disintegrating tablet Take 1 tablet (4 mg total) by mouth every 8 (eight) hours as needed for nausea or vomiting.  . pantoprazole (PROTONIX) 20 MG tablet Take 1 tablet (20 mg total) by mouth daily.  . [DISCONTINUED] dicyclomine (BENTYL) 20 MG tablet Take 1 tablet (20 mg total) by mouth 2 (two) times daily.  . [DISCONTINUED] linaclotide (LINZESS) 145 MCG CAPS capsule Take 1 capsule (145 mcg total) by mouth daily before breakfast. Take on an empty stomach  . ondansetron (ZOFRAN) 4 MG tablet Take 1 tablet (4 mg total) by mouth every 6 (six) hours as needed for nausea or vomiting.  . [DISCONTINUED] metroNIDAZOLE (FLAGYL) 500 MG tablet Take 1 tablet (500 mg total) by mouth 2 (two) times daily.  . [DISCONTINUED] sulfamethoxazole-trimethoprim (BACTRIM DS) 800-160 MG tablet Take 1 tablet by mouth 2 (two) times daily.   No facility-administered encounter medications on file as of 01/08/2020.   Allergies  Allergen Reactions  . Macrobid [Nitrofurantoin] Nausea And Vomiting   Patient Active Problem List   Diagnosis Date Noted  . Chronic constipation 03/09/2019  . Pelvic pain  in female 03/09/2019  . Hypertension   . Genital herpes   . Status post cesarean delivery 06/06/2014  . Uterine fibroid 12/25/2013  . Obesity 12/25/2013   Social History   Socioeconomic History  . Marital status: Single    Spouse name: Not on file  . Number of children: Not on file  . Years of education: Not on file  . Highest education level: Not on file  Occupational History  . Occupation: [employer]  Tobacco Use  . Smoking status: Never Smoker  . Smokeless  tobacco: Never Used  Vaping Use  . Vaping Use: Never used  Substance and Sexual Activity  . Alcohol use: No  . Drug use: No  . Sexual activity: Yes    Birth control/protection: None  Other Topics Concern  . Not on file  Social History Narrative  . Not on file   Social Determinants of Health   Financial Resource Strain:   . Difficulty of Paying Living Expenses: Not on file  Food Insecurity:   . Worried About Charity fundraiser in the Last Year: Not on file  . Ran Out of Food in the Last Year: Not on file  Transportation Needs:   . Lack of Transportation (Medical): Not on file  . Lack of Transportation (Non-Medical): Not on file  Physical Activity:   . Days of Exercise per Week: Not on file  . Minutes of Exercise per Session: Not on file  Stress:   . Feeling of Stress : Not on file  Social Connections:   . Frequency of Communication with Friends and Family: Not on file  . Frequency of Social Gatherings with Friends and Family: Not on file  . Attends Religious Services: Not on file  . Active Member of Clubs or Organizations: Not on file  . Attends Archivist Meetings: Not on file  . Marital Status: Not on file  Intimate Partner Violence:   . Fear of Current or Ex-Partner: Not on file  . Emotionally Abused: Not on file  . Physically Abused: Not on file  . Sexually Abused: Not on file    Sherri [name]'s family history includes Breast cancer in her maternal grandmother; Hypertension in her father and mother.      Objective:    Vitals:   01/08/20 1559  BP: 122/84  Pulse: 78    Physical Exam Well-developed well-nourished young AA female   in no acute distress.  Height, VXBLTJ,030 BMI39.14, pleasant  HEENT; nontraumatic normocephalic, EOMI, PER R LA, sclera anicteric. Oropharynx; not examined Neck; supple, no JVD Cardiovascular; regular rate and rhythm with S1-S2, no murmur rub or gallop Pulmonary; Clear bilaterally Abdomen; soft, mildly tender in the  epigastrium, nondistended, no palpable mass or hepatosplenomegaly, bowel sounds are active Rectal; not done Skin; benign exam, no jaundice rash or appreciable lesions Extremities; no clubbing cyanosis or edema skin warm and dry Neuro/Psych; alert and oriented x4, grossly nonfocal mood and affect appropriate       Assessment & Plan:   #17 34 year old African-American female with chronic fairly constant nausea without vomiting.  Appetite good weight stable. No GERD symptoms though upper GI did shows some spontaneous reflux.  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  Patient has appointment with GYN for evaluation next week.  Plan; continue Zofran 4 mg every 6 hours as needed for nausea Start  trial of FD guard 2 p.o. twice daily 30 minutes before meals, samples were given and patient advised to continue if helpful If no benefit with FD guard consider trial of TCA or SSRI Continue MiraLAX 17 g in 8 ounces of water daily. Okay to discontinue Protonix Okay to discontinue Linzess We will check TTG and IgA, sed rate, H. pylori stool antigen off PPI We will plan office follow-up in 4 to 6 weeks.   Kyleah Pensabene S Qais Jowers PA-C 01/08/2020   Cc: Antony Blackbird, MD

## 2020-01-09 LAB — TISSUE TRANSGLUTAMINASE, IGA: (tTG) Ab, IgA: <1 U/mL

## 2020-01-09 LAB — IGA: Immunoglobulin A: 89 mg/dL (ref 47–310)

## 2020-01-09 MED FILL — ONDANSETRON HCL 4 MG TABLET: 4 | 10 days supply | Qty: 40 | Fill #0

## 2020-01-10 ENCOUNTER — Other Ambulatory Visit: Payer: Self-pay

## 2020-01-10 DIAGNOSIS — K5900 Constipation, unspecified: Secondary | ICD-10-CM

## 2020-01-10 DIAGNOSIS — R1013 Epigastric pain: Secondary | ICD-10-CM

## 2020-01-10 DIAGNOSIS — R110 Nausea: Secondary | ICD-10-CM

## 2020-01-12 LAB — H. PYLORI ANTIGEN, STOOL: H pylori Ag, Stl: NEGATIVE

## 2020-01-16 ENCOUNTER — Ambulatory Visit (INDEPENDENT_AMBULATORY_CARE_PROVIDER_SITE_OTHER): Payer: Self-pay | Admitting: Family Medicine

## 2020-01-16 ENCOUNTER — Encounter: Payer: Self-pay | Admitting: Family Medicine

## 2020-01-16 ENCOUNTER — Other Ambulatory Visit: Payer: Self-pay

## 2020-01-16 VITALS — BP 143/82 | HR 76 | Temp 97.5°F | Resp 17 | Wt 189.0 lb

## 2020-01-16 DIAGNOSIS — E559 Vitamin D deficiency, unspecified: Secondary | ICD-10-CM

## 2020-01-16 DIAGNOSIS — D509 Iron deficiency anemia, unspecified: Secondary | ICD-10-CM

## 2020-01-16 DIAGNOSIS — D649 Anemia, unspecified: Secondary | ICD-10-CM

## 2020-01-16 MED FILL — ?BACTRIM DS 800-160 MG TABS: 10 days supply | Qty: 20 | Fill #0

## 2020-01-16 NOTE — Patient Instructions (Signed)
Continue iron replacement 2-3 times daily to improve iron level. Start Vitamin D replacement 1,000 units daily. Purchase over the counter.   Vitamin D Deficiency Vitamin D deficiency is when your body does not have enough vitamin D. Vitamin D is important to your body because:  It helps your body use other minerals.  It helps to keep your bones strong and healthy.  It may help to prevent some diseases.  It helps your heart and other muscles work well. Not getting enough vitamin D can make your bones soft. It can also cause other health problems. What are the causes? This condition may be caused by:  Not eating enough foods that contain vitamin D.  Not getting enough sun.  Having diseases that make it hard for your body to absorb vitamin D.  Having a surgery in which a part of the stomach or a part of the small intestine is removed.  Having kidney disease or liver disease. What increases the risk? You are more likely to get this condition if:  You are older.  You do not spend much time outdoors.  You live in a nursing home.  You have had broken bones.  You have weak or thin bones (osteoporosis).  You have a disease or condition that changes how your body absorbs vitamin D.  You have dark skin.  You take certain medicines.  You are overweight or obese. What are the signs or symptoms?  In mild cases, there may not be any symptoms. If the condition is very bad, symptoms may include: ? Bone pain. ? Muscle pain. ? Falling often. ? Broken bones caused by a minor injury. How is this treated? Treatment may include taking supplements as told by your doctor. Your doctor will tell you what dose is best for you. Supplements may include:  Vitamin D.  Calcium. Follow these instructions at home: Eating and drinking   Eat foods that contain vitamin D, such as: ? Dairy products, cereals, or juices with added vitamin D. Check the label. ? Fish, such as salmon or  trout. ? Eggs. ? Oysters. ? Mushrooms. The items listed above may not be a complete list of what you can eat and drink. Contact a dietitian for more options. General instructions  Take medicines and supplements only as told by your doctor.  Get regular, safe exposure to natural sunlight.  Do not use a tanning bed.  Maintain a healthy weight. Lose weight if needed.  Keep all follow-up visits as told by your doctor. This is important. How is this prevented?  You can get vitamin D by: ? Eating foods that naturally contain vitamin D. ? Eating or drinking products that have vitamin D added to them, such as cereals, juices, and milk. ? Taking vitamin D or a multivitamin that contains vitamin D. ? Being in the sun. Your body makes vitamin D when your skin is exposed to sunlight. Your body changes the sunlight into a form of the vitamin that it can use. Contact a doctor if:  Your symptoms do not go away.  You feel sick to your stomach (nauseous).  You throw up (vomit).  You poop less often than normal, or you have trouble pooping (constipation). Summary  Vitamin D deficiency is when your body does not have enough vitamin D.  Vitamin D helps to keep your bones strong and healthy.  This condition is often treated by taking a supplement.  Your doctor will tell you what dose is best for you.  This information is not intended to replace advice given to you by your health care provider. Make sure you discuss any questions you have with your health care provider. Document Revised: 10/25/2017 Document Reviewed: 10/25/2017 Elsevier Patient Education  [place].       Iron Deficiency Anemia, Adult Iron-deficiency anemia is when you have a low amount of red blood cells or hemoglobin. This happens because you have too little iron in your body. Hemoglobin carries oxygen to parts of the body. Anemia can cause your body to not get enough oxygen. It may or may not cause  symptoms. Follow these instructions at home: Medicines  Take over-the-counter and prescription medicines only as told by your doctor. This includes iron pills (supplements) and vitamins.  If you cannot handle taking iron pills by mouth, ask your doctor about getting iron through: ? A vein (intravenously). ? A shot (injection) into a muscle.  Take iron pills when your stomach is empty. If you cannot handle this, take them with food.  Do not drink milk or take antacids at the same time as your iron pills.  To prevent trouble pooping (constipation), eat fiber or take medicine (stool softener) as told by your doctor. Eating and drinking   Talk with your doctor before changing the foods you eat. He or she may tell you to eat foods that have a lot of iron, such as: ? Liver. ? Lowfat (lean) beef. ? Breads and cereals that have iron added to them (fortified breads and cereals). ? Eggs. ? Dried fruit. ? Dark green, leafy vegetables.  Drink enough fluid to keep your pee (urine) clear or pale yellow.  Eat fresh fruits and vegetables that are high in vitamin C. They help your body to use iron. Foods with a lot of vitamin C include: ? Oranges. ? Peppers. ? Tomatoes. ? Mangoes. General instructions  Return to your normal activities as told by your doctor. Ask your doctor what activities are safe for you.  Keep yourself clean, and keep things clean around you (your surroundings). Anemia can make you get sick more easily.  Keep all follow-up visits as told by your doctor. This is important. Contact a doctor if:  You feel sick to your stomach (nauseous).  You throw up (vomit).  You feel weak.  You are sweating for no clear reason.  You have trouble pooping, such as: ? Pooping (having a bowel movement) less than 3 times a week. ? Straining to poop. ? Having poop that is hard, dry, or larger than normal. ? Feeling full or bloated. ? Pain in the lower belly. ? Not feeling better  after pooping. Get help right away if:  You pass out (faint). If this happens, do not drive yourself to the hospital. Call your local emergency services (911 in the [place]  You have chest pain.  You have shortness of breath that: ? Is very bad. ? Gets worse with physical activity.  You have a fast heartbeat.  You get light-headed when getting up from sitting or lying down. This information is not intended to replace advice given to you by your health care provider. Make sure you discuss any questions you have with your health care provider. Document Revised: 01/29/2017 Document Reviewed: 11/06/2015 Elsevier Patient Education  [place].

## 2020-01-16 NOTE — Progress Notes (Signed)
Patient ID: Sherri [name], female    DOB: 1985/04/17, 34 y.o.   MRN: [id number]  PCP: Nicolette Bang, DO  Chief Complaint  Patient presents with  . Anemia    Subjective:  HPI  Sherri [name] is a 34 y.o. female presents for evaluation of anemia. Patient recently had a full work-up completed by gastroenterology and was last seen by their office on 01/08/20. She reports today that she thought she was being seen here today for abnormal uterine bleeding work-up when she has an gynecologist at [facility] on [address] and was last seen in March and irregular or heavy periods was not indicated as problem expressed by the patient during that visit. She reports she continues to have fatigue. She has been taking iron twice daily. Recent ferritin level 11.8 and GI recommended a level >40 ml to reduce symptoms of fatigue. Patient also had low vitamin D during her last visit here. She reports being aware of low vitamin D and she not been taking vitamin D replacement.    Social History   Socioeconomic History  . Marital status: Single    Spouse name: Not on file  . Number of children: Not on file  . Years of education: Not on file  . Highest education level: Not on file  Occupational History  . Occupation: [employer]  Tobacco Use  . Smoking status: Never Smoker  . Smokeless tobacco: Never Used  Vaping Use  . Vaping Use: Never used  Substance and Sexual Activity  . Alcohol use: No  . Drug use: No  . Sexual activity: Yes    Birth control/protection: None  Other Topics Concern  . Not on file  Social History Narrative  . Not on file   Social Determinants of Health   Financial Resource Strain:   . Difficulty of Paying Living Expenses: Not on file  Food Insecurity:   . Worried About Charity fundraiser in the Last Year: Not on file  . Ran Out of Food in the Last Year: Not on file  Transportation Needs:   . Lack of Transportation (Medical): Not on file  . Lack of  Transportation (Non-Medical): Not on file  Physical Activity:   . Days of Exercise per Week: Not on file  . Minutes of Exercise per Session: Not on file  Stress:   . Feeling of Stress : Not on file  Social Connections:   . Frequency of Communication with Friends and Family: Not on file  . Frequency of Social Gatherings with Friends and Family: Not on file  . Attends Religious Services: Not on file  . Active Member of Clubs or Organizations: Not on file  . Attends Archivist Meetings: Not on file  . Marital Status: Not on file  Intimate Partner Violence:   . Fear of Current or Ex-Partner: Not on file  . Emotionally Abused: Not on file  . Physically Abused: Not on file  . Sexually Abused: Not on file    Family History  Problem Relation Age of Onset  . Hypertension Mother   . Hypertension Father   . Breast cancer Maternal Grandmother        41  . Colon cancer Neg Hx   . Colon polyps Neg Hx   . Kidney disease Neg Hx   . Diabetes Neg Hx   . Esophageal cancer Neg Hx   . Gallbladder disease Neg Hx   . Heart disease Neg Hx   . Asthma  Neg Hx   . Cancer Neg Hx   . Stroke Neg Hx   . Stomach cancer Neg Hx     Review of Systems Pertinent negatives listed in HPI  Allergies  Allergen Reactions  . Macrobid [Nitrofurantoin] Nausea And Vomiting    Prior to Admission medications   Medication Sig Start Date End Date Taking? Authorizing Provider  ondansetron (ZOFRAN) 4 MG tablet Take 1 tablet (4 mg total) by mouth every 6 (six) hours as needed for nausea or vomiting. 01/08/20   Esterwood, Amy S, PA-C  pantoprazole (PROTONIX) 20 MG tablet Take 1 tablet (20 mg total) by mouth daily. 12/20/19   Nicolette Bang, DO    Past Medical, Surgical Family and Social History reviewed and updated.    Objective:   Today's Vitals   01/16/20 1417  BP: (!) 143/82  Pulse: 76  Resp: 17  Temp: (!) 97.5 F (36.4 C)  TempSrc: Temporal  SpO2: 98%  Weight: 189 lb (85.7 kg)     BP Readings from Last 3 Encounters:  01/16/20 (!) 143/82  01/08/20 122/84  12/18/19 (!) 171/145    Filed Weights   01/16/20 1417  Weight: 189 lb (85.7 kg)       Physical Exam General appearance: alert, well developed, well nourished, cooperative and in no distress Head: Normocephalic, without obvious abnormality, atraumatic Respiratory: Respirations even and unlabored, normal respiratory rate Heart: rate and rhythm normal. No gallop or murmurs noted on exam  Extremities: No gross deformities Skin: Skin color, texture, turgor normal. No rashes seen  Psych: Appropriate mood and affect. No results found for: POCGLU  Lab Results  Component Value Date   HGBA1C 5.6 09/26/2019       Assessment & Plan:  1. Chronic anemia -Recent CBC-hemoglobin normal range, continue to monitor CBC -Contact gynecologist you are already established with for further work-up of abnormal vaginal bleeding  2. Iron deficiency anemia, unspecified iron deficiency anemia type Repeat iron panel in 3 months . Recommend TID iron with meals and encouraged taking with vitamin C to help improve absorption  - Iron, TIBC and Ferritin Panel; Future  3. Vitamin D deficiency -As recommended by PCP, start OTC vitamin D 1000 mcg daily  - VITAMIN D 25 Hydroxy (Vit-D Deficiency, Fractures); Future     Molli Barrows, FNP Primary Care at [facility] [address], [place] [phone]: [phone]

## 2020-01-18 MED FILL — METRONIDAZOLE 0.75 % GEL: 0.75 | 17 days supply | Qty: 70 | Fill #1

## 2020-01-18 MED FILL — ONDANSETRON ODT 4 MG TABLET: 4 | 3 days supply | Qty: 10 | Fill #0

## 2020-01-23 MED FILL — ONDANSETRON ODT 4 MG TABLET: 4 | 3 days supply | Qty: 10 | Fill #1

## 2020-02-07 MED FILL — PANTOPRAZOLE SOD DR 20 MG T: 20 | 30 days supply | Qty: 30 | Fill #1

## 2020-02-07 MED FILL — ONDANSETRON ODT 4 MG TABLET: 4 | 10 days supply | Qty: 30 | Fill #1

## 2020-02-08 MED FILL — METRONIDAZOLE 0.75 % GEL: 0.75 | 17 days supply | Qty: 70 | Fill #2

## 2020-02-10 ENCOUNTER — Other Ambulatory Visit: Payer: Self-pay

## 2020-02-10 ENCOUNTER — Ambulatory Visit (HOSPITAL_COMMUNITY)
Admission: EM | Admit: 2020-02-10 | Discharge: 2020-02-10 | Disposition: A | Discharge status: Home / Self Care | Payer: HRSA Program | Attending: Physician Assistant | Admitting: Physician Assistant

## 2020-02-10 ENCOUNTER — Encounter (HOSPITAL_COMMUNITY): Payer: Self-pay

## 2020-02-10 DIAGNOSIS — J069 Acute upper respiratory infection, unspecified: Secondary | ICD-10-CM | POA: Insufficient documentation

## 2020-02-10 DIAGNOSIS — Z20822 Contact with and (suspected) exposure to covid-19: Secondary | ICD-10-CM | POA: Diagnosis not present

## 2020-02-10 MED ORDER — AZELASTINE HCL 0.1 % NA SOLN: 2.0000 | Freq: Two times a day (BID) | NASAL | 0 refills | Status: DC

## 2020-02-10 MED ORDER — PREDNISONE 50 MG PO TABS: 50.0000 mg | ORAL_TABLET | Freq: Every day | ORAL | 0 refills | Status: DC

## 2020-02-10 NOTE — ED Triage Notes (Signed)
Patient presents to Urgent Care with complaints of fullness to bilateral ears, nasal congestion, headache since 2-3 days. Pt reports taking Naproxen for discomfort with no relief.    Denies fever, abdominal pain, N/V.

## 2020-02-10 NOTE — Discharge Instructions (Addendum)
COVID PCR testing ordered. I would like you to quarantine until testing results. Restart flonase and add azelastine as directed. If symptoms still with significant sinus pressure, cn start prednisone. Tylenol/motrin for pain and fever. Keep hydrated, urine should be clear to pale yellow in color. If experiencing shortness of breath, trouble breathing, go to the emergency department for further evaluation needed.

## 2020-02-10 NOTE — ED Provider Notes (Signed)
[place]    CSN: [id number] Arrival date & time: 02/10/20  1001      History   Chief Complaint Chief Complaint  Patient presents with  . Nasal Congestion    HPI Sherri [name] is a 34 y.o. female.   34 year old female comes in for 2-3 day of URI symptoms. Nasal congestion, sinus pressure, bilateral ear pressure. Denies cough. Denies fever, chills, body aches. Denies abdominal pain, nausea, vomiting, diarrhea. Denies shortness of breath, loss of taste/smell. COVID vaccinated.      Past Medical History:  Diagnosis Date  . Fibromyalgia    denies today  . Genital herpes   . Hypertension    gestational  . 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 Urinary tract infection   . Uterine fibroid     Patient Active Problem List   Diagnosis Date Noted  . Chronic constipation 03/09/2019  . Pelvic pain in female 03/09/2019  . Hypertension   . Genital herpes   . Status post cesarean delivery 06/06/2014  . Uterine fibroid 12/25/2013  . Obesity 12/25/2013    Past Surgical History:  Procedure Laterality Date  . CESAREAN SECTION N/A 06/05/2014   Procedure: CESAREAN SECTION;  Surgeon: Truett Mainland, DO;  Location: [facility];  Service: Obstetrics;  Laterality: N/A;  . WISDOM TOOTH EXTRACTION      OB History    Gravida  1   Para  1   Term      Preterm  1   AB      Living  2     SAB      IAB      Ectopic      Multiple  1   Live Births  2            Home Medications    Prior to Admission medications   Medication Sig Start Date End Date Taking? Authorizing Provider  azelastine (ASTELIN) 0.1 % nasal spray Place 2 sprays into both nostrils 2 (two) times daily. 02/10/20   Tasia Catchings, Nashia Remus V, PA-C  ondansetron (ZOFRAN) 4 MG tablet Take 1 tablet (4 mg total) by mouth every 6 (six) hours as needed for nausea or vomiting. 01/08/20   Esterwood, Lemon Whitacre S, PA-C  pantoprazole (PROTONIX) 20 MG tablet Take 1 tablet (20 mg total) by mouth daily.  12/20/19   Nicolette Bang, DO  predniSONE (DELTASONE) 50 MG tablet Take 1 tablet (50 mg total) by mouth daily with breakfast. 02/10/20   Ok Edwards, PA-C    Family History Family History  Problem Relation Age of Onset  . Hypertension Mother   . Hypertension Father   . Breast cancer Maternal Grandmother        21  . Colon cancer Neg Hx   . Colon polyps Neg Hx   . Kidney disease Neg Hx   . Diabetes Neg Hx   . Esophageal cancer Neg Hx   . Gallbladder disease Neg Hx   . Heart disease Neg Hx   . Asthma Neg Hx   . Cancer Neg Hx   . Stroke Neg Hx   . Stomach cancer Neg Hx     Social History Social History   Tobacco Use  . Smoking status: Never Smoker  . Smokeless tobacco: Never Used  Vaping Use  . Vaping Use: Never used  Substance Use Topics  . Alcohol use: No  . Drug use: No     Allergies  Macrobid [nitrofurantoin]   Review of Systems Review of Systems  Reason unable to perform ROS: See HPI as above.     Physical Exam Triage Vital Signs ED Triage Vitals  Enc Vitals Group     BP 02/10/20 1015 (S) (!) 141/74     Pulse Rate 02/10/20 1015 69     Resp 02/10/20 1015 16     Temp 02/10/20 1015 98.6 F (37 C)     Temp Source 02/10/20 1015 Oral     SpO2 02/10/20 1015 99 %     Weight 02/10/20 1013 190 lb (86.2 kg)     Height --      Head Circumference --      Peak Flow --      Pain Score 02/10/20 1012 9     Pain Loc --      Pain Edu? --      Excl. in [place]? --    No data found.  Updated Vital Signs BP (S) (!) 141/74 (BP Location: Left Arm) Comment: in pain  Pulse 69   Temp 98.6 F (37 C) (Oral)   Resp 16   Wt 190 lb (86.2 kg)   LMP 02/02/2020   SpO2 99%   BMI 38.38 kg/m   Physical Exam Constitutional:      General: She is not in acute distress.    Appearance: Normal appearance. She is well-developed. She is not ill-appearing, toxic-appearing or diaphoretic.  HENT:     Head: Normocephalic and atraumatic.     Right Ear: Ear canal and  external ear normal. Tympanic membrane is not erythematous or bulging.     Left Ear: Ear canal and external ear normal. Tympanic membrane is not erythematous or bulging.     Ears:     Comments: Bilateral TM opaque    Nose:     Right Sinus: Maxillary sinus tenderness and frontal sinus tenderness present.     Left Sinus: Maxillary sinus tenderness and frontal sinus tenderness present.     Mouth/Throat:     Mouth: Mucous membranes are moist.     Pharynx: Oropharynx is clear. Uvula midline.  Eyes:     Conjunctiva/sclera: Conjunctivae normal.     Pupils: Pupils are equal, round, and reactive to light.  Cardiovascular:     Rate and Rhythm: Normal rate and regular rhythm.  Pulmonary:     Effort: Pulmonary effort is normal. No accessory muscle usage, prolonged expiration, respiratory distress or retractions.     Breath sounds: No decreased air movement or transmitted upper airway sounds. No decreased breath sounds.     Comments: LCTAB Musculoskeletal:     Cervical back: Normal range of motion and neck supple.  Skin:    General: Skin is warm and dry.  Neurological:     Mental Status: She is alert and oriented to person, place, and time.      UC Treatments / Results  Labs (all labs ordered are listed, but only abnormal results are displayed) Labs Reviewed  SARS CORONAVIRUS 2 (TAT 6-24 HRS)    EKG   Radiology No results found.  Procedures Procedures (including critical care time)  Medications Ordered in UC Medications - No data to display  Initial Impression / Assessment and Plan / UC Course  I have reviewed the triage vital signs and the nursing notes.  Pertinent labs & imaging results that were available during my care of the patient were reviewed by me and considered in my medical decision making (see chart  for details).    COVID PCR test ordered. Patient to quarantine until testing results return. No alarming signs on exam. LCTAB. Symptomatic treatment discussed.  Push  fluids.  Return precautions given.  Patient expresses understanding and agrees to plan.  Final Clinical Impressions(s) / UC Diagnoses   Final diagnoses:  Viral URI    ED Prescriptions    Medication Sig Dispense Auth. Provider   azelastine (ASTELIN) 0.1 % nasal spray Place 2 sprays into both nostrils 2 (two) times daily. 30 mL Ala Kratz V, PA-C   predniSONE (DELTASONE) 50 MG tablet Take 1 tablet (50 mg total) by mouth daily with breakfast. 5 tablet Ok Edwards, PA-C     PDMP not reviewed this encounter.   Ok Edwards, PA-C 02/10/20 [phone]

## 2020-02-11 LAB — SARS CORONAVIRUS 2 (TAT 6-24 HRS): SARS Coronavirus 2: NEGATIVE

## 2020-02-13 ENCOUNTER — Other Ambulatory Visit: Payer: Self-pay | Admitting: Physician Assistant

## 2020-02-13 DIAGNOSIS — B9689 Other specified bacterial agents as the cause of diseases classified elsewhere: Secondary | ICD-10-CM

## 2020-02-14 ENCOUNTER — Ambulatory Visit: Payer: Self-pay | Admitting: Physician Assistant

## 2020-02-21 ENCOUNTER — Other Ambulatory Visit: Payer: Self-pay

## 2020-02-21 ENCOUNTER — Encounter (HOSPITAL_COMMUNITY): Payer: Self-pay

## 2020-02-21 ENCOUNTER — Ambulatory Visit (HOSPITAL_COMMUNITY)
Admission: EM | Admit: 2020-02-21 | Discharge: 2020-02-21 | Disposition: A | Discharge status: Home / Self Care | Payer: Self-pay | Attending: Family Medicine | Admitting: Family Medicine

## 2020-02-21 ENCOUNTER — Other Ambulatory Visit (HOSPITAL_COMMUNITY): Payer: Self-pay | Admitting: Family Medicine

## 2020-02-21 DIAGNOSIS — N760 Acute vaginitis: Secondary | ICD-10-CM | POA: Insufficient documentation

## 2020-02-21 DIAGNOSIS — S29012A Strain of muscle and tendon of back wall of thorax, initial encounter: Secondary | ICD-10-CM | POA: Insufficient documentation

## 2020-02-21 LAB — POC URINE PREG, ED: Preg Test, Ur: NEGATIVE

## 2020-02-21 LAB — POCT URINALYSIS DIPSTICK, ED / UC
Bilirubin Urine: NEGATIVE
Glucose, UA: NEGATIVE mg/dL
Hgb urine dipstick: NEGATIVE
Ketones, ur: NEGATIVE mg/dL
Leukocytes,Ua: NEGATIVE
Nitrite: NEGATIVE
Protein, ur: NEGATIVE mg/dL
Specific Gravity, Urine: >=1.03 (ref 1.005–1.030)
Urobilinogen, UA: 0.2 mg/dL (ref 0.0–1.0)
pH: 5.5 (ref 5.0–8.0)

## 2020-02-21 MED ORDER — FLUCONAZOLE 150 MG PO TABS: 150.0000 mg | ORAL_TABLET | Freq: Once | ORAL | 0 refills | Status: DC

## 2020-02-21 MED ORDER — CYCLOBENZAPRINE HCL 10 MG PO TABS: 10.0000 mg | ORAL_TABLET | Freq: Three times a day (TID) | ORAL | 0 refills | Status: DC | PRN

## 2020-02-21 MED FILL — FLUCONAZOLE 150 MG TABLET: 150 | 1 days supply | Qty: 1 | Fill #0

## 2020-02-21 MED FILL — CYCLOBENZAPRINE 10 MG TAB: 10 | 5 days supply | Qty: 15 | Fill #0

## 2020-02-21 MED FILL — ONDANSETRON ODT 4 MG TABLET: 4 | 10 days supply | Qty: 30 | Fill #2

## 2020-02-21 NOTE — ED Triage Notes (Signed)
Pt c/o upper right back pain for approx 1 week, denies injury/trauma to area.  Also c/o vaginal itching and white discharge with odor, urinary frequency, urgency for approx 1 week. Reports chronic nausea.   Denies vaginal rashes, bleeding, fever, v/d, hematuria, flank/abdominal pain.   Has been taking naproxen, ibuprofen-last took NSAIDs on Monday.

## 2020-02-21 NOTE — ED Provider Notes (Signed)
[place]    CSN: [id number] Arrival date & time: 02/21/20  0076      History   Chief Complaint Chief Complaint  Patient presents with  . Back Pain  . Vaginal Itching    HPI Sherri [name] is a 34 y.o. female.   Here today for 2 separate concerns - vaginal discharge, itching and urinary frequency as well as right upper back soreness for about a week now. She denies known injury but does work at [employer] and has been doing a lot of lifting lately. No shoulder pain, CP, SOB, abdominal pain, fevers, skin changes in the area. Trying  Heat and NSAIDs without benefit. States the vaginal sxs have happened before, hx of BV and yeast infections. No concern for STIs or pregnancy but OK with being tested for these. Denies hematuria, dysuria, flank pain, fever, N/V. Not trying anything OTC for these.      Past Medical History:  Diagnosis Date  . Fibromyalgia    denies today  . Genital herpes   . Hypertension    gestational  . 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 Urinary tract infection   . Uterine fibroid     Patient Active Problem List   Diagnosis Date Noted  . Chronic constipation 03/09/2019  . Pelvic pain in female 03/09/2019  . Hypertension   . Genital herpes   . Status post cesarean delivery 06/06/2014  . Uterine fibroid 12/25/2013  . Obesity 12/25/2013    Past Surgical History:  Procedure Laterality Date  . CESAREAN SECTION N/A 06/05/2014   Procedure: CESAREAN SECTION;  Surgeon: Truett Mainland, DO;  Location: [facility];  Service: Obstetrics;  Laterality: N/A;  . WISDOM TOOTH EXTRACTION      OB History    Gravida  1   Para  1   Term      Preterm  1   AB      Living  2     SAB      IAB      Ectopic      Multiple  1   Live Births  2            Home Medications    Prior to Admission medications   Medication Sig Start Date End Date Taking? Authorizing Provider  ondansetron (ZOFRAN) 4 MG tablet Take 1  tablet (4 mg total) by mouth every 6 (six) hours as needed for nausea or vomiting. 01/08/20  Yes Esterwood, Amy S, PA-C  pantoprazole (PROTONIX) 20 MG tablet Take 1 tablet (20 mg total) by mouth daily. 12/20/19  Yes Nicolette Bang, DO  azelastine (ASTELIN) 0.1 % nasal spray Place 2 sprays into both nostrils 2 (two) times daily. 02/10/20   Tasia Catchings, Amy V, PA-C  cyclobenzaprine (FLEXERIL) 10 MG tablet Take 1 tablet (10 mg total) by mouth 3 (three) times daily as needed for muscle spasms. DO NOT DRINK ALCOHOL OR DRIVE WHILE TAKING THIS MEDICATION 02/21/20   Volney American, PA-C  fluconazole (DIFLUCAN) 150 MG tablet Take 1 tablet (150 mg total) by mouth once for 1 dose. 02/21/20 02/21/20  Volney American, PA-C  predniSONE (DELTASONE) 50 MG tablet Take 1 tablet (50 mg total) by mouth daily with breakfast. 02/10/20   Ok Edwards, PA-C    Family History Family History  Problem Relation Age of Onset  . Hypertension Mother   . Hypertension Father   . Breast cancer Maternal Grandmother  70  . Colon cancer Neg Hx   . Colon polyps Neg Hx   . Kidney disease Neg Hx   . Diabetes Neg Hx   . Esophageal cancer Neg Hx   . Gallbladder disease Neg Hx   . Heart disease Neg Hx   . Asthma Neg Hx   . Cancer Neg Hx   . Stroke Neg Hx   . Stomach cancer Neg Hx     Social History Social History   Tobacco Use  . Smoking status: Never Smoker  . Smokeless tobacco: Never Used  Vaping Use  . Vaping Use: Never used  Substance Use Topics  . Alcohol use: No  . Drug use: No     Allergies   Macrobid [nitrofurantoin]   Review of Systems Review of Systems PER HPI    Physical Exam Triage Vital Signs ED Triage Vitals  Enc Vitals Group     BP 02/21/20 0901 127/65     Pulse Rate 02/21/20 0901 69     Resp 02/21/20 0901 18     Temp 02/21/20 0901 98.4 F (36.9 C)     Temp Source 02/21/20 0901 Oral     SpO2 02/21/20 0901 99 %     Weight --      Height --      Head  Circumference --      Peak Flow --      Pain Score 02/21/20 0857 8     Pain Loc --      Pain Edu? --      Excl. in [place]? --    No data found.  Updated Vital Signs BP 127/65 (BP Location: Left Arm)   Pulse 69   Temp 98.4 F (36.9 C) (Oral)   Resp 18   LMP 02/02/2020 (Approximate)   SpO2 99%   Visual Acuity Right Eye Distance:   Left Eye Distance:   Bilateral Distance:    Right Eye Near:   Left Eye Near:    Bilateral Near:     Physical Exam Vitals and nursing note reviewed.  Constitutional:      Appearance: Normal appearance. She is not ill-appearing.  HENT:     Head: Atraumatic.     Nose: Nose normal.     Mouth/Throat:     Mouth: Mucous membranes are moist.     Pharynx: Oropharynx is clear.  Eyes:     Extraocular Movements: Extraocular movements intact.     Conjunctiva/sclera: Conjunctivae normal.  Cardiovascular:     Rate and Rhythm: Normal rate and regular rhythm.     Heart sounds: Normal heart sounds.  Pulmonary:     Effort: Pulmonary effort is normal.     Breath sounds: Normal breath sounds.  Abdominal:     General: Bowel sounds are normal. There is no distension.     Palpations: Abdomen is soft.     Tenderness: There is no abdominal tenderness. There is no right CVA tenderness, left CVA tenderness or guarding.  Genitourinary:    Comments: GU exam deferred, self swab performed Musculoskeletal:        General: Tenderness (ttp over right scapular area upper back) present. No swelling or deformity. Normal range of motion.     Cervical back: Normal range of motion and neck supple.  Skin:    General: Skin is warm and dry.     Findings: No erythema or rash.  Neurological:     Mental Status: She is alert and oriented to person, place, and  time.  Psychiatric:        Mood and Affect: Mood normal.        Thought Content: Thought content normal.        Judgment: Judgment normal.      UC Treatments / Results  Labs (all labs ordered are listed, but only  abnormal results are displayed) Labs Reviewed  POCT URINALYSIS DIPSTICK, ED / UC  POC URINE PREG, ED  CERVICOVAGINAL ANCILLARY ONLY    EKG   Radiology No results found.  Procedures Procedures (including critical care time)  Medications Ordered in UC Medications - No data to display  Initial Impression / Assessment and Plan / UC Course  I have reviewed the triage vital signs and the nursing notes.  Pertinent labs & imaging results that were available during my care of the patient were reviewed by me and considered in my medical decision making (see chart for details).     U/A, urine preg benign. Aptima swab pending, give diflucan in meantime given her bothersome itching sxs and treat based on results. Will give flexeril for suspected muscular pain right upper back, discussed stretches, heat, OTC pain relievers. F/u with PCP if not resolving.   Final Clinical Impressions(s) / UC Diagnoses   Final diagnoses:  Acute vaginitis  Muscle strain of right upper back, initial encounter   Discharge Instructions   None    ED Prescriptions    Medication Sig Dispense Auth. Provider   cyclobenzaprine (FLEXERIL) 10 MG tablet Take 1 tablet (10 mg total) by mouth 3 (three) times daily as needed for muscle spasms. DO NOT DRINK ALCOHOL OR DRIVE WHILE TAKING THIS MEDICATION 15 tablet Volney American, PA-C   fluconazole (DIFLUCAN) 150 MG tablet Take 1 tablet (150 mg total) by mouth once for 1 dose. 1 tablet Volney American, [place]     PDMP not reviewed this encounter.   Merrie Roof [place], [place] 02/21/20 [phone]

## 2020-02-22 ENCOUNTER — Telehealth (HOSPITAL_COMMUNITY): Payer: Self-pay | Admitting: Emergency Medicine

## 2020-02-22 ENCOUNTER — Other Ambulatory Visit (HOSPITAL_COMMUNITY): Payer: Self-pay | Admitting: Internal Medicine

## 2020-02-22 LAB — CERVICOVAGINAL ANCILLARY ONLY
Bacterial Vaginitis (gardnerella): POSITIVE — AB
Candida Glabrata: NEGATIVE
Candida Vaginitis: POSITIVE — AB
Chlamydia: NEGATIVE
Comment: NEGATIVE
Comment: NEGATIVE
Comment: NEGATIVE
Comment: NEGATIVE
Comment: NEGATIVE
Comment: NORMAL
Neisseria Gonorrhea: NEGATIVE
Trichomonas: NEGATIVE

## 2020-02-22 MED ORDER — METRONIDAZOLE 0.75 % VA GEL: 1.0000 | Freq: Every day | VAGINAL | 0 refills | Status: DC

## 2020-02-22 MED FILL — METRONIDAZOLE 0.75 % GEL: 0.75 | 5 days supply | Qty: 70 | Fill #0

## 2020-02-29 MED FILL — PANTOPRAZOLE SOD DR 20 MG T: 20 | 30 days supply | Qty: 30 | Fill #2

## 2020-03-07 ENCOUNTER — Ambulatory Visit: Payer: Self-pay | Admitting: Gastroenterology

## 2020-03-13 ENCOUNTER — Ambulatory Visit: Payer: Self-pay | Admitting: Internal Medicine

## 2020-03-21 ENCOUNTER — Other Ambulatory Visit: Payer: Self-pay | Admitting: Gastroenterology

## 2020-03-21 ENCOUNTER — Encounter: Payer: Self-pay | Admitting: Internal Medicine

## 2020-03-21 ENCOUNTER — Ambulatory Visit (INDEPENDENT_AMBULATORY_CARE_PROVIDER_SITE_OTHER): Payer: Self-pay | Admitting: Gastroenterology

## 2020-03-21 ENCOUNTER — Telehealth: Payer: Self-pay

## 2020-03-21 ENCOUNTER — Other Ambulatory Visit: Payer: Self-pay

## 2020-03-21 ENCOUNTER — Ambulatory Visit (INDEPENDENT_AMBULATORY_CARE_PROVIDER_SITE_OTHER): Payer: Self-pay | Admitting: Internal Medicine

## 2020-03-21 ENCOUNTER — Encounter: Payer: Self-pay | Admitting: Gastroenterology

## 2020-03-21 ENCOUNTER — Other Ambulatory Visit (HOSPITAL_COMMUNITY)
Admission: RE | Admit: 2020-03-21 | Discharge: 2020-03-21 | Disposition: A | Discharge status: Home / Self Care | Payer: Self-pay | Source: Ambulatory Visit | Attending: Internal Medicine | Admitting: Internal Medicine

## 2020-03-21 VITALS — BP 120/74 | HR 81 | Ht 59.0 in | Wt 192.4 lb

## 2020-03-21 VITALS — BP 137/86 | HR 88 | Temp 98.3°F | Resp 18 | Ht 59.0 in | Wt 194.2 lb

## 2020-03-21 DIAGNOSIS — R110 Nausea: Secondary | ICD-10-CM

## 2020-03-21 DIAGNOSIS — N898 Other specified noninflammatory disorders of vagina: Secondary | ICD-10-CM | POA: Insufficient documentation

## 2020-03-21 DIAGNOSIS — R21 Rash and other nonspecific skin eruption: Secondary | ICD-10-CM

## 2020-03-21 DIAGNOSIS — K5909 Other constipation: Secondary | ICD-10-CM

## 2020-03-21 DIAGNOSIS — D509 Iron deficiency anemia, unspecified: Secondary | ICD-10-CM

## 2020-03-21 DIAGNOSIS — R300 Dysuria: Secondary | ICD-10-CM

## 2020-03-21 DIAGNOSIS — E559 Vitamin D deficiency, unspecified: Secondary | ICD-10-CM

## 2020-03-21 LAB — POCT URINALYSIS DIP (CLINITEK)
Bilirubin, UA: NEGATIVE
Blood, UA: NEGATIVE
Glucose, UA: NEGATIVE mg/dL
Ketones, POC UA: NEGATIVE mg/dL
Leukocytes, UA: NEGATIVE
Nitrite, UA: NEGATIVE
POC PROTEIN,UA: NEGATIVE
Spec Grav, UA: >=1.03 — AB (ref 1.010–1.025)
Urobilinogen, UA: 0.2 E.U./dL
pH, UA: 5.5 (ref 5.0–8.0)

## 2020-03-21 MED ORDER — MIRTAZAPINE 15 MG PO TABS: 15.0000 mg | ORAL_TABLET | Freq: Every day | ORAL | 1 refills | Status: DC

## 2020-03-21 MED ORDER — CLOTRIMAZOLE-BETAMETHASONE 1-0.05 % EX CREA: TOPICAL_CREAM | CUTANEOUS | 0 refills | Status: DC

## 2020-03-21 MED FILL — ONDANSETRON ODT 4 MG TABLET: 4 | 10 days supply | Qty: 30 | Fill #3

## 2020-03-21 MED FILL — PANTOPRAZOLE SOD DR 20 MG T: 20 | 30 days supply | Qty: 30 | Fill #2

## 2020-03-21 MED FILL — CLOTRIMAZOLE-BETAMETHASONE: 1-0.05 | 14 days supply | Qty: 30 | Fill #0

## 2020-03-21 MED FILL — MIRTAZAPINE 15 MG TABLET: 15 | 30 days supply | Qty: 30 | Fill #0

## 2020-03-21 NOTE — Patient Instructions (Addendum)
If you are age 34 or older, your body mass index should be between 23-30. Your Body mass index is 38.86 kg/m. If this is out of the aforementioned range listed, please consider follow up with your Primary Care Provider.  If you are age 28 or younger, your body mass index should be between 19-25. Your Body mass index is 38.86 kg/m. If this is out of the aformentioned range listed, please consider follow up with your Primary Care Provider.   We have sent the following medications to your pharmacy for you to pick up at your convenience: Remeron 15 mg and Loritisone .  Increase Miralax to twice daily.  Call back in two weeks with an update ask for [employer].   Follow up 04/26/20 @ 10:00 am  Thank you for choosing me and [facility].  Alonza Bogus, PA-C

## 2020-03-21 NOTE — Progress Notes (Incomplete)
  Subjective:    Sherri [name] - 35 y.o. female MRN [id number]  Date of birth: [birth year]  HPI  Sherri [name] is here for follow up.     Health Maintenance:  - *** Health Maintenance Due  Topic Date Due  . PAP SMEAR-Modifier  07/17/2017  . COVID-19 Vaccine (2 - Booster for Janssen series) 08/07/2019    -  reports that she has never smoked. She has never used smokeless tobacco. - Review of Systems: Per HPI. - Past Medical History: Patient Active Problem List   Diagnosis Date Noted  . Chronic constipation 03/09/2019  . Pelvic pain in female 03/09/2019  . Hypertension   . Genital herpes   . Status post cesarean delivery 06/06/2014  . Uterine fibroid 12/25/2013  . Obesity 12/25/2013   - Medications: reviewed and updated   Objective:   Physical Exam There were no vitals taken for this visit. Physical Exam         Assessment & Plan:   There are no diagnoses linked to this encounter.    Phill Myron, D.O. 03/21/2020, 3:39 PM Primary Care at [facility]

## 2020-03-21 NOTE — Telephone Encounter (Signed)
Erroneous encounter

## 2020-03-21 NOTE — Progress Notes (Signed)
Reports burning w/ urination, foul odor x few days

## 2020-03-21 NOTE — Progress Notes (Signed)
  Subjective:    Sherri [name] - 35 y.o. female MRN [id number]  Date of birth: November 25, 1985  HPI  Sherri [name] is here for multiple acute concerns.  Has a history of iron deficiency anemia. Taking Fe BID.   Has a history of Vit D deficiency. Taking Vit D 5000 IU daily.   Endorses dysuria and vaginal discharge. Has been occurring for a couple days. Occasional pelvic pressure. Discharge is thick, white, malodorous. Unsure about blood in urine as she just finished her menstrual cycle.    Health Maintenance:  Health Maintenance Due  Topic Date Due  . PAP SMEAR-Modifier  07/17/2017  . COVID-19 Vaccine (2 - Booster for Janssen series) 08/07/2019    -  reports that she has never smoked. She has never used smokeless tobacco. - Review of Systems: Per HPI. - Past Medical History: Patient Active Problem List   Diagnosis Date Noted  . Chronic constipation 03/09/2019  . Pelvic pain in female 03/09/2019  . Hypertension   . Genital herpes   . Status post cesarean delivery 06/06/2014  . Uterine fibroid 12/25/2013  . Obesity 12/25/2013   - Medications: reviewed and updated   Objective:   Physical Exam BP 137/86 (BP Location: Right Arm, Patient Position: Sitting, Cuff Size: Large)   Pulse 88   Temp 98.3 F (36.8 C) (Oral)   Resp 18   Ht 4\' 11"  (1.499 m)   Wt 194 lb 3.2 oz (88.1 kg)   SpO2 98%   BMI 39.22 kg/m  Physical Exam Constitutional:      General: She is not in acute distress.    Appearance: She is not diaphoretic.  Cardiovascular:     Rate and Rhythm: Normal rate.  Pulmonary:     Effort: Pulmonary effort is normal. No respiratory distress.  Musculoskeletal:        General: Normal range of motion.  Skin:    General: Skin is warm and dry.  Neurological:     Mental Status: She is alert and oriented to person, place, and time.  Psychiatric:        Mood and Affect: Affect normal.        Judgment: Judgment normal.            Assessment & Plan:   1.  Dysuria UA unremarkable aside from elevated specific gravity. Discussed increasing water intake. Will obtain culture and treat as appropriate.  - POCT URINALYSIS DIP (CLINITEK) - Urine Culture  2. Vaginal discharge - Cervicovaginal ancillary only  3. Vitamin D deficiency - Vitamin D, 25-hydroxy  4. Iron deficiency anemia, unspecified iron deficiency anemia type - CBC - Iron, TIBC and Ferritin Panel     Phill Myron, D.O. 03/21/2020, 3:58 PM Primary Care at [facility]

## 2020-03-21 NOTE — Progress Notes (Signed)
03/21/2020 Sherri [name] [id number] 08-26-1985   HISTORY OF PRESENT ILLNESS: This is a 35 year old female who is a patient of Dr. Silvio Pate.  She follows here for complaints of chronic constipatoin and chronic nausea.  In regards to the constipation she takes MiraLAX, which helps some.  She had tried Linzess in the past, but she said that that made her feel weird and tired.  She is also reporting today pain in her bottom/perianal area.  She does see some bright red blood on the toilet tissue at times.  She also reports some intermittent abdominal pain in left lower quadrant and right lower quadrants with bowel movements.  She has heartburn occasionally, but takes pantoprazole 20 mg daily which does seem to help with that.  She does still continue to complain of ongoing nausea.  It was recommended she try FD guard for dyspepsia at her last visit here in November, but she says that that made her feel sick.  She says that sometimes she wakes up in the middle the night feeling nauseous.  She has Zofran which she uses on occasion as well.  She underwent upper GI in October 2021 in lieu of EGD as she has transportation issues.  This showed a small hiatal hernia, some episodes of spontaneous GERD and normal-appearing stomach and duodenum. She also had CT of the abdomen pelvis in October 2021 which was unremarkable.  Celiac labs were negative and H. pylori stool antigen was negative in November 2021.  She is a limited historian.  Past Medical History:  Diagnosis Date  . Fibromyalgia    denies today  . Genital herpes   . Hypertension    gestational  . 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 Urinary tract infection   . Uterine fibroid    Past Surgical History:  Procedure Laterality Date  . CESAREAN SECTION N/A 06/05/2014   Procedure: CESAREAN SECTION;  Surgeon: Truett Mainland, DO;  Location: [facility];  Service: Obstetrics;  Laterality: N/A;  . WISDOM TOOTH EXTRACTION       reports that she has never smoked. She has never used smokeless tobacco. She reports that she does not drink alcohol and does not use drugs. family history includes Breast cancer in her maternal grandmother; Hypertension in her father and mother. Allergies  Allergen Reactions  . Macrobid [Nitrofurantoin] Nausea And Vomiting      Outpatient Encounter Medications as of 03/21/2020  Medication Sig  . azelastine (ASTELIN) 0.1 % nasal spray Place 2 sprays into both nostrils 2 (two) times daily.  . cyclobenzaprine (FLEXERIL) 10 MG tablet Take 1 tablet (10 mg total) by mouth 3 (three) times daily as needed for muscle spasms. DO NOT DRINK ALCOHOL OR DRIVE WHILE TAKING THIS MEDICATION  . ondansetron (ZOFRAN) 4 MG tablet Take 1 tablet (4 mg total) by mouth every 6 (six) hours as needed for nausea or vomiting.  . pantoprazole (PROTONIX) 20 MG tablet Take 1 tablet (20 mg total) by mouth daily.  . predniSONE (DELTASONE) 50 MG tablet Take 1 tablet (50 mg total) by mouth daily with breakfast.   No facility-administered encounter medications on file as of 03/21/2020.    REVIEW OF SYSTEMS  : All other systems reviewed and negative except where noted in the History of Present Illness.   PHYSICAL EXAM: BP 120/74   Pulse 81   Ht 4\' 11"  (1.499 m)   Wt 192 lb 6.4 oz (87.3 kg)   SpO2 97%  BMI 38.86 kg/m  General: Well developed AA female in no acute distress Head: Normocephalic and atraumatic Eyes:  Sclerae anicteric, conjunctiva pink. Ears: Normal auditory acuity Lungs: Clear throughout to auscultation; no W/R/R. Heart: Regular rate and rhythm; no M/R/G. Abdomen: Soft, non-distended.  BS present.  Non-tender. Rectal: There were really no perianal abnormalities. She had a rash that almost looked like it could be skin breakdown related to moisture with possibly some yeast component present that was away from the perianal area, more posterior towards the gluteal cleft. Musculoskeletal: Symmetrical  with no gross deformities  Skin: No lesions on visible extremities Extremities: No edema  Neurological: Alert oriented x 4, grossly non-focal Psychological:  Alert and cooperative. Normal mood and affect  ASSESSMENT AND PLAN: *Rash posterior to the anus: Likely possibly skin breakdown from moisture a possible yeast component. We will try clotrimazole-betamethasone to three times daily for 2 weeks. I would like her to call us back with an update at that point.  Prescription sent to pharmacy. *Chronic ongoing nausea: Says that FDgard made her sick.  Also reports intermittent lower abdominal pain at times as well as insomnia. We will try Remeron 15 mg at bedtime.  Prescription sent to pharmacy.  *Chronic constipation: Currently using MiraLAX and I would like her to increase that to twice daily.  Did not tolerate Linzess.  **She will follow up in the office in about 4 to 6 weeks. Could consider increasing the Remeron to 30 mg at bedtime if needed.   CC:  Caryl Never*

## 2020-03-22 LAB — CERVICOVAGINAL ANCILLARY ONLY
Bacterial Vaginitis (gardnerella): POSITIVE — AB
Candida Glabrata: NEGATIVE
Candida Vaginitis: NEGATIVE
Chlamydia: NEGATIVE
Comment: NEGATIVE
Comment: NEGATIVE
Comment: NEGATIVE
Comment: NEGATIVE
Comment: NEGATIVE
Comment: NORMAL
Neisseria Gonorrhea: NEGATIVE
Trichomonas: NEGATIVE

## 2020-03-23 LAB — URINE CULTURE

## 2020-03-26 ENCOUNTER — Other Ambulatory Visit: Payer: Self-pay | Admitting: Internal Medicine

## 2020-03-26 ENCOUNTER — Encounter: Payer: Self-pay | Admitting: Gastroenterology

## 2020-03-26 DIAGNOSIS — R110 Nausea: Secondary | ICD-10-CM | POA: Insufficient documentation

## 2020-03-26 DIAGNOSIS — R21 Rash and other nonspecific skin eruption: Secondary | ICD-10-CM | POA: Insufficient documentation

## 2020-03-26 MED ORDER — METRONIDAZOLE 0.75 % VA GEL: 1.0000 | Freq: Two times a day (BID) | VAGINAL | 0 refills | Status: DC

## 2020-03-26 MED ORDER — METRONIDAZOLE 500 MG PO TABS: 500.0000 mg | ORAL_TABLET | Freq: Two times a day (BID) | ORAL | 0 refills | Status: DC

## 2020-03-26 MED FILL — METRONIDAZOLE 0.75 % GEL: 0.75 | 3 days supply | Qty: 70 | Fill #0

## 2020-03-26 MED FILL — metroNIDAZOLE 500 MG TABS: 500 | 7 days supply | Qty: 14 | Fill #0

## 2020-03-26 NOTE — Progress Notes (Signed)
Reviewed and agree with management plan.  Duchess Armendarez T. Hanifa Antonetti, MD FACG [phone]  

## 2020-03-27 ENCOUNTER — Other Ambulatory Visit: Payer: Self-pay

## 2020-03-27 DIAGNOSIS — D509 Iron deficiency anemia, unspecified: Secondary | ICD-10-CM

## 2020-03-27 DIAGNOSIS — E559 Vitamin D deficiency, unspecified: Secondary | ICD-10-CM

## 2020-03-28 LAB — IRON,TIBC AND FERRITIN PANEL
Ferritin: 33 ng/mL (ref 15–150)
Iron Saturation: 11 % — ABNORMAL LOW (ref 15–55)
Iron: 43 ug/dL (ref 27–159)
Total Iron Binding Capacity: 376 ug/dL (ref 250–450)
UIBC: 333 ug/dL (ref 131–425)

## 2020-03-28 LAB — VITAMIN D 25 HYDROXY (VIT D DEFICIENCY, FRACTURES): Vit D, 25-Hydroxy: 39.1 ng/mL (ref 30.0–100.0)

## 2020-04-02 ENCOUNTER — Telehealth: Payer: Self-pay | Admitting: Gastroenterology

## 2020-04-02 ENCOUNTER — Other Ambulatory Visit: Payer: Self-pay

## 2020-04-02 NOTE — Telephone Encounter (Signed)
Pt is wanting to inform the nurse that the LOTRISONE cream that was prescribed is working for her. Pt is requesting a refill if possible.  [employer]

## 2020-04-04 ENCOUNTER — Other Ambulatory Visit: Payer: Self-pay

## 2020-04-04 MED ORDER — CLOTRIMAZOLE-BETAMETHASONE 1-0.05 % EX CREA: TOPICAL_CREAM | CUTANEOUS | 0 refills | Status: DC

## 2020-04-04 MED FILL — ONDANSETRON ODT 4 MG TABLET: 4 | 10 days supply | Qty: 30 | Fill #4

## 2020-04-04 MED FILL — CLOTRIMAZOLE-BETAMETHASONE: 1-0.05 | 14 days supply | Qty: 30 | Fill #0

## 2020-04-10 ENCOUNTER — Telehealth: Payer: Self-pay | Admitting: Internal Medicine

## 2020-04-10 NOTE — Telephone Encounter (Signed)
Pt is calling to hear back from past lab results when possible. Please advise and thank you.

## 2020-04-10 NOTE — Telephone Encounter (Signed)
Iron and Vit D levels are normal. Continue current dose of supplements.   Phill Myron, D.O. Primary Care at [facility]  04/10/2020, 4:57 PM

## 2020-04-10 NOTE — Telephone Encounter (Signed)
Pt aware of results and voiced understanding, all questions answered/concerns addressed. Pt has concerns that she still has severe fatigue and is asking if there are any other tests/labs that you would recommend that she have completed, pls advise

## 2020-04-10 NOTE — Telephone Encounter (Signed)
Pls review last set of labs from 03/26/20 and advise, thanks

## 2020-04-11 NOTE — Telephone Encounter (Signed)
Patient has had a fairly extensive work up over the past few months and unfortunately, I do not see any missing tests that I would recommend.   Phill Myron, D.O. Primary Care at [facility]  04/11/2020, 3:13 PM

## 2020-04-19 ENCOUNTER — Other Ambulatory Visit: Payer: Self-pay | Admitting: Physician Assistant

## 2020-04-19 MED FILL — METRONIDAZOLE 0.75 % GEL: 0.75 | 5 days supply | Qty: 70 | Fill #0

## 2020-04-19 MED FILL — ONDANSETRON ODT 4 MG TABLET: 4 | 10 days supply | Qty: 30 | Fill #4

## 2020-04-19 MED FILL — MIRTAZAPINE 15 MG TABLET: 15 | 30 days supply | Qty: 30 | Fill #1

## 2020-04-21 ENCOUNTER — Encounter (HOSPITAL_COMMUNITY): Payer: Self-pay

## 2020-04-21 ENCOUNTER — Other Ambulatory Visit: Payer: Self-pay

## 2020-04-21 ENCOUNTER — Ambulatory Visit (HOSPITAL_COMMUNITY)
Admission: EM | Admit: 2020-04-21 | Discharge: 2020-04-21 | Disposition: A | Discharge status: Home / Self Care | Payer: Self-pay | Attending: Emergency Medicine | Admitting: Emergency Medicine

## 2020-04-21 DIAGNOSIS — R079 Chest pain, unspecified: Secondary | ICD-10-CM

## 2020-04-21 DIAGNOSIS — R1084 Generalized abdominal pain: Secondary | ICD-10-CM

## 2020-04-21 DIAGNOSIS — Z881 Allergy status to other antibiotic agents status: Secondary | ICD-10-CM | POA: Insufficient documentation

## 2020-04-21 DIAGNOSIS — Z79899 Other long term (current) drug therapy: Secondary | ICD-10-CM | POA: Insufficient documentation

## 2020-04-21 DIAGNOSIS — R112 Nausea with vomiting, unspecified: Secondary | ICD-10-CM

## 2020-04-21 DIAGNOSIS — R197 Diarrhea, unspecified: Secondary | ICD-10-CM | POA: Insufficient documentation

## 2020-04-21 DIAGNOSIS — Z20822 Contact with and (suspected) exposure to covid-19: Secondary | ICD-10-CM | POA: Insufficient documentation

## 2020-04-21 DIAGNOSIS — B349 Viral infection, unspecified: Secondary | ICD-10-CM

## 2020-04-21 DIAGNOSIS — Z3202 Encounter for pregnancy test, result negative: Secondary | ICD-10-CM

## 2020-04-21 DIAGNOSIS — R0789 Other chest pain: Secondary | ICD-10-CM

## 2020-04-21 DIAGNOSIS — M549 Dorsalgia, unspecified: Secondary | ICD-10-CM

## 2020-04-21 DIAGNOSIS — R52 Pain, unspecified: Secondary | ICD-10-CM

## 2020-04-21 LAB — POCT URINALYSIS DIPSTICK, ED / UC
Bilirubin Urine: NEGATIVE
Glucose, UA: NEGATIVE mg/dL
Hgb urine dipstick: NEGATIVE
Ketones, ur: NEGATIVE mg/dL
Leukocytes,Ua: NEGATIVE
Nitrite: NEGATIVE
Protein, ur: NEGATIVE mg/dL
Specific Gravity, Urine: 1.015 (ref 1.005–1.030)
Urobilinogen, UA: 0.2 mg/dL (ref 0.0–1.0)
pH: 5.5 (ref 5.0–8.0)

## 2020-04-21 LAB — SARS CORONAVIRUS 2 (TAT 6-24 HRS): SARS Coronavirus 2: NEGATIVE

## 2020-04-21 LAB — POC URINE PREG, ED: Preg Test, Ur: NEGATIVE

## 2020-04-21 MED ORDER — ONDANSETRON 8 MG PO TBDP: 8.0000 mg | ORAL_TABLET | Freq: Three times a day (TID) | ORAL | 0 refills | Status: DC | PRN

## 2020-04-21 MED ORDER — LOPERAMIDE HCL 2 MG PO CAPS: 2.0000 mg | ORAL_CAPSULE | Freq: Two times a day (BID) | ORAL | 0 refills | Status: DC | PRN

## 2020-04-21 NOTE — ED Triage Notes (Signed)
Pt reports having abdominal, back and chest pain with nausea and diarrhea for past 3 days

## 2020-04-21 NOTE — Discharge Instructions (Signed)
We will notify you of your COVID-19 test results as they arrive and may take between 24 to 48 hours.  I encourage you to sign up for MyChart if you have not already done so as this can be the easiest way for [place] to communicate results to you online or through a phone app.  In the meantime, if you develop worsening symptoms including fever, chest pain, shortness of breath despite our current treatment plan then please report to the emergency room as this may be a sign of worsening status from possible COVID-19 infection.  Otherwise, we will manage this as a viral syndrome. For sore throat or cough try using a honey-based tea. Use 3 teaspoons of honey with juice squeezed from half lemon. Place shaved pieces of ginger into 1/2-1 cup of water and warm over stove top. Then mix the ingredients and repeat every 4 hours as needed. Please take Tylenol 500mg -650mg  every 6 hours for aches and pains, fevers. Hydrate very well with at least 2 liters of water. Eat light meals such as soups to replenish electrolytes and soft fruits, veggies. Start an antihistamine like Zyrtec, Allegra or Claritin for postnasal drainage, sinus congestion.  You can take this together with pseudoephedrine (Sudafed) at a dose of 60 mg 2-3 times a day as needed for the same kind of congestion.  Make sure you push fluids drinking mostly water but mix it with Gatorade.  Try to eat light meals including soups, broths and soft foods, fruits.  You may use Zofran for your nausea and vomiting once every 8 hours.  Imodium can help with diarrhea but use this carefully limiting it to 1-2 times per day only if you are having a lot of diarrhea.

## 2020-04-21 NOTE — ED Provider Notes (Signed)
[facility]   MRN: [id number] DOB: [birth year]  Subjective:   Sherri [name] is a 35 y.o. female presenting for 3-day history of acute onset nausea, vomiting, diarrhea, back pain, belly pain, chest pain.  Patient has had both Covid vaccinations but not the booster.  Denies fever, headache, runny or stuffy nose, sore throat, cough, shortness of breath, loss of taste and smell.  Patient states that the only thing that she can think of is that she ate at the lounge at her job recently and this may have been a factor with her GI symptoms.  He denies any recent antibiotic use, hospitalizations.  No recent long distance travel.  No bloody stools.  She has vomited 2-3 times a day, 4-5 loose stools a day.  Has not tried medications for relief.  She does have allergies and is consistent with her allergy medications daily.  She is not a smoker.  No history of lung disorders.  No current facility-administered medications for this encounter.  Current Outpatient Medications:  .  metroNIDAZOLE (FLAGYL) 500 MG tablet, Take 1 tablet (500 mg total) by mouth 2 (two) times daily., Disp: 14 tablet, Rfl: 0 .  azelastine (ASTELIN) 0.1 % nasal spray, Place 2 sprays into both nostrils 2 (two) times daily., Disp: 30 mL, Rfl: 0 .  Cholecalciferol (VITAMIN D3) 125 MCG (5000 UT) CAPS, Take 1 capsule by mouth daily., Disp: , Rfl:  .  clotrimazole-betamethasone (LOTRISONE) cream, Apply to affected area 2-3 times daily x 2 weeks., Disp: 30 g, Rfl: 0 .  cyclobenzaprine (FLEXERIL) 10 MG tablet, Take 1 tablet (10 mg total) by mouth 3 (three) times daily as needed for muscle spasms. DO NOT DRINK ALCOHOL OR DRIVE WHILE TAKING THIS MEDICATION, Disp: 15 tablet, Rfl: 0 .  Ferrous Sulfate (IRON) 28 MG TABS, Take 1 tablet by mouth daily., Disp: , Rfl:  .  mirtazapine (REMERON) 15 MG tablet, Take 1 tablet (15 mg total) by mouth at bedtime., Disp: 30 tablet, Rfl: 1 .  pantoprazole (PROTONIX) 20 MG tablet, Take 1 tablet  (20 mg total) by mouth daily., Disp: 30 tablet, Rfl: 2   Allergies  Allergen Reactions  . Macrobid [Nitrofurantoin] Nausea And Vomiting    Past Medical History:  Diagnosis Date  . Fibromyalgia    denies today  . Genital herpes   . Hypertension    gestational  . 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 Urinary tract infection   . Uterine fibroid      Past Surgical History:  Procedure Laterality Date  . CESAREAN SECTION N/A 06/05/2014   Procedure: CESAREAN SECTION;  Surgeon: Truett Mainland, DO;  Location: [facility];  Service: Obstetrics;  Laterality: N/A;  . WISDOM TOOTH EXTRACTION      Family History  Problem Relation Age of Onset  . Hypertension Mother   . Hypertension Father   . Breast cancer Maternal Grandmother        42  . Colon cancer Neg Hx   . Colon polyps Neg Hx   . Kidney disease Neg Hx   . Diabetes Neg Hx   . Esophageal cancer Neg Hx   . Gallbladder disease Neg Hx   . Heart disease Neg Hx   . Asthma Neg Hx   . Stroke Neg Hx   . Stomach cancer Neg Hx     Social History   Tobacco Use  . Smoking status: Never Smoker  . Smokeless tobacco: Never Used  Vaping Use  . Vaping Use: Never used  Substance Use Topics  . Alcohol use: No  . Drug use: No    ROS   Objective:   Vitals: BP 127/77   Pulse 86   Temp 98.5 F (36.9 C)   Resp 20   LMP 04/15/2020   SpO2 99%   Physical Exam Constitutional:      General: She is not in acute distress.    Appearance: Normal appearance. She is well-developed and normal weight. She is not ill-appearing, toxic-appearing or diaphoretic.  HENT:     Head: Normocephalic and atraumatic.     Right Ear: External ear normal.     Left Ear: External ear normal.     Nose: Nose normal.     Mouth/Throat:     Mouth: Mucous membranes are moist.     Pharynx: Oropharynx is clear.  Eyes:     General: No scleral icterus.    Extraocular Movements: Extraocular movements intact.     Pupils: Pupils are equal,  round, and reactive to light.  Cardiovascular:     Rate and Rhythm: Normal rate and regular rhythm.     Heart sounds: Normal heart sounds. No murmur heard. No friction rub. No gallop.   Pulmonary:     Effort: Pulmonary effort is normal. No respiratory distress.     Breath sounds: Normal breath sounds. No stridor. No wheezing, rhonchi or rales.  Abdominal:     General: Bowel sounds are normal. There is no distension.     Palpations: Abdomen is soft. There is no mass.     Tenderness: There is generalized abdominal tenderness (mild). There is no right CVA tenderness, left CVA tenderness, guarding or rebound.  Skin:    General: Skin is warm and dry.     Coloration: Skin is not pale.     Findings: No rash.  Neurological:     General: No focal deficit present.     Mental Status: She is alert and oriented to person, place, and time.  Psychiatric:        Mood and Affect: Mood normal.        Behavior: Behavior normal.        Thought Content: Thought content normal.        Judgment: Judgment normal.     Results for orders placed or performed during the hospital encounter of 04/21/20 (from the past 24 hour(s))  POC Urinalysis dipstick     Status: None   Collection Time: 04/21/20 11:14 AM  Result Value Ref Range   Glucose, UA NEGATIVE NEGATIVE mg/dL   Bilirubin Urine NEGATIVE NEGATIVE   Ketones, ur NEGATIVE NEGATIVE mg/dL   Specific Gravity, Urine 1.015 1.005 - 1.030   Hgb urine dipstick NEGATIVE NEGATIVE   pH 5.5 5.0 - 8.0   Protein, ur NEGATIVE NEGATIVE mg/dL   Urobilinogen, UA 0.2 0.0 - 1.0 mg/dL   Nitrite NEGATIVE NEGATIVE   Leukocytes,Ua NEGATIVE NEGATIVE  POC urine pregnancy     Status: None   Collection Time: 04/21/20 11:23 AM  Result Value Ref Range   Preg Test, Ur NEGATIVE NEGATIVE    Assessment and Plan :   PDMP not reviewed this encounter.  1. Viral syndrome   2. Nausea vomiting and diarrhea   3. Generalized abdominal pain   4. Atypical chest pain   5. Body aches      Patient has tenderness on light palpation of her chest and thoracic back on either side.  She also  has generalized mild abdominal tenderness on light palpation.  Discussed that this is likely body aches as opposed to an acute cardiopulmonary or abdominal emergency.  Will manage for viral illness such as viral URI, viral syndrome, viral rhinitis, COVID-19, viral gastroenteritis. Counseled patient on nature of COVID-19 including modes of transmission, diagnostic testing, management and supportive care.  Offered scripts for symptomatic relief. COVID 19 testing is pending.  Emphasized need to push fluids.  Counseled patient on potential for adverse effects with medications prescribed/recommended today, ER and return-to-clinic precautions discussed, patient verbalized understanding.     Jaynee Eagles, PA-C 04/21/20 1134

## 2020-04-22 ENCOUNTER — Emergency Department (HOSPITAL_COMMUNITY): Payer: Self-pay

## 2020-04-22 ENCOUNTER — Encounter (HOSPITAL_COMMUNITY): Payer: Self-pay

## 2020-04-22 ENCOUNTER — Other Ambulatory Visit (HOSPITAL_COMMUNITY): Payer: Self-pay | Admitting: Emergency Medicine

## 2020-04-22 ENCOUNTER — Emergency Department (HOSPITAL_COMMUNITY)
Admission: EM | Admit: 2020-04-22 | Discharge: 2020-04-22 | Disposition: A | Discharge status: Home / Self Care | Payer: Self-pay | Attending: Emergency Medicine | Admitting: Emergency Medicine

## 2020-04-22 ENCOUNTER — Other Ambulatory Visit: Payer: Self-pay

## 2020-04-22 DIAGNOSIS — R197 Diarrhea, unspecified: Secondary | ICD-10-CM | POA: Insufficient documentation

## 2020-04-22 DIAGNOSIS — R1084 Generalized abdominal pain: Secondary | ICD-10-CM | POA: Insufficient documentation

## 2020-04-22 DIAGNOSIS — M791 Myalgia, unspecified site: Secondary | ICD-10-CM | POA: Insufficient documentation

## 2020-04-22 DIAGNOSIS — R112 Nausea with vomiting, unspecified: Secondary | ICD-10-CM | POA: Insufficient documentation

## 2020-04-22 DIAGNOSIS — R079 Chest pain, unspecified: Secondary | ICD-10-CM | POA: Insufficient documentation

## 2020-04-22 LAB — BASIC METABOLIC PANEL
Anion gap: 9 (ref 5–15)
BUN: 10 mg/dL (ref 6–20)
CO2: 25 mmol/L (ref 22–32)
Calcium: 9.3 mg/dL (ref 8.9–10.3)
Chloride: 105 mmol/L (ref 98–111)
Creatinine, Ser: 0.91 mg/dL (ref 0.44–1.00)
GFR, Estimated: >60 mL/min (ref >60)
Glucose, Bld: 96 mg/dL (ref 70–99)
Potassium: 3.4 mmol/L — ABNORMAL LOW (ref 3.5–5.1)
Sodium: 139 mmol/L (ref 135–145)

## 2020-04-22 LAB — HEPATIC FUNCTION PANEL
ALT: 32 U/L (ref 0–44)
AST: 30 U/L (ref 15–41)
Albumin: 4 g/dL (ref 3.5–5.0)
Alkaline Phosphatase: 67 U/L (ref 38–126)
Bilirubin, Direct: <0.1 mg/dL (ref 0.0–0.2)
Total Bilirubin: 0.6 mg/dL (ref 0.3–1.2)
Total Protein: 7.4 g/dL (ref 6.5–8.1)

## 2020-04-22 LAB — TROPONIN I (HIGH SENSITIVITY)
Troponin I (High Sensitivity): 4 ng/L (ref <18)
Troponin I (High Sensitivity): 5 ng/L (ref <18)

## 2020-04-22 LAB — LIPASE, BLOOD: Lipase: 29 U/L (ref 11–51)

## 2020-04-22 LAB — CBC
HCT: 40.8 % (ref 36.0–46.0)
Hemoglobin: 13 g/dL (ref 12.0–15.0)
MCH: 28.3 pg (ref 26.0–34.0)
MCHC: 31.9 g/dL (ref 30.0–36.0)
MCV: 88.7 fL (ref 80.0–100.0)
Platelets: 257 10*3/uL (ref 150–400)
RBC: 4.6 MIL/uL (ref 3.87–5.11)
RDW: 16.6 % — ABNORMAL HIGH (ref 11.5–15.5)
WBC: 6.1 10*3/uL (ref 4.0–10.5)
nRBC: 0 % (ref 0.0–0.2)

## 2020-04-22 LAB — I-STAT BETA HCG BLOOD, ED (NOT ORDERABLE): I-stat hCG, quantitative: <5 m[IU]/mL (ref <5)

## 2020-04-22 MED ORDER — ONDANSETRON HCL 4 MG/2ML IJ SOLN
4.0000 mg | Freq: Once | INTRAMUSCULAR | Status: AC
  Administered 2020-04-22: 4 mg via INTRAVENOUS
  Filled 2020-04-22: qty 2

## 2020-04-22 MED ORDER — IOHEXOL 300 MG/ML  SOLN
100.0000 mL | Freq: Once | INTRAMUSCULAR | Status: AC | PRN
  Administered 2020-04-22: 100 mL via INTRAVENOUS

## 2020-04-22 MED ORDER — SODIUM CHLORIDE 0.9 % IV BOLUS
500.0000 mL | Freq: Once | INTRAVENOUS | Status: AC
  Administered 2020-04-22: 500 mL via INTRAVENOUS

## 2020-04-22 MED ORDER — DICYCLOMINE HCL 20 MG PO TABS: 20.0000 mg | ORAL_TABLET | Freq: Three times a day (TID) | ORAL | 0 refills | Status: DC | PRN

## 2020-04-22 NOTE — ED Notes (Signed)
PO fluids and sandwich given to patient as requested.

## 2020-04-22 NOTE — ED Provider Notes (Signed)
[facility] DEPT Provider Note   CSN: [id number] Arrival date & time: 04/22/20  1216     History Chief Complaint  Patient presents with  . Chest Pain    Sherri [name] is a 35 y.o. female.  HPI Patient presents with chest and abdominal pain. Has had for around 4 days now. Pain is diffuse in the abdomen. Has had some nausea vomiting diarrhea. States the diarrhea is been both green and purple. States the pain is diffuse. Seen in urgent care yesterday and had urine Covid tests. Both which were negative. States continued pain. States she hurts all over and hurting her lower back. Pain is dull. Pain is not different after the diarrhea. No blood in the stool. No known sick contacts.    Past Medical History:  Diagnosis Date  . Fibromyalgia    denies today  . Genital herpes   . Hypertension    gestational  . 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 Urinary tract infection   . Uterine fibroid     Patient Active Problem List   Diagnosis Date Noted  . Perianal rash 03/26/2020  . Chronic nausea 03/26/2020  . Chronic constipation 03/09/2019  . Pelvic pain in female 03/09/2019  . Hypertension   . Genital herpes   . Status post cesarean delivery 06/06/2014  . Uterine fibroid 12/25/2013  . Obesity 12/25/2013    Past Surgical History:  Procedure Laterality Date  . CESAREAN SECTION N/A 06/05/2014   Procedure: CESAREAN SECTION;  Surgeon: Truett Mainland, DO;  Location: [facility];  Service: Obstetrics;  Laterality: N/A;  . WISDOM TOOTH EXTRACTION       OB History    Gravida  1   Para  1   Term      Preterm  1   AB      Living  2     SAB      IAB      Ectopic      Multiple  1   Live Births  2           Family History  Problem Relation Age of Onset  . Hypertension Mother   . Hypertension Father   . Breast cancer Maternal Grandmother        37  . Colon cancer Neg Hx   . Colon polyps Neg Hx   . Kidney  disease Neg Hx   . Diabetes Neg Hx   . Esophageal cancer Neg Hx   . Gallbladder disease Neg Hx   . Heart disease Neg Hx   . Asthma Neg Hx   . Stroke Neg Hx   . Stomach cancer Neg Hx     Social History   Tobacco Use  . Smoking status: Never Smoker  . Smokeless tobacco: Never Used  Vaping Use  . Vaping Use: Never used  Substance Use Topics  . Alcohol use: No  . Drug use: No    Home Medications Prior to Admission medications   Medication Sig Start Date End Date Taking? Authorizing Provider  dicyclomine (BENTYL) 20 MG tablet Take 1 tablet (20 mg total) by mouth 3 (three) times daily as needed for spasms. 04/22/20  Yes Davonna Belling, MD  metroNIDAZOLE (FLAGYL) 500 MG tablet Take 1 tablet (500 mg total) by mouth 2 (two) times daily. 03/26/20   Nicolette Bang, DO  azelastine (ASTELIN) 0.1 % nasal spray Place 2 sprays into both nostrils 2 (two) times daily. 02/10/20  Tasia Catchings, Amy V, PA-C  Cholecalciferol (VITAMIN D3) 125 MCG (5000 UT) CAPS Take 1 capsule by mouth daily.    [provider]  clotrimazole-betamethasone (LOTRISONE) cream Apply to affected area 2-3 times daily x 2 weeks. 04/04/20   Zehr, Laban Emperor, PA-C  cyclobenzaprine (FLEXERIL) 10 MG tablet Take 1 tablet (10 mg total) by mouth 3 (three) times daily as needed for muscle spasms. DO NOT DRINK ALCOHOL OR DRIVE WHILE TAKING THIS MEDICATION 02/21/20   Volney American, PA-C  Ferrous Sulfate (IRON) 28 MG TABS Take 1 tablet by mouth daily.    [provider]  loperamide (IMODIUM) 2 MG capsule Take 1 capsule (2 mg total) by mouth 2 (two) times daily as needed for diarrhea or loose stools. 04/21/20   Jaynee Eagles, PA-C  mirtazapine (REMERON) 15 MG tablet Take 1 tablet (15 mg total) by mouth at bedtime. 03/21/20   Zehr, Laban Emperor, PA-C  ondansetron (ZOFRAN-ODT) 8 MG disintegrating tablet Take 1 tablet (8 mg total) by mouth every 8 (eight) hours as needed for nausea or vomiting. 04/21/20   Jaynee Eagles, PA-C   pantoprazole (PROTONIX) 20 MG tablet Take 1 tablet (20 mg total) by mouth daily. 12/20/19   Nicolette Bang, DO    Allergies    Macrobid [nitrofurantoin]  Review of Systems   Review of Systems  Constitutional: Positive for fatigue. Negative for appetite change and fever.  HENT: Negative for dental problem.   Respiratory: Negative for shortness of breath.   Cardiovascular: Positive for chest pain.  Gastrointestinal: Positive for abdominal pain, diarrhea, nausea and vomiting. Negative for anal bleeding.  Genitourinary: Negative for dysuria.  Musculoskeletal: Positive for back pain and myalgias.  Neurological: Negative for syncope and weakness.  Psychiatric/Behavioral: Negative for confusion.    Physical Exam Updated Vital Signs BP 124/72   Pulse 82   Temp 99.6 F (37.6 C) (Oral)   Resp 18   Ht 4\' 11"  (1.499 m)   Wt 86.2 kg   LMP 04/15/2020   SpO2 99%   BMI 38.38 kg/m   Physical Exam Vitals and nursing note reviewed.  HENT:     Head: Normocephalic and atraumatic.  Cardiovascular:     Rate and Rhythm: Normal rate and regular rhythm.     Heart sounds: No murmur heard.   Pulmonary:     Breath sounds: No wheezing or rhonchi.  Abdominal:     Tenderness: There is abdominal tenderness.     Comments: Diffuse tenderness without rebound or guarding.  No hernia palpated.  Musculoskeletal:     Cervical back: Neck supple.     Right lower leg: No tenderness. No edema.  Skin:    General: Skin is warm.     Capillary Refill: Capillary refill takes less than 2 seconds.  Neurological:     Mental Status: She is alert and oriented to person, place, and time.     ED Results / Procedures / Treatments   Labs (all labs ordered are listed, but only abnormal results are displayed) Labs Reviewed  BASIC METABOLIC PANEL - Abnormal; Notable for the following components:      Result Value   Potassium 3.4 (*)    All other components within normal limits  CBC - Abnormal;  Notable for the following components:   RDW 16.6 (*)    All other components within normal limits  HEPATIC FUNCTION PANEL  LIPASE, BLOOD  I-STAT BETA HCG BLOOD, ED ([facility], [facility], [facility] ONLY)  I-STAT BETA HCG BLOOD, ED (  NOT ORDERABLE)  TROPONIN I (HIGH SENSITIVITY)  TROPONIN I (HIGH SENSITIVITY)    EKG EKG Interpretation  Date/Time:  Monday April 22 2020 12:33:40 EST Ventricular Rate:  95 PR Interval:    QRS Duration: 79 QT Interval:  345 QTC Calculation: 434 R Axis:   17 Text Interpretation: Sinus rhythm Baseline wander in lead(s) V3 V4 V5 12 Lead; Mason-Likar Confirmed by Davonna Belling [phone]) on 04/22/2020 5:32:40 PM   Radiology DG Chest 2 View  Result Date: 04/22/2020 CLINICAL DATA:  Chest pain EXAM: CHEST - 2 VIEW COMPARISON:  12/18/2019 FINDINGS: The heart size and mediastinal contours are within normal limits. Both lungs are clear. The visualized skeletal structures are unremarkable. IMPRESSION: No active cardiopulmonary disease. Electronically Signed   By: Franchot Gallo M.D.   On: 04/22/2020 13:01   CT ABDOMEN PELVIS W CONTRAST  Result Date: 04/22/2020 CLINICAL DATA:  35 year old female with history of acute onset of nonlocalized abdominal pain and nausea for the past 4 days. EXAM: CT ABDOMEN AND PELVIS WITH CONTRAST TECHNIQUE: Multidetector CT imaging of the abdomen and pelvis was performed using the standard protocol following bolus administration of intravenous contrast. CONTRAST:  144mL OMNIPAQUE IOHEXOL 300 MG/ML  SOLN COMPARISON:  CT the abdomen and pelvis 12/18/2019. FINDINGS: Lower chest: Unremarkable. Hepatobiliary: No suspicious cystic or solid hepatic lesions. No intra or extrahepatic biliary ductal dilatation. Gallbladder is normal in appearance. Pancreas: No pancreatic mass. No pancreatic ductal dilatation. No pancreatic or peripancreatic fluid collections or inflammatory changes. Spleen: Unremarkable. Adrenals/Urinary Tract: Bilateral kidneys and bilateral adrenal  glands are normal in appearance. No hydroureteronephrosis. Urinary bladder is normal in appearance. Stomach/Bowel: Normal appearance of the stomach. No pathologic dilatation of small bowel or colon. Normal appendix. Vascular/Lymphatic: No significant atherosclerotic disease, aneurysm or dissection noted in the abdominal or pelvic vasculature. No lymphadenopathy noted in the abdomen or pelvis. Reproductive: Uterus and ovaries are unremarkable in appearance. Other: No significant volume of ascites.  No pneumoperitoneum. Musculoskeletal: There are no aggressive appearing lytic or blastic lesions noted in the visualized portions of the skeleton. IMPRESSION: 1. No acute findings are noted in the abdomen or pelvis to account for the patient's symptoms. Electronically Signed   By: Vinnie Langton M.D.   On: 04/22/2020 19:48    Procedures Procedures   Medications Ordered in ED Medications  sodium chloride 0.9 % bolus 500 mL (0 mLs Intravenous Stopped 04/22/20 2052)  ondansetron (ZOFRAN) injection 4 mg (4 mg Intravenous Given 04/22/20 1821)  iohexol (OMNIPAQUE) 300 MG/ML solution 100 mL (100 mLs Intravenous Contrast Given 04/22/20 1900)    ED Course  I have reviewed the triage vital signs and the nursing notes.  Pertinent labs & imaging results that were available during my care of the patient were reviewed by me and considered in my medical decision making (see chart for details).    MDM Rules/Calculators/A&P                          Patient presents with nausea vomiting diarrhea abdominal pain.  Also chest pain back pain.  Recently seen in urgent care for the same.  Negative Covid test negative urine pregnancy and urinalysis at that time.  Continued symptoms.  States she feels bad and hurts all over.  Due to amount of tenderness CT scan done of the abdomen is reassuring.  Chest pain reassuring.  Doubt pulmonary embolism.  Doubt cardiac cause.  Doubt Boerhaave's esophagus.  Will discharge home with  symptomatic treatment.  Got Zofran prescription yesterday.  Will add on some Bentyl.  Appears to have follow-up in 4 days with gastroenterology.  Discharge home. Final Clinical Impression(s) / ED Diagnoses Final diagnoses:  Myalgia  Nausea vomiting and diarrhea    Rx / DC Orders ED Discharge Orders         Ordered    dicyclomine (BENTYL) 20 MG tablet  3 times daily PRN        04/22/20 2058           Davonna Belling, MD 04/22/20 2104

## 2020-04-22 NOTE — ED Notes (Signed)
An After Visit Summary was printed and given to the patient. Discharge instructions given and no further questions at this time.  

## 2020-04-22 NOTE — ED Triage Notes (Signed)
Pt arrived via walk in, c/o chest pain and back pain x3 days. Non reproducible, non radiating.

## 2020-04-23 ENCOUNTER — Telehealth: Payer: Self-pay | Admitting: *Deleted

## 2020-04-23 MED FILL — DICYCLOMINE 20 MG TABLET: 20 | 3 days supply | Qty: 10 | Fill #0

## 2020-04-23 NOTE — Telephone Encounter (Signed)
Transition Care Management Follow-up Telephone Call  Date of discharge and from where: 04/22/20 - [facility]  How have you been since you were released from the hospital? "slightly improved"  Any questions or concerns? No  Items Reviewed:  Did the pt receive and understand the discharge instructions provided? Yes   Medications obtained and verified? Yes   Other? N/A  Any new allergies since your discharge? No   Dietary orders reviewed? Yes  Do you have support at home? Yes   Home Care and Equipment/Supplies: Were home health services ordered? not applicable If so, what is the name of the agency? N/A  Has the agency set up a time to come to the patient's home? not applicable Were any new equipment or medical supplies ordered?  No What is the name of the medical supply agency? N/A Were you able to get the supplies/equipment? not applicable Do you have any questions related to the use of the equipment or supplies? No  Functional Questionnaire: (I = Independent and D = Dependent) ADLs: I  Bathing/Dressing- I  Meal Prep- I  Eating- I  Maintaining continence- I  Transferring/Ambulation- I  Managing Meds- I  Follow up appointments reviewed:   PCP Hospital f/u appt confirmed? No   Specialist Hospital f/u appt confirmed? No    Are transportation arrangements needed? N/A  If their condition worsens, is the pt aware to call PCP or go to the Emergency Dept.? Yes  Was the patient provided with contact information for the PCP's office or ED? Yes  Was to pt encouraged to call back with questions or concerns? Yes

## 2020-04-26 ENCOUNTER — Ambulatory Visit (HOSPITAL_COMMUNITY)
Admission: EM | Admit: 2020-04-26 | Discharge: 2020-04-26 | Disposition: A | Discharge status: Home / Self Care | Payer: Self-pay | Attending: Emergency Medicine | Admitting: Emergency Medicine

## 2020-04-26 ENCOUNTER — Other Ambulatory Visit (HOSPITAL_COMMUNITY): Payer: Self-pay | Admitting: Emergency Medicine

## 2020-04-26 ENCOUNTER — Encounter (HOSPITAL_COMMUNITY): Payer: Self-pay

## 2020-04-26 ENCOUNTER — Other Ambulatory Visit: Payer: Self-pay

## 2020-04-26 ENCOUNTER — Ambulatory Visit (INDEPENDENT_AMBULATORY_CARE_PROVIDER_SITE_OTHER): Payer: Self-pay | Admitting: Gastroenterology

## 2020-04-26 ENCOUNTER — Encounter: Payer: Self-pay | Admitting: Gastroenterology

## 2020-04-26 VITALS — BP 130/70 | HR 96 | Ht 58.66 in | Wt 196.2 lb

## 2020-04-26 DIAGNOSIS — R110 Nausea: Secondary | ICD-10-CM

## 2020-04-26 DIAGNOSIS — K5909 Other constipation: Secondary | ICD-10-CM

## 2020-04-26 DIAGNOSIS — N898 Other specified noninflammatory disorders of vagina: Secondary | ICD-10-CM

## 2020-04-26 DIAGNOSIS — K219 Gastro-esophageal reflux disease without esophagitis: Secondary | ICD-10-CM

## 2020-04-26 DIAGNOSIS — R300 Dysuria: Secondary | ICD-10-CM

## 2020-04-26 DIAGNOSIS — M791 Myalgia, unspecified site: Secondary | ICD-10-CM

## 2020-04-26 DIAGNOSIS — H00014 Hordeolum externum left upper eyelid: Secondary | ICD-10-CM

## 2020-04-26 LAB — POC INFLUENZA A AND B ANTIGEN (URGENT CARE ONLY)
Influenza A Ag: NEGATIVE
Influenza B Ag: NEGATIVE

## 2020-04-26 MED ORDER — CYCLOBENZAPRINE HCL 10 MG PO TABS: 10.0000 mg | ORAL_TABLET | Freq: Every day | ORAL | 0 refills | Status: DC

## 2020-04-26 MED ORDER — ERYTHROMYCIN 5 MG/GM OP OINT
TOPICAL_OINTMENT | OPHTHALMIC | 0 refills | Status: DC
Start: 2020-04-26 — End: 2020-04-26

## 2020-04-26 MED ORDER — PANTOPRAZOLE SODIUM 40 MG PO TBEC: 40.0000 mg | DELAYED_RELEASE_TABLET | Freq: Every day | ORAL | 5 refills | Status: DC

## 2020-04-26 MED ORDER — MIRTAZAPINE 15 MG PO TABS: 30.0000 mg | ORAL_TABLET | Freq: Every day | ORAL | 1 refills | Status: DC

## 2020-04-26 MED ORDER — METRONIDAZOLE 500 MG PO TABS: 500.0000 mg | ORAL_TABLET | Freq: Two times a day (BID) | ORAL | 0 refills | Status: DC

## 2020-04-26 MED FILL — CYCLOBENZAPRINE 10 MG TAB: 10 | 20 days supply | Qty: 20 | Fill #0

## 2020-04-26 MED FILL — PANTOPRAZOLE SOD DR 40 MG T: 40 | 30 days supply | Qty: 30 | Fill #0

## 2020-04-26 MED FILL — ERYTHROMYCIN EYE OINTMENT: 5 | 10 days supply | Qty: 4 | Fill #0

## 2020-04-26 MED FILL — metroNIDAZOLE 500 MG TABS: 500 | 7 days supply | Qty: 14 | Fill #0

## 2020-04-26 NOTE — ED Provider Notes (Signed)
HPI  SUBJECTIVE:  Sherri [name] is a 35 y.o. female who presents with several issues: First, she persistent diffuse body aches starting on 2/19.  It has not changed since it started.  Reports chills and feeling warm, nausea and diarrhea which has resolved.  No fevers >100.4. She has been alternating 400 mg of ibuprofen with 1000 mg of Tylenol without much improvement in her symptoms.  No aggravating factors.  No change in physical activity, hematuria.  No urgency, frequency, cloudy, odorous urine, sore throat, headache, fevers, cough, wheezing, shortness of breath.  She reports low nonmigratory, nonradiating constant sharp abdominal pain that has not changed since it started, dysuria, and a vaginal odor for several months.  No vaginal bleeding, itching, discharge, rash.  She is in a long-term monogamous relationship with a female who is asymptomatic.  STDs are not a concern today.  Patient was seen here 5 days ago for body aches, had diffuse tenderness and mild abdominal tenderness.  Covid, UA, upreg were negative. Patient subsequently went to the ED the next day, BMP, LFTs, CBC, 2 troponins normal.  EKG unremarkable, chest x-ray and abdominal CT were negative for any acute findings, was also given Bentyl.  She was seen by GI today for chronic constipation/nausea/GERD.  Pantoprazole, Remeron increased.  She also reports painful, pruritic left upper eyelid erythema, edema starting last night.  No eye pain, visual changes, photophobia, pain with extraocular movements .no periorbital erythema, edema.  She has tried cold compresses and cucumbers, Zyrtec and ibuprofen without improvement in her symptoms.  Symptoms worse with palpation.  She has a past medical history of fibromyalgia, hypertension, BV, yeast.  No history of hypercholesterolemia, is not on any statins, no history of PID, UTI, pyelonephritis, nephrolithiasis, STDs, diabetes.  LMP: 2/14.  Denies possibility pregnant.  PMD: [facility].  Past Medical History:  Diagnosis Date  . Fibromyalgia    denies today  . Genital herpes   . Hypertension    gestational  . 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 Urinary tract infection   . Uterine fibroid     Past Surgical History:  Procedure Laterality Date  . CESAREAN SECTION N/A 06/05/2014   Procedure: CESAREAN SECTION;  Surgeon: Truett Mainland, DO;  Location: [facility];  Service: Obstetrics;  Laterality: N/A;  . WISDOM TOOTH EXTRACTION      Family History  Problem Relation Age of Onset  . Hypertension Mother   . Hypertension Father   . Breast cancer Maternal Grandmother        62  . Colon cancer Neg Hx   . Colon polyps Neg Hx   . Kidney disease Neg Hx   . Diabetes Neg Hx   . Esophageal cancer Neg Hx   . Gallbladder disease Neg Hx   . Heart disease Neg Hx   . Asthma Neg Hx   . Stroke Neg Hx   . Stomach cancer Neg Hx     Social History   Tobacco Use  . Smoking status: Never Smoker  . Smokeless tobacco: Never Used  Vaping Use  . Vaping Use: Never used  Substance Use Topics  . Alcohol use: No  . Drug use: No    No current facility-administered medications for this encounter.  Current Outpatient Medications:  .  cyclobenzaprine (FLEXERIL) 10 MG tablet, Take 1 tablet (10 mg total) by mouth at bedtime., Disp: 20 tablet, Rfl: 0 .  erythromycin ophthalmic ointment, 1 cm ribbon to affected eyelid  qid x 10 days, Disp: 5 g, Rfl: 0 .  metroNIDAZOLE (FLAGYL) 500 MG tablet, Take 1 tablet (500 mg total) by mouth 2 (two) times daily for 7 days., Disp: 14 tablet, Rfl: 0 .  azelastine (ASTELIN) 0.1 % nasal spray, Place 2 sprays into both nostrils 2 (two) times daily., Disp: 30 mL, Rfl: 0 .  Cholecalciferol (VITAMIN D3) 125 MCG (5000 UT) CAPS, Take 1 capsule by mouth daily., Disp: , Rfl:  .  clotrimazole-betamethasone (LOTRISONE) cream, Apply to affected area 2-3 times daily x 2 weeks., Disp: 30 g, Rfl: 0 .  dicyclomine (BENTYL) 20 MG tablet,  Take 1 tablet (20 mg total) by mouth 3 (three) times daily as needed for spasms., Disp: 10 tablet, Rfl: 0 .  Ferrous Sulfate (IRON) 28 MG TABS, Take 1 tablet by mouth daily., Disp: , Rfl:  .  loperamide (IMODIUM) 2 MG capsule, Take 1 capsule (2 mg total) by mouth 2 (two) times daily as needed for diarrhea or loose stools. (Patient not taking: Reported on 04/26/2020), Disp: 14 capsule, Rfl: 0 .  metroNIDAZOLE (METROGEL) 0.75 % gel, Apply 1 application topically daily., Disp: , Rfl:  .  mirtazapine (REMERON) 15 MG tablet, Take 2 tablets (30 mg total) by mouth at bedtime., Disp: 30 tablet, Rfl: 1 .  ondansetron (ZOFRAN-ODT) 8 MG disintegrating tablet, Take 1 tablet (8 mg total) by mouth every 8 (eight) hours as needed for nausea or vomiting., Disp: 20 tablet, Rfl: 0 .  pantoprazole (PROTONIX) 40 MG tablet, Take 1 tablet (40 mg total) by mouth daily., Disp: 30 tablet, Rfl: 5 .  polyethylene glycol powder (GLYCOLAX/MIRALAX) 17 GM/SCOOP powder, Take 17 g by mouth daily., Disp: , Rfl:   Allergies  Allergen Reactions  . Macrobid [Nitrofurantoin] Nausea And Vomiting     ROS  As noted in HPI.   Physical Exam  BP (!) 142/74   Pulse 63   Temp 98.8 F (37.1 C)   Resp 17   LMP 04/15/2020   SpO2 100%   Constitutional: Well developed, well nourished, no acute distress Eyes:  EOMI, conjunctiva normal bilaterally PERRLA, no direct or consensual photophobia.  Tender area of erythema, mild swelling left upper eyelid that appears to be an incoming stye.  No periorbital erythema, edema, tenderness.        HENT: Normocephalic, atraumatic,mucus membranes moist Respiratory: Normal inspiratory effort, lungs clear bilaterally Cardiovascular: Normal rate regular rhythm no murmurs rubs or gallops GI: nondistended, soft, diffuse mild tenderness.  Active bowel sounds.  No guarding, rebound. skin: No rash, skin intact Musculoskeletal: no deformities.  Diffuse tenderness over shoulders, arms, entire back,  chest, abdomen.  No tenderness over the legs. Neurologic: Alert & oriented x 3, no focal neuro deficits Psychiatric: Speech and behavior appropriate   ED Course   Medications - No data to display  Orders Placed This Encounter  Procedures  . POC Influenza A & B Ag (Urgent Care)    Standing Status:   Standing    Number of Occurrences:   1    Results for orders placed or performed during the hospital encounter of 04/26/20 (from the past 24 hour(s))  POC Influenza A & B Ag (Urgent Care)     Status: None   Collection Time: 04/26/20  2:57 PM  Result Value Ref Range   Influenza A Ag NEGATIVE NEGATIVE   Influenza B Ag NEGATIVE NEGATIVE   No results found.  ED Clinical Impression  1. Myalgia   2. Hordeolum externum of left  upper eyelid   3. Dysuria   4. Vaginal odor      ED Assessment/Plan  Previous records, labs reviewed.  As noted in HPI.  1.  Stye.  Erythromycin ointment, lid hygiene, warm compresses.  2.  Body aches/myalgias. Patient did not have a UTI/Pyleo/ COVID on her initial evaluation, she had no intra-abdominal or other identifiable process on her ER evaluation.  The only thing that has not been checked is influenza.  Doubt rhabdomyolysis, she is not on any statins.  Wonder if this could be a fibromyalgia flare.  will try 5 to 10 mg cyclobenzaprine nightly.  She will need to follow-up with her PMD for further management.  Flu result not crossing over-negative per lab tech.  3.  Vaginal odor/dysuria.  Doubt UTI given negative recent urinalysis.  Will not recheck. will check a wet prep in addition to gonorrhea and chlamydia.  Doubt PID.  Wait-and-see prescription of Flagyl because she has frequent BV.  She has MyChart.  She will start it if her BV comes back positive.  Otherwise advised her to not fill until she gets the results.  Discussed labs, MDM, treatment plan, and plan for follow-up with patient. Discussed sn/sx that should prompt return to the ED. patient agrees  with plan.   Meds ordered this encounter  Medications  . cyclobenzaprine (FLEXERIL) 10 MG tablet    Sig: Take 1 tablet (10 mg total) by mouth at bedtime.    Dispense:  20 tablet    Refill:  0  . erythromycin ophthalmic ointment    Sig: 1 cm ribbon to affected eyelid qid x 10 days    Dispense:  5 g    Refill:  0  . metroNIDAZOLE (FLAGYL) 500 MG tablet    Sig: Take 1 tablet (500 mg total) by mouth 2 (two) times daily for 7 days.    Dispense:  14 tablet    Refill:  0    *This clinic note was created using [employer]. Therefore, there may be occasional mistakes despite careful proofreading.   ?    Melynda Ripple, MD 04/27/20 0730

## 2020-04-26 NOTE — ED Triage Notes (Signed)
Pt in with c/o left eye swelling and body aches that have been going on for about 4 days now.  Pt has taken tylenol and ibuprofen with minimal relief  Denies any vision changes

## 2020-04-26 NOTE — Progress Notes (Signed)
04/26/2020 Sherri [name] [id number] 07-18-1985   HISTORY OF PRESENT ILLNESS: This is a 35 year old female who is a patient of Dr. Lynne Leader.  She was seen by me back on January 20 for complaints of ongoing chronic nausea and constipation.  She also had a perianal rash that I treated with Lotrisone and she says that has healed.  She has been seen here at least a few times now for complaints of daily nausea and constipation.  She is on pantoprazole 20 mg daily.  I had added Remeron 15 mg at bedtime at her last visit.  She does not know if she has really had much improvement from that, but is willing to try to increase the dose further.  In regards to her constipation she is taking the MiraLAX daily, but says that sometimes she has some loose stools/diarrhea as well.  She underwent upper GI in October 2021 in lieu of EGD as she has transportation issues.  This showed a small hiatal hernia, some episodes of spontaneous GERD and normal-appearing stomach and duodenum.  She also had CT of the abdomen pelvis in October 2021 which was unremarkable.   Celiac labs were negative and H. pylori stool antigen was negative in November 2021.  While she is here she also reports some back pain and some complaints with her eyes.  She does not see her PCP regularly, and I advised that she should follow-up with them in regards to these issues.   Past Medical History:  Diagnosis Date  . Fibromyalgia    denies today  . Genital herpes   . Hypertension    gestational  . 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 Urinary tract infection   . Uterine fibroid    Past Surgical History:  Procedure Laterality Date  . CESAREAN SECTION N/A 06/05/2014   Procedure: CESAREAN SECTION;  Surgeon: Truett Mainland, DO;  Location: [facility];  Service: Obstetrics;  Laterality: N/A;  . WISDOM TOOTH EXTRACTION      reports that she has never smoked. She has never used smokeless tobacco. She reports that she  does not drink alcohol and does not use drugs. family history includes Breast cancer in her maternal grandmother; Hypertension in her father and mother. Allergies  Allergen Reactions  . Macrobid [Nitrofurantoin] Nausea And Vomiting      Outpatient Encounter Medications as of 04/26/2020  Medication Sig  . azelastine (ASTELIN) 0.1 % nasal spray Place 2 sprays into both nostrils 2 (two) times daily.  . Cholecalciferol (VITAMIN D3) 125 MCG (5000 UT) CAPS Take 1 capsule by mouth daily.  . clotrimazole-betamethasone (LOTRISONE) cream Apply to affected area 2-3 times daily x 2 weeks.  . cyclobenzaprine (FLEXERIL) 10 MG tablet Take 1 tablet (10 mg total) by mouth 3 (three) times daily as needed for muscle spasms. DO NOT DRINK ALCOHOL OR DRIVE WHILE TAKING THIS MEDICATION  . dicyclomine (BENTYL) 20 MG tablet Take 1 tablet (20 mg total) by mouth 3 (three) times daily as needed for spasms.  . Ferrous Sulfate (IRON) 28 MG TABS Take 1 tablet by mouth daily.  . metroNIDAZOLE (METROGEL) 0.75 % gel Apply 1 application topically daily.  . mirtazapine (REMERON) 15 MG tablet Take 1 tablet (15 mg total) by mouth at bedtime.  . ondansetron (ZOFRAN-ODT) 8 MG disintegrating tablet Take 1 tablet (8 mg total) by mouth every 8 (eight) hours as needed for nausea or vomiting.  . pantoprazole (PROTONIX) 20 MG tablet Take 1 tablet (  20 mg total) by mouth daily.  . polyethylene glycol powder (GLYCOLAX/MIRALAX) 17 GM/SCOOP powder Take 17 g by mouth daily.  Marland Kitchen loperamide (IMODIUM) 2 MG capsule Take 1 capsule (2 mg total) by mouth 2 (two) times daily as needed for diarrhea or loose stools. (Patient not taking: Reported on 04/26/2020)  . [DISCONTINUED] metroNIDAZOLE (FLAGYL) 500 MG tablet Take 1 tablet (500 mg total) by mouth 2 (two) times daily.   No facility-administered encounter medications on file as of 04/26/2020.     REVIEW OF SYSTEMS  : All other systems reviewed and negative except where noted in the History of Present  Illness.   PHYSICAL EXAM: BP 130/70 (BP Location: Left Arm, Patient Position: Sitting, Cuff Size: Normal)   Pulse 96   Ht 4' 10.66" (1.49 m)   Wt 196 lb 4 oz (89 kg)   LMP 04/15/2020   BMI 40.10 kg/m  General: Well developed AA female in no acute distress Head: Normocephalic and atraumatic Eyes:  Sclerae anicteric, conjunctiva pink. Ears: Normal auditory acuity Lungs: Clear throughout to auscultation; no W/R/R. Heart: Regular rate and rhythm; no M/R/G. Abdomen: Soft, non-distended.  BS present.  Mild diffuse TTP. Musculoskeletal: Symmetrical with no gross deformities  Skin: No lesions on visible extremities Extremities: No edema  Neurological: Alert oriented x 4, grossly non-focal Psychological:  Alert and cooperative. Normal mood and affect  ASSESSMENT AND PLAN: *Perianal rash: Resolved with the use of clotrimazole-betamethasone. *Chronic constipation: Uses MiraLAX daily, but says sometimes she has loose stools. I would like her to continue the MiraLAX daily but also add in a dose of Benefiber, 2 teaspoons mixed in 8 ounces of liquid daily to see if that will help with bulk. She did not tolerate Linzess previously. *Chronic ongoing nausea/GERD: Is currently only on pantoprazole 20 mg daily. I'm going to increase that to 40 mg daily. Was started on Remeron 15 mg at bedtime at her last visit in January. We'll increase that to 30 mg at bedtime instead to see if that helps. New prescriptions for pantoprazole and Remeron were sent to her pharmacy. She reported the FD guard made her sick previously.  **Follow-up in about 6 weeks.   CC:  Caryl Never*

## 2020-04-26 NOTE — Discharge Instructions (Addendum)
Start doing warm compresses 4 times a day. You may get some baby shampoo, dilute this with warm water, and gently wipe your upper and lower eyelid while you are taking a shower. This will help prevent the recurrence of any styes or chalazions. Follow up with ophthalmologist if this does not get better within a week. Try not to rub your eyes. You may use Systane as often as you want for comfort. Return immediately to the ER for fever above 100.4, if you have pain moving your eyes, any visual changes, nausea, vomiting, headaches, a rash around your eye, or any other concerns.  Myalgias-you can continue Tylenol and ibuprofen.  I am going to try some cyclobenzaprine to take before bedtime which is for presumed fibromyalgia flare.  Start at 5 mg, and if that does not work, titrate up to 10 mg.  Follow-up with your doctor for further management.  Wait-and-see prescription for Flagyl-wait until the results come back.  If it is positive for BV then go ahead and fill the Flagyl and take it.  I am not sure that this is contributing to your body aches though.

## 2020-04-26 NOTE — Patient Instructions (Addendum)
If you are age 34 or older, your body mass index should be between 23-30. Your Body mass index is 40.1 kg/m. If this is out of the aforementioned range listed, please consider follow up with your Primary Care Provider.  If you are age 74 or younger, your body mass index should be between 19-25. Your Body mass index is 40.1 kg/m. If this is out of the aformentioned range listed, please consider follow up with your Primary Care Provider.   We have sent the following medications to your pharmacy for you to pick up at your convenience: Increase Remeron 30 mg daily.  Increase Pantoprazole 40 mg daily.  Start Benefiber  2 teaspoons in 8 ounces of liquid daily.  Continue Miralax.   Call back in 2 weeks with an update, ask for Koren Shiver, RN.   Follow up with Dr. Fuller Plan.

## 2020-04-29 LAB — CERVICOVAGINAL ANCILLARY ONLY
Bacterial Vaginitis (gardnerella): POSITIVE — AB
Chlamydia: NEGATIVE
Comment: NEGATIVE
Comment: NEGATIVE
Comment: NEGATIVE
Comment: NORMAL
Neisseria Gonorrhea: NEGATIVE
Trichomonas: NEGATIVE

## 2020-04-29 NOTE — Progress Notes (Signed)
Reviewed and agree with management plan.  Malcolm T. Stark, MD FACG [phone]  

## 2020-04-30 ENCOUNTER — Other Ambulatory Visit: Payer: Self-pay

## 2020-04-30 ENCOUNTER — Other Ambulatory Visit (HOSPITAL_COMMUNITY): Payer: Self-pay | Admitting: Emergency Medicine

## 2020-04-30 ENCOUNTER — Encounter (HOSPITAL_COMMUNITY): Payer: Self-pay | Admitting: Emergency Medicine

## 2020-04-30 ENCOUNTER — Emergency Department (HOSPITAL_COMMUNITY)
Admission: EM | Admit: 2020-04-30 | Discharge: 2020-04-30 | Disposition: A | Discharge status: Home / Self Care | Payer: Self-pay | Attending: Emergency Medicine | Admitting: Emergency Medicine

## 2020-04-30 ENCOUNTER — Emergency Department (HOSPITAL_COMMUNITY): Payer: Self-pay

## 2020-04-30 DIAGNOSIS — Z79899 Other long term (current) drug therapy: Secondary | ICD-10-CM | POA: Insufficient documentation

## 2020-04-30 DIAGNOSIS — I10 Essential (primary) hypertension: Secondary | ICD-10-CM | POA: Insufficient documentation

## 2020-04-30 DIAGNOSIS — Z20822 Contact with and (suspected) exposure to covid-19: Secondary | ICD-10-CM | POA: Insufficient documentation

## 2020-04-30 DIAGNOSIS — F419 Anxiety disorder, unspecified: Secondary | ICD-10-CM | POA: Insufficient documentation

## 2020-04-30 DIAGNOSIS — R059 Cough, unspecified: Secondary | ICD-10-CM | POA: Insufficient documentation

## 2020-04-30 DIAGNOSIS — R0789 Other chest pain: Secondary | ICD-10-CM | POA: Insufficient documentation

## 2020-04-30 LAB — CBC
HCT: 40.4 % (ref 36.0–46.0)
Hemoglobin: 13.1 g/dL (ref 12.0–15.0)
MCH: 28.3 pg (ref 26.0–34.0)
MCHC: 32.4 g/dL (ref 30.0–36.0)
MCV: 87.3 fL (ref 80.0–100.0)
Platelets: 297 10*3/uL (ref 150–400)
RBC: 4.63 MIL/uL (ref 3.87–5.11)
RDW: 15.9 % — ABNORMAL HIGH (ref 11.5–15.5)
WBC: 7.7 10*3/uL (ref 4.0–10.5)
nRBC: 0 % (ref 0.0–0.2)

## 2020-04-30 LAB — D-DIMER, QUANTITATIVE: D-Dimer, Quant: 0.36 ug/mL-FEU (ref 0.00–0.50)

## 2020-04-30 LAB — BASIC METABOLIC PANEL
Anion gap: 12 (ref 5–15)
BUN: 11 mg/dL (ref 6–20)
CO2: 19 mmol/L — ABNORMAL LOW (ref 22–32)
Calcium: 9.8 mg/dL (ref 8.9–10.3)
Chloride: 106 mmol/L (ref 98–111)
Creatinine, Ser: 0.88 mg/dL (ref 0.44–1.00)
GFR, Estimated: >60 mL/min (ref >60)
Glucose, Bld: 99 mg/dL (ref 70–99)
Potassium: 3.6 mmol/L (ref 3.5–5.1)
Sodium: 137 mmol/L (ref 135–145)

## 2020-04-30 LAB — SARS CORONAVIRUS 2 (TAT 6-24 HRS): SARS Coronavirus 2: NEGATIVE

## 2020-04-30 LAB — TROPONIN I (HIGH SENSITIVITY): Troponin I (High Sensitivity): 3 ng/L (ref <18)

## 2020-04-30 MED ORDER — LIDOCAINE VISCOUS HCL 2 % MT SOLN
15.0000 mL | Freq: Once | OROMUCOSAL | Status: AC
  Administered 2020-04-30: 15 mL via ORAL
  Filled 2020-04-30: qty 15

## 2020-04-30 MED ORDER — DIAZEPAM 5 MG PO TABS
5.0000 mg | ORAL_TABLET | Freq: Once | ORAL | Status: AC
  Administered 2020-04-30: 5 mg via ORAL
  Filled 2020-04-30: qty 1

## 2020-04-30 MED ORDER — MORPHINE SULFATE (PF) 4 MG/ML IV SOLN
4.0000 mg | Freq: Once | INTRAVENOUS | Status: AC
  Administered 2020-04-30: 4 mg via INTRAVENOUS
  Filled 2020-04-30: qty 1

## 2020-04-30 MED ORDER — ALUM & MAG HYDROXIDE-SIMETH 200-200-20 MG/5ML PO SUSP
30.0000 mL | Freq: Once | ORAL | Status: AC
  Administered 2020-04-30: 30 mL via ORAL
  Filled 2020-04-30: qty 30

## 2020-04-30 MED ORDER — ACETAMINOPHEN 500 MG PO TABS: 1000.0000 mg | ORAL_TABLET | Freq: Four times a day (QID) | ORAL | 0 refills | Status: DC | PRN

## 2020-04-30 MED ORDER — BENZONATATE 100 MG PO CAPS: 100.0000 mg | ORAL_CAPSULE | Freq: Three times a day (TID) | ORAL | 0 refills | Status: DC

## 2020-04-30 NOTE — ED Triage Notes (Signed)
Pt arrives to ED with c/o possible anxiety/panic attack today while at work. States has been stressed with work and life lately. Reports getting upset and having tightness in neck/chest.

## 2020-04-30 NOTE — Discharge Instructions (Signed)
You symptoms may suggest covid infection. Check result through MyChart, link below.  If test positive, follow instruction below  Recommendations for at home COVID-19 symptoms management:  Please continue isolation at home. Call [phone] to see whether you might be eligible for therapeutic antibody infusions (leave your name and they will call you back).  If have acute worsening of symptoms please go to ER/urgent care for further evaluation. Check pulse oximetry and if below 90-92% please go to ER. The following supplements MAY help:  Vitamin C 500mg  twice a day and Quercetin 250-500 mg twice a day Vitamin D3 2000 - 4000 u/day B Complex vitamins Zinc 75-100 mg/day Melatonin 6-10 mg at night (the optimal dose is unknown) Aspirin 81mg /day (if no history of bleeding issues)

## 2020-04-30 NOTE — ED Provider Notes (Signed)
[facility] EMERGENCY DEPARTMENT Provider Note   CSN: [id number] Arrival date & time: 04/30/20  1107     History Chief Complaint  Patient presents with  . Anxiety    Sherri [name] is a 35 y.o. female.  The history is provided by the patient and medical records. No language interpreter was used.  Anxiety     35 year old obese female with history of fibromyalgia, hypertension, presenting to the ED with concerns of chest pain.  Patient states she was at work today and approximately 3 hours ago she developed acute onset of pain to her chest.  She described pain as a sharp pleuritic pain to her upper chest, has been waxing and waning worse with breathing and also with movement.  She also endorsed having some chills, and congestion and occasional cough.  She does not endorse fever, headache, loss of taste or smell, sore throat, or rash.  She denies any recent strenuous activities.  She does not have any significant cardiac history or family history of cardiac disease.  She denies any history of prior PE or DVT.  She denies feeling anxious or depressed.  No specific treatment tried.  She was brought here via EMS from her workplace.  She has been fully vaccinated for COVID-19.  She rates her pain is moderate in intensity, feels better if she rocks her body back and forth.  She report 1 similar episode of pain approximately 6 months ago when she was evaluated in the ED without any definitive diagnosis.  She denies history of heartburn, states she ate this morning.  Past Medical History:  Diagnosis Date  . Fibromyalgia    denies today  . Genital herpes   . Hypertension    gestational  . 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 Urinary tract infection   . Uterine fibroid     Patient Active Problem List   Diagnosis Date Noted  . Gastroesophageal reflux disease 04/26/2020  . Perianal rash 03/26/2020  . Chronic nausea 03/26/2020  . Chronic  constipation 03/09/2019  . Pelvic pain in female 03/09/2019  . Hypertension   . Genital herpes   . Status post cesarean delivery 06/06/2014  . Uterine fibroid 12/25/2013  . Obesity 12/25/2013    Past Surgical History:  Procedure Laterality Date  . CESAREAN SECTION N/A 06/05/2014   Procedure: CESAREAN SECTION;  Surgeon: Truett Mainland, DO;  Location: [facility];  Service: Obstetrics;  Laterality: N/A;  . WISDOM TOOTH EXTRACTION       OB History    Gravida  1   Para  1   Term      Preterm  1   AB      Living  2     SAB      IAB      Ectopic      Multiple  1   Live Births  2           Family History  Problem Relation Age of Onset  . Hypertension Mother   . Hypertension Father   . Breast cancer Maternal Grandmother        60  . Colon cancer Neg Hx   . Colon polyps Neg Hx   . Kidney disease Neg Hx   . Diabetes Neg Hx   . Esophageal cancer Neg Hx   . Gallbladder disease Neg Hx   . Heart disease Neg Hx   . Asthma Neg Hx   . Stroke  Neg Hx   . Stomach cancer Neg Hx     Social History   Tobacco Use  . Smoking status: Never Smoker  . Smokeless tobacco: Never Used  Vaping Use  . Vaping Use: Never used  Substance Use Topics  . Alcohol use: No  . Drug use: No    Home Medications Prior to Admission medications   Medication Sig Start Date End Date Taking? Authorizing Provider  azelastine (ASTELIN) 0.1 % nasal spray Place 2 sprays into both nostrils 2 (two) times daily. 02/10/20   Ok Edwards, PA-C  Cholecalciferol (VITAMIN D3) 125 MCG (5000 UT) CAPS Take 1 capsule by mouth daily.    [provider]  clotrimazole-betamethasone (LOTRISONE) cream Apply to affected area 2-3 times daily x 2 weeks. 04/04/20   Zehr, Laban Emperor, PA-C  cyclobenzaprine (FLEXERIL) 10 MG tablet Take 1 tablet (10 mg total) by mouth at bedtime. 04/26/20   Melynda Ripple, MD  dicyclomine (BENTYL) 20 MG tablet Take 1 tablet (20 mg total) by mouth 3 (three) times daily as needed for  spasms. 04/22/20   Davonna Belling, MD  erythromycin ophthalmic ointment 1 cm ribbon to affected eyelid qid x 10 days 04/26/20   Melynda Ripple, MD  Ferrous Sulfate (IRON) 28 MG TABS Take 1 tablet by mouth daily.    [provider]  loperamide (IMODIUM) 2 MG capsule Take 1 capsule (2 mg total) by mouth 2 (two) times daily as needed for diarrhea or loose stools. Patient not taking: Reported on 04/26/2020 04/21/20   Jaynee Eagles, PA-C  metroNIDAZOLE (FLAGYL) 500 MG tablet Take 1 tablet (500 mg total) by mouth 2 (two) times daily for 7 days. 04/26/20 05/03/20  Melynda Ripple, MD  metroNIDAZOLE (METROGEL) 0.75 % gel Apply 1 application topically daily.    [provider]  mirtazapine (REMERON) 15 MG tablet Take 2 tablets (30 mg total) by mouth at bedtime. 04/26/20   Zehr, Laban Emperor, PA-C  ondansetron (ZOFRAN-ODT) 8 MG disintegrating tablet Take 1 tablet (8 mg total) by mouth every 8 (eight) hours as needed for nausea or vomiting. 04/21/20   Jaynee Eagles, PA-C  pantoprazole (PROTONIX) 40 MG tablet Take 1 tablet (40 mg total) by mouth daily. 04/26/20   Zehr, Janett Billow D, PA-C  polyethylene glycol powder (GLYCOLAX/MIRALAX) 17 GM/SCOOP powder Take 17 g by mouth daily.    [provider]    Allergies    Macrobid [nitrofurantoin]  Review of Systems   Review of Systems  All other systems reviewed and are negative.   Physical Exam Updated Vital Signs BP 134/82 (BP Location: Left Arm)   Pulse 81   Temp 98.6 F (37 C) (Oral)   Resp 20   LMP 04/15/2020   SpO2 100%   Physical Exam Vitals and nursing note reviewed.  Constitutional:      General: She is not in acute distress.    Appearance: She is well-developed and well-nourished. She is obese.     Comments: Patient appears mildly uncomfortable, rocking back and forth  HENT:     Head: Atraumatic.  Eyes:     Conjunctiva/sclera: Conjunctivae normal.  Cardiovascular:     Rate and Rhythm: Normal rate and regular rhythm.      Pulses: Normal pulses.     Heart sounds: Normal heart sounds. No murmur heard. No friction rub. No gallop.   Pulmonary:     Effort: Pulmonary effort is normal.     Breath sounds: Normal breath sounds. No wheezing, rhonchi or rales.  Chest:     Chest wall: Tenderness (Tenderness to palpation of anterior chest wall no overlying skin changes.) present.  Abdominal:     Palpations: Abdomen is soft.     Tenderness: There is no abdominal tenderness.  Musculoskeletal:     Cervical back: Neck supple.     Right lower leg: No edema.     Left lower leg: No edema.  Skin:    Capillary Refill: Capillary refill takes less than 2 seconds.     Findings: No rash.  Neurological:     Mental Status: She is alert and oriented to person, place, and time.  Psychiatric:        Mood and Affect: Mood and affect and mood normal.     ED Results / Procedures / Treatments   Labs (all labs ordered are listed, but only abnormal results are displayed) Labs Reviewed  BASIC METABOLIC PANEL - Abnormal; Notable for the following components:      Result Value   CO2 19 (*)    All other components within normal limits  CBC - Abnormal; Notable for the following components:   RDW 15.9 (*)    All other components within normal limits  SARS CORONAVIRUS 2 (TAT 6-24 HRS)  D-DIMER, QUANTITATIVE  TROPONIN I (HIGH SENSITIVITY)  TROPONIN I (HIGH SENSITIVITY)    EKG EKG Interpretation  Date/Time:  Tuesday April 30 2020 12:47:56 EST Ventricular Rate:  81 PR Interval:  160 QRS Duration: 80 QT Interval:  390 QTC Calculation: 453 R Axis:   13 Text Interpretation: Normal sinus rhythm Normal ECG NSR,  no change from previous Confirmed by Lavenia Atlas [phone]) on 04/30/2020 1:31:57 PM   Radiology DG Chest Port 1 View  Result Date: 04/30/2020 CLINICAL DATA:  Chest pain. EXAM: PORTABLE CHEST 1 VIEW COMPARISON:  Chest radiographs 04/22/2020 and earlier. FINDINGS: Heart size within normal limits. No appreciable airspace  consolidation.No evidence of pleural effusion or pneumothorax. No acute bony abnormality identified. IMPRESSION: No evidence of active cardiopulmonary disease. Electronically Signed   By: Kellie Simmering DO   On: 04/30/2020 14:03    Procedures Procedures   Medications Ordered in ED Medications  morphine 4 MG/ML injection 4 mg (4 mg Intravenous Given 04/30/20 1258)  diazepam (VALIUM) tablet 5 mg (5 mg Oral Given 04/30/20 1349)  alum & mag hydroxide-simeth (MAALOX/MYLANTA) 200-200-20 MG/5ML suspension 30 mL (30 mLs Oral Given 04/30/20 1443)    And  lidocaine (XYLOCAINE) 2 % viscous mouth solution 15 mL (15 mLs Oral Given 04/30/20 1444)    ED Course  I have reviewed the triage vital signs and the nursing notes.  Pertinent labs & imaging results that were available during my care of the patient were reviewed by me and considered in my medical decision making (see chart for details).    MDM Rules/Calculators/A&P                          BP 135/81 (BP Location: Right Arm)   Pulse 80   Temp 98 F (36.7 C) (Oral)   Resp 18   LMP 04/15/2020   SpO2 98%   Final Clinical Impression(s) / ED Diagnoses Final diagnoses:  Suspected COVID-19 virus infection    Rx / DC Orders ED Discharge Orders         Ordered    acetaminophen (TYLENOL) 500 MG tablet  Every 6 hours PRN        04/30/20 1512    benzonatate (TESSALON)  100 MG capsule  Every 8 hours        04/30/20 1512         12:29 PM Patient presents with pain atypical of ACS.  She does endorse some pleuritic component to her chest discomfort she appears anxious.  However she has low risk of PE, D-dimer ordered.  Will obtain chest x-ray, labs, EKG and troponin.  Will provide symptomatic treatment.  Doubt panic attack.  1:33 PM Clinically without concerning findings, fortunately negative D-dimer therefore low suspicion for PE or DVT.  I also doubt dissection causing her symptoms.  Normal WBC.  2:11 PM Work-up today has been fairly  unremarkable, electrolyte panels are reassuring, normal WBC, normal H&H, normal troponin, chest x-ray unremarkable, EKG unremarkable.  Patient does have symptoms that could be early signs of Covid since she is endorsing aches and pain having chills and diarrhea.  A Covid test have been ordered without results during this visit.  Patient not reporting improvement with morphine, valium or GI cocktail.  However, VSS, she has intact pulses to all 4 extremities and no widening mediastinum on CXR.  She does endorse, chill, myalgias, diarrhea.  Sherri [name] was evaluated in Emergency Department on 04/30/2020 for the symptoms described in the history of present illness. She was evaluated in the context of the global COVID-19 pandemic, which necessitated consideration that the patient might be at risk for infection with the SARS-CoV-2 virus that causes COVID-19. Institutional protocols and algorithms that pertain to the evaluation of patients at risk for COVID-19 are in a state of rapid change based on information released by regulatory bodies including the CDC and federal and state organizations. These policies and algorithms were followed during the patient's care in the ED.    Domenic Moras, PA-C 04/30/20 1551    Horton, Alvin Critchley, DO 05/02/20 1755

## 2020-04-30 NOTE — ED Notes (Signed)
Made Charge RN Lexine Baton) aware that patient needs to be on cardiac monitor in a room. CN stated patient can continue to be in hall on portable cardiac monitor. Pt placed on monitor at this time. Pt is c/o chest pain and MD is aware.

## 2020-05-01 ENCOUNTER — Telehealth: Payer: Self-pay | Admitting: *Deleted

## 2020-05-01 NOTE — Telephone Encounter (Signed)
Transition Care Management Follow-up Telephone Call  Date of discharge and from where: 04-30-2020 [facility]  How have you been since you were released from the hospital?   Feeling better, resting has a note to be out of work.  Taking tylenol helping.  Any questions or concerns? No  Items Reviewed:  Did the pt receive and understand the discharge instructions provided? Yes   Medications obtained and verified? Yes   Other? No   Any new allergies since your discharge? No   Dietary orders reviewed? n/a  Do you have support at home? Yes     Functional Questionnaire: (I = Independent and D = Dependent) ADLs: I  Bathing/Dressing- I  Meal Prep- I   Eating- I  Maintaining continence- I  Transferring/Ambulation- I  Managing Meds- I  Follow up appointments reviewed:   PCP Hospital f/u appt confirmed? Yes  Scheduled to see  on   Dr. Juleen China 05-08-2020 @ [facility] f/u appt confirmed? No    Are transportation arrangements needed? No   If their condition worsens, is the pt aware to call PCP or go to the Emergency Dept.? yes  Was the patient provided with contact information for the PCP's office or ED? yes  Was to pt encouraged to call back with questions or concerns? yes

## 2020-05-08 ENCOUNTER — Other Ambulatory Visit: Payer: Self-pay

## 2020-05-08 ENCOUNTER — Telehealth: Payer: Self-pay | Admitting: Internal Medicine

## 2020-05-09 ENCOUNTER — Encounter: Payer: Self-pay | Admitting: Internal Medicine

## 2020-05-09 ENCOUNTER — Other Ambulatory Visit (HOSPITAL_COMMUNITY)
Admission: RE | Admit: 2020-05-09 | Discharge: 2020-05-09 | Disposition: A | Discharge status: Home / Self Care | Payer: Self-pay | Source: Ambulatory Visit | Attending: Internal Medicine | Admitting: Internal Medicine

## 2020-05-09 ENCOUNTER — Ambulatory Visit (INDEPENDENT_AMBULATORY_CARE_PROVIDER_SITE_OTHER): Payer: Self-pay | Admitting: Internal Medicine

## 2020-05-09 ENCOUNTER — Other Ambulatory Visit: Payer: Self-pay | Admitting: Internal Medicine

## 2020-05-09 VITALS — BP 135/84 | HR 81 | Temp 97.2°F | Resp 16 | Ht 59.0 in | Wt 195.0 lb

## 2020-05-09 DIAGNOSIS — M797 Fibromyalgia: Secondary | ICD-10-CM

## 2020-05-09 DIAGNOSIS — N898 Other specified noninflammatory disorders of vagina: Secondary | ICD-10-CM | POA: Insufficient documentation

## 2020-05-09 DIAGNOSIS — R300 Dysuria: Secondary | ICD-10-CM

## 2020-05-09 LAB — POCT URINALYSIS DIP (CLINITEK)
Bilirubin, UA: NEGATIVE
Glucose, UA: NEGATIVE mg/dL
Ketones, POC UA: NEGATIVE mg/dL
Leukocytes, UA: NEGATIVE
Nitrite, UA: NEGATIVE
POC PROTEIN,UA: NEGATIVE
Spec Grav, UA: 1.015 (ref 1.010–1.025)
Urobilinogen, UA: 0.2 E.U./dL
pH, UA: 5.5 (ref 5.0–8.0)

## 2020-05-09 MED ORDER — DULOXETINE HCL 20 MG PO CPEP: ORAL_CAPSULE | ORAL | 0 refills | Status: DC

## 2020-05-09 MED FILL — DULoxetine HCL 20 MG CPEP: 20 | 21 days supply | Qty: 42 | Fill #0

## 2020-05-09 NOTE — Progress Notes (Signed)
Subjective:    Sherri [name] - 35 y.o. female MRN [id number]  Date of birth: 1985-12-19  HPI  Sherri [name] is here for acute concerns.  Has been having dysuria for 2+ weeks. Currently menstruating. Prior to menses, no blood in urine. Increased urinary frequency including during the night. Occasional urgency. Also endorses vaginal discharge. Reports generalized body aches. Has been taking Ibuprofen and Tylenol without relief. Afebrile.   Body aches for 2 months. Has a history of fibromyalgia. Does have tender points. Has never been on medications for fibromyalgia. Tried Flexeril without relief. No recent injuries. Has been flu and COVID negative. Endorses chronic headaches.    Health Maintenance:  Health Maintenance Due  Topic Date Due  . PAP SMEAR-Modifier  07/17/2017  . COVID-19 Vaccine (2 - Booster for Janssen series) 08/07/2019    -  reports that she has never smoked. She has never used smokeless tobacco. - Review of Systems: Per HPI. - Past Medical History: Patient Active Problem List   Diagnosis Date Noted  . Gastroesophageal reflux disease 04/26/2020  . Perianal rash 03/26/2020  . Chronic nausea 03/26/2020  . Chronic constipation 03/09/2019  . Pelvic pain in female 03/09/2019  . Hypertension   . Genital herpes   . Status post cesarean delivery 06/06/2014  . Uterine fibroid 12/25/2013  . Obesity 12/25/2013   - Medications: reviewed and updated   Objective:   Physical Exam BP 135/84   Pulse 81   Temp (!) 97.2 F (36.2 C)   Resp 16   Ht 4\' 11"  (1.499 m)   Wt 195 lb (88.5 kg)   LMP 05/08/2020 (Approximate)   SpO2 99%   BMI 39.39 kg/m  Physical Exam Constitutional:      General: She is not in acute distress.    Appearance: She is not diaphoretic.  Cardiovascular:     Rate and Rhythm: Normal rate.  Pulmonary:     Effort: Pulmonary effort is normal. No respiratory distress.  Musculoskeletal:        General: Normal range of motion.  Skin:    General:  Skin is warm and dry.  Neurological:     Mental Status: She is alert and oriented to person, place, and time.  Psychiatric:        Mood and Affect: Affect normal.        Judgment: Judgment normal.            Assessment & Plan:   1. Dysuria Patient with history of chronic dysuria. Will obtain urine culture. UA with large RBCs likely related to menses. Will plan to repeat UA at future visit to ensure has resolved. Otherwise, UA unremarkable. Suspect recurrent bladder pains may be related to fibromyalgia as below.  - Urine Culture - POCT URINALYSIS DIP (CLINITEK)  2. Vaginal discharge - Cervicovaginal ancillary only - POCT URINALYSIS DIP (CLINITEK)  3. Fibromyalgia Patient with multiple urgent care and ER visits all with extensive negative work up including neg flu, neg covid, normal electrolytes, normal d-dimer, neg troponins, normal CXR, neg CT abdomen/pelvis. Patient continues to have non-specific diffuse muscle pains, bladder pain, headaches, etc with apparent h/o fibromyalgia. Also endorses problems with sleep. Will start medication for presumed fibromyalgia. Titrate Cymbalta as below. Follow up in 6-8 weeks.   - DULoxetine (CYMBALTA) 20 MG capsule; Take one tablet daily x1 week. Then increase to 2 tablets x1 week. Then 3 tablets daily on week three.  Dispense: 180 capsule; Refill: 0  Phill Myron, D.O. 05/09/2020, 2:06  PM Primary Care at [facility]

## 2020-05-09 NOTE — Progress Notes (Signed)
Generalized body pain x 3-4 weeks Taking tylenol and ibuprofen as directed No relief  Dysuria and vaginal odor- 2 mos

## 2020-05-10 LAB — CERVICOVAGINAL ANCILLARY ONLY
Bacterial Vaginitis (gardnerella): POSITIVE — AB
Candida Glabrata: NEGATIVE
Candida Vaginitis: NEGATIVE
Chlamydia: NEGATIVE
Comment: NEGATIVE
Comment: NEGATIVE
Comment: NEGATIVE
Comment: NEGATIVE
Comment: NEGATIVE
Comment: NORMAL
Neisseria Gonorrhea: NEGATIVE
Trichomonas: NEGATIVE

## 2020-05-11 LAB — URINE CULTURE

## 2020-05-13 ENCOUNTER — Other Ambulatory Visit: Payer: Self-pay | Admitting: Internal Medicine

## 2020-05-13 MED ORDER — METRONIDAZOLE 500 MG PO TABS: 500.0000 mg | ORAL_TABLET | Freq: Two times a day (BID) | ORAL | 0 refills | Status: DC

## 2020-05-14 ENCOUNTER — Telehealth: Payer: Self-pay | Admitting: Internal Medicine

## 2020-05-14 NOTE — Telephone Encounter (Signed)
Pt called states Flagyl 500 mg tablets make her stomach hurt. Pt request provider call in [facility] instead. [place]

## 2020-05-15 ENCOUNTER — Other Ambulatory Visit: Payer: Self-pay | Admitting: Internal Medicine

## 2020-05-15 ENCOUNTER — Telehealth: Payer: Self-pay | Admitting: Internal Medicine

## 2020-05-15 MED ORDER — METRONIDAZOLE 0.75 % VA GEL: 1.0000 | Freq: Two times a day (BID) | VAGINAL | 0 refills | Status: DC

## 2020-05-15 NOTE — Telephone Encounter (Signed)
Called patient, left voicemail the Rx was sent to [facility] pharmacy.

## 2020-05-15 NOTE — Telephone Encounter (Signed)
Metrogel sent in.   Phill Myron, D.O. Primary Care at [facility]  05/15/2020, 11:15 AM

## 2020-05-15 NOTE — Telephone Encounter (Signed)
Pt called states Cymbalta makes her tired and it is difficult to go to work tired.   Pt has been taking one pill per day.  Please advise.  Pt ph [phone].

## 2020-05-16 ENCOUNTER — Other Ambulatory Visit (HOSPITAL_COMMUNITY): Payer: Self-pay | Admitting: Family

## 2020-05-16 ENCOUNTER — Ambulatory Visit (HOSPITAL_COMMUNITY): Payer: Self-pay

## 2020-05-16 ENCOUNTER — Ambulatory Visit (HOSPITAL_COMMUNITY)
Admission: EM | Admit: 2020-05-16 | Discharge: 2020-05-16 | Disposition: A | Discharge status: Home / Self Care | Payer: Self-pay | Attending: Family | Admitting: Family

## 2020-05-16 ENCOUNTER — Other Ambulatory Visit: Payer: Self-pay

## 2020-05-16 ENCOUNTER — Encounter (HOSPITAL_COMMUNITY): Payer: Self-pay

## 2020-05-16 DIAGNOSIS — T50905A Adverse effect of unspecified drugs, medicaments and biological substances, initial encounter: Secondary | ICD-10-CM

## 2020-05-16 DIAGNOSIS — R1084 Generalized abdominal pain: Secondary | ICD-10-CM

## 2020-05-16 DIAGNOSIS — R110 Nausea: Secondary | ICD-10-CM

## 2020-05-16 DIAGNOSIS — R350 Frequency of micturition: Secondary | ICD-10-CM

## 2020-05-16 DIAGNOSIS — R5383 Other fatigue: Secondary | ICD-10-CM

## 2020-05-16 LAB — POCT URINALYSIS DIPSTICK, ED / UC
Bilirubin Urine: NEGATIVE
Glucose, UA: NEGATIVE mg/dL
Ketones, ur: NEGATIVE mg/dL
Leukocytes,Ua: NEGATIVE
Nitrite: NEGATIVE
Protein, ur: NEGATIVE mg/dL
Specific Gravity, Urine: 1.02 (ref 1.005–1.030)
Urobilinogen, UA: 0.2 mg/dL (ref 0.0–1.0)
pH: 5 (ref 5.0–8.0)

## 2020-05-16 MED ORDER — SULFAMETHOXAZOLE-TRIMETHOPRIM 800-160 MG PO TABS: 1.0000 | ORAL_TABLET | Freq: Two times a day (BID) | ORAL | 0 refills | Status: DC

## 2020-05-16 NOTE — ED Triage Notes (Signed)
Pt reports fatigue and body aches x 7 days. Denies cough, fever, nasal congestion.

## 2020-05-16 NOTE — Discharge Instructions (Addendum)
Since you are now having a low grade fever, continued urinary frequency and lower abdominal pain, recommend start Bactrim antibiotic 1 tablet twice a day for 3 days. May change medication pending urine culture results. Decrease Cymbalta to 1 tablet daily. Recommend call [facility] today to schedule appointment for further evaluation and follow-up.

## 2020-05-16 NOTE — ED Provider Notes (Signed)
[facility]    CSN: [id number] Arrival date & time: 05/16/20  1453      History   Chief Complaint Chief Complaint  Patient presents with  . Appointment    1500  . Generalized Body Aches  . Fatigue    HPI Sherri [name] is a 35 y.o. female.   35 year old female presents with continued fatigue and body aches that started over 1 week ago. Today feeling warm with low grade fever. Concerned that she continues to feel more weak and tired. Started feeling worse after starting a new medication for fibromyalgia- started taking Cymbalta over 1 week ago and now taking 2 tablets daily (40mg ). Has had multiple evaluations in the past 2 months for fatigue and body aches. Recently had flu testing and COVID 19 testing which were all negative. Was seen at her PCP's 1 week ago (05/09/20) and evaluated for possible UTI, Vaginitis and body aches/fibromyalgia. UA was positive for blood but was currently on her menses. Urine sent for culture and grew out mixed flora but 10,000 to 25,000 colonies- no antibiotics prescribed. Thought urinary symptoms may be due to bladder spasms related to fibromyalgia. Vaginal swab tested and was positive for BV but negative for all other STD's. Started on Metrogel 4 days ago. Continues to have urinary frequency and now more left sided CVA tenderness. Continues to feel nauseous and unable to go to work. Denies any nasal congestion, cough, vomiting or diarrhea. Other chronic health issues include GERD, HTN, and Fibromyalgia. Currently on Remeron, Protonix, Bentyl, vit D and Iron daily. Has tried Zofran for nausea and Flexeril for muscle pain with minimal relief.    The history is provided by the patient.    Past Medical History:  Diagnosis Date  . Fibromyalgia    denies today  . Genital herpes   . Hypertension    gestational  . 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 Urinary tract infection   . Uterine fibroid     Patient Active  Problem List   Diagnosis Date Noted  . Gastroesophageal reflux disease 04/26/2020  . Perianal rash 03/26/2020  . Chronic nausea 03/26/2020  . Chronic constipation 03/09/2019  . Pelvic pain in female 03/09/2019  . Hypertension   . Genital herpes   . Status post cesarean delivery 06/06/2014  . Uterine fibroid 12/25/2013  . Obesity 12/25/2013    Past Surgical History:  Procedure Laterality Date  . CESAREAN SECTION N/A 06/05/2014   Procedure: CESAREAN SECTION;  Surgeon: Truett Mainland, DO;  Location: [facility];  Service: Obstetrics;  Laterality: N/A;  . WISDOM TOOTH EXTRACTION      OB History    Gravida  1   Para  1   Term      Preterm  1   AB      Living  2     SAB      IAB      Ectopic      Multiple  1   Live Births  2            Home Medications    Prior to Admission medications   Medication Sig Start Date End Date Taking? Authorizing Provider  sulfamethoxazole-trimethoprim (BACTRIM DS) 800-160 MG tablet Take 1 tablet by mouth 2 (two) times daily for 3 days. 05/16/20 05/19/20 Yes Teeghan Hammer, Sherri Stairs, NP  acetaminophen (TYLENOL) 500 MG tablet Take 2 tablets (1,000 mg total) by mouth every 6 (six) hours as needed  for moderate pain, fever or headache. 04/30/20   Domenic Moras, PA-C  azelastine (ASTELIN) 0.1 % nasal spray Place 2 sprays into both nostrils 2 (two) times daily. 02/10/20   Ok Edwards, PA-C  Cholecalciferol (VITAMIN D3) 125 MCG (5000 UT) CAPS Take 1 capsule by mouth daily.    [provider]  clotrimazole-betamethasone (LOTRISONE) cream Apply to affected area 2-3 times daily x 2 weeks. 04/04/20   Zehr, Laban Emperor, PA-C  cyclobenzaprine (FLEXERIL) 10 MG tablet Take 1 tablet (10 mg total) by mouth at bedtime. 04/26/20   Melynda Ripple, MD  dicyclomine (BENTYL) 20 MG tablet Take 1 tablet (20 mg total) by mouth 3 (three) times daily as needed for spasms. 04/22/20   Davonna Belling, MD  DULoxetine (CYMBALTA) 20 MG capsule Take one tablet daily x1 week. Then  increase to 2 tablets x1 week. Then 3 tablets daily on week three. 05/09/20   Nicolette Bang, DO  erythromycin ophthalmic ointment 1 cm ribbon to affected eyelid qid x 10 days 04/26/20   Melynda Ripple, MD  Ferrous Sulfate (IRON) 28 MG TABS Take 1 tablet by mouth daily.    [provider]  metroNIDAZOLE (METROGEL VAGINAL) 0.75 % vaginal gel Place 1 Applicatorful vaginally 2 (two) times daily. 05/15/20   Nicolette Bang, DO  metroNIDAZOLE (METROGEL) 0.75 % gel Apply 1 application topically daily.    [provider]  mirtazapine (REMERON) 15 MG tablet Take 2 tablets (30 mg total) by mouth at bedtime. 04/26/20   Zehr, Laban Emperor, PA-C  ondansetron (ZOFRAN-ODT) 8 MG disintegrating tablet Take 1 tablet (8 mg total) by mouth every 8 (eight) hours as needed for nausea or vomiting. 04/21/20   Jaynee Eagles, PA-C  pantoprazole (PROTONIX) 40 MG tablet Take 1 tablet (40 mg total) by mouth daily. 04/26/20   Zehr, Laban Emperor, PA-C    Family History Family History  Problem Relation Age of Onset  . Hypertension Mother   . Hypertension Father   . Breast cancer Maternal Grandmother        38  . Colon cancer Neg Hx   . Colon polyps Neg Hx   . Kidney disease Neg Hx   . Diabetes Neg Hx   . Esophageal cancer Neg Hx   . Gallbladder disease Neg Hx   . Heart disease Neg Hx   . Asthma Neg Hx   . Stroke Neg Hx   . Stomach cancer Neg Hx     Social History Social History   Tobacco Use  . Smoking status: Never Smoker  . Smokeless tobacco: Never Used  Vaping Use  . Vaping Use: Never used  Substance Use Topics  . Alcohol use: No  . Drug use: No     Allergies   Macrobid [nitrofurantoin]   Review of Systems Review of Systems  Constitutional: Positive for activity change, appetite change, chills, fatigue and fever.  HENT: Negative for congestion, ear discharge, ear pain, mouth sores, postnasal drip, rhinorrhea, sinus pressure, sinus pain, sneezing, sore throat and  trouble swallowing.   Eyes: Negative for pain, discharge, redness and itching.  Respiratory: Negative for cough, chest tightness and wheezing.   Gastrointestinal: Positive for abdominal pain and nausea. Negative for vomiting.  Genitourinary: Positive for decreased urine volume, flank pain, frequency, pelvic pain and vaginal discharge. Negative for dysuria, hematuria and vaginal bleeding.  Musculoskeletal: Positive for arthralgias, back pain, myalgias and neck pain.  Skin: Negative for color change and rash.  Allergic/Immunologic: Negative for environmental allergies and  food allergies.  Neurological: Positive for headaches. Negative for dizziness, seizures, syncope, weakness and numbness.  Hematological: Negative for adenopathy. Does not bruise/bleed easily.     Physical Exam Triage Vital Signs ED Triage Vitals  Enc Vitals Group     BP 05/16/20 1513 (!) 145/77     Pulse Rate 05/16/20 1513 79     Resp 05/16/20 1513 18     Temp 05/16/20 1513 100.2 F (37.9 C)     Temp Source 05/16/20 1513 Oral     SpO2 05/16/20 1513 97 %     Weight --      Height --      Head Circumference --      Peak Flow --      Pain Score 05/16/20 1512 0     Pain Loc --      Pain Edu? --      Excl. in [place]? --    No data found.  Updated Vital Signs BP (!) 145/77 (BP Location: Left Arm)   Pulse 79   Temp 100.2 F (37.9 C) (Oral)   Resp 18   LMP 05/08/2020 (Approximate)   SpO2 97%   Visual Acuity Right Eye Distance:   Left Eye Distance:   Bilateral Distance:    Right Eye Near:   Left Eye Near:    Bilateral Near:     Physical Exam Vitals and nursing note reviewed.  Constitutional:      General: She is awake. She is not in acute distress.    Appearance: She is well-developed and well-groomed.     Comments: She is sitting comfortably on the exam table in no acute distress but appears tired.   HENT:     Head: Normocephalic and atraumatic.     Right Ear: Hearing normal.     Left Ear: Hearing  normal.     Nose: Nose normal.     Right Sinus: No maxillary sinus tenderness or frontal sinus tenderness.     Left Sinus: No maxillary sinus tenderness or frontal sinus tenderness.     Mouth/Throat:     Lips: Pink.     Mouth: Mucous membranes are moist.     Pharynx: Oropharynx is clear. Uvula midline. No pharyngeal swelling, oropharyngeal exudate, posterior oropharyngeal erythema or uvula swelling.  Eyes:     Extraocular Movements: Extraocular movements intact.     Conjunctiva/sclera: Conjunctivae normal.  Cardiovascular:     Rate and Rhythm: Normal rate and regular rhythm.     Heart sounds: Normal heart sounds. No murmur heard.   Pulmonary:     Effort: Pulmonary effort is normal. No respiratory distress.     Breath sounds: Normal breath sounds and air entry. No decreased air movement. No decreased breath sounds, wheezing, rhonchi or rales.  Abdominal:     General: Bowel sounds are normal. There is no distension.     Palpations: Abdomen is soft. There is no hepatomegaly or splenomegaly.     Tenderness: There is generalized abdominal tenderness. There is right CVA tenderness and left CVA tenderness. There is no guarding or rebound.     Comments: Left more than right CVA tenderness.   Musculoskeletal:        General: Normal range of motion.     Cervical back: Normal range of motion and neck supple.  Lymphadenopathy:     Cervical: No cervical adenopathy.  Skin:    General: Skin is warm and dry.  Neurological:     General: No focal deficit  present.     Mental Status: She is alert and oriented to person, place, and time.  Psychiatric:        Attention and Perception: Attention normal.        Mood and Affect: Mood normal.        Speech: Speech normal.        Behavior: Behavior is cooperative.        Thought Content: Thought content normal.     Comments: Patient appears somewhat confused and having difficulty remembering what medications were given to her on what dates.        UC Treatments / Results  Labs (all labs ordered are listed, but only abnormal results are displayed) Labs Reviewed  POCT URINALYSIS DIPSTICK, ED / UC - Abnormal; Notable for the following components:      Result Value   Hgb urine dipstick TRACE (*)    All other components within normal limits  URINE CULTURE    EKG   Radiology No results found.  Procedures Procedures (including critical care time)  Medications Ordered in UC Medications - No data to display  Initial Impression / Assessment and Plan / UC Course  I have reviewed the triage vital signs and the nursing notes.  Pertinent labs & imaging results that were available during my care of the patient were reviewed by me and considered in my medical decision making (see chart for details).    Reviewed urinalysis results with patient. Still slight blood present. Last bleeding with menses was 6 days ago. Urine sent for culture. Since she continues to have urinary frequency, more CVA tenderness and now has developed a low grade fever, will treat for possible urinary infection. Recommend start Bactrim DS 1 tablet twice a day for 3 days. With regard to Cymbalta and increase in body aches and nausea, recommend decrease back to 1 tablet daily and discuss changing medication with PCP. Recommend finish Metrogel for recent positive BV. Did not feel the need to repeat flu and COVID 19 testing at this time. Continue to push fluids to stay well hydrated.  Patient had called her PCP for an appointment today but they were unable to see her until tomorrow so she came to Urgent Care. Did not know that she kept her appointment with her PCP for tomorrow until being discharged. Note written for work. Follow-up with her PCP tomorrow and pending urine culture results.   Final Clinical Impressions(s) / UC Diagnoses   Final diagnoses:  Fatigue, unspecified type  Generalized abdominal pain  Nausea without vomiting  Medication side effect,  initial encounter  Urinary frequency     Discharge Instructions     Since you are now having a low grade fever, continued urinary frequency and lower abdominal pain, recommend start Bactrim antibiotic 1 tablet twice a day for 3 days. May change medication pending urine culture results. Decrease Cymbalta to 1 tablet daily. Recommend call [facility] today to schedule appointment for further evaluation and follow-up.    ED Prescriptions    Medication Sig Dispense Auth. Provider   sulfamethoxazole-trimethoprim (BACTRIM DS) 800-160 MG tablet Take 1 tablet by mouth 2 (two) times daily for 3 days. 6 tablet Sherri [name], Sherri Stairs, NP     PDMP not reviewed this encounter.   Katy Apo, NP 05/16/20 2226

## 2020-05-16 NOTE — Telephone Encounter (Signed)
Pt called with fever and weak and dizzy. No other symptoms. Pt wants to be seen.Offer scheduled virtual tomorrow.

## 2020-05-17 ENCOUNTER — Telehealth (INDEPENDENT_AMBULATORY_CARE_PROVIDER_SITE_OTHER): Payer: Self-pay | Admitting: Internal Medicine

## 2020-05-17 ENCOUNTER — Telehealth: Payer: Self-pay | Admitting: Internal Medicine

## 2020-05-17 DIAGNOSIS — M797 Fibromyalgia: Secondary | ICD-10-CM

## 2020-05-17 DIAGNOSIS — T887XXA Unspecified adverse effect of drug or medicament, initial encounter: Secondary | ICD-10-CM

## 2020-05-17 MED ORDER — MILNACIPRAN HCL 12.5 & 25 & 50 MG PO MISC
ORAL | 0 refills | Status: DC
Start: 2020-05-17 — End: 2020-05-21

## 2020-05-17 NOTE — Progress Notes (Signed)
Virtual Visit via Telephone Note  I connected with Sherri [name], on 05/17/2020 at 11:16 AM by telephone due to the COVID-19 pandemic and verified that I am speaking with the correct person using two identifiers.   Consent: I discussed the limitations, risks, security and privacy concerns of performing an evaluation and management service by telephone and the availability of in person appointments. I also discussed with the patient that there may be a patient responsible charge related to this service. The patient expressed understanding and agreed to proceed.   Location of Patient: Home   Location of Provider: Clinic    Persons participating in Telemedicine visit: Bryleigh A Monger Cyndie Mull Dr. Juleen China      History of Present Illness: Patient has a visit for concerns. She went to Urgent Care on 3/17 for multitude of concerns. She had low grade temperature and urinary symptoms. She was started on Bactrim and urine culture was sent. She was advised to go back down to one tablet of Cymbalta from two. At time were trying to titrate for fibromyalgia symptoms. She would like to try something other than Cymbalta.    Depression screen [facility] 2/9 05/17/2020 05/09/2020 03/21/2020  Decreased Interest 0 0 0  Down, Depressed, Hopeless 3 0 0  PHQ - 2 Score 3 0 0  Altered sleeping 3 0 -  Tired, decreased energy 0 0 -  Change in appetite 0 0 -  Feeling bad or failure about yourself  0 0 -  Trouble concentrating 0 0 -  Moving slowly or fidgety/restless 0 0 -  Suicidal thoughts 0 0 -  PHQ-9 Score 6 0 -  Some recent data might be hidden    Past Medical History:  Diagnosis Date  . Fibromyalgia    denies today  . Genital herpes   . Hypertension    gestational  . 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 Urinary tract infection   . Uterine fibroid    Allergies  Allergen Reactions  . Macrobid [Nitrofurantoin] Nausea And Vomiting    Current Outpatient  Medications on File Prior to Visit  Medication Sig Dispense Refill  . Cholecalciferol (VITAMIN D3) 125 MCG (5000 UT) CAPS Take 1 capsule by mouth daily.    . clotrimazole-betamethasone (LOTRISONE) cream Apply to affected area 2-3 times daily x 2 weeks. 30 g 0  . cyclobenzaprine (FLEXERIL) 10 MG tablet Take 1 tablet (10 mg total) by mouth at bedtime. 20 tablet 0  . dicyclomine (BENTYL) 20 MG tablet Take 1 tablet (20 mg total) by mouth 3 (three) times daily as needed for spasms. 10 tablet 0  . DULoxetine (CYMBALTA) 20 MG capsule Take one tablet daily x1 week. Then increase to 2 tablets x1 week. Then 3 tablets daily on week three. 180 capsule 0  . erythromycin ophthalmic ointment 1 cm ribbon to affected eyelid qid x 10 days 5 g 0  . Ferrous Sulfate (IRON) 28 MG TABS Take 1 tablet by mouth daily.    . metroNIDAZOLE (METROGEL VAGINAL) 0.75 % vaginal gel Place 1 Applicatorful vaginally 2 (two) times daily. 70 g 0  . metroNIDAZOLE (METROGEL) 0.75 % gel Apply 1 application topically daily.    . mirtazapine (REMERON) 15 MG tablet Take 2 tablets (30 mg total) by mouth at bedtime. 30 tablet 1  . ondansetron (ZOFRAN-ODT) 8 MG disintegrating tablet Take 1 tablet (8 mg total) by mouth every 8 (eight) hours as needed for nausea or vomiting. 20 tablet 0  .  pantoprazole (PROTONIX) 40 MG tablet Take 1 tablet (40 mg total) by mouth daily. 30 tablet 5  . sulfamethoxazole-trimethoprim (BACTRIM DS) 800-160 MG tablet Take 1 tablet by mouth 2 (two) times daily for 3 days. 6 tablet 0  . acetaminophen (TYLENOL) 500 MG tablet Take 2 tablets (1,000 mg total) by mouth every 6 (six) hours as needed for moderate pain, fever or headache. 30 tablet 0  . azelastine (ASTELIN) 0.1 % nasal spray Place 2 sprays into both nostrils 2 (two) times daily. 30 mL 0   No current facility-administered medications on file prior to visit.    Observations/Objective: NAD. Speaking clearly.  Work of breathing normal.  Alert and oriented. Mood  appropriate.   Assessment and Plan: 1. Fibromyalgia Will attempt to start Milnacipran in place of Cymbalta for Fibromyalgia especially given less potential side effects of hot flashes. Discussed how to take medication.  - Milnacipran HCl 12.5 & 25 & 50 MG MISC; Take 12.5 mg on day 1. Take 12.5 mg BID days 2-3. Take 25 mg BID days 4-7. Then take 50 mg BID daily thereafter.  Dispense: 55 each; Refill: 0  2. Drug side effects Discussed that Cymbalta can commonly cause hot flashes as a side effect. Will d/c due to patient preference.    Follow Up Instructions: PRN and for routine medical care     I discussed the assessment and treatment plan with the patient. The patient was provided an opportunity to ask questions and all were answered. The patient agreed with the plan and demonstrated an understanding of the instructions.   The patient was advised to call back or seek an in-person evaluation if the symptoms worsen or if the condition fails to improve as anticipated.     I provided 12 minutes total of non-face-to-face time during this encounter including median intraservice time, reviewing previous notes, investigations, ordering medications, medical decision making, coordinating care and patient verbalized understanding at the end of the visit.    Phill Myron, D.O. Primary Care at [facility]  05/17/2020, 11:16 AM

## 2020-05-17 NOTE — Telephone Encounter (Signed)
Pt states [place] does not have the medication & [facility] cost $400.  Pt asking if there is a cheaper brand or any discount programs for [employer].

## 2020-05-17 NOTE — Progress Notes (Signed)
Feels hot after taking medication: duloxetine

## 2020-05-18 LAB — URINE CULTURE: Special Requests: NORMAL

## 2020-05-18 NOTE — Telephone Encounter (Signed)
I do not know of any discount programs. We will likely have to try a different medication all together.   Phill Myron, D.O. Primary Care at [facility]  05/18/2020, 12:38 AM

## 2020-05-20 NOTE — Telephone Encounter (Signed)
Called pt. She is agreeable to trying a different medication so she does not have to pay so much.

## 2020-05-21 ENCOUNTER — Other Ambulatory Visit: Payer: Self-pay | Admitting: Internal Medicine

## 2020-05-21 MED ORDER — AMITRIPTYLINE HCL 10 MG PO TABS: ORAL_TABLET | ORAL | 1 refills | Status: DC

## 2020-05-21 MED FILL — AMITRIPTYLINE HCL 10 MG TAB: 10 | 30 days supply | Qty: 60 | Fill #0

## 2020-05-21 NOTE — Telephone Encounter (Signed)
I have sent in Amitriptyline.   Phill Myron, D.O. Primary Care at [facility]  05/21/2020, 10:22 AM

## 2020-05-22 ENCOUNTER — Other Ambulatory Visit: Payer: Self-pay | Admitting: Nurse Practitioner

## 2020-05-23 NOTE — Telephone Encounter (Signed)
Pt informed & pt verbalized understanding without complaint.

## 2020-05-24 ENCOUNTER — Ambulatory Visit
Admission: EM | Admit: 2020-05-24 | Discharge: 2020-05-24 | Disposition: A | Discharge status: Home / Self Care | Payer: Self-pay | Attending: Emergency Medicine | Admitting: Emergency Medicine

## 2020-05-24 ENCOUNTER — Other Ambulatory Visit: Payer: Self-pay

## 2020-05-24 DIAGNOSIS — R5383 Other fatigue: Secondary | ICD-10-CM | POA: Insufficient documentation

## 2020-05-24 LAB — POCT URINALYSIS DIP (MANUAL ENTRY)
Bilirubin, UA: NEGATIVE
Blood, UA: NEGATIVE
Glucose, UA: NEGATIVE mg/dL
Leukocytes, UA: NEGATIVE
Nitrite, UA: NEGATIVE
Protein Ur, POC: NEGATIVE mg/dL
Spec Grav, UA: >=1.03 — AB (ref 1.010–1.025)
Urobilinogen, UA: 0.2 E.U./dL
pH, UA: 6 (ref 5.0–8.0)

## 2020-05-24 LAB — POCT URINE PREGNANCY: Preg Test, Ur: NEGATIVE

## 2020-05-24 NOTE — Discharge Instructions (Addendum)
Your urine does not show signs of infection.  Increase your water intake to at least 64 fluid ounces daily.    Your vaginal tests are pending.  If your test results are positive, we will call you.  You and your sexual partner(s) may require treatment at that time.  Do not have sexual activity for at least 7 days.    Follow-up with your primary care provider.

## 2020-05-24 NOTE — ED Triage Notes (Signed)
Pt presents with complaints of fatigue and generalized weakness x 1 week. Pt complains of pain in her back and shoulders. Reports movement and laying down makes the pain worse. Pt denies otc treatment at home.

## 2020-05-24 NOTE — ED Provider Notes (Signed)
[facility]    CSN: [id number] Arrival date & time: 05/24/20  1323      History   Chief Complaint Chief Complaint  Patient presents with  . Fatigue    HPI Sherri [name] is a 35 y.o. female.   Patient presents with ongoing fatigue and generalized weakness for several weeks.  She has been seen at urgent care and by her PCP for this.  She also reports dysuria, abdominal pain, back pain, and vaginal irritation.  She denies fever, rash, vomiting, diarrhea, constipation, or other symptoms.  She states she has been unable to work this entire week because of the fatigue and weakness.  Patient was seen at [facility] urgent care on 05/16/2020; diagnosed with fatigue, generalized abdominal pain, nausea, urinary frequency; treated with Bactrim; Cymbalta decreased; instructed to follow-up with PCP.  Urine culture had multiple species and recollection suggested.   Includes fibromyalgia, hypertension, uterine fibroid, HSV, chronic nausea, chronic constipation, GERD.  The history is provided by the patient and medical records.    Past Medical History:  Diagnosis Date  . Fibromyalgia    denies today  . Genital herpes   . Hypertension    gestational  . 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 Urinary tract infection   . Uterine fibroid     Patient Active Problem List   Diagnosis Date Noted  . Gastroesophageal reflux disease 04/26/2020  . Perianal rash 03/26/2020  . Chronic nausea 03/26/2020  . Chronic constipation 03/09/2019  . Pelvic pain in female 03/09/2019  . Hypertension   . Genital herpes   . Status post cesarean delivery 06/06/2014  . Uterine fibroid 12/25/2013  . Obesity 12/25/2013    Past Surgical History:  Procedure Laterality Date  . CESAREAN SECTION N/A 06/05/2014   Procedure: CESAREAN SECTION;  Surgeon: Truett Mainland, DO;  Location: [facility];  Service: Obstetrics;  Laterality: N/A;  . WISDOM TOOTH EXTRACTION      OB History    Gravida   1   Para  1   Term      Preterm  1   AB      Living  2     SAB      IAB      Ectopic      Multiple  1   Live Births  2            Home Medications    Prior to Admission medications   Medication Sig Start Date End Date Taking? Authorizing Provider  acetaminophen (TYLENOL) 500 MG tablet Take 2 tablets (1,000 mg total) by mouth every 6 (six) hours as needed for moderate pain, fever or headache. 04/30/20   Domenic Moras, PA-C  amitriptyline (ELAVIL) 10 MG tablet Take 1 tablet 1-3 hours prior to bedtime. Increase to two tablets qhs in two weeks if tolerating. 05/21/20   Nicolette Bang, DO  azelastine (ASTELIN) 0.1 % nasal spray Place 2 sprays into both nostrils 2 (two) times daily. 02/10/20   Ok Edwards, PA-C  Cholecalciferol (VITAMIN D3) 125 MCG (5000 UT) CAPS Take 1 capsule by mouth daily.    [provider]  clotrimazole-betamethasone (LOTRISONE) cream Apply to affected area 2-3 times daily x 2 weeks. 04/04/20   Zehr, Laban Emperor, PA-C  cyclobenzaprine (FLEXERIL) 10 MG tablet Take 1 tablet (10 mg total) by mouth at bedtime. 04/26/20   Melynda Ripple, MD  dicyclomine (BENTYL) 20 MG tablet Take 1 tablet (20 mg total) by  mouth 3 (three) times daily as needed for spasms. 04/22/20   Davonna Belling, MD  erythromycin ophthalmic ointment 1 cm ribbon to affected eyelid qid x 10 days 04/26/20   Melynda Ripple, MD  Ferrous Sulfate (IRON) 28 MG TABS Take 1 tablet by mouth daily.    [provider]  metroNIDAZOLE (METROGEL VAGINAL) 0.75 % vaginal gel Place 1 Applicatorful vaginally 2 (two) times daily. 05/15/20   Nicolette Bang, DO  metroNIDAZOLE (METROGEL) 0.75 % gel Apply 1 application topically daily.    [provider]  mirtazapine (REMERON) 15 MG tablet Take 2 tablets (30 mg total) by mouth at bedtime. 04/26/20   Zehr, Laban Emperor, PA-C  ondansetron (ZOFRAN-ODT) 8 MG disintegrating tablet Take 1 tablet (8 mg total) by mouth every 8 (eight)  hours as needed for nausea or vomiting. 04/21/20   Jaynee Eagles, PA-C  pantoprazole (PROTONIX) 40 MG tablet Take 1 tablet (40 mg total) by mouth daily. 04/26/20   Zehr, Laban Emperor, PA-C    Family History Family History  Problem Relation Age of Onset  . Hypertension Mother   . Hypertension Father   . Breast cancer Maternal Grandmother        76  . Colon cancer Neg Hx   . Colon polyps Neg Hx   . Kidney disease Neg Hx   . Diabetes Neg Hx   . Esophageal cancer Neg Hx   . Gallbladder disease Neg Hx   . Heart disease Neg Hx   . Asthma Neg Hx   . Stroke Neg Hx   . Stomach cancer Neg Hx     Social History Social History   Tobacco Use  . Smoking status: Never Smoker  . Smokeless tobacco: Never Used  Vaping Use  . Vaping Use: Never used  Substance Use Topics  . Alcohol use: No  . Drug use: No     Allergies   Macrobid [nitrofurantoin]   Review of Systems Review of Systems  Constitutional: Positive for fatigue. Negative for chills and fever.  HENT: Negative for ear pain and sore throat.   Eyes: Negative for pain and visual disturbance.  Respiratory: Negative for cough and shortness of breath.   Cardiovascular: Negative for chest pain and palpitations.  Gastrointestinal: Positive for abdominal pain. Negative for diarrhea, nausea and vomiting.  Genitourinary: Positive for dysuria. Negative for hematuria, pelvic pain and vaginal discharge.  Musculoskeletal: Negative for arthralgias and back pain.  Skin: Negative for color change and rash.  Neurological: Negative for seizures and syncope.  All other systems reviewed and are negative.    Physical Exam Triage Vital Signs ED Triage Vitals [05/24/20 1355]  Enc Vitals Group     BP      Pulse      Resp      Temp      Temp src      SpO2      Weight      Height      Head Circumference      Peak Flow      Pain Score 8     Pain Loc      Pain Edu?      Excl. in [place]?    No data found.  Updated Vital Signs BP 130/84    Pulse 71   Temp 99.2 F (37.3 C)   Resp 19   LMP 05/08/2020 (Approximate)   SpO2 97%   Visual Acuity Right Eye Distance:   Left Eye Distance:   Bilateral  Distance:    Right Eye Near:   Left Eye Near:    Bilateral Near:     Physical Exam Vitals and nursing note reviewed.  Constitutional:      General: She is not in acute distress.    Appearance: She is well-developed. She is not ill-appearing.  HENT:     Head: Normocephalic and atraumatic.     Mouth/Throat:     Mouth: Mucous membranes are moist.  Eyes:     Conjunctiva/sclera: Conjunctivae normal.  Cardiovascular:     Rate and Rhythm: Normal rate and regular rhythm.     Heart sounds: Normal heart sounds.  Pulmonary:     Effort: Pulmonary effort is normal. No respiratory distress.     Breath sounds: Normal breath sounds.  Abdominal:     General: Bowel sounds are normal.     Palpations: Abdomen is soft.     Tenderness: There is generalized abdominal tenderness. There is no right CVA tenderness, left CVA tenderness, guarding or rebound.  Musculoskeletal:     Cervical back: Neck supple.  Skin:    General: Skin is warm and dry.  Neurological:     General: No focal deficit present.     Mental Status: She is alert and oriented to person, place, and time.     Gait: Gait normal.  Psychiatric:        Mood and Affect: Mood normal.        Behavior: Behavior normal.      UC Treatments / Results  Labs (all labs ordered are listed, but only abnormal results are displayed) Labs Reviewed  POCT URINALYSIS DIP (MANUAL ENTRY) - Abnormal; Notable for the following components:      Result Value   Ketones, POC UA trace (5) (*)    Spec Grav, UA >=1.030 (*)    All other components within normal limits  POCT URINE PREGNANCY  CERVICOVAGINAL ANCILLARY ONLY    EKG   Radiology No results found.  Procedures Procedures (including critical care time)  Medications Ordered in UC Medications - No data to display  Initial  Impression / Assessment and Plan / UC Course  I have reviewed the triage vital signs and the nursing notes.  Pertinent labs & imaging results that were available during my care of the patient were reviewed by me and considered in my medical decision making (see chart for details).   Fatigue.  Patient is well-appearing and her exam is reassuring.  Urine does not show signs of infection.  Discussed that she needs to increase her water intake to at least 64 fluid ounces daily.  Patient obtained vaginal self swab for STD testing.  Instructed her to abstain from sexual activity until the test result is back.  Instructed her to follow-up with her primary care provider.  She agrees to plan of care.   Final Clinical Impressions(s) / UC Diagnoses   Final diagnoses:  Fatigue, unspecified type     Discharge Instructions     Your urine does not show signs of infection.  Increase your water intake to at least 64 fluid ounces daily.    Your vaginal tests are pending.  If your test results are positive, we will call you.  You and your sexual partner(s) may require treatment at that time.  Do not have sexual activity for at least 7 days.    Follow-up with your primary care provider.        ED Prescriptions    None  I have reviewed the PDMP during this encounter.   Sharion Balloon, NP 05/24/20 1501

## 2020-05-27 LAB — CERVICOVAGINAL ANCILLARY ONLY
Bacterial Vaginitis (gardnerella): NEGATIVE
Candida Glabrata: NEGATIVE
Candida Vaginitis: POSITIVE — AB
Chlamydia: NEGATIVE
Comment: NEGATIVE
Comment: NEGATIVE
Comment: NEGATIVE
Comment: NEGATIVE
Comment: NEGATIVE
Comment: NORMAL
Neisseria Gonorrhea: NEGATIVE
Trichomonas: NEGATIVE

## 2020-05-28 ENCOUNTER — Other Ambulatory Visit (HOSPITAL_COMMUNITY): Payer: Self-pay | Admitting: Internal Medicine

## 2020-05-28 ENCOUNTER — Telehealth (HOSPITAL_COMMUNITY): Payer: Self-pay | Admitting: Emergency Medicine

## 2020-05-28 MED ORDER — FLUCONAZOLE 150 MG PO TABS: 150.0000 mg | ORAL_TABLET | Freq: Once | ORAL | 0 refills | Status: DC

## 2020-05-28 MED FILL — FLUCONAZOLE 150 MG TABLET: 150 | 4 days supply | Qty: 2 | Fill #0

## 2020-05-29 ENCOUNTER — Encounter: Payer: Self-pay | Admitting: Internal Medicine

## 2020-05-31 ENCOUNTER — Other Ambulatory Visit: Payer: Self-pay | Admitting: Nurse Practitioner

## 2020-05-31 MED FILL — AMITRIPTYLINE HCL 10 MG TAB: 10 | 30 days supply | Qty: 60 | Fill #0

## 2020-06-01 ENCOUNTER — Other Ambulatory Visit: Payer: Self-pay

## 2020-06-01 MED FILL — Ondansetron Orally Disintegrating Tab 4 MG: ORAL | 3 days supply | Qty: 10 | Fill #0 | Status: CN

## 2020-06-02 ENCOUNTER — Other Ambulatory Visit: Payer: Self-pay

## 2020-06-05 ENCOUNTER — Ambulatory Visit: Payer: Self-pay | Admitting: Gastroenterology

## 2020-06-09 ENCOUNTER — Encounter (HOSPITAL_COMMUNITY): Payer: Self-pay | Admitting: *Deleted

## 2020-06-09 ENCOUNTER — Other Ambulatory Visit: Payer: Self-pay

## 2020-06-09 ENCOUNTER — Ambulatory Visit (HOSPITAL_COMMUNITY)
Admission: EM | Admit: 2020-06-09 | Discharge: 2020-06-09 | Disposition: A | Discharge status: Home / Self Care | Payer: Self-pay | Attending: Internal Medicine | Admitting: Internal Medicine

## 2020-06-09 DIAGNOSIS — Z3202 Encounter for pregnancy test, result negative: Secondary | ICD-10-CM

## 2020-06-09 DIAGNOSIS — R110 Nausea: Secondary | ICD-10-CM | POA: Insufficient documentation

## 2020-06-09 DIAGNOSIS — R112 Nausea with vomiting, unspecified: Secondary | ICD-10-CM

## 2020-06-09 DIAGNOSIS — R197 Diarrhea, unspecified: Secondary | ICD-10-CM | POA: Insufficient documentation

## 2020-06-09 DIAGNOSIS — R1084 Generalized abdominal pain: Secondary | ICD-10-CM | POA: Insufficient documentation

## 2020-06-09 DIAGNOSIS — Z881 Allergy status to other antibiotic agents status: Secondary | ICD-10-CM | POA: Insufficient documentation

## 2020-06-09 DIAGNOSIS — Z20822 Contact with and (suspected) exposure to covid-19: Secondary | ICD-10-CM | POA: Insufficient documentation

## 2020-06-09 DIAGNOSIS — R5381 Other malaise: Secondary | ICD-10-CM | POA: Insufficient documentation

## 2020-06-09 DIAGNOSIS — R5383 Other fatigue: Secondary | ICD-10-CM | POA: Insufficient documentation

## 2020-06-09 DIAGNOSIS — R52 Pain, unspecified: Secondary | ICD-10-CM

## 2020-06-09 DIAGNOSIS — Z79899 Other long term (current) drug therapy: Secondary | ICD-10-CM | POA: Insufficient documentation

## 2020-06-09 LAB — CBC WITH DIFFERENTIAL/PLATELET
Abs Immature Granulocytes: 0.03 10*3/uL (ref 0.00–0.07)
Basophils Absolute: 0.1 10*3/uL (ref 0.0–0.1)
Basophils Relative: 1 %
Eosinophils Absolute: 0.3 10*3/uL (ref 0.0–0.5)
Eosinophils Relative: 4 %
HCT: 39.9 % (ref 36.0–46.0)
Hemoglobin: 12.9 g/dL (ref 12.0–15.0)
Immature Granulocytes: 0 %
Lymphocytes Relative: 29 %
Lymphs Abs: 2 10*3/uL (ref 0.7–4.0)
MCH: 29 pg (ref 26.0–34.0)
MCHC: 32.3 g/dL (ref 30.0–36.0)
MCV: 89.7 fL (ref 80.0–100.0)
Monocytes Absolute: 0.5 10*3/uL (ref 0.1–1.0)
Monocytes Relative: 7 %
Neutro Abs: 4.1 10*3/uL (ref 1.7–7.7)
Neutrophils Relative %: 59 %
Platelets: 262 10*3/uL (ref 150–400)
RBC: 4.45 MIL/uL (ref 3.87–5.11)
RDW: 14.6 % (ref 11.5–15.5)
WBC: 7 10*3/uL (ref 4.0–10.5)
nRBC: 0 % (ref 0.0–0.2)

## 2020-06-09 LAB — POCT URINALYSIS DIPSTICK, ED / UC
Bilirubin Urine: NEGATIVE
Glucose, UA: NEGATIVE mg/dL
Ketones, ur: NEGATIVE mg/dL
Leukocytes,Ua: NEGATIVE
Nitrite: NEGATIVE
Protein, ur: NEGATIVE mg/dL
Specific Gravity, Urine: 1.025 (ref 1.005–1.030)
Urobilinogen, UA: 0.2 mg/dL (ref 0.0–1.0)
pH: 5 (ref 5.0–8.0)

## 2020-06-09 LAB — COMPREHENSIVE METABOLIC PANEL
ALT: 21 U/L (ref 0–44)
AST: 19 U/L (ref 15–41)
Albumin: 3.8 g/dL (ref 3.5–5.0)
Alkaline Phosphatase: 76 U/L (ref 38–126)
Anion gap: 5 (ref 5–15)
BUN: 8 mg/dL (ref 6–20)
CO2: 26 mmol/L (ref 22–32)
Calcium: 9.2 mg/dL (ref 8.9–10.3)
Chloride: 106 mmol/L (ref 98–111)
Creatinine, Ser: 0.77 mg/dL (ref 0.44–1.00)
GFR, Estimated: >60 mL/min (ref >60)
Glucose, Bld: 102 mg/dL — ABNORMAL HIGH (ref 70–99)
Potassium: 3.8 mmol/L (ref 3.5–5.1)
Sodium: 137 mmol/L (ref 135–145)
Total Bilirubin: 0.5 mg/dL (ref 0.3–1.2)
Total Protein: 7.1 g/dL (ref 6.5–8.1)

## 2020-06-09 LAB — POC URINE PREG, ED: Preg Test, Ur: NEGATIVE

## 2020-06-09 LAB — SARS CORONAVIRUS 2 (TAT 6-24 HRS): SARS Coronavirus 2: NEGATIVE

## 2020-06-09 MED ORDER — ONDANSETRON HCL 4 MG PO TABS: 4.0000 mg | ORAL_TABLET | Freq: Three times a day (TID) | ORAL | 0 refills | Status: DC | PRN

## 2020-06-09 NOTE — ED Triage Notes (Signed)
Pt reports for past 3 days she has had nausea,fatigue ,generalized .

## 2020-06-09 NOTE — Discharge Instructions (Addendum)
I sent in Zofran to help manage her nausea symptoms.  Please eat a bland diet (brat diet such as bananas, rice, applesauce and toast).  It is important you drink plenty of fluid including something with electrolytes.  If you have any worsening symptoms you to go to the hospital for further evaluation.  We will be in touch with your results soon as you have to.  Follow-up with PCP for repeat urine and further evaluation.

## 2020-06-09 NOTE — ED Provider Notes (Signed)
[facility]    CSN: [id number] Arrival date & time: 06/09/20  1006      History   Chief Complaint Chief Complaint  Patient presents with  . Nausea  . Fatigue  . Generalized Body Aches  . Abdominal Pain    HPI Sherri [name] is a 35 y.o. female.   Patient presents today with a 3-day history of GI symptoms.  Reports diarrhea described as going 5-6 times per day which are watery without blood or mucus.  She reports significant nausea but denies any vomiting.  She is not concerned for pregnancy as she recently came off of her menstrual cycle.  She reports associated generalized abdominal pain rated 8 on a 0-10 pain scale, described as cramping, no aggravating alleviating factors identified.  She continues to have loose bowel movements with last bowel movement earlier today.  She denies any suspicious food intake, recent antibiotic use, medication changes, recent travel.  She denies history of abdominal surgery outside of cesarean section several years ago.  She reports some body aches, fatigue, general malaise, sinus pressure.  She is up-to-date on COVID-19 vaccination including booster.  She denies any known sick contacts.  She denies additional symptoms including chest pain, shortness of breath, urinary symptoms, vaginal complaints.  Symptoms are interfering with her ability to perform daily activities.     Past Medical History:  Diagnosis Date  . Fibromyalgia    denies today  . Genital herpes   . Hypertension    gestational  . 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 Urinary tract infection   . Uterine fibroid     Patient Active Problem List   Diagnosis Date Noted  . Gastroesophageal reflux disease 04/26/2020  . Perianal rash 03/26/2020  . Chronic nausea 03/26/2020  . Chronic constipation 03/09/2019  . Pelvic pain in female 03/09/2019  . Hypertension   . Genital herpes   . Status post cesarean delivery 06/06/2014  . Uterine fibroid  12/25/2013  . Obesity 12/25/2013    Past Surgical History:  Procedure Laterality Date  . CESAREAN SECTION N/A 06/05/2014   Procedure: CESAREAN SECTION;  Surgeon: Truett Mainland, DO;  Location: [facility];  Service: Obstetrics;  Laterality: N/A;  . WISDOM TOOTH EXTRACTION      OB History    Gravida  1   Para  1   Term      Preterm  1   AB      Living  2     SAB      IAB      Ectopic      Multiple  1   Live Births  2            Home Medications    Prior to Admission medications   Medication Sig Start Date End Date Taking? Authorizing Provider  ondansetron (ZOFRAN) 4 MG tablet Take 1 tablet (4 mg total) by mouth every 8 (eight) hours as needed for nausea or vomiting. 06/09/20  Yes Tracye Szuch, Derry Skill, PA-C  acetaminophen (TYLENOL) 500 MG tablet TAKE 2 TABLETS (1,000 MG TOTAL) BY MOUTH EVERY 6 (SIX) HOURS AS NEEDED FOR MODERATE PAIN, FEVER OR HEADACHE. 04/30/20 04/30/21  Domenic Moras, PA-C  amitriptyline (ELAVIL) 10 MG tablet TAKE 1 TABLET 1-3 HOURS PRIOR TO BEDTIME. INCREASE TO TWO TABLETS EVERY NIGHT AT BEDTIME IN TWO WEEKS IF TOLERATING. 05/21/20 05/21/21  Nicolette Bang, DO  azelastine (ASTELIN) 0.1 % nasal spray Place 2 sprays into both nostrils  2 (two) times daily. 02/10/20   Ok Edwards, PA-C  Cholecalciferol (VITAMIN D3) 125 MCG (5000 UT) CAPS Take 1 capsule by mouth daily.    [provider]  clotrimazole-betamethasone (LOTRISONE) cream APPLY TO AFFECTED AREA 2-3 TIMES DAILY FOR 2 WEEKS. 04/04/20 04/04/21  Zehr, Janett Billow D, PA-C  cyclobenzaprine (FLEXERIL) 10 MG tablet TAKE 1 TABLET (10 MG TOTAL) BY MOUTH AT BEDTIME. 04/26/20 04/26/21  Melynda Ripple, MD  dicyclomine (BENTYL) 20 MG tablet TAKE 1 TABLET (20 MG TOTAL) BY MOUTH 3 (THREE) TIMES DAILY AS NEEDED FOR SPASMS. 04/22/20 04/22/21  Davonna Belling, MD  erythromycin ophthalmic ointment APPLY 1 CM RIBBON TO AFFECTED EYELID 4 TIMES DAILY FOR 10 DAYS 04/26/20 04/26/21  Melynda Ripple, MD  Ferrous Sulfate  (IRON) 28 MG TABS Take 1 tablet by mouth daily.    [provider]  fluconazole (DIFLUCAN) 150 MG tablet TAKE 1 TABLET (150 MG TOTAL) BY MOUTH ONCE FOR 1 DOSE. REPEAT IN 3 DAYS IF SYMPTOMS PERSIST 05/28/20 05/28/21  Lamptey, Myrene Galas, MD  metroNIDAZOLE (FLAGYL) 500 MG tablet TAKE 1 TABLET (500 MG TOTAL) BY MOUTH 2 (TWO) TIMES DAILY FOR 7 DAYS. 04/26/20 04/26/21  Melynda Ripple, MD  metroNIDAZOLE (METROGEL) 0.75 % gel Apply 1 application topically daily.    [provider]  metroNIDAZOLE (METROGEL) 0.75 % vaginal gel PLACE 1 APPLICATORFUL VAGINALLY 2 (TWO) TIMES DAILY. 05/15/20 05/15/21  Nicolette Bang, DO  metroNIDAZOLE (METROGEL) 0.75 % vaginal gel PLACE 1 APPLICATORFUL VAGINALLY AT BEDTIME FOR 5 DAYS. 02/22/20 02/21/21  Chase Picket, MD  mirtazapine (REMERON) 15 MG tablet TAKE 2 TABLETS (30 MG TOTAL) BY MOUTH AT BEDTIME. 04/26/20 04/26/21  Zehr, Janett Billow D, PA-C  pantoprazole (PROTONIX) 40 MG tablet TAKE 1 TABLET (40 MG TOTAL) BY MOUTH DAILY. 04/26/20 04/26/21  Zehr, Laban Emperor, PA-C  sulfamethoxazole-trimethoprim (BACTRIM DS) 800-160 MG tablet TAKE 1 TABLET BY MOUTH 2 (TWO) TIMES DAILY FOR 3 DAYS. 05/16/20 05/16/21  Katy Apo, NP  DULoxetine (CYMBALTA) 20 MG capsule Take one tablet daily x1 week. Then increase to 2 tablets x1 week. Then 3 tablets daily on week three. 05/09/20 05/17/20  Nicolette Bang, DO    Family History Family History  Problem Relation Age of Onset  . Hypertension Mother   . Hypertension Father   . Breast cancer Maternal Grandmother        72  . Colon cancer Neg Hx   . Colon polyps Neg Hx   . Kidney disease Neg Hx   . Diabetes Neg Hx   . Esophageal cancer Neg Hx   . Gallbladder disease Neg Hx   . Heart disease Neg Hx   . Asthma Neg Hx   . Stroke Neg Hx   . Stomach cancer Neg Hx     Social History Social History   Tobacco Use  . Smoking status: Never Smoker  . Smokeless tobacco: Never Used  Vaping Use  . Vaping Use:  Never used  Substance Use Topics  . Alcohol use: No  . Drug use: No     Allergies   Macrobid [nitrofurantoin]   Review of Systems Review of Systems  Constitutional: Positive for activity change, appetite change and fatigue. Negative for fever.  HENT: Positive for sinus pressure. Negative for congestion, sneezing and sore throat.   Respiratory: Negative for cough and shortness of breath.   Cardiovascular: Negative for chest pain.  Gastrointestinal: Positive for abdominal pain, diarrhea and nausea. Negative for vomiting.  Musculoskeletal: Positive for arthralgias and  myalgias.  Neurological: Positive for headaches. Negative for dizziness and light-headedness.     Physical Exam Triage Vital Signs ED Triage Vitals  Enc Vitals Group     BP 06/09/20 1026 140/89     Pulse Rate 06/09/20 1026 89     Resp 06/09/20 1026 20     Temp 06/09/20 1026 98.4 F (36.9 C)     Temp Source 06/09/20 1026 Oral     SpO2 06/09/20 1026 100 %     Weight --      Height --      Head Circumference --      Peak Flow --      Pain Score 06/09/20 1028 9     Pain Loc --      Pain Edu? --      Excl. in [place]? --    No data found.  Updated Vital Signs BP 140/89 (BP Location: Left Arm)   Pulse 89   Temp 98.4 F (36.9 C) (Oral)   Resp 20   LMP 06/08/2020   SpO2 100%   Visual Acuity Right Eye Distance:   Left Eye Distance:   Bilateral Distance:    Right Eye Near:   Left Eye Near:    Bilateral Near:     Physical Exam Vitals reviewed.  Constitutional:      General: She is awake. She is not in acute distress.    Appearance: Normal appearance. She is not ill-appearing.     Comments: Very pleasant female appears stated age in no acute distress  HENT:     Head: Normocephalic and atraumatic.     Right Ear: Tympanic membrane, ear canal and external ear normal.     Left Ear: Tympanic membrane, ear canal and external ear normal.     Nose: No congestion.     Right Sinus: Maxillary sinus tenderness  present. No frontal sinus tenderness.     Left Sinus: Maxillary sinus tenderness present. No frontal sinus tenderness.     Mouth/Throat:     Pharynx: Uvula midline. No oropharyngeal exudate or posterior oropharyngeal erythema.  Cardiovascular:     Rate and Rhythm: Normal rate and regular rhythm.     Heart sounds: No murmur heard.   Pulmonary:     Effort: Pulmonary effort is normal.     Breath sounds: Normal breath sounds. No wheezing, rhonchi or rales.     Comments: Clear to auscultation bilaterally Abdominal:     General: Bowel sounds are normal.     Palpations: Abdomen is soft.     Tenderness: There is generalized abdominal tenderness. There is no right CVA tenderness, left CVA tenderness, guarding or rebound.     Comments: Tenderness to palpation throughout abdomen.  No evidence of acute abdomen on physical exam.  Musculoskeletal:     Right lower leg: No edema.     Left lower leg: No edema.  Psychiatric:        Behavior: Behavior is cooperative.      UC Treatments / Results  Labs (all labs ordered are listed, but only abnormal results are displayed) Labs Reviewed  POCT URINALYSIS DIPSTICK, ED / UC - Abnormal; Notable for the following components:      Result Value   Hgb urine dipstick TRACE (*)    All other components within normal limits  SARS CORONAVIRUS 2 (TAT 6-24 HRS)  CBC WITH DIFFERENTIAL/PLATELET  COMPREHENSIVE METABOLIC PANEL  POC URINE PREG, ED    EKG   Radiology No results found.  Procedures Procedures (including critical care time)  Medications Ordered in UC Medications - No data to display  Initial Impression / Assessment and Plan / UC Course  I have reviewed the triage vital signs and the nursing notes.  Pertinent labs & imaging results that were available during my care of the patient were reviewed by me and considered in my medical decision making (see chart for details).     UA showed trace hemoglobin likely related to patient coming off  of her menstrual cycle.  Urine pregnancy was negative in office.  Basic labs obtained today-results pending.  Will test patient for Covid given several days of GI symptoms-results pending.  Discussed the importance of eating a bland diet and drinking plenty of fluid.  No evidence of acute abdomen that would warrant emergent evaluation but discussed if she has any worsening symptoms she is to go to the emergency room for further evaluation to which she expressed understanding.  Strict return precautions given to which patient expressed understanding.  Final Clinical Impressions(s) / UC Diagnoses   Final diagnoses:  Nausea vomiting and diarrhea  Generalized abdominal pain  Body aches     Discharge Instructions     I sent in Zofran to help manage her nausea symptoms.  Please eat a bland diet (brat diet such as bananas, rice, applesauce and toast).  It is important you drink plenty of fluid including something with electrolytes.  If you have any worsening symptoms you to go to the hospital for further evaluation.  We will be in touch with your results soon as you have to.  Follow-up with PCP for repeat urine and further evaluation.    ED Prescriptions    Medication Sig Dispense Auth. Provider   ondansetron (ZOFRAN) 4 MG tablet Take 1 tablet (4 mg total) by mouth every 8 (eight) hours as needed for nausea or vomiting. 20 tablet Edward Guthmiller, Derry Skill, PA-C     PDMP not reviewed this encounter.   Terrilee Croak, PA-C 06/09/20 1114

## 2020-06-10 ENCOUNTER — Other Ambulatory Visit: Payer: Self-pay

## 2020-06-10 NOTE — Progress Notes (Deleted)
Patient ID: MATA ROWEN, female    DOB: 09-05-1985  MRN: [id number]  CC: Annual Physical Exam  Subjective: Sherri [name] is a 35 y.o. female who presents for annual physical exam.  Her concerns today include:   Patient Active Problem List   Diagnosis Date Noted  . Gastroesophageal reflux disease 04/26/2020  . Perianal rash 03/26/2020  . Chronic nausea 03/26/2020  . Chronic constipation 03/09/2019  . Pelvic pain in female 03/09/2019  . Hypertension   . Genital herpes   . Status post cesarean delivery 06/06/2014  . Uterine fibroid 12/25/2013  . Obesity 12/25/2013     Current Outpatient Medications on File Prior to Visit  Medication Sig Dispense Refill  . acetaminophen (TYLENOL) 500 MG tablet TAKE 2 TABLETS (1,000 MG TOTAL) BY MOUTH EVERY 6 (SIX) HOURS AS NEEDED FOR MODERATE PAIN, FEVER OR HEADACHE. 30 tablet 0  . amitriptyline (ELAVIL) 10 MG tablet TAKE 1 TABLET 1-3 HOURS PRIOR TO BEDTIME. INCREASE TO TWO TABLETS EVERY NIGHT AT BEDTIME IN TWO WEEKS IF TOLERATING. 90 tablet 1  . azelastine (ASTELIN) 0.1 % nasal spray Place 2 sprays into both nostrils 2 (two) times daily. 30 mL 0  . Cholecalciferol (VITAMIN D3) 125 MCG (5000 UT) CAPS Take 1 capsule by mouth daily.    . clotrimazole-betamethasone (LOTRISONE) cream APPLY TO AFFECTED AREA 2-3 TIMES DAILY FOR 2 WEEKS. 30 g 0  . cyclobenzaprine (FLEXERIL) 10 MG tablet TAKE 1 TABLET (10 MG TOTAL) BY MOUTH AT BEDTIME. 20 tablet 0  . dicyclomine (BENTYL) 20 MG tablet TAKE 1 TABLET (20 MG TOTAL) BY MOUTH 3 (THREE) TIMES DAILY AS NEEDED FOR SPASMS. 10 tablet 0  . erythromycin ophthalmic ointment APPLY 1 CM RIBBON TO AFFECTED EYELID 4 TIMES DAILY FOR 10 DAYS 7 g 0  . Ferrous Sulfate (IRON) 28 MG TABS Take 1 tablet by mouth daily.    . fluconazole (DIFLUCAN) 150 MG tablet TAKE 1 TABLET (150 MG TOTAL) BY MOUTH ONCE FOR 1 DOSE. REPEAT IN 3 DAYS IF SYMPTOMS PERSIST 2 tablet 0  . metroNIDAZOLE (FLAGYL) 500 MG tablet TAKE 1 TABLET (500 MG  TOTAL) BY MOUTH 2 (TWO) TIMES DAILY FOR 7 DAYS. 14 tablet 0  . metroNIDAZOLE (METROGEL) 0.75 % gel Apply 1 application topically daily.    . metroNIDAZOLE (METROGEL) 0.75 % vaginal gel PLACE 1 APPLICATORFUL VAGINALLY 2 (TWO) TIMES DAILY. 70 g 0  . metroNIDAZOLE (METROGEL) 0.75 % vaginal gel PLACE 1 APPLICATORFUL VAGINALLY AT BEDTIME FOR 5 DAYS. 70 g 0  . mirtazapine (REMERON) 15 MG tablet TAKE 2 TABLETS (30 MG TOTAL) BY MOUTH AT BEDTIME. 30 tablet 1  . ondansetron (ZOFRAN) 4 MG tablet Take 1 tablet (4 mg total) by mouth every 8 (eight) hours as needed for nausea or vomiting. 20 tablet 0  . pantoprazole (PROTONIX) 40 MG tablet TAKE 1 TABLET (40 MG TOTAL) BY MOUTH DAILY. 30 tablet 5  . sulfamethoxazole-trimethoprim (BACTRIM DS) 800-160 MG tablet TAKE 1 TABLET BY MOUTH 2 (TWO) TIMES DAILY FOR 3 DAYS. 6 tablet 0  . [DISCONTINUED] DULoxetine (CYMBALTA) 20 MG capsule Take one tablet daily x1 week. Then increase to 2 tablets x1 week. Then 3 tablets daily on week three. 180 capsule 0   No current facility-administered medications on file prior to visit.    Allergies  Allergen Reactions  . Macrobid [Nitrofurantoin] Nausea And Vomiting    Social History   Socioeconomic History  . Marital status: Single    Spouse name: Not on file  .  Number of children: Not on file  . Years of education: Not on file  . Highest education level: Not on file  Occupational History  . Occupation: [employer]  Tobacco Use  . Smoking status: Never Smoker  . Smokeless tobacco: Never Used  Vaping Use  . Vaping Use: Never used  Substance and Sexual Activity  . Alcohol use: No  . Drug use: No  . Sexual activity: Yes    Birth control/protection: None  Other Topics Concern  . Not on file  Social History Narrative  . Not on file   Social Determinants of Health   Financial Resource Strain: Not on file  Food Insecurity: Not on file  Transportation Needs: Not on file  Physical Activity: Not on file  Stress: Not on  file  Social Connections: Not on file  Intimate Partner Violence: Not on file    Family History  Problem Relation Age of Onset  . Hypertension Mother   . Hypertension Father   . Breast cancer Maternal Grandmother        63  . Colon cancer Neg Hx   . Colon polyps Neg Hx   . Kidney disease Neg Hx   . Diabetes Neg Hx   . Esophageal cancer Neg Hx   . Gallbladder disease Neg Hx   . Heart disease Neg Hx   . Asthma Neg Hx   . Stroke Neg Hx   . Stomach cancer Neg Hx     Past Surgical History:  Procedure Laterality Date  . CESAREAN SECTION N/A 06/05/2014   Procedure: CESAREAN SECTION;  Surgeon: Truett Mainland, DO;  Location: [facility];  Service: Obstetrics;  Laterality: N/A;  . WISDOM TOOTH EXTRACTION      ROS: Review of Systems Negative except as stated above  PHYSICAL EXAM: LMP 06/08/2020   Physical Exam  {female adult master:310786} {female adult master:310785}  CMP Latest Ref Rng & Units 06/09/2020 04/30/2020 04/22/2020  Glucose 70 - 99 mg/dL 102(H) 99 96  BUN 6 - 20 mg/dL 8 11 10   Creatinine 0.44 - 1.00 mg/dL 0.77 0.88 0.91  Sodium 135 - 145 mmol/L 137 137 139  Potassium 3.5 - 5.1 mmol/L 3.8 3.6 3.4(L)  Chloride 98 - 111 mmol/L 106 106 105  CO2 22 - 32 mmol/L 26 19(L) 25  Calcium 8.9 - 10.3 mg/dL 9.2 9.8 9.3  Total Protein 6.5 - 8.1 g/dL 7.1 - 7.4  Total Bilirubin 0.3 - 1.2 mg/dL 0.5 - 0.6  Alkaline Phos 38 - 126 U/L 76 - 67  AST 15 - 41 U/L 19 - 30  ALT 0 - 44 U/L 21 - 32   Lipid Panel     Component Value Date/Time   CHOL 198 01/07/2017 1419   TRIG 57 01/07/2017 1419   HDL 56 01/07/2017 1419   CHOLHDL 3.5 01/07/2017 1419   LDLCALC 128 (H) 01/07/2017 1419    CBC    Component Value Date/Time   WBC 7.0 06/09/2020 1056   RBC 4.45 06/09/2020 1056   HGB 12.9 06/09/2020 1056   HGB 11.2 12/11/2019 1133   HCT 39.9 06/09/2020 1056   HCT 35.6 12/11/2019 1133   PLT 262 06/09/2020 1056   PLT 327 12/11/2019 1133   MCV 89.7 06/09/2020 1056   MCV 84 12/11/2019 1133    MCH 29.0 06/09/2020 1056   MCHC 32.3 06/09/2020 1056   RDW 14.6 06/09/2020 1056   RDW 17.5 (H) 12/11/2019 1133   LYMPHSABS 2.0 06/09/2020 1056   LYMPHSABS 2.8  07/14/2019 0950   MONOABS 0.5 06/09/2020 1056   EOSABS 0.3 06/09/2020 1056   EOSABS 0.2 07/14/2019 0950   BASOSABS 0.1 06/09/2020 1056   BASOSABS 0.0 07/14/2019 0950    ASSESSMENT AND PLAN:  There are no diagnoses linked to this encounter.   Patient was given the opportunity to ask questions.  Patient verbalized understanding of the plan and was able to repeat key elements of the plan. Patient was given clear instructions to go to Emergency Department or return to medical center if symptoms don't improve, worsen, or new problems develop.The patient verbalized understanding.   No orders of the defined types were placed in this encounter.    Requested Prescriptions    No prescriptions requested or ordered in this encounter    No follow-ups on file.  Camillia Herter, NP

## 2020-06-12 ENCOUNTER — Encounter: Payer: Self-pay | Admitting: Family

## 2020-06-18 ENCOUNTER — Other Ambulatory Visit: Payer: Self-pay

## 2020-06-18 ENCOUNTER — Other Ambulatory Visit: Payer: Self-pay | Admitting: Nurse Practitioner

## 2020-06-25 ENCOUNTER — Ambulatory Visit: Payer: Self-pay | Admitting: Gastroenterology

## 2020-06-26 ENCOUNTER — Other Ambulatory Visit: Payer: Self-pay

## 2020-07-03 ENCOUNTER — Encounter: Payer: Self-pay | Admitting: Internal Medicine

## 2020-07-03 ENCOUNTER — Ambulatory Visit (INDEPENDENT_AMBULATORY_CARE_PROVIDER_SITE_OTHER): Payer: Self-pay | Admitting: Internal Medicine

## 2020-07-03 ENCOUNTER — Other Ambulatory Visit (HOSPITAL_COMMUNITY)
Admission: RE | Admit: 2020-07-03 | Discharge: 2020-07-03 | Disposition: A | Discharge status: Home / Self Care | Payer: Self-pay | Source: Ambulatory Visit | Attending: Internal Medicine | Admitting: Internal Medicine

## 2020-07-03 ENCOUNTER — Other Ambulatory Visit: Payer: Self-pay

## 2020-07-03 VITALS — BP 131/82 | HR 88 | Resp 16 | Ht 58.5 in | Wt 195.4 lb

## 2020-07-03 DIAGNOSIS — Z0000 Encounter for general adult medical examination without abnormal findings: Secondary | ICD-10-CM

## 2020-07-03 DIAGNOSIS — Z1329 Encounter for screening for other suspected endocrine disorder: Secondary | ICD-10-CM

## 2020-07-03 DIAGNOSIS — Z124 Encounter for screening for malignant neoplasm of cervix: Secondary | ICD-10-CM | POA: Insufficient documentation

## 2020-07-03 DIAGNOSIS — Z131 Encounter for screening for diabetes mellitus: Secondary | ICD-10-CM

## 2020-07-03 DIAGNOSIS — Z13228 Encounter for screening for other metabolic disorders: Secondary | ICD-10-CM

## 2020-07-03 DIAGNOSIS — Z113 Encounter for screening for infections with a predominantly sexual mode of transmission: Secondary | ICD-10-CM

## 2020-07-03 MED ORDER — ONDANSETRON HCL 4 MG PO TABS
4.0000 mg | ORAL_TABLET | Freq: Three times a day (TID) | ORAL | 3 refills | Status: DC | PRN
  Filled 2020-07-03: qty 20, 7d supply, fill #0
  Filled 2020-07-19: qty 20, 7d supply, fill #1
  Filled 2020-08-16 – 2020-09-11 (×3): qty 20, 7d supply, fill #2
  Filled 2020-10-18: qty 20, 7d supply, fill #3

## 2020-07-03 MED FILL — Amitriptyline HCl Tab 10 MG: ORAL | 30 days supply | Qty: 60 | Fill #0 | Status: AC

## 2020-07-03 MED FILL — Pantoprazole Sodium EC Tab 40 MG (Base Equiv): ORAL | 30 days supply | Qty: 30 | Fill #0 | Status: AC

## 2020-07-03 NOTE — Progress Notes (Signed)
Subjective:    Sherri [name] - 35 y.o. female MRN [id number]  Date of birth: [birth year]  HPI  Sherri [name] is here for annual exam. She is taking the Amitriptyline for presumed fibromyalgia. She is taking two tablets. Experiencing some sedation with this. Discussed could reduce back to one tablet for a few weeks to help mitigate side effects.    Health Maintenance:  Health Maintenance Due  Topic Date Due  . PAP SMEAR-Modifier  07/17/2017  . COVID-19 Vaccine (2 - Booster for Janssen series) 08/07/2019    -  reports that she has never smoked. She has never used smokeless tobacco. - Review of Systems: Per HPI. - Past Medical History: Patient Active Problem List   Diagnosis Date Noted  . Gastroesophageal reflux disease 04/26/2020  . Perianal rash 03/26/2020  . Chronic nausea 03/26/2020  . Chronic constipation 03/09/2019  . Pelvic pain in female 03/09/2019  . Hypertension   . Genital herpes   . Status post cesarean delivery 06/06/2014  . Uterine fibroid 12/25/2013  . Obesity 12/25/2013   - Medications: reviewed and updated   Objective:   Physical Exam BP 131/82   Pulse 88   Resp 16   Ht 4' 10.5" (1.486 m)   Wt 195 lb 6.4 oz (88.6 kg)   LMP 06/08/2020   SpO2 98%   BMI 40.14 kg/m  Physical Exam Constitutional:      Appearance: She is not diaphoretic.  HENT:     Head: Normocephalic and atraumatic.  Eyes:     Conjunctiva/sclera: Conjunctivae normal.     Pupils: Pupils are equal, round, and reactive to light.  Neck:     Thyroid: No thyromegaly.  Cardiovascular:     Rate and Rhythm: Normal rate and regular rhythm.     Heart sounds: Normal heart sounds. No murmur heard.   Pulmonary:     Effort: Pulmonary effort is normal. No respiratory distress.     Breath sounds: Normal breath sounds. No wheezing.  Abdominal:     General: Bowel sounds are normal. There is no distension.     Palpations: Abdomen is soft.     Tenderness: There is no abdominal tenderness.  There is no guarding or rebound.  Genitourinary:    Comments: GU/GYN: Exam performed in the presence of a chaperone. External genitalia within normal limits.  Vaginal mucosa pink, moist, normal rugae.  Nonfriable cervix without lesions, no discharge or bleeding noted on speculum exam.  Bimanual exam revealed normal, nongravid uterus.  No cervical motion tenderness. No adnexal masses bilaterally.    Musculoskeletal:        General: No deformity. Normal range of motion.     Cervical back: Normal range of motion and neck supple.  Lymphadenopathy:     Cervical: No cervical adenopathy.  Skin:    General: Skin is warm and dry.     Findings: No rash.  Neurological:     Mental Status: She is alert and oriented to person, place, and time.     Gait: Gait is intact.  Psychiatric:        Mood and Affect: Mood and affect normal.        Judgment: Judgment normal.            Assessment & Plan:   1. Encounter for annual physical exam Counseled on 150 minutes of exercise per week, healthy eating (including decreased daily intake of saturated fats, cholesterol, added sugars, sodium), STI prevention, routine healthcare maintenance.  2. Screening  for cervical cancer - Cytology - PAP([place])  3. Screening for thyroid disorder - TSH+T4F+T3Free  4. Screening for metabolic disorder - Hemoglobin A1c - Lipid panel  5. Screening for diabetes mellitus - Hemoglobin A1c  6. Screening for STD (sexually transmitted disease) - Cervicovaginal ancillary only   Phill Myron, D.O. 07/03/2020, 1:52 PM Primary Care at [facility]

## 2020-07-04 LAB — CERVICOVAGINAL ANCILLARY ONLY
Bacterial Vaginitis (gardnerella): POSITIVE — AB
Candida Glabrata: NEGATIVE
Candida Vaginitis: NEGATIVE
Chlamydia: NEGATIVE
Comment: NEGATIVE
Comment: NEGATIVE
Comment: NEGATIVE
Comment: NEGATIVE
Comment: NEGATIVE
Comment: NORMAL
Neisseria Gonorrhea: NEGATIVE
Trichomonas: NEGATIVE

## 2020-07-04 LAB — CYTOLOGY - PAP
Comment: NEGATIVE
Diagnosis: NEGATIVE
High risk HPV: NEGATIVE

## 2020-07-04 LAB — LIPID PANEL
Chol/HDL Ratio: 4.8 ratio — ABNORMAL HIGH (ref 0.0–4.4)
Cholesterol, Total: 252 mg/dL — ABNORMAL HIGH (ref 100–199)
HDL: 52 mg/dL (ref >39)
LDL Chol Calc (NIH): 177 mg/dL — ABNORMAL HIGH (ref 0–99)
Triglycerides: 129 mg/dL (ref 0–149)
VLDL Cholesterol Cal: 23 mg/dL (ref 5–40)

## 2020-07-04 LAB — TSH+T4F+T3FREE
Free T4: 1.14 ng/dL (ref 0.82–1.77)
T3, Free: 3.2 pg/mL (ref 2.0–4.4)
TSH: 1.01 u[IU]/mL (ref 0.450–4.500)

## 2020-07-04 LAB — HEMOGLOBIN A1C
Est. average glucose Bld gHb Est-mCnc: 123 mg/dL
Hgb A1c MFr Bld: 5.9 % — ABNORMAL HIGH (ref 4.8–5.6)

## 2020-07-05 ENCOUNTER — Other Ambulatory Visit: Payer: Self-pay | Admitting: Internal Medicine

## 2020-07-05 ENCOUNTER — Other Ambulatory Visit: Payer: Self-pay

## 2020-07-05 ENCOUNTER — Telehealth: Payer: Self-pay | Admitting: Internal Medicine

## 2020-07-05 ENCOUNTER — Encounter: Payer: Self-pay | Admitting: Internal Medicine

## 2020-07-05 DIAGNOSIS — R7303 Prediabetes: Secondary | ICD-10-CM | POA: Insufficient documentation

## 2020-07-05 MED ORDER — METRONIDAZOLE 500 MG PO TABS
500.0000 mg | ORAL_TABLET | Freq: Two times a day (BID) | ORAL | 0 refills | Status: DC
  Filled 2020-07-05: qty 14, 7d supply, fill #0

## 2020-07-05 NOTE — Telephone Encounter (Signed)
Pt called in stating she can not take metroNIDAZOLE (FLAGYL) 500 MG tablet because tablets make her sick. Pt is asking if vaginal gel can be called in instead. Pharmacy  [facility] [address], [place] [address]  Phone:  [phone] Fax:  [phone]  DEA #:  [id number]

## 2020-07-09 ENCOUNTER — Other Ambulatory Visit: Payer: Self-pay | Admitting: Internal Medicine

## 2020-07-09 ENCOUNTER — Other Ambulatory Visit: Payer: Self-pay

## 2020-07-09 MED ORDER — METRONIDAZOLE 0.75 % VA GEL
1.0000 | Freq: Every day | VAGINAL | 0 refills | Status: DC
  Filled 2020-07-09 – 2020-07-19 (×2): qty 70, 7d supply, fill #0

## 2020-07-09 NOTE — Telephone Encounter (Signed)
Sent in [place].   Phill Myron, D.O. Primary Care at [facility]  07/09/2020, 9:49 AM

## 2020-07-12 ENCOUNTER — Other Ambulatory Visit: Payer: Self-pay

## 2020-07-16 ENCOUNTER — Ambulatory Visit: Payer: Self-pay | Admitting: Gastroenterology

## 2020-07-16 ENCOUNTER — Other Ambulatory Visit: Payer: Self-pay

## 2020-07-19 ENCOUNTER — Other Ambulatory Visit: Payer: Self-pay

## 2020-07-28 ENCOUNTER — Ambulatory Visit (HOSPITAL_COMMUNITY)
Admission: EM | Admit: 2020-07-28 | Discharge: 2020-07-28 | Disposition: A | Discharge status: Home / Self Care | Payer: Self-pay | Attending: Student | Admitting: Student

## 2020-07-28 ENCOUNTER — Encounter (HOSPITAL_COMMUNITY): Payer: Self-pay

## 2020-07-28 DIAGNOSIS — J069 Acute upper respiratory infection, unspecified: Secondary | ICD-10-CM | POA: Insufficient documentation

## 2020-07-28 DIAGNOSIS — Z1152 Encounter for screening for COVID-19: Secondary | ICD-10-CM | POA: Insufficient documentation

## 2020-07-28 LAB — SARS CORONAVIRUS 2 (TAT 6-24 HRS): SARS Coronavirus 2: POSITIVE — AB

## 2020-07-28 MED ORDER — METHYLPREDNISOLONE SODIUM SUCC 125 MG IJ SOLR
INTRAMUSCULAR | Status: AC
  Filled 2020-07-28: qty 2

## 2020-07-28 MED ORDER — METHYLPREDNISOLONE SODIUM SUCC 125 MG IJ SOLR
60.0000 mg | Freq: Once | INTRAMUSCULAR | Status: AC
  Administered 2020-07-28: 60 mg via INTRAMUSCULAR

## 2020-07-28 MED ORDER — PROMETHAZINE-DM 6.25-15 MG/5ML PO SYRP: 5.0000 mL | ORAL_SOLUTION | Freq: Four times a day (QID) | ORAL | 0 refills | Status: DC | PRN

## 2020-07-28 MED ORDER — BENZONATATE 100 MG PO CAPS: 100.0000 mg | ORAL_CAPSULE | Freq: Three times a day (TID) | ORAL | 0 refills | Status: DC

## 2020-07-28 NOTE — ED Provider Notes (Signed)
[facility]    CSN: [id number] Arrival date & time: 07/28/20  1000      History   Chief Complaint Chief Complaint  Patient presents with  . Sore Throat  . Nasal Congestion    HPI Sherri [name] is a 35 y.o. female presenting with sore throat and congestion x3 days.  Medical history fibromyalgia, hypertension (gestational), uterine fibroid, UTI.  States she has had a hacking nonproductive cough for 3 days.  Also sore throat, body aches.  Subjective chills but has not checked her temperature.  Has tried over-the-counter remedies like hot tea, ginger.  Denies history of pulmonary disease. Denies n/v/d, shortness of breath, chest pain, facial pain, teeth pain, headaches,  loss of taste/smell, swollen lymph nodes, ear pain.    HPI  Past Medical History:  Diagnosis Date  . Fibromyalgia    denies today  . Genital herpes   . Hypertension    gestational  . 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 Urinary tract infection   . Uterine fibroid     Patient Active Problem List   Diagnosis Date Noted  . Prediabetes 07/05/2020  . Gastroesophageal reflux disease 04/26/2020  . Perianal rash 03/26/2020  . Chronic nausea 03/26/2020  . Chronic constipation 03/09/2019  . Pelvic pain in female 03/09/2019  . Hypertension   . Genital herpes   . Uterine fibroid 12/25/2013  . Obesity 12/25/2013    Past Surgical History:  Procedure Laterality Date  . CESAREAN SECTION N/A 06/05/2014   Procedure: CESAREAN SECTION;  Surgeon: Truett Mainland, DO;  Location: [facility];  Service: Obstetrics;  Laterality: N/A;  . WISDOM TOOTH EXTRACTION      OB History    Gravida  1   Para  1   Term      Preterm  1   AB      Living  2     SAB      IAB      Ectopic      Multiple  1   Live Births  2            Home Medications    Prior to Admission medications   Medication Sig Start Date End Date Taking? Authorizing Provider  benzonatate (TESSALON) 100  MG capsule Take 1 capsule (100 mg total) by mouth every 8 (eight) hours. 07/28/20  Yes Hazel Sams, PA-C  promethazine-dextromethorphan (PROMETHAZINE-DM) 6.25-15 MG/5ML syrup Take 5 mLs by mouth 4 (four) times daily as needed for cough. 07/28/20  Yes Hazel Sams, PA-C  acetaminophen (TYLENOL) 500 MG tablet TAKE 2 TABLETS (1,000 MG TOTAL) BY MOUTH EVERY 6 (SIX) HOURS AS NEEDED FOR MODERATE PAIN, FEVER OR HEADACHE. 04/30/20 04/30/21  Domenic Moras, PA-C  amitriptyline (ELAVIL) 10 MG tablet TAKE 1 TABLET 1-3 HOURS PRIOR TO BEDTIME. INCREASE TO TWO TABLETS EVERY NIGHT AT BEDTIME IN TWO WEEKS IF TOLERATING. 05/21/20 05/21/21  Nicolette Bang, DO  azelastine (ASTELIN) 0.1 % nasal spray Place 2 sprays into both nostrils 2 (two) times daily. 02/10/20   Ok Edwards, PA-C  Cholecalciferol (VITAMIN D3) 125 MCG (5000 UT) CAPS Take 1 capsule by mouth daily.    [provider]  clotrimazole-betamethasone (LOTRISONE) cream APPLY TO AFFECTED AREA 2-3 TIMES DAILY FOR 2 WEEKS. 04/04/20 04/04/21  Zehr, Janett Billow D, PA-C  cyclobenzaprine (FLEXERIL) 10 MG tablet TAKE 1 TABLET (10 MG TOTAL) BY MOUTH AT BEDTIME. 04/26/20 04/26/21  Melynda Ripple, MD  dicyclomine (BENTYL) 20 MG  tablet TAKE 1 TABLET (20 MG TOTAL) BY MOUTH 3 (THREE) TIMES DAILY AS NEEDED FOR SPASMS. 04/22/20 04/22/21  Davonna Belling, MD  Ferrous Sulfate (IRON) 28 MG TABS Take 1 tablet by mouth daily.    [provider]  metroNIDAZOLE (METROGEL VAGINAL) 0.75 % vaginal gel Place 1 Applicatorful vaginally at bedtime. 07/09/20   Nicolette Bang, DO  mirtazapine (REMERON) 15 MG tablet TAKE 2 TABLETS (30 MG TOTAL) BY MOUTH AT BEDTIME. 04/26/20 04/26/21  Zehr, Janett Billow D, PA-C  ondansetron (ZOFRAN) 4 MG tablet Take 1 tablet (4 mg total) by mouth every 8 (eight) hours as needed for nausea or vomiting. 07/03/20   Nicolette Bang, DO  pantoprazole (PROTONIX) 40 MG tablet TAKE 1 TABLET (40 MG TOTAL) BY MOUTH DAILY. 04/26/20 04/26/21  Zehr,  Laban Emperor, PA-C  DULoxetine (CYMBALTA) 20 MG capsule Take one tablet daily x1 week. Then increase to 2 tablets x1 week. Then 3 tablets daily on week three. 05/09/20 05/17/20  Nicolette Bang, DO    Family History Family History  Problem Relation Age of Onset  . Hypertension Mother   . Hypertension Father   . Breast cancer Maternal Grandmother        50  . Colon cancer Neg Hx   . Colon polyps Neg Hx   . Kidney disease Neg Hx   . Diabetes Neg Hx   . Esophageal cancer Neg Hx   . Gallbladder disease Neg Hx   . Heart disease Neg Hx   . Asthma Neg Hx   . Stroke Neg Hx   . Stomach cancer Neg Hx     Social History Social History   Tobacco Use  . Smoking status: Never Smoker  . Smokeless tobacco: Never Used  Vaping Use  . Vaping Use: Never used  Substance Use Topics  . Alcohol use: No  . Drug use: No     Allergies   Macrobid [nitrofurantoin]   Review of Systems Review of Systems  Constitutional: Positive for chills. Negative for appetite change and fever.  HENT: Positive for congestion and sore throat. Negative for ear pain, rhinorrhea, sinus pressure and sinus pain.   Eyes: Negative for redness and visual disturbance.  Respiratory: Positive for cough. Negative for chest tightness, shortness of breath and wheezing.   Cardiovascular: Negative for chest pain and palpitations.  Gastrointestinal: Negative for abdominal pain, constipation, diarrhea, nausea and vomiting.  Genitourinary: Negative for dysuria, frequency and urgency.  Musculoskeletal: Negative for myalgias.  Neurological: Negative for dizziness, weakness and headaches.  Psychiatric/Behavioral: Negative for confusion.  All other systems reviewed and are negative.    Physical Exam Triage Vital Signs ED Triage Vitals  Enc Vitals Group     BP      Pulse      Resp      Temp      Temp src      SpO2      Weight      Height      Head Circumference      Peak Flow      Pain Score      Pain Loc       Pain Edu?      Excl. in [place]?    No data found.  Updated Vital Signs BP 132/85 (BP Location: Left Arm)   Pulse (!) 107   Temp 99.1 F (37.3 C) (Oral)   Resp 16   LMP 07/24/2020   SpO2 99%   Visual Acuity Right Eye  Distance:   Left Eye Distance:   Bilateral Distance:    Right Eye Near:   Left Eye Near:    Bilateral Near:     Physical Exam Vitals reviewed.  Constitutional:      General: She is not in acute distress.    Appearance: Normal appearance. She is not ill-appearing.  HENT:     Head: Normocephalic and atraumatic.     Right Ear: Hearing, tympanic membrane, ear canal and external ear normal. No swelling or tenderness. There is no impacted cerumen. No mastoid tenderness. Tympanic membrane is not perforated, erythematous, retracted or bulging.     Left Ear: Hearing, tympanic membrane, ear canal and external ear normal. No swelling or tenderness. There is no impacted cerumen. No mastoid tenderness. Tympanic membrane is not perforated, erythematous, retracted or bulging.     Nose:     Right Sinus: No maxillary sinus tenderness or frontal sinus tenderness.     Left Sinus: No maxillary sinus tenderness or frontal sinus tenderness.     Mouth/Throat:     Mouth: Mucous membranes are moist.     Pharynx: Uvula midline. Posterior oropharyngeal erythema present. No oropharyngeal exudate.     Tonsils: No tonsillar exudate.     Comments: Smooth erythema posterior pharynx Cardiovascular:     Rate and Rhythm: Normal rate and regular rhythm.     Heart sounds: Normal heart sounds.  Pulmonary:     Breath sounds: Normal breath sounds and air entry. No wheezing, rhonchi or rales.  Chest:     Chest wall: No tenderness.  Abdominal:     General: Abdomen is flat. Bowel sounds are normal.     Tenderness: There is no abdominal tenderness. There is no guarding or rebound.  Lymphadenopathy:     Cervical: No cervical adenopathy.  Skin:    Capillary Refill: Capillary refill takes less than 2  seconds.  Neurological:     General: No focal deficit present.     Mental Status: She is alert and oriented to person, place, and time.  Psychiatric:        Attention and Perception: Attention and perception normal.        Mood and Affect: Mood and affect normal.        Behavior: Behavior normal. Behavior is cooperative.        Thought Content: Thought content normal.        Judgment: Judgment normal.      UC Treatments / Results  Labs (all labs ordered are listed, but only abnormal results are displayed) Labs Reviewed  SARS CORONAVIRUS 2 (TAT 6-24 HRS)    EKG   Radiology No results found.  Procedures Procedures (including critical care time)  Medications Ordered in UC Medications  methylPREDNISolone sodium succinate (SOLU-MEDROL) 125 mg/2 mL injection 60 mg (has no administration in time range)    Initial Impression / Assessment and Plan / UC Course  I have reviewed the triage vital signs and the nursing notes.  Pertinent labs & imaging results that were available during my care of the patient were reviewed by me and considered in my medical decision making (see chart for details).     This patient is a 35 year old female presenting with viral URI with cough.  She is borderline tachycardic but afebrile, nontachypneic.  Oxygenating well on room air without wheezes rhonchi or rales.  Appears well-hydrated.  COVID PCR sent.  States she could not be pregnant.  Tessalon and promethazine sent.  Patient expresses concern at  the cost of this, so also treated with IM prednisone today.   ED return precautions discussed   Final Clinical Impressions(s) / UC Diagnoses   Final diagnoses:  Viral URI with cough  Encounter for screening for COVID-19     Discharge Instructions     -Promethazine DM cough syrup for congestion/cough. This could make you drowsy, so take at night before bed. -Tessalon (Benzonatate) as needed for cough. Take one pill up to 3x daily (every 8  hours) -For fevers/chills, bodyaches, headaches- Take Tylenol 1000 mg 3 times daily, and ibuprofen 800 mg 3 times daily with food.  You can take these together, or alternate every 3-4 hours. -Drink plenty of fluids! -With a virus, you're typically contagious for 5-7 days, or as long as you're having fevers.  -Seek additional medical attention if you develop new symptoms like chest pain, shortness of breath, new wheezing, etc.     ED Prescriptions    Medication Sig Dispense Auth. Provider   promethazine-dextromethorphan (PROMETHAZINE-DM) 6.25-15 MG/5ML syrup Take 5 mLs by mouth 4 (four) times daily as needed for cough. 118 mL Hazel Sams, PA-C   benzonatate (TESSALON) 100 MG capsule Take 1 capsule (100 mg total) by mouth every 8 (eight) hours. 21 capsule Hazel Sams, PA-C     PDMP not reviewed this encounter.   Hazel Sams, PA-C 07/28/20 1053

## 2020-07-28 NOTE — ED Triage Notes (Signed)
Pt present sore throat with cough and congestion. Symptoms started three day ago. Pt states tried home remedies and otc medication with no relief.

## 2020-07-28 NOTE — Discharge Instructions (Addendum)
-  Promethazine DM cough syrup for congestion/cough. This could make you drowsy, so take at night before bed. -Tessalon (Benzonatate) as needed for cough. Take one pill up to 3x daily (every 8 hours) -For fevers/chills, bodyaches, headaches- Take Tylenol 1000 mg 3 times daily, and ibuprofen 800 mg 3 times daily with food.  You can take these together, or alternate every 3-4 hours. -Drink plenty of fluids! -With a virus, you're typically contagious for 5-7 days, or as long as you're having fevers.  -Seek additional medical attention if you develop new symptoms like chest pain, shortness of breath, new wheezing, etc.

## 2020-07-29 ENCOUNTER — Telehealth: Payer: Self-pay | Admitting: Nurse Practitioner

## 2020-07-29 NOTE — Telephone Encounter (Signed)
Called to discuss with patient about COVID-19 symptoms and the use of one of the available treatments for those with mild to moderate Covid symptoms and at a high risk of hospitalization.  Pt appears to qualify for outpatient treatment due to co-morbid conditions and/or a member of an at-risk group in accordance with the FDA Emergency Use Authorization.    Symptom onset: 07/26/20  Vaccinated: Yes J&J (2021) Booster? Not on file Immunocompromised? No  Qualifiers: obesity, htn NIH Criteria: 1  Unable to reach pt - Voicemail and Mychart message sent. Patient would be a candidate for Paxlovid, recent labs 4/22 with GFR >60.   Sherri Lea, NP [facility] [phone]

## 2020-07-31 ENCOUNTER — Other Ambulatory Visit: Payer: Self-pay

## 2020-07-31 ENCOUNTER — Telehealth: Payer: Self-pay | Admitting: Unknown Physician Specialty

## 2020-07-31 MED ORDER — NIRMATRELVIR/RITONAVIR (PAXLOVID)TABLET: 3.0000 | ORAL_TABLET | Freq: Two times a day (BID) | ORAL | 0 refills | Status: DC

## 2020-07-31 MED ORDER — NIRMATRELVIR/RITONAVIR (PAXLOVID)TABLET
3.0000 | ORAL_TABLET | Freq: Two times a day (BID) | ORAL | 0 refills | Status: AC
  Filled 2020-07-31: qty 30, 5d supply, fill #0

## 2020-07-31 NOTE — Telephone Encounter (Signed)
Outpatient Oral COVID Treatment Note  I connected with Sherri [name] on 07/31/2020/3:34 PM by telephone and verified that I am speaking with the correct person using two identifiers.  I discussed the limitations, risks, security, and privacy concerns of performing an evaluation and management service by telephone and the availability of in person appointments. I also discussed with the patient that there may be a patient responsible charge related to this service. The patient expressed understanding and agreed to proceed.  Patient location: home Provider location: home  Diagnosis: COVID-19 infection  Purpose of visit: Discussion of potential use of Molnupiravir or Paxlovid, a new treatment for mild to moderate COVID-19 viral infection in non-hospitalized patients.   Subjective: Patient is a 35 y.o. female who has been diagnosed with COVID 19 viral infection.  Their symptoms began on 5/28 with coughing, diarrhea    Past Medical History:  Diagnosis Date  . Fibromyalgia    denies today  . Genital herpes   . Hypertension    gestational  . 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 Urinary tract infection   . Uterine fibroid     Allergies  Allergen Reactions  . Macrobid [Nitrofurantoin] Nausea And Vomiting     Current Outpatient Medications:  .  acetaminophen (TYLENOL) 500 MG tablet, TAKE 2 TABLETS (1,000 MG TOTAL) BY MOUTH EVERY 6 (SIX) HOURS AS NEEDED FOR MODERATE PAIN, FEVER OR HEADACHE., Disp: 30 tablet, Rfl: 0 .  amitriptyline (ELAVIL) 10 MG tablet, TAKE 1 TABLET 1-3 HOURS PRIOR TO BEDTIME. INCREASE TO TWO TABLETS EVERY NIGHT AT BEDTIME IN TWO WEEKS IF TOLERATING., Disp: 90 tablet, Rfl: 1 .  azelastine (ASTELIN) 0.1 % nasal spray, Place 2 sprays into both nostrils 2 (two) times daily., Disp: 30 mL, Rfl: 0 .  benzonatate (TESSALON) 100 MG capsule, Take 1 capsule (100 mg total) by mouth every 8 (eight) hours., Disp: 21 capsule, Rfl: 0 .  Cholecalciferol (VITAMIN  D3) 125 MCG (5000 UT) CAPS, Take 1 capsule by mouth daily., Disp: , Rfl:  .  clotrimazole-betamethasone (LOTRISONE) cream, APPLY TO AFFECTED AREA 2-3 TIMES DAILY FOR 2 WEEKS., Disp: 30 g, Rfl: 0 .  cyclobenzaprine (FLEXERIL) 10 MG tablet, TAKE 1 TABLET (10 MG TOTAL) BY MOUTH AT BEDTIME., Disp: 20 tablet, Rfl: 0 .  dicyclomine (BENTYL) 20 MG tablet, TAKE 1 TABLET (20 MG TOTAL) BY MOUTH 3 (THREE) TIMES DAILY AS NEEDED FOR SPASMS., Disp: 10 tablet, Rfl: 0 .  Ferrous Sulfate (IRON) 28 MG TABS, Take 1 tablet by mouth daily., Disp: , Rfl:  .  metroNIDAZOLE (METROGEL VAGINAL) 0.75 % vaginal gel, Place 1 Applicatorful vaginally at bedtime., Disp: 70 g, Rfl: 0 .  mirtazapine (REMERON) 15 MG tablet, TAKE 2 TABLETS (30 MG TOTAL) BY MOUTH AT BEDTIME., Disp: 30 tablet, Rfl: 1 .  ondansetron (ZOFRAN) 4 MG tablet, Take 1 tablet (4 mg total) by mouth every 8 (eight) hours as needed for nausea or vomiting., Disp: 20 tablet, Rfl: 3 .  pantoprazole (PROTONIX) 40 MG tablet, TAKE 1 TABLET (40 MG TOTAL) BY MOUTH DAILY., Disp: 30 tablet, Rfl: 5 .  promethazine-dextromethorphan (PROMETHAZINE-DM) 6.25-15 MG/5ML syrup, Take 5 mLs by mouth 4 (four) times daily as needed for cough., Disp: 118 mL, Rfl: 0  Objective: Patient appears/sounds well.  They are in no apparent distress.  Breathing is non labored.  Mood and behavior are normal.  Laboratory Data:  Recent Results (from the past 2160 hour(s))  Cervicovaginal ancillary only     Status: Abnormal  Collection Time: 05/09/20  2:10 PM  Result Value Ref Range   Neisseria Gonorrhea Negative    Chlamydia Negative    Trichomonas Negative    Bacterial Vaginitis (gardnerella) Positive (A)    Candida Vaginitis Negative    Candida Glabrata Negative    Comment      Normal Reference Range Bacterial Vaginosis - Negative   Comment Normal Reference Range Candida Species - Negative    Comment Normal Reference Range Candida Galbrata - Negative    Comment Normal Reference Range  Trichomonas - Negative    Comment Normal Reference Ranger Chlamydia - Negative    Comment      Normal Reference Range Neisseria Gonorrhea - Negative  Urine Culture     Status: None   Collection Time: 05/09/20  2:20 PM   Specimen: Urine   Urine  Result Value Ref Range   Urine Culture, Routine Final report    Organism ID, Bacteria Comment     Comment: Mixed urogenital flora 10,000-25,000 colony forming units per mL   POCT URINALYSIS DIP (CLINITEK)     Status: Abnormal   Collection Time: 05/09/20  3:16 PM  Result Value Ref Range   Color, UA yellow yellow   Clarity, UA clear clear   Glucose, UA negative negative mg/dL   Bilirubin, UA negative negative   Ketones, POC UA negative negative mg/dL   Spec Grav, UA 1.015 1.010 - 1.025   Blood, UA large (A) negative   pH, UA 5.5 5.0 - 8.0   POC PROTEIN,UA negative negative, trace   Urobilinogen, UA 0.2 0.2 or 1.0 E.U./dL   Nitrite, UA Negative Negative   Leukocytes, UA Negative Negative  POC Urinalysis dipstick     Status: Abnormal   Collection Time: 05/16/20  4:21 PM  Result Value Ref Range   Glucose, UA NEGATIVE NEGATIVE mg/dL   Bilirubin Urine NEGATIVE NEGATIVE   Ketones, ur NEGATIVE NEGATIVE mg/dL   Specific Gravity, Urine 1.020 1.005 - 1.030   Hgb urine dipstick TRACE (A) NEGATIVE   pH 5.0 5.0 - 8.0   Protein, ur NEGATIVE NEGATIVE mg/dL   Urobilinogen, UA 0.2 0.0 - 1.0 mg/dL   Nitrite NEGATIVE NEGATIVE   Leukocytes,Ua NEGATIVE NEGATIVE    Comment: Biochemical Testing Only. Please order routine urinalysis from main lab if confirmatory testing is needed.  Urine culture     Status: Abnormal   Collection Time: 05/16/20  4:28 PM   Specimen: Urine, Clean Catch  Result Value Ref Range   Specimen Description URINE, CLEAN CATCH    Special Requests      Normal Performed at [facility], [place] [address] [place], [place] [address]    Culture MULTIPLE SPECIES PRESENT, SUGGEST RECOLLECTION (A)    Report Status 05/18/2020 FINAL    Cervicovaginal ancillary only     Status: Abnormal   Collection Time: 05/24/20  2:39 PM  Result Value Ref Range   Neisseria Gonorrhea Negative    Chlamydia Negative    Trichomonas Negative    Bacterial Vaginitis (gardnerella) Negative    Candida Vaginitis Positive (A)    Candida Glabrata Negative    Comment Normal Reference Range Candida Species - Negative    Comment Normal Reference Range Candida Galbrata - Negative    Comment Normal Reference Range Trichomonas - Negative    Comment      Normal Reference Range Bacterial Vaginosis - Negative   Comment Normal Reference Ranger Chlamydia - Negative    Comment  Normal Reference Range Neisseria Gonorrhea - Negative  POCT urinalysis dipstick     Status: Abnormal   Collection Time: 05/24/20  2:47 PM  Result Value Ref Range   Color, UA yellow yellow   Clarity, UA clear clear   Glucose, UA negative negative mg/dL   Bilirubin, UA negative negative   Ketones, POC UA trace (5) (A) negative mg/dL   Spec Grav, UA >=1.030 (A) 1.010 - 1.025   Blood, UA negative negative   pH, UA 6.0 5.0 - 8.0   Protein Ur, POC negative negative mg/dL   Urobilinogen, UA 0.2 0.2 or 1.0 E.U./dL   Nitrite, UA Negative Negative   Leukocytes, UA Negative Negative  POCT urine pregnancy     Status: None   Collection Time: 05/24/20  2:47 PM  Result Value Ref Range   Preg Test, Ur Negative Negative  POCT Urinalysis Dipstick (ED/UC)     Status: Abnormal   Collection Time: 06/09/20 10:39 AM  Result Value Ref Range   Glucose, UA NEGATIVE NEGATIVE mg/dL   Bilirubin Urine NEGATIVE NEGATIVE   Ketones, ur NEGATIVE NEGATIVE mg/dL   Specific Gravity, Urine 1.025 1.005 - 1.030   Hgb urine dipstick TRACE (A) NEGATIVE   pH 5.0 5.0 - 8.0   Protein, ur NEGATIVE NEGATIVE mg/dL   Urobilinogen, UA 0.2 0.0 - 1.0 mg/dL   Nitrite NEGATIVE NEGATIVE   Leukocytes,Ua NEGATIVE NEGATIVE    Comment: Biochemical Testing Only. Please order routine urinalysis from main lab if  confirmatory testing is needed.  POC urine preg, ED (not at [facility])     Status: None   Collection Time: 06/09/20 10:50 AM  Result Value Ref Range   Preg Test, Ur NEGATIVE NEGATIVE    Comment:        THE SENSITIVITY OF THIS METHODOLOGY IS >24 mIU/mL   CBC with Differential     Status: None   Collection Time: 06/09/20 10:56 AM  Result Value Ref Range   WBC 7.0 4.0 - 10.5 K/uL   RBC 4.45 3.87 - 5.11 MIL/uL   Hemoglobin 12.9 12.0 - 15.0 g/dL   HCT 39.9 36.0 - 46.0 %   MCV 89.7 80.0 - 100.0 fL   MCH 29.0 26.0 - 34.0 pg   MCHC 32.3 30.0 - 36.0 g/dL   RDW 14.6 11.5 - 15.5 %   Platelets 262 150 - 400 K/uL   nRBC 0.0 0.0 - 0.2 %   Neutrophils Relative % 59 %   Neutro Abs 4.1 1.7 - 7.7 K/uL   Lymphocytes Relative 29 %   Lymphs Abs 2.0 0.7 - 4.0 K/uL   Monocytes Relative 7 %   Monocytes Absolute 0.5 0.1 - 1.0 K/uL   Eosinophils Relative 4 %   Eosinophils Absolute 0.3 0.0 - 0.5 K/uL   Basophils Relative 1 %   Basophils Absolute 0.1 0.0 - 0.1 K/uL   Immature Granulocytes 0 %   Abs Immature Granulocytes 0.03 0.00 - 0.07 K/uL    Comment: Performed at [facility], [address] [place], [place] [address]  Comprehensive metabolic panel     Status: Abnormal   Collection Time: 06/09/20 10:56 AM  Result Value Ref Range   Sodium 137 135 - 145 mmol/L   Potassium 3.8 3.5 - 5.1 mmol/L   Chloride 106 98 - 111 mmol/L   CO2 26 22 - 32 mmol/L   Glucose, Bld 102 (H) 70 - 99 mg/dL    Comment: Glucose reference range applies only to samples  taken after fasting for at least 8 hours.   BUN 8 6 - 20 mg/dL   Creatinine, Ser 0.77 0.44 - 1.00 mg/dL   Calcium 9.2 8.9 - 10.3 mg/dL   Total Protein 7.1 6.5 - 8.1 g/dL   Albumin 3.8 3.5 - 5.0 g/dL   AST 19 15 - 41 U/L   ALT 21 0 - 44 U/L   Alkaline Phosphatase 76 38 - 126 U/L   Total Bilirubin 0.5 0.3 - 1.2 mg/dL   GFR, Estimated >60 >60 mL/min    Comment: (NOTE) Calculated using the CKD-EPI Creatinine Equation (2021)    Anion gap 5 5 - 15     Comment: Performed at [place] [address] [place], [place] [address]  SARS CORONAVIRUS 2 (TAT 6-24 HRS) Nasopharyngeal Nasopharyngeal Swab     Status: None   Collection Time: 06/09/20 12:40 PM   Specimen: Nasopharyngeal Swab  Result Value Ref Range   SARS Coronavirus 2 NEGATIVE NEGATIVE    Comment: (NOTE) SARS-CoV-2 target nucleic acids are NOT DETECTED.  The SARS-CoV-2 RNA is generally detectable in upper and lower respiratory specimens during the acute phase of infection. Negative results do not preclude SARS-CoV-2 infection, do not rule out co-infections with other pathogens, and should not be used as the sole basis for treatment or other patient management decisions. Negative results must be combined with clinical observations, patient history, and epidemiological information. The expected result is Negative.  Fact Sheet for Patients: SugarRoll.be  Fact Sheet for Healthcare Providers: https://www.woods-mathews.com/  This test is not yet approved or cleared by the [place] FDA and  has been authorized for detection and/or diagnosis of SARS-CoV-2 by FDA under an Emergency Use Authorization (EUA). This EUA will remain  in effect (meaning this test can be used) for the duration of the COVID-19 declaration under Se ction 564(b)(1) of the Act, 21 U.S.C. section 360bbb-3(b)(1), unless the authorization is terminated or revoked sooner.  Performed at [facility], [place] [address], [place], [place] [address]   Cytology - PAP([place])     Status: None   Collection Time: 07/03/20  1:49 PM  Result Value Ref Range   High risk HPV Negative    Adequacy      Satisfactory for evaluation; transformation zone component PRESENT.   Diagnosis      - Negative for intraepithelial lesion or malignancy (NILM)   Comment Normal Reference Range HPV - Negative   Cervicovaginal ancillary only     Status: Abnormal   Collection  Time: 07/03/20  1:59 PM  Result Value Ref Range   Neisseria Gonorrhea Negative    Chlamydia Negative    Trichomonas Negative    Bacterial Vaginitis (gardnerella) Positive (A)    Candida Vaginitis Negative    Candida Glabrata Negative    Comment      Normal Reference Range Bacterial Vaginosis - Negative   Comment Normal Reference Range Candida Species - Negative    Comment Normal Reference Range Candida Galbrata - Negative    Comment Normal Reference Range Trichomonas - Negative    Comment Normal Reference Ranger Chlamydia - Negative    Comment      Normal Reference Range Neisseria Gonorrhea - Negative  Hemoglobin A1c     Status: Abnormal   Collection Time: 07/03/20  2:09 PM  Result Value Ref Range   Hgb A1c MFr Bld 5.9 (H) 4.8 - 5.6 %    Comment:  Prediabetes: 5.7 - 6.4          Diabetes: >6.4          Glycemic control for adults with diabetes: <7.0    Est. average glucose Bld gHb Est-mCnc 123 mg/dL  Lipid panel     Status: Abnormal   Collection Time: 07/03/20  2:09 PM  Result Value Ref Range   Cholesterol, Total 252 (H) 100 - 199 mg/dL   Triglycerides 129 0 - 149 mg/dL   HDL 52 >39 mg/dL   VLDL Cholesterol Cal 23 5 - 40 mg/dL   LDL Chol Calc (NIH) 177 (H) 0 - 99 mg/dL   Chol/HDL Ratio 4.8 (H) 0.0 - 4.4 ratio    Comment:                                   T. Chol/HDL Ratio                                             Men  Women                               1/2 Avg.Risk  3.4    3.3                                   Avg.Risk  5.0    4.4                                2X Avg.Risk  9.6    7.1                                3X Avg.Risk 23.4   11.0   TSH+T4F+T3Free     Status: None   Collection Time: 07/03/20  2:09 PM  Result Value Ref Range   TSH 1.010 0.450 - 4.500 uIU/mL   T3, Free 3.2 2.0 - 4.4 pg/mL   Free T4 1.14 0.82 - 1.77 ng/dL  SARS CORONAVIRUS 2 (TAT 6-24 HRS) Nasopharyngeal Nasopharyngeal Swab     Status: Abnormal   Collection Time: 07/28/20 10:47 AM    Specimen: Nasopharyngeal Swab  Result Value Ref Range   SARS Coronavirus 2 POSITIVE (A) NEGATIVE    Comment: (NOTE) SARS-CoV-2 target nucleic acids are DETECTED.  The SARS-CoV-2 RNA is generally detectable in upper and lower respiratory specimens during the acute phase of infection. Positive results are indicative of the presence of SARS-CoV-2 RNA. Clinical correlation with patient history and other diagnostic information is  necessary to determine patient infection status. Positive results do not rule out bacterial infection or co-infection with other viruses.  The expected result is Negative.  Fact Sheet for Patients: SugarRoll.be  Fact Sheet for Healthcare Providers: https://www.woods-mathews.com/  This test is not yet approved or cleared by the [place] FDA and  has been authorized for detection and/or diagnosis of SARS-CoV-2 by FDA under an Emergency Use Authorization (EUA). This EUA will remain  in effect (meaning this test can be used) for the duration of the COVID-19 declaration under Section 564(b)(1) of the  Act, 21 U. S.C. section 360bbb-3(b)(1), unless the authorization is terminated or revoked sooner.   Performed at [facility], [place] [address], [place], [place] [address]      Assessment: 35 y.o. female with mild/moderate COVID 19 viral infection diagnosed on 5/28 at high risk for progression to severe COVID 19.  Plan:  This patient is a 35 y.o. female that meets the following criteria for Emergency Use Authorization of: Paxlovid 1. Age >12 yr AND > 40 kg 2. SARS-COV-2 positive test 3. Symptom onset < 5 days 4. Mild-to-moderate COVID disease with high risk for severe progression to hospitalization or death  I have spoken and communicated the following to the patient or parent/caregiver regarding: 1. Paxlovid is an unapproved drug that is authorized for use under an Emergency Use Authorization.  2. There are no  adequate, approved, available products for the treatment of COVID-19 in adults who have mild-to-moderate COVID-19 and are at high risk for progressing to severe COVID-19, including hospitalization or death. 3. Other therapeutics are currently authorized. For additional information on all products authorized for treatment or prevention of COVID-19, please see TanEmporium.pl.  4. There are benefits and risks of taking this treatment as outlined in the "Fact Sheet for Patients and Caregivers."  5. "Fact Sheet for Patients and Caregivers" was reviewed with patient. A hard copy will be provided to patient from pharmacy prior to the patient receiving treatment. 6. Patients should continue to self-isolate and use infection control measures (e.g., wear mask, isolate, social distance, avoid sharing personal items, clean and disinfect "high touch" surfaces, and frequent handwashing) according to CDC guidelines.  7. The patient or parent/caregiver has the option to accept or refuse treatment. 8. Patient medication history was reviewed for potential drug interactions:Interaction with home meds: Moderate interaction with Amitriptyline 9. Patient's GFR was calculated to be >60, and they were therefore prescribed Normal dose (GFR>60) - nirmatrelvir 150mg  tab (2 tablet) by mouth twice daily AND ritonavir 100mg  tab (1 tablet) by mouth twice daily   After reviewing above information with the patient, the patient agrees to receive Paxlovid.  Follow up instructions:    . Take prescription BID x 5 days as directed . Reach out to pharmacist for counseling on medication if desired . For concerns regarding further COVID symptoms please follow up with your PCP or urgent care . For urgent or life-threatening issues, seek care at your local emergency department  The patient was provided an opportunity to ask  questions, and all were answered. The patient agreed with the plan and demonstrated an understanding of the instructions.   Script sent to [facility]  The patient was advised to call their PCP or seek an in-person evaluation if the symptoms worsen or if the condition fails to improve as anticipated.   I provided 15 minutes of non face-to-face telephone visit time during this encounter, and > 50% was spent counseling as documented under my assessment & plan.  Kathrine Haddock, NP 07/31/2020 /3:34 PM

## 2020-08-01 ENCOUNTER — Other Ambulatory Visit: Payer: Self-pay

## 2020-08-09 ENCOUNTER — Telehealth: Payer: Self-pay | Admitting: Internal Medicine

## 2020-08-09 NOTE — Telephone Encounter (Signed)
Pt called in stating online she seen that she is past due for her pneumococcal vaccine and shingles shot. Pt would like to know if she needs these shots. I informed pt that these vaccines are usually for older patients and she stated she would like a call back from a nurse to know if she needs the shots or not.

## 2020-08-13 ENCOUNTER — Ambulatory Visit: Payer: Self-pay | Admitting: Gastroenterology

## 2020-08-16 ENCOUNTER — Other Ambulatory Visit: Payer: Self-pay

## 2020-08-16 MED FILL — Amitriptyline HCl Tab 10 MG: ORAL | 30 days supply | Qty: 60 | Fill #1 | Status: CN

## 2020-08-16 MED FILL — Pantoprazole Sodium EC Tab 40 MG (Base Equiv): ORAL | 30 days supply | Qty: 30 | Fill #1 | Status: CN

## 2020-08-20 NOTE — Progress Notes (Addendum)
Patient ID: Sherri [name], female    DOB: 18-Jan-1986  MRN: [id number]  CC: Vaginal Discharge   Subjective: Sherri [name] is a 35 y.o. female who presents for vaginal discharge.   Her concerns today include:   VAGINAL DISCHARGE: Onset: 2 days, white color  Urinary Frequency: yes  Odor: yes, strong  Itching: yes Dysuria: yes  Bleeding: no Pelvic pain: yes Fever: no   Genital sores: no  Rash: no  Dyspareunia: yes  Condom use: no  LMP: ended 3 days ago Comments: does have history of bacterial vaginitis and yeast infection   2. STOMACH PAIN: Ongoing for one week. Described as being sore. Reports it is the entire stomach. Does not radiate. Endorses nausea and diarrhea without blood in the stool.  3. ANEMIA SCREEN:  Recently feeling more fatigued.  Dizziness/lightheadedness: Yes[x]  lightheadedness  Dyspnea: Yes[]  No [x]  Reports she is getting enough rest at bedtime. Would like to be screen for anemia.   Patient Active Problem List   Diagnosis Date Noted   Prediabetes 07/05/2020   Gastroesophageal reflux disease 04/26/2020   Perianal rash 03/26/2020   Chronic nausea 03/26/2020   Chronic constipation 03/09/2019   Pelvic pain in female 03/09/2019   Hypertension    Genital herpes    Uterine fibroid 12/25/2013   Obesity 12/25/2013     Current Outpatient Medications on File Prior to Visit  Medication Sig Dispense Refill   acetaminophen (TYLENOL) 500 MG tablet TAKE 2 TABLETS (1,000 MG TOTAL) BY MOUTH EVERY 6 (SIX) HOURS AS NEEDED FOR MODERATE PAIN, FEVER OR HEADACHE. 30 tablet 0   amitriptyline (ELAVIL) 10 MG tablet TAKE 1 TABLET 1-3 HOURS PRIOR TO BEDTIME. INCREASE TO TWO TABLETS EVERY NIGHT AT BEDTIME IN TWO WEEKS IF TOLERATING. 90 tablet 1   azelastine (ASTELIN) 0.1 % nasal spray Place 2 sprays into both nostrils 2 (two) times daily. 30 mL 0   benzonatate (TESSALON) 100 MG capsule Take 1 capsule (100 mg total) by mouth every 8 (eight) hours. 21 capsule 0    Cholecalciferol (VITAMIN D3) 125 MCG (5000 UT) CAPS Take 1 capsule by mouth daily.     clotrimazole-betamethasone (LOTRISONE) cream APPLY TO AFFECTED AREA 2-3 TIMES DAILY FOR 2 WEEKS. 30 g 0   cyclobenzaprine (FLEXERIL) 10 MG tablet TAKE 1 TABLET (10 MG TOTAL) BY MOUTH AT BEDTIME. 20 tablet 0   dicyclomine (BENTYL) 20 MG tablet TAKE 1 TABLET (20 MG TOTAL) BY MOUTH 3 (THREE) TIMES DAILY AS NEEDED FOR SPASMS. 10 tablet 0   Ferrous Sulfate (IRON) 28 MG TABS Take 1 tablet by mouth daily.     metroNIDAZOLE (METROGEL VAGINAL) 0.75 % vaginal gel Place 1 Applicatorful vaginally at bedtime. 70 g 0   mirtazapine (REMERON) 15 MG tablet TAKE 2 TABLETS (30 MG TOTAL) BY MOUTH AT BEDTIME. 30 tablet 1   ondansetron (ZOFRAN) 4 MG tablet Take 1 tablet (4 mg total) by mouth every 8 (eight) hours as needed for nausea or vomiting. 20 tablet 3   pantoprazole (PROTONIX) 40 MG tablet TAKE 1 TABLET (40 MG TOTAL) BY MOUTH DAILY. 30 tablet 5   promethazine-dextromethorphan (PROMETHAZINE-DM) 6.25-15 MG/5ML syrup Take 5 mLs by mouth 4 (four) times daily as needed for cough. 118 mL 0   [DISCONTINUED] DULoxetine (CYMBALTA) 20 MG capsule Take one tablet daily x1 week. Then increase to 2 tablets x1 week. Then 3 tablets daily on week three. 180 capsule 0   No current facility-administered medications on file prior to visit.  Allergies  Allergen Reactions   Macrobid [Nitrofurantoin] Nausea And Vomiting    Social History   Socioeconomic History   Marital status: Single    Spouse name: Not on file   Number of children: Not on file   Years of education: Not on file   Highest education level: Not on file  Occupational History   Occupation: [employer]  Tobacco Use   Smoking status: Never   Smokeless tobacco: Never  Vaping Use   Vaping Use: Never used  Substance and Sexual Activity   Alcohol use: No   Drug use: No   Sexual activity: Yes    Birth control/protection: None  Other Topics Concern   Not on file  Social  History Narrative   Not on file   Social Determinants of Health   Financial Resource Strain: Not on file  Food Insecurity: Not on file  Transportation Needs: Not on file  Physical Activity: Not on file  Stress: Not on file  Social Connections: Not on file  Intimate Partner Violence: Not on file    Family History  Problem Relation Age of Onset   Hypertension Mother    Hypertension Father    Breast cancer Maternal Grandmother        30   Colon cancer Neg Hx    Colon polyps Neg Hx    Kidney disease Neg Hx    Diabetes Neg Hx    Esophageal cancer Neg Hx    Gallbladder disease Neg Hx    Heart disease Neg Hx    Asthma Neg Hx    Stroke Neg Hx    Stomach cancer Neg Hx     Past Surgical History:  Procedure Laterality Date   CESAREAN SECTION N/A 06/05/2014   Procedure: CESAREAN SECTION;  Surgeon: Truett Mainland, DO;  Location: [facility];  Service: Obstetrics;  Laterality: N/A;   WISDOM TOOTH EXTRACTION      ROS: Review of Systems Negative except as stated above  PHYSICAL EXAM: LMP 07/24/2020  Vitals with BMI 08/21/2020 07/28/2020 07/03/2020  Height 4' 11.016" - 4' 10.5"  Weight 191 lbs 10 oz - 195 lbs 6 oz  BMI 01.02 - 72.53  Systolic 664 403 474  Diastolic 84 85 82  Pulse 88 107 88    Physical Exam General appearance - alert, well appearing, and in no distress and oriented to person, place, and time Mental status - alert, oriented to person, place, and time, normal mood, behavior, speech, dress, motor activity, and thought processes Chest - clear to auscultation, no wheezes, rales or rhonchi, symmetric air entry, no tachypnea, retractions or cyanosis Heart - normal rate, regular rhythm, normal S1, S2, no murmurs, rubs, clicks or gallops Abdomen - tenderness noted right upper abdomen, right lower abdomen, and epigastric region  Neurological - alert, oriented, normal speech, no focal findings or movement disorder noted   Results for orders placed or performed in visit on  08/21/20  POCT URINALYSIS DIP (CLINITEK)  Result Value Ref Range   Color, UA yellow yellow   Clarity, UA clear clear   Glucose, UA negative negative mg/dL   Bilirubin, UA negative negative   Ketones, POC UA negative negative mg/dL   Spec Grav, UA 1.010 1.010 - 1.025   Blood, UA trace-intact (A) negative   pH, UA 5.5 5.0 - 8.0   POC PROTEIN,UA negative negative, trace   Urobilinogen, UA 0.2 0.2 or 1.0 E.U./dL   Nitrite, UA Negative Negative   Leukocytes, UA Negative Negative  ASSESSMENT AND PLAN: 1. Vaginal discharge: - Urinalysis negative for nitrites and leukocytes. No evidence of urinary tract infection. Does have trace blood in the urine and menses ended recently.  - Cervicovaginal self-swab to screen for chlamydia, gonorrhea, trichomonas, bacterial vaginitis, and candida vaginitis. - POCT URINALYSIS DIP (CLINITEK) - Cervicovaginal ancillary only  2. Generalized abdominal pain: - Ultrasound abdomen for further evaluation.  - CMP to check kidney function, liver function, and electrolyte balance.  - Comprehensive metabolic panel - US Abdomen Complete; Future  3. Screening for deficiency anemia: - CBC to screen for anemia. - CBC  4. Need for 23-polyvalent pneumococcal polysaccharide vaccine: - Administered today in office.  - Pneumococcal polysaccharide vaccine 23-valent greater than or equal to 2yo subcutaneous/IM   Patient was given the opportunity to ask questions.  Patient verbalized understanding of the plan and was able to repeat key elements of the plan. Patient was given clear instructions to go to Emergency Department or return to medical center if symptoms don't improve, worsen, or new problems develop.The patient verbalized understanding.   Orders Placed This Encounter  Procedures   US Abdomen Complete   CBC   Comprehensive metabolic panel   POCT URINALYSIS DIP (CLINITEK)    Follow-up with primary provider as scheduled.   Camillia Herter, NP

## 2020-08-21 ENCOUNTER — Other Ambulatory Visit: Payer: Self-pay

## 2020-08-21 ENCOUNTER — Ambulatory Visit (INDEPENDENT_AMBULATORY_CARE_PROVIDER_SITE_OTHER): Payer: Self-pay | Admitting: Family

## 2020-08-21 ENCOUNTER — Other Ambulatory Visit (HOSPITAL_COMMUNITY)
Admission: RE | Admit: 2020-08-21 | Discharge: 2020-08-21 | Disposition: A | Discharge status: Home / Self Care | Payer: Self-pay | Source: Ambulatory Visit | Attending: Family | Admitting: Family

## 2020-08-21 VITALS — BP 121/84 | HR 88 | Temp 98.1°F | Resp 16 | Ht 59.02 in | Wt 191.6 lb

## 2020-08-21 DIAGNOSIS — N898 Other specified noninflammatory disorders of vagina: Secondary | ICD-10-CM

## 2020-08-21 DIAGNOSIS — Z23 Encounter for immunization: Secondary | ICD-10-CM

## 2020-08-21 DIAGNOSIS — R1084 Generalized abdominal pain: Secondary | ICD-10-CM

## 2020-08-21 DIAGNOSIS — Z130 Encounter for screening for diseases of the blood and blood-forming organs and certain disorders involving the immune mechanism: Secondary | ICD-10-CM

## 2020-08-21 DIAGNOSIS — R399 Unspecified symptoms and signs involving the genitourinary system: Secondary | ICD-10-CM | POA: Insufficient documentation

## 2020-08-21 LAB — POCT URINALYSIS DIP (CLINITEK)
Bilirubin, UA: NEGATIVE
Glucose, UA: NEGATIVE mg/dL
Ketones, POC UA: NEGATIVE mg/dL
Leukocytes, UA: NEGATIVE
Nitrite, UA: NEGATIVE
POC PROTEIN,UA: NEGATIVE
Spec Grav, UA: 1.01 (ref 1.010–1.025)
Urobilinogen, UA: 0.2 E.U./dL
pH, UA: 5.5 (ref 5.0–8.0)

## 2020-08-21 NOTE — Addendum Note (Signed)
Addended by: Elmon Else on: 08/21/2020 05:24 PM   Modules accepted: Orders

## 2020-08-21 NOTE — Progress Notes (Signed)
Pt presents for frequent urination and vaginal discharge for about 2 days, pt states that she wants to check iron levels because she is very fatigue

## 2020-08-22 ENCOUNTER — Other Ambulatory Visit: Payer: Self-pay | Admitting: Family

## 2020-08-22 DIAGNOSIS — B9689 Other specified bacterial agents as the cause of diseases classified elsewhere: Secondary | ICD-10-CM

## 2020-08-22 LAB — CBC
Hematocrit: 38.3 % (ref 34.0–46.6)
Hemoglobin: 12.9 g/dL (ref 11.1–15.9)
MCH: 29.3 pg (ref 26.6–33.0)
MCHC: 33.7 g/dL (ref 31.5–35.7)
MCV: 87 fL (ref 79–97)
Platelets: 209 10*3/uL (ref 150–450)
RBC: 4.4 x10E6/uL (ref 3.77–5.28)
RDW: 14.1 % (ref 11.7–15.4)
WBC: 7.2 10*3/uL (ref 3.4–10.8)

## 2020-08-22 LAB — COMPREHENSIVE METABOLIC PANEL
ALT: 11 IU/L (ref 0–32)
AST: 15 IU/L (ref 0–40)
Albumin/Globulin Ratio: 1.8 (ref 1.2–2.2)
Albumin: 4.7 g/dL (ref 3.8–4.8)
Alkaline Phosphatase: 87 IU/L (ref 44–121)
BUN/Creatinine Ratio: 8 — ABNORMAL LOW (ref 9–23)
BUN: 6 mg/dL (ref 6–20)
Bilirubin Total: 0.3 mg/dL (ref 0.0–1.2)
CO2: 20 mmol/L (ref 20–29)
Calcium: 9.7 mg/dL (ref 8.7–10.2)
Chloride: 101 mmol/L (ref 96–106)
Creatinine, Ser: 0.78 mg/dL (ref 0.57–1.00)
Globulin, Total: 2.6 g/dL (ref 1.5–4.5)
Glucose: 74 mg/dL (ref 65–99)
Potassium: 4.1 mmol/L (ref 3.5–5.2)
Sodium: 139 mmol/L (ref 134–144)
Total Protein: 7.3 g/dL (ref 6.0–8.5)
eGFR: 102 mL/min/{1.73_m2} (ref >59)

## 2020-08-22 LAB — CERVICOVAGINAL ANCILLARY ONLY
Bacterial Vaginitis (gardnerella): POSITIVE — AB
Candida Glabrata: NEGATIVE
Candida Vaginitis: NEGATIVE
Chlamydia: NEGATIVE
Comment: NEGATIVE
Comment: NEGATIVE
Comment: NEGATIVE
Comment: NEGATIVE
Comment: NEGATIVE
Comment: NORMAL
Neisseria Gonorrhea: NEGATIVE
Trichomonas: NEGATIVE

## 2020-08-22 MED ORDER — METRONIDAZOLE 0.75 % VA GEL
1.0000 | Freq: Every day | VAGINAL | 0 refills | Status: DC
  Filled 2020-08-22 – 2020-09-11 (×2): qty 70, 7d supply, fill #0

## 2020-08-22 NOTE — Progress Notes (Signed)
Chlamydia, Gonorrhea, Trichomonas, and Yeast Infection negative.   Vaginal swab positive for Bacterial Vaginitis, an overgrowth of normal bacteria in the vagina due to changes in pH often related to semen, menstrual periods, or soaps. Prescribed Metronidazole vaginal gel. Do not drink alcohol while taking this medication.

## 2020-08-22 NOTE — Progress Notes (Signed)
Kidney function normal.   Liver function normal.   No anemia.   Urinalysis discussed in office.

## 2020-08-23 ENCOUNTER — Other Ambulatory Visit: Payer: Self-pay

## 2020-08-28 ENCOUNTER — Other Ambulatory Visit: Payer: Self-pay

## 2020-08-30 ENCOUNTER — Other Ambulatory Visit: Payer: Self-pay

## 2020-09-03 ENCOUNTER — Ambulatory Visit (HOSPITAL_COMMUNITY)
Admission: RE | Admit: 2020-09-03 | Discharge: 2020-09-03 | Disposition: A | Discharge status: Home / Self Care | Payer: Self-pay | Source: Ambulatory Visit | Attending: Family | Admitting: Family

## 2020-09-03 ENCOUNTER — Other Ambulatory Visit: Payer: Self-pay

## 2020-09-03 ENCOUNTER — Other Ambulatory Visit: Payer: Self-pay | Admitting: Family

## 2020-09-03 DIAGNOSIS — R1084 Generalized abdominal pain: Secondary | ICD-10-CM | POA: Insufficient documentation

## 2020-09-03 NOTE — Progress Notes (Signed)
Abdominal ultrasound overall normal.  Most recent liver enzymes normal. Will refer to Gastroenterology for continued abdominal pain. Their office should call patient within 2 weeks with appointment details.

## 2020-09-04 ENCOUNTER — Other Ambulatory Visit: Payer: Self-pay

## 2020-09-04 ENCOUNTER — Encounter (HOSPITAL_COMMUNITY): Payer: Self-pay | Admitting: Emergency Medicine

## 2020-09-04 ENCOUNTER — Emergency Department (HOSPITAL_COMMUNITY)
Admission: EM | Admit: 2020-09-04 | Discharge: 2020-09-04 | Disposition: A | Discharge status: Home / Self Care | Payer: Self-pay | Attending: Student | Admitting: Student

## 2020-09-04 ENCOUNTER — Encounter (HOSPITAL_COMMUNITY): Payer: Self-pay | Admitting: *Deleted

## 2020-09-04 ENCOUNTER — Ambulatory Visit (HOSPITAL_COMMUNITY)
Admission: EM | Admit: 2020-09-04 | Discharge: 2020-09-04 | Disposition: A | Discharge status: Home / Self Care | Payer: Self-pay | Attending: Student | Admitting: Student

## 2020-09-04 DIAGNOSIS — Z5321 Procedure and treatment not carried out due to patient leaving prior to being seen by health care provider: Secondary | ICD-10-CM | POA: Insufficient documentation

## 2020-09-04 DIAGNOSIS — G8929 Other chronic pain: Secondary | ICD-10-CM

## 2020-09-04 DIAGNOSIS — K5909 Other constipation: Secondary | ICD-10-CM

## 2020-09-04 DIAGNOSIS — R109 Unspecified abdominal pain: Secondary | ICD-10-CM

## 2020-09-04 DIAGNOSIS — R112 Nausea with vomiting, unspecified: Secondary | ICD-10-CM | POA: Insufficient documentation

## 2020-09-04 DIAGNOSIS — R110 Nausea: Secondary | ICD-10-CM

## 2020-09-04 DIAGNOSIS — R300 Dysuria: Secondary | ICD-10-CM | POA: Insufficient documentation

## 2020-09-04 LAB — URINALYSIS, ROUTINE W REFLEX MICROSCOPIC
Bacteria, UA: NONE SEEN
Bilirubin Urine: NEGATIVE
Glucose, UA: NEGATIVE mg/dL
Ketones, ur: NEGATIVE mg/dL
Leukocytes,Ua: NEGATIVE
Nitrite: NEGATIVE
Protein, ur: NEGATIVE mg/dL
Specific Gravity, Urine: 1.019 (ref 1.005–1.030)
pH: 5 (ref 5.0–8.0)

## 2020-09-04 LAB — CBC WITH DIFFERENTIAL/PLATELET
Abs Immature Granulocytes: 0 10*3/uL (ref 0.00–0.07)
Basophils Absolute: 0.1 10*3/uL (ref 0.0–0.1)
Basophils Relative: 1 %
Eosinophils Absolute: 0.1 10*3/uL (ref 0.0–0.5)
Eosinophils Relative: 2 %
HCT: 42.7 % (ref 36.0–46.0)
Hemoglobin: 13.6 g/dL (ref 12.0–15.0)
Immature Granulocytes: 0 %
Lymphocytes Relative: 41 %
Lymphs Abs: 2.5 10*3/uL (ref 0.7–4.0)
MCH: 28.7 pg (ref 26.0–34.0)
MCHC: 31.9 g/dL (ref 30.0–36.0)
MCV: 90.1 fL (ref 80.0–100.0)
Monocytes Absolute: 0.4 10*3/uL (ref 0.1–1.0)
Monocytes Relative: 6 %
Neutro Abs: 3.1 10*3/uL (ref 1.7–7.7)
Neutrophils Relative %: 50 %
Platelets: 190 10*3/uL (ref 150–400)
RBC: 4.74 MIL/uL (ref 3.87–5.11)
RDW: 15.3 % (ref 11.5–15.5)
WBC: 6.2 10*3/uL (ref 4.0–10.5)
nRBC: 0 % (ref 0.0–0.2)

## 2020-09-04 LAB — I-STAT BETA HCG BLOOD, ED (MC, WL, AP ONLY): I-stat hCG, quantitative: <5 m[IU]/mL (ref <5)

## 2020-09-04 LAB — COMPREHENSIVE METABOLIC PANEL
ALT: 16 U/L (ref 0–44)
AST: 19 U/L (ref 15–41)
Albumin: 4.4 g/dL (ref 3.5–5.0)
Alkaline Phosphatase: 73 U/L (ref 38–126)
Anion gap: 9 (ref 5–15)
BUN: 6 mg/dL (ref 6–20)
CO2: 22 mmol/L (ref 22–32)
Calcium: 9.7 mg/dL (ref 8.9–10.3)
Chloride: 105 mmol/L (ref 98–111)
Creatinine, Ser: 0.87 mg/dL (ref 0.44–1.00)
GFR, Estimated: >60 mL/min (ref >60)
Glucose, Bld: 91 mg/dL (ref 70–99)
Potassium: 3.8 mmol/L (ref 3.5–5.1)
Sodium: 136 mmol/L (ref 135–145)
Total Bilirubin: 0.7 mg/dL (ref 0.3–1.2)
Total Protein: 8 g/dL (ref 6.5–8.1)

## 2020-09-04 LAB — CBG MONITORING, ED: Glucose-Capillary: 87 mg/dL (ref 70–99)

## 2020-09-04 LAB — POCT URINALYSIS DIPSTICK, ED / UC
Bilirubin Urine: NEGATIVE
Glucose, UA: NEGATIVE mg/dL
Hgb urine dipstick: NEGATIVE
Leukocytes,Ua: NEGATIVE
Nitrite: NEGATIVE
Protein, ur: NEGATIVE mg/dL
Specific Gravity, Urine: >=1.03 (ref 1.005–1.030)
Urobilinogen, UA: 0.2 mg/dL (ref 0.0–1.0)
pH: 5.5 (ref 5.0–8.0)

## 2020-09-04 LAB — LIPASE, BLOOD: Lipase: 33 U/L (ref 11–51)

## 2020-09-04 MED ORDER — ONDANSETRON 8 MG PO TBDP
8.0000 mg | ORAL_TABLET | Freq: Three times a day (TID) | ORAL | 0 refills | Status: DC | PRN
  Filled 2020-09-04 – 2020-09-11 (×2): qty 20, 7d supply, fill #0

## 2020-09-04 NOTE — Discharge Instructions (Addendum)
-  Take the Zofran (ondansetron) up to 3 times daily for nausea and vomiting. -Increase the miralax to twice daily.  Drink plenty of fluids, like water.  Avoid caffeinated beverages like soda.  Eat lots of leafy green vegetables and fruits. -If your abdominal pain gets worse, or new symptoms like fever/chills, dizziness, chest pain, shortness of breath, weakness-head straight to the emergency department. -Follow-up with your primary care in 3 to 5 days to discuss your symptoms and request referral to gastroenterologist.

## 2020-09-04 NOTE — ED Provider Notes (Signed)
Emergency Medicine Provider Triage Evaluation Note  Dorothy Puffer , a 35 y.o. female  was evaluated in triage.  Pt complains of abdominal pain, nausea, vomiting x 2 weeks. Also endorses dysuria without hematuria. H/O of C section, no other abdominal surgeries.   Seen in UC yesterday.  UA unremarkable.  US Impression: Borderline dilated common bile duct. If the patient has elevated liver function tests, further evaluation with MRI/MRCP of the abdomen should be considered.    Review of Systems  Positive: Abdominal pain, nausea, vomiting, dysuria  Negative: Fevers, chills, hematuria   Physical Exam  BP (!) 136/100 (BP Location: Right Arm)   Pulse 68   Temp 98.9 F (37.2 C)   Resp 18   LMP 08/21/2020   SpO2 100%  Gen:   Awake, no distress   Resp:  Normal effort  MSK:   Moves extremities without difficulty  Other:  Suprapubic TTP and left CVAT   Medical Decision Making  Medically screening exam initiated at 10:17 AM.  Appropriate orders placed.  Lucianna A Nolt was informed that the remainder of the evaluation will be completed by another provider, this initial triage assessment does not replace that evaluation, and the importance of remaining in the ED until their evaluation is complete.     Sherrill Raring, PA-C 09/04/20 [place], [place], MD 09/05/20 [phone]

## 2020-09-04 NOTE — ED Triage Notes (Signed)
Pt reports she has been taking Laxatives and has ABD pain ,bloating and nausea

## 2020-09-04 NOTE — ED Provider Notes (Signed)
[facility]    CSN: [id number] Arrival date & time: 09/04/20  0802      History   Chief Complaint Chief Complaint  Patient presents with   Abdominal Pain   Constipation    HPI Sherri [name] is a 35 y.o. female presenting with constipation, abdominal pain, bloating, nausea x10 days. This is a chronic issue for her.  Medical history chronic constipation, chronic nausea, uterine fibroids, genital herpes, fibromyalgia, hypertension, prediabetes, chronic constipation.  Endorses 10 days of generalized crampy abdominal pain, constipation, nausea with occasional vomiting <1x daily. Denies hematemesis.  States she is having about 1 hard bowel movement daily and is requiring 1 capful of MiraLAX daily to elicit this.  Bowel movement is hard, without blood or mucus.  Endorses some radiation of pain to her bilateral flanks.  Vague complaint of urinary frequency as well.  This has been a chronic issue for patient for years, states these are her typical symptoms. States her primary care told her to go to urgent care or the emergency department if she continues having the symptoms. 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    Urinary tract infection    Uterine fibroid     Patient Active Problem List   Diagnosis Date Noted   Prediabetes 07/05/2020   Gastroesophageal reflux disease 04/26/2020   Perianal rash 03/26/2020   Chronic nausea 03/26/2020   Chronic constipation 03/09/2019   Pelvic pain in female 03/09/2019   Hypertension    Genital herpes    Uterine fibroid 12/25/2013   Obesity 12/25/2013   Bacterial vaginitis 04/27/2012    Past Surgical History:  Procedure Laterality Date   CESAREAN SECTION N/A 06/05/2014   Procedure: CESAREAN SECTION;   Surgeon: Truett Mainland, DO;  Location: [facility];  Service: Obstetrics;  Laterality: N/A;   WISDOM TOOTH EXTRACTION      OB History     Gravida  1   Para  1   Term      Preterm  1   AB      Living  2      SAB      IAB      Ectopic      Multiple  1   Live Births  2            Home Medications    Prior to Admission medications   Medication Sig Start Date End Date Taking? Authorizing Provider  ondansetron (ZOFRAN ODT) 8 MG disintegrating tablet Take 1 tablet (8 mg total) by mouth every 8 (eight) hours as needed for nausea or vomiting. 09/04/20  Yes Hazel Sams, PA-C  acetaminophen (TYLENOL) 500 MG tablet TAKE 2 TABLETS (1,000 MG TOTAL) BY MOUTH EVERY 6 (SIX) HOURS AS NEEDED FOR MODERATE PAIN, FEVER OR HEADACHE. 04/30/20 04/30/21  Domenic Moras, PA-C  amitriptyline (ELAVIL) 10 MG tablet TAKE 1 TABLET 1-3 HOURS PRIOR TO BEDTIME. INCREASE TO TWO TABLETS EVERY NIGHT AT BEDTIME IN TWO WEEKS IF TOLERATING. 05/21/20 05/21/21  Nicolette Bang, MD  azelastine (ASTELIN) 0.1 % nasal spray Place 2 sprays into both nostrils 2 (two) times daily. 02/10/20   Tasia Catchings, Amy V, PA-C  benzonatate (TESSALON) 100 MG capsule Take 1 capsule (100 mg total) by mouth every 8 (  eight) hours. 07/28/20   Hazel Sams, PA-C  Cholecalciferol (VITAMIN D3) 125 MCG (5000 UT) CAPS Take 1 capsule by mouth daily.    [provider]  clotrimazole-betamethasone (LOTRISONE) cream APPLY TO AFFECTED AREA 2-3 TIMES DAILY FOR 2 WEEKS. 04/04/20 04/04/21  Zehr, Janett Billow D, PA-C  cyclobenzaprine (FLEXERIL) 10 MG tablet TAKE 1 TABLET (10 MG TOTAL) BY MOUTH AT BEDTIME. 04/26/20 04/26/21  Melynda Ripple, MD  dicyclomine (BENTYL) 20 MG tablet TAKE 1 TABLET (20 MG TOTAL) BY MOUTH 3 (THREE) TIMES DAILY AS NEEDED FOR SPASMS. 04/22/20 04/22/21  Davonna Belling, MD  Ferrous Sulfate (IRON) 28 MG TABS Take 1 tablet by mouth daily.    [provider]  metroNIDAZOLE (METROGEL VAGINAL) 0.75 % vaginal gel Place 1  Applicatorful vaginally at bedtime. 08/22/20   Camillia Herter, NP  mirtazapine (REMERON) 15 MG tablet TAKE 2 TABLETS (30 MG TOTAL) BY MOUTH AT BEDTIME. 04/26/20 04/26/21  Zehr, Janett Billow D, PA-C  ondansetron (ZOFRAN) 4 MG tablet Take 1 tablet (4 mg total) by mouth every 8 (eight) hours as needed for nausea or vomiting. 07/03/20   Nicolette Bang, MD  pantoprazole (PROTONIX) 40 MG tablet TAKE 1 TABLET (40 MG TOTAL) BY MOUTH DAILY. 04/26/20 04/26/21  Zehr, Laban Emperor, PA-C  promethazine-dextromethorphan (PROMETHAZINE-DM) 6.25-15 MG/5ML syrup Take 5 mLs by mouth 4 (four) times daily as needed for cough. 07/28/20   Hazel Sams, PA-C  DULoxetine (CYMBALTA) 20 MG capsule Take one tablet daily x1 week. Then increase to 2 tablets x1 week. Then 3 tablets daily on week three. 05/09/20 05/17/20  Nicolette Bang, MD    Family History Family History  Problem Relation Age of Onset   Hypertension Mother    Hypertension Father    Breast cancer Maternal Grandmother        62   Colon cancer Neg Hx    Colon polyps Neg Hx    Kidney disease Neg Hx    Diabetes Neg Hx    Esophageal cancer Neg Hx    Gallbladder disease Neg Hx    Heart disease Neg Hx    Asthma Neg Hx    Stroke Neg Hx    Stomach cancer Neg Hx     Social History Social History   Tobacco Use   Smoking status: Never   Smokeless tobacco: Never  Vaping Use   Vaping Use: Never used  Substance Use Topics   Alcohol use: No   Drug use: No     Allergies   Macrobid [nitrofurantoin]   Review of Systems Review of Systems  Constitutional:  Negative for appetite change, chills, diaphoresis, fever and unexpected weight change.  HENT:  Negative for congestion, ear pain, sinus pressure, sinus pain, sneezing, sore throat and trouble swallowing.   Respiratory:  Negative for cough, chest tightness and shortness of breath.   Cardiovascular:  Negative for chest pain.  Gastrointestinal:  Positive for abdominal pain, constipation, nausea  and vomiting. Negative for abdominal distention, anal bleeding, blood in stool, diarrhea and rectal pain.  Genitourinary:  Positive for frequency. Negative for dysuria, flank pain and urgency.  Musculoskeletal:  Negative for back pain and myalgias.  Neurological:  Negative for dizziness, light-headedness and headaches.  All other systems reviewed and are negative.   Physical Exam Triage Vital Signs ED Triage Vitals  Enc Vitals Group     BP 09/04/20 0815 136/80     Pulse Rate 09/04/20 0815 78     Resp 09/04/20 0815 18  Temp 09/04/20 0815 99.3 F (37.4 C)     Temp src --      SpO2 09/04/20 0815 99 %     Weight --      Height --      Head Circumference --      Peak Flow --      Pain Score 09/04/20 0816 9     Pain Loc --      Pain Edu? --      Excl. in [place]? --    No data found.  Updated Vital Signs BP 136/80   Pulse 78   Temp 99.3 F (37.4 C)   Resp 18   LMP 08/21/2020   SpO2 99%   Visual Acuity Right Eye Distance:   Left Eye Distance:   Bilateral Distance:    Right Eye Near:   Left Eye Near:    Bilateral Near:     Physical Exam Vitals reviewed.  Constitutional:      General: She is not in acute distress.    Appearance: Normal appearance. She is not ill-appearing.  HENT:     Head: Normocephalic and atraumatic.     Mouth/Throat:     Mouth: Mucous membranes are moist.     Comments: Moist mucous membranes Eyes:     Extraocular Movements: Extraocular movements intact.     Pupils: Pupils are equal, round, and reactive to light.  Cardiovascular:     Rate and Rhythm: Normal rate and regular rhythm.     Heart sounds: Normal heart sounds.  Pulmonary:     Effort: Pulmonary effort is normal.     Breath sounds: Normal breath sounds. No wheezing, rhonchi or rales.  Abdominal:     General: Bowel sounds are decreased. There is no distension.     Palpations: Abdomen is soft. There is no mass.     Tenderness: There is generalized abdominal tenderness. There is no  right CVA tenderness, left CVA tenderness, guarding or rebound. Negative signs include Murphy's sign, Rovsing's sign and McBurney's sign.     Comments: BS are decreased but positive throughout.  Skin:    General: Skin is warm.     Capillary Refill: Capillary refill takes less than 2 seconds.     Comments: Good skin turgor  Neurological:     General: No focal deficit present.     Mental Status: She is alert and oriented to person, place, and time.  Psychiatric:        Mood and Affect: Mood normal.        Behavior: Behavior normal.     UC Treatments / Results  Labs (all labs ordered are listed, but only abnormal results are displayed) Labs Reviewed  POCT URINALYSIS DIPSTICK, ED / UC - Abnormal; Notable for the following components:      Result Value   Ketones, ur TRACE (*)    All other components within normal limits    EKG   Radiology US Abdomen Complete  Result Date: 09/03/2020 CLINICAL DATA:  Generalized abdominal pain EXAM: ABDOMEN ULTRASOUND COMPLETE COMPARISON:  CT abdomen pelvis 04/22/2020 FINDINGS: Gallbladder: No gallstones. Minimal gallbladder wall thickening likely due to under distension. 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 No sonographic Murphy sign noted by sonographer. Common bile duct: Diameter: 6-7 mm Liver: No focal lesion identified. Within normal limits in parenchymal echogenicity. Portal vein is patent on color Doppler imaging with normal direction of blood flow towards the liver. IVC: No abnormality visualized. Pancreas: Visualized portion unremarkable.  Spleen: Size and appearance within normal limits. Right Kidney: Length: 10.0 cm. Echogenicity within normal limits. No mass or hydronephrosis visualized. Left Kidney: Length: 10.1 cm. Echogenicity within normal limits. No mass or hydronephrosis visualized. Abdominal aorta: No aneurysm visualized. Other findings: None. IMPRESSION: Borderline dilated common bile duct. If the patient has elevated  liver function tests, further evaluation with MRI/MRCP of the abdomen should be considered. Electronically Signed   By: Miachel Roux M.D.   On: 09/03/2020 11:43    Procedures Procedures (including critical care time)  Medications Ordered in UC Medications - No data to display  Initial Impression / Assessment and Plan / UC Course  I have reviewed the triage vital signs and the nursing notes.  Pertinent labs & imaging results that were available during my care of the patient were reviewed by me and considered in my medical decision making (see chart for details).     This patient is a 35 year old female presenting with chronic constipation, chronic nausea, chronic abdominal pain.  On exam, she does have diffuse abdominal pain to palpation, but otherwise benign exam.  BS are full throughout and she is having daily bowel movement so low suspicion for bowel obstruction. MiraLAX is providing some relief, increase this to twice daily.  Also sent prescription for Zofran.  UA today with trace ketones, otherwise wnl. Fasting CBG 87. States she could not be pregnant.  Given this is a chronic issue for patient, strongly advised her to follow-up with her primary care.  She should follow-up with GI for the symptoms.  If abdominal pain gets worse, head straight to the emergency department.  She verbalizes understanding and agreement.  Coding this visit a Level 4 for review of past notes (4/10 abdominal pain), ordering and interpretation of labs, and prescription drug management.  Final Clinical Impressions(s) / UC Diagnoses   Final diagnoses:  Chronic constipation  Chronic abdominal pain  Chronic nausea     Discharge Instructions      -Take the Zofran (ondansetron) up to 3 times daily for nausea and vomiting. -Increase the miralax to twice daily.  Drink plenty of fluids, like water.  Avoid caffeinated beverages like soda.  Eat lots of leafy green vegetables and fruits. -If your abdominal pain  gets worse, or new symptoms like fever/chills, dizziness, chest pain, shortness of breath, weakness-head straight to the emergency department. -Follow-up with your primary care in 3 to 5 days to discuss your symptoms and request referral to gastroenterologist.     ED Prescriptions     Medication Sig Dispense Auth. Provider   ondansetron (ZOFRAN ODT) 8 MG disintegrating tablet Take 1 tablet (8 mg total) by mouth every 8 (eight) hours as needed for nausea or vomiting. 20 tablet Hazel Sams, PA-C      PDMP not reviewed this encounter.   Hazel Sams, PA-C 09/04/20 [phone]

## 2020-09-04 NOTE — ED Notes (Signed)
Pt has had some vaginal  Discharge for 2 weeks

## 2020-09-04 NOTE — ED Triage Notes (Signed)
Pt here from home with abd pain and nausea , pt had a Ua yesterday that looked fine and a [place] also that looked fine

## 2020-09-05 ENCOUNTER — Emergency Department (HOSPITAL_COMMUNITY)
Admission: EM | Admit: 2020-09-05 | Discharge: 2020-09-05 | Disposition: A | Discharge status: Home / Self Care | Payer: Self-pay | Attending: Emergency Medicine | Admitting: Emergency Medicine

## 2020-09-05 DIAGNOSIS — G8929 Other chronic pain: Secondary | ICD-10-CM | POA: Insufficient documentation

## 2020-09-05 DIAGNOSIS — K5900 Constipation, unspecified: Secondary | ICD-10-CM | POA: Insufficient documentation

## 2020-09-05 DIAGNOSIS — N898 Other specified noninflammatory disorders of vagina: Secondary | ICD-10-CM | POA: Insufficient documentation

## 2020-09-05 DIAGNOSIS — R300 Dysuria: Secondary | ICD-10-CM | POA: Insufficient documentation

## 2020-09-05 DIAGNOSIS — I10 Essential (primary) hypertension: Secondary | ICD-10-CM | POA: Insufficient documentation

## 2020-09-05 DIAGNOSIS — R1084 Generalized abdominal pain: Secondary | ICD-10-CM | POA: Insufficient documentation

## 2020-09-05 DIAGNOSIS — R110 Nausea: Secondary | ICD-10-CM | POA: Insufficient documentation

## 2020-09-05 LAB — WET PREP, GENITAL
Clue Cells Wet Prep HPF POC: NONE SEEN
Sperm: NONE SEEN
Trich, Wet Prep: NONE SEEN
WBC, Wet Prep HPF POC: NONE SEEN
Yeast Wet Prep HPF POC: NONE SEEN

## 2020-09-05 LAB — URINALYSIS, ROUTINE W REFLEX MICROSCOPIC
Bilirubin Urine: NEGATIVE
Glucose, UA: NEGATIVE mg/dL
Hgb urine dipstick: NEGATIVE
Ketones, ur: 5 mg/dL — AB
Leukocytes,Ua: NEGATIVE
Nitrite: NEGATIVE
Protein, ur: NEGATIVE mg/dL
Specific Gravity, Urine: 1.023 (ref 1.005–1.030)
pH: 5 (ref 5.0–8.0)

## 2020-09-05 LAB — CBC WITH DIFFERENTIAL/PLATELET
Abs Immature Granulocytes: 0.01 10*3/uL (ref 0.00–0.07)
Basophils Absolute: 0 10*3/uL (ref 0.0–0.1)
Basophils Relative: 1 %
Eosinophils Absolute: 0.1 10*3/uL (ref 0.0–0.5)
Eosinophils Relative: 2 %
HCT: 40.6 % (ref 36.0–46.0)
Hemoglobin: 13.1 g/dL (ref 12.0–15.0)
Immature Granulocytes: 0 %
Lymphocytes Relative: 39 %
Lymphs Abs: 2 10*3/uL (ref 0.7–4.0)
MCH: 28.9 pg (ref 26.0–34.0)
MCHC: 32.3 g/dL (ref 30.0–36.0)
MCV: 89.6 fL (ref 80.0–100.0)
Monocytes Absolute: 0.4 10*3/uL (ref 0.1–1.0)
Monocytes Relative: 7 %
Neutro Abs: 2.6 10*3/uL (ref 1.7–7.7)
Neutrophils Relative %: 51 %
Platelets: 246 10*3/uL (ref 150–400)
RBC: 4.53 MIL/uL (ref 3.87–5.11)
RDW: 15.2 % (ref 11.5–15.5)
WBC: 5.1 10*3/uL (ref 4.0–10.5)
nRBC: 0 % (ref 0.0–0.2)

## 2020-09-05 LAB — COMPREHENSIVE METABOLIC PANEL
ALT: 14 U/L (ref 0–44)
AST: 15 U/L (ref 15–41)
Albumin: 4.5 g/dL (ref 3.5–5.0)
Alkaline Phosphatase: 64 U/L (ref 38–126)
Anion gap: 8 (ref 5–15)
BUN: 11 mg/dL (ref 6–20)
CO2: 25 mmol/L (ref 22–32)
Calcium: 9.6 mg/dL (ref 8.9–10.3)
Chloride: 105 mmol/L (ref 98–111)
Creatinine, Ser: 0.83 mg/dL (ref 0.44–1.00)
GFR, Estimated: >60 mL/min (ref >60)
Glucose, Bld: 103 mg/dL — ABNORMAL HIGH (ref 70–99)
Potassium: 4 mmol/L (ref 3.5–5.1)
Sodium: 138 mmol/L (ref 135–145)
Total Bilirubin: 0.5 mg/dL (ref 0.3–1.2)
Total Protein: 7.9 g/dL (ref 6.5–8.1)

## 2020-09-05 LAB — I-STAT BETA HCG BLOOD, ED (MC, WL, AP ONLY): I-stat hCG, quantitative: <5 m[IU]/mL (ref <5)

## 2020-09-05 LAB — LIPASE, BLOOD: Lipase: 33 U/L (ref 11–51)

## 2020-09-05 MED ORDER — IBUPROFEN 200 MG PO TABS
600.0000 mg | ORAL_TABLET | Freq: Once | ORAL | Status: AC
  Administered 2020-09-05: 600 mg via ORAL
  Filled 2020-09-05: qty 3

## 2020-09-05 MED ORDER — ONDANSETRON 4 MG PO TBDP
4.0000 mg | ORAL_TABLET | Freq: Once | ORAL | Status: AC
  Administered 2020-09-05: 4 mg via ORAL
  Filled 2020-09-05: qty 1

## 2020-09-05 NOTE — Discharge Instructions (Addendum)
Your workup was overall reassuring in the ED today without any obvious findings. Your gonorrhea and chlamydia test are pending at this time - please await your results and refrain from intercourse until that time.   You can return to the ED or follow up at the local health department for treatment should your gonorrhea or chlamydia test return positive.   Please follow up with  your PCP and your gastroenterologist regarding ED visit today and for further eval.   Continue taking your medications as prescribed including the nausea medication prescribed at urgent care yesterday. Drink plenty of fluids to stay hydrated.   Return to the ED for any new/worsening symptoms

## 2020-09-05 NOTE — ED Provider Notes (Signed)
[facility] DEPT Provider Note   CSN: [id number] Arrival date & time: 09/05/20  1914     History Chief Complaint  Patient presents with   Abdominal Pain    Sherri [name] is a 35 y.o. female with PMHx HTN, fibromyalgia, chronic abdominal pain, and chronic constipation who presents to the ED today with complaint of gradual onset, constant, achy, diffuse, abdominal pain for the past 2 weeks. Pt also complains of dysuria, vaginal discharge, and vaginal irritation. No pelvic pain. Pt reports she has issues with chronic abdominal pain/constipation intermittently and this feels similar to previous flares. She sees GI however states they are unsure what the cause of her symptoms are. She does not take medications for same. She does take Miralax daily however states it does not seem to be helping despite having a daily BM after taking Miralax. Pt denies any vomiting or diarrhea however mentions feeling lightheaded; no syncope. Per chart review pt was seen at [facility] yesterday for same; had an abdominal ultrasound done with findings of: Borderline dilated common bile duct. If the patient has elevated liver function tests, further evaluation with MRI/MRCP of the abdomen should be considered. Pt then came to the ED yesterday night however appears she left prior to being evaluated. She returns today for same. Denies fevers, chills, pelvic pain, or any other associated symptoms. She is sexually active with 1 female partner however denies concern for STDs. No previous abdominal surgeries. LNMP 2 weeks ago.   The history is provided by the patient and medical records.      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    Urinary tract infection    Uterine fibroid     Patient Active Problem List   Diagnosis Date Noted   Prediabetes 07/05/2020    Gastroesophageal reflux disease 04/26/2020   Perianal rash 03/26/2020   Chronic nausea 03/26/2020   Chronic constipation 03/09/2019   Pelvic pain in female 03/09/2019   Hypertension    Genital herpes    Uterine fibroid 12/25/2013   Obesity 12/25/2013   Bacterial vaginitis 04/27/2012    Past Surgical History:  Procedure Laterality Date   CESAREAN SECTION N/A 06/05/2014   Procedure: CESAREAN SECTION;  Surgeon: Truett Mainland, DO;  Location: [facility];  Service: Obstetrics;  Laterality: N/A;   WISDOM TOOTH EXTRACTION       OB History     Gravida  1   Para  1   Term      Preterm  1   AB      Living  2      SAB      IAB      Ectopic      Multiple  1   Live Births  2           Family History  Problem Relation Age of Onset   Hypertension Mother    Hypertension Father    Breast cancer Maternal Grandmother        20   Colon cancer Neg Hx    Colon polyps Neg Hx    Kidney disease Neg Hx    Diabetes Neg Hx    Esophageal cancer Neg Hx    Gallbladder disease Neg Hx    Heart disease Neg Hx    Asthma Neg Hx  Stroke Neg Hx    Stomach cancer Neg Hx     Social History   Tobacco Use   Smoking status: Never   Smokeless tobacco: Never  Vaping Use   Vaping Use: Never used  Substance Use Topics   Alcohol use: No   Drug use: No    Home Medications Prior to Admission medications   Medication Sig Start Date End Date Taking? Authorizing Provider  acetaminophen (TYLENOL) 500 MG tablet TAKE 2 TABLETS (1,000 MG TOTAL) BY MOUTH EVERY 6 (SIX) HOURS AS NEEDED FOR MODERATE PAIN, FEVER OR HEADACHE. 04/30/20 04/30/21  Domenic Moras, PA-C  amitriptyline (ELAVIL) 10 MG tablet TAKE 1 TABLET 1-3 HOURS PRIOR TO BEDTIME. INCREASE TO TWO TABLETS EVERY NIGHT AT BEDTIME IN TWO WEEKS IF TOLERATING. 05/21/20 05/21/21  Nicolette Bang, MD  azelastine (ASTELIN) 0.1 % nasal spray Place 2 sprays into both nostrils 2 (two) times daily. 02/10/20   Tasia Catchings, Amy V, PA-C  benzonatate  (TESSALON) 100 MG capsule Take 1 capsule (100 mg total) by mouth every 8 (eight) hours. 07/28/20   Hazel Sams, PA-C  Cholecalciferol (VITAMIN D3) 125 MCG (5000 UT) CAPS Take 1 capsule by mouth daily.    [provider]  clotrimazole-betamethasone (LOTRISONE) cream APPLY TO AFFECTED AREA 2-3 TIMES DAILY FOR 2 WEEKS. 04/04/20 04/04/21  Zehr, Janett Billow D, PA-C  cyclobenzaprine (FLEXERIL) 10 MG tablet TAKE 1 TABLET (10 MG TOTAL) BY MOUTH AT BEDTIME. 04/26/20 04/26/21  Melynda Ripple, MD  dicyclomine (BENTYL) 20 MG tablet TAKE 1 TABLET (20 MG TOTAL) BY MOUTH 3 (THREE) TIMES DAILY AS NEEDED FOR SPASMS. 04/22/20 04/22/21  Davonna Belling, MD  Ferrous Sulfate (IRON) 28 MG TABS Take 1 tablet by mouth daily.    [provider]  metroNIDAZOLE (METROGEL VAGINAL) 0.75 % vaginal gel Place 1 Applicatorful vaginally at bedtime. 08/22/20   Camillia Herter, NP  mirtazapine (REMERON) 15 MG tablet TAKE 2 TABLETS (30 MG TOTAL) BY MOUTH AT BEDTIME. 04/26/20 04/26/21  Zehr, Janett Billow D, PA-C  ondansetron (ZOFRAN ODT) 8 MG disintegrating tablet Take 1 tablet (8 mg total) by mouth every 8 (eight) hours as needed for nausea or vomiting. 09/04/20   Hazel Sams, PA-C  ondansetron (ZOFRAN) 4 MG tablet Take 1 tablet (4 mg total) by mouth every 8 (eight) hours as needed for nausea or vomiting. 07/03/20   Nicolette Bang, MD  pantoprazole (PROTONIX) 40 MG tablet TAKE 1 TABLET (40 MG TOTAL) BY MOUTH DAILY. 04/26/20 04/26/21  Zehr, Laban Emperor, PA-C  promethazine-dextromethorphan (PROMETHAZINE-DM) 6.25-15 MG/5ML syrup Take 5 mLs by mouth 4 (four) times daily as needed for cough. 07/28/20   Hazel Sams, PA-C  DULoxetine (CYMBALTA) 20 MG capsule Take one tablet daily x1 week. Then increase to 2 tablets x1 week. Then 3 tablets daily on week three. 05/09/20 05/17/20  Nicolette Bang, MD    Allergies    Macrobid [nitrofurantoin]  Review of Systems   Review of Systems  Constitutional:  Negative for chills  and fever.  Gastrointestinal:  Positive for abdominal pain and nausea. Negative for diarrhea and vomiting.  Genitourinary:  Positive for dysuria and vaginal discharge. Negative for flank pain, frequency and pelvic pain.  Neurological:  Positive for light-headedness.  All other systems reviewed and are negative.  Physical Exam Updated Vital Signs BP 128/61   Pulse 85   Temp 98.3 F (36.8 C)   Resp 16   LMP 08/21/2020   SpO2 100%   Physical Exam Vitals and nursing note  reviewed.  Constitutional:      Appearance: She is not ill-appearing or diaphoretic.  HENT:     Head: Normocephalic and atraumatic.  Eyes:     Conjunctiva/sclera: Conjunctivae normal.  Cardiovascular:     Rate and Rhythm: Normal rate and regular rhythm.     Heart sounds: Normal heart sounds.  Pulmonary:     Effort: Pulmonary effort is normal.     Breath sounds: Normal breath sounds. No wheezing, rhonchi or rales.  Abdominal:     General: Abdomen is flat.     Palpations: Abdomen is soft.     Tenderness: There is generalized abdominal tenderness. There is no right CVA tenderness, left CVA tenderness, guarding or rebound.     Comments: Soft, very mild diffuse abdominal tenderness to palpation, +BS throughout, no r/g/r, neg murphy's, neg mcburney's, no CVA TTP  Musculoskeletal:     Cervical back: Neck supple.  Skin:    General: Skin is warm and dry.  Neurological:     Mental Status: She is alert.    ED Results / Procedures / Treatments   Labs (all labs ordered are listed, but only abnormal results are displayed) Labs Reviewed  COMPREHENSIVE METABOLIC PANEL - Abnormal; Notable for the following components:      Result Value   Glucose, Bld 103 (*)    All other components within normal limits  URINALYSIS, ROUTINE W REFLEX MICROSCOPIC - Abnormal; Notable for the following components:   APPearance HAZY (*)    Ketones, ur 5 (*)    All other components within normal limits  WET PREP, GENITAL  LIPASE, BLOOD   CBC WITH DIFFERENTIAL/PLATELET  I-STAT BETA HCG BLOOD, ED ([facility], [facility], [facility] ONLY)  GC/CHLAMYDIA PROBE AMP ([place]) NOT AT [facility]    EKG None  Radiology US Abdomen Complete  Result Date: 09/03/2020 CLINICAL DATA:  Generalized abdominal pain EXAM: ABDOMEN ULTRASOUND COMPLETE COMPARISON:  CT abdomen pelvis 04/22/2020 FINDINGS: Gallbladder: No gallstones. Minimal gallbladder wall thickening likely due to under distension. 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 No sonographic Murphy sign noted by sonographer. Common bile duct: Diameter: 6-7 mm Liver: No focal lesion identified. Within normal limits in parenchymal echogenicity. Portal vein is patent on color Doppler imaging with normal direction of blood flow towards the liver. IVC: No abnormality visualized. Pancreas: Visualized portion unremarkable. Spleen: Size and appearance within normal limits. Right Kidney: Length: 10.0 cm. Echogenicity within normal limits. No mass or hydronephrosis visualized. Left Kidney: Length: 10.1 cm. Echogenicity within normal limits. No mass or hydronephrosis visualized. Abdominal aorta: No aneurysm visualized. Other findings: None. IMPRESSION: Borderline dilated common bile duct. If the patient has elevated liver function tests, further evaluation with MRI/MRCP of the abdomen should be considered. Electronically Signed   By: Miachel Roux M.D.   On: 09/03/2020 11:43    Procedures Procedures   Medications Ordered in ED Medications  ibuprofen (ADVIL) tablet 600 mg (600 mg Oral Given 09/05/20 0722)  ondansetron (ZOFRAN-ODT) disintegrating tablet 4 mg (4 mg Oral Given 09/05/20 0745)    ED Course  I have reviewed the triage vital signs and the nursing notes.  Pertinent labs & imaging results that were available during my care of the patient were reviewed by me and considered in my medical decision making (see chart for details).    MDM Rules/Calculators/A&P                          35 year old  female presents  to the ED today with complaint of flare of chronic abdominal pain for the past 2 weeks with associated nausea and lightheadedness.  She also complains of dysuria and vaginal discharge.  Seen at urgent care yesterday for same, provided Zofran for nausea.  Advised to follow-up with PCP as it seemed to be a chronic issue.  Came to the ED last night however left prior to being seen.  On arrival to the ED this morning vitals are stable.  Patient is afebrile, nontachycardic and nontachypneic and appears to be in no acute distress.  She has very minimal mild tenderness palpation throughout her abdomen, no worse in specific area.  Very low suspicion for acute abdomen at this time. We will plan for lab work at this time including CBC, CMP, lipase.  We will also plan for beta-hCG and urinalysis.  Given complaints of vaginal irritation and discharge we will plan for wet prep and GC chlamydia probe.  Patient denies any pelvic pain at this time.  We will have her self swab.  Will provide ibuprofen for pain.  Will reevaluate.   Workup overall reassuring at this time.  Orthostatics negative CBC without leukocytosis. Hgb stable at 13.1 CMP with glucose 103. No other electrolyte abnormalities. Lfts unremarkable at this time.  Lipase WNLa t 33 Beta hcg negative U/A without signs of infection Wet prep without yeast, trich, or BV.   On reevaluation pt resting comfortably however continues to state she "doesn't feel good." Given this is a chronic issue for patient will have her follow up with GI next week for further eval; she reports she has an appointment on 07/13. Will discharge home at this time; pt has Rx for Zofran. Advised to take as needed and to drink plenty of fluids to stay hydrated. Pt in agreement with plan and stable for discharge home.   This note was prepared using Dragon voice recognition software and may include unintentional dictation errors due to the inherent limitations of voice  recognition software.  Final Clinical Impression(s) / ED Diagnoses Final diagnoses:  Generalized abdominal pain  Nausea    Rx / DC Orders ED Discharge Orders     None        Discharge Instructions      Your workup was overall reassuring in the ED today without any obvious findings. Your gonorrhea and chlamydia test are pending at this time - please await your results and refrain from intercourse until that time.   You can return to the ED or follow up at the local health department for treatment should your gonorrhea or chlamydia test return positive.   Please follow up with  your PCP and your gastroenterologist regarding ED visit today and for further eval.   Continue taking your medications as prescribed including the nausea medication prescribed at urgent care yesterday. Drink plenty of fluids to stay hydrated.   Return to the ED for any new/worsening symptoms        Eustaquio Maize, Hershal Coria 09/05/20 0840    Milton Ferguson, MD 09/08/20 1704

## 2020-09-05 NOTE — ED Triage Notes (Signed)
Pt arrived via POV, c/o diffuse abd pain and dysuria x2 weeks. Denies any n/v or diarrhea.

## 2020-09-06 LAB — GC/CHLAMYDIA PROBE AMP (~~LOC~~) NOT AT ARMC
Chlamydia: NEGATIVE
Comment: NEGATIVE
Comment: NORMAL
Neisseria Gonorrhea: NEGATIVE

## 2020-09-11 ENCOUNTER — Other Ambulatory Visit: Payer: Self-pay

## 2020-09-11 ENCOUNTER — Ambulatory Visit (INDEPENDENT_AMBULATORY_CARE_PROVIDER_SITE_OTHER): Payer: Self-pay | Admitting: Gastroenterology

## 2020-09-11 ENCOUNTER — Encounter: Payer: Self-pay | Admitting: Gastroenterology

## 2020-09-11 VITALS — BP 116/70 | HR 77 | Ht 59.0 in | Wt 192.2 lb

## 2020-09-11 DIAGNOSIS — R1084 Generalized abdominal pain: Secondary | ICD-10-CM

## 2020-09-11 DIAGNOSIS — K219 Gastro-esophageal reflux disease without esophagitis: Secondary | ICD-10-CM

## 2020-09-11 DIAGNOSIS — K5909 Other constipation: Secondary | ICD-10-CM

## 2020-09-11 NOTE — Progress Notes (Signed)
    History of Present Illness: This is a 35 year old female returning for follow-up of  chronic constipation, generalized abdominal pain, abdominal bloating, GERD, nausea, lightheaded feeling. CT AP in February 22 was negative. Abd [place] on September 03, 2020 showed borderline CBD dilation at 6 to 7 mm, borderline gallbladder wall thickening felt due to under distention with focal adenomyomatosis otherwise unremarkable study. LFTs checked several times over the past few months have been completely normal.  Current Medications, Allergies, Past Medical History, Past Surgical History, Family History and Social History were reviewed in [employer] record.   Physical Exam: General: Well developed, well nourished, no acute distress Head: Normocephalic and atraumatic Eyes: Sclerae anicteric, EOMI Ears: Normal auditory acuity Mouth: Not examined, mask on during Covid-19 pandemic Lungs: Clear throughout to auscultation Heart: Regular rate and rhythm; no murmurs, rubs or bruits Abdomen: Soft, non tender and non distended. No masses, hepatosplenomegaly or hernias noted. Normal Bowel sounds Rectal:Not done Musculoskeletal: Symmetrical with no gross deformities  Pulses:  Normal pulses noted Extremities: No clubbing, cyanosis, edema or deformities noted Neurological: Alert oriented x 4, grossly nonfocal Psychological:  Alert and cooperative. Normal mood and affect   Assessment and Recommendations:  Constipation. Generalized abdominal pain. Abdominal bloating. Increase Miralax to bid or tid to achieve a complete bowel movement daily. Offered colonoscopy or ACBE to further evaluate and she prefers ACBE. If ACBE is unremarkable no plans for further GI evaluation at this time.  Borderline CBD dilation with normal LFTs and no typical biliary symptoms.  No further evaluation recommended at this time. GERD. Continue pantoprazole 40 mg po qd. Follow antireflux measures closely. Follow up with  PCP.  Dizziness, lightheadedness. Follow up with PCP.

## 2020-09-11 NOTE — Patient Instructions (Addendum)
Increase your Miralax 1-3 x daily for adequate bowel movements.  Patient advised to avoid spicy, acidic, citrus, chocolate, mints, fruit and fruit juices.  Limit the intake of caffeine, alcohol and Soda.  Don't exercise too soon after eating.  Don't lie down within 3-4 hours of eating.  Elevate the head of your bed.   You are scheduled for a Barium enema x-ray examination on 09/18/20 at [facility].   Be prepared to spend 2 to 2 1/2 hours at the Radiology Department  It is very important that you follow the instructions below.  Failure to follow these instructions may result in a study that is less than optimal and may lead to the necessity of repeating the examination.   Barium Enema Prep  Please purchase the following items from the laxative section of your drug store 10 oz.  Magnesium Citrate Bottle 3 Bisacodyl Tablets-Enteric coated 1 Bisacodyl Suppository   Day before exam  12:00 Lunch. This meal may include clear broth, white chicken meat sandwich (no butter, lettuce or other additives), or two hard boiled eggs, strained fruit juices, jello or gelatin ( not containing fruits or nuts).  Coffee or tea (without milk or cream), carbonated beverages.  1:00 pm Drink one full glass or more of water   3:00 pm Drink one full glass or more of water  5:00 pm  Supper.  It is very important that supper be limited to the items described here Broth, Clear jello/gelatin with no whole foods added (no red), soft drinks, gatorade, water black coffee or tea (no milk or cream), popsicles.   No red jello or popsicles.   7:00 pm drink one full glass of water  8:00 pm Drink the bottle of Magnesium Citrate (drink cold if preferred)  10:00 pm  Take three (3) Bisacodyl tablets with one full glass or more of water.   Day of exam  NO breakfast, you may have coffee, tea (without milk or cream), or strained fruit juice 7:00 am unwrap the foil wrapper from bisacodyl suppository and discard the  foil.  While lying on your side with thigh elevated, insert the suppository into the rectum and gently push in as far as possible.  Retain the suppository for at least 15 minutes, if possible, before evacuating, even if the urge is strong. Bowel evacuation usually occurs within 15 to 60 minutes.  Patients requiring assistance should have a bed pan, commode or help readily available.   Report to the radiology department at [facility] on 09/18/20 at 10:30 am. Enter the hospital through the Outpatient Entrance on the [facility] side of the hospital.  Due to recent changes in healthcare laws, you may see the results of your imaging and laboratory studies on MyChart before your provider has had a chance to review them.  We understand that in some cases there may be results that are confusing or concerning to you. Not all laboratory results come back in the same time frame and the provider may be waiting for multiple results in order to interpret others.  Please give [place] 48 hours in order for your provider to thoroughly review all the results before contacting the office for clarification of your results.   The [facility] providers would like to encourage you to use [facility] to communicate with providers for non-urgent requests or questions.  Due to long hold times on the telephone, sending your provider a message by [facility] may be a faster and more efficient way to get a  response.  Please allow 48 business hours for a response.  Please remember that this is for non-urgent requests.   Normal BMI (Body Mass Index- based on height and weight) is between 19 and 25. Your BMI today is Body mass index is 38.82 kg/m. Marland Kitchen Please consider follow up  regarding your BMI with your Primary Care Provider.  Thank you for choosing me and [facility].  Pricilla Riffle. Dagoberto Ligas., MD., Marval Regal

## 2020-09-13 ENCOUNTER — Other Ambulatory Visit: Payer: Self-pay

## 2020-09-13 MED FILL — Amitriptyline HCl Tab 10 MG: ORAL | 30 days supply | Qty: 60 | Fill #1 | Status: AC

## 2020-09-13 MED FILL — Pantoprazole Sodium EC Tab 40 MG (Base Equiv): ORAL | 30 days supply | Qty: 30 | Fill #1 | Status: AC

## 2020-09-18 ENCOUNTER — Ambulatory Visit (HOSPITAL_COMMUNITY): Payer: Self-pay

## 2020-09-19 ENCOUNTER — Other Ambulatory Visit: Payer: Self-pay

## 2020-09-19 ENCOUNTER — Ambulatory Visit (HOSPITAL_COMMUNITY)
Admission: RE | Admit: 2020-09-19 | Discharge: 2020-09-19 | Disposition: A | Discharge status: Home / Self Care | Payer: Self-pay | Source: Ambulatory Visit | Attending: Gastroenterology | Admitting: Gastroenterology

## 2020-09-19 DIAGNOSIS — K5909 Other constipation: Secondary | ICD-10-CM | POA: Insufficient documentation

## 2020-09-19 DIAGNOSIS — R1084 Generalized abdominal pain: Secondary | ICD-10-CM | POA: Insufficient documentation

## 2020-09-20 ENCOUNTER — Other Ambulatory Visit: Payer: Self-pay

## 2020-09-20 ENCOUNTER — Other Ambulatory Visit: Payer: Self-pay | Admitting: *Deleted

## 2020-09-20 DIAGNOSIS — K5909 Other constipation: Secondary | ICD-10-CM

## 2020-09-20 MED ORDER — LUBIPROSTONE 24 MCG PO CAPS
24.0000 ug | ORAL_CAPSULE | Freq: Two times a day (BID) | ORAL | 3 refills | Status: DC
  Filled 2020-09-20: qty 60, 30d supply, fill #0

## 2020-09-23 ENCOUNTER — Other Ambulatory Visit: Payer: Self-pay

## 2020-09-24 ENCOUNTER — Ambulatory Visit (HOSPITAL_COMMUNITY)
Admission: EM | Admit: 2020-09-24 | Discharge: 2020-09-24 | Disposition: A | Discharge status: Home / Self Care | Payer: Self-pay | Attending: Family Medicine | Admitting: Family Medicine

## 2020-09-24 ENCOUNTER — Other Ambulatory Visit: Payer: Self-pay

## 2020-09-24 ENCOUNTER — Encounter (HOSPITAL_COMMUNITY): Payer: Self-pay

## 2020-09-24 DIAGNOSIS — J0100 Acute maxillary sinusitis, unspecified: Secondary | ICD-10-CM

## 2020-09-24 DIAGNOSIS — J3089 Other allergic rhinitis: Secondary | ICD-10-CM

## 2020-09-24 MED ORDER — AMOXICILLIN-POT CLAVULANATE 875-125 MG PO TABS
1.0000 | ORAL_TABLET | Freq: Two times a day (BID) | ORAL | 0 refills | Status: DC
  Filled 2020-09-24: qty 14, 7d supply, fill #0

## 2020-09-24 MED ORDER — DEXAMETHASONE SODIUM PHOSPHATE 10 MG/ML IJ SOLN
10.0000 mg | Freq: Once | INTRAMUSCULAR | Status: AC
  Administered 2020-09-24: 10 mg via INTRAMUSCULAR

## 2020-09-24 MED ORDER — DEXAMETHASONE SODIUM PHOSPHATE 10 MG/ML IJ SOLN
INTRAMUSCULAR | Status: AC
  Filled 2020-09-24: qty 1

## 2020-09-24 NOTE — ED Provider Notes (Signed)
[facility]    CSN: [id number] Arrival date & time: 09/24/20  0808      History   Chief Complaint Chief Complaint  Patient presents with   Nasal Congestion    HPI Sherri [name] is a 35 y.o. female.   Patient presenting today with 2-week history of acutely worsening congestion, sinus pain and pressure, headache, malaise, mild cough.  Denies chest pain, shortness of breath, fevers, chills, body aches, nausea vomiting or diarrhea.  Trying numerous over-the-counter remedies such as antihistamines, Mucinex, Sudafed, nasal sprays with no relief.  No known sick contacts.  Has not tested for COVID since onset of symptoms.   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    Urinary tract infection    Uterine fibroid     Patient Active Problem List   Diagnosis Date Noted   Prediabetes 07/05/2020   Gastroesophageal reflux disease 04/26/2020   Perianal rash 03/26/2020   Chronic nausea 03/26/2020   Chronic constipation 03/09/2019   Pelvic pain in female 03/09/2019   Hypertension    Genital herpes    Uterine fibroid 12/25/2013   Obesity 12/25/2013   Bacterial vaginitis 04/27/2012    Past Surgical History:  Procedure Laterality Date   CESAREAN SECTION N/A 06/05/2014   Procedure: CESAREAN SECTION;  Surgeon: Truett Mainland, DO;  Location: [facility];  Service: Obstetrics;  Laterality: N/A;   WISDOM TOOTH EXTRACTION      OB History     Gravida  1   Para  1   Term      Preterm  1   AB      Living  2      SAB      IAB      Ectopic      Multiple  1   Live Births  2            Home Medications    Prior to Admission medications   Medication Sig Start Date End Date Taking? Authorizing Provider  amoxicillin-clavulanate (AUGMENTIN) 875-125 MG tablet Take 1 tablet by mouth every 12 (twelve) hours. 09/24/20  Yes  Volney American, PA-C  acetaminophen (TYLENOL) 500 MG tablet TAKE 2 TABLETS (1,000 MG TOTAL) BY MOUTH EVERY 6 (SIX) HOURS AS NEEDED FOR MODERATE PAIN, FEVER OR HEADACHE. 04/30/20 04/30/21  Domenic Moras, PA-C  amitriptyline (ELAVIL) 10 MG tablet TAKE 1 TABLET 1-3 HOURS PRIOR TO BEDTIME. INCREASE TO TWO TABLETS EVERY NIGHT AT BEDTIME IN TWO WEEKS IF TOLERATING. 05/21/20 05/21/21  Nicolette Bang, MD  azelastine (ASTELIN) 0.1 % nasal spray Place 2 sprays into both nostrils 2 (two) times daily. 02/10/20   Ok Edwards, PA-C  Cholecalciferol (VITAMIN D3) 125 MCG (5000 UT) CAPS Take 1 capsule by mouth daily.    [provider]  cyclobenzaprine (FLEXERIL) 10 MG tablet TAKE 1 TABLET (10 MG TOTAL) BY MOUTH AT BEDTIME. 04/26/20 04/26/21  Melynda Ripple, MD  dicyclomine (BENTYL) 20 MG tablet TAKE 1 TABLET (20 MG TOTAL) BY MOUTH 3 (THREE) TIMES DAILY AS NEEDED FOR SPASMS. 04/22/20 04/22/21  Davonna Belling, MD  Ferrous Sulfate (IRON) 28 MG TABS Take 1 tablet by mouth daily.    [provider]  lubiprostone (AMITIZA) 24 MCG capsule Take 1 capsule (24 mcg total) by mouth 2 (two) times daily with a meal. 09/20/20  Ladene Artist, MD  metroNIDAZOLE (METROGEL VAGINAL) 0.75 % vaginal gel Place 1 Applicatorful vaginally at bedtime. 08/22/20   Camillia Herter, NP  mirtazapine (REMERON) 15 MG tablet TAKE 2 TABLETS (30 MG TOTAL) BY MOUTH AT BEDTIME. 04/26/20 04/26/21  Zehr, Janett Billow D, PA-C  ondansetron (ZOFRAN) 4 MG tablet Take 1 tablet (4 mg total) by mouth every 8 (eight) hours as needed for nausea or vomiting. 07/03/20   Nicolette Bang, MD  pantoprazole (PROTONIX) 40 MG tablet TAKE 1 TABLET (40 MG TOTAL) BY MOUTH DAILY. 04/26/20 04/26/21  Zehr, Laban Emperor, PA-C    Family History Family History  Problem Relation Age of Onset   Hypertension Mother    Hypertension Father    Breast cancer Maternal Grandmother        65   Colon cancer Neg Hx    Colon polyps Neg Hx    Kidney disease Neg  Hx    Diabetes Neg Hx    Esophageal cancer Neg Hx    Gallbladder disease Neg Hx    Heart disease Neg Hx    Asthma Neg Hx    Stroke Neg Hx    Stomach cancer Neg Hx     Social History Social History   Tobacco Use   Smoking status: Never   Smokeless tobacco: Never  Vaping Use   Vaping Use: Never used  Substance Use Topics   Alcohol use: No   Drug use: No     Allergies   Macrobid [nitrofurantoin]   Review of Systems Review of Systems Per HPI  Physical Exam Triage Vital Signs ED Triage Vitals  Enc Vitals Group     BP 09/24/20 0825 134/86     Pulse Rate 09/24/20 0825 77     Resp 09/24/20 0825 18     Temp 09/24/20 0825 99.2 F (37.3 C)     Temp Source 09/24/20 0825 Oral     SpO2 09/24/20 0825 100 %     Weight --      Height --      Head Circumference --      Peak Flow --      Pain Score 09/24/20 0823 8     Pain Loc --      Pain Edu? --      Excl. in [place]? --    No data found.  Updated Vital Signs BP 134/86 (BP Location: Left Arm)   Pulse 77   Temp 99.2 F (37.3 C) (Oral)   Resp 18   LMP 09/09/2020   SpO2 100%   Visual Acuity Right Eye Distance:   Left Eye Distance:   Bilateral Distance:    Right Eye Near:   Left Eye Near:    Bilateral Near:     Physical Exam Vitals and nursing note reviewed.  Constitutional:      Appearance: Normal appearance. She is not ill-appearing.  HENT:     Head: Atraumatic.     Comments: Maxillary sinuses tender to palpation, worse on the right    Right Ear: Tympanic membrane normal.     Left Ear: Tympanic membrane normal.     Nose: Congestion present.     Mouth/Throat:     Mouth: Mucous membranes are moist.     Pharynx: Posterior oropharyngeal erythema present. No oropharyngeal exudate.  Eyes:     Extraocular Movements: Extraocular movements intact.     Conjunctiva/sclera: Conjunctivae normal.  Cardiovascular:     Rate and Rhythm: Normal rate and regular rhythm.  Heart sounds: Normal heart sounds.   Pulmonary:     Effort: Pulmonary effort is normal.     Breath sounds: Normal breath sounds. No wheezing or rales.  Abdominal:     General: Bowel sounds are normal.     Palpations: Abdomen is soft.  Musculoskeletal:        General: Normal range of motion.     Cervical back: Normal range of motion and neck supple.  Skin:    General: Skin is warm and dry.  Neurological:     Mental Status: She is alert and oriented to person, place, and time.  Psychiatric:        Mood and Affect: Mood normal.        Thought Content: Thought content normal.        Judgment: Judgment normal.     UC Treatments / Results  Labs (all labs ordered are listed, but only abnormal results are displayed) Labs Reviewed - No data to display  EKG   Radiology No results found.  Procedures Procedures (including critical care time)  Medications Ordered in UC Medications  dexamethasone (DECADRON) injection 10 mg (has no administration in time range)    Initial Impression / Assessment and Plan / UC Course  I have reviewed the triage vital signs and the nursing notes.  Pertinent labs & imaging results that were available during my care of the patient were reviewed by me and considered in my medical decision making (see chart for details).     Given duration and worsening course, will treat for acute maxillary sinusitis with IM Decadron in clinic, continued over-the-counter supportive medications and home care and Augmentin prescription sent to case worsening or not resolving over the next 3 to 4 days.  Discussed strict return precautions for worsening or unresolving symptoms. Final Clinical Impressions(s) / UC Diagnoses   Final diagnoses:  Acute non-recurrent maxillary sinusitis  Seasonal allergic rhinitis due to other allergic trigger   Discharge Instructions   None    ED Prescriptions     Medication Sig Dispense Auth. Provider   amoxicillin-clavulanate (AUGMENTIN) 875-125 MG tablet Take 1  tablet by mouth every 12 (twelve) hours. 14 tablet Volney American, [place]      PDMP not reviewed this encounter.   Volney American, [place] 09/24/20 0900

## 2020-09-24 NOTE — ED Triage Notes (Signed)
Pt c/o congestion for 2 weeks, denies other symptoms.  Interventions: home remedies

## 2020-09-25 ENCOUNTER — Other Ambulatory Visit: Payer: Self-pay

## 2020-09-27 ENCOUNTER — Other Ambulatory Visit: Payer: Self-pay

## 2020-09-30 ENCOUNTER — Telehealth: Payer: Self-pay | Admitting: Gastroenterology

## 2020-09-30 ENCOUNTER — Encounter (HOSPITAL_COMMUNITY): Payer: Self-pay

## 2020-09-30 ENCOUNTER — Other Ambulatory Visit: Payer: Self-pay

## 2020-09-30 ENCOUNTER — Ambulatory Visit (HOSPITAL_COMMUNITY)
Admission: EM | Admit: 2020-09-30 | Discharge: 2020-09-30 | Disposition: A | Discharge status: Home / Self Care | Payer: Self-pay | Attending: Student | Admitting: Student

## 2020-09-30 DIAGNOSIS — T50905A Adverse effect of unspecified drugs, medicaments and biological substances, initial encounter: Secondary | ICD-10-CM | POA: Insufficient documentation

## 2020-09-30 DIAGNOSIS — M791 Myalgia, unspecified site: Secondary | ICD-10-CM | POA: Insufficient documentation

## 2020-09-30 DIAGNOSIS — Z20822 Contact with and (suspected) exposure to covid-19: Secondary | ICD-10-CM | POA: Insufficient documentation

## 2020-09-30 DIAGNOSIS — R197 Diarrhea, unspecified: Secondary | ICD-10-CM | POA: Insufficient documentation

## 2020-09-30 DIAGNOSIS — J301 Allergic rhinitis due to pollen: Secondary | ICD-10-CM | POA: Insufficient documentation

## 2020-09-30 LAB — SARS CORONAVIRUS 2 (TAT 6-24 HRS): SARS Coronavirus 2: NEGATIVE

## 2020-09-30 NOTE — ED Triage Notes (Signed)
Pt presents with generalized body aches, nausea, and diarrhea since been on medications to treat sinus infection X 5 days.

## 2020-09-30 NOTE — ED Provider Notes (Signed)
[place]    CSN: [id number] Arrival date & time: 09/30/20  0816      History   Chief Complaint Chief Complaint  Patient presents with   Generalized Body Aches   Diarrhea    HPI Sherri [name] is a 35 y.o. female presenting with generalized body aches, nausea, diarrhea for 5 days.  Was last at our urgent care on 09/24/2020 and was diagnosed with acute sinusitis at that time.  She was treated with IM decadron and started on Augmentin, which she has been taking as directed. Also notes that she was started on Amitiza by her GI doctor 1 week ago for chronic constipation. Notes nausea without vomiting and few episodes of watery diarrhea daily. Generalized crampy abd pain. Tylenol providing minimal relief. Notes improvement in her facial pressure, though still has lingering nasal congestion. Taking zyrtec for allergic rhinitis. Denies fevers/chills, shortness of breath, chest pain, cough, congestion, facial pain, teeth pain, headaches, sore throat, loss of taste/smell, swollen lymph nodes, ear pain.    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    Urinary tract infection    Uterine fibroid     Patient Active Problem List   Diagnosis Date Noted   Prediabetes 07/05/2020   Gastroesophageal reflux disease 04/26/2020   Perianal rash 03/26/2020   Chronic nausea 03/26/2020   Chronic constipation 03/09/2019   Pelvic pain in female 03/09/2019   Hypertension    Genital herpes    Uterine fibroid 12/25/2013   Obesity 12/25/2013   Bacterial vaginitis 04/27/2012    Past Surgical History:  Procedure Laterality Date   CESAREAN SECTION N/A 06/05/2014   Procedure: CESAREAN SECTION;  Surgeon: Truett Mainland, DO;  Location: [facility];  Service: Obstetrics;  Laterality: N/A;   WISDOM TOOTH EXTRACTION      OB History     Gravida  1    Para  1   Term      Preterm  1   AB      Living  2      SAB      IAB      Ectopic      Multiple  1   Live Births  2            Home Medications    Prior to Admission medications   Medication Sig Start Date End Date Taking? Authorizing Provider  acetaminophen (TYLENOL) 500 MG tablet TAKE 2 TABLETS (1,000 MG TOTAL) BY MOUTH EVERY 6 (SIX) HOURS AS NEEDED FOR MODERATE PAIN, FEVER OR HEADACHE. 04/30/20 04/30/21  Domenic Moras, PA-C  amitriptyline (ELAVIL) 10 MG tablet TAKE 1 TABLET 1-3 HOURS PRIOR TO BEDTIME. INCREASE TO TWO TABLETS EVERY NIGHT AT BEDTIME IN TWO WEEKS IF TOLERATING. 05/21/20 05/21/21  Nicolette Bang, MD  amoxicillin-clavulanate (AUGMENTIN) 875-125 MG tablet Take 1 tablet by mouth every 12 (twelve) hours. 09/24/20   Volney American, PA-C  azelastine (ASTELIN) 0.1 % nasal spray Place 2 sprays into both nostrils 2 (two) times daily. 02/10/20   Ok Edwards, PA-C  Cholecalciferol (VITAMIN D3) 125 MCG (5000 UT) CAPS Take 1 capsule by mouth daily.    [provider]  cyclobenzaprine (FLEXERIL) 10 MG tablet TAKE 1 TABLET (10 MG TOTAL) BY MOUTH AT BEDTIME. 04/26/20 04/26/21  Melynda Ripple, MD  dicyclomine (BENTYL) 20  MG tablet TAKE 1 TABLET (20 MG TOTAL) BY MOUTH 3 (THREE) TIMES DAILY AS NEEDED FOR SPASMS. 04/22/20 04/22/21  Davonna Belling, MD  Ferrous Sulfate (IRON) 28 MG TABS Take 1 tablet by mouth daily.    [provider]  lubiprostone (AMITIZA) 24 MCG capsule Take 1 capsule (24 mcg total) by mouth 2 (two) times daily with a meal. 09/20/20   Ladene Artist, MD  metroNIDAZOLE (METROGEL VAGINAL) 0.75 % vaginal gel Place 1 Applicatorful vaginally at bedtime. 08/22/20   Camillia Herter, NP  mirtazapine (REMERON) 15 MG tablet TAKE 2 TABLETS (30 MG TOTAL) BY MOUTH AT BEDTIME. 04/26/20 04/26/21  Zehr, Janett Billow D, PA-C  ondansetron (ZOFRAN) 4 MG tablet Take 1 tablet (4 mg total) by mouth every 8 (eight) hours as needed for nausea or vomiting. 07/03/20    Nicolette Bang, MD  pantoprazole (PROTONIX) 40 MG tablet TAKE 1 TABLET (40 MG TOTAL) BY MOUTH DAILY. 04/26/20 04/26/21  Zehr, Laban Emperor, PA-C    Family History Family History  Problem Relation Age of Onset   Hypertension Mother    Hypertension Father    Breast cancer Maternal Grandmother        28   Colon cancer Neg Hx    Colon polyps Neg Hx    Kidney disease Neg Hx    Diabetes Neg Hx    Esophageal cancer Neg Hx    Gallbladder disease Neg Hx    Heart disease Neg Hx    Asthma Neg Hx    Stroke Neg Hx    Stomach cancer Neg Hx     Social History Social History   Tobacco Use   Smoking status: Never   Smokeless tobacco: Never  Vaping Use   Vaping Use: Never used  Substance Use Topics   Alcohol use: No   Drug use: No     Allergies   Macrobid [nitrofurantoin]   Review of Systems Review of Systems  Constitutional:  Negative for appetite change, chills, diaphoresis, fever and unexpected weight change.  HENT:  Negative for congestion, ear pain, sinus pressure, sinus pain, sneezing, sore throat and trouble swallowing.   Respiratory:  Negative for cough, chest tightness and shortness of breath.   Cardiovascular:  Negative for chest pain.  Gastrointestinal:  Positive for diarrhea and nausea. Negative for abdominal distention, abdominal pain, anal bleeding, blood in stool, constipation, rectal pain and vomiting.  Genitourinary:  Negative for dysuria, flank pain, frequency and urgency.  Musculoskeletal:  Positive for myalgias. Negative for back pain.  Neurological:  Negative for dizziness, light-headedness and headaches.  All other systems reviewed and are negative.   Physical Exam Triage Vital Signs ED Triage Vitals  Enc Vitals Group     BP 09/30/20 0851 133/87     Pulse Rate 09/30/20 0851 83     Resp 09/30/20 0851 18     Temp 09/30/20 0851 99 F (37.2 C)     Temp Source 09/30/20 0851 Oral     SpO2 09/30/20 0851 98 %     Weight --      Height --      Head  Circumference --      Peak Flow --      Pain Score 09/30/20 0852 4     Pain Loc --      Pain Edu? --      Excl. in [place]? --    No data found.  Updated Vital Signs BP 133/87 (BP Location: Left Arm)   Pulse  83   Temp 99 F (37.2 C) (Oral)   Resp 18   LMP 09/09/2020   SpO2 98%   Visual Acuity Right Eye Distance:   Left Eye Distance:   Bilateral Distance:    Right Eye Near:   Left Eye Near:    Bilateral Near:     Physical Exam Vitals reviewed.  Constitutional:      General: She is not in acute distress.    Appearance: Normal appearance. She is not ill-appearing.  HENT:     Head: Normocephalic and atraumatic.     Mouth/Throat:     Mouth: Mucous membranes are moist.     Comments: Moist mucous membranes Eyes:     Extraocular Movements: Extraocular movements intact.     Pupils: Pupils are equal, round, and reactive to light.  Cardiovascular:     Rate and Rhythm: Normal rate and regular rhythm.     Heart sounds: Normal heart sounds.  Pulmonary:     Effort: Pulmonary effort is normal.     Breath sounds: Normal breath sounds. No wheezing, rhonchi or rales.  Abdominal:     General: Bowel sounds are normal. There is no distension.     Palpations: Abdomen is soft. There is no mass.     Tenderness: There is no abdominal tenderness. There is no right CVA tenderness, left CVA tenderness, guarding or rebound.  Skin:    General: Skin is warm.     Capillary Refill: Capillary refill takes less than 2 seconds.     Comments: Good skin turgor  Neurological:     General: No focal deficit present.     Mental Status: She is alert and oriented to person, place, and time.  Psychiatric:        Mood and Affect: Mood normal.        Behavior: Behavior normal.        Thought Content: Thought content normal.        Judgment: Judgment normal.     UC Treatments / Results  Labs (all labs ordered are listed, but only abnormal results are displayed) Labs Reviewed  SARS CORONAVIRUS 2 (TAT  6-24 HRS)    EKG   Radiology No results found.  Procedures Procedures (including critical care time)  Medications Ordered in UC Medications - No data to display  Initial Impression / Assessment and Plan / UC Course  I have reviewed the triage vital signs and the nursing notes.  Pertinent labs & imaging results that were available during my care of the patient were reviewed by me and considered in my medical decision making (see chart for details).     This patient is a very pleasant 35 y.o. year old female presenting with bodyaches, nausea, diarrhea x1 week.  She was started on Augmentin by [place] 1 week ago, which she has been taking as directed.  Was also started on Amitiza by her gastroenterologist 1 week ago.  Sinusitis has largely resolved, though I suspect allergic rhinitis is continued contributing to the continued nasal congestion; continue Zyrtec and Flonase.  Suspect that the Amitiza is contributing to her symptoms, recommend she follow-up with her GI. Covid PCR sent, as it's possible she contracted Covid at her visit with [place]. ED return precautions discussed. Patient verbalizes understanding and agreement.    Final Clinical Impressions(s) / UC Diagnoses   Final diagnoses:  Myalgia  Diarrhea, unspecified type  Adverse effect of drug, initial encounter  Seasonal allergic rhinitis due to pollen  Discharge Instructions      -Stop the antibiotic that we prescribed (Augmentin). -It is possible that the Augmentin is causing your GI symptoms, but this typically does not cause body aches.  We are testing you for COVID to make sure that this is not the cause of your symptoms. -Your allergies may be contributing to the lingering nasal congestion. Continue daily zyrtec and nasal steroid. -If your symptoms persist, I recommend following-up with your GI doctor. Your lubiprostone (AMITIZA) 24 MCG capsule could be contributing to the symptoms.  -For bodyaches- Take Tylenol 1000 mg  3 times daily, and ibuprofen 800 mg 3 times daily with food.  You can take these together, or alternate every 3-4 hours.      ED Prescriptions   None    PDMP not reviewed this encounter.   Hazel Sams, PA-C 09/30/20 [phone]

## 2020-09-30 NOTE — Telephone Encounter (Signed)
Inbound call from patient. States she have been experiencing diarrhea and joint pain for about a week.

## 2020-09-30 NOTE — Telephone Encounter (Signed)
Patient was started on the Amitiza and Augmentin last week.  She has had diarrhea and myalgias this last week.  Was evaluated in the Urgent care this am.  They advised her to stop Augmentin.  They tested her for COVID.  She has been holding the Amitiza since last week.  She is advised to call back if her symptoms have not resolved by Thursday if she is COVID negative.  Advised could be from Augmentin.  She thanked me for the call and will call back once she has her COVID results and if still having diarrhea.

## 2020-09-30 NOTE — Discharge Instructions (Addendum)
-  Stop the antibiotic that we prescribed (Augmentin). -It is possible that the Augmentin is causing your GI symptoms, but this typically does not cause body aches.  We are testing you for COVID to make sure that this is not the cause of your symptoms. -Your allergies may be contributing to the lingering nasal congestion. Continue daily zyrtec and nasal steroid. -If your symptoms persist, I recommend following-up with your GI doctor. Your lubiprostone (AMITIZA) 24 MCG capsule could be contributing to the symptoms.  -For bodyaches- Take Tylenol 1000 mg 3 times daily, and ibuprofen 800 mg 3 times daily with food.  You can take these together, or alternate every 3-4 hours.

## 2020-09-30 NOTE — Telephone Encounter (Signed)
Left message for patient to call back  

## 2020-10-03 NOTE — Telephone Encounter (Signed)
Patient calling back stating covid test came back negative but she is still experiencing diarrhea.  Please advise.

## 2020-10-03 NOTE — Telephone Encounter (Signed)
Left message for patient to call back New labs entered

## 2020-10-03 NOTE — Telephone Encounter (Signed)
GI stool profile if symptoms persist and she is Covid negative [place] lactose free, low fat, no raw fruit, no raw vegetable diet until diarrhea resolves

## 2020-10-03 NOTE — Telephone Encounter (Signed)
See phone notes from earlier in the week. Patient continues to have 2-3 diarrhea stools throughout the day.  Despite stopping Augmentin and the amitiza.  Hx of chronic constipation.

## 2020-10-03 NOTE — Addendum Note (Signed)
Addended by: Marlon Pel on: 10/03/2020 03:56 PM   Modules accepted: Orders

## 2020-10-04 ENCOUNTER — Other Ambulatory Visit: Payer: Self-pay

## 2020-10-04 NOTE — Telephone Encounter (Signed)
Patient notified.  She will come by the lab at her convenience.

## 2020-10-07 ENCOUNTER — Other Ambulatory Visit: Payer: Self-pay

## 2020-10-07 DIAGNOSIS — R197 Diarrhea, unspecified: Secondary | ICD-10-CM

## 2020-10-09 LAB — GI PROFILE, STOOL, PCR

## 2020-10-18 ENCOUNTER — Other Ambulatory Visit: Payer: Self-pay

## 2020-10-21 ENCOUNTER — Telehealth: Payer: Self-pay | Admitting: Family

## 2020-10-21 NOTE — Telephone Encounter (Signed)
Patient called requesting a refill on ondansetron (ZOFRAN) 4 MG tablet  If refilled  patient would like the pills that dissolve under her tongue because she states that those pills wok better for her.   Please f/u

## 2020-10-23 ENCOUNTER — Other Ambulatory Visit: Payer: Self-pay

## 2020-10-25 ENCOUNTER — Other Ambulatory Visit: Payer: Self-pay

## 2020-10-25 ENCOUNTER — Telehealth: Payer: Self-pay | Admitting: Family

## 2020-10-25 ENCOUNTER — Other Ambulatory Visit: Payer: Self-pay | Admitting: Family

## 2020-10-25 DIAGNOSIS — R110 Nausea: Secondary | ICD-10-CM

## 2020-10-25 MED ORDER — ONDANSETRON 4 MG PO TBDP
4.0000 mg | ORAL_TABLET | Freq: Three times a day (TID) | ORAL | 1 refills | Status: DC | PRN
  Filled 2020-10-25 – 2020-11-01 (×2): qty 20, 7d supply, fill #0

## 2020-10-25 NOTE — Telephone Encounter (Signed)
I am unfamiliar as to why medication was prescribed for patient. Last refilled by Phill Myron, DO on 07/03/2020. Per patient request will refill Ondansetron (Zofran) dissolvable tablets for courtesy supply. Please schedule appointment for additional refills and to update history. Patient welcome to virtual or in-person visit.

## 2020-10-25 NOTE — Telephone Encounter (Signed)
Rx #: [id number]  ondansetron (ZOFRAN) 4 MG tablet [id number]   Order Details Dose: 4 mg Route: Oral Frequency: Every 8 hours PRN for nausea, vomiting  Dispense Quantity: 20 tablet Refills: 3 Fills remaining: 0       Shows 0 remaining pt last seen 08-21-20 didn't see any pharmacy confirmation

## 2020-10-25 NOTE — Telephone Encounter (Signed)
Called to tell pt provider will refill Ondansetron (Zofran) dissolvable tablets for courtesy supply. Please schedule appointment for additional refills and to update history. Patient welcome to virtual or in-person visit. Called and LVM with this information and office number for a call back

## 2020-10-28 ENCOUNTER — Ambulatory Visit (HOSPITAL_COMMUNITY)
Admission: EM | Admit: 2020-10-28 | Discharge: 2020-10-28 | Disposition: A | Discharge status: Home / Self Care | Payer: Self-pay | Attending: Emergency Medicine | Admitting: Emergency Medicine

## 2020-10-28 ENCOUNTER — Encounter (HOSPITAL_COMMUNITY): Payer: Self-pay | Admitting: Emergency Medicine

## 2020-10-28 DIAGNOSIS — R519 Headache, unspecified: Secondary | ICD-10-CM

## 2020-10-28 DIAGNOSIS — N939 Abnormal uterine and vaginal bleeding, unspecified: Secondary | ICD-10-CM

## 2020-10-28 LAB — POCT URINALYSIS DIPSTICK, ED / UC
Bilirubin Urine: NEGATIVE
Glucose, UA: NEGATIVE mg/dL
Ketones, ur: NEGATIVE mg/dL
Leukocytes,Ua: NEGATIVE
Nitrite: NEGATIVE
Protein, ur: NEGATIVE mg/dL
Specific Gravity, Urine: <=1.005 (ref 1.005–1.030)
Urobilinogen, UA: 0.2 mg/dL (ref 0.0–1.0)
pH: 5 (ref 5.0–8.0)

## 2020-10-28 LAB — POC URINE PREG, ED: Preg Test, Ur: NEGATIVE

## 2020-10-28 MED ORDER — DEXAMETHASONE SODIUM PHOSPHATE 10 MG/ML IJ SOLN
INTRAMUSCULAR | Status: AC
  Filled 2020-10-28: qty 1

## 2020-10-28 MED ORDER — METOCLOPRAMIDE HCL 5 MG/ML IJ SOLN
5.0000 mg | Freq: Once | INTRAMUSCULAR | Status: AC
  Administered 2020-10-28: 5 mg via INTRAMUSCULAR

## 2020-10-28 MED ORDER — TIZANIDINE HCL 4 MG PO TABS: 4.0000 mg | ORAL_TABLET | Freq: Four times a day (QID) | ORAL | 0 refills | Status: DC | PRN

## 2020-10-28 MED ORDER — METOCLOPRAMIDE HCL 5 MG/ML IJ SOLN
INTRAMUSCULAR | Status: AC
  Filled 2020-10-28: qty 2

## 2020-10-28 MED ORDER — DEXAMETHASONE SODIUM PHOSPHATE 10 MG/ML IJ SOLN
10.0000 mg | Freq: Once | INTRAMUSCULAR | Status: AC
  Administered 2020-10-28: 10 mg via INTRAMUSCULAR

## 2020-10-28 NOTE — ED Triage Notes (Signed)
Reports on day 3 of menstrual cycle and abd cramping/pains are worse than normal. Reports tried heat, tylenol and ibuprofen. Reports pain was so bad today had to leave work early. States that flow is heavier than normal but denies passing any clots. Pt also having headache for past three days as well.

## 2020-10-28 NOTE — ED Provider Notes (Signed)
[facility]    CSN: [id number] Arrival date & time: 10/28/20  1643      History   Chief Complaint Chief Complaint  Patient presents with   Abdominal Pain    HPI Sherri [name] is a 35 y.o. female.   Patient here for evaluation of abdominal pain and cramping that has been ongoing since she started her period 3 days ago.  Reports pain is worse than normal.  Reports period is heavier than normal.  Reports trying heat, Tylenol, and ibuprofen with minimal relief.  Also reports having headache for the past 3 days.  Denies any history of irregular periods, PCOS, or endometriosis.  Denies any concerns for STIs.  Denies any trauma, injury, or other precipitating event.  Denies any specific alleviating or aggravating factors.  Denies any fevers, chest pain, shortness of breath, N/V/D, numbness, tingling, weakness, abdominal pain, or headaches.    The history is provided by the patient.  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    Urinary tract infection    Uterine fibroid     Patient Active Problem List   Diagnosis Date Noted   Prediabetes 07/05/2020   Gastroesophageal reflux disease 04/26/2020   Perianal rash 03/26/2020   Chronic nausea 03/26/2020   Chronic constipation 03/09/2019   Pelvic pain in female 03/09/2019   Hypertension    Genital herpes    Uterine fibroid 12/25/2013   Obesity 12/25/2013   Bacterial vaginitis 04/27/2012    Past Surgical History:  Procedure Laterality Date   CESAREAN SECTION N/A 06/05/2014   Procedure: CESAREAN SECTION;  Surgeon: Truett Mainland, DO;  Location: [facility];  Service: Obstetrics;  Laterality: N/A;   WISDOM TOOTH EXTRACTION      OB History     Gravida  1   Para  1   Term      Preterm  1   AB      Living  2      SAB      IAB      Ectopic      Multiple   1   Live Births  2            Home Medications    Prior to Admission medications   Medication Sig Start Date End Date Taking? Authorizing Provider  tiZANidine (ZANAFLEX) 4 MG tablet Take 1 tablet (4 mg total) by mouth every 6 (six) hours as needed for muscle spasms. 10/28/20  Yes Pearson Forster, NP  acetaminophen (TYLENOL) 500 MG tablet TAKE 2 TABLETS (1,000 MG TOTAL) BY MOUTH EVERY 6 (SIX) HOURS AS NEEDED FOR MODERATE PAIN, FEVER OR HEADACHE. 04/30/20 04/30/21  Domenic Moras, PA-C  amitriptyline (ELAVIL) 10 MG tablet TAKE 1 TABLET 1-3 HOURS PRIOR TO BEDTIME. INCREASE TO TWO TABLETS EVERY NIGHT AT BEDTIME IN TWO WEEKS IF TOLERATING. 05/21/20 05/21/21  Nicolette Bang, MD  amoxicillin-clavulanate (AUGMENTIN) 875-125 MG tablet Take 1 tablet by mouth every 12 (twelve) hours. 09/24/20   Volney American, PA-C  azelastine (ASTELIN) 0.1 % nasal spray Place 2 sprays into both nostrils 2 (two) times daily. 02/10/20   Ok Edwards, PA-C  Cholecalciferol (VITAMIN D3) 125 MCG (5000 UT) CAPS Take 1 capsule by mouth daily.    [provider]  cyclobenzaprine (FLEXERIL) 10 MG tablet TAKE 1  TABLET (10 MG TOTAL) BY MOUTH AT BEDTIME. 04/26/20 04/26/21  Melynda Ripple, MD  dicyclomine (BENTYL) 20 MG tablet TAKE 1 TABLET (20 MG TOTAL) BY MOUTH 3 (THREE) TIMES DAILY AS NEEDED FOR SPASMS. 04/22/20 04/22/21  Davonna Belling, MD  Ferrous Sulfate (IRON) 28 MG TABS Take 1 tablet by mouth daily.    [provider]  lubiprostone (AMITIZA) 24 MCG capsule Take 1 capsule (24 mcg total) by mouth 2 (two) times daily with a meal. 09/20/20   Ladene Artist, MD  metroNIDAZOLE (METROGEL VAGINAL) 0.75 % vaginal gel Place 1 Applicatorful vaginally at bedtime. 08/22/20   Camillia Herter, NP  mirtazapine (REMERON) 15 MG tablet TAKE 2 TABLETS (30 MG TOTAL) BY MOUTH AT BEDTIME. 04/26/20 04/26/21  Zehr, Janett Billow D, PA-C  ondansetron (ZOFRAN-ODT) 4 MG disintegrating tablet Take 1 tablet (4 mg total) by mouth  every 8 (eight) hours as needed for nausea or vomiting. 10/25/20   Camillia Herter, NP  pantoprazole (PROTONIX) 40 MG tablet TAKE 1 TABLET (40 MG TOTAL) BY MOUTH DAILY. 04/26/20 04/26/21  Zehr, Laban Emperor, PA-C    Family History Family History  Problem Relation Age of Onset   Hypertension Mother    Hypertension Father    Breast cancer Maternal Grandmother        75   Colon cancer Neg Hx    Colon polyps Neg Hx    Kidney disease Neg Hx    Diabetes Neg Hx    Esophageal cancer Neg Hx    Gallbladder disease Neg Hx    Heart disease Neg Hx    Asthma Neg Hx    Stroke Neg Hx    Stomach cancer Neg Hx     Social History Social History   Tobacco Use   Smoking status: Never   Smokeless tobacco: Never  Vaping Use   Vaping Use: Never used  Substance Use Topics   Alcohol use: No   Drug use: No     Allergies   Macrobid [nitrofurantoin]   Review of Systems Review of Systems  Gastrointestinal:  Positive for abdominal pain.  Genitourinary:  Positive for menstrual problem and pelvic pain.  All other systems reviewed and are negative.   Physical Exam Triage Vital Signs ED Triage Vitals  Enc Vitals Group     BP 10/28/20 1748 (!) 142/91     Pulse Rate 10/28/20 1748 71     Resp 10/28/20 1748 18     Temp 10/28/20 1748 98.5 F (36.9 C)     Temp Source 10/28/20 1748 Oral     SpO2 10/28/20 1748 99 %     Weight --      Height --      Head Circumference --      Peak Flow --      Pain Score 10/28/20 1749 8     Pain Loc --      Pain Edu? --      Excl. in [place]? --    No data found.  Updated Vital Signs BP (!) 142/91 (BP Location: Right Arm)   Pulse 71   Temp 98.5 F (36.9 C) (Oral)   Resp 18   LMP 10/26/2020 (Exact Date)   SpO2 99%   Visual Acuity Right Eye Distance:   Left Eye Distance:   Bilateral Distance:    Right Eye Near:   Left Eye Near:    Bilateral Near:     Physical Exam Vitals and nursing note reviewed.  Constitutional:  General: She is not in acute  distress.    Appearance: Normal appearance. She is not ill-appearing, toxic-appearing or diaphoretic.  HENT:     Head: Normocephalic and atraumatic.  Eyes:     Conjunctiva/sclera: Conjunctivae normal.  Cardiovascular:     Rate and Rhythm: Normal rate.     Pulses: Normal pulses.  Pulmonary:     Effort: Pulmonary effort is normal.  Abdominal:     General: Abdomen is flat.     Tenderness: There is abdominal tenderness in the suprapubic area. There is no right CVA tenderness, left CVA tenderness, guarding or rebound. Negative signs include psoas sign and obturator sign.  Musculoskeletal:        General: Normal range of motion.     Cervical back: Normal range of motion.  Skin:    General: Skin is warm and dry.  Neurological:     General: No focal deficit present.     Mental Status: She is alert and oriented to person, place, and time.     GCS: GCS eye subscore is 4. GCS verbal subscore is 5. GCS motor subscore is 6.     Cranial Nerves: Cranial nerves are intact.     Sensory: Sensation is intact.     Motor: Motor function is intact.     Coordination: Coordination is intact.     Gait: Gait is intact.  Psychiatric:        Mood and Affect: Mood normal.     UC Treatments / Results  Labs (all labs ordered are listed, but only abnormal results are displayed) Labs Reviewed  POCT URINALYSIS DIPSTICK, ED / UC - Abnormal; Notable for the following components:      Result Value   Hgb urine dipstick LARGE (*)    All other components within normal limits  POC URINE PREG, ED    EKG   Radiology No results found.  Procedures Procedures (including critical care time)  Medications Ordered in UC Medications  metoCLOPramide (REGLAN) injection 5 mg (5 mg Intramuscular Given 10/28/20 1836)  dexamethasone (DECADRON) injection 10 mg (10 mg Intramuscular Given 10/28/20 1836)    Initial Impression / Assessment and Plan / UC Course  I have reviewed the triage vital signs and the nursing  notes.  Pertinent labs & imaging results that were available during my care of the patient were reviewed by me and considered in my medical decision making (see chart for details).    Assessment negative for red flags or concerns.  No focal neurodeficits.  Urinalysis positive for hemoglobin but otherwise negative, urine pregnancy test negative.  Decadron and Reglan administered IM in office for headache.  Patient instructed that she may take Benadryl when she gets home.  May take tizanidine as needed for abdominal cramping.  Continue Tylenol and/or ibuprofen as needed for pain and fevers.  Encourage fluids and rest.  May use heat, ice, or alternate between heat and ice.  Recommend following up with gynecology for further evaluation of abnormal vaginal bleeding.  Strict ED follow-up for any red flag symptoms. Final Clinical Impressions(s) / UC Diagnoses   Final diagnoses:  Abnormal vaginal bleeding  Acute nonintractable headache, unspecified headache type     Discharge Instructions      You can take Benadryl when you get home to help with your headache.  You can take the tizanidine as needed for abdominal pain and cramping. You can take Tylenol and/or ibuprofen as needed for pain relief and fever reduction.  Make sure you  are drinking plenty of fluids especially water and rest as much as possible. You can use heating pads, ice, or alternate between heat and ice for comfort.  Follow-up with your gynecologist for further evaluation of abnormal vaginal bleeding. If you develop the worse headache of your life, worsening dizziness, nausea/vomiting, blurred vision, slurred speech, difficulty walking, weakness on one side, chest pain, shortness of breath, or altered mental status, call 911 or go directly to the Emergency Department for further evaluation.       ED Prescriptions     Medication Sig Dispense Auth. Provider   tiZANidine (ZANAFLEX) 4 MG tablet Take 1 tablet (4 mg total) by mouth  every 6 (six) hours as needed for muscle spasms. 20 tablet Pearson Forster, NP      PDMP not reviewed this encounter.   Pearson Forster, NP 10/28/20 [phone]

## 2020-10-28 NOTE — Discharge Instructions (Addendum)
You can take Benadryl when you get home to help with your headache.  You can take the tizanidine as needed for abdominal pain and cramping. You can take Tylenol and/or ibuprofen as needed for pain relief and fever reduction.  Make sure you are drinking plenty of fluids especially water and rest as much as possible. You can use heating pads, ice, or alternate between heat and ice for comfort.  Follow-up with your gynecologist for further evaluation of abnormal vaginal bleeding. If you develop the worse headache of your life, worsening dizziness, nausea/vomiting, blurred vision, slurred speech, difficulty walking, weakness on one side, chest pain, shortness of breath, or altered mental status, call 911 or go directly to the Emergency Department for further evaluation.

## 2020-11-01 ENCOUNTER — Other Ambulatory Visit: Payer: Self-pay

## 2020-11-23 ENCOUNTER — Other Ambulatory Visit: Payer: Self-pay

## 2020-11-23 ENCOUNTER — Ambulatory Visit (HOSPITAL_COMMUNITY)
Admission: EM | Admit: 2020-11-23 | Discharge: 2020-11-23 | Disposition: A | Discharge status: Home / Self Care | Payer: Self-pay | Attending: Student | Admitting: Student

## 2020-11-23 ENCOUNTER — Encounter (HOSPITAL_COMMUNITY): Payer: Self-pay | Admitting: Emergency Medicine

## 2020-11-23 DIAGNOSIS — J069 Acute upper respiratory infection, unspecified: Secondary | ICD-10-CM

## 2020-11-23 MED ORDER — METHYLPREDNISOLONE SODIUM SUCC 125 MG IJ SOLR
INTRAMUSCULAR | Status: AC
  Filled 2020-11-23: qty 2

## 2020-11-23 MED ORDER — METHYLPREDNISOLONE SODIUM SUCC 125 MG IJ SOLR
60.0000 mg | Freq: Once | INTRAMUSCULAR | Status: AC
  Administered 2020-11-23: 60 mg via INTRAMUSCULAR

## 2020-11-23 NOTE — Progress Notes (Signed)
Virtual Visit via Telephone Note  I connected with Sherri [name], on 11/26/2020 at 4:37 PM by telephone due to the COVID-19 pandemic and verified that I am speaking with the correct person using two identifiers.  Due to current restrictions/limitations of in-office visits due to the COVID-19 pandemic, this scheduled clinical appointment was converted to a telehealth visit.   Consent: I discussed the limitations, risks, security and privacy concerns of performing an evaluation and management service by telephone and the availability of in person appointments. I also discussed with the patient that there may be a patient responsible charge related to this service. The patient expressed understanding and agreed to proceed.   Location of Patient: Home  Location of Provider: [facility] [place] participating in Telemedicine visit: [place], NP Elmon Else, [place]  History of Present Illness: Sherri [name] is a 35 year-old female who presents for chronic nausea, requesting refills on Zofran. Reports she has been seen by GI doctor and was told everything is ok in regards to chronic nausea and to continue with Zofran. Reports nasal congestion with yellow mucus, cough, and mild headaches for the past 3 days. Denies chest pain and shortness of breath. Reports urge to urinate and endorses burning.    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 30 tablet 0   amitriptyline (ELAVIL) 10 MG tablet TAKE 1 TABLET 1-3 HOURS PRIOR TO BEDTIME. INCREASE TO TWO TABLETS EVERY NIGHT AT BEDTIME IN TWO WEEKS IF TOLERATING. 90 tablet 1   amoxicillin-clavulanate (AUGMENTIN) 875-125 MG tablet Take 1 tablet by mouth every 12 (twelve) hours. 14 tablet 0   azelastine (ASTELIN) 0.1 % nasal spray Place 2 sprays into both nostrils 2 (two) times daily. 30 mL 0   Cholecalciferol (VITAMIN D3) 125 MCG (5000 UT) CAPS Take 1 capsule by mouth daily.     cyclobenzaprine (FLEXERIL) 10 MG tablet TAKE 1 TABLET (10 MG TOTAL) BY MOUTH AT BEDTIME. 20 tablet 0   dicyclomine (BENTYL) 20 MG tablet TAKE 1 TABLET (20 MG TOTAL) BY MOUTH 3 (THREE) TIMES DAILY AS NEEDED FOR SPASMS. 10 tablet 0   Ferrous Sulfate (IRON) 28 MG TABS Take 1 tablet by mouth daily.     lubiprostone (AMITIZA) 24 MCG capsule Take 1 capsule (24 mcg total) by mouth 2 (two) times daily with a meal. 60 capsule 3   metroNIDAZOLE (METROGEL VAGINAL) 0.75 % vaginal gel Place 1 Applicatorful vaginally at bedtime. 70 g 0   mirtazapine (REMERON) 15 MG tablet TAKE 2 TABLETS (30 MG TOTAL) BY MOUTH AT BEDTIME. 30 tablet 1   ondansetron (ZOFRAN-ODT) 4 MG disintegrating tablet Take 1 tablet (4 mg total) by mouth every 8 (eight) hours as needed for nausea or vomiting. 20 tablet 1   pantoprazole (PROTONIX) 40 MG tablet TAKE 1 TABLET (40 MG TOTAL) BY MOUTH DAILY. 30 tablet 5   tiZANidine (ZANAFLEX) 4 MG  tablet Take 1 tablet (4 mg total) by mouth every 6 (six) hours as needed for muscle spasms. 20 tablet 0   No current facility-administered medications on file prior to visit.    Observations/Objective: Alert and oriented x 3. Not in acute distress. Physical examination not completed as this is a telemedicine visit.  Assessment and Plan: 1. Chronic nausea: - Continue Ondansetron as prescribed.  - Follow-up with primary provider as scheduled.  - ondansetron (ZOFRAN-ODT) 4 MG disintegrating tablet; Take 1 tablet (4  mg total) by mouth every 8 (eight) hours as needed for nausea or vomiting.  Dispense: 20 tablet; Refill: 2  2. Cough: - Begin Benzonatate as prescribed.  - Counseled patient on Covid testing resources in the community.  - Follow-up with primary provider as scheduled.  - benzonatate (TESSALON PERLES) 100 MG capsule; Take 1 capsule (100 mg total) by mouth 3 (three) times daily as needed for up to 14 days for cough.  Dispense: 28 capsule; Refill: 0  3. Nasal congestion: - Begin Fluticasone as prescribed.  - Counseled patient on Covid testing resources in the community.  - Follow-up with primary provider as scheduled.  - fluticasone (FLONASE) 50 MCG/ACT nasal spray; Place 2 sprays into both nostrils daily.  Dispense: 16 g; Refill: 0  4. Frequent urination: - Counseled patient to report to office when best for her for point-of-care urinalysis and cervicovaginal swab. Patient agreeable.   Follow Up Instructions: Follow-up with primary provider as scheduled.     Patient was given clear instructions to go to Emergency Department or return to medical center if symptoms don't improve, worsen, or new problems develop.The patient verbalized understanding.  I discussed the assessment and treatment plan with the patient. The patient was provided an opportunity to ask questions and all were answered. The patient agreed with the plan and demonstrated an understanding of the instructions.   The patient was advised to call back or seek an in-person evaluation if the symptoms worsen or if the condition fails to improve as anticipated.    I provided 10 minutes total of non-face-to-face time during this encounter.   Camillia Herter, NP  [facility] [place], [place] 11/26/2020, 4:37 PM

## 2020-11-23 NOTE — ED Triage Notes (Signed)
Pt c/o headache, nasal congestion and ear pains for about 4 days.

## 2020-11-23 NOTE — ED Provider Notes (Signed)
Sherri [name]    CSN: [id number] Arrival date & time: 11/23/20  1411      History   Chief Complaint Chief Complaint  Patient presents with   Headache   Nasal Congestion    HPI Sherri [name] is a 35 y.o. female presenting with viral symptoms for 4 days.  Medical history noncontributory.  Notes nasal congestion, cough, bilateral ear pressure left worse than right without hearing changes dizziness or tinnitus. headaches.  Requesting a shot of something to make her feel better.  Minimal relief with Tylenol and ibuprofen at home. Currently menstruating.   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    Urinary tract infection    Uterine fibroid     Patient Active Problem List   Diagnosis Date Noted   Prediabetes 07/05/2020   Gastroesophageal reflux disease 04/26/2020   Perianal rash 03/26/2020   Chronic nausea 03/26/2020   Chronic constipation 03/09/2019   Pelvic pain in female 03/09/2019   Hypertension    Genital herpes    Uterine fibroid 12/25/2013   Obesity 12/25/2013   Bacterial vaginitis 04/27/2012    Past Surgical History:  Procedure Laterality Date   CESAREAN SECTION N/A 06/05/2014   Procedure: CESAREAN SECTION;  Surgeon: Truett Mainland, DO;  Location: [facility];  Service: Obstetrics;  Laterality: N/A;   WISDOM TOOTH EXTRACTION      OB History     Gravida  1   Para  1   Term      Preterm  1   AB      Living  2      SAB      IAB      Ectopic      Multiple  1   Live Births  2            Home Medications    Prior to Admission medications   Medication Sig Start Date End Date Taking? Authorizing Provider  acetaminophen (TYLENOL) 500 MG tablet TAKE 2 TABLETS (1,000 MG TOTAL) BY MOUTH EVERY 6 (SIX) HOURS AS NEEDED FOR MODERATE PAIN, FEVER OR HEADACHE. 04/30/20 04/30/21  Domenic Moras, PA-C   amitriptyline (ELAVIL) 10 MG tablet TAKE 1 TABLET 1-3 HOURS PRIOR TO BEDTIME. INCREASE TO TWO TABLETS EVERY NIGHT AT BEDTIME IN TWO WEEKS IF TOLERATING. 05/21/20 05/21/21  Nicolette Bang, MD  amoxicillin-clavulanate (AUGMENTIN) 875-125 MG tablet Take 1 tablet by mouth every 12 (twelve) hours. 09/24/20   Volney American, PA-C  azelastine (ASTELIN) 0.1 % nasal spray Place 2 sprays into both nostrils 2 (two) times daily. 02/10/20   Ok Edwards, PA-C  Cholecalciferol (VITAMIN D3) 125 MCG (5000 UT) CAPS Take 1 capsule by mouth daily.    [provider]  cyclobenzaprine (FLEXERIL) 10 MG tablet TAKE 1 TABLET (10 MG TOTAL) BY MOUTH AT BEDTIME. 04/26/20 04/26/21  Melynda Ripple, MD  dicyclomine (BENTYL) 20 MG tablet TAKE 1 TABLET (20 MG TOTAL) BY MOUTH 3 (THREE) TIMES DAILY AS NEEDED FOR SPASMS. 04/22/20 04/22/21  Davonna Belling, MD  Ferrous Sulfate (IRON) 28 MG TABS Take 1 tablet by mouth daily.    [provider]  lubiprostone (AMITIZA) 24 MCG capsule Take 1 capsule (24 mcg total) by mouth 2 (two) times daily with a meal. 09/20/20   Ladene Artist, MD  metroNIDAZOLE (METROGEL  VAGINAL) 0.75 % vaginal gel Place 1 Applicatorful vaginally at bedtime. 08/22/20   Camillia Herter, NP  mirtazapine (REMERON) 15 MG tablet TAKE 2 TABLETS (30 MG TOTAL) BY MOUTH AT BEDTIME. 04/26/20 04/26/21  Zehr, Janett Billow D, PA-C  ondansetron (ZOFRAN-ODT) 4 MG disintegrating tablet Take 1 tablet (4 mg total) by mouth every 8 (eight) hours as needed for nausea or vomiting. 10/25/20   Camillia Herter, NP  pantoprazole (PROTONIX) 40 MG tablet TAKE 1 TABLET (40 MG TOTAL) BY MOUTH DAILY. 04/26/20 04/26/21  Zehr, Laban Emperor, PA-C  tiZANidine (ZANAFLEX) 4 MG tablet Take 1 tablet (4 mg total) by mouth every 6 (six) hours as needed for muscle spasms. 10/28/20   Pearson Forster, NP    Family History Family History  Problem Relation Age of Onset   Hypertension Mother    Hypertension Father    Breast cancer Maternal  Grandmother        15   Colon cancer Neg Hx    Colon polyps Neg Hx    Kidney disease Neg Hx    Diabetes Neg Hx    Esophageal cancer Neg Hx    Gallbladder disease Neg Hx    Heart disease Neg Hx    Asthma Neg Hx    Stroke Neg Hx    Stomach cancer Neg Hx     Social History Social History   Tobacco Use   Smoking status: Never   Smokeless tobacco: Never  Vaping Use   Vaping Use: Never used  Substance Use Topics   Alcohol use: No   Drug use: No     Allergies   Macrobid [nitrofurantoin]   Review of Systems Review of Systems  Constitutional:  Negative for appetite change, chills and fever.  HENT:  Positive for congestion. Negative for ear pain, rhinorrhea, sinus pressure, sinus pain and sore throat.   Eyes:  Negative for redness and visual disturbance.  Respiratory:  Positive for cough. Negative for chest tightness, shortness of breath and wheezing.   Cardiovascular:  Negative for chest pain and palpitations.  Gastrointestinal:  Negative for abdominal pain, constipation, diarrhea, nausea and vomiting.  Genitourinary:  Negative for dysuria, frequency and urgency.  Musculoskeletal:  Negative for myalgias.  Neurological:  Negative for dizziness, weakness and headaches.  Psychiatric/Behavioral:  Negative for confusion.   All other systems reviewed and are negative.   Physical Exam Triage Vital Signs ED Triage Vitals  Enc Vitals Group     BP 11/23/20 1436 (!) 144/99     Pulse Rate 11/23/20 1436 (!) 103     Resp 11/23/20 1436 17     Temp 11/23/20 1436 99 F (37.2 C)     Temp Source 11/23/20 1436 Oral     SpO2 11/23/20 1436 100 %     Weight --      Height --      Head Circumference --      Peak Flow --      Pain Score 11/23/20 1434 8     Pain Loc --      Pain Edu? --      Excl. in [place]? --    No data found.  Updated Vital Signs BP (!) 144/99 (BP Location: Left Arm)   Pulse (!) 103   Temp 99 F (37.2 C) (Oral)   Resp 17   LMP 11/20/2020 (Exact Date)   SpO2  100%   Visual Acuity Right Eye Distance:   Left Eye Distance:   Bilateral Distance:  Right Eye Near:   Left Eye Near:    Bilateral Near:     Physical Exam Vitals reviewed.  Constitutional:      General: She is not in acute distress.    Appearance: Normal appearance. She is not ill-appearing or diaphoretic.  HENT:     Head: Normocephalic and atraumatic.  Cardiovascular:     Rate and Rhythm: Regular rhythm. Tachycardia present.     Heart sounds: Normal heart sounds.  Pulmonary:     Effort: Pulmonary effort is normal.     Breath sounds: Normal breath sounds.  Lymphadenopathy:     Cervical: No cervical adenopathy.     Right cervical: No superficial cervical adenopathy.    Left cervical: No superficial cervical adenopathy.  Skin:    General: Skin is warm.  Neurological:     General: No focal deficit present.     Mental Status: She is alert and oriented to person, place, and time.  Psychiatric:        Mood and Affect: Mood normal.        Behavior: Behavior normal.        Thought Content: Thought content normal.        Judgment: Judgment normal.     UC Treatments / Results  Labs (all labs ordered are listed, but only abnormal results are displayed) Labs Reviewed - No data to display  EKG   Radiology No results found.  Procedures Procedures (including critical care time)  Medications Ordered in UC Medications  methylPREDNISolone sodium succinate (SOLU-MEDROL) 125 mg/2 mL injection 60 mg (has no administration in time range)    Initial Impression / Assessment and Plan / UC Course  I have reviewed the triage vital signs and the nursing notes.  Pertinent labs & imaging results that were available during my care of the patient were reviewed by me and considered in my medical decision making (see chart for details).     This patient is a very pleasant 35 y.o. year old female presenting with viral URI with cough.  Today this pt is tachycardic at 103 but afebrile  nontachypneic, oxygenating well on room air, no wheezes rhonchi or rales.   States she is not pregnant or breastfeeding. LMP today   Requesting shot of solumedrol due to budget constraints/ cannot afford medication. Administered.   ED return precautions discussed. Patient verbalizes understanding and agreement.   Coding Level 4 for acute illness with systemic symptoms, and prescription drug management  Final Clinical Impressions(s) / UC Diagnoses   Final diagnoses:  Viral URI with cough     Discharge Instructions      -You can take Tylenol up to 1000 mg 3 times daily, and ibuprofen up to 600 mg 3 times daily with food.  You can take these together, or alternate every 3-4 hours. -With a virus, you're typically contagious for 5-7 days, or as long as you're having fevers.        ED Prescriptions   None    PDMP not reviewed this encounter.   Hazel Sams, PA-C 11/23/20 1454

## 2020-11-23 NOTE — Discharge Instructions (Addendum)
-  You can take Tylenol up to 1000 mg 3 times daily, and ibuprofen up to 600 mg 3 times daily with food.  You can take these together, or alternate every 3-4 hours. -With a virus, you're typically contagious for 5-7 days, or as long as you're having fevers.

## 2020-11-25 ENCOUNTER — Telehealth: Payer: Self-pay | Admitting: Family

## 2020-11-25 NOTE — Telephone Encounter (Signed)
.  [name], rieth are scheduled for a virtual visit with your provider today.    Just as we do with appointments in the office, we must obtain your consent to participate.  Your consent will be active for this visit and any virtual visit you may have with one of our providers in the next 365 days.    If you have a MyChart account, I can also send a copy of this consent to you electronically.  All virtual visits are billed to your insurance company just like a traditional visit in the office.  As this is a virtual visit, video technology does not allow for your provider to perform a traditional examination.  This may limit your provider's ability to fully assess your condition.  If your provider identifies any concerns that need to be evaluated in person or the need to arrange testing such as labs, EKG, etc, we will make arrangements to do so.    Although advances in technology are sophisticated, we cannot ensure that it will always work on either your end or our end.  If the connection with a video visit is poor, we may have to switch to a telephone visit.  With either a video or telephone visit, we are not always able to ensure that we have a secure connection.   I need to obtain your verbal consent now.   Are you willing to proceed with your visit today?   Sherri [name] has provided verbal consent on 11/25/2020 for a virtual visit (video or telephone).   [name] 11/25/2020  1:55 PM

## 2020-11-26 ENCOUNTER — Encounter: Payer: Self-pay | Admitting: Family

## 2020-11-26 ENCOUNTER — Other Ambulatory Visit: Payer: Self-pay

## 2020-11-26 ENCOUNTER — Telehealth (INDEPENDENT_AMBULATORY_CARE_PROVIDER_SITE_OTHER): Payer: Self-pay | Admitting: Family

## 2020-11-26 DIAGNOSIS — R110 Nausea: Secondary | ICD-10-CM

## 2020-11-26 DIAGNOSIS — R350 Frequency of micturition: Secondary | ICD-10-CM

## 2020-11-26 DIAGNOSIS — R0981 Nasal congestion: Secondary | ICD-10-CM

## 2020-11-26 DIAGNOSIS — R059 Cough, unspecified: Secondary | ICD-10-CM

## 2020-11-26 MED ORDER — BENZONATATE 100 MG PO CAPS
100.0000 mg | ORAL_CAPSULE | Freq: Three times a day (TID) | ORAL | 0 refills | Status: AC | PRN
Start: 2020-11-26 — End: 2020-12-10
  Filled 2020-11-26: qty 28, 10d supply, fill #0

## 2020-11-26 MED ORDER — ONDANSETRON 4 MG PO TBDP
4.0000 mg | ORAL_TABLET | Freq: Three times a day (TID) | ORAL | 2 refills | Status: DC | PRN
  Filled 2020-11-26: qty 20, 7d supply, fill #0
  Filled 2020-12-19: qty 20, 7d supply, fill #1
  Filled 2020-12-30 – 2020-12-31 (×2): qty 20, 7d supply, fill #2

## 2020-11-26 MED ORDER — FLUTICASONE PROPIONATE 50 MCG/ACT NA SUSP
2.0000 | Freq: Every day | NASAL | 0 refills | Status: DC
  Filled 2020-11-26: qty 16, 30d supply, fill #0

## 2020-11-26 NOTE — Progress Notes (Signed)
Pt presents telemedicine visit for nausea, pt reports symptoms going on for sometime. Requested refill of Zofran on 08/24

## 2020-11-27 ENCOUNTER — Other Ambulatory Visit: Payer: Self-pay

## 2020-11-29 ENCOUNTER — Other Ambulatory Visit: Payer: Self-pay

## 2020-11-29 ENCOUNTER — Ambulatory Visit: Payer: Self-pay

## 2020-11-29 ENCOUNTER — Ambulatory Visit
Admission: EM | Admit: 2020-11-29 | Discharge: 2020-11-29 | Disposition: A | Discharge status: Home / Self Care | Payer: Self-pay | Attending: Urgent Care | Admitting: Urgent Care

## 2020-11-29 ENCOUNTER — Other Ambulatory Visit (HOSPITAL_COMMUNITY)
Admission: RE | Admit: 2020-11-29 | Discharge: 2020-11-29 | Disposition: A | Discharge status: Home / Self Care | Payer: Self-pay | Source: Ambulatory Visit | Attending: Family | Admitting: Family

## 2020-11-29 DIAGNOSIS — Z20822 Contact with and (suspected) exposure to covid-19: Secondary | ICD-10-CM

## 2020-11-29 DIAGNOSIS — J018 Other acute sinusitis: Secondary | ICD-10-CM

## 2020-11-29 DIAGNOSIS — R350 Frequency of micturition: Secondary | ICD-10-CM | POA: Insufficient documentation

## 2020-11-29 LAB — POCT URINALYSIS DIP (CLINITEK)
Bilirubin, UA: NEGATIVE
Blood, UA: NEGATIVE
Glucose, UA: NEGATIVE mg/dL
Ketones, POC UA: NEGATIVE mg/dL
Leukocytes, UA: NEGATIVE
Nitrite, UA: NEGATIVE
POC PROTEIN,UA: NEGATIVE
Spec Grav, UA: >=1.03 — AB (ref 1.010–1.025)
Urobilinogen, UA: 0.2 E.U./dL
pH, UA: 5.5 (ref 5.0–8.0)

## 2020-11-29 MED ORDER — AMOXICILLIN 875 MG PO TABS
875.0000 mg | ORAL_TABLET | Freq: Two times a day (BID) | ORAL | 0 refills | Status: DC
  Filled 2020-11-29: qty 14, 7d supply, fill #0

## 2020-11-29 MED ORDER — CETIRIZINE HCL 10 MG PO TABS
10.0000 mg | ORAL_TABLET | Freq: Every day | ORAL | 0 refills | Status: DC
  Filled 2020-11-29: qty 30, 30d supply, fill #0

## 2020-11-29 MED ORDER — PSEUDOEPHEDRINE HCL 60 MG PO TABS
60.0000 mg | ORAL_TABLET | Freq: Three times a day (TID) | ORAL | 0 refills | Status: DC | PRN
  Filled 2020-11-29: qty 30, 10d supply, fill #0

## 2020-11-29 NOTE — Addendum Note (Signed)
Addended by: Elmon Else on: 11/29/2020 09:38 AM   Modules accepted: Orders

## 2020-11-29 NOTE — Progress Notes (Signed)
No urinary tract infection. Reminder to remain hydrated with water.

## 2020-11-29 NOTE — ED Provider Notes (Signed)
Kermit   MRN: [id number] DOB: [birth year]  Subjective:   Sherri [name] is a 35 y.o. female presenting for 10-day history of acute onset persistent sinus congestion, sinus pressure, facial pain, bilateral ear fullness and mild cough.  Has also had body aches in the past week.  She was seen at 11/23/2020, was advised supportive care for viral respiratory illness.  States that she is taking Zyrtec and Sudafed, Tylenol and ibuprofen and is not helping.  She is not opposed to COVID test.  No current facility-administered medications for this encounter.  Current Outpatient Medications:    acetaminophen (TYLENOL) 500 MG tablet, TAKE 2 TABLETS (1,000 MG TOTAL) BY MOUTH EVERY 6 (SIX) HOURS AS NEEDED FOR MODERATE PAIN, FEVER OR HEADACHE., Disp: 30 tablet, Rfl: 0   amitriptyline (ELAVIL) 10 MG tablet, TAKE 1 TABLET 1-3 HOURS PRIOR TO BEDTIME. INCREASE TO TWO TABLETS EVERY NIGHT AT BEDTIME IN TWO WEEKS IF TOLERATING., Disp: 90 tablet, Rfl: 1   azelastine (ASTELIN) 0.1 % nasal spray, Place 2 sprays into both nostrils 2 (two) times daily., Disp: 30 mL, Rfl: 0   benzonatate (TESSALON PERLES) 100 MG capsule, Take 1 capsule (100 mg total) by mouth 3 (three) times daily as needed for up to 14 days for cough., Disp: 28 capsule, Rfl: 0   Cholecalciferol (VITAMIN D3) 125 MCG (5000 UT) CAPS, Take 1 capsule by mouth daily., Disp: , Rfl:    cyclobenzaprine (FLEXERIL) 10 MG tablet, TAKE 1 TABLET (10 MG TOTAL) BY MOUTH AT BEDTIME., Disp: 20 tablet, Rfl: 0   dicyclomine (BENTYL) 20 MG tablet, TAKE 1 TABLET (20 MG TOTAL) BY MOUTH 3 (THREE) TIMES DAILY AS NEEDED FOR SPASMS., Disp: 10 tablet, Rfl: 0   Ferrous Sulfate (IRON) 28 MG TABS, Take 1 tablet by mouth daily., Disp: , Rfl:    fluticasone (FLONASE) 50 MCG/ACT nasal spray, Place 2 sprays into both nostrils daily., Disp: 16 g, Rfl: 0   lubiprostone (AMITIZA) 24 MCG capsule, Take 1 capsule (24 mcg total) by mouth 2 (two) times daily with a meal., Disp:  60 capsule, Rfl: 3   mirtazapine (REMERON) 15 MG tablet, TAKE 2 TABLETS (30 MG TOTAL) BY MOUTH AT BEDTIME., Disp: 30 tablet, Rfl: 1   ondansetron (ZOFRAN-ODT) 4 MG disintegrating tablet, Take 1 tablet (4 mg total) by mouth every 8 (eight) hours as needed for nausea or vomiting., Disp: 20 tablet, Rfl: 2   pantoprazole (PROTONIX) 40 MG tablet, TAKE 1 TABLET (40 MG TOTAL) BY MOUTH DAILY., Disp: 30 tablet, Rfl: 5   tiZANidine (ZANAFLEX) 4 MG tablet, Take 1 tablet (4 mg total) by mouth every 6 (six) hours as needed for muscle spasms., Disp: 20 tablet, Rfl: 0   Allergies  Allergen Reactions   Macrobid [Nitrofurantoin] Nausea And Vomiting    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    Urinary tract infection    Uterine fibroid      Past Surgical History:  Procedure Laterality Date   CESAREAN SECTION N/A 06/05/2014   Procedure: CESAREAN SECTION;  Surgeon: Truett Mainland, DO;  Location: [facility];  Service: Obstetrics;  Laterality: N/A;   WISDOM TOOTH EXTRACTION      Family History  Problem Relation Age of Onset   Hypertension Mother    Hypertension Father    Breast cancer Maternal Grandmother  70   Colon cancer Neg Hx    Colon polyps Neg Hx    Kidney disease Neg Hx    Diabetes Neg Hx    Esophageal cancer Neg Hx    Gallbladder disease Neg Hx    Heart disease Neg Hx    Asthma Neg Hx    Stroke Neg Hx    Stomach cancer Neg Hx     Social History   Tobacco Use   Smoking status: Never   Smokeless tobacco: Never  Vaping Use   Vaping Use: Never used  Substance Use Topics   Alcohol use: No   Drug use: No    ROS   Objective:   Vitals: BP (!) 148/96   Pulse 85   Temp 98.5 F (36.9 C) (Oral)   Resp 18   LMP 11/20/2020 (Exact Date)   SpO2 97%   Physical Exam Constitutional:      General: She is not in acute distress.     Appearance: Normal appearance. She is well-developed. She is not ill-appearing, toxic-appearing or diaphoretic.  HENT:     Head: Normocephalic and atraumatic.     Right Ear: Tympanic membrane, ear canal and external ear normal. No drainage or tenderness. No middle ear effusion. Tympanic membrane is not erythematous.     Left Ear: Tympanic membrane, ear canal and external ear normal. No drainage or tenderness.  No middle ear effusion. Tympanic membrane is not erythematous.     Nose: Nose normal. No congestion or rhinorrhea.     Mouth/Throat:     Mouth: Mucous membranes are moist. No oral lesions.     Pharynx: Oropharynx is clear. No pharyngeal swelling, oropharyngeal exudate, posterior oropharyngeal erythema or uvula swelling.     Tonsils: No tonsillar exudate or tonsillar abscesses.  Eyes:     General: No scleral icterus.       Right eye: No discharge.        Left eye: No discharge.     Extraocular Movements: Extraocular movements intact.     Right eye: Normal extraocular motion.     Left eye: Normal extraocular motion.     Conjunctiva/sclera: Conjunctivae normal.     Pupils: Pupils are equal, round, and reactive to light.  Cardiovascular:     Rate and Rhythm: Normal rate and regular rhythm.     Pulses: Normal pulses.     Heart sounds: Normal heart sounds. No murmur heard.   No friction rub. No gallop.  Pulmonary:     Effort: Pulmonary effort is normal. No respiratory distress.     Breath sounds: Normal breath sounds. No stridor. No wheezing, rhonchi or rales.  Musculoskeletal:     Cervical back: Normal range of motion and neck supple.  Lymphadenopathy:     Cervical: No cervical adenopathy.  Skin:    General: Skin is warm and dry.     Findings: No rash.  Neurological:     General: No focal deficit present.     Mental Status: She is alert and oriented to person, place, and time.  Psychiatric:        Mood and Affect: Mood normal.        Behavior: Behavior normal.        Thought  Content: Thought content normal.    Results for orders placed or performed in visit on 11/26/20 (from the past 24 hour(s))  POCT URINALYSIS DIP (CLINITEK)     Status: Abnormal   Collection Time: 11/29/20  9:30 AM  Result  Value Ref Range   Color, UA yellow yellow   Clarity, UA clear clear   Glucose, UA negative negative mg/dL   Bilirubin, UA negative negative   Ketones, POC UA negative negative mg/dL   Spec Grav, UA >=1.030 (A) 1.010 - 1.025   Blood, UA negative negative   pH, UA 5.5 5.0 - 8.0   POC PROTEIN,UA negative negative, trace   Urobilinogen, UA 0.2 0.2 or 1.0 E.U./dL   Nitrite, UA Negative Negative   Leukocytes, UA Negative Negative    Assessment and Plan :   PDMP not reviewed this encounter.  1. Acute non-recurrent sinusitis of other sinus   2. Encounter for screening laboratory testing for COVID-19 virus     Will start empiric treatment for sinusitis with amoxicillin.  Recommended supportive care otherwise including the use of oral antihistamine, decongestant.  Deferred imaging given clear cardiopulmonary exam, reassuring vital signs.  Counseled patient on potential for adverse effects with medications prescribed/recommended today, ER and return-to-clinic precautions discussed, patient verbalized understanding.    Jaynee Eagles, [place] 11/29/20 1044

## 2020-11-29 NOTE — ED Triage Notes (Signed)
Pt c/o runny nose, facial pressure, bilateral ear pressure, slight cough, and body aches x1wk. Taking OTC meds with no relief.

## 2020-12-01 LAB — SARS-COV-2, NAA 2 DAY TAT

## 2020-12-01 LAB — NOVEL CORONAVIRUS, NAA: SARS-CoV-2, NAA: NOT DETECTED

## 2020-12-02 LAB — CERVICOVAGINAL ANCILLARY ONLY
Bacterial Vaginitis (gardnerella): NEGATIVE
Candida Glabrata: NEGATIVE
Candida Vaginitis: NEGATIVE
Chlamydia: NEGATIVE
Comment: NEGATIVE
Comment: NEGATIVE
Comment: NEGATIVE
Comment: NEGATIVE
Comment: NEGATIVE
Comment: NORMAL
Neisseria Gonorrhea: NEGATIVE
Trichomonas: NEGATIVE

## 2020-12-02 NOTE — Progress Notes (Signed)
Chlamydia, Gonorrhea, Trichomonas, Bacterial Vaginitis, and Candida Vaginitis (sometimes called yeast infection) negative.

## 2020-12-05 ENCOUNTER — Other Ambulatory Visit: Payer: Self-pay

## 2020-12-05 ENCOUNTER — Ambulatory Visit
Admission: EM | Admit: 2020-12-05 | Discharge: 2020-12-05 | Disposition: A | Discharge status: Home / Self Care | Payer: Self-pay | Attending: Urgent Care | Admitting: Urgent Care

## 2020-12-05 ENCOUNTER — Encounter: Payer: Self-pay | Admitting: Emergency Medicine

## 2020-12-05 DIAGNOSIS — J3489 Other specified disorders of nose and nasal sinuses: Secondary | ICD-10-CM

## 2020-12-05 DIAGNOSIS — R0981 Nasal congestion: Secondary | ICD-10-CM

## 2020-12-05 DIAGNOSIS — H9202 Otalgia, left ear: Secondary | ICD-10-CM

## 2020-12-05 DIAGNOSIS — R051 Acute cough: Secondary | ICD-10-CM

## 2020-12-05 DIAGNOSIS — J018 Other acute sinusitis: Secondary | ICD-10-CM

## 2020-12-05 MED ORDER — METHYLPREDNISOLONE ACETATE 80 MG/ML IJ SUSP
80.0000 mg | Freq: Once | INTRAMUSCULAR | Status: AC
  Administered 2020-12-05: 80 mg via INTRAMUSCULAR

## 2020-12-05 NOTE — ED Triage Notes (Signed)
Patient c/o left ear pain, cough and congestion x 2 weeks.  Patient has completed a round of antbs w/o relief.  Tylenol, Ibuprofen, Sudafed and nasal spray w/o relief.

## 2020-12-05 NOTE — ED Provider Notes (Signed)
Sherri [name]   MRN: [id number] DOB: Mar 07, 1985  Subjective:   Sherri [name] is a 35 y.o. female presenting for recheck on persistent left ear pain, sinus pressure, sinus congestion.  She also has a cough still but her primary concern is about her sinuses and left ear pain.  She underwent a low-dose of a steroid injection November 23, 2020.  She had very intermittent relief and was seen again at the end of September.  She was prescribed amoxicillin and completed this.  Denies chest pain, shortness of breath, wheezing, ear drainage.  She is using Zyrtec, Sudafed, nasal spray.  Would like medications administered in clinic as she has difficulty pain for outpatient medications.  No current facility-administered medications for this encounter.  Current Outpatient Medications:    acetaminophen (TYLENOL) 500 MG tablet, TAKE 2 TABLETS (1,000 MG TOTAL) BY MOUTH EVERY 6 (SIX) HOURS AS NEEDED FOR MODERATE PAIN, FEVER OR HEADACHE., Disp: 30 tablet, Rfl: 0   amitriptyline (ELAVIL) 10 MG tablet, TAKE 1 TABLET 1-3 HOURS PRIOR TO BEDTIME. INCREASE TO TWO TABLETS EVERY NIGHT AT BEDTIME IN TWO WEEKS IF TOLERATING., Disp: 90 tablet, Rfl: 1   amoxicillin (AMOXIL) 875 MG tablet, Take 1 tablet (875 mg total) by mouth 2 (two) times daily., Disp: 14 tablet, Rfl: 0   azelastine (ASTELIN) 0.1 % nasal spray, Place 2 sprays into both nostrils 2 (two) times daily., Disp: 30 mL, Rfl: 0   benzonatate (TESSALON PERLES) 100 MG capsule, Take 1 capsule (100 mg total) by mouth 3 (three) times daily as needed for up to 14 days for cough., Disp: 28 capsule, Rfl: 0   cetirizine (ZYRTEC ALLERGY) 10 MG tablet, Take 1 tablet (10 mg total) by mouth daily., Disp: 30 tablet, Rfl: 0   Cholecalciferol (VITAMIN D3) 125 MCG (5000 UT) CAPS, Take 1 capsule by mouth daily., Disp: , Rfl:    cyclobenzaprine (FLEXERIL) 10 MG tablet, TAKE 1 TABLET (10 MG TOTAL) BY MOUTH AT BEDTIME., Disp: 20 tablet, Rfl: 0   dicyclomine (BENTYL) 20 MG  tablet, TAKE 1 TABLET (20 MG TOTAL) BY MOUTH 3 (THREE) TIMES DAILY AS NEEDED FOR SPASMS., Disp: 10 tablet, Rfl: 0   Ferrous Sulfate (IRON) 28 MG TABS, Take 1 tablet by mouth daily., Disp: , Rfl:    fluticasone (FLONASE) 50 MCG/ACT nasal spray, Place 2 sprays into both nostrils daily., Disp: 16 g, Rfl: 0   lubiprostone (AMITIZA) 24 MCG capsule, Take 1 capsule (24 mcg total) by mouth 2 (two) times daily with a meal., Disp: 60 capsule, Rfl: 3   mirtazapine (REMERON) 15 MG tablet, TAKE 2 TABLETS (30 MG TOTAL) BY MOUTH AT BEDTIME., Disp: 30 tablet, Rfl: 1   ondansetron (ZOFRAN-ODT) 4 MG disintegrating tablet, Take 1 tablet (4 mg total) by mouth every 8 (eight) hours as needed for nausea or vomiting., Disp: 20 tablet, Rfl: 2   pantoprazole (PROTONIX) 40 MG tablet, TAKE 1 TABLET (40 MG TOTAL) BY MOUTH DAILY., Disp: 30 tablet, Rfl: 5   pseudoephedrine (SUDAFED) 60 MG tablet, Take 1 tablet (60 mg total) by mouth every 8 (eight) hours as needed for congestion., Disp: 30 tablet, Rfl: 0   tiZANidine (ZANAFLEX) 4 MG tablet, Take 1 tablet (4 mg total) by mouth every 6 (six) hours as needed for muscle spasms., Disp: 20 tablet, Rfl: 0   Allergies  Allergen Reactions   Macrobid [Nitrofurantoin] Nausea And Vomiting    Past Medical History:  Diagnosis Date   Fibromyalgia    denies today  Genital herpes    Hypertension    gestational   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    Urinary tract infection    Uterine fibroid      Past Surgical History:  Procedure Laterality Date   CESAREAN SECTION N/A 06/05/2014   Procedure: CESAREAN SECTION;  Surgeon: Truett Mainland, DO;  Location: [facility];  Service: Obstetrics;  Laterality: N/A;   WISDOM TOOTH EXTRACTION      Family History  Problem Relation Age of Onset   Hypertension Mother    Hypertension Father    Breast cancer Maternal Grandmother        64   Colon cancer Neg Hx    Colon polyps Neg Hx    Kidney disease Neg Hx    Diabetes  Neg Hx    Esophageal cancer Neg Hx    Gallbladder disease Neg Hx    Heart disease Neg Hx    Asthma Neg Hx    Stroke Neg Hx    Stomach cancer Neg Hx     Social History   Tobacco Use   Smoking status: Never   Smokeless tobacco: Never  Vaping Use   Vaping Use: Never used  Substance Use Topics   Alcohol use: No   Drug use: No    ROS   Objective:   Vitals: BP (!) 142/83 (BP Location: Left Arm)   Pulse 75   Temp 98.5 F (36.9 C) (Oral)   Ht 4\' 11"  (1.499 m)   Wt 180 lb (81.6 kg)   LMP 11/20/2020 (Exact Date)   SpO2 97%   BMI 36.36 kg/m   Physical Exam Constitutional:      General: She is not in acute distress.    Appearance: Normal appearance. She is well-developed. She is not ill-appearing, toxic-appearing or diaphoretic.  HENT:     Head: Normocephalic and atraumatic.     Right Ear: Tympanic membrane, ear canal and external ear normal. No drainage or tenderness. No middle ear effusion. There is no impacted cerumen. Tympanic membrane is not erythematous.     Left Ear: Tympanic membrane, ear canal and external ear normal. No drainage or tenderness.  No middle ear effusion. There is no impacted cerumen. Tympanic membrane is not erythematous.     Nose: Congestion present. No rhinorrhea.     Mouth/Throat:     Mouth: Mucous membranes are moist. No oral lesions.     Pharynx: Oropharynx is clear. No pharyngeal swelling, oropharyngeal exudate, posterior oropharyngeal erythema or uvula swelling.     Tonsils: No tonsillar exudate or tonsillar abscesses.  Eyes:     General: No scleral icterus.       Right eye: No discharge.        Left eye: No discharge.     Extraocular Movements: Extraocular movements intact.     Right eye: Normal extraocular motion.     Left eye: Normal extraocular motion.     Conjunctiva/sclera: Conjunctivae normal.     Pupils: Pupils are equal, round, and reactive to light.  Cardiovascular:     Rate and Rhythm: Normal rate and regular rhythm.      Pulses: Normal pulses.     Heart sounds: Normal heart sounds. No murmur heard.   No friction rub. No gallop.  Pulmonary:     Effort: Pulmonary effort is normal. No respiratory distress.     Breath sounds: Normal breath sounds. No stridor. No wheezing, rhonchi or rales.  Musculoskeletal:     Cervical back:  Normal range of motion and neck supple.  Lymphadenopathy:     Cervical: No cervical adenopathy.  Skin:    General: Skin is warm and dry.     Findings: No rash.  Neurological:     General: No focal deficit present.     Mental Status: She is alert and oriented to person, place, and time.  Psychiatric:        Mood and Affect: Mood normal.        Behavior: Behavior normal.        Thought Content: Thought content normal.        Judgment: Judgment normal.      Assessment and Plan :   PDMP not reviewed this encounter.  1. Acute non-recurrent sinusitis of other sinus   2. Left ear pain   3. Sinus congestion   4. Sinus pressure   5. Acute cough     Patient has an unremarkable exam, clear cardiopulmonary exam.  Deferred imaging.  Her primary complaint is in her sinuses and left ear.  Offered her 1 more steroid injection but needs to follow-up with an ear nose throat specialist.  She is agreeable to this, will use IM Depo-Medrol 80 mg.  Maintain Zyrtec, Astelin.  Follow-up with ENT. Counseled patient on potential for adverse effects with medications prescribed/recommended today, ER and return-to-clinic precautions discussed, patient verbalized understanding.    Jaynee Eagles, PA-C 12/05/20 1453

## 2020-12-19 ENCOUNTER — Other Ambulatory Visit: Payer: Self-pay

## 2020-12-25 ENCOUNTER — Other Ambulatory Visit: Payer: Self-pay

## 2020-12-25 ENCOUNTER — Encounter (HOSPITAL_COMMUNITY): Payer: Self-pay

## 2020-12-25 ENCOUNTER — Ambulatory Visit (HOSPITAL_COMMUNITY)
Admission: EM | Admit: 2020-12-25 | Discharge: 2020-12-25 | Disposition: A | Discharge status: Home / Self Care | Payer: Self-pay | Attending: Physician Assistant | Admitting: Physician Assistant

## 2020-12-25 DIAGNOSIS — R1084 Generalized abdominal pain: Secondary | ICD-10-CM

## 2020-12-25 DIAGNOSIS — R194 Change in bowel habit: Secondary | ICD-10-CM

## 2020-12-25 DIAGNOSIS — R110 Nausea: Secondary | ICD-10-CM

## 2020-12-25 MED ORDER — HYOSCYAMINE SULFATE SL 0.125 MG SL SUBL
0.1250 mg | SUBLINGUAL_TABLET | Freq: Two times a day (BID) | SUBLINGUAL | 0 refills | Status: DC | PRN
  Filled 2020-12-25: qty 30, 15d supply, fill #0

## 2020-12-25 MED ORDER — ALUM & MAG HYDROXIDE-SIMETH 200-200-20 MG/5ML PO SUSP
ORAL | Status: AC
  Filled 2020-12-25: qty 30

## 2020-12-25 MED ORDER — ALUM & MAG HYDROXIDE-SIMETH 200-200-20 MG/5ML PO SUSP
30.0000 mL | Freq: Once | ORAL | Status: AC
  Administered 2020-12-25: 30 mL via ORAL

## 2020-12-25 MED ORDER — LIDOCAINE VISCOUS HCL 2 % MT SOLN
OROMUCOSAL | Status: AC
  Filled 2020-12-25: qty 15

## 2020-12-25 MED ORDER — LIDOCAINE VISCOUS HCL 2 % MT SOLN
15.0000 mL | Freq: Once | OROMUCOSAL | Status: AC
  Administered 2020-12-25: 15 mL via ORAL

## 2020-12-25 NOTE — ED Provider Notes (Signed)
[facility]    CSN: [id number] Arrival date & time: 12/25/20  1443      History   Chief Complaint Chief Complaint  Patient presents with   Abdominal Pain    HPI Sherri [name] is a 35 y.o. female.   Patient presents today with a 1 week history of generalized abdominal pain.  She reports associated change in bowel habit where she was having loose/watery stools with last bowel movement approximately 2 days ago.  She does have a history of chronic nausea and constipation for which she has seen [facility] in the past most recently July 2022.  Had barium enema study that was normal.  She reports abdominal pain is increased compared to baseline and is rated 6 on a 0-10 pain scale, generalized throughout abdomen, described as soreness/cramping, no alleviating factors identified.  She denies any unusual food intake, recent travel, medication change, antibiotic use.  She has had cesarean section but denies additional abdominal surgeries.  She denies any vomiting, melena, hematochezia, fever, severe abdominal pain.  She is eating and drinking normally.   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    Urinary tract infection    Uterine fibroid     Patient Active Problem List   Diagnosis Date Noted   Prediabetes 07/05/2020   Gastroesophageal reflux disease 04/26/2020   Perianal rash 03/26/2020   Chronic nausea 03/26/2020   Chronic constipation 03/09/2019   Pelvic pain in female 03/09/2019   Hypertension    Genital herpes    Uterine fibroid 12/25/2013   Obesity 12/25/2013   Bacterial vaginitis 04/27/2012    Past Surgical History:  Procedure Laterality Date   CESAREAN SECTION N/A 06/05/2014   Procedure: CESAREAN SECTION;  Surgeon: Truett Mainland, DO;  Location: [facility];  Service: Obstetrics;  Laterality: N/A;   WISDOM TOOTH  EXTRACTION      OB History     Gravida  1   Para  1   Term      Preterm  1   AB      Living  2      SAB      IAB      Ectopic      Multiple  1   Live Births  2            Home Medications    Prior to Admission medications   Medication Sig Start Date End Date Taking? Authorizing Provider  Hyoscyamine Sulfate SL (LEVSIN/SL) 0.125 MG SUBL Place 0.125 mg under the tongue 2 (two) times daily as needed. 12/25/20  Yes Marwah Disbro, Derry Skill, PA-C  acetaminophen (TYLENOL) 500 MG tablet TAKE 2 TABLETS (1,000 MG TOTAL) BY MOUTH EVERY 6 (SIX) HOURS AS NEEDED FOR MODERATE PAIN, FEVER OR HEADACHE. 04/30/20 04/30/21  Domenic Moras, PA-C  amitriptyline (ELAVIL) 10 MG tablet TAKE 1 TABLET 1-3 HOURS PRIOR TO BEDTIME. INCREASE TO TWO TABLETS EVERY NIGHT AT BEDTIME IN TWO WEEKS IF TOLERATING. 05/21/20 05/21/21  Nicolette Bang, MD  azelastine (ASTELIN) 0.1 % nasal spray Place 2 sprays into both nostrils 2 (two) times daily. 02/10/20   Tasia Catchings, Amy V, PA-C  cetirizine (ZYRTEC ALLERGY) 10 MG tablet Take 1 tablet (10 mg total) by mouth daily. 11/29/20   Jaynee Eagles, PA-C  Cholecalciferol (VITAMIN D3) 125 MCG (5000 UT) CAPS Take  1 capsule by mouth daily.    [provider]  cyclobenzaprine (FLEXERIL) 10 MG tablet TAKE 1 TABLET (10 MG TOTAL) BY MOUTH AT BEDTIME. 04/26/20 04/26/21  Melynda Ripple, MD  Ferrous Sulfate (IRON) 28 MG TABS Take 1 tablet by mouth daily.    [provider]  fluticasone (FLONASE) 50 MCG/ACT nasal spray Place 2 sprays into both nostrils daily. 11/26/20   Camillia Herter, NP  lubiprostone (AMITIZA) 24 MCG capsule Take 1 capsule (24 mcg total) by mouth 2 (two) times daily with a meal. 09/20/20   Ladene Artist, MD  mirtazapine (REMERON) 15 MG tablet TAKE 2 TABLETS (30 MG TOTAL) BY MOUTH AT BEDTIME. 04/26/20 04/26/21  Zehr, Janett Billow D, PA-C  ondansetron (ZOFRAN-ODT) 4 MG disintegrating tablet Take 1 tablet (4 mg total) by mouth every 8 (eight) hours as needed for  nausea or vomiting. 11/26/20   Camillia Herter, NP  pantoprazole (PROTONIX) 40 MG tablet TAKE 1 TABLET (40 MG TOTAL) BY MOUTH DAILY. 04/26/20 04/26/21  Zehr, Laban Emperor, PA-C  pseudoephedrine (SUDAFED) 60 MG tablet Take 1 tablet (60 mg total) by mouth every 8 (eight) hours as needed for congestion. 11/29/20   Jaynee Eagles, PA-C  tiZANidine (ZANAFLEX) 4 MG tablet Take 1 tablet (4 mg total) by mouth every 6 (six) hours as needed for muscle spasms. 10/28/20   Pearson Forster, NP    Family History Family History  Problem Relation Age of Onset   Hypertension Mother    Hypertension Father    Breast cancer Maternal Grandmother        52   Colon cancer Neg Hx    Colon polyps Neg Hx    Kidney disease Neg Hx    Diabetes Neg Hx    Esophageal cancer Neg Hx    Gallbladder disease Neg Hx    Heart disease Neg Hx    Asthma Neg Hx    Stroke Neg Hx    Stomach cancer Neg Hx     Social History Social History   Tobacco Use   Smoking status: Never   Smokeless tobacco: Never  Vaping Use   Vaping Use: Never used  Substance Use Topics   Alcohol use: No   Drug use: No     Allergies   Macrobid [nitrofurantoin]   Review of Systems Review of Systems  Constitutional:  Positive for activity change. Negative for appetite change, fatigue and fever.  Respiratory:  Negative for cough and shortness of breath.   Cardiovascular:  Negative for chest pain.  Gastrointestinal:  Positive for abdominal pain, constipation (chronic), diarrhea and nausea (chronic). Negative for vomiting.  Genitourinary:  Negative for dysuria, frequency, urgency, vaginal bleeding, vaginal discharge and vaginal pain.  Musculoskeletal:  Negative for arthralgias, back pain and myalgias.  Neurological:  Negative for dizziness, light-headedness and headaches.    Physical Exam Triage Vital Signs ED Triage Vitals  Enc Vitals Group     BP 12/25/20 1637 135/77     Pulse Rate 12/25/20 1637 80     Resp 12/25/20 1637 17     Temp 12/25/20  1637 98.3 F (36.8 C)     Temp Source 12/25/20 1637 Oral     SpO2 12/25/20 1637 98 %     Weight --      Height --      Head Circumference --      Peak Flow --      Pain Score 12/25/20 1639 6     Pain Loc --  Pain Edu? --      Excl. in [place]? --    No data found.  Updated Vital Signs BP 135/77 (BP Location: Right Arm)   Pulse 80   Temp 98.3 F (36.8 C) (Oral)   Resp 17   LMP 12/16/2020   SpO2 98%   Visual Acuity Right Eye Distance:   Left Eye Distance:   Bilateral Distance:    Right Eye Near:   Left Eye Near:    Bilateral Near:     Physical Exam Vitals reviewed.  Constitutional:      General: She is awake. She is not in acute distress.    Appearance: Normal appearance. She is well-developed. She is not ill-appearing.     Comments: Very pleasant female appears stated age in no acute distress sitting comfortably in exam room  HENT:     Head: Normocephalic and atraumatic.  Cardiovascular:     Rate and Rhythm: Normal rate and regular rhythm.     Heart sounds: Normal heart sounds, S1 normal and S2 normal. No murmur heard. Pulmonary:     Effort: Pulmonary effort is normal.     Breath sounds: Normal breath sounds. No wheezing, rhonchi or rales.     Comments: Clear to auscultation bilaterally Abdominal:     General: Bowel sounds are normal.     Palpations: Abdomen is soft.     Tenderness: There is generalized abdominal tenderness. There is no right CVA tenderness, left CVA tenderness, guarding or rebound.     Comments: Mild tenderness to palpation throughout abdomen.  No evidence of acute abdomen on physical exam.  Psychiatric:        Behavior: Behavior is cooperative.     UC Treatments / Results  Labs (all labs ordered are listed, but only abnormal results are displayed) Labs Reviewed - No data to display  EKG   Radiology No results found.  Procedures Procedures (including critical care time)  Medications Ordered in UC Medications  alum & mag  hydroxide-simeth (MAALOX/MYLANTA) 200-200-20 MG/5ML suspension 30 mL (30 mLs Oral Given 12/25/20 1658)    And  lidocaine (XYLOCAINE) 2 % viscous mouth solution 15 mL (15 mLs Oral Given 12/25/20 1658)    Initial Impression / Assessment and Plan / UC Course  I have reviewed the triage vital signs and the nursing notes.  Pertinent labs & imaging results that were available during my care of the patient were reviewed by me and considered in my medical decision making (see chart for details).     Vital signs and physical exam reassuring today; no indication for emergent evaluation or imaging.  Patient was given GI cocktail in clinic without improvement of symptoms.  Recommended follow-up with GI specialist as soon as possible given ongoing pain.  Discussed that we are unable to get imaging and if she has any worsening symptoms including increased focal pain, blood in her stool, nausea/vomiting she needs to go to the emergency room to which she expressed understanding.  She has failed dicyclomine so we will try Levsin to manage symptoms.  Discussed the importance of drinking plenty of fluid.  She is eat a bland diet and avoid spicy/acidic/fatty foods.  Discussed alarm symptoms that warrant going to the ER.  Strict return precautions given to which she expressed understanding.  Final Clinical Impressions(s) / UC Diagnoses   Final diagnoses:  Generalized abdominal pain  Change in bowel habit  Chronic nausea     Discharge Instructions      Follow-up with  your GI specialist soon as possible given your ongoing symptoms.  I have called in Levsin to put under your tongue twice daily as needed.  This should help with cramping abdominal upset.  Make sure to eat a bland diet and avoid spicy/acidic foods.  Drink plenty of fluid.  We do not have imaging capabilities so if you have persistent or worsening abdominal pain or develop any other symptoms such as fever, vomiting, blood in your stool you need to go  to the emergency room as we discussed.     ED Prescriptions     Medication Sig Dispense Auth. Provider   Hyoscyamine Sulfate SL (LEVSIN/SL) 0.125 MG SUBL Place 0.125 mg under the tongue 2 (two) times daily as needed. 30 tablet Snow Peoples, Derry Skill, PA-C      PDMP not reviewed this encounter.   Terrilee Croak, PA-C 12/25/20 1726

## 2020-12-25 NOTE — ED Triage Notes (Signed)
Pt presents with some generalized abdominal pain with some diarrhea X 1 week.

## 2020-12-25 NOTE — Discharge Instructions (Signed)
Follow-up with your GI specialist soon as possible given your ongoing symptoms.  I have called in Levsin to put under your tongue twice daily as needed.  This should help with cramping abdominal upset.  Make sure to eat a bland diet and avoid spicy/acidic foods.  Drink plenty of fluid.  We do not have imaging capabilities so if you have persistent or worsening abdominal pain or develop any other symptoms such as fever, vomiting, blood in your stool you need to go to the emergency room as we discussed.

## 2020-12-26 ENCOUNTER — Other Ambulatory Visit: Payer: Self-pay

## 2020-12-26 DIAGNOSIS — J329 Chronic sinusitis, unspecified: Secondary | ICD-10-CM | POA: Insufficient documentation

## 2020-12-26 DIAGNOSIS — J3489 Other specified disorders of nose and nasal sinuses: Secondary | ICD-10-CM | POA: Insufficient documentation

## 2020-12-27 NOTE — Progress Notes (Signed)
Patient ID: Sherri [name], female    DOB: 02-May-1985  MRN: [id number]  CC: Prediabetes Follow-Up  Subjective: Sherri [name] is a 35 y.o. female who presents for prediabetes follow-up.   Her concerns today include:  PREDIABETES FOLLOW-UP: No concerns.  2. ANXIETY DEPRESSION: Doing well on current regimen, Amitriptyline. No issues/concerns. No thoughts of self-harm, suicidal ideations, or homicidal ideations. Reports anxiety depression mostly related to work-life balance. Has twin sons, 49 years-old, and she is working a job as well. Declines counseling services.   3. STOMACH PAIN FOLLOW-UP: 08/21/2020: - Ultrasound abdomen for further evaluation.   09/03/2020: Abdominal ultrasound overall normal and referred to Gastroenterology.   09/11/2020 at [facility] Gastroenterology per MD note: - Constipation. Generalized abdominal pain. Abdominal bloating. Increase Miralax to bid or tid to achieve a complete bowel movement daily. Offered colonoscopy or ACBE to further evaluate and she prefers ACBE. If ACBE is unremarkable no plans for further GI evaluation at this time.  - Borderline CBD dilation with normal LFTs and no typical biliary symptoms.  No further evaluation recommended at this time.  12/31/2020: Sides of stomach still hurting and feels sore. Still having nausea and Zofran helps but then nausea returns. Acid reflux medication helping. Thinks may be related to the foods she is eating.   4. BACK PAIN: Bilateral upper back hurting. Denies any recent injury, trauma, heavy lifting/pulling/pushing. Taking over-the-counter Tylenol and Ibuprofen helps some.  5. ALLERGIES: Doing well on Zyrtec, requesting refills.   6. NASAL CONGESTION: Began recently. Sinuses feel worse. Still taking nasal spray.  7. VAGINAL SYMPTOMS: Endorses fishy odor, burning outside of vagina and vaginal itching. Also, having burning with urination. Not currently sexually active. Thinks may have BV again.     Depression screen [facility] 2/9 12/31/2020 11/26/2020 08/21/2020 07/03/2020 05/17/2020  Decreased Interest 0 0 0 0 0  Down, Depressed, Hopeless 0 0 3 0 3  PHQ - 2 Score 0 0 3 0 3  Altered sleeping 0 - 0 0 3  Tired, decreased energy 0 - 3 3 0  Change in appetite 0 - 0 0 0  Feeling bad or failure about yourself  0 - 0 0 0  Trouble concentrating 0 - 0 0 0  Moving slowly or fidgety/restless 0 - 0 0 0  Suicidal thoughts 0 - 0 0 0  PHQ-9 Score 0 - 6 3 6   Difficult doing work/chores - - Very difficult - -  Some recent data might be hidden     Patient Active Problem List   Diagnosis Date Noted   Prediabetes 07/05/2020   Gastroesophageal reflux disease 04/26/2020   Perianal rash 03/26/2020   Chronic nausea 03/26/2020   Chronic constipation 03/09/2019   Pelvic pain in female 03/09/2019   Hypertension    Genital herpes    Uterine fibroid 12/25/2013   Obesity 12/25/2013   Bacterial vaginitis 04/27/2012     Current Outpatient Medications on File Prior to Visit  Medication Sig Dispense Refill   acetaminophen (TYLENOL) 500 MG tablet TAKE 2 TABLETS (1,000 MG TOTAL) BY MOUTH EVERY 6 (SIX) HOURS AS NEEDED FOR MODERATE PAIN, FEVER OR HEADACHE. 30 tablet 0   azelastine (ASTELIN) 0.1 % nasal spray Place 2 sprays into both nostrils 2 (two) times daily. 30 mL 0   Cholecalciferol (VITAMIN D3) 125 MCG (5000 UT) CAPS Take 1 capsule by mouth daily.     cyclobenzaprine (FLEXERIL) 10 MG tablet TAKE 1 TABLET (10 MG TOTAL) BY MOUTH AT BEDTIME. 20 tablet 0  Ferrous Sulfate (IRON) 28 MG TABS Take 1 tablet by mouth daily.     fluticasone (FLONASE) 50 MCG/ACT nasal spray Place 2 sprays into both nostrils daily. 16 g 0   Hyoscyamine Sulfate SL (LEVSIN/SL) 0.125 MG SUBL Place 0.125 mg under the tongue 2 (two) times daily as needed. 30 tablet 0   lubiprostone (AMITIZA) 24 MCG capsule Take 1 capsule (24 mcg total) by mouth 2 (two) times daily with a meal. 60 capsule 3   mirtazapine (REMERON) 15 MG tablet TAKE 2  TABLETS (30 MG TOTAL) BY MOUTH AT BEDTIME. 30 tablet 1   ondansetron (ZOFRAN-ODT) 4 MG disintegrating tablet Take 1 tablet (4 mg total) by mouth every 8 (eight) hours as needed for nausea or vomiting. 20 tablet 2   pantoprazole (PROTONIX) 40 MG tablet TAKE 1 TABLET (40 MG TOTAL) BY MOUTH DAILY. 30 tablet 5   pseudoephedrine (SUDAFED) 60 MG tablet Take 1 tablet (60 mg total) by mouth every 8 (eight) hours as needed for congestion. 30 tablet 0   tiZANidine (ZANAFLEX) 4 MG tablet Take 1 tablet (4 mg total) by mouth every 6 (six) hours as needed for muscle spasms. 20 tablet 0   No current facility-administered medications on file prior to visit.    Allergies  Allergen Reactions   Macrobid [Nitrofurantoin] Nausea And Vomiting    Social History   Socioeconomic History   Marital status: Single    Spouse name: Not on file   Number of children: Not on file   Years of education: Not on file   Highest education level: Not on file  Occupational History   Occupation: [employer]  Tobacco Use   Smoking status: Never   Smokeless tobacco: Never  Vaping Use   Vaping Use: Never used  Substance and Sexual Activity   Alcohol use: No   Drug use: No   Sexual activity: Yes    Birth control/protection: None  Other Topics Concern   Not on file  Social History Narrative   Not on file   Social Determinants of Health   Financial Resource Strain: Not on file  Food Insecurity: Not on file  Transportation Needs: Not on file  Physical Activity: Not on file  Stress: Not on file  Social Connections: Not on file  Intimate Partner Violence: Not on file    Family History  Problem Relation Age of Onset   Hypertension Mother    Hypertension Father    Breast cancer Maternal Grandmother        6   Colon cancer Neg Hx    Colon polyps Neg Hx    Kidney disease Neg Hx    Diabetes Neg Hx    Esophageal cancer Neg Hx    Gallbladder disease Neg Hx    Heart disease Neg Hx    Asthma Neg Hx    Stroke Neg  Hx    Stomach cancer Neg Hx     Past Surgical History:  Procedure Laterality Date   CESAREAN SECTION N/A 06/05/2014   Procedure: CESAREAN SECTION;  Surgeon: Truett Mainland, DO;  Location: [facility];  Service: Obstetrics;  Laterality: N/A;   WISDOM TOOTH EXTRACTION      ROS: Review of Systems Negative except as stated above  PHYSICAL EXAM: BP 137/84 (BP Location: Right Arm, Patient Position: Sitting, Cuff Size: Large)   Pulse 74   Resp 16   Wt 192 lb (87.1 kg)   LMP 12/16/2020   SpO2 98%   BMI 38.78 kg/m  Physical Exam HENT:     Head: Normocephalic and atraumatic.     Comments: Tenderness upon palpation of frontal sinuses.  Eyes:     Extraocular Movements: Extraocular movements intact.     Conjunctiva/sclera: Conjunctivae normal.     Pupils: Pupils are equal, round, and reactive to light.  Cardiovascular:     Rate and Rhythm: Normal rate and regular rhythm.     Pulses: Normal pulses.     Heart sounds: Normal heart sounds.  Pulmonary:     Effort: Pulmonary effort is normal.     Breath sounds: Normal breath sounds.  Abdominal:     General: Bowel sounds are normal.     Palpations: Abdomen is soft.     Comments: Epigastric and left hypochondrium region tenderness.   Musculoskeletal:     Cervical back: Normal range of motion and neck supple.     Comments: Bilateral upper back tenderness with palpation.  Neurological:     General: No focal deficit present.     Mental Status: She is alert and oriented to person, place, and time.  Psychiatric:        Mood and Affect: Mood normal.        Behavior: Behavior normal.   Results for orders placed or performed in visit on 12/31/20  POCT glycosylated hemoglobin (Hb A1C)  Result Value Ref Range   Hemoglobin A1C     HbA1c POC (<> result, manual entry)     HbA1c, POC (prediabetic range) 5.7 5.7 - 6.4 %   HbA1c, POC (controlled diabetic range)    POCT URINALYSIS DIP (CLINITEK)  Result Value Ref Range   Color, UA yellow yellow    Clarity, UA clear clear   Glucose, UA negative negative mg/dL   Bilirubin, UA negative negative   Ketones, POC UA negative negative mg/dL   Spec Grav, UA 1.020 1.010 - 1.025   Blood, UA trace-intact (A) negative   pH, UA 7.0 5.0 - 8.0   POC PROTEIN,UA negative negative, trace   Urobilinogen, UA 0.2 0.2 or 1.0 E.U./dL   Nitrite, UA Negative Negative   Leukocytes, UA Negative Negative    ASSESSMENT AND PLAN: 1. Prediabetes: - Hemoglobin A1c today 5.7%.  - Discussed the importance of healthy eating habits, low-carbohydrate diet, low-sugar diet, and regular aerobic exercise (at least 150 minutes a week as tolerated) to achieve or maintain control of prediabetes. - Follow-up with primary provider in 6 months or sooner if needed.  - POCT glycosylated hemoglobin (Hb A1C)  2. Anxiety and depression: - Patient denies thoughts of self-harm, suicidal ideations, homicidal ideations. - Continue Amitriptyline as prescribed.  - Patient declined counseling services.  - Follow-up with primary provider in 3 months or sooner if needed. - amitriptyline (ELAVIL) 25 MG tablet; Take 1 tablet (25 mg total) by mouth at bedtime.  Dispense: 30 tablet; Refill: 2  3. Generalized abdominal pain: - Referral to Gastroenterology for second opinion with further evaluation and management.  - Follow-up with primary provider as scheduled.  - Ambulatory referral to Gastroenterology  4. Acute bilateral thoracic back pain: - Ibuprofen as prescribed.  - Continue over-the-counter Tylenol.  - Follow-up with primary provider in 4 weeks or sooner if needed.  - ibuprofen (ADVIL) 600 MG tablet; Take 1 tablet (600 mg total) by mouth every 8 (eight) hours as needed.  Dispense: 30 tablet; Refill: 0  5. Perennial allergic rhinitis: - Continue Cetirizine as prescribed.  - Methylprednisolone sodium succinate today in office.  - Follow-up with  primary provider as scheduled.  - cetirizine (ZYRTEC ALLERGY) 10 MG tablet; Take 1  tablet (10 mg total) by mouth daily.  Dispense: 30 tablet; Refill: 11 - methylPREDNISolone sodium succinate (SOLU-MEDROL) 40 mg/mL injection 40 mg  6. Nasal congestion: - Screening for several respiratory illnesses.  - COVID-19, Flu A+B and RSV  7. Vaginal burning: 8. Vaginal odor: 9. Burning with urination: - Urinalysis negative for urinary tract infection.  - Cervicovaginal self-swab to screen for chlamydia, gonorrhea, trichomonas, bacterial vaginitis, and candida vaginitis. - Cervicovaginal ancillary only - POCT URINALYSIS DIP (CLINITEK)   10. Bacterial vaginitis: - Empiric treatment with Metronidazole vaginal gel.  - Follow-up with primary provider as scheduled.  - metroNIDAZOLE (METROGEL) 0.75 % vaginal gel; Place 1 Applicatorful vaginally at bedtime for 5 days.  Dispense: 70 g; Refill: 0     Patient was given the opportunity to ask questions.  Patient verbalized understanding of the plan and was able to repeat key elements of the plan. Patient was given clear instructions to go to Emergency Department or return to medical center if symptoms don't improve, worsen, or new problems develop.The patient verbalized understanding.   Orders Placed This Encounter  Procedures   COVID-19, Flu A+B and RSV   Ambulatory referral to Gastroenterology   POCT glycosylated hemoglobin (Hb A1C)   POCT URINALYSIS DIP (CLINITEK)     Requested Prescriptions   Signed Prescriptions Disp Refills   cetirizine (ZYRTEC ALLERGY) 10 MG tablet 30 tablet 11    Sig: Take 1 tablet (10 mg total) by mouth daily.   ibuprofen (ADVIL) 600 MG tablet 30 tablet 0    Sig: Take 1 tablet (600 mg total) by mouth every 8 (eight) hours as needed.   metroNIDAZOLE (METROGEL) 0.75 % vaginal gel 70 g 0    Sig: Place 1 Applicatorful vaginally at bedtime for 5 days.   amitriptyline (ELAVIL) 25 MG tablet 30 tablet 2    Sig: Take 1 tablet (25 mg total) by mouth at bedtime.    Return in about 3 months (around 04/02/2021)  for Follow-Up or next available anxiety/depression and prediabetes 6 months.  Camillia Herter, NP

## 2020-12-30 ENCOUNTER — Other Ambulatory Visit: Payer: Self-pay | Admitting: Internal Medicine

## 2020-12-30 ENCOUNTER — Other Ambulatory Visit: Payer: Self-pay

## 2020-12-31 ENCOUNTER — Other Ambulatory Visit: Payer: Self-pay

## 2020-12-31 ENCOUNTER — Encounter: Payer: Self-pay | Admitting: Family

## 2020-12-31 ENCOUNTER — Other Ambulatory Visit (HOSPITAL_COMMUNITY)
Admission: RE | Admit: 2020-12-31 | Discharge: 2020-12-31 | Disposition: A | Discharge status: Home / Self Care | Payer: Self-pay | Source: Ambulatory Visit | Attending: Family | Admitting: Family

## 2020-12-31 ENCOUNTER — Ambulatory Visit (INDEPENDENT_AMBULATORY_CARE_PROVIDER_SITE_OTHER): Payer: Self-pay | Admitting: Family

## 2020-12-31 VITALS — BP 137/84 | HR 74 | Resp 16 | Wt 192.0 lb

## 2020-12-31 DIAGNOSIS — B9689 Other specified bacterial agents as the cause of diseases classified elsewhere: Secondary | ICD-10-CM

## 2020-12-31 DIAGNOSIS — R300 Dysuria: Secondary | ICD-10-CM

## 2020-12-31 DIAGNOSIS — R1084 Generalized abdominal pain: Secondary | ICD-10-CM

## 2020-12-31 DIAGNOSIS — N898 Other specified noninflammatory disorders of vagina: Secondary | ICD-10-CM | POA: Insufficient documentation

## 2020-12-31 DIAGNOSIS — R0981 Nasal congestion: Secondary | ICD-10-CM

## 2020-12-31 DIAGNOSIS — N949 Unspecified condition associated with female genital organs and menstrual cycle: Secondary | ICD-10-CM | POA: Insufficient documentation

## 2020-12-31 DIAGNOSIS — N760 Acute vaginitis: Secondary | ICD-10-CM

## 2020-12-31 DIAGNOSIS — F32A Depression, unspecified: Secondary | ICD-10-CM

## 2020-12-31 DIAGNOSIS — R7303 Prediabetes: Secondary | ICD-10-CM

## 2020-12-31 DIAGNOSIS — J3089 Other allergic rhinitis: Secondary | ICD-10-CM

## 2020-12-31 DIAGNOSIS — F419 Anxiety disorder, unspecified: Secondary | ICD-10-CM

## 2020-12-31 DIAGNOSIS — M546 Pain in thoracic spine: Secondary | ICD-10-CM

## 2020-12-31 LAB — POCT URINALYSIS DIP (CLINITEK)
Bilirubin, UA: NEGATIVE
Glucose, UA: NEGATIVE mg/dL
Ketones, POC UA: NEGATIVE mg/dL
Leukocytes, UA: NEGATIVE
Nitrite, UA: NEGATIVE
POC PROTEIN,UA: NEGATIVE
Spec Grav, UA: 1.02 (ref 1.010–1.025)
Urobilinogen, UA: 0.2 E.U./dL
pH, UA: 7 (ref 5.0–8.0)

## 2020-12-31 LAB — POCT GLYCOSYLATED HEMOGLOBIN (HGB A1C): HbA1c, POC (prediabetic range): 5.7 % (ref 5.7–6.4)

## 2020-12-31 MED ORDER — CETIRIZINE HCL 10 MG PO TABS
10.0000 mg | ORAL_TABLET | Freq: Every day | ORAL | 11 refills | Status: DC
  Filled 2020-12-31: qty 30, 30d supply, fill #0
  Filled 2021-01-17 – 2021-01-23 (×4): qty 30, 30d supply, fill #1
  Filled 2021-02-25: qty 30, 30d supply, fill #2
  Filled 2021-03-27: qty 30, 30d supply, fill #3
  Filled 2021-03-27 – 2021-04-08 (×2): qty 30, 30d supply, fill #0
  Filled 2021-05-09: qty 30, 30d supply, fill #1
  Filled 2021-06-12: qty 30, 30d supply, fill #2
  Filled 2021-07-03: qty 30, 30d supply, fill #3
  Filled 2021-08-06 – 2021-08-16 (×2): qty 30, 30d supply, fill #4
  Filled 2021-08-22 – 2021-08-25 (×2): qty 30, 30d supply, fill #5
  Filled 2021-08-26: qty 100, 100d supply, fill #5

## 2020-12-31 MED ORDER — METRONIDAZOLE 0.75 % VA GEL
1.0000 | Freq: Every day | VAGINAL | 0 refills | Status: AC
  Filled 2020-12-31: qty 70, 5d supply, fill #0

## 2020-12-31 MED ORDER — METHYLPREDNISOLONE SODIUM SUCC 40 MG IJ SOLR
40.0000 mg | Freq: Once | INTRAMUSCULAR | Status: AC
  Administered 2020-12-31: 40 mg via INTRAMUSCULAR

## 2020-12-31 MED ORDER — AMITRIPTYLINE HCL 25 MG PO TABS
25.0000 mg | ORAL_TABLET | Freq: Every day | ORAL | 2 refills | Status: DC
  Filled 2020-12-31 (×4): qty 30, 30d supply, fill #0
  Filled 2021-01-17 – 2021-01-27 (×2): qty 30, 30d supply, fill #1
  Filled 2021-02-25: qty 30, 30d supply, fill #2

## 2020-12-31 MED ORDER — IBUPROFEN 600 MG PO TABS
600.0000 mg | ORAL_TABLET | Freq: Three times a day (TID) | ORAL | 0 refills | Status: DC | PRN
  Filled 2020-12-31: qty 30, 10d supply, fill #0

## 2020-12-31 NOTE — Progress Notes (Signed)
No urinary tract infection.   Prediabetes discussed in office.

## 2020-12-31 NOTE — Progress Notes (Signed)
Back pain, N/V, sinus concerns, abd. pain Vaginal burning and itching Medication RF: Elavil

## 2021-01-01 ENCOUNTER — Other Ambulatory Visit: Payer: Self-pay

## 2021-01-01 LAB — CERVICOVAGINAL ANCILLARY ONLY
Bacterial Vaginitis (gardnerella): POSITIVE — AB
Candida Glabrata: NEGATIVE
Candida Vaginitis: NEGATIVE
Chlamydia: NEGATIVE
Comment: NEGATIVE
Comment: NEGATIVE
Comment: NEGATIVE
Comment: NEGATIVE
Comment: NEGATIVE
Comment: NORMAL
Neisseria Gonorrhea: NEGATIVE
Trichomonas: NEGATIVE

## 2021-01-01 LAB — COVID-19, FLU A+B AND RSV
Influenza A, NAA: NOT DETECTED
Influenza B, NAA: NOT DETECTED
RSV, NAA: NOT DETECTED
SARS-CoV-2, NAA: NOT DETECTED

## 2021-01-01 NOTE — Progress Notes (Signed)
Gonorrhea, Chlamydia, Trichomonas, and Candida Vaginitis (sometimes called yeast infection) negative.   Positive for Bacterial Vaginitis. Will continue with Metronidazole treatment discussed yesterday in office.

## 2021-01-01 NOTE — Progress Notes (Signed)
Flu, Covid, and RSV negative.

## 2021-01-08 ENCOUNTER — Other Ambulatory Visit: Payer: Self-pay

## 2021-01-08 ENCOUNTER — Other Ambulatory Visit: Payer: Self-pay | Admitting: Family

## 2021-01-08 DIAGNOSIS — R110 Nausea: Secondary | ICD-10-CM

## 2021-01-08 DIAGNOSIS — B9689 Other specified bacterial agents as the cause of diseases classified elsewhere: Secondary | ICD-10-CM

## 2021-01-08 DIAGNOSIS — M546 Pain in thoracic spine: Secondary | ICD-10-CM

## 2021-01-09 ENCOUNTER — Other Ambulatory Visit: Payer: Self-pay

## 2021-01-12 NOTE — Progress Notes (Signed)
Virtual Visit via Telephone Note  I connected with Sherri [name], on 01/15/2021 at 4:09 PM by telephone due to the COVID-19 pandemic and verified that I am speaking with the correct person using two identifiers.  Due to current restrictions/limitations of in-office visits due to the COVID-19 pandemic, this scheduled clinical appointment was converted to a telehealth visit.   Consent: I discussed the limitations, risks, security and privacy concerns of performing an evaluation and management service by telephone and the availability of in person appointments. I also discussed with the patient that there may be a patient responsible charge related to this service. The patient expressed understanding and agreed to proceed.   Location of Patient: Home  Location of Provider: [facility] [place] participating in Telemedicine visit: [place], NP Elmon Else, [place]   History of Present Illness: Sherri [name] is a 35 year-old female who presents for back pain follow-up.  BACK PAIN FOLLOW-UP: 12/31/2020: - Ibuprofen as prescribed.  - Continue over-the-counter Tylenol.   01/15/2021: Back pain ongoing and worsening. Over-the-counter Tylenol and Ibuprofen not helping. No recent trauma/injury since previous appointment.   2. CHRONIC NAUSEA FOLLOW-UP: Requesting refills of Zofran. Endorses nausea no vomiting.   3. VAGINAL CONCERNS: Reports ongoing vaginal itching and burning since previous appointment.    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 30 tablet 0   amitriptyline (ELAVIL) 25 MG tablet Take 1 tablet (25 mg total) by mouth at bedtime. 30 tablet 2   azelastine (ASTELIN) 0.1 % nasal spray Place 2 sprays into both nostrils 2 (two) times daily. 30 mL 0   cetirizine (ZYRTEC ALLERGY) 10 MG tablet Take 1 tablet (10 mg total) by mouth daily. 30 tablet 11   Cholecalciferol (VITAMIN D3) 125 MCG (5000 UT) CAPS Take 1 capsule by mouth daily.     Ferrous Sulfate (IRON) 28 MG TABS Take 1 tablet by mouth daily.     fluticasone (FLONASE) 50 MCG/ACT nasal spray Place 2 sprays into both nostrils daily. 16 g 0   Hyoscyamine Sulfate SL (LEVSIN/SL) 0.125 MG SUBL Place 0.125 mg under the tongue 2 (two) times daily as needed. 30 tablet 0   ibuprofen (ADVIL) 600 MG tablet Take 1 tablet (600 mg total) by mouth every 8 (eight) hours as needed. 30 tablet 0   lubiprostone (AMITIZA) 24 MCG capsule Take 1 capsule (24 mcg total) by mouth 2 (two) times daily with a meal. 60 capsule 3   mirtazapine (REMERON) 15 MG tablet TAKE 2 TABLETS (30 MG TOTAL) BY MOUTH AT BEDTIME. 30 tablet 1   pantoprazole (PROTONIX) 40 MG tablet TAKE 1 TABLET (40 MG TOTAL) BY MOUTH DAILY. 30 tablet 5   pseudoephedrine (SUDAFED) 60 MG tablet Take 1 tablet (60 mg total) by mouth every 8 (eight) hours as needed for congestion. 30 tablet 0   tiZANidine (ZANAFLEX) 4 MG tablet Take 1 tablet (  4 mg total) by mouth every 6 (six) hours as needed for muscle spasms. 20 tablet 0   No current facility-administered medications on file prior to visit.    Observations/Objective: Alert and oriented x 3. Not in acute distress. Physical examination not completed as this is a telemedicine visit.  Assessment and Plan: 1. Acute bilateral thoracic back pain: - Cyclobenzaprine as prescribed. May cause drowsiness. Counseled patient to not consume if operating heavy machinery or  driving. Counseled patient to not consume with alcohol or illicit substances. Patient verbalized understanding.  Counseled on medication compliance and adverse effects.  - Diagnostic x-ray lumbar spine for further evaluation.  - Follow-up with primary provider as scheduled.  - cyclobenzaprine (FLEXERIL) 10 MG tablet; Take 1 tablet (10 mg total) by mouth at bedtime.  Dispense: 30 tablet; Refill: 0 - DG Lumbar Spine Complete; Future  2. Chronic nausea: - Continue Ondansetron as prescribed.  - Follow-up with primary provider as scheduled.  - ondansetron (ZOFRAN-ODT) 4 MG disintegrating tablet; Take 1 tablet (4 mg total) by mouth every 8 (eight) hours as needed for nausea or vomiting.  Dispense: 20 tablet; Refill: 2  3. Vaginal itching: 4. Vaginal burning: - Urinalysis to screen for possible urinary tract infection.  - Cervicovaginal self-swab to screen for chlamydia, gonorrhea, trichomonas, bacterial vaginitis, and candida vaginitis. - Patient plans to come to office within next 7 days to collect labs.  - Follow-up with primary provider as scheduled.  - POCT URINALYSIS DIP (CLINITEK); Future - Cervicovaginal ancillary only; Future   Follow Up Instructions: Follow-up with primary provider as scheduled.    Patient was given clear instructions to go to Emergency Department or return to medical center if symptoms don't improve, worsen, or new problems develop.The patient verbalized understanding.  I discussed the assessment and treatment plan with the patient. The patient was provided an opportunity to ask questions and all were answered. The patient agreed with the plan and demonstrated an understanding of the instructions.   The patient was advised to call back or seek an in-person evaluation if the symptoms worsen or if the condition fails to improve as anticipated.    I provided 17 minutes total of non-face-to-face time during this encounter.   Camillia Herter, NP  [facility]  [facility] at [place], [place] 01/15/2021, 4:20 PM

## 2021-01-15 ENCOUNTER — Other Ambulatory Visit: Payer: Self-pay

## 2021-01-15 ENCOUNTER — Telehealth (INDEPENDENT_AMBULATORY_CARE_PROVIDER_SITE_OTHER): Payer: Self-pay | Admitting: Family

## 2021-01-15 DIAGNOSIS — N898 Other specified noninflammatory disorders of vagina: Secondary | ICD-10-CM

## 2021-01-15 DIAGNOSIS — M546 Pain in thoracic spine: Secondary | ICD-10-CM

## 2021-01-15 DIAGNOSIS — N949 Unspecified condition associated with female genital organs and menstrual cycle: Secondary | ICD-10-CM

## 2021-01-15 DIAGNOSIS — R110 Nausea: Secondary | ICD-10-CM

## 2021-01-15 MED ORDER — CYCLOBENZAPRINE HCL 10 MG PO TABS
10.0000 mg | ORAL_TABLET | Freq: Every day | ORAL | 0 refills | Status: DC
  Filled 2021-01-15: qty 30, 30d supply, fill #0

## 2021-01-15 MED ORDER — ONDANSETRON 4 MG PO TBDP
4.0000 mg | ORAL_TABLET | Freq: Three times a day (TID) | ORAL | 2 refills | Status: DC | PRN
  Filled 2021-01-15: qty 20, 7d supply, fill #0
  Filled 2021-01-22: qty 20, 7d supply, fill #1
  Filled 2021-01-28: qty 20, 7d supply, fill #2

## 2021-01-15 NOTE — Progress Notes (Signed)
Pt presents for telemedicine visit for lower back pain, appt for this issue approx 2 weeks ago pt stated Ibuprofen and tylenol not working, questioned pt about previous Tizanidine usage, pt states that does not work either .

## 2021-01-16 ENCOUNTER — Other Ambulatory Visit: Payer: Self-pay

## 2021-01-17 ENCOUNTER — Other Ambulatory Visit: Payer: Self-pay

## 2021-01-17 ENCOUNTER — Other Ambulatory Visit (HOSPITAL_COMMUNITY)
Admission: RE | Admit: 2021-01-17 | Discharge: 2021-01-17 | Disposition: A | Discharge status: Home / Self Care | Payer: Self-pay | Source: Ambulatory Visit | Attending: Family | Admitting: Family

## 2021-01-17 ENCOUNTER — Ambulatory Visit (INDEPENDENT_AMBULATORY_CARE_PROVIDER_SITE_OTHER): Payer: Self-pay

## 2021-01-17 DIAGNOSIS — N898 Other specified noninflammatory disorders of vagina: Secondary | ICD-10-CM | POA: Insufficient documentation

## 2021-01-17 DIAGNOSIS — N949 Unspecified condition associated with female genital organs and menstrual cycle: Secondary | ICD-10-CM | POA: Insufficient documentation

## 2021-01-17 DIAGNOSIS — N9489 Other specified conditions associated with female genital organs and menstrual cycle: Secondary | ICD-10-CM

## 2021-01-17 LAB — POCT URINALYSIS DIP (CLINITEK)
Bilirubin, UA: NEGATIVE
Glucose, UA: NEGATIVE mg/dL
Ketones, POC UA: NEGATIVE mg/dL
Leukocytes, UA: NEGATIVE
Nitrite, UA: NEGATIVE
POC PROTEIN,UA: NEGATIVE
Spec Grav, UA: 1.02 (ref 1.010–1.025)
Urobilinogen, UA: 0.2 E.U./dL
pH, UA: 6 (ref 5.0–8.0)

## 2021-01-17 NOTE — Progress Notes (Signed)
No urinary tract infection.

## 2021-01-17 NOTE — Progress Notes (Signed)
Swab and urine sample obtained

## 2021-01-20 ENCOUNTER — Other Ambulatory Visit: Payer: Self-pay | Admitting: Family

## 2021-01-20 DIAGNOSIS — B9689 Other specified bacterial agents as the cause of diseases classified elsewhere: Secondary | ICD-10-CM

## 2021-01-20 LAB — CERVICOVAGINAL ANCILLARY ONLY
Bacterial Vaginitis (gardnerella): POSITIVE — AB
Candida Glabrata: NEGATIVE
Candida Vaginitis: NEGATIVE
Chlamydia: NEGATIVE
Comment: NEGATIVE
Comment: NEGATIVE
Comment: NEGATIVE
Comment: NEGATIVE
Comment: NEGATIVE
Comment: NORMAL
Neisseria Gonorrhea: NEGATIVE
Trichomonas: NEGATIVE

## 2021-01-20 MED ORDER — METRONIDAZOLE 0.75 % EX GEL
1.0000 "application " | Freq: Every day | CUTANEOUS | 0 refills | Status: AC
  Filled 2021-01-20: qty 45, 7d supply, fill #0

## 2021-01-20 NOTE — Progress Notes (Signed)
Gonorrhea, Chlamydia, Trichomonas, and Candida Vaginitis (sometimes called yeast infection negative).   Positive for Bacterial Vaginitis, an overgrowth of normal bacteria in the vagina due to changes in pH. Prescribed Metronidazole (Metrogel) 0.75% daily for 7 days. Do not drink alcohol while taking this medication as this may cause severe nausea, vomiting, and upset stomach.

## 2021-01-21 ENCOUNTER — Other Ambulatory Visit: Payer: Self-pay | Admitting: Family

## 2021-01-21 ENCOUNTER — Other Ambulatory Visit: Payer: Self-pay

## 2021-01-21 ENCOUNTER — Ambulatory Visit (INDEPENDENT_AMBULATORY_CARE_PROVIDER_SITE_OTHER): Payer: Self-pay | Admitting: Family

## 2021-01-21 ENCOUNTER — Encounter: Payer: Self-pay | Admitting: Family

## 2021-01-21 VITALS — BP 148/81 | HR 89 | Temp 98.3°F | Resp 18 | Ht 59.02 in | Wt 195.6 lb

## 2021-01-21 DIAGNOSIS — R030 Elevated blood-pressure reading, without diagnosis of hypertension: Secondary | ICD-10-CM

## 2021-01-21 DIAGNOSIS — B9689 Other specified bacterial agents as the cause of diseases classified elsewhere: Secondary | ICD-10-CM

## 2021-01-21 DIAGNOSIS — M797 Fibromyalgia: Secondary | ICD-10-CM

## 2021-01-21 DIAGNOSIS — E559 Vitamin D deficiency, unspecified: Secondary | ICD-10-CM

## 2021-01-21 DIAGNOSIS — N760 Acute vaginitis: Secondary | ICD-10-CM

## 2021-01-21 MED ORDER — METRONIDAZOLE 0.75 % VA GEL
1.0000 | Freq: Every day | VAGINAL | 0 refills | Status: DC
  Filled 2021-01-21: qty 70, 7d supply, fill #0

## 2021-01-21 MED ORDER — METHYLPREDNISOLONE SODIUM SUCC 40 MG IJ SOLR: 40.0000 mg | Freq: Once | INTRAMUSCULAR | Status: DC

## 2021-01-21 NOTE — Patient Instructions (Signed)
Muscle Pain, Adult ?Muscle pain, also called myalgia, is a condition in which a person has pain in one or more muscles in the body. Muscle pain may be mild, moderate, or severe. It may feel sharp, achy, or burning. In most cases, the pain lasts only a short time and goes away without treatment. ?Muscle pain can result from using muscles in a new or different way or after a period of inactivity. It is normal to feel some muscle pain after starting an exercise program. Muscles that have not been used often will be sore at first. ?What are the causes? ?This condition is caused by using muscles in a new or different way after a period of inactivity. Other causes may include: ?Overuse or muscle strain, especially if you are not in shape. This is the most common cause of muscle pain. ?Injury or bruising. ?Infectious diseases, including diseases caused by viruses, such as the flu (influenza). ?Fibromyalgia.This is a long-term, or chronic, condition that causes muscle tenderness, tiredness (fatigue), and headache. ?Autoimmune or rheumatologic diseases. These are conditions, such as lupus, in which the body's defense system (immunesystem) attacks areas in the body. ?Certain medicines, including ACE inhibitors and statins. ?What are the signs or symptoms? ?The main symptom of this condition is sore or painful muscles, including during activity and when stretching. You may also have slight swelling. ?How is this diagnosed? ?This condition is diagnosed with a physical exam. Your health care provider will ask questions about your pain and when it began. If you have not had muscle pain for very long, your health care provider may want to wait before doing much testing. If your muscle pain has lasted a long time, tests may be done right away. In some cases, this may include tests to rule out certain conditions or illnesses. ?How is this treated? ?Treatment for this condition depends on the cause. Home care is often enough to  relieve muscle pain. Your health care provider may also prescribe NSAIDs, such as ibuprofen. ?Follow these instructions at home: ?Medicines ?Take over-the-counter and prescription medicines only as told by your health care provider. ?Ask your health care provider if the medicine prescribed to you requires you to avoid driving or using machinery. ?Managing pain, swelling, and discomfort ?  ?If directed, put ice on the painful area. To do this: ?Put ice in a plastic bag. ?Place a towel between your skin and the bag. ?Leave the ice on for 20 minutes, 2-3 times a day. ?For the first 2 days of muscle soreness, or if there is swelling: ?Do not soak in hot baths. ?Do not use a hot tub, steam room, sauna, heating pad, or other heat source. ?After 48-72 hours, you may alternate between applying ice and applying heat as told by your health care provider. If directed, apply heat to the affected area as often as told by your health care provider. Use the heat source that your health care provider recommends, such as a moist heat pack or a heating pad. ?Place a towel between your skin and the heat source. ?Leave the heat on for 20-30 minutes. ?Remove the heat if your skin turns bright red. This is especially important if you are unable to feel pain, heat, or cold. You may have a greater risk of getting burned. ?If you have an injury, raise (elevate) the injured area above the level of your heart while you are sitting or lying down. ?Activity ? ?If overuse is causing your muscle pain: ?Slow down your activities   until the pain goes away. ?Do regular, gentle exercises if you are not usually active. ?Warm up before exercising. Stretch before and after exercising. This can help lower the risk of muscle pain. ?Do not continue working out if the pain is severe. Severe pain could mean that you have injured a muscle. ?Do not lift anything that is heavier than 5-10 lb (2.3-4.5 kg), or the limit that you are told, until your health care  provider says that it is safe. ?Return to your normal activities as told by your health care provider. Ask your health care provider what activities are safe for you. ?General instructions ?Do not use any products that contain nicotine or tobacco, such as cigarettes, e-cigarettes, and chewing tobacco. These can delay healing. If you need help quitting, ask your health care provider. ?Keep all follow-up visits as told by your health care provider. This is important. ?Contact a health care provider if you have: ?Muscle pain that gets worse and medicines do not help. ?Muscle pain that lasts longer than 3 days. ?A rash or fever along with muscle pain. ?Muscle pain after a tick bite. ?Muscle pain while working out, even though you are in good physical condition. ?Redness, soreness, or swelling along with muscle pain. ?Muscle pain after starting a new medicine or changing the dose of a medicine. ?Get help right away if you have: ?Trouble breathing. ?Trouble swallowing. ?Muscle pain along with a stiff neck, fever, and vomiting. ?Severe muscle weakness or you cannot move part of your body. ?These symptoms may represent a serious problem that is an emergency. Do not wait to see if the symptoms will go away. Get medical help right away. Call your local emergency services (911 in the [place] Do not drive yourself to the hospital. ?Summary ?Muscle pain usually lasts only a short time and goes away without treatment. ?This condition is caused by using muscles in a new or different way after a period of inactivity. ?If your muscle pain lasts longer than 3 days, tell your health care provider. ?This information is not intended to replace advice given to you by your health care provider. Make sure you discuss any questions you have with your health care provider. ?Document Revised: 09/06/2020 Document Reviewed: 09/06/2020 ?Elsevier Patient Education ? 2022 [employer]vier Inc[employer] ? ?

## 2021-01-21 NOTE — Progress Notes (Signed)
Patient ID: Sherri [name], female    DOB: 1985/08/31  MRN: [id number]  CC: Muscle Aches   Subjective: Sherri [name] is a 35 y.o. female who presents for muscle aches.   Her concerns today include:  Reports having muscle aches and back pain worsening since last appointment. Denies trauma/injury. Muscle relaxer not helping. Reports history of high blood pressure but never prescribed medication.  Patient Active Problem List   Diagnosis Date Noted   Prediabetes 07/05/2020   Gastroesophageal reflux disease 04/26/2020   Perianal rash 03/26/2020   Chronic nausea 03/26/2020   Chronic constipation 03/09/2019   Pelvic pain in female 03/09/2019   Hypertension    Genital herpes    Uterine fibroid 12/25/2013   Obesity 12/25/2013   Bacterial vaginitis 04/27/2012     Current Outpatient Medications on File Prior to Visit  Medication Sig Dispense Refill   acetaminophen (TYLENOL) 500 MG tablet TAKE 2 TABLETS (1,000 MG TOTAL) BY MOUTH EVERY 6 (SIX) HOURS AS NEEDED FOR MODERATE PAIN, FEVER OR HEADACHE. 30 tablet 0   amitriptyline (ELAVIL) 25 MG tablet Take 1 tablet (25 mg total) by mouth at bedtime. 30 tablet 2   azelastine (ASTELIN) 0.1 % nasal spray Place 2 sprays into both nostrils 2 (two) times daily. 30 mL 0   cetirizine (ZYRTEC ALLERGY) 10 MG tablet Take 1 tablet (10 mg total) by mouth daily. 30 tablet 11   Cholecalciferol (VITAMIN D3) 125 MCG (5000 UT) CAPS Take 1 capsule by mouth daily.     cyclobenzaprine (FLEXERIL) 10 MG tablet Take 1 tablet (10 mg total) by mouth at bedtime. 30 tablet 0   Ferrous Sulfate (IRON) 28 MG TABS Take 1 tablet by mouth daily.     fluticasone (FLONASE) 50 MCG/ACT nasal spray Place 2 sprays into both nostrils daily. 16 g 0   Hyoscyamine Sulfate SL (LEVSIN/SL) 0.125 MG SUBL Place 0.125 mg under the tongue 2 (two) times daily as needed. 30 tablet 0   ibuprofen (ADVIL) 600 MG tablet Take 1 tablet (600 mg total) by mouth every 8 (eight) hours as needed. 30 tablet  0   lubiprostone (AMITIZA) 24 MCG capsule Take 1 capsule (24 mcg total) by mouth 2 (two) times daily with a meal. 60 capsule 3   metroNIDAZOLE (METROGEL) 0.75 % gel Apply 1 application topically at bedtime for 7 days. 45 g 0   mirtazapine (REMERON) 15 MG tablet TAKE 2 TABLETS (30 MG TOTAL) BY MOUTH AT BEDTIME. 30 tablet 1   ondansetron (ZOFRAN-ODT) 4 MG disintegrating tablet Take 1 tablet (4 mg total) by mouth every 8 (eight) hours as needed for nausea or vomiting. 20 tablet 2   pantoprazole (PROTONIX) 40 MG tablet TAKE 1 TABLET (40 MG TOTAL) BY MOUTH DAILY. 30 tablet 5   pseudoephedrine (SUDAFED) 60 MG tablet Take 1 tablet (60 mg total) by mouth every 8 (eight) hours as needed for congestion. 30 tablet 0   tiZANidine (ZANAFLEX) 4 MG tablet Take 1 tablet (4 mg total) by mouth every 6 (six) hours as needed for muscle spasms. 20 tablet 0   No current facility-administered medications on file prior to visit.    Allergies  Allergen Reactions   Macrobid [Nitrofurantoin] Nausea And Vomiting    Social History   Socioeconomic History   Marital status: Single    Spouse name: Not on file   Number of children: Not on file   Years of education: Not on file   Highest education level: Not on file  Occupational History   Occupation: [employer]  Tobacco Use   Smoking status: Never   Smokeless tobacco: Never  Vaping Use   Vaping Use: Never used  Substance and Sexual Activity   Alcohol use: No   Drug use: No   Sexual activity: Yes    Birth control/protection: None  Other Topics Concern   Not on file  Social History Narrative   Not on file   Social Determinants of Health   Financial Resource Strain: Not on file  Food Insecurity: Not on file  Transportation Needs: Not on file  Physical Activity: Not on file  Stress: Not on file  Social Connections: Not on file  Intimate Partner Violence: Not on file    Family History  Problem Relation Age of Onset   Hypertension Mother     Hypertension Father    Breast cancer Maternal Grandmother        4   Colon cancer Neg Hx    Colon polyps Neg Hx    Kidney disease Neg Hx    Diabetes Neg Hx    Esophageal cancer Neg Hx    Gallbladder disease Neg Hx    Heart disease Neg Hx    Asthma Neg Hx    Stroke Neg Hx    Stomach cancer Neg Hx     Past Surgical History:  Procedure Laterality Date   CESAREAN SECTION N/A 06/05/2014   Procedure: CESAREAN SECTION;  Surgeon: Truett Mainland, DO;  Location: [facility];  Service: Obstetrics;  Laterality: N/A;   WISDOM TOOTH EXTRACTION      ROS: Review of Systems Negative except as stated above  PHYSICAL EXAM: BP (!) 148/81 (BP Location: Left Arm, Patient Position: Sitting, Cuff Size: Normal)   Pulse 89   Temp 98.3 F (36.8 C)   Resp 18   Ht 4' 11.02" (1.499 m)   Wt 195 lb 9.6 oz (88.7 kg)   SpO2 99%   BMI 39.48 kg/m   Physical Exam HENT:     Head: Normocephalic and atraumatic.  Eyes:     Extraocular Movements: Extraocular movements intact.     Conjunctiva/sclera: Conjunctivae normal.     Pupils: Pupils are equal, round, and reactive to light.  Cardiovascular:     Rate and Rhythm: Normal rate and regular rhythm.     Pulses: Normal pulses.     Heart sounds: Normal heart sounds.  Pulmonary:     Effort: Pulmonary effort is normal.     Breath sounds: Normal breath sounds.  Musculoskeletal:     Cervical back: Normal range of motion and neck supple.  Neurological:     General: No focal deficit present.     Mental Status: She is alert and oriented to person, place, and time.  Psychiatric:        Mood and Affect: Mood normal.        Behavior: Behavior normal.   ASSESSMENT AND PLAN: 1. Fibromyalgia: - History of fibromyalgia.  - CMP14+EGFR to check kidney function, liver function, and electrolyte balance.  - CBC to screen for anemia. - Methylprednisolone administered today in office.  - Referral to Rheumatology for further evaluation and management.  - CMP14+EGFR -  CBC - methylPREDNISolone sodium succinate (SOLU-MEDROL) 40 mg/mL injection 40 mg - Ambulatory referral to Rheumatology  2. Vitamin D deficiency: - Screening for level of deficiency.  - Vitamin D, 25-hydroxy  3. Elevated blood pressure reading: - Blood pressure not at goal during today's visit. Patient asymptomatic without chest pressure,  chest pain, palpitations, shortness of breath, worst headache of life, and any additional red flag symptoms. - Follow-up with primary provider in 2 weeks or sooner if needed.    Patient was given the opportunity to ask questions.  Patient verbalized understanding of the plan and was able to repeat key elements of the plan. Patient was given clear instructions to go to Emergency Department or return to medical center if symptoms don't improve, worsen, or new problems develop.The patient verbalized understanding.   Orders Placed This Encounter  Procedures   CMP14+EGFR   CBC   Vitamin D, 25-hydroxy   Ambulatory referral to Rheumatology     Requested Prescriptions    No prescriptions requested or ordered in this encounter    Return in about 2 weeks (around 02/04/2021) for Follow-Up or next available blood pressure check.  Camillia Herter, NP

## 2021-01-21 NOTE — Progress Notes (Signed)
Pt presents for muscle aches, symptoms started about 2 days ago no unusual lifting or straining per pt

## 2021-01-22 ENCOUNTER — Telehealth: Payer: Self-pay | Admitting: Family

## 2021-01-22 ENCOUNTER — Other Ambulatory Visit: Payer: Self-pay | Admitting: Family

## 2021-01-22 ENCOUNTER — Other Ambulatory Visit: Payer: Self-pay

## 2021-01-22 DIAGNOSIS — M546 Pain in thoracic spine: Secondary | ICD-10-CM

## 2021-01-22 LAB — CMP14+EGFR
ALT: 12 IU/L (ref 0–32)
AST: 12 IU/L (ref 0–40)
Albumin/Globulin Ratio: 1.7 (ref 1.2–2.2)
Albumin: 4.6 g/dL (ref 3.8–4.8)
Alkaline Phosphatase: 97 IU/L (ref 44–121)
BUN/Creatinine Ratio: 12 (ref 9–23)
BUN: 10 mg/dL (ref 6–20)
Bilirubin Total: <0.2 mg/dL (ref 0.0–1.2)
CO2: 22 mmol/L (ref 20–29)
Calcium: 9.7 mg/dL (ref 8.7–10.2)
Chloride: 101 mmol/L (ref 96–106)
Creatinine, Ser: 0.83 mg/dL (ref 0.57–1.00)
Globulin, Total: 2.7 g/dL (ref 1.5–4.5)
Glucose: 83 mg/dL (ref 70–99)
Potassium: 4.2 mmol/L (ref 3.5–5.2)
Sodium: 139 mmol/L (ref 134–144)
Total Protein: 7.3 g/dL (ref 6.0–8.5)
eGFR: 95 mL/min/{1.73_m2} (ref >59)

## 2021-01-22 LAB — CBC
Hematocrit: 40.3 % (ref 34.0–46.6)
Hemoglobin: 13.3 g/dL (ref 11.1–15.9)
MCH: 29 pg (ref 26.6–33.0)
MCHC: 33 g/dL (ref 31.5–35.7)
MCV: 88 fL (ref 79–97)
Platelets: 251 10*3/uL (ref 150–450)
RBC: 4.59 x10E6/uL (ref 3.77–5.28)
RDW: 14.4 % (ref 11.7–15.4)
WBC: 8 10*3/uL (ref 3.4–10.8)

## 2021-01-22 LAB — VITAMIN D 25 HYDROXY (VIT D DEFICIENCY, FRACTURES): Vit D, 25-Hydroxy: 33.1 ng/mL (ref 30.0–100.0)
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
## 2021-01-22 NOTE — Progress Notes (Signed)
Kidney function normal.   Liver function normal.  Vitamin D normal.   No anemia.

## 2021-01-22 NOTE — Telephone Encounter (Signed)
PT is asking why Flexeril was denied.Please advise and thank you.
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
## 2021-01-24 ENCOUNTER — Other Ambulatory Visit: Payer: Self-pay | Admitting: Family

## 2021-01-24 DIAGNOSIS — M546 Pain in thoracic spine: Secondary | ICD-10-CM

## 2021-01-24 MED ORDER — CYCLOBENZAPRINE HCL 10 MG PO TABS
10.0000 mg | ORAL_TABLET | Freq: Every day | ORAL | 0 refills | Status: AC
  Filled 2021-01-24: qty 30, 30d supply, fill #0

## 2021-01-24 NOTE — Telephone Encounter (Signed)
Cyclobenzaprine (Flexeril) refilled per patient request. Please schedule appointment for additional refills.

## 2021-01-27 ENCOUNTER — Other Ambulatory Visit: Payer: Self-pay

## 2021-01-27 MED FILL — Pantoprazole Sodium EC Tab 40 MG (Base Equiv): ORAL | 30 days supply | Qty: 30 | Fill #2 | Status: AC

## 2021-01-28 ENCOUNTER — Other Ambulatory Visit: Payer: Self-pay

## 2021-01-28 NOTE — Progress Notes (Signed)
Virtual Visit via Telephone Note  I connected with Sherri [name], on 01/29/2021 at 12:40 PM by telephone due to the COVID-19 pandemic and verified that I am speaking with the correct person using two identifiers.  Due to current restrictions/limitations of in-office visits due to the COVID-19 pandemic, this scheduled clinical appointment was converted to a telehealth visit.   Consent: I discussed the limitations, risks, security and privacy concerns of performing an evaluation and management service by telephone and the availability of in person appointments. I also discussed with the patient that there may be a patient responsible charge related to this service. The patient expressed understanding and agreed to proceed.   Location of Patient: Home  Location of Provider: [facility] [place] participating in Telemedicine visit: Slaughters, NP Elmon Else, [place]   History of Present Illness: Sherri [name] is a 35 year-old female who presents for continued vaginal itching and burning with urination. Completed previous antibiotic several days ago.  No recent sexual intercourse.     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 30 tablet 0   amitriptyline (ELAVIL) 25 MG tablet Take 1 tablet (25 mg total) by mouth at bedtime. 30 tablet 2   azelastine (ASTELIN) 0.1 % nasal spray Place 2 sprays into both nostrils 2 (two) times  daily. 30 mL 0   cetirizine (ZYRTEC ALLERGY) 10 MG tablet Take 1 tablet (10 mg total) by mouth daily. 30 tablet 11   Cholecalciferol (VITAMIN D3) 125 MCG (5000 UT) CAPS Take 1 capsule by mouth daily.     cyclobenzaprine (FLEXERIL) 10 MG tablet Take 1 tablet (10 mg total) by mouth at bedtime. 30 tablet 0   Ferrous Sulfate (IRON) 28 MG TABS Take 1 tablet by mouth daily.     fluticasone (FLONASE) 50 MCG/ACT nasal spray Place 2 sprays into both nostrils daily. 16 g 0   Hyoscyamine Sulfate SL (LEVSIN/SL) 0.125 MG SUBL Place 0.125 mg under the tongue 2 (two) times daily as needed. 30 tablet 0   ibuprofen (ADVIL) 600 MG tablet Take 1 tablet (600 mg total) by mouth every 8 (eight) hours as needed. 30 tablet 0   lubiprostone (AMITIZA) 24 MCG capsule Take 1 capsule (24 mcg total) by mouth 2 (two) times daily with a meal. 60 capsule 3   mirtazapine (REMERON) 15 MG tablet TAKE 2 TABLETS (30 MG TOTAL) BY MOUTH AT BEDTIME. 30 tablet 1   ondansetron (ZOFRAN-ODT) 4 MG disintegrating tablet Take 1 tablet (4 mg total) by mouth every 8 (eight) hours as needed for nausea or vomiting. 20 tablet 2   pantoprazole (PROTONIX) 40 MG tablet TAKE 1 TABLET (40 MG TOTAL) BY MOUTH DAILY. 30 tablet 5   pseudoephedrine (SUDAFED) 60 MG tablet Take 1 tablet (60 mg total) by mouth every 8 (eight) hours as needed for congestion. 30 tablet 0   tiZANidine (ZANAFLEX) 4 MG tablet  Take 1 tablet (4 mg total) by mouth every 6 (six) hours as needed for muscle spasms. 20 tablet 0   Current Facility-Administered Medications on File Prior to Visit  Medication Dose Route Frequency Provider Last Rate Last Admin   methylPREDNISolone sodium succinate (SOLU-MEDROL) 40 mg/mL injection 40 mg  40 mg Intramuscular Once Camillia Herter, NP        Observations/Objective: Alert and oriented x 3. Not in acute distress. Physical examination not completed as this is a telemedicine visit.  Assessment and Plan: 1. Bacterial vaginitis: - Recurrent  bacterial vaginitis.  - Metronidazole vaginal gel as prescribed. - Referral to Gynecology for further evaluation and management.  - Ambulatory referral to Gynecology - metroNIDAZOLE (METROGEL) 0.75 % vaginal gel; Place 1 Applicatorful vaginally 2 (two) times daily for 14 days.  Dispense: 280 g; Refill: 0   Follow Up Instructions: Follow-up with primary provider as scheduled.    Patient was given clear instructions to go to Emergency Department or return to medical center if symptoms don't improve, worsen, or new problems develop.The patient verbalized understanding.  I discussed the assessment and treatment plan with the patient. The patient was provided an opportunity to ask questions and all were answered. The patient agreed with the plan and demonstrated an understanding of the instructions.   The patient was advised to call back or seek an in-person evaluation if the symptoms worsen or if the condition fails to improve as anticipated.     I provided 12 minutes total of non-face-to-face time during this encounter.   Camillia Herter, NP  [facility] [place], [place] 01/29/2021, 12:52 PM

## 2021-01-29 ENCOUNTER — Other Ambulatory Visit: Payer: Self-pay

## 2021-01-29 ENCOUNTER — Ambulatory Visit (INDEPENDENT_AMBULATORY_CARE_PROVIDER_SITE_OTHER): Payer: Self-pay | Admitting: Family

## 2021-01-29 DIAGNOSIS — N760 Acute vaginitis: Secondary | ICD-10-CM

## 2021-01-29 DIAGNOSIS — B9689 Other specified bacterial agents as the cause of diseases classified elsewhere: Secondary | ICD-10-CM

## 2021-01-29 MED ORDER — METRONIDAZOLE 0.75 % VA GEL
1.0000 | Freq: Two times a day (BID) | VAGINAL | 0 refills | Status: AC
  Filled 2021-01-29 (×2): qty 140, 14d supply, fill #0
  Filled 2021-02-25: qty 140, 14d supply, fill #1

## 2021-01-29 NOTE — Progress Notes (Signed)
Pt presents for recurrent bv complains of vaginal itching and burning, denies any sexual intercourse, pt finished course of Flagyl approx 4-5 days ago

## 2021-02-03 ENCOUNTER — Encounter: Payer: Self-pay | Admitting: Gastroenterology

## 2021-02-03 ENCOUNTER — Ambulatory Visit (INDEPENDENT_AMBULATORY_CARE_PROVIDER_SITE_OTHER): Payer: Self-pay | Admitting: Gastroenterology

## 2021-02-03 ENCOUNTER — Other Ambulatory Visit: Payer: Self-pay

## 2021-02-03 VITALS — BP 130/92 | HR 92 | Ht 58.5 in | Wt 200.5 lb

## 2021-02-03 DIAGNOSIS — R198 Other specified symptoms and signs involving the digestive system and abdomen: Secondary | ICD-10-CM

## 2021-02-03 DIAGNOSIS — R1084 Generalized abdominal pain: Secondary | ICD-10-CM

## 2021-02-03 MED ORDER — GLYCOPYRROLATE 1 MG PO TABS
1.0000 mg | ORAL_TABLET | Freq: Two times a day (BID) | ORAL | 11 refills | Status: DC
  Filled 2021-02-03: qty 60, 30d supply, fill #0
  Filled 2021-02-25 – 2021-02-26 (×2): qty 60, 30d supply, fill #1
  Filled 2021-03-04: qty 60, 30d supply, fill #0

## 2021-02-03 NOTE — Progress Notes (Signed)
    History of Present Illness: This is a 35 year old female who was recently evaluated for constipation and lower abdominal pain.  MiraLAX and Amitiza were both tried.  She has now developed alternating diarrhea with constipation associated with nausea and generalized abdominal pain.  She notes her generalized abdominal pain is constant and does not change with meals or bowel movements.  She has fibromyalgia.  CT AP in 04/2020 and ACBE in 08/2020 were unremarkable. Abd [place] in 08/2020 was normal except for a borderline dilated common bile duct at 6-7 mm.  Several sets of LFTs were normal.  She complains of fatigue and multiple areas of her body with aches and pains.  Current Medications, Allergies, Past Medical History, Past Surgical History, Family History and Social History were reviewed in [employer] record.   Physical Exam: General: Well developed, well nourished, no acute distress Head: Normocephalic and atraumatic Eyes: Sclerae anicteric, EOMI Ears: Normal auditory acuity Mouth: Not examined, mask on during Covid-19 pandemic Lungs: Clear throughout to auscultation Heart: Regular rate and rhythm; no murmurs, rubs or bruits Abdomen: Soft, mild generalized tenderness and non distended. No masses, hepatosplenomegaly or hernias noted. Normal Bowel sounds Rectal: Not done Musculoskeletal: Symmetrical with no gross deformities  Pulses:  Normal pulses noted Extremities: No clubbing, cyanosis, edema or deformities noted Neurological: Alert oriented x 4, grossly nonfocal Psychological:  Alert and cooperative. Normal mood and affect   Assessment and Recommendations:  Constant generalized abdominal pain that does not change with meals or bowel movements.  Suspect this is musculoskeletal pain.  Possible IBS.  Trial of glycopyrrolate 1 mg p.o. twice daily and assess response. She is advised to contact us in 2-3 weeks to report on her symptoms.  Alternating diarrhea and  constipation.  She is advised to keep a food and beverage diary to help determine if she is experiencing food or beverage intolerances that contribute to her symptoms.  Trial of glycopyrrolate as above.

## 2021-02-03 NOTE — Patient Instructions (Signed)
We have sent the following medications to your pharmacy for you to pick up at your convenience: robinul.  The [facility] providers would like to encourage you to use [facility] to communicate with providers for non-urgent requests or questions.  Due to long hold times on the telephone, sending your provider a message by [facility] may be a faster and more efficient way to get a response.  Please allow 48 business hours for a response.  Please remember that this is for non-urgent requests.   Thank you for choosing me and [facility].  Pricilla Riffle. Dagoberto Ligas., MD., [facility]

## 2021-02-07 ENCOUNTER — Ambulatory Visit: Payer: Self-pay | Admitting: Family

## 2021-02-11 ENCOUNTER — Ambulatory Visit (HOSPITAL_COMMUNITY)
Admission: EM | Admit: 2021-02-11 | Discharge: 2021-02-11 | Disposition: A | Discharge status: Home / Self Care | Payer: Self-pay | Attending: Physician Assistant | Admitting: Physician Assistant

## 2021-02-11 ENCOUNTER — Other Ambulatory Visit: Payer: Self-pay

## 2021-02-11 ENCOUNTER — Encounter (HOSPITAL_COMMUNITY): Payer: Self-pay

## 2021-02-11 DIAGNOSIS — J069 Acute upper respiratory infection, unspecified: Secondary | ICD-10-CM | POA: Insufficient documentation

## 2021-02-11 DIAGNOSIS — R051 Acute cough: Secondary | ICD-10-CM | POA: Insufficient documentation

## 2021-02-11 DIAGNOSIS — Z20822 Contact with and (suspected) exposure to covid-19: Secondary | ICD-10-CM | POA: Insufficient documentation

## 2021-02-11 DIAGNOSIS — Z8616 Personal history of COVID-19: Secondary | ICD-10-CM | POA: Insufficient documentation

## 2021-02-11 MED ORDER — BENZONATATE 100 MG PO CAPS
100.0000 mg | ORAL_CAPSULE | Freq: Three times a day (TID) | ORAL | 0 refills | Status: DC
  Filled 2021-02-11: qty 21, 7d supply, fill #0

## 2021-02-11 MED ORDER — METHYLPREDNISOLONE ACETATE 80 MG/ML IJ SUSP
INTRAMUSCULAR | Status: AC
  Filled 2021-02-11: qty 1

## 2021-02-11 MED ORDER — METHYLPREDNISOLONE ACETATE 80 MG/ML IJ SUSP
60.0000 mg | Freq: Once | INTRAMUSCULAR | Status: AC
  Administered 2021-02-11: 60 mg via INTRAMUSCULAR

## 2021-02-11 NOTE — ED Triage Notes (Signed)
Pt presents with c/o coughing, sneezing and runny nose X 5 days. Pt reports headaches.

## 2021-02-11 NOTE — Discharge Instructions (Signed)
We did not test you for the flu since you have had symptoms for 5 days.  We did test you for COVID and we will contact you if this is positive.  We gave you a steroid injection today to help with your symptoms.  I have called in a cough medicine that you can use 3 times a day as needed for coughing.  Use Tylenol and ibuprofen for fever and pain.  Use Mucinex and Flonase for congestion.  Continue your allergy medication as prescribed.  Make sure you rest and drink plenty of fluid.  If you have any worsening symptoms including severe cough, fever, shortness of breath, chest pain, nausea, vomiting you need to be reevaluated.

## 2021-02-11 NOTE — ED Provider Notes (Signed)
[place]    CSN: [id number] Arrival date & time: 02/11/21  1745      History   Chief Complaint Chief Complaint  Patient presents with   Cough   sneezing    HPI Sherri [name] is a 36 y.o. female.   Patient presents today with a 5-day history of URI symptoms including coughing, sneezing, chest tightness, headache, nasal congestion.  Denies any shortness of breath, nausea, vomiting, fever.  Denies any known sick contacts but did recently start a new job.  She has tried over-the-counter medication including Mucinex, tea, honey without improvement of symptoms.  She has had COVID-19 vaccine including booster but has not had flu shot.  She has had COVID approximately a year ago.  Denies any recent antibiotic use.  She does have a history of allergies and is taking Zyrtec daily.  She does not smoke.  Denies history of asthma or COPD.  She is having difficulty with daily duties as result of symptoms.   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    Urinary tract infection    Uterine fibroid     Patient Active Problem List   Diagnosis Date Noted   Prediabetes 07/05/2020   Gastroesophageal reflux disease 04/26/2020   Perianal rash 03/26/2020   Chronic nausea 03/26/2020   Chronic constipation 03/09/2019   Pelvic pain in female 03/09/2019   Hypertension    Genital herpes    Uterine fibroid 12/25/2013   Obesity 12/25/2013   Bacterial vaginitis 04/27/2012    Past Surgical History:  Procedure Laterality Date   CESAREAN SECTION N/A 06/05/2014   Procedure: CESAREAN SECTION;  Surgeon: Truett Mainland, DO;  Location: [facility];  Service: Obstetrics;  Laterality: N/A;   WISDOM TOOTH EXTRACTION      OB History     Gravida  1   Para  1   Term      Preterm  1   AB      Living  2      SAB      IAB      Ectopic       Multiple  1   Live Births  2            Home Medications    Prior to Admission medications   Medication Sig Start Date End Date Taking? Authorizing Provider  benzonatate (TESSALON) 100 MG capsule Take 1 capsule (100 mg total) by mouth every 8 (eight) hours. 02/11/21  Yes Kayman Snuffer, Derry Skill, PA-C  acetaminophen (TYLENOL) 500 MG tablet TAKE 2 TABLETS (1,000 MG TOTAL) BY MOUTH EVERY 6 (SIX) HOURS AS NEEDED FOR MODERATE PAIN, FEVER OR HEADACHE. 04/30/20 04/30/21  Domenic Moras, PA-C  amitriptyline (ELAVIL) 25 MG tablet Take 1 tablet (25 mg total) by mouth at bedtime. 12/31/20 03/31/21  Camillia Herter, NP  azelastine (ASTELIN) 0.1 % nasal spray Place 2 sprays into both nostrils 2 (two) times daily. 02/10/20   Tasia Catchings, Amy V, PA-C  cetirizine (ZYRTEC ALLERGY) 10 MG tablet Take 1 tablet (10 mg total) by mouth daily. 12/31/20   Camillia Herter, NP  Cholecalciferol (VITAMIN D3) 125 MCG (5000 UT) CAPS Take 1 capsule by mouth daily.    [provider]  cyclobenzaprine (FLEXERIL) 10 MG tablet Take 1 tablet (10 mg total) by mouth at bedtime. 01/24/21 02/23/21  Minette Brine, Amy J, NP  Ferrous Sulfate (IRON) 28 MG TABS Take 1 tablet by mouth daily.    [provider]  fluticasone (FLONASE) 50 MCG/ACT nasal spray Place 2 sprays into both nostrils daily. 11/26/20   Camillia Herter, NP  glycopyrrolate (ROBINUL) 1 MG tablet Take 1 tablet (1 mg total) by mouth 2 (two) times daily. 02/03/21   Ladene Artist, MD  Hyoscyamine Sulfate SL (LEVSIN/SL) 0.125 MG SUBL Place 0.125 mg under the tongue 2 (two) times daily as needed. 12/25/20   Mallie Giambra, Derry Skill, PA-C  ibuprofen (ADVIL) 600 MG tablet Take 1 tablet (600 mg total) by mouth every 8 (eight) hours as needed. 12/31/20   Camillia Herter, NP  metroNIDAZOLE (METROGEL) 0.75 % vaginal gel Place 1 Applicatorful vaginally 2 (two) times daily for 14 days. 01/29/21 02/12/21  Camillia Herter, NP  mirtazapine (REMERON) 15 MG tablet TAKE 2 TABLETS (30 MG TOTAL) BY MOUTH AT  BEDTIME. 04/26/20 04/26/21  Zehr, Janett Billow D, PA-C  ondansetron (ZOFRAN-ODT) 4 MG disintegrating tablet Take 1 tablet (4 mg total) by mouth every 8 (eight) hours as needed for nausea or vomiting. 01/15/21   Camillia Herter, NP  pantoprazole (PROTONIX) 40 MG tablet TAKE 1 TABLET (40 MG TOTAL) BY MOUTH DAILY. 04/26/20 04/26/21  Zehr, Laban Emperor, PA-C  pseudoephedrine (SUDAFED) 60 MG tablet Take 1 tablet (60 mg total) by mouth every 8 (eight) hours as needed for congestion. 11/29/20   Jaynee Eagles, PA-C  tiZANidine (ZANAFLEX) 4 MG tablet Take 1 tablet (4 mg total) by mouth every 6 (six) hours as needed for muscle spasms. 10/28/20   Pearson Forster, NP    Family History Family History  Problem Relation Age of Onset   Hypertension Mother    Hypertension Father    Breast cancer Maternal Grandmother        53   Colon cancer Neg Hx    Colon polyps Neg Hx    Kidney disease Neg Hx    Diabetes Neg Hx    Esophageal cancer Neg Hx    Gallbladder disease Neg Hx    Heart disease Neg Hx    Asthma Neg Hx    Stroke Neg Hx    Stomach cancer Neg Hx     Social History Social History   Tobacco Use   Smoking status: Never   Smokeless tobacco: Never  Vaping Use   Vaping Use: Never used  Substance Use Topics   Alcohol use: No   Drug use: No     Allergies   Macrobid [nitrofurantoin]   Review of Systems Review of Systems  Constitutional:  Positive for activity change and fatigue. Negative for appetite change and fever.  HENT:  Positive for congestion, postnasal drip and sinus pressure. Negative for sneezing and sore throat.   Respiratory:  Positive for cough and chest tightness. Negative for shortness of breath and wheezing.   Cardiovascular:  Negative for chest pain.  Gastrointestinal:  Negative for abdominal pain, diarrhea, nausea and vomiting.  Musculoskeletal:  Negative for arthralgias and myalgias.  Neurological:  Positive for headaches. Negative for dizziness and light-headedness.    Physical  Exam Triage Vital Signs ED Triage Vitals  Enc Vitals Group     BP 02/11/21 1845 (!) 158/94     Pulse Rate 02/11/21 1844 86     Resp 02/11/21 1844 17     Temp 02/11/21 1844 98.9 F (37.2 C)     Temp Source 02/11/21 1844 Oral  SpO2 02/11/21 1844 98 %     Weight --      Height --      Head Circumference --      Peak Flow --      Pain Score 02/11/21 1844 0     Pain Loc --      Pain Edu? --      Excl. in [place]? --    No data found.  Updated Vital Signs BP (!) 158/94    Pulse 86    Temp 98.9 F (37.2 C) (Oral)    Resp 17    LMP 01/30/2021    SpO2 98%   Visual Acuity Right Eye Distance:   Left Eye Distance:   Bilateral Distance:    Right Eye Near:   Left Eye Near:    Bilateral Near:     Physical Exam Vitals reviewed.  Constitutional:      General: She is awake. She is not in acute distress.    Appearance: Normal appearance. She is well-developed. She is not ill-appearing.     Comments: Very pleasant female appears stated age in no acute distress sitting comfortably in exam room  HENT:     Head: Normocephalic and atraumatic.     Right Ear: Tympanic membrane, ear canal and external ear normal. Tympanic membrane is not erythematous or bulging.     Left Ear: Tympanic membrane, ear canal and external ear normal. Tympanic membrane is not erythematous or bulging.     Nose:     Right Sinus: Maxillary sinus tenderness present. No frontal sinus tenderness.     Left Sinus: Maxillary sinus tenderness present. No frontal sinus tenderness.     Mouth/Throat:     Pharynx: Uvula midline. Posterior oropharyngeal erythema present. No oropharyngeal exudate.  Cardiovascular:     Rate and Rhythm: Normal rate and regular rhythm.     Heart sounds: Normal heart sounds, S1 normal and S2 normal. No murmur heard. Pulmonary:     Effort: Pulmonary effort is normal.     Breath sounds: Normal breath sounds. No wheezing, rhonchi or rales.     Comments: Clear to auscultation bilaterally Psychiatric:         Behavior: Behavior is cooperative.     UC Treatments / Results  Labs (all labs ordered are listed, but only abnormal results are displayed) Labs Reviewed  SARS CORONAVIRUS 2 (TAT 6-24 HRS)    EKG   Radiology No results found.  Procedures Procedures (including critical care time)  Medications Ordered in UC Medications  methylPREDNISolone acetate (DEPO-MEDROL) injection 60 mg (has no administration in time range)    Initial Impression / Assessment and Plan / UC Course  I have reviewed the triage vital signs and the nursing notes.  Pertinent labs & imaging results that were available during my care of the patient were reviewed by me and considered in my medical decision making (see chart for details).     Discussed likely viral etiology given short duration of symptoms.  No evidence of acute infection that would warrant initiation of antibiotics today.  Given patient has been symptomatic for more than 3 days and is outside the window of effectiveness for Tamiflu, flu testing was deferred.  Patient did request COVID testing for her employer and this was sent out.  She was provided work excuse note with current CDC return to work guidelines based on test result.  She was given steroid injection after we discussed treatment options and she reported she did not  have the money for medication and preferred to be given something here rather than having to pick it up from the pharmacy.  60 mg of Depo-Medrol given in clinic today.  I did send in Tessalon to have on hand for cough symptoms given they are severe and interfering with daily activities.  She can continue using over-the-counter medications including Tylenol, Mucinex, Flonase for additional symptom relief.  Recommend she rest and drink plenty of fluid.  Discussed alarm symptoms that warrant emergent evaluation.  Strict return precautions given to which she expressed understanding.  Final Clinical Impressions(s) / UC Diagnoses    Final diagnoses:  Viral URI with cough  Acute cough     Discharge Instructions      We did not test you for the flu since you have had symptoms for 5 days.  We did test you for COVID and we will contact you if this is positive.  We gave you a steroid injection today to help with your symptoms.  I have called in a cough medicine that you can use 3 times a day as needed for coughing.  Use Tylenol and ibuprofen for fever and pain.  Use Mucinex and Flonase for congestion.  Continue your allergy medication as prescribed.  Make sure you rest and drink plenty of fluid.  If you have any worsening symptoms including severe cough, fever, shortness of breath, chest pain, nausea, vomiting you need to be reevaluated.    ED Prescriptions     Medication Sig Dispense Auth. Provider   benzonatate (TESSALON) 100 MG capsule Take 1 capsule (100 mg total) by mouth every 8 (eight) hours. 21 capsule Tanasha Menees K, PA-C      PDMP not reviewed this encounter.   Terrilee Croak, PA-C 02/11/21 1909

## 2021-02-12 ENCOUNTER — Other Ambulatory Visit: Payer: Self-pay

## 2021-02-12 LAB — SARS CORONAVIRUS 2 (TAT 6-24 HRS): SARS Coronavirus 2: NEGATIVE

## 2021-02-15 ENCOUNTER — Ambulatory Visit (HOSPITAL_COMMUNITY)
Admission: EM | Admit: 2021-02-15 | Discharge: 2021-02-15 | Disposition: A | Discharge status: Home / Self Care | Payer: Self-pay | Attending: Physician Assistant | Admitting: Physician Assistant

## 2021-02-15 ENCOUNTER — Encounter (HOSPITAL_COMMUNITY): Payer: Self-pay | Admitting: Emergency Medicine

## 2021-02-15 ENCOUNTER — Other Ambulatory Visit: Payer: Self-pay

## 2021-02-15 DIAGNOSIS — J0140 Acute pansinusitis, unspecified: Secondary | ICD-10-CM

## 2021-02-15 MED ORDER — CEFDINIR 300 MG PO CAPS
300.0000 mg | ORAL_CAPSULE | Freq: Two times a day (BID) | ORAL | 0 refills | Status: DC
  Filled 2021-02-17: qty 20, 10d supply, fill #0

## 2021-02-15 MED ORDER — AMOXICILLIN-POT CLAVULANATE 875-125 MG PO TABS: 1.0000 | ORAL_TABLET | Freq: Two times a day (BID) | ORAL | 0 refills | Status: DC

## 2021-02-15 NOTE — ED Triage Notes (Signed)
Reports not feeling well for one week.  No appetite, general aches.  Patient reports she has had diarrhea.  Has nausea, headache.

## 2021-02-15 NOTE — ED Provider Notes (Addendum)
[name]    CSN: [id number] Arrival date & time: 02/15/21  1116      History   Chief Complaint Chief Complaint  Patient presents with   Generalized Body Aches    HPI Sherri [name] is a 35 y.o. female.   Patient presents today with a week plus long history of URI symptoms.  Reports nasal congestion, sinus pressure, drainage, scratchy throat, headache, nausea, diarrhea, fatigue, malaise.  Denies any chest pain, shortness of breath, vomiting, syncope.  She was seen on 02/11/2021 which point she tested negative for COVID and was given Solu-Medrol injection as well as Tessalon Perles.  Denies any improvement with this medication.  Denies any additional sick contacts.  She denies history of asthma, smoking, COPD.  Does have allergies and takes Zyrtec daily.  She has had COVID-19 vaccine including booster but has not had flu shot.  She had COVID approximately 1 year ago.  She denies any recent antibiotic use.  She is having difficulty with daily duties as a result of symptoms.   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    Urinary tract infection    Uterine fibroid     Patient Active Problem List   Diagnosis Date Noted   Prediabetes 07/05/2020   Gastroesophageal reflux disease 04/26/2020   Perianal rash 03/26/2020   Chronic nausea 03/26/2020   Chronic constipation 03/09/2019   Pelvic pain in female 03/09/2019   Hypertension    Genital herpes    Uterine fibroid 12/25/2013   Obesity 12/25/2013   Bacterial vaginitis 04/27/2012    Past Surgical History:  Procedure Laterality Date   CESAREAN SECTION N/A 06/05/2014   Procedure: CESAREAN SECTION;  Surgeon: Truett Mainland, DO;  Location: [facility];  Service: Obstetrics;  Laterality: N/A;   WISDOM TOOTH EXTRACTION      OB History     Gravida  1   Para  1   Term       Preterm  1   AB      Living  2      SAB      IAB      Ectopic      Multiple  1   Live Births  2            Home Medications    Prior to Admission medications   Medication Sig Start Date End Date Taking? Authorizing Provider  cefdinir (OMNICEF) 300 MG capsule Take 1 capsule (300 mg total) by mouth 2 (two) times daily. 02/15/21  Yes Mystery Schrupp, Derry Skill, PA-C  acetaminophen (TYLENOL) 500 MG tablet TAKE 2 TABLETS (1,000 MG TOTAL) BY MOUTH EVERY 6 (SIX) HOURS AS NEEDED FOR MODERATE PAIN, FEVER OR HEADACHE. 04/30/20 04/30/21  Domenic Moras, PA-C  amitriptyline (ELAVIL) 25 MG tablet Take 1 tablet (25 mg total) by mouth at bedtime. 12/31/20 03/31/21  Camillia Herter, NP  azelastine (ASTELIN) 0.1 % nasal spray Place 2 sprays into both nostrils 2 (two) times daily. 02/10/20   Tasia Catchings, Amy V, PA-C  benzonatate (TESSALON) 100 MG capsule Take 1 capsule (100 mg total) by mouth every 8 (eight) hours. 02/11/21   Brena Windsor, Derry Skill, PA-C  cetirizine (ZYRTEC ALLERGY) 10 MG tablet Take 1 tablet (10 mg total) by mouth daily. 12/31/20   Camillia Herter, NP  Cholecalciferol (VITAMIN D3) 125  MCG (5000 UT) CAPS Take 1 capsule by mouth daily.    [provider]  cyclobenzaprine (FLEXERIL) 10 MG tablet Take 1 tablet (10 mg total) by mouth at bedtime. 01/24/21 02/23/21  Camillia Herter, NP  Ferrous Sulfate (IRON) 28 MG TABS Take 1 tablet by mouth daily.    [provider]  fluticasone (FLONASE) 50 MCG/ACT nasal spray Place 2 sprays into both nostrils daily. 11/26/20   Camillia Herter, NP  glycopyrrolate (ROBINUL) 1 MG tablet Take 1 tablet (1 mg total) by mouth 2 (two) times daily. 02/03/21   Ladene Artist, MD  Hyoscyamine Sulfate SL (LEVSIN/SL) 0.125 MG SUBL Place 0.125 mg under the tongue 2 (two) times daily as needed. 12/25/20   Allysa Governale, Derry Skill, PA-C  ibuprofen (ADVIL) 600 MG tablet Take 1 tablet (600 mg total) by mouth every 8 (eight) hours as needed. 12/31/20   Camillia Herter, NP  mirtazapine (REMERON)  15 MG tablet TAKE 2 TABLETS (30 MG TOTAL) BY MOUTH AT BEDTIME. 04/26/20 04/26/21  Zehr, Janett Billow D, PA-C  ondansetron (ZOFRAN-ODT) 4 MG disintegrating tablet Take 1 tablet (4 mg total) by mouth every 8 (eight) hours as needed for nausea or vomiting. 01/15/21   Camillia Herter, NP  pantoprazole (PROTONIX) 40 MG tablet TAKE 1 TABLET (40 MG TOTAL) BY MOUTH DAILY. 04/26/20 04/26/21  Zehr, Laban Emperor, PA-C  pseudoephedrine (SUDAFED) 60 MG tablet Take 1 tablet (60 mg total) by mouth every 8 (eight) hours as needed for congestion. 11/29/20   Jaynee Eagles, PA-C  tiZANidine (ZANAFLEX) 4 MG tablet Take 1 tablet (4 mg total) by mouth every 6 (six) hours as needed for muscle spasms. 10/28/20   Pearson Forster, NP    Family History Family History  Problem Relation Age of Onset   Hypertension Mother    Hypertension Father    Breast cancer Maternal Grandmother        76   Colon cancer Neg Hx    Colon polyps Neg Hx    Kidney disease Neg Hx    Diabetes Neg Hx    Esophageal cancer Neg Hx    Gallbladder disease Neg Hx    Heart disease Neg Hx    Asthma Neg Hx    Stroke Neg Hx    Stomach cancer Neg Hx     Social History Social History   Tobacco Use   Smoking status: Never   Smokeless tobacco: Never  Vaping Use   Vaping Use: Never used  Substance Use Topics   Alcohol use: No   Drug use: No     Allergies   Macrobid [nitrofurantoin]   Review of Systems Review of Systems  Constitutional:  Positive for activity change and fatigue. Negative for appetite change and fever.  HENT:  Positive for congestion, postnasal drip, sinus pressure and sore throat. Negative for sneezing.   Respiratory:  Positive for cough. Negative for shortness of breath.   Cardiovascular:  Negative for chest pain.  Gastrointestinal:  Positive for diarrhea and nausea. Negative for abdominal pain and vomiting.  Musculoskeletal:  Positive for arthralgias and myalgias.  Neurological:  Positive for headaches. Negative for dizziness  and light-headedness.    Physical Exam Triage Vital Signs ED Triage Vitals  Enc Vitals Group     BP 02/15/21 1220 (!) 144/76     Pulse Rate 02/15/21 1220 98     Resp 02/15/21 1220 20     Temp 02/15/21 1220 98.7 F (37.1 C)  Temp Source 02/15/21 1220 Oral     SpO2 02/15/21 1220 100 %     Weight --      Height --      Head Circumference --      Peak Flow --      Pain Score 02/15/21 1217 8     Pain Loc --      Pain Edu? --      Excl. in [place]? --    No data found.  Updated Vital Signs BP (!) 144/76 (BP Location: Right Arm) Comment (BP Location): large cuff   Pulse 98    Temp 98.7 F (37.1 C) (Oral)    Resp 20    LMP 01/30/2021    SpO2 100%   Visual Acuity Right Eye Distance:   Left Eye Distance:   Bilateral Distance:    Right Eye Near:   Left Eye Near:    Bilateral Near:     Physical Exam Vitals reviewed.  Constitutional:      General: She is awake. She is not in acute distress.    Appearance: Normal appearance. She is well-developed. She is not ill-appearing.     Comments: Very pleasant female appears stated age in no acute distress sitting comfortably in exam room  HENT:     Head: Normocephalic and atraumatic.     Right Ear: Tympanic membrane, ear canal and external ear normal. Tympanic membrane is not erythematous or bulging.     Left Ear: Ear canal and external ear normal. Tympanic membrane is injected. Tympanic membrane is not erythematous or bulging.     Nose:     Right Sinus: Maxillary sinus tenderness and frontal sinus tenderness present.     Left Sinus: Maxillary sinus tenderness and frontal sinus tenderness present.     Mouth/Throat:     Pharynx: Uvula midline. Posterior oropharyngeal erythema present. No oropharyngeal exudate.     Comments: Erythema and drainage in posterior oropharynx Cardiovascular:     Rate and Rhythm: Normal rate and regular rhythm.     Heart sounds: Normal heart sounds, S1 normal and S2 normal. No murmur heard. Pulmonary:      Effort: Pulmonary effort is normal.     Breath sounds: Normal breath sounds. No wheezing, rhonchi or rales.     Comments: Clear to auscultation bilaterally Psychiatric:        Behavior: Behavior is cooperative.     UC Treatments / Results  Labs (all labs ordered are listed, but only abnormal results are displayed) Labs Reviewed - No data to display  EKG   Radiology No results found.  Procedures Procedures (including critical care time)  Medications Ordered in UC Medications - No data to display  Initial Impression / Assessment and Plan / UC Course  I have reviewed the triage vital signs and the nursing notes.  Pertinent labs & imaging results that were available during my care of the patient were reviewed by me and considered in my medical decision making (see chart for details).     No indication for viral testing given patient has already had negative COVID test and has been symptomatic for more than a week.  Given prolonged and worsening symptoms will cover with antibiotic for sinusitis.  She was prescribed Augmentin twice daily for 7 days.  Recommended she use over-the-counter medications including Mucinex and Flonase for additional symptom relief.  She is to rest and drink plenty of fluid.  Discussed alarm symptoms that warrant emergent evaluation including high fever, severe  cough, shortness of breath, chest pain, nausea/vomiting interfering with oral intake, generalized weakness.  Strict return precautions given to which she expressed understanding.  At the end of the visit patient reported that Augmentin causes her to have significant GI symptoms.  She request this be changed to something else so cefdinir was sent to the pharmacy. Augmentin was cancelled with pharmacy.   Final Clinical Impressions(s) / UC Diagnoses   Final diagnoses:  Acute non-recurrent pansinusitis     Discharge Instructions      Start Augmentin twice daily to cover for infection.  Use Mucinex,  Flonase, Tylenol for additional symptom relief.  You can continue Tessalon as previously prescribed.  Make sure you rest and drink plenty of fluid.  If your symptoms or not improving within 3 days please see your primary care provider or our clinic.  If you have any worsening symptoms including fever, worsening cough, shortness of breath, chest pain, vomiting, weakness need to go to the emergency room.     ED Prescriptions     Medication Sig Dispense Auth. Provider   amoxicillin-clavulanate (AUGMENTIN) 875-125 MG tablet  (Status: Discontinued) Take 1 tablet by mouth every 12 (twelve) hours. 14 tablet Dania Marsan K, PA-C   cefdinir (OMNICEF) 300 MG capsule Take 1 capsule (300 mg total) by mouth 2 (two) times daily. 20 capsule Marchele Decock, Derry Skill, PA-C      PDMP not reviewed this encounter.   Terrilee Croak, PA-C 02/15/21 1246    Shanika Levings, Derry Skill, PA-C 02/15/21 1306

## 2021-02-15 NOTE — Discharge Instructions (Signed)
Start Augmentin twice daily to cover for infection.  Use Mucinex, Flonase, Tylenol for additional symptom relief.  You can continue Tessalon as previously prescribed.  Make sure you rest and drink plenty of fluid.  If your symptoms or not improving within 3 days please see your primary care provider or our clinic.  If you have any worsening symptoms including fever, worsening cough, shortness of breath, chest pain, vomiting, weakness need to go to the emergency room.

## 2021-02-17 ENCOUNTER — Other Ambulatory Visit: Payer: Self-pay | Admitting: Family

## 2021-02-17 ENCOUNTER — Other Ambulatory Visit: Payer: Self-pay

## 2021-02-17 ENCOUNTER — Telehealth: Payer: Self-pay | Admitting: Family

## 2021-02-17 DIAGNOSIS — R110 Nausea: Secondary | ICD-10-CM

## 2021-02-17 MED ORDER — ONDANSETRON 4 MG PO TBDP
4.0000 mg | ORAL_TABLET | Freq: Three times a day (TID) | ORAL | 2 refills | Status: DC | PRN
  Filled 2021-02-17: qty 20, 7d supply, fill #0
  Filled 2021-02-25: qty 20, 7d supply, fill #1

## 2021-02-17 NOTE — Telephone Encounter (Signed)
Ondansetron (Zofran-ODT) refilled per patient request. Schedule appointment for additional refills.

## 2021-02-17 NOTE — Telephone Encounter (Signed)
°  Rx #: [id number]  ondansetron (ZOFRAN-ODT) 4 MG disintegrating tablet [[id number]]   [facility] [facility] [address], [place] [address]  Phone:  [phone]  Fax:  [phone]  DEA #:  [id number]

## 2021-02-18 ENCOUNTER — Other Ambulatory Visit: Payer: Self-pay

## 2021-02-19 ENCOUNTER — Other Ambulatory Visit: Payer: Self-pay

## 2021-02-21 ENCOUNTER — Other Ambulatory Visit: Payer: Self-pay

## 2021-02-21 ENCOUNTER — Other Ambulatory Visit: Payer: Self-pay | Admitting: Family

## 2021-02-21 DIAGNOSIS — B9689 Other specified bacterial agents as the cause of diseases classified elsewhere: Secondary | ICD-10-CM

## 2021-02-25 ENCOUNTER — Other Ambulatory Visit: Payer: Self-pay

## 2021-02-25 MED FILL — Pantoprazole Sodium EC Tab 40 MG (Base Equiv): ORAL | 30 days supply | Qty: 30 | Fill #3 | Status: AC

## 2021-02-26 ENCOUNTER — Other Ambulatory Visit: Payer: Self-pay

## 2021-02-27 ENCOUNTER — Other Ambulatory Visit: Payer: Self-pay

## 2021-03-03 ENCOUNTER — Other Ambulatory Visit: Payer: Self-pay

## 2021-03-04 ENCOUNTER — Other Ambulatory Visit (HOSPITAL_COMMUNITY): Payer: Self-pay

## 2021-03-04 ENCOUNTER — Other Ambulatory Visit: Payer: Self-pay

## 2021-03-05 ENCOUNTER — Ambulatory Visit (HOSPITAL_COMMUNITY)
Admission: EM | Admit: 2021-03-05 | Discharge: 2021-03-05 | Disposition: A | Discharge status: Home / Self Care | Payer: Self-pay | Attending: Emergency Medicine | Admitting: Emergency Medicine

## 2021-03-05 ENCOUNTER — Encounter (HOSPITAL_COMMUNITY): Payer: Self-pay | Admitting: Emergency Medicine

## 2021-03-05 ENCOUNTER — Other Ambulatory Visit: Payer: Self-pay

## 2021-03-05 DIAGNOSIS — B80 Enterobiasis: Secondary | ICD-10-CM

## 2021-03-05 DIAGNOSIS — R1084 Generalized abdominal pain: Secondary | ICD-10-CM | POA: Insufficient documentation

## 2021-03-05 DIAGNOSIS — N898 Other specified noninflammatory disorders of vagina: Secondary | ICD-10-CM

## 2021-03-05 MED ORDER — FLUCONAZOLE 150 MG PO TABS
150.0000 mg | ORAL_TABLET | Freq: Every day | ORAL | 0 refills | Status: AC
  Filled 2021-03-05: qty 2, 2d supply, fill #0

## 2021-03-05 MED ORDER — ALBENDAZOLE 200 MG PO TABS
400.0000 mg | ORAL_TABLET | Freq: Two times a day (BID) | ORAL | 0 refills | Status: AC
  Filled 2021-03-05: qty 12, 3d supply, fill #0

## 2021-03-05 NOTE — ED Triage Notes (Signed)
Noticed worms in toilet yesterday.  Patient also has seen blood tinged stool and with wiping.  Patient says she has noticed "worms" even without stool.  Patient has generalized body aches

## 2021-03-05 NOTE — Discharge Instructions (Signed)
It is possible that your symptoms are being caused by pinworms also possible that is being caused by a virus  You may take Albenza twice a day for the next 3 days, if pinworms are present this will help clear symptoms, you may follow-up with your primary care doctor for reevaluation of your stool  If symptoms are being caused by a virus they will have to resolve on their own and you may use over-the-counter ibuprofen or Tylenol to help manage her abdominal pain  At any point if your abdominal pain worsens in severity please go to the nearest emergency department for further evaluation  Your STI screening is pending, you are being treated prophylactically for yeast, take Diflucan once and then in 3 days if symptoms are still present you may take second dose

## 2021-03-05 NOTE — ED Provider Notes (Signed)
[place]    CSN: [id number] Arrival date & time: 03/05/21  1842      History   Chief Complaint Chief Complaint  Patient presents with   Rectal Bleeding   worms from rectum    HPI Sherri [name] is a 36 y.o. female.   Patient presents with fatigue, decreased appetite, body aches, generalized abdominal pain, vomiting and anal bleeding for 1 day.  Endorses she saw blood on tissue with pinworms when she wiped last night .  Sample brought with patient.  No known sick contacts.  Has not attempted treatment of symptoms.  History of hypertension.  Denies fever, chills, bowel changes, URI symptoms, headaches, hematochezia,  urinary changes.  Patient concerned with Davina Howlett thick vaginal discharge, vaginal itching and irritation for 4 days.  Has not attempted treatment.  Last sexual encounter 5 months ago.  No known exposures.   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    Urinary tract infection    Uterine fibroid     Patient Active Problem List   Diagnosis Date Noted   Prediabetes 07/05/2020   Gastroesophageal reflux disease 04/26/2020   Perianal rash 03/26/2020   Chronic nausea 03/26/2020   Chronic constipation 03/09/2019   Pelvic pain in female 03/09/2019   Hypertension    Genital herpes    Uterine fibroid 12/25/2013   Obesity 12/25/2013   Bacterial vaginitis 04/27/2012    Past Surgical History:  Procedure Laterality Date   CESAREAN SECTION N/A 06/05/2014   Procedure: CESAREAN SECTION;  Surgeon: Truett Mainland, DO;  Location: [facility];  Service: Obstetrics;  Laterality: N/A;   WISDOM TOOTH EXTRACTION      OB History     Gravida  1   Para  1   Term      Preterm  1   AB      Living  2      SAB      IAB      Ectopic      Multiple  1   Live Births  2            Home Medications    Prior to  Admission medications   Medication Sig Start Date End Date Taking? Authorizing Provider  acetaminophen (TYLENOL) 500 MG tablet TAKE 2 TABLETS (1,000 MG TOTAL) BY MOUTH EVERY 6 (SIX) HOURS AS NEEDED FOR MODERATE PAIN, FEVER OR HEADACHE. 04/30/20 04/30/21  Domenic Moras, PA-C  amitriptyline (ELAVIL) 25 MG tablet Take 1 tablet (25 mg total) by mouth at bedtime. 12/31/20 03/31/21  Camillia Herter, NP  azelastine (ASTELIN) 0.1 % nasal spray Place 2 sprays into both nostrils 2 (two) times daily. 02/10/20   Tasia Catchings, Amy V, PA-C  benzonatate (TESSALON) 100 MG capsule Take 1 capsule (100 mg total) by mouth every 8 (eight) hours. Patient not taking: Reported on 03/05/2021 02/11/21   Raspet, Derry Skill, PA-C  cefdinir (OMNICEF) 300 MG capsule Take 1 capsule (300 mg total) by mouth 2 (two) times daily. Patient not taking: Reported on 03/05/2021 02/15/21   Raspet, Derry Skill, PA-C  cetirizine (ZYRTEC ALLERGY) 10 MG tablet Take 1 tablet (10 mg total) by mouth daily. 12/31/20   Camillia Herter, NP  Cholecalciferol (VITAMIN D3) 125 MCG (5000 UT) CAPS Take 1 capsule by mouth daily.    [provider]  Ferrous Sulfate (IRON) 28 MG TABS Take 1 tablet by mouth daily.    [provider]  fluticasone (FLONASE) 50 MCG/ACT nasal spray Place 2 sprays into both nostrils daily. 11/26/20   Camillia Herter, NP  glycopyrrolate (ROBINUL) 1 MG tablet Take 1 tablet (1 mg total) by mouth 2 (two) times daily. 02/03/21   Ladene Artist, MD  Hyoscyamine Sulfate SL (LEVSIN/SL) 0.125 MG SUBL Place 0.125 mg under the tongue 2 (two) times daily as needed. 12/25/20   Raspet, Derry Skill, PA-C  ibuprofen (ADVIL) 600 MG tablet Take 1 tablet (600 mg total) by mouth every 8 (eight) hours as needed. 12/31/20   Camillia Herter, NP  metroNIDAZOLE (METROGEL) 0.75 % vaginal gel Place 1 Applicatorful vaginally 2 (two) times daily for 14 days. Patient not taking: Reported on 03/05/2021 01/29/21 03/12/21  Camillia Herter, NP  mirtazapine (REMERON) 15 MG tablet TAKE 2  TABLETS (30 MG TOTAL) BY MOUTH AT BEDTIME. 04/26/20 04/26/21  Zehr, Janett Billow D, PA-C  ondansetron (ZOFRAN-ODT) 4 MG disintegrating tablet Take 1 tablet (4 mg total) by mouth every 8 (eight) hours as needed for nausea or vomiting. 02/17/21   Camillia Herter, NP  pantoprazole (PROTONIX) 40 MG tablet TAKE 1 TABLET (40 MG TOTAL) BY MOUTH DAILY. 04/26/20 04/26/21  Zehr, Laban Emperor, PA-C  pseudoephedrine (SUDAFED) 60 MG tablet Take 1 tablet (60 mg total) by mouth every 8 (eight) hours as needed for congestion. 11/29/20   Jaynee Eagles, PA-C  tiZANidine (ZANAFLEX) 4 MG tablet Take 1 tablet (4 mg total) by mouth every 6 (six) hours as needed for muscle spasms. 10/28/20   Pearson Forster, NP    Family History Family History  Problem Relation Age of Onset   Hypertension Mother    Hypertension Father    Breast cancer Maternal Grandmother        35   Colon cancer Neg Hx    Colon polyps Neg Hx    Kidney disease Neg Hx    Diabetes Neg Hx    Esophageal cancer Neg Hx    Gallbladder disease Neg Hx    Heart disease Neg Hx    Asthma Neg Hx    Stroke Neg Hx    Stomach cancer Neg Hx     Social History Social History   Tobacco Use   Smoking status: Never   Smokeless tobacco: Never  Vaping Use   Vaping Use: Never used  Substance Use Topics   Alcohol use: No   Drug use: No     Allergies   Macrobid [nitrofurantoin]   Review of Systems Review of Systems  Constitutional:  Positive for appetite change and fatigue. Negative for activity change, chills, diaphoresis, fever and unexpected weight change.  Respiratory: Negative.    Cardiovascular: Negative.   Gastrointestinal:  Positive for abdominal pain, anal bleeding and nausea. Negative for abdominal distention, blood in stool, constipation, diarrhea, rectal pain and vomiting.  Skin: Negative.   Neurological: Negative.     Physical Exam Triage Vital Signs ED Triage Vitals  Enc Vitals Group     BP 03/05/21 2012 126/87     Pulse Rate 03/05/21 2012  91     Resp 03/05/21 2012 18     Temp 03/05/21 2012 98 F (36.7 C)     Temp Source 03/05/21 2012 Oral     SpO2 03/05/21 2012 98 %     Weight --      Height --      Head  Circumference --      Peak Flow --      Pain Score 03/05/21 2008 9     Pain Loc --      Pain Edu? --      Excl. in [place]? --    No data found.  Updated Vital Signs BP 126/87 (BP Location: Left Arm) Comment (BP Location): large cuff   Pulse 91    Temp 98 F (36.7 C) (Oral)    Resp 18    LMP 02/24/2021    SpO2 98%   Visual Acuity Right Eye Distance:   Left Eye Distance:   Bilateral Distance:    Right Eye Near:   Left Eye Near:    Bilateral Near:     Physical Exam Constitutional:      Appearance: Normal appearance.  HENT:     Head: Normocephalic.  Eyes:     Extraocular Movements: Extraocular movements intact.  Pulmonary:     Effort: Pulmonary effort is normal.  Abdominal:     General: Abdomen is flat. Bowel sounds are normal.     Palpations: Abdomen is soft.     Tenderness: There is generalized abdominal tenderness.  Skin:    General: Skin is warm and dry.  Neurological:     Mental Status: She is alert and oriented to person, place, and time. Mental status is at baseline.  Psychiatric:        Mood and Affect: Mood normal.        Behavior: Behavior normal.     UC Treatments / Results  Labs (all labs ordered are listed, but only abnormal results are displayed) Labs Reviewed - No data to display  EKG   Radiology No results found.  Procedures Procedures (including critical care time)  Medications Ordered in UC Medications - No data to display  Initial Impression / Assessment and Plan / UC Course  I have reviewed the triage vital signs and the nursing notes.  Pertinent labs & imaging results that were available during my care of the patient were reviewed by me and considered in my medical decision making (see chart for details).  Pinworms Generalized abdominal pain Vaginal  discharge  Etiology of symptoms possible pinworms, unable to visualize any bone sample brought by patient, etiology could also possibly be viral, discussed with patient will begin treatment for pinworms with follow-up with PCP for reevaluation of stool, Albenza prescribed for 3-day course, patient having generalized abdominal pain on exam, low suspicion of acute abdomen, recommended watchful waiting with strict return precautions to go to nearest emergency department if it increases in severity, STI screening is pending, will treat per protocol, advised continued abstinence until labs results and symptoms have resolved, prescribed  fluconazole to prophylactically treat for yeast infection, UC follow up as needed Final Clinical Impressions(s) / UC Diagnoses   Final diagnoses:  None   Discharge Instructions   None    ED Prescriptions   None    PDMP not reviewed this encounter.   Hans Eden, [place] 03/06/21 [phone]

## 2021-03-06 ENCOUNTER — Other Ambulatory Visit (HOSPITAL_COMMUNITY): Payer: Self-pay

## 2021-03-06 LAB — CERVICOVAGINAL ANCILLARY ONLY
Bacterial Vaginitis (gardnerella): NEGATIVE
Candida Glabrata: NEGATIVE
Candida Vaginitis: NEGATIVE
Chlamydia: NEGATIVE
Comment: NEGATIVE
Comment: NEGATIVE
Comment: NEGATIVE
Comment: NEGATIVE
Comment: NEGATIVE
Comment: NORMAL
Neisseria Gonorrhea: NEGATIVE
Trichomonas: NEGATIVE

## 2021-03-17 ENCOUNTER — Telehealth: Payer: Self-pay | Admitting: Family

## 2021-03-17 ENCOUNTER — Other Ambulatory Visit: Payer: Self-pay | Admitting: Family

## 2021-03-17 DIAGNOSIS — R110 Nausea: Secondary | ICD-10-CM

## 2021-03-17 MED ORDER — ONDANSETRON 4 MG PO TBDP
4.0000 mg | ORAL_TABLET | Freq: Three times a day (TID) | ORAL | 2 refills | Status: DC | PRN
  Filled 2021-03-17 – 2021-03-18 (×2): qty 20, 7d supply, fill #0
  Filled 2021-04-08: qty 20, 7d supply, fill #1
  Filled 2021-04-21: qty 20, 7d supply, fill #2

## 2021-03-17 NOTE — Telephone Encounter (Signed)
°  Pt requesting refill for Nausea (states struggling w/ this daily) Rx #: [id number]  pseudoephedrine (SUDAFED) 60 MG tablet [[id number]]  Requesting please send to [place] also doesn't recall name of the Medication for BV but states is still having the [facility] and doesn't know what to do or what to take.

## 2021-03-17 NOTE — Telephone Encounter (Signed)
Zofran refilled for nausea. Referred to Gastroenterology on 12/31/2020 for chronic abdominal pain with nausea. Please call to schedule appointment soon.  [facility]  [address] [facility] [address]  Ph# [phone] Fax [phone]  Patient referred to Gynecology on 01/29/2021 for recurrent bacterial vaginitis. Please call to schedule appointment soon. [facility] [place], [place] [address]  Ph# [phone]

## 2021-03-18 ENCOUNTER — Other Ambulatory Visit: Payer: Self-pay

## 2021-03-18 ENCOUNTER — Other Ambulatory Visit (HOSPITAL_COMMUNITY): Payer: Self-pay

## 2021-03-27 ENCOUNTER — Encounter (HOSPITAL_COMMUNITY): Payer: Self-pay | Admitting: Emergency Medicine

## 2021-03-27 ENCOUNTER — Other Ambulatory Visit: Payer: Self-pay

## 2021-03-27 ENCOUNTER — Ambulatory Visit (HOSPITAL_COMMUNITY)
Admission: EM | Admit: 2021-03-27 | Discharge: 2021-03-27 | Disposition: A | Discharge status: Home / Self Care | Payer: Self-pay | Attending: Family Medicine | Admitting: Family Medicine

## 2021-03-27 DIAGNOSIS — J321 Chronic frontal sinusitis: Secondary | ICD-10-CM

## 2021-03-27 MED ORDER — CEFDINIR 300 MG PO CAPS
300.0000 mg | ORAL_CAPSULE | Freq: Two times a day (BID) | ORAL | 0 refills | Status: AC
  Filled 2021-03-27: qty 20, 10d supply, fill #0

## 2021-03-27 MED ORDER — AMOXICILLIN-POT CLAVULANATE 875-125 MG PO TABS
1.0000 | ORAL_TABLET | Freq: Two times a day (BID) | ORAL | 0 refills | Status: DC
  Filled 2021-03-27: qty 14, 7d supply, fill #0

## 2021-03-27 NOTE — ED Notes (Signed)
On discharge, patient reports antibiotic prescribed causes stomach upset, and states she has taken it with food and still caused GI upset.  Will notify provider

## 2021-03-27 NOTE — ED Provider Notes (Signed)
[place]    CSN: [id number] Arrival date & time: 03/27/21  7341      History   Chief Complaint Chief Complaint  Patient presents with   URI    HPI Sherri [name] is a 36 y.o. female.   Patient is here for runny nose, congestion, drainage, cough x 4 days.  + sinus pain, pressure.  She feels feverish but has not checked her temp;   No wheezing or sob.  Used motrin/tylenol without much help.   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    Urinary tract infection    Uterine fibroid     Patient Active Problem List   Diagnosis Date Noted   Prediabetes 07/05/2020   Gastroesophageal reflux disease 04/26/2020   Perianal rash 03/26/2020   Chronic nausea 03/26/2020   Chronic constipation 03/09/2019   Pelvic pain in female 03/09/2019   Hypertension    Genital herpes    Uterine fibroid 12/25/2013   Obesity 12/25/2013   Bacterial vaginitis 04/27/2012    Past Surgical History:  Procedure Laterality Date   CESAREAN SECTION N/A 06/05/2014   Procedure: CESAREAN SECTION;  Surgeon: Truett Mainland, DO;  Location: [facility];  Service: Obstetrics;  Laterality: N/A;   WISDOM TOOTH EXTRACTION      OB History     Gravida  1   Para  1   Term      Preterm  1   AB      Living  2      SAB      IAB      Ectopic      Multiple  1   Live Births  2            Home Medications    Prior to Admission medications   Medication Sig Start Date End Date Taking? Authorizing Provider  acetaminophen (TYLENOL) 500 MG tablet TAKE 2 TABLETS (1,000 MG TOTAL) BY MOUTH EVERY 6 (SIX) HOURS AS NEEDED FOR MODERATE PAIN, FEVER OR HEADACHE. 04/30/20 04/30/21  Domenic Moras, PA-C  amitriptyline (ELAVIL) 25 MG tablet Take 1 tablet (25 mg total) by mouth at bedtime. 12/31/20 03/31/21  Camillia Herter, NP  azelastine (ASTELIN) 0.1 % nasal spray  Place 2 sprays into both nostrils 2 (two) times daily. 02/10/20   Tasia Catchings, Amy V, PA-C  benzonatate (TESSALON) 100 MG capsule Take 1 capsule (100 mg total) by mouth every 8 (eight) hours. Patient not taking: Reported on 03/05/2021 02/11/21   Raspet, Derry Skill, PA-C  cefdinir (OMNICEF) 300 MG capsule Take 1 capsule (300 mg total) by mouth 2 (two) times daily. Patient not taking: Reported on 03/05/2021 02/15/21   Raspet, Derry Skill, PA-C  cetirizine (ZYRTEC ALLERGY) 10 MG tablet Take 1 tablet (10 mg total) by mouth daily. 12/31/20   Camillia Herter, NP  Cholecalciferol (VITAMIN D3) 125 MCG (5000 UT) CAPS Take 1 capsule by mouth daily.    [provider]  Ferrous Sulfate (IRON) 28 MG TABS Take 1 tablet by mouth daily.    [provider]  fluticasone (FLONASE) 50 MCG/ACT nasal spray Place 2 sprays into both nostrils daily. 11/26/20   Camillia Herter, NP  glycopyrrolate (ROBINUL) 1 MG tablet Take 1 tablet (1 mg total) by mouth 2 (two) times daily. 02/03/21   Ladene Artist, MD  Hyoscyamine Sulfate SL (LEVSIN/SL) 0.125 MG SUBL Place 0.125 mg under the tongue 2 (two) times daily as needed. 12/25/20   Raspet, Derry Skill, PA-C  ibuprofen (ADVIL) 600 MG tablet Take 1 tablet (600 mg total) by mouth every 8 (eight) hours as needed. 12/31/20   Camillia Herter, NP  mirtazapine (REMERON) 15 MG tablet TAKE 2 TABLETS (30 MG TOTAL) BY MOUTH AT BEDTIME. 04/26/20 04/26/21  Zehr, Janett Billow D, PA-C  ondansetron (ZOFRAN-ODT) 4 MG disintegrating tablet Dissolve 1 tablet (4 mg total) by mouth every 8 (eight) hours as needed for nausea or vomiting. 03/17/21   Camillia Herter, NP  pantoprazole (PROTONIX) 40 MG tablet TAKE 1 TABLET (40 MG TOTAL) BY MOUTH DAILY. 04/26/20 04/26/21  Zehr, Laban Emperor, PA-C  pseudoephedrine (SUDAFED) 60 MG tablet Take 1 tablet (60 mg total) by mouth every 8 (eight) hours as needed for congestion. 11/29/20   Jaynee Eagles, PA-C  tiZANidine (ZANAFLEX) 4 MG tablet Take 1 tablet (4 mg total) by mouth every 6 (six)  hours as needed for muscle spasms. 10/28/20   Pearson Forster, NP    Family History Family History  Problem Relation Age of Onset   Hypertension Mother    Hypertension Father    Breast cancer Maternal Grandmother        57   Colon cancer Neg Hx    Colon polyps Neg Hx    Kidney disease Neg Hx    Diabetes Neg Hx    Esophageal cancer Neg Hx    Gallbladder disease Neg Hx    Heart disease Neg Hx    Asthma Neg Hx    Stroke Neg Hx    Stomach cancer Neg Hx     Social History Social History   Tobacco Use   Smoking status: Never   Smokeless tobacco: Never  Vaping Use   Vaping Use: Never used  Substance Use Topics   Alcohol use: No   Drug use: No     Allergies   Macrobid [nitrofurantoin]   Review of Systems Review of Systems  Constitutional:  Positive for fatigue. Negative for chills and fever.  HENT:  Positive for congestion, rhinorrhea, sinus pressure, sinus pain and sneezing. Negative for ear pain and sore throat.   Eyes: Negative.   Respiratory:  Positive for cough.   Cardiovascular: Negative.   Gastrointestinal: Negative.     Physical Exam Triage Vital Signs ED Triage Vitals  Enc Vitals Group     BP 03/27/21 0853 122/81     Pulse Rate 03/27/21 0853 98     Resp 03/27/21 0853 18     Temp 03/27/21 0853 98.8 F (37.1 C)     Temp Source 03/27/21 0853 Oral     SpO2 03/27/21 0853 96 %     Weight --      Height --      Head Circumference --      Peak Flow --      Pain Score 03/27/21 0850 8     Pain Loc --      Pain Edu? --      Excl. in [place]? --    No data found.  Updated Vital Signs BP 122/81 (BP Location: Left Arm) Comment (BP Location): large cuff   Pulse 98    Temp 98.8 F (37.1 C) (Oral)    Resp 18    LMP 03/22/2021    SpO2 96%   Visual Acuity Right Eye Distance:   Left Eye Distance:  Bilateral Distance:    Right Eye Near:   Left Eye Near:    Bilateral Near:     Physical Exam Constitutional:      Appearance: Normal appearance.  HENT:      Right Ear: A middle ear effusion is present.     Left Ear: A middle ear effusion is present.     Nose:     Right Sinus: Maxillary sinus tenderness and frontal sinus tenderness present.     Left Sinus: Maxillary sinus tenderness and frontal sinus tenderness present.     Mouth/Throat:     Pharynx: Posterior oropharyngeal erythema present. No pharyngeal swelling or oropharyngeal exudate.  Cardiovascular:     Rate and Rhythm: Normal rate and regular rhythm.  Pulmonary:     Effort: Pulmonary effort is normal.     Breath sounds: Normal breath sounds.  Musculoskeletal:     Cervical back: Normal range of motion. Tenderness present.  Neurological:     Mental Status: She is alert.     UC Treatments / Results  Labs (all labs ordered are listed, but only abnormal results are displayed) Labs Reviewed - No data to display  EKG   Radiology No results found.  Procedures Procedures (including critical care time)  Medications Ordered in UC Medications - No data to display  Initial Impression / Assessment and Plan / UC Course  I have reviewed the triage vital signs and the nursing notes.  Pertinent labs & imaging results that were available during my care of the patient were reviewed by me and considered in my medical decision making (see chart for details).  Patient was seen for URI symptoms.  Recurrent for her.  I have sent out augmentin for presumed sinusitis.  I also recommend flonse, zyrtec, mucinex for her symptoms.  She likely could benefit from continued use of flonase/zyrtec for chronic sinus issues.   Final Clinical Impressions(s) / UC Diagnoses   Final diagnoses:  Chronic frontal sinusitis     Discharge Instructions      You were seen today for sinusitis.  I have sent out an antibiotic to your pharmacy to take twice/day x 10 days.  Please take with food to avoid upset stomach.  I recommend you trial over the counter flonase to help with nasal congestion and pressure.   You may also take over the counter mucinex to help with cough.  Please continue tylenol and motrin as well.     ED Prescriptions     Medication Sig Dispense Auth. Provider   amoxicillin-clavulanate (AUGMENTIN) 875-125 MG tablet Take 1 tablet by mouth every 12 (twelve) hours. 14 tablet Rondel Oh, MD      PDMP not reviewed this encounter.   Rondel Oh, MD 03/27/21 [phone]

## 2021-03-27 NOTE — ED Triage Notes (Signed)
Onset of symptoms Monday, 03/24/2021.  Reports headache, congestion, runny nose, cough.

## 2021-03-27 NOTE — Discharge Instructions (Addendum)
You were seen today for sinusitis.  I have sent out an antibiotic to your pharmacy to take twice/day x 10 days.  Please take with food to avoid upset stomach.  I recommend you trial over the counter flonase to help with nasal congestion and pressure.  You may also take over the counter mucinex to help with cough.  Please continue tylenol and motrin as well.

## 2021-03-29 NOTE — Progress Notes (Deleted)
Patient ID: Sherri [name], female    DOB: [birth year]  MRN: [id number]  CC: No chief complaint on file.   Subjective: Sherri [name] is a 36 y.o. female who presents for Her concerns today include:  URGENT CARE FOLLOW-UP: 03/27/2021 at [facility] at [facility] per MD note: Patient was seen for URI symptoms.  Recurrent for her.  I have sent out augmentin for presumed sinusitis.  I also recommend flonse, zyrtec, mucinex for her symptoms.  She likely could benefit from continued use of flonase/zyrtec for chronic sinus issues.    04/03/2021:   Patient Active Problem List   Diagnosis Date Noted   Prediabetes 07/05/2020   Gastroesophageal reflux disease 04/26/2020   Perianal rash 03/26/2020   Chronic nausea 03/26/2020   Chronic constipation 03/09/2019   Pelvic pain in female 03/09/2019   Hypertension    Genital herpes    Uterine fibroid 12/25/2013   Obesity 12/25/2013   Bacterial vaginitis 04/27/2012     Current Outpatient Medications on File Prior to Visit  Medication Sig Dispense Refill   acetaminophen (TYLENOL) 500 MG tablet TAKE 2 TABLETS (1,000 MG TOTAL) BY MOUTH EVERY 6 (SIX) HOURS AS NEEDED FOR MODERATE PAIN, FEVER OR HEADACHE. 30 tablet 0   amitriptyline (ELAVIL) 25 MG tablet Take 1 tablet (25 mg total) by mouth at bedtime. 30 tablet 2   azelastine (ASTELIN) 0.1 % nasal spray Place 2 sprays into both nostrils 2 (two) times daily. 30 mL 0   cefdinir (OMNICEF) 300 MG capsule Take 1 capsule (300 mg total) by mouth 2 (two) times daily for 10 days. 20 capsule 0   cetirizine (ZYRTEC ALLERGY) 10 MG tablet Take 1 tablet (10 mg total) by mouth daily. 30 tablet 11   Cholecalciferol (VITAMIN D3) 125 MCG (5000 UT) CAPS Take 1 capsule by mouth daily.     Ferrous Sulfate (IRON) 28 MG TABS Take 1 tablet by mouth daily.     fluticasone (FLONASE) 50 MCG/ACT nasal spray Place 2 sprays into both nostrils daily. 16 g 0   glycopyrrolate (ROBINUL) 1 MG tablet Take 1 tablet (1 mg  total) by mouth 2 (two) times daily. 60 tablet 11   Hyoscyamine Sulfate SL (LEVSIN/SL) 0.125 MG SUBL Place 0.125 mg under the tongue 2 (two) times daily as needed. 30 tablet 0   ibuprofen (ADVIL) 600 MG tablet Take 1 tablet (600 mg total) by mouth every 8 (eight) hours as needed. 30 tablet 0   mirtazapine (REMERON) 15 MG tablet TAKE 2 TABLETS (30 MG TOTAL) BY MOUTH AT BEDTIME. 30 tablet 1   ondansetron (ZOFRAN-ODT) 4 MG disintegrating tablet Dissolve 1 tablet (4 mg total) by mouth every 8 (eight) hours as needed for nausea or vomiting. 20 tablet 2   pantoprazole (PROTONIX) 40 MG tablet TAKE 1 TABLET (40 MG TOTAL) BY MOUTH DAILY. 30 tablet 5   pseudoephedrine (SUDAFED) 60 MG tablet Take 1 tablet (60 mg total) by mouth every 8 (eight) hours as needed for congestion. 30 tablet 0   tiZANidine (ZANAFLEX) 4 MG tablet Take 1 tablet (4 mg total) by mouth every 6 (six) hours as needed for muscle spasms. 20 tablet 0   Current Facility-Administered Medications on File Prior to Visit  Medication Dose Route Frequency Provider Last Rate Last Admin   methylPREDNISolone sodium succinate (SOLU-MEDROL) 40 mg/mL injection 40 mg  40 mg Intramuscular Once Camillia Herter, NP        Allergies  Allergen Reactions   Macrobid [Nitrofurantoin]  Nausea And Vomiting   Augmentin [Amoxicillin-Pot Clavulanate]     Pt states it makes her sick    Social History   Socioeconomic History   Marital status: Single    Spouse name: Not on file   Number of children: Not on file   Years of education: Not on file   Highest education level: Not on file  Occupational History   Occupation: [employer]  Tobacco Use   Smoking status: Never   Smokeless tobacco: Never  Vaping Use   Vaping Use: Never used  Substance and Sexual Activity   Alcohol use: No   Drug use: No   Sexual activity: Yes    Birth control/protection: None  Other Topics Concern   Not on file  Social History Narrative   Not on file   Social Determinants of  Health   Financial Resource Strain: Not on file  Food Insecurity: Not on file  Transportation Needs: Not on file  Physical Activity: Not on file  Stress: Not on file  Social Connections: Not on file  Intimate Partner Violence: Not on file    Family History  Problem Relation Age of Onset   Hypertension Mother    Hypertension Father    Breast cancer Maternal Grandmother        96   Colon cancer Neg Hx    Colon polyps Neg Hx    Kidney disease Neg Hx    Diabetes Neg Hx    Esophageal cancer Neg Hx    Gallbladder disease Neg Hx    Heart disease Neg Hx    Asthma Neg Hx    Stroke Neg Hx    Stomach cancer Neg Hx     Past Surgical History:  Procedure Laterality Date   CESAREAN SECTION N/A 06/05/2014   Procedure: CESAREAN SECTION;  Surgeon: Truett Mainland, DO;  Location: [facility];  Service: Obstetrics;  Laterality: N/A;   WISDOM TOOTH EXTRACTION      ROS: Review of Systems Negative except as stated above  PHYSICAL EXAM: LMP 03/22/2021   Physical Exam  {female adult master:310786} {female adult master:310785}  CMP Latest Ref Rng & Units 01/21/2021 09/05/2020 09/04/2020  Glucose 70 - 99 mg/dL 83 103(H) 91  BUN 6 - 20 mg/dL 10 11 6   Creatinine 0.57 - 1.00 mg/dL 0.83 0.83 0.87  Sodium 134 - 144 mmol/L 139 138 136  Potassium 3.5 - 5.2 mmol/L 4.2 4.0 3.8  Chloride 96 - 106 mmol/L 101 105 105  CO2 20 - 29 mmol/L 22 25 22   Calcium 8.7 - 10.2 mg/dL 9.7 9.6 9.7  Total Protein 6.0 - 8.5 g/dL 7.3 7.9 8.0  Total Bilirubin 0.0 - 1.2 mg/dL <0.2 0.5 0.7  Alkaline Phos 44 - 121 IU/L 97 64 73  AST 0 - 40 IU/L 12 15 19   ALT 0 - 32 IU/L 12 14 16    Lipid Panel     Component Value Date/Time   CHOL 252 (H) 07/03/2020 1409   TRIG 129 07/03/2020 1409   HDL 52 07/03/2020 1409   CHOLHDL 4.8 (H) 07/03/2020 1409   CHOLHDL 3.5 01/07/2017 1419   LDLCALC 177 (H) 07/03/2020 1409   LDLCALC 128 (H) 01/07/2017 1419    CBC    Component Value Date/Time   WBC 8.0 01/21/2021 1525   WBC 5.1  09/05/2020 0656   RBC 4.59 01/21/2021 1525   RBC 4.53 09/05/2020 0656   HGB 13.3 01/21/2021 1525   HCT 40.3 01/21/2021 1525   PLT 251  01/21/2021 1525   MCV 88 01/21/2021 1525   MCH 29.0 01/21/2021 1525   MCH 28.9 09/05/2020 0656   MCHC 33.0 01/21/2021 1525   MCHC 32.3 09/05/2020 0656   RDW 14.4 01/21/2021 1525   LYMPHSABS 2.0 09/05/2020 0656   LYMPHSABS 2.8 07/14/2019 0950   MONOABS 0.4 09/05/2020 0656   EOSABS 0.1 09/05/2020 0656   EOSABS 0.2 07/14/2019 0950   BASOSABS 0.0 09/05/2020 0656   BASOSABS 0.0 07/14/2019 0950    ASSESSMENT AND PLAN:  There are no diagnoses linked to this encounter.   Patient was given the opportunity to ask questions.  Patient verbalized understanding of the plan and was able to repeat key elements of the plan. Patient was given clear instructions to go to Emergency Department or return to medical center if symptoms don't improve, worsen, or new problems develop.The patient verbalized understanding.   No orders of the defined types were placed in this encounter.    Requested Prescriptions    No prescriptions requested or ordered in this encounter    No follow-ups on file.  Camillia Herter, NP

## 2021-04-02 ENCOUNTER — Ambulatory Visit: Payer: Self-pay | Admitting: Family

## 2021-04-03 ENCOUNTER — Other Ambulatory Visit: Payer: Self-pay

## 2021-04-03 ENCOUNTER — Ambulatory Visit: Payer: Self-pay | Admitting: Family

## 2021-04-03 NOTE — Progress Notes (Signed)
Erroneous encounter

## 2021-04-04 ENCOUNTER — Encounter: Payer: Self-pay | Admitting: Family

## 2021-04-04 NOTE — Progress Notes (Deleted)
Patient ID: Sherri [name], female    DOB: 1985-11-20  MRN: [id number]  CC: No chief complaint on file.   Subjective: Sherri [name] is a 36 y.o. female who presents for Her concerns today include: ***  Patient Active Problem List   Diagnosis Date Noted   Prediabetes 07/05/2020   Gastroesophageal reflux disease 04/26/2020   Perianal rash 03/26/2020   Chronic nausea 03/26/2020   Chronic constipation 03/09/2019   Pelvic pain in female 03/09/2019   Hypertension    Genital herpes    Uterine fibroid 12/25/2013   Obesity 12/25/2013   Bacterial vaginitis 04/27/2012     Current Outpatient Medications on File Prior to Visit  Medication Sig Dispense Refill   acetaminophen (TYLENOL) 500 MG tablet TAKE 2 TABLETS (1,000 MG TOTAL) BY MOUTH EVERY 6 (SIX) HOURS AS NEEDED FOR MODERATE PAIN, FEVER OR HEADACHE. 30 tablet 0   amitriptyline (ELAVIL) 25 MG tablet Take 1 tablet (25 mg total) by mouth at bedtime. 30 tablet 2   azelastine (ASTELIN) 0.1 % nasal spray Place 2 sprays into both nostrils 2 (two) times daily. 30 mL 0   cefdinir (OMNICEF) 300 MG capsule Take 1 capsule (300 mg total) by mouth 2 (two) times daily for 10 days. 20 capsule 0   cetirizine (ZYRTEC ALLERGY) 10 MG tablet Take 1 tablet (10 mg total) by mouth daily. 30 tablet 11   Cholecalciferol (VITAMIN D3) 125 MCG (5000 UT) CAPS Take 1 capsule by mouth daily.     Ferrous Sulfate (IRON) 28 MG TABS Take 1 tablet by mouth daily.     fluticasone (FLONASE) 50 MCG/ACT nasal spray Place 2 sprays into both nostrils daily. 16 g 0   glycopyrrolate (ROBINUL) 1 MG tablet Take 1 tablet (1 mg total) by mouth 2 (two) times daily. 60 tablet 11   Hyoscyamine Sulfate SL (LEVSIN/SL) 0.125 MG SUBL Place 0.125 mg under the tongue 2 (two) times daily as needed. 30 tablet 0   ibuprofen (ADVIL) 600 MG tablet Take 1 tablet (600 mg total) by mouth every 8 (eight) hours as needed. 30 tablet 0   mirtazapine (REMERON) 15 MG tablet TAKE 2 TABLETS (30 MG TOTAL)  BY MOUTH AT BEDTIME. 30 tablet 1   ondansetron (ZOFRAN-ODT) 4 MG disintegrating tablet Dissolve 1 tablet (4 mg total) by mouth every 8 (eight) hours as needed for nausea or vomiting. 20 tablet 2   pantoprazole (PROTONIX) 40 MG tablet TAKE 1 TABLET (40 MG TOTAL) BY MOUTH DAILY. 30 tablet 5   pseudoephedrine (SUDAFED) 60 MG tablet Take 1 tablet (60 mg total) by mouth every 8 (eight) hours as needed for congestion. 30 tablet 0   tiZANidine (ZANAFLEX) 4 MG tablet Take 1 tablet (4 mg total) by mouth every 6 (six) hours as needed for muscle spasms. 20 tablet 0   Current Facility-Administered Medications on File Prior to Visit  Medication Dose Route Frequency Provider Last Rate Last Admin   methylPREDNISolone sodium succinate (SOLU-MEDROL) 40 mg/mL injection 40 mg  40 mg Intramuscular Once Camillia Herter, NP        Allergies  Allergen Reactions   Macrobid [Nitrofurantoin] Nausea And Vomiting   Augmentin [Amoxicillin-Pot Clavulanate]     Pt states it makes her sick    Social History   Socioeconomic History   Marital status: Single    Spouse name: Not on file   Number of children: Not on file   Years of education: Not on file   Highest education level: Not  on file  Occupational History   Occupation: [employer]  Tobacco Use   Smoking status: Never   Smokeless tobacco: Never  Vaping Use   Vaping Use: Never used  Substance and Sexual Activity   Alcohol use: No   Drug use: No   Sexual activity: Yes    Birth control/protection: None  Other Topics Concern   Not on file  Social History Narrative   Not on file   Social Determinants of Health   Financial Resource Strain: Not on file  Food Insecurity: Not on file  Transportation Needs: Not on file  Physical Activity: Not on file  Stress: Not on file  Social Connections: Not on file  Intimate Partner Violence: Not on file    Family History  Problem Relation Age of Onset   Hypertension Mother    Hypertension Father    Breast  cancer Maternal Grandmother        38   Colon cancer Neg Hx    Colon polyps Neg Hx    Kidney disease Neg Hx    Diabetes Neg Hx    Esophageal cancer Neg Hx    Gallbladder disease Neg Hx    Heart disease Neg Hx    Asthma Neg Hx    Stroke Neg Hx    Stomach cancer Neg Hx     Past Surgical History:  Procedure Laterality Date   CESAREAN SECTION N/A 06/05/2014   Procedure: CESAREAN SECTION;  Surgeon: Truett Mainland, DO;  Location: [facility];  Service: Obstetrics;  Laterality: N/A;   WISDOM TOOTH EXTRACTION      ROS: Review of Systems Negative except as stated above  PHYSICAL EXAM: LMP 03/22/2021   Physical Exam  {female adult master:310786} {female adult master:310785}  CMP Latest Ref Rng & Units 01/21/2021 09/05/2020 09/04/2020  Glucose 70 - 99 mg/dL 83 103(H) 91  BUN 6 - 20 mg/dL 10 11 6   Creatinine 0.57 - 1.00 mg/dL 0.83 0.83 0.87  Sodium 134 - 144 mmol/L 139 138 136  Potassium 3.5 - 5.2 mmol/L 4.2 4.0 3.8  Chloride 96 - 106 mmol/L 101 105 105  CO2 20 - 29 mmol/L 22 25 22   Calcium 8.7 - 10.2 mg/dL 9.7 9.6 9.7  Total Protein 6.0 - 8.5 g/dL 7.3 7.9 8.0  Total Bilirubin 0.0 - 1.2 mg/dL <0.2 0.5 0.7  Alkaline Phos 44 - 121 IU/L 97 64 73  AST 0 - 40 IU/L 12 15 19   ALT 0 - 32 IU/L 12 14 16    Lipid Panel     Component Value Date/Time   CHOL 252 (H) 07/03/2020 1409   TRIG 129 07/03/2020 1409   HDL 52 07/03/2020 1409   CHOLHDL 4.8 (H) 07/03/2020 1409   CHOLHDL 3.5 01/07/2017 1419   LDLCALC 177 (H) 07/03/2020 1409   LDLCALC 128 (H) 01/07/2017 1419    CBC    Component Value Date/Time   WBC 8.0 01/21/2021 1525   WBC 5.1 09/05/2020 0656   RBC 4.59 01/21/2021 1525   RBC 4.53 09/05/2020 0656   HGB 13.3 01/21/2021 1525   HCT 40.3 01/21/2021 1525   PLT 251 01/21/2021 1525   MCV 88 01/21/2021 1525   MCH 29.0 01/21/2021 1525   MCH 28.9 09/05/2020 0656   MCHC 33.0 01/21/2021 1525   MCHC 32.3 09/05/2020 0656   RDW 14.4 01/21/2021 1525   LYMPHSABS 2.0 09/05/2020 0656    LYMPHSABS 2.8 07/14/2019 0950   MONOABS 0.4 09/05/2020 0656   EOSABS 0.1 09/05/2020 0656  EOSABS 0.2 07/14/2019 0950   BASOSABS 0.0 09/05/2020 0656   BASOSABS 0.0 07/14/2019 0950    ASSESSMENT AND PLAN:  There are no diagnoses linked to this encounter.   Patient was given the opportunity to ask questions.  Patient verbalized understanding of the plan and was able to repeat key elements of the plan. Patient was given clear instructions to go to Emergency Department or return to medical center if symptoms don't improve, worsen, or new problems develop.The patient verbalized understanding.   No orders of the defined types were placed in this encounter.    Requested Prescriptions    No prescriptions requested or ordered in this encounter    No follow-ups on file.  Camillia Herter, NP

## 2021-04-07 ENCOUNTER — Ambulatory Visit: Payer: Self-pay | Admitting: Family

## 2021-04-08 ENCOUNTER — Ambulatory Visit (INDEPENDENT_AMBULATORY_CARE_PROVIDER_SITE_OTHER): Payer: Self-pay | Admitting: Family

## 2021-04-08 ENCOUNTER — Other Ambulatory Visit: Payer: Self-pay

## 2021-04-08 ENCOUNTER — Other Ambulatory Visit: Payer: Self-pay | Admitting: Family

## 2021-04-08 ENCOUNTER — Other Ambulatory Visit (HOSPITAL_COMMUNITY)
Admission: RE | Admit: 2021-04-08 | Discharge: 2021-04-08 | Disposition: A | Discharge status: Home / Self Care | Payer: Self-pay | Source: Ambulatory Visit | Attending: Family | Admitting: Family

## 2021-04-08 ENCOUNTER — Encounter: Payer: Self-pay | Admitting: Family

## 2021-04-08 VITALS — BP 149/83 | HR 80 | Resp 18 | Ht 58.5 in | Wt 199.1 lb

## 2021-04-08 DIAGNOSIS — N898 Other specified noninflammatory disorders of vagina: Secondary | ICD-10-CM | POA: Insufficient documentation

## 2021-04-08 DIAGNOSIS — F32A Depression, unspecified: Secondary | ICD-10-CM

## 2021-04-08 DIAGNOSIS — N760 Acute vaginitis: Secondary | ICD-10-CM

## 2021-04-08 DIAGNOSIS — N949 Unspecified condition associated with female genital organs and menstrual cycle: Secondary | ICD-10-CM

## 2021-04-08 DIAGNOSIS — M545 Low back pain, unspecified: Secondary | ICD-10-CM

## 2021-04-08 DIAGNOSIS — F419 Anxiety disorder, unspecified: Secondary | ICD-10-CM

## 2021-04-08 DIAGNOSIS — Z113 Encounter for screening for infections with a predominantly sexual mode of transmission: Secondary | ICD-10-CM

## 2021-04-08 DIAGNOSIS — B9689 Other specified bacterial agents as the cause of diseases classified elsewhere: Secondary | ICD-10-CM

## 2021-04-08 LAB — POCT URINALYSIS DIP (CLINITEK)
Bilirubin, UA: NEGATIVE
Glucose, UA: NEGATIVE mg/dL
Ketones, POC UA: NEGATIVE mg/dL
Leukocytes, UA: NEGATIVE
Nitrite, UA: NEGATIVE
POC PROTEIN,UA: NEGATIVE
Spec Grav, UA: 1.025 (ref 1.010–1.025)
Urobilinogen, UA: 0.2 E.U./dL
pH, UA: 5.5 (ref 5.0–8.0)

## 2021-04-08 MED ORDER — MELOXICAM 7.5 MG PO TABS
7.5000 mg | ORAL_TABLET | Freq: Every day | ORAL | 0 refills | Status: DC
  Filled 2021-04-08: qty 30, 30d supply, fill #0

## 2021-04-08 MED ORDER — CYCLOBENZAPRINE HCL 5 MG PO TABS
5.0000 mg | ORAL_TABLET | Freq: Every evening | ORAL | 0 refills | Status: DC | PRN
  Filled 2021-04-08: qty 30, 30d supply, fill #0

## 2021-04-08 MED FILL — Pantoprazole Sodium EC Tab 40 MG (Base Equiv): ORAL | 30 days supply | Qty: 30 | Fill #4 | Status: CN

## 2021-04-08 MED FILL — Pantoprazole Sodium EC Tab 40 MG (Base Equiv): ORAL | 30 days supply | Qty: 30 | Fill #0 | Status: AC

## 2021-04-08 NOTE — Progress Notes (Signed)
Urinalysis discussed in office.

## 2021-04-08 NOTE — Progress Notes (Signed)
Back pain X 3 days

## 2021-04-08 NOTE — Progress Notes (Signed)
Patient ID: Sherri [name], female    DOB: 1985-05-10  MRN: [id number]  CC: Back Pain  Subjective: Sherri [name] is a 36 y.o. female who presents for back pain.   Her concerns today include:   Back pain for 3 days. Denies recent trauma/injury, heavy lifting, pushing, pulling, or additional red flag symptoms. Described as squeezing pain. Tried over-the-counter Ibuprofen without much relief. Requesting STD screening. Has appointment March 2023 with Gynecology for recurrent BV.  Patient Active Problem List   Diagnosis Date Noted   Prediabetes 07/05/2020   Gastroesophageal reflux disease 04/26/2020   Perianal rash 03/26/2020   Chronic nausea 03/26/2020   Chronic constipation 03/09/2019   Pelvic pain in female 03/09/2019   Hypertension    Genital herpes    Uterine fibroid 12/25/2013   Obesity 12/25/2013   Bacterial vaginitis 04/27/2012     Current Outpatient Medications on File Prior to Visit  Medication Sig Dispense Refill   acetaminophen (TYLENOL) 500 MG tablet TAKE 2 TABLETS (1,000 MG TOTAL) BY MOUTH EVERY 6 (SIX) HOURS AS NEEDED FOR MODERATE PAIN, FEVER OR HEADACHE. 30 tablet 0   azelastine (ASTELIN) 0.1 % nasal spray Place 2 sprays into both nostrils 2 (two) times daily. 30 mL 0   cetirizine (ZYRTEC ALLERGY) 10 MG tablet Take 1 tablet (10 mg total) by mouth daily. 30 tablet 11   Cholecalciferol (VITAMIN D3) 125 MCG (5000 UT) CAPS Take 1 capsule by mouth daily.     Ferrous Sulfate (IRON) 28 MG TABS Take 1 tablet by mouth daily.     fluticasone (FLONASE) 50 MCG/ACT nasal spray Place 2 sprays into both nostrils daily. 16 g 0   glycopyrrolate (ROBINUL) 1 MG tablet Take 1 tablet (1 mg total) by mouth 2 (two) times daily. 60 tablet 11   Hyoscyamine Sulfate SL (LEVSIN/SL) 0.125 MG SUBL Place 0.125 mg under the tongue 2 (two) times daily as needed. 30 tablet 0   ibuprofen (ADVIL) 600 MG tablet Take 1 tablet (600 mg total) by mouth every 8 (eight) hours as needed. 30 tablet 0    mirtazapine (REMERON) 15 MG tablet TAKE 2 TABLETS (30 MG TOTAL) BY MOUTH AT BEDTIME. 30 tablet 1   ondansetron (ZOFRAN-ODT) 4 MG disintegrating tablet Dissolve 1 tablet (4 mg total) by mouth every 8 (eight) hours as needed for nausea or vomiting. 20 tablet 2   pantoprazole (PROTONIX) 40 MG tablet TAKE 1 TABLET (40 MG TOTAL) BY MOUTH DAILY. 30 tablet 5   pseudoephedrine (SUDAFED) 60 MG tablet Take 1 tablet (60 mg total) by mouth every 8 (eight) hours as needed for congestion. 30 tablet 0   amitriptyline (ELAVIL) 25 MG tablet Take 1 tablet (25 mg total) by mouth at bedtime. 30 tablet 2   Current Facility-Administered Medications on File Prior to Visit  Medication Dose Route Frequency Provider Last Rate Last Admin   methylPREDNISolone sodium succinate (SOLU-MEDROL) 40 mg/mL injection 40 mg  40 mg Intramuscular Once Camillia Herter, NP        Allergies  Allergen Reactions   Macrobid [Nitrofurantoin] Nausea And Vomiting   Augmentin [Amoxicillin-Pot Clavulanate]     Pt states it makes her sick    Social History   Socioeconomic History   Marital status: Single    Spouse name: Not on file   Number of children: Not on file   Years of education: Not on file   Highest education level: Not on file  Occupational History   Occupation: [employer]  Tobacco Use  Smoking status: Never   Smokeless tobacco: Never  Vaping Use   Vaping Use: Never used  Substance and Sexual Activity   Alcohol use: No   Drug use: No   Sexual activity: Yes    Birth control/protection: None  Other Topics Concern   Not on file  Social History Narrative   Not on file   Social Determinants of Health   Financial Resource Strain: Not on file  Food Insecurity: Not on file  Transportation Needs: Not on file  Physical Activity: Not on file  Stress: Not on file  Social Connections: Not on file  Intimate Partner Violence: Not on file    Family History  Problem Relation Age of Onset   Hypertension Mother     Hypertension Father    Breast cancer Maternal Grandmother        50   Colon cancer Neg Hx    Colon polyps Neg Hx    Kidney disease Neg Hx    Diabetes Neg Hx    Esophageal cancer Neg Hx    Gallbladder disease Neg Hx    Heart disease Neg Hx    Asthma Neg Hx    Stroke Neg Hx    Stomach cancer Neg Hx     Past Surgical History:  Procedure Laterality Date   CESAREAN SECTION N/A 06/05/2014   Procedure: CESAREAN SECTION;  Surgeon: Truett Mainland, DO;  Location: [facility];  Service: Obstetrics;  Laterality: N/A;   WISDOM TOOTH EXTRACTION      ROS: Review of Systems Negative except as stated above  PHYSICAL EXAM: BP (!) 149/83    Pulse 80    Resp 18    Ht 4' 10.5" (1.486 m)    Wt 199 lb 2 oz (90.3 kg)    LMP 03/22/2021    SpO2 97%    BMI 40.91 kg/m   Physical Exam HENT:     Head: Normocephalic and atraumatic.  Eyes:     Extraocular Movements: Extraocular movements intact.     Conjunctiva/sclera: Conjunctivae normal.     Pupils: Pupils are equal, round, and reactive to light.  Cardiovascular:     Rate and Rhythm: Normal rate and regular rhythm.     Pulses: Normal pulses.     Heart sounds: Normal heart sounds.    No friction rub.  Pulmonary:     Effort: Pulmonary effort is normal.     Breath sounds: Normal breath sounds.  Musculoskeletal:     Cervical back: Normal range of motion and neck supple.  Neurological:     General: No focal deficit present.     Mental Status: She is alert and oriented to person, place, and time.  Psychiatric:        Mood and Affect: Mood normal.        Behavior: Behavior normal.    ASSESSMENT AND PLAN: 1. Acute bilateral low back pain, unspecified whether sciatica present: - Meloxicam and Cyclobenzaprine as prescribed. Counseled on medication compliance and adverse effects.  - Follow-up with primary provider in 4 weeks or sooner if needed.  - meloxicam (MOBIC) 7.5 MG tablet; Take 1 tablet (7.5 mg total) by mouth daily.  Dispense: 30 tablet; Refill:  0 - cyclobenzaprine (FLEXERIL) 5 MG tablet; Take 1 tablet (5 mg total) by mouth at bedtime as needed for muscle spasms.  Dispense: 30 tablet; Refill: 0  2. Routine screening for STI (sexually transmitted infection): - No urinary tract infection.  - Routine STI screening.  - POCT  URINALYSIS DIP (CLINITEK) - Cervicovaginal ancillary only  3. BV (bacterial vaginosis): - Recurrent bacterial vaginosis.  - Keep appointment scheduled 05/09/2021 with Lynnda Shields, MD at Gynecology.   Patient was given the opportunity to ask questions.  Patient verbalized understanding of the plan and was able to repeat key elements of the plan. Patient was given clear instructions to go to Emergency Department or return to medical center if symptoms don't improve, worsen, or new problems develop.The patient verbalized understanding.   Orders Placed This Encounter  Procedures   POCT URINALYSIS DIP (CLINITEK)     Requested Prescriptions   Signed Prescriptions Disp Refills   meloxicam (MOBIC) 7.5 MG tablet 30 tablet 0    Sig: Take 1 tablet (7.5 mg total) by mouth daily.   cyclobenzaprine (FLEXERIL) 5 MG tablet 30 tablet 0    Sig: Take 1 tablet (5 mg total) by mouth at bedtime as needed for muscle spasms.    Follow-up with primary provider as scheduled.  Camillia Herter, NP

## 2021-04-09 ENCOUNTER — Telehealth: Payer: Self-pay

## 2021-04-09 ENCOUNTER — Other Ambulatory Visit: Payer: Self-pay

## 2021-04-09 ENCOUNTER — Other Ambulatory Visit: Payer: Self-pay | Admitting: Family

## 2021-04-09 DIAGNOSIS — B3731 Acute candidiasis of vulva and vagina: Secondary | ICD-10-CM

## 2021-04-09 DIAGNOSIS — F419 Anxiety disorder, unspecified: Secondary | ICD-10-CM

## 2021-04-09 DIAGNOSIS — B9689 Other specified bacterial agents as the cause of diseases classified elsewhere: Secondary | ICD-10-CM

## 2021-04-09 LAB — CERVICOVAGINAL ANCILLARY ONLY
Bacterial Vaginitis (gardnerella): POSITIVE — AB
Candida Glabrata: NEGATIVE
Candida Vaginitis: POSITIVE — AB
Chlamydia: NEGATIVE
Comment: NEGATIVE
Comment: NEGATIVE
Comment: NEGATIVE
Comment: NEGATIVE
Comment: NEGATIVE
Comment: NORMAL
Neisseria Gonorrhea: NEGATIVE
Trichomonas: NEGATIVE

## 2021-04-09 MED ORDER — METRONIDAZOLE 0.75 % VA GEL
1.0000 | Freq: Every day | VAGINAL | 0 refills | Status: AC
Start: 2021-04-09 — End: 2021-04-19
  Filled 2021-04-09: qty 70, 5d supply, fill #0

## 2021-04-09 MED ORDER — FLUCONAZOLE 150 MG PO TABS
150.0000 mg | ORAL_TABLET | Freq: Once | ORAL | 0 refills | Status: AC
  Filled 2021-04-09: qty 1, 1d supply, fill #0

## 2021-04-09 MED ORDER — AMITRIPTYLINE HCL 25 MG PO TABS
25.0000 mg | ORAL_TABLET | Freq: Every day | ORAL | 2 refills | Status: DC
  Filled 2021-04-09: qty 30, 30d supply, fill #0

## 2021-04-09 NOTE — Telephone Encounter (Signed)
-----   Message from Camillia Herter, NP sent at 04/09/2021  4:34 PM EST ----- The following abnormalities are noted:   - Bacterial Vaginitis - Candida Vaginitis  All other values are normal, stable or within acceptable limits.  Medication changes / Follow up labs / Other changes or recommendations:   - Metrogel Vaginal - Fluconazole   Amy Zachery Dauer, NP 04/09/2021 4:32 PM

## 2021-04-09 NOTE — Telephone Encounter (Signed)
Called pt to inform her of lab results but vm is not set up.

## 2021-04-09 NOTE — Progress Notes (Signed)
Noted  

## 2021-04-09 NOTE — Telephone Encounter (Signed)
Amitriptyline refilled per patient request. Schedule appointment for additional refills.

## 2021-04-09 NOTE — Progress Notes (Signed)
The following abnormalities are noted:   - Bacterial Vaginitis - Candida Vaginitis  All other values are normal, stable or within acceptable limits.  Medication changes / Follow up labs / Other changes or recommendations:   - Metrogel Vaginal - Fluconazole   Sherri Vitelli Zachery Dauer, NP 04/09/2021 4:32 PM

## 2021-04-10 ENCOUNTER — Other Ambulatory Visit: Payer: Self-pay

## 2021-04-11 ENCOUNTER — Other Ambulatory Visit: Payer: Self-pay

## 2021-04-14 ENCOUNTER — Other Ambulatory Visit: Payer: Self-pay

## 2021-04-17 NOTE — Progress Notes (Signed)
Patient ID: Sherri [name], female    DOB: [birth year]  MRN: [id number]  CC: Back Pain Follow-Up   Subjective: Sherri [name] is a 36 y.o. female who presents for back pain follow-up.  Her concerns today include:  Back pain persisting since last appointment. Meloxicam not helping.   Has a new spot on skin, right upper arm. Itches sometimes. Noticed 2 days ago. She does change clothes detergent sometimes, from Gain to generic store brand.   Still having allergy issues. Cetirizine not helping much.   Still having abdominal pain. Managed by Gastroenterology. Plans to follow-up soon.   Still having vaginal burning and itching. Appointment with Gynecology March 2023. Planning to call to see if she can get sooner appointment.  Patient Active Problem List   Diagnosis Date Noted   Prediabetes 07/05/2020   Gastroesophageal reflux disease 04/26/2020   Perianal rash 03/26/2020   Chronic nausea 03/26/2020   Chronic constipation 03/09/2019   Pelvic pain in female 03/09/2019   Hypertension    Genital herpes    Uterine fibroid 12/25/2013   Obesity 12/25/2013   Bacterial vaginitis 04/27/2012     Current Outpatient Medications on File Prior to Visit  Medication Sig Dispense Refill   acetaminophen (TYLENOL) 500 MG tablet TAKE 2 TABLETS (1,000 MG TOTAL) BY MOUTH EVERY 6 (SIX) HOURS AS NEEDED FOR MODERATE PAIN, FEVER OR HEADACHE. 30 tablet 0   amitriptyline (ELAVIL) 25 MG tablet Take 1 tablet (25 mg total) by mouth at bedtime. 30 tablet 2   azelastine (ASTELIN) 0.1 % nasal spray Place 2 sprays into both nostrils 2 (two) times daily. 30 mL 0   cetirizine (ZYRTEC ALLERGY) 10 MG tablet Take 1 tablet (10 mg total) by mouth daily. 30 tablet 11   Cholecalciferol (VITAMIN D3) 125 MCG (5000 UT) CAPS Take 1 capsule by mouth daily.     cyclobenzaprine (FLEXERIL) 5 MG tablet Take 1 tablet (5 mg total) by mouth at bedtime as needed for muscle spasms. 30 tablet 0   Ferrous Sulfate (IRON) 28 MG TABS Take  1 tablet by mouth daily.     fluticasone (FLONASE) 50 MCG/ACT nasal spray Place 2 sprays into both nostrils daily. 16 g 0   glycopyrrolate (ROBINUL) 1 MG tablet Take 1 tablet (1 mg total) by mouth 2 (two) times daily. 60 tablet 11   Hyoscyamine Sulfate SL (LEVSIN/SL) 0.125 MG SUBL Place 0.125 mg under the tongue 2 (two) times daily as needed. 30 tablet 0   ibuprofen (ADVIL) 600 MG tablet Take 1 tablet (600 mg total) by mouth every 8 (eight) hours as needed. 30 tablet 0   meloxicam (MOBIC) 7.5 MG tablet Take 1 tablet (7.5 mg total) by mouth daily. 30 tablet 0   mirtazapine (REMERON) 15 MG tablet TAKE 2 TABLETS (30 MG TOTAL) BY MOUTH AT BEDTIME. 30 tablet 1   ondansetron (ZOFRAN-ODT) 4 MG disintegrating tablet Dissolve 1 tablet (4 mg total) by mouth every 8 (eight) hours as needed for nausea or vomiting. 20 tablet 2   pantoprazole (PROTONIX) 40 MG tablet TAKE 1 TABLET (40 MG TOTAL) BY MOUTH DAILY. 30 tablet 5   pseudoephedrine (SUDAFED) 60 MG tablet Take 1 tablet (60 mg total) by mouth every 8 (eight) hours as needed for congestion. 30 tablet 0   Current Facility-Administered Medications on File Prior to Visit  Medication Dose Route Frequency Provider Last Rate Last Admin   methylPREDNISolone sodium succinate (SOLU-MEDROL) 40 mg/mL injection 40 mg  40 mg Intramuscular Once [place], [place]  J, NP        Allergies  Allergen Reactions   Macrobid [Nitrofurantoin] Nausea And Vomiting   Augmentin [Amoxicillin-Pot Clavulanate]     Pt states it makes her sick    Social History   Socioeconomic History   Marital status: Single    Spouse name: Not on file   Number of children: Not on file   Years of education: Not on file   Highest education level: Not on file  Occupational History   Occupation: [employer]  Tobacco Use   Smoking status: Never   Smokeless tobacco: Never  Vaping Use   Vaping Use: Never used  Substance and Sexual Activity   Alcohol use: No   Drug use: No   Sexual activity: Yes     Birth control/protection: None  Other Topics Concern   Not on file  Social History Narrative   Not on file   Social Determinants of Health   Financial Resource Strain: Not on file  Food Insecurity: Not on file  Transportation Needs: Not on file  Physical Activity: Not on file  Stress: Not on file  Social Connections: Not on file  Intimate Partner Violence: Not on file    Family History  Problem Relation Age of Onset   Hypertension Mother    Hypertension Father    Breast cancer Maternal Grandmother        18   Colon cancer Neg Hx    Colon polyps Neg Hx    Kidney disease Neg Hx    Diabetes Neg Hx    Esophageal cancer Neg Hx    Gallbladder disease Neg Hx    Heart disease Neg Hx    Asthma Neg Hx    Stroke Neg Hx    Stomach cancer Neg Hx     Past Surgical History:  Procedure Laterality Date   CESAREAN SECTION N/A 06/05/2014   Procedure: CESAREAN SECTION;  Surgeon: Truett Mainland, DO;  Location: [facility];  Service: Obstetrics;  Laterality: N/A;   WISDOM TOOTH EXTRACTION      ROS: Review of Systems Negative except as stated above  PHYSICAL EXAM: BP 138/86 (BP Location: Left Arm, Patient Position: Sitting, Cuff Size: Large)    Pulse 86    Temp 98.5 F (36.9 C)    Resp 18    Ht 4' 10.5" (1.486 m)    Wt 201 lb (91.2 kg)    SpO2 97%    BMI 41.29 kg/m   Physical Exam HENT:     Head: Normocephalic and atraumatic.  Eyes:     Extraocular Movements: Extraocular movements intact.     Conjunctiva/sclera: Conjunctivae normal.     Pupils: Pupils are equal, round, and reactive to light.  Cardiovascular:     Rate and Rhythm: Normal rate and regular rhythm.     Pulses: Normal pulses.     Heart sounds: Normal heart sounds.  Pulmonary:     Effort: Pulmonary effort is normal.     Breath sounds: Normal breath sounds.  Musculoskeletal:     Cervical back: Normal range of motion and neck supple.  Skin:    Findings: Rash present.     Comments: Right upper extremity with  hypopigmented area with two hyperpigmented pinpoint spots.  Neurological:     General: No focal deficit present.     Mental Status: She is alert and oriented to person, place, and time.  Psychiatric:        Mood and Affect: Mood normal.  Behavior: Behavior normal.   ASSESSMENT AND PLAN: 1. Acute bilateral low back pain, unspecified whether sciatica present: - Referral to Orthopedic Surgery for further evaluation and management.  - Ambulatory referral to Orthopedic Surgery  2. Rash and nonspecific skin eruption: - Referral to Dermatology for further evaluation and management.  - Ambulatory referral to Dermatology  3. Perennial allergic rhinitis: - Methylprednisolone injection today in office.  - Referral to Allergy for further evaluation and management.  - methylPREDNISolone sodium succinate (SOLU-MEDROL) 40 mg/mL injection 40 mg - Ambulatory referral to Allergy  4. Generalized abdominal pain: - Keep all scheduled appointments with Gastroenterology.   5. BV (bacterial vaginosis): - Keep all scheduled appointments with Gynecology.    Patient was given the opportunity to ask questions.  Patient verbalized understanding of the plan and was able to repeat key elements of the plan. Patient was given clear instructions to go to Emergency Department or return to medical center if symptoms don't improve, worsen, or new problems develop.The patient verbalized understanding.   Orders Placed This Encounter  Procedures   Ambulatory referral to Orthopedic Surgery   Ambulatory referral to Dermatology   Ambulatory referral to Allergy   Follow-up with primary provider as scheduled.   Camillia Herter, NP

## 2021-04-21 ENCOUNTER — Other Ambulatory Visit (HOSPITAL_COMMUNITY): Payer: Self-pay

## 2021-04-21 ENCOUNTER — Encounter: Payer: Self-pay | Admitting: Obstetrics and Gynecology

## 2021-04-22 ENCOUNTER — Encounter: Payer: Self-pay | Admitting: Family

## 2021-04-22 ENCOUNTER — Other Ambulatory Visit (HOSPITAL_COMMUNITY): Payer: Self-pay

## 2021-04-22 ENCOUNTER — Other Ambulatory Visit: Payer: Self-pay

## 2021-04-22 ENCOUNTER — Ambulatory Visit (INDEPENDENT_AMBULATORY_CARE_PROVIDER_SITE_OTHER): Payer: Self-pay | Admitting: Family

## 2021-04-22 VITALS — BP 138/86 | HR 86 | Temp 98.5°F | Resp 18 | Ht 58.5 in | Wt 201.0 lb

## 2021-04-22 DIAGNOSIS — N760 Acute vaginitis: Secondary | ICD-10-CM

## 2021-04-22 DIAGNOSIS — R1084 Generalized abdominal pain: Secondary | ICD-10-CM

## 2021-04-22 DIAGNOSIS — B9689 Other specified bacterial agents as the cause of diseases classified elsewhere: Secondary | ICD-10-CM

## 2021-04-22 DIAGNOSIS — M545 Low back pain, unspecified: Secondary | ICD-10-CM

## 2021-04-22 DIAGNOSIS — J3089 Other allergic rhinitis: Secondary | ICD-10-CM

## 2021-04-22 DIAGNOSIS — R21 Rash and other nonspecific skin eruption: Secondary | ICD-10-CM

## 2021-04-22 MED ORDER — METHYLPREDNISOLONE SODIUM SUCC 40 MG IJ SOLR
40.0000 mg | Freq: Once | INTRAMUSCULAR | Status: AC
  Administered 2021-04-22: 40 mg via INTRAMUSCULAR

## 2021-04-23 ENCOUNTER — Other Ambulatory Visit (HOSPITAL_COMMUNITY): Payer: Self-pay

## 2021-04-27 ENCOUNTER — Other Ambulatory Visit: Payer: Self-pay | Admitting: Family

## 2021-04-27 DIAGNOSIS — R110 Nausea: Secondary | ICD-10-CM

## 2021-04-29 MED ORDER — ONDANSETRON 4 MG PO TBDP
4.0000 mg | ORAL_TABLET | Freq: Three times a day (TID) | ORAL | 0 refills | Status: DC | PRN
  Filled 2021-04-29: qty 30, 10d supply, fill #0

## 2021-04-29 NOTE — Telephone Encounter (Signed)
Ondansetron refilled as requested. Keep all scheduled appointments with Gastroenterology.

## 2021-04-30 ENCOUNTER — Other Ambulatory Visit (HOSPITAL_COMMUNITY): Payer: Self-pay

## 2021-05-05 ENCOUNTER — Ambulatory Visit: Payer: Self-pay | Admitting: Physician Assistant

## 2021-05-07 ENCOUNTER — Other Ambulatory Visit (HOSPITAL_COMMUNITY): Payer: Self-pay

## 2021-05-07 ENCOUNTER — Ambulatory Visit: Payer: Self-pay | Admitting: Nurse Practitioner

## 2021-05-09 ENCOUNTER — Encounter: Payer: Self-pay | Admitting: Obstetrics and Gynecology

## 2021-05-09 ENCOUNTER — Other Ambulatory Visit (HOSPITAL_COMMUNITY): Payer: Self-pay

## 2021-05-22 ENCOUNTER — Other Ambulatory Visit: Payer: Self-pay

## 2021-05-22 ENCOUNTER — Other Ambulatory Visit: Payer: Self-pay | Admitting: Family

## 2021-05-22 DIAGNOSIS — R110 Nausea: Secondary | ICD-10-CM

## 2021-05-22 MED ORDER — ONDANSETRON 4 MG PO TBDP
4.0000 mg | ORAL_TABLET | Freq: Three times a day (TID) | ORAL | 0 refills | Status: DC | PRN
  Filled 2021-05-22 – 2021-06-06 (×2): qty 30, 10d supply, fill #0

## 2021-05-23 ENCOUNTER — Other Ambulatory Visit: Payer: Self-pay

## 2021-05-26 ENCOUNTER — Other Ambulatory Visit: Payer: Self-pay

## 2021-05-26 ENCOUNTER — Other Ambulatory Visit (HOSPITAL_COMMUNITY): Payer: Self-pay

## 2021-05-30 ENCOUNTER — Other Ambulatory Visit: Payer: Self-pay

## 2021-06-02 ENCOUNTER — Ambulatory Visit: Payer: Self-pay | Admitting: Physician Assistant

## 2021-06-06 ENCOUNTER — Other Ambulatory Visit (HOSPITAL_COMMUNITY): Payer: Self-pay

## 2021-06-09 ENCOUNTER — Telehealth: Payer: Self-pay | Admitting: Nurse Practitioner

## 2021-06-09 ENCOUNTER — Ambulatory Visit: Payer: Self-pay | Admitting: Nurse Practitioner

## 2021-06-09 NOTE — Telephone Encounter (Signed)
Good Morning Sherri [name], ? ? ?Patient called to cancel appointment with you this morning at 11:00 with no reason given.  ? ?Patient was rescheduled for 5/3 at 2:30. ?

## 2021-06-11 ENCOUNTER — Ambulatory Visit (HOSPITAL_COMMUNITY)
Admission: EM | Admit: 2021-06-11 | Discharge: 2021-06-11 | Disposition: A | Discharge status: Home / Self Care | Payer: Self-pay | Attending: Physician Assistant | Admitting: Physician Assistant

## 2021-06-11 ENCOUNTER — Other Ambulatory Visit: Payer: Self-pay

## 2021-06-11 ENCOUNTER — Encounter (HOSPITAL_COMMUNITY): Payer: Self-pay

## 2021-06-11 DIAGNOSIS — J0141 Acute recurrent pansinusitis: Secondary | ICD-10-CM

## 2021-06-11 MED ORDER — CEFDINIR 300 MG PO CAPS
300.0000 mg | ORAL_CAPSULE | Freq: Two times a day (BID) | ORAL | 0 refills | Status: DC
  Filled 2021-06-11: qty 18, 9d supply, fill #0

## 2021-06-11 MED ORDER — METHYLPREDNISOLONE ACETATE 80 MG/ML IJ SUSP
60.0000 mg | Freq: Once | INTRAMUSCULAR | Status: AC
  Administered 2021-06-11: 60 mg via INTRAMUSCULAR

## 2021-06-11 MED ORDER — METHYLPREDNISOLONE ACETATE 80 MG/ML IJ SUSP
INTRAMUSCULAR | Status: AC
  Filled 2021-06-11: qty 1

## 2021-06-11 NOTE — Discharge Instructions (Addendum)
We have given you injection of steroids to help with your symptoms today.  Please start [place] (also known as cefdinir) as this is an antibiotic to treat your infection.  You need to continue over-the-counter medication including Mucinex, antihistamine (Zyrtec) Tylenol, Flonase.  Make sure you are drinking plenty of fluid.  Given you have the symptoms frequently I do recommend you follow-up with an ENT.  Please call to schedule an appointment with them.  If anything worsens you develop fever, worsening cough, shortness of breath, nausea/vomiting, weakness you need to be seen immediately. ?

## 2021-06-11 NOTE — ED Triage Notes (Signed)
Pt presents to the office for body aches,sinus pressure and headache x 1 week. ?

## 2021-06-11 NOTE — ED Provider Notes (Signed)
?[facility] ? ? ? ?CSN: [id number] ?Arrival date & time: 06/11/21  0801 ? ? ?  ? ?History   ?Chief Complaint ?No chief complaint on file. ? ? ?HPI ?Sherri [name] is a 36 y.o. female.  ? ?Patient presents today with a week plus long history of URI symptoms.  She reports nasal congestion, sinus pressure, cough.  Denies any chest pain, shortness of breath, fever, vomiting.  She has tried over-the-counter medications including Tylenol and ibuprofen as well as Zyrtec and Flonase without improvement of symptoms.  She does have a history of allergies but states current symptoms are more severe than previous episodes of this condition.  She does have a history of recurrent sinusitis and states current symptoms are similar to previous episodes of this condition.  She has not seen ENT in the past but is open to referral.  She was last treated with antibiotics January 2023 with Augmentin.  She has had COVID in the past.  She is up-to-date on COVID vaccines.  She is confident she is not pregnant.  Denies any known sick contacts.  She denies any history of diabetes. ? ? ?Past Medical History:  ?Diagnosis Date  ? Fibromyalgia   ? denies today  ? Genital herpes   ? Hypertension   ? gestational  ? 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 Urinary tract infection   ? Uterine fibroid   ? ? ?Patient Active Problem List  ? Diagnosis Date Noted  ? Prediabetes 07/05/2020  ? Gastroesophageal reflux disease 04/26/2020  ? Perianal rash 03/26/2020  ? Chronic nausea 03/26/2020  ? Chronic constipation 03/09/2019  ? Pelvic pain in female 03/09/2019  ? Hypertension   ? Genital herpes   ? Uterine fibroid 12/25/2013  ? Obesity 12/25/2013  ? Bacterial vaginitis 04/27/2012  ? ? ?Past Surgical History:  ?Procedure Laterality Date  ? CESAREAN SECTION N/A 06/05/2014  ? Procedure: CESAREAN SECTION;  Surgeon: Truett Mainland, DO;  Location: [facility];  Service: Obstetrics;  Laterality: N/A;  ? WISDOM TOOTH EXTRACTION     ? ? ?OB History   ? ? Gravida  ?1  ? Para  ?1  ? Term  ?   ? Preterm  ?1  ? AB  ?   ? Living  ?2  ?  ? ? SAB  ?   ? IAB  ?   ? Ectopic  ?   ? Multiple  ?1  ? Live Births  ?2  ?   ?  ?  ? ? ? ?Home Medications   ? ?Prior to Admission medications   ?Medication Sig Start Date End Date Taking? Authorizing Provider  ?cefdinir (OMNICEF) 300 MG capsule Take 1 capsule (300 mg total) by mouth 2 (two) times daily. 06/11/21  Yes Matilyn Fehrman, Derry Skill, PA-C  ?amitriptyline (ELAVIL) 25 MG tablet Take 1 tablet (25 mg total) by mouth at bedtime. 04/09/21 07/08/21  Camillia Herter, NP  ?azelastine (ASTELIN) 0.1 % nasal spray Place 2 sprays into both nostrils 2 (two) times daily. 02/10/20   Tasia Catchings, Amy V, PA-C  ?cetirizine (ZYRTEC ALLERGY) 10 MG tablet Take 1 tablet (10 mg total) by mouth daily. 12/31/20   Camillia Herter, NP  ?Cholecalciferol (VITAMIN D3) 125 MCG (5000 UT) CAPS Take 1 capsule by mouth daily.    [provider]  ?cyclobenzaprine (FLEXERIL) 5 MG tablet Take 1 tablet (5 mg total) by mouth at bedtime as needed for muscle spasms. 04/08/21   Minette Brine,  Amy J, NP  ?Ferrous Sulfate (IRON) 28 MG TABS Take 1 tablet by mouth daily.    [provider]  ?fluticasone (FLONASE) 50 MCG/ACT nasal spray Place 2 sprays into both nostrils daily. 11/26/20   Camillia Herter, NP  ?glycopyrrolate (ROBINUL) 1 MG tablet Take 1 tablet (1 mg total) by mouth 2 (two) times daily. 02/03/21   Ladene Artist, MD  ?Hyoscyamine Sulfate SL (LEVSIN/SL) 0.125 MG SUBL Place 0.125 mg under the tongue 2 (two) times daily as needed. 12/25/20   Argil Mahl, Derry Skill, PA-C  ?ibuprofen (ADVIL) 600 MG tablet Take 1 tablet (600 mg total) by mouth every 8 (eight) hours as needed. 12/31/20   Camillia Herter, NP  ?meloxicam (MOBIC) 7.5 MG tablet Take 1 tablet (7.5 mg total) by mouth daily. 04/08/21   Camillia Herter, NP  ?mirtazapine (REMERON) 15 MG tablet TAKE 2 TABLETS (30 MG TOTAL) BY MOUTH AT BEDTIME. 04/26/20 04/26/21  Zehr, Janett Billow D, PA-C  ?ondansetron (ZOFRAN-ODT) 4 MG  disintegrating tablet Dissolve 1 tablet (4 mg total) by mouth every 8 (eight) hours as needed for nausea or vomiting. 05/22/21   Camillia Herter, NP  ?pantoprazole (PROTONIX) 40 MG tablet TAKE 1 TABLET (40 MG TOTAL) BY MOUTH DAILY. 04/26/20 05/09/21  Zehr, Laban Emperor, PA-C  ?pseudoephedrine (SUDAFED) 60 MG tablet Take 1 tablet (60 mg total) by mouth every 8 (eight) hours as needed for congestion. 11/29/20   Jaynee Eagles, PA-C  ? ? ?Family History ?Family History  ?Problem Relation Age of Onset  ? Hypertension Mother   ? Hypertension Father   ? Breast cancer Maternal Grandmother   ?     25  ? Colon cancer Neg Hx   ? Colon polyps Neg Hx   ? Kidney disease Neg Hx   ? Diabetes Neg Hx   ? Esophageal cancer Neg Hx   ? Gallbladder disease Neg Hx   ? Heart disease Neg Hx   ? Asthma Neg Hx   ? Stroke Neg Hx   ? Stomach cancer Neg Hx   ? ? ?Social History ?Social History  ? ?Tobacco Use  ? Smoking status: Never  ? Smokeless tobacco: Never  ?Vaping Use  ? Vaping Use: Never used  ?Substance Use Topics  ? Alcohol use: No  ? Drug use: No  ? ? ? ?Allergies   ?Macrobid [nitrofurantoin] and Augmentin [amoxicillin-pot clavulanate] ? ? ?Review of Systems ?Review of Systems  ?Constitutional:  Positive for activity change. Negative for appetite change, fatigue and fever.  ?HENT:  Positive for congestion, postnasal drip, sinus pressure, sinus pain and sore throat. Negative for sneezing.   ?Respiratory:  Positive for cough. Negative for shortness of breath.   ?Cardiovascular:  Negative for chest pain.  ?Gastrointestinal:  Positive for nausea. Negative for abdominal pain, diarrhea and vomiting.  ?Neurological:  Positive for headaches. Negative for dizziness and light-headedness.  ? ? ?Physical Exam ?Triage Vital Signs ?ED Triage Vitals [06/11/21 0822]  ?Enc Vitals Group  ?   BP 138/67  ?   Pulse Rate 67  ?   Resp 16  ?   Temp 98.2 ?F (36.8 ?C)  ?   Temp Source Oral  ?   SpO2 97 %  ?   Weight   ?   Height   ?   Head Circumference   ?   Peak Flow    ?   Pain Score   ?   Pain Loc   ?   Pain  Edu?   ?   Excl. in [place]?   ? ?No data found. ? ?Updated Vital Signs ?BP 138/67 (BP Location: Left Arm)   Pulse 67   Temp 98.2 ?F (36.8 ?C) (Oral)   Resp 16   LMP 06/09/2021 (Approximate)   SpO2 97%  ? ?Visual Acuity ?Right Eye Distance:   ?Left Eye Distance:   ?Bilateral Distance:   ? ?Right Eye Near:   ?Left Eye Near:    ?Bilateral Near:    ? ?Physical Exam ?Vitals reviewed.  ?Constitutional:   ?   General: She is awake. She is not in acute distress. ?   Appearance: Normal appearance. She is well-developed. She is not ill-appearing.  ?   Comments: Very pleasant female appears stated age in no acute distress sitting comfortably in exam room  ?HENT:  ?   Head: Normocephalic and atraumatic.  ?   Right Ear: Tympanic membrane, ear canal and external ear normal. Tympanic membrane is not erythematous or bulging.  ?   Left Ear: Tympanic membrane, ear canal and external ear normal. Tympanic membrane is not erythematous or bulging.  ?   Nose:  ?   Right Sinus: Maxillary sinus tenderness and frontal sinus tenderness present.  ?   Left Sinus: Maxillary sinus tenderness and frontal sinus tenderness present.  ?   Mouth/Throat:  ?   Pharynx: Uvula midline. No oropharyngeal exudate or posterior oropharyngeal erythema.  ?   Comments: Drainage present posterior oropharynx ?Cardiovascular:  ?   Rate and Rhythm: Normal rate and regular rhythm.  ?   Heart sounds: Normal heart sounds, S1 normal and S2 normal. No murmur heard. ?Pulmonary:  ?   Effort: Pulmonary effort is normal.  ?   Breath sounds: Normal breath sounds. No wheezing, rhonchi or rales.  ?   Comments: Clear to auscultation bilaterally ?Psychiatric:     ?   Behavior: Behavior is cooperative.  ? ? ? ?UC Treatments / Results  ?Labs ?(all labs ordered are listed, but only abnormal results are displayed) ?Labs Reviewed - No data to display ? ?EKG ? ? ?Radiology ?No results found. ? ?Procedures ?Procedures (including critical care  time) ? ?Medications Ordered in UC ?Medications  ?methylPREDNISolone acetate (DEPO-MEDROL) injection 60 mg (has no administration in time range)  ? ? ?Initial Impression / Assessment and Plan / UC Course  ?I have

## 2021-06-12 ENCOUNTER — Other Ambulatory Visit (HOSPITAL_COMMUNITY): Payer: Self-pay

## 2021-06-18 ENCOUNTER — Other Ambulatory Visit: Payer: Self-pay

## 2021-06-19 ENCOUNTER — Ambulatory Visit: Payer: Self-pay | Admitting: Allergy

## 2021-06-23 ENCOUNTER — Ambulatory Visit: Payer: Self-pay | Admitting: Physician Assistant

## 2021-06-24 ENCOUNTER — Encounter: Payer: Self-pay | Admitting: Physician Assistant

## 2021-06-24 ENCOUNTER — Other Ambulatory Visit: Payer: Self-pay

## 2021-06-24 ENCOUNTER — Ambulatory Visit (INDEPENDENT_AMBULATORY_CARE_PROVIDER_SITE_OTHER): Payer: Self-pay | Admitting: Physician Assistant

## 2021-06-24 VITALS — BP 138/79 | HR 92 | Temp 98.6°F | Resp 18 | Ht 59.0 in | Wt 197.0 lb

## 2021-06-24 DIAGNOSIS — F419 Anxiety disorder, unspecified: Secondary | ICD-10-CM

## 2021-06-24 DIAGNOSIS — M797 Fibromyalgia: Secondary | ICD-10-CM

## 2021-06-24 DIAGNOSIS — R7303 Prediabetes: Secondary | ICD-10-CM

## 2021-06-24 DIAGNOSIS — F32A Depression, unspecified: Secondary | ICD-10-CM

## 2021-06-24 LAB — POCT GLYCOSYLATED HEMOGLOBIN (HGB A1C): Hemoglobin A1C: 5.8 % — AB (ref 4.0–5.6)

## 2021-06-24 MED ORDER — AMITRIPTYLINE HCL 25 MG PO TABS
50.0000 mg | ORAL_TABLET | Freq: Every day | ORAL | 2 refills | Status: DC
  Filled 2021-06-24 – 2021-06-25 (×2): qty 60, 30d supply, fill #0
  Filled 2021-07-31 – 2022-01-14 (×2): qty 60, 30d supply, fill #1
  Filled 2022-02-19: qty 60, 30d supply, fill #2

## 2021-06-24 MED ORDER — METFORMIN HCL ER 500 MG PO TB24
500.0000 mg | ORAL_TABLET | Freq: Every day | ORAL | 1 refills | Status: DC
  Filled 2021-06-24 – 2021-06-25 (×2): qty 30, 30d supply, fill #0

## 2021-06-24 NOTE — Patient Instructions (Signed)
I encourage you to increase your amitriptyline to 50 mg at bedtime.  Make sure that your sleep environment is dark, cool, and quiet. ? ?You continue to have prediabetes, I do encourage you to follow a low sugar diet, increase your water intake, and start metformin on a daily basis with your breakfast. ? ?You will follow-up with the mobile team in 2 weeks for further evaluation. ? ?Kennieth Rad, PA-C ?Physician Assistant ?[facility] ?[email or web address] ? ? ?Prediabetes Eating Plan ?Prediabetes is a condition that causes blood sugar (glucose) levels to be higher than normal. This increases the risk for developing type 2 diabetes (type 2 diabetes mellitus). Working with a health care provider or nutrition specialist (dietitian) to make diet and lifestyle changes can help prevent the onset of diabetes. These changes may help you: ?Control your blood glucose levels. ?Improve your cholesterol levels. ?Manage your blood pressure. ?What are tips for following this plan? ?Reading food labels ?Read food labels to check the amount of fat, salt (sodium), and sugar in prepackaged foods. Avoid foods that have: ?Saturated fats. ?Trans fats. ?Added sugars. ?Avoid foods that have more than 300 milligrams (mg) of sodium per serving. Limit your sodium intake to less than 2,300 mg each day. ?Shopping ?Avoid buying pre-made and processed foods. ?Avoid buying drinks with added sugar. ?Cooking ?Cook with olive oil. Do not use butter, lard, or ghee. ?Bake, broil, grill, steam, or boil foods. Avoid frying. ?Meal planning ? ?Work with your dietitian to create an eating plan that is right for you. This may include tracking how many calories you take in each day. Use a food diary, notebook, or mobile application to track what you eat at each meal. ?Consider following a Mediterranean diet. This includes: ?Eating several servings of fresh fruits and vegetables each day. ?Eating fish at  least twice a week. ?Eating one serving each day of whole grains, beans, nuts, and seeds. ?Using olive oil instead of other fats. ?Limiting alcohol. ?Limiting red meat. ?Using nonfat or low-fat dairy products. ?Consider following a plant-based diet. This includes dietary choices that focus on eating mostly vegetables and fruit, grains, beans, nuts, and seeds. ?If you have high blood pressure, you may need to limit your sodium intake or follow a diet such as the DASH (Dietary Approaches to Stop Hypertension) eating plan. The DASH diet aims to lower high blood pressure. ?Lifestyle ?Set weight loss goals with help from your health care team. It is recommended that most people with prediabetes lose 7% of their body weight. ?Exercise for at least 30 minutes 5 or more days a week. ?Attend a support group or seek support from a mental health counselor. ?Take over-the-counter and prescription medicines only as told by your health care provider. ?What foods are recommended? ?Fruits ?Berries. Bananas. Apples. Oranges. Grapes. Papaya. Mango. Pomegranate. Kiwi. Grapefruit. Cherries. ?Vegetables ?Lettuce. Spinach. Peas. Beets. Cauliflower. Cabbage. Broccoli. Carrots. Tomatoes. Squash. Eggplant. Herbs. Peppers. Onions. Cucumbers. Brussels sprouts. ?Grains ?Whole grains, such as whole-wheat or whole-grain breads, crackers, cereals, and pasta. Unsweetened oatmeal. Bulgur. Barley. Quinoa. Brown rice. Corn or whole-wheat flour tortillas or taco shells. ?Meats and other proteins ?Seafood. Poultry without skin. Lean cuts of pork and beef. Tofu. Eggs. Nuts. Beans. ?Dairy ?Low-fat or fat-free dairy products, such as yogurt, cottage cheese, and cheese. ?Beverages ?Water. Tea. Coffee. Sugar-free or diet soda. Seltzer water. Low-fat or nonfat milk. Milk alternatives, such as soy or almond milk. ?Fats and oils ?Olive oil. Canola oil. Sunflower oil. Grapeseed oil. Avocado. Walnuts. ?Sweets  and desserts ?Sugar-free or low-fat pudding.  Sugar-free or low-fat ice cream and other frozen treats. ?Seasonings and condiments ?Herbs. Sodium-free spices. Mustard. Relish. Low-salt, low-sugar ketchup. Low-salt, low-sugar barbecue sauce. Low-fat or fat-free mayonnaise. ?The items listed above may not be a complete list of recommended foods and beverages. Contact a dietitian for more information. ?What foods are not recommended? ?Fruits ?Fruits canned with syrup. ?Vegetables ?Canned vegetables. Frozen vegetables with butter or cream sauce. ?Grains ?Refined white flour and flour products, such as bread, pasta, snack foods, and cereals. ?Meats and other proteins ?Fatty cuts of meat. Poultry with skin. Breaded or fried meat. Processed meats. ?Dairy ?Full-fat yogurt, cheese, or milk. ?Beverages ?Sweetened drinks, such as iced tea and soda. ?Fats and oils ?Butter. Lard. Ghee. ?Sweets and desserts ?Baked goods, such as cake, cupcakes, pastries, cookies, and cheesecake. ?Seasonings and condiments ?Spice mixes with added salt. Ketchup. Barbecue sauce. Mayonnaise. ?The items listed above may not be a complete list of foods and beverages that are not recommended. Contact a dietitian for more information. ?Where to find more information ?American Diabetes Association: www.diabetes.org ?Summary ?You may need to make diet and lifestyle changes to help prevent the onset of diabetes. These changes can help you control blood sugar, improve cholesterol levels, and manage blood pressure. ?Set weight loss goals with help from your health care team. It is recommended that most people with prediabetes lose 7% of their body weight. ?Consider following a Mediterranean diet. This includes eating plenty of fresh fruits and vegetables, whole grains, beans, nuts, seeds, fish, and low-fat dairy, and using olive oil instead of other fats. ?This information is not intended to replace advice given to you by your health care provider. Make sure you discuss any questions you have with your  health care provider. ?Document Revised: 05/18/2019 Document Reviewed: 05/18/2019 ?Elsevier Patient Education ? [place]. ? ?

## 2021-06-24 NOTE — Progress Notes (Signed)
Patient has eaten and taken medication today. ?Patient reports muscle relaxer and allergy mediation providing no relief. ?

## 2021-06-24 NOTE — Progress Notes (Signed)
? ?Established Patient Office Visit ? ?Subjective   ?Patient ID: Sherri [name], female    DOB: [birth year]  Age: 36 y.o. MRN: [id number] ? ?Chief Complaint  ?Patient presents with  ? Prediabetes  ? ? ?States that she does not feel the amitriptyline is helping her with her depressed moods.  States that she continues to have difficulty sleeping both falling asleep and staying asleep despite taking the amitriptyline and the Remeron.  States that she is sleeping approximately 5 to 6 hours a night.  States that her anxiety has been elevated as well. ? ?States that she drinks approximately 3 bottles of water a day, otherwise drinks [place] juice and orange juice.  We will have an occasional soda.   ? ? ?  06/24/2021  ?  8:32 AM 01/21/2021  ?  2:08 PM 01/15/2021  ?  3:46 PM 12/31/2020  ? 10:01 AM 11/26/2020  ?  4:21 PM  ?Depression screen PHQ 2/9  ?Decreased Interest 0 0 0 0 0  ?Down, Depressed, Hopeless 0 0 0 0 0  ?PHQ - 2 Score 0 0 0 0 0  ?Altered sleeping 0  0 0   ?Tired, decreased energy 3  0 0   ?Change in appetite 3  0 0   ?Feeling bad or failure about yourself  0  0 0   ?Trouble concentrating 0  0 0   ?Moving slowly or fidgety/restless 0  0 0   ?Suicidal thoughts 0  0 0   ?PHQ-9 Score 6  0 0   ?Difficult doing work/chores  Not difficult at all Not difficult at all    ? ? ?  06/24/2021  ?  8:32 AM 12/31/2020  ? 10:01 AM 07/03/2020  ?  2:19 PM 05/17/2020  ? 11:06 AM  ?GAD 7 : Generalized Anxiety Score  ?Nervous, Anxious, on Edge 0 0 1 0  ?Control/stop worrying 3 0 0 3  ?Worry too much - different things 3 0 1 3  ?Trouble relaxing 0 0 0 3  ?Restless 0 0 0 3  ?Easily annoyed or irritable 3 0 2 3  ?Afraid - awful might happen 0 0 0 0  ?Total GAD 7 Score 9 0 4 15  ? ? ? ? ?Past Medical History:  ?Diagnosis Date  ? Fibromyalgia   ? denies today  ? Genital herpes   ? Hypertension   ? gestational  ? 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 Urinary tract infection   ? Uterine fibroid   ? ?Social History   ? ?Socioeconomic History  ? Marital status: Single  ?  Spouse name: Not on file  ? Number of children: Not on file  ? Years of education: Not on file  ? Highest education level: Not on file  ?Occupational History  ? Occupation: [employer]  ?Tobacco Use  ? Smoking status: Never  ? Smokeless tobacco: Never  ?Vaping Use  ? Vaping Use: Never used  ?Substance and Sexual Activity  ? Alcohol use: No  ? Drug use: No  ? Sexual activity: Yes  ?  Birth control/protection: None  ?Other Topics Concern  ? Not on file  ?Social History Narrative  ? Not on file  ? ?Social Determinants of Health  ? ?Financial Resource Strain: Not on file  ?Food Insecurity: Not on file  ?Transportation Needs: Not on file  ?Physical Activity: Not on file  ?Stress: Not on file  ?Social Connections: Not on file  ?Intimate Partner  Violence: Not on file  ? ?Family History  ?Problem Relation Age of Onset  ? Hypertension Mother   ? Hypertension Father   ? Breast cancer Maternal Grandmother   ?     37  ? Colon cancer Neg Hx   ? Colon polyps Neg Hx   ? Kidney disease Neg Hx   ? Diabetes Neg Hx   ? Esophageal cancer Neg Hx   ? Gallbladder disease Neg Hx   ? Heart disease Neg Hx   ? Asthma Neg Hx   ? Stroke Neg Hx   ? Stomach cancer Neg Hx   ? ?Allergies  ?Allergen Reactions  ? Macrobid [Nitrofurantoin] Nausea And Vomiting  ? Augmentin [Amoxicillin-Pot Clavulanate]   ?  Pt states it makes her sick  ? ?  ? ?Review of Systems  ?Constitutional: Negative.   ?HENT: Negative.    ?Eyes: Negative.   ?Respiratory:  Negative for shortness of breath.   ?Cardiovascular:  Negative for chest pain.  ?Gastrointestinal: Negative.   ?Genitourinary: Negative.   ?Musculoskeletal: Negative.   ?Skin: Negative.   ?Neurological: Negative.   ?Endo/Heme/Allergies: Negative.   ?Psychiatric/Behavioral:  Positive for depression. Negative for suicidal ideas. The patient is nervous/anxious and has insomnia.   ? ?  ?Objective:  ?  ? ?BP 138/79 (BP Location: Left Arm, Patient Position: Sitting,  Cuff Size: Normal)   Pulse 92   Temp 98.6 ?F (37 ?C) (Oral)   Resp 18   Ht '4\' 11"'$  (1.499 m)   Wt 197 lb (89.4 kg)   LMP 06/09/2021 (Approximate)   SpO2 98%   BMI 39.79 kg/m?  ?Wt Readings from Last 3 Encounters:  ?06/24/21 197 lb (89.4 kg)  ?04/22/21 201 lb (91.2 kg)  ?04/08/21 199 lb 2 oz (90.3 kg)  ? ?  ? ?Physical Exam ?Vitals and nursing note reviewed.  ?Constitutional:   ?   Appearance: Normal appearance.  ?HENT:  ?   Head: Normocephalic and atraumatic.  ?   Right Ear: External ear normal.  ?   Left Ear: External ear normal.  ?   Nose: Nose normal.  ?   Mouth/Throat:  ?   Mouth: Mucous membranes are moist.  ?   Pharynx: Oropharynx is clear.  ?Eyes:  ?   Extraocular Movements: Extraocular movements intact.  ?   Conjunctiva/sclera: Conjunctivae normal.  ?   Pupils: Pupils are equal, round, and reactive to light.  ?Cardiovascular:  ?   Rate and Rhythm: Normal rate and regular rhythm.  ?   Pulses: Normal pulses.  ?   Heart sounds: Normal heart sounds.  ?Pulmonary:  ?   Effort: Pulmonary effort is normal.  ?   Breath sounds: Normal breath sounds.  ?Musculoskeletal:     ?   General: Normal range of motion.  ?   Cervical back: Normal range of motion and neck supple.  ?Skin: ?   General: Skin is warm and dry.  ?Neurological:  ?   General: No focal deficit present.  ?   Mental Status: She is alert and oriented to person, place, and time.  ?Psychiatric:     ?   Mood and Affect: Mood normal.     ?   Behavior: Behavior normal.     ?   Thought Content: Thought content normal.     ?   Judgment: Judgment normal.  ? ? ? ?Results for orders placed or performed in visit on 06/24/21  ?HgB A1c  ?Result Value Ref Range  ?  Hemoglobin A1C 5.8 (A) 4.0 - 5.6 %  ? HbA1c POC (<> result, manual entry)    ? HbA1c, POC (prediabetic range)    ? HbA1c, POC (controlled diabetic range)    ? ? ?  ?Assessment & Plan:  ? ?Problem List Items Addressed This Visit   ? ?  ? Other  ? Prediabetes - Primary  ? Relevant Medications  ? metFORMIN  (GLUCOPHAGE-XR) 500 MG 24 hr tablet  ? Other Relevant Orders  ? HgB A1c (Completed)  ? ?Other Visit Diagnoses   ? ? Anxiety and depression      ? Relevant Medications  ? amitriptyline (ELAVIL) 25 MG tablet  ? ?  ? ?1. Prediabetes ?A1c 5.8.  Patient has been prediabetic range for the past year.  Agreeable to trial metformin, patient encouraged to follow low sugar diet. ? ?Patient to follow-up with mobile team in 2 weeks for further evaluation ?- HgB A1c ?- metFORMIN (GLUCOPHAGE-XR) 500 MG 24 hr tablet; Take 1 tablet (500 mg total) by mouth daily with breakfast.  Dispense: 30 tablet; Refill: 1 ? ?2. Anxiety and depression ?Increase amitriptyline 50 mg, patient education given on good sleep hygiene, red flags for prompt reevaluation. ?- amitriptyline (ELAVIL) 25 MG tablet; Take 2 tablets (50 mg total) by mouth at bedtime.  Dispense: 60 tablet; Refill: 2 ? ?3. Fibromyalgia ? ? ? ?I have reviewed the patient's medical history (PMH, PSH, Social History, Family History, Medications, and allergies) , and have been updated if relevant. I spent 30 minutes reviewing chart and  face to face time with patient. ? ? ? ?Return in about 2 weeks (around 07/08/2021) for with MMU.  ? ? ?Delaine Canter S Mayers, PA-C ? ?

## 2021-06-25 ENCOUNTER — Other Ambulatory Visit: Payer: Self-pay

## 2021-06-25 ENCOUNTER — Other Ambulatory Visit (HOSPITAL_COMMUNITY): Payer: Self-pay

## 2021-06-30 ENCOUNTER — Other Ambulatory Visit (HOSPITAL_COMMUNITY): Payer: Self-pay

## 2021-06-30 ENCOUNTER — Ambulatory Visit: Payer: Self-pay | Admitting: Family

## 2021-07-02 ENCOUNTER — Other Ambulatory Visit: Payer: Self-pay

## 2021-07-02 ENCOUNTER — Telehealth: Payer: Self-pay | Admitting: Family

## 2021-07-02 ENCOUNTER — Other Ambulatory Visit (HOSPITAL_COMMUNITY): Payer: Self-pay

## 2021-07-02 ENCOUNTER — Ambulatory Visit (INDEPENDENT_AMBULATORY_CARE_PROVIDER_SITE_OTHER): Payer: Self-pay | Admitting: Nurse Practitioner

## 2021-07-02 ENCOUNTER — Encounter: Payer: Self-pay | Admitting: Nurse Practitioner

## 2021-07-02 VITALS — BP 130/74 | HR 83 | Ht 58.5 in | Wt 195.4 lb

## 2021-07-02 DIAGNOSIS — R110 Nausea: Secondary | ICD-10-CM

## 2021-07-02 DIAGNOSIS — R1084 Generalized abdominal pain: Secondary | ICD-10-CM

## 2021-07-02 DIAGNOSIS — K5909 Other constipation: Secondary | ICD-10-CM

## 2021-07-02 MED ORDER — TRIAMCINOLONE ACETONIDE 0.1 % EX CREA
TOPICAL_CREAM | CUTANEOUS | 0 refills | Status: DC
  Filled 2021-07-02: qty 80, 30d supply, fill #0
  Filled 2021-07-02: qty 80, 20d supply, fill #0

## 2021-07-02 MED ORDER — TRETINOIN 0.025 % EX CREA
TOPICAL_CREAM | CUTANEOUS | 0 refills | Status: DC
  Filled 2021-07-02 (×2): qty 20, 30d supply, fill #0

## 2021-07-02 MED ORDER — CLINDAMYCIN PHOSPHATE 1 % EX LOTN
TOPICAL_LOTION | CUTANEOUS | 1 refills | Status: DC
  Filled 2021-07-02 (×2): qty 60, 30d supply, fill #0
  Filled 2021-08-06: qty 60, 30d supply, fill #1

## 2021-07-02 MED ORDER — LINACLOTIDE 145 MCG PO CAPS
145.0000 ug | ORAL_CAPSULE | Freq: Every day | ORAL | 2 refills | Status: DC
  Filled 2021-07-02: qty 30, 30d supply, fill #0

## 2021-07-02 NOTE — Progress Notes (Signed)
? ? ?ASSESSMENT  ?Patient Profile:  ?Sherri [name] is a 36 y.o. female known to Dr. Fuller Plan  with a past medical history of GERD / hiatal hernia, chronic constipation, chronic abdominal pain, obesity, fibromyalgia, HTN.  Additional medical history as listed in [place] . ? ?Chronic abdominal pain / chronic constipation. Extensive abdominal imaging unrevealing. Her labs have been unremarkable. No improvement in constipation with miralax, Amitiza or Metamucil. Robinul didn't help abdominal pain  ? ? ?PLAN: ?Stop Robinul Forte since it doesn't help ?Trial of Linzess 145 mcg daily on empty stomach ?Call in a few weeks with a condition update.  ? ?History of present illness: ? ?Chief Complaint : abdominal pain, constipation ? ?Sherri [name] has been seen here several times by different providers for constipation, abdominal pain, nausea, upper abdominal pain. Over the years she has had a several imaging studies including plain abdominal films, CT scan, ultrasounds, barium enema and UGI series. Previously tried miralax, metamucil and amitiza without benefit. ? ? ?December 2022 office follow up ?She saw Dr Fuller Plan for lower abdominal pain and constipation. Given trial of Robinul Forte and advised to keep food diary. The lower abdominal pain has not improved. It is unrelated to bowel movements or eating. It does get worse with bending / twisting. She has fibromyalgia ( untreated). Several imaging studies have been unrevealing except for an US showing mild intrahepatic biliary ductal  dilatation. Slight prominence of the common hepatic duct to 7 mm.  Several sets of LFTs were normal.  ? ?Interval History:  ?She continues to have lower abdominal pain  and most of the time she is constipated with infrequent and hard stool. She has generalized body aches. Robinul Forte didn't help. She kept a food / beverage diary as he recommended but she didn't find any correlation with her symptoms.  No blood in stool. Weight is stable ? ?Previous GI  Evaluation  ? ?CTAP w/ contrast in 2015.  ?IMPRESSION:  ?1. Suggestion of mild wall thickening along the gallbladder; the  gallbladder is mildly distended. Mild intrahepatic biliary ductal  dilatation. Slight prominence of the common hepatic duct to 7 mm.  ?This could reflect distal obstruction. Would correlate with LFTs and  consider MRCP for further evaluation, as deemed clinically  ?appropriate.  ?2. Uterine fibroid noted.   ? ?2016 Abdominal US  ?-negative ? ?2018 Abd [place]  ?--normal.  ? ?2019 RUQ [place] ?--normal.  ? ?Oct 2021 UGI series ?IMPRESSION: ?1. Small sliding-type hiatal hernia with episodes of spontaneous GE reflux. ?2. Normal appearance of the stomach and duodenum. ? ?CTAP w/ contrast 2021 ?IMPRESSION: ?Normal CT scan of the abdomen and pelvis. No abnormality seen to ?explain right lower quadrant pain. Normal appendix. ? ?CTAP in 04/2020  ?Normal ? ?ACBE in 08/2020  ?unremarkable.  ? ?Abd [place] in 08/2020 ?--normal except for a borderline dilated common bile duct at 6-7 mm.   ? ?Laboratory data: ? ?  Latest Ref Rng & Units 01/21/2021  ?  3:25 PM 09/05/2020  ?  6:56 AM 09/04/2020  ? 10:21 AM  ?Hepatic Function  ?Total Protein 6.0 - 8.5 g/dL 7.3   7.9   8.0    ?Albumin 3.8 - 4.8 g/dL 4.6   4.5   4.4    ?AST 0 - 40 IU/L '12   15   19    ' ?ALT 0 - 32 IU/L '12   14   16    ' ?Alk Phosphatase 44 - 121 IU/L 97   64  73    ?Total Bilirubin 0.0 - 1.2 mg/dL <0.2   0.5   0.7    ? ? ? ?  Latest Ref Rng & Units 01/21/2021  ?  3:25 PM 09/05/2020  ?  6:56 AM 09/04/2020  ? 10:21 AM  ?CBC  ?WBC 3.4 - 10.8 x10E3/uL 8.0   5.1   6.2    ?Hemoglobin 11.1 - 15.9 g/dL 13.3   13.1   13.6    ?Hematocrit 34.0 - 46.6 % 40.3   40.6   42.7    ?Platelets 150 - 450 x10E3/uL 251   246   190    ? ? ? ?Past Medical History:  ?Diagnosis Date  ? Fibromyalgia   ? denies today  ? Genital herpes   ? Hypertension   ? gestational  ? 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 Urinary tract infection   ? Uterine fibroid   ? ? ?Past Surgical  History:  ?Procedure Laterality Date  ? CESAREAN SECTION N/A 06/05/2014  ? Procedure: CESAREAN SECTION;  Surgeon: Truett Mainland, DO;  Location: [facility];  Service: Obstetrics;  Laterality: N/A;  ? WISDOM TOOTH EXTRACTION    ? ? ?Current Medications, Allergies, Family History and Social History were reviewed in [employer] record. ?  ?  ?Current Outpatient Medications  ?Medication Sig Dispense Refill  ? amitriptyline (ELAVIL) 25 MG tablet Take 2 tablets (50 mg total) by mouth at bedtime. 60 tablet 2  ? azelastine (ASTELIN) 0.1 % nasal spray Place 2 sprays into both nostrils 2 (two) times daily. 30 mL 0  ? cefdinir (OMNICEF) 300 MG capsule Take 1 capsule (300 mg total) by mouth 2 (two) times daily. 20 capsule 0  ? cetirizine (ZYRTEC ALLERGY) 10 MG tablet Take 1 tablet (10 mg total) by mouth daily. 30 tablet 11  ? Cholecalciferol (VITAMIN D3) 125 MCG (5000 UT) CAPS Take 1 capsule by mouth daily.    ? clindamycin (CLEOCIN T) 1 % lotion Apply to cleansed face daily in the morning. 60 mL 1  ? cyclobenzaprine (FLEXERIL) 5 MG tablet Take 1 tablet (5 mg total) by mouth at bedtime as needed for muscle spasms. 30 tablet 0  ? Ferrous Sulfate (IRON) 28 MG TABS Take 1 tablet by mouth daily.    ? fluticasone (FLONASE) 50 MCG/ACT nasal spray Place 2 sprays into both nostrils daily. 16 g 0  ? glycopyrrolate (ROBINUL) 1 MG tablet Take 1 tablet (1 mg total) by mouth 2 (two) times daily. 60 tablet 11  ? Hyoscyamine Sulfate SL (LEVSIN/SL) 0.125 MG SUBL Place 0.125 mg under the tongue 2 (two) times daily as needed. 30 tablet 0  ? ibuprofen (ADVIL) 600 MG tablet Take 1 tablet (600 mg total) by mouth every 8 (eight) hours as needed. 30 tablet 0  ? meloxicam (MOBIC) 7.5 MG tablet Take 1 tablet (7.5 mg total) by mouth daily. 30 tablet 0  ? metFORMIN (GLUCOPHAGE-XR) 500 MG 24 hr tablet Take 1 tablet (500 mg total) by mouth daily with breakfast. 30 tablet 1  ? mirtazapine (REMERON) 15 MG tablet TAKE 2 TABLETS (30 MG TOTAL)  BY MOUTH AT BEDTIME. 30 tablet 1  ? ondansetron (ZOFRAN-ODT) 4 MG disintegrating tablet Dissolve 1 tablet (4 mg total) by mouth every 8 (eight) hours as needed for nausea or vomiting. 30 tablet 0  ? pantoprazole (PROTONIX) 40 MG tablet TAKE 1 TABLET (40 MG TOTAL) BY MOUTH DAILY. 30 tablet 5  ? tretinoin (RETIN-A)  0.025 % cream Apply pea size amount to cleansed face every other day at bedtime. Increase to every night as tolerated. 20 g 0  ? triamcinolone cream (KENALOG) 0.1 % Apply to affected areas twice daily for 3 days on and 3 days off cycle or as needed. 80 g 0  ? ?No current facility-administered medications for this visit.  ? ? ?Review of Systems: ?No chest pain. No shortness of breath. No urinary complaints.  ? ? ?Physical Exam ? ?Wt Readings from Last 3 Encounters:  ?07/02/21 195 lb 6 oz (88.6 kg)  ?06/24/21 197 lb (89.4 kg)  ?04/22/21 201 lb (91.2 kg)  ? ? ?BP 130/74   Pulse 83   Ht 4' 10.5" (1.486 m)   Wt 195 lb 6 oz (88.6 kg)   LMP 06/27/2021 (Approximate)   BMI 40.14 kg/m?  ?Constitutional:  Generally well appearing female in no acute distress. ?Psychiatric: Pleasant. Normal mood and affect. Behavior is normal. ?EENT: Pupils normal.  Conjunctivae are normal. No scleral icterus. ?Neck supple.  ?Cardiovascular: Normal rate, regular rhythm. No edema ?Pulmonary/chest: Effort normal and breath sounds normal. No wheezing, rales or rhonchi. ?Abdominal: Soft, nondistended, nontender. Bowel sounds active throughout. There are no masses palpable. No hepatomegaly. ?Neurological: Alert and oriented to person place and time. ?Skin: Skin is warm and dry. No rashes noted. ? ?Tye Savoy, NP  07/02/2021, 3:04 PM ? ?Cc:  ?Camillia Herter, NP ? ? ? ? ? ? ? ?

## 2021-07-02 NOTE — Telephone Encounter (Signed)
Appointment 07/02/2021 with Tye Savoy, NP at Gastroenterology. Request refills from the same.

## 2021-07-02 NOTE — Telephone Encounter (Signed)
Pt is calling back to follow up on medication refill. ? ?Pt stated she is completely out of medication. Not a new symptom.  ?

## 2021-07-02 NOTE — Telephone Encounter (Signed)
Please refill if appropriate to continue

## 2021-07-02 NOTE — Patient Instructions (Signed)
Stop the Robinul. ? ?PLEASE START NEW PRESCRIPTION MEDICATION(S):  ? ?We have sent the following medication(s) to your pharmacy: ? ?Linzess 145 MCG capsule. Take 1 every morning on an empty stomach. ? ?NOTE: If your medication(s) requires a PRIOR AUTHORIZATION, we will receive notification from your pharmacy. Once received, the process to submit for approval may take up to 7-10 business days. You will be contacted about any denials we have received from your insurance company as well as alternatives recommended by your provider. ? ?Please contact our office in a few weeks with an update. ? ? ?Thank you for trusting me with your gastrointestinal care!   ? ?Tye Savoy, NP ? ? ?BMI: ? ?If you are age 35 or older, your body mass index should be between 23-30. Your Body mass index is 40.14 kg/m?Marland Kitchen If this is out of the aforementioned range listed, please consider follow up with your Primary Care Provider. ? ?If you are age 42 or younger, your body mass index should be between 19-25. Your Body mass index is 40.14 kg/m?Marland Kitchen If this is out of the aformentioned range listed, please consider follow up with your Primary Care Provider.  ? ?MY CHART: ? ?The [facility] providers would like to encourage you to use [facility] to communicate with providers for non-urgent requests or questions.  Due to long hold times on the telephone, sending your provider a message by [facility] may be a faster and more efficient way to get a response.  Please allow 48 business hours for a response.  Please remember that this is for non-urgent requests.  ? ?

## 2021-07-03 ENCOUNTER — Other Ambulatory Visit: Payer: Self-pay

## 2021-07-03 ENCOUNTER — Other Ambulatory Visit (HOSPITAL_COMMUNITY): Payer: Self-pay

## 2021-07-03 ENCOUNTER — Other Ambulatory Visit: Payer: Self-pay | Admitting: Family

## 2021-07-03 DIAGNOSIS — R110 Nausea: Secondary | ICD-10-CM

## 2021-07-04 ENCOUNTER — Other Ambulatory Visit: Payer: Self-pay

## 2021-07-04 ENCOUNTER — Other Ambulatory Visit (HOSPITAL_COMMUNITY): Payer: Self-pay

## 2021-07-04 NOTE — Telephone Encounter (Signed)
Requested medication (s) are due for refill today: yes ? ?Requested medication (s) are on the active medication list: yes   ? ?Last refill: 05/22/21   #30  0 refills ? ?Future visit scheduled no ? ?Notes to clinic:Not delegated, please review. Thank you. ? ?Requested Prescriptions  ?Pending Prescriptions Disp Refills  ? ondansetron (ZOFRAN-ODT) 4 MG disintegrating tablet 30 tablet 0  ?  Sig: Dissolve 1 tablet (4 mg total) by mouth every 8 (eight) hours as needed for nausea or vomiting.  ?  ? Not Delegated - Gastroenterology: Antiemetics - ondansetron Failed - 07/03/2021  8:48 PM  ?  ?  Failed - This refill cannot be delegated  ?  ?  Passed - AST in normal range and within 360 days  ?  AST  ?Date Value Ref Range Status  ?01/21/2021 12 0 - 40 IU/L Final  ?  ?  ?  ?  Passed - ALT in normal range and within 360 days  ?  ALT  ?Date Value Ref Range Status  ?01/21/2021 12 0 - 32 IU/L Final  ?  ?  ?  ?  Passed - Valid encounter within last 6 months  ?  Recent Outpatient Visits   ? ?      ? 1 week ago Prediabetes  ? [facility] [place], PA-C  ? 2 months ago Acute bilateral low back pain, unspecified whether sciatica present  ? [facility] Care at [facility], Amy J, NP  ? 2 months ago Acute bilateral low back pain, unspecified whether sciatica present  ? Primary Care at [facility], Amy J, NP  ? 5 months ago Bacterial vaginitis  ? Primary Care at [facility], Amy J, NP  ? 5 months ago Fibromyalgia  ? Primary Care at [facility], [place], NP  ? ?  ?  ?Future Appointments   ? ?        ? In 5 months Lavonna Monarch, MD [facility], [place]  ? ?  ? ? ?  ?  ?  ? ? ? ? ?

## 2021-07-04 NOTE — Telephone Encounter (Signed)
Pt called to see why he request for Ondansetron was denied / please advise today if possible  ?

## 2021-07-05 ENCOUNTER — Other Ambulatory Visit (HOSPITAL_COMMUNITY): Payer: Self-pay

## 2021-07-07 ENCOUNTER — Telehealth: Payer: Self-pay | Admitting: Nurse Practitioner

## 2021-07-07 ENCOUNTER — Other Ambulatory Visit: Payer: Self-pay | Admitting: Family

## 2021-07-07 DIAGNOSIS — R110 Nausea: Secondary | ICD-10-CM

## 2021-07-07 NOTE — Telephone Encounter (Signed)
Please advise. Patient did not ask during 5/3 appointment and is calling PCP office.

## 2021-07-07 NOTE — Telephone Encounter (Signed)
Appointment 07/02/2021 with Tye Savoy, NP at Gastroenterology. Request refills from the same.

## 2021-07-07 NOTE — Telephone Encounter (Signed)
Inbound call from patient stating that since she has started back Linzess she has not been feeling well and her stomach is hurting. Patient is requesting a call back to discuss. Please advise.  ?

## 2021-07-07 NOTE — Telephone Encounter (Signed)
Spoke with pt and pt stated she started linzess on 5/5 and now has pain in her lower abd and green diarrhea 5x/day. Please advise.   ?

## 2021-07-07 NOTE — Telephone Encounter (Signed)
Medication Refill - Medication:  ?ondansetron (ZOFRAN-ODT) 4 MG disintegrating tablet  ? ?Has the patient contacted their pharmacy? Yes.   ?Contact PCP ? ?Preferred Pharmacy (with phone number or street name):  ?[facility]  ?[address], [place] [address]  ?Phone:  [phone]  Fax:  [phone]  ? ?Has the patient been seen for an appointment in the last year OR does the patient have an upcoming appointment? Yes.   ? ?Agent: Please be advised that RX refills may take up to 3 business days. We ask that you follow-up with your pharmacy. ?

## 2021-07-08 ENCOUNTER — Other Ambulatory Visit: Payer: Self-pay | Admitting: Family

## 2021-07-08 DIAGNOSIS — R110 Nausea: Secondary | ICD-10-CM

## 2021-07-08 NOTE — Telephone Encounter (Signed)
PT is calling back, called office not sure how to advise pt, GI refilling,?? told pt we would FU call at [phone]. ?

## 2021-07-08 NOTE — Telephone Encounter (Signed)
Appointment 07/02/2021 with Tye Savoy, NP at Gastroenterology. Request refills from the same.

## 2021-07-08 NOTE — Telephone Encounter (Signed)
Requested medication (s) are due for refill today: yes ? ?Requested medication (s) are on the active medication list: yes ? ?Last refill:  05/22/21 #30 ? ?Future visit scheduled: no ? ?Notes to clinic:  med not delegated to NT to RF ? ? ?Requested Prescriptions  ?Pending Prescriptions Disp Refills  ? ondansetron (ZOFRAN-ODT) 4 MG disintegrating tablet 30 tablet 0  ?  Sig: Dissolve 1 tablet (4 mg total) by mouth every 8 (eight) hours as needed for nausea or vomiting.  ?  ? Not Delegated - Gastroenterology: Antiemetics - ondansetron Failed - 07/07/2021 11:37 AM  ?  ?  Failed - This refill cannot be delegated  ?  ?  Passed - AST in normal range and within 360 days  ?  AST  ?Date Value Ref Range Status  ?01/21/2021 12 0 - 40 IU/L Final  ?  ?  ?  ?  Passed - ALT in normal range and within 360 days  ?  ALT  ?Date Value Ref Range Status  ?01/21/2021 12 0 - 32 IU/L Final  ?  ?  ?  ?  Passed - Valid encounter within last 6 months  ?  Recent Outpatient Visits   ? ?      ? 2 weeks ago Prediabetes  ? [facility] at [place], PA-C  ? 2 months ago Acute bilateral low back pain, unspecified whether sciatica present  ? Primary Care at [facility], Amy J, NP  ? 3 months ago Acute bilateral low back pain, unspecified whether sciatica present  ? Primary Care at [facility], Amy J, NP  ? 5 months ago Bacterial vaginitis  ? Primary Care at [facility], Amy J, NP  ? 5 months ago Fibromyalgia  ? Primary Care at [facility], [place], NP  ? ?  ?  ?Future Appointments   ? ?        ? In 4 months Lavonna Monarch, MD [facility], [place]  ? ?  ? ? ?  ?  ?  ? ? ? ? ?

## 2021-07-09 ENCOUNTER — Ambulatory Visit: Payer: Self-pay

## 2021-07-09 ENCOUNTER — Other Ambulatory Visit (HOSPITAL_COMMUNITY): Payer: Self-pay

## 2021-07-09 NOTE — Telephone Encounter (Signed)
Spoke with pt and gave pt recommendations. Pt verbalized understanding and requested a refill on zofran for nausea.  ?

## 2021-07-09 NOTE — Telephone Encounter (Signed)
?  Chief Complaint: Abdominal Pain ?Symptoms: diarrhea and pain ?Frequency: several days ?Pertinent Negatives: Patient denies fever, dizziness ?Disposition: '[]'$ ED /'[x]'$ Urgent Care (no appt availability in office) / '[]'$ Appointment(In office/virtual)/ '[]'$  [facility]/ '[]'$ Home Care/ '[]'$ Refused Recommended Disposition /'[]'$ [facility]/ '[]'$  Follow-up with PCP ?Additional Notes: Pt started a new medication for pre-diabetes. She is unsure which medication this is. She states that her abdominal pain started soon after starting the new medication. Pt is unsure if this is relate. She also states that she has changed her diet and wonders if the the healthier foods  - salads and fruit are causing this issue. Pt has pain of 8/10 and diarrhea. Pt states that pain increases after BM. ? ?Reason for Disposition ? [1] MILD-MODERATE pain AND [2] constant AND [3] present > 2 hours ? ?Answer Assessment - Initial Assessment Questions ?1. LOCATION: "Where does it hurt?"  ?    Bottom of stomach and lower back ?2. RADIATION: "Does the pain shoot anywhere else?" (e.g., chest, back) ?    no ?3. ONSET: "When did the pain begin?" (e.g., minutes, hours or days ago)  ?    Days ago ?4. SUDDEN: "Gradual or sudden onset?" ?    sudden ?5. PATTERN "Does the pain come and go, or is it constant?" ?   - If constant: "Is it getting better, staying the same, or worsening?"  ?    (Note: Constant means the pain never goes away completely; most serious pain is constant and it progresses)  ?   - If intermittent: "How long does it last?" "Do you have pain now?" ?    (Note: Intermittent means the pain goes away completely between bouts) ?    constant ?6. SEVERITY: "How bad is the pain?"  (e.g., Scale 1-10; mild, moderate, or severe) ?  - MILD (1-3): doesn't interfere with normal activities, abdomen soft and not tender to touch  ?  - MODERATE (4-7): interferes with normal activities or awakens from sleep, abdomen tender to touch  ?  - SEVERE (8-10):  excruciating pain, doubled over, unable to do any normal activities  ?    8/10 ?7. RECURRENT SYMPTOM: "Have you ever had this type of stomach pain before?" If Yes, ask: "When was the last time?" and "What happened that time?"  ?    no ?8. CAUSE: "What do you think is causing the stomach pain?" ?    New medication - or food ?9. RELIEVING/AGGRAVATING FACTORS: "What makes it better or worse?" (e.g., movement, antacids, bowel movement) ?    Hurts worse after BM ?10. OTHER SYMPTOMS: "Do you have any other symptoms?" (e.g., back pain, diarrhea, fever, urination pain, vomiting) ?      diarrhea ?11. PREGNANCY: "Is there any chance you are pregnant?" "When was your last menstrual period?" ?  no ? ?Protocols used: Abdominal Pain - Female-A-AH ? ?

## 2021-07-09 NOTE — Telephone Encounter (Signed)
Sherri Craze, NP   ? ?She can cut back Linzess to every other day. Thanks   ? ?

## 2021-07-10 ENCOUNTER — Ambulatory Visit (HOSPITAL_COMMUNITY)
Admission: EM | Admit: 2021-07-10 | Discharge: 2021-07-10 | Disposition: A | Discharge status: Home / Self Care | Payer: Self-pay

## 2021-07-10 ENCOUNTER — Ambulatory Visit: Payer: Self-pay | Admitting: *Deleted

## 2021-07-10 ENCOUNTER — Encounter: Payer: Self-pay | Admitting: Family Medicine

## 2021-07-10 ENCOUNTER — Encounter (HOSPITAL_COMMUNITY): Payer: Self-pay | Admitting: Emergency Medicine

## 2021-07-10 ENCOUNTER — Other Ambulatory Visit: Payer: Self-pay

## 2021-07-10 ENCOUNTER — Other Ambulatory Visit (HOSPITAL_COMMUNITY): Payer: Self-pay

## 2021-07-10 DIAGNOSIS — R110 Nausea: Secondary | ICD-10-CM

## 2021-07-10 DIAGNOSIS — T50905A Adverse effect of unspecified drugs, medicaments and biological substances, initial encounter: Secondary | ICD-10-CM

## 2021-07-10 DIAGNOSIS — R112 Nausea with vomiting, unspecified: Secondary | ICD-10-CM

## 2021-07-10 DIAGNOSIS — R197 Diarrhea, unspecified: Secondary | ICD-10-CM

## 2021-07-10 HISTORY — DX: Type 2 diabetes mellitus without complications: E11.9

## 2021-07-10 MED ORDER — ONDANSETRON 4 MG PO TBDP
4.0000 mg | ORAL_TABLET | Freq: Three times a day (TID) | ORAL | 1 refills | Status: DC | PRN
  Filled 2021-07-10: qty 30, 10d supply, fill #0
  Filled 2021-07-31 – 2021-08-25 (×2): qty 30, 10d supply, fill #1

## 2021-07-10 NOTE — ED Triage Notes (Signed)
Patient presents due to a adverse reaction to medication.  ? ?Patient states after taking metformin she started having ABD pain, weakness, diarrhea,and dry mouth.  ? ?Patient started taking metformin for 2 days. Patient states that her primary care doctor prescribed medication for DM.  ? ?Patients last dose was yesterday.  ?

## 2021-07-10 NOTE — Telephone Encounter (Signed)
Spoke w/pt and informed her to call her GI and request the medication refill. She stated that she could do that. ?

## 2021-07-10 NOTE — Telephone Encounter (Signed)
?  Chief Complaint: Not tolerating metformin.   Doctor at urgent care told her to contact PCP about changing it.   Pt. Has stopped taking it.   Went to urgent care yesterday  Not felt well since starting it.   BP was a little elevated too at urgent care. ?Symptoms: diarrhea and stomach pain since starting metformin a week ago ?Frequency: daily.    Stopped metformin, not taking it any more until hears back from Durene Fruits, NP ?Pertinent Negatives: Patient denies N/A ?Disposition: '[]'$ ED /'[]'$ Urgent Care (no appt availability in office) / '[]'$ Appointment(In office/virtual)/ '[]'$  [facility]/ '[]'$ Home Care/ '[]'$ Refused Recommended Disposition /'[]'$ [facility]/ '[x]'$  Follow-up with PCP ?Additional Notes: Message sent to Durene Fruits, NP.   Pt agreeable to someone calling her back with further instructions.  ?

## 2021-07-10 NOTE — Telephone Encounter (Signed)
Pt called because went to urgent care yesterday.   I'm to stop medication.   Metformin to be stopped.    I was having stomach pain and diarrhea with the metformin.   I've been on it a week.    ?Reason for Disposition ? [1] Caller has URGENT medicine question about med that PCP or specialist prescribed AND [2] triager unable to answer question ?   Metformin causing stomach pain and diarrhea. ? ?Answer Assessment - Initial Assessment Questions ?1. NAME of MEDICATION: "What medicine are you calling about?" ?    Metformin ?2. QUESTION: "What is your question?" (e.g., double dose of medicine, side effect) ?    I went to the urgent care yesterday because I was having stomach pains and diarrhea after starting metformin a week ago.   They told me to stop the metformin and tell my doctor that I needed something else. ?3. PRESCRIBING HCP: "Who prescribed it?" Reason: if prescribed by specialist, call should be referred to that group. ?    Durene Fruits, NP ?4. SYMPTOMS: "Do you have any symptoms?" ?    Diarrhea and stomach pains since starting it a week ago ?5. SEVERITY: If symptoms are present, ask "Are they mild, moderate or severe?" ?    moderate ?6. PREGNANCY:  "Is there any chance that you are pregnant?" "When was your last menstrual period?" ?    Not asked ? ?Protocols used: Medication Question Call-A-AH ? ?

## 2021-07-10 NOTE — ED Provider Notes (Signed)
?[facility] ? ? ? ?CSN: [id number] ?Arrival date & time: 07/10/21  0803 ? ? ?  ? ?History   ?Chief Complaint ?Chief Complaint  ?Patient presents with  ? Medication Reaction  ? ? ?HPI ?Sherri [name] is a 36 y.o. female.  ? ?Patient presents with abdominal pain, nausea, vomiting, diarrhea that started approximately 1 to 2 weeks ago.  Patient reports that it started subsequently after starting metformin for her prediabetes.  Patient was also started on Linzess by her GI doctor after metformin but reports that she is not taking this medication and the symptoms started after she started metformin.  Last dose was yesterday.  Pain is generalized and is described as a sharp and cramping sensation.  It increases when she has to have a bowel movement.  Denies blood in stool or emesis.  Patient reports multiple bowel movements a day that are diarrhea in nature.  Patient is still drinking fluids appropriately and able to keep them down.  Denies any associated fevers.  Patient reports that she has notified her primary care doctor about these side effects but she was advised to come to urgent care for further evaluation. ? ? ? ?Past Medical History:  ?Diagnosis Date  ? Diabetes mellitus without complication ([place])   ? Fibromyalgia   ? denies today  ? Genital herpes   ? Hypertension   ? gestational  ? 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 Urinary tract infection   ? Uterine fibroid   ? ? ?Patient Active Problem List  ? Diagnosis Date Noted  ? Anxiety and depression 06/24/2021  ? Fibromyalgia 06/24/2021  ? Prediabetes 07/05/2020  ? Gastroesophageal reflux disease 04/26/2020  ? Perianal rash 03/26/2020  ? Chronic nausea 03/26/2020  ? Chronic constipation 03/09/2019  ? Pelvic pain in female 03/09/2019  ? Hypertension   ? Genital herpes   ? Uterine fibroid 12/25/2013  ? Obesity 12/25/2013  ? Bacterial vaginitis 04/27/2012  ? ? ?Past Surgical History:  ?Procedure Laterality Date  ? CESAREAN SECTION  N/A 06/05/2014  ? Procedure: CESAREAN SECTION;  Surgeon: Truett Mainland, DO;  Location: [facility];  Service: Obstetrics;  Laterality: N/A;  ? WISDOM TOOTH EXTRACTION    ? ? ?OB History   ? ? Gravida  ?1  ? Para  ?1  ? Term  ?   ? Preterm  ?1  ? AB  ?   ? Living  ?2  ?  ? ? SAB  ?   ? IAB  ?   ? Ectopic  ?   ? Multiple  ?1  ? Live Births  ?2  ?   ?  ?  ? ? ? ?Home Medications   ? ?Prior to Admission medications   ?Medication Sig Start Date End Date Taking? Authorizing Provider  ?amitriptyline (ELAVIL) 25 MG tablet Take 2 tablets (50 mg total) by mouth at bedtime. 06/24/21 09/22/21 Yes Mayers, Cari S, PA-C  ?cetirizine (ZYRTEC ALLERGY) 10 MG tablet Take 1 tablet (10 mg total) by mouth daily. 12/31/20  Yes Camillia Herter, NP  ?Cholecalciferol (VITAMIN D3) 125 MCG (5000 UT) CAPS Take 1 capsule by mouth daily.   Yes [provider]  ?clindamycin (CLEOCIN T) 1 % lotion Apply to cleansed face daily in the morning. 06/30/21  Yes   ?cyclobenzaprine (FLEXERIL) 5 MG tablet Take 1 tablet (5 mg total) by mouth at bedtime as needed for muscle spasms. 04/08/21  Yes Camillia Herter, NP  ?Ferrous  Sulfate (IRON) 28 MG TABS Take 1 tablet by mouth daily.   Yes [provider]  ?fluticasone (FLONASE) 50 MCG/ACT nasal spray Place 2 sprays into both nostrils daily. 11/26/20  Yes Minette Brine, Amy J, NP  ?ibuprofen (ADVIL) 600 MG tablet Take 1 tablet (600 mg total) by mouth every 8 (eight) hours as needed. 12/31/20  Yes Minette Brine, Amy J, NP  ?metFORMIN (GLUCOPHAGE-XR) 500 MG 24 hr tablet Take 1 tablet (500 mg total) by mouth daily with breakfast. 06/24/21  Yes Mayers, Cari S, PA-C  ?tretinoin (RETIN-A) 0.025 % cream Apply pea size amount to cleansed face every other day at bedtime. Increase to every night as tolerated. 06/30/21  Yes   ?azelastine (ASTELIN) 0.1 % nasal spray Place 2 sprays into both nostrils 2 (two) times daily. 02/10/20   Ok Edwards, PA-C  ?cefdinir (OMNICEF) 300 MG capsule Take 1 capsule (300 mg total) by mouth 2 (two) times  daily. 06/11/21   Raspet, Derry Skill, PA-C  ?Hyoscyamine Sulfate SL (LEVSIN/SL) 0.125 MG SUBL Place 0.125 mg under the tongue 2 (two) times daily as needed. 12/25/20   Raspet, Derry Skill, PA-C  ?linaclotide (LINZESS) 145 MCG CAPS capsule Take 1 capsule (145 mcg total) by mouth daily before breakfast. 07/02/21   Willia Craze, NP  ?meloxicam (MOBIC) 7.5 MG tablet Take 1 tablet (7.5 mg total) by mouth daily. 04/08/21   Camillia Herter, NP  ?mirtazapine (REMERON) 15 MG tablet TAKE 2 TABLETS (30 MG TOTAL) BY MOUTH AT BEDTIME. 04/26/20 07/02/21  Zehr, Janett Billow D, PA-C  ?ondansetron (ZOFRAN-ODT) 4 MG disintegrating tablet Dissolve 1 tablet (4 mg total) by mouth every 8 (eight) hours as needed for nausea or vomiting. 05/22/21   Camillia Herter, NP  ?pantoprazole (PROTONIX) 40 MG tablet TAKE 1 TABLET (40 MG TOTAL) BY MOUTH DAILY. 04/26/20 07/02/21  Zehr, Janett Billow D, PA-C  ?triamcinolone cream (KENALOG) 0.1 % Apply to affected areas twice daily for 3 days on and 3 days off cycle or as needed. 06/30/21     ? ? ?Family History ?Family History  ?Problem Relation Age of Onset  ? Hypertension Mother   ? Hypertension Father   ? Breast cancer Maternal Grandmother   ?     51  ? Colon cancer Neg Hx   ? Colon polyps Neg Hx   ? Kidney disease Neg Hx   ? Diabetes Neg Hx   ? Esophageal cancer Neg Hx   ? Gallbladder disease Neg Hx   ? Heart disease Neg Hx   ? Asthma Neg Hx   ? Stroke Neg Hx   ? Stomach cancer Neg Hx   ? ? ?Social History ?Social History  ? ?Tobacco Use  ? Smoking status: Never  ? Smokeless tobacco: Never  ?Vaping Use  ? Vaping Use: Never used  ?Substance Use Topics  ? Alcohol use: No  ? Drug use: No  ? ? ? ?Allergies   ?Macrobid [nitrofurantoin] and Augmentin [amoxicillin-pot clavulanate] ? ? ?Review of Systems ?Review of Systems ?Per HPI ? ?Physical Exam ?Triage Vital Signs ?ED Triage Vitals  ?Enc Vitals Group  ?   BP 07/10/21 0829 (!) 141/89  ?   Pulse Rate 07/10/21 0829 97  ?   Resp 07/10/21 0829 16  ?   Temp 07/10/21 0829 98.7 ?F (37.1  ?C)  ?   Temp Source 07/10/21 0829 Oral  ?   SpO2 07/10/21 0829 97 %  ?   Weight --   ?   Height --   ?  Head Circumference --   ?   Peak Flow --   ?   Pain Score 07/10/21 0825 8  ?   Pain Loc --   ?   Pain Edu? --   ?   Excl. in [place]? --   ? ?No data found. ? ?Updated Vital Signs ?BP (!) 141/89 (BP Location: Right Arm)   Pulse 97   Temp 98.7 ?F (37.1 ?C) (Oral)   Resp 16   LMP 06/27/2021 (Approximate)   SpO2 97%  ? ?Visual Acuity ?Right Eye Distance:   ?Left Eye Distance:   ?Bilateral Distance:   ? ?Right Eye Near:   ?Left Eye Near:    ?Bilateral Near:    ? ?Physical Exam ?Constitutional:   ?   General: She is not in acute distress. ?   Appearance: Normal appearance. She is not toxic-appearing or diaphoretic.  ?HENT:  ?   Head: Normocephalic and atraumatic.  ?   Mouth/Throat:  ?   Mouth: Mucous membranes are moist.  ?   Pharynx: No posterior oropharyngeal erythema.  ?Eyes:  ?   Extraocular Movements: Extraocular movements intact.  ?   Conjunctiva/sclera: Conjunctivae normal.  ?Cardiovascular:  ?   Rate and Rhythm: Normal rate and regular rhythm.  ?   Pulses: Normal pulses.  ?   Heart sounds: Normal heart sounds.  ?Pulmonary:  ?   Effort: Pulmonary effort is normal. No respiratory distress.  ?   Breath sounds: Normal breath sounds.  ?Abdominal:  ?   General: Bowel sounds are normal. There is no distension.  ?   Palpations: Abdomen is soft.  ?   Tenderness: There is no abdominal tenderness.  ?Neurological:  ?   General: No focal deficit present.  ?   Mental Status: She is alert and oriented to person, place, and time. Mental status is at baseline.  ?Psychiatric:     ?   Mood and Affect: Mood normal.     ?   Behavior: Behavior normal.     ?   Thought Content: Thought content normal.     ?   Judgment: Judgment normal.  ? ? ? ?UC Treatments / Results  ?Labs ?(all labs ordered are listed, but only abnormal results are displayed) ?Labs Reviewed - No data to display ? ?EKG ? ? ?Radiology ?No results  found. ? ?Procedures ?Procedures (including critical care time) ? ?Medications Ordered in UC ?Medications - No data to display ? ?Initial Impression / Assessment and Plan / UC Course  ?I have reviewed the triage vital signs

## 2021-07-10 NOTE — Telephone Encounter (Signed)
Zofran sent pt's pharmacy. Pt also stated she was told by urgent care today to follow up with provider regarding abd pain. Pt scheduled for f/u visit on 07/30/21 at 8:30 am with [facility].  ?

## 2021-07-10 NOTE — Discharge Instructions (Signed)
Please follow-up with your primary care doctor for further evaluation and management as your medication may need to be changed.  Please go the hospital if symptoms persist or worsen. ?

## 2021-07-10 NOTE — Telephone Encounter (Signed)
Patient should keep appointment with PCP for adjusting.  GI upset is a common side effect for metformin.

## 2021-07-22 ENCOUNTER — Ambulatory Visit: Payer: Self-pay | Admitting: Physician Assistant

## 2021-07-22 ENCOUNTER — Other Ambulatory Visit: Payer: Self-pay

## 2021-07-22 ENCOUNTER — Encounter: Payer: Self-pay | Admitting: Physician Assistant

## 2021-07-22 VITALS — BP 143/97 | HR 105 | Resp 18 | Ht 59.0 in | Wt 195.0 lb

## 2021-07-22 DIAGNOSIS — Z6839 Body mass index (BMI) 39.0-39.9, adult: Secondary | ICD-10-CM

## 2021-07-22 DIAGNOSIS — F419 Anxiety disorder, unspecified: Secondary | ICD-10-CM

## 2021-07-22 DIAGNOSIS — R7303 Prediabetes: Secondary | ICD-10-CM

## 2021-07-22 DIAGNOSIS — E6609 Other obesity due to excess calories: Secondary | ICD-10-CM

## 2021-07-22 DIAGNOSIS — F32A Depression, unspecified: Secondary | ICD-10-CM

## 2021-07-22 MED ORDER — BUPROPION HCL ER (XL) 150 MG PO TB24
150.0000 mg | ORAL_TABLET | Freq: Every day | ORAL | 1 refills | Status: DC
  Filled 2021-07-22: qty 30, 30d supply, fill #0

## 2021-07-22 NOTE — Progress Notes (Unsigned)
Patient has eaten cereal today. Patient has not taken medication today. Patient reports stopping metformin on 5/11 after N/V/ diarrhea episodes. Patient reports cramping while on her cycle and wanting to discuss. Patient advised to reschedule with GYN form her cancelled 07/07/21 appointment.

## 2021-07-22 NOTE — Progress Notes (Unsigned)
   Established Patient Office Visit  Subjective   Patient ID: Sherri [name], female    DOB: 04/21/1985  Age: 36 y.o. MRN: [id number]  No chief complaint on file.   States that she started the metformin, but unfortunately was unable to tolerate it due to diarrhea     {History (Optional):23778}  ROS    Objective:     LMP 06/27/2021 (Approximate)  {Vitals History (Optional):23777}  Physical Exam   No results found for any visits on 07/22/21.  {Labs (Optional):23779}  The ASCVD Risk score (Arnett DK, et al., 2019) failed to calculate for the following reasons:   The 2019 ASCVD risk score is only valid for ages 41 to 43    Assessment & Plan:   Problem List Items Addressed This Visit   None   No follow-ups on file.    Loraine Grip Mayers, PA-C

## 2021-07-22 NOTE — Patient Instructions (Signed)
You are going to start taking Wellbutrin once daily.  I do encourage you to continue working on weight loss, avoid added sugars.  Make sure that you are reading all nutrition labels.  To help with your heavy menstrual cycles, I do encourage you to make a follow-up appointment with gynecology, start tracking your cycles, and you can start premedicating with ibuprofen a couple days prior to your cycle.  Please let us know if there is any else we can do for you.  Kennieth Rad, PA-C Physician Assistant [facility] [email or web address]   Menorrhagia Menorrhagia is a form of abnormal uterine bleeding in which menstrual periods are heavy or last longer than normal. With menorrhagia, the periods may cause enough blood loss and cramping that a woman becomes unable to take part in her usual activities. What are the causes? Common causes of this condition include: Polyps or fibroids. These are noncancerous growths in the uterus. An imbalance of the hormones estrogen and progesterone. Anovulation, which occurs when one of the ovaries does not release an egg during one or more months. A problem with the thyroid gland (hypothyroidism). Side effects of having an intrauterine device (IUD). Side effects of some medicines, such as NSAIDs or blood thinners. A bleeding disorder that stops the blood from clotting normally. In some cases, the cause of this condition is not known. What increases the risk? You are more likely to develop this condition if you have cancer of the uterus. What are the signs or symptoms? Symptoms of this condition include: Routinely having to change your pad or tampon every 1-2 hours because it is soaked. Needing to use pads and tampons at the same time because of heavy bleeding. Needing to wake up to change your pads or tampons during the night. Passing blood clots larger than 1 inch (2.5 cm) in size. Having bleeding that  lasts for more than 7 days. Having symptoms of low iron levels (anemia), such as tiredness (fatigue) or shortness of breath. How is this diagnosed? This condition may be diagnosed based on: A physical exam. Your symptoms and menstrual history. Tests, such as: Blood tests to check if you are pregnant or if you have hormonal changes, a bleeding or thyroid disorder, anemia, or other problems. Pap test to check for cancerous changes, infections, or inflammation. Endometrial biopsy. This test involves removing a tissue sample from the lining of the uterus (endometrium) to be examined under a microscope. Pelvic ultrasound. This test uses sound waves to create images of your uterus, ovaries, and vagina. The images can show if you have fibroids or other growths. Hysteroscopy. For this test, a thin, flexible tube with a light on the end (hysteroscope) is used to look inside your uterus. How is this treated? Treatment may not be needed for this condition. If it is needed, the best treatment for you will depend on: Whether you need to prevent pregnancy. Your desire to have children in the future. The cause and severity of your bleeding. Your personal preference. Medicine Medicines are the first step in treatment. You may be treated with: Hormonal birth control methods. These treatments reduce bleeding during your menstrual period. They include: Birth control pills. Skin patch. Vaginal ring. Shots (injections) that you get every 3 months. Hormonal IUD. Implants that go under the skin. Medicines that thicken the blood and slow bleeding. Medicines that reduce swelling, such as ibuprofen. Medicines that contain an artificial (synthetic) hormone called progestin. Medicines that make the ovaries stop working for a  short time. Iron supplements to treat anemia.  Surgery If medicines do not work, surgery may be done. Surgical options may include: Dilation and curettage (D&C). In this procedure, your  health care provider opens the lowest part of the uterus (cervix) and then scrapes or suctions tissue from the endometrium. This reduces menstrual bleeding. Operative hysteroscopy. In this procedure, a hysteroscope is used to view your uterus and help remove polyps that may be causing heavy periods. Endometrial ablation. This is when various techniques are used to permanently destroy your entire endometrium. After endometrial ablation, most women have little or no menstrual flow. This procedure reduces your ability to become pregnant. Endometrial resection. In this procedure, an electrosurgical wire loop is used to remove the endometrium. This procedure reduces your ability to become pregnant. Hysterectomy. This is surgical removal of your uterus. This is a permanent procedure that stops menstrual periods. Pregnancy is not possible after a hysterectomy. Follow these instructions at home: Medicines Take over-the-counter and prescription medicines only as told by your health care provider. This includes iron pills. Do not change or switch medicines without asking your health care provider. Do not take aspirin or medicines that contain aspirin 1 week before or during your menstrual period. Aspirin may make bleeding worse. Managing constipation Your iron pills may cause constipation. If you are taking prescription iron supplements, you may need to take these actions to prevent or treat constipation: Drink enough fluid to keep your urine pale yellow. Take over-the-counter or prescription medicines. Eat foods that are high in fiber, such as beans, whole grains, and fresh fruits and vegetables. Limit foods that are high in fat and processed sugars, such as fried or sweet foods. General instructions If you need to change your sanitary pad or tampon more than once every 2 hours, limit your activity until the bleeding stops. Eat well-balanced meals, including foods that are high in iron. Foods that have a  lot of iron include leafy green vegetables, meat, liver, eggs, and whole-grain breads and cereals. Do not try to lose weight until the abnormal bleeding has stopped and your blood iron level is back to normal. If you need to lose weight, work with your health care provider to lose weight safely. Keep all follow-up visits. This is important. Contact a health care provider if: You soak through a pad or tampon every 1 or 2 hours, and this happens every time you have a period. You need to use pads and tampons at the same time because you are bleeding so much. You have nausea, vomiting, diarrhea, or other problems related to medicines you are taking. Get help right away if: You soak through more than a pad or tampon in 1 hour. You pass clots bigger than 1 inch (2.5 cm) wide. You feel short of breath. You feel like your heart is beating too fast. You feel dizzy or you faint. You feel very weak or tired. Summary Menorrhagia is a form of abnormal uterine bleeding in which menstrual periods are heavy or last longer than normal. Treatment may not be needed for this condition. If it is needed, it may include medicines or procedures. Take over-the-counter and prescription medicines only as told by your health care provider. This includes iron pills. Get help right away if you have heavy bleeding that soaks through more than a pad or tampon in 1 hour, you pass large clots, or you feel dizzy, short of breath, or very weak or tired. This information is not intended to replace advice  given to you by your health care provider. Make sure you discuss any questions you have with your health care provider. Document Revised: 10/31/2019 Document Reviewed: 10/31/2019 Elsevier Patient Education  [place].

## 2021-07-23 ENCOUNTER — Ambulatory Visit: Payer: Self-pay | Admitting: *Deleted

## 2021-07-23 ENCOUNTER — Other Ambulatory Visit: Payer: Self-pay

## 2021-07-23 DIAGNOSIS — R7303 Prediabetes: Secondary | ICD-10-CM | POA: Insufficient documentation

## 2021-07-23 NOTE — Telephone Encounter (Signed)
Summary: diarrhea   Pt called in says diarrhea since this morning. Please call back      Called patient to review diarrhea sx. No answer, recording: call can not be completed at this time. Try call again later.

## 2021-07-23 NOTE — Telephone Encounter (Signed)
2nd attempt to reach pt, "Call cannot be completed at this time." Routing to practice for PCPs review per protocol.

## 2021-07-24 ENCOUNTER — Ambulatory Visit: Payer: Self-pay | Admitting: *Deleted

## 2021-07-24 NOTE — Telephone Encounter (Signed)
  Chief Complaint: Diarrhea Symptoms: Diarrhea after starting Wellbutrin 07/22/21 Frequency: 07/22/21   Pertinent Negatives: Patient denies vomiting Disposition: '[]'$ ED /'[]'$ Urgent Care (no appt availability in office) / '[]'$ Appointment(In office/virtual)/ '[]'$  [place] [facility]/ '[]'$ Home Care/ '[]'$ Refused Recommended Disposition /'[]'$ [place] [facility]/ '[x]'$  Follow-up with PCP Additional Notes: Pt states she took first dose  of Wellbutrin 07/22/21, loose stools through 07/23/21. Did not take today, no diarrhea. Would like alternate med.if appropriate

## 2021-07-24 NOTE — Telephone Encounter (Signed)
Reason for Disposition  [1] MILD diarrhea (e.g., 1-3 or more stools than normal in past 24 hours) without known cause AND [2] present >  7 days  Answer Assessment - Initial Assessment Questions 1. DIARRHEA SEVERITY: "How bad is the diarrhea?" "How many more stools have you had in the past 24 hours than normal?"    - NO DIARRHEA (SCALE 0)   - MILD (SCALE 1-3): Few loose or mushy BMs; increase of 1-3 stools over normal daily number of stools; mild increase in ostomy output.   -  MODERATE (SCALE 4-7): Increase of 4-6 stools daily over normal; moderate increase in ostomy output. * SEVERE (SCALE 8-10; OR 'WORST POSSIBLE'): Increase of 7 or more stools daily over normal; moderate increase in ostomy output; incontinence.     Moderate 2. ONSET: "When did the diarrhea begin?"       07/22/21 3. BM CONSISTENCY: "How loose or watery is the diarrhea?"      Loose 4. VOMITING: "Are you also vomiting?" If Yes, ask: "How many times in the past 24 hours?"      No 5. ABDOMINAL PAIN: "Are you having any abdominal pain?" If Yes, ask: "What does it feel like?" (e.g., crampy, dull, intermittent, constant)      Llittle cramping 6. ABDOMINAL PAIN SEVERITY: If present, ask: "How bad is the pain?"  (e.g., Scale 1-10; mild, moderate, or severe)   - MILD (1-3): doesn't interfere with normal activities, abdomen soft and not tender to touch    - MODERATE (4-7): interferes with normal activities or awakens from sleep, abdomen tender to touch    - SEVERE (8-10): excruciating pain, doubled over, unable to do any normal activities       Mild 7. ORAL INTAKE: If vomiting, "Have you been able to drink liquids?" "How much liquids have you had in the past 24 hours?"     No 8. HYDRATION: "Any signs of dehydration?" (e.g., dry mouth [not just dry lips], too weak to stand, dizziness, new weight loss) "When did you last urinate?"     No  Protocols used: Diarrhea-A-AH

## 2021-07-25 ENCOUNTER — Telehealth: Payer: Self-pay | Admitting: Family

## 2021-07-25 NOTE — Telephone Encounter (Signed)
Pt is calling back to follow up on the NT note from yesterday to see if a new medication was called in. Please advise

## 2021-07-25 NOTE — Telephone Encounter (Signed)
Is having Diarrhea w/ new medication (bupropion) and is asking for a change please advise and thank you.

## 2021-07-29 ENCOUNTER — Encounter: Payer: Self-pay | Admitting: Physician Assistant

## 2021-07-30 ENCOUNTER — Ambulatory Visit: Payer: Self-pay | Admitting: *Deleted

## 2021-07-30 ENCOUNTER — Ambulatory Visit: Payer: Self-pay | Admitting: Nurse Practitioner

## 2021-07-30 NOTE — Telephone Encounter (Signed)
  Chief Complaint: Started Wellbutrin and started having diarrhea.   Calling back to see what was decided about changing the medication.   Symptoms: diarrhea since starting wellbutrin Frequency: N/A Pertinent Negatives: Patient denies N/A Disposition: '[]'$ ED /'[]'$ Urgent Care (no appt availability in office) / '[]'$ Appointment(In office/virtual)/ '[]'$  [facility]/ '[]'$ Home Care/ '[]'$ Refused Recommended Disposition /'[]'$ [facility]/ '[x]'$  Follow-up with PCP Additional Notes: The phone line keeps disconnecting when she calls [place] and when I try to call her back.   Stays connected 3-5 minutes then disconnects every time we make contact.   Got a message the last time I tried to call her back that the text mail customer you are trying to reach is unavailable.   Please leave a message.   I left a voicemail to call PCE back and the number.    Number I'm calling her on is [phone] which is the number that shows up when she is calling in.

## 2021-07-30 NOTE — Telephone Encounter (Signed)
Reason for Disposition  [1] Caller has URGENT medicine question about med that PCP or specialist prescribed AND [2] triager unable to answer question  Answer Assessment - Initial Assessment Questions 1. NAME of MEDICATION: "What medicine are you calling about?"     Wellbutrin 2. QUESTION: "What is your question?" (e.g., double dose of medicine, side effect)     Did she change it?   I haven't heard anything more about it. 3. PRESCRIBING HCP: "Who prescribed it?" Reason: if prescribed by specialist, call should be referred to that group.     Durene Fruits, NP 4. SYMPTOMS: "Do you have any symptoms?"     N/A 5. SEVERITY: If symptoms are present, ask "Are they mild, moderate or severe?"     N/A 6. PREGNANCY:  "Is there any chance that you are pregnant?" "When was your last menstrual period?"     N/A  Protocols used: Medication Question Call-A-AH

## 2021-07-31 ENCOUNTER — Ambulatory Visit: Payer: Self-pay | Admitting: Physician Assistant

## 2021-08-01 ENCOUNTER — Other Ambulatory Visit (HOSPITAL_COMMUNITY): Payer: Self-pay

## 2021-08-05 NOTE — Progress Notes (Signed)
Erroneous encounter-disregard

## 2021-08-06 ENCOUNTER — Other Ambulatory Visit (HOSPITAL_COMMUNITY): Payer: Self-pay

## 2021-08-06 ENCOUNTER — Other Ambulatory Visit: Payer: Self-pay | Admitting: Gastroenterology

## 2021-08-06 MED ORDER — PANTOPRAZOLE SODIUM 40 MG PO TBEC
DELAYED_RELEASE_TABLET | Freq: Every day | ORAL | 0 refills | Status: DC
  Filled 2021-08-06 – 2021-10-10 (×2): qty 30, 30d supply, fill #0

## 2021-08-07 ENCOUNTER — Ambulatory Visit: Payer: Self-pay | Admitting: Allergy

## 2021-08-07 ENCOUNTER — Other Ambulatory Visit (HOSPITAL_COMMUNITY): Payer: Self-pay

## 2021-08-07 ENCOUNTER — Ambulatory Visit: Payer: Self-pay | Admitting: Podiatry

## 2021-08-11 ENCOUNTER — Encounter: Payer: Self-pay | Admitting: Family

## 2021-08-11 DIAGNOSIS — R7303 Prediabetes: Secondary | ICD-10-CM

## 2021-08-12 ENCOUNTER — Ambulatory Visit: Payer: Self-pay | Admitting: Physician Assistant

## 2021-08-13 ENCOUNTER — Telehealth: Payer: Self-pay | Admitting: Family Medicine

## 2021-08-13 ENCOUNTER — Other Ambulatory Visit (HOSPITAL_COMMUNITY): Payer: Self-pay

## 2021-08-13 NOTE — Progress Notes (Deleted)
Patient ID: Sherri [name], female    DOB: [birth year]  MRN: [id number]  CC: No chief complaint on file.   Subjective: Sherri [name] is a 36 y.o. female who presents for  Her concerns today include: ***  Last A1c 06/24/2021, not due  Metformin (predm) and Wellbutrin (anxiety depression) cause nausea/upset stomach initiated by PA Mayers  Patient Active Problem List   Diagnosis Date Noted   Pre-diabetes 07/23/2021   Anxiety and depression 06/24/2021   Fibromyalgia 06/24/2021   Prediabetes 07/05/2020   Gastroesophageal reflux disease 04/26/2020   Perianal rash 03/26/2020   Chronic nausea 03/26/2020   Chronic constipation 03/09/2019   Pelvic pain in female 03/09/2019   Hypertension    Genital herpes    Uterine fibroid 12/25/2013   Obesity 12/25/2013   Bacterial vaginitis 04/27/2012     Current Outpatient Medications on File Prior to Visit  Medication Sig Dispense Refill   amitriptyline (ELAVIL) 25 MG tablet Take 2 tablets (50 mg total) by mouth at bedtime. 60 tablet 2   azelastine (ASTELIN) 0.1 % nasal spray Place 2 sprays into both nostrils 2 (two) times daily. 30 mL 0   buPROPion (WELLBUTRIN XL) 150 MG 24 hr tablet Take 1 tablet (150 mg total) by mouth daily. 30 tablet 1   cefdinir (OMNICEF) 300 MG capsule Take 1 capsule (300 mg total) by mouth 2 (two) times daily. 20 capsule 0   cetirizine (ZYRTEC ALLERGY) 10 MG tablet Take 1 tablet (10 mg total) by mouth daily. 30 tablet 11   Cholecalciferol (VITAMIN D3) 125 MCG (5000 UT) CAPS Take 1 capsule by mouth daily.     clindamycin (CLEOCIN T) 1 % lotion Apply to cleansed face daily in the morning. 60 mL 1   cyclobenzaprine (FLEXERIL) 5 MG tablet Take 1 tablet (5 mg total) by mouth at bedtime as needed for muscle spasms. 30 tablet 0   Ferrous Sulfate (IRON) 28 MG TABS Take 1 tablet by mouth daily.     fluticasone (FLONASE) 50 MCG/ACT nasal spray Place 2 sprays into both nostrils daily. 16 g 0   Hyoscyamine Sulfate SL (LEVSIN/SL)  0.125 MG SUBL Place 0.125 mg under the tongue 2 (two) times daily as needed. 30 tablet 0   ibuprofen (ADVIL) 600 MG tablet Take 1 tablet (600 mg total) by mouth every 8 (eight) hours as needed. 30 tablet 0   linaclotide (LINZESS) 145 MCG CAPS capsule Take 1 capsule (145 mcg total) by mouth daily before breakfast. 30 capsule 2   meloxicam (MOBIC) 7.5 MG tablet Take 1 tablet (7.5 mg total) by mouth daily. 30 tablet 0   metFORMIN (GLUCOPHAGE-XR) 500 MG 24 hr tablet Take 1 tablet (500 mg total) by mouth daily with breakfast. (Patient not taking: Reported on 07/22/2021) 30 tablet 1   mirtazapine (REMERON) 15 MG tablet TAKE 2 TABLETS (30 MG TOTAL) BY MOUTH AT BEDTIME. 30 tablet 1   ondansetron (ZOFRAN-ODT) 4 MG disintegrating tablet Dissolve 1 tablet (4 mg total) by mouth every 8 (eight) hours as needed for nausea or vomiting. 30 tablet 1   pantoprazole (PROTONIX) 40 MG tablet TAKE 1 TABLET (40 MG TOTAL) BY MOUTH DAILY. 30 tablet 0   tretinoin (RETIN-A) 0.025 % cream Apply pea size amount to cleansed face every other day at bedtime. Increase to every night as tolerated. 20 g 0   triamcinolone cream (KENALOG) 0.1 % Apply to affected areas twice daily for 3 days on and 3 days off cycle or as needed. 80  g 0   No current facility-administered medications on file prior to visit.    Allergies  Allergen Reactions   Macrobid [Nitrofurantoin] Nausea And Vomiting   Augmentin [Amoxicillin-Pot Clavulanate]     Pt states it makes her sick   Metformin And Related Nausea And Vomiting    Social History   Socioeconomic History   Marital status: Single    Spouse name: Not on file   Number of children: Not on file   Years of education: Not on file   Highest education level: Not on file  Occupational History   Occupation: [employer]  Tobacco Use   Smoking status: Never   Smokeless tobacco: Never  Vaping Use   Vaping Use: Never used  Substance and Sexual Activity   Alcohol use: No   Drug use: No   Sexual  activity: Yes    Birth control/protection: None  Other Topics Concern   Not on file  Social History Narrative   Not on file   Social Determinants of Health   Financial Resource Strain: Not on file  Food Insecurity: Not on file  Transportation Needs: Not on file  Physical Activity: Not on file  Stress: Not on file  Social Connections: Not on file  Intimate Partner Violence: Not on file    Family History  Problem Relation Age of Onset   Hypertension Mother    Hypertension Father    Breast cancer Maternal Grandmother        81   Colon cancer Neg Hx    Colon polyps Neg Hx    Kidney disease Neg Hx    Diabetes Neg Hx    Esophageal cancer Neg Hx    Gallbladder disease Neg Hx    Heart disease Neg Hx    Asthma Neg Hx    Stroke Neg Hx    Stomach cancer Neg Hx     Past Surgical History:  Procedure Laterality Date   CESAREAN SECTION N/A 06/05/2014   Procedure: CESAREAN SECTION;  Surgeon: Truett Mainland, DO;  Location: [facility];  Service: Obstetrics;  Laterality: N/A;   WISDOM TOOTH EXTRACTION      ROS: Review of Systems Negative except as stated above  PHYSICAL EXAM: There were no vitals taken for this visit.  Physical Exam  {female adult master:310786} {female adult master:310785}     Latest Ref Rng & Units 01/21/2021    3:25 PM 09/05/2020    6:56 AM 09/04/2020   10:21 AM  CMP  Glucose 70 - 99 mg/dL 83  103  91   BUN 6 - 20 mg/dL '10  11  6   '$ Creatinine 0.57 - 1.00 mg/dL 0.83  0.83  0.87   Sodium 134 - 144 mmol/L 139  138  136   Potassium 3.5 - 5.2 mmol/L 4.2  4.0  3.8   Chloride 96 - 106 mmol/L 101  105  105   CO2 20 - 29 mmol/L '22  25  22   '$ Calcium 8.7 - 10.2 mg/dL 9.7  9.6  9.7   Total Protein 6.0 - 8.5 g/dL 7.3  7.9  8.0   Total Bilirubin 0.0 - 1.2 mg/dL <0.2  0.5  0.7   Alkaline Phos 44 - 121 IU/L 97  64  73   AST 0 - 40 IU/L '12  15  19   '$ ALT 0 - 32 IU/L '12  14  16    '$ Lipid Panel     Component Value Date/Time   CHOL  252 (H) 07/03/2020 1409   TRIG 129  07/03/2020 1409   HDL 52 07/03/2020 1409   CHOLHDL 4.8 (H) 07/03/2020 1409   CHOLHDL 3.5 01/07/2017 1419   LDLCALC 177 (H) 07/03/2020 1409   LDLCALC 128 (H) 01/07/2017 1419    CBC    Component Value Date/Time   WBC 8.0 01/21/2021 1525   WBC 5.1 09/05/2020 0656   RBC 4.59 01/21/2021 1525   RBC 4.53 09/05/2020 0656   HGB 13.3 01/21/2021 1525   HCT 40.3 01/21/2021 1525   PLT 251 01/21/2021 1525   MCV 88 01/21/2021 1525   MCH 29.0 01/21/2021 1525   MCH 28.9 09/05/2020 0656   MCHC 33.0 01/21/2021 1525   MCHC 32.3 09/05/2020 0656   RDW 14.4 01/21/2021 1525   LYMPHSABS 2.0 09/05/2020 0656   LYMPHSABS 2.8 07/14/2019 0950   MONOABS 0.4 09/05/2020 0656   EOSABS 0.1 09/05/2020 0656   EOSABS 0.2 07/14/2019 0950   BASOSABS 0.0 09/05/2020 0656   BASOSABS 0.0 07/14/2019 0950    ASSESSMENT AND PLAN:  There are no diagnoses linked to this encounter.   Patient was given the opportunity to ask questions.  Patient verbalized understanding of the plan and was able to repeat key elements of the plan. Patient was given clear instructions to go to Emergency Department or return to medical center if symptoms don't improve, worsen, or new problems develop.The patient verbalized understanding.   No orders of the defined types were placed in this encounter.    Requested Prescriptions    No prescriptions requested or ordered in this encounter    No follow-ups on file.  Camillia Herter, NP

## 2021-08-13 NOTE — Telephone Encounter (Signed)
Called patient regarding appt for 5/11 to see if we needed to reschedule or not. Left voicemail

## 2021-08-16 ENCOUNTER — Other Ambulatory Visit (HOSPITAL_COMMUNITY): Payer: Self-pay

## 2021-08-19 ENCOUNTER — Ambulatory Visit: Payer: Self-pay | Admitting: Family

## 2021-08-20 ENCOUNTER — Ambulatory Visit: Payer: Self-pay | Admitting: Nurse Practitioner

## 2021-08-21 ENCOUNTER — Ambulatory Visit (HOSPITAL_COMMUNITY)
Admission: EM | Admit: 2021-08-21 | Discharge: 2021-08-21 | Disposition: A | Discharge status: Home / Self Care | Payer: Self-pay | Attending: Physician Assistant | Admitting: Physician Assistant

## 2021-08-21 ENCOUNTER — Encounter (HOSPITAL_COMMUNITY): Payer: Self-pay

## 2021-08-21 DIAGNOSIS — J0120 Acute ethmoidal sinusitis, unspecified: Secondary | ICD-10-CM

## 2021-08-21 MED ORDER — METHYLPREDNISOLONE SODIUM SUCC 125 MG IJ SOLR
125.0000 mg | Freq: Once | INTRAMUSCULAR | Status: AC
Start: 2021-08-21 — End: 2021-08-21
  Administered 2021-08-21: 125 mg via INTRAMUSCULAR

## 2021-08-21 MED ORDER — SULFAMETHOXAZOLE-TRIMETHOPRIM 800-160 MG PO TABS
1.0000 | ORAL_TABLET | Freq: Two times a day (BID) | ORAL | 0 refills | Status: DC
  Filled 2021-08-21: qty 20, 10d supply, fill #0

## 2021-08-21 MED ORDER — STERILE WATER FOR INJECTION IJ SOLN
INTRAMUSCULAR | Status: AC
  Filled 2021-08-21: qty 10

## 2021-08-21 MED ORDER — METHYLPREDNISOLONE SODIUM SUCC 125 MG IJ SOLR
INTRAMUSCULAR | Status: AC
  Filled 2021-08-21: qty 2

## 2021-08-21 NOTE — ED Triage Notes (Signed)
Pt states nasal congestion and headache,body aches for the past week. States she has not taken anything for her symptoms.

## 2021-08-21 NOTE — ED Provider Notes (Signed)
[facility]    CSN: [id number] Arrival date & time: 08/21/21  1811      History   Chief Complaint Chief Complaint  Patient presents with   Nasal Congestion    HPI Sherri [name] is a 36 y.o. female.   Patient complains of a headache and sinus congestion patient reports she has had sinus pressure and facial pain for the past week.  Patient reports she has had sinus infections in the past  The history is provided by the patient. No language interpreter was used.    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    Urinary tract infection    Uterine fibroid     Patient Active Problem List   Diagnosis Date Noted   Pre-diabetes 07/23/2021   Anxiety and depression 06/24/2021   Fibromyalgia 06/24/2021   Prediabetes 07/05/2020   Gastroesophageal reflux disease 04/26/2020   Perianal rash 03/26/2020   Chronic nausea 03/26/2020   Chronic constipation 03/09/2019   Pelvic pain in female 03/09/2019   Hypertension    Genital herpes    Uterine fibroid 12/25/2013   Obesity 12/25/2013   Bacterial vaginitis 04/27/2012    Past Surgical History:  Procedure Laterality Date   CESAREAN SECTION N/A 06/05/2014   Procedure: CESAREAN SECTION;  Surgeon: Truett Mainland, DO;  Location: [facility];  Service: Obstetrics;  Laterality: N/A;   WISDOM TOOTH EXTRACTION      OB History     Gravida  1   Para  1   Term      Preterm  1   AB      Living  2      SAB      IAB      Ectopic      Multiple  1   Live Births  2            Home Medications    Prior to Admission medications   Medication Sig Start Date End Date Taking? Authorizing Provider  sulfamethoxazole-trimethoprim (BACTRIM DS) 800-160 MG tablet Take 1 tablet by mouth 2 (two) times daily. 08/21/21  Yes Caryl Ada K,  PA-C  amitriptyline (ELAVIL) 25 MG tablet Take 2 tablets (50 mg total) by mouth at bedtime. 06/24/21 09/22/21  Mayers, Cari S, PA-C  azelastine (ASTELIN) 0.1 % nasal spray Place 2 sprays into both nostrils 2 (two) times daily. 02/10/20   Tasia Catchings, Amy V, PA-C  buPROPion (WELLBUTRIN XL) 150 MG 24 hr tablet Take 1 tablet (150 mg total) by mouth daily. 07/22/21   Mayers, Cari S, PA-C  cefdinir (OMNICEF) 300 MG capsule Take 1 capsule (300 mg total) by mouth 2 (two) times daily. 06/11/21   Raspet, Derry Skill, PA-C  cetirizine (ZYRTEC ALLERGY) 10 MG tablet Take 1 tablet (10 mg total) by mouth daily. 12/31/20   Camillia Herter, NP  Cholecalciferol (VITAMIN D3) 125 MCG (5000 UT) CAPS Take 1 capsule by mouth daily.    [provider]  clindamycin (CLEOCIN T) 1 % lotion Apply to cleansed face daily in the morning. 06/30/21     cyclobenzaprine (FLEXERIL) 5 MG tablet Take 1 tablet (5 mg total) by mouth at bedtime as needed for muscle spasms. 04/08/21   Camillia Herter, NP  Ferrous Sulfate (IRON) 28 MG TABS Take  1 tablet by mouth daily.    [provider]  fluticasone (FLONASE) 50 MCG/ACT nasal spray Place 2 sprays into both nostrils daily. 11/26/20   Camillia Herter, NP  Hyoscyamine Sulfate SL (LEVSIN/SL) 0.125 MG SUBL Place 0.125 mg under the tongue 2 (two) times daily as needed. 12/25/20   Raspet, Derry Skill, PA-C  ibuprofen (ADVIL) 600 MG tablet Take 1 tablet (600 mg total) by mouth every 8 (eight) hours as needed. 12/31/20   Camillia Herter, NP  linaclotide [facility]) 145 MCG CAPS capsule Take 1 capsule (145 mcg total) by mouth daily before breakfast. 07/02/21   Willia Craze, NP  meloxicam (MOBIC) 7.5 MG tablet Take 1 tablet (7.5 mg total) by mouth daily. 04/08/21   Camillia Herter, NP  metFORMIN (GLUCOPHAGE-XR) 500 MG 24 hr tablet Take 1 tablet (500 mg total) by mouth daily with breakfast. Patient not taking: Reported on 07/22/2021 06/24/21   Mayers, Cari S, PA-C  mirtazapine (REMERON) 15 MG tablet TAKE 2 TABLETS  (30 MG TOTAL) BY MOUTH AT BEDTIME. 04/26/20 07/02/21  Zehr, Janett Billow D, PA-C  ondansetron (ZOFRAN-ODT) 4 MG disintegrating tablet Dissolve 1 tablet (4 mg total) by mouth every 8 (eight) hours as needed for nausea or vomiting. 07/10/21   Willia Craze, NP  pantoprazole (PROTONIX) 40 MG tablet TAKE 1 TABLET (40 MG TOTAL) BY MOUTH DAILY. 08/06/21 08/06/22  Zehr, Laban Emperor, PA-C  tretinoin (RETIN-A) 0.025 % cream Apply pea size amount to cleansed face every other day at bedtime. Increase to every night as tolerated. 06/30/21     triamcinolone cream (KENALOG) 0.1 % Apply to affected areas twice daily for 3 days on and 3 days off cycle or as needed. 06/30/21       Family History Family History  Problem Relation Age of Onset   Hypertension Mother    Hypertension Father    Breast cancer Maternal Grandmother        49   Colon cancer Neg Hx    Colon polyps Neg Hx    Kidney disease Neg Hx    Diabetes Neg Hx    Esophageal cancer Neg Hx    Gallbladder disease Neg Hx    Heart disease Neg Hx    Asthma Neg Hx    Stroke Neg Hx    Stomach cancer Neg Hx     Social History Social History   Tobacco Use   Smoking status: Never   Smokeless tobacco: Never  Vaping Use   Vaping Use: Never used  Substance Use Topics   Alcohol use: No   Drug use: No     Allergies   Macrobid [nitrofurantoin], Augmentin [amoxicillin-pot clavulanate], and Metformin and related   Review of Systems Review of Systems  HENT:  Positive for rhinorrhea, sinus pressure, sinus pain and sore throat.   All other systems reviewed and are negative.    Physical Exam Triage Vital Signs ED Triage Vitals  Enc Vitals Group     BP --      Pulse Rate 08/21/21 1835 73     Resp 08/21/21 1835 16     Temp 08/21/21 1835 99 F (37.2 C)     Temp Source 08/21/21 1835 Oral     SpO2 08/21/21 1835 99 %     Weight --      Height --      Head Circumference --      Peak Flow --      Pain Score 08/21/21 1836 8  Pain Loc --      Pain  Edu? --      Excl. in [place]? --    No data found.  Updated Vital Signs Pulse 73   Temp 99 F (37.2 C) (Oral)   Resp 16   LMP 08/17/2021 (Exact Date)   SpO2 99%   Visual Acuity Right Eye Distance:   Left Eye Distance:   Bilateral Distance:    Right Eye Near:   Left Eye Near:    Bilateral Near:     Physical Exam Constitutional:      Appearance: She is well-developed.  HENT:     Head: Normocephalic and atraumatic.     Right Ear: Tympanic membrane normal.     Left Ear: Tympanic membrane normal.     Mouth/Throat:     Pharynx: Posterior oropharyngeal erythema present.  Eyes:     Conjunctiva/sclera: Conjunctivae normal.     Pupils: Pupils are equal, round, and reactive to light.  Pulmonary:     Effort: Pulmonary effort is normal.  Abdominal:     Palpations: Abdomen is soft.  Musculoskeletal:        General: Normal range of motion.     Cervical back: Normal range of motion and neck supple.  Skin:    General: Skin is warm and dry.  Neurological:     Mental Status: She is alert and oriented to person, place, and time.      UC Treatments / Results  Labs (all labs ordered are listed, but only abnormal results are displayed) Labs Reviewed - No data to display  EKG   Radiology No results found.  Procedures Procedures (including critical care time)  Medications Ordered in UC Medications  methylPREDNISolone sodium succinate (SOLU-MEDROL) 125 mg/2 mL injection 125 mg (125 mg Intramuscular Given 08/21/21 1905)    Initial Impression / Assessment and Plan / UC Course  I have reviewed the triage vital signs and the nursing notes.  Pertinent labs & imaging results that were available during my care of the patient were reviewed by me and considered in my medical decision making (see chart for details).     Patient requesting injection of steroids patient is given Solu-Medrol 125 also will treat her with Bactrim Final Clinical Impressions(s) / UC Diagnoses   Final  diagnoses:  Acute ethmoidal sinusitis, recurrence not specified   Discharge Instructions   None    ED Prescriptions     Medication Sig Dispense Auth. Provider   sulfamethoxazole-trimethoprim (BACTRIM DS) 800-160 MG tablet Take 1 tablet by mouth 2 (two) times daily. 20 tablet Fransico Meadow, [place]      PDMP not reviewed this encounter. An After Visit Summary was printed and given to the patient.    Fransico Meadow, [place] 08/21/21 1958

## 2021-08-22 ENCOUNTER — Other Ambulatory Visit: Payer: Self-pay

## 2021-08-22 ENCOUNTER — Other Ambulatory Visit (HOSPITAL_COMMUNITY): Payer: Self-pay

## 2021-08-25 ENCOUNTER — Other Ambulatory Visit (HOSPITAL_COMMUNITY): Payer: Self-pay

## 2021-08-26 ENCOUNTER — Encounter (HOSPITAL_COMMUNITY): Payer: Self-pay | Admitting: Pharmacist

## 2021-08-26 ENCOUNTER — Other Ambulatory Visit (HOSPITAL_COMMUNITY): Payer: Self-pay

## 2021-08-26 NOTE — Progress Notes (Signed)
Patient ID: Sherri [name], female    DOB: [birth year]  MRN: [id number]  CC: Follow-up   Subjective: Sherri [name] is a 36 y.o. female who presents for follow-up.  Her concerns today include:  - No longer taking Metformin because of diarrhea side effect.  - Taking Wellbutrin prior to bedtime and helps with sleep. Feels mood fluctuates and most days doesn't want to be bothered with anyone. She is interested in counseling.  - Appointment scheduled with Gynecology August 2023.      09/05/2021    4:09 PM 06/24/2021    8:32 AM 01/21/2021    2:08 PM 01/15/2021    3:46 PM 12/31/2020   10:01 AM  Depression screen PHQ 2/9  Decreased Interest 0 0 0 0 0  Down, Depressed, Hopeless 0 0 0 0 0  PHQ - 2 Score 0 0 0 0 0  Altered sleeping 0 0  0 0  Tired, decreased energy 0 3  0 0  Change in appetite 0 3  0 0  Feeling bad or failure about yourself  0 0  0 0  Trouble concentrating 0 0  0 0  Moving slowly or fidgety/restless 0 0  0 0  Suicidal thoughts 0 0  0 0  PHQ-9 Score 0 6  0 0  Difficult doing work/chores Not difficult at all  Not difficult at all Not difficult at all      Patient Active Problem List   Diagnosis Date Noted   Pre-diabetes 07/23/2021   Anxiety and depression 06/24/2021   Fibromyalgia 06/24/2021   Prediabetes 07/05/2020   Gastroesophageal reflux disease 04/26/2020   Perianal rash 03/26/2020   Chronic nausea 03/26/2020   Chronic constipation 03/09/2019   Pelvic pain in female 03/09/2019   Hypertension    Genital herpes    Uterine fibroid 12/25/2013   Obesity 12/25/2013   Bacterial vaginitis 04/27/2012     Current Outpatient Medications on File Prior to Visit  Medication Sig Dispense Refill   amitriptyline (ELAVIL) 25 MG tablet Take 2 tablets (50 mg total) by mouth at bedtime. 60 tablet 2   azelastine (ASTELIN) 0.1 % nasal spray Place 2 sprays into both nostrils 2 (two) times daily. 30 mL 0   buPROPion (WELLBUTRIN XL) 150 MG 24 hr tablet Take 1 tablet (150 mg  total) by mouth daily. 30 tablet 1   cefdinir (OMNICEF) 300 MG capsule Take 1 capsule (300 mg total) by mouth 2 (two) times daily. 20 capsule 0   cetirizine (ZYRTEC ALLERGY) 10 MG tablet Take 1 tablet (10 mg total) by mouth daily. 30 tablet 11   Cholecalciferol (VITAMIN D3) 125 MCG (5000 UT) CAPS Take 1 capsule by mouth daily.     clindamycin (CLEOCIN T) 1 % lotion Apply to cleansed face daily in the morning. 60 mL 1   cyclobenzaprine (FLEXERIL) 5 MG tablet Take 1 tablet (5 mg total) by mouth at bedtime as needed for muscle spasms. 30 tablet 0   Ferrous Sulfate (IRON) 28 MG TABS Take 1 tablet by mouth daily.     fluticasone (FLONASE) 50 MCG/ACT nasal spray Place 2 sprays into both nostrils daily. 16 g 0   Hyoscyamine Sulfate SL (LEVSIN/SL) 0.125 MG SUBL Place 0.125 mg under the tongue 2 (two) times daily as needed. 30 tablet 0   ibuprofen (ADVIL) 600 MG tablet Take 1 tablet (600 mg total) by mouth every 8 (eight) hours as needed. 30 tablet 0   linaclotide (LINZESS) 145 MCG CAPS capsule  Take 1 capsule (145 mcg total) by mouth daily before breakfast. 30 capsule 2   meloxicam (MOBIC) 7.5 MG tablet Take 1 tablet (7.5 mg total) by mouth daily. 30 tablet 0   metFORMIN (GLUCOPHAGE-XR) 500 MG 24 hr tablet Take 1 tablet (500 mg total) by mouth daily with breakfast. 30 tablet 1   ondansetron (ZOFRAN-ODT) 4 MG disintegrating tablet Dissolve 1 tablet (4 mg total) by mouth every 8 (eight) hours as needed for nausea or vomiting. 30 tablet 1   pantoprazole (PROTONIX) 40 MG tablet TAKE 1 TABLET (40 MG TOTAL) BY MOUTH DAILY. 30 tablet 0   sulfamethoxazole-trimethoprim (BACTRIM DS) 800-160 MG tablet Take 1 tablet by mouth 2 (two) times daily. 20 tablet 0   tretinoin (RETIN-A) 0.025 % cream Apply pea size amount to cleansed face every other day at bedtime. Increase to every night as tolerated. 20 g 0   triamcinolone cream (KENALOG) 0.1 % Apply to affected areas twice daily for 3 days on and 3 days off cycle or as  needed. 80 g 0   mirtazapine (REMERON) 15 MG tablet TAKE 2 TABLETS (30 MG TOTAL) BY MOUTH AT BEDTIME. 30 tablet 1   No current facility-administered medications on file prior to visit.    Allergies  Allergen Reactions   Macrobid [Nitrofurantoin] Nausea And Vomiting   Augmentin [Amoxicillin-Pot Clavulanate]     Pt states it makes her sick   Metformin And Related Nausea And Vomiting    Social History   Socioeconomic History   Marital status: Single    Spouse name: Not on file   Number of children: Not on file   Years of education: Not on file   Highest education level: Not on file  Occupational History   Occupation: [employer]  Tobacco Use   Smoking status: Never   Smokeless tobacco: Never  Vaping Use   Vaping Use: Never used  Substance and Sexual Activity   Alcohol use: No   Drug use: No   Sexual activity: Yes    Birth control/protection: None  Other Topics Concern   Not on file  Social History Narrative   Not on file   Social Determinants of Health   Financial Resource Strain: Not on file  Food Insecurity: Not on file  Transportation Needs: Not on file  Physical Activity: Not on file  Stress: Not on file  Social Connections: Not on file  Intimate Partner Violence: Not on file    Family History  Problem Relation Age of Onset   Hypertension Mother    Hypertension Father    Breast cancer Maternal Grandmother        69   Colon cancer Neg Hx    Colon polyps Neg Hx    Kidney disease Neg Hx    Diabetes Neg Hx    Esophageal cancer Neg Hx    Gallbladder disease Neg Hx    Heart disease Neg Hx    Asthma Neg Hx    Stroke Neg Hx    Stomach cancer Neg Hx     Past Surgical History:  Procedure Laterality Date   CESAREAN SECTION N/A 06/05/2014   Procedure: CESAREAN SECTION;  Surgeon: Truett Mainland, DO;  Location: [facility];  Service: Obstetrics;  Laterality: N/A;   WISDOM TOOTH EXTRACTION      ROS: Review of Systems Negative except as stated above  PHYSICAL  EXAM: BP 134/89   Pulse 93   Temp 98.1 F (36.7 C) (Oral)   Resp 16  Wt 187 lb (84.8 kg)   LMP 08/17/2021 (Exact Date)   SpO2 99%   BMI 37.77 kg/m   Physical Exam HENT:     Head: Normocephalic and atraumatic.  Eyes:     Extraocular Movements: Extraocular movements intact.     Conjunctiva/sclera: Conjunctivae normal.     Pupils: Pupils are equal, round, and reactive to light.  Cardiovascular:     Rate and Rhythm: Normal rate and regular rhythm.     Pulses: Normal pulses.     Heart sounds: Normal heart sounds.  Pulmonary:     Effort: Pulmonary effort is normal.     Breath sounds: Normal breath sounds.  Musculoskeletal:     Cervical back: Normal range of motion and neck supple.  Neurological:     General: No focal deficit present.     Mental Status: She is alert and oriented to person, place, and time.  Psychiatric:        Mood and Affect: Mood normal.        Behavior: Behavior normal.     ASSESSMENT AND PLAN: 1. Prediabetes - Hemoglobin A1c 5.8% on 06/24/2021. - Recheck around October 2023.  2. Anxiety and depression - Patient denies thoughts of self-harm, suicidal ideations, homicidal ideations. - Continue Wellbutrin as prescribed. No refills needed as of present.   - Referral to Psychiatry for further evaluation and management.  - Ambulatory referral to Psychiatry    Patient was given the opportunity to ask questions.  Patient verbalized understanding of the plan and was able to repeat key elements of the plan. Patient was given clear instructions to go to Emergency Department or return to medical center if symptoms don't improve, worsen, or new problems develop.The patient verbalized understanding.   Orders Placed This Encounter  Procedures   Ambulatory referral to Psychiatry   Follow-up with primary provider as scheduled.   Camillia Herter, NP

## 2021-08-27 ENCOUNTER — Other Ambulatory Visit (HOSPITAL_COMMUNITY): Payer: Self-pay

## 2021-09-01 ENCOUNTER — Other Ambulatory Visit: Payer: Self-pay

## 2021-09-05 ENCOUNTER — Ambulatory Visit (INDEPENDENT_AMBULATORY_CARE_PROVIDER_SITE_OTHER): Payer: Self-pay | Admitting: Family

## 2021-09-05 ENCOUNTER — Encounter: Payer: Self-pay | Admitting: Family

## 2021-09-05 VITALS — BP 134/89 | HR 93 | Temp 98.1°F | Resp 16 | Wt 187.0 lb

## 2021-09-05 DIAGNOSIS — F419 Anxiety disorder, unspecified: Secondary | ICD-10-CM

## 2021-09-05 DIAGNOSIS — F32A Depression, unspecified: Secondary | ICD-10-CM

## 2021-09-05 DIAGNOSIS — R7303 Prediabetes: Secondary | ICD-10-CM

## 2021-09-18 ENCOUNTER — Ambulatory Visit (INDEPENDENT_AMBULATORY_CARE_PROVIDER_SITE_OTHER): Payer: Self-pay | Admitting: Nurse Practitioner

## 2021-09-18 ENCOUNTER — Telehealth: Payer: Self-pay | Admitting: Nurse Practitioner

## 2021-09-18 ENCOUNTER — Encounter: Payer: Self-pay | Admitting: Nurse Practitioner

## 2021-09-18 ENCOUNTER — Other Ambulatory Visit: Payer: Self-pay

## 2021-09-18 ENCOUNTER — Other Ambulatory Visit (INDEPENDENT_AMBULATORY_CARE_PROVIDER_SITE_OTHER): Payer: Self-pay

## 2021-09-18 VITALS — BP 126/78 | HR 97 | Ht 59.0 in | Wt 187.0 lb

## 2021-09-18 DIAGNOSIS — R109 Unspecified abdominal pain: Secondary | ICD-10-CM

## 2021-09-18 DIAGNOSIS — G8929 Other chronic pain: Secondary | ICD-10-CM

## 2021-09-18 DIAGNOSIS — K5909 Other constipation: Secondary | ICD-10-CM

## 2021-09-18 DIAGNOSIS — R112 Nausea with vomiting, unspecified: Secondary | ICD-10-CM

## 2021-09-18 LAB — IBC + FERRITIN
Ferritin: 35.7 ng/mL (ref 10.0–291.0)
Iron: 47 ug/dL (ref 42–145)
Saturation Ratios: 12 % — ABNORMAL LOW (ref 20.0–50.0)
TIBC: 392 ug/dL (ref 250.0–450.0)
Transferrin: 280 mg/dL (ref 212.0–360.0)

## 2021-09-18 LAB — CBC
HCT: 40.2 % (ref 36.0–46.0)
Hemoglobin: 13.2 g/dL (ref 12.0–15.0)
MCHC: 32.8 g/dL (ref 30.0–36.0)
MCV: 86.4 fl (ref 78.0–100.0)
Platelets: 279 10*3/uL (ref 150.0–400.0)
RBC: 4.65 Mil/uL (ref 3.87–5.11)
RDW: 16.4 % — ABNORMAL HIGH (ref 11.5–15.5)
WBC: 6.6 10*3/uL (ref 4.0–10.5)

## 2021-09-18 MED ORDER — MOTEGRITY 2 MG PO TABS
2.0000 mg | ORAL_TABLET | Freq: Every day | ORAL | 3 refills | Status: DC
  Filled 2021-09-18: qty 30, 30d supply, fill #0

## 2021-09-18 MED ORDER — NA SULFATE-K SULFATE-MG SULF 17.5-3.13-1.6 GM/177ML PO SOLN
1.0000 | Freq: Once | ORAL | 0 refills | Status: AC
  Filled 2021-09-18: qty 354, 1d supply, fill #0

## 2021-09-18 NOTE — Telephone Encounter (Signed)
Patient returned your call, please advise. 

## 2021-09-18 NOTE — Patient Instructions (Signed)
If you are age 35 or older, your body mass index should be between 23-30. Your Body mass index is 37.77 kg/m. If this is out of the aforementioned range listed, please consider follow up with your Primary Care Provider.  If you are age 70 or younger, your body mass index should be between 19-25. Your Body mass index is 37.77 kg/m. If this is out of the aformentioned range listed, please consider follow up with your Primary Care Provider.   Your provider has requested that you go to the basement level for lab work before leaving today. Press "B" on the elevator. The lab is located at the first door on the left as you exit the elevator.   You have been scheduled for an endoscopy and colonoscopy. Please follow the written instructions given to you at your visit today. Please pick up your prep supplies at the pharmacy within the next 1-3 days. If you use inhalers (even only as needed), please bring them with you on the day of your procedure.   The [facility] providers would like to encourage you to use [facility] to communicate with providers for non-urgent requests or questions.  Due to long hold times on the telephone, sending your provider a message by [facility] may be a faster and more efficient way to get a response.  Please allow 48 business hours for a response.  Please remember that this is for non-urgent requests.   Due to recent changes in healthcare laws, you may see the results of your imaging and laboratory studies on MyChart before your provider has had a chance to review them.  We understand that in some cases there may be results that are confusing or concerning to you. Not all laboratory results come back in the same time frame and the provider may be waiting for multiple results in order to interpret others.  Please give [place] 48 hours in order for your provider to thoroughly review all the results before contacting the office for clarification of your results.   It was a pleasure to see you  today!  Thank you for trusting me with your gastrointestinal care!    Tye Savoy, NP

## 2021-09-18 NOTE — Progress Notes (Signed)
Chief Complaint:  generalized abdominal pain, nausea, vomiting, constipation.    Assessment &  [name]   # Chronic generalized abdominal pain / chronic constipation / chronic nausea and vomiting. Extensive abdominal imaging has been unrevealing. Her labs have been unremarkable. No improvement in constipation with miralax, Sherri [name] , or Sherri [name]. Didn't tolerate Sherri [name] due to diarrhea. Sherri [name] nor Sherri [name] helpful for abdominal pain. .  Suspect symptoms are functional but in light of persistent symptoms and history of IDA will proceed with an EGD and colonoscopy.  The risks and benefits of EGD and colonoscopy with possible polypectomy / biopsies were discussed and the patient agrees to proceed. Will need two day bowel prep Hold iron prior to procedures.  Given severe constipation will considering stopping or reducing dose of oral iron based on today's labs.  Additionally Sherri [name] probably contributing to constipation. Trial of Sherri [name] 2 mg daily   Of note she is already on a TCA. Takes 50 mg of Sherri [name] daily  # History of IDA, resolved on chronic iron.  Negative celiac serologies. IDA likely related to menses but in light of her ongoing upper and lower GI symptoms will proceed with EGD / colonoscopy. .  No labs in last 6 months. Will repeat CBC, iron studies. Depending on results we can possibly stop or decrease dosage of iron   # GERD. Asymptomatic on daily Sherri [name].    # Chronic nausea / vomiting. Suspect functional.  Continue Sherri [name] as needed. Further evaluation at time of EGD.    HPI   Sherri [name] is a 36 year old female known to Sherri [name].  She has a past medical history of GERD /hiatal hernia, chronic constipation, chronic abdominal pain, obesity, fibromyalgia, hypertension.  Additional medical history as listed in the [place].   Sherri [name] has been seen here several times by different providers for evaluation of multiple GI issues. Over the the years she has had several imaging studies  including plain abdominal films, CT scan, ultrasounds, barium enema and UGI series. Previous US in July 2022 showed borderline CBD dilation at 6 to 7 mm, borderline gallbladder wall thickening felt due to under distention with focal adenomyomatosis otherwise unremarkable study. LFTs checked several times have been normal.  For constipation she has previously tried miralax, Sherri [name] and Sherri [name] without benefit. She was last seen by me on 07/02/21.   07/02/2021 office visit for lower abdominal pain and constipation.  The Sherri [name] she had been prescribed at the previous visit in December 2022 did not help.  She had kept a food and beverage diary as Sherri [name] recommended but did not find any correlations between food and her symptoms. She was prescribed Sherri [name] for constipation.   Interval history:  Sherri [name] caused diarrhea so she stopped it a couple of months ago. She is back to being constipated having less than three BMs a week. SL Sherri [name] is on list ( ? Not sure when that was prescribed) but she isn't taking it because it doesn't help. She drinks 5 bottles of water a day. She has fruit with every meal. She is on daily iron which may be contributing to constipation. She continues to have generalized abdominal pain. The pain doesn't get better with bowel movements,  in fact pain gets worse with defecation. She points to the left mid and right mid abdomen as where she hurts. No blood in stool.   She has been trying to lose weight. Eating more salads and has cut back on burgers , hot dogs. She had lost  down from 197 pounds in late April to 187 in early July but no further weight loss since.   **Sherri [name], Sherri [name] and Sherri [name] on home med list but not taking these.        Previous GI Evaluation    CTAP w/ contrast in 2015.  IMPRESSION:  1. Suggestion of mild wall thickening along the gallbladder; the  gallbladder is mildly distended. Mild intrahepatic biliary ductal  dilatation. Slight prominence  of the common hepatic duct to 7 mm. This could reflect distal obstruction. Would correlate with LFTs and  consider MRCP for further evaluation, as deemed clinically  appropriate.  2. Uterine fibroid noted.     2016 Abdominal US  -negative   2018 Abd [place]  --normal.    2019 RUQ [place] --normal.    Oct 2021 UGI series IMPRESSION: 1. Small sliding-type hiatal hernia with episodes of spontaneous GE reflux. 2. Normal appearance of the stomach and duodenum.   CTAP w/ contrast 2021 IMPRESSION: Normal CT scan of the abdomen and pelvis. No abnormality seen to explain right lower quadrant pain. Normal appendix.   CTAP in 04/2020  Normal   ACBE in 08/2020  unremarkable.    Abd [place] in 08/2020 --normal except for a borderline dilated common bile duct at 6-7 mm.     Previous GI Evaluation  none  Labs:     Latest Ref Rng & Units 01/21/2021    3:25 PM 09/05/2020    6:56 AM 09/04/2020   10:21 AM  CBC  WBC 3.4 - 10.8 x10E3/uL 8.0  5.1  6.2   Hemoglobin 11.1 - 15.9 g/dL 13.3  13.1  13.6   Hematocrit 34.0 - 46.6 % 40.3  40.6  42.7   Platelets 150 - 450 x10E3/uL 251  246  190        Latest Ref Rng & Units 01/21/2021    3:25 PM 09/05/2020    6:56 AM 09/04/2020   10:21 AM  Hepatic Function  Total Protein 6.0 - 8.5 g/dL 7.3  7.9  8.0   Albumin 3.8 - 4.8 g/dL 4.6  4.5  4.4   AST 0 - 40 IU/L '12  15  19   ' ALT 0 - 32 IU/L '12  14  16   ' Alk Phosphatase 44 - 121 IU/L 97  64  73   Total Bilirubin 0.0 - 1.2 mg/dL <0.2  0.5  0.7      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     Current Outpatient Medications  Medication Sig Dispense Refill   amitriptyline (Sherri [name]) 25 MG tablet Take 2 tablets (50 mg total) by mouth at bedtime. 60 tablet 2   azelastine (ASTELIN) 0.1 % nasal spray Place 2 sprays into both nostrils 2 (two) times daily. 30 mL 0   buPROPion (Sherri [name] XL) 150 MG 24 hr tablet Take  1 tablet (150 mg total) by mouth daily. 30 tablet 1   cetirizine (ZYRTEC ALLERGY) 10 MG tablet Take 1 tablet (10 mg total) by mouth daily. 30 tablet 11   Cholecalciferol (VITAMIN D3) 125 MCG (5000 UT) CAPS Take 1 capsule by mouth daily.     clindamycin (CLEOCIN T) 1 % lotion Apply to cleansed face daily in the morning. 60 mL 1   cyclobenzaprine (FLEXERIL) 5 MG tablet Take 1 tablet (5 mg total) by mouth at bedtime as needed for muscle spasms. 30 tablet 0   Ferrous Sulfate (IRON) 28 MG TABS Take 1 tablet by mouth daily.     fluticasone (FLONASE) 50 MCG/ACT nasal spray Place 2 sprays into both nostrils daily. 16 g 0   Sherri [name] Sulfate SL (LEVSIN/SL) 0.125 MG SUBL Place 0.125 mg under the tongue 2 (two) times daily as needed. 30 tablet 0   ibuprofen (ADVIL) 600 MG tablet Take 1 tablet (600 mg total) by mouth every 8 (eight) hours as needed. 30 tablet 0   meloxicam (MOBIC) 7.5 MG tablet Take 1 tablet (7.5 mg total) by mouth daily. 30 tablet 0   metFORMIN (GLUCOPHAGE-XR) 500 MG 24 hr tablet Take 1 tablet (500 mg total) by mouth daily with breakfast. 30 tablet 1   mirtazapine (Sherri [name]) 15 MG tablet TAKE 2 TABLETS (30 MG TOTAL) BY MOUTH AT BEDTIME. 30 tablet 1   ondansetron (Sherri [name]-ODT) 4 MG disintegrating tablet Dissolve 1 tablet (4 mg total) by mouth every 8 (eight) hours as needed for nausea or vomiting. 30 tablet 1   Sherri [name] (PROTONIX) 40 MG tablet TAKE 1 TABLET (40 MG TOTAL) BY MOUTH DAILY. 30  tablet 0   sulfamethoxazole-trimethoprim (BACTRIM DS) 800-160 MG tablet Take 1 tablet by mouth 2 (two) times daily. 20 tablet 0   tretinoin (RETIN-A) 0.025 % cream Apply pea size amount to cleansed face every other day at bedtime. Increase to every night as tolerated. 20 g 0   triamcinolone cream (KENALOG) 0.1 % Apply to affected areas twice daily for 3 days on and 3 days off cycle or as needed. 80 g 0   No current facility-administered medications for this visit.    Review of Systems: No chest pain. No shortness of breath. No urinary complaints.    Physical Exam  Wt Readings from Last 3 Encounters:  09/05/21 187 lb (84.8 kg)  07/22/21 195 lb (88.5 kg)  07/02/21 195 lb 6 oz (88.6 kg)   BP 126/78,HR 97.  LMP 08/17/2021 (Exact Date)  Constitutional:  Generally well appearing female in no acute distress. Psychiatric: Pleasant. Normal mood and affect. Behavior is normal. EENT: Pupils normal.  Conjunctivae are normal. No scleral icterus. Neck supple.  Cardiovascular: Normal rate, regular rhythm. No edema Pulmonary/chest: Effort normal and breath sounds normal. No wheezing, rales or rhonchi. Abdominal: Soft, nondistended, nontender. Bowel sounds active throughout. There are no masses palpable. No hepatomegaly. Neurological: Alert and oriented to person place and time. Skin: Skin is warm and dry. No rashes noted.  Tye Savoy, NP  09/18/2021, 8:35 AM

## 2021-09-19 NOTE — Telephone Encounter (Signed)
See 7/20 cbc result note.

## 2021-09-22 ENCOUNTER — Emergency Department (HOSPITAL_COMMUNITY)
Admission: EM | Admit: 2021-09-22 | Discharge: 2021-09-22 | Disposition: A | Discharge status: Home / Self Care | Payer: Self-pay | Attending: Emergency Medicine | Admitting: Emergency Medicine

## 2021-09-22 ENCOUNTER — Emergency Department (HOSPITAL_COMMUNITY): Payer: Self-pay

## 2021-09-22 ENCOUNTER — Emergency Department (HOSPITAL_BASED_OUTPATIENT_CLINIC_OR_DEPARTMENT_OTHER): Payer: Self-pay

## 2021-09-22 ENCOUNTER — Other Ambulatory Visit: Payer: Self-pay

## 2021-09-22 ENCOUNTER — Encounter (HOSPITAL_COMMUNITY): Payer: Self-pay

## 2021-09-22 DIAGNOSIS — M25572 Pain in left ankle and joints of left foot: Secondary | ICD-10-CM

## 2021-09-22 DIAGNOSIS — M79662 Pain in left lower leg: Secondary | ICD-10-CM

## 2021-09-22 DIAGNOSIS — M79605 Pain in left leg: Secondary | ICD-10-CM

## 2021-09-22 DIAGNOSIS — M722 Plantar fascial fibromatosis: Secondary | ICD-10-CM | POA: Insufficient documentation

## 2021-09-22 DIAGNOSIS — M79672 Pain in left foot: Secondary | ICD-10-CM

## 2021-09-22 MED ORDER — HYDROCODONE-ACETAMINOPHEN 5-325 MG PO TABS
1.0000 | ORAL_TABLET | Freq: Once | ORAL | Status: AC
  Administered 2021-09-22: 1 via ORAL
  Filled 2021-09-22: qty 1

## 2021-09-22 MED ORDER — NAPROXEN 500 MG PO TABS
500.0000 mg | ORAL_TABLET | Freq: Two times a day (BID) | ORAL | 0 refills | Status: DC
  Filled 2021-09-22: qty 30, 15d supply, fill #0

## 2021-09-22 NOTE — ED Provider Notes (Signed)
[facility] DEPT Provider Note   CSN: [id number] Arrival date & time: 09/22/21  9417     History {Add pertinent medical, surgical, social history, OB history to HPI:1} Chief Complaint  Patient presents with   Leg Pain    Sherri [name] is a 36 y.o. female with history of plantar fasciitis presenting today with left foot, ankle, and calf pain.  Foot and ankle pain over the last 6 to 7 years, being monitored through [facility].  Has been recommended/offered to undergo surgical intervention, for which the patient is unable to elaborate further.  Recently had another steroid injection 2 weeks ago, started noticing calf pain about 24 hours later.  Calf pain has been increasing and is exquisitely tender to the touch.  Also difficulty/pain with left ankle range of motion, denies dysfunction of the right.  Denies recent fevers, shortness of breath, recent surgeries, hemoptysis, IVDU.  Denies redness or warmth of the area, however does note some increased swelling.  Not on anticoagulation or estrogenic medications.  Per records 06/02/2018 diagnosed and treated for "Acute plantar fasciitis bilateral with inflammation fluid buildup".  The history is provided by the patient and medical records.  Leg Pain      Home Medications Prior to Admission medications   Medication Sig Start Date End Date Taking? Authorizing Provider  amitriptyline (ELAVIL) 25 MG tablet Take 2 tablets (50 mg total) by mouth at bedtime. 06/24/21 09/22/21  Mayers, Cari S, PA-C  azelastine (ASTELIN) 0.1 % nasal spray Place 2 sprays into both nostrils 2 (two) times daily. 02/10/20   Tasia Catchings, Amy V, PA-C  buPROPion (WELLBUTRIN XL) 150 MG 24 hr tablet Take 1 tablet (150 mg total) by mouth daily. Patient not taking: Reported on 09/18/2021 07/22/21   Mayers, Cari S, PA-C  cetirizine (ZYRTEC ALLERGY) 10 MG tablet Take 1 tablet (10 mg total) by mouth daily. 12/31/20   Camillia Herter, NP  Cholecalciferol  (VITAMIN D3) 125 MCG (5000 UT) CAPS Take 1 capsule by mouth daily.    [provider]  clindamycin (CLEOCIN T) 1 % lotion Apply to cleansed face daily in the morning. 06/30/21     cyclobenzaprine (FLEXERIL) 5 MG tablet Take 1 tablet (5 mg total) by mouth at bedtime as needed for muscle spasms. 04/08/21   Camillia Herter, NP  Ferrous Sulfate (IRON) 28 MG TABS Take 1 tablet by mouth daily.    [provider]  fluticasone (FLONASE) 50 MCG/ACT nasal spray Place 2 sprays into both nostrils daily. 11/26/20   Camillia Herter, NP  Hyoscyamine Sulfate SL (LEVSIN/SL) 0.125 MG SUBL Place 0.125 mg under the tongue 2 (two) times daily as needed. Patient not taking: Reported on 09/18/2021 12/25/20   Raspet, Derry Skill, PA-C  ibuprofen (ADVIL) 600 MG tablet Take 1 tablet (600 mg total) by mouth every 8 (eight) hours as needed. 12/31/20   Camillia Herter, NP  meloxicam (MOBIC) 7.5 MG tablet Take 1 tablet (7.5 mg total) by mouth daily. 04/08/21   Camillia Herter, NP  metFORMIN (GLUCOPHAGE-XR) 500 MG 24 hr tablet Take 1 tablet (500 mg total) by mouth daily with breakfast. Patient not taking: Reported on 09/18/2021 06/24/21   Mayers, Cari S, PA-C  mirtazapine (REMERON) 15 MG tablet TAKE 2 TABLETS (30 MG TOTAL) BY MOUTH AT BEDTIME. Patient not taking: Reported on 09/18/2021 04/26/20 07/02/21  Zehr, Janett Billow D, PA-C  ondansetron (ZOFRAN-ODT) 4 MG disintegrating tablet Dissolve 1 tablet (4 mg total) by mouth  every 8 (eight) hours as needed for nausea or vomiting. 07/10/21   Willia Craze, NP  pantoprazole (PROTONIX) 40 MG tablet TAKE 1 TABLET (40 MG TOTAL) BY MOUTH DAILY. 08/06/21 08/06/22  Zehr, Laban Emperor, PA-C  Prucalopride Succinate (MOTEGRITY) 2 MG TABS Take 1 tablet (2 mg total) by mouth daily. 09/18/21   Willia Craze, NP  tretinoin (RETIN-A) 0.025 % cream Apply pea size amount to cleansed face every other day at bedtime. Increase to every night as tolerated. 06/30/21     triamcinolone cream (KENALOG) 0.1 % Apply to  affected areas twice daily for 3 days on and 3 days off cycle or as needed. 06/30/21         Allergies    Macrobid [nitrofurantoin], Augmentin [amoxicillin-pot clavulanate], and Metformin and related    Review of Systems   Review of Systems  Musculoskeletal:        Left foot, ankle, and calf pain and swelling    Physical Exam Updated Vital Signs BP 140/83   Pulse 69   Temp 98.4 F (36.9 C) (Oral)   Resp 18   SpO2 98%  Physical Exam Vitals and nursing note reviewed.  Constitutional:      General: She is not in acute distress.    Appearance: She is well-developed. She is not ill-appearing, toxic-appearing or diaphoretic.  HENT:     Head: Normocephalic and atraumatic.  Eyes:     Conjunctiva/sclera: Conjunctivae normal.  Neck:     Comments: Very supple on exam Cardiovascular:     Rate and Rhythm: Normal rate and regular rhythm.     Pulses: Normal pulses.     Heart sounds: No murmur heard.    Comments: DP PT pulses 2+ bilaterally.  CRT less than 2 seconds. Pulmonary:     Effort: Pulmonary effort is normal. No respiratory distress.     Breath sounds: Normal breath sounds.  Abdominal:     Palpations: Abdomen is soft.     Tenderness: There is no abdominal tenderness.  Musculoskeletal:        General: No swelling.     Cervical back: Neck supple. No rigidity.     Comments: Mildly reduced AROM and PROM of left ankle and knee due to elicited tenderness.  Left calf, ankle, and foot with exquisite TTP, without specific bony TTP.  Areas without warmth, erythema, or significant swelling.  Without obvious deformity, malalignment, malrotation, or shortening.  Skin:    General: Skin is warm and dry.     Capillary Refill: Capillary refill takes less than 2 seconds.  Neurological:     Mental Status: She is alert and oriented to person, place, and time.  Psychiatric:        Mood and Affect: Mood normal.     ED Results / Procedures / Treatments   Labs (all labs ordered are listed,  but only abnormal results are displayed) Labs Reviewed - No data to display  EKG None  Radiology No results found.  Procedures Procedures  {Document cardiac monitor, telemetry assessment procedure when appropriate:1}  Medications Ordered in ED Medications - No data to display  ED Course/ Medical Decision Making/ A&P                           Medical Decision Making  36 y.o. female presents to the ED for concern of Leg Pain   This involves an extensive number of treatment options, and is a complaint that  carries with it a high risk of complications and morbidity.  The emergent differential diagnosis prior to evaluation includes, but is not limited to: Planter fasciitis with associated fluid buildup, DVT, sprain  This is not an exhaustive differential.   Past Medical History / Co-morbidities / Social History: Hx of plantar fasciitis Social Determinants of Health include: None  Additional History:  Internal and external records from outside source obtained and reviewed including [facility], which indicates history of planter fasciitis with associated fluid buildup and recurrent steroid injections  Lab Tests: None  Imaging Studies: I ordered imaging studies including US Doppler Left LE and XR left ankle/foot .   I independently visualized and interpreted imaging which showed US Doppler Left LE:  XR left ankle:  XR left foot I agree with the radiologist interpretation.  ED Course: Pt well-appearing on exam.  Nontoxic, nonseptic appearing in NAD.  Presenting with chronic and worsening left foot and ankle pain.  Also presenting with new left calf mild swelling and tenderness.  Clinical picture not suggestive of septic arthritis, gout, or compartment syndrome.  No warmth or erythema, not on estrogenic birth control means, no history of DVT or PE.  However due to exquisite tenderness, recent steroid injection of the ankle/foot, plan to assess/rule out DVT.    Patient  in NAD and in good condition at time of discharge.  Disposition: After consideration the patient's encounter today, I do not feel today's workup suggests an emergent condition requiring admission or immediate intervention beyond what has been performed at this time.  Safe for discharge; instructed to return immediately for worsening symptoms, change in symptoms or any other concerns.  I have reviewed the patients home medicines and have made adjustments as needed.  Discussed course of treatment with the patient, whom demonstrated understanding.  Patient in agreement and has no further questions.    I discussed this case with my attending physician Dr. Regenia Skeeter, who agreed with the proposed treatment course and cosigned this note including patient's presenting symptoms, physical exam, and planned diagnostics and interventions.  Attending physician stated agreement with plan or made changes to plan which were implemented.     This chart was dictated using voice recognition software.  Despite best efforts to proofread, errors can occur which can change the documentation meaning.   {Document critical care time when appropriate:1} {Document review of labs and clinical decision tools ie heart score, Chads2Vasc2 etc:1}  {Document your independent review of radiology images, and any outside records:1} {Document your discussion with family members, caretakers, and with consultants:1} {Document social determinants of health affecting pt's care:1} {Document your decision making why or why not admission, treatments were needed:1} Final Clinical Impression(s) / ED Diagnoses Final diagnoses:  None    Rx / DC Orders ED Discharge Orders     None

## 2021-09-22 NOTE — Discharge Instructions (Addendum)
Please follow-up with your foot/ankle specialist Dr. Paulla Dolly within the next few days for reevaluation continue medical management.  Continue using methods discussed today for symptom management until able to follow-up with your foot specialist.  You have been prescribed an anti-inflammatory by the name of naproxen.  You may take 1 tablet every 12 hours as needed for pain relief.  Always take with plenty of food and water.  Do not take ibuprofen or Motrin on top of this, stay on same medication family.  Though you may take Tylenol in addition to the naproxen.  Return to the ED for new or worsening symptoms as discussed.

## 2021-09-22 NOTE — Progress Notes (Signed)
Left lower extremity venous duplex has been completed. Preliminary results can be found in CV Proc through chart review.  Results were given to Dorise Bullion PA.  09/22/21 8:29 AM Carlos Levering RVT

## 2021-09-22 NOTE — ED Triage Notes (Signed)
Reports left lower extremity pain worst in the calf area x 1 week. Pain with flexion and extension. Ambulatory.

## 2021-09-24 ENCOUNTER — Other Ambulatory Visit: Payer: Self-pay

## 2021-09-25 ENCOUNTER — Other Ambulatory Visit: Payer: Self-pay

## 2021-09-27 ENCOUNTER — Encounter (HOSPITAL_COMMUNITY): Payer: Self-pay

## 2021-09-27 ENCOUNTER — Emergency Department (HOSPITAL_COMMUNITY): Payer: Self-pay

## 2021-09-27 ENCOUNTER — Other Ambulatory Visit: Payer: Self-pay

## 2021-09-27 ENCOUNTER — Emergency Department (HOSPITAL_COMMUNITY)
Admission: EM | Admit: 2021-09-27 | Discharge: 2021-09-27 | Disposition: A | Discharge status: Home / Self Care | Payer: Self-pay | Attending: Emergency Medicine | Admitting: Emergency Medicine

## 2021-09-27 DIAGNOSIS — R112 Nausea with vomiting, unspecified: Secondary | ICD-10-CM | POA: Insufficient documentation

## 2021-09-27 DIAGNOSIS — R1012 Left upper quadrant pain: Secondary | ICD-10-CM | POA: Insufficient documentation

## 2021-09-27 DIAGNOSIS — R1084 Generalized abdominal pain: Secondary | ICD-10-CM | POA: Insufficient documentation

## 2021-09-27 DIAGNOSIS — R197 Diarrhea, unspecified: Secondary | ICD-10-CM | POA: Insufficient documentation

## 2021-09-27 DIAGNOSIS — R1013 Epigastric pain: Secondary | ICD-10-CM | POA: Insufficient documentation

## 2021-09-27 DIAGNOSIS — Z7984 Long term (current) use of oral hypoglycemic drugs: Secondary | ICD-10-CM | POA: Insufficient documentation

## 2021-09-27 DIAGNOSIS — R1011 Right upper quadrant pain: Secondary | ICD-10-CM | POA: Insufficient documentation

## 2021-09-27 LAB — CBC
HCT: 39.5 % (ref 36.0–46.0)
Hemoglobin: 12.9 g/dL (ref 12.0–15.0)
MCH: 28.9 pg (ref 26.0–34.0)
MCHC: 32.7 g/dL (ref 30.0–36.0)
MCV: 88.4 fL (ref 80.0–100.0)
Platelets: 260 10*3/uL (ref 150–400)
RBC: 4.47 MIL/uL (ref 3.87–5.11)
RDW: 16 % — ABNORMAL HIGH (ref 11.5–15.5)
WBC: 5.9 10*3/uL (ref 4.0–10.5)
nRBC: 0 % (ref 0.0–0.2)

## 2021-09-27 LAB — COMPREHENSIVE METABOLIC PANEL
ALT: 15 U/L (ref 0–44)
AST: 16 U/L (ref 15–41)
Albumin: 4.1 g/dL (ref 3.5–5.0)
Alkaline Phosphatase: 69 U/L (ref 38–126)
Anion gap: 6 (ref 5–15)
BUN: 10 mg/dL (ref 6–20)
CO2: 25 mmol/L (ref 22–32)
Calcium: 9.4 mg/dL (ref 8.9–10.3)
Chloride: 109 mmol/L (ref 98–111)
Creatinine, Ser: 0.89 mg/dL (ref 0.44–1.00)
GFR, Estimated: >60 mL/min (ref >60)
Glucose, Bld: 107 mg/dL — ABNORMAL HIGH (ref 70–99)
Potassium: 3.5 mmol/L (ref 3.5–5.1)
Sodium: 140 mmol/L (ref 135–145)
Total Bilirubin: 0.4 mg/dL (ref 0.3–1.2)
Total Protein: 7.7 g/dL (ref 6.5–8.1)

## 2021-09-27 LAB — URINALYSIS, ROUTINE W REFLEX MICROSCOPIC
Bilirubin Urine: NEGATIVE
Glucose, UA: NEGATIVE mg/dL
Hgb urine dipstick: NEGATIVE
Ketones, ur: NEGATIVE mg/dL
Leukocytes,Ua: NEGATIVE
Nitrite: NEGATIVE
Protein, ur: NEGATIVE mg/dL
Specific Gravity, Urine: 1.018 (ref 1.005–1.030)
pH: 5 (ref 5.0–8.0)

## 2021-09-27 LAB — PREGNANCY, URINE: Preg Test, Ur: NEGATIVE

## 2021-09-27 LAB — LIPASE, BLOOD: Lipase: 30 U/L (ref 11–51)

## 2021-09-27 MED ORDER — FAMOTIDINE 20 MG PO TABS
20.0000 mg | ORAL_TABLET | Freq: Two times a day (BID) | ORAL | 0 refills | Status: DC
  Filled 2021-09-27: qty 30, 15d supply, fill #0

## 2021-09-27 MED ORDER — KETOROLAC TROMETHAMINE 15 MG/ML IJ SOLN
15.0000 mg | Freq: Once | INTRAMUSCULAR | Status: AC
  Administered 2021-09-27: 15 mg via INTRAVENOUS
  Filled 2021-09-27: qty 1

## 2021-09-27 MED ORDER — ONDANSETRON 4 MG PO TBDP
4.0000 mg | ORAL_TABLET | Freq: Three times a day (TID) | ORAL | 0 refills | Status: DC | PRN
  Filled 2021-09-27 – 2021-10-09 (×2): qty 9, 15d supply, fill #0
  Filled 2021-10-16 – 2021-11-02 (×4): qty 9, 15d supply, fill #1

## 2021-09-27 MED ORDER — SUCRALFATE 1 G PO TABS
1.0000 g | ORAL_TABLET | Freq: Three times a day (TID) | ORAL | 0 refills | Status: DC
  Filled 2021-09-27: qty 60, 15d supply, fill #0

## 2021-09-27 MED ORDER — POLYETHYLENE GLYCOL 3350 17 GM/SCOOP PO POWD
17.0000 g | Freq: Every day | ORAL | 0 refills | Status: DC | PRN
  Filled 2021-09-27: qty 238, 14d supply, fill #0

## 2021-09-27 MED ORDER — ONDANSETRON HCL 4 MG/2ML IJ SOLN
4.0000 mg | Freq: Once | INTRAMUSCULAR | Status: AC
Start: 2021-09-27 — End: 2021-09-27
  Administered 2021-09-27: 4 mg via INTRAVENOUS
  Filled 2021-09-27: qty 2

## 2021-09-27 MED ORDER — SODIUM CHLORIDE 0.9 % IV BOLUS
1000.0000 mL | Freq: Once | INTRAVENOUS | Status: AC
  Administered 2021-09-27: 1000 mL via INTRAVENOUS

## 2021-09-27 MED ORDER — SODIUM CHLORIDE (PF) 0.9 % IJ SOLN
INTRAMUSCULAR | Status: AC
  Filled 2021-09-27: qty 50

## 2021-09-27 MED ORDER — IOHEXOL 300 MG/ML  SOLN
100.0000 mL | Freq: Once | INTRAMUSCULAR | Status: AC | PRN
  Administered 2021-09-27: 100 mL via INTRAVENOUS

## 2021-09-27 NOTE — ED Provider Notes (Signed)
[facility] DEPT Provider Note   CSN: [id number] Arrival date & time: 09/27/21  0931     History  Chief Complaint  Patient presents with   Abdominal Pain    Sherri [name] is a 36 y.o. female.  Patient with history of cesarean section no other abdominal surgeries presents to the emergency department for evaluation of abdominal pain.  She states that her symptoms started about 5 days ago.  She describes upper abdominal pain, bilaterally with associated nausea.  She had vomiting this morning as well as some diarrhea.  No dysuria, increased frequency or urgency, hematuria.  Pain radiates to the upper back bilaterally.  No heavy alcohol or NSAID use reported.  No treatments prior to arrival.       Home Medications Prior to Admission medications   Medication Sig Start Date End Date Taking? Authorizing Provider  amitriptyline (ELAVIL) 25 MG tablet Take 2 tablets (50 mg total) by mouth at bedtime. 06/24/21 09/22/21  Mayers, Cari S, PA-C  azelastine (ASTELIN) 0.1 % nasal spray Place 2 sprays into both nostrils 2 (two) times daily. 02/10/20   Tasia Catchings, Amy V, PA-C  buPROPion (WELLBUTRIN XL) 150 MG 24 hr tablet Take 1 tablet (150 mg total) by mouth daily. Patient not taking: Reported on 09/18/2021 07/22/21   Mayers, Cari S, PA-C  cetirizine (ZYRTEC ALLERGY) 10 MG tablet Take 1 tablet (10 mg total) by mouth daily. 12/31/20   Camillia Herter, NP  Cholecalciferol (VITAMIN D3) 125 MCG (5000 UT) CAPS Take 1 capsule by mouth daily.    [provider]  clindamycin (CLEOCIN T) 1 % lotion Apply to cleansed face daily in the morning. 06/30/21     cyclobenzaprine (FLEXERIL) 5 MG tablet Take 1 tablet (5 mg total) by mouth at bedtime as needed for muscle spasms. 04/08/21   Camillia Herter, NP  Ferrous Sulfate (IRON) 28 MG TABS Take 1 tablet by mouth daily.    [provider]  fluticasone (FLONASE) 50 MCG/ACT nasal spray Place 2 sprays into both nostrils daily. 11/26/20    Camillia Herter, NP  Hyoscyamine Sulfate SL (LEVSIN/SL) 0.125 MG SUBL Place 0.125 mg under the tongue 2 (two) times daily as needed. Patient not taking: Reported on 09/18/2021 12/25/20   Raspet, Derry Skill, PA-C  ibuprofen (ADVIL) 600 MG tablet Take 1 tablet (600 mg total) by mouth every 8 (eight) hours as needed. 12/31/20   Camillia Herter, NP  metFORMIN (GLUCOPHAGE-XR) 500 MG 24 hr tablet Take 1 tablet (500 mg total) by mouth daily with breakfast. Patient not taking: Reported on 09/18/2021 06/24/21   Mayers, Cari S, PA-C  mirtazapine (REMERON) 15 MG tablet TAKE 2 TABLETS (30 MG TOTAL) BY MOUTH AT BEDTIME. Patient not taking: Reported on 09/18/2021 04/26/20 07/02/21  Zehr, Janett Billow D, PA-C  naproxen (NAPROSYN) 500 MG tablet Take 1 tablet (500 mg total) by mouth 2 (two) times daily. 3/55/97   Prince Rome, PA-C  ondansetron (ZOFRAN-ODT) 4 MG disintegrating tablet Dissolve 1 tablet (4 mg total) by mouth every 8 (eight) hours as needed for nausea or vomiting. 07/10/21   Willia Craze, NP  pantoprazole (PROTONIX) 40 MG tablet TAKE 1 TABLET (40 MG TOTAL) BY MOUTH DAILY. 08/06/21 08/06/22  Zehr, Laban Emperor, PA-C  Prucalopride Succinate (MOTEGRITY) 2 MG TABS Take 1 tablet (2 mg total) by mouth daily. 09/18/21   Willia Craze, NP  tretinoin (RETIN-A) 0.025 % cream Apply pea size amount to cleansed face every other day  at bedtime. Increase to every night as tolerated. 06/30/21     triamcinolone cream (KENALOG) 0.1 % Apply to affected areas twice daily for 3 days on and 3 days off cycle or as needed. 06/30/21         Allergies    Macrobid [nitrofurantoin], Augmentin [amoxicillin-pot clavulanate], and Metformin and related    Review of Systems   Review of Systems  Physical Exam Updated Vital Signs BP 140/87 (BP Location: Left Arm)   Pulse 71   Temp 98.5 F (36.9 C) (Oral)   Resp 17   Ht '4\' 11"'$  (1.499 m)   Wt 84.8 kg   SpO2 100%   BMI 37.77 kg/m   Physical Exam Vitals and nursing note reviewed.   Constitutional:      General: She is not in acute distress.    Appearance: She is well-developed.  HENT:     Head: Normocephalic and atraumatic.     Right Ear: External ear normal.     Left Ear: External ear normal.     Nose: Nose normal.  Eyes:     Conjunctiva/sclera: Conjunctivae normal.  Cardiovascular:     Rate and Rhythm: Normal rate and regular rhythm.     Heart sounds: No murmur heard. Pulmonary:     Effort: No respiratory distress.     Breath sounds: No wheezing, rhonchi or rales.  Abdominal:     Palpations: Abdomen is soft.     Tenderness: There is abdominal tenderness in the right upper quadrant, epigastric area, left upper quadrant and left lower quadrant. There is no guarding or rebound. Negative signs include Murphy's sign and McBurney's sign.  Musculoskeletal:     Cervical back: Normal range of motion and neck supple.     Right lower leg: No edema.     Left lower leg: No edema.  Skin:    General: Skin is warm and dry.     Findings: No rash.  Neurological:     General: No focal deficit present.     Mental Status: She is alert. Mental status is at baseline.     Motor: No weakness.  Psychiatric:        Mood and Affect: Mood normal.     ED Results / Procedures / Treatments   Labs (all labs ordered are listed, but only abnormal results are displayed) Labs Reviewed  COMPREHENSIVE METABOLIC PANEL - Abnormal; Notable for the following components:      Result Value   Glucose, Bld 107 (*)    All other components within normal limits  CBC - Abnormal; Notable for the following components:   RDW 16.0 (*)    All other components within normal limits  URINALYSIS, ROUTINE W REFLEX MICROSCOPIC - Abnormal; Notable for the following components:   APPearance HAZY (*)    All other components within normal limits  LIPASE, BLOOD  PREGNANCY, URINE    EKG None  Radiology CT ABDOMEN PELVIS W CONTRAST  Result Date: 09/27/2021 CLINICAL DATA:  Abdominal pain, acute,  nonlocalized bilateral upper abd pain with N/V, worsening over 5 days EXAM: CT ABDOMEN AND PELVIS WITH CONTRAST TECHNIQUE: Multidetector CT imaging of the abdomen and pelvis was performed using the standard protocol following bolus administration of intravenous contrast. RADIATION DOSE REDUCTION: This exam was performed according to the departmental dose-optimization program which includes automated exposure control, adjustment of the mA and/or kV according to patient size and/or use of iterative reconstruction technique. CONTRAST:  167m OMNIPAQUE IOHEXOL 300 MG/ML  SOLN  COMPARISON:  04/22/2020 FINDINGS: Lower chest: No acute abnormality. Hepatobiliary: No focal liver abnormality is seen. No gallstones, gallbladder wall thickening, or biliary dilatation. Pancreas: Unremarkable. No pancreatic ductal dilatation or surrounding inflammatory changes. Spleen: Normal in size without focal abnormality. Adrenals/Urinary Tract: Unremarkable adrenal glands. Kidneys enhance symmetrically without focal lesion, stone, or hydronephrosis. Ureters are nondilated. Urinary bladder appears unremarkable. Stomach/Bowel: Stomach is within normal limits. Appendix appears normal. No evidence of bowel wall thickening, distention, or inflammatory changes. Vascular/Lymphatic: No significant vascular findings are present. No enlarged abdominal or pelvic lymph nodes. Reproductive: Anteverted uterus with probable arcuate configuration. No adnexal masses. Other: No free fluid. No abdominopelvic fluid collection. No pneumoperitoneum. No abdominal wall hernia. Musculoskeletal: No acute or significant osseous findings. IMPRESSION: No acute abdominopelvic findings. Electronically Signed   By: Davina Poke D.O.   On: 09/27/2021 12:25    Procedures Procedures    Medications Ordered in ED Medications  sodium chloride (PF) 0.9 % injection (has no administration in time range)  ketorolac (TORADOL) 15 MG/ML injection 15 mg (has no  administration in time range)  sodium chloride 0.9 % bolus 1,000 mL (1,000 mLs Intravenous New Bag/Given 09/27/21 1154)  ondansetron (ZOFRAN) injection 4 mg (4 mg Intravenous Given 09/27/21 1154)  iohexol (OMNIPAQUE) 300 MG/ML solution 100 mL (100 mLs Intravenous Contrast Given 09/27/21 1204)    ED Course/ Medical Decision Making/ A&P    Patient seen and examined. History obtained directly from patient.   Labs/EKG: Ordered CBC, CMP, lipase, UA, pregnancy testing.  Imaging: None ordered  Medications/Fluids: Ordered: IV fluid bolus, IV Zofran.  Most recent vital signs reviewed and are as follows: BP 140/87 (BP Location: Left Arm)   Pulse 71   Temp 98.5 F (36.9 C) (Oral)   Resp 17   Ht '4\' 11"'$  (1.499 m)   Wt 84.8 kg   SpO2 100%   BMI 37.77 kg/m   Initial impression: Generalized abdominal pain with vomiting and diarrhea.  11:56 AM Reassessment performed. Patient appears stable.  Just now receiving Zofran and IV fluids.  Labs personally reviewed and interpreted including: CBC unremarkable, CMP unremarkable, lipase normal, UA without signs of infection  Reviewed pertinent lab work with patient at bedside. Questions answered.  We had shared decision-making discussion on how to proceed.  Discussed that it would be reasonable to treat symptoms and have her follow-up for recheck with her PCP in a few days or we could proceed with imaging of the abdomen with CT scan given bilateral nature of pain to rule out other potential etiologies.  After discussion of risks and benefits, patient states that she would prefer to have imaging today.  CT abdomen pelvis ordered.  Most current vital signs reviewed and are as follows: BP 133/87   Pulse 66   Temp 98.5 F (36.9 C) (Oral)   Resp 18   Ht '4\' 11"'$  (1.499 m)   Wt 84.8 kg   SpO2 98%   BMI 37.77 kg/m   Plan: Continuing treatments and will obtain CT imaging of the abdomen pelvis.  12:42 PM   Personally reviewed and interpreted CT imaging of  the abdomen pelvis, agree negative for acute abnormalities.  She does have a fair amount of stool.  1:21 PM Reassessment performed. Patient appears comfortable.  Complaining of some sore throat and that her mouth is dry.  Reviewed pertinent lab work and imaging with patient at bedside. Questions answered.   Most current vital signs reviewed and are as follows: BP 133/87   Pulse  66   Temp 98.5 F (36.9 C) (Oral)   Resp 18   Ht '4\' 11"'$  (1.499 m)   Wt 84.8 kg   SpO2 98%   BMI 37.77 kg/m   Plan: Discharge to home.   Prescriptions written for: For abdominal pain will treat with Pepcid and Carafate, Zofran for nausea, MiraLAX to use if she is not passing stool.  Other home care instructions discussed: Criss Rosales diet, maintain good hydration  ED return instructions discussed: The patient was urged to return to the Emergency Department immediately with worsening of current symptoms, worsening abdominal pain, persistent vomiting, blood noted in stools, fever, or any other concerns. The patient verbalized understanding.                           Medical Decision Making Amount and/or Complexity of Data Reviewed Labs: ordered. Radiology: ordered.  Risk Prescription drug management.   For this patient's complaint of abdominal pain, the following conditions were considered on the differential diagnosis: gastritis/PUD, enteritis/duodenitis, appendicitis, cholelithiasis/cholecystitis, cholangitis, pancreatitis, ruptured viscus, colitis, diverticulitis, small/large bowel obstruction, proctitis, cystitis, pyelonephritis, ureteral colic, aortic dissection, aortic aneurysm. In women, ectopic pregnancy, pelvic inflammatory disease, ovarian cysts, and tubo-ovarian abscess were also considered. Atypical chest etiologies were also considered including ACS, PE, and pneumonia.   The patient's vital signs, pertinent lab work and imaging were reviewed and interpreted as discussed in the ED course.  Hospitalization was considered for further testing, treatments, or serial exams/observation. However as patient is well-appearing, has a stable exam, and reassuring studies today, I do not feel that they warrant admission at this time. This plan was discussed with the patient who verbalizes agreement and comfort with this plan and seems reliable and able to return to the Emergency Department with worsening or changing symptoms.          Final Clinical Impression(s) / ED Diagnoses Final diagnoses:  Generalized abdominal pain  Nausea and vomiting, unspecified vomiting type    Rx / DC Orders ED Discharge Orders     None         Carlisle Cater, PA-C 09/27/21 1328    Wyvonnia Dusky, MD 09/27/21 1418

## 2021-09-27 NOTE — ED Triage Notes (Signed)
"  Abdominal pain on both sides of abdomen since Monday and upper back pain as well, vomited x 1 this morning and had a little diarrhea this morning" per pt  Denies dysuria

## 2021-09-27 NOTE — Discharge Instructions (Signed)
Please read and follow all provided instructions.  Your diagnoses today include:  1. Generalized abdominal pain   2. Nausea and vomiting, unspecified vomiting type     Tests performed today include: Blood cell counts and platelets: Normal white and red blood cell counts Kidney and liver function tests: Normal kidney function Pancreas function test (called lipase) Urine test to look for infection: No sign of infection A blood or urine test for pregnancy (women only): Negative CT scan of the abdomen pelvis: Did not demonstrate any acute findings which would explain the pain you are having, but fortunately no concerning problems Vital signs. See below for your results today.   Medications prescribed:  Pepcid (famotidine) - antihistamine  You can find this medication over-the-counter.   DO NOT exceed:  '20mg'$  Pepcid every 12 hours  Carafate - for stomach upset and to protect your stomach  Zofran (ondansetron) - for nausea and vomiting  Miralax - laxative  This medication can be found over-the-counter.   Take any prescribed medications only as directed.  Home care instructions:  Follow any educational materials contained in this packet.  Follow-up instructions: Please follow-up with your primary care provider in the next 3 days for further evaluation of your symptoms.    Return instructions:  SEEK IMMEDIATE MEDICAL ATTENTION IF: The pain does not go away or becomes severe  A temperature above 101F develops  Repeated vomiting occurs (multiple episodes)  The pain becomes localized to portions of the abdomen. The right side could possibly be appendicitis. In an adult, the left lower portion of the abdomen could be colitis or diverticulitis.  Blood is being passed in stools or vomit (bright red or black tarry stools)  You develop chest pain, difficulty breathing, dizziness or fainting, or become confused, poorly responsive, or inconsolable (young children) If you have any other  emergent concerns regarding your health  Additional Information: Abdominal (belly) pain can be caused by many things. Your caregiver performed an examination and possibly ordered blood/urine tests and imaging (CT scan, x-rays, ultrasound). Many cases can be observed and treated at home after initial evaluation in the emergency department. Even though you are being discharged home, abdominal pain can be unpredictable. Therefore, you need a repeated exam if your pain does not resolve, returns, or worsens. Most patients with abdominal pain don't have to be admitted to the hospital or have surgery, but serious problems like appendicitis and gallbladder attacks can start out as nonspecific pain. Many abdominal conditions cannot be diagnosed in one visit, so follow-up evaluations are very important.  Your vital signs today were: BP 133/87   Pulse 66   Temp 98.5 F (36.9 C) (Oral)   Resp 18   Ht '4\' 11"'$  (1.499 m)   Wt 84.8 kg   SpO2 98%   BMI 37.77 kg/m  If your blood pressure (bp) was elevated above 135/85 this visit, please have this repeated by your doctor within one month. --------------

## 2021-09-29 ENCOUNTER — Other Ambulatory Visit: Payer: Self-pay

## 2021-09-30 ENCOUNTER — Other Ambulatory Visit: Payer: Self-pay

## 2021-10-09 ENCOUNTER — Other Ambulatory Visit (HOSPITAL_COMMUNITY)
Admission: RE | Admit: 2021-10-09 | Discharge: 2021-10-09 | Disposition: A | Discharge status: Home / Self Care | Payer: Self-pay | Source: Ambulatory Visit | Attending: Obstetrics and Gynecology | Admitting: Obstetrics and Gynecology

## 2021-10-09 ENCOUNTER — Other Ambulatory Visit (HOSPITAL_COMMUNITY): Payer: Self-pay

## 2021-10-09 ENCOUNTER — Encounter: Payer: Self-pay | Admitting: Family Medicine

## 2021-10-09 ENCOUNTER — Other Ambulatory Visit: Payer: Self-pay

## 2021-10-09 ENCOUNTER — Ambulatory Visit (INDEPENDENT_AMBULATORY_CARE_PROVIDER_SITE_OTHER): Payer: Self-pay | Admitting: Family Medicine

## 2021-10-09 VITALS — BP 131/88 | HR 76 | Wt 185.7 lb

## 2021-10-09 DIAGNOSIS — N760 Acute vaginitis: Secondary | ICD-10-CM

## 2021-10-09 DIAGNOSIS — B9689 Other specified bacterial agents as the cause of diseases classified elsewhere: Secondary | ICD-10-CM

## 2021-10-09 DIAGNOSIS — F32A Depression, unspecified: Secondary | ICD-10-CM

## 2021-10-09 DIAGNOSIS — R300 Dysuria: Secondary | ICD-10-CM

## 2021-10-09 DIAGNOSIS — F419 Anxiety disorder, unspecified: Secondary | ICD-10-CM

## 2021-10-09 LAB — POCT URINALYSIS DIP (DEVICE)
Bilirubin Urine: NEGATIVE
Glucose, UA: NEGATIVE mg/dL
Ketones, ur: NEGATIVE mg/dL
Leukocytes,Ua: NEGATIVE
Nitrite: NEGATIVE
Protein, ur: NEGATIVE mg/dL
Specific Gravity, Urine: 1.025 (ref 1.005–1.030)
Urobilinogen, UA: 0.2 mg/dL (ref 0.0–1.0)
pH: 6 (ref 5.0–8.0)

## 2021-10-09 MED ORDER — CLINDAMYCIN PHOSPHATE 2 % VA CREA
1.0000 | TOPICAL_CREAM | Freq: Every day | VAGINAL | 0 refills | Status: DC
  Filled 2021-10-09: qty 40, 7d supply, fill #0

## 2021-10-09 NOTE — Progress Notes (Signed)
cle

## 2021-10-09 NOTE — Progress Notes (Signed)
Sherri [name] is an 36 y.o. female. Patient being seen with complaints of recurrent bacterial vaginosis. Has been seen for this in the past. Does not tolerate oral flagyl well at all. She is without insurance. Does not note any regularity with sx's. Has trouble with fully feeling treated. Has been told to take Boric acid in the past, but hasn't had much success. with new onset of urinary symptoms.   Review of Systems  Constitutional:  Positive for fatigue. Negative for chills and fever.  Genitourinary:  Positive for dysuria, frequency, urgency and vaginal discharge. Negative for flank pain and pelvic pain.    Blood pressure 131/88, pulse 76, weight 185 lb 11.2 oz (84.2 kg), last menstrual period 09/29/2021. Physical Exam Constitutional:      Appearance: Normal appearance.  HENT:     Head: Normocephalic and atraumatic.  Cardiovascular:     Rate and Rhythm: Normal rate and regular rhythm.  Pulmonary:     Effort: Pulmonary effort is normal.  Abdominal:     Palpations: Abdomen is soft.  Musculoskeletal:     Cervical back: Neck supple.  Skin:    General: Skin is warm and dry.  Neurological:     General: No focal deficit present.     Mental Status: She is alert.     Results for orders placed or performed in visit on 10/09/21 (from the past 24 hour(s))  POCT urinalysis dip (device)     Status: Abnormal   Collection Time: 10/09/21  9:21 AM  Result Value Ref Range   Glucose, UA NEGATIVE NEGATIVE mg/dL   Bilirubin Urine NEGATIVE NEGATIVE   Ketones, ur NEGATIVE NEGATIVE mg/dL   Specific Gravity, Urine 1.025 1.005 - 1.030   Hgb urine dipstick TRACE (A) NEGATIVE   pH 6.0 5.0 - 8.0   Protein, ur NEGATIVE NEGATIVE mg/dL   Urobilinogen, UA 0.2 0.0 - 1.0 mg/dL   Nitrite NEGATIVE NEGATIVE   Leukocytes,Ua NEGATIVE NEGATIVE      Assessment/Plan: Bacterial vaginitis - recerrent--rx for Cleocin cream, then use boric acid 2x/wk to keep sx's at bay--vaginal health discussed at length. -  Plan: Cervicovaginal ancillary only( [place]), clindamycin (CLEOCIN) 2 % vaginal cream  Dysuria - Check urine culture and treat if inidicated. - Plan: Urine Culture, POCT urinalysis dip (device)  Anxiety and depression - referral to [facility] - Plan: Ambulatory referral to [facility] 10/09/2021, 10:06 AM

## 2021-10-10 ENCOUNTER — Other Ambulatory Visit: Payer: Self-pay

## 2021-10-10 ENCOUNTER — Other Ambulatory Visit (HOSPITAL_COMMUNITY): Payer: Self-pay

## 2021-10-10 LAB — CERVICOVAGINAL ANCILLARY ONLY
Bacterial Vaginitis (gardnerella): POSITIVE — AB
Candida Glabrata: NEGATIVE
Candida Vaginitis: NEGATIVE
Chlamydia: NEGATIVE
Comment: NEGATIVE
Comment: NEGATIVE
Comment: NEGATIVE
Comment: NEGATIVE
Comment: NEGATIVE
Comment: NORMAL
Neisseria Gonorrhea: NEGATIVE
Trichomonas: NEGATIVE

## 2021-10-10 LAB — URINE CULTURE

## 2021-10-16 ENCOUNTER — Other Ambulatory Visit (HOSPITAL_COMMUNITY): Payer: Self-pay

## 2021-10-16 ENCOUNTER — Other Ambulatory Visit (HOSPITAL_COMMUNITY): Payer: Self-pay | Admitting: Emergency Medicine

## 2021-10-17 ENCOUNTER — Other Ambulatory Visit (HOSPITAL_COMMUNITY): Payer: Self-pay

## 2021-10-21 ENCOUNTER — Other Ambulatory Visit: Payer: Self-pay

## 2021-10-21 ENCOUNTER — Ambulatory Visit
Admission: EM | Admit: 2021-10-21 | Discharge: 2021-10-21 | Disposition: A | Discharge status: Home / Self Care | Payer: Self-pay | Attending: Internal Medicine | Admitting: Internal Medicine

## 2021-10-21 ENCOUNTER — Encounter: Payer: Self-pay | Admitting: Emergency Medicine

## 2021-10-21 DIAGNOSIS — J069 Acute upper respiratory infection, unspecified: Secondary | ICD-10-CM

## 2021-10-21 DIAGNOSIS — R051 Acute cough: Secondary | ICD-10-CM

## 2021-10-21 MED ORDER — BENZONATATE 100 MG PO CAPS
100.0000 mg | ORAL_CAPSULE | Freq: Three times a day (TID) | ORAL | 0 refills | Status: DC | PRN
  Filled 2021-10-21: qty 21, 7d supply, fill #0

## 2021-10-21 MED ORDER — DEXAMETHASONE SODIUM PHOSPHATE 10 MG/ML IJ SOLN
10.0000 mg | Freq: Once | INTRAMUSCULAR | Status: AC
  Administered 2021-10-21: 10 mg via INTRAMUSCULAR

## 2021-10-21 NOTE — ED Triage Notes (Signed)
Pt here for nasal congestion and HA x 1 week

## 2021-10-21 NOTE — Discharge Instructions (Signed)
You were given a steroid shot in urgent care today.  A cough medication has also been sent to help alleviate cough.  Please follow-up if symptoms persist or worsen.

## 2021-10-21 NOTE — ED Provider Notes (Signed)
[facility]    CSN: [id number] Arrival date & time: 10/21/21  8338      History   Chief Complaint Chief Complaint  Patient presents with   Nasal Congestion    HPI Sherri [name] is a 35 y.o. female.   Patient presents with nasal congestion and headache that has been present for about a week.  Patient reports that headache is generalized.  Denies dizziness or blurred vision.  She also reports some mild nonproductive cough.  Reports that she really just does not feel well and has a lot of fatigue as well.  Denies any known sick contacts or fevers.  Patient does have a history of recurrent sinus infections but states that this does not feel the same.  States she does typically get headaches with her sinus infections though so she is not sure.  Patient has taken Tylenol and ibuprofen with minimal improvement.  She reports that she has not taken any over-the-counter cold and flu medications given that they "do not work".  She has appointment with ENT specialty given history of recurrent sinus infections in about a month.  Denies chest pain, shortness of breath, some nausea, vomiting, diarrhea, abdominal pain.     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    Urinary tract infection    Uterine fibroid     Patient Active Problem List   Diagnosis Date Noted   Pre-diabetes 07/23/2021   Anxiety and depression 06/24/2021   Fibromyalgia 06/24/2021   Prediabetes 07/05/2020   Gastroesophageal reflux disease 04/26/2020   Perianal rash 03/26/2020   Chronic nausea 03/26/2020   Chronic constipation 03/09/2019   Pelvic pain in female 03/09/2019   Hypertension    Genital herpes    Uterine fibroid 12/25/2013   Obesity 12/25/2013   Bacterial vaginitis 04/27/2012    Past Surgical History:   Procedure Laterality Date   CESAREAN SECTION N/A 06/05/2014   Procedure: CESAREAN SECTION;  Surgeon: Truett Mainland, DO;  Location: [facility];  Service: Obstetrics;  Laterality: N/A;   WISDOM TOOTH EXTRACTION      OB History     Gravida  1   Para  1   Term      Preterm  1   AB      Living  2      SAB      IAB      Ectopic      Multiple  1   Live Births  2            Home Medications    Prior to Admission medications   Medication Sig Start Date End Date Taking? Authorizing Provider  benzonatate (TESSALON) 100 MG capsule Take 1 capsule (100 mg total) by mouth every 8 (eight) hours as needed for cough. 10/21/21  Yes , Michele Rockers, FNP  amitriptyline (ELAVIL) 25 MG tablet Take 2 tablets (50 mg total) by mouth at bedtime. 06/24/21 09/22/21  Mayers, Cari S, PA-C  azelastine (ASTELIN) 0.1 % nasal spray Place 2 sprays into both nostrils 2 (two) times daily. 02/10/20   Tasia Catchings, Amy V, PA-C  cetirizine (ZYRTEC ALLERGY) 10 MG tablet Take 1 tablet (10 mg total) by mouth daily. 12/31/20   Camillia Herter, NP  Cholecalciferol (VITAMIN D3) 125 MCG (5000  UT) CAPS Take 1 capsule by mouth daily.    [provider]  clindamycin (CLEOCIN T) 1 % lotion Apply to cleansed face daily in the morning. 06/30/21     clindamycin (CLEOCIN) 2 % vaginal cream Place 1 Applicatorful vaginally at bedtime. 10/09/21   Donnamae Jude, MD  cyclobenzaprine (FLEXERIL) 5 MG tablet Take 1 tablet (5 mg total) by mouth at bedtime as needed for muscle spasms. 04/08/21   Camillia Herter, NP  famotidine (PEPCID) 20 MG tablet Take 1 tablet (20 mg total) by mouth 2 (two) times daily. 09/27/21   Carlisle Cater, PA-C  Ferrous Sulfate (IRON) 28 MG TABS Take 1 tablet by mouth daily.    [provider]  fluticasone (FLONASE) 50 MCG/ACT nasal spray Place 2 sprays into both nostrils daily. 11/26/20   Camillia Herter, NP  ibuprofen (ADVIL) 600 MG tablet Take 1 tablet (600 mg total) by mouth every 8 (eight) hours as  needed. 12/31/20   Camillia Herter, NP  naproxen (NAPROSYN) 500 MG tablet Take 1 tablet (500 mg total) by mouth 2 (two) times daily. 09/18/92   Prince Rome, PA-C  ondansetron (ZOFRAN-ODT) 4 MG disintegrating tablet Dissolve 1 tablet (4 mg total) by mouth every 8 (eight) hours as needed for nausea or vomiting. 09/27/21   Carlisle Cater, PA-C  pantoprazole (PROTONIX) 40 MG tablet TAKE 1 TABLET BY MOUTH DAILY. 08/06/21 08/06/22  Zehr, Janett Billow D, PA-C  polyethylene glycol powder (GLYCOLAX/MIRALAX) 17 GM/SCOOP powder Take 17 g by mouth daily as needed for mild constipation. 09/27/21   Carlisle Cater, PA-C  Prucalopride Succinate (MOTEGRITY) 2 MG TABS Take 1 tablet (2 mg total) by mouth daily. 09/18/21   Willia Craze, NP  sucralfate (CARAFATE) 1 g tablet Take 1 tablet (1 g total) by mouth 4 (four) times daily -  with meals and at bedtime. 09/27/21   Carlisle Cater, PA-C  tretinoin (RETIN-A) 0.025 % cream Apply pea size amount to cleansed face every other day at bedtime. Increase to every night as tolerated. 06/30/21     triamcinolone cream (KENALOG) 0.1 % Apply to affected areas twice daily for 3 days on and 3 days off cycle or as needed. 06/30/21       Family History Family History  Problem Relation Age of Onset   Hypertension Mother    Hypertension Father    Breast cancer Maternal Grandmother        26   Colon cancer Neg Hx    Colon polyps Neg Hx    Kidney disease Neg Hx    Diabetes Neg Hx    Esophageal cancer Neg Hx    Gallbladder disease Neg Hx    Heart disease Neg Hx    Asthma Neg Hx    Stroke Neg Hx    Stomach cancer Neg Hx     Social History Social History   Tobacco Use   Smoking status: Never   Smokeless tobacco: Never  Vaping Use   Vaping Use: Never used  Substance Use Topics   Alcohol use: No   Drug use: No     Allergies   Macrobid [nitrofurantoin], Augmentin [amoxicillin-pot clavulanate], and Metformin and related   Review of Systems Review of Systems Per  HPI  Physical Exam Triage Vital Signs ED Triage Vitals [10/21/21 0832]  Enc Vitals Group     BP 130/82     Pulse Rate 83     Resp 18     Temp 98.4 F (36.9  C)     Temp Source Oral     SpO2 98 %     Weight      Height      Head Circumference      Peak Flow      Pain Score 5     Pain Loc      Pain Edu?      Excl. in [place]?    No data found.  Updated Vital Signs BP 130/82 (BP Location: Left Arm)   Pulse 83   Temp 98.4 F (36.9 C) (Oral)   Resp 18   LMP 09/29/2021 (Exact Date)   SpO2 98%   Visual Acuity Right Eye Distance:   Left Eye Distance:   Bilateral Distance:    Right Eye Near:   Left Eye Near:    Bilateral Near:     Physical Exam Constitutional:      General: She is not in acute distress.    Appearance: Normal appearance. She is not toxic-appearing or diaphoretic.  HENT:     Head: Normocephalic and atraumatic.     Right Ear: Tympanic membrane and ear canal normal.     Left Ear: Tympanic membrane and ear canal normal.     Nose: Congestion present.     Mouth/Throat:     Mouth: Mucous membranes are moist.     Pharynx: No posterior oropharyngeal erythema.  Eyes:     Extraocular Movements: Extraocular movements intact.     Conjunctiva/sclera: Conjunctivae normal.     Pupils: Pupils are equal, round, and reactive to light.  Cardiovascular:     Rate and Rhythm: Normal rate and regular rhythm.     Pulses: Normal pulses.     Heart sounds: Normal heart sounds.  Pulmonary:     Effort: Pulmonary effort is normal. No respiratory distress.     Breath sounds: Normal breath sounds. No stridor. No wheezing, rhonchi or rales.  Abdominal:     General: Abdomen is flat. Bowel sounds are normal.     Palpations: Abdomen is soft.  Musculoskeletal:        General: Normal range of motion.     Cervical back: Normal range of motion.  Skin:    General: Skin is warm and dry.  Neurological:     General: No focal deficit present.     Mental Status: She is alert and oriented  to person, place, and time. Mental status is at baseline.     Cranial Nerves: Cranial nerves 2-12 are intact.     Sensory: Sensation is intact.     Motor: Motor function is intact.     Coordination: Coordination is intact.     Gait: Gait is intact.  Psychiatric:        Mood and Affect: Mood normal.        Behavior: Behavior normal.      UC Treatments / Results  Labs (all labs ordered are listed, but only abnormal results are displayed) Labs Reviewed - No data to display  EKG   Radiology No results found.  Procedures Procedures (including critical care time)  Medications Ordered in UC Medications  dexamethasone (DECADRON) injection 10 mg (10 mg Intramuscular Given 10/21/21 0845)    Initial Impression / Assessment and Plan / UC Course  I have reviewed the triage vital signs and the nursing notes.  Pertinent labs & imaging results that were available during my care of the patient were reviewed by me and considered in my medical decision making (see  chart for details).     Differential diagnoses include viral upper respiratory infection versus recurrent sinus infection.  Discussed with patient antibiotic treatment given history of recurrent sinus infections and headache associated with nasal congestion.  Patient declined antibiotics.  Risks associated with not doing antibiotics were discussed with patient.  Patient voiced understanding.  Patient is requesting steroid injection for symptoms as she has taken this before, has tolerated well, and has seen much improvement with this.  Will treat with IM Decadron here in urgent care.  Suggested COVID testing as well but patient declined.  Will send benzonatate for cough.  Discussed supportive care and symptom management with patient.  Advised patient to follow-up if symptoms persist or worsen.  Do not think imaging of the head is necessary given headache as I do think headache is related to upper respiratory infection and sinus pressure.   Neuro exam is also normal.  Patient given strict ER precautions.  Patient verbalized understanding and was agreeable with plan. Final Clinical Impressions(s) / UC Diagnoses   Final diagnoses:  Acute upper respiratory infection  Acute cough     Discharge Instructions      You were given a steroid shot in urgent care today.  A cough medication has also been sent to help alleviate cough.  Please follow-up if symptoms persist or worsen.    ED Prescriptions     Medication Sig Dispense Auth. Provider   benzonatate (TESSALON) 100 MG capsule Take 1 capsule (100 mg total) by mouth every 8 (eight) hours as needed for cough. 21 capsule [place], Michele Rockers, [place]      PDMP not reviewed this encounter.   Teodora Medici,  10/21/21 [phone]

## 2021-10-23 ENCOUNTER — Other Ambulatory Visit: Payer: Self-pay

## 2021-10-24 ENCOUNTER — Other Ambulatory Visit (HOSPITAL_COMMUNITY): Payer: Self-pay

## 2021-10-24 ENCOUNTER — Other Ambulatory Visit: Payer: Self-pay

## 2021-10-24 ENCOUNTER — Other Ambulatory Visit (HOSPITAL_COMMUNITY): Payer: Self-pay | Admitting: Emergency Medicine

## 2021-10-24 ENCOUNTER — Other Ambulatory Visit: Payer: Self-pay | Admitting: Family

## 2021-10-24 NOTE — Telephone Encounter (Signed)
Requested medication (s) are due for refill today: no  Requested medication (s) are on the active medication list: yes  Last refill:  09/27/21 #10 0 refills  Future visit scheduled: yes in 1 motn  Notes to clinic:  not delegated per protocol. Last ordered by Carlisle Cater, PA. Do you want  to refill RX?     Requested Prescriptions  Pending Prescriptions Disp Refills   ondansetron (ZOFRAN-ODT) 4 MG disintegrating tablet 10 tablet 0    Sig: Dissolve 1 tablet (4 mg total) by mouth every 8 (eight) hours as needed for nausea or vomiting.     Not Delegated - Gastroenterology: Antiemetics - ondansetron Failed - 10/24/2021  4:21 PM      Failed - This refill cannot be delegated      Passed - AST in normal range and within 360 days    AST  Date Value Ref Range Status  09/27/2021 16 15 - 41 U/L Final         Passed - ALT in normal range and within 360 days    ALT  Date Value Ref Range Status  09/27/2021 15 0 - 44 U/L Final         Passed - Valid encounter within last 6 months    Recent Outpatient Visits           1 month ago Prediabetes   [facility], Amy J, NP   4 months ago Prediabetes   Primary Care at [facility], Cari S, PA-C   6 months ago Acute bilateral low back pain, unspecified whether sciatica present   Primary Care at [facility], Amy J, NP   6 months ago Acute bilateral low back pain, unspecified whether sciatica present   Primary Care at [facility], Amy J, NP   8 months ago Bacterial vaginitis   Primary Care at [facility], Flonnie Hailstone, NP       Future Appointments             In 1 month Camillia Herter, NP Primary Care at [facility]

## 2021-10-24 NOTE — Telephone Encounter (Signed)
Medication Refill - Medication: ondansetron (ZOFRAN-ODT) 4 MG disintegrating tablet  Has the patient contacted their pharmacy? Yes.    Preferred Pharmacy (with phone number or street name):  [place] [facility] Phone:  [phone]  Fax:  [phone]     Has the patient been seen for an appointment in the last year OR does the patient have an upcoming appointment? Yes.    The patient is completely out and needs as soon as possible. Please assist patient further.

## 2021-10-30 ENCOUNTER — Ambulatory Visit: Payer: Self-pay | Admitting: Allergy & Immunology

## 2021-11-03 ENCOUNTER — Other Ambulatory Visit (HOSPITAL_COMMUNITY): Payer: Self-pay

## 2021-11-03 ENCOUNTER — Other Ambulatory Visit (HOSPITAL_COMMUNITY): Payer: Self-pay | Admitting: Emergency Medicine

## 2021-11-05 ENCOUNTER — Encounter: Payer: Self-pay | Admitting: Gastroenterology

## 2021-11-07 MED ORDER — ONDANSETRON 4 MG PO TBDP
4.0000 mg | ORAL_TABLET | Freq: Three times a day (TID) | ORAL | 0 refills | Status: DC | PRN
  Filled 2021-11-07: qty 10, 10d supply, fill #0

## 2021-11-08 ENCOUNTER — Other Ambulatory Visit (HOSPITAL_COMMUNITY): Payer: Self-pay

## 2021-11-15 ENCOUNTER — Ambulatory Visit (HOSPITAL_COMMUNITY)
Admission: EM | Admit: 2021-11-15 | Discharge: 2021-11-15 | Disposition: A | Discharge status: Home / Self Care | Payer: Self-pay | Attending: Internal Medicine | Admitting: Internal Medicine

## 2021-11-15 ENCOUNTER — Other Ambulatory Visit: Payer: Self-pay

## 2021-11-15 DIAGNOSIS — N898 Other specified noninflammatory disorders of vagina: Secondary | ICD-10-CM | POA: Insufficient documentation

## 2021-11-15 DIAGNOSIS — Z20822 Contact with and (suspected) exposure to covid-19: Secondary | ICD-10-CM | POA: Insufficient documentation

## 2021-11-15 DIAGNOSIS — R300 Dysuria: Secondary | ICD-10-CM | POA: Insufficient documentation

## 2021-11-15 DIAGNOSIS — R519 Headache, unspecified: Secondary | ICD-10-CM | POA: Insufficient documentation

## 2021-11-15 DIAGNOSIS — R350 Frequency of micturition: Secondary | ICD-10-CM | POA: Insufficient documentation

## 2021-11-15 LAB — POCT URINALYSIS DIPSTICK, ED / UC
Bilirubin Urine: NEGATIVE
Glucose, UA: NEGATIVE mg/dL
Ketones, ur: NEGATIVE mg/dL
Leukocytes,Ua: NEGATIVE
Nitrite: NEGATIVE
Protein, ur: NEGATIVE mg/dL
Specific Gravity, Urine: 1.02 (ref 1.005–1.030)
Urobilinogen, UA: 0.2 mg/dL (ref 0.0–1.0)
pH: 6 (ref 5.0–8.0)

## 2021-11-15 LAB — POC URINE PREG, ED: Preg Test, Ur: NEGATIVE

## 2021-11-15 MED ORDER — METOCLOPRAMIDE HCL 5 MG/ML IJ SOLN
5.0000 mg | Freq: Once | INTRAMUSCULAR | Status: AC
Start: 2021-11-15 — End: 2021-11-15
  Administered 2021-11-15: 5 mg via INTRAMUSCULAR

## 2021-11-15 MED ORDER — FLUCONAZOLE 150 MG PO TABS: 150.0000 mg | ORAL_TABLET | ORAL | 0 refills | Status: DC

## 2021-11-15 MED ORDER — KETOROLAC TROMETHAMINE 30 MG/ML IJ SOLN
INTRAMUSCULAR | Status: AC
  Filled 2021-11-15: qty 1

## 2021-11-15 MED ORDER — DEXAMETHASONE SODIUM PHOSPHATE 10 MG/ML IJ SOLN
INTRAMUSCULAR | Status: AC
  Filled 2021-11-15: qty 1

## 2021-11-15 MED ORDER — KETOROLAC TROMETHAMINE 30 MG/ML IJ SOLN
30.0000 mg | Freq: Once | INTRAMUSCULAR | Status: AC
Start: 2021-11-15 — End: 2021-11-15
  Administered 2021-11-15: 30 mg via INTRAMUSCULAR

## 2021-11-15 MED ORDER — METOCLOPRAMIDE HCL 5 MG/ML IJ SOLN
INTRAMUSCULAR | Status: AC
  Filled 2021-11-15: qty 2

## 2021-11-15 MED ORDER — DEXAMETHASONE SODIUM PHOSPHATE 10 MG/ML IJ SOLN
5.0000 mg | Freq: Once | INTRAMUSCULAR | Status: AC
  Administered 2021-11-15: 5 mg via INTRAMUSCULAR

## 2021-11-15 MED ORDER — DEXAMETHASONE SODIUM PHOSPHATE 10 MG/ML IJ SOLN: 10.0000 mg | Freq: Once | INTRAMUSCULAR | Status: DC

## 2021-11-15 NOTE — ED Triage Notes (Signed)
PT reports having HA for 2 weeks. Pt also has vaginal discharge and irritation .

## 2021-11-15 NOTE — ED Provider Notes (Signed)
[place]    CSN: [id number] Arrival date & time: 11/15/21  1114      History   Chief Complaint Chief Complaint  Patient presents with   Headache   Vaginitis    HPI Sherri [name] is a 36 y.o. female.   Patient presents urgent care for evaluation of frontal headache for the last 2 weeks that started upon waking approximately 2 weeks ago.  She has attempted use of Tylenol, ibuprofen, and Excedrin migraine headache pain medicine without relief of symptoms.  Headache is currently a 9 on a scale of 0-10 and described as a "pounding sensation".  Headache has been persistent over the last couple of weeks.  Patient has not had any Tylenol or ibuprofen since 7 days ago.  No triggering or relieving factors identified for patient's headache. No recent falls or head trauma. She has had the same headache symptoms in the past and has had to come to urgent care for medication in order for the headache to resolve.  Reports sensitivity to sunlight when outside but denies blurry vision, eye pain, or decreased visual acuity.  No dizziness, nausea, vomiting, URI symptoms, known exposure to sick contacts, fever/chills, abdominal pain, back pain, neck pain, and weakness.  At first she figured she might be a little bit dehydrated so she increase her water intake and attempt to relieve headache without success.  Also reporting "strong urine odor", dysuria, and urinary frequency for the last week.  Vaginal itching and thick white vaginal discharge also reported prior to onset of her menstrual cycle 2 days ago.  States she gets frequent urinary tract infections and vaginal yeast infections.  She denies diagnosis of diabetes although has had elevated hemoglobin A1c's in the past.  Admits to drinking lots of sodas and eating an increased amount of candy.  No recent new sexual partners.  She is in a monogamous relationship with 1 female partner and denies concern for STI.  Denies abdominal discomfort but  reports lower back discomfort for the last week.  No blood or mucus to the stools, diarrhea, or hematuria. She has not attempted use of OTC meds for urinary/vaginal symptoms prior to arrival at urgent care.   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    Urinary tract infection    Uterine fibroid     Patient Active Problem List   Diagnosis Date Noted   Pre-diabetes 07/23/2021   Anxiety and depression 06/24/2021   Fibromyalgia 06/24/2021   Prediabetes 07/05/2020   Gastroesophageal reflux disease 04/26/2020   Perianal rash 03/26/2020   Chronic nausea 03/26/2020   Chronic constipation 03/09/2019   Pelvic pain in female 03/09/2019   Hypertension    Genital herpes    Uterine fibroid 12/25/2013   Obesity 12/25/2013   Bacterial vaginitis 04/27/2012    Past Surgical History:  Procedure Laterality Date   CESAREAN SECTION N/A 06/05/2014   Procedure: CESAREAN SECTION;  Surgeon: Truett Mainland, DO;  Location: [facility];  Service: Obstetrics;  Laterality: N/A;   WISDOM TOOTH EXTRACTION      OB History     Gravida  1   Para  1   Term      Preterm  1   AB      Living  2      SAB      IAB      Ectopic      Multiple  1   Live Births  2            Home Medications    Prior to Admission medications   Medication Sig Start Date End Date Taking? Authorizing Provider  fluconazole (DIFLUCAN) 150 MG tablet Take 1 tablet (150 mg total) by mouth every 3 (three) days. 11/15/21  Yes Talbot Grumbling, FNP  amitriptyline (ELAVIL) 25 MG tablet Take 2 tablets (50 mg total) by mouth at bedtime. 06/24/21 09/22/21  Mayers, Cari S, PA-C  azelastine (ASTELIN) 0.1 % nasal spray Place 2 sprays into both nostrils 2 (two) times daily. 02/10/20   Tasia Catchings, Amy V, PA-C  benzonatate (TESSALON)  100 MG capsule Take 1 capsule (100 mg total) by mouth every 8 (eight) hours as needed for cough. 10/21/21   Teodora Medici, FNP  cetirizine (ZYRTEC ALLERGY) 10 MG tablet Take 1 tablet (10 mg total) by mouth daily. 12/31/20   Camillia Herter, NP  Cholecalciferol (VITAMIN D3) 125 MCG (5000 UT) CAPS Take 1 capsule by mouth daily.    [provider]  clindamycin (CLEOCIN T) 1 % lotion Apply to cleansed face daily in the morning. 06/30/21     clindamycin (CLEOCIN) 2 % vaginal cream Place 1 Applicatorful vaginally at bedtime. 10/09/21   Donnamae Jude, MD  cyclobenzaprine (FLEXERIL) 5 MG tablet Take 1 tablet (5 mg total) by mouth at bedtime as needed for muscle spasms. 04/08/21   Camillia Herter, NP  famotidine (PEPCID) 20 MG tablet Take 1 tablet (20 mg total) by mouth 2 (two) times daily. 09/27/21   Carlisle Cater, PA-C  Ferrous Sulfate (IRON) 28 MG TABS Take 1 tablet by mouth daily.    [provider]  fluticasone (FLONASE) 50 MCG/ACT nasal spray Place 2 sprays into both nostrils daily. 11/26/20   Camillia Herter, NP  ibuprofen (ADVIL) 600 MG tablet Take 1 tablet (600 mg total) by mouth every 8 (eight) hours as needed. 12/31/20   Camillia Herter, NP  naproxen (NAPROSYN) 500 MG tablet Take 1 tablet (500 mg total) by mouth 2 (two) times daily. 1/54/00   Prince Rome, PA-C  ondansetron (ZOFRAN-ODT) 4 MG disintegrating tablet Dissolve 1 tablet (4 mg total) by mouth every 8 (eight) hours as needed for nausea or vomiting. 11/07/21   Carlisle Cater, PA-C  pantoprazole (PROTONIX) 40 MG tablet TAKE 1 TABLET BY MOUTH DAILY. 08/06/21 08/06/22  Zehr, Janett Billow D, PA-C  polyethylene glycol powder (GLYCOLAX/MIRALAX) 17 GM/SCOOP powder Take 17 g by mouth daily as needed for mild constipation. 09/27/21   Carlisle Cater, PA-C  Prucalopride Succinate (MOTEGRITY) 2 MG TABS Take 1 tablet (2 mg total) by mouth daily. 09/18/21   Willia Craze, NP  sucralfate (CARAFATE) 1 g tablet Take 1 tablet (1 g total) by mouth 4  (four) times daily -  with meals and at bedtime. 09/27/21   Carlisle Cater, PA-C  tretinoin (RETIN-A) 0.025 % cream Apply pea size amount to cleansed face every other day at bedtime. Increase to every night as tolerated. 06/30/21     triamcinolone cream (KENALOG) 0.1 % Apply to affected areas twice daily for 3 days on and 3 days off cycle or as needed. 06/30/21       Family History Family History  Problem Relation Age of Onset   Hypertension Mother    Hypertension  Father    Breast cancer Maternal Grandmother        68   Colon cancer Neg Hx    Colon polyps Neg Hx    Kidney disease Neg Hx    Diabetes Neg Hx    Esophageal cancer Neg Hx    Gallbladder disease Neg Hx    Heart disease Neg Hx    Asthma Neg Hx    Stroke Neg Hx    Stomach cancer Neg Hx     Social History Social History   Tobacco Use   Smoking status: Never   Smokeless tobacco: Never  Vaping Use   Vaping Use: Never used  Substance Use Topics   Alcohol use: No   Drug use: No     Allergies   Macrobid [nitrofurantoin], Augmentin [amoxicillin-pot clavulanate], and Metformin and related   Review of Systems Review of Systems  Neurological:  Positive for headaches.  Per HPI   Physical Exam Triage Vital Signs ED Triage Vitals  Enc Vitals Group     BP 11/15/21 1212 (!) 144/95     Pulse Rate 11/15/21 1212 78     Resp 11/15/21 1212 16     Temp 11/15/21 1212 98.3 F (36.8 C)     Temp src --      SpO2 11/15/21 1212 98 %     Weight --      Height --      Head Circumference --      Peak Flow --      Pain Score 11/15/21 1208 9     Pain Loc --      Pain Edu? --      Excl. in [place]? --    No data found.  Updated Vital Signs BP (!) 144/95   Pulse 78   Temp 98.3 F (36.8 C)   Resp 16   LMP 11/15/2021 (Approximate)   SpO2 98%   Visual Acuity Right Eye Distance:   Left Eye Distance:   Bilateral Distance:    Right Eye Near:   Left Eye Near:    Bilateral Near:     Physical Exam Vitals and nursing note  reviewed.  Constitutional:      Appearance: Normal appearance. She is not ill-appearing or toxic-appearing.     Comments: Very pleasant patient sitting on exam in position of comfort table in no acute distress.   HENT:     Head: Normocephalic and atraumatic.     Right Ear: Hearing and external ear normal.     Left Ear: Hearing and external ear normal.     Nose: Nose normal.     Mouth/Throat:     Lips: Pink.     Mouth: Mucous membranes are dry.     Pharynx: Uvula midline. No posterior oropharyngeal erythema.  Eyes:     General: Lids are normal. Vision grossly intact. Gaze aligned appropriately. No visual field deficit.       Right eye: No discharge.        Left eye: No discharge.     Extraocular Movements: Extraocular movements intact.     Conjunctiva/sclera: Conjunctivae normal.     Pupils: Pupils are equal, round, and reactive to light.     Comments: EOMs normal without pain or dizziness elicited.  Cardiovascular:     Rate and Rhythm: Normal rate and regular rhythm.     Heart sounds: Normal heart sounds, S1 normal and S2 normal.  Pulmonary:     Effort: Pulmonary effort is normal.  No respiratory distress.     Breath sounds: Normal breath sounds and air entry.     Comments: Clear to auscultation bilaterally. Abdominal:     General: Abdomen is flat.     Palpations: Abdomen is soft.     Tenderness: There is no abdominal tenderness. There is right CVA tenderness and left CVA tenderness.  Musculoskeletal:     Cervical back: Neck supple.  Lymphadenopathy:     Cervical: No cervical adenopathy.  Skin:    General: Skin is warm and dry.     Capillary Refill: Capillary refill takes less than 2 seconds.     Findings: No rash.  Neurological:     General: No focal deficit present.     Mental Status: She is alert and oriented to person, place, and time. Mental status is at baseline.     Cranial Nerves: Cranial nerves 2-12 are intact. No dysarthria or facial asymmetry.     Sensory:  Sensation is intact.     Motor: Motor function is intact.     Coordination: Coordination is intact.     Gait: Gait is intact.     Comments: Nonfocal neuro exam.  5/5 power throughout.  Psychiatric:        Mood and Affect: Mood normal.        Speech: Speech normal.        Behavior: Behavior normal.        Thought Content: Thought content normal.        Judgment: Judgment normal.     UC Treatments / Results  Labs (all labs ordered are listed, but only abnormal results are displayed) Labs Reviewed  POCT URINALYSIS DIPSTICK, ED / UC - Abnormal; Notable for the following components:      Result Value   Hgb urine dipstick LARGE (*)    All other components within normal limits  SARS CORONAVIRUS 2 (TAT 6-24 HRS)  POC URINE PREG, ED  CERVICOVAGINAL ANCILLARY ONLY    EKG   Radiology No results found.  Procedures Procedures (including critical care time)  Medications Ordered in UC Medications  ketorolac (TORADOL) 30 MG/ML injection 30 mg (30 mg Intramuscular Given 11/15/21 1313)  metoCLOPramide (REGLAN) injection 5 mg (5 mg Intramuscular Given 11/15/21 1313)  dexamethasone (DECADRON) injection 5 mg (5 mg Intramuscular Given 11/15/21 1313)    Initial Impression / Assessment and Plan / UC Course  I have reviewed the triage vital signs and the nursing notes.  Pertinent labs & imaging results that were available during my care of the patient were reviewed by me and considered in my medical decision making (see chart for details).   1.  Bad headache Unknown etiology of headache. Urinalysis shows normal specific gravity. Neurologic exam is normal and without focal deficit. Headache cocktail given in clinic IM.  Reduced dose of Decadron given due to prediabetes. Headache cocktail helped slightly upon reassessment and brought headache from a 9 to a 7 on a scale of 0-10 after 20 minutes. Medications will continue working in patient's body to further reduce headache. She may start taking  ibuprofen/tylenol every 6 hours as needed for head pain tonight at approximately 11pm since she was given the steroid/ketorolac in the clinic today. Advised to take these medicines with food to avoid stomach upset. Strict ED return precautions given. COVID-19 testing is pending as headache to rule out viral cause of headache. Patient agreeable with plan.  2. Urinary frequency Urinalysis unremarkable for signs of urinary tract infection. Blood in urine  related to menstrual cycle. Urine pregnancy is negative. Urinary symptoms likely related to frequent intake of urinary irritants/sugary beverages. Advised patient to cut back on sugary beverages/urinary irritants and replace this with more water to stay well hydrated.   3. Vaginal itching Symptoms are consistent with vaginal yeast infection. We will go ahead and treat with diflucan once today, then again in 3 days. STI swab pending. Declines HIV/RPR testing. Will treat based on labs for all other positive STIs.   Discussed physical exam and available lab work findings in clinic with patient.  Counseled patient regarding appropriate use of medications and potential side effects for all medications recommended or prescribed today. Discussed red flag signs and symptoms of worsening condition,when to call the PCP office, return to urgent care, and when to seek higher level of care in the emergency department. Patient verbalizes understanding and agreement with plan. All questions answered. Patient discharged in stable condition.   Final Clinical Impressions(s) / UC Diagnoses   Final diagnoses:  Bad headache  Vaginal itching  Urinary frequency     Discharge Instructions      We gave you medications to treat your headache in the clinic.  You may start taking ibuprofen 600 mg and Tylenol 1000 mg every 6 hours over-the-counter as needed at home for headache starting tonight at approximately 11 PM since you were given medications in clinic.  Take these  meds with food to avoid stomach upset.  If your headache returns with worsening severity or blurry vision, nausea, vomiting, or any new or worsening symptoms, please return to urgent care or go to the nearest emergency department for severe symptoms.   Your urinary symptoms are likely related to increased intake of sugary beverages which are considered urinary irritants.  Your urinalysis does not show urinary tract infection today.  Continue to increase your water intake to flush out your kidneys and decrease the amount of sugary beverages/caffeinated beverages you drink.   We have sent off your vaginal swab for testing which will come back in the next 2 to 3 days.  I would like to go ahead and treat you for vaginal yeast infection today with Diflucan 1 dose today then another dose in 3 days.   If you develop any new or worsening symptoms or do not improve in the next 2 to 3 days, please return.  If your symptoms are severe, please go to the emergency room.  Follow-up with your primary care provider for further evaluation and management of your symptoms as well as ongoing wellness visits.  I hope you feel better!     ED Prescriptions     Medication Sig Dispense Auth. Provider   fluconazole (DIFLUCAN) 150 MG tablet Take 1 tablet (150 mg total) by mouth every 3 (three) days. 2 tablet Talbot Grumbling, FNP      PDMP not reviewed this encounter.   Talbot Grumbling, [place] 11/15/21 1951

## 2021-11-15 NOTE — Discharge Instructions (Addendum)
We gave you medications to treat your headache in the clinic.  You may start taking ibuprofen 600 mg and Tylenol 1000 mg every 6 hours over-the-counter as needed at home for headache starting tonight at approximately 11 PM since you were given medications in clinic.  Take these meds with food to avoid stomach upset.  If your headache returns with worsening severity or blurry vision, nausea, vomiting, or any new or worsening symptoms, please return to urgent care or go to the nearest emergency department for severe symptoms.   Your urinary symptoms are likely related to increased intake of sugary beverages which are considered urinary irritants.  Your urinalysis does not show urinary tract infection today.  Continue to increase your water intake to flush out your kidneys and decrease the amount of sugary beverages/caffeinated beverages you drink.   We have sent off your vaginal swab for testing which will come back in the next 2 to 3 days.  I would like to go ahead and treat you for vaginal yeast infection today with Diflucan 1 dose today then another dose in 3 days.   If you develop any new or worsening symptoms or do not improve in the next 2 to 3 days, please return.  If your symptoms are severe, please go to the emergency room.  Follow-up with your primary care provider for further evaluation and management of your symptoms as well as ongoing wellness visits.  I hope you feel better!

## 2021-11-16 LAB — SARS CORONAVIRUS 2 (TAT 6-24 HRS): SARS Coronavirus 2: NEGATIVE

## 2021-11-17 ENCOUNTER — Other Ambulatory Visit (HOSPITAL_COMMUNITY): Payer: Self-pay | Admitting: Emergency Medicine

## 2021-11-17 ENCOUNTER — Other Ambulatory Visit: Payer: Self-pay | Admitting: Gastroenterology

## 2021-11-17 ENCOUNTER — Other Ambulatory Visit (HOSPITAL_COMMUNITY): Payer: Self-pay

## 2021-11-17 LAB — CERVICOVAGINAL ANCILLARY ONLY
Bacterial Vaginitis (gardnerella): NEGATIVE
Candida Glabrata: NEGATIVE
Candida Vaginitis: POSITIVE — AB
Chlamydia: NEGATIVE
Comment: NEGATIVE
Comment: NEGATIVE
Comment: NEGATIVE
Comment: NEGATIVE
Comment: NEGATIVE
Comment: NORMAL
Neisseria Gonorrhea: NEGATIVE
Trichomonas: NEGATIVE

## 2021-11-17 MED ORDER — PANTOPRAZOLE SODIUM 40 MG PO TBEC
40.0000 mg | DELAYED_RELEASE_TABLET | Freq: Every day | ORAL | 6 refills | Status: DC
  Filled 2021-11-17: qty 30, 30d supply, fill #0
  Filled 2021-12-22: qty 30, 30d supply, fill #1
  Filled 2022-01-07 – 2022-01-14 (×3): qty 30, 30d supply, fill #2
  Filled 2022-02-16 (×2): qty 30, 30d supply, fill #3
  Filled 2022-02-19 – 2022-02-20 (×3): qty 30, 30d supply, fill #4
  Filled 2022-05-19 – 2022-05-22 (×2): qty 30, 30d supply, fill #5
  Filled 2022-07-02: qty 30, 30d supply, fill #6

## 2021-11-18 ENCOUNTER — Other Ambulatory Visit (HOSPITAL_COMMUNITY): Payer: Self-pay | Admitting: Emergency Medicine

## 2021-11-19 ENCOUNTER — Other Ambulatory Visit (HOSPITAL_COMMUNITY): Payer: Self-pay

## 2021-11-19 ENCOUNTER — Ambulatory Visit: Payer: Self-pay

## 2021-11-20 ENCOUNTER — Other Ambulatory Visit (HOSPITAL_COMMUNITY): Payer: Self-pay | Admitting: Emergency Medicine

## 2021-11-20 ENCOUNTER — Other Ambulatory Visit (HOSPITAL_COMMUNITY): Payer: Self-pay

## 2021-11-20 ENCOUNTER — Other Ambulatory Visit: Payer: Self-pay | Admitting: Family

## 2021-11-20 DIAGNOSIS — M545 Low back pain, unspecified: Secondary | ICD-10-CM

## 2021-11-20 NOTE — Telephone Encounter (Signed)
Requested medication (s) are due for refill today: yes  Requested medication (s) are on the active medication list: yes    Last refill: 04/08/21  #30  0 refills  Future visit scheduled yes 12/01/21  Notes to clinic:Not delegated, please review. Thank you.  Requested Prescriptions  Pending Prescriptions Disp Refills   cyclobenzaprine (FLEXERIL) 5 MG tablet 30 tablet 0    Sig: Take 1 tablet (5 mg total) by mouth at bedtime as needed for muscle spasms.     Not Delegated - Analgesics:  Muscle Relaxants Failed - 11/20/2021  1:08 PM      Failed - This refill cannot be delegated      Passed - Valid encounter within last 6 months    Recent Outpatient Visits           2 months ago Prediabetes   [facility] at [facility], Amy J, NP   4 months ago Prediabetes   Primary Care at [facility], Cari S, PA-C   7 months ago Acute bilateral low back pain, unspecified whether sciatica present   Primary Care at [facility], Amy J, NP   7 months ago Acute bilateral low back pain, unspecified whether sciatica present   Primary Care at [facility], Amy J, NP   9 months ago Bacterial vaginitis   [facility] Care at [facility], Flonnie Hailstone, NP       Future Appointments             In 1 week Camillia Herter, NP Primary Care at [facility]

## 2021-11-21 ENCOUNTER — Ambulatory Visit
Admission: EM | Admit: 2021-11-21 | Discharge: 2021-11-21 | Disposition: A | Discharge status: Home / Self Care | Payer: Self-pay | Attending: Internal Medicine | Admitting: Internal Medicine

## 2021-11-21 ENCOUNTER — Other Ambulatory Visit (HOSPITAL_COMMUNITY): Payer: Self-pay

## 2021-11-21 ENCOUNTER — Other Ambulatory Visit (HOSPITAL_COMMUNITY): Payer: Self-pay | Admitting: Emergency Medicine

## 2021-11-21 DIAGNOSIS — J069 Acute upper respiratory infection, unspecified: Secondary | ICD-10-CM

## 2021-11-21 DIAGNOSIS — R030 Elevated blood-pressure reading, without diagnosis of hypertension: Secondary | ICD-10-CM

## 2021-11-21 MED ORDER — DEXAMETHASONE SODIUM PHOSPHATE 10 MG/ML IJ SOLN
10.0000 mg | Freq: Once | INTRAMUSCULAR | Status: AC
  Administered 2021-11-21: 10 mg via INTRAMUSCULAR

## 2021-11-21 MED ORDER — CYCLOBENZAPRINE HCL 5 MG PO TABS
5.0000 mg | ORAL_TABLET | Freq: Every evening | ORAL | 2 refills | Status: DC | PRN
  Filled 2021-11-21: qty 30, 30d supply, fill #0

## 2021-11-21 NOTE — Discharge Instructions (Signed)
You were given a steroid shot in urgent care today to help alleviate your symptoms.  Please follow-up if symptoms persist or worsen.  Please get a blood pressure cuff from [facility], [facility], [facility] in for blood pressure.  Follow-up with primary care doctor or urgent care if it remains elevated.

## 2021-11-21 NOTE — ED Triage Notes (Signed)
Pt presents to uc with co of sinus pressure and pain for 2 weeks, pt has attempted otc allergy and pain medication with minimal improvements.

## 2021-11-21 NOTE — Telephone Encounter (Signed)
Order complete. 

## 2021-11-21 NOTE — ED Provider Notes (Signed)
[facility]    CSN: [id number] Arrival date & time: 11/21/21  1613      History   Chief Complaint Chief Complaint  Patient presents with   Facial Pain    HPI Sherri [name] is a 36 y.o. female.   Patient presents with sinus pressure, nasal congestion, sinus pain that has been present for about 2 weeks.  Patient reports very minimal nonproductive cough.  Denies any associated fever or sick contacts.  Patient has taken Tylenol with minimal improvement in symptoms.  Denies chest pain, shortness of breath, sore throat, ear pain, nausea, vomiting, diarrhea, abdominal pain.     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    Urinary tract infection    Uterine fibroid     Patient Active Problem List   Diagnosis Date Noted   Pre-diabetes 07/23/2021   Anxiety and depression 06/24/2021   Fibromyalgia 06/24/2021   Prediabetes 07/05/2020   Gastroesophageal reflux disease 04/26/2020   Perianal rash 03/26/2020   Chronic nausea 03/26/2020   Chronic constipation 03/09/2019   Pelvic pain in female 03/09/2019   Hypertension    Genital herpes    Uterine fibroid 12/25/2013   Obesity 12/25/2013   Bacterial vaginitis 04/27/2012    Past Surgical History:  Procedure Laterality Date   CESAREAN SECTION N/A 06/05/2014   Procedure: CESAREAN SECTION;  Surgeon: Truett Mainland, DO;  Location: [facility];  Service: Obstetrics;  Laterality: N/A;   WISDOM TOOTH EXTRACTION      OB History     Gravida  1   Para  1   Term      Preterm  1   AB      Living  2      SAB      IAB      Ectopic      Multiple  1   Live Births  2            Home Medications    Prior to Admission medications   Medication Sig Start Date End Date Taking? Authorizing Provider  amitriptyline  (ELAVIL) 25 MG tablet Take 2 tablets (50 mg total) by mouth at bedtime. 06/24/21 09/22/21  Mayers, Cari S, PA-C  azelastine (ASTELIN) 0.1 % nasal spray Place 2 sprays into both nostrils 2 (two) times daily. 02/10/20   Tasia Catchings, Amy V, PA-C  benzonatate (TESSALON) 100 MG capsule Take 1 capsule (100 mg total) by mouth every 8 (eight) hours as needed for cough. 10/21/21   Teodora Medici, FNP  cetirizine (ZYRTEC ALLERGY) 10 MG tablet Take 1 tablet (10 mg total) by mouth daily. 12/31/20   Camillia Herter, NP  Cholecalciferol (VITAMIN D3) 125 MCG (5000 UT) CAPS Take 1 capsule by mouth daily.    [provider]  clindamycin (CLEOCIN T) 1 % lotion Apply to cleansed face daily in the morning. 06/30/21     clindamycin (CLEOCIN) 2 % vaginal cream Place 1 Applicatorful vaginally at bedtime. 10/09/21   Donnamae Jude, MD  cyclobenzaprine (FLEXERIL) 5 MG tablet Take 1 tablet (5 mg total) by mouth at bedtime as needed for muscle spasms. 11/21/21   Camillia Herter, NP  famotidine (PEPCID) 20 MG tablet Take 1 tablet (20 mg total) by mouth 2 (two) times daily. 09/27/21  Carlisle Cater, PA-C  Ferrous Sulfate (IRON) 28 MG TABS Take 1 tablet by mouth daily.    [provider]  fluconazole (DIFLUCAN) 150 MG tablet Take 1 tablet (150 mg total) by mouth every 3 (three) days. 11/15/21   Talbot Grumbling, FNP  fluticasone (FLONASE) 50 MCG/ACT nasal spray Place 2 sprays into both nostrils daily. 11/26/20   Camillia Herter, NP  ibuprofen (ADVIL) 600 MG tablet Take 1 tablet (600 mg total) by mouth every 8 (eight) hours as needed. 12/31/20   Camillia Herter, NP  naproxen (NAPROSYN) 500 MG tablet Take 1 tablet (500 mg total) by mouth 2 (two) times daily. 5/63/89   Prince Rome, PA-C  ondansetron (ZOFRAN-ODT) 4 MG disintegrating tablet Dissolve 1 tablet (4 mg total) by mouth every 8 (eight) hours as needed for nausea or vomiting. 11/07/21   Carlisle Cater, PA-C  pantoprazole (PROTONIX) 40 MG tablet Take 1 tablet (40 mg  total) by mouth daily. 11/17/21   Willia Craze, NP  polyethylene glycol powder (GLYCOLAX/MIRALAX) 17 GM/SCOOP powder Take 17 g by mouth daily as needed for mild constipation. 09/27/21   Carlisle Cater, PA-C  Prucalopride Succinate (MOTEGRITY) 2 MG TABS Take 1 tablet (2 mg total) by mouth daily. 09/18/21   Willia Craze, NP  sucralfate (CARAFATE) 1 g tablet Take 1 tablet (1 g total) by mouth 4 (four) times daily -  with meals and at bedtime. 09/27/21   Carlisle Cater, PA-C  tretinoin (RETIN-A) 0.025 % cream Apply pea size amount to cleansed face every other day at bedtime. Increase to every night as tolerated. 06/30/21     triamcinolone cream (KENALOG) 0.1 % Apply to affected areas twice daily for 3 days on and 3 days off cycle or as needed. 06/30/21       Family History Family History  Problem Relation Age of Onset   Hypertension Mother    Hypertension Father    Breast cancer Maternal Grandmother        3   Colon cancer Neg Hx    Colon polyps Neg Hx    Kidney disease Neg Hx    Diabetes Neg Hx    Esophageal cancer Neg Hx    Gallbladder disease Neg Hx    Heart disease Neg Hx    Asthma Neg Hx    Stroke Neg Hx    Stomach cancer Neg Hx     Social History Social History   Tobacco Use   Smoking status: Never   Smokeless tobacco: Never  Vaping Use   Vaping Use: Never used  Substance Use Topics   Alcohol use: No   Drug use: No     Allergies   Macrobid [nitrofurantoin], Augmentin [amoxicillin-pot clavulanate], and Metformin and related   Review of Systems Review of Systems Per HPI  Physical Exam Triage Vital Signs ED Triage Vitals  Enc Vitals Group     BP 11/21/21 1759 (!) 163/95     Pulse Rate 11/21/21 1759 72     Resp 11/21/21 1759 18     Temp 11/21/21 1759 (!) 97.4 F (36.3 C)     Temp src --      SpO2 11/21/21 1759 98 %     Weight --      Height --      Head Circumference --      Peak Flow --      Pain Score 11/21/21 1739 4     Pain Loc --  Pain Edu?  --      Excl. in [place]? --    No data found.  Updated Vital Signs BP (!) 168/108   Pulse 72   Temp (!) 97.4 F (36.3 C)   Resp 18   LMP 11/15/2021 (Approximate)   SpO2 98%   Visual Acuity Right Eye Distance:   Left Eye Distance:   Bilateral Distance:    Right Eye Near:   Left Eye Near:    Bilateral Near:     Physical Exam Constitutional:      General: She is not in acute distress.    Appearance: Normal appearance. She is not toxic-appearing or diaphoretic.  HENT:     Head: Normocephalic and atraumatic.     Right Ear: Tympanic membrane and ear canal normal.     Left Ear: Tympanic membrane and ear canal normal.     Nose: Congestion present.     Right Sinus: Maxillary sinus tenderness and frontal sinus tenderness present.     Left Sinus: Maxillary sinus tenderness and frontal sinus tenderness present.     Mouth/Throat:     Mouth: Mucous membranes are moist.     Pharynx: No posterior oropharyngeal erythema.  Eyes:     Extraocular Movements: Extraocular movements intact.     Conjunctiva/sclera: Conjunctivae normal.     Pupils: Pupils are equal, round, and reactive to light.  Cardiovascular:     Rate and Rhythm: Normal rate and regular rhythm.     Pulses: Normal pulses.     Heart sounds: Normal heart sounds.  Pulmonary:     Effort: Pulmonary effort is normal. No respiratory distress.     Breath sounds: Normal breath sounds. No stridor. No wheezing, rhonchi or rales.  Abdominal:     General: Abdomen is flat. Bowel sounds are normal.     Palpations: Abdomen is soft.  Musculoskeletal:        General: Normal range of motion.     Cervical back: Normal range of motion.  Skin:    General: Skin is warm and dry.  Neurological:     General: No focal deficit present.     Mental Status: She is alert and oriented to person, place, and time. Mental status is at baseline.  Psychiatric:        Mood and Affect: Mood normal.        Behavior: Behavior normal.      UC Treatments  / Results  Labs (all labs ordered are listed, but only abnormal results are displayed) Labs Reviewed - No data to display  EKG   Radiology No results found.  Procedures Procedures (including critical care time)  Medications Ordered in UC Medications  dexamethasone (DECADRON) injection 10 mg (10 mg Intramuscular Given 11/21/21 1810)    Initial Impression / Assessment and Plan / UC Course  I have reviewed the triage vital signs and the nursing notes.  Pertinent labs & imaging results that were available during my care of the patient were reviewed by me and considered in my medical decision making (see chart for details).    It appears the patient has a sinus infection.  Suggested antibiotic therapy given duration of symptoms but patient declined.  Risks associated with not doing antibiotics were discussed with patient.  Patient voiced understanding.  Patient is requesting steroid therapy as she typically takes this with resolution of symptoms.  Patient recently had steroid injection about a week or so prior, so I do think this would be reasonable and  safe.  Patient was advised to follow-up if symptoms persist or worsen.  Do not think viral testing is necessary given duration of symptoms as it would not change treatment.  Patient also has elevated blood pressure reading but no signs of hypertensive urgency or endorgan damage.  Neuro exam is also normal.  Patient's blood pressure is typically not this elevated so patient was advised to monitor blood pressure at home with home blood pressure cuff and to follow-up with PCP or urgent care if it remains elevated.  Patient verbalized understanding and was agreeable with plan. Final Clinical Impressions(s) / UC Diagnoses   Final diagnoses:  Acute upper respiratory infection  Elevated blood pressure reading     Discharge Instructions      You were given a steroid shot in urgent care today to help alleviate your symptoms.  Please follow-up if  symptoms persist or worsen.  Please get a blood pressure cuff from [facility], [facility], [facility] in for blood pressure.  Follow-up with primary care doctor or urgent care if it remains elevated.    ED Prescriptions   None    PDMP not reviewed this encounter.   Teodora Medici, [place] 11/21/21 [phone]

## 2021-11-21 NOTE — Telephone Encounter (Signed)
Order complete. Call patient with the following update.   Patient referred to Orthopedics for back pain management on 04/22/2021.   Subsequently patient canceled appointments with the same on 05/05/2021, 06/02/2021, 06/23/2021, and 07/31/2021.   Also, patient no showed on 08/12/2021.   Upon review Orthopedics has closed referral.   Would patient like me to reorder a new request?

## 2021-11-23 NOTE — Progress Notes (Signed)
Patient ID: Sherri [name], female    DOB: 1985-12-24  MRN: [id number]  CC: Follow-Up  Subjective: Sherri [name] is a 36 y.o. female who presents for follow-up.   Her concerns today include:  11/30/2021 [facility] Emergency Department per MD note: Medical Decision Making   36 year old female presents to the ED for evaluation of her headaches.  Please see HPI for further details.   On examination the patient is in no apparent distress.  The patient is afebrile and nontachycardic.  The patient lung sounds are clear bilaterally, she is not hypoxic.  The patient abdomen is soft and compressible.  The patient neurological examination shows no focal neurodeficits.   Initially the patient was treated with 15 mg of Toradol as well as 10 mg Decadron to prevent recurrence of headaches.  The patient was also given 4 mg of IV Zofran for her nausea.  Labs were collected to include CBC, BMP.  Patient labs are unremarkable.  After the patient was given Toradol, Decadron and she stated that her headache had decreased from 9 out of 10 to 6 out of 10 however she was still complaining of a "pressure".  Shared decision-making conversation was then had and advised the patient that I could further give her medication to try and treat her headache however it would cause her to become drowsy, the patient stated that she would like to explore this option.  The patient was given 5 mg Compazine, 12 and half milligrams of Benadryl.  After this was administered, the patient began complaining of restlessness most likely due to akathisia.  The patient was then given another 12-1/2 mg of Benadryl which she stated alleviated her restlessness.  The patient then had a head CT done that did not show any intracranial abnormality or pathology.   At this time, patient states that her nausea as well as headache have relieved.  The patient will be discharged home and advised to follow-up with her PCP for further management.   The patient was given return precautions and she voiced understanding.  The patient had all of her questions answered to her satisfaction.  The patient stable at this time for discharge home.   Today's visit 12/01/2021: - Headache improved some since emergency department discharge but still mild lingering. Reports usually takes over-the-counter Excedrin and doesn't help much. We discussed blood pressure likely elevated at emergency department secondary to headaches. Rechecked blood pressure today in office and normal. - She is established with Gynecology. Last appointment 10/09/2021. Today having burning with urination and would like to screen urine sample.  - Reports she never received call from Orthopedics for an appointment. Would like referral back.  - Has upcoming appointment 12/29/2021 with Psychiatry.  - Has upcoming appointment 12/04/2021 with Gastroenterology.  - Reports she has [employer] Medicaid only.    Patient Active Problem List   Diagnosis Date Noted   Pre-diabetes 07/23/2021   Anxiety and depression 06/24/2021   Fibromyalgia 06/24/2021   Prediabetes 07/05/2020   Gastroesophageal reflux disease 04/26/2020   Perianal rash 03/26/2020   Chronic nausea 03/26/2020   Chronic constipation 03/09/2019   Pelvic pain in female 03/09/2019   Hypertension    Genital herpes    Uterine fibroid 12/25/2013   Obesity 12/25/2013   Bacterial vaginitis 04/27/2012     Current Outpatient Medications on File Prior to Visit  Medication Sig Dispense Refill   amitriptyline (ELAVIL) 25 MG tablet Take 2 tablets (50 mg total) by mouth at bedtime.  60 tablet 2   azelastine (ASTELIN) 0.1 % nasal spray Place 2 sprays into both nostrils 2 (two) times daily. 30 mL 0   benzonatate (TESSALON) 100 MG capsule Take 1 capsule (100 mg total) by mouth every 8 (eight) hours as needed for cough. 21 capsule 0   cetirizine (ZYRTEC ALLERGY) 10 MG tablet Take 1 tablet (10 mg total) by mouth daily. 30 tablet 11    Cholecalciferol (VITAMIN D3) 125 MCG (5000 UT) CAPS Take 1 capsule by mouth daily.     clindamycin (CLEOCIN T) 1 % lotion Apply to cleansed face daily in the morning. 60 mL 1   clindamycin (CLEOCIN) 2 % vaginal cream Place 1 Applicatorful vaginally at bedtime. 40 g 0   cyclobenzaprine (FLEXERIL) 5 MG tablet Take 1 tablet (5 mg total) by mouth at bedtime as needed for muscle spasms. 30 tablet 2   famotidine (PEPCID) 20 MG tablet Take 1 tablet (20 mg total) by mouth 2 (two) times daily. 30 tablet 0   Ferrous Sulfate (IRON) 28 MG TABS Take 1 tablet by mouth daily.     fluconazole (DIFLUCAN) 150 MG tablet Take 1 tablet (150 mg total) by mouth every 3 (three) days. 2 tablet 0   fluticasone (FLONASE) 50 MCG/ACT nasal spray Place 2 sprays into both nostrils daily. 16 g 0   ibuprofen (ADVIL) 600 MG tablet Take 1 tablet (600 mg total) by mouth every 8 (eight) hours as needed. 30 tablet 0   naproxen (NAPROSYN) 500 MG tablet Take 1 tablet (500 mg total) by mouth 2 (two) times daily. 30 tablet 0   ondansetron (ZOFRAN-ODT) 4 MG disintegrating tablet Dissolve 1 tablet (4 mg total) by mouth every 8 (eight) hours as needed for nausea or vomiting. 10 tablet 0   pantoprazole (PROTONIX) 40 MG tablet Take 1 tablet (40 mg total) by mouth daily. 30 tablet 6   polyethylene glycol powder (GLYCOLAX/MIRALAX) 17 GM/SCOOP powder Take 17 g by mouth daily as needed for mild constipation. 238 g 0   Prucalopride Succinate (MOTEGRITY) 2 MG TABS Take 1 tablet (2 mg total) by mouth daily. 30 tablet 3   sucralfate (CARAFATE) 1 g tablet Take 1 tablet (1 g total) by mouth 4 (four) times daily -  with meals and at bedtime. 60 tablet 0   tretinoin (RETIN-A) 0.025 % cream Apply pea size amount to cleansed face every other day at bedtime. Increase to every night as tolerated. 20 g 0   triamcinolone cream (KENALOG) 0.1 % Apply to affected areas twice daily for 3 days on and 3 days off cycle or as needed. 80 g 0   No current  facility-administered medications on file prior to visit.    Allergies  Allergen Reactions   Macrobid [Nitrofurantoin] Nausea And Vomiting   Augmentin [Amoxicillin-Pot Clavulanate]     Pt states it makes her sick   Metformin And Related Nausea And Vomiting    Social History   Socioeconomic History   Marital status: Single    Spouse name: Not on file   Number of children: Not on file   Years of education: Not on file   Highest education level: Not on file  Occupational History   Occupation: [employer]  Tobacco Use   Smoking status: Never   Smokeless tobacco: Never  Vaping Use   Vaping Use: Never used  Substance and Sexual Activity   Alcohol use: No   Drug use: No   Sexual activity: Yes    Birth control/protection: None  Other Topics Concern   Not on file  Social History Narrative   Not on file   Social Determinants of Health   Financial Resource Strain: Not on file  Food Insecurity: Not on file  Transportation Needs: Not on file  Physical Activity: Not on file  Stress: Not on file  Social Connections: Not on file  Intimate Partner Violence: Not on file    Family History  Problem Relation Age of Onset   Hypertension Mother    Hypertension Father    Breast cancer Maternal Grandmother        64   Colon cancer Neg Hx    Colon polyps Neg Hx    Kidney disease Neg Hx    Diabetes Neg Hx    Esophageal cancer Neg Hx    Gallbladder disease Neg Hx    Heart disease Neg Hx    Asthma Neg Hx    Stroke Neg Hx    Stomach cancer Neg Hx     Past Surgical History:  Procedure Laterality Date   CESAREAN SECTION N/A 06/05/2014   Procedure: CESAREAN SECTION;  Surgeon: Truett Mainland, DO;  Location: [facility];  Service: Obstetrics;  Laterality: N/A;   WISDOM TOOTH EXTRACTION      ROS: Review of Systems Negative except as stated above  PHYSICAL EXAM: BP 126/78   Pulse 77   Temp 98.3 F (36.8 C)   Resp 16   Ht 4' 11.02" (1.499 m)   Wt 186 lb (84.4 kg)   LMP  11/15/2021 (Approximate)   SpO2 98%   BMI 37.55 kg/m   Physical Exam HENT:     Head: Normocephalic and atraumatic.  Eyes:     Extraocular Movements: Extraocular movements intact.     Conjunctiva/sclera: Conjunctivae normal.     Pupils: Pupils are equal, round, and reactive to light.  Cardiovascular:     Rate and Rhythm: Normal rate and regular rhythm.     Pulses: Normal pulses.     Heart sounds: Normal heart sounds.  Pulmonary:     Effort: Pulmonary effort is normal.     Breath sounds: Normal breath sounds.  Musculoskeletal:     Cervical back: Normal range of motion and neck supple.  Neurological:     General: No focal deficit present.     Mental Status: She is alert and oriented to person, place, and time.  Psychiatric:        Mood and Affect: Mood normal.        Behavior: Behavior normal.    Results for orders placed or performed in visit on 12/01/21  POCT URINALYSIS DIP (CLINITEK)  Result Value Ref Range   Color, UA yellow yellow   Clarity, UA clear clear   Glucose, UA negative negative mg/dL   Bilirubin, UA negative negative   Ketones, POC UA trace (5) (A) negative mg/dL   Spec Grav, UA 1.010 1.010 - 1.025   Blood, UA trace-intact (A) negative   pH, UA 6.0 5.0 - 8.0   POC PROTEIN,UA negative negative, trace   Urobilinogen, UA 0.2 0.2 or 1.0 E.U./dL   Nitrite, UA Negative Negative   Leukocytes, UA Negative Negative    ASSESSMENT AND PLAN: 1. Bad headache - Emergency department visit 11/30/2021 at [facility] Today patient reports feeling improved but still has mild lingering headache. No neurologic deficits present today.  - Sumatriptan as prescribed. Counseled on medication adherence and adverse effects.  - Continue over-the-counter analgesics as needed.  - Referral  to Neurology for further evaluation and management.  - Ambulatory referral to Neurology - SUMAtriptan (IMITREX) 25 MG tablet; Take 25 mg (1 tablet total) by mouth at the start of the  headache. May repeat in 2 hours x 1 if headache persists. Max of 2 tablets/24 hours.  Dispense: 30 tablet; Refill: 1  2. Prediabetes - Update screening. - Hemoglobin A1c  3. Chronic back pain, unspecified back location, unspecified back pain laterality - Continue Cyclobenzaprine as prescribed. No refills needed as of present.  - Referral to Orthopedic Surgery for further evaluation and management.  - Ambulatory referral to Orthopedic Surgery  4. Anxiety and depression - Keep appointment scheduled 12/29/2021 with Behavioral Health.  5. Burning with urination - No urinary tract infection. - POCT URINALYSIS DIP (CLINITEK)  6. Financial difficulties - Patient currently has [employer] only. Discussed with patient she may want to apply for Medicaid. Patient verbalized understanding. - Offered patient [place] financial discount/orange card. Counseled patient will need to mail in completed application for processing and schedule appointment with financial counselor. Patient agreeable.    Patient was given the opportunity to ask questions.  Patient verbalized understanding of the plan and was able to repeat key elements of the plan. Patient was given clear instructions to go to Emergency Department or return to medical center if symptoms don't improve, worsen, or new problems develop.The patient verbalized understanding.   Orders Placed This Encounter  Procedures   Hemoglobin A1c   Ambulatory referral to Neurology   Ambulatory referral to Orthopedic Surgery   POCT URINALYSIS DIP (CLINITEK)     Requested Prescriptions   Signed Prescriptions Disp Refills   SUMAtriptan (IMITREX) 25 MG tablet 30 tablet 1    Sig: Take 25 mg (1 tablet total) by mouth at the start of the headache. May repeat in 2 hours x 1 if headache persists. Max of 2 tablets/24 hours.    Return for Patient needs CAFA application.  Camillia Herter, NP

## 2021-11-24 ENCOUNTER — Other Ambulatory Visit: Payer: Self-pay | Admitting: Family

## 2021-11-24 ENCOUNTER — Other Ambulatory Visit (HOSPITAL_COMMUNITY): Payer: Self-pay | Admitting: Emergency Medicine

## 2021-11-24 NOTE — Telephone Encounter (Signed)
Medication Refill - Medication: ondansetron (ZOFRAN-ODT) 4 MG disintegrating tablet  Has the patient contacted their pharmacy? Yes.    (Agent: If yes, when and what did the pharmacy advise?) request sent to prescribing provider  Preferred Pharmacy (with phone number or street name):  [place] Phone:  [phone]  Fax:  [phone]     Has the patient been seen for an appointment in the last year OR does the patient have an upcoming appointment? Yes.    Agent: Please be advised that RX refills may take up to 3 business days. We ask that you follow-up with your pharmacy.

## 2021-11-25 NOTE — Telephone Encounter (Signed)
Requested medication (s) are due for refill today: yes  Requested medication (s) are on the active medication list: yes  Last refill:  11/07/21 #10 0 refills  Future visit scheduled: yes in 6 days  Notes to clinic:  not delegated per protocol. Last ordered by Carlisle Cater, PA. Do you want to refill Rx?     Requested Prescriptions  Pending Prescriptions Disp Refills   ondansetron (ZOFRAN-ODT) 4 MG disintegrating tablet 10 tablet 0    Sig: Dissolve 1 tablet (4 mg total) by mouth every 8 (eight) hours as needed for nausea or vomiting.     Not Delegated - Gastroenterology: Antiemetics - ondansetron Failed - 11/24/2021 12:23 PM      Failed - This refill cannot be delegated      Passed - AST in normal range and within 360 days    AST  Date Value Ref Range Status  09/27/2021 16 15 - 41 U/L Final         Passed - ALT in normal range and within 360 days    ALT  Date Value Ref Range Status  09/27/2021 15 0 - 44 U/L Final         Passed - Valid encounter within last 6 months    Recent Outpatient Visits           2 months ago Prediabetes   Primary Care at [facility], Amy J, NP   5 months ago Prediabetes   Primary Care at [facility], Cari S, PA-C   7 months ago Acute bilateral low back pain, unspecified whether sciatica present   Primary Care at [facility], Amy J, NP   7 months ago Acute bilateral low back pain, unspecified whether sciatica present   Primary Care at [facility], Amy J, NP   10 months ago Bacterial vaginitis   Primary Care at [facility], Flonnie Hailstone, NP       Future Appointments             In 6 days Camillia Herter, NP Primary Care at [facility]

## 2021-11-25 NOTE — Telephone Encounter (Signed)
Called patient to confirm PCP. Patient reports she is a current patient at [facility] and has upcoming appt 12/01/21.

## 2021-11-27 ENCOUNTER — Other Ambulatory Visit (HOSPITAL_COMMUNITY): Payer: Self-pay | Admitting: Emergency Medicine

## 2021-11-27 ENCOUNTER — Other Ambulatory Visit (HOSPITAL_COMMUNITY): Payer: Self-pay

## 2021-11-28 ENCOUNTER — Other Ambulatory Visit: Payer: Self-pay

## 2021-11-28 ENCOUNTER — Other Ambulatory Visit (HOSPITAL_COMMUNITY): Payer: Self-pay

## 2021-11-28 ENCOUNTER — Other Ambulatory Visit: Payer: Self-pay | Admitting: Family

## 2021-11-28 DIAGNOSIS — M545 Low back pain, unspecified: Secondary | ICD-10-CM

## 2021-11-29 ENCOUNTER — Other Ambulatory Visit (HOSPITAL_COMMUNITY): Payer: Self-pay

## 2021-11-30 ENCOUNTER — Emergency Department (HOSPITAL_COMMUNITY): Payer: Self-pay

## 2021-11-30 ENCOUNTER — Encounter (HOSPITAL_COMMUNITY): Payer: Self-pay

## 2021-11-30 ENCOUNTER — Other Ambulatory Visit: Payer: Self-pay

## 2021-11-30 ENCOUNTER — Emergency Department (HOSPITAL_COMMUNITY)
Admission: EM | Admit: 2021-11-30 | Discharge: 2021-11-30 | Disposition: A | Discharge status: Home / Self Care | Payer: Self-pay | Attending: Emergency Medicine | Admitting: Emergency Medicine

## 2021-11-30 DIAGNOSIS — E119 Type 2 diabetes mellitus without complications: Secondary | ICD-10-CM | POA: Insufficient documentation

## 2021-11-30 DIAGNOSIS — R110 Nausea: Secondary | ICD-10-CM | POA: Insufficient documentation

## 2021-11-30 DIAGNOSIS — N9489 Other specified conditions associated with female genital organs and menstrual cycle: Secondary | ICD-10-CM | POA: Insufficient documentation

## 2021-11-30 DIAGNOSIS — Z79899 Other long term (current) drug therapy: Secondary | ICD-10-CM | POA: Insufficient documentation

## 2021-11-30 DIAGNOSIS — I10 Essential (primary) hypertension: Secondary | ICD-10-CM | POA: Insufficient documentation

## 2021-11-30 DIAGNOSIS — R519 Headache, unspecified: Secondary | ICD-10-CM | POA: Insufficient documentation

## 2021-11-30 LAB — CBC
HCT: 39.5 % (ref 36.0–46.0)
Hemoglobin: 12.9 g/dL (ref 12.0–15.0)
MCH: 29.5 pg (ref 26.0–34.0)
MCHC: 32.7 g/dL (ref 30.0–36.0)
MCV: 90.4 fL (ref 80.0–100.0)
Platelets: 231 10*3/uL (ref 150–400)
RBC: 4.37 MIL/uL (ref 3.87–5.11)
RDW: 16 % — ABNORMAL HIGH (ref 11.5–15.5)
WBC: 6.5 10*3/uL (ref 4.0–10.5)
nRBC: 0 % (ref 0.0–0.2)

## 2021-11-30 LAB — BASIC METABOLIC PANEL
Anion gap: 6 (ref 5–15)
BUN: 9 mg/dL (ref 6–20)
CO2: 25 mmol/L (ref 22–32)
Calcium: 9.3 mg/dL (ref 8.9–10.3)
Chloride: 107 mmol/L (ref 98–111)
Creatinine, Ser: 0.85 mg/dL (ref 0.44–1.00)
GFR, Estimated: >60 mL/min (ref >60)
Glucose, Bld: 100 mg/dL — ABNORMAL HIGH (ref 70–99)
Potassium: 4.1 mmol/L (ref 3.5–5.1)
Sodium: 138 mmol/L (ref 135–145)

## 2021-11-30 LAB — I-STAT BETA HCG BLOOD, ED (MC, WL, AP ONLY): I-stat hCG, quantitative: <5 m[IU]/mL (ref <5)

## 2021-11-30 MED ORDER — DEXAMETHASONE SODIUM PHOSPHATE 10 MG/ML IJ SOLN
8.0000 mg | Freq: Once | INTRAMUSCULAR | Status: AC
  Administered 2021-11-30: 8 mg via INTRAVENOUS
  Filled 2021-11-30: qty 1

## 2021-11-30 MED ORDER — PROCHLORPERAZINE EDISYLATE 10 MG/2ML IJ SOLN
5.0000 mg | Freq: Once | INTRAMUSCULAR | Status: AC
  Administered 2021-11-30: 5 mg via INTRAVENOUS
  Filled 2021-11-30: qty 2

## 2021-11-30 MED ORDER — ONDANSETRON HCL 4 MG/2ML IJ SOLN
4.0000 mg | Freq: Once | INTRAMUSCULAR | Status: AC
  Administered 2021-11-30: 4 mg via INTRAVENOUS
  Filled 2021-11-30: qty 2

## 2021-11-30 MED ORDER — DIPHENHYDRAMINE HCL 50 MG/ML IJ SOLN
12.5000 mg | Freq: Once | INTRAMUSCULAR | Status: AC
  Administered 2021-11-30: 12.5 mg via INTRAVENOUS
  Filled 2021-11-30: qty 1

## 2021-11-30 MED ORDER — KETOROLAC TROMETHAMINE 15 MG/ML IJ SOLN
15.0000 mg | Freq: Once | INTRAMUSCULAR | Status: AC
  Administered 2021-11-30: 15 mg via INTRAVENOUS
  Filled 2021-11-30: qty 1

## 2021-11-30 NOTE — Discharge Instructions (Signed)
Please return to the ED with any new symptoms Please follow-up with your PCP for further management of your headaches

## 2021-11-30 NOTE — ED Notes (Signed)
Patient stated that she is feeling woozy. I informed her that she just got a benadryl, and that may make her feel that way, and that she should try to rest. Took her vitals and they were WNL. Provided patient with emesis bag. She is currently resting in bed

## 2021-11-30 NOTE — ED Triage Notes (Addendum)
Patient states she has had a headache and nausea for a week. Patient has taken tylenol 1 time, 2 days ago. States it did not work.

## 2021-11-30 NOTE — ED Provider Notes (Signed)
[place] DEPT Provider Note   CSN: [id number] Arrival date & time: 11/30/21  0553     History  Chief Complaint  Patient presents with   Nausea   Headache    Sherri [name] is a 36 y.o. female with medical history of diabetes, fibromyalgia, hypertension, headaches.  Patient presents to ED for evaluation of nausea and headache.  Patient states that for the last 1 week she has had persistent nausea and headache.  Patient reports that she has a history of headaches, this headache is a typical headache for her.  The patient reports that her headache is described as a "pounding sensation" that is associated in the frontal area.  Patient denies any new features of this headache.  Patient reports that she attempted to take Tylenol 1 time 2 days ago which did not alleviate her headache.  Patient also complaining of nausea for the last 1 week.  Patient denies any concerns of pregnancy.  Patient denies any alleviating or aggravating factors of her nausea.  Patient states that her appetite is fine.  Patient denies any fevers, vomiting, diarrhea, abdominal pain, one-sided weakness or numbness.   Headache Associated symptoms: nausea   Associated symptoms: no abdominal pain, no diarrhea, no fever, no numbness and no weakness        Home Medications Prior to Admission medications   Medication Sig Start Date End Date Taking? Authorizing Provider  amitriptyline (ELAVIL) 25 MG tablet Take 2 tablets (50 mg total) by mouth at bedtime. 06/24/21 09/22/21  Mayers, Cari S, PA-C  azelastine (ASTELIN) 0.1 % nasal spray Place 2 sprays into both nostrils 2 (two) times daily. 02/10/20   Tasia Catchings, Amy V, PA-C  benzonatate (TESSALON) 100 MG capsule Take 1 capsule (100 mg total) by mouth every 8 (eight) hours as needed for cough. 10/21/21   Teodora Medici, FNP  cetirizine (ZYRTEC ALLERGY) 10 MG tablet Take 1 tablet (10 mg total) by mouth daily. 12/31/20   Camillia Herter, NP  Cholecalciferol  (VITAMIN D3) 125 MCG (5000 UT) CAPS Take 1 capsule by mouth daily.    [provider]  clindamycin (CLEOCIN T) 1 % lotion Apply to cleansed face daily in the morning. 06/30/21     clindamycin (CLEOCIN) 2 % vaginal cream Place 1 Applicatorful vaginally at bedtime. 10/09/21   Donnamae Jude, MD  cyclobenzaprine (FLEXERIL) 5 MG tablet Take 1 tablet (5 mg total) by mouth at bedtime as needed for muscle spasms. 11/21/21   Camillia Herter, NP  famotidine (PEPCID) 20 MG tablet Take 1 tablet (20 mg total) by mouth 2 (two) times daily. 09/27/21   Carlisle Cater, PA-C  Ferrous Sulfate (IRON) 28 MG TABS Take 1 tablet by mouth daily.    [provider]  fluconazole (DIFLUCAN) 150 MG tablet Take 1 tablet (150 mg total) by mouth every 3 (three) days. 11/15/21   Talbot Grumbling, FNP  fluticasone (FLONASE) 50 MCG/ACT nasal spray Place 2 sprays into both nostrils daily. 11/26/20   Camillia Herter, NP  ibuprofen (ADVIL) 600 MG tablet Take 1 tablet (600 mg total) by mouth every 8 (eight) hours as needed. 12/31/20   Camillia Herter, NP  naproxen (NAPROSYN) 500 MG tablet Take 1 tablet (500 mg total) by mouth 2 (two) times daily. 0/16/01   Prince Rome, PA-C  ondansetron (ZOFRAN-ODT) 4 MG disintegrating tablet Dissolve 1 tablet (4 mg total) by mouth every 8 (eight) hours as needed for nausea or vomiting. 11/07/21  Carlisle Cater, PA-C  pantoprazole (PROTONIX) 40 MG tablet Take 1 tablet (40 mg total) by mouth daily. 11/17/21   Willia Craze, NP  polyethylene glycol powder (GLYCOLAX/MIRALAX) 17 GM/SCOOP powder Take 17 g by mouth daily as needed for mild constipation. 09/27/21   Carlisle Cater, PA-C  Prucalopride Succinate (MOTEGRITY) 2 MG TABS Take 1 tablet (2 mg total) by mouth daily. 09/18/21   Willia Craze, NP  sucralfate (CARAFATE) 1 g tablet Take 1 tablet (1 g total) by mouth 4 (four) times daily -  with meals and at bedtime. 09/27/21   Carlisle Cater, PA-C  tretinoin (RETIN-A) 0.025 % cream  Apply pea size amount to cleansed face every other day at bedtime. Increase to every night as tolerated. 06/30/21     triamcinolone cream (KENALOG) 0.1 % Apply to affected areas twice daily for 3 days on and 3 days off cycle or as needed. 06/30/21         Allergies    Macrobid [nitrofurantoin], Augmentin [amoxicillin-pot clavulanate], and Metformin and related    Review of Systems   Review of Systems  Constitutional:  Negative for fever.  Gastrointestinal:  Positive for nausea. Negative for abdominal pain and diarrhea.  Neurological:  Positive for headaches. Negative for weakness and numbness.    Physical Exam Updated Vital Signs BP 135/85   Pulse 66   Temp 98.7 F (37.1 C) (Oral)   Resp 20   Ht '5\' 3"'$  (1.6 m)   Wt 77.1 kg   LMP 11/15/2021 (Approximate)   SpO2 100%   BMI 30.11 kg/m  Physical Exam Vitals and nursing note reviewed.  Constitutional:      General: She is not in acute distress.    Appearance: Normal appearance. She is not ill-appearing, toxic-appearing or diaphoretic.  HENT:     Head: Normocephalic and atraumatic.     Nose: Nose normal. No congestion.     Mouth/Throat:     Mouth: Mucous membranes are moist.     Pharynx: Oropharynx is clear.  Eyes:     Extraocular Movements: Extraocular movements intact.     Conjunctiva/sclera: Conjunctivae normal.     Pupils: Pupils are equal, round, and reactive to light.  Cardiovascular:     Rate and Rhythm: Normal rate and regular rhythm.  Pulmonary:     Effort: Pulmonary effort is normal.     Breath sounds: Normal breath sounds. No wheezing.  Abdominal:     General: Abdomen is flat. Bowel sounds are normal.     Palpations: Abdomen is soft.     Tenderness: There is no abdominal tenderness.  Musculoskeletal:     Cervical back: Normal range of motion and neck supple. No tenderness.  Skin:    General: Skin is warm and dry.     Capillary Refill: Capillary refill takes less than 2 seconds.  Neurological:     General: No  focal deficit present.     Mental Status: She is alert and oriented to person, place, and time.     GCS: GCS eye subscore is 4. GCS verbal subscore is 5. GCS motor subscore is 6.     Cranial Nerves: Cranial nerves 2-12 are intact. No cranial nerve deficit.     Sensory: Sensation is intact. No sensory deficit.     Motor: Motor function is intact. No weakness.     Coordination: Coordination is intact. Heel to [facility] Test normal.     ED Results / Procedures / Treatments   Labs (all labs  ordered are listed, but only abnormal results are displayed) Labs Reviewed  CBC - Abnormal; Notable for the following components:      Result Value   RDW 16.0 (*)    All other components within normal limits  BASIC METABOLIC PANEL - Abnormal; Notable for the following components:   Glucose, Bld 100 (*)    All other components within normal limits  I-STAT BETA HCG BLOOD, ED ([facility], [facility], [facility] ONLY)    EKG None  Radiology CT Head Wo Contrast  Result Date: 11/30/2021 CLINICAL DATA:  Headache, chronic.  Nausea EXAM: CT HEAD WITHOUT CONTRAST TECHNIQUE: Contiguous axial images were obtained from the base of the skull through the vertex without intravenous contrast. RADIATION DOSE REDUCTION: This exam was performed according to the departmental dose-optimization program which includes automated exposure control, adjustment of the mA and/or kV according to patient size and/or use of iterative reconstruction technique. COMPARISON:  None Available. FINDINGS: Brain: No acute intracranial hemorrhage. No focal mass lesion. No CT evidence of acute infarction. No midline shift or mass effect. No hydrocephalus. Basilar cisterns are patent. Vascular: No hyperdense vessel or unexpected calcification. Skull: Normal. Negative for fracture or focal lesion. Sinuses/Orbits: Paranasal sinuses and mastoid air cells are clear. Orbits are clear. Other: None. IMPRESSION: Normal head CT Electronically Signed   By: Suzy Bouchard M.D.   On:  11/30/2021 10:13    Procedures Procedures   Medications Ordered in ED Medications  dexamethasone (DECADRON) injection 8 mg (8 mg Intravenous Given 11/30/21 0738)  ketorolac (TORADOL) 15 MG/ML injection 15 mg (15 mg Intravenous Given 11/30/21 0738)  ondansetron (ZOFRAN) injection 4 mg (4 mg Intravenous Given 11/30/21 0737)  prochlorperazine (COMPAZINE) injection 5 mg (5 mg Intravenous Given 11/30/21 0837)  diphenhydrAMINE (BENADRYL) injection 12.5 mg (12.5 mg Intravenous Given 11/30/21 0835)  diphenhydrAMINE (BENADRYL) injection 12.5 mg (12.5 mg Intravenous Given 11/30/21 0941)    ED Course/ Medical Decision Making/ A&P                           Medical Decision Making  36 year old female presents to the ED for evaluation of her headaches.  Please see HPI for further details.  On examination the patient is in no apparent distress.  The patient is afebrile and nontachycardic.  The patient lung sounds are clear bilaterally, she is not hypoxic.  The patient abdomen is soft and compressible.  The patient neurological examination shows no focal neurodeficits.  Initially the patient was treated with 15 mg of Toradol as well as 10 mg Decadron to prevent recurrence of headaches.  The patient was also given 4 mg of IV Zofran for her nausea.  Labs were collected to include CBC, BMP.  Patient labs are unremarkable.  After the patient was given Toradol, Decadron and she stated that her headache had decreased from 9 out of 10 to 6 out of 10 however she was still complaining of a "pressure".  Shared decision-making conversation was then had and advised the patient that I could further give her medication to try and treat her headache however it would cause her to become drowsy, the patient stated that she would like to explore this option.  The patient was given 5 mg Compazine, 12 and half milligrams of Benadryl.  After this was administered, the patient began complaining of restlessness most likely due to  akathisia.  The patient was then given another 12-1/2 mg of Benadryl which she stated alleviated her restlessness.  The patient then had a head CT done that did not show any intracranial abnormality or pathology.  At this time, patient states that her nausea as well as headache have relieved.  The patient will be discharged home and advised to follow-up with her PCP for further management.  The patient was given return precautions and she voiced understanding.  The patient had all of her questions answered to her satisfaction.  The patient stable at this time for discharge home.  Final Clinical Impression(s) / ED Diagnoses Final diagnoses:  Bad headache    Rx / DC Orders ED Discharge Orders     None         Azucena Cecil, PA-C 11/30/21 1042    Davonna Belling, MD 11/30/21 1454

## 2021-12-01 ENCOUNTER — Ambulatory Visit (INDEPENDENT_AMBULATORY_CARE_PROVIDER_SITE_OTHER): Payer: Self-pay | Admitting: Family

## 2021-12-01 ENCOUNTER — Encounter: Payer: Self-pay | Admitting: Family

## 2021-12-01 ENCOUNTER — Other Ambulatory Visit: Payer: Self-pay

## 2021-12-01 ENCOUNTER — Other Ambulatory Visit (HOSPITAL_COMMUNITY): Payer: Self-pay | Admitting: Emergency Medicine

## 2021-12-01 ENCOUNTER — Telehealth: Payer: Self-pay | Admitting: Family

## 2021-12-01 ENCOUNTER — Ambulatory Visit: Payer: Self-pay | Admitting: Dermatology

## 2021-12-01 VITALS — BP 126/78 | HR 77 | Temp 98.3°F | Resp 16 | Ht 59.02 in | Wt 186.0 lb

## 2021-12-01 DIAGNOSIS — G8929 Other chronic pain: Secondary | ICD-10-CM

## 2021-12-01 DIAGNOSIS — R519 Headache, unspecified: Secondary | ICD-10-CM

## 2021-12-01 DIAGNOSIS — F32A Depression, unspecified: Secondary | ICD-10-CM

## 2021-12-01 DIAGNOSIS — R300 Dysuria: Secondary | ICD-10-CM

## 2021-12-01 DIAGNOSIS — M549 Dorsalgia, unspecified: Secondary | ICD-10-CM

## 2021-12-01 DIAGNOSIS — R7303 Prediabetes: Secondary | ICD-10-CM

## 2021-12-01 DIAGNOSIS — F419 Anxiety disorder, unspecified: Secondary | ICD-10-CM

## 2021-12-01 DIAGNOSIS — Z599 Problem related to housing and economic circumstances, unspecified: Secondary | ICD-10-CM

## 2021-12-01 LAB — POCT URINALYSIS DIP (CLINITEK)
Bilirubin, UA: NEGATIVE
Glucose, UA: NEGATIVE mg/dL
Leukocytes, UA: NEGATIVE
Nitrite, UA: NEGATIVE
POC PROTEIN,UA: NEGATIVE
Spec Grav, UA: 1.01 (ref 1.010–1.025)
Urobilinogen, UA: 0.2 E.U./dL
pH, UA: 6 (ref 5.0–8.0)

## 2021-12-01 MED ORDER — SUMATRIPTAN SUCCINATE 25 MG PO TABS
ORAL_TABLET | ORAL | 1 refills | Status: DC
  Filled 2021-12-01: qty 9, 30d supply, fill #0

## 2021-12-01 NOTE — Telephone Encounter (Signed)
Rx #: [id number]  ondansetron (ZOFRAN-ODT) 4 MG disintegrating tablet [[id number]]   Pharmacy  [address], [place] [address]  Phone:  [phone]  Fax:  [phone]     Pt states this was prescribed from emergency room but was just mentioned to PCP in appt she had 12/01/21 but wanted to request this again before she left.

## 2021-12-01 NOTE — Progress Notes (Signed)
Pt presents for ED follow-up -BP elevated at emergency room on yesterday -throbbing headache for days

## 2021-12-02 ENCOUNTER — Other Ambulatory Visit: Payer: Self-pay

## 2021-12-02 ENCOUNTER — Encounter: Payer: Self-pay | Admitting: Neurology

## 2021-12-02 LAB — HEMOGLOBIN A1C
Est. average glucose Bld gHb Est-mCnc: 126 mg/dL
Hgb A1c MFr Bld: 6 % — ABNORMAL HIGH (ref 4.8–5.6)

## 2021-12-03 ENCOUNTER — Other Ambulatory Visit: Payer: Self-pay

## 2021-12-04 ENCOUNTER — Encounter: Payer: Self-pay | Admitting: Nurse Practitioner

## 2021-12-04 ENCOUNTER — Ambulatory Visit (INDEPENDENT_AMBULATORY_CARE_PROVIDER_SITE_OTHER): Payer: Self-pay | Admitting: Nurse Practitioner

## 2021-12-04 VITALS — BP 136/72 | HR 87 | Ht 59.0 in | Wt 183.5 lb

## 2021-12-04 DIAGNOSIS — K5909 Other constipation: Secondary | ICD-10-CM

## 2021-12-04 DIAGNOSIS — R112 Nausea with vomiting, unspecified: Secondary | ICD-10-CM

## 2021-12-04 DIAGNOSIS — D509 Iron deficiency anemia, unspecified: Secondary | ICD-10-CM

## 2021-12-04 MED ORDER — ONDANSETRON 4 MG PO TBDP: 4.0000 mg | ORAL_TABLET | Freq: Three times a day (TID) | ORAL | 3 refills | Status: DC | PRN

## 2021-12-04 MED ORDER — NA SULFATE-K SULFATE-MG SULF 17.5-3.13-1.6 GM/177ML PO SOLN: 1.0000 | Freq: Once | ORAL | 0 refills | Status: AC

## 2021-12-04 NOTE — Progress Notes (Signed)
Chief Complaint:  continued GI issues   Assessment &  Plan   # 36 yo female with chronic generalized abdominal pain, chronic constipation, chronic nausea and vomiting.  Extensive abdominal imaging have been unrevealing as have labs.  Symptoms refractory to multiple medications not limited to MiraLAX, Amitiza, Motegrity, Robinul Forte, and hyoscyamine.  Unable to tolerate Linzess.  Symptoms suspected to be functional but given persistence as well as history of iron deficiency anemia we had scheduled her for an EGD and colonoscopy but she was unable to have it done.  Constipation has actually improved by drinking prune juice every day.  For further evaluation of constipation as well as history of iron deficiency anemia will reschedule colonoscopy. Gave her the telephone number to a company that offers care partners for procedures  Describes nearly constant nausea with intermittent vomiting.  No associated weight loss.  Will proceed with EGD for evaluation of iron deficiency anemia and chronic nausea and vomiting. The risks and benefits of EGD with possible biopsies were discussed with the patient who agrees to proceed.   If EGD negative consider gastric emptying study.  Will refill zofran per her request, takes prn  #History of iron deficiency anemia.  Hemoglobin stable at 12.9 in late July.  On oral iron hold iron 7 days prior to colonoscopy  HPI   Sherri [name] is a 36 y.o. female known to Dr.  Fuller Plan with a past medical history significant for GERD /hiatal hernia, chronic constipation, chronic abdominal pain, obesity, fibromyalgia, hypertension.  Additional medical history as listed in the [place]. X. See PMH /PSH for additional history   Sherri [name] was last seen late July for evaluation of ongoing chronic nausea and vomiting, chronic abdominal pain and chronic constipation.  Constipation has been refractory to multiple agents.  She did not tolerate Linzess.  At time of her July visit she was  given a trial of Motegrity, because of her history of iron deficiency anemia we scheduled her for an EGD and colonoscopy.  She was not able to go through with the procedures because she did not have a care partner  Interval History:  Motegrity caused nausea. She took for a week without improvement in constipation.  She continues to have nearly constant nausea with intermittent vomiting.  No associated weight loss.  She has generalized abdominal pain and chronic constipation which has actually improved with prune juice.    Labs:     Latest Ref Rng & Units 11/30/2021    7:35 AM 09/27/2021   10:10 AM 09/18/2021   11:08 AM  CBC  WBC 4.0 - 10.5 K/uL 6.5  5.9  6.6   Hemoglobin 12.0 - 15.0 g/dL 12.9  12.9  13.2   Hematocrit 36.0 - 46.0 % 39.5  39.5  40.2   Platelets 150 - 400 K/uL 231  260  279.0        Latest Ref Rng & Units 09/27/2021   10:10 AM 01/21/2021    3:25 PM 09/05/2020    6:56 AM  Hepatic Function  Total Protein 6.5 - 8.1 g/dL 7.7  7.3  7.9   Albumin 3.5 - 5.0 g/dL 4.1  4.6  4.5   AST 15 - 41 U/L _0 ALT 0 - 44 U/L _1 Alk Phosphatase 38 - 126 U/L 69  97  64   Total Bilirubin 0.3 - 1.2 mg/dL 0.4  <0.2  0.5  Past Medical History:  Diagnosis Date   Diabetes mellitus without complication ([place])    Fibromyalgia    denies today   Genital herpes    Hypertension    gestational   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     Current Outpatient Medications  Medication Sig Dispense Refill   azelastine (ASTELIN) 0.1 % nasal spray  Place 2 sprays into both nostrils 2 (two) times daily. 30 mL 0   cetirizine (ZYRTEC ALLERGY) 10 MG tablet Take 1 tablet (10 mg total) by mouth daily. 30 tablet 11   Cholecalciferol (VITAMIN D3) 125 MCG (5000 UT) CAPS Take 1 capsule by mouth daily.     clindamycin (CLEOCIN T) 1 % lotion Apply to cleansed face daily in the morning. 60 mL 1   clindamycin (CLEOCIN) 2 % vaginal cream Place 1 Applicatorful vaginally at bedtime. 40 g 0   cyclobenzaprine (FLEXERIL) 5 MG tablet Take 1 tablet (5 mg total) by mouth at bedtime as needed for muscle spasms. 30 tablet 2   famotidine (PEPCID) 20 MG tablet Take 1 tablet (20 mg total) by mouth 2 (two) times daily. 30 tablet 0   Ferrous Sulfate (IRON) 28 MG TABS Take 1 tablet by mouth daily.     fluconazole (DIFLUCAN) 150 MG tablet Take 1 tablet (150 mg total) by mouth every 3 (three) days. 2 tablet 0   fluticasone (FLONASE) 50 MCG/ACT nasal spray Place 2 sprays into both nostrils daily. 16 g 0   ibuprofen (ADVIL) 600 MG tablet Take 1 tablet (600 mg total) by mouth every 8 (eight) hours as needed. 30 tablet 0   naproxen (NAPROSYN) 500 MG tablet Take 1 tablet (500 mg total) by mouth 2 (two) times daily. 30 tablet 0   ondansetron (ZOFRAN-ODT) 4 MG disintegrating tablet Dissolve 1 tablet (4 mg total) by mouth every 8 (eight) hours as needed for nausea or vomiting. 10 tablet 0   pantoprazole (PROTONIX) 40 MG tablet Take 1 tablet (40 mg total) by mouth daily. 30 tablet 6   polyethylene glycol powder (GLYCOLAX/MIRALAX) 17 GM/SCOOP powder Take 17 g by mouth daily as needed for mild constipation. 238 g 0   sucralfate (CARAFATE) 1 g tablet Take 1 tablet (1 g total) by mouth 4 (four) times daily -  with meals and at bedtime. 60 tablet 0   SUMAtriptan (IMITREX) 25 MG tablet Take 25 mg (1 tablet total) by mouth at the start of the headache. May repeat in 2 hours x 1 if headache persists. Max of 2 tablets/24 hours. 30 tablet 1   tretinoin (RETIN-A) 0.025 % cream Apply pea size  amount to cleansed face every other day at bedtime. Increase to every night as tolerated. 20 g 0   triamcinolone cream (KENALOG) 0.1 % Apply to affected areas twice daily for 3 days on and 3 days off cycle or as needed. 80 g 0   amitriptyline (ELAVIL) 25 MG tablet Take 2 tablets (50 mg total) by mouth at bedtime. 60 tablet 2   benzonatate (TESSALON) 100 MG capsule Take 1  capsule (100 mg total) by mouth every 8 (eight) hours as needed for cough. (Patient not taking: Reported on 12/04/2021) 21 capsule 0   Prucalopride Succinate (MOTEGRITY) 2 MG TABS Take 1 tablet (2 mg total) by mouth daily. (Patient not taking: Reported on 12/04/2021) 30 tablet 3   No current facility-administered medications for this visit.    Review of Systems: No chest pain. No shortness of breath. No urinary complaints.    Physical Exam  Wt Readings from Last 3 Encounters:  12/04/21 183 lb 8 oz (83.2 kg)  12/01/21 186 lb (84.4 kg)  11/30/21 170 lb (77.1 kg)    BP 136/72   Pulse 87   Ht 4' 11" (1.499 m)   Wt 183 lb 8 oz (83.2 kg)   LMP 11/15/2021 (Approximate)   BMI 37.06 kg/m  Constitutional:  Generally well appearing female in no acute distress. Psychiatric: Pleasant. Normal mood and affect. Behavior is normal. EENT: Pupils normal.  Conjunctivae are normal. No scleral icterus. Neck supple.  Cardiovascular: Normal rate, regular rhythm. No edema Pulmonary/chest: Effort normal and breath sounds normal. No wheezing, rales or rhonchi. Abdominal: Soft, nondistended, nontender. Bowel sounds active throughout. There are no masses palpable. No hepatomegaly. Neurological: Alert and oriented to person place and time. Skin: Skin is warm and dry. No rashes noted.  Tye Savoy, NP  12/04/2021, 3:01 PM

## 2021-12-04 NOTE — Patient Instructions (Signed)
We have sent the following medications to your pharmacy for you to pick up at your convenience: Suprep and Zofran   You have been scheduled for an endoscopy and colonoscopy. Please follow the written instructions given to you at your visit today. Please pick up your prep supplies at the pharmacy within the next 1-3 days. If you use inhalers (even only as needed), please bring them with you on the day of your procedure.  _______________________________________________________  If you are age 20 or older, your body mass index should be between 23-30. Your Body mass index is 37.06 kg/m. If this is out of the aforementioned range listed, please consider follow up with your Primary Care Provider.  If you are age 68 or younger, your body mass index should be between 19-25. Your Body mass index is 37.06 kg/m. If this is out of the aformentioned range listed, please consider follow up with your Primary Care Provider.   ________________________________________________________  The [facility] providers would like to encourage you to use [facility] to communicate with providers for non-urgent requests or questions.  Due to long hold times on the telephone, sending your provider a message by [facility] may be a faster and more efficient way to get a response.  Please allow 48 business hours for a response.  Please remember that this is for non-urgent requests.  _______________________________________________________  Thank you for choosing me and [facility].

## 2021-12-08 ENCOUNTER — Telehealth: Payer: Self-pay | Admitting: Nurse Practitioner

## 2021-12-08 MED ORDER — SUCRALFATE 1 G PO TABS: 1.0000 g | ORAL_TABLET | Freq: Three times a day (TID) | ORAL | 0 refills | Status: DC

## 2021-12-08 MED ORDER — ONDANSETRON 4 MG PO TBDP: 4.0000 mg | ORAL_TABLET | Freq: Three times a day (TID) | ORAL | 3 refills | Status: DC | PRN

## 2021-12-08 NOTE — Telephone Encounter (Signed)
Returned call to patient. She states that she had to switch her pharmacy to [facility] and she would like Zofran and Carafate sent there. Pt had not other concerns at the end of the call.

## 2021-12-08 NOTE — Telephone Encounter (Signed)
Patient requesting refill on zofran and carafate. Please advise.

## 2021-12-09 ENCOUNTER — Ambulatory Visit
Admission: EM | Admit: 2021-12-09 | Discharge: 2021-12-09 | Disposition: A | Discharge status: Home / Self Care | Payer: Self-pay | Attending: Emergency Medicine | Admitting: Emergency Medicine

## 2021-12-09 ENCOUNTER — Ambulatory Visit
Admission: EM | Admit: 2021-12-09 | Discharge: 2021-12-09 | Discharge status: Absent without leave | Payer: Medicaid Other

## 2021-12-09 DIAGNOSIS — J0140 Acute pansinusitis, unspecified: Secondary | ICD-10-CM

## 2021-12-09 MED ORDER — FLUTICASONE PROPIONATE 50 MCG/ACT NA SUSP: 2.0000 | Freq: Every day | NASAL | 0 refills | Status: DC

## 2021-12-09 MED ORDER — NAPROXEN 500 MG PO TABS: 500.0000 mg | ORAL_TABLET | Freq: Two times a day (BID) | ORAL | 0 refills | Status: DC

## 2021-12-09 MED ORDER — DEXAMETHASONE SODIUM PHOSPHATE 10 MG/ML IJ SOLN
10.0000 mg | Freq: Once | INTRAMUSCULAR | Status: AC
  Administered 2021-12-09: 10 mg via INTRAMUSCULAR

## 2021-12-09 MED ORDER — DOXYCYCLINE HYCLATE 100 MG PO CAPS: 100.0000 mg | ORAL_CAPSULE | Freq: Two times a day (BID) | ORAL | 0 refills | Status: AC

## 2021-12-09 NOTE — ED Provider Notes (Signed)
HPI  SUBJECTIVE:  Sherri [name] is a 36 y.o. female who reports over 10 days of throbbing, intermittent frontal, maxillary sinus pain and pressure, purulent nasal congestion/rhinorrhea, upper dental pain, postnasal drip, cough productive of the same material as her nasal congestion.  She states that she got better, and then got worse.  No fevers, sore throat, allergy symptoms.  No antibiotics in the past month.  No antipyretic in the past 6 hours.  She has been taking Zyrtec, ibuprofen combined with Tylenol without improvement in her symptoms.  No alleviating factors.  Symptoms worse with bending forward.  She has a past medical history of hypertension, headaches, allergies, prediabetes.  LMP: The end of September.  Denies the possibility of being pregnant.  PCP: [facility].  Patient was seen in the ED on 9/16 and 10/1 with headaches.  She has followed up with her PCP and has been referred to neurology.  She states that today's headache is different, more like a sinusitis, rather than the headache that she was evaluated for in the ED.   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    Urinary tract infection    Uterine fibroid     Past Surgical History:  Procedure Laterality Date   CESAREAN SECTION N/A 06/05/2014   Procedure: CESAREAN SECTION;  Surgeon: Truett Mainland, DO;  Location: [facility];  Service: Obstetrics;  Laterality: N/A;   WISDOM TOOTH EXTRACTION      Family History  Problem Relation Age of Onset   Hypertension Mother    Hypertension Father    Breast cancer Maternal Grandmother        25   Colon cancer Neg Hx    Colon polyps Neg Hx    Kidney disease Neg Hx    Diabetes Neg Hx    Esophageal cancer Neg Hx    Gallbladder disease Neg Hx    Heart disease Neg Hx    Asthma Neg Hx     Stroke Neg Hx    Stomach cancer Neg Hx     Social History   Tobacco Use   Smoking status: Never   Smokeless tobacco: Never  Vaping Use   Vaping Use: Never used  Substance Use Topics   Alcohol use: No   Drug use: No    No current facility-administered medications for this encounter.  Current Outpatient Medications:    doxycycline (VIBRAMYCIN) 100 MG capsule, Take 1 capsule (100 mg total) by mouth 2 (two) times daily for 10 days., Disp: 20 capsule, Rfl: 0   fluticasone (FLONASE) 50 MCG/ACT nasal spray, Place 2 sprays into both nostrils daily., Disp: 16 g, Rfl: 0   naproxen (NAPROSYN) 500 MG tablet, Take 1 tablet (500 mg total) by mouth 2 (two) times daily., Disp: 20 tablet, Rfl: 0   amitriptyline (ELAVIL) 25 MG tablet, Take 2 tablets (50 mg total) by mouth at bedtime., Disp: 60 tablet, Rfl: 2   cetirizine (ZYRTEC ALLERGY) 10 MG tablet, Take 1 tablet (10 mg total) by mouth daily., Disp: 30 tablet, Rfl: 11   Cholecalciferol (VITAMIN D3) 125 MCG (5000 UT) CAPS, Take 1 capsule by mouth daily., Disp: , Rfl:    clindamycin (CLEOCIN T) 1 % lotion, Apply to cleansed face daily in the morning., Disp: 60 mL, Rfl: 1  clindamycin (CLEOCIN) 2 % vaginal cream, Place 1 Applicatorful vaginally at bedtime., Disp: 40 g, Rfl: 0   cyclobenzaprine (FLEXERIL) 5 MG tablet, Take 1 tablet (5 mg total) by mouth at bedtime as needed for muscle spasms., Disp: 30 tablet, Rfl: 2   famotidine (PEPCID) 20 MG tablet, Take 1 tablet (20 mg total) by mouth 2 (two) times daily., Disp: 30 tablet, Rfl: 0   Ferrous Sulfate (IRON) 28 MG TABS, Take 1 tablet by mouth daily., Disp: , Rfl:    fluconazole (DIFLUCAN) 150 MG tablet, Take 1 tablet (150 mg total) by mouth every 3 (three) days., Disp: 2 tablet, Rfl: 0   fluticasone (FLONASE) 50 MCG/ACT nasal spray, Place 2 sprays into both nostrils daily., Disp: 16 g, Rfl: 0   ondansetron (ZOFRAN-ODT) 4 MG disintegrating tablet, Dissolve 1 tablet (4 mg total) by mouth every 8 (eight)  hours as needed for nausea or vomiting., Disp: 40 tablet, Rfl: 3   pantoprazole (PROTONIX) 40 MG tablet, Take 1 tablet (40 mg total) by mouth daily., Disp: 30 tablet, Rfl: 6   polyethylene glycol powder (GLYCOLAX/MIRALAX) 17 GM/SCOOP powder, Take 17 g by mouth daily as needed for mild constipation., Disp: 238 g, Rfl: 0   Prucalopride Succinate (MOTEGRITY) 2 MG TABS, Take 1 tablet (2 mg total) by mouth daily. (Patient not taking: Reported on 12/04/2021), Disp: 30 tablet, Rfl: 3   sucralfate (CARAFATE) 1 g tablet, Take 1 tablet (1 g total) by mouth 4 (four) times daily -  with meals and at bedtime., Disp: 60 tablet, Rfl: 0   SUMAtriptan (IMITREX) 25 MG tablet, Take 25 mg (1 tablet total) by mouth at the start of the headache. May repeat in 2 hours x 1 if headache persists. Max of 2 tablets/24 hours., Disp: 30 tablet, Rfl: 1   tretinoin (RETIN-A) 0.025 % cream, Apply pea size amount to cleansed face every other day at bedtime. Increase to every night as tolerated., Disp: 20 g, Rfl: 0   triamcinolone cream (KENALOG) 0.1 %, Apply to affected areas twice daily for 3 days on and 3 days off cycle or as needed., Disp: 80 g, Rfl: 0  Allergies  Allergen Reactions   Macrobid [Nitrofurantoin] Nausea And Vomiting   Augmentin [Amoxicillin-Pot Clavulanate]     Pt states it makes her sick   Metformin And Related Nausea And Vomiting     ROS  As noted in HPI.   Physical Exam  BP 129/82 (BP Location: Right Arm)   Pulse 74   Temp 98.2 F (36.8 C) (Oral)   Resp 16   LMP 11/15/2021 (Approximate)   SpO2 98%   Constitutional: Well developed, well nourished, no acute distress Eyes: PERRL, EOMI, conjunctiva normal bilaterally.  HENT: Normocephalic, atraumatic,mucus membranes moist.  Normal turbinates.  Positive purulent nasal congestion.  Positive exquisite maxillary, frontal sinus tenderness.  No appreciable facial swelling.  Normal tonsils, normal oropharynx.  Positive postnasal drip. Respiratory: normal  inspiratory effort, lungs clear bilaterally Cardiovascular: Normal rate, regular rhythm GI:  nondistended skin: No rash, skin intact Musculoskeletal: No edema, no tenderness, no deformities Neurologic: Alert & oriented x 3, CN III-XII grossly intact,  Psychiatric: Speech and behavior appropriate   ED Course  Medications  dexamethasone (DECADRON) injection 10 mg (10 mg Intramuscular Given 12/09/21 1210)    No orders of the defined types were placed in this encounter.  No results found for this or any previous visit (from the past 24 hour(s)). No results found.   ED Clinical Impression  1. Acute non-recurrent pansinusitis     ED Assessment/Plan     Previous records reviewed.  As noted in HPI.  Her presentation is most consistent with an acute pansinusitis.  She has maxillary, frontal sinus tenderness, purulent nasal congestion and postnasal drip.  Will send home with doxycycline, as she states that Augmentin causes nausea and vomiting.  She denies anaphylaxis.  We will have her discontinue Zyrtec, start Mucinex, saline nasal irrigation, Flonase, doxycycline for 10 days, Aleve/Tylenol as she states that ibuprofen/Tylenol has not worked.  Will give 10 mg of dexamethasone IM x1 for symptomatic relief, as patient states that this has worked well for her in the past, and she does not get paid until Friday, so will not be able to afford any medications until then.  Follow-up with PCP as needed.  Meds ordered this encounter  Medications   dexamethasone (DECADRON) injection 10 mg   doxycycline (VIBRAMYCIN) 100 MG capsule    Sig: Take 1 capsule (100 mg total) by mouth 2 (two) times daily for 10 days.    Dispense:  20 capsule    Refill:  0   fluticasone (FLONASE) 50 MCG/ACT nasal spray    Sig: Place 2 sprays into both nostrils daily.    Dispense:  16 g    Refill:  0   naproxen (NAPROSYN) 500 MG tablet    Sig: Take 1 tablet (500 mg total) by mouth 2 (two) times daily.    Dispense:   20 tablet    Refill:  0    *This clinic note was created using [employer]. Therefore, there may be occasional mistakes despite careful proofreading.  ?    Melynda Ripple, MD 12/09/21 1221

## 2021-12-09 NOTE — ED Triage Notes (Signed)
Patient presents to UC for sinus infection. She states she has nasal congestion and facial pain x 1 week. Takes zyrtec.

## 2021-12-09 NOTE — Discharge Instructions (Addendum)
Finish the doxycycline, even if you feel better.  I have given you 10 mg of dexamethasone injection today to help with pain and swelling.  Discontinue Zyrtec for now, start Mucinex instead.  You can restart the Zyrtec when you are feeling better and if your allergies start to bother you.  Saline nasal irrigation with a [employer]Med sinus rinse and distilled water as often as you want.  Follow the directions on the box.  Try Flonase for the nasal congestion, and Aleve combined with 1000 mg of Tylenol twice a day to help with pain.

## 2021-12-22 ENCOUNTER — Ambulatory Visit
Admission: EM | Admit: 2021-12-22 | Discharge: 2021-12-22 | Disposition: A | Discharge status: Home / Self Care | Payer: Self-pay | Attending: Internal Medicine | Admitting: Internal Medicine

## 2021-12-22 ENCOUNTER — Other Ambulatory Visit (HOSPITAL_COMMUNITY): Payer: Self-pay

## 2021-12-22 DIAGNOSIS — G5601 Carpal tunnel syndrome, right upper limb: Secondary | ICD-10-CM | POA: Insufficient documentation

## 2021-12-22 DIAGNOSIS — J309 Allergic rhinitis, unspecified: Secondary | ICD-10-CM | POA: Insufficient documentation

## 2021-12-22 DIAGNOSIS — N760 Acute vaginitis: Secondary | ICD-10-CM | POA: Insufficient documentation

## 2021-12-22 LAB — POCT URINALYSIS DIP (MANUAL ENTRY)
Bilirubin, UA: NEGATIVE
Blood, UA: NEGATIVE
Glucose, UA: NEGATIVE mg/dL
Ketones, POC UA: NEGATIVE mg/dL
Leukocytes, UA: NEGATIVE
Nitrite, UA: NEGATIVE
Protein Ur, POC: NEGATIVE mg/dL
Spec Grav, UA: 1.025 (ref 1.010–1.025)
Urobilinogen, UA: 0.2 E.U./dL
pH, UA: 6 (ref 5.0–8.0)

## 2021-12-22 MED ORDER — FLUCONAZOLE 150 MG PO TABS: 150.0000 mg | ORAL_TABLET | Freq: Once | ORAL | 0 refills | Status: AC

## 2021-12-22 MED ORDER — MELOXICAM 7.5 MG PO TABS: 7.5000 mg | ORAL_TABLET | Freq: Every day | ORAL | 0 refills | Status: DC

## 2021-12-22 MED ORDER — DEXAMETHASONE SODIUM PHOSPHATE 10 MG/ML IJ SOLN
10.0000 mg | Freq: Once | INTRAMUSCULAR | Status: AC
  Administered 2021-12-22: 10 mg via INTRAMUSCULAR

## 2021-12-22 NOTE — ED Triage Notes (Signed)
Pt c/o right hand numbness that starts at the wrist and goes into the fingertips on both anterior/posterior lateral/medial sides.   Also c/o vaginal burning, itching, irritation, dysuria, frequency, discharge, malodor. Onset ~ 1 week.   Also c/o headache, nasal congestion, cough, sneezing, onset ~ 1 week ago.   Pt not best historian unable to verify allergies and med list.

## 2021-12-22 NOTE — ED Provider Notes (Signed)
[facility]    CSN: [id number] Arrival date & time: 12/22/21  0810      History   Chief Complaint Chief Complaint  Patient presents with   right hand numbess/congestion/sti screening    HPI Sherri [name] is a 36 y.o. female comes to the urgent care with 1 week history of persistent nasal congestion, yellowish nasal discharge, facial pain and postnasal drainage.  Patient was recently treated for acute sinusitis.  She completed a course of antibiotics.  She says nasal congestion has not improved significantly.  She is using Flonase, humidifier, and staying hydrated.  No shortness of breath or wheezing.  No headaches or dizziness.  Patient complains of right hand numbness without weakness in the right hand.  This is associated with right wrist pain.  She works at [employer] and her job requires repetitive hand movement.  No shoulder pain.  No trauma to the right wrist.  No bruising of the right wrist.  This is the first episode that she has had.  Patient also complains of whitish thick vaginal discharge.  Malodorous discharge.  She has superficial dyspareunia.  Sexual partners are unchanged.  She is in a monogamous relationship.  No fever or chills.   HPI  Past Medical History:  Diagnosis Date   Diabetes mellitus without complication ([place])    Fibromyalgia    denies today   Genital herpes    Hypertension    gestational   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    Urinary tract infection    Uterine fibroid     Patient Active Problem List   Diagnosis Date Noted   Pre-diabetes 07/23/2021   Anxiety and depression 06/24/2021   Fibromyalgia 06/24/2021   Prediabetes 07/05/2020   Gastroesophageal reflux disease 04/26/2020   Perianal rash 03/26/2020   Chronic nausea 03/26/2020   Chronic constipation 03/09/2019   Pelvic pain in female 03/09/2019   Hypertension    Genital herpes    Uterine fibroid 12/25/2013   Obesity 12/25/2013   Bacterial  vaginitis 04/27/2012    Past Surgical History:  Procedure Laterality Date   CESAREAN SECTION N/A 06/05/2014   Procedure: CESAREAN SECTION;  Surgeon: Truett Mainland, DO;  Location: [facility];  Service: Obstetrics;  Laterality: N/A;   WISDOM TOOTH EXTRACTION      OB History     Gravida  1   Para  1   Term      Preterm  1   AB      Living  2      SAB      IAB      Ectopic      Multiple  1   Live Births  2            Home Medications    Prior to Admission medications   Medication Sig Start Date End Date Taking? Authorizing Provider  fluconazole (DIFLUCAN) 150 MG tablet Take 1 tablet (150 mg total) by mouth once for 1 dose. These take second dose 3 days after the first 1 if symptoms are persistent 12/22/21 12/22/21 Yes Overton Boggus, Myrene Galas, MD  meloxicam (MOBIC) 7.5 MG tablet Take 1 tablet (7.5 mg total) by mouth daily. 12/22/21  Yes Sharisse Rantz, Myrene Galas, MD  amitriptyline (ELAVIL) 25 MG tablet Take 2 tablets (50 mg total) by mouth at bedtime. 06/24/21 09/22/21  Mayers, Cari S, PA-C  cetirizine (ZYRTEC ALLERGY) 10 MG tablet Take 1 tablet (10 mg total) by mouth daily. 12/31/20  Minette Brine, Amy J, NP  Cholecalciferol (VITAMIN D3) 125 MCG (5000 UT) CAPS Take 1 capsule by mouth daily.    [provider]  clindamycin (CLEOCIN T) 1 % lotion Apply to cleansed face daily in the morning. 06/30/21     clindamycin (CLEOCIN) 2 % vaginal cream Place 1 Applicatorful vaginally at bedtime. 10/09/21   Donnamae Jude, MD  cyclobenzaprine (FLEXERIL) 5 MG tablet Take 1 tablet (5 mg total) by mouth at bedtime as needed for muscle spasms. 11/21/21   Camillia Herter, NP  famotidine (PEPCID) 20 MG tablet Take 1 tablet (20 mg total) by mouth 2 (two) times daily. 09/27/21   Carlisle Cater, PA-C  Ferrous Sulfate (IRON) 28 MG TABS Take 1 tablet by mouth daily.    [provider]  fluticasone (FLONASE) 50 MCG/ACT nasal spray Place 2 sprays into both nostrils daily. 11/26/20   Camillia Herter, NP   fluticasone (FLONASE) 50 MCG/ACT nasal spray Place 2 sprays into both nostrils daily. 12/09/21   Melynda Ripple, MD  ondansetron (ZOFRAN-ODT) 4 MG disintegrating tablet Dissolve 1 tablet (4 mg total) by mouth every 8 (eight) hours as needed for nausea or vomiting. 12/08/21   Willia Craze, NP  pantoprazole (PROTONIX) 40 MG tablet Take 1 tablet (40 mg total) by mouth daily. 11/17/21   Willia Craze, NP  polyethylene glycol powder (GLYCOLAX/MIRALAX) 17 GM/SCOOP powder Take 17 g by mouth daily as needed for mild constipation. 09/27/21   Carlisle Cater, PA-C  Prucalopride Succinate (MOTEGRITY) 2 MG TABS Take 1 tablet (2 mg total) by mouth daily. Patient not taking: Reported on 12/04/2021 09/18/21   Willia Craze, NP  sucralfate (CARAFATE) 1 g tablet Take 1 tablet (1 g total) by mouth 4 (four) times daily -  with meals and at bedtime. 12/08/21   Willia Craze, NP  SUMAtriptan (IMITREX) 25 MG tablet Take 25 mg (1 tablet total) by mouth at the start of the headache. May repeat in 2 hours x 1 if headache persists. Max of 2 tablets/24 hours. 12/01/21   Camillia Herter, NP  tretinoin (RETIN-A) 0.025 % cream Apply pea size amount to cleansed face every other day at bedtime. Increase to every night as tolerated. 06/30/21     triamcinolone cream (KENALOG) 0.1 % Apply to affected areas twice daily for 3 days on and 3 days off cycle or as needed. 06/30/21       Family History Family History  Problem Relation Age of Onset   Hypertension Mother    Hypertension Father    Breast cancer Maternal Grandmother        5   Colon cancer Neg Hx    Colon polyps Neg Hx    Kidney disease Neg Hx    Diabetes Neg Hx    Esophageal cancer Neg Hx    Gallbladder disease Neg Hx    Heart disease Neg Hx    Asthma Neg Hx    Stroke Neg Hx    Stomach cancer Neg Hx     Social History Social History   Tobacco Use   Smoking status: Never   Smokeless tobacco: Never  Vaping Use   Vaping Use: Never used  Substance  Use Topics   Alcohol use: No   Drug use: No     Allergies   Macrobid [nitrofurantoin], Augmentin [amoxicillin-pot clavulanate], and Metformin and related   Review of Systems Review of Systems As per HPI  Physical Exam Triage Vital Signs ED Triage Vitals [  12/22/21 0841]  Enc Vitals Group     BP 138/86     Pulse Rate 72     Resp 16     Temp 98.6 F (37 C)     Temp Source Oral     SpO2 98 %     Weight      Height      Head Circumference      Peak Flow      Pain Score 9     Pain Loc      Pain Edu?      Excl. in [place]?    No data found.  Updated Vital Signs BP 138/86 (BP Location: Left Arm)   Pulse 72   Temp 98.6 F (37 C) (Oral)   Resp 16   SpO2 98%   Visual Acuity Right Eye Distance:   Left Eye Distance:   Bilateral Distance:    Right Eye Near:   Left Eye Near:    Bilateral Near:     Physical Exam Vitals and nursing note reviewed.  Constitutional:      General: She is not in acute distress.    Appearance: She is not ill-appearing.  Cardiovascular:     Rate and Rhythm: Normal rate and regular rhythm.     Pulses: Normal pulses.     Heart sounds: Normal heart sounds.  Pulmonary:     Effort: Pulmonary effort is normal.     Breath sounds: Normal breath sounds.  Abdominal:     General: Bowel sounds are normal.     Palpations: Abdomen is soft.  Musculoskeletal:     Comments: Positive Phalen's test  Neurological:     Mental Status: She is alert.      UC Treatments / Results  Labs (all labs ordered are listed, but only abnormal results are displayed) Labs Reviewed  POCT URINALYSIS DIP (MANUAL ENTRY)  CERVICOVAGINAL ANCILLARY ONLY    EKG   Radiology No results found.  Procedures Procedures (including critical care time)  Medications Ordered in UC Medications  dexamethasone (DECADRON) injection 10 mg (10 mg Intramuscular Given 12/22/21 0916)    Initial Impression / Assessment and Plan / UC Course  I have reviewed the triage vital signs  and the nursing notes.  Pertinent labs & imaging results that were available during my care of the patient were reviewed by me and considered in my medical decision making (see chart for details).    1.  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  Acute vaginitis: Cervicovaginal swab for GC/chlamydia/trichomonas/bacterial vaginosis/vaginal yeast HIV/RPR Abstinence recommended Return precautions given  3.  Allergic sinusitis: Humidifier/VapoRub use Patient recently completed a course of antibiotics Dexamethasone 10 mg IM x1 dose Prednisone was offered but patient prefers the IM steroids. Return precautions given. Final Clinical Impressions(s) / UC Diagnoses   Final diagnoses:  Right carpal tunnel syndrome  Acute vaginitis  Allergic sinusitis     Discharge Instructions      Humidifier and VapoRub use recommended Please wear your wrist splint at home and at bedtime Icing of your right wrist after you return from work Gentle wrist exercises Please take medications as prescribed Please abstain from sexual intercourse until lab results are available We will call you with recommendations if labs are abnormal Return to urgent care if symptoms worsen.  ED Prescriptions     Medication Sig Dispense Auth. Provider   meloxicam (MOBIC) 7.5 MG tablet Take 1 tablet (7.5 mg total) by mouth daily. 20 tablet Kenyon Eichelberger, Myrene Galas, MD   fluconazole (DIFLUCAN) 150 MG tablet Take 1 tablet (150 mg total) by mouth once for 1 dose. These take second dose 3 days after the first 1 if symptoms are persistent 2 tablet Amauris Debois, Myrene Galas, MD      PDMP not reviewed this encounter.   Chase Picket, MD 12/22/21 1006

## 2021-12-22 NOTE — Discharge Instructions (Addendum)
Humidifier and VapoRub use recommended Please wear your wrist splint at home and at bedtime Icing of your right wrist after you return from work Gentle wrist exercises Please take medications as prescribed Please abstain from sexual intercourse until lab results are available We will call you with recommendations if labs are abnormal Return to urgent care if symptoms worsen.

## 2021-12-23 LAB — CERVICOVAGINAL ANCILLARY ONLY
Bacterial Vaginitis (gardnerella): NEGATIVE
Candida Glabrata: NEGATIVE
Candida Vaginitis: POSITIVE — AB
Chlamydia: NEGATIVE
Comment: NEGATIVE
Comment: NEGATIVE
Comment: NEGATIVE
Comment: NEGATIVE
Comment: NEGATIVE
Comment: NORMAL
Neisseria Gonorrhea: NEGATIVE
Trichomonas: NEGATIVE

## 2021-12-23 LAB — RPR: RPR Ser Ql: NONREACTIVE

## 2021-12-23 LAB — HIV ANTIBODY (ROUTINE TESTING W REFLEX): HIV Screen 4th Generation wRfx: NONREACTIVE

## 2021-12-24 ENCOUNTER — Encounter (HOSPITAL_COMMUNITY): Payer: Self-pay | Admitting: Pharmacist

## 2021-12-24 ENCOUNTER — Other Ambulatory Visit (HOSPITAL_COMMUNITY): Payer: Self-pay

## 2021-12-25 ENCOUNTER — Encounter: Payer: Self-pay | Admitting: Certified Registered Nurse Anesthetist

## 2021-12-26 ENCOUNTER — Other Ambulatory Visit (HOSPITAL_COMMUNITY): Payer: Self-pay

## 2021-12-29 ENCOUNTER — Ambulatory Visit (HOSPITAL_COMMUNITY): Payer: Self-pay | Admitting: Clinical

## 2021-12-31 ENCOUNTER — Ambulatory Visit
Admission: EM | Admit: 2021-12-31 | Discharge: 2021-12-31 | Disposition: A | Discharge status: Home / Self Care | Payer: Medicaid Other | Attending: Urgent Care | Admitting: Urgent Care

## 2021-12-31 ENCOUNTER — Encounter: Payer: Self-pay | Admitting: Emergency Medicine

## 2021-12-31 DIAGNOSIS — J329 Chronic sinusitis, unspecified: Secondary | ICD-10-CM

## 2021-12-31 DIAGNOSIS — J309 Allergic rhinitis, unspecified: Secondary | ICD-10-CM

## 2021-12-31 MED ORDER — PREDNISONE 50 MG PO TABS: 50.0000 mg | ORAL_TABLET | Freq: Every day | ORAL | 0 refills | Status: DC

## 2021-12-31 MED ORDER — CEFDINIR 300 MG PO CAPS: 300.0000 mg | ORAL_CAPSULE | Freq: Two times a day (BID) | ORAL | 0 refills | Status: DC

## 2021-12-31 MED ORDER — ONDANSETRON 8 MG PO TBDP: 8.0000 mg | ORAL_TABLET | Freq: Three times a day (TID) | ORAL | 0 refills | Status: DC | PRN

## 2021-12-31 NOTE — ED Triage Notes (Signed)
Reports cough, sneezing, facial pain x 2 weeks. Denies fever.

## 2021-12-31 NOTE — ED Provider Notes (Signed)
[name]  Note:  This document was prepared using Systems analyst and may include unintentional dictation errors.  MRN: [id number] DOB: [birth year]  Subjective:   Sherri [name] is a 36 y.o. female presenting for recheck on 2 to 3-week history of persistent sinus congestion, sinus drainage, bilateral facial pressure and pain, bilateral ear fullness.  Has also been sneezing and coughing (to a lesser degree).  No chest pain, shortness of breath or wheezing.  No history of asthma.  Patient has diabetes treated without insulin.  She was seen 12/09/2021, underwent a course of doxycycline, received IM dexamethasone in clinic.  She did not notice much change in her symptoms.  Reports that she has a difficult time tolerating medications orally but also states that she prefers injections in clinic so that she does not have to pay for prescriptions as she has financial restraints.  Has been using Zyrtec daily, Flonase as well.  Has not seen an allergist or an ENT specialist despite having recurrent sinus infections.  No current facility-administered medications for this encounter.  Current Outpatient Medications:    amitriptyline (ELAVIL) 25 MG tablet, Take 2 tablets (50 mg total) by mouth at bedtime., Disp: 60 tablet, Rfl: 2   cetirizine (ZYRTEC ALLERGY) 10 MG tablet, Take 1 tablet (10 mg total) by mouth daily., Disp: 30 tablet, Rfl: 11   Cholecalciferol (VITAMIN D3) 125 MCG (5000 UT) CAPS, Take 1 capsule by mouth daily., Disp: , Rfl:    clindamycin (CLEOCIN T) 1 % lotion, Apply to cleansed face daily in the morning., Disp: 60 mL, Rfl: 1   clindamycin (CLEOCIN) 2 % vaginal cream, Place 1 Applicatorful vaginally at bedtime., Disp: 40 g, Rfl: 0   cyclobenzaprine (FLEXERIL) 5 MG tablet, Take 1 tablet (5 mg total) by mouth at bedtime as needed for muscle spasms., Disp: 30 tablet, Rfl: 2   famotidine (PEPCID) 20 MG tablet, Take 1 tablet (20 mg total) by mouth 2  (two) times daily., Disp: 30 tablet, Rfl: 0   Ferrous Sulfate (IRON) 28 MG TABS, Take 1 tablet by mouth daily., Disp: , Rfl:    fluticasone (FLONASE) 50 MCG/ACT nasal spray, Place 2 sprays into both nostrils daily., Disp: 16 g, Rfl: 0   fluticasone (FLONASE) 50 MCG/ACT nasal spray, Place 2 sprays into both nostrils daily., Disp: 16 g, Rfl: 0   meloxicam (MOBIC) 7.5 MG tablet, Take 1 tablet (7.5 mg total) by mouth daily., Disp: 20 tablet, Rfl: 0   ondansetron (ZOFRAN-ODT) 4 MG disintegrating tablet, Dissolve 1 tablet (4 mg total) by mouth every 8 (eight) hours as needed for nausea or vomiting., Disp: 40 tablet, Rfl: 3   pantoprazole (PROTONIX) 40 MG tablet, Take 1 tablet (40 mg total) by mouth daily., Disp: 30 tablet, Rfl: 6   polyethylene glycol powder (GLYCOLAX/MIRALAX) 17 GM/SCOOP powder, Take 17 g by mouth daily as needed for mild constipation., Disp: 238 g, Rfl: 0   Prucalopride Succinate (MOTEGRITY) 2 MG TABS, Take 1 tablet (2 mg total) by mouth daily. (Patient not taking: Reported on 12/04/2021), Disp: 30 tablet, Rfl: 3   sucralfate (CARAFATE) 1 g tablet, Take 1 tablet (1 g total) by mouth 4 (four) times daily -  with meals and at bedtime., Disp: 60 tablet, Rfl: 0   SUMAtriptan (IMITREX) 25 MG tablet, Take 25 mg (1 tablet total) by mouth at the start of the headache. May repeat in 2 hours x 1 if headache persists. Max of 2 tablets/24 hours., Disp:  30 tablet, Rfl: 1   tretinoin (RETIN-A) 0.025 % cream, Apply pea size amount to cleansed face every other day at bedtime. Increase to every night as tolerated., Disp: 20 g, Rfl: 0   triamcinolone cream (KENALOG) 0.1 %, Apply to affected areas twice daily for 3 days on and 3 days off cycle or as needed., Disp: 80 g, Rfl: 0   Allergies  Allergen Reactions   Macrobid [Nitrofurantoin] Nausea And Vomiting   Augmentin [Amoxicillin-Pot Clavulanate]     Pt states it makes her sick   Metformin And Related Nausea And Vomiting    Past Medical History:   Diagnosis Date   Diabetes mellitus without complication (HCC)    Fibromyalgia    denies today   Genital herpes    Hypertension    gestational   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    Urinary tract infection    Uterine fibroid      Past Surgical History:  Procedure Laterality Date   CESAREAN SECTION N/A 06/05/2014   Procedure: CESAREAN SECTION;  Surgeon: Truett Mainland, DO;  Location: [facility];  Service: Obstetrics;  Laterality: N/A;   WISDOM TOOTH EXTRACTION      Family History  Problem Relation Age of Onset   Hypertension Mother    Hypertension Father    Breast cancer Maternal Grandmother        68   Colon cancer Neg Hx    Colon polyps Neg Hx    Kidney disease Neg Hx    Diabetes Neg Hx    Esophageal cancer Neg Hx    Gallbladder disease Neg Hx    Heart disease Neg Hx    Asthma Neg Hx    Stroke Neg Hx    Stomach cancer Neg Hx     Social History   Tobacco Use   Smoking status: Never   Smokeless tobacco: Never  Vaping Use   Vaping Use: Never used  Substance Use Topics   Alcohol use: No   Drug use: No    ROS   Objective:   Vitals: BP (!) 144/82 (BP Location: Right Arm)   Pulse 74   Temp 98.2 F (36.8 C) (Oral)   Resp 16   SpO2 98%   Physical Exam Constitutional:      General: She is not in acute distress.    Appearance: Normal appearance. She is well-developed and normal weight. She is not ill-appearing, toxic-appearing or diaphoretic.  HENT:     Head: Normocephalic and atraumatic.     Right Ear: Tympanic membrane, ear canal and external ear normal. No drainage or tenderness. No middle ear effusion. There is no impacted cerumen. Tympanic membrane is not erythematous or bulging.     Left Ear: Tympanic membrane, ear canal and external ear normal. No drainage or tenderness.  No middle ear effusion. There is no impacted cerumen. Tympanic membrane is not erythematous or bulging.     Nose: No congestion or rhinorrhea.      Mouth/Throat:     Mouth: Mucous membranes are moist. No oral lesions.     Pharynx: No pharyngeal swelling, oropharyngeal exudate, posterior oropharyngeal erythema or uvula swelling.     Tonsils: No tonsillar exudate or tonsillar abscesses.  Eyes:     General: No scleral icterus.       Right eye: No discharge.        Left eye: No discharge.     Extraocular Movements: Extraocular movements intact.     Right eye:  Normal extraocular motion.     Left eye: Normal extraocular motion.     Conjunctiva/sclera: Conjunctivae normal.  Cardiovascular:     Rate and Rhythm: Normal rate.  Pulmonary:     Effort: Pulmonary effort is normal.  Musculoskeletal:     Cervical back: Normal range of motion and neck supple.  Lymphadenopathy:     Cervical: No cervical adenopathy.  Skin:    General: Skin is warm and dry.  Neurological:     General: No focal deficit present.     Mental Status: She is alert and oriented to person, place, and time.  Psychiatric:        Mood and Affect: Mood normal.        Behavior: Behavior normal.        Thought Content: Thought content normal.        Judgment: Judgment normal.     Assessment and Plan :   PDMP not reviewed this encounter.  1. Recurrent sinus infections   2. Allergic rhinitis, unspecified seasonality, unspecified trigger     Patient has recurrent sinusitis and recommended cefdinir, prednisone.  Advised against another steroid injection as this made no difference for her.  Recommended she follow-up with an ENT specialist or an allergist. Counseled patient on potential for adverse effects with medications prescribed/recommended today, ER and return-to-clinic precautions discussed, patient verbalized understanding.    Jaynee Eagles, [place] 12/31/21 [phone]

## 2022-01-01 ENCOUNTER — Telehealth: Payer: Self-pay | Admitting: Gastroenterology

## 2022-01-01 ENCOUNTER — Encounter: Payer: Medicaid Other | Admitting: Gastroenterology

## 2022-01-01 NOTE — Telephone Encounter (Signed)
Patient called stated she had to work today,requested to reschedule her procedure is now on for 02/05/22.

## 2022-01-02 ENCOUNTER — Ambulatory Visit: Payer: Medicaid Other | Admitting: Physician Assistant

## 2022-01-02 ENCOUNTER — Ambulatory Visit: Payer: Self-pay | Admitting: Family

## 2022-01-07 ENCOUNTER — Other Ambulatory Visit: Payer: Self-pay | Admitting: Family

## 2022-01-07 DIAGNOSIS — J3089 Other allergic rhinitis: Secondary | ICD-10-CM

## 2022-01-08 ENCOUNTER — Encounter (HOSPITAL_COMMUNITY): Payer: Self-pay

## 2022-01-08 ENCOUNTER — Other Ambulatory Visit (HOSPITAL_COMMUNITY): Payer: Self-pay

## 2022-01-08 MED ORDER — CETIRIZINE HCL 10 MG PO TABS
10.0000 mg | ORAL_TABLET | Freq: Every day | ORAL | 0 refills | Status: DC
  Filled 2022-01-08: qty 30, 30d supply, fill #0

## 2022-01-14 ENCOUNTER — Other Ambulatory Visit (HOSPITAL_COMMUNITY): Payer: Self-pay

## 2022-01-15 ENCOUNTER — Other Ambulatory Visit (HOSPITAL_COMMUNITY): Payer: Self-pay

## 2022-01-15 ENCOUNTER — Ambulatory Visit
Admission: EM | Admit: 2022-01-15 | Discharge: 2022-01-15 | Disposition: A | Discharge status: Home / Self Care | Payer: Medicaid Other | Attending: Urgent Care | Admitting: Urgent Care

## 2022-01-15 DIAGNOSIS — L299 Pruritus, unspecified: Secondary | ICD-10-CM

## 2022-01-15 DIAGNOSIS — H0259 Other disorders affecting eyelid function: Secondary | ICD-10-CM

## 2022-01-15 MED ORDER — HYDROXYZINE HCL 25 MG PO TABS: 12.5000 mg | ORAL_TABLET | Freq: Three times a day (TID) | ORAL | 0 refills | Status: DC | PRN

## 2022-01-15 MED ORDER — METHYLPREDNISOLONE SODIUM SUCC 125 MG IJ SOLR
125.0000 mg | Freq: Once | INTRAMUSCULAR | Status: AC
  Administered 2022-01-15: 125 mg via INTRAMUSCULAR

## 2022-01-15 NOTE — ED Provider Notes (Signed)
[name]  Note:  This document was prepared using Systems analyst and may include unintentional dictation errors.  MRN: [id number] DOB: 09-25-1985  Subjective:   Sherri [name] is a 36 y.o. female presenting for 1 week history of persistent itching all over.  She is also felt some puffiness of her eyelids.  Cannot really recall if she changed any hygiene products or exposures.  She plans on switching to products for gentle skin which she does not have now.  Of note, she did undergo a course of cefdinir starting earlier this month.  It was coupled with an oral prednisone course.  Patient states that she recently completed the course.  Did not notice any particular hives.  No difficulty with her breathing, oral swelling.  No chance of pregnancy per patient.  No current facility-administered medications for this encounter.  Current Outpatient Medications:    amitriptyline (ELAVIL) 25 MG tablet, Take 2 tablets (50 mg total) by mouth at bedtime., Disp: 60 tablet, Rfl: 2   cefdinir (OMNICEF) 300 MG capsule, Take 1 capsule (300 mg total) by mouth 2 (two) times daily. (Patient not taking: Reported on 01/15/2022), Disp: 20 capsule, Rfl: 0   cetirizine (ZYRTEC ALLERGY) 10 MG tablet, Take 1 tablet (10 mg total) by mouth daily., Disp: 90 tablet, Rfl: 0   Cholecalciferol (VITAMIN D3) 125 MCG (5000 UT) CAPS, Take 1 capsule by mouth daily., Disp: , Rfl:    clindamycin (CLEOCIN T) 1 % lotion, Apply to cleansed face daily in the morning., Disp: 60 mL, Rfl: 1   clindamycin (CLEOCIN) 2 % vaginal cream, Place 1 Applicatorful vaginally at bedtime., Disp: 40 g, Rfl: 0   cyclobenzaprine (FLEXERIL) 5 MG tablet, Take 1 tablet (5 mg total) by mouth at bedtime as needed for muscle spasms., Disp: 30 tablet, Rfl: 2   famotidine (PEPCID) 20 MG tablet, Take 1 tablet (20 mg total) by mouth 2 (two) times daily., Disp: 30 tablet, Rfl: 0   Ferrous Sulfate (IRON) 28 MG TABS, Take 1  tablet by mouth daily., Disp: , Rfl:    fluticasone (FLONASE) 50 MCG/ACT nasal spray, Place 2 sprays into both nostrils daily., Disp: 16 g, Rfl: 0   fluticasone (FLONASE) 50 MCG/ACT nasal spray, Place 2 sprays into both nostrils daily., Disp: 16 g, Rfl: 0   meloxicam (MOBIC) 7.5 MG tablet, Take 1 tablet (7.5 mg total) by mouth daily., Disp: 20 tablet, Rfl: 0   ondansetron (ZOFRAN-ODT) 8 MG disintegrating tablet, Take 1 tablet (8 mg total) by mouth every 8 (eight) hours as needed for nausea or vomiting., Disp: 20 tablet, Rfl: 0   pantoprazole (PROTONIX) 40 MG tablet, Take 1 tablet (40 mg total) by mouth daily., Disp: 30 tablet, Rfl: 6   polyethylene glycol powder (GLYCOLAX/MIRALAX) 17 GM/SCOOP powder, Take 17 g by mouth daily as needed for mild constipation., Disp: 238 g, Rfl: 0   predniSONE (DELTASONE) 50 MG tablet, Take 1 tablet (50 mg total) by mouth daily with breakfast. (Patient not taking: Reported on 01/15/2022), Disp: 5 tablet, Rfl: 0   Prucalopride Succinate (MOTEGRITY) 2 MG TABS, Take 1 tablet (2 mg total) by mouth daily. (Patient not taking: Reported on 12/04/2021), Disp: 30 tablet, Rfl: 3   sucralfate (CARAFATE) 1 g tablet, Take 1 tablet (1 g total) by mouth 4 (four) times daily -  with meals and at bedtime., Disp: 60 tablet, Rfl: 0   SUMAtriptan (IMITREX) 25 MG tablet, Take 25 mg (1 tablet total) by  mouth at the start of the headache. May repeat in 2 hours x 1 if headache persists. Max of 2 tablets/24 hours., Disp: 30 tablet, Rfl: 1   tretinoin (RETIN-A) 0.025 % cream, Apply pea size amount to cleansed face every other day at bedtime. Increase to every night as tolerated., Disp: 20 g, Rfl: 0   triamcinolone cream (KENALOG) 0.1 %, Apply to affected areas twice daily for 3 days on and 3 days off cycle or as needed., Disp: 80 g, Rfl: 0   Allergies  Allergen Reactions   Macrobid [Nitrofurantoin] Nausea And Vomiting   Augmentin [Amoxicillin-Pot Clavulanate]     Pt states it makes her sick    Metformin And Related Nausea And Vomiting    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    Urinary tract infection    Uterine fibroid      Past Surgical History:  Procedure Laterality Date   CESAREAN SECTION N/A 06/05/2014   Procedure: CESAREAN SECTION;  Surgeon: Truett Mainland, DO;  Location: [facility];  Service: Obstetrics;  Laterality: N/A;   WISDOM TOOTH EXTRACTION      Family History  Problem Relation Age of Onset   Hypertension Mother    Hypertension Father    Breast cancer Maternal Grandmother        61   Colon cancer Neg Hx    Colon polyps Neg Hx    Kidney disease Neg Hx    Diabetes Neg Hx    Esophageal cancer Neg Hx    Gallbladder disease Neg Hx    Heart disease Neg Hx    Asthma Neg Hx    Stroke Neg Hx    Stomach cancer Neg Hx     Social History   Tobacco Use   Smoking status: Never   Smokeless tobacco: Never  Vaping Use   Vaping Use: Never used  Substance Use Topics   Alcohol use: No   Drug use: No    ROS   Objective:   Vitals: BP 129/82 (BP Location: Right Arm)   Pulse 78   Temp 99 F (37.2 C) (Oral)   Resp 16   SpO2 96%   Physical Exam Constitutional:      General: She is not in acute distress.    Appearance: Normal appearance. She is well-developed and normal weight. She is not ill-appearing, toxic-appearing or diaphoretic.  HENT:     Head: Normocephalic and atraumatic.     Right Ear: Tympanic membrane, ear canal and external ear normal. No drainage or tenderness. No middle ear effusion. There is no impacted cerumen. Tympanic membrane is not erythematous or bulging.     Left Ear: Tympanic membrane, ear canal and external ear normal. No drainage or tenderness.  No middle ear effusion. There is no impacted cerumen. Tympanic membrane is not erythematous or  bulging.     Nose: Nose normal. No congestion or rhinorrhea.     Mouth/Throat:     Mouth: Mucous membranes are moist. No oral lesions.     Pharynx: No pharyngeal swelling, oropharyngeal exudate, posterior oropharyngeal erythema or uvula swelling.     Tonsils: No tonsillar exudate or tonsillar abscesses.     Comments: Airway is patent.  No oral swelling. Eyes:     General: No scleral icterus.  Right eye: No discharge.        Left eye: No discharge.     Extraocular Movements: Extraocular movements intact.     Right eye: Normal extraocular motion.     Left eye: Normal extraocular motion.     Conjunctiva/sclera: Conjunctivae normal.     Comments: Trace swelling of the upper eyelids bilaterally.  Cardiovascular:     Rate and Rhythm: Normal rate and regular rhythm.     Heart sounds: Normal heart sounds. No murmur heard.    No friction rub. No gallop.  Pulmonary:     Effort: Pulmonary effort is normal. No respiratory distress.     Breath sounds: No stridor. No wheezing, rhonchi or rales.  Chest:     Chest wall: No tenderness.  Musculoskeletal:     Cervical back: Normal range of motion and neck supple.  Lymphadenopathy:     Cervical: No cervical adenopathy.  Skin:    General: Skin is warm and dry.     Findings: No rash.  Neurological:     General: No focal deficit present.     Mental Status: She is alert and oriented to person, place, and time.  Psychiatric:        Mood and Affect: Mood normal.        Behavior: Behavior normal.    IM Solu-Medrol in clinic at 125 mg.  Assessment and Plan :   PDMP not reviewed this encounter.  1. Itching   2. Superficial swelling of eyelid     Suspect itching is related to the recent use of cefdinir.  Recommended avoiding the for the use of oral prednisone.  Patient prefers this as well she likes to get in clinic treatments.  Therefore I used IM Solu-Medrol.  Use hydroxyzine as needed as an outpatient.  No signs of anaphylaxis.  Cefdinir  added to her reaction list.  Counseled patient on potential for adverse effects with medications prescribed/recommended today, ER and return-to-clinic precautions discussed, patient verbalized understanding.    Jaynee Eagles, PA-C 01/15/22 [id number]

## 2022-01-15 NOTE — ED Triage Notes (Signed)
Reports itching, puffy eyes, and pressure x 1 week

## 2022-01-19 ENCOUNTER — Other Ambulatory Visit (HOSPITAL_COMMUNITY): Payer: Self-pay

## 2022-01-19 ENCOUNTER — Ambulatory Visit (HOSPITAL_COMMUNITY): Payer: Self-pay | Admitting: Clinical

## 2022-01-27 ENCOUNTER — Ambulatory Visit
Admission: EM | Admit: 2022-01-27 | Discharge: 2022-01-27 | Disposition: A | Discharge status: Home / Self Care | Payer: Medicaid Other | Attending: Emergency Medicine | Admitting: Emergency Medicine

## 2022-01-27 DIAGNOSIS — J309 Allergic rhinitis, unspecified: Secondary | ICD-10-CM

## 2022-01-27 DIAGNOSIS — R058 Other specified cough: Secondary | ICD-10-CM

## 2022-01-27 MED ORDER — FLUTICASONE PROPIONATE 50 MCG/ACT NA SUSP: 1.0000 | Freq: Every day | NASAL | 2 refills | Status: DC

## 2022-01-27 MED ORDER — NAPROXEN SODIUM 220 MG PO TABS: 220.0000 mg | ORAL_TABLET | Freq: Every day | ORAL | 0 refills | Status: DC | PRN

## 2022-01-27 MED ORDER — IPRATROPIUM BROMIDE 0.06 % NA SOLN: 2.0000 | Freq: Three times a day (TID) | NASAL | 2 refills | Status: DC

## 2022-01-27 MED ORDER — MONTELUKAST SODIUM 10 MG PO TABS: 10.0000 mg | ORAL_TABLET | Freq: Every day | ORAL | 0 refills | Status: DC

## 2022-01-27 MED ORDER — METHYLPREDNISOLONE SODIUM SUCC 125 MG IJ SOLR
80.0000 mg | Freq: Once | INTRAMUSCULAR | Status: AC
  Administered 2022-01-27: 80 mg via INTRAMUSCULAR

## 2022-01-27 MED ORDER — FAMOTIDINE 40 MG PO TABS: 40.0000 mg | ORAL_TABLET | Freq: Every day | ORAL | 0 refills | Status: DC

## 2022-01-27 MED ORDER — CETIRIZINE HCL 10 MG PO TABS: 10.0000 mg | ORAL_TABLET | Freq: Every day | ORAL | 0 refills | Status: DC

## 2022-01-27 NOTE — ED Triage Notes (Signed)
Pt states cough and congestion for the past week.  Has been taking allergy medicine at home.

## 2022-01-27 NOTE — ED Provider Notes (Signed)
[facility]    CSN: [id number] Arrival date & time: 01/27/22  8144    HISTORY   Chief Complaint  Patient presents with   Cough   HPI Sherri [name] is a pleasant, 36 y.o. female who presents to urgent care today. Patient reports continued allergies which have caused her to have a persistent, nonproductive cough over the past week.  Patient states she been taking Zyrtec and Flonase daily as prescribed.  Patient states she would like to see an allergy specialist but unfortunately has Medicaid family-planning which does not pay for this.  Patient denies fever, body aches, chills, nausea, vomiting, diarrhea, known sick contacts.  The history is provided by the patient.  Cough Relieved by: Oral steroids. Ineffective treatments: Tylenol. 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    Urinary tract infection    Uterine fibroid    Patient Active Problem List   Diagnosis Date Noted   Pre-diabetes 07/23/2021   Anxiety and depression 06/24/2021   Fibromyalgia 06/24/2021   Prediabetes 07/05/2020   Gastroesophageal reflux disease 04/26/2020   Perianal rash 03/26/2020   Chronic nausea 03/26/2020   Chronic constipation 03/09/2019   Pelvic pain in female 03/09/2019   Hypertension    Genital herpes    Uterine fibroid 12/25/2013   Obesity 12/25/2013   Bacterial vaginitis 04/27/2012   Past Surgical History:  Procedure Laterality Date   CESAREAN SECTION N/A 06/05/2014   Procedure: CESAREAN SECTION;  Surgeon: Truett Mainland, DO;  Location: [facility];  Service: Obstetrics;  Laterality: N/A;   WISDOM TOOTH EXTRACTION     OB History     Gravida  1   Para  1   Term      Preterm  1   AB      Living  2       SAB      IAB      Ectopic      Multiple  1   Live Births  2          Home Medications    Prior to Admission medications   Medication Sig Start Date End Date Taking? Authorizing Provider  amitriptyline (ELAVIL) 25 MG tablet Take 2 tablets (50 mg total) by mouth at bedtime. 06/24/21 02/18/22  Mayers, Cari S, PA-C  cefdinir (OMNICEF) 300 MG capsule Take 1 capsule (300 mg total) by mouth 2 (two) times daily. Patient not taking: Reported on 01/15/2022 12/31/21   Jaynee Eagles, PA-C  cetirizine (ZYRTEC ALLERGY) 10 MG tablet Take 1 tablet (10 mg total) by mouth daily. 01/08/22   Camillia Herter, NP  Cholecalciferol (VITAMIN D3) 125 MCG (5000 UT) CAPS Take 1 capsule by mouth daily.    [provider]  clindamycin (CLEOCIN T) 1 % lotion Apply to cleansed face daily in the morning. 06/30/21     clindamycin (CLEOCIN) 2 % vaginal cream Place 1 Applicatorful vaginally at bedtime. 10/09/21   Donnamae Jude, MD  cyclobenzaprine (FLEXERIL) 5 MG tablet Take 1 tablet (5 mg total) by mouth at bedtime as needed for muscle spasms. 11/21/21   Camillia Herter, NP  famotidine (PEPCID) 20 MG tablet Take 1 tablet (20 mg total) by  mouth 2 (two) times daily. 09/27/21   Carlisle Cater, PA-C  Ferrous Sulfate (IRON) 28 MG TABS Take 1 tablet by mouth daily.    [provider]  fluticasone (FLONASE) 50 MCG/ACT nasal spray Place 2 sprays into both nostrils daily. 11/26/20   Camillia Herter, NP  fluticasone (FLONASE) 50 MCG/ACT nasal spray Place 2 sprays into both nostrils daily. 12/09/21   Melynda Ripple, MD  hydrOXYzine (ATARAX) 25 MG tablet Take 0.5-1 tablets (12.5-25 mg total) by mouth every 8 (eight) hours as needed for itching. 01/15/22   Jaynee Eagles, PA-C  meloxicam (MOBIC) 7.5 MG tablet Take 1 tablet (7.5 mg total) by mouth daily. 12/22/21   Lamptey, Myrene Galas, MD  ondansetron (ZOFRAN-ODT) 8 MG disintegrating tablet Take 1 tablet (8 mg total) by mouth every 8 (eight) hours as needed for nausea or  vomiting. 12/31/21   Jaynee Eagles, PA-C  pantoprazole (PROTONIX) 40 MG tablet Take 1 tablet (40 mg total) by mouth daily. 11/17/21   Willia Craze, NP  polyethylene glycol powder (GLYCOLAX/MIRALAX) 17 GM/SCOOP powder Take 17 g by mouth daily as needed for mild constipation. 09/27/21   Carlisle Cater, PA-C  predniSONE (DELTASONE) 50 MG tablet Take 1 tablet (50 mg total) by mouth daily with breakfast. Patient not taking: Reported on 01/15/2022 12/31/21   Jaynee Eagles, PA-C  Prucalopride Succinate (MOTEGRITY) 2 MG TABS Take 1 tablet (2 mg total) by mouth daily. Patient not taking: Reported on 12/04/2021 09/18/21   Willia Craze, NP  sucralfate (CARAFATE) 1 g tablet Take 1 tablet (1 g total) by mouth 4 (four) times daily -  with meals and at bedtime. 12/08/21   Willia Craze, NP  SUMAtriptan (IMITREX) 25 MG tablet Take 25 mg (1 tablet total) by mouth at the start of the headache. May repeat in 2 hours x 1 if headache persists. Max of 2 tablets/24 hours. 12/01/21   Camillia Herter, NP  tretinoin (RETIN-A) 0.025 % cream Apply pea size amount to cleansed face every other day at bedtime. Increase to every night as tolerated. 06/30/21     triamcinolone cream (KENALOG) 0.1 % Apply to affected areas twice daily for 3 days on and 3 days off cycle or as needed. 06/30/21       Family History Family History  Problem Relation Age of Onset   Hypertension Mother    Hypertension Father    Breast cancer Maternal Grandmother        54   Colon cancer Neg Hx    Colon polyps Neg Hx    Kidney disease Neg Hx    Diabetes Neg Hx    Esophageal cancer Neg Hx    Gallbladder disease Neg Hx    Heart disease Neg Hx    Asthma Neg Hx    Stroke Neg Hx    Stomach cancer Neg Hx    Social History Social History   Tobacco Use   Smoking status: Never   Smokeless tobacco: Never  Vaping Use   Vaping Use: Never used  Substance Use Topics   Alcohol use: No   Drug use: No   Allergies   Macrobid [nitrofurantoin],  Augmentin [amoxicillin-pot clavulanate], Cefdinir, and Metformin and related  Review of Systems Review of Systems  Respiratory:  Positive for cough.    Pertinent findings revealed after performing a 14 point review of systems has been noted in the history of present illness.  Physical Exam Triage Vital Signs ED Triage Vitals  Enc Vitals Group  BP 12/27/20 0827 (!) 147/82     Pulse Rate 12/27/20 0827 72     Resp 12/27/20 0827 18     Temp 12/27/20 0827 98.3 F (36.8 C)     Temp Source 12/27/20 0827 Oral     SpO2 12/27/20 0827 98 %     Weight --      Height --      Head Circumference --      Peak Flow --      Pain Score 12/27/20 0826 5     Pain Loc --      Pain Edu? --      Excl. in [place]? --   No data found.  Updated Vital Signs BP 127/82 (BP Location: Right Arm)   Pulse 95   Temp 98.8 F (37.1 C) (Oral)   Resp 16   LMP 12/27/2021 (Approximate)   SpO2 97%   Physical Exam Vitals and nursing note reviewed.  Constitutional:      General: She is not in acute distress.    Appearance: Normal appearance. She is not ill-appearing.  HENT:     Head: Normocephalic and atraumatic.     Salivary Glands: Right salivary gland is not diffusely enlarged or tender. Left salivary gland is not diffusely enlarged or tender.     Right Ear: Ear canal and external ear normal. No drainage. A middle ear effusion is present. There is no impacted cerumen. Tympanic membrane is bulging. Tympanic membrane is not injected or erythematous.     Left Ear: Ear canal and external ear normal. No drainage. A middle ear effusion is present. There is no impacted cerumen. Tympanic membrane is bulging. Tympanic membrane is not injected or erythematous.     Ears:     Comments: Bilateral EACs normal, both TMs bulging with clear fluid    Nose: Rhinorrhea present. No nasal deformity, septal deviation, signs of injury, nasal tenderness, mucosal edema or congestion. Rhinorrhea is clear.     Right Nostril: Occlusion  present. No foreign body, epistaxis or septal hematoma.     Left Nostril: Occlusion present. No foreign body, epistaxis or septal hematoma.     Right Turbinates: Enlarged, swollen and pale.     Left Turbinates: Enlarged, swollen and pale.     Right Sinus: No maxillary sinus tenderness or frontal sinus tenderness.     Left Sinus: No maxillary sinus tenderness or frontal sinus tenderness.     Mouth/Throat:     Lips: Pink. No lesions.     Mouth: Mucous membranes are moist. No oral lesions.     Pharynx: Oropharynx is clear. Uvula midline. No posterior oropharyngeal erythema or uvula swelling.     Tonsils: No tonsillar exudate. 0 on the right. 0 on the left.     Comments: Postnasal drip Eyes:     General: Lids are normal.        Right eye: No discharge.        Left eye: No discharge.     Extraocular Movements: Extraocular movements intact.     Conjunctiva/sclera: Conjunctivae normal.     Right eye: Right conjunctiva is not injected.     Left eye: Left conjunctiva is not injected.  Neck:     Trachea: Trachea and phonation normal.  Cardiovascular:     Rate and Rhythm: Normal rate and regular rhythm.     Pulses: Normal pulses.     Heart sounds: Normal heart sounds. No murmur heard.    No friction rub. No gallop.  Pulmonary:     Effort: Pulmonary effort is normal. No accessory muscle usage, prolonged expiration or respiratory distress.     Breath sounds: Normal breath sounds. No stridor, decreased air movement or transmitted upper airway sounds. No decreased breath sounds, wheezing, rhonchi or rales.  Chest:     Chest wall: No tenderness.  Musculoskeletal:        General: Normal range of motion.     Cervical back: Normal range of motion and neck supple. Normal range of motion.  Lymphadenopathy:     Cervical: No cervical adenopathy.  Skin:    General: Skin is warm and dry.     Findings: No erythema or rash.  Neurological:     General: No focal deficit present.     Mental Status: She  is alert and oriented to person, place, and time.  Psychiatric:        Mood and Affect: Mood normal.        Behavior: Behavior normal.     Visual Acuity Right Eye Distance:   Left Eye Distance:   Bilateral Distance:    Right Eye Near:   Left Eye Near:    Bilateral Near:     [facility] / Diagnostics / Procedures:   Clinical Course as of 01/27/22 0931  Tue Jan 27, 2022  0916 Temp: 98.8 F (37.1 C) [LM]  0916 Pulse Rate: 95 [LM]    Clinical Course User Index [LM] Lynden Oxford Scales, PA-C    Radiology No results found.  Procedures Procedures (including critical care time) EKG  Pending results:  Labs Reviewed - No data to display  Medications Ordered in UC: Medications  methylPREDNISolone sodium succinate (SOLU-MEDROL) 125 mg/2 mL injection 80 mg (80 mg Intramuscular Given 01/27/22 0925)    UC Diagnoses / Final Clinical Impressions(s)   I have reviewed the triage vital signs and the nursing notes.  Pertinent labs & imaging results that were available during my care of the patient were reviewed by me and considered in my medical decision making (see chart for details).    Final diagnoses:  Allergic rhinitis, unspecified seasonality, unspecified trigger  Allergic rhinitis with postnasal drip  Dry cough   Injection of Solu-Medrol provided during visit to rapidly relief respiratory inflammation, patient advised to continue naproxen once daily to keep inflammation under control.  Patient advised to continue Flonase and Zyrtec.  Patient advised to begin ipratropium nasal spray Singulair and famotidine.  Patient also advised that when she obtains health insurance she should strongly consider reaching out to primary care for referral to allergy and immunology.  Return precautions advised.  ED Prescriptions     Medication Sig Dispense Auth. Provider   cetirizine (ZYRTEC ALLERGY) 10 MG tablet Take 1 tablet (10 mg total) by mouth at bedtime. 90 tablet Lynden Oxford  Scales, PA-C   famotidine (PEPCID) 40 MG tablet Take 1 tablet (40 mg total) by mouth at bedtime. 90 tablet Lynden Oxford Scales, PA-C   fluticasone (FLONASE) 50 MCG/ACT nasal spray Place 1 spray into both nostrils daily. Begin by using 2 sprays in each nare daily for 3 to 5 days, then decrease to 1 spray in each nare daily. 15.8 mL Lynden Oxford Scales, PA-C   ipratropium (ATROVENT) 0.06 % nasal spray Place 2 sprays into both nostrils 3 (three) times daily. As needed for nasal congestion, runny nose 15 mL Lynden Oxford Scales, PA-C   montelukast (SINGULAIR) 10 MG tablet Take 1 tablet (10 mg total) by mouth at bedtime. [place]  tablet Lynden Oxford Scales, PA-C   naproxen sodium (ALEVE) 220 MG tablet Take 1 tablet (220 mg total) by mouth daily as needed. 90 tablet Lynden Oxford Scales, [place]      PDMP not reviewed this encounter.  Disposition Upon Discharge:  Condition: stable for discharge home Home: take medications as prescribed; routine discharge instructions as discussed; follow up as advised.  Patient presented with an acute illness with associated systemic symptoms and significant discomfort requiring urgent management. In my opinion, this is a condition that a prudent lay person (someone who possesses an average knowledge of health and medicine) may potentially expect to result in complications if not addressed urgently such as respiratory distress, impairment of bodily function or dysfunction of bodily organs.   Routine symptom specific, illness specific and/or disease specific instructions were discussed with the patient and/or caregiver at length.   As such, the patient has been evaluated and assessed, work-up was performed and treatment was provided in alignment with urgent care protocols and evidence based medicine.  Patient/parent/caregiver has been advised that the patient may require follow up for further testing and treatment if the symptoms continue in spite of treatment, as  clinically indicated and appropriate.  If the patient was tested for COVID-19, Influenza and/or RSV, then the patient/parent/guardian was advised to isolate at home pending the results of his/her diagnostic coronavirus test and potentially longer if they're positive. I have also advised pt that if his/her COVID-19 test returns positive, it's recommended to self-isolate for at least 10 days after symptoms first appeared AND until fever-free for 24 hours without fever reducer AND other symptoms have improved or resolved. Discussed self-isolation recommendations as well as instructions for household member/close contacts as per the [facility] and [place] DHHS, and also gave patient the [place] packet with this information.  Patient/parent/caregiver has been advised to return to the [facility] or PCP in 3-5 days if no better; to PCP or the Emergency Department if new signs and symptoms develop, or if the current signs or symptoms continue to change or worsen for further workup, evaluation and treatment as clinically indicated and appropriate  The patient will follow up with their current PCP if and as advised. If the patient does not currently have a PCP we will assist them in obtaining one.   The patient may need specialty follow up if the symptoms continue, in spite of conservative treatment and management, for further workup, evaluation, consultation and treatment as clinically indicated and appropriate.  Patient/parent/caregiver verbalized understanding and agreement of plan as discussed.  All questions were addressed during visit.  Please see discharge instructions below for further details of plan.  Discharge Instructions:   Discharge Instructions      Please read below to learn more about the medications, dosages and frequencies that I recommend to help alleviate your symptoms and to get you feeling better soon:   Solu-Medrol IM (methylprednisolone):  To quickly address your significant respiratory inflammation,  you were provided with an injection of Solu-Medrol in the office today.  You should continue to feel the full benefit of the steroid for the next 4 to 6 hours.    Zyrtec (cetirizine): This is an excellent second-generation antihistamine that helps to reduce respiratory inflammatory response to environmental allergens.  In some patients, this medication can cause daytime sleepiness so I recommend that you take 1 tablet daily at bedtime.     Flonase (fluticasone): This is a steroid nasal spray that you use once daily, 1 spray in each nare.  This medication does not work well if you decide to use it only used as you feel you need to, it works best used on a daily basis.  After 3 to 5 days of use, you will notice significant reduction of the inflammation and mucus production that is currently being caused by exposure to allergens, whether seasonal or environmental.  The most common side effect of this medication is nosebleeds.  If you experience a nosebleed, please discontinue use for 1 week, then feel free to resume.  I have provided you with a prescription.     Atrovent (ipratropium): This is an excellent nasal decongestant spray I have added to your recommended nasal steroid that will not cause rebound congestion, please instill 2 sprays into each nare with each use.  Because nasal steroids can take several days before they begin to provide full benefit, I recommend that you use this spray in addition to the nasal steroid prescribed for you.  Please use it after you have used your nasal steroid and repeat up to 4 times daily as needed.  I have provided you with a prescription for this medication.      Advil, Motrin (ibuprofen): This is a good anti-inflammatory medication which not only addresses aches, pains but also significantly reduces soft tissue inflammation of the upper airways that causes sinus and nasal congestion as well as inflammation of the lower airways which makes you feel like your breathing is  constricted or your cough feel tight.  I recommend that you take 400 mg every 8 hours as needed.      If you find that your health insurance will not pay for allergy medications, please consider downloading the GoodRx app and using to get a better price than the "off the shelf" price.       Please follow-up within the next 7-10 days either with your primary care provider or urgent care if your symptoms do not resolve.  If you do not have a primary care provider, we will assist you in finding one.        Thank you for visiting urgent care today.  We appreciate the opportunity to participate in your care.         This office note has been dictated using Museum/gallery curator.  Unfortunately, this method of dictation can sometimes lead to typographical or grammatical errors.  I apologize for your inconvenience in advance if this occurs.  Please do not hesitate to reach out to me if clarification is needed.      Lynden Oxford Scales, PA-C 01/27/22 [phone]

## 2022-01-27 NOTE — Discharge Instructions (Signed)
Please read below to learn more about the medications, dosages and frequencies that I recommend to help alleviate your symptoms and to get you feeling better soon:   Solu-Medrol IM (methylprednisolone):  To quickly address your significant respiratory inflammation, you were provided with an injection of Solu-Medrol in the office today.  You should continue to feel the full benefit of the steroid for the next 4 to 6 hours.    Zyrtec (cetirizine): This is an excellent second-generation antihistamine that helps to reduce respiratory inflammatory response to environmental allergens.  In some patients, this medication can cause daytime sleepiness so I recommend that you take 1 tablet daily at bedtime.     Flonase (fluticasone): This is a steroid nasal spray that you use once daily, 1 spray in each nare.  This medication does not work well if you decide to use it only used as you feel you need to, it works best used on a daily basis.  After 3 to 5 days of use, you will notice significant reduction of the inflammation and mucus production that is currently being caused by exposure to allergens, whether seasonal or environmental.  The most common side effect of this medication is nosebleeds.  If you experience a nosebleed, please discontinue use for 1 week, then feel free to resume.  I have provided you with a prescription.     Atrovent (ipratropium): This is an excellent nasal decongestant spray I have added to your recommended nasal steroid that will not cause rebound congestion, please instill 2 sprays into each nare with each use.  Because nasal steroids can take several days before they begin to provide full benefit, I recommend that you use this spray in addition to the nasal steroid prescribed for you.  Please use it after you have used your nasal steroid and repeat up to 4 times daily as needed.  I have provided you with a prescription for this medication.      Advil, Motrin (ibuprofen): This is a good  anti-inflammatory medication which not only addresses aches, pains but also significantly reduces soft tissue inflammation of the upper airways that causes sinus and nasal congestion as well as inflammation of the lower airways which makes you feel like your breathing is constricted or your cough feel tight.  I recommend that you take 400 mg every 8 hours as needed.      If you find that your health insurance will not pay for allergy medications, please consider downloading the GoodRx app and using to get a better price than the "off the shelf" price.       Please follow-up within the next 7-10 days either with your primary care provider or urgent care if your symptoms do not resolve.  If you do not have a primary care provider, we will assist you in finding one.        Thank you for visiting urgent care today.  We appreciate the opportunity to participate in your care.

## 2022-01-29 ENCOUNTER — Other Ambulatory Visit (HOSPITAL_COMMUNITY): Payer: Self-pay

## 2022-01-29 ENCOUNTER — Encounter (HOSPITAL_COMMUNITY): Payer: Self-pay

## 2022-01-29 ENCOUNTER — Other Ambulatory Visit: Payer: Self-pay

## 2022-01-29 ENCOUNTER — Emergency Department (HOSPITAL_COMMUNITY)
Admission: EM | Admit: 2022-01-29 | Discharge: 2022-01-29 | Disposition: A | Discharge status: Home / Self Care | Payer: Self-pay | Attending: Emergency Medicine | Admitting: Emergency Medicine

## 2022-01-29 DIAGNOSIS — J069 Acute upper respiratory infection, unspecified: Secondary | ICD-10-CM

## 2022-01-29 DIAGNOSIS — Z20822 Contact with and (suspected) exposure to covid-19: Secondary | ICD-10-CM | POA: Insufficient documentation

## 2022-01-29 DIAGNOSIS — J101 Influenza due to other identified influenza virus with other respiratory manifestations: Secondary | ICD-10-CM | POA: Insufficient documentation

## 2022-01-29 LAB — RESP PANEL BY RT-PCR (FLU A&B, COVID) ARPGX2
Influenza A by PCR: POSITIVE — AB
Influenza B by PCR: NEGATIVE
SARS Coronavirus 2 by RT PCR: NEGATIVE

## 2022-01-29 MED ORDER — BENZONATATE 200 MG PO CAPS
200.0000 mg | ORAL_CAPSULE | Freq: Three times a day (TID) | ORAL | 0 refills | Status: DC
  Filled 2022-01-29 – 2022-02-02 (×2): qty 30, 10d supply, fill #0

## 2022-01-29 MED ORDER — LORATADINE 10 MG PO TABS
10.0000 mg | ORAL_TABLET | Freq: Every day | ORAL | 0 refills | Status: DC
  Filled 2022-01-29 – 2022-02-02 (×2): qty 30, 30d supply, fill #0

## 2022-01-29 MED ORDER — HYDROXYZINE HCL 25 MG PO TABS: 25.0000 mg | ORAL_TABLET | Freq: Three times a day (TID) | ORAL | 0 refills | Status: DC | PRN

## 2022-01-29 NOTE — ED Provider Notes (Signed)
[facility] DEPT Provider Note   CSN: [id number] Arrival date & time: 01/29/22  1015     History  Chief Complaint  Patient presents with   Sore Throat   Nasal Congestion   Cough    Sherri [name] is a 36 y.o. female.  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 Taking Tylenol and IBU with home remedies without any improvement.  No history of asthma or lung condition. Exposed to 2nd hand smoke, is a non smoker.  Seen by UC 2 days ago for same, provided with IM steroids, rx for naproxen, Atrovent nasal spray, Singulair, Flonase.        Home Medications Prior to Admission medications   Medication Sig Start Date End Date Taking? Authorizing Provider  benzonatate (TESSALON) 200 MG capsule Take 1 capsule (200 mg total) by mouth every 8 (eight) hours for 10 days. 01/29/22 02/08/22 Yes Tacy Learn, PA-C  loratadine (CLARITIN) 10 MG tablet Take 1 tablet (10 mg total) by mouth daily. 01/29/22 02/28/22 Yes Countryman, Cheri Rous, MD  amitriptyline (ELAVIL) 25 MG tablet Take 2 tablets (50 mg total) by mouth at bedtime. 06/24/21 02/18/22  Mayers, Cari S, PA-C  cetirizine (ZYRTEC ALLERGY) 10 MG tablet Take 1 tablet (10 mg total) by mouth at bedtime. 01/27/22 04/27/22  Lynden Oxford Scales, PA-C  Cholecalciferol (VITAMIN D3) 125 MCG (5000 UT) CAPS Take 1 capsule by mouth daily.    [provider]  clindamycin (CLEOCIN T) 1 % lotion Apply to cleansed face daily in the morning. 06/30/21     famotidine (PEPCID) 40 MG tablet Take 1 tablet (40 mg total) by mouth at bedtime. 01/27/22 04/27/22  Lynden Oxford Scales, PA-C  Ferrous Sulfate (IRON) 28 MG TABS Take 1 tablet by mouth daily.    [provider]  fluticasone (FLONASE) 50 MCG/ACT nasal spray Place 1 spray into both nostrils daily. Begin by using 2 sprays in each nare daily for 3 to 5 days, then decrease to  1 spray in each nare daily. 01/27/22   Lynden Oxford Scales, PA-C  hydrOXYzine (ATARAX) 25 MG tablet Take 1 tablet (25 mg total) by mouth every 8 (eight) hours as needed for itching. 01/29/22   Tretha Sciara, MD  ipratropium (ATROVENT) 0.06 % nasal spray Place 2 sprays into both nostrils 3 (three) times daily. As needed for nasal congestion, runny nose 01/27/22   Lynden Oxford Scales, PA-C  meloxicam (MOBIC) 7.5 MG tablet Take 1 tablet (7.5 mg total) by mouth daily. 12/22/21   Lamptey, Myrene Galas, MD  montelukast (SINGULAIR) 10 MG tablet Take 1 tablet (10 mg total) by mouth at bedtime. 01/27/22 04/27/22  Lynden Oxford Scales, PA-C  naproxen sodium (ALEVE) 220 MG tablet Take 1 tablet (220 mg total) by mouth daily as needed. 01/27/22 04/27/22  Lynden Oxford Scales, PA-C  pantoprazole (PROTONIX) 40 MG tablet Take 1 tablet (40 mg total) by mouth daily. 11/17/21   Willia Craze, NP  polyethylene glycol powder (GLYCOLAX/MIRALAX) 17 GM/SCOOP powder Take 17 g by mouth daily as needed for mild constipation. 09/27/21   Carlisle Cater, PA-C  SUMAtriptan (IMITREX) 25 MG tablet Take 25 mg (1 tablet total) by mouth at the start of the headache. May repeat in 2 hours x 1 if headache persists. Max of 2 tablets/24 hours. 12/01/21   Camillia Herter, NP  tretinoin (RETIN-A) 0.025 % cream Apply pea size amount to cleansed  face every other day at bedtime. Increase to every night as tolerated. 06/30/21     triamcinolone cream (KENALOG) 0.1 % Apply to affected areas twice daily for 3 days on and 3 days off cycle or as needed. 06/30/21         Allergies    Macrobid [nitrofurantoin], Augmentin [amoxicillin-pot clavulanate], Cefdinir, and Metformin and related    Review of Systems   Review of Systems Negative except as per HPI Physical Exam Updated Vital Signs BP (!) 132/95 (BP Location: Left Arm)   Pulse 91   Temp 98.2 F (36.8 C) (Oral)   Resp 18   Ht '4\' 11"'$  (1.499 m)   Wt 79.4 kg   LMP 12/27/2021  (Approximate)   SpO2 98%   BMI 35.35 kg/m  Physical Exam Vitals and nursing note reviewed.  Constitutional:      General: She is not in acute distress.    Appearance: She is well-developed. She is not diaphoretic.  HENT:     Head: Normocephalic and atraumatic.     Right Ear: Tympanic membrane and ear canal normal.     Left Ear: Tympanic membrane and ear canal normal.     Nose: Congestion present.     Right Sinus: No maxillary sinus tenderness or frontal sinus tenderness.     Left Sinus: No maxillary sinus tenderness or frontal sinus tenderness.     Mouth/Throat:     Mouth: Mucous membranes are moist. No oral lesions.     Pharynx: Uvula midline. Posterior oropharyngeal erythema present. No pharyngeal swelling, oropharyngeal exudate or uvula swelling.     Tonsils: No tonsillar exudate or tonsillar abscesses.  Eyes:     Conjunctiva/sclera: Conjunctivae normal.  Cardiovascular:     Rate and Rhythm: Normal rate and regular rhythm.     Heart sounds: Normal heart sounds.  Pulmonary:     Effort: Pulmonary effort is normal.     Breath sounds: Normal breath sounds.  Musculoskeletal:     Cervical back: Neck supple.  Lymphadenopathy:     Cervical: No cervical adenopathy.  Skin:    General: Skin is warm and dry.     Findings: No erythema or rash.  Neurological:     Mental Status: She is alert and oriented to person, place, and time.  Psychiatric:        Behavior: Behavior normal.     ED Results / Procedures / Treatments   Labs (all labs ordered are listed, but only abnormal results are displayed) Labs Reviewed  RESP PANEL BY RT-PCR (FLU A&B, COVID) ARPGX2 - Abnormal; Notable for the following components:      Result Value   Influenza A by PCR POSITIVE (*)    All other components within normal limits    EKG None  Radiology No results found.  Procedures Procedures    Medications Ordered in ED Medications - No data to display  ED Course/ Medical Decision Making/  A&P Clinical Course as of 01/29/22 1233  Thu Jan 29, 2022  1205 I was called to bedside to evaluate the patient.  She has had months of recurrent upper respiratory infections.  She was referred to ENT but has not had the ability to make an appointment yet.  She denies any fevers but endorses daily sinus drainage.  She was started on Flonase 2 days ago, has had multiple medications over the past 6 months.  Multiple rounds of antibiotics without symptomatic improvement.  Multiple rounds of steroids in the setting of underlying  diabetes also without symptomatic improvement.  [CC]    Clinical Course User Index [CC] Tretha Sciara, MD                           Medical Decision Making Risk Prescription drug management.   This patient presents to the ED for concern of cough, congestion, sore throat, this involves an extensive number of treatment options, and is a complaint that carries with it a high risk of complications and morbidity.  The differential diagnosis includes but not limited to viral sinusitis, URI, bronchitis   Co morbidities that complicate the patient evaluation  HTN, DM   Additional history obtained:  External records from outside source obtained and reviewed including recent visits to [facility] and ER including imaging studies. ER visit with CT head 11/30/21 for headache, normal, normal sinuses, provided with migraine cocktail including IV steroids UC visit 12/09/21, diagnosed with pansinusitis, treated with IM Decadron, Doxycycline UC visit 12/22/21, diagnosed with allergic sinusitis, treated with IM Decadron UC visit 12/31/21, diagnosed with recurrent sinusitis, treated with Cefdinir and PO prednisone UC visit 01/15/25, Concern for allergy to Cefdinir, treated with IM Solumedrol UC visit 01/01/22, Allergic Rhinitis, treated with IM Solumedrol  12/01/21, A1C 6.0 11/15/21 COVID negative    Lab Tests:  I Ordered, and personally interpreted labs.  The pertinent results  include:  COVID/flu, flu A +   Consultations Obtained:  Patient is dissatisfied with my plan of care. I requested consultation with the ER attending, Dr. Oswald Hillock,  and discussed lab and imaging findings as well as pertinent plan - they recommend: symptomatic treatment, continue with medications started in UC 2 days ago, follow up with ENT due to recurrent sinus complaints.    Problem List / ED Course / Critical interventions / Medication management  36 yo female with URI symptoms x 1 week, not improving after meds from UC 2 days ago (symptomatic). On exam, has boggy nasal turbinates and post nasal drip. Lungs CTA, vitals reassuring. Suspect viral illness, can send covid/flu as requested by patient. Discussed viral illness will need time to run its course, can continue with symptomatic treatment. Follow up with PCP as needed. Review of recent UC visits- concern for multiple steroid doses with history of DM. Last A1C 6.0 in early October.  Patient requests to speak with MD, discussed with attending who has seen the patient, recommends plan as above. COVID/flu results and is negative for COVID, + for flu A. Treatment plan unchanged. I have reviewed the patients home medicines and have made adjustments as needed   Social Determinants of Health:  No PCP   Test / Admission - Considered:  Consider CXR however with O2 sat 98% on room air, lungs CTA         Final Clinical Impression(s) / ED Diagnoses Final diagnoses:  Viral upper respiratory tract infection  Influenza A    Rx / DC Orders ED Discharge Orders          Ordered    benzonatate (TESSALON) 200 MG capsule  Every 8 hours        01/29/22 1129    hydrOXYzine (ATARAX) 25 MG tablet  Every 8 hours PRN        01/29/22 1202    loratadine (CLARITIN) 10 MG tablet  Daily        01/29/22 1202              Tacy Learn, PA-C 01/29/22 1233  Tretha Sciara, MD 02/07/22 [phone]

## 2022-01-29 NOTE — ED Triage Notes (Signed)
Patient c/o nasal congestion, a productive cough with yellow sputum, and a sore throat x 1 week. Patient went to [facility] UC x 2 days ago.

## 2022-01-29 NOTE — Discharge Instructions (Addendum)
Continue with saline sinus rinse, Zyrtec, Flonase and Atrovent nasal spray as provided by urgent care at your 01/27/22 visit. Unfortunately, a vial illness will take more time to resolve.   Recheck with your doctor if not improving. Return to urgent care for worsening symptoms, fever.   Follow up in your MyChart account for your COVID/flu test results. If you have COVID, please mask until 02/02/22.

## 2022-02-02 ENCOUNTER — Other Ambulatory Visit (HOSPITAL_COMMUNITY): Payer: Self-pay

## 2022-02-03 ENCOUNTER — Ambulatory Visit
Admission: EM | Admit: 2022-02-03 | Discharge: 2022-02-03 | Disposition: A | Discharge status: Home / Self Care | Payer: Medicaid Other | Attending: Urgent Care | Admitting: Urgent Care

## 2022-02-03 DIAGNOSIS — A6000 Herpesviral infection of urogenital system, unspecified: Secondary | ICD-10-CM | POA: Insufficient documentation

## 2022-02-03 DIAGNOSIS — R21 Rash and other nonspecific skin eruption: Secondary | ICD-10-CM | POA: Diagnosis present

## 2022-02-03 DIAGNOSIS — R0981 Nasal congestion: Secondary | ICD-10-CM | POA: Diagnosis not present

## 2022-02-03 DIAGNOSIS — J310 Chronic rhinitis: Secondary | ICD-10-CM | POA: Insufficient documentation

## 2022-02-03 MED ORDER — VALACYCLOVIR HCL 1 G PO TABS: 1000.0000 mg | ORAL_TABLET | Freq: Three times a day (TID) | ORAL | 0 refills | Status: DC

## 2022-02-03 MED ORDER — PSEUDOEPHEDRINE HCL 60 MG PO TABS: 60.0000 mg | ORAL_TABLET | Freq: Three times a day (TID) | ORAL | 0 refills | Status: DC | PRN

## 2022-02-03 NOTE — ED Provider Notes (Signed)
[name] - URGENT CARE C[facility]  Note:  This document was prepared using Systems analyst and may include unintentional dictation errors.  MRN: [id number] DOB: 06-07-1985  Subjective:   Sherri [name] is a 36 y.o. female presenting for 3-day history of painful rash to the buttock area.  Patient has a history of genital herpes but has had a on the labia.  Last breakout was years ago.  Unfortunately she did test positive for influenza at the end of November.  She still has a lingering cough, sinus congestion and drainage.  Has some intermittent body pains.  She is taking her medications for her allergies consistently but feels like they are not helping.  No overt chest pain, shortness of breath or wheezing.  No current facility-administered medications for this encounter.  Current Outpatient Medications:    amitriptyline (ELAVIL) 25 MG tablet, Take 2 tablets (50 mg total) by mouth at bedtime., Disp: 60 tablet, Rfl: 2   benzonatate (TESSALON) 200 MG capsule, Take 1 capsule (200 mg total) by mouth every 8 (eight) hours for 10 days., Disp: 30 capsule, Rfl: 0   cetirizine (ZYRTEC ALLERGY) 10 MG tablet, Take 1 tablet (10 mg total) by mouth at bedtime., Disp: 90 tablet, Rfl: 0   Cholecalciferol (VITAMIN D3) 125 MCG (5000 UT) CAPS, Take 1 capsule by mouth daily., Disp: , Rfl:    clindamycin (CLEOCIN T) 1 % lotion, Apply to cleansed face daily in the morning., Disp: 60 mL, Rfl: 1   famotidine (PEPCID) 40 MG tablet, Take 1 tablet (40 mg total) by mouth at bedtime., Disp: 90 tablet, Rfl: 0   Ferrous Sulfate (IRON) 28 MG TABS, Take 1 tablet by mouth daily., Disp: , Rfl:    fluticasone (FLONASE) 50 MCG/ACT nasal spray, Place 1 spray into both nostrils daily. Begin by using 2 sprays in each nare daily for 3 to 5 days, then decrease to 1 spray in each nare daily., Disp: 15.8 mL, Rfl: 2   hydrOXYzine (ATARAX) 25 MG tablet, Take 1 tablet (25 mg total) by mouth every 8 (eight) hours as  needed for itching., Disp: 30 tablet, Rfl: 0   ipratropium (ATROVENT) 0.06 % nasal spray, Place 2 sprays into both nostrils 3 (three) times daily. As needed for nasal congestion, runny nose, Disp: 15 mL, Rfl: 2   loratadine (CLARITIN) 10 MG tablet, Take 1 tablet (10 mg total) by mouth daily., Disp: 30 tablet, Rfl: 0   meloxicam (MOBIC) 7.5 MG tablet, Take 1 tablet (7.5 mg total) by mouth daily., Disp: 20 tablet, Rfl: 0   montelukast (SINGULAIR) 10 MG tablet, Take 1 tablet (10 mg total) by mouth at bedtime., Disp: 90 tablet, Rfl: 0   naproxen sodium (ALEVE) 220 MG tablet, Take 1 tablet (220 mg total) by mouth daily as needed., Disp: 90 tablet, Rfl: 0   pantoprazole (PROTONIX) 40 MG tablet, Take 1 tablet (40 mg total) by mouth daily., Disp: 30 tablet, Rfl: 6   polyethylene glycol powder (GLYCOLAX/MIRALAX) 17 GM/SCOOP powder, Take 17 g by mouth daily as needed for mild constipation., Disp: 238 g, Rfl: 0   SUMAtriptan (IMITREX) 25 MG tablet, Take 25 mg (1 tablet total) by mouth at the start of the headache. May repeat in 2 hours x 1 if headache persists. Max of 2 tablets/24 hours., Disp: 30 tablet, Rfl: 1   tretinoin (RETIN-A) 0.025 % cream, Apply pea size amount to cleansed face every other day at bedtime. 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    Urinary tract infection    Uterine fibroid       Past Surgical History:  Procedure Laterality Date   CESAREAN SECTION N/A 06/05/2014   Procedure: CESAREAN SECTION;  Surgeon: Truett Mainland, DO;  Location: [facility];  Service: Obstetrics;  Laterality: N/A;   WISDOM TOOTH EXTRACTION      Family History  Problem Relation Age of Onset   Hypertension Mother    Hypertension Father    Breast cancer Maternal Grandmother        65   Colon cancer Neg Hx    Colon polyps Neg Hx    Kidney disease Neg Hx    Diabetes Neg Hx    Esophageal cancer Neg Hx    Gallbladder disease Neg Hx    Heart disease Neg Hx    Asthma Neg Hx    Stroke Neg Hx    Stomach cancer Neg Hx     Social History   Tobacco Use   Smoking status: Never   Smokeless tobacco: Never  Vaping Use   Vaping Use: Never used  Substance Use Topics   Alcohol use: No   Drug use: No    ROS   Objective:   Vitals: BP 137/78 (BP Location: Right Arm)   Pulse 74   Temp 98.3 F (36.8 C) (Oral)   Resp 16   LMP 01/29/2022 (Approximate)   SpO2 98%   Physical Exam Constitutional:      General: She is not in acute distress.    Appearance: Normal appearance. She is well-developed. She is not ill-appearing, toxic-appearing or diaphoretic.  HENT:     Head: Normocephalic and atraumatic.     Nose: Congestion present. No rhinorrhea.     Mouth/Throat:     Mouth: Mucous membranes are moist.  Eyes:     General: No scleral icterus.       Right eye: No discharge.        Left eye: No discharge.     Extraocular Movements: Extraocular movements intact.  Cardiovascular:     Rate and Rhythm: Normal rate and regular rhythm.     Heart sounds: Normal heart sounds. No murmur heard.    No friction rub. No gallop.  Pulmonary:     Effort: Pulmonary effort is normal. No respiratory distress.     Breath sounds: No stridor. No wheezing, rhonchi or rales.  Chest:     Chest wall: No tenderness.  Skin:    General: Skin is warm and dry.       Neurological:     General: No focal deficit  present.     Mental Status: She is alert and oriented to person, place, and time.  Psychiatric:        Mood and Affect: Mood normal.  Behavior: Behavior normal.      Assessment and Plan :   PDMP not reviewed this encounter.  1. Rash   2. Genital herpes simplex, unspecified site   3. Nasal congestion   4. Chronic rhinitis     Suspect that the rash is general her base of the buttock area.  Recommended starting Valtrex.  HSV culture pending.  Otherwise, suspect that her respiratory symptoms are chronic in nature secondary to persistent allergic rhinitis, chronic rhinitis.  This is likely more bothersome due to her recent influenza diagnosis.  Placed a referral to an allergist.  Maintain daily maintenance allergic rhinitis medications.  Counseled patient on potential for adverse effects with medications prescribed/recommended today, ER and return-to-clinic precautions discussed, patient verbalized understanding.    Jaynee Eagles, [place] 02/03/22 [phone]

## 2022-02-03 NOTE — ED Triage Notes (Signed)
Pt c/o rash to buttocks x 3 days-also c/o upper back pain x 3 days-denies injury-NAD-steady gait

## 2022-02-05 LAB — HSV CULTURE AND TYPING

## 2022-02-06 ENCOUNTER — Ambulatory Visit
Admission: EM | Admit: 2022-02-06 | Discharge: 2022-02-06 | Disposition: A | Discharge status: Home / Self Care | Payer: Medicaid Other | Attending: Emergency Medicine | Admitting: Emergency Medicine

## 2022-02-06 ENCOUNTER — Telehealth: Payer: Self-pay

## 2022-02-06 DIAGNOSIS — B009 Herpesviral infection, unspecified: Secondary | ICD-10-CM | POA: Diagnosis not present

## 2022-02-06 DIAGNOSIS — T50905A Adverse effect of unspecified drugs, medicaments and biological substances, initial encounter: Secondary | ICD-10-CM | POA: Diagnosis not present

## 2022-02-06 MED ORDER — ONDANSETRON 4 MG PO TBDP: 4.0000 mg | ORAL_TABLET | Freq: Three times a day (TID) | ORAL | 0 refills | Status: DC | PRN

## 2022-02-06 MED ORDER — ACYCLOVIR 400 MG PO TABS: 400.0000 mg | ORAL_TABLET | Freq: Three times a day (TID) | ORAL | 0 refills | Status: AC

## 2022-02-06 MED ORDER — ACYCLOVIR-HYDROCORTISONE 5-1 % EX CREA: 1.0000 | TOPICAL_CREAM | Freq: Every day | CUTANEOUS | 1 refills | Status: DC

## 2022-02-06 NOTE — ED Provider Notes (Signed)
HPI  SUBJECTIVE:  Sherri [name] is a 36 y.o. female who presents with nausea, not feeling well after starting 1 g of Valtrex 3 times daily 3 days ago for HSV outbreak on her buttocks.  She states that the rash is still sore and itches.  She is taking the acyclovir with food.  She has been applying Vaseline, taking Tylenol and ibuprofen in addition to the Valtrex.  No alleviating factors for the pain.  Symptoms worse with sitting.   Patient denies history of diabetes, hypertension, chronic kidney disease.  Patient was seen here 3 days ago, found to have genital herpes located at the gluteal cleft.  Started on Valtrex.  Culture sent, which confirmed HSV 1.   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    Urinary tract infection    Uterine fibroid     Past Surgical History:  Procedure Laterality Date   CESAREAN SECTION N/A 06/05/2014   Procedure: CESAREAN SECTION;  Surgeon: Truett Mainland, DO;  Location: [facility];  Service: Obstetrics;  Laterality: N/A;   WISDOM TOOTH EXTRACTION      Family History  Problem Relation Age of Onset   Hypertension Mother    Hypertension Father    Breast cancer Maternal Grandmother        58   Colon cancer Neg Hx    Colon polyps Neg Hx    Kidney disease Neg Hx    Diabetes Neg Hx    Esophageal cancer Neg Hx    Gallbladder disease Neg Hx    Heart disease Neg Hx    Asthma Neg Hx    Stroke Neg Hx    Stomach cancer Neg Hx     Social History   Tobacco Use   Smoking status: Never   Smokeless tobacco: Never  Vaping Use   Vaping Use: Never used  Substance Use Topics   Alcohol use: No   Drug use: No    No current facility-administered medications for this encounter.  Current Outpatient Medications:    acyclovir (ZOVIRAX) 400 MG tablet, Take 1 tablet (400 mg  total) by mouth 3 (three) times daily for 10 days., Disp: 30 tablet, Rfl: 0   Acyclovir-Hydrocortisone 5-1 % CREA, Apply 1 Application topically 5 (five) times daily. X 5 days. Start at symptom onset, Disp: 5 g, Rfl: 1   amitriptyline (ELAVIL) 25 MG tablet, Take 2 tablets (50 mg total) by mouth at bedtime., Disp: 60 tablet, Rfl: 2   cetirizine (ZYRTEC ALLERGY) 10 MG tablet, Take 1 tablet (10 mg total) by mouth at bedtime., Disp: 90 tablet, Rfl: 0   Cholecalciferol (VITAMIN D3) 125 MCG (5000 UT) CAPS, Take 1 capsule by mouth daily., Disp: , Rfl:    Ferrous Sulfate (IRON) 28 MG TABS, Take 1 tablet by mouth daily., Disp: , Rfl:    fluticasone (FLONASE) 50 MCG/ACT nasal spray, Place 1 spray into both nostrils daily. Begin by using 2 sprays in each nare daily for 3 to 5 days, then decrease to 1 spray in each nare daily., Disp: 15.8 mL, Rfl: 2   ipratropium (ATROVENT) 0.06 % nasal spray, Place 2 sprays into both nostrils 3 (three) times daily. As needed for nasal congestion, runny nose, Disp: 15 mL, Rfl: 2   loratadine (CLARITIN) 10 MG tablet,  Take 1 tablet (10 mg total) by mouth daily., Disp: 30 tablet, Rfl: 0   montelukast (SINGULAIR) 10 MG tablet, Take 1 tablet (10 mg total) by mouth at bedtime., Disp: 90 tablet, Rfl: 0   ondansetron (ZOFRAN-ODT) 4 MG disintegrating tablet, Take 1 tablet (4 mg total) by mouth every 8 (eight) hours as needed for nausea or vomiting., Disp: 20 tablet, Rfl: 0   pantoprazole (PROTONIX) 40 MG tablet, Take 1 tablet (40 mg total) by mouth daily., Disp: 30 tablet, Rfl: 6   polyethylene glycol powder (GLYCOLAX/MIRALAX) 17 GM/SCOOP powder, Take 17 g by mouth daily as needed for mild constipation., Disp: 238 g, Rfl: 0   pseudoephedrine (SUDAFED) 60 MG tablet, Take 1 tablet (60 mg total) by mouth every 8 (eight) hours as needed for congestion., Disp: 30 tablet, Rfl: 0   clindamycin (CLEOCIN T) 1 % lotion, Apply to cleansed face daily in the morning., Disp: 60 mL, Rfl: 1   famotidine  (PEPCID) 40 MG tablet, Take 1 tablet (40 mg total) by mouth at bedtime., Disp: 90 tablet, Rfl: 0   SUMAtriptan (IMITREX) 25 MG tablet, Take 25 mg (1 tablet total) by mouth at the start of the headache. May repeat in 2 hours x 1 if headache persists. Max of 2 tablets/24 hours., Disp: 30 tablet, Rfl: 1   tretinoin (RETIN-A) 0.025 % cream, Apply pea size amount to cleansed face every other day at bedtime. Increase to every night as tolerated., Disp: 20 g, Rfl: 0   triamcinolone cream (KENALOG) 0.1 %, Apply to affected areas twice daily for 3 days on and 3 days off cycle or as needed., Disp: 80 g, Rfl: 0  Allergies  Allergen Reactions   Macrobid [Nitrofurantoin] Nausea And Vomiting   Augmentin [Amoxicillin-Pot Clavulanate]     Pt states it makes her sick   Cefdinir Itching   Metformin And Related Nausea And Vomiting     ROS  As noted in HPI.   Physical Exam  BP (!) 134/91 (BP Location: Right Arm)   Pulse 81   Temp 97.9 F (36.6 C) (Oral)   Resp 17   LMP 01/29/2022 (Approximate)   SpO2 97%   Constitutional: Well developed, well nourished, no acute distress Eyes:  EOMI, conjunctiva normal bilaterally HENT: Normocephalic, atraumatic,mucus membranes moist Respiratory: Normal inspiratory effort Cardiovascular: Normal rate GI: nondistended skin: Grouped tender vesicular rash on erythematous base between gluteal cleft with no surrounding induration.  Musculoskeletal: no deformities Neurologic: Alert & oriented x 3, no focal neuro deficits Psychiatric: Speech and behavior appropriate   ED Course   Medications - No data to display  No orders of the defined types were placed in this encounter.   No results found for this or any previous visit (from the past 24 hour(s)). No results found.  ED Clinical Impression  1. Adverse effects of medication, initial encounter   2. HSV-1 (herpes simplex virus 1) infection      ED Assessment/Plan     Reviewed records reviewed.  As  noted in HPI.  The rash is consistent with HSV.  Culture confirmed HSV 1.  It does not appear to be secondarily infected.  It appears that the Valtrex is causing a mild gastritis and the patient is having difficulty tolerating it.  We can try switching to acyclovir with lower dosage and more frequent dosing, but patient would like to try the acyclovir cream first.  Will send home with Zofran as well.  Follow-up with PCP as needed.  Discussed  MDM, treatment plan, and plan for follow-up with patient. Discussed sn/sx that should prompt return to the ED. patient agrees with plan.   Meds ordered this encounter  Medications   acyclovir (ZOVIRAX) 400 MG tablet    Sig: Take 1 tablet (400 mg total) by mouth 3 (three) times daily for 10 days.    Dispense:  30 tablet    Refill:  0   Acyclovir-Hydrocortisone 5-1 % CREA    Sig: Apply 1 Application topically 5 (five) times daily. X 5 days. Start at symptom onset    Dispense:  5 g    Refill:  1   ondansetron (ZOFRAN-ODT) 4 MG disintegrating tablet    Sig: Take 1 tablet (4 mg total) by mouth every 8 (eight) hours as needed for nausea or vomiting.    Dispense:  20 tablet    Refill:  0      *This clinic note was created using [employer]. Therefore, there may be occasional mistakes despite careful proofreading.  ?    Melynda Ripple, MD 02/06/22 [phone]

## 2022-02-06 NOTE — Telephone Encounter (Signed)
Patient called requesting a different cream. The Acyclovir-Hydrocortisone Cream is $1600

## 2022-02-06 NOTE — ED Triage Notes (Signed)
Pt states after starting Valacyclovir for herpes she states the medication is causing her nausea and dizziness. Pt states she continues to have soreness in her rectal area.

## 2022-02-06 NOTE — Discharge Instructions (Signed)
Your culture confirmed that this is HSV 1.  Try the acyclovir hydrocortisone cream, and if that does not work, then you have the acyclovir as a backup.  Zofran as needed for nausea.

## 2022-02-07 ENCOUNTER — Telehealth: Payer: Self-pay

## 2022-02-07 MED ORDER — ACYCLOVIR 5 % EX CREA: 1.0000 | TOPICAL_CREAM | Freq: Every day | CUTANEOUS | 0 refills | Status: DC

## 2022-02-07 MED ORDER — ACYCLOVIR 5 % EX CREA
1.0000 | TOPICAL_CREAM | Freq: Every day | CUTANEOUS | 0 refills | Status: DC
  Filled 2022-02-07: qty 5, 7d supply, fill #0

## 2022-02-07 NOTE — Telephone Encounter (Signed)
Medication changed from Acyclovir / hydrocortisone to Acyclovir due to cost

## 2022-02-07 NOTE — Telephone Encounter (Signed)
Pt needed a generic cream because the original is to expensive. New cream sent to [facility]

## 2022-02-09 ENCOUNTER — Other Ambulatory Visit (HOSPITAL_COMMUNITY): Payer: Self-pay

## 2022-02-15 ENCOUNTER — Encounter: Payer: Self-pay | Admitting: Certified Registered Nurse Anesthetist

## 2022-02-16 ENCOUNTER — Other Ambulatory Visit (HOSPITAL_COMMUNITY): Payer: Self-pay

## 2022-02-16 ENCOUNTER — Other Ambulatory Visit: Payer: Self-pay

## 2022-02-16 ENCOUNTER — Encounter: Payer: Medicaid Other | Admitting: Gastroenterology

## 2022-02-16 ENCOUNTER — Telehealth: Payer: Self-pay | Admitting: Gastroenterology

## 2022-02-16 NOTE — Telephone Encounter (Signed)
Please charge for [facility] no show/same day cancellation for a colonoscopy/EGD

## 2022-02-16 NOTE — Telephone Encounter (Signed)
Patient rescheduled for procedure for feb of 2024 due to her having to work .

## 2022-02-16 NOTE — Telephone Encounter (Signed)
Good Morning Dr. Fuller Plan,  I tried to call this patient at 7:20 this morning and the phone rang busy instantly.  I will no Show Patient- Self Pay

## 2022-02-19 ENCOUNTER — Ambulatory Visit
Admission: EM | Admit: 2022-02-19 | Discharge: 2022-02-19 | Discharge status: Against Medical Advice | Payer: Medicaid Other

## 2022-02-19 NOTE — ED Triage Notes (Signed)
Pt c/o congestion, and scratchy throat for about a week.  Home interventions: tylenol, sudafed

## 2022-02-20 ENCOUNTER — Other Ambulatory Visit (HOSPITAL_COMMUNITY): Payer: Self-pay

## 2022-02-20 ENCOUNTER — Other Ambulatory Visit: Payer: Self-pay

## 2022-02-21 ENCOUNTER — Ambulatory Visit
Admission: EM | Admit: 2022-02-21 | Discharge: 2022-02-21 | Disposition: A | Discharge status: Home / Self Care | Payer: Medicaid Other | Attending: Urgent Care | Admitting: Urgent Care

## 2022-02-21 DIAGNOSIS — L299 Pruritus, unspecified: Secondary | ICD-10-CM

## 2022-02-21 DIAGNOSIS — J309 Allergic rhinitis, unspecified: Secondary | ICD-10-CM

## 2022-02-21 MED ORDER — DEXAMETHASONE SODIUM PHOSPHATE 10 MG/ML IJ SOLN
10.0000 mg | Freq: Once | INTRAMUSCULAR | Status: AC
  Administered 2022-02-21: 10 mg via INTRAMUSCULAR

## 2022-02-21 MED ORDER — LEVOCETIRIZINE DIHYDROCHLORIDE 5 MG PO TABS: 5.0000 mg | ORAL_TABLET | Freq: Every evening | ORAL | 0 refills | Status: DC

## 2022-02-21 NOTE — ED Provider Notes (Signed)
[name]  Note:  This document was prepared using Systems analyst and may include unintentional dictation errors.  MRN: [id number] DOB: Mar 14, 1985  Subjective:   Sherri [name] is a 36 y.o. female presenting for persistent or recurrent itching throughout her body.  She also has persistent sinus congestion and drainage.  No fever, facial or oral swelling, chest tightness, shortness of breath, nausea, vomiting, abdominal pain.  Patient has recently undergone treatment for herpes simplex outbreak of the buttock area.  I initially saw her at the beginning of December and started her on Valtrex.  Unfortunately she did not tolerate this medication due to GI side effects.  She was subsequently switched to acyclovir.  She completed the course.  And thereafter started to have this particular itching.  No new exposures.  Patient is compliant with her allergy medications including Flonase, singular, Claritin (what she was last prescribed).  She has an appointment with her PCP coming up.  Has not seen an allergist.  No current facility-administered medications for this encounter.  Current Outpatient Medications:    acyclovir cream (ZOVIRAX) 5 %, Apply 1 Application topically 5 (five) times daily., Disp: 5 g, Rfl: 0   amitriptyline (ELAVIL) 25 MG tablet, Take 2 tablets (50 mg total) by mouth at bedtime., Disp: 60 tablet, Rfl: 2   cetirizine (ZYRTEC ALLERGY) 10 MG tablet, Take 1 tablet (10 mg total) by mouth at bedtime., Disp: 90 tablet, Rfl: 0   Cholecalciferol (VITAMIN D3) 125 MCG (5000 UT) CAPS, Take 1 capsule by mouth daily., Disp: , Rfl:    clindamycin (CLEOCIN T) 1 % lotion, Apply to cleansed face daily in the morning., Disp: 60 mL, Rfl: 1   famotidine (PEPCID) 40 MG tablet, Take 1 tablet (40 mg total) by mouth at bedtime., Disp: 90 tablet, Rfl: 0   Ferrous Sulfate (IRON) 28 MG TABS, Take 1 tablet by mouth daily., Disp: , Rfl:    fluticasone (FLONASE) 50  MCG/ACT nasal spray, Place 1 spray into both nostrils daily. Begin by using 2 sprays in each nare daily for 3 to 5 days, then decrease to 1 spray in each nare daily., Disp: 15.8 mL, Rfl: 2   ipratropium (ATROVENT) 0.06 % nasal spray, Place 2 sprays into both nostrils 3 (three) times daily. As needed for nasal congestion, runny nose, Disp: 15 mL, Rfl: 2   loratadine (CLARITIN) 10 MG tablet, Take 1 tablet (10 mg total) by mouth daily., Disp: 30 tablet, Rfl: 0   montelukast (SINGULAIR) 10 MG tablet, Take 1 tablet (10 mg total) by mouth at bedtime., Disp: 90 tablet, Rfl: 0   ondansetron (ZOFRAN-ODT) 4 MG disintegrating tablet, Take 1 tablet (4 mg total) by mouth every 8 (eight) hours as needed for nausea or vomiting., Disp: 20 tablet, Rfl: 0   pantoprazole (PROTONIX) 40 MG tablet, Take 1 tablet (40 mg total) by mouth daily., Disp: 30 tablet, Rfl: 6   polyethylene glycol powder (GLYCOLAX/MIRALAX) 17 GM/SCOOP powder, Take 17 g by mouth daily as needed for mild constipation., Disp: 238 g, Rfl: 0   pseudoephedrine (SUDAFED) 60 MG tablet, Take 1 tablet (60 mg total) by mouth every 8 (eight) hours as needed for congestion., Disp: 30 tablet, Rfl: 0   SUMAtriptan (IMITREX) 25 MG tablet, Take 25 mg (1 tablet total) by mouth at the start of the headache. May repeat in 2 hours x 1 if headache persists. Max of 2 tablets/24 hours., Disp: 30 tablet, Rfl: 1   tretinoin (  RETIN-A) 0.025 % cream, Apply pea size amount to cleansed face every other day at bedtime. Increase to every night as tolerated., Disp: 20 g, Rfl: 0   triamcinolone cream (KENALOG) 0.1 %, Apply to affected areas twice daily for 3 days on and 3 days off cycle or as needed., Disp: 80 g, Rfl: 0   Allergies  Allergen Reactions   Macrobid [Nitrofurantoin] Nausea And Vomiting   Augmentin [Amoxicillin-Pot Clavulanate]     Pt states it makes her sick   Cefdinir Itching   Metformin And Related Nausea And Vomiting    Past Medical History:  Diagnosis Date    Diabetes mellitus without complication ([facility])    Fibromyalgia    denies today   Genital herpes    Hypertension    gestational   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    Urinary tract infection    Uterine fibroid      Past Surgical History:  Procedure Laterality Date   CESAREAN SECTION N/A 06/05/2014   Procedure: CESAREAN SECTION;  Surgeon: Truett Mainland, DO;  Location: [facility];  Service: Obstetrics;  Laterality: N/A;   WISDOM TOOTH EXTRACTION      Family History  Problem Relation Age of Onset   Hypertension Mother    Hypertension Father    Breast cancer Maternal Grandmother        35   Colon cancer Neg Hx    Colon polyps Neg Hx    Kidney disease Neg Hx    Diabetes Neg Hx    Esophageal cancer Neg Hx    Gallbladder disease Neg Hx    Heart disease Neg Hx    Asthma Neg Hx    Stroke Neg Hx    Stomach cancer Neg Hx     Social History   Tobacco Use   Smoking status: Never   Smokeless tobacco: Never  Vaping Use   Vaping Use: Never used  Substance Use Topics   Alcohol use: No   Drug use: No    ROS   Objective:   Vitals: BP 133/85 (BP Location: Right Arm)   Pulse 72   Temp 98.4 F (36.9 C) (Oral)   Resp 20   LMP 01/29/2022 (Approximate)   SpO2 95%   Physical Exam Constitutional:      General: She is not in acute distress.    Appearance: Normal appearance. She is well-developed and normal weight. She is not ill-appearing, toxic-appearing or diaphoretic.  HENT:     Head: Normocephalic and atraumatic.     Right Ear: Tympanic membrane, ear canal and external ear normal. No drainage or tenderness. No middle ear effusion. There is no impacted cerumen. Tympanic membrane is not erythematous or bulging.     Left Ear: Tympanic membrane, ear canal and external ear normal. No drainage or tenderness.  No middle ear effusion. There is no impacted cerumen. Tympanic membrane is not erythematous or bulging.     Nose: Nose normal. No congestion or  rhinorrhea.     Mouth/Throat:     Mouth: Mucous membranes are moist. No oral lesions.     Pharynx: No pharyngeal swelling, oropharyngeal exudate, posterior oropharyngeal erythema or uvula swelling.     Tonsils: No tonsillar exudate or tonsillar abscesses.  Eyes:     General: No scleral icterus.       Right eye: No discharge.        Left eye: No discharge.     Extraocular Movements: Extraocular movements intact.  Right eye: Normal extraocular motion.     Left eye: Normal extraocular motion.     Conjunctiva/sclera: Conjunctivae normal.  Cardiovascular:     Rate and Rhythm: Normal rate and regular rhythm.     Heart sounds: Normal heart sounds. No murmur heard.    No friction rub. No gallop.  Pulmonary:     Effort: Pulmonary effort is normal. No respiratory distress.     Breath sounds: No stridor. No wheezing, rhonchi or rales.  Chest:     Chest wall: No tenderness.  Musculoskeletal:     Cervical back: Normal range of motion and neck supple.  Lymphadenopathy:     Cervical: No cervical adenopathy.  Skin:    General: Skin is warm and dry.     Findings: No rash.     Comments: Pruritus noted with patient scratching at her arms consistently throughout the visit.  Neurological:     General: No focal deficit present.     Mental Status: She is alert and oriented to person, place, and time.  Psychiatric:        Mood and Affect: Mood normal.        Behavior: Behavior normal.     Assessment and Plan :   PDMP not reviewed this encounter.  1. Allergic rhinitis, unspecified seasonality, unspecified trigger   2. Itching     Recommended consultation with an allergist and placed a referral.  Switch from Claritin to [place].  Avoid any new exposures.  Keep appointment with her PCP for further workup including blood work to rule out inflammatory processes. Counseled patient on potential for adverse effects with medications prescribed/recommended today, ER and return-to-clinic precautions  discussed, patient verbalized understanding.    Jaynee Eagles, [place] 02/21/22 [phone]

## 2022-02-21 NOTE — ED Triage Notes (Signed)
Pt reports her body has been itchy excessively x 7 days. Reports she didn't take any new meds  and she switched to sensitive laundry detergent but still itches. Pt has Sinus headaches and took ibuprofen but no relief.

## 2022-02-24 ENCOUNTER — Other Ambulatory Visit: Payer: Self-pay

## 2022-03-02 ENCOUNTER — Telehealth: Payer: Self-pay

## 2022-03-02 ENCOUNTER — Ambulatory Visit
Admission: EM | Admit: 2022-03-02 | Discharge: 2022-03-02 | Disposition: A | Discharge status: Home / Self Care | Payer: Medicaid Other | Attending: Urgent Care | Admitting: Urgent Care

## 2022-03-02 DIAGNOSIS — J0181 Other acute recurrent sinusitis: Secondary | ICD-10-CM | POA: Diagnosis not present

## 2022-03-02 DIAGNOSIS — J309 Allergic rhinitis, unspecified: Secondary | ICD-10-CM

## 2022-03-02 MED ORDER — CIPROFLOXACIN HCL 500 MG PO TABS: 500.0000 mg | ORAL_TABLET | Freq: Two times a day (BID) | ORAL | 0 refills | Status: DC

## 2022-03-02 MED ORDER — FLUCONAZOLE 150 MG PO TABS: 150.0000 mg | ORAL_TABLET | ORAL | 0 refills | Status: DC

## 2022-03-02 MED ORDER — PREDNISONE 20 MG PO TABS: ORAL_TABLET | ORAL | 0 refills | Status: DC

## 2022-03-02 MED ORDER — AMOXICILLIN 875 MG PO TABS: 875.0000 mg | ORAL_TABLET | Freq: Two times a day (BID) | ORAL | 0 refills | Status: DC

## 2022-03-02 NOTE — ED Provider Notes (Signed)
[name]  Note:  This document was prepared using Systems analyst and may include unintentional dictation errors.  MRN: [id number] DOB: [birth year]  Subjective:   Sherri [name] is a 38 y.o. female presenting for 1 week history of persistent recurrent sinus congestion, runny nose, throat pain, sinus drainage.  She has not yet seen the allergist as previously planned.  She is taking her allergy medications.  No chest pain, shortness of breath or wheezing.  Does not want a COVID test.  No current facility-administered medications for this encounter.  Current Outpatient Medications:    acyclovir cream (ZOVIRAX) 5 %, Apply 1 Application topically 5 (five) times daily., Disp: 5 g, Rfl: 0   amitriptyline (ELAVIL) 25 MG tablet, Take 2 tablets (50 mg total) by mouth at bedtime., Disp: 60 tablet, Rfl: 2   Cholecalciferol (VITAMIN D3) 125 MCG (5000 UT) CAPS, Take 1 capsule by mouth daily., Disp: , Rfl:    clindamycin (CLEOCIN T) 1 % lotion, Apply to cleansed face daily in the morning., Disp: 60 mL, Rfl: 1   famotidine (PEPCID) 40 MG tablet, Take 1 tablet (40 mg total) by mouth at bedtime., Disp: 90 tablet, Rfl: 0   Ferrous Sulfate (IRON) 28 MG TABS, Take 1 tablet by mouth daily., Disp: , Rfl:    fluticasone (FLONASE) 50 MCG/ACT nasal spray, Place 1 spray into both nostrils daily. Begin by using 2 sprays in each nare daily for 3 to 5 days, then decrease to 1 spray in each nare daily., Disp: 15.8 mL, Rfl: 2   ipratropium (ATROVENT) 0.06 % nasal spray, Place 2 sprays into both nostrils 3 (three) times daily. As needed for nasal congestion, runny nose, Disp: 15 mL, Rfl: 2   levocetirizine (XYZAL) 5 MG tablet, Take 1 tablet (5 mg total) by mouth every evening., Disp: 90 tablet, Rfl: 0   montelukast (SINGULAIR) 10 MG tablet, Take 1 tablet (10 mg total) by mouth at bedtime., Disp: 90 tablet, Rfl: 0   ondansetron (ZOFRAN-ODT) 4 MG disintegrating tablet, Take 1  tablet (4 mg total) by mouth every 8 (eight) hours as needed for nausea or vomiting., Disp: 20 tablet, Rfl: 0   pantoprazole (PROTONIX) 40 MG tablet, Take 1 tablet (40 mg total) by mouth daily., Disp: 30 tablet, Rfl: 6   polyethylene glycol powder (GLYCOLAX/MIRALAX) 17 GM/SCOOP powder, Take 17 g by mouth daily as needed for mild constipation., Disp: 238 g, Rfl: 0   pseudoephedrine (SUDAFED) 60 MG tablet, Take 1 tablet (60 mg total) by mouth every 8 (eight) hours as needed for congestion., Disp: 30 tablet, Rfl: 0   SUMAtriptan (IMITREX) 25 MG tablet, Take 25 mg (1 tablet total) by mouth at the start of the headache. May repeat in 2 hours x 1 if headache persists. Max of 2 tablets/24 hours., Disp: 30 tablet, Rfl: 1   tretinoin (RETIN-A) 0.025 % cream, Apply pea size amount to cleansed face every other day at bedtime. Increase to every night as tolerated., Disp: 20 g, Rfl: 0   triamcinolone cream (KENALOG) 0.1 %, Apply to affected areas twice daily for 3 days on and 3 days off cycle or as needed., Disp: 80 g, Rfl: 0   Allergies  Allergen Reactions   Macrobid [Nitrofurantoin] Nausea And Vomiting   Augmentin [Amoxicillin-Pot Clavulanate]     Pt states it makes her sick   Cefdinir Itching   Metformin And Related Nausea And Vomiting    Past Medical History:  Diagnosis Date  Diabetes mellitus without complication ([place])    Fibromyalgia    denies today   Genital herpes    Hypertension    gestational   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    Urinary tract infection    Uterine fibroid      Past Surgical History:  Procedure Laterality Date   CESAREAN SECTION N/A 06/05/2014   Procedure: CESAREAN SECTION;  Surgeon: Truett Mainland, DO;  Location: [facility];  Service: Obstetrics;  Laterality: N/A;   WISDOM TOOTH EXTRACTION      Family History  Problem Relation Age of Onset   Hypertension Mother    Hypertension Father    Breast cancer Maternal Grandmother        26   Colon  cancer Neg Hx    Colon polyps Neg Hx    Kidney disease Neg Hx    Diabetes Neg Hx    Esophageal cancer Neg Hx    Gallbladder disease Neg Hx    Heart disease Neg Hx    Asthma Neg Hx    Stroke Neg Hx    Stomach cancer Neg Hx     Social History   Tobacco Use   Smoking status: Never   Smokeless tobacco: Never  Vaping Use   Vaping Use: Never used  Substance Use Topics   Alcohol use: No   Drug use: No    ROS   Objective:   Vitals: Ht '4\' 11"'$  (1.499 m)   Wt 185 lb (83.9 kg)   LMP 02/23/2022 (Approximate)   BMI 37.37 kg/m   Physical Exam Constitutional:      General: She is not in acute distress.    Appearance: Normal appearance. She is well-developed and normal weight. She is not ill-appearing, toxic-appearing or diaphoretic.  HENT:     Head: Normocephalic and atraumatic.     Right Ear: Tympanic membrane, ear canal and external ear normal. No drainage or tenderness. No middle ear effusion. There is no impacted cerumen. Tympanic membrane is not erythematous or bulging.     Left Ear: Tympanic membrane, ear canal and external ear normal. No drainage or tenderness.  No middle ear effusion. There is no impacted cerumen. Tympanic membrane is not erythematous or bulging.     Nose: Congestion and rhinorrhea present.     Mouth/Throat:     Mouth: Mucous membranes are moist. No oral lesions.     Pharynx: No pharyngeal swelling, oropharyngeal exudate, posterior oropharyngeal erythema or uvula swelling.     Tonsils: No tonsillar exudate or tonsillar abscesses.     Comments: Thick post drainage overlying pharynx. Eyes:     General: No scleral icterus.       Right eye: No discharge.        Left eye: No discharge.     Extraocular Movements: Extraocular movements intact.     Right eye: Normal extraocular motion.     Left eye: Normal extraocular motion.     Conjunctiva/sclera: Conjunctivae normal.  Cardiovascular:     Rate and Rhythm: Normal rate and regular rhythm.     Heart sounds:  Normal heart sounds. No murmur heard.    No friction rub. No gallop.  Pulmonary:     Effort: Pulmonary effort is normal. No respiratory distress.     Breath sounds: No stridor. No wheezing, rhonchi or rales.  Chest:     Chest wall: No tenderness.  Musculoskeletal:     Cervical back: Normal range of motion and neck supple.  Lymphadenopathy:  Cervical: No cervical adenopathy.  Skin:    General: Skin is warm and dry.  Neurological:     General: No focal deficit present.     Mental Status: She is alert and oriented to person, place, and time.  Psychiatric:        Mood and Affect: Mood normal.        Behavior: Behavior normal.     Assessment and Plan :   PDMP not reviewed this encounter.  1. Other acute recurrent sinusitis   2. Allergic rhinitis, unspecified seasonality, unspecified trigger     We will treat recurrent sinusitis with amoxicillin.  She is previously taking this without any issues.  Has not done well with Augmentin and doxycycline and therefore we will defer this.  Maintain allergy medications.  Will use 1 more round of prednisone.  Emphasized need to follow-up with the allergist. Deferred imaging given clear cardiopulmonary exam, hemodynamically stable vital signs. Counseled patient on potential for adverse effects with medications prescribed/recommended today, ER and return-to-clinic precautions discussed, patient verbalized understanding.    Jaynee Eagles, PA-C 03/02/22 1030

## 2022-03-02 NOTE — ED Triage Notes (Signed)
Pt states that she has a sore throat and stuffy nose. X1 week

## 2022-03-02 NOTE — Telephone Encounter (Signed)
Patient called stating after she took the amoxicillin she started feeling sleepy, weakness. Would like to know if there is another option.

## 2022-03-02 NOTE — Telephone Encounter (Signed)
Spoke to patient and informed her Per [place], [place], He has sent in a new RX for her.

## 2022-03-02 NOTE — Telephone Encounter (Signed)
Will switch patient from amoxicillin to ciprofloxacin. Please let her know. Script sent to the pharmacy at [facility].

## 2022-03-03 ENCOUNTER — Emergency Department (HOSPITAL_COMMUNITY)
Admission: EM | Admit: 2022-03-03 | Discharge: 2022-03-03 | Disposition: A | Discharge status: Home / Self Care | Payer: Medicaid Other | Attending: Emergency Medicine | Admitting: Emergency Medicine

## 2022-03-03 DIAGNOSIS — Z1152 Encounter for screening for COVID-19: Secondary | ICD-10-CM | POA: Diagnosis not present

## 2022-03-03 DIAGNOSIS — J029 Acute pharyngitis, unspecified: Secondary | ICD-10-CM | POA: Insufficient documentation

## 2022-03-03 DIAGNOSIS — Z8616 Personal history of COVID-19: Secondary | ICD-10-CM | POA: Insufficient documentation

## 2022-03-03 DIAGNOSIS — E119 Type 2 diabetes mellitus without complications: Secondary | ICD-10-CM | POA: Diagnosis not present

## 2022-03-03 LAB — RESP PANEL BY RT-PCR (RSV, FLU A&B, COVID)  RVPGX2
Influenza A by PCR: NEGATIVE
Influenza B by PCR: NEGATIVE
Resp Syncytial Virus by PCR: NEGATIVE
SARS Coronavirus 2 by RT PCR: NEGATIVE

## 2022-03-03 LAB — GROUP A STREP BY PCR: Group A Strep by PCR: NOT DETECTED

## 2022-03-03 NOTE — ED Triage Notes (Signed)
Pt reports headache, sore throat and malaise for the last week. Recently seen at [facility] for same.

## 2022-03-03 NOTE — ED Provider Notes (Signed)
[facility] DEPT Provider Note  CSN: [id number] Arrival date & time: 03/03/22 2633  Chief Complaint(s) Headache and Sore Throat (/)  HPI Sherri [name] is a 37 y.o. female with history of diabetes presenting with sore throat and "feeling bad".  Patient denies any fevers.  She reports mild headache.  She reports around 1 week of symptoms.  No difficulty swallowing, trouble breathing, cough.  Reports some runny nose.  She was seen yesterday in urgent care for similar symptoms and started on prednisone and medication for possible sinusitis.  Symptoms are mild.  She presented to the emergency department just because her symptoms were persisting.   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 Authorizing Provider  acyclovir cream (ZOVIRAX) 5 % Apply 1 Application topically 5 (five) times daily. 02/07/22   Peri Jefferson, PA-C  amitriptyline (ELAVIL) 25 MG tablet Take 2 tablets (50 mg total) by mouth at bedtime. 06/24/21 03/22/22  Mayers, Cari S, PA-C  amoxicillin (AMOXIL) 875 MG tablet Take 1 tablet (875 mg total) by mouth 2 (two) times daily. 03/02/22   Jaynee Eagles, PA-C  Cholecalciferol (VITAMIN D3) 125 MCG (5000 UT) CAPS Take 1 capsule by mouth daily.    [provider]  ciprofloxacin (CIPRO) 500 MG tablet Take 1 tablet (500 mg total) by mouth 2 (two) times daily. 03/02/22   Jaynee Eagles, PA-C  clindamycin (CLEOCIN T) 1 % lotion Apply to cleansed face daily in the morning. 06/30/21     famotidine (PEPCID) 40 MG tablet Take 1 tablet (40 mg total) by mouth at bedtime. 01/27/22 04/27/22  Lynden Oxford Scales, PA-C  Ferrous Sulfate (IRON) 28 MG TABS Take 1 tablet by mouth daily.    [provider]  fluconazole (DIFLUCAN) 150 MG tablet Take 1 tablet (150 mg total) by mouth every 3 (three) days. 03/02/22   Jaynee Eagles, PA-C  fluticasone (FLONASE) 50 MCG/ACT nasal spray Place 1 spray into both nostrils daily. Begin by using 2 sprays in each nare daily for 3 to 5 days, then decrease to 1 spray in each nare daily. 01/27/22   Lynden Oxford Scales, PA-C  ipratropium (ATROVENT) 0.06 % nasal spray Place 2 sprays into both nostrils 3 (three) times daily. As needed for nasal congestion, runny nose 01/27/22   Lynden Oxford Scales, PA-C  levocetirizine (XYZAL) 5 MG tablet Take 1 tablet (5 mg total) by mouth every evening. 02/21/22   Jaynee Eagles, PA-C  montelukast (SINGULAIR) 10 MG tablet Take 1 tablet (10 mg total) by mouth at bedtime. 01/27/22 04/27/22  Lynden Oxford Scales, PA-C  ondansetron (ZOFRAN-ODT) 4 MG disintegrating tablet Take 1 tablet (4  mg total) by mouth every 8 (eight) hours as needed for nausea or vomiting. 02/06/22   Melynda Ripple, MD  pantoprazole (PROTONIX) 40 MG tablet Take 1 tablet (40 mg total) by mouth daily. 11/17/21   Willia Craze, NP  polyethylene glycol powder (GLYCOLAX/MIRALAX) 17 GM/SCOOP powder Take 17 g by mouth daily as needed for mild constipation. 09/27/21   Carlisle Cater, PA-C  predniSONE (DELTASONE) 20 MG tablet Take 2 tablets daily with breakfast. 03/02/22   Jaynee Eagles, PA-C  pseudoephedrine (SUDAFED) 60 MG  tablet Take 1 tablet (60 mg total) by mouth every 8 (eight) hours as needed for congestion. 02/03/22   Jaynee Eagles, PA-C  SUMAtriptan (IMITREX) 25 MG tablet Take 25 mg (1 tablet total) by mouth at the start of the headache. May repeat in 2 hours x 1 if headache persists. Max of 2 tablets/24 hours. 12/01/21   Camillia Herter, NP  tretinoin (RETIN-A) 0.025 % cream Apply pea size amount to cleansed face every other day at bedtime. Increase to every night as tolerated. 06/30/21     triamcinolone cream (KENALOG) 0.1 % Apply to affected areas twice daily for 3 days on and 3 days off cycle or as needed. 06/30/21                                                                                                                                       Past Surgical History Past Surgical History:  Procedure Laterality Date   CESAREAN SECTION N/A 06/05/2014   Procedure: CESAREAN SECTION;  Surgeon: Truett Mainland, DO;  Location: [facility];  Service: Obstetrics;  Laterality: N/A;   WISDOM TOOTH EXTRACTION     Family History Family History  Problem Relation Age of Onset   Hypertension Mother    Hypertension Father    Breast cancer Maternal Grandmother        34   Colon cancer Neg Hx    Colon polyps Neg Hx    Kidney disease Neg Hx    Diabetes Neg Hx    Esophageal cancer Neg Hx    Gallbladder disease Neg Hx    Heart disease Neg Hx    Asthma Neg Hx    Stroke Neg Hx    Stomach cancer Neg Hx     Social History Social History   Tobacco Use   Smoking status: Never   Smokeless tobacco: Never  Vaping Use   Vaping Use: Never used  Substance Use Topics   Alcohol use: No   Drug use: No   Allergies Macrobid [nitrofurantoin], Augmentin [amoxicillin-pot clavulanate], Cefdinir, and Metformin and related  Review of Systems Review of Systems  All other systems reviewed and are negative.   Physical Exam Vital Signs  I have reviewed the triage vital signs BP (!) 139/91 (BP Location: Left Arm)   Pulse 89   Temp  98.4 F (36.9 C) (Oral)   Resp 17  Ht '4\' 11"'$  (1.499 m)   Wt 83.9 kg   LMP 02/23/2022 (Approximate)   SpO2 99%   BMI 37.37 kg/m  Physical Exam Vitals and nursing note reviewed.  Constitutional:      General: She is not in acute distress.    Appearance: She is well-developed.  HENT:     Head: Normocephalic and atraumatic.     Nose: Rhinorrhea present.     Mouth/Throat:     Mouth: Mucous membranes are moist.     Pharynx: Uvula midline. Oropharyngeal exudate and posterior oropharyngeal erythema present. No uvula swelling.     Tonsils: Tonsillar exudate present.  Eyes:     Pupils: Pupils are equal, round, and reactive to light.  Cardiovascular:     Rate and Rhythm: Normal rate and regular rhythm.     Heart sounds: No murmur heard. Pulmonary:     Effort: Pulmonary effort is normal. No respiratory distress.     Breath sounds: Normal breath sounds.  Abdominal:     General: Abdomen is flat.     Palpations: Abdomen is soft.     Tenderness: There is no abdominal tenderness.  Musculoskeletal:        General: No tenderness.     Right lower leg: No edema.     Left lower leg: No edema.  Skin:    General: Skin is warm and dry.  Neurological:     General: No focal deficit present.     Mental Status: She is alert. Mental status is at baseline.  Psychiatric:        Mood and Affect: Mood normal.        Behavior: Behavior normal.     ED Results and Treatments Labs (all labs ordered are listed, but only abnormal results are displayed) Labs Reviewed  RESP PANEL BY RT-PCR (RSV, FLU A&B, COVID)  RVPGX2  GROUP A STREP BY PCR                                                                                                                          Radiology No results found.  Pertinent labs & imaging results that were available during my care of the patient were reviewed by me and considered in my medical decision making (see MDM for details).  Medications Ordered in ED Medications -  No data to display  Procedures Procedures  (including critical care time)  Medical Decision Making / ED Course   MDM:  37 year old female presenting to the emergency department for sore throat.  Patient well-appearing, has tonsillar exudate without evidence of peritonsillar abscess, no physical exam findings concerning for Ludwig's angina, retropharyngeal abscess or deep space neck infection.  She does have rhinorrhea which could represent sinusitis so advised patient to continue her course of antibiotics and steroids.  COVID and strep tests are negative.  Advised close follow-up with primary physician. Will discharge patient to home. All questions answered. Patient comfortable with plan of discharge. Return precautions discussed with patient and specified on the after visit summary.       Additional history obtained:  -External records from outside source obtained and reviewed including: Chart review including previous notes, labs, imaging, consultation notes including UC visit yesterday   Lab Tests: -I ordered, reviewed, and interpreted labs.   The pertinent results include:   Labs Reviewed  RESP PANEL BY RT-PCR (RSV, FLU A&B, COVID)  RVPGX2  GROUP A STREP BY PCR    Notable for negative flu/covid/rsv and strep  Medicines ordered and prescription drug management: No orders of the defined types were placed in this encounter.   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    Final Clinical Impression(s) / ED Diagnoses Final diagnoses:  Sore throat     This chart was dictated using voice recognition software.  Despite best efforts to proofread,  errors can occur which can change the documentation meaning.    Cristie Hem, MD 03/03/22 [phone]

## 2022-03-03 NOTE — Discharge Instructions (Signed)
We evaluated you for your sore throat.  Your COVID, flu, and strep test were negative.  Please follow-up with your primary doctor and your allergy doctor.  Please continue to take the amoxicillin and prednisone you are prescribed by urgent care.  Please return to the emergency department if you develop any new or worsening symptoms such as difficulty swallowing, difficulty breathing, swelling to the face, severe pain, or any other concerning symptoms.

## 2022-03-03 NOTE — ED Notes (Signed)
This Probation officer in to discharge pt. Pt requesting different home medication. Attempted to ask pt which medication or abx has worked for her in the past, so I could relay to [place]. Pt states she doesn't want a different abx, when asking pt what type of medication she was looking for, pt started cussing at this writer stating "none of you fuckers help me ever" Pt ambulated out of ED with steady gait, clear speech. Refusing any further explanation of d/c paperwork.

## 2022-03-08 ENCOUNTER — Ambulatory Visit (INDEPENDENT_AMBULATORY_CARE_PROVIDER_SITE_OTHER): Payer: Medicaid Other

## 2022-03-08 ENCOUNTER — Ambulatory Visit
Admission: EM | Admit: 2022-03-08 | Discharge: 2022-03-08 | Disposition: A | Discharge status: Home / Self Care | Payer: Medicaid Other | Attending: Urgent Care | Admitting: Urgent Care

## 2022-03-08 DIAGNOSIS — R5381 Other malaise: Secondary | ICD-10-CM | POA: Diagnosis not present

## 2022-03-08 DIAGNOSIS — J310 Chronic rhinitis: Secondary | ICD-10-CM | POA: Insufficient documentation

## 2022-03-08 DIAGNOSIS — R5383 Other fatigue: Secondary | ICD-10-CM | POA: Insufficient documentation

## 2022-03-08 DIAGNOSIS — R0989 Other specified symptoms and signs involving the circulatory and respiratory systems: Secondary | ICD-10-CM

## 2022-03-08 DIAGNOSIS — R059 Cough, unspecified: Secondary | ICD-10-CM | POA: Diagnosis not present

## 2022-03-08 DIAGNOSIS — R350 Frequency of micturition: Secondary | ICD-10-CM | POA: Insufficient documentation

## 2022-03-08 DIAGNOSIS — Z1152 Encounter for screening for COVID-19: Secondary | ICD-10-CM | POA: Insufficient documentation

## 2022-03-08 DIAGNOSIS — R7303 Prediabetes: Secondary | ICD-10-CM | POA: Diagnosis present

## 2022-03-08 LAB — POCT URINALYSIS DIP (MANUAL ENTRY)
Bilirubin, UA: NEGATIVE
Blood, UA: NEGATIVE
Glucose, UA: NEGATIVE mg/dL
Ketones, POC UA: NEGATIVE mg/dL
Leukocytes, UA: NEGATIVE
Nitrite, UA: NEGATIVE
Protein Ur, POC: NEGATIVE mg/dL
Spec Grav, UA: 1.02 (ref 1.010–1.025)
Urobilinogen, UA: 0.2 E.U./dL
pH, UA: 6.5 (ref 5.0–8.0)

## 2022-03-08 LAB — POCT URINE PREGNANCY: Preg Test, Ur: NEGATIVE

## 2022-03-08 MED ORDER — PSEUDOEPHEDRINE HCL 60 MG PO TABS: 60.0000 mg | ORAL_TABLET | Freq: Three times a day (TID) | ORAL | 0 refills | Status: DC | PRN

## 2022-03-08 MED ORDER — FLUTICASONE PROPIONATE 50 MCG/ACT NA SUSP: 1.0000 | Freq: Every day | NASAL | 5 refills | Status: DC

## 2022-03-08 MED ORDER — FEXOFENADINE HCL 180 MG PO TABS: 180.0000 mg | ORAL_TABLET | Freq: Every day | ORAL | 3 refills | Status: DC

## 2022-03-08 NOTE — ED Provider Notes (Signed)
[name] - URGENT CARE C[facility]  Note:  This document was prepared using Systems analyst and may include unintentional dictation errors.  MRN: [id number] DOB: [birth year]  Subjective:   Sherri [name] is a 37 y.o. female presenting for recheck on persistent malaise.  Primarily has sinus congestion, chest congestion, headaches, coughing, body pains.  Has urinary frequency.  Patient has had multiple visits to urgent care but also [facility] emergency room.  She has already been seen twice this year.  She had a respiratory panel done that was negative.  She tested negative for strep.  She also had an annual physical 02/26/2022.  Of particular concern HIV testing was negative.  A1c level was prediabetes range at 5.9%.  All other labs fairly unremarkable.  I did place a referral to an allergist as I suspect that this is the primary issue for the patient.  Has an appointment for the end of the month.  The last regimen that she has been on the ciprofloxacin and prednisone which she completed together with fluconazole for secondary yeast vaginitis.  Prior to that just before the Christmas holiday, she underwent a course of IM dexamethasone at 10 mg.  She is actually undergone many steroid courses in the past year.  Has also undergone multiple antibiotic courses.  No current facility-administered medications for this encounter.  Current Outpatient Medications:    acyclovir cream (ZOVIRAX) 5 %, Apply 1 Application topically 5 (five) times daily., Disp: 5 g, Rfl: 0   amitriptyline (ELAVIL) 25 MG tablet, Take 2 tablets (50 mg total) by mouth at bedtime., Disp: 60 tablet, Rfl: 2   amoxicillin (AMOXIL) 875 MG tablet, Take 1 tablet (875 mg total) by mouth 2 (two) times daily., Disp: 14 tablet, Rfl: 0   Cholecalciferol (VITAMIN D3) 125 MCG (5000 UT) CAPS, Take 1 capsule by mouth daily., Disp: , Rfl:    ciprofloxacin (CIPRO) 500 MG tablet, Take 1 tablet (500 mg total) by mouth 2 (two) times  daily., Disp: 14 tablet, Rfl: 0   clindamycin (CLEOCIN T) 1 % lotion, Apply to cleansed face daily in the morning., Disp: 60 mL, Rfl: 1   famotidine (PEPCID) 40 MG tablet, Take 1 tablet (40 mg total) by mouth at bedtime., Disp: 90 tablet, Rfl: 0   Ferrous Sulfate (IRON) 28 MG TABS, Take 1 tablet by mouth daily., Disp: , Rfl:    fluconazole (DIFLUCAN) 150 MG tablet, Take 1 tablet (150 mg total) by mouth every 3 (three) days., Disp: 3 tablet, Rfl: 0   fluticasone (FLONASE) 50 MCG/ACT nasal spray, Place 1 spray into both nostrils daily. Begin by using 2 sprays in each nare daily for 3 to 5 days, then decrease to 1 spray in each nare daily., Disp: 15.8 mL, Rfl: 2   ipratropium (ATROVENT) 0.06 % nasal spray, Place 2 sprays into both nostrils 3 (three) times daily. As needed for nasal congestion, runny nose, Disp: 15 mL, Rfl: 2   levocetirizine (XYZAL) 5 MG tablet, Take 1 tablet (5 mg total) by mouth every evening., Disp: 90 tablet, Rfl: 0   montelukast (SINGULAIR) 10 MG tablet, Take 1 tablet (10 mg total) by mouth at bedtime., Disp: 90 tablet, Rfl: 0   ondansetron (ZOFRAN-ODT) 4 MG disintegrating tablet, Take 1 tablet (4 mg total) by mouth every 8 (eight) hours as needed for nausea or vomiting., Disp: 20 tablet, Rfl: 0   pantoprazole (PROTONIX) 40 MG tablet, Take 1 tablet (40 mg total) by mouth daily., Disp: 30 tablet,  Rfl: 6   polyethylene glycol powder (GLYCOLAX/MIRALAX) 17 GM/SCOOP powder, Take 17 g by mouth daily as needed for mild constipation., Disp: 238 g, Rfl: 0   predniSONE (DELTASONE) 20 MG tablet, Take 2 tablets daily with breakfast., Disp: 10 tablet, Rfl: 0   pseudoephedrine (SUDAFED) 60 MG tablet, Take 1 tablet (60 mg total) by mouth every 8 (eight) hours as needed for congestion., Disp: 30 tablet, Rfl: 0   SUMAtriptan (IMITREX) 25 MG tablet, Take 25 mg (1 tablet total) by mouth at the start of the headache. May repeat in 2 hours x 1 if headache persists. Max of 2 tablets/24 hours., Disp: 30  tablet, Rfl: 1   tretinoin (RETIN-A) 0.025 % cream, Apply pea size amount to cleansed face every other day at bedtime. 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    Urinary tract infection    Uterine fibroid      Past Surgical History:  Procedure Laterality Date   CESAREAN SECTION N/A 06/05/2014   Procedure: CESAREAN SECTION;  Surgeon: Truett Mainland, DO;  Location: [facility];  Service: Obstetrics;  Laterality: N/A;   WISDOM TOOTH EXTRACTION      Family History  Problem Relation Age of Onset   Hypertension Mother    Hypertension Father    Breast cancer Maternal Grandmother        56   Colon cancer Neg Hx    Colon polyps Neg Hx    Kidney disease Neg Hx    Diabetes Neg Hx    Esophageal cancer Neg Hx    Gallbladder disease Neg Hx    Heart disease Neg Hx    Asthma Neg Hx    Stroke Neg Hx    Stomach cancer Neg Hx     Social History   Tobacco Use   Smoking status: Never   Smokeless tobacco: Never  Vaping Use   Vaping Use: Never used  Substance Use Topics   Alcohol use: No   Drug use: No    ROS   Objective:   Vitals: BP 131/86 (BP Location: Left Arm)   Pulse 88   Temp 99 F (37.2 C) (Oral)   Resp 16   LMP 02/23/2022 (Approximate)   SpO2 97%   Physical Exam Constitutional:      General: She is not in acute distress.    Appearance: Normal appearance. She is  well-developed and normal weight. She is not ill-appearing, toxic-appearing or diaphoretic.  HENT:     Head: Normocephalic and atraumatic.     Right Ear: Tympanic membrane, ear canal and external ear normal. No drainage or tenderness. No middle ear effusion. There is no impacted cerumen. Tympanic membrane is not erythematous or bulging.     Left Ear: Tympanic membrane, ear canal and external ear normal. No drainage  or tenderness.  No middle ear effusion. There is no impacted cerumen. Tympanic membrane is not erythematous or bulging.     Nose: Congestion present. No rhinorrhea.     Mouth/Throat:     Mouth: Mucous membranes are moist. No oral lesions.     Pharynx: No pharyngeal swelling, oropharyngeal exudate, posterior oropharyngeal erythema or uvula swelling.     Tonsils: No tonsillar exudate or tonsillar abscesses.  Eyes:     General: No scleral icterus.       Right eye: No discharge.        Left eye: No discharge.     Extraocular Movements: Extraocular movements intact.     Right eye: Normal extraocular motion.     Left eye: Normal extraocular motion.     Conjunctiva/sclera: Conjunctivae normal.  Cardiovascular:     Rate and Rhythm: Normal rate and regular rhythm.     Heart sounds: Normal heart sounds. No murmur heard.    No friction rub. No gallop.  Pulmonary:     Effort: Pulmonary effort is normal. No respiratory distress.     Breath sounds: No stridor. No wheezing, rhonchi or rales.  Chest:     Chest wall: No tenderness.  Musculoskeletal:     Cervical back: Normal range of motion and neck supple.  Lymphadenopathy:     Cervical: No cervical adenopathy.  Skin:    General: Skin is warm and dry.  Neurological:     General: No focal deficit present.     Mental Status: She is alert and oriented to person, place, and time.  Psychiatric:        Mood and Affect: Mood normal.        Behavior: Behavior normal.    Results for orders placed or performed during the hospital encounter  of 03/08/22 (from the past 24 hour(s))  POCT urine pregnancy     Status: None   Collection Time: 03/08/22  8:39 AM  Result Value Ref Range   Preg Test, Ur Negative Negative  POCT urinalysis dipstick     Status: None   Collection Time: 03/08/22  8:39 AM  Result Value Ref Range   Color, UA yellow yellow   Clarity, UA clear clear   Glucose, UA negative negative mg/dL   Bilirubin, UA negative negative   Ketones, POC UA negative negative mg/dL   Spec Grav, UA 1.020 1.010 - 1.025   Blood, UA negative negative   pH, UA 6.5 5.0 - 8.0   Protein Ur, POC negative negative mg/dL   Urobilinogen, UA 0.2 0.2 or 1.0 E.U./dL   Nitrite, UA Negative Negative   Leukocytes, UA Negative Negative   DG Chest 2 View  Result Date: 03/08/2022 CLINICAL DATA:  Cough.  Chest congestion. EXAM: CHEST - 2 VIEW COMPARISON:  April 30, 2020 FINDINGS: Headache. IMPRESSION: No active cardiopulmonary disease. Electronically Signed   By: Dorise Bullion III M.D.   On: 03/08/2022 09:09    Assessment and Plan :   PDMP not reviewed this encounter.  1. Chronic rhinitis   2. Prediabetes   3. Urinary frequency     Had an extensive discussion with patient about antibiotic overuse, frequent steroid use.  Deferred repeat COVID testing given hemodynamically stable vital signs.  Chest x-ray negative.  Urine culture pending.  Patient requested a COVID test which I believe would be of low yield.  She does not need Paxlovid treatment should she test positive as her symptoms are well beyond the 5 days worth recommended.  Will have her switch to Allegra, refilled her Flonase and pseudoephedrine.  Follow-up with PCP and allergist.  Counseled patient on potential for adverse effects with medications prescribed/recommended today, ER and return-to-clinic precautions discussed, patient verbalized understanding.    Jaynee Eagles, [place] 03/08/22 [phone]

## 2022-03-08 NOTE — ED Triage Notes (Signed)
Pt c/o cough, head/chest congestion, HA, body aches x 7 days-also c/o urinary freq x 7 days-NAD-steady gait

## 2022-03-09 LAB — SARS CORONAVIRUS 2 (TAT 6-24 HRS): SARS Coronavirus 2: NEGATIVE

## 2022-03-10 LAB — URINE CULTURE: Culture: 40000 — AB

## 2022-03-11 ENCOUNTER — Telehealth (HOSPITAL_COMMUNITY): Payer: Self-pay | Admitting: Emergency Medicine

## 2022-03-11 MED ORDER — FOSFOMYCIN TROMETHAMINE 3 G PO PACK: 3.0000 g | PACK | Freq: Once | ORAL | 0 refills | Status: AC

## 2022-03-17 ENCOUNTER — Ambulatory Visit: Payer: Self-pay | Admitting: Family

## 2022-03-20 ENCOUNTER — Ambulatory Visit
Admission: EM | Admit: 2022-03-20 | Discharge: 2022-03-20 | Disposition: A | Discharge status: Home / Self Care | Payer: Medicaid Other | Attending: Urgent Care | Admitting: Urgent Care

## 2022-03-20 DIAGNOSIS — R110 Nausea: Secondary | ICD-10-CM | POA: Diagnosis present

## 2022-03-20 DIAGNOSIS — Z1152 Encounter for screening for COVID-19: Secondary | ICD-10-CM | POA: Diagnosis not present

## 2022-03-20 DIAGNOSIS — R52 Pain, unspecified: Secondary | ICD-10-CM

## 2022-03-20 DIAGNOSIS — R7303 Prediabetes: Secondary | ICD-10-CM | POA: Insufficient documentation

## 2022-03-20 DIAGNOSIS — R5381 Other malaise: Secondary | ICD-10-CM

## 2022-03-20 DIAGNOSIS — R519 Headache, unspecified: Secondary | ICD-10-CM | POA: Diagnosis present

## 2022-03-20 DIAGNOSIS — R5383 Other fatigue: Secondary | ICD-10-CM | POA: Diagnosis not present

## 2022-03-20 LAB — POCT URINE PREGNANCY: Preg Test, Ur: NEGATIVE

## 2022-03-20 MED ORDER — KETOROLAC TROMETHAMINE 30 MG/ML IJ SOLN
30.0000 mg | Freq: Once | INTRAMUSCULAR | Status: AC
  Administered 2022-03-20: 30 mg via INTRAMUSCULAR

## 2022-03-20 MED ORDER — PROMETHAZINE HCL 25 MG PO TABS: 25.0000 mg | ORAL_TABLET | Freq: Four times a day (QID) | ORAL | 0 refills | Status: DC | PRN

## 2022-03-20 NOTE — ED Triage Notes (Signed)
Pt c/o HA and nausea x 1 week-last dose tylenol 2 days ago-no other pain meds-NAD-steady gait

## 2022-03-20 NOTE — Discharge Instructions (Addendum)
We will notify you of your test results as they arrive and may take between about 24 hours.  I encourage you to sign up for MyChart if you have not already done so as this can be the easiest way for [place] to communicate results to you online or through a phone app.  Generally, we only contact you if it is a positive test result.  In the meantime, if you develop worsening symptoms including fever, chest pain, shortness of breath despite our current treatment plan then please report to the emergency room as this may be a sign of worsening status from possible viral infection.  Otherwise, we will manage this as a viral syndrome. For sore throat or cough try using a honey-based tea. Use 3 teaspoons of honey with juice squeezed from half lemon. Place shaved pieces of ginger into 1/2-1 cup of water and warm over stove top. Then mix the ingredients and repeat every 4 hours as needed. Please take Tylenol '500mg'$ -'650mg'$  every 6 hours for aches and pains, fevers. Hydrate very well with at least 2 liters of water. Eat light meals such as soups to replenish electrolytes and soft fruits, veggies. Start an antihistamine like Zyrtec or Allegra for postnasal drainage, sinus congestion.  You can take this together with pseudoephedrine (Sudafed) at a dose of 60 mg 2-3 times a day as needed for the same kind of congestion.  Use the cough medications as needed. Use the nausea medication as needed.

## 2022-03-20 NOTE — ED Provider Notes (Signed)
[name]  Note:  This document was prepared using Systems analyst and may include unintentional dictation errors.  MRN: [id number] DOB: 01/28/1986  Subjective:   Sherri [name] is a 37 y.o. female presenting for 1 week history of persistent intermittent frontal headaches, upset stomach, nausea without vomiting, loose stools, body aches, malaise and fatigue.  Has used Zofran for her current symptoms but is not helping.   Has allergic rhinitis, takes her medications consistently.  No chest pain, shortness of breath or wheezing, confusion, vision changes, numbness or tingling, weakness.  No history of cerebrovascular disease.  She does have a history of fibromyalgia.  She also also a prediabetic.  Last A1c was checked at the end of December 2023, was less than 6%.     No current facility-administered medications for this encounter.  Current Outpatient Medications:    acyclovir cream (ZOVIRAX) 5 %, Apply 1 Application topically 5 (five) times daily., Disp: 5 g, Rfl: 0   amitriptyline (ELAVIL) 25 MG tablet, Take 2 tablets (50 mg total) by mouth at bedtime., Disp: 60 tablet, Rfl: 2   Cholecalciferol (VITAMIN D3) 125 MCG (5000 UT) CAPS, Take 1 capsule by mouth daily., Disp: , Rfl:    ciprofloxacin (CIPRO) 500 MG tablet, Take 1 tablet (500 mg total) by mouth 2 (two) times daily., Disp: 14 tablet, Rfl: 0   clindamycin (CLEOCIN T) 1 % lotion, Apply to cleansed face daily in the morning., Disp: 60 mL, Rfl: 1   famotidine (PEPCID) 40 MG tablet, Take 1 tablet (40 mg total) by mouth at bedtime., Disp: 90 tablet, Rfl: 0   Ferrous Sulfate (IRON) 28 MG TABS, Take 1 tablet by mouth daily., Disp: , Rfl:    fexofenadine (ALLEGRA) 180 MG tablet, Take 1 tablet (180 mg total) by mouth daily., Disp: 90 tablet, Rfl: 3   fluconazole (DIFLUCAN) 150 MG tablet, Take 1 tablet (150 mg total) by mouth every 3 (three) days., Disp: 3 tablet, Rfl: 0   fluticasone (FLONASE) 50  MCG/ACT nasal spray, Place 1 spray into both nostrils daily. Begin by using 2 sprays in each nare daily for 3 to 5 days, then decrease to 1 spray in each nare daily., Disp: 18 mL, Rfl: 5   ipratropium (ATROVENT) 0.06 % nasal spray, Place 2 sprays into both nostrils 3 (three) times daily. As needed for nasal congestion, runny nose, Disp: 15 mL, Rfl: 2   montelukast (SINGULAIR) 10 MG tablet, Take 1 tablet (10 mg total) by mouth at bedtime., Disp: 90 tablet, Rfl: 0   ondansetron (ZOFRAN-ODT) 4 MG disintegrating tablet, Take 1 tablet (4 mg total) by mouth every 8 (eight) hours as needed for nausea or vomiting., Disp: 20 tablet, Rfl: 0   pantoprazole (PROTONIX) 40 MG tablet, Take 1 tablet (40 mg total) by mouth daily., Disp: 30 tablet, Rfl: 6   polyethylene glycol powder (GLYCOLAX/MIRALAX) 17 GM/SCOOP powder, Take 17 g by mouth daily as needed for mild constipation., Disp: 238 g, Rfl: 0   predniSONE (DELTASONE) 20 MG tablet, Take 2 tablets daily with breakfast., Disp: 10 tablet, Rfl: 0   pseudoephedrine (SUDAFED) 60 MG tablet, Take 1 tablet (60 mg total) by mouth every 8 (eight) hours as needed for congestion., Disp: 30 tablet, Rfl: 0   SUMAtriptan (IMITREX) 25 MG tablet, Take 25 mg (1 tablet total) by mouth at the start of the headache. May repeat in 2 hours x 1 if headache persists. Max of 2 tablets/24 hours., Disp: 30  tablet, Rfl: 1   tretinoin (RETIN-A) 0.025 % cream, Apply pea size amount to cleansed face every other day at bedtime. Increase to every night as tolerated., Disp: 20 g, Rfl: 0   triamcinolone cream (KENALOG) 0.1 %, Apply to affected areas twice daily for 3 days on and 3 days off cycle or as needed., Disp: 80 g, Rfl: 0   Allergies  Allergen Reactions   Macrobid [Nitrofurantoin] Nausea And Vomiting   Augmentin [Amoxicillin-Pot Clavulanate]     Pt states it makes her sick   Cefdinir Itching   Metformin And Related Nausea And Vomiting    Past Medical History:  Diagnosis Date    Diabetes mellitus without complication (HCC)    Fibromyalgia    denies today   Genital herpes    Hypertension    gestational   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    Urinary tract infection    Uterine fibroid      Past Surgical History:  Procedure Laterality Date   CESAREAN SECTION N/A 06/05/2014   Procedure: CESAREAN SECTION;  Surgeon: Truett Mainland, DO;  Location: [facility];  Service: Obstetrics;  Laterality: N/A;   WISDOM TOOTH EXTRACTION      Family History  Problem Relation Age of Onset   Hypertension Mother    Hypertension Father    Breast cancer Maternal Grandmother        23   Colon cancer Neg Hx    Colon polyps Neg Hx    Kidney disease Neg Hx    Diabetes Neg Hx    Esophageal cancer Neg Hx    Gallbladder disease Neg Hx    Heart disease Neg Hx    Asthma Neg Hx    Stroke Neg Hx    Stomach cancer Neg Hx     Social History   Tobacco Use   Smoking status: Never   Smokeless tobacco: Never  Vaping Use   Vaping Use: Never used  Substance Use Topics   Alcohol use: No   Drug use: No    ROS   Objective:   Vitals: BP 133/84 (BP Location: Left Arm)   Pulse 75   Temp 99.2 F (37.3 C) (Oral)   Resp 16   LMP 02/23/2022 (Approximate)   SpO2 97%   Physical Exam Constitutional:      General: She is not in acute distress.    Appearance: Normal appearance. She is well-developed. She is not ill-appearing, toxic-appearing or diaphoretic.  HENT:     Head: Normocephalic and atraumatic.     Nose: Nose normal.     Mouth/Throat:     Mouth: Mucous membranes are moist.     Pharynx: Oropharynx is clear.  Eyes:     General: No scleral icterus.       Right eye: No discharge.        Left eye: No discharge.     Extraocular Movements: Extraocular movements intact.     Conjunctiva/sclera: Conjunctivae normal.  Neck:     Meningeal: Brudzinski's sign and Kernig's sign absent.  Cardiovascular:     Rate and Rhythm: Normal rate and regular rhythm.      Heart sounds: Normal heart sounds. No murmur heard.    No friction rub. No gallop.  Pulmonary:     Effort: Pulmonary effort is normal. No respiratory distress.     Breath sounds: No stridor. No wheezing, rhonchi or rales.  Chest:     Chest wall: No tenderness.  Abdominal:  General: Bowel sounds are normal. There is no distension.     Palpations: Abdomen is soft. There is no mass.     Tenderness: There is no abdominal tenderness. There is no right CVA tenderness, left CVA tenderness, guarding or rebound.  Skin:    General: Skin is warm and dry.  Neurological:     General: No focal deficit present.     Mental Status: She is alert and oriented to person, place, and time.     Cranial Nerves: No cranial nerve deficit, dysarthria or facial asymmetry.     Motor: No weakness or pronator drift.     Coordination: Romberg sign negative. Coordination normal. Finger-Nose-Finger Test and Heel to [facility] Test normal. Rapid alternating movements normal.     Gait: Gait and tandem walk normal.     Deep Tendon Reflexes: Reflexes normal.  Psychiatric:        Mood and Affect: Mood normal.        Behavior: Behavior normal.        Thought Content: Thought content normal.        Judgment: Judgment normal.    IM Toradol in clinic at 30 mg.  Results for orders placed or performed during the hospital encounter of 03/20/22 (from the past 24 hour(s))  POCT urine pregnancy     Status: None   Collection Time: 03/20/22  9:11 AM  Result Value Ref Range   Preg Test, Ur Negative Negative    Assessment and Plan :   PDMP not reviewed this encounter.  1. Body aches   2. Generalized headache   3. Nausea without vomiting   4. Malaise and fatigue   5. Pre-diabetes     Recommend conservative management, supportive care for what may very well be an acute viral syndrome.  COVID testing pending.  Patient has had extensive workup done in the past 1 to 2 months and therefore will defer repeat testing. Counseled  patient on potential for adverse effects with medications prescribed/recommended today, ER and return-to-clinic precautions discussed, patient verbalized understanding.    Jaynee Eagles, [place] 03/20/22 [phone]

## 2022-03-21 LAB — SARS CORONAVIRUS 2 (TAT 6-24 HRS): SARS Coronavirus 2: NEGATIVE

## 2022-03-25 ENCOUNTER — Other Ambulatory Visit: Payer: Self-pay

## 2022-03-26 ENCOUNTER — Encounter: Payer: Self-pay | Admitting: Allergy

## 2022-03-26 ENCOUNTER — Other Ambulatory Visit: Payer: Self-pay

## 2022-03-26 ENCOUNTER — Ambulatory Visit (INDEPENDENT_AMBULATORY_CARE_PROVIDER_SITE_OTHER): Payer: Medicaid Other | Admitting: Allergy

## 2022-03-26 VITALS — BP 130/70 | HR 90 | Temp 97.9°F | Resp 16 | Ht 58.47 in | Wt 182.0 lb

## 2022-03-26 DIAGNOSIS — J3089 Other allergic rhinitis: Secondary | ICD-10-CM

## 2022-03-26 DIAGNOSIS — H1013 Acute atopic conjunctivitis, bilateral: Secondary | ICD-10-CM

## 2022-03-26 NOTE — Progress Notes (Signed)
New Patient Note  RE: Sherri [name] MRN: [id number] DOB: 30-Dec-1985 Date of Office Visit: 03/26/2022   Primary care provider: Virgel Manifold, MD  Chief Complaint: allergies  History of present illness: Sherri [name] is a 37 y.o. female presenting today for evaluation of allergic rhinitis.    She reports symptoms of sneezing, coughing, runny nose, ears feeling like water is in them, itchy/watery eyes, nasal congestion, sinus pressure, throat clearing, throat and nasal itch.  Symptoms are year-round.  She feels like these symptoms started in her 58s.   She takes allegra daily for years and not sure if helpful.  She has tried zyrtec, claritin and xyzal and feels they all work the same which is not that much.  She also states she has tried ryvent/carbinoxamine as well without much improvement in symptoms.  She has flonase that she uses as needed and does find it helpful.  She has nasal atrovent that she uses as needed and helps as well for drainage.  She used to take singulair but no longer as didn't feel it was working.  She has used eye drops that doesn't seem to help that much either.  She has tried nasal saline rinse and doesn't tolerate performing.    No history of asthma, eczema or food allergy.   Review of systems: Review of Systems  Constitutional: Negative.   HENT:         See HPI  Eyes:        See HPI  Respiratory: Negative.    Cardiovascular: Negative.   Gastrointestinal: Negative.   Musculoskeletal: Negative.   Skin: Negative.   Allergic/Immunologic: Negative.   Neurological: Negative.     All other systems negative unless noted above in HPI  Past medical history: Past Medical History:  Diagnosis Date   Diabetes mellitus without complication ([place])    Fibromyalgia    denies today   Genital herpes    Hypertension    gestational   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    Urinary tract infection    Uterine fibroid     Past surgical  history: Past Surgical History:  Procedure Laterality Date   CESAREAN SECTION N/A 06/05/2014   Procedure: CESAREAN SECTION;  Surgeon: Truett Mainland, DO;  Location: [facility];  Service: Obstetrics;  Laterality: N/A;   WISDOM TOOTH EXTRACTION      Family history:  Family History  Problem Relation Age of Onset   Hypertension Mother    Hypertension Father    Breast cancer Maternal Grandmother        27   Colon cancer Neg Hx    Colon polyps Neg Hx    Kidney disease Neg Hx    Diabetes Neg Hx    Esophageal cancer Neg Hx    Gallbladder disease Neg Hx    Heart disease Neg Hx    Asthma Neg Hx    Stroke Neg Hx    Stomach cancer Neg Hx     Social history: Lives in an apartment without carpeting with electric heating and central cooling.  No pets in the home.  Cats outside the home.  There is no concern for water damage, mildew or roaches in the home.  She works at [employer].  She has no smoking history.   Medication List: Current Outpatient Medications  Medication Sig Dispense Refill   acyclovir cream (ZOVIRAX) 5 % Apply 1 Application topically 5 (five) times daily. 5 g 0   amitriptyline (ELAVIL)  25 MG tablet Take 2 tablets (50 mg total) by mouth at bedtime. 60 tablet 2   Cholecalciferol (VITAMIN D3) 125 MCG (5000 UT) CAPS Take 1 capsule by mouth daily.     ciprofloxacin (CIPRO) 500 MG tablet Take 1 tablet (500 mg total) by mouth 2 (two) times daily. 14 tablet 0   clindamycin (CLEOCIN T) 1 % lotion Apply to cleansed face daily in the morning. 60 mL 1   famotidine (PEPCID) 40 MG tablet Take 1 tablet (40 mg total) by mouth at bedtime. 90 tablet 0   Ferrous Sulfate (IRON) 28 MG TABS Take 1 tablet by mouth daily.     fexofenadine (ALLEGRA) 180 MG tablet Take 1 tablet (180 mg total) by mouth daily. 90 tablet 3   fluconazole (DIFLUCAN) 150 MG tablet Take 1 tablet (150 mg total) by mouth every 3 (three) days. 3 tablet 0   fluticasone (FLONASE) 50 MCG/ACT nasal spray Place 1 spray into both  nostrils daily. Begin by using 2 sprays in each nare daily for 3 to 5 days, then decrease to 1 spray in each nare daily. 18 mL 5   ipratropium (ATROVENT) 0.06 % nasal spray Place 2 sprays into both nostrils 3 (three) times daily. As needed for nasal congestion, runny nose 15 mL 2   montelukast (SINGULAIR) 10 MG tablet Take 1 tablet (10 mg total) by mouth at bedtime. 90 tablet 0   ondansetron (ZOFRAN-ODT) 4 MG disintegrating tablet Take 1 tablet (4 mg total) by mouth every 8 (eight) hours as needed for nausea or vomiting. 20 tablet 0   pantoprazole (PROTONIX) 40 MG tablet Take 1 tablet (40 mg total) by mouth daily. 30 tablet 6   polyethylene glycol powder (GLYCOLAX/MIRALAX) 17 GM/SCOOP powder Take 17 g by mouth daily as needed for mild constipation. 238 g 0   promethazine (PHENERGAN) 25 MG tablet Take 1 tablet (25 mg total) by mouth every 6 (six) hours as needed for nausea or vomiting. 30 tablet 0   pseudoephedrine (SUDAFED) 60 MG tablet Take 1 tablet (60 mg total) by mouth every 8 (eight) hours as needed for congestion. 30 tablet 0   SUMAtriptan (IMITREX) 25 MG tablet Take 25 mg (1 tablet total) by mouth at the start of the headache. May repeat in 2 hours x 1 if headache persists. Max of 2 tablets/24 hours. 30 tablet 1   tretinoin (RETIN-A) 0.025 % cream Apply pea size amount to cleansed face every other day at bedtime. Increase to every night as tolerated. 20 g 0   triamcinolone cream (KENALOG) 0.1 % Apply to affected areas twice daily for 3 days on and 3 days off cycle or as needed. 80 g 0   predniSONE (DELTASONE) 20 MG tablet Take 2 tablets daily with breakfast. (Patient not taking: Reported on 03/26/2022) 10 tablet 0   No current facility-administered medications for this visit.    Known medication allergies: Allergies  Allergen Reactions   Macrobid [Nitrofurantoin] Nausea And Vomiting   Augmentin [Amoxicillin-Pot Clavulanate]     Pt states it makes her sick   Cefdinir Itching   Metformin  And Related Nausea And Vomiting     Physical examination: Blood pressure 130/70, pulse 90, temperature 97.9 F (36.6 C), temperature source Temporal, resp. rate 16, height 4' 10.47" (1.485 m), weight 182 lb (82.6 kg), last menstrual period 02/23/2022, SpO2 97 %.  General: Alert, interactive, in no acute distress. HEENT: PERRLA, TMs pearly gray, turbinates moderately edematous without discharge, post-pharynx non erythematous. Neck: Supple  without lymphadenopathy. Lungs: Clear to auscultation without wheezing, rhonchi or rales. {no increased work of breathing. CV: Normal S1, S2 without murmurs. Abdomen: Nondistended, nontender. Skin: Warm and dry, without lesions or rashes. Extremities:  No clubbing, cyanosis or edema. Neuro:   Grossly intact.  Diagnositics/Labs:  Allergy testing:   Airborne Adult Perc - 03/26/22 1016     Time Antigen Placed 1021    Allergen Manufacturer Lavella Hammock    Location Back    Number of Test 59    1. Control-Buffer 50% Glycerol Negative    2. Control-Histamine 1 mg/ml --   +/-   3. Albumin saline Negative    4. [place] Negative    5. [place] Negative    6. Johnson Negative    7. [place] Blue Negative    8. [place] Omitted    9. Perennial Rye Negative    10. Sweet Vernal 2+    11. Timothy Negative    12. Cocklebur Negative    13. Burweed Marshelder Negative    14. Ragweed, short Negative    15. Ragweed, Giant Negative    16. Plantain,  English Negative    17. Lamb's Quarters Negative    18. Sheep Sorrell Negative    19. Rough Pigweed Negative    20. Marsh Elder, Rough Negative    21. Mugwort, Common Negative    22. Ash mix Negative    23. Birch mix Negative    24. Beech American Negative    25. Box, Elder Negative    26. Cedar, red Negative    27. Cottonwood, [place] Negative    28. Elm mix Negative    29. Hickory Negative    30. Maple mix Negative    31. Oak, [place] mix Negative    32. Pecan Pollen Negative    33. Pine mix Negative     34. Sycamore Eastern Negative    35. Putnam, Black Pollen Negative    36. Alternaria alternata Negative    37. Cladosporium Herbarum Negative    38. Aspergillus mix Negative    39. Penicillium mix Negative    40. Bipolaris sorokiniana (Helminthosporium) Negative    41. Drechslera spicifera (Curvularia) Negative    42. Mucor plumbeus Negative    43. Fusarium moniliforme Negative    44. Aureobasidium pullulans (pullulara) Negative    45. Rhizopus oryzae Negative    46. Botrytis cinera Negative    47. Epicoccum nigrum Negative    48. Phoma betae Negative    49. Candida Albicans Negative    50. Trichophyton mentagrophytes Negative    51. Mite, D Farinae  5,000 AU/ml Negative    52. Mite, D Pteronyssinus  5,000 AU/ml Negative    53. Cat Hair 10,000 BAU/ml Negative    54.  Dog Epithelia Negative    55. Mixed Feathers Negative    56. Horse Epithelia Negative    57. Cockroach, German Negative    58. Mouse Negative    59. Tobacco Leaf Negative             Allergy testing results were read and interpreted by provider, documented by clinical staff.   Assessment and plan:  Allergic rhinitis with conjunctivitis  - Testing today showed: grasses.   Your histamine (positive control) had a weak response thus will also obtain environmental allergy panel via bloodwork to ensure we capture all you are allergic too - Avoidance measures provided. - Continue with: Flonase (fluticasone) two sprays per nostril daily (AIM FOR EAR ON  EACH SIDE)- continue for 1-2 weeks at a time before stopping once nasal congestion improves for maximum benefit.  Atrovent (ipratropium) 0.06% 1-2 spray per nostril 3-4 times daily as needed for nasal drainage control.  (CAN BE OVER DRYING) - You have not had good results with the antihistamines or eye drop medications.  - Recommend allergy shots as a means of long-term control. - Allergy shots "re-train" and "reset" the immune system to ignore environmental  allergens and decrease the resulting immune response to those allergens (sneezing, itchy watery eyes, runny nose, nasal congestion, etc).    - Allergy shots improve symptoms in 80-85% of patients.    Follow-up in 3-4 months or sooner if needed  I appreciate the opportunity to take part in Jianni's care. Please do not hesitate to contact me with questions.  Sincerely,   Prudy Feeler, MD Allergy/Immunology [facility]

## 2022-03-26 NOTE — Patient Instructions (Signed)
-  Testing today showed: grasses.   Your histamine (positive control) had a weak response thus will also obtain environmental allergy panel via bloodwork to ensure we capture all you are allergic too - Avoidance measures provided. - Continue with: Flonase (fluticasone) two sprays per nostril daily (AIM FOR EAR ON EACH SIDE)- continue for 1-2 weeks at a time before stopping once nasal congestion improves for maximum benefit.  Atrovent (ipratropium) 0.06% 1-2 spray per nostril 3-4 times daily as needed for nasal drainage control.  (CAN BE OVER DRYING) - You have not had good results with the antihistamines or eye drop medications.  - Recommend allergy shots as a means of long-term control. - Allergy shots "re-train" and "reset" the immune system to ignore environmental allergens and decrease the resulting immune response to those allergens (sneezing, itchy watery eyes, runny nose, nasal congestion, etc).    - Allergy shots improve symptoms in 80-85% of patients.    Follow-up in 3-4 months or sooner if needed

## 2022-03-30 LAB — RESPIRATORY ALLERGY PROFILE REGION II ~~LOC~~
Allergen, A. alternata, m6: 1.35 kU/L — ABNORMAL HIGH
Allergen, Cedar tree, t12: <0.1 kU/L
Allergen, Comm Silver Birch, t9: <0.1 kU/L
Allergen, Cottonwood, t14: <0.1 kU/L
Allergen, D pternoyssinus,d7: 0.23 kU/L — ABNORMAL HIGH
Allergen, Mouse Urine Protein, e78: <0.1 kU/L
Allergen, Mulberry, t76: <0.1 kU/L
Allergen, Oak,t7: <0.1 kU/L
Allergen, P. notatum, m1: <0.1 kU/L
Aspergillus fumigatus, m3: <0.1 kU/L
Bermuda Grass: <0.1 kU/L
Box Elder IgE: <0.1 kU/L
CLADOSPORIUM HERBARUM (M2) IGE: <0.1 kU/L
COMMON RAGWEED (SHORT) (W1) IGE: <0.1 kU/L
Cat Dander: <0.1 kU/L
Class: 0
Class: 0
Class: 0
Class: 0
Class: 0
Class: 0
Class: 0
Class: 0
Class: 0
Class: 0
Class: 0
Class: 0
Class: 0
Class: 0
Class: 0
Class: 0
Class: 0
Class: 0
Class: 0
Class: 0
Class: 2
Cockroach: <0.1 kU/L
D. farinae: 0.26 kU/L — ABNORMAL HIGH
Dog Dander: <0.1 kU/L
Elm IgE: <0.1 kU/L
IgE (Immunoglobulin E), Serum: 10 kU/L (ref <=114)
Johnson Grass: <0.1 kU/L
Pecan/Hickory Tree IgE: <0.1 kU/L
Rough Pigweed  IgE: <0.1 kU/L
Sheep Sorrel IgE: <0.1 kU/L
Timothy Grass: 0.15 kU/L — ABNORMAL HIGH

## 2022-03-30 LAB — INTERPRETATION:

## 2022-04-01 LAB — HM PAP SMEAR

## 2022-04-06 ENCOUNTER — Telehealth: Payer: Self-pay | Admitting: Gastroenterology

## 2022-04-06 ENCOUNTER — Encounter: Payer: Medicaid Other | Admitting: Gastroenterology

## 2022-04-06 NOTE — Telephone Encounter (Signed)
-----   Message from Ladene Artist, MD sent at 04/06/2022  8:26 AM EST ----- Sherri [name], This patient just cancelled her [place] colon/egd for today. She did the same in December. The phone note says she was rescheduled in the [facility] [place] for 2/14. Please contact her to schedule an office appt with me or PG and cancel her [place] appt. She needs to be seen in the office to re-discuss procedures and cancellations. MS

## 2022-04-06 NOTE — Telephone Encounter (Signed)
Patient called to reschedule procedure from today at 11:00 AM. Patient rescheduled to 2/14 at 3:00 PM.

## 2022-04-09 ENCOUNTER — Encounter: Payer: Self-pay | Admitting: Gastroenterology

## 2022-04-09 ENCOUNTER — Ambulatory Visit (INDEPENDENT_AMBULATORY_CARE_PROVIDER_SITE_OTHER): Payer: Medicaid Other | Admitting: Gastroenterology

## 2022-04-09 VITALS — BP 124/86 | HR 101 | Ht <= 58 in | Wt 186.0 lb

## 2022-04-09 DIAGNOSIS — R109 Unspecified abdominal pain: Secondary | ICD-10-CM

## 2022-04-09 DIAGNOSIS — G8929 Other chronic pain: Secondary | ICD-10-CM

## 2022-04-09 DIAGNOSIS — R1084 Generalized abdominal pain: Secondary | ICD-10-CM | POA: Diagnosis not present

## 2022-04-09 DIAGNOSIS — K5909 Other constipation: Secondary | ICD-10-CM

## 2022-04-09 MED ORDER — NA SULFATE-K SULFATE-MG SULF 17.5-3.13-1.6 GM/177ML PO SOLN: 1.0000 | Freq: Once | ORAL | 0 refills | Status: AC

## 2022-04-09 NOTE — Progress Notes (Addendum)
Assessment     Chronic generalized abdominal pain, chronic constipation, chronic nausea and vomiting with extensive imaging evaluation unremarkable. R/O GERD, esophagitis, ulcer, gastritis, colorectal neoplasms.  Iron deficiency anemia. R/O menstrual losses, AVMs and as outlined in #1   Recommendations    Continue pantoprazole, famotidine, ondansetron, amitriptyline 2.   Change Miralax to daily, not prn 3.   Reschedule colonoscopy and EGD. The risks (including bleeding, perforation, infection, missed lesions, medication reactions and possible hospitalization or surgery if complications occur), benefits, and alternatives to colonoscopy with possible biopsy and possible polypectomy were discussed with the patient and they consent to proceed.  The risks (including bleeding, perforation, infection, missed lesions, medication reactions and possible hospitalization or surgery if complications occur), benefits, and alternatives to endoscopy with possible biopsy and possible dilation were discussed with the patient and they consent to proceed.     HPI    This is a 37 year old female returning for generalized abdominal pain, chronic constipation, chronic intermittent nausea and vomiting and a history of iron deficiency anemia.  Refer to office evaluation with Tye Savoy, NP on Oct. 5, 2023.  Extensive imaging evaluation including abdominal ultrasound, 3 abdominal/pelvic CT scans, an UGI series and a barium enema over the past 3 years have been unrevealing.  She canceled the same day for scheduled colonoscopy and EGD on 12/18 and 2/5.  She relates this was due to a ride not being available.  She would like to proceed with colonoscopy and EGD and she related she has secured a reliable ride for her to proceed.  Her chronic symptoms are unchanged.  She takes MiraLAX as needed with a bowel movement every 1 or 2 days.  Currently her nausea and vomiting are less frequent.  Her generalized abdominal pain  persists.   Labs / Imaging       Latest Ref Rng & Units 09/27/2021   10:10 AM 01/21/2021    3:25 PM 09/05/2020    6:56 AM  Hepatic Function  Total Protein 6.5 - 8.1 g/dL 7.7  7.3  7.9   Albumin 3.5 - 5.0 g/dL 4.1  4.6  4.5   AST 15 - 41 U/L '16  12  15   '$ ALT 0 - 44 U/L '15  12  14   '$ Alk Phosphatase 38 - 126 U/L 69  97  64   Total Bilirubin 0.3 - 1.2 mg/dL 0.4  <0.2  0.5        Latest Ref Rng & Units 11/30/2021    7:35 AM 09/27/2021   10:10 AM 09/18/2021   11:08 AM  CBC  WBC 4.0 - 10.5 K/uL 6.5  5.9  6.6   Hemoglobin 12.0 - 15.0 g/dL 12.9  12.9  13.2   Hematocrit 36.0 - 46.0 % 39.5  39.5  40.2   Platelets 150 - 400 K/uL 231  260  279.0    Current Medications, Allergies, Past Medical History, Past Surgical History, Family History and Social History were reviewed in [employer] record.   Physical Exam: General: Well developed, well nourished, no acute distress Head: Normocephalic and atraumatic Eyes: Sclerae anicteric, EOMI Ears: Normal auditory acuity Mouth: No deformities or lesions noted Lungs: Clear throughout to auscultation Heart: Regular rate and rhythm; No murmurs, rubs or bruits Abdomen: Soft, minimal diffuse tenderness and non distended. No masses, hepatosplenomegaly or hernias noted. Normal Bowel sounds Rectal: Deferred to colonoscopy Musculoskeletal: Symmetrical with no gross deformities  Pulses:  Normal pulses noted Extremities: No  edema or deformities noted Neurological: Alert oriented x 4, grossly nonfocal Psychological:  Alert and cooperative. Normal mood and affect   Gabrielly Mccrystal T. Fuller Plan, MD 04/09/2022, 10:31 AM

## 2022-04-09 NOTE — Patient Instructions (Signed)
You have been scheduled for an endoscopy and colonoscopy. Please follow the written instructions given to you at your visit today. Please pick up your prep supplies at the pharmacy within the next 1-3 days. If you use inhalers (even only as needed), please bring them with you on the day of your procedure.  Take Miralax daily.   Due to recent changes in healthcare laws, you may see the results of your imaging and laboratory studies on MyChart before your provider has had a chance to review them.  We understand that in some cases there may be results that are confusing or concerning to you. Not all laboratory results come back in the same time frame and the provider may be waiting for multiple results in order to interpret others.  Please give [place] 48 hours in order for your provider to thoroughly review all the results before contacting the office for clarification of your results.   The [facility] providers would like to encourage you to use [facility] to communicate with providers for non-urgent requests or questions.  Due to long hold times on the telephone, sending your provider a message by [facility] may be a faster and more efficient way to get a response.  Please allow 48 business hours for a response.  Please remember that this is for non-urgent requests.   Thank you for choosing me and [facility].  Pricilla Riffle. Dagoberto Ligas., MD., Marval Regal

## 2022-04-09 NOTE — Addendum Note (Signed)
Addended by: Dorisann Frames L on: 04/09/2022 03:23 PM   Modules accepted: Orders

## 2022-04-12 ENCOUNTER — Encounter: Payer: Self-pay | Admitting: Certified Registered Nurse Anesthetist

## 2022-04-13 ENCOUNTER — Ambulatory Visit
Admission: EM | Admit: 2022-04-13 | Discharge: 2022-04-13 | Disposition: A | Discharge status: Home / Self Care | Payer: Medicaid Other | Attending: Urgent Care | Admitting: Urgent Care

## 2022-04-13 DIAGNOSIS — J329 Chronic sinusitis, unspecified: Secondary | ICD-10-CM

## 2022-04-13 DIAGNOSIS — J309 Allergic rhinitis, unspecified: Secondary | ICD-10-CM

## 2022-04-13 MED ORDER — AMOXICILLIN 875 MG PO TABS: 875.0000 mg | ORAL_TABLET | Freq: Two times a day (BID) | ORAL | 0 refills | Status: DC

## 2022-04-13 MED ORDER — PREDNISONE 20 MG PO TABS: ORAL_TABLET | ORAL | 0 refills | Status: DC

## 2022-04-13 NOTE — ED Provider Notes (Signed)
[name] URG[facility]  Note:  This document was prepared using Systems analyst and may include unintentional dictation errors.  MRN: [id number] DOB: 1985-12-02  Subjective:   Sherri [name] is a 37 y.o. female presenting for 1 week history of recurrent sinus congestion, drainage, throat pain. Has chronic allergic rhinitis, just saw an allergist. Is supposed to start allergy injections.  She is compliant with her allergy medications.  Has previously had difficulty tolerating antibiotics.  No chest pain, shortness of breath or wheezing.  Also saw GI last week, is supposed to undergo an endoscopy and colonoscopy.   No current facility-administered medications for this encounter.  Current Outpatient Medications:    acyclovir cream (ZOVIRAX) 5 %, Apply 1 Application topically 5 (five) times daily., Disp: 5 g, Rfl: 0   amitriptyline (ELAVIL) 25 MG tablet, Take 2 tablets (50 mg total) by mouth at bedtime., Disp: 60 tablet, Rfl: 2   Cholecalciferol (VITAMIN D3) 125 MCG (5000 UT) CAPS, Take 1 capsule by mouth daily., Disp: , Rfl:    ciprofloxacin (CIPRO) 500 MG tablet, Take 1 tablet (500 mg total) by mouth 2 (two) times daily., Disp: 14 tablet, Rfl: 0   clindamycin (CLEOCIN T) 1 % lotion, Apply to cleansed face daily in the morning., Disp: 60 mL, Rfl: 1   famotidine (PEPCID) 40 MG tablet, Take 1 tablet (40 mg total) by mouth at bedtime., Disp: 90 tablet, Rfl: 0   Ferrous Sulfate (IRON) 28 MG TABS, Take 1 tablet by mouth daily., Disp: , Rfl:    fexofenadine (ALLEGRA) 180 MG tablet, Take 1 tablet (180 mg total) by mouth daily., Disp: 90 tablet, Rfl: 3   fluconazole (DIFLUCAN) 150 MG tablet, Take 1 tablet (150 mg total) by mouth every 3 (three) days., Disp: 3 tablet, Rfl: 0   fluticasone (FLONASE) 50 MCG/ACT nasal spray, Place 1 spray into both nostrils daily. Begin by using 2 sprays in each nare daily for 3 to 5 days, then decrease to 1 spray in each nare daily., Disp:  18 mL, Rfl: 5   ipratropium (ATROVENT) 0.06 % nasal spray, Place 2 sprays into both nostrils 3 (three) times daily. As needed for nasal congestion, runny nose, Disp: 15 mL, Rfl: 2   montelukast (SINGULAIR) 10 MG tablet, Take 1 tablet (10 mg total) by mouth at bedtime., Disp: 90 tablet, Rfl: 0   ondansetron (ZOFRAN-ODT) 4 MG disintegrating tablet, Take 1 tablet (4 mg total) by mouth every 8 (eight) hours as needed for nausea or vomiting., Disp: 20 tablet, Rfl: 0   pantoprazole (PROTONIX) 40 MG tablet, Take 1 tablet (40 mg total) by mouth daily., Disp: 30 tablet, Rfl: 6   polyethylene glycol powder (GLYCOLAX/MIRALAX) 17 GM/SCOOP powder, Take 17 g by mouth daily as needed for mild constipation., Disp: 238 g, Rfl: 0   predniSONE (DELTASONE) 20 MG tablet, Take 2 tablets daily with breakfast., Disp: 10 tablet, Rfl: 0   promethazine (PHENERGAN) 25 MG tablet, Take 1 tablet (25 mg total) by mouth every 6 (six) hours as needed for nausea or vomiting., Disp: 30 tablet, Rfl: 0   pseudoephedrine (SUDAFED) 60 MG tablet, Take 1 tablet (60 mg total) by mouth every 8 (eight) hours as needed for congestion., Disp: 30 tablet, Rfl: 0   SUMAtriptan (IMITREX) 25 MG tablet, Take 25 mg (1 tablet total) by mouth at the start of the headache. May repeat in 2 hours x 1 if headache persists. Max of 2 tablets/24 hours., Disp: 30 tablet, Rfl:  1   tretinoin (RETIN-A) 0.025 % cream, Apply pea size amount to cleansed face every other day at bedtime. Increase to every night as tolerated., Disp: 20 g, Rfl: 0   triamcinolone cream (KENALOG) 0.1 %, Apply to affected areas twice daily for 3 days on and 3 days off cycle or as needed., Disp: 80 g, Rfl: 0   Allergies  Allergen Reactions   Macrobid [Nitrofurantoin] Nausea And Vomiting   Augmentin [Amoxicillin-Pot Clavulanate]     Pt states it makes her sick   Cefdinir Itching   Metformin And Related Nausea And Vomiting    Past Medical History:  Diagnosis Date   Diabetes mellitus  without complication (HCC)    Fibromyalgia    denies today   Genital herpes    Hypertension    gestational   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    Urinary tract infection    Uterine fibroid      Past Surgical History:  Procedure Laterality Date   CESAREAN SECTION N/A 06/05/2014   Procedure: CESAREAN SECTION;  Surgeon: Truett Mainland, DO;  Location: [facility];  Service: Obstetrics;  Laterality: N/A;   WISDOM TOOTH EXTRACTION      Family History  Problem Relation Age of Onset   Hypertension Mother    Hypertension Father    Breast cancer Maternal Grandmother        87   Colon cancer Neg Hx    Colon polyps Neg Hx    Kidney disease Neg Hx    Diabetes Neg Hx    Esophageal cancer Neg Hx    Gallbladder disease Neg Hx    Heart disease Neg Hx    Asthma Neg Hx    Stroke Neg Hx    Stomach cancer Neg Hx     Social History   Tobacco Use   Smoking status: Never    Passive exposure: Never   Smokeless tobacco: Never  Vaping Use   Vaping Use: Never used  Substance Use Topics   Alcohol use: No   Drug use: No    ROS   Objective:   Vitals: BP 131/88 (BP Location: Right Arm)   Pulse 93   Temp 99 F (37.2 C) (Oral)   Resp (!) 106   LMP 04/01/2022   SpO2 93%   Pulse oximetry was 97% on recheck.   Physical Exam Constitutional:      General: She is not in acute distress.    Appearance: Normal appearance. She is well-developed and normal weight. She is not ill-appearing, toxic-appearing or diaphoretic.  HENT:     Head: Normocephalic and atraumatic.     Right Ear: Tympanic membrane, ear canal and external ear normal. No drainage or tenderness. No middle ear effusion. There is no impacted cerumen. Tympanic membrane is not erythematous or bulging.     Left Ear: Tympanic membrane, ear canal and external ear normal. No drainage or tenderness.  No middle ear effusion. There is no impacted cerumen. Tympanic membrane is not erythematous or bulging.     Nose:  Congestion present. No rhinorrhea.     Mouth/Throat:     Mouth: No oral lesions.     Pharynx: No pharyngeal swelling, oropharyngeal exudate, posterior oropharyngeal erythema or uvula swelling.     Tonsils: No tonsillar exudate or tonsillar abscesses.     Comments: Thick streaks of postnasal drainage overlying pharynx. Eyes:     General: No scleral icterus.       Right eye: No discharge.  Left eye: No discharge.     Extraocular Movements: Extraocular movements intact.     Right eye: Normal extraocular motion.     Left eye: Normal extraocular motion.     Conjunctiva/sclera: Conjunctivae normal.  Cardiovascular:     Rate and Rhythm: Normal rate and regular rhythm.     Heart sounds: Normal heart sounds. No murmur heard.    No friction rub. No gallop.  Pulmonary:     Effort: Pulmonary effort is normal. No respiratory distress.     Breath sounds: No stridor. No wheezing, rhonchi or rales.  Chest:     Chest wall: No tenderness.  Musculoskeletal:     Cervical back: Normal range of motion and neck supple.  Lymphadenopathy:     Cervical: No cervical adenopathy.  Skin:    General: Skin is warm and dry.  Neurological:     General: No focal deficit present.     Mental Status: She is alert and oriented to person, place, and time.  Psychiatric:        Mood and Affect: Mood normal.        Behavior: Behavior normal.     Assessment and Plan :   PDMP not reviewed this encounter.  1. Allergic rhinitis, unspecified seasonality, unspecified trigger   2. Recurrent sinusitis     Discussed treatment options for patient.  For now we will hone in on her underlying allergies.  She likely had a viral illness that is worsened by under lying difficult to control allergies.  Will start prednisone.  If she has no improvement in the next 2 to 3 days, can start amoxicillin to address her recurrent sinusitis. Deferred imaging given clear cardiopulmonary exam, hemodynamically stable vital signs.  Counseled patient on potential for adverse effects with medications prescribed/recommended today, ER and return-to-clinic precautions discussed, patient verbalized understanding.    Jaynee Eagles, [place] 04/13/22 [phone]

## 2022-04-13 NOTE — ED Triage Notes (Signed)
Pt c/o sinus congestion, runny nose, sore throat x 1 week-NAD-steady gait

## 2022-04-13 NOTE — Discharge Instructions (Signed)
Start prednisone for your sinus inflammation likely due to your severe allergies. If by Wednesday, you have no improvement then go ahead and fill the prescription I am handing to you for a recurrent sinus infection.

## 2022-04-15 ENCOUNTER — Encounter: Payer: Medicaid Other | Admitting: Gastroenterology

## 2022-04-15 ENCOUNTER — Encounter: Payer: Self-pay | Admitting: *Deleted

## 2022-04-15 ENCOUNTER — Telehealth: Payer: Self-pay | Admitting: Gastroenterology

## 2022-04-15 NOTE — Telephone Encounter (Signed)
Returned pt's call. She stated she ate waffles and chicken noodle soup yesterday and was asking if she could proceed with procedure today. After talking with pt RN discovered that pt did not drink her first dose of bowel prep last night per instructions and had not yet started the prep today. She asked if she could drink it all this morning and RN stated that she would not be prepped adequately by the time of her procedure start time. RN instructed that we could still do the EGD today if she has not eaten. Pt stated that she has not eaten anything today and that she would like to proceed with EGD. RN reviewed instructions multiple times with pt including only being allowed to have clear liquids until 12pm, absolutely no food. Instructed her to not have anything in her mouth after 12pm today and that she was to arrive at 2pm for her 3pm appointment. Also verified that pt had a care partner available to stay the entire time of her appointment and to drive her home. Pt called her care partner while on the phone with RN to verify they were available and she stated that they were. Plan to proceed with EGD and will reschedule colonoscopy while she is here today. Pt states she has the Suprep at home as she had already picked it up from the pharmacy.

## 2022-04-15 NOTE — Telephone Encounter (Signed)
Inbound call from patient stating that she is scheduled to have a endoscopy and colonoscopy today at 3:00 and is requesting a call back to discuss if she can still have procedure, patient stated that she had some waffles to eat yesterday. Please advise.

## 2022-04-15 NOTE — Telephone Encounter (Signed)
Patient called back and stated she wanted to reschedule the EGD as well. Patient was rescheduled for both procedures for 3/12 at 10:30

## 2022-04-15 NOTE — Telephone Encounter (Signed)
Discharge letter created and sent to Dr Fuller Plan.  Discharge form to be completed and put on Dr Lynne Leader desk.    FYI Dr Fuller Plan.

## 2022-04-15 NOTE — Telephone Encounter (Signed)
Sherri [name],  This the 3rd recent [place] no show, late cancellation. She had a recent office appt to hopefully make today colonoscopy, EGD successful. Please discharge her from the practice, get form and letter ready and cancel her rescheduled procedures.  MS

## 2022-04-17 ENCOUNTER — Telehealth: Payer: Self-pay | Admitting: Gastroenterology

## 2022-04-17 NOTE — Telephone Encounter (Signed)
Patient dismissed from physician Wende Crease, MD care 04/15/22

## 2022-04-21 ENCOUNTER — Other Ambulatory Visit (HOSPITAL_COMMUNITY): Payer: Self-pay

## 2022-04-21 NOTE — Progress Notes (Unsigned)
NEUROLOGY CONSULTATION NOTE  ZAN LOAYZA MRN: [id number] DOB: Jul 14, 1985  Referring provider: Durene Fruits, NP Primary care provider: Virgel Manifold, MD  Reason for consult:  headache  Assessment/Plan:   Chronic migraine without aura, without status migrainosus, not intractable  Migraine prevention:  start topiramate 26m at bedtime.  Side effects discussed including taking measures not to get pregnant. Migraine rescue:  Increase sumatriptan to 1023m  Zofran ODT 80m83mLimit use of pain relievers to no more than 2 days out of week to prevent risk of rebound or medication-overuse headache. Keep headache diary Follow up 4 to 5 months.    Subjective:  Sherri [name] a 36 38ar old female with DM II and iron-deficiency anemia who presents for headaches.  History supplemented by referring provider's note.  Started having headaches starting around September 2023.  No prior history.  No known preceding trigger (illness, head injury, new medication) except for possibly stress.  They are moderate but can be severe.  It is a pounding pain across the forehead.  No neck pain.  Associated with nausea, photophobia, phonophobia, osmophobia.  No associated vomiting, visual disturbance or unilateral numbness or weakness.  No preceding aura or prodrome.  Often wakes up with headache.  They often last all day.  They occur every other day.  Usually will take a pain reliever no more than twice a week.    They were frequent in September and October, requiring 2 ED/Urgent Care visits.  CT head on 11/30/2021 personally reviewed was normal.    She also has chronic abdominal pain with nausea, vomiting and constipation.  GI workup has been negative.    Past NSAIDS/analgesics:  acetaminophen, Excedrin, ibuprofen, diclofenac, naproxen Past abortive triptans:  none Past abortive ergotamine:  none Past muscle relaxants:  Flexeril, Robaxin Past anti-emetic:  promethazine Past antihypertensive  medications:  none Past antidepressant medications:  duloxetine, mirtazapine Past anticonvulsant medications:  none Past anti-CGRP:  none Past vitamins/Herbal/Supplements:  none Past antihistamines/decongestants:  Zyrtec Other past therapies:  none  Current NSAIDS/analgesics:  none Current triptans:  sumatriptan 60m50mneffective) Current ergotamine:  none Current anti-emetic:  Zofran ODT 80mg 10mrent muscle relaxants:  none Current Antihypertensive medications:  none Current Antidepressant medications:  amitriptyline 50mg 66m(to help sleep) Current Anticonvulsant medications:  none Current anti-CGRP:  none Current Antihistamines/Decongestants:  Flonase, Allegra, Sudafed Other therapy:  none Birth control:  none   Caffeine:  No coffee or colas Diet:  Sometimes ginger ale.  Trying to increase water intake.  Eats fast food.   Exercise:  no Depression:  no; Anxiety:  yes Sleep hygiene:  good with amitriptyline.  Gets 6-7 hours.  Wakes up tired.  She has been told that she snores in the past.   Family history of headache:  no. No family history of cerebral aneurysms.        PAST MEDICAL HISTORY: Past Medical History:  Diagnosis Date   Diabetes mellitus without complication (HCC)  [place]    denies today   Genital herpes    Hypertension    gestational   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    Urinary tract infection    Uterine fibroid     PAST SURGICAL HISTORY: Past Surgical History:  Procedure Laterality Date   CESAREAN SECTION N/A 06/05/2014   Procedure: CESAREAN SECTION;  Surgeon: Jacob Truett Mainland [facility];  Laterality: N/A;   WISDOM TOOTH EXTRACTION      MEDICATIONS:  Current Outpatient Medications on File Prior to Visit  Medication Sig Dispense Refill   acyclovir cream (ZOVIRAX) 5 % Apply 1 Application topically 5 (five) times daily. 5 g 0   amitriptyline (ELAVIL) 25 MG tablet Take 2 tablets (50 mg  total) by mouth at bedtime. 60 tablet 2   amoxicillin (AMOXIL) 875 MG tablet Take 1 tablet (875 mg total) by mouth 2 (two) times daily. 14 tablet 0   Cholecalciferol (VITAMIN D3) 125 MCG (5000 UT) CAPS Take 1 capsule by mouth daily.     clindamycin (CLEOCIN T) 1 % lotion Apply to cleansed face daily in the morning. 60 mL 1   famotidine (PEPCID) 40 MG tablet Take 1 tablet (40 mg total) by mouth at bedtime. 90 tablet 0   Ferrous Sulfate (IRON) 28 MG TABS Take 1 tablet by mouth daily.     fexofenadine (ALLEGRA) 180 MG tablet Take 1 tablet (180 mg total) by mouth daily. 90 tablet 3   fluconazole (DIFLUCAN) 150 MG tablet Take 1 tablet (150 mg total) by mouth every 3 (three) days. 3 tablet 0   fluticasone (FLONASE) 50 MCG/ACT nasal spray Place 1 spray into both nostrils daily. Begin by using 2 sprays in each nare daily for 3 to 5 days, then decrease to 1 spray in each nare daily. 18 mL 5   ipratropium (ATROVENT) 0.06 % nasal spray Place 2 sprays into both nostrils 3 (three) times daily. As needed for nasal congestion, runny nose 15 mL 2   montelukast (SINGULAIR) 10 MG tablet Take 1 tablet (10 mg total) by mouth at bedtime. 90 tablet 0   ondansetron (ZOFRAN-ODT) 4 MG disintegrating tablet Take 1 tablet (4 mg total) by mouth every 8 (eight) hours as needed for nausea or vomiting. 20 tablet 0   pantoprazole (PROTONIX) 40 MG tablet Take 1 tablet (40 mg total) by mouth daily. 30 tablet 6   polyethylene glycol powder (GLYCOLAX/MIRALAX) 17 GM/SCOOP powder Take 17 g by mouth daily as needed for mild constipation. 238 g 0   predniSONE (DELTASONE) 20 MG tablet Take 2 tablets daily with breakfast. 10 tablet 0   promethazine (PHENERGAN) 25 MG tablet Take 1 tablet (25 mg total) by mouth every 6 (six) hours as needed for nausea or vomiting. 30 tablet 0   pseudoephedrine (SUDAFED) 60 MG tablet Take 1 tablet (60 mg total) by mouth every 8 (eight) hours as needed for congestion. 30 tablet 0   SUMAtriptan (IMITREX) 25 MG  tablet Take 25 mg (1 tablet total) by mouth at the start of the headache. May repeat in 2 hours x 1 if headache persists. Max of 2 tablets/24 hours. 30 tablet 1   tretinoin (RETIN-A) 0.025 % cream Apply pea size amount to cleansed face every other day at bedtime. Increase to every night as tolerated. 20 g 0   triamcinolone cream (KENALOG) 0.1 % Apply to affected areas twice daily for 3 days on and 3 days off cycle or as needed. 80 g 0   No current facility-administered medications on file prior to visit.    ALLERGIES: Allergies  Allergen Reactions   Macrobid [Nitrofurantoin] Nausea And Vomiting   Augmentin [Amoxicillin-Pot Clavulanate]     Pt states it makes her sick   Cefdinir Itching   Metformin And Related Nausea And Vomiting    FAMILY HISTORY: Family History  Problem Relation Age of Onset   Hypertension Mother    Hypertension Father    Breast cancer Maternal Grandmother  70   Colon cancer Neg Hx    Colon polyps Neg Hx    Kidney disease Neg Hx    Diabetes Neg Hx    Esophageal cancer Neg Hx    Gallbladder disease Neg Hx    Heart disease Neg Hx    Asthma Neg Hx    Stroke Neg Hx    Stomach cancer Neg Hx     Objective:  Blood pressure 134/85, pulse 82, height 4' 11"$  (1.499 m), weight 183 lb 14.4 oz (83.4 kg), last menstrual period 04/01/2022, SpO2 98 %. General: No acute distress.  Patient appears well-groomed.   Head:  Normocephalic/atraumatic Eyes:  fundi examined but not visualized Neck: supple, no paraspinal tenderness, full range of motion Back: No paraspinal tenderness Heart: regular rate and rhythm Lungs: Clear to auscultation bilaterally. Vascular: No carotid bruits. Neurological Exam: Mental status: alert and oriented to person, place, and time, speech fluent and not dysarthric, language intact. Cranial nerves: CN I: not tested CN II: pupils equal, round and reactive to light, visual fields intact CN III, IV, VI:  full range of motion, no nystagmus, no  ptosis CN V: facial sensation intact. CN VII: upper and lower face symmetric CN VIII: hearing intact CN IX, X: gag intact, uvula midline CN XI: sternocleidomastoid and trapezius muscles intact CN XII: tongue midline Bulk & Tone: normal, no fasciculations. Motor:  muscle strength 5/5 throughout Sensation:  Pinprick, temperature and vibratory sensation intact. Deep Tendon Reflexes:  2+ throughout,  toes downgoing.   Finger to nose testing:  Without dysmetria.   Heel to shin:  Without dysmetria.   Gait:  Normal station and stride.  Romberg negative.    Thank you for allowing me to take part in the care of this patient.  Metta Clines, DO  CC:  Durene Fruits, NP  Virgel Manifold, MD

## 2022-04-22 ENCOUNTER — Encounter: Payer: Self-pay | Admitting: Neurology

## 2022-04-22 ENCOUNTER — Ambulatory Visit (INDEPENDENT_AMBULATORY_CARE_PROVIDER_SITE_OTHER): Payer: Medicaid Other | Admitting: Neurology

## 2022-04-22 VITALS — BP 134/85 | HR 82 | Ht 59.0 in | Wt 183.9 lb

## 2022-04-22 DIAGNOSIS — G43709 Chronic migraine without aura, not intractable, without status migrainosus: Secondary | ICD-10-CM

## 2022-04-22 MED ORDER — ONDANSETRON 4 MG PO TBDP: 4.0000 mg | ORAL_TABLET | Freq: Three times a day (TID) | ORAL | 5 refills | Status: DC | PRN

## 2022-04-22 MED ORDER — SUMATRIPTAN SUCCINATE 100 MG PO TABS: ORAL_TABLET | ORAL | 5 refills | Status: DC

## 2022-04-22 MED ORDER — TOPIRAMATE 25 MG PO TABS: 25.0000 mg | ORAL_TABLET | Freq: Every day | ORAL | 5 refills | Status: DC

## 2022-04-22 NOTE — Patient Instructions (Signed)
  Start topiramate 42m at bedtime.  Contact uKoreain 4 weeks with update and we can increase dose if needed. Take sumatriptan 1027mat earliest onset of headache.  May repeat dose once in 2 hours if needed.  Maximum 2 tablets in 24 hours.  Ondansetron for nausea. Limit use of pain relievers to no more than 2 days out of the week.  These medications include acetaminophen, NSAIDs (ibuprofen/Advil/Motrin, naproxen/Aleve, triptans (Imitrex/sumatriptan), Excedrin, and narcotics.  This will help reduce risk of rebound headaches. Routine exercise Stay adequately hydrated (aim for 64 oz water daily) Keep headache diary Maintain proper stress management Maintain proper sleep hygiene Do not skip meals Consider supplements:  magnesium citrate 40089maily, riboflavin 400m66mily, coenzyme Q10 100mg71mee times daily. Follow up in 4 to 5 months.

## 2022-04-24 ENCOUNTER — Ambulatory Visit
Admission: EM | Admit: 2022-04-24 | Discharge: 2022-04-24 | Disposition: A | Discharge status: Home / Self Care | Payer: Medicaid Other | Attending: Nurse Practitioner | Admitting: Nurse Practitioner

## 2022-04-24 ENCOUNTER — Ambulatory Visit (INDEPENDENT_AMBULATORY_CARE_PROVIDER_SITE_OTHER): Payer: Medicaid Other

## 2022-04-24 DIAGNOSIS — R1084 Generalized abdominal pain: Secondary | ICD-10-CM | POA: Diagnosis not present

## 2022-04-24 LAB — POCT URINE PREGNANCY: Preg Test, Ur: NEGATIVE

## 2022-04-24 LAB — POCT URINALYSIS DIP (MANUAL ENTRY)
Bilirubin, UA: NEGATIVE
Blood, UA: NEGATIVE
Glucose, UA: NEGATIVE mg/dL
Ketones, POC UA: NEGATIVE mg/dL
Leukocytes, UA: NEGATIVE
Nitrite, UA: NEGATIVE
Protein Ur, POC: NEGATIVE mg/dL
Spec Grav, UA: >=1.03 — AB (ref 1.010–1.025)
Urobilinogen, UA: 0.2 E.U./dL
pH, UA: 6 (ref 5.0–8.0)

## 2022-04-24 NOTE — ED Triage Notes (Signed)
Pt presents with continued lower abdominal crams with back pain after completing her menstrual cycle on Monday

## 2022-04-24 NOTE — ED Provider Notes (Signed)
[facility]    CSN: [id number] Arrival date & time: 04/24/22  [id number]      History   Chief Complaint Chief Complaint  Patient presents with   Abdominal Pain    HPI Sherri [name] is a 37 y.o. female pain presents for evaluation of abdominal pain.  Patient reports the past 4 to 5 days she has had a constant generalized abdominal pain that wraps around to her lower back.  Describes it as a cramping type pain.  States it has been worsening since onset.  Associated with nausea and chills and vomiting and diarrhea.  No dysuria.  Denies any blood in the vomit or stool.  Does have a past medical history of chronic abdominal pain/constipation/nausea and vomiting for which she has seen GI for.  GI note reports extensive abdominal imaging has been unrevealing as have her labs.  She was to have a endoscopy and colonoscopy but is unclear if this has been done yet.  She states she is eating and drinking normally.  Recently completed amoxicillin for sinusitis.  States she was having the symptoms while on the medication as well.  Reports a diarrhea has partially formed stool and is not all liquid.  She cannot identify any aggravating or alleviating factors.  She is taken Pepto OTC without improvement.  No other concerns at this time.   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    Urinary tract infection    Uterine fibroid     Patient Active Problem List   Diagnosis Date Noted   Pre-diabetes 07/23/2021   Anxiety and depression 06/24/2021   Fibromyalgia 06/24/2021   Prediabetes 07/05/2020   Gastroesophageal reflux disease 04/26/2020   Perianal rash 03/26/2020   Chronic nausea 03/26/2020   Chronic constipation 03/09/2019   Pelvic pain in female 03/09/2019   Hypertension    Genital  herpes    Uterine fibroid 12/25/2013   Obesity 12/25/2013   Bacterial vaginitis 04/27/2012    Past Surgical History:  Procedure Laterality Date   CESAREAN SECTION N/A 06/05/2014   Procedure: CESAREAN SECTION;  Surgeon: Truett Mainland, DO;  Location: [facility];  Service: Obstetrics;  Laterality: N/A;   WISDOM TOOTH EXTRACTION      OB History     Gravida  1   Para  1   Term      Preterm  1   AB      Living  2      SAB      IAB      Ectopic      Multiple  1   Live Births  2            Home Medications    Prior to Admission medications   Medication Sig Start Date End Date Taking? Authorizing Provider  acyclovir cream (ZOVIRAX) 5 % Apply 1 Application topically 5 (five) times daily. 02/07/22   Peri Jefferson, PA-C  amitriptyline (ELAVIL) 25 MG tablet Take 2 tablets (50 mg total) by mouth at bedtime. 06/24/21 04/24/22  Mayers, Loraine Grip, PA-C  Cholecalciferol (VITAMIN D3) 125 MCG (5000 UT) CAPS Take 1 capsule by mouth daily.    [provider]  clindamycin (CLEOCIN T) 1 % lotion Apply to cleansed face daily in the  morning. 06/30/21     famotidine (PEPCID) 40 MG tablet Take 1 tablet (40 mg total) by mouth at bedtime. 01/27/22 04/27/22  Lynden Oxford Scales, PA-C  Ferrous Sulfate (IRON) 28 MG TABS Take 1 tablet by mouth daily.    [provider]  fexofenadine (ALLEGRA) 180 MG tablet Take 1 tablet (180 mg total) by mouth daily. 03/08/22   Jaynee Eagles, PA-C  fluconazole (DIFLUCAN) 150 MG tablet Take 1 tablet (150 mg total) by mouth every 3 (three) days. 03/02/22   Jaynee Eagles, PA-C  fluticasone (FLONASE) 50 MCG/ACT nasal spray Place 1 spray into both nostrils daily. Begin by using 2 sprays in each nare daily for 3 to 5 days, then decrease to 1 spray in each nare daily. 03/08/22   Jaynee Eagles, PA-C  ipratropium (ATROVENT) 0.06 % nasal spray Place 2 sprays into both nostrils 3 (three) times daily. As needed for nasal congestion, runny nose 01/27/22   Lynden Oxford  Scales, PA-C  montelukast (SINGULAIR) 10 MG tablet Take 1 tablet (10 mg total) by mouth at bedtime. 01/27/22 04/27/22  Lynden Oxford Scales, PA-C  ondansetron (ZOFRAN-ODT) 4 MG disintegrating tablet Take 1 tablet (4 mg total) by mouth every 8 (eight) hours as needed for nausea or vomiting. 04/22/22   Tomi Likens, Adam R, DO  pantoprazole (PROTONIX) 40 MG tablet Take 1 tablet (40 mg total) by mouth daily. 11/17/21   Willia Craze, NP  polyethylene glycol powder (GLYCOLAX/MIRALAX) 17 GM/SCOOP powder Take 17 g by mouth daily as needed for mild constipation. 09/27/21   Carlisle Cater, PA-C  predniSONE (DELTASONE) 20 MG tablet Take 2 tablets daily with breakfast. 04/13/22   Jaynee Eagles, PA-C  promethazine (PHENERGAN) 25 MG tablet Take 1 tablet (25 mg total) by mouth every 6 (six) hours as needed for nausea or vomiting. 03/20/22   Jaynee Eagles, PA-C  pseudoephedrine (SUDAFED) 60 MG tablet Take 1 tablet (60 mg total) by mouth every 8 (eight) hours as needed for congestion. 03/08/22   Jaynee Eagles, PA-C  SUMAtriptan (IMITREX) 100 MG tablet Take 1 tablet at earliest onset of migraine.  May repeat in 2 hours if needed.  Maximum 2 tablets in 24 hours. 04/22/22   Pieter Partridge, DO  topiramate (TOPAMAX) 25 MG tablet Take 1 tablet (25 mg total) by mouth at bedtime. 04/22/22   Pieter Partridge, DO  tretinoin (RETIN-A) 0.025 % cream Apply pea size amount to cleansed face every other day at bedtime. Increase to every night as tolerated. 06/30/21     triamcinolone cream (KENALOG) 0.1 % Apply to affected areas twice daily for 3 days on and 3 days off cycle or as needed. 06/30/21       Family History Family History  Problem Relation Age of Onset   Hypertension Mother    Hypertension Father    Breast cancer Maternal Grandmother        65   Colon cancer Neg Hx    Colon polyps Neg Hx    Kidney disease Neg Hx    Diabetes Neg Hx    Esophageal cancer Neg Hx    Gallbladder disease Neg Hx    Heart disease Neg Hx    Asthma Neg Hx     Stroke Neg Hx    Stomach cancer Neg Hx     Social History Social History   Tobacco Use   Smoking status: Never    Passive exposure: Never   Smokeless tobacco: Never  Vaping Use   Vaping Use: Never  used  Substance Use Topics   Alcohol use: No   Drug use: No     Allergies   Macrobid [nitrofurantoin], Augmentin [amoxicillin-pot clavulanate], Cefdinir, and Metformin and related   Review of Systems Review of Systems  Gastrointestinal:  Positive for abdominal pain.     Physical Exam Triage Vital Signs ED Triage Vitals  Enc Vitals Group     BP 04/24/22 0834 119/78     Pulse Rate 04/24/22 0834 82     Resp 04/24/22 0834 17     Temp 04/24/22 0834 98.6 F (37 C)     Temp Source 04/24/22 0834 Oral     SpO2 04/24/22 0834 96 %     Weight --      Height --      Head Circumference --      Peak Flow --      Pain Score 04/24/22 0836 9     Pain Loc --      Pain Edu? --      Excl. in [place]? --    No data found.  Updated Vital Signs BP 119/78 (BP Location: Right Arm)   Pulse 82   Temp 98.6 F (37 C) (Oral)   Resp 17   LMP 04/01/2022   SpO2 96%   Visual Acuity Right Eye Distance:   Left Eye Distance:   Bilateral Distance:    Right Eye Near:   Left Eye Near:    Bilateral Near:     Physical Exam Vitals and nursing note reviewed.  Constitutional:      General: She is not in acute distress.    Appearance: She is well-developed. She is obese. She is not ill-appearing, toxic-appearing or diaphoretic.  HENT:     Head: Normocephalic and atraumatic.  Eyes:     Pupils: Pupils are equal, round, and reactive to light.  Cardiovascular:     Rate and Rhythm: Normal rate.  Pulmonary:     Effort: Pulmonary effort is normal.  Abdominal:     General: Bowel sounds are normal.     Palpations: Abdomen is soft.     Tenderness: There is generalized abdominal tenderness. Negative signs include Rovsing's sign and McBurney's sign.  Skin:    General: Skin is warm and dry.   Neurological:     General: No focal deficit present.     Mental Status: She is alert and oriented to person, place, and time.  Psychiatric:        Mood and Affect: Mood normal.        Behavior: Behavior normal.      UC Treatments / Results  Labs (all labs ordered are listed, but only abnormal results are displayed) Labs Reviewed  POCT URINALYSIS DIP (MANUAL ENTRY) - Abnormal; Notable for the following components:      Result Value   Clarity, UA clear (*)    Spec Grav, UA >=1.030 (*)    All other components within normal limits  POCT URINE PREGNANCY    EKG   Radiology DG Abd 1 View  Result Date: 04/24/2022 CLINICAL DATA:  Abdominal pain EXAM: ABDOMEN - 1 VIEW COMPARISON:  08/30/2019, 09/27/2021 FINDINGS: The bowel gas pattern is normal. No radio-opaque calculi or other significant radiographic abnormality are seen. IMPRESSION: Negative. Electronically Signed   By: Davina Poke D.O.   On: 04/24/2022 09:21    Procedures Procedures (including critical care time)  Medications Ordered in UC Medications - No data to display  Initial Impression / Assessment and  Plan / UC Course  I have reviewed the triage vital signs and the nursing notes.  Pertinent labs & imaging results that were available during my care of the patient were reviewed by me and considered in my medical decision making (see chart for details).     Urine hCG and UA negative Abdominal x-ray unremarkable Discussed symptoms and exam with patient.  Given her reported worsening generalized abdominal pain with negative workup in clinic I advise she go to the emergency room for further evaluation.  Patient is in agreement with plan will go POV to the emergency room.  She was instructed to follow-up or call 911 for any worsening symptoms that occur in transit and she verbalized understanding Final Clinical Impressions(s) / UC Diagnoses   Final diagnoses:  Generalized abdominal pain   Discharge Instructions    None    ED Prescriptions   None    PDMP not reviewed this encounter.   Melynda Ripple, NP 04/24/22 [phone]

## 2022-04-27 ENCOUNTER — Telehealth: Payer: Self-pay | Admitting: Allergy

## 2022-04-27 NOTE — Telephone Encounter (Signed)
Patient came in & dropped off signed consent forms for [facility]. Copy of forms was made & patient scheduled appointment to start allergy injections on 05-19-2022. Forms have been placed in bulk scanning in [address].

## 2022-04-28 NOTE — Addendum Note (Signed)
Addended by: Theresia Lo on: 04/28/2022 05:00 PM   Modules accepted: Orders

## 2022-04-29 DIAGNOSIS — J301 Allergic rhinitis due to pollen: Secondary | ICD-10-CM | POA: Diagnosis not present

## 2022-04-29 NOTE — Progress Notes (Signed)
VIALS EXP 04-30-23

## 2022-04-29 NOTE — Progress Notes (Signed)
Aeroallergen Immunotherapy  Ordering Provider: Dr. Prudy Feeler  Patient Details Name: CHINITA TINARI MRN: [id number] Date of Birth: 22-Jul-1985  Order 2 of 2  Vial Label: Mold, mite  0.2 ml (Volume)  1:20 Concentration -- Alternaria alternata 0.5 ml (Volume)   AU Concentration -- Mite Mix (DF 5,000 & DP 5,000)   0.7  ml Extract Subtotal 4.3  ml Diluent 5.0  ml Maintenance Total  Schedule:  B Blue Vial (1:100,000): Schedule B (6 doses) Yellow Vial (1:10,000): Schedule B (6 doses) Green Vial (1:1,000): Schedule B (6 doses) Red Vial (1:100): Schedule A (14 doses)  Special Instructions: 1-2 injections per week

## 2022-04-29 NOTE — Progress Notes (Signed)
Aeroallergen Immunotherapy   Ordering Provider: Dr. Prudy Feeler   Patient Details  Name: Sherri [name]  MRN: [id number]  Date of Birth: April 08, 1985   Order 1 of 2   Vial Label: Pollen   0.3 ml (Volume)  BAU Concentration -- 7 Grass Mix* 100,000 ([address] [place], [place], [place], [place]ial [place], [place], Sweet [place], [place])    0.3  ml Extract Subtotal  4.7  ml Diluent  5.0  ml Maintenance Total   Schedule:  B  Blue Vial (1:100,000): Schedule B (6 doses)  Yellow Vial (1:10,000): Schedule B (6 doses)  Green Vial (1:1,000): Schedule B (6 doses)  Red Vial (1:100): Schedule A (14 doses)   Special Instructions: 1-2 injections per week

## 2022-04-30 DIAGNOSIS — J3089 Other allergic rhinitis: Secondary | ICD-10-CM | POA: Diagnosis not present

## 2022-05-12 ENCOUNTER — Encounter: Payer: Medicaid Other | Admitting: Gastroenterology

## 2022-05-19 ENCOUNTER — Ambulatory Visit (INDEPENDENT_AMBULATORY_CARE_PROVIDER_SITE_OTHER): Payer: Medicaid Other

## 2022-05-19 DIAGNOSIS — J309 Allergic rhinitis, unspecified: Secondary | ICD-10-CM | POA: Diagnosis not present

## 2022-05-19 NOTE — Progress Notes (Signed)
Immunotherapy   Patient Details  Name: Sherri [name] MRN: [id number] Date of Birth: 11-15-1985  05/19/2022  Nira Conn A Aronov started injections for  Pollen and Mold-Dmite. Patient received 0.05 ml of both her blue vials with an expiration of 04/30/2023. Patient waited 30 minutes with no problems.  Following schedule: B  Frequency:2 times per week Epi-Pen:Epi-Pen Available  Consent signed and patient instructions given.   Herbie Drape 05/19/2022, 9:24 AM

## 2022-05-20 ENCOUNTER — Other Ambulatory Visit: Payer: Self-pay

## 2022-05-21 ENCOUNTER — Other Ambulatory Visit: Payer: Self-pay

## 2022-05-21 ENCOUNTER — Ambulatory Visit (INDEPENDENT_AMBULATORY_CARE_PROVIDER_SITE_OTHER): Payer: Medicaid Other

## 2022-05-21 DIAGNOSIS — J309 Allergic rhinitis, unspecified: Secondary | ICD-10-CM | POA: Diagnosis not present

## 2022-05-22 ENCOUNTER — Other Ambulatory Visit: Payer: Self-pay

## 2022-05-25 ENCOUNTER — Ambulatory Visit (INDEPENDENT_AMBULATORY_CARE_PROVIDER_SITE_OTHER): Payer: Medicaid Other | Admitting: *Deleted

## 2022-05-25 DIAGNOSIS — J309 Allergic rhinitis, unspecified: Secondary | ICD-10-CM

## 2022-05-27 ENCOUNTER — Ambulatory Visit (INDEPENDENT_AMBULATORY_CARE_PROVIDER_SITE_OTHER): Payer: Medicaid Other

## 2022-05-27 DIAGNOSIS — J309 Allergic rhinitis, unspecified: Secondary | ICD-10-CM | POA: Diagnosis not present

## 2022-06-01 ENCOUNTER — Ambulatory Visit (INDEPENDENT_AMBULATORY_CARE_PROVIDER_SITE_OTHER): Payer: Medicaid Other | Admitting: *Deleted

## 2022-06-01 DIAGNOSIS — J309 Allergic rhinitis, unspecified: Secondary | ICD-10-CM | POA: Diagnosis not present

## 2022-06-03 ENCOUNTER — Ambulatory Visit (INDEPENDENT_AMBULATORY_CARE_PROVIDER_SITE_OTHER): Payer: Medicaid Other

## 2022-06-03 DIAGNOSIS — J309 Allergic rhinitis, unspecified: Secondary | ICD-10-CM

## 2022-06-08 ENCOUNTER — Ambulatory Visit: Payer: Self-pay | Admitting: *Deleted

## 2022-06-08 ENCOUNTER — Emergency Department (HOSPITAL_COMMUNITY)
Admission: EM | Admit: 2022-06-08 | Discharge: 2022-06-08 | Disposition: A | Discharge status: Home / Self Care | Payer: Medicaid Other | Attending: Emergency Medicine | Admitting: Emergency Medicine

## 2022-06-08 ENCOUNTER — Other Ambulatory Visit: Payer: Self-pay

## 2022-06-08 ENCOUNTER — Encounter (HOSPITAL_COMMUNITY): Payer: Self-pay

## 2022-06-08 DIAGNOSIS — M791 Myalgia, unspecified site: Secondary | ICD-10-CM | POA: Diagnosis present

## 2022-06-08 DIAGNOSIS — R112 Nausea with vomiting, unspecified: Secondary | ICD-10-CM

## 2022-06-08 DIAGNOSIS — R52 Pain, unspecified: Secondary | ICD-10-CM

## 2022-06-08 DIAGNOSIS — E860 Dehydration: Secondary | ICD-10-CM | POA: Insufficient documentation

## 2022-06-08 DIAGNOSIS — Z20822 Contact with and (suspected) exposure to covid-19: Secondary | ICD-10-CM | POA: Diagnosis not present

## 2022-06-08 DIAGNOSIS — B349 Viral infection, unspecified: Secondary | ICD-10-CM | POA: Diagnosis not present

## 2022-06-08 LAB — RESP PANEL BY RT-PCR (RSV, FLU A&B, COVID)  RVPGX2
Influenza A by PCR: NEGATIVE
Influenza B by PCR: NEGATIVE
Resp Syncytial Virus by PCR: NEGATIVE
SARS Coronavirus 2 by RT PCR: NEGATIVE

## 2022-06-08 LAB — COMPREHENSIVE METABOLIC PANEL
ALT: 15 U/L (ref 0–44)
AST: 37 U/L (ref 15–41)
Albumin: 4.3 g/dL (ref 3.5–5.0)
Alkaline Phosphatase: 61 U/L (ref 38–126)
Anion gap: 9 (ref 5–15)
BUN: 13 mg/dL (ref 6–20)
CO2: 22 mmol/L (ref 22–32)
Calcium: 9.1 mg/dL (ref 8.9–10.3)
Chloride: 105 mmol/L (ref 98–111)
Creatinine, Ser: 1 mg/dL (ref 0.44–1.00)
GFR, Estimated: >60 mL/min (ref >60)
Glucose, Bld: 102 mg/dL — ABNORMAL HIGH (ref 70–99)
Potassium: 5.1 mmol/L (ref 3.5–5.1)
Sodium: 136 mmol/L (ref 135–145)
Total Bilirubin: 1.3 mg/dL — ABNORMAL HIGH (ref 0.3–1.2)
Total Protein: 7.9 g/dL (ref 6.5–8.1)

## 2022-06-08 LAB — CBC WITH DIFFERENTIAL/PLATELET
Abs Immature Granulocytes: 0.02 10*3/uL (ref 0.00–0.07)
Basophils Absolute: 0.1 10*3/uL (ref 0.0–0.1)
Basophils Relative: 1 %
Eosinophils Absolute: 0.3 10*3/uL (ref 0.0–0.5)
Eosinophils Relative: 4 %
HCT: 40.2 % (ref 36.0–46.0)
Hemoglobin: 13 g/dL (ref 12.0–15.0)
Immature Granulocytes: 0 %
Lymphocytes Relative: 37 %
Lymphs Abs: 2.4 10*3/uL (ref 0.7–4.0)
MCH: 28.6 pg (ref 26.0–34.0)
MCHC: 32.3 g/dL (ref 30.0–36.0)
MCV: 88.4 fL (ref 80.0–100.0)
Monocytes Absolute: 0.5 10*3/uL (ref 0.1–1.0)
Monocytes Relative: 7 %
Neutro Abs: 3.3 10*3/uL (ref 1.7–7.7)
Neutrophils Relative %: 51 %
Platelets: 253 10*3/uL (ref 150–400)
RBC: 4.55 MIL/uL (ref 3.87–5.11)
RDW: 14.7 % (ref 11.5–15.5)
WBC: 6.5 10*3/uL (ref 4.0–10.5)
nRBC: 0 % (ref 0.0–0.2)

## 2022-06-08 LAB — URINALYSIS, ROUTINE W REFLEX MICROSCOPIC
Bilirubin Urine: NEGATIVE
Glucose, UA: NEGATIVE mg/dL
Hgb urine dipstick: NEGATIVE
Ketones, ur: NEGATIVE mg/dL
Leukocytes,Ua: NEGATIVE
Nitrite: NEGATIVE
Protein, ur: NEGATIVE mg/dL
Specific Gravity, Urine: 1.016 (ref 1.005–1.030)
pH: 5 (ref 5.0–8.0)

## 2022-06-08 LAB — LIPASE, BLOOD: Lipase: 31 U/L (ref 11–51)

## 2022-06-08 MED ORDER — ONDANSETRON HCL 4 MG PO TABS: 4.0000 mg | ORAL_TABLET | Freq: Three times a day (TID) | ORAL | 0 refills | Status: AC | PRN

## 2022-06-08 MED ORDER — ONDANSETRON HCL 4 MG/2ML IJ SOLN
4.0000 mg | Freq: Once | INTRAMUSCULAR | Status: AC
  Administered 2022-06-08: 4 mg via INTRAVENOUS
  Filled 2022-06-08: qty 2

## 2022-06-08 MED ORDER — KETOROLAC TROMETHAMINE 15 MG/ML IJ SOLN
15.0000 mg | Freq: Once | INTRAMUSCULAR | Status: AC
  Administered 2022-06-08: 15 mg via INTRAVENOUS
  Filled 2022-06-08: qty 1

## 2022-06-08 MED ORDER — DEXAMETHASONE SODIUM PHOSPHATE 10 MG/ML IJ SOLN
10.0000 mg | Freq: Once | INTRAMUSCULAR | Status: AC
  Administered 2022-06-08: 10 mg via INTRAVENOUS
  Filled 2022-06-08: qty 1

## 2022-06-08 MED ORDER — SODIUM CHLORIDE 0.9 % IV BOLUS
1000.0000 mL | Freq: Once | INTRAVENOUS | Status: AC
  Administered 2022-06-08: 1000 mL via INTRAVENOUS

## 2022-06-08 NOTE — Discharge Instructions (Signed)
Thank you for letting us take care of you today. Your labs, swabs, and urine look ok today. We g ave you fluids, pain medication, a steroid injection, and nausea medication to treat your symptoms. I am sending you home with medication for nausea to help with your symptoms. You may take over the counter Tylenol and ibuprofen as needed. You may take up to 1000mg  Tylenol and/or 600mg  ibuprofen every 6-8 hours as needed. Do not take more than 4000mg  Tylenol or 3200mg  ibuprofen in 24 hours.   If you are still having symptoms, please be rechecked by your PCP later this week. If you develop new or worsening symptoms such as chest pain, shortness of breath, high fevers, lethargy, uncontrollable vomiting, or other new, concerning symptoms, please return to the nearest emergency department for reevaluation.  Please see instructions below for caring for yourself while dealing with a viral illness.  Viral Illness TREATMENT  Treatment is directed at relieving symptoms. There is no cure. Antibiotics are not effective because the infection is caused by a virus, not by bacteria. Treatment may include:  Increased fluid intake. Sports drinks offer valuable electrolytes, sugars, and fluids.  Breathing heated mist or steam (vaporizer or shower).  Eating chicken soup or other clear broths, and maintaining good nutrition.  Getting plenty of rest.  Using gargles or lozenges for comfort.  Increasing usage of your inhaler if you have asthma.  Return to work when your temperature has returned to normal.  Gargle warm salt water and spit it out for sore throat. Take benadryl to decrease sinus secretions. Continue to alternate between Tylenol and ibuprofen for pain and fever control.  Follow Up: Follow up with your primary care doctor for recheck of ongoing symptoms.  Return to emergency department for emergent changing or worsening of symptoms.

## 2022-06-08 NOTE — ED Notes (Addendum)
Pt in bed, pt reports a slight decrease in nausea, states that she has general body aches 7/10, pt requests another blanket, blanket given.

## 2022-06-08 NOTE — ED Provider Notes (Signed)
[place] [facility] Provider Note   CSN: [id number] Arrival date & time: 06/08/22  0645     History  Chief Complaint  Patient presents with   Emesis   Fatigue    Sherri [name] is a 37 y.o. female with past medical history GERD and vitamin D deficiency who presents to the ED complaining of generalized bodyaches, nausea, vomiting, bilateral frontal headaches, productive cough, congestion, and subjective fever for the past week.  Notes that she has approximately 2 episodes of vomiting daily.  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  She denies chest pain, shortness of breath, palpitations, dizziness, lightheadedness, sore throat, or difficulty swallowing.  She states she is starting to feel dehydrated as it has been difficult to eat and drink secondary to her nausea.  No focal abdominal pain.    Home Medications Prior to Admission medications   Medication Sig Start Date End Date Taking? Authorizing Provider  ondansetron (ZOFRAN) 4 MG tablet Take 1 tablet (4 mg total) by mouth every 8 (eight) hours as needed for up to 5 days for nausea or vomiting. 06/08/22 06/13/22 Yes Lauralei Clouse L, PA-C  acyclovir cream (ZOVIRAX) 5 % Apply 1 Application topically 5 (five) times daily. 02/07/22   Evern CoreLindquist, John, PA-C  amitriptyline (ELAVIL) 25 MG tablet Take 2 tablets (50 mg total) by mouth at bedtime. 06/24/21 04/24/22  Mayers, Kasandra Knudsenari S, PA-C  Cholecalciferol (VITAMIN D3) 125 MCG (5000 UT) CAPS Take 1 capsule by mouth daily.    [provider]  clindamycin (CLEOCIN T) 1 % lotion Apply to cleansed face daily in the morning. 06/30/21     famotidine (PEPCID) 40 MG tablet Take 1 tablet (40 mg total) by mouth at bedtime. 01/27/22 04/27/22  Theadora RamaMorgan, Lindsay Scales, PA-C  Ferrous Sulfate (IRON) 28 MG TABS Take 1 tablet by mouth daily.    [provider]  fexofenadine (ALLEGRA) 180 MG tablet Take 1 tablet (180 mg total) by mouth  daily. 03/08/22   Wallis BambergMani, Mario, PA-C  fluconazole (DIFLUCAN) 150 MG tablet Take 1 tablet (150 mg total) by mouth every 3 (three) days. 03/02/22   Wallis BambergMani, Mario, PA-C  fluticasone (FLONASE) 50 MCG/ACT nasal spray Place 1 spray into both nostrils daily. Begin by using 2 sprays in each nare daily for 3 to 5 days, then decrease to 1 spray in each nare daily. 03/08/22   Wallis BambergMani, Mario, PA-C  ipratropium (ATROVENT) 0.06 % nasal spray Place 2 sprays into both nostrils 3 (three) times daily. As needed for nasal congestion, runny nose 01/27/22   Theadora RamaMorgan, Lindsay Scales, PA-C  montelukast (SINGULAIR) 10 MG tablet Take 1 tablet (10 mg total) by mouth at bedtime. 01/27/22 04/27/22  Theadora RamaMorgan, Lindsay Scales, PA-C  pantoprazole (PROTONIX) 40 MG tablet Take 1 tablet (40 mg total) by mouth daily. 11/17/21   Meredith PelGuenther, Paula M, NP  polyethylene glycol powder (GLYCOLAX/MIRALAX) 17 GM/SCOOP powder Take 17 g by mouth daily as needed for mild constipation. 09/27/21   Renne CriglerGeiple, Joshua, PA-C  predniSONE (DELTASONE) 20 MG tablet Take 2 tablets daily with breakfast. 04/13/22   Wallis BambergMani, Mario, PA-C  promethazine (PHENERGAN) 25 MG tablet Take 1 tablet (25 mg total) by mouth every 6 (six) hours as needed for nausea or vomiting. 03/20/22   Wallis BambergMani, Mario, PA-C  pseudoephedrine (SUDAFED) 60 MG tablet Take 1 tablet (60 mg total) by mouth every 8 (eight) hours as needed for congestion. 03/08/22   Wallis BambergMani, Mario, PA-C  SUMAtriptan (  IMITREX) 100 MG tablet Take 1 tablet at earliest onset of migraine.  May repeat in 2 hours if needed.  Maximum 2 tablets in 24 hours. 04/22/22   Drema Dallas, DO  topiramate (TOPAMAX) 25 MG tablet Take 1 tablet (25 mg total) by mouth at bedtime. 04/22/22   Drema Dallas, DO  tretinoin (RETIN-A) 0.025 % cream Apply pea size amount to cleansed face every other day at bedtime. Increase to every night as tolerated. 06/30/21     triamcinolone cream (KENALOG) 0.1 % Apply to affected areas twice daily for 3 days on and 3 days off cycle or as needed.  06/30/21         Allergies    Macrobid [nitrofurantoin], Augmentin [amoxicillin-pot clavulanate], Cefdinir, and Metformin and related    Review of Systems   Review of Systems  All other systems reviewed and are negative.   Physical Exam Updated Vital Signs BP (!) 143/92 (BP Location: Right Arm)   Pulse 83   Temp 98 F (36.7 C) (Oral)   Resp 18   Ht 4\' 11"  (1.499 m)   Wt 79.4 kg   SpO2 100%   BMI 35.35 kg/m  Physical Exam Vitals and nursing note reviewed.  Constitutional:      General: She is not in acute distress.    Appearance: Normal appearance. She is not ill-appearing, toxic-appearing or diaphoretic.  HENT:     Head: Normocephalic and atraumatic.     Nose: Congestion present.     Mouth/Throat:     Mouth: Mucous membranes are dry.     Pharynx: No oropharyngeal exudate or posterior oropharyngeal erythema.     Comments: Mild voice hoarseness, widely patent airway Eyes:     General: No scleral icterus.    Extraocular Movements: Extraocular movements intact.     Conjunctiva/sclera: Conjunctivae normal.     Pupils: Pupils are equal, round, and reactive to light.  Neck:     Comments: No meningismus Cardiovascular:     Rate and Rhythm: Normal rate and regular rhythm.     Heart sounds: No murmur heard.    No gallop.  Pulmonary:     Effort: Pulmonary effort is normal. No respiratory distress.     Breath sounds: Normal breath sounds. No stridor. No wheezing, rhonchi or rales.  Abdominal:     General: Abdomen is flat. There is no distension.     Palpations: Abdomen is soft.     Tenderness: There is no abdominal tenderness. There is no right CVA tenderness, left CVA tenderness, guarding or rebound.  Musculoskeletal:        General: Normal range of motion.     Cervical back: Normal range of motion and neck supple. No rigidity.     Right lower leg: No edema.     Left lower leg: No edema.  Skin:    General: Skin is warm and dry.     Capillary Refill: Capillary refill  takes 2 to 3 seconds.     Coloration: Skin is not jaundiced or pale.     Findings: No rash.  Neurological:     General: No focal deficit present.     Mental Status: She is alert and oriented to person, place, and time.  Psychiatric:        Mood and Affect: Mood normal.        Behavior: Behavior normal.     ED Results / Procedures / Treatments   Labs (all labs ordered are listed, but only  abnormal results are displayed) Labs Reviewed  COMPREHENSIVE METABOLIC PANEL - Abnormal; Notable for the following components:      Result Value   Glucose, Bld 102 (*)    Total Bilirubin 1.3 (*)    All other components within normal limits  URINALYSIS, ROUTINE W REFLEX MICROSCOPIC - Abnormal; Notable for the following components:   APPearance HAZY (*)    All other components within normal limits  RESP PANEL BY RT-PCR (RSV, FLU A&B, COVID)  RVPGX2  CBC WITH DIFFERENTIAL/PLATELET  LIPASE, BLOOD    EKG None  Radiology No results found.  Procedures Procedures    Medications Ordered in ED Medications  sodium chloride 0.9 % bolus 1,000 mL (0 mLs Intravenous Stopped 06/08/22 0924)  ondansetron (ZOFRAN) injection 4 mg (4 mg Intravenous Given 06/08/22 0738)  ketorolac (TORADOL) 15 MG/ML injection 15 mg (15 mg Intravenous Given 06/08/22 0915)  dexamethasone (DECADRON) injection 10 mg (10 mg Intravenous Given 06/08/22 1610)    ED Course/ Medical Decision Making/ A&P                             Medical Decision Making Amount and/or Complexity of Data Reviewed Labs: ordered. Decision-making details documented in ED Course.  Risk Prescription drug management.   Medical Decision Making:   VERITY GILCREST is a 37 y.o. female who presented to the ED today with multiple symptoms as above including generalized bodyaches and vomiting detailed above.    Patient's presentation is complicated by their history of possible sick contacts.  Patient placed on continuous vitals and telemetry monitoring  while in ED which was reviewed periodically.  Complete initial physical exam performed, notably the patient  was in no acute distress.  Abdomen soft, nontender, and without rebound, guarding, or peritoneal signs.  She did appear clinically dehydrated.  Normal oropharyngeal exam as above.  Lungs clear to auscultation.  Neurologically intact. Reviewed and confirmed nursing documentation for past medical history, family history, social history.    Initial Assessment:   With the patient's presentation of multiple symptoms, most likely diagnosis is viral syndrome. Differential diagnosis includes but is not limited to COVID-19, RSV, influenza, viral upper respiratory infection, pneumonia, meningitis, bronchitis, pharyngitis, laryngitis, electrolyte disturbance, pancreatitis, dehydration, UTI, acute abdomen. This is most consistent with an acute complicated illness  Initial Plan:  Screening labs including CBC and Metabolic panel to evaluate for infectious or metabolic etiology of disease.  Urinalysis with reflex culture ordered to evaluate for UTI or relevant urologic/nephrologic pathology.  Lipase to evaluate for pancreatitis Viral swabs Zofran and IV fluids for symptomatic management Dose of Decadron to treat any underlying bronchitis Toradol for pain management Objective evaluation as reviewed   Initial Study Results:   Laboratory  All laboratory results reviewed without evidence of clinically relevant pathology.     Final Assessment and Plan:   This is a 37 year old female who presents to the ED with multiple symptoms including URI symptoms, generalized bodyaches, nausea, and vomiting for the last week.  On exam, she does appear slightly dehydrated but lungs are clear to auscultation, no tachycardia, no increased respiratory effort, no meningismus, normal mentation.  Some voice hoarseness but patent airway and no significant oropharyngeal findings otherwise.  Normal oxygen saturation on room  air.  Suspect viral process but labs obtained as above for further evaluation.  Viral swabs negative.  CBC, metabolic panel unremarkable.  UA does not show UTI.  Patient with improvement following IV fluids  and Zofran.  She is nontoxic-appearing.  Vital signs stable.  Dose of Toradol given for pain management as well as dose of Decadron for any underlying bronchitis.  Discussed all findings with patient as well as discharge plan including Zofran, follow-up with primary care, strict ED return precautions.  Patient expressed understanding of plan.  All questions answered and stable for discharge.   Clinical Impression:  1. Viral syndrome   2. Dehydration   3. Nausea and vomiting, unspecified vomiting type   4. Body aches      Discharge           Final Clinical Impression(s) / ED Diagnoses Final diagnoses:  Viral syndrome  Dehydration  Nausea and vomiting, unspecified vomiting type  Body aches    Rx / DC Orders ED Discharge Orders          Ordered    ondansetron (ZOFRAN) 4 MG tablet  Every 8 hours PRN        06/08/22 0921              Tonette LedererGowens, Denean Pavon L, PA-C 06/08/22 [id number]    Derwood KaplanNanavati, Ankit, MD 06/16/22 0000

## 2022-06-08 NOTE — ED Triage Notes (Signed)
Patient reports body aches/fatigue, feeling dehydrated, n/v for almost a week now. States she has also had a cough and congestion.

## 2022-06-10 ENCOUNTER — Ambulatory Visit (INDEPENDENT_AMBULATORY_CARE_PROVIDER_SITE_OTHER): Payer: Medicaid Other

## 2022-06-10 DIAGNOSIS — J309 Allergic rhinitis, unspecified: Secondary | ICD-10-CM

## 2022-06-12 ENCOUNTER — Ambulatory Visit (INDEPENDENT_AMBULATORY_CARE_PROVIDER_SITE_OTHER): Payer: Medicaid Other

## 2022-06-12 DIAGNOSIS — J309 Allergic rhinitis, unspecified: Secondary | ICD-10-CM

## 2022-06-15 ENCOUNTER — Ambulatory Visit (INDEPENDENT_AMBULATORY_CARE_PROVIDER_SITE_OTHER): Payer: Medicaid Other | Admitting: *Deleted

## 2022-06-15 DIAGNOSIS — J309 Allergic rhinitis, unspecified: Secondary | ICD-10-CM | POA: Diagnosis not present

## 2022-06-17 ENCOUNTER — Ambulatory Visit (INDEPENDENT_AMBULATORY_CARE_PROVIDER_SITE_OTHER): Payer: Medicaid Other

## 2022-06-17 DIAGNOSIS — J309 Allergic rhinitis, unspecified: Secondary | ICD-10-CM | POA: Diagnosis not present

## 2022-06-22 ENCOUNTER — Ambulatory Visit (INDEPENDENT_AMBULATORY_CARE_PROVIDER_SITE_OTHER): Payer: Medicaid Other

## 2022-06-22 DIAGNOSIS — J309 Allergic rhinitis, unspecified: Secondary | ICD-10-CM

## 2022-06-24 ENCOUNTER — Ambulatory Visit (INDEPENDENT_AMBULATORY_CARE_PROVIDER_SITE_OTHER): Payer: Medicaid Other

## 2022-06-24 DIAGNOSIS — J309 Allergic rhinitis, unspecified: Secondary | ICD-10-CM

## 2022-06-29 ENCOUNTER — Ambulatory Visit (INDEPENDENT_AMBULATORY_CARE_PROVIDER_SITE_OTHER): Payer: Medicaid Other

## 2022-06-29 DIAGNOSIS — J309 Allergic rhinitis, unspecified: Secondary | ICD-10-CM | POA: Diagnosis not present

## 2022-07-01 ENCOUNTER — Telehealth: Payer: Self-pay | Admitting: *Deleted

## 2022-07-01 ENCOUNTER — Ambulatory Visit (INDEPENDENT_AMBULATORY_CARE_PROVIDER_SITE_OTHER): Payer: Medicaid Other | Admitting: *Deleted

## 2022-07-01 DIAGNOSIS — J309 Allergic rhinitis, unspecified: Secondary | ICD-10-CM | POA: Diagnosis not present

## 2022-07-01 MED ORDER — CETIRIZINE HCL 10 MG PO TABS: 10.0000 mg | ORAL_TABLET | Freq: Every day | ORAL | 5 refills | Status: DC

## 2022-07-01 NOTE — Telephone Encounter (Signed)
Patient was wondering if we could send in a prescription of Zyrtec to the [facility] on [address]. I didn't see mention in your last note I just wanted to double check first.

## 2022-07-01 NOTE — Addendum Note (Signed)
Addended by: Rolland Bimler D on: 07/01/2022 02:46 PM   Modules accepted: Orders

## 2022-07-01 NOTE — Telephone Encounter (Signed)
Medication has been sent into requested pharmacy. This has been approved by Dr. Delorse Lek.

## 2022-07-02 ENCOUNTER — Other Ambulatory Visit: Payer: Self-pay

## 2022-07-06 ENCOUNTER — Ambulatory Visit (INDEPENDENT_AMBULATORY_CARE_PROVIDER_SITE_OTHER): Payer: Medicaid Other | Admitting: *Deleted

## 2022-07-06 DIAGNOSIS — J309 Allergic rhinitis, unspecified: Secondary | ICD-10-CM | POA: Diagnosis not present

## 2022-07-08 ENCOUNTER — Ambulatory Visit (INDEPENDENT_AMBULATORY_CARE_PROVIDER_SITE_OTHER): Payer: Medicaid Other

## 2022-07-08 DIAGNOSIS — J309 Allergic rhinitis, unspecified: Secondary | ICD-10-CM | POA: Diagnosis not present

## 2022-07-13 ENCOUNTER — Ambulatory Visit (INDEPENDENT_AMBULATORY_CARE_PROVIDER_SITE_OTHER): Payer: Medicaid Other | Admitting: *Deleted

## 2022-07-13 DIAGNOSIS — J309 Allergic rhinitis, unspecified: Secondary | ICD-10-CM

## 2022-07-15 ENCOUNTER — Ambulatory Visit (INDEPENDENT_AMBULATORY_CARE_PROVIDER_SITE_OTHER): Payer: Medicaid Other | Admitting: *Deleted

## 2022-07-15 DIAGNOSIS — J309 Allergic rhinitis, unspecified: Secondary | ICD-10-CM

## 2022-07-16 DIAGNOSIS — F331 Major depressive disorder, recurrent, moderate: Secondary | ICD-10-CM | POA: Insufficient documentation

## 2022-07-20 ENCOUNTER — Ambulatory Visit (INDEPENDENT_AMBULATORY_CARE_PROVIDER_SITE_OTHER): Payer: Medicaid Other

## 2022-07-20 DIAGNOSIS — J309 Allergic rhinitis, unspecified: Secondary | ICD-10-CM | POA: Diagnosis not present

## 2022-07-28 ENCOUNTER — Ambulatory Visit (INDEPENDENT_AMBULATORY_CARE_PROVIDER_SITE_OTHER): Payer: Medicaid Other | Admitting: *Deleted

## 2022-07-28 DIAGNOSIS — J309 Allergic rhinitis, unspecified: Secondary | ICD-10-CM | POA: Diagnosis not present

## 2022-08-03 ENCOUNTER — Ambulatory Visit (INDEPENDENT_AMBULATORY_CARE_PROVIDER_SITE_OTHER): Payer: Medicaid Other | Admitting: *Deleted

## 2022-08-03 DIAGNOSIS — J309 Allergic rhinitis, unspecified: Secondary | ICD-10-CM | POA: Diagnosis not present

## 2022-08-07 ENCOUNTER — Ambulatory Visit (HOSPITAL_COMMUNITY)
Admission: EM | Admit: 2022-08-07 | Discharge: 2022-08-07 | Disposition: A | Discharge status: Home / Self Care | Payer: Medicaid Other | Attending: Urgent Care | Admitting: Urgent Care

## 2022-08-07 ENCOUNTER — Ambulatory Visit (INDEPENDENT_AMBULATORY_CARE_PROVIDER_SITE_OTHER): Payer: Medicaid Other

## 2022-08-07 ENCOUNTER — Encounter (HOSPITAL_COMMUNITY): Payer: Self-pay

## 2022-08-07 DIAGNOSIS — R519 Headache, unspecified: Secondary | ICD-10-CM | POA: Diagnosis not present

## 2022-08-07 DIAGNOSIS — M940 Chondrocostal junction syndrome [Tietze]: Secondary | ICD-10-CM | POA: Diagnosis not present

## 2022-08-07 DIAGNOSIS — R051 Acute cough: Secondary | ICD-10-CM | POA: Insufficient documentation

## 2022-08-07 DIAGNOSIS — E0789 Other specified disorders of thyroid: Secondary | ICD-10-CM | POA: Insufficient documentation

## 2022-08-07 DIAGNOSIS — Z1152 Encounter for screening for COVID-19: Secondary | ICD-10-CM | POA: Insufficient documentation

## 2022-08-07 DIAGNOSIS — R072 Precordial pain: Secondary | ICD-10-CM | POA: Diagnosis not present

## 2022-08-07 DIAGNOSIS — R0789 Other chest pain: Secondary | ICD-10-CM

## 2022-08-07 DIAGNOSIS — M542 Cervicalgia: Secondary | ICD-10-CM | POA: Diagnosis not present

## 2022-08-07 LAB — CBC WITH DIFFERENTIAL/PLATELET
Abs Immature Granulocytes: 0.01 10*3/uL (ref 0.00–0.07)
Basophils Absolute: 0.1 10*3/uL (ref 0.0–0.1)
Basophils Relative: 1 %
Eosinophils Absolute: 0.2 10*3/uL (ref 0.0–0.5)
Eosinophils Relative: 2 %
HCT: 40.6 % (ref 36.0–46.0)
Hemoglobin: 13.1 g/dL (ref 12.0–15.0)
Immature Granulocytes: 0 %
Lymphocytes Relative: 40 %
Lymphs Abs: 2.5 10*3/uL (ref 0.7–4.0)
MCH: 28.2 pg (ref 26.0–34.0)
MCHC: 32.3 g/dL (ref 30.0–36.0)
MCV: 87.5 fL (ref 80.0–100.0)
Monocytes Absolute: 0.4 10*3/uL (ref 0.1–1.0)
Monocytes Relative: 7 %
Neutro Abs: 3.2 10*3/uL (ref 1.7–7.7)
Neutrophils Relative %: 50 %
Platelets: 260 10*3/uL (ref 150–400)
RBC: 4.64 MIL/uL (ref 3.87–5.11)
RDW: 14.9 % (ref 11.5–15.5)
WBC: 6.3 10*3/uL (ref 4.0–10.5)
nRBC: 0 % (ref 0.0–0.2)

## 2022-08-07 LAB — SARS CORONAVIRUS 2 (TAT 6-24 HRS): SARS Coronavirus 2: NEGATIVE

## 2022-08-07 LAB — BASIC METABOLIC PANEL
Anion gap: 10 (ref 5–15)
BUN: 12 mg/dL (ref 6–20)
CO2: 23 mmol/L (ref 22–32)
Calcium: 9.7 mg/dL (ref 8.9–10.3)
Chloride: 105 mmol/L (ref 98–111)
Creatinine, Ser: 0.84 mg/dL (ref 0.44–1.00)
GFR, Estimated: >60 mL/min (ref >60)
Glucose, Bld: 86 mg/dL (ref 70–99)
Potassium: 4 mmol/L (ref 3.5–5.1)
Sodium: 138 mmol/L (ref 135–145)

## 2022-08-07 LAB — TSH: TSH: 1.372 u[IU]/mL (ref 0.350–4.500)

## 2022-08-07 MED ORDER — PREDNISONE 10 MG (21) PO TBPK: ORAL_TABLET | Freq: Every day | ORAL | 0 refills | Status: DC

## 2022-08-07 MED ORDER — ALUM & MAG HYDROXIDE-SIMETH 200-200-20 MG/5ML PO SUSP
ORAL | Status: AC
  Filled 2022-08-07: qty 30

## 2022-08-07 MED ORDER — KETOROLAC TROMETHAMINE 30 MG/ML IJ SOLN
30.0000 mg | Freq: Once | INTRAMUSCULAR | Status: AC
  Administered 2022-08-07: 30 mg via INTRAMUSCULAR

## 2022-08-07 MED ORDER — LIDOCAINE VISCOUS HCL 2 % MT SOLN
OROMUCOSAL | Status: AC
  Filled 2022-08-07: qty 15

## 2022-08-07 MED ORDER — METHOCARBAMOL 500 MG PO TABS: 500.0000 mg | ORAL_TABLET | Freq: Four times a day (QID) | ORAL | 0 refills | Status: DC | PRN

## 2022-08-07 MED ORDER — KETOROLAC TROMETHAMINE 30 MG/ML IJ SOLN
INTRAMUSCULAR | Status: AC
  Filled 2022-08-07: qty 1

## 2022-08-07 MED ORDER — LIDOCAINE VISCOUS HCL 2 % MT SOLN
15.0000 mL | Freq: Once | OROMUCOSAL | Status: AC
  Administered 2022-08-07: 15 mL via ORAL

## 2022-08-07 MED ORDER — ALUM & MAG HYDROXIDE-SIMETH 200-200-20 MG/5ML PO SUSP
30.0000 mL | Freq: Once | ORAL | Status: AC
  Administered 2022-08-07: 30 mL via ORAL

## 2022-08-07 NOTE — ED Triage Notes (Signed)
Neck pain and chest tightness for a week. Tried acid reducers with no relief.  Having back pain and fatigue as well. No known injuries or falls.

## 2022-08-07 NOTE — ED Provider Notes (Signed)
[facility]    CSN: [id number] Arrival date & time: 08/07/22  0802      History   Chief Complaint Chief Complaint  Patient presents with   Chest Pain   Neck Pain    HPI Sherri [name] is a 37 y.o. female.   Pleasant 38 year old female presents to due to concerns of chest pain and neck pain.  She states the symptoms started roughly 1 week ago.  She describes the pain as a burning sensation to her midline sternum, she also states it is reproducible to palpation.  She has had upper respiratory symptoms including a cough productive of white phlegm.  She denies a known fever.  Around the same timeframe she started developing pain in her neck, she reports the pain is located around her SCM bilaterally.  She denies a known history of thyroid disease.  She states the pain in her neck also hurts to touch, and is worse with range of motion of her head.  She denies any swollen lymph nodes.  She denies any sore throat, ear pain, sinus pressure.  She does have a mild headache.  She did take some over-the-counter Tums, assuming this was possibly GERD.  She does have a history of GERD in the past, but states this did not help with her pain level.   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    Urinary tract infection    Uterine fibroid     Patient Active Problem List   Diagnosis Date Noted   Pre-diabetes 07/23/2021   Anxiety and depression 06/24/2021   Fibromyalgia 06/24/2021   Prediabetes 07/05/2020   Gastroesophageal reflux disease 04/26/2020   Perianal rash 03/26/2020   Chronic nausea 03/26/2020   Chronic constipation 03/09/2019   Pelvic pain in female 03/09/2019   Hypertension    Genital herpes    Uterine fibroid 12/25/2013    Obesity 12/25/2013   Bacterial vaginitis 04/27/2012    Past Surgical History:  Procedure Laterality Date   CESAREAN SECTION N/A 06/05/2014   Procedure: CESAREAN SECTION;  Surgeon: Levie Heritage, DO;  Location: [facility];  Service: Obstetrics;  Laterality: N/A;   WISDOM TOOTH EXTRACTION      OB History     Gravida  1   Para  1   Term      Preterm  1   AB      Living  2      SAB      IAB      Ectopic      Multiple  1   Live Births  2            Home Medications    Prior to Admission medications   Medication Sig Start Date End Date Taking? Authorizing Provider  amitriptyline (ELAVIL) 25 MG tablet Take 2 tablets (50 mg total) by mouth at bedtime. 06/24/21 08/07/22 Yes Mayers, Cari S, PA-C  cetirizine (ZYRTEC) 10 MG tablet Take 1 tablet (10 mg total) by mouth daily. 07/01/22  Yes Padgett, Pilar Grammes, MD  Cholecalciferol (VITAMIN D3) 125 MCG (5000 UT) CAPS Take 1 capsule by mouth daily.   Yes [provider]  clindamycin (CLEOCIN T) 1 % lotion Apply to  cleansed face daily in the morning. 06/30/21  Yes   famotidine (PEPCID) 40 MG tablet Take 1 tablet (40 mg total) by mouth at bedtime. 01/27/22 08/07/22 Yes Theadora Rama Scales, PA-C  Ferrous Sulfate (IRON) 28 MG TABS Take 1 tablet by mouth daily.   Yes [provider]  fexofenadine (ALLEGRA) 180 MG tablet Take 1 tablet (180 mg total) by mouth daily. 03/08/22  Yes Wallis Bamberg, PA-C  fluticasone (FLONASE) 50 MCG/ACT nasal spray Place 1 spray into both nostrils daily. Begin by using 2 sprays in each nare daily for 3 to 5 days, then decrease to 1 spray in each nare daily. 03/08/22  Yes Wallis Bamberg, PA-C  ipratropium (ATROVENT) 0.06 % nasal spray Place 2 sprays into both nostrils 3 (three) times daily. As needed for nasal congestion, runny nose 01/27/22  Yes Theadora Rama Scales, PA-C  methocarbamol (ROBAXIN) 500 MG tablet Take 1 tablet (500 mg total) by mouth every 6 (six) hours as needed for muscle spasms.  08/07/22  Yes Johnsie Moscoso L, PA  montelukast (SINGULAIR) 10 MG tablet Take 1 tablet (10 mg total) by mouth at bedtime. 01/27/22 08/07/22 Yes Theadora Rama Scales, PA-C  pantoprazole (PROTONIX) 40 MG tablet Take 1 tablet (40 mg total) by mouth daily. 11/17/21  Yes Meredith Pel, NP  polyethylene glycol powder (GLYCOLAX/MIRALAX) 17 GM/SCOOP powder Take 17 g by mouth daily as needed for mild constipation. 09/27/21  Yes Renne Crigler, PA-C  predniSONE (STERAPRED UNI-PAK 21 TAB) 10 MG (21) TBPK tablet Take by mouth daily. Take 6 tabs by mouth daily  for 1 days, then 5 tabs for 1 days, then 4 tabs for 1 days, then 3 tabs for 1 days, 2 tabs for 1 days, then 1 tab by mouth daily for 1 days 08/07/22  Yes Bricelyn Freestone L, PA  pseudoephedrine (SUDAFED) 60 MG tablet Take 1 tablet (60 mg total) by mouth every 8 (eight) hours as needed for congestion. 03/08/22  Yes Wallis Bamberg, PA-C  SUMAtriptan (IMITREX) 100 MG tablet Take 1 tablet at earliest onset of migraine.  May repeat in 2 hours if needed.  Maximum 2 tablets in 24 hours. 04/22/22  Yes Jaffe, Adam R, DO  topiramate (TOPAMAX) 25 MG tablet Take 1 tablet (25 mg total) by mouth at bedtime. 04/22/22  Yes Jaffe, Adam R, DO  tretinoin (RETIN-A) 0.025 % cream Apply pea size amount to cleansed face every other day at bedtime. Increase to every night as tolerated. 06/30/21  Yes   triamcinolone cream (KENALOG) 0.1 % Apply to affected areas twice daily for 3 days on and 3 days off cycle or as needed. 06/30/21  Yes     Family History Family History  Problem Relation Age of Onset   Hypertension Mother    Hypertension Father    Breast cancer Maternal Grandmother        94   Colon cancer Neg Hx    Colon polyps Neg Hx    Kidney disease Neg Hx    Diabetes Neg Hx    Esophageal cancer Neg Hx    Gallbladder disease Neg Hx    Heart disease Neg Hx    Asthma Neg Hx    Stroke Neg Hx    Stomach cancer Neg Hx     Social History Social History   Tobacco Use   Smoking  status: Never    Passive exposure: Never   Smokeless tobacco: Never  Vaping Use   Vaping Use: Never used  Substance Use Topics  Alcohol use: No   Drug use: No     Allergies   Macrobid [nitrofurantoin], Augmentin [amoxicillin-pot clavulanate], Cefdinir, and Metformin and related   Review of Systems Review of Systems  Cardiovascular:  Positive for chest pain.  Musculoskeletal:  Positive for neck pain.  As per HPI   Physical Exam Triage Vital Signs ED Triage Vitals  Enc Vitals Group     BP 08/07/22 0834 133/73     Pulse Rate 08/07/22 0834 76     Resp 08/07/22 0834 18     Temp 08/07/22 0834 98.3 F (36.8 C)     Temp Source 08/07/22 0834 Oral     SpO2 08/07/22 0834 98 %     Weight 08/07/22 0834 188 lb (85.3 kg)     Height 08/07/22 0834 4\' 11"  (1.499 m)     Head Circumference --      Peak Flow --      Pain Score 08/07/22 0831 8     Pain Loc --      Pain Edu? --      Excl. in GC? --    No data found.  Updated Vital Signs BP 133/73 (BP Location: Left Arm)   Pulse 76   Temp 98.3 F (36.8 C) (Oral)   Resp 18   Ht 4\' 11"  (1.499 m)   Wt 188 lb (85.3 kg)   LMP 07/25/2022 (Approximate)   SpO2 98%   BMI 37.97 kg/m   Visual Acuity Right Eye Distance:   Left Eye Distance:   Bilateral Distance:    Right Eye Near:   Left Eye Near:    Bilateral Near:     Physical Exam Vitals and nursing note reviewed.  Constitutional:      General: She is not in acute distress.    Appearance: Normal appearance. She is well-developed. She is not ill-appearing, toxic-appearing or diaphoretic.  HENT:     Head: Normocephalic and atraumatic.     Right Ear: Tympanic membrane, ear canal and external ear normal. There is no impacted cerumen.     Left Ear: Tympanic membrane, ear canal and external ear normal. There is no impacted cerumen.     Nose: Nose normal. No congestion or rhinorrhea.     Mouth/Throat:     Mouth: Mucous membranes are moist.     Pharynx: Oropharynx is clear. No  oropharyngeal exudate or posterior oropharyngeal erythema.  Eyes:     General: No scleral icterus.       Right eye: No discharge.        Left eye: No discharge.     Extraocular Movements: Extraocular movements intact.     Conjunctiva/sclera: Conjunctivae normal.     Pupils: Pupils are equal, round, and reactive to light.  Neck:     Thyroid: Thyromegaly present.  Cardiovascular:     Rate and Rhythm: Normal rate and regular rhythm.     Heart sounds: Normal heart sounds. Heart sounds not distant. No murmur heard.    No systolic murmur is present.     No diastolic murmur is present.  Pulmonary:     Effort: Pulmonary effort is normal. No respiratory distress.     Breath sounds: Normal breath sounds. No stridor. No decreased breath sounds, wheezing, rhonchi or rales.  Chest:     Chest wall: Tenderness (lower sternum) present. No mass or edema.  Abdominal:     General: Bowel sounds are normal.     Palpations: Abdomen is soft. There is no hepatomegaly  or mass.     Tenderness: There is no abdominal tenderness.  Musculoskeletal:        General: No swelling.     Cervical back: Normal range of motion and neck supple. Tenderness (to B SCMs) present. No rigidity.     Right lower leg: No tenderness. No edema.     Left lower leg: No tenderness. No edema.  Lymphadenopathy:     Cervical: No cervical adenopathy.  Skin:    General: Skin is warm and dry.     Capillary Refill: Capillary refill takes less than 2 seconds.     Coloration: Skin is not cyanotic or pale.     Findings: No ecchymosis, erythema or rash.     Nails: There is no clubbing.  Neurological:     General: No focal deficit present.     Mental Status: She is alert and oriented to person, place, and time.  Psychiatric:        Mood and Affect: Mood normal.      UC Treatments / Results  Labs (all labs ordered are listed, but only abnormal results are displayed) Labs Reviewed  SARS CORONAVIRUS 2 (TAT 6-24 HRS)  CBC WITH  DIFFERENTIAL/PLATELET  BASIC METABOLIC PANEL  TSH    EKG   Radiology DG Chest 2 View  Result Date: 08/07/2022 CLINICAL DATA:  Chest pain worse with inspiration.  Cough. EXAM: CHEST - 2 VIEW COMPARISON:  03/08/2022 FINDINGS: Lungs are adequately inflated and otherwise clear. Cardiomediastinal silhouette and remainder of the exam is unchanged. IMPRESSION: No active cardiopulmonary disease. Electronically Signed   By: Elberta Fortis M.D.   On: 08/07/2022 09:46    Procedures ED EKG  Date/Time: 08/07/2022 9:28 AM  Performed by: Maretta Bees, PA Authorized by: Maretta Bees, PA   Previous ECG:    Previous ECG:  Compared to current   Similarity:  No change   Comparison ECG info:  05/01/20 Interpretation:    Interpretation: normal   Rate:    ECG rate:  71   ECG rate assessment: normal   Rhythm:    Rhythm: sinus rhythm   Ectopy:    Ectopy: none   ST segments:    ST segments:  Normal T waves:    T waves: normal   Q waves:    Abnormal Q-waves: not present    (including critical care time)  Medications Ordered in UC Medications  ketorolac (TORADOL) 30 MG/ML injection 30 mg (30 mg Intramuscular Given 08/07/22 0947)  alum & mag hydroxide-simeth (MAALOX/MYLANTA) 200-200-20 MG/5ML suspension 30 mL (30 mLs Oral Given 08/07/22 0947)    And  lidocaine (XYLOCAINE) 2 % viscous mouth solution 15 mL (15 mLs Oral Given 08/07/22 0947)    Initial Impression / Assessment and Plan / UC Course  I have reviewed the triage vital signs and the nursing notes.  Pertinent labs & imaging results that were available during my care of the patient were reviewed by me and considered in my medical decision making (see chart for details).     Mid-sternal chest pain -patient did not have significant relief after the GI cocktail was given.  She did have a reproducible tenderness however therefore suggesting this to be musculoskeletal cause.  EKG did not show any acute changes from her baseline  comparison. Acute cough -chest x-ray negative.  Vital signs stable and lungs clear.  Likely viral and supportive measures appropriate. Neck pain -she does have tenderness to her bilateral sternocleidomastoid muscle.  She had complete  relief of her neck discomfort after the Toradol injection in office. Thyroid fullness -noted on exam.  Her thyroid was not tender however and there were no palpable nodules.  We will draw a TSH for full evaluation. Costochondritis -EKG and chest x-ray unremarkable.  Suspect viral in nature as the cause.  Warm compresses and prednisone discussed.  Will also do trial of methocarbamol to help with her chest and neck.  Labs drawn.  ER precautions provided.   Final Clinical Impressions(s) / UC Diagnoses   Final diagnoses:  Mid sternal chest pain  Acute cough  Neck pain  Thyroid fullness  Costochondritis     Discharge Instructions      Your EKG is stable. Your chest x-ray does not reveal any acute findings. I am glad you are feeling better after the ketorolac injection. We did draw labs to further assess causes of your symptoms. We will contact you when the COVID test and labs results are available.  Please use a microwavable heat pack, or warm moist heat as a compress over your sternum. Please take the prednisone and a single dose daily with breakfast. Avoid any over-the-counter anti-inflammatory such as ibuprofen, Advil, naproxen.  Take the muscle relaxer as needed to help with your neck pain. Take with caution as this medication can make you drowsy or sleepy. Do not drive a car after taking.  Should your symptoms persist, RTC for recheck or if new symptoms or worsening symptoms occur, head to the ER.     ED Prescriptions     Medication Sig Dispense Auth. Provider   predniSONE (STERAPRED UNI-PAK 21 TAB) 10 MG (21) TBPK tablet Take by mouth daily. Take 6 tabs by mouth daily  for 1 days, then 5 tabs for 1 days, then 4 tabs for 1 days, then 3 tabs for 1  days, 2 tabs for 1 days, then 1 tab by mouth daily for 1 days 21 tablet Tomekia Helton L, PA   methocarbamol (ROBAXIN) 500 MG tablet Take 1 tablet (500 mg total) by mouth every 6 (six) hours as needed for muscle spasms. 21 tablet Gilmer Kaminsky L, [place]      PDMP not reviewed this encounter.   Maretta Bees, [place] 08/07/22 1728

## 2022-08-07 NOTE — Discharge Instructions (Addendum)
Your EKG is stable. Your chest x-ray does not reveal any acute findings. I am glad you are feeling better after the ketorolac injection. We did draw labs to further assess causes of your symptoms. We will contact you when the COVID test and labs results are available.  Please use a microwavable heat pack, or warm moist heat as a compress over your sternum. Please take the prednisone and a single dose daily with breakfast. Avoid any over-the-counter anti-inflammatory such as ibuprofen, Advil, naproxen.  Take the muscle relaxer as needed to help with your neck pain. Take with caution as this medication can make you drowsy or sleepy. Do not drive a car after taking.  Should your symptoms persist, RTC for recheck or if new symptoms or worsening symptoms occur, head to the ER.

## 2022-08-09 ENCOUNTER — Other Ambulatory Visit: Payer: Self-pay | Admitting: Neurology

## 2022-08-10 ENCOUNTER — Telehealth: Payer: Self-pay | Admitting: *Deleted

## 2022-08-10 ENCOUNTER — Ambulatory Visit (INDEPENDENT_AMBULATORY_CARE_PROVIDER_SITE_OTHER): Payer: Medicaid Other | Admitting: *Deleted

## 2022-08-10 DIAGNOSIS — J309 Allergic rhinitis, unspecified: Secondary | ICD-10-CM

## 2022-08-10 NOTE — Telephone Encounter (Signed)
Patient states that she has a lot of itching at the injection site of her allergy injections and that the hydrocortisone cream doesn't help. Is there something that can be sent in for her to help with the local itching after her injections?

## 2022-08-11 ENCOUNTER — Other Ambulatory Visit: Payer: Self-pay | Admitting: *Deleted

## 2022-08-11 MED ORDER — TRIAMCINOLONE ACETONIDE 0.5 % EX OINT: TOPICAL_OINTMENT | CUTANEOUS | 1 refills | Status: DC

## 2022-08-11 NOTE — Telephone Encounter (Signed)
Called and spoke with the patient and advised of Dr. Randell Patient recommendation. Patient verbalized understanding and agreed with plan, prescription for Triamcinolone has been sent in to requested pharmacy, patient verbalized understanding.

## 2022-08-17 ENCOUNTER — Ambulatory Visit (INDEPENDENT_AMBULATORY_CARE_PROVIDER_SITE_OTHER): Payer: Medicaid Other

## 2022-08-17 DIAGNOSIS — J309 Allergic rhinitis, unspecified: Secondary | ICD-10-CM | POA: Diagnosis not present

## 2022-08-24 ENCOUNTER — Ambulatory Visit (INDEPENDENT_AMBULATORY_CARE_PROVIDER_SITE_OTHER): Payer: Medicaid Other

## 2022-08-24 DIAGNOSIS — J309 Allergic rhinitis, unspecified: Secondary | ICD-10-CM | POA: Diagnosis not present

## 2022-08-27 ENCOUNTER — Ambulatory Visit: Payer: Medicaid Other | Admitting: Obstetrics and Gynecology

## 2022-08-31 ENCOUNTER — Ambulatory Visit (INDEPENDENT_AMBULATORY_CARE_PROVIDER_SITE_OTHER): Payer: Medicaid Other

## 2022-08-31 DIAGNOSIS — J309 Allergic rhinitis, unspecified: Secondary | ICD-10-CM | POA: Diagnosis not present

## 2022-09-07 ENCOUNTER — Institutional Professional Consult (permissible substitution) (HOSPITAL_BASED_OUTPATIENT_CLINIC_OR_DEPARTMENT_OTHER): Payer: BLUE CROSS/BLUE SHIELD | Admitting: Primary Care

## 2022-09-07 NOTE — Progress Notes (Deleted)
@Patient  ID: Sherri [name], female    DOB: [birth year], 37 y.o.   MRN: [id number]  No chief complaint on file.   Referring provider: Dot Been, FNP  HPI: 37 year old female. Never smoked. PMH  09/07/2022 Patient presents today for sleep consult.       Allergies  Allergen Reactions   Macrobid [Nitrofurantoin] Nausea And Vomiting   Augmentin [Amoxicillin-Pot Clavulanate]     Pt states it makes her sick   Cefdinir Itching   Metformin And Related Nausea And Vomiting    Immunization History  Administered Date(s) Administered   Influenza,inj,Quad PF,6+ Mos 01/07/2017   Influenza-Unspecified 12/18/2013   Janssen (J&J) SARS-COV-2 Vaccination 06/12/2019   Pneumococcal Polysaccharide-23 08/21/2020   Tdap 04/10/2014, 08/30/2016    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 60 tablet 2   cetirizine (ZYRTEC) 10 MG tablet Take 1 tablet (10 mg total) by mouth daily. 30 tablet 5   Cholecalciferol (VITAMIN D3) 125 MCG (5000 UT) CAPS Take 1 capsule by mouth daily.     clindamycin (CLEOCIN T) 1 % lotion Apply to cleansed face daily in the morning. 60 mL 1   famotidine (PEPCID) 40 MG tablet Take 1 tablet (40 mg total) by mouth at bedtime. 90 tablet 0   Ferrous Sulfate (IRON) 28 MG TABS Take 1 tablet by mouth daily.     fexofenadine (ALLEGRA) 180 MG tablet Take 1 tablet (180 mg total) by mouth daily. 90 tablet 3   fluticasone  (FLONASE) 50 MCG/ACT nasal spray Place 1 spray into both nostrils daily. Begin by using 2 sprays in each nare daily for 3 to 5 days, then decrease to 1 spray in each nare daily. 18 mL 5   ipratropium (ATROVENT) 0.06 % nasal spray Place 2 sprays into both nostrils 3 (three) times daily. As needed for nasal congestion, runny nose 15 mL 2   methocarbamol (ROBAXIN) 500 MG tablet Take 1 tablet (500 mg total) by mouth every 6 (six) hours as needed for muscle spasms. 21 tablet 0   montelukast (SINGULAIR) 10 MG tablet Take 1 tablet (10 mg total) by mouth at bedtime. 90 tablet 0   ondansetron (ZOFRAN-ODT) 4 MG disintegrating tablet DISSOLVE TAKE 1 TABLET BY MOUTH EVERY 8 HOURS AS NEEDED FOR NAUSEA AND VOMITING 20 tablet 4   pantoprazole (PROTONIX) 40 MG tablet Take 1 tablet (40 mg total) by mouth daily. 30 tablet 6   polyethylene glycol powder (GLYCOLAX/MIRALAX) 17 GM/SCOOP powder Take 17 g by mouth daily as needed for mild constipation. 238 g 0   predniSONE (STERAPRED UNI-PAK 21 TAB) 10 MG (21) TBPK tablet Take by mouth daily. Take 6 tabs by mouth daily  for 1 days, then 5 tabs for 1 days, then 4 tabs for 1 days, then 3 tabs for 1 days, 2 tabs for 1 days, then 1 tab by mouth daily  for 1 days 21 tablet 0   pseudoephedrine (SUDAFED) 60 MG tablet Take 1 tablet (60 mg total) by mouth every 8 (eight) hours as needed for congestion. 30 tablet 0   SUMAtriptan (IMITREX) 100 MG tablet Take 1 tablet at earliest onset of migraine.  May repeat in 2 hours if needed.  Maximum 2 tablets in 24 hours. 10 tablet 5   topiramate (TOPAMAX) 25 MG tablet Take 1 tablet (25 mg total) by mouth at bedtime. 30 tablet 5   tretinoin (RETIN-A) 0.025 % cream Apply pea size amount to cleansed face every other day at bedtime. Increase to every night as tolerated. 20 g 0   triamcinolone cream (KENALOG) 0.1 % Apply to affected areas twice daily for 3 days on and 3 days off cycle or as needed. 80 g 0   triamcinolone ointment (KENALOG) 0.5 % 1  application 2 times daily as needed to itchy/ inflamed areas from allergy injections. 30 g 1   No facility-administered medications prior to visit.      Review of Systems  Review of Systems   Physical Exam  There were no vitals taken for this visit. Physical Exam   Lab Results:  CBC    Component Value Date/Time   WBC 6.3 08/07/2022 0909   RBC 4.64 08/07/2022 0909   HGB 13.1 08/07/2022 0909   HGB 13.3 01/21/2021 1525   HCT 40.6 08/07/2022 0909   HCT 40.3 01/21/2021 1525   PLT 260 08/07/2022 0909   PLT 251 01/21/2021 1525   MCV 87.5 08/07/2022 0909   MCV 88 01/21/2021 1525   MCH 28.2 08/07/2022 0909   MCHC 32.3 08/07/2022 0909   RDW 14.9 08/07/2022 0909   RDW 14.4 01/21/2021 1525   LYMPHSABS 2.5 08/07/2022 0909   LYMPHSABS 2.8 07/14/2019 0950   MONOABS 0.4 08/07/2022 0909   EOSABS 0.2 08/07/2022 0909   EOSABS 0.2 07/14/2019 0950   BASOSABS 0.1 08/07/2022 0909   BASOSABS 0.0 07/14/2019 0950    BMET    Component Value Date/Time   NA 138 08/07/2022 0909   NA 139 01/21/2021 1525   K 4.0 08/07/2022 0909   CL 105 08/07/2022 0909   CO2 23 08/07/2022 0909   GLUCOSE 86 08/07/2022 0909   BUN 12 08/07/2022 0909   BUN 10 01/21/2021 1525   CREATININE 0.84 08/07/2022 0909   CREATININE 0.65 12/19/2010 1030   CALCIUM 9.7 08/07/2022 0909   GFRNONAA >60 08/07/2022 0909   GFRAA 107 08/10/2019 1450    BNP No results found for: "BNP"  ProBNP No results found for: "PROBNP"  Imaging: No results found.   Assessment & Plan:   No problem-specific Assessment & Plan notes found for this encounter.     Glenford Bayley, NP 09/07/2022

## 2022-09-08 ENCOUNTER — Ambulatory Visit (INDEPENDENT_AMBULATORY_CARE_PROVIDER_SITE_OTHER): Payer: Medicaid Other | Admitting: *Deleted

## 2022-09-08 DIAGNOSIS — J309 Allergic rhinitis, unspecified: Secondary | ICD-10-CM | POA: Diagnosis not present

## 2022-09-11 ENCOUNTER — Other Ambulatory Visit: Payer: Self-pay

## 2022-09-11 ENCOUNTER — Ambulatory Visit
Admission: EM | Admit: 2022-09-11 | Discharge: 2022-09-11 | Disposition: A | Discharge status: Home / Self Care | Payer: Medicaid Other | Attending: Family Medicine | Admitting: Family Medicine

## 2022-09-11 DIAGNOSIS — R52 Pain, unspecified: Secondary | ICD-10-CM

## 2022-09-11 DIAGNOSIS — N760 Acute vaginitis: Secondary | ICD-10-CM | POA: Diagnosis not present

## 2022-09-11 LAB — POCT URINALYSIS DIP (MANUAL ENTRY)
Bilirubin, UA: NEGATIVE
Blood, UA: NEGATIVE
Glucose, UA: NEGATIVE mg/dL
Ketones, POC UA: NEGATIVE mg/dL
Leukocytes, UA: NEGATIVE
Nitrite, UA: NEGATIVE
Protein Ur, POC: NEGATIVE mg/dL
Spec Grav, UA: 1.025 (ref 1.010–1.025)
Urobilinogen, UA: 0.2 E.U./dL
pH, UA: 6 (ref 5.0–8.0)

## 2022-09-11 LAB — POCT URINE PREGNANCY: Preg Test, Ur: NEGATIVE

## 2022-09-11 MED ORDER — NAPROXEN 375 MG PO TABS
375.0000 mg | ORAL_TABLET | Freq: Two times a day (BID) | ORAL | 0 refills | Status: DC | PRN
Start: 2022-09-11 — End: 2023-03-11

## 2022-09-11 MED ORDER — FLUCONAZOLE 150 MG PO TABS
150.0000 mg | ORAL_TABLET | ORAL | 0 refills | Status: DC | PRN
Start: 2022-09-11 — End: 2022-10-17

## 2022-09-11 MED ORDER — METHOCARBAMOL 500 MG PO TABS
500.0000 mg | ORAL_TABLET | Freq: Two times a day (BID) | ORAL | 0 refills | Status: DC
Start: 2022-09-11 — End: 2023-04-14

## 2022-09-11 NOTE — Discharge Instructions (Addendum)
You do not have a UTI today.  Back pain and neck pain are likely related to your fibromyalgia.  I have prescribed you a muscle relaxer along with anti-inflammatory to take as needed.  Also recommend getting a heating pad, massage can also help with pain associated with fibromyalgia.  As discussed there is not a cure however there are some recommendations that can help with pain management.

## 2022-09-11 NOTE — ED Provider Notes (Signed)
Renaldo Fiddler    CSN: [id number] Arrival date & time: 09/11/22  3086      History   Chief Complaint Chief Complaint  Patient presents with   Back Pain   UTI Symptoms    HPI Sherri [name] is a 37 y.o. female.   HPI Patient with a history of fibromyalgia, hypertension, type 2 diabetes,  presents today with a complaint of low back pain and complaints of vaginal odor x 1 week.  Reports her menstrual period just ended.  She endorses some dysuria and signs for possible UTI.  Denies any nausea, vomiting, chills, flank pain.  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    Urinary tract infection    Uterine fibroid     Patient Active Problem List   Diagnosis Date Noted   Moderate episode of recurrent major depressive disorder (HCC) 07/16/2022   Pre-diabetes 07/23/2021   Anxiety and depression 06/24/2021   Fibromyalgia 06/24/2021   Chronic sinusitis 12/26/2020   Sinus pressure 12/26/2020   Prediabetes 07/05/2020   Gastroesophageal reflux disease 04/26/2020   Perianal rash 03/26/2020   Chronic nausea 03/26/2020   Chronic constipation 03/09/2019   Pelvic pain in female 03/09/2019   Hypertension    Genital herpes    Uterine fibroid 12/25/2013   Obesity 12/25/2013   Bacterial vaginitis 04/27/2012    Past Surgical History:  Procedure Laterality Date   CESAREAN SECTION N/A 06/05/2014   Procedure: CESAREAN SECTION;  Surgeon: Levie Heritage, DO;  Location: [facility];  Service: Obstetrics;  Laterality: N/A;   WISDOM TOOTH EXTRACTION      OB History     Gravida  1   Para  1   Term      Preterm  1   AB      Living  2      SAB      IAB      Ectopic      Multiple  1   Live Births  2            Home Medications    Prior to Admission medications   Medication  Sig Start Date End Date Taking? Authorizing Provider  amitriptyline (ELAVIL) 50 MG tablet Take 50 mg by mouth at bedtime. 06/19/22  Yes [provider]  amitriptyline (ELAVIL) 75 MG tablet Take by mouth. 08/28/22 11/26/22 Yes [provider]  amLODipine (NORVASC) 5 MG tablet Take 1 tablet by mouth daily. 08/11/22  Yes [provider]  Blood Pressure Monitoring (BLOOD PRESSURE KIT) KIT Use to check blood pressure twice daily and records BP logs nd bring to your office visits 08/03/22  Yes [provider]  cetirizine (ZYRTEC) 10 MG tablet Take 1 tablet (10 mg total) by mouth daily. 07/01/22  Yes Padgett, Pilar Grammes, MD  ergocalciferol (VITAMIN D2) 1.25 MG (50000 UT) capsule Take by mouth. 08/11/22 11/09/22 Yes [provider]  fosfomycin (MONUROL) 3 g PACK SMARTSIG:3 Gram(s) By Mouth Once 06/16/22  Yes [provider]  gemfibrozil (LOPID) 600 MG tablet Take by mouth. 06/01/22  Yes [provider]  hydrOXYzine (ATARAX) 25 MG tablet TAKE 1 TABLET BY MOUTH THREE TIMES A DAY AS NEEDED FOR ANXIETY FOR UP TO 90 DAYS 08/11/22  Yes [provider]  metroNIDAZOLE (FLAGYL)  500 MG tablet Take 500 mg by mouth 2 (two) times daily. 08/10/22  Yes [provider]  metroNIDAZOLE (METROGEL) 0.75 % vaginal gel Place vaginally at bedtime. 07/07/22  Yes [provider]  Na Sulfate-K Sulfate-Mg Sulf 17.5-3.13-1.6 GM/177ML SOLN See admin instructions. 04/10/22  Yes [provider]  ondansetron (ZOFRAN) 4 MG tablet PLEASE SEE ATTACHED FOR DETAILED DIRECTIONS 07/08/22  Yes [provider]  ondansetron (ZOFRAN-ODT) 4 MG disintegrating tablet Take by mouth. 08/27/22  Yes [provider]  predniSONE (DELTASONE) 10 MG tablet Take by mouth. 08/07/22  Yes [provider]  Vitamin D, Ergocalciferol, (DRISDOL) 1.25 MG (50000 UNIT) CAPS capsule Take by mouth. 07/07/22  Yes [provider]  amitriptyline (ELAVIL) 25 MG  tablet Take 2 tablets (50 mg total) by mouth at bedtime. 06/24/21 08/07/22  Mayers, Kasandra Knudsen, PA-C  Cholecalciferol (VITAMIN D3) 125 MCG (5000 UT) CAPS Take 1 capsule by mouth daily.    [provider]  clindamycin (CLEOCIN T) 1 % lotion Apply to cleansed face daily in the morning. 06/30/21     famotidine (PEPCID) 40 MG tablet Take 1 tablet (40 mg total) by mouth at bedtime. 01/27/22 08/07/22  Theadora Rama Scales, PA-C  Ferrous Sulfate (IRON) 28 MG TABS Take 1 tablet by mouth daily.    [provider]  fexofenadine (ALLEGRA) 180 MG tablet Take 1 tablet (180 mg total) by mouth daily. 03/08/22   Wallis Bamberg, PA-C  fluconazole (DIFLUCAN) 150 MG tablet Take 1 tablet (150 mg total) by mouth every three (3) days as needed. 09/11/22  Yes Bing Neighbors, NP  fluticasone (FLONASE) 50 MCG/ACT nasal spray Place 1 spray into both nostrils daily. Begin by using 2 sprays in each nare daily for 3 to 5 days, then decrease to 1 spray in each nare daily. 03/08/22   Wallis Bamberg, PA-C  ipratropium (ATROVENT) 0.06 % nasal spray Place 2 sprays into both nostrils 3 (three) times daily. As needed for nasal congestion, runny nose 01/27/22   Theadora Rama Scales, PA-C  methocarbamol (ROBAXIN) 500 MG tablet Take 1 tablet (500 mg total) by mouth 2 (two) times daily. 09/11/22  Yes Bing Neighbors, NP  montelukast (SINGULAIR) 10 MG tablet Take 1 tablet (10 mg total) by mouth at bedtime. 01/27/22 08/07/22  Theadora Rama Scales, PA-C  naproxen (NAPROSYN) 375 MG tablet Take 1 tablet (375 mg total) by mouth 2 (two) times daily as needed. 09/11/22  Yes Bing Neighbors, NP  ondansetron (ZOFRAN-ODT) 4 MG disintegrating tablet DISSOLVE TAKE 1 TABLET BY MOUTH EVERY 8 HOURS AS NEEDED FOR NAUSEA AND VOMITING 08/11/22   Everlena Cooper, Adam R, DO  pantoprazole (PROTONIX) 40 MG tablet Take 1 tablet (40 mg total) by mouth daily. 11/17/21   Meredith Pel, NP  polyethylene glycol powder (GLYCOLAX/MIRALAX) 17 GM/SCOOP powder Take 17 g by  mouth daily as needed for mild constipation. 09/27/21   Renne Crigler, PA-C  predniSONE (STERAPRED UNI-PAK 21 TAB) 10 MG (21) TBPK tablet Take by mouth daily. Take 6 tabs by mouth daily  for 1 days, then 5 tabs for 1 days, then 4 tabs for 1 days, then 3 tabs for 1 days, 2 tabs for 1 days, then 1 tab by mouth daily for 1 days 08/07/22   Verlon Setting, Whitney L, PA  pseudoephedrine (SUDAFED) 60 MG tablet Take 1 tablet (60 mg total) by mouth every 8 (eight) hours as needed for congestion. 03/08/22   Wallis Bamberg, PA-C  SUMAtriptan (IMITREX) 100 MG tablet Take  1 tablet at earliest onset of migraine.  May repeat in 2 hours if needed.  Maximum 2 tablets in 24 hours. 04/22/22   Drema Dallas, DO  topiramate (TOPAMAX) 25 MG tablet Take 1 tablet (25 mg total) by mouth at bedtime. 04/22/22   Drema Dallas, DO  tretinoin (RETIN-A) 0.025 % cream Apply pea size amount to cleansed face every other day at bedtime. Increase to every night as tolerated. 06/30/21     triamcinolone cream (KENALOG) 0.1 % Apply to affected areas twice daily for 3 days on and 3 days off cycle or as needed. 06/30/21     triamcinolone ointment (KENALOG) 0.5 % 1 application 2 times daily as needed to itchy/ inflamed areas from allergy injections. 08/11/22   Marcelyn Bruins, MD    Family History Family History  Problem Relation Age of Onset   Hypertension Mother    Hypertension Father    Breast cancer Maternal Grandmother        22   Colon cancer Neg Hx    Colon polyps Neg Hx    Kidney disease Neg Hx    Diabetes Neg Hx    Esophageal cancer Neg Hx    Gallbladder disease Neg Hx    Heart disease Neg Hx    Asthma Neg Hx    Stroke Neg Hx    Stomach cancer Neg Hx     Social History Social History   Tobacco Use   Smoking status: Never    Passive exposure: Never   Smokeless tobacco: Never  Vaping Use   Vaping status: Never Used  Substance Use Topics   Alcohol use: No   Drug use: No     Allergies   Macrobid [nitrofurantoin],  Augmentin [amoxicillin-pot clavulanate], Cefdinir, and Metformin and related   Review of Systems Review of Systems Pertinent negatives listed in HPI   Physical Exam Triage Vital Signs ED Triage Vitals  Encounter Vitals Group     BP 09/11/22 1106 134/83     Systolic BP Percentile --      Diastolic BP Percentile --      Pulse Rate 09/11/22 1106 73     Resp 09/11/22 1106 18     Temp 09/11/22 1106 98.4 F (36.9 C)     Temp Source 09/11/22 1106 Oral     SpO2 09/11/22 1106 98 %     Weight 09/11/22 1104 177 lb (80.3 kg)     Height 09/11/22 1104 4\' 11"  (1.499 m)     Head Circumference --      Peak Flow --      Pain Score 09/11/22 1103 10     Pain Loc --      Pain Education --      Exclude from Growth Chart --    No data found.  Updated Vital Signs BP 134/83 (BP Location: Left Arm)   Pulse 73   Temp 98.4 F (36.9 C) (Oral)   Resp 18   Ht 4\' 11"  (1.499 m)   Wt 177 lb (80.3 kg)   LMP 09/04/2022 (Exact Date)   SpO2 98%   BMI 35.75 kg/m   Visual Acuity Right Eye Distance:   Left Eye Distance:   Bilateral Distance:    Right Eye Near:   Left Eye Near:    Bilateral Near:     Physical Exam Vitals reviewed.  Constitutional:      Appearance: Normal appearance.  HENT:     Head: Normocephalic and atraumatic.  Eyes:     Extraocular Movements: Extraocular movements intact.     Pupils: Pupils are equal, round, and reactive to light.  Cardiovascular:     Rate and Rhythm: Normal rate and regular rhythm.  Pulmonary:     Effort: Pulmonary effort is normal.     Breath sounds: Normal breath sounds.  Abdominal:     Palpations: Abdomen is soft.  Skin:    General: Skin is warm and dry.     Capillary Refill: Capillary refill takes less than 2 seconds.  Neurological:     General: No focal deficit present.     Mental Status: She is alert.      UC Treatments / Results  Labs (all labs ordered are listed, but only abnormal results are displayed) Labs Reviewed  POCT  URINALYSIS DIP (MANUAL ENTRY) - Abnormal; Notable for the following components:      Result Value   Clarity, UA hazy (*)    All other components within normal limits  POCT URINE PREGNANCY  CERVICOVAGINAL ANCILLARY ONLY    EKG   Radiology No results found.  Procedures Procedures (including critical care time)  Medications Ordered in UC Medications - No data to display  Initial Impression / Assessment and Plan / UC Course  I have reviewed the triage vital signs and the nursing notes.  Pertinent labs & imaging results that were available during my care of the patient were reviewed by me and considered in my medical decision making (see chart for details).    Vaginitis with generalized bodyaches, UA negative for UTI.  Discussed with patient a lot of her symptoms related to arthralgias and myalgias are secondary to fibromyalgia.  Given symptoms of vaginal itching start Diflucan 1 tablet today may repeat in 3 days if needed.  Cytology pending.  Urine hCG is negative.  For generalized bodyaches prescribed naproxen and methocarbamol.  Patient will take as directed.  Return precautions given if symptoms worsen or do not improve. Final Clinical Impressions(s) / UC Diagnoses   Final diagnoses:  Vaginitis and vulvovaginitis  Generalized body aches     Discharge Instructions      You do not have a UTI today.  Back pain and neck pain are likely related to your fibromyalgia.  I have prescribed you a muscle relaxer along with anti-inflammatory to take as needed.  Also recommend getting a heating pad, massage can also help with pain associated with fibromyalgia.  As discussed there is not a cure however there are some recommendations that can help with pain management.     ED Prescriptions     Medication Sig Dispense Auth. Provider   naproxen (NAPROSYN) 375 MG tablet Take 1 tablet (375 mg total) by mouth 2 (two) times daily as needed. 20 tablet Bing Neighbors, NP   methocarbamol  (ROBAXIN) 500 MG tablet Take 1 tablet (500 mg total) by mouth 2 (two) times daily. 20 tablet Bing Neighbors, NP   fluconazole (DIFLUCAN) 150 MG tablet Take 1 tablet (150 mg total) by mouth every three (3) days as needed. 2 tablet Bing Neighbors, NP      PDMP not reviewed this encounter.   Bing Neighbors, NP 09/13/22 [phone]

## 2022-09-11 NOTE — ED Triage Notes (Signed)
Lower mid back pain with dysuria that started about a week ago. Having some "vaginal odor as well". Some vaginal discharge as well. No concern for STI known. "I did just get off my period". No injury known.

## 2022-09-14 ENCOUNTER — Ambulatory Visit (INDEPENDENT_AMBULATORY_CARE_PROVIDER_SITE_OTHER): Payer: Medicaid Other | Admitting: *Deleted

## 2022-09-14 DIAGNOSIS — J309 Allergic rhinitis, unspecified: Secondary | ICD-10-CM

## 2022-09-14 LAB — CERVICOVAGINAL ANCILLARY ONLY
Bacterial Vaginitis (gardnerella): POSITIVE — AB
Candida Glabrata: NEGATIVE
Candida Vaginitis: NEGATIVE
Chlamydia: NEGATIVE
Comment: NEGATIVE
Comment: NEGATIVE
Comment: NEGATIVE
Comment: NEGATIVE
Comment: NEGATIVE
Comment: NORMAL
Neisseria Gonorrhea: NEGATIVE
Trichomonas: NEGATIVE

## 2022-09-15 ENCOUNTER — Telehealth (HOSPITAL_COMMUNITY): Payer: Self-pay | Admitting: Emergency Medicine

## 2022-09-15 MED ORDER — METRONIDAZOLE 0.75 % VA GEL: 1.0000 | Freq: Every day | VAGINAL | 0 refills | Status: AC

## 2022-09-21 ENCOUNTER — Telehealth: Payer: Self-pay

## 2022-09-21 ENCOUNTER — Ambulatory Visit (INDEPENDENT_AMBULATORY_CARE_PROVIDER_SITE_OTHER): Payer: Medicaid Other

## 2022-09-21 DIAGNOSIS — J309 Allergic rhinitis, unspecified: Secondary | ICD-10-CM

## 2022-09-21 NOTE — Telephone Encounter (Signed)
Patient in office today and stated the zyrtec has not been helping with her shots. She has been taking it twice daily. I gave her a sample of xyzal to try and put hydrocortisone cream on her arms before she left the office.

## 2022-09-22 ENCOUNTER — Other Ambulatory Visit: Payer: Self-pay | Admitting: Podiatry

## 2022-09-22 ENCOUNTER — Ambulatory Visit (INDEPENDENT_AMBULATORY_CARE_PROVIDER_SITE_OTHER): Payer: Medicaid Other | Admitting: Podiatry

## 2022-09-22 ENCOUNTER — Ambulatory Visit (INDEPENDENT_AMBULATORY_CARE_PROVIDER_SITE_OTHER): Payer: Medicaid Other

## 2022-09-22 DIAGNOSIS — M722 Plantar fascial fibromatosis: Secondary | ICD-10-CM

## 2022-09-22 MED ORDER — MELOXICAM 15 MG PO TABS: 15.0000 mg | ORAL_TABLET | Freq: Every day | ORAL | 3 refills | Status: DC

## 2022-09-22 NOTE — Patient Instructions (Signed)

## 2022-09-22 NOTE — Progress Notes (Signed)
  Subjective:  Patient ID: Sherri [name], female    DOB: 1985/07/22,  MRN: [id number]  Chief Complaint  Patient presents with   Foot Pain    Pt states she has had bilateral foot pain for about 1-2 months now she says that her left one hurts worse than her right it hurts the most when she applies pressure. She has been taking Tylenol and applying ice packs     37 y.o. female presents with the above complaint. History confirmed with patient.   Objective:  Physical Exam: warm, good capillary refill, no trophic changes or ulcerative lesions, normal DP and PT pulses, and normal sensory exam. Left Foot: point tenderness over the heel pad Right Foot: point tenderness over the heel pad  No images are attached to the encounter.  Radiographs: Multiple views x-ray of both feet: no fracture, dislocation, swelling or degenerative changes noted and plantar calcaneal spur Assessment:   1. Bilateral plantar fasciitis      Plan:  Patient was evaluated and treated and all questions answered.  Discussed the etiology and treatment options for plantar fasciitis including stretching, formal physical therapy, supportive shoegears such as a running shoe or sneaker, pre fabricated orthoses, injection therapy, and oral medications. We also discussed the role of surgical treatment of this for patients who do not improve after exhausting non-surgical treatment options.   -XR reviewed with patient -Educated patient on stretching and icing of the affected limb -Injection delivered to the plantar fascia of both feet. -Rx for meloxicam. Educated on use, risks and benefits of the medication -I do think she would benefit from a multi density longitudinal arch support.  Power steps were dispensed today.  She will use these in her work shoes.  After sterile prep with povidone-iodine solution and alcohol, the bilateral heel was injected with 0.5cc 2% xylocaine plain, 0.5cc 0.5% marcaine plain, 5mg  triamcinolone  acetonide, and 2mg  dexamethasone was injected along the medial plantar fascia at the insertion on the plantar calcaneus. The patient tolerated the procedure well without complication.  Return in about 6 weeks (around 11/03/2022) for recheck plantar fasciitis.

## 2022-09-22 NOTE — Progress Notes (Deleted)
NEUROLOGY FOLLOW UP OFFICE NOTE  Sherri [name] [id number]  Assessment/Plan:   Chronic migraine without aura, without status migrainosus, not intractable   Migraine prevention:  *** Migraine rescue:  Sumatriptan100mg .  Zofran ODT 4mg . *** Limit use of pain relievers to no more than 2 days out of week to prevent risk of rebound or medication-overuse headache. Keep headache diary Follow up ***       Subjective:  Sherri [name] is a 37 year old female with DM II and iron-deficiency anemia who follows up for migraines.  UPDATE: Started topiramate.   *** Intensity:  *** Duration:  *** Frequency:  *** Frequency of abortive medication: *** Current NSAIDS/analgesics:  none Current triptans:  sumatriptan 100mg  Current ergotamine:  none Current anti-emetic:  Zofran ODT 4mg  Current muscle relaxants:  none Current Antihypertensive medications:  none Current Antidepressant medications:  amitriptyline 50mg  QHS (to help sleep) Current Anticonvulsant medications:  topiramate 25mg  at bedtime *** Current anti-CGRP:  none Current Antihistamines/Decongestants:  Flonase, Allegra, Sudafed Other therapy:  none Birth control:  none     Caffeine:  No coffee or colas Diet:  Sometimes ginger ale.  Trying to increase water intake.  Eats fast food.   Exercise:  no Depression:  no; Anxiety:  yes Sleep hygiene:  good with amitriptyline.  Gets 6-7 hours.  Wakes up tired.  She has been told that she snores in the past.    HISTORY:  Started having headaches starting around September 2023.  No prior history.  No known preceding trigger (illness, head injury, new medication) except for possibly stress.  They are moderate but can be severe.  It is a pounding pain across the forehead.  No neck pain.  Associated with nausea, photophobia, phonophobia, osmophobia.  No associated vomiting, visual disturbance or unilateral numbness or weakness.  No preceding aura or prodrome.  Often wakes up with  headache.  They often last all day.  They occur every other day.  Usually will take a pain reliever no more than twice a week.     They were frequent in September and October, requiring 2 ED/Urgent Care visits.  CT head on 11/30/2021 personally reviewed was normal.     She also has chronic abdominal pain with nausea, vomiting and constipation.  GI workup has been negative.     Past NSAIDS/analgesics:  acetaminophen, Excedrin, ibuprofen, diclofenac, naproxen Past abortive triptans:  none Past abortive ergotamine:  none Past muscle relaxants:  Flexeril, Robaxin Past anti-emetic:  promethazine Past antihypertensive medications:  none Past antidepressant medications:  duloxetine, mirtazapine Past anticonvulsant medications:  none Past anti-CGRP:  none Past vitamins/Herbal/Supplements:  none Past antihistamines/decongestants:  Zyrtec Other past therapies:  none    Family history of headache:  no. No family history of cerebral aneurysms.    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 60 tablet 2   amitriptyline (ELAVIL) 50 MG tablet Take 50 mg by mouth at bedtime.     amitriptyline (ELAVIL) 75 MG tablet Take by mouth.  amLODipine (NORVASC) 5 MG tablet Take 1 tablet by mouth daily.     Blood Pressure Monitoring (BLOOD PRESSURE KIT) KIT Use to check blood pressure twice daily and records BP logs nd bring to your office visits     cetirizine (ZYRTEC) 10 MG tablet Take 1 tablet (10 mg total) by mouth daily. 30 tablet 5   Cholecalciferol (VITAMIN  D3) 125 MCG (5000 UT) CAPS Take 1 capsule by mouth daily.     clindamycin (CLEOCIN T) 1 % lotion Apply to cleansed face daily in the morning. 60 mL 1   ergocalciferol (VITAMIN D2) 1.25 MG (50000 UT) capsule Take by mouth.     famotidine (PEPCID) 40 MG tablet Take 1 tablet (40 mg total) by mouth at bedtime. 90 tablet 0   Ferrous Sulfate (IRON) 28 MG TABS Take 1 tablet by mouth daily.     fexofenadine (ALLEGRA) 180 MG tablet Take 1 tablet (180 mg total) by mouth daily. 90 tablet 3   fluconazole (DIFLUCAN) 150 MG tablet Take 1 tablet (150 mg total) by mouth every three (3) days as needed. 2 tablet 0   fluticasone (FLONASE) 50 MCG/ACT nasal spray Place 1 spray into both nostrils daily. Begin by using 2 sprays in each nare daily for 3 to 5 days, then decrease to 1 spray in each nare daily. 18 mL 5   fosfomycin (MONUROL) 3 g PACK SMARTSIG:3 Gram(s) By Mouth Once     gemfibrozil (LOPID) 600 MG tablet Take by mouth.     hydrOXYzine (ATARAX) 25 MG tablet TAKE 1 TABLET BY MOUTH THREE TIMES A DAY AS NEEDED FOR ANXIETY FOR UP TO 90 DAYS     ipratropium (ATROVENT) 0.06 % nasal spray Place 2 sprays into both nostrils 3 (three) times daily. As needed for nasal congestion, runny nose 15 mL 2   methocarbamol (ROBAXIN) 500 MG tablet Take 1 tablet (500 mg total) by mouth 2 (two) times daily. 20 tablet 0   montelukast (SINGULAIR) 10 MG tablet Take 1 tablet (10 mg total) by mouth at bedtime. 90 tablet 0   Na Sulfate-K Sulfate-Mg Sulf 17.5-3.13-1.6 GM/177ML SOLN See admin instructions.     naproxen (NAPROSYN) 375 MG tablet Take 1 tablet (375 mg total) by mouth 2 (two) times daily as needed. 20 tablet 0   ondansetron (ZOFRAN) 4 MG tablet PLEASE SEE ATTACHED FOR DETAILED DIRECTIONS     ondansetron (ZOFRAN-ODT) 4 MG disintegrating tablet DISSOLVE TAKE 1 TABLET BY MOUTH EVERY 8 HOURS AS NEEDED FOR NAUSEA AND VOMITING 20 tablet 4   ondansetron (ZOFRAN-ODT) 4 MG disintegrating tablet Take by mouth.     pantoprazole  (PROTONIX) 40 MG tablet Take 1 tablet (40 mg total) by mouth daily. 30 tablet 6   polyethylene glycol powder (GLYCOLAX/MIRALAX) 17 GM/SCOOP powder Take 17 g by mouth daily as needed for mild constipation. 238 g 0   predniSONE (DELTASONE) 10 MG tablet Take by mouth.     predniSONE (STERAPRED UNI-PAK 21 TAB) 10 MG (21) TBPK tablet Take by mouth daily. Take 6 tabs by mouth daily  for 1 days, then 5 tabs for 1 days, then 4 tabs for 1 days, then 3 tabs for 1 days, 2 tabs for 1 days, then 1 tab by mouth daily for 1 days 21 tablet 0   pseudoephedrine (SUDAFED) 60 MG tablet Take 1 tablet (60 mg total) by mouth every 8 (eight) hours as needed for congestion. 30 tablet 0   SUMAtriptan (IMITREX) 100 MG tablet Take 1 tablet  at earliest onset of migraine.  May repeat in 2 hours if needed.  Maximum 2 tablets in 24 hours. 10 tablet 5   topiramate (TOPAMAX) 25 MG tablet Take 1 tablet (25 mg total) by mouth at bedtime. 30 tablet 5   tretinoin (RETIN-A) 0.025 % cream Apply pea size amount to cleansed face every other day at bedtime. Increase to every night as tolerated. 20 g 0   triamcinolone cream (KENALOG) 0.1 % Apply to affected areas twice daily for 3 days on and 3 days off cycle or as needed. 80 g 0   triamcinolone ointment (KENALOG) 0.5 % 1 application 2 times daily as needed to itchy/ inflamed areas from allergy injections. 30 g 1   Vitamin D, Ergocalciferol, (DRISDOL) 1.25 MG (50000 UNIT) CAPS capsule Take by mouth.     No current facility-administered medications on file prior to visit.    ALLERGIES: Allergies  Allergen Reactions   Macrobid [Nitrofurantoin] Nausea And Vomiting   Augmentin [Amoxicillin-Pot Clavulanate]     Pt states it makes her sick   Cefdinir Itching   Metformin And Related Nausea And Vomiting    FAMILY HISTORY: Family History  Problem Relation Age of Onset   Hypertension Mother    Hypertension Father    Breast cancer Maternal Grandmother        60   Colon cancer Neg Hx     Colon polyps Neg Hx    Kidney disease Neg Hx    Diabetes Neg Hx    Esophageal cancer Neg Hx    Gallbladder disease Neg Hx    Heart disease Neg Hx    Asthma Neg Hx    Stroke Neg Hx    Stomach cancer Neg Hx       Objective:  *** General: No acute distress.  Patient appears well-groomed.   Head:  Normocephalic/atraumatic Eyes:  Fundi examined but not visualized Neck: supple, no paraspinal tenderness, full range of motion Heart:  Regular rate and rhythm Neurological Exam: ***   Shon Millet, DO  CC: ***

## 2022-09-23 ENCOUNTER — Ambulatory Visit: Payer: Medicaid Other | Admitting: Neurology

## 2022-09-24 DIAGNOSIS — J301 Allergic rhinitis due to pollen: Secondary | ICD-10-CM

## 2022-09-24 NOTE — Progress Notes (Signed)
VIALS EXP 09-24-23

## 2022-09-25 DIAGNOSIS — J3089 Other allergic rhinitis: Secondary | ICD-10-CM

## 2022-09-28 ENCOUNTER — Ambulatory Visit (INDEPENDENT_AMBULATORY_CARE_PROVIDER_SITE_OTHER): Payer: Medicaid Other

## 2022-09-28 DIAGNOSIS — J309 Allergic rhinitis, unspecified: Secondary | ICD-10-CM | POA: Diagnosis not present

## 2022-10-05 ENCOUNTER — Ambulatory Visit (INDEPENDENT_AMBULATORY_CARE_PROVIDER_SITE_OTHER): Payer: Medicaid Other

## 2022-10-05 DIAGNOSIS — J309 Allergic rhinitis, unspecified: Secondary | ICD-10-CM | POA: Diagnosis not present

## 2022-10-11 NOTE — Progress Notes (Deleted)
@Patient  ID: Sherri [name], female    DOB: 1986/01/24, 37 y.o.   MRN: [id number]  No chief complaint on file.   Referring provider: Dot Been, FNP  HPI: 37 year old female, never smoked. PMH significant for HTN, chronic sinusitis, GERD, depression/anxiety, fibromyalgia, pre-diabetes, obesity.   10/12/2022 Patient presents today for sleep consult.       Allergies  Allergen Reactions   Macrobid [Nitrofurantoin] Nausea And Vomiting   Augmentin [Amoxicillin-Pot Clavulanate]     Pt states it makes her sick   Cefdinir Itching   Metformin And Related Nausea And Vomiting    Immunization History  Administered Date(s) Administered   Influenza,inj,Quad PF,6+ Mos 01/07/2017   Influenza-Unspecified 12/18/2013   Janssen (J&J) SARS-COV-2 Vaccination 06/12/2019   Pneumococcal Polysaccharide-23 08/21/2020   Tdap 04/10/2014, 08/30/2016    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 60 tablet 2   amitriptyline (ELAVIL) 50 MG tablet Take 50 mg by mouth at bedtime.     amitriptyline (ELAVIL) 75 MG tablet Take by mouth.     amLODipine (NORVASC) 5 MG tablet Take 1 tablet by mouth daily.     Blood Pressure Monitoring (BLOOD PRESSURE KIT) KIT Use to check blood pressure twice daily and records BP logs nd bring to your office visits     cetirizine (ZYRTEC) 10 MG tablet Take 1 tablet (10 mg total) by mouth daily. 30 tablet 5    Cholecalciferol (VITAMIN D3) 125 MCG (5000 UT) CAPS Take 1 capsule by mouth daily.     clindamycin (CLEOCIN T) 1 % lotion Apply to cleansed face daily in the morning. 60 mL 1   ergocalciferol (VITAMIN D2) 1.25 MG (50000 UT) capsule Take by mouth.     famotidine (PEPCID) 40 MG tablet Take 1 tablet (40 mg total) by mouth at bedtime. 90 tablet 0   Ferrous Sulfate (IRON) 28 MG TABS Take 1 tablet by mouth daily.     fexofenadine (ALLEGRA) 180 MG tablet Take 1 tablet (180 mg total) by mouth daily. 90 tablet 3   fluconazole (DIFLUCAN) 150 MG tablet Take 1 tablet (150 mg total) by mouth every three (3) days as needed. 2 tablet 0   fluticasone (FLONASE) 50 MCG/ACT nasal spray Place 1 spray into both nostrils daily. Begin by using 2 sprays in each nare daily for 3 to 5 days, then decrease to 1 spray in each nare daily. 18 mL 5   fosfomycin (MONUROL) 3 g PACK SMARTSIG:3 Gram(s) By Mouth Once     gemfibrozil (LOPID) 600 MG tablet Take by mouth.     hydrOXYzine (ATARAX) 25 MG tablet TAKE 1 TABLET BY MOUTH THREE TIMES A DAY AS NEEDED FOR ANXIETY FOR UP TO 90 DAYS     ipratropium (ATROVENT) 0.06 % nasal spray Place 2 sprays into both nostrils 3 (three) times daily. As needed for nasal congestion, runny  nose 15 mL 2   meloxicam (MOBIC) 15 MG tablet Take 1 tablet (15 mg total) by mouth daily. 30 tablet 3   methocarbamol (ROBAXIN) 500 MG tablet Take 1 tablet (500 mg total) by mouth 2 (two) times daily. 20 tablet 0   montelukast (SINGULAIR) 10 MG tablet Take 1 tablet (10 mg total) by mouth at bedtime. 90 tablet 0   Na Sulfate-K Sulfate-Mg Sulf 17.5-3.13-1.6 GM/177ML SOLN See admin instructions.     naproxen (NAPROSYN) 375 MG tablet Take 1 tablet (375 mg total) by mouth 2 (two) times daily as needed. 20 tablet 0   ondansetron (ZOFRAN) 4 MG tablet PLEASE SEE ATTACHED FOR DETAILED DIRECTIONS     ondansetron (ZOFRAN-ODT) 4 MG disintegrating tablet DISSOLVE TAKE 1 TABLET BY MOUTH EVERY 8 HOURS AS NEEDED FOR NAUSEA AND  VOMITING 20 tablet 4   ondansetron (ZOFRAN-ODT) 4 MG disintegrating tablet Take by mouth.     pantoprazole (PROTONIX) 40 MG tablet Take 1 tablet (40 mg total) by mouth daily. 30 tablet 6   polyethylene glycol powder (GLYCOLAX/MIRALAX) 17 GM/SCOOP powder Take 17 g by mouth daily as needed for mild constipation. 238 g 0   predniSONE (DELTASONE) 10 MG tablet Take by mouth.     predniSONE (STERAPRED UNI-PAK 21 TAB) 10 MG (21) TBPK tablet Take by mouth daily. Take 6 tabs by mouth daily  for 1 days, then 5 tabs for 1 days, then 4 tabs for 1 days, then 3 tabs for 1 days, 2 tabs for 1 days, then 1 tab by mouth daily for 1 days 21 tablet 0   pseudoephedrine (SUDAFED) 60 MG tablet Take 1 tablet (60 mg total) by mouth every 8 (eight) hours as needed for congestion. 30 tablet 0   SUMAtriptan (IMITREX) 100 MG tablet Take 1 tablet at earliest onset of migraine.  May repeat in 2 hours if needed.  Maximum 2 tablets in 24 hours. 10 tablet 5   topiramate (TOPAMAX) 25 MG tablet Take 1 tablet (25 mg total) by mouth at bedtime. 30 tablet 5   tretinoin (RETIN-A) 0.025 % cream Apply pea size amount to cleansed face every other day at bedtime. Increase to every night as tolerated. 20 g 0   triamcinolone cream (KENALOG) 0.1 % Apply to affected areas twice daily for 3 days on and 3 days off cycle or as needed. 80 g 0   triamcinolone ointment (KENALOG) 0.5 % 1 application 2 times daily as needed to itchy/ inflamed areas from allergy injections. 30 g 1   Vitamin D, Ergocalciferol, (DRISDOL) 1.25 MG (50000 UNIT) CAPS capsule Take by mouth.     No facility-administered medications prior to visit.      Review of Systems  Review of Systems   Physical Exam  There were no vitals taken for this visit. Physical Exam   Lab Results:  CBC    Component Value Date/Time   WBC 6.3 08/07/2022 0909   RBC 4.64 08/07/2022 0909   HGB 13.1 08/07/2022 0909   HGB 13.3 01/21/2021 1525   HCT 40.6 08/07/2022 0909   HCT 40.3  01/21/2021 1525   PLT 260 08/07/2022 0909   PLT 251 01/21/2021 1525   MCV 87.5 08/07/2022 0909   MCV 88 01/21/2021 1525   MCH 28.2 08/07/2022 0909   MCHC 32.3 08/07/2022 0909   RDW 14.9 08/07/2022 0909   RDW 14.4 01/21/2021 1525   LYMPHSABS 2.5 08/07/2022 0909   LYMPHSABS 2.8 07/14/2019 0950   MONOABS 0.4 08/07/2022 0909  EOSABS 0.2 08/07/2022 0909   EOSABS 0.2 07/14/2019 0950   BASOSABS 0.1 08/07/2022 0909   BASOSABS 0.0 07/14/2019 0950    BMET    Component Value Date/Time   NA 138 08/07/2022 0909   NA 139 01/21/2021 1525   K 4.0 08/07/2022 0909   CL 105 08/07/2022 0909   CO2 23 08/07/2022 0909   GLUCOSE 86 08/07/2022 0909   BUN 12 08/07/2022 0909   BUN 10 01/21/2021 1525   CREATININE 0.84 08/07/2022 0909   CREATININE 0.65 12/19/2010 1030   CALCIUM 9.7 08/07/2022 0909   GFRNONAA >60 08/07/2022 0909   GFRAA 107 08/10/2019 1450    BNP No results found for: "BNP"  ProBNP No results found for: "PROBNP"  Imaging: DG Foot Complete Right  Result Date: 09/22/2022 Please see detailed radiograph report in office note.  DG Foot Complete Left  Result Date: 09/22/2022 Please see detailed radiograph report in office note.    Assessment & Plan:   No problem-specific Assessment & Plan notes found for this encounter.     Glenford Bayley, NP 10/11/2022

## 2022-10-12 ENCOUNTER — Institutional Professional Consult (permissible substitution): Payer: BLUE CROSS/BLUE SHIELD | Admitting: Primary Care

## 2022-10-14 ENCOUNTER — Ambulatory Visit (INDEPENDENT_AMBULATORY_CARE_PROVIDER_SITE_OTHER): Payer: Medicaid Other | Admitting: *Deleted

## 2022-10-14 DIAGNOSIS — J309 Allergic rhinitis, unspecified: Secondary | ICD-10-CM | POA: Diagnosis not present

## 2022-10-17 ENCOUNTER — Ambulatory Visit
Admission: EM | Admit: 2022-10-17 | Discharge: 2022-10-17 | Disposition: A | Discharge status: Home / Self Care | Payer: Medicaid Other | Attending: Internal Medicine | Admitting: Internal Medicine

## 2022-10-17 DIAGNOSIS — N760 Acute vaginitis: Secondary | ICD-10-CM | POA: Diagnosis present

## 2022-10-17 DIAGNOSIS — J069 Acute upper respiratory infection, unspecified: Secondary | ICD-10-CM

## 2022-10-17 DIAGNOSIS — Z3202 Encounter for pregnancy test, result negative: Secondary | ICD-10-CM

## 2022-10-17 LAB — POCT URINALYSIS DIP (MANUAL ENTRY)
Bilirubin, UA: NEGATIVE
Glucose, UA: NEGATIVE mg/dL
Ketones, POC UA: NEGATIVE mg/dL
Leukocytes, UA: NEGATIVE
Nitrite, UA: NEGATIVE
Protein Ur, POC: NEGATIVE mg/dL
Spec Grav, UA: 1.015 (ref 1.010–1.025)
Urobilinogen, UA: 0.2 E.U./dL
pH, UA: 6 (ref 5.0–8.0)

## 2022-10-17 LAB — POCT URINE PREGNANCY: Preg Test, Ur: NEGATIVE

## 2022-10-17 MED ORDER — GUAIFENESIN ER 1200 MG PO TB12: 1200.0000 mg | ORAL_TABLET | Freq: Two times a day (BID) | ORAL | 0 refills | Status: DC

## 2022-10-17 MED ORDER — FLUCONAZOLE 150 MG PO TABS: 150.0000 mg | ORAL_TABLET | ORAL | 0 refills | Status: DC

## 2022-10-17 MED ORDER — METRONIDAZOLE 500 MG PO TABS: 500.0000 mg | ORAL_TABLET | Freq: Two times a day (BID) | ORAL | 0 refills | Status: DC

## 2022-10-17 MED ORDER — BENZONATATE 100 MG PO CAPS: 100.0000 mg | ORAL_CAPSULE | Freq: Three times a day (TID) | ORAL | 0 refills | Status: DC

## 2022-10-17 MED ORDER — PROMETHAZINE-DM 6.25-15 MG/5ML PO SYRP: 5.0000 mL | ORAL_SOLUTION | Freq: Every evening | ORAL | 0 refills | Status: DC | PRN

## 2022-10-17 MED ORDER — KETOROLAC TROMETHAMINE 30 MG/ML IJ SOLN
30.0000 mg | Freq: Once | INTRAMUSCULAR | Status: AC
  Administered 2022-10-17: 30 mg via INTRAMUSCULAR

## 2022-10-17 MED ORDER — METRONIDAZOLE 0.75 % VA GEL: 1.0000 | Freq: Every day | VAGINAL | 0 refills | Status: AC

## 2022-10-17 NOTE — Discharge Instructions (Signed)
Your symptoms are most likely due to a viral illness, which will improve on its own with rest and fluids.  - Take prescribed medicines to help with symptoms: Tessalon Perles, Promethazine DM, guaifenesin (cough syrup can make you sleepy so only take at bedtime) - Use over the counter medicines to help with symptoms as discussed: Ibuprofen, tylenol, guaifenesin (mucinex), zyrtec, etc I gave you a shot of ketorolac in the clinic, do not take any ibuprofen until tomorrow as this is a similar medication.  You may continue taking Tylenol as needed. - Two teaspoons of honey in warm water every 4-6 hours may help with throat pains - Humidifier in your room at night to help add water the air and soothe cough  I suspect that your  symptoms are due to suspected bacterial vaginitis and vaginal yeast infection.  Take medication as prescribed. Do not drink any alcohol during duration of Flagyl course and for 72 hours after finishing antibiotic as this can make you very nauseous and have an adverse reaction.  Your swab has been sent for testing, staff will call you in 2-3 days if you test positive for any other infections and will provide treatment at that time.  Return to urgent care as needed and follow-up with your primary care provider for further evaluation and management of your symptoms..  I hope you feel better!    If you develop any new or worsening symptoms or do not improve in the next 2 to 3 days, please return.  If your symptoms are severe, please go to the emergency room.  Follow-up with PCP as needed.

## 2022-10-17 NOTE — ED Triage Notes (Signed)
"  For about a week I have had cough, congestion with headache. Runny nose/stuff. Sinus pain/pressure". No fever known.   "Also, I am having Vaginal itching/burning". Also some "abnormal discharge". Possible concern for STI.

## 2022-10-17 NOTE — ED Provider Notes (Signed)
[facility]    CSN: [id number] Arrival date & time: 10/17/22  0800      History   Chief Complaint Chief Complaint  Patient presents with   Cough    With ha.   Vaginal problem    HPI Sherri [name] is a 37 y.o. female.   Patient presents to urgent care for evaluation of cough, nasal congestion, headache, sneezing, chills that started on Monday October 12, 2022 (5 days ago). Cough is sometimes productive but mostly dry. Sputum is yellow/clear. Headache is generalized to the frontal aspect of the head and is "throbbing/spasm" in nature. Headache is currently 8/10. Boyfriend is sick with similar symptoms, no other recent sick contacts. No CP, SOB, N/V/D, abdominal pain, or rash. No fever at home. Never smoker, no history of chronic respiratory problems. Using tylenol, zyrtec, and other cough and cold medicines OTC without much relief. Last dose of tylenol was 2 days ago, stopped taking medicines because they weren't working.   In addition, urinary frequency, urgency, dysuria started 5 days ago on Monday October 12, 2022 as well. Reports vaginal itching, odor, and discharge. Vaginal discharge is thick, white, and with itch. She is a diabetic and her sugars have been normal. Taking diabetes medicines as prescribed. Denies recent antibiotic/steroid use, does not take SGLT-2 inhibitor. In a monogamous relationship with boyfriend, sexually active unprotected. No concern for STD. LMP 09/30/2022. Has not attempted treatment of vaginal/urinary symptoms PTA.    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    Urinary tract infection    Uterine fibroid     Patient Active Problem List   Diagnosis Date Noted   Moderate episode of recurrent major depressive disorder (HCC) 07/16/2022    Pre-diabetes 07/23/2021   Anxiety and depression 06/24/2021   Fibromyalgia 06/24/2021   Chronic sinusitis 12/26/2020   Sinus pressure 12/26/2020   Prediabetes 07/05/2020   Gastroesophageal reflux disease 04/26/2020   Perianal rash 03/26/2020   Chronic nausea 03/26/2020   Chronic constipation 03/09/2019   Pelvic pain in female 03/09/2019   Hypertension    Genital herpes    Uterine fibroid 12/25/2013   Obesity 12/25/2013   Bacterial vaginitis 04/27/2012    Past Surgical History:  Procedure Laterality Date   CESAREAN SECTION N/A 06/05/2014   Procedure: CESAREAN SECTION;  Surgeon: Levie Heritage, DO;  Location: [facility];  Service: Obstetrics;  Laterality: N/A;   WISDOM TOOTH EXTRACTION      OB History     Gravida  1   Para  1   Term      Preterm  1   AB      Living  2      SAB      IAB      Ectopic      Multiple  1   Live Births  2            Home Medications    Prior to Admission medications   Medication Sig Start Date End Date Taking? Authorizing Provider  amitriptyline (ELAVIL) 75 MG tablet Take by mouth. 08/28/22 11/26/22 Yes [provider]  benzonatate (TESSALON) 100 MG capsule Take 1 capsule (100 mg total) by mouth every 8 (eight) hours. 10/17/22  Yes Carlisle Beers, FNP  fluconazole (DIFLUCAN) 150 MG tablet Take 1 tablet (150 mg total) by mouth every 3 (three) days. 10/17/22  Yes Carlisle Beers, FNP  Guaifenesin 1200 MG TB12 Take 1 tablet (1,200 mg total) by mouth in the morning and at bedtime. 10/17/22  Yes Carlisle Beers, FNP  metroNIDAZOLE (METROGEL) 0.75 % vaginal gel Place 1 Applicatorful vaginally at bedtime for 5 days. 10/17/22 10/22/22 Yes Carlisle Beers, FNP  ondansetron (ZOFRAN-ODT) 4 MG disintegrating tablet Take by mouth. 08/27/22  Yes [provider]  promethazine-dextromethorphan (PROMETHAZINE-DM) 6.25-15 MG/5ML syrup Take 5 mLs by mouth at bedtime as needed for cough. 10/17/22  Yes Carlisle Beers, FNP  amitriptyline (ELAVIL) 25 MG tablet Take 2 tablets (50 mg total) by mouth at bedtime. 06/24/21 08/07/22  Mayers, Cari S, PA-C  amitriptyline (ELAVIL) 50 MG tablet Take 50 mg by mouth at bedtime. 06/19/22   [provider]  amLODipine (NORVASC) 5 MG tablet Take 1 tablet by mouth daily. 08/11/22   [provider]  Blood Pressure Monitoring (BLOOD PRESSURE KIT) KIT Use to check blood pressure twice daily and records BP logs nd bring to your office visits 08/03/22   [provider]  cetirizine (ZYRTEC) 10 MG tablet Take 1 tablet (10 mg total) by mouth daily. 07/01/22   Marcelyn Bruins, MD  Cholecalciferol (VITAMIN D3) 125 MCG (5000 UT) CAPS Take 1 capsule by mouth daily.    [provider]  clindamycin (CLEOCIN T) 1 % lotion Apply to cleansed face daily in the morning. 06/30/21     ergocalciferol (VITAMIN D2) 1.25 MG (50000 UT) capsule Take by mouth. 08/11/22 11/09/22  [provider]  famotidine (PEPCID) 40 MG tablet Take 1 tablet (40 mg total) by mouth at bedtime. 01/27/22 08/07/22  Theadora Rama Scales, PA-C  Ferrous Sulfate (IRON) 28 MG TABS Take 1 tablet by mouth daily.    [provider]  fexofenadine (ALLEGRA) 180 MG tablet Take 1 tablet (180 mg total) by mouth daily. 03/08/22   Wallis Bamberg, PA-C  fluticasone (FLONASE) 50 MCG/ACT nasal spray Place 1 spray into both nostrils daily. Begin by using 2 sprays in each nare daily for 3 to 5 days, then decrease to 1 spray in each nare daily. 03/08/22   Wallis Bamberg, PA-C  fosfomycin (MONUROL) 3 g PACK SMARTSIG:3 Gram(s) By Mouth Once 06/16/22   [provider]  gemfibrozil (LOPID) 600 MG tablet Take by mouth. 06/01/22   [provider]  hydrOXYzine (ATARAX) 25 MG tablet TAKE 1 TABLET BY MOUTH THREE TIMES A DAY AS NEEDED FOR ANXIETY FOR UP TO 90 DAYS 08/11/22   [provider]  ipratropium (ATROVENT) 0.06 % nasal spray Place 2 sprays into both nostrils 3 (three) times  daily. As needed for nasal congestion, runny nose 01/27/22   Theadora Rama Scales, PA-C  meloxicam (MOBIC) 15 MG tablet Take 1 tablet (15 mg total) by mouth daily. 09/22/22   McDonald, Rachelle Hora, DPM  methocarbamol (ROBAXIN) 500 MG tablet Take 1 tablet (500 mg total) by mouth 2 (two) times daily. 09/11/22   Bing Neighbors, NP  montelukast (SINGULAIR) 10 MG tablet Take 1 tablet (10 mg total) by mouth at bedtime. 01/27/22 08/07/22  Theadora Rama Scales, PA-C  Na Sulfate-K Sulfate-Mg Sulf 17.5-3.13-1.6 GM/177ML SOLN See admin instructions. 04/10/22   [provider]  naproxen (NAPROSYN) 375 MG tablet Take 1 tablet (375 mg total) by mouth 2 (two) times daily as needed. 09/11/22   Bing Neighbors, NP  ondansetron (ZOFRAN) 4 MG tablet PLEASE SEE ATTACHED FOR DETAILED DIRECTIONS 07/08/22   [provider]  ondansetron (ZOFRAN-ODT) 4 MG disintegrating tablet DISSOLVE TAKE 1 TABLET BY MOUTH EVERY 8 HOURS AS NEEDED FOR NAUSEA AND VOMITING 08/11/22   Everlena Cooper, Adam R, DO  pantoprazole (PROTONIX) 40 MG tablet Take 1 tablet (40 mg total) by mouth daily. 11/17/21   Meredith Pel, NP  polyethylene glycol powder (GLYCOLAX/MIRALAX) 17 GM/SCOOP powder Take 17 g by mouth daily as needed for mild constipation. 09/27/21   Renne Crigler, PA-C  predniSONE (DELTASONE) 10 MG tablet Take by mouth. 08/07/22   [provider]  predniSONE (STERAPRED UNI-PAK 21 TAB) 10 MG (21) TBPK tablet Take by mouth daily. Take 6 tabs by mouth daily  for 1 days, then 5 tabs for 1 days, then 4 tabs for 1 days, then 3 tabs for 1 days, 2 tabs for 1 days, then 1 tab by mouth daily for 1 days 08/07/22   Verlon Setting, Whitney L, PA  pseudoephedrine (SUDAFED) 60 MG tablet Take 1 tablet (60 mg total) by mouth every 8 (eight) hours as needed for congestion. 03/08/22   Wallis Bamberg, PA-C  SUMAtriptan (IMITREX) 100 MG tablet Take 1 tablet at earliest onset of migraine.  May repeat in 2 hours if needed.  Maximum 2 tablets in 24 hours. 04/22/22    Drema Dallas, DO  topiramate (TOPAMAX) 25 MG tablet Take 1 tablet (25 mg total) by mouth at bedtime. 04/22/22   Drema Dallas, DO  tretinoin (RETIN-A) 0.025 % cream Apply pea size amount to cleansed face every other day at bedtime. Increase to every night as tolerated. 06/30/21     triamcinolone cream (KENALOG) 0.1 % Apply to affected areas twice daily for 3 days on and 3 days off cycle or as needed. 06/30/21     triamcinolone ointment (KENALOG) 0.5 % 1 application 2 times daily as needed to itchy/ inflamed areas from allergy injections. 08/11/22   Marcelyn Bruins, MD  Vitamin D, Ergocalciferol, (DRISDOL) 1.25 MG (50000 UNIT) CAPS capsule Take by mouth. 07/07/22   [provider]    Family History Family History  Problem Relation Age of Onset   Hypertension Mother    Hypertension Father    Breast cancer Maternal Grandmother        23   Colon cancer Neg Hx    Colon polyps Neg Hx    Kidney disease Neg Hx    Diabetes Neg Hx    Esophageal cancer Neg Hx    Gallbladder disease Neg Hx    Heart disease Neg Hx    Asthma Neg Hx    Stroke Neg Hx    Stomach cancer Neg Hx     Social History Social History   Tobacco Use   Smoking status: Never    Passive exposure: Never   Smokeless tobacco: Never  Vaping Use   Vaping status: Never Used  Substance Use Topics   Alcohol use: No   Drug use: Never     Allergies   Macrobid [nitrofurantoin], Augmentin [amoxicillin-pot clavulanate], Cefdinir, and Metformin and related   Review of Systems Review of Systems  Respiratory:  Positive for cough.   Per HPI   Physical Exam Triage Vital Signs ED Triage Vitals  Encounter Vitals Group     BP 10/17/22 0810 118/76     Systolic BP Percentile --      Diastolic BP Percentile --      Pulse Rate 10/17/22 0810  69     Resp 10/17/22 0810 18     Temp 10/17/22 0810 98.6 F (37 C)     Temp Source 10/17/22 0810 Oral     SpO2 10/17/22 0810 97 %     Weight 10/17/22 0807 175 lb (79.4 kg)      Height 10/17/22 0807 4\' 11"  (1.499 m)     Head Circumference --      Peak Flow --      Pain Score 10/17/22 0804 8     Pain Loc --      Pain Education --      Exclude from Growth Chart --    No data found.  Updated Vital Signs BP 118/76 (BP Location: Left Arm)   Pulse 69   Temp 98.6 F (37 C) (Oral)   Resp 18   Ht 4\' 11"  (1.499 m)   Wt 175 lb (79.4 kg)   LMP 09/30/2022 (Exact Date)   SpO2 97%   BMI 35.35 kg/m   Visual Acuity Right Eye Distance:   Left Eye Distance:   Bilateral Distance:    Right Eye Near:   Left Eye Near:    Bilateral Near:     Physical Exam Vitals and nursing note reviewed.  Constitutional:      Appearance: She is not ill-appearing or toxic-appearing.  HENT:     Head: Normocephalic and atraumatic.     Right Ear: Hearing, tympanic membrane, ear canal and external ear normal.     Left Ear: Hearing, tympanic membrane, ear canal and external ear normal.     Nose: Congestion present.     Mouth/Throat:     Lips: Pink.     Mouth: Mucous membranes are moist. No injury.     Tongue: No lesions. Tongue does not deviate from midline.     Palate: No mass and lesions.     Pharynx: Oropharynx is clear. Uvula midline. Posterior oropharyngeal erythema present. No pharyngeal swelling, oropharyngeal exudate or uvula swelling.     Tonsils: No tonsillar exudate or tonsillar abscesses.  Eyes:     General: Lids are normal. Vision grossly intact. Gaze aligned appropriately.     Extraocular Movements: Extraocular movements intact.     Conjunctiva/sclera: Conjunctivae normal.  Cardiovascular:     Rate and Rhythm: Normal rate and regular rhythm.     Heart sounds: Normal heart sounds, S1 normal and S2 normal.  Pulmonary:     Effort: Pulmonary effort is normal. No respiratory distress.     Breath sounds: Normal breath sounds and air entry. No wheezing, rhonchi or rales.  Chest:     Chest wall: No tenderness.  Abdominal:     Tenderness: There is no right CVA  tenderness or left CVA tenderness.  Musculoskeletal:     Cervical back: Neck supple.     Right lower leg: No edema.     Left lower leg: No edema.  Skin:    General: Skin is warm and dry.     Capillary Refill: Capillary refill takes less than 2 seconds.     Findings: No rash.  Neurological:     General: No focal deficit present.     Mental Status: She is alert and oriented to person, place, and time. Mental status is at baseline.     Cranial Nerves: No dysarthria or facial asymmetry.  Psychiatric:        Mood and Affect: Mood normal.        Speech: Speech normal.  Behavior: Behavior normal.        Thought Content: Thought content normal.        Judgment: Judgment normal.      UC Treatments / Results  Labs (all labs ordered are listed, but only abnormal results are displayed) Labs Reviewed  POCT URINALYSIS DIP (MANUAL ENTRY) - Abnormal; Notable for the following components:      Result Value   Clarity, UA cloudy (*)    Blood, UA trace-intact (*)    All other components within normal limits  POCT URINE PREGNANCY  CERVICOVAGINAL ANCILLARY ONLY    EKG   Radiology No results found.  Procedures Procedures (including critical care time)  Medications Ordered in UC Medications  ketorolac (TORADOL) 30 MG/ML injection 30 mg (30 mg Intramuscular Given 10/17/22 0840)    Initial Impression / Assessment and Plan / UC Course  I have reviewed the triage vital signs and the nursing notes.  Pertinent labs & imaging results that were available during my care of the patient were reviewed by me and considered in my medical decision making (see chart for details).   1.  Vaginitis and vulvovaginitis High clinical suspicion for acute yeast vaginitis and bacterial vaginitis, therefore will treat with diflucan and MetroGel empirically.  Patient specifically requests MetroGel versus Flagyl pills. Vaginal swab pending, will treat for other positive infections per protocol when labs  result. Discussed tips to prevent recurrent yeast vaginitis infections. Safe sex precautions discussed.   Urinalysis is unremarkable for signs of urinary tract infection. Negative urine pregnancy test. Encouraged to continue drinking plenty of fluids to stay well-hydrated.  2.  Viral URI with cough Evaluation suggests viral URI etiology. Will manage this with recommendations for OTC and prescription medications for symptomatic relief. Encouraged to push fluids to stay well hydrated.  Imaging: deferred based on stable cardiopulmonary exam/hemodynamically stable vital signs Prescriptions sent for further symptomatic relief, may continue using OTC medications as needed. Medications given in clinic: Ketorolac 30 mg IM for headache, no NSAIDs for 24 hours. Strep/Viral testing: Deferred based on timing of illness  Counseled patient on potential for adverse effects with medications prescribed/recommended today, strict ER and return-to-clinic precautions discussed, patient verbalized understanding.   Final Clinical Impressions(s) / UC Diagnoses   Final diagnoses:  Vaginitis and vulvovaginitis  Viral URI with cough  Urine pregnancy test negative     Discharge Instructions      Your symptoms are most likely due to a viral illness, which will improve on its own with rest and fluids.  - Take prescribed medicines to help with symptoms: Tessalon Perles, Promethazine DM, guaifenesin (cough syrup can make you sleepy so only take at bedtime) - Use over the counter medicines to help with symptoms as discussed: Ibuprofen, tylenol, guaifenesin (mucinex), zyrtec, etc I gave you a shot of ketorolac in the clinic, do not take any ibuprofen until tomorrow as this is a similar medication.  You may continue taking Tylenol as needed. - Two teaspoons of honey in warm water every 4-6 hours may help with throat pains - Humidifier in your room at night to help add water the air and soothe cough  I suspect that  your  symptoms are due to suspected bacterial vaginitis and vaginal yeast infection.  Take medication as prescribed. Do not drink any alcohol during duration of Flagyl course and for 72 hours after finishing antibiotic as this can make you very nauseous and have an adverse reaction.  Your swab has been sent for testing, staff  will call you in 2-3 days if you test positive for any other infections and will provide treatment at that time.  Return to urgent care as needed and follow-up with your primary care provider for further evaluation and management of your symptoms..  I hope you feel better!    If you develop any new or worsening symptoms or do not improve in the next 2 to 3 days, please return.  If your symptoms are severe, please go to the emergency room.  Follow-up with PCP as needed.      ED Prescriptions     Medication Sig Dispense Auth. Provider   benzonatate (TESSALON) 100 MG capsule Take 1 capsule (100 mg total) by mouth every 8 (eight) hours. 21 capsule Carlisle Beers, FNP   promethazine-dextromethorphan (PROMETHAZINE-DM) 6.25-15 MG/5ML syrup Take 5 mLs by mouth at bedtime as needed for cough. 118 mL Reita May M, FNP   Guaifenesin 1200 MG TB12 Take 1 tablet (1,200 mg total) by mouth in the morning and at bedtime. 14 tablet Reita May M, FNP   fluconazole (DIFLUCAN) 150 MG tablet Take 1 tablet (150 mg total) by mouth every 3 (three) days. 2 tablet Carlisle Beers, FNP   metroNIDAZOLE (FLAGYL) 500 MG tablet  (Status: Discontinued) Take 1 tablet (500 mg total) by mouth 2 (two) times daily. 14 tablet Reita May M, FNP   metroNIDAZOLE (METROGEL) 0.75 % vaginal gel Place 1 Applicatorful vaginally at bedtime for 5 days. 50 g Carlisle Beers, FNP      PDMP not reviewed this encounter.   Reita May [place], [place] 10/17/22 [phone]

## 2022-10-19 LAB — CERVICOVAGINAL ANCILLARY ONLY
Bacterial Vaginitis (gardnerella): POSITIVE — AB
Candida Glabrata: NEGATIVE
Candida Vaginitis: NEGATIVE
Chlamydia: NEGATIVE
Comment: NEGATIVE
Comment: NEGATIVE
Comment: NEGATIVE
Comment: NEGATIVE
Comment: NEGATIVE
Comment: NORMAL
Neisseria Gonorrhea: NEGATIVE
Trichomonas: NEGATIVE

## 2022-10-20 ENCOUNTER — Ambulatory Visit (INDEPENDENT_AMBULATORY_CARE_PROVIDER_SITE_OTHER): Payer: Medicaid Other | Admitting: *Deleted

## 2022-10-20 DIAGNOSIS — J309 Allergic rhinitis, unspecified: Secondary | ICD-10-CM | POA: Diagnosis not present

## 2022-10-22 ENCOUNTER — Ambulatory Visit: Payer: Medicaid Other | Admitting: Obstetrics and Gynecology

## 2022-10-23 ENCOUNTER — Other Ambulatory Visit: Payer: Self-pay

## 2022-10-27 ENCOUNTER — Ambulatory Visit: Payer: Self-pay | Admitting: *Deleted

## 2022-10-27 DIAGNOSIS — J309 Allergic rhinitis, unspecified: Secondary | ICD-10-CM

## 2022-11-03 ENCOUNTER — Ambulatory Visit (INDEPENDENT_AMBULATORY_CARE_PROVIDER_SITE_OTHER): Payer: Medicaid Other | Admitting: *Deleted

## 2022-11-03 DIAGNOSIS — J309 Allergic rhinitis, unspecified: Secondary | ICD-10-CM

## 2022-11-09 ENCOUNTER — Ambulatory Visit (INDEPENDENT_AMBULATORY_CARE_PROVIDER_SITE_OTHER): Payer: Medicaid Other | Admitting: Podiatry

## 2022-11-09 DIAGNOSIS — M62461 Contracture of muscle, right lower leg: Secondary | ICD-10-CM

## 2022-11-09 DIAGNOSIS — M722 Plantar fascial fibromatosis: Secondary | ICD-10-CM | POA: Diagnosis not present

## 2022-11-09 DIAGNOSIS — M62462 Contracture of muscle, left lower leg: Secondary | ICD-10-CM

## 2022-11-09 MED ORDER — METHYLPREDNISOLONE 4 MG PO TBPK: ORAL_TABLET | ORAL | 0 refills | Status: DC

## 2022-11-09 NOTE — Progress Notes (Signed)
  Subjective:  Patient ID: Sherri [name], female    DOB: 1985/07/31,  MRN: [id number]  Chief Complaint  Patient presents with   Plantar Fasciitis    6 weeks for recheck plantar fasciitis. Left foot is still borthering ,right is good    37 y.o. female presents with the above complaint. History confirmed with patient.  Both feet feel about 50% better  Objective:  Physical Exam: warm, good capillary refill, no trophic changes or ulcerative lesions, normal DP and PT pulses, and normal sensory exam. Left Foot: point tenderness over the heel pad and gastrocnemius equinus is noted with a positive silverskiold test Right Foot: point tenderness over the heel pad and gastrocnemius equinus is noted with a positive silverskiold test  No images are attached to the encounter.  Radiographs: Multiple views x-ray of both feet: no fracture, dislocation, swelling or degenerative changes noted and plantar calcaneal spur Assessment:   1. Bilateral plantar fasciitis      Plan:  Patient was evaluated and treated and all questions answered.  We discussed her progress she will continue her home therapy, was not able to tolerate NSAIDs due to gastric type symptoms.  Rx for methylprednisolone taper sent to pharmacy.  I recommend repeat injection.  We also discussed surgical intervention and I ordered MRIs on both heels to evaluate for the thickness of the plantar fascia, bone marrow edema as well.  Likely will need gastrocnemius recessions as well.  After sterile prep with povidone-iodine solution and alcohol, the bilateral heel was injected with 0.5cc 2% xylocaine plain, 0.5cc 0.5% marcaine plain, 5mg  triamcinolone acetonide, and 2mg  dexamethasone was injected along the medial plantar fascia at the insertion on the plantar calcaneus. The patient tolerated the procedure well without complication.  Return in about 8 weeks (around 01/04/2023) for recheck plantar fasciitis.

## 2022-11-09 NOTE — Patient Instructions (Addendum)
Call [facility] to schedule your MRI at the below locations.  Please allow at least 1 business day after your visit to process the referral.  It may take longer depending on approval from insurance.  Please let me know if you have issues or problems scheduling the MRI   [facility] [place] [phone] [address] [place], [place] [address]  [facility] [phone] [place], [place] [address]    Plantar Fasciitis (Heel Spur Syndrome) with Rehab The plantar fascia is a fibrous, ligament-like, soft-tissue structure that spans the bottom of the foot. Plantar fasciitis is a condition that causes pain in the foot due to inflammation of the tissue. SYMPTOMS  Pain and tenderness on the underneath side of the foot. Pain that worsens with standing or walking. CAUSES  Plantar fasciitis is caused by irritation and injury to the plantar fascia on the underneath side of the foot. Common mechanisms of injury include: Direct trauma to bottom of the foot. Damage to a small nerve that runs under the foot where the main fascia attaches to the heel bone. Stress placed on the plantar fascia due to bone spurs. RISK INCREASES WITH:  Activities that place stress on the plantar fascia (running, jumping, pivoting, or cutting). Poor strength and flexibility. Improperly fitted shoes. Tight calf muscles. Flat feet. Failure to warm-up properly before activity. Obesity. PREVENTION Warm up and stretch properly before activity. Allow for adequate recovery between workouts. Maintain physical fitness: Strength, flexibility, and endurance. Cardiovascular fitness. Maintain a health body weight. Avoid stress on the plantar fascia. Wear properly fitted shoes, including arch supports for individuals who have flat feet.  PROGNOSIS  If treated properly, then the symptoms of plantar fasciitis usually resolve without surgery. However, occasionally  surgery is necessary.  RELATED COMPLICATIONS  Recurrent symptoms that may result in a chronic condition. Problems of the lower back that are caused by compensating for the injury, such as limping. Pain or weakness of the foot during push-off following surgery. Chronic inflammation, scarring, and partial or complete fascia tear, occurring more often from repeated injections.  TREATMENT  Treatment initially involves the use of ice and medication to help reduce pain and inflammation. The use of strengthening and stretching exercises may help reduce pain with activity, especially stretches of the Achilles tendon. These exercises may be performed at home or with a therapist. Your caregiver may recommend that you use heel cups of arch supports to help reduce stress on the plantar fascia. Occasionally, corticosteroid injections are given to reduce inflammation. If symptoms persist for greater than 6 months despite non-surgical (conservative), then surgery may be recommended.   MEDICATION  If pain medication is necessary, then nonsteroidal anti-inflammatory medications, such as aspirin and ibuprofen, or other minor pain relievers, such as acetaminophen, are often recommended. Do not take pain medication within 7 days before surgery. Prescription pain relievers may be given if deemed necessary by your caregiver. Use only as directed and only as much as you need. Corticosteroid injections may be given by your caregiver. These injections should be reserved for the most serious cases, because they may only be given a certain number of times.  HEAT AND COLD Cold treatment (icing) relieves pain and reduces inflammation. Cold treatment should be applied for 10 to 15 minutes every 2 to 3 hours for inflammation and pain and immediately after any activity that aggravates your symptoms. Use ice packs or massage the area with a piece of ice (ice massage). Heat treatment  may be used prior to performing the stretching  and strengthening activities prescribed by your caregiver, physical therapist, or athletic trainer. Use a heat pack or soak the injury in warm water.  SEEK IMMEDIATE MEDICAL CARE IF: Treatment seems to offer no benefit, or the condition worsens. Any medications produce adverse side effects.  EXERCISES- RANGE OF MOTION (ROM) AND STRETCHING EXERCISES - Plantar Fasciitis (Heel Spur Syndrome) These exercises may help you when beginning to rehabilitate your injury. Your symptoms may resolve with or without further involvement from your physician, physical therapist or athletic trainer. While completing these exercises, remember:  Restoring tissue flexibility helps normal motion to return to the joints. This allows healthier, less painful movement and activity. An effective stretch should be held for at least 30 seconds. A stretch should never be painful. You should only feel a gentle lengthening or release in the stretched tissue.  RANGE OF MOTION - Toe Extension, Flexion Sit with your right / left leg crossed over your opposite knee. Grasp your toes and gently pull them back toward the top of your foot. You should feel a stretch on the bottom of your toes and/or foot. Hold this stretch for 10 seconds. Now, gently pull your toes toward the bottom of your foot. You should feel a stretch on the top of your toes and or foot. Hold this stretch for 10 seconds. Repeat  times. Complete this stretch 3 times per day.   RANGE OF MOTION - Ankle Dorsiflexion, Active Assisted Remove shoes and sit on a chair that is preferably not on a carpeted surface. Place right / left foot under knee. Extend your opposite leg for support. Keeping your heel down, slide your right / left foot back toward the chair until you feel a stretch at your ankle or calf. If you do not feel a stretch, slide your bottom forward to the edge of the chair, while still keeping your heel down. Hold this stretch for 10 seconds. Repeat 3  times. Complete this stretch 2 times per day.   STRETCH  Gastroc, Standing Place hands on wall. Extend right / left leg, keeping the front knee somewhat bent. Slightly point your toes inward on your back foot. Keeping your right / left heel on the floor and your knee straight, shift your weight toward the wall, not allowing your back to arch. You should feel a gentle stretch in the right / left calf. Hold this position for 10 seconds. Repeat 3 times. Complete this stretch 2 times per day.  STRETCH  Soleus, Standing Place hands on wall. Extend right / left leg, keeping the other knee somewhat bent. Slightly point your toes inward on your back foot. Keep your right / left heel on the floor, bend your back knee, and slightly shift your weight over the back leg so that you feel a gentle stretch deep in your back calf. Hold this position for 10 seconds. Repeat 3 times. Complete this stretch 2 times per day.  STRETCH  Gastrocsoleus, Standing  Note: This exercise can place a lot of stress on your foot and ankle. Please complete this exercise only if specifically instructed by your caregiver.  Place the ball of your right / left foot on a step, keeping your other foot firmly on the same step. Hold on to the wall or a rail for balance. Slowly lift your other foot, allowing your body weight to press your heel down over the edge of the step. You should feel a stretch in your  right / left calf. Hold this position for 10 seconds. Repeat this exercise with a slight bend in your right / left knee. Repeat 3 times. Complete this stretch 2 times per day.   STRENGTHENING EXERCISES - Plantar Fasciitis (Heel Spur Syndrome)  These exercises may help you when beginning to rehabilitate your injury. They may resolve your symptoms with or without further involvement from your physician, physical therapist or athletic trainer. While completing these exercises, remember:  Muscles can gain both the endurance and  the strength needed for everyday activities through controlled exercises. Complete these exercises as instructed by your physician, physical therapist or athletic trainer. Progress the resistance and repetitions only as guided.  STRENGTH - Towel Curls Sit in a chair positioned on a non-carpeted surface. Place your foot on a towel, keeping your heel on the floor. Pull the towel toward your heel by only curling your toes. Keep your heel on the floor. Repeat 3 times. Complete this exercise 2 times per day.  STRENGTH - Ankle Inversion Secure one end of a rubber exercise band/tubing to a fixed object (table, pole). Loop the other end around your foot just before your toes. Place your fists between your knees. This will focus your strengthening at your ankle. Slowly, pull your big toe up and in, making sure the band/tubing is positioned to resist the entire motion. Hold this position for 10 seconds. Have your muscles resist the band/tubing as it slowly pulls your foot back to the starting position. Repeat 3 times. Complete this exercises 2 times per day.  Document Released: 02/16/2005 Document Revised: 05/11/2011 Document Reviewed: 05/31/2008 [facility] Patient Information 2014 [place], [place].

## 2022-11-11 ENCOUNTER — Institutional Professional Consult (permissible substitution): Payer: BLUE CROSS/BLUE SHIELD | Admitting: Primary Care

## 2022-11-12 ENCOUNTER — Ambulatory Visit (INDEPENDENT_AMBULATORY_CARE_PROVIDER_SITE_OTHER): Payer: Medicaid Other

## 2022-11-12 DIAGNOSIS — J309 Allergic rhinitis, unspecified: Secondary | ICD-10-CM | POA: Diagnosis not present

## 2022-11-16 ENCOUNTER — Encounter: Payer: Self-pay | Admitting: Primary Care

## 2022-11-16 NOTE — Progress Notes (Deleted)
NEUROLOGY FOLLOW UP OFFICE NOTE  ADRIEN FRYDRYCH [id number]  Assessment/Plan:   Chronic migraine without aura, without status migrainosus, not intractable   Migraine prevention:  topiramate *** Migraine rescue:  sumatriptan to 100mg .  Zofran ODT 4mg . *** Limit use of pain relievers to no more than 2 days out of week to prevent risk of rebound or medication-overuse headache. Keep headache diary Follow up ***  Subjective:   Sherri [name] is a 37 year old female with DM II and iron-deficiency anemia who follows up for migraines.  UPDATE: Started topiramate last visit.   *** Intensity:  *** Duration:  *** Frequency:  *** Frequency of abortive medication: *** Current NSAIDS/analgesics:  none Current triptans:  sumatriptan 100mg  *** Current ergotamine:  none Current anti-emetic:  Zofran ODT 4mg  Current muscle relaxants:  none Current Antihypertensive medications:  none Current Antidepressant medications:  amitriptyline 50mg  QHS (to help sleep) Current Anticonvulsant medications:  topiramate 25mg  at bedtime *** Current anti-CGRP:  none Current Antihistamines/Decongestants:  Flonase, Allegra, Sudafed Other therapy:  none Birth control:  none     Caffeine:  No coffee or colas Diet:  Sometimes ginger ale.  Trying to increase water intake.  Eats fast food.   Exercise:  no Depression:  no; Anxiety:  yes Sleep hygiene:  good with amitriptyline.  Gets 6-7 hours.  Wakes up tired.  She has been told that she snores in the past.    HISTORY:  Started having headaches starting around September 2023.  No prior history.  No known preceding trigger (illness, head injury, new medication) except for possibly stress.  They are moderate but can be severe.  It is a pounding pain across the forehead.  No neck pain.  Associated with nausea, photophobia, phonophobia, osmophobia.  No associated vomiting, visual disturbance or unilateral numbness or weakness.  No preceding aura or prodrome.   Often wakes up with headache.  They often last all day.  They occur every other day.  Usually will take a pain reliever no more than twice a week.     They were frequent in September and October, requiring 2 ED/Urgent Care visits.  CT head on 11/30/2021 personally reviewed was normal.     She also has chronic abdominal pain with nausea, vomiting and constipation.  GI workup has been negative.     Past NSAIDS/analgesics:  acetaminophen, Excedrin, ibuprofen, diclofenac, naproxen Past abortive triptans:  none Past abortive ergotamine:  none Past muscle relaxants:  Flexeril, Robaxin Past anti-emetic:  promethazine Past antihypertensive medications:  none Past antidepressant medications:  duloxetine, mirtazapine Past anticonvulsant medications:  none Past anti-CGRP:  none Past vitamins/Herbal/Supplements:  none Past antihistamines/decongestants:  Zyrtec Other past therapies:  none    Family history of headache:  no. No family history of cerebral aneurysms.    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 60 tablet 2   amitriptyline (ELAVIL) 50 MG tablet Take 50 mg by mouth at bedtime.     amitriptyline (ELAVIL) 75 MG tablet Take  by mouth.     amLODipine (NORVASC) 5 MG tablet Take 1 tablet by mouth daily.     benzonatate (TESSALON) 100 MG capsule Take 1 capsule (100 mg total) by mouth every 8 (eight) hours. 21 capsule 0   Blood Pressure Monitoring (BLOOD PRESSURE KIT) KIT Use to check blood pressure twice daily and records BP logs nd bring to your  office visits     cetirizine (ZYRTEC) 10 MG tablet Take 1 tablet (10 mg total) by mouth daily. 30 tablet 5   Cholecalciferol (VITAMIN D3) 125 MCG (5000 UT) CAPS Take 1 capsule by mouth daily.     clindamycin (CLEOCIN T) 1 % lotion Apply to cleansed face daily in the morning. 60 mL 1   famotidine (PEPCID) 40 MG tablet Take 1 tablet (40 mg total) by mouth at bedtime. 90 tablet 0   Ferrous Sulfate (IRON) 28 MG TABS Take 1 tablet by mouth daily.     fexofenadine (ALLEGRA) 180 MG tablet Take 1 tablet (180 mg total) by mouth daily. 90 tablet 3   fluconazole (DIFLUCAN) 150 MG tablet Take 1 tablet (150 mg total) by mouth every 3 (three) days. 2 tablet 0   fluticasone (FLONASE) 50 MCG/ACT nasal spray Place 1 spray into both nostrils daily. Begin by using 2 sprays in each nare daily for 3 to 5 days, then decrease to 1 spray in each nare daily. 18 mL 5   fosfomycin (MONUROL) 3 g PACK SMARTSIG:3 Gram(s) By Mouth Once     gemfibrozil (LOPID) 600 MG tablet Take by mouth.     Guaifenesin 1200 MG TB12 Take 1 tablet (1,200 mg total) by mouth in the morning and at bedtime. 14 tablet 0   hydrOXYzine (ATARAX) 25 MG tablet TAKE 1 TABLET BY MOUTH THREE TIMES A DAY AS NEEDED FOR ANXIETY FOR UP TO 90 DAYS     ipratropium (ATROVENT) 0.06 % nasal spray Place 2 sprays into both nostrils 3 (three) times daily. As needed for nasal congestion, runny nose 15 mL 2   meloxicam (MOBIC) 15 MG tablet Take 1 tablet (15 mg total) by mouth daily. 30 tablet 3   methocarbamol (ROBAXIN) 500 MG tablet Take 1 tablet (500 mg total) by mouth 2 (two) times daily. 20 tablet 0   methylPREDNISolone (MEDROL DOSEPAK) 4 MG TBPK tablet 6 day dose pack - take as directed 21 tablet 0   montelukast (SINGULAIR) 10 MG tablet Take 1 tablet (10 mg total) by mouth at bedtime. 90 tablet 0   Na Sulfate-K Sulfate-Mg Sulf 17.5-3.13-1.6 GM/177ML SOLN See admin instructions.     naproxen (NAPROSYN) 375 MG tablet Take 1 tablet (375 mg total) by mouth 2 (two) times  daily as needed. 20 tablet 0   ondansetron (ZOFRAN) 4 MG tablet PLEASE SEE ATTACHED FOR DETAILED DIRECTIONS     ondansetron (ZOFRAN-ODT) 4 MG disintegrating tablet DISSOLVE TAKE 1 TABLET BY MOUTH EVERY 8 HOURS AS NEEDED FOR NAUSEA AND VOMITING 20 tablet 4   ondansetron (ZOFRAN-ODT) 4 MG disintegrating tablet Take by mouth.     pantoprazole (PROTONIX) 40 MG tablet Take 1 tablet (40 mg total) by mouth daily. 30 tablet 6   polyethylene glycol powder (GLYCOLAX/MIRALAX) 17 GM/SCOOP powder Take 17 g by mouth daily as needed for mild constipation. 238 g 0   promethazine-dextromethorphan (PROMETHAZINE-DM) 6.25-15 MG/5ML syrup Take 5 mLs by mouth at bedtime as needed for cough. 118 mL 0   pseudoephedrine (SUDAFED) 60 MG tablet Take 1 tablet (60 mg total) by mouth  every 8 (eight) hours as needed for congestion. 30 tablet 0   SUMAtriptan (IMITREX) 100 MG tablet Take 1 tablet at earliest onset of migraine.  May repeat in 2 hours if needed.  Maximum 2 tablets in 24 hours. 10 tablet 5   topiramate (TOPAMAX) 25 MG tablet Take 1 tablet (25 mg total) by mouth at bedtime. 30 tablet 5   tretinoin (RETIN-A) 0.025 % cream Apply pea size amount to cleansed face every other day at bedtime. Increase to every night as tolerated. 20 g 0   triamcinolone cream (KENALOG) 0.1 % Apply to affected areas twice daily for 3 days on and 3 days off cycle or as needed. 80 g 0   triamcinolone ointment (KENALOG) 0.5 % 1 application 2 times daily as needed to itchy/ inflamed areas from allergy injections. 30 g 1   Vitamin D, Ergocalciferol, (DRISDOL) 1.25 MG (50000 UNIT) CAPS capsule Take by mouth.     No current facility-administered medications on file prior to visit.    ALLERGIES: Allergies  Allergen Reactions   Macrobid [Nitrofurantoin] Nausea And Vomiting   Augmentin [Amoxicillin-Pot Clavulanate]     Pt states it makes her sick   Cefdinir Itching   Metformin And Related Nausea And Vomiting    FAMILY HISTORY: Family History   Problem Relation Age of Onset   Hypertension Mother    Hypertension Father    Breast cancer Maternal Grandmother        87   Colon cancer Neg Hx    Colon polyps Neg Hx    Kidney disease Neg Hx    Diabetes Neg Hx    Esophageal cancer Neg Hx    Gallbladder disease Neg Hx    Heart disease Neg Hx    Asthma Neg Hx    Stroke Neg Hx    Stomach cancer Neg Hx       Objective:  *** General: No acute distress.  Patient appears well-groomed.   Head:  Normocephalic/atraumatic Eyes:  Fundi examined but not visualized Neck: supple, no paraspinal tenderness, full range of motion Heart:  Regular rate and rhythm Neurological Exam: ***   Shon Millet, DO  CC: Ivory Broad, MD

## 2022-11-17 ENCOUNTER — Ambulatory Visit: Payer: Medicaid Other | Admitting: Neurology

## 2022-11-17 ENCOUNTER — Encounter: Payer: Self-pay | Admitting: Neurology

## 2022-11-19 ENCOUNTER — Ambulatory Visit (INDEPENDENT_AMBULATORY_CARE_PROVIDER_SITE_OTHER): Payer: Medicaid Other

## 2022-11-19 DIAGNOSIS — J309 Allergic rhinitis, unspecified: Secondary | ICD-10-CM | POA: Diagnosis not present

## 2022-11-24 ENCOUNTER — Ambulatory Visit (INDEPENDENT_AMBULATORY_CARE_PROVIDER_SITE_OTHER): Payer: Medicaid Other

## 2022-11-24 DIAGNOSIS — J309 Allergic rhinitis, unspecified: Secondary | ICD-10-CM

## 2022-11-29 ENCOUNTER — Ambulatory Visit
Admission: EM | Admit: 2022-11-29 | Discharge: 2022-11-29 | Disposition: A | Discharge status: Home / Self Care | Payer: Medicaid Other | Attending: Physician Assistant | Admitting: Physician Assistant

## 2022-11-29 DIAGNOSIS — M545 Low back pain, unspecified: Secondary | ICD-10-CM | POA: Insufficient documentation

## 2022-11-29 DIAGNOSIS — N898 Other specified noninflammatory disorders of vagina: Secondary | ICD-10-CM | POA: Insufficient documentation

## 2022-11-29 LAB — POCT URINALYSIS DIP (MANUAL ENTRY)
Bilirubin, UA: NEGATIVE
Glucose, UA: NEGATIVE mg/dL
Ketones, POC UA: NEGATIVE mg/dL
Leukocytes, UA: NEGATIVE
Nitrite, UA: NEGATIVE
Protein Ur, POC: NEGATIVE mg/dL
Spec Grav, UA: 1.015 (ref 1.010–1.025)
Urobilinogen, UA: 0.2 U/dL
pH, UA: 6.5 (ref 5.0–8.0)

## 2022-11-29 LAB — POCT URINE PREGNANCY: Preg Test, Ur: NEGATIVE

## 2022-11-29 MED ORDER — BACLOFEN 10 MG PO TABS: 10.0000 mg | ORAL_TABLET | Freq: Two times a day (BID) | ORAL | 0 refills | Status: DC | PRN

## 2022-11-29 MED ORDER — METRONIDAZOLE 500 MG PO TABS: 500.0000 mg | ORAL_TABLET | Freq: Two times a day (BID) | ORAL | 0 refills | Status: DC

## 2022-11-29 MED ORDER — FLUCONAZOLE 150 MG PO TABS: 150.0000 mg | ORAL_TABLET | Freq: Once | ORAL | 0 refills | Status: AC

## 2022-11-29 MED ORDER — KETOROLAC TROMETHAMINE 30 MG/ML IJ SOLN
30.0000 mg | Freq: Once | INTRAMUSCULAR | Status: AC
  Administered 2022-11-29: 30 mg via INTRAMUSCULAR

## 2022-11-29 NOTE — Discharge Instructions (Signed)
Your urine showed no evidence of infection.  I am more concerned about a vaginal infection.  Please start metronidazole twice daily for 7 days.  Do not drink any alcohol while on this medication and for 3 days after finishing course.  Take Diflucan once.  We will contact you if any of your testing is abnormal we need to arrange additional treatment.  Wear loosefitting cotton underwear and use hypoallergenic soaps and detergents.  If you develop any worsening abdominal pain, pelvic pain, fever, nausea, vomiting you need to be seen immediately.  Follow-up with OB/GYN as we discussed.  I think your back pain is related to muscles.  We gave you injection of Toradol today.  Do not take any NSAIDs for the next 24 hours including aspirin, ibuprofen/Advil, naproxen/Aleve.  You can use Tylenol/acetaminophen as needed.  Take baclofen up to twice a day to help with your pain.  This will make you sleepy so do not drive or drink alcohol while taking it.  Follow-up with your primary care.  If anything worsens please be seen immediately.

## 2022-11-29 NOTE — ED Provider Notes (Signed)
[facility]    CSN: [id number] Arrival date & time: 11/29/22  0801      History   Chief Complaint Chief Complaint  Patient presents with   UTI Symptoms   Headache   Back Pain    HPI Sherri [name] is a 37 y.o. female.   Patient presents today with a weeklong history of lower back pain.  She reports associated urinary symptoms including frequency and some mild dysuria.  She denies any pelvic pain but does have some vaginal discharge that she describes as thick and white.  She has been seen and treated for similar symptoms recently; last treated 10/17/2022 for bacterial vaginosis.  She is no specific concern for STI.  She denies any known injury increase in activity prior to symptom onset but does report she works at [employer] so does a lot of heavy lifting.  She denies any fever, nausea, vomiting.  Pain is rated 8 on a 0-10 pain scale, described as aching, no alleviating factors identified.  She has no concern for pregnancy.  She does have a history of diabetes but does not take any SGLT2 inhibitors.  Her diabetes are is well-controlled with A1c of 5.7%.  Denies any recent urogenital procedures or self-catheterization.  Denies any bowel/bladder incontinence, lower extremity weakness, saddle anesthesia.    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    Urinary tract infection    Uterine fibroid     Patient Active Problem List   Diagnosis Date Noted   Moderate episode of recurrent major depressive disorder (HCC) 07/16/2022   Pre-diabetes 07/23/2021   Anxiety and depression 06/24/2021   Fibromyalgia 06/24/2021   Chronic sinusitis 12/26/2020   Sinus pressure 12/26/2020   Prediabetes 07/05/2020   Gastroesophageal reflux disease 04/26/2020   Perianal rash 03/26/2020   Chronic  nausea 03/26/2020   Chronic constipation 03/09/2019   Pelvic pain in female 03/09/2019   Hypertension    Genital herpes    Uterine fibroid 12/25/2013   Obesity 12/25/2013   Bacterial vaginitis 04/27/2012    Past Surgical History:  Procedure Laterality Date   CESAREAN SECTION N/A 06/05/2014   Procedure: CESAREAN SECTION;  Surgeon: Levie Heritage, DO;  Location: [facility];  Service: Obstetrics;  Laterality: N/A;   WISDOM TOOTH EXTRACTION      OB History     Gravida  1   Para  1   Term      Preterm  1   AB      Living  2      SAB      IAB      Ectopic      Multiple  1   Live Births  2            Home Medications    Prior to Admission medications   Medication Sig Start Date End Date Taking? Authorizing Provider  amitriptyline (ELAVIL) 50 MG tablet Take 50 mg by mouth at bedtime. 06/19/22  Yes [provider]  baclofen (LIORESAL) 10 MG tablet Take 1 tablet (10 mg total) by mouth 2 (two) times daily as needed for muscle spasms. 11/29/22  Yes Anitta Tenny K, PA-C  cetirizine (ZYRTEC) 10 MG tablet Take 1 tablet (10 mg total) by mouth daily. 07/01/22  Yes Padgett,  Pilar Grammes, MD  fluconazole (DIFLUCAN) 150 MG tablet Take 1 tablet (150 mg total) by mouth once for 1 dose. 11/29/22 11/29/22 Yes Margarethe Virgen K, PA-C  metroNIDAZOLE (FLAGYL) 500 MG tablet Take 1 tablet (500 mg total) by mouth 2 (two) times daily. 11/29/22  Yes Xin Klawitter K, PA-C  ondansetron (ZOFRAN-ODT) 4 MG disintegrating tablet Take by mouth. 08/27/22  Yes [provider]  amitriptyline (ELAVIL) 25 MG tablet Take 2 tablets (50 mg total) by mouth at bedtime. 06/24/21 08/07/22  Mayers, Kasandra Knudsen, PA-C  amitriptyline (ELAVIL) 75 MG tablet Take by mouth. 08/28/22 11/26/22  [provider]  amLODipine (NORVASC) 5 MG tablet Take 1 tablet by mouth daily. 08/11/22   [provider]  benzonatate (TESSALON) 100 MG capsule Take 1 capsule (100 mg total) by mouth every 8 (eight) hours.  10/17/22   Carlisle Beers, FNP  Blood Pressure Monitoring (BLOOD PRESSURE KIT) KIT Use to check blood pressure twice daily and records BP logs nd bring to your office visits 08/03/22   [provider]  Cholecalciferol (VITAMIN D3) 125 MCG (5000 UT) CAPS Take 1 capsule by mouth daily.    [provider]  clindamycin (CLEOCIN T) 1 % lotion Apply to cleansed face daily in the morning. 06/30/21     famotidine (PEPCID) 40 MG tablet Take 1 tablet (40 mg total) by mouth at bedtime. 01/27/22 08/07/22  Theadora Rama Scales, PA-C  Ferrous Sulfate (IRON) 28 MG TABS Take 1 tablet by mouth daily.    [provider]  fexofenadine (ALLEGRA) 180 MG tablet Take 1 tablet (180 mg total) by mouth daily. 03/08/22   Wallis Bamberg, PA-C  fluticasone (FLONASE) 50 MCG/ACT nasal spray Place 1 spray into both nostrils daily. Begin by using 2 sprays in each nare daily for 3 to 5 days, then decrease to 1 spray in each nare daily. 03/08/22   Wallis Bamberg, PA-C  fosfomycin (MONUROL) 3 g PACK SMARTSIG:3 Gram(s) By Mouth Once 06/16/22   [provider]  gemfibrozil (LOPID) 600 MG tablet Take by mouth. 06/01/22   [provider]  Guaifenesin 1200 MG TB12 Take 1 tablet (1,200 mg total) by mouth in the morning and at bedtime. 10/17/22   Carlisle Beers, FNP  hydrOXYzine (ATARAX) 25 MG tablet TAKE 1 TABLET BY MOUTH THREE TIMES A DAY AS NEEDED FOR ANXIETY FOR UP TO 90 DAYS 08/11/22   [provider]  ipratropium (ATROVENT) 0.06 % nasal spray Place 2 sprays into both nostrils 3 (three) times daily. As needed for nasal congestion, runny nose 01/27/22   Theadora Rama Scales, PA-C  meloxicam (MOBIC) 15 MG tablet Take 1 tablet (15 mg total) by mouth daily. 09/22/22   McDonald, Rachelle Hora, DPM  methocarbamol (ROBAXIN) 500 MG tablet Take 1 tablet (500 mg total) by mouth 2 (two) times daily. 09/11/22   Bing Neighbors, NP  methylPREDNISolone (MEDROL DOSEPAK) 4 MG TBPK tablet 6 day dose pack - take  as directed 11/09/22   Edwin Cap, DPM  montelukast (SINGULAIR) 10 MG tablet Take 1 tablet (10 mg total) by mouth at bedtime. 01/27/22 08/07/22  Theadora Rama Scales, PA-C  Na Sulfate-K Sulfate-Mg Sulf 17.5-3.13-1.6 GM/177ML SOLN See admin instructions. 04/10/22   [provider]  naproxen (NAPROSYN) 375 MG tablet Take 1 tablet (375 mg total) by mouth 2 (two) times daily as needed. 09/11/22   Bing Neighbors, NP  ondansetron (ZOFRAN) 4 MG tablet PLEASE SEE ATTACHED FOR DETAILED DIRECTIONS 07/08/22  [provider]  ondansetron (ZOFRAN-ODT) 4 MG disintegrating tablet DISSOLVE TAKE 1 TABLET BY MOUTH EVERY 8 HOURS AS NEEDED FOR NAUSEA AND VOMITING 08/11/22   Everlena Cooper, Adam R, DO  pantoprazole (PROTONIX) 40 MG tablet Take 1 tablet (40 mg total) by mouth daily. 11/17/21   Meredith Pel, NP  polyethylene glycol powder (GLYCOLAX/MIRALAX) 17 GM/SCOOP powder Take 17 g by mouth daily as needed for mild constipation. 09/27/21   Renne Crigler, PA-C  promethazine-dextromethorphan (PROMETHAZINE-DM) 6.25-15 MG/5ML syrup Take 5 mLs by mouth at bedtime as needed for cough. 10/17/22   Carlisle Beers, FNP  pseudoephedrine (SUDAFED) 60 MG tablet Take 1 tablet (60 mg total) by mouth every 8 (eight) hours as needed for congestion. 03/08/22   Wallis Bamberg, PA-C  SUMAtriptan (IMITREX) 100 MG tablet Take 1 tablet at earliest onset of migraine.  May repeat in 2 hours if needed.  Maximum 2 tablets in 24 hours. 04/22/22   Drema Dallas, DO  topiramate (TOPAMAX) 25 MG tablet Take 1 tablet (25 mg total) by mouth at bedtime. 04/22/22   Drema Dallas, DO  tretinoin (RETIN-A) 0.025 % cream Apply pea size amount to cleansed face every other day at bedtime. Increase to every night as tolerated. 06/30/21     triamcinolone cream (KENALOG) 0.1 % Apply to affected areas twice daily for 3 days on and 3 days off cycle or as needed. 06/30/21     triamcinolone ointment (KENALOG) 0.5 % 1 application 2 times daily as needed to  itchy/ inflamed areas from allergy injections. 08/11/22   Marcelyn Bruins, MD  Vitamin D, Ergocalciferol, (DRISDOL) 1.25 MG (50000 UNIT) CAPS capsule Take by mouth. 07/07/22   [provider]    Family History Family History  Problem Relation Age of Onset   Hypertension Mother    Hypertension Father    Breast cancer Maternal Grandmother        59   Colon cancer Neg Hx    Colon polyps Neg Hx    Kidney disease Neg Hx    Diabetes Neg Hx    Esophageal cancer Neg Hx    Gallbladder disease Neg Hx    Heart disease Neg Hx    Asthma Neg Hx    Stroke Neg Hx    Stomach cancer Neg Hx     Social History Social History   Tobacco Use   Smoking status: Never    Passive exposure: Never   Smokeless tobacco: Never  Vaping Use   Vaping status: Never Used  Substance Use Topics   Alcohol use: No   Drug use: Never     Allergies   Macrobid [nitrofurantoin], Augmentin [amoxicillin-pot clavulanate], Cefdinir, and Metformin and related   Review of Systems Review of Systems  Constitutional:  Positive for activity change. Negative for appetite change, fatigue and fever.  Respiratory:  Negative for shortness of breath.   Cardiovascular:  Negative for chest pain.  Gastrointestinal:  Negative for abdominal pain, diarrhea, nausea and vomiting.  Genitourinary:  Positive for dysuria, frequency and vaginal discharge. Negative for flank pain, genital sores, hematuria, urgency, vaginal bleeding and vaginal pain.  Musculoskeletal:  Positive for back pain. Negative for arthralgias and myalgias.     Physical Exam Triage Vital Signs ED Triage Vitals  Encounter Vitals Group     BP 11/29/22 0812 126/83     Systolic BP Percentile --      Diastolic BP Percentile --      Pulse Rate 11/29/22 0812 Marland Kitchen)  106     Resp 11/29/22 0812 18     Temp 11/29/22 0812 98.9 F (37.2 C)     Temp Source 11/29/22 0812 Oral     SpO2 11/29/22 0812 96 %     Weight 11/29/22 0810 170 lb (77.1 kg)      Height 11/29/22 0810 4\' 11"  (1.499 m)     Head Circumference --      Peak Flow --      Pain Score 11/29/22 0807 8     Pain Loc --      Pain Education --      Exclude from Growth Chart --    No data found.  Updated Vital Signs BP 126/83 (BP Location: Left Arm)   Pulse 74   Temp 98.9 F (37.2 C) (Oral)   Resp 18   Ht 4\' 11"  (1.499 m)   Wt 170 lb (77.1 kg)   LMP 11/21/2022 (Exact Date)   SpO2 98%   BMI 34.34 kg/m   Visual Acuity Right Eye Distance:   Left Eye Distance:   Bilateral Distance:    Right Eye Near:   Left Eye Near:    Bilateral Near:     Physical Exam Vitals reviewed. Exam conducted with a chaperone present.  Constitutional:      General: She is awake. She is not in acute distress.    Appearance: Normal appearance. She is well-developed. She is not ill-appearing.     Comments: Very pleasant female appears stated age in no acute distress sitting comfortably in exam room  HENT:     Head: Normocephalic and atraumatic.  Cardiovascular:     Rate and Rhythm: Normal rate and regular rhythm.     Heart sounds: Normal heart sounds, S1 normal and S2 normal. No murmur heard. Pulmonary:     Effort: Pulmonary effort is normal.     Breath sounds: Normal breath sounds. No wheezing, rhonchi or rales.     Comments: Clear to auscultation bilaterally Abdominal:     General: Bowel sounds are normal.     Palpations: Abdomen is soft.     Tenderness: There is no abdominal tenderness. There is no right CVA tenderness, left CVA tenderness, guarding or rebound.     Comments: Benign abdominal exam  Genitourinary:    Labia:        Right: No rash or tenderness.        Left: No rash or tenderness.      Vagina: Vaginal discharge present. No erythema or tenderness.     Cervix: Normal.     Uterus: Normal.      Adnexa: Right adnexa normal and left adnexa normal.     Comments: Laneta Simmers, RN present as chaperone during exam.  Patient had copious milky discharge in posterior  vaginal vault and on vaginal walls.  She had some tenderness with bimanual exam at the introitus but no significant CMT or adnexal tenderness. Musculoskeletal:     Cervical back: No tenderness or bony tenderness.     Thoracic back: Tenderness present. No bony tenderness.     Lumbar back: Tenderness present. No bony tenderness. Negative right straight leg raise test and negative left straight leg raise test.     Comments: Back: No pain percussion of vertebrae.  No deformity or step-off noted.  Tenderness palpation of bilateral thoracic and lumbar paraspinal muscles.  Negative straight leg raise bilaterally.  Strength 5/5 bilateral lower extremities.  Psychiatric:        Behavior:  Behavior is cooperative.      UC Treatments / Results  Labs (all labs ordered are listed, but only abnormal results are displayed) Labs Reviewed  POCT URINALYSIS DIP (MANUAL ENTRY) - Abnormal; Notable for the following components:      Result Value   Blood, UA trace-intact (*)    All other components within normal limits  POCT URINE PREGNANCY  CERVICOVAGINAL ANCILLARY ONLY    EKG   Radiology No results found.  Procedures Procedures (including critical care time)  Medications Ordered in UC Medications  ketorolac (TORADOL) 30 MG/ML injection 30 mg (30 mg Intramuscular Given 11/29/22 0832)    Initial Impression / Assessment and Plan / UC Course  I have reviewed the triage vital signs and the nursing notes.  Pertinent labs & imaging results that were available during my care of the patient were reviewed by me and considered in my medical decision making (see chart for details).     Patient was initially tachycardic but this improved after she was given Toradol and was allowed to rest for several minutes to 74 bpm.  She is otherwise afebrile, nontoxic, well-appearing.  Urine had trace blood but I suspect this is related to irritation as it has been present for several months.  There is no evidence of  infection.  Urine pregnancy was negative.  She did not have significant adnexal tenderness on exam so will defer treatment for PID but I am concerned for vaginal infection causing her symptoms.  Will treat with metronidazole twice daily with instruction not to drink alcohol with this medication for 3 days after completing course.  STI swab was collected and is pending.  We will contact her if this is positive.  She was also given a dose of Diflucan.  Recommended to use hypoallergenic soaps and detergents.  She is to rest and drink plenty of fluid.  We discussed that her back pain seems to be more muscular in etiology.  She was given Toradol 30 mg in clinic with improvement of symptoms.  She is not to take NSAIDs for the next 24 hours.  She was given baclofen for additional pain relief and we discussed that this can be sedating so she is not to drive or drink alcohol taking it.  She is to use heat, rest, stretch for additional symptom relief.  Recommend close follow-up with her primary care if her symptoms are not improving.  We discussed that if she has any worsening or changing symptoms she needs to be seen immediately.  Strict return precautions given.  Work excuse note provided.  Final Clinical Impressions(s) / UC Diagnoses   Final diagnoses:  Vaginal discharge  Acute bilateral low back pain without sciatica     Discharge Instructions      Your urine showed no evidence of infection.  I am more concerned about a vaginal infection.  Please start metronidazole twice daily for 7 days.  Do not drink any alcohol while on this medication and for 3 days after finishing course.  Take Diflucan once.  We will contact you if any of your testing is abnormal we need to arrange additional treatment.  Wear loosefitting cotton underwear and use hypoallergenic soaps and detergents.  If you develop any worsening abdominal pain, pelvic pain, fever, nausea, vomiting you need to be seen immediately.  Follow-up with OB/GYN  as we discussed.  I think your back pain is related to muscles.  We gave you injection of Toradol today.  Do not take any  NSAIDs for the next 24 hours including aspirin, ibuprofen/Advil, naproxen/Aleve.  You can use Tylenol/acetaminophen as needed.  Take baclofen up to twice a day to help with your pain.  This will make you sleepy so do not drive or drink alcohol while taking it.  Follow-up with your primary care.  If anything worsens please be seen immediately.     ED Prescriptions     Medication Sig Dispense Auth. Provider   metroNIDAZOLE (FLAGYL) 500 MG tablet Take 1 tablet (500 mg total) by mouth 2 (two) times daily. 14 tablet Trang Bouse K, PA-C   fluconazole (DIFLUCAN) 150 MG tablet Take 1 tablet (150 mg total) by mouth once for 1 dose. 1 tablet Eryx Zane K, PA-C   baclofen (LIORESAL) 10 MG tablet Take 1 tablet (10 mg total) by mouth 2 (two) times daily as needed for muscle spasms. 14 tablet Kaylie Ritter, Noberto Retort, PA-C      PDMP not reviewed this encounter.   Jeani Hawking, PA-C 11/29/22 2956

## 2022-11-29 NOTE — ED Triage Notes (Signed)
"  Starting Monday with ha, back pain, urinary urgency and nausea". Those symptoms remain. No vomiting. No URI symptoms. No fever. No abd pain "just upset".

## 2022-11-30 ENCOUNTER — Encounter: Payer: Self-pay | Admitting: Podiatry

## 2022-12-01 ENCOUNTER — Ambulatory Visit (INDEPENDENT_AMBULATORY_CARE_PROVIDER_SITE_OTHER): Payer: Medicaid Other | Admitting: *Deleted

## 2022-12-01 DIAGNOSIS — J309 Allergic rhinitis, unspecified: Secondary | ICD-10-CM | POA: Diagnosis not present

## 2022-12-01 LAB — CERVICOVAGINAL ANCILLARY ONLY
Bacterial Vaginitis (gardnerella): POSITIVE — AB
Candida Glabrata: NEGATIVE
Candida Vaginitis: NEGATIVE
Chlamydia: NEGATIVE
Comment: NEGATIVE
Comment: NEGATIVE
Comment: NEGATIVE
Comment: NEGATIVE
Comment: NEGATIVE
Comment: NORMAL
Neisseria Gonorrhea: NEGATIVE
Trichomonas: NEGATIVE

## 2022-12-02 ENCOUNTER — Telehealth: Payer: Self-pay

## 2022-12-02 NOTE — Telephone Encounter (Signed)
Received a voicemail message from [facility] [place]. Patient's insurance approved the MRI of the Right heel, but denied the MRI Left heel. They have notified the patient [facility] [phone]

## 2022-12-03 ENCOUNTER — Ambulatory Visit: Payer: Medicaid Other | Admitting: Obstetrics and Gynecology

## 2022-12-05 ENCOUNTER — Other Ambulatory Visit: Payer: Medicaid Other

## 2022-12-05 ENCOUNTER — Ambulatory Visit
Admission: RE | Admit: 2022-12-05 | Discharge: 2022-12-05 | Disposition: A | Discharge status: Home / Self Care | Payer: Medicaid Other | Source: Ambulatory Visit | Attending: Podiatry | Admitting: Podiatry

## 2022-12-05 DIAGNOSIS — M722 Plantar fascial fibromatosis: Secondary | ICD-10-CM

## 2022-12-10 ENCOUNTER — Ambulatory Visit (INDEPENDENT_AMBULATORY_CARE_PROVIDER_SITE_OTHER): Payer: Medicaid Other

## 2022-12-10 DIAGNOSIS — J309 Allergic rhinitis, unspecified: Secondary | ICD-10-CM

## 2022-12-15 ENCOUNTER — Other Ambulatory Visit: Payer: Self-pay | Admitting: Neurology

## 2022-12-16 ENCOUNTER — Ambulatory Visit (INDEPENDENT_AMBULATORY_CARE_PROVIDER_SITE_OTHER): Payer: Self-pay | Admitting: *Deleted

## 2022-12-16 DIAGNOSIS — J309 Allergic rhinitis, unspecified: Secondary | ICD-10-CM | POA: Diagnosis not present

## 2022-12-18 NOTE — Telephone Encounter (Signed)
Note sent to front to call for appointment then  Dr. Evaristo Bury refill.

## 2022-12-22 ENCOUNTER — Ambulatory Visit (INDEPENDENT_AMBULATORY_CARE_PROVIDER_SITE_OTHER): Payer: Medicaid Other | Admitting: *Deleted

## 2022-12-22 DIAGNOSIS — J309 Allergic rhinitis, unspecified: Secondary | ICD-10-CM

## 2022-12-28 DIAGNOSIS — J301 Allergic rhinitis due to pollen: Secondary | ICD-10-CM | POA: Diagnosis not present

## 2022-12-28 NOTE — Progress Notes (Unsigned)
Vials Made 12/28/22 LS

## 2022-12-29 ENCOUNTER — Ambulatory Visit (HOSPITAL_COMMUNITY)
Admission: EM | Admit: 2022-12-29 | Discharge: 2022-12-29 | Disposition: A | Discharge status: Home / Self Care | Payer: Medicaid Other | Attending: Family Medicine | Admitting: Family Medicine

## 2022-12-29 ENCOUNTER — Encounter (HOSPITAL_COMMUNITY): Payer: Self-pay

## 2022-12-29 DIAGNOSIS — R051 Acute cough: Secondary | ICD-10-CM

## 2022-12-29 DIAGNOSIS — R110 Nausea: Secondary | ICD-10-CM | POA: Diagnosis not present

## 2022-12-29 DIAGNOSIS — J069 Acute upper respiratory infection, unspecified: Secondary | ICD-10-CM | POA: Diagnosis not present

## 2022-12-29 DIAGNOSIS — J302 Other seasonal allergic rhinitis: Secondary | ICD-10-CM | POA: Diagnosis not present

## 2022-12-29 LAB — POCT INFLUENZA A/B
Influenza A, POC: NEGATIVE
Influenza B, POC: NEGATIVE

## 2022-12-29 MED ORDER — PROMETHAZINE-DM 6.25-15 MG/5ML PO SYRP: 5.0000 mL | ORAL_SOLUTION | Freq: Four times a day (QID) | ORAL | 0 refills | Status: DC | PRN

## 2022-12-29 NOTE — ED Triage Notes (Signed)
Patient c/o bilateral ear pain, nasal congestion, headache, sore throat, and nausea x 3 days.  Patient states she has had Tylenol at 0730 today.

## 2022-12-30 NOTE — ED Provider Notes (Signed)
[facility]   [id number] 12/29/22 Arrival Time: 1715  ASSESSMENT & PLAN:  1. Viral upper respiratory tract infection   2. Acute cough   3. Nausea without vomiting    Discussed typical duration of likely viral illness. Results for orders placed or performed during the hospital encounter of 12/29/22  POC Influenza A/B  Result Value Ref Range   Influenza A, POC Negative Negative   Influenza B, POC Negative Negative     OTC symptom care as needed.    Follow-up Information     Coccaro, Althea Grimmer, MD.   Specialty: Pediatrics Why: As needed. Contact information: [address] [place] [address] [phone]         [facility] [place].   Specialty: Urgent Care Why: If worsening or failing to improve as anticipated. Contact information: [address] [place] [address] [phone]                Reviewed expectations re: course of current medical issues. Questions answered. Outlined signs and symptoms indicating need for more acute intervention. Understanding verbalized. After Visit Summary given.   SUBJECTIVE: History from: Patient. Sherri [name] is a 37 y.o. female. Patient c/o bilateral ear pain, nasal congestion, headache, sore throat, and nausea x 3 days.  Patient states she has had Tylenol at 0730 today. Denies: difficulty breathing. Normal PO intake without n/v/d.  OBJECTIVE:  Vitals:   12/29/22 1835  BP: (!) 147/85  Pulse: (!) 102  Resp: 16  Temp: 100.3 F (37.9 C)  TempSrc: Oral  SpO2: 97%    Slight tachycardia noted. General appearance: alert; no distress Eyes: PERRLA; EOMI; conjunctiva normal HENT: [place]; AT; with nasal congestion Neck: supple  Lungs: speaks full sentences without difficulty; unlabored Extremities: no edema Skin: warm and dry Neurologic: normal gait Psychological: alert and cooperative; normal mood and affect  Labs: Results for orders placed or  performed during the hospital encounter of 12/29/22  POC Influenza A/B  Result Value Ref Range   Influenza A, POC Negative Negative   Influenza B, POC Negative Negative   *Note: Due to a large number of results and/or encounters for the requested time period, some results have not been displayed. A complete set of results can be found in Results Review.   Labs Reviewed  POCT INFLUENZA A/B    Imaging: No results found.  Allergies  Allergen Reactions   Macrobid [Nitrofurantoin] Nausea And Vomiting   Augmentin [Amoxicillin-Pot Clavulanate]     Pt states it makes her sick   Cefdinir Itching   Metformin And Related Nausea And Vomiting    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    Urinary tract infection    Uterine fibroid    Social History   Socioeconomic History   Marital status: Single    Spouse name: Not on file   Number of children: Not on file   Years of education: Not on file   Highest education level: Not on file  Occupational History   Occupation: [employer]  Tobacco Use   Smoking status: Never    Passive exposure: Never   Smokeless tobacco: Never  Vaping Use   Vaping status: Never Used  Substance and Sexual Activity   Alcohol use: No  Drug use: Never   Sexual activity: Yes    Birth control/protection: None  Other Topics Concern   Not on file  Social History Narrative   Are you right handed or left handed? Right   Are you currently employed ? Yes   What is your current occupation? [employer]   Do you live at home alone? Yes    Who lives with you?    What type of home do you live in: 1 story or 2 story? 1a      Social Determinants of Health   Financial Resource Strain: Not on File (02/03/2022)   Received from [facility] [facility], [place] Strain     Financial Resource Strain: 0  Food Insecurity: Not at Risk (02/26/2022)   Received from [place], [place]   Food Insecurity    Food: 1  Transportation Needs: Not at Risk (02/26/2022)   Received from [place], [employer] Needs    Transportation: 1  Physical Activity: Not on File (02/03/2022)   Received from [place], [place]   Physical Activity    Physical Activity: 0  Stress: Not on File (02/03/2022)   Received from [facility], [place]   Stress    Stress: 0  Social Connections: Not on File (11/09/2022)   Received from [facility]   Social Connections    Connectedness: 0  Intimate Partner Violence: Not on file   Family History  Problem Relation Age of Onset   Hypertension Mother    Hypertension Father    Breast cancer Maternal Grandmother        47   Colon cancer Neg Hx    Colon polyps Neg Hx    Kidney disease Neg Hx    Diabetes Neg Hx    Esophageal cancer Neg Hx    Gallbladder disease Neg Hx    Heart disease Neg Hx    Asthma Neg Hx    Stroke Neg Hx    Stomach cancer Neg Hx    Past Surgical History:  Procedure Laterality Date   CESAREAN SECTION N/A 06/05/2014   Procedure: CESAREAN SECTION;  Surgeon: Levie Heritage, DO;  Location: [facility];  Service: Obstetrics;  Laterality: N/A;   WISDOM TOOTH EXTRACTION       Mardella Layman, MD 12/30/22 1029

## 2022-12-31 ENCOUNTER — Ambulatory Visit (INDEPENDENT_AMBULATORY_CARE_PROVIDER_SITE_OTHER): Payer: Medicaid Other

## 2022-12-31 DIAGNOSIS — J309 Allergic rhinitis, unspecified: Secondary | ICD-10-CM

## 2022-12-31 MED ORDER — EPINEPHRINE 0.3 MG/0.3ML IJ SOAJ: 0.3000 mg | INTRAMUSCULAR | 1 refills | Status: DC | PRN

## 2023-01-04 ENCOUNTER — Ambulatory Visit: Payer: Medicaid Other | Admitting: Podiatry

## 2023-01-05 ENCOUNTER — Ambulatory Visit: Payer: Medicaid Other | Admitting: Family

## 2023-01-05 ENCOUNTER — Other Ambulatory Visit: Payer: Self-pay

## 2023-01-05 ENCOUNTER — Ambulatory Visit (INDEPENDENT_AMBULATORY_CARE_PROVIDER_SITE_OTHER): Payer: Self-pay | Admitting: *Deleted

## 2023-01-05 ENCOUNTER — Encounter: Payer: Self-pay | Admitting: Allergy and Immunology

## 2023-01-05 ENCOUNTER — Ambulatory Visit (INDEPENDENT_AMBULATORY_CARE_PROVIDER_SITE_OTHER): Payer: Medicaid Other | Admitting: Allergy and Immunology

## 2023-01-05 VITALS — BP 138/64 | HR 76 | Temp 98.5°F | Ht 59.0 in | Wt 194.6 lb

## 2023-01-05 DIAGNOSIS — J3089 Other allergic rhinitis: Secondary | ICD-10-CM | POA: Diagnosis not present

## 2023-01-05 DIAGNOSIS — K219 Gastro-esophageal reflux disease without esophagitis: Secondary | ICD-10-CM | POA: Diagnosis not present

## 2023-01-05 DIAGNOSIS — J309 Allergic rhinitis, unspecified: Secondary | ICD-10-CM

## 2023-01-05 DIAGNOSIS — J301 Allergic rhinitis due to pollen: Secondary | ICD-10-CM

## 2023-01-05 MED ORDER — OMEPRAZOLE 40 MG PO CPDR: 40.0000 mg | DELAYED_RELEASE_CAPSULE | ORAL | 1 refills | Status: DC

## 2023-01-05 MED ORDER — LEVOCETIRIZINE DIHYDROCHLORIDE 5 MG PO TABS
5.0000 mg | ORAL_TABLET | Freq: Every day | ORAL | 1 refills | Status: DC | PRN
Start: 2023-01-05 — End: 2023-04-14

## 2023-01-05 MED ORDER — FLUTICASONE PROPIONATE 50 MCG/ACT NA SUSP: 1.0000 | Freq: Every morning | NASAL | 1 refills | Status: DC

## 2023-01-05 MED ORDER — FAMOTIDINE 40 MG PO TABS: 40.0000 mg | ORAL_TABLET | Freq: Every evening | ORAL | 1 refills | Status: DC

## 2023-01-05 NOTE — Patient Instructions (Addendum)
  1. Allergen avoidance measures - grass pollen, dust mite, mold  2. Treat and prevent inflammation of airway:   A. Flonase - 2 sprays each nostril 1 time per day  3. Treat and prevent LPR:   A. Minimize any caffeine or chocolate consumption  B. Omeprazole 40 mg -1 tablet in AM  C. Famotidine 40 mg -1 tablet in PM  D. Replace throat clear with swallowing / drinking maneuver   4. If needed:   A. OTC antihistamine  B. OTC nasal saline  5. Stop montelukast as this has not helped anything  6. Continue on immunotherapy (for now)  7. Return to clinic in December 2024 or earlier if problem

## 2023-01-05 NOTE — Progress Notes (Unsigned)
[place] [facility] [place] [facility] [place] [facility] [place] [facility] [place]   Follow-up Note  Referring Provider: Christel Mormon, MD Primary Provider: Christel Mormon, MD Date of Office Visit: 01/05/2023  Subjective:   Sherri [name] (DOB: 10/13/1985) is a 37 y.o. female who [facility] in re-evaluation of the following:  HPI: [name] presents to this clinic in evaluation of allergic rhinitis.  I have never seen her in this clinic and her last visit with Dr. Delorse Lek was 26 March 2022.  She is using immunotherapy directed against grass pollen, dust mite, mold,  but this is just not helping her very much.  While still continuing to use Flonase and montelukast and an antihistamine she still has a lot of throat clearing and itchiness in her throat and mucus in her throat and coughing to clear out her throat.  She does have some nasal congestion and sneezing on occasion but she has no anosmia and she has no headaches.  She definitely has mid chest heartburn after eating usually with most meals.  She does not consume any caffeine but does have chocolate intermittently.  Allergies as of 01/05/2023       Reactions   Macrobid [nitrofurantoin] Nausea And Vomiting   Augmentin [amoxicillin-pot Clavulanate]    Pt states it makes her sick   Cefdinir Itching   Metformin And Related Nausea And Vomiting        Medication List    amitriptyline 25 MG tablet Commonly known as: ELAVIL Take 2 tablets (50 mg total) by mouth at bedtime.   amitriptyline 50 MG tablet Commonly known as: ELAVIL Take 50 mg by mouth at bedtime.   amitriptyline 75 MG tablet Commonly known as: ELAVIL Take by mouth.   amLODipine 5 MG tablet Commonly known as: NORVASC Take 1 tablet by mouth daily.   baclofen 10 MG tablet Commonly known as: LIORESAL Take 1 tablet (10 mg total) by mouth 2 (two) times daily as needed for muscle spasms.   benzonatate 100 MG capsule Commonly known  as: TESSALON Take 1 capsule (100 mg total) by mouth every 8 (eight) hours.   Blood Pressure Kit Kit Use to check blood pressure twice daily and records BP logs nd bring to your office visits   cetirizine 10 MG tablet Commonly known as: ZYRTEC Take 1 tablet (10 mg total) by mouth daily.   clindamycin 1 % lotion Commonly known as: CLEOCIN T Apply to cleansed face daily in the morning.   EPINEPHrine 0.3 mg/0.3 mL Soaj injection Commonly known as: EpiPen 2-Pak Inject 0.3 mg into the muscle as needed for anaphylaxis.   famotidine 40 MG tablet Commonly known as: PEPCID Take 1 tablet (40 mg total) by mouth at bedtime.   fexofenadine 180 MG tablet Commonly known as: ALLEGRA Take 1 tablet (180 mg total) by mouth daily.   fluticasone 50 MCG/ACT nasal spray Commonly known as: FLONASE Place 1 spray into both nostrils daily. Begin by using 2 sprays in each nare daily for 3 to 5 days, then decrease to 1 spray in each nare daily.   fosfomycin 3 g Pack Commonly known as: MONUROL SMARTSIG:3 Gram(s) By Mouth Once   gemfibrozil 600 MG tablet Commonly known as: LOPID Take by mouth.   Guaifenesin 1200 MG Tb12 Take 1 tablet (1,200 mg total) by mouth in the morning and at bedtime.   hydrOXYzine 25 MG tablet Commonly known as: ATARAX TAKE 1 TABLET BY MOUTH THREE TIMES A  DAY AS NEEDED FOR ANXIETY FOR UP TO 90 DAYS   ipratropium 0.06 % nasal spray Commonly known as: ATROVENT Place 2 sprays into both nostrils 3 (three) times daily. As needed for nasal congestion, runny nose   Iron 28 MG Tabs Take 1 tablet by mouth daily.   meloxicam 15 MG tablet Commonly known as: Mobic Take 1 tablet (15 mg total) by mouth daily.   methocarbamol 500 MG tablet Commonly known as: ROBAXIN Take 1 tablet (500 mg total) by mouth 2 (two) times daily.   montelukast 10 MG tablet Commonly known as: Singulair Take 1 tablet (10 mg total) by mouth at bedtime.   Na Sulfate-K Sulfate-Mg Sulf 17.5-3.13-1.6  GM/177ML Soln See admin instructions.   naproxen 375 MG tablet Commonly known as: NAPROSYN Take 1 tablet (375 mg total) by mouth 2 (two) times daily as needed.   ondansetron 4 MG disintegrating tablet Commonly known as: ZOFRAN-ODT Take by mouth.   ondansetron 4 MG disintegrating tablet Commonly known as: ZOFRAN-ODT DISSOLVE TAKE 1 TABLET BY MOUTH EVERY 8 HOURS AS NEEDED FOR NAUSEA AND VOMITING   ondansetron 4 MG tablet Commonly known as: ZOFRAN PLEASE SEE ATTACHED FOR DETAILED DIRECTIONS   pantoprazole 40 MG tablet Commonly known as: PROTONIX Take 1 tablet (40 mg total) by mouth daily.   polyethylene glycol powder 17 GM/SCOOP powder Commonly known as: GLYCOLAX/MIRALAX Take 17 g by mouth daily as needed for mild constipation.   promethazine-dextromethorphan 6.25-15 MG/5ML syrup Commonly known as: PROMETHAZINE-DM Take 5 mLs by mouth 4 (four) times daily as needed for cough.   pseudoephedrine 60 MG tablet Commonly known as: SUDAFED Take 1 tablet (60 mg total) by mouth every 8 (eight) hours as needed for congestion.   SUMAtriptan 100 MG tablet Commonly known as: IMITREX Take 1 tablet at earliest onset of migraine.  May repeat in 2 hours if needed.  Maximum 2 tablets in 24 hours.   topiramate 25 MG tablet Commonly known as: TOPAMAX Take 1 tablet (25 mg total) by mouth at bedtime.   tretinoin 0.025 % cream Commonly known as: RETIN-A Apply pea size amount to cleansed face every other day at bedtime. Increase to every night as tolerated.   triamcinolone cream 0.1 % Commonly known as: KENALOG Apply to affected areas twice daily for 3 days on and 3 days off cycle or as needed.   triamcinolone ointment 0.5 % Commonly known as: KENALOG 1 application 2 times daily as needed to itchy/ inflamed areas from allergy injections.   Vitamin D (Ergocalciferol) 1.25 MG (50000 UNIT) Caps capsule Commonly known as: DRISDOL Take by mouth.   Vitamin D3 125 MCG (5000 UT) Caps Take 1  capsule by mouth daily.    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   HENT: Negative.    Eyes: Negative.   Respiratory:  Negative.    Cardiovascular: Negative.   Gastrointestinal: Negative.   Genitourinary: Negative.   Musculoskeletal: Negative.   Skin: Negative.   Neurological: Negative.   Endo/Heme/Allergies: Negative.   Psychiatric/Behavioral: Negative.       Objective:   Vitals:   01/05/23 1105 01/05/23 1135  BP: (!) 140/100 138/64  Pulse: 76   Temp: 98.5 F (36.9 C)   SpO2: 97%    Height: 4\' 11"  (149.9 cm)  Weight: 194 lb 9.6 oz (88.3 kg)   Physical Exam Constitutional:      Appearance: She is not diaphoretic.     Comments: Incessant throat clearing  HENT:     Head: Normocephalic.     Right Ear: Tympanic membrane, ear canal and external ear normal.     Left Ear: Tympanic membrane, ear canal and external ear normal.     Nose: Nose normal. No mucosal edema or rhinorrhea.     Mouth/Throat:     Pharynx: Uvula midline. No oropharyngeal exudate.  Eyes:     Conjunctiva/sclera: Conjunctivae normal.  Neck:     Thyroid: No thyromegaly.     Trachea: Trachea normal. No tracheal tenderness or tracheal deviation.  Cardiovascular:     Rate and Rhythm: Normal rate and regular rhythm.     Heart sounds: Normal heart sounds, S1 normal and S2 normal. No murmur heard. Pulmonary:      Effort: No respiratory distress.     Breath sounds: Normal breath sounds. No stridor. No wheezing or rales.  Lymphadenopathy:     Head:     Right side of head: No tonsillar adenopathy.     Left side of head: No tonsillar adenopathy.     Cervical: No cervical adenopathy.  Skin:    Findings: No erythema or rash.     Nails: There is no clubbing.  Neurological:     Mental Status: She is alert.     Diagnostics: none  Assessment and Plan:   1. LPRD (laryngopharyngeal reflux disease)   2. Perennial allergic rhinitis   3. Seasonal allergic rhinitis due to pollen    1. Allergen avoidance measures - grass pollen, dust mite, mold  2. Treat and prevent inflammation of airway:   A. Flonase - 2 sprays each nostril 1 time per day  3. Treat and prevent LPR:   A. Minimize any caffeine or chocolate consumption  B. Omeprazole 40 mg -1 tablet in AM  C. Famotidine 40 mg -1 tablet in PM  D. Replace throat clear with swallowing / drinking maneuver   4. If needed:   A. OTC antihistamine  B. OTC nasal saline  5. Stop montelukast as this has not helped anything  6. Continue on immunotherapy (for now)  7. Return to clinic in December 2024 or earlier if problem  In addition to atopic disease it appears that Carma has another insult to her respiratory tract most likely secondary to her reflux disease which appears to be quite active.  I am going to have her treat LPR with the plan noted above while she maintains therapy directed against her atopic disease with immunotherapy and Flonase.  Montelukast has not helped her at all so she can discontinue this agent.  I will see her back in this clinic in 4 to 6 weeks.  Laurette Schimke, MD Allergy / Immunology [facility]

## 2023-01-07 ENCOUNTER — Encounter: Payer: Self-pay | Admitting: Allergy and Immunology

## 2023-01-14 ENCOUNTER — Ambulatory Visit: Payer: Medicaid Other | Admitting: Obstetrics and Gynecology

## 2023-01-21 ENCOUNTER — Ambulatory Visit: Payer: Medicaid Other | Admitting: Podiatry

## 2023-02-03 ENCOUNTER — Ambulatory Visit (INDEPENDENT_AMBULATORY_CARE_PROVIDER_SITE_OTHER): Payer: Self-pay

## 2023-02-03 DIAGNOSIS — J309 Allergic rhinitis, unspecified: Secondary | ICD-10-CM | POA: Diagnosis not present

## 2023-02-09 ENCOUNTER — Ambulatory Visit: Payer: Medicaid Other | Admitting: Allergy and Immunology

## 2023-02-16 ENCOUNTER — Ambulatory Visit: Payer: Medicaid Other | Admitting: Allergy and Immunology

## 2023-02-18 ENCOUNTER — Other Ambulatory Visit: Payer: Self-pay

## 2023-02-18 ENCOUNTER — Encounter: Payer: Self-pay | Admitting: Allergy

## 2023-02-18 ENCOUNTER — Ambulatory Visit: Payer: Medicaid Other | Admitting: Allergy

## 2023-02-18 VITALS — BP 126/78 | HR 83 | Temp 98.7°F | Resp 18 | Ht 59.0 in | Wt 195.3 lb

## 2023-02-18 DIAGNOSIS — J302 Other seasonal allergic rhinitis: Secondary | ICD-10-CM

## 2023-02-18 DIAGNOSIS — K219 Gastro-esophageal reflux disease without esophagitis: Secondary | ICD-10-CM

## 2023-02-18 DIAGNOSIS — J3089 Other allergic rhinitis: Secondary | ICD-10-CM

## 2023-02-18 MED ORDER — FAMOTIDINE 40 MG PO TABS: 40.0000 mg | ORAL_TABLET | Freq: Every evening | ORAL | 1 refills | Status: DC

## 2023-02-18 MED ORDER — AZELASTINE-FLUTICASONE 137-50 MCG/ACT NA SUSP: NASAL | 5 refills | Status: DC

## 2023-02-18 MED ORDER — OMEPRAZOLE 40 MG PO CPDR: 40.0000 mg | DELAYED_RELEASE_CAPSULE | ORAL | 1 refills | Status: DC

## 2023-02-18 MED ORDER — FEXOFENADINE HCL 180 MG PO TABS: 180.0000 mg | ORAL_TABLET | Freq: Every day | ORAL | 1 refills | Status: DC

## 2023-02-18 MED ORDER — MONTELUKAST SODIUM 10 MG PO TABS: 10.0000 mg | ORAL_TABLET | Freq: Every day | ORAL | 1 refills | Status: DC

## 2023-02-18 NOTE — Progress Notes (Signed)
Follow-up Note  RE: Sherri [name] MRN: [id number] DOB: [birth year] Date of Office Visit: 02/18/2023   History of present illness: Sherri [name] is a 37 y.o. female presenting today for follow-up of allergic rhinitis and LPRD.  She was last seen in the office on 01/05/2023 by Dr. Lucie Leather.  Discussed the use of AI scribe software for clinical note transcription with the patient, who gave verbal consent to proceed.  She reports persistent allergy symptoms despite the treatment with her allergy medications and immunotherapy. She describes experiencing increased coughing and nasal drainage, which she attributes to her allergies. The patient notes that while she believes the allergy shots are working, she continues to experience sneezing and coughing episodes. She has been managing these symptoms with a daily dose of Xyzal and Flonase nasal spray as needed. However, she reports that the Xyzal, which she has been taking for several months, does not seem to be as effective as it was initially. She also notes that she sometimes takes an extra dose of Xyzal, but does not observe a significant difference in her symptoms when she does so. The patient also reports taking omeprazole and famotidine for reflux control. She has not started any new medications recently.      Review of systems: 10pt ROS negative unless noted above in HPI   All other systems negative unless noted above in HPI  Past medical/social/surgical/family history have been reviewed and are unchanged unless specifically indicated below.  No changes  Medication List: Current Outpatient Medications  Medication Sig Dispense Refill   amitriptyline (ELAVIL) 50 MG tablet Take 50 mg by mouth at bedtime.     amLODipine (NORVASC) 5 MG tablet Take 1 tablet by mouth daily.     Azelastine-Fluticasone 137-50 MCG/ACT SUSP 1 spray each nostril twice a day as needed for nasal congestion or drainage control 23 g 5   baclofen (LIORESAL) 10 MG  tablet Take 1 tablet (10 mg total) by mouth 2 (two) times daily as needed for muscle spasms. 14 tablet 0   benzonatate (TESSALON) 100 MG capsule Take 1 capsule (100 mg total) by mouth every 8 (eight) hours. 21 capsule 0   Blood Pressure Monitoring (BLOOD PRESSURE KIT) KIT Use to check blood pressure twice daily and records BP logs nd bring to your office visits     cetirizine (ZYRTEC) 10 MG tablet Take 1 tablet (10 mg total) by mouth daily. 30 tablet 5   Cholecalciferol (VITAMIN D3) 125 MCG (5000 UT) CAPS Take 1 capsule by mouth daily.     clindamycin (CLEOCIN T) 1 % lotion Apply to cleansed face daily in the morning. 60 mL 1   EPINEPHrine (EPIPEN 2-PAK) 0.3 mg/0.3 mL IJ SOAJ injection Inject 0.3 mg into the muscle as needed for anaphylaxis. 2 each 1   Ferrous Sulfate (IRON) 28 MG TABS Take 1 tablet by mouth daily.     fluticasone (FLONASE) 50 MCG/ACT nasal spray Place 1 spray into both nostrils every morning. Begin by using 2 sprays in each nare daily for 3 to 5 days, then decrease to 1 spray in each nare daily. 54.6 mL 1   fosfomycin (MONUROL) 3 g PACK SMARTSIG:3 Gram(s) By Mouth Once     gemfibrozil (LOPID) 600 MG tablet Take by mouth.     Guaifenesin 1200 MG TB12 Take 1 tablet (1,200 mg total) by mouth in the morning and at bedtime. 14 tablet 0   hydrOXYzine (ATARAX) 25 MG tablet TAKE 1 TABLET BY MOUTH THREE  TIMES A DAY AS NEEDED FOR ANXIETY FOR UP TO 90 DAYS     ipratropium (ATROVENT) 0.06 % nasal spray Place 2 sprays into both nostrils 3 (three) times daily. As needed for nasal congestion, runny nose 15 mL 2   levocetirizine (XYZAL) 5 MG tablet Take 1 tablet (5 mg total) by mouth daily as needed for allergies (Can take an extra dose during flare ups.). 180 tablet 1   meloxicam (MOBIC) 15 MG tablet Take 1 tablet (15 mg total) by mouth daily. 30 tablet 3   methocarbamol (ROBAXIN) 500 MG tablet Take 1 tablet (500 mg total) by mouth 2 (two) times daily. 20 tablet 0   Na Sulfate-K Sulfate-Mg Sulf  17.5-3.13-1.6 GM/177ML SOLN See admin instructions.     naproxen (NAPROSYN) 375 MG tablet Take 1 tablet (375 mg total) by mouth 2 (two) times daily as needed. 20 tablet 0   ondansetron (ZOFRAN) 4 MG tablet PLEASE SEE ATTACHED FOR DETAILED DIRECTIONS     ondansetron (ZOFRAN-ODT) 4 MG disintegrating tablet Take by mouth.     ondansetron (ZOFRAN-ODT) 4 MG disintegrating tablet DISSOLVE TAKE 1 TABLET BY MOUTH EVERY 8 HOURS AS NEEDED FOR NAUSEA AND VOMITING 20 tablet 4   pantoprazole (PROTONIX) 40 MG tablet Take 1 tablet (40 mg total) by mouth daily. 30 tablet 6   polyethylene glycol powder (GLYCOLAX/MIRALAX) 17 GM/SCOOP powder Take 17 g by mouth daily as needed for mild constipation. 238 g 0   promethazine-dextromethorphan (PROMETHAZINE-DM) 6.25-15 MG/5ML syrup Take 5 mLs by mouth 4 (four) times daily as needed for cough. 118 mL 0   pseudoephedrine (SUDAFED) 60 MG tablet Take 1 tablet (60 mg total) by mouth every 8 (eight) hours as needed for congestion. 30 tablet 0   SUMAtriptan (IMITREX) 100 MG tablet Take 1 tablet at earliest onset of migraine.  May repeat in 2 hours if needed.  Maximum 2 tablets in 24 hours. 10 tablet 5   topiramate (TOPAMAX) 25 MG tablet Take 1 tablet (25 mg total) by mouth at bedtime. 30 tablet 5   tretinoin (RETIN-A) 0.025 % cream Apply pea size amount to cleansed face every other day at bedtime. Increase to every night as tolerated. 20 g 0   triamcinolone cream (KENALOG) 0.1 % Apply to affected areas twice daily for 3 days on and 3 days off cycle or as needed. 80 g 0   triamcinolone ointment (KENALOG) 0.5 % 1 application 2 times daily as needed to itchy/ inflamed areas from allergy injections. 30 g 1   Vitamin D, Ergocalciferol, (DRISDOL) 1.25 MG (50000 UNIT) CAPS capsule Take by mouth.     amitriptyline (ELAVIL) 25 MG tablet Take 2 tablets (50 mg total) by mouth at bedtime. 60 tablet 2   amitriptyline (ELAVIL) 75 MG tablet Take by mouth.     famotidine (PEPCID) 40 MG tablet  Take 1 tablet (40 mg total) by mouth at bedtime. 90 tablet 1   fexofenadine (ALLEGRA) 180 MG tablet Take 1 tablet (180 mg total) by mouth daily. 90 tablet 1   montelukast (SINGULAIR) 10 MG tablet Take 1 tablet (10 mg total) by mouth at bedtime. 90 tablet 1   omeprazole (PRILOSEC) 40 MG capsule Take 1 capsule (40 mg total) by mouth every morning. 90 capsule 1   No current facility-administered medications for this visit.     Known medication allergies: Allergies  Allergen Reactions   Macrobid [Nitrofurantoin] Nausea And Vomiting   Augmentin [Amoxicillin-Pot Clavulanate]     Pt states it makes  her sick   Cefdinir Itching   Metformin And Related Nausea And Vomiting     Physical examination: Blood pressure 126/78, pulse 83, temperature 98.7 F (37.1 C), resp. rate 18, height 4\' 11"  (1.499 m), weight 195 lb 4.8 oz (88.6 kg), SpO2 97%.  General: Alert, interactive, in no acute distress. HEENT: PERRLA, TMs pearly gray, turbinates mildly edematous with clear discharge, post-pharynx non erythematous. Neck: Supple without lymphadenopathy. Lungs: Clear to auscultation without wheezing, rhonchi or rales. {no increased work of breathing. CV: Normal S1, S2 without murmurs. Abdomen: Nondistended, nontender. Skin: Warm and dry, without lesions or rashes. Extremities:  No clubbing, cyanosis or edema. Neuro:   Grossly intact.  Diagnositics/Labs: None today  Assessment and plan: Allergic rhinitis LPRD  1. Allergen avoidance measures - grass pollen, dust mite, mold  2. Treat and prevent inflammation of airway:   A. Dymista 1 spray each nostril twice a day as needed for nasal congestion or drainage control.   With using nasal sprays point tip of bottle toward eye on same side nostril and lean head slightly forward for best technique.    B. Change Xyzal to Allegra 180mg  daily  3. Treat and prevent LPR:   A. Minimize any caffeine or chocolate consumption  B. Omeprazole 40 mg -1 tablet in  AM  C. Famotidine 40 mg -1 tablet in PM  D. Replace throat clear with swallowing / drinking maneuver   4. Continue on allergy shots (for now)  5. Return to clinic in 4-6 months or sooner if needed  I appreciate the opportunity to take part in Simar's care. Please do not hesitate to contact me with questions.  Sincerely,   Margo Aye, MD Allergy/Immunology [facility]

## 2023-02-18 NOTE — Patient Instructions (Addendum)
  1. Allergen avoidance measures - grass pollen, dust mite, mold  2. Treat and prevent inflammation of airway:   A. Dymista 1 spray each nostril twice a day as needed for nasal congestion or drainage control.   With using nasal sprays point tip of bottle toward eye on same side nostril and lean head slightly forward for best technique.    B. Change Xyzal to Allegra 180mg  daily  3. Treat and prevent LPR:   A. Minimize any caffeine or chocolate consumption  B. Omeprazole 40 mg -1 tablet in AM  C. Famotidine 40 mg -1 tablet in PM  D. Replace throat clear with swallowing / drinking maneuver   4. Continue on allergy shots (for now)  5. Return to clinic in 4-6 months or sooner if needed

## 2023-02-19 ENCOUNTER — Telehealth: Payer: Self-pay | Admitting: Allergy

## 2023-02-19 NOTE — Telephone Encounter (Signed)
Patient called and stated that her pharmacy told her to call her and let us know that her nasal spray is requiring a prior auth. Patient is requesting a call back when this is done as she needs this medication. Patients call back number is [phone]

## 2023-02-19 NOTE — Telephone Encounter (Signed)
Can we please submit a prior authorization for azelastine-fluticasone?

## 2023-02-20 ENCOUNTER — Emergency Department (HOSPITAL_COMMUNITY)
Admission: EM | Admit: 2023-02-20 | Discharge: 2023-02-20 | Disposition: A | Discharge status: Home / Self Care | Payer: Medicaid Other | Attending: Emergency Medicine | Admitting: Emergency Medicine

## 2023-02-20 ENCOUNTER — Emergency Department (HOSPITAL_COMMUNITY): Payer: Medicaid Other

## 2023-02-20 DIAGNOSIS — M79672 Pain in left foot: Secondary | ICD-10-CM | POA: Diagnosis present

## 2023-02-20 DIAGNOSIS — M722 Plantar fascial fibromatosis: Secondary | ICD-10-CM | POA: Diagnosis not present

## 2023-02-20 MED ORDER — KETOROLAC TROMETHAMINE 15 MG/ML IJ SOLN
15.0000 mg | Freq: Once | INTRAMUSCULAR | Status: AC
  Administered 2023-02-20: 15 mg via INTRAMUSCULAR
  Filled 2023-02-20: qty 1

## 2023-02-20 NOTE — Discharge Instructions (Signed)
As we discussed, your workup in the ER today was reassuring for acute findings.  X-ray imaging of your foot did not reveal any fracture or dislocation.  I suspect your symptoms are due to the plantar fasciitis you have been diagnosed with previously.  I have given you a brace to wear for support and recommend that you rest, ice, compress, and elevate your foot and take Tylenol/ibuprofen as needed for pain.  There are more specialized bracing options for this diagnosis which you can get over-the-counter or online.  Once your pain has improved, I recommend some passive stretching exercises which you can try at home.  Please call your orthopedist to try to facilitate close follow-up.  Return if development of any new or worsening symptoms.

## 2023-02-20 NOTE — ED Triage Notes (Signed)
Patient reports left foot pain x 1 week Rated 9/10 No injury

## 2023-02-20 NOTE — ED Provider Notes (Signed)
[place] [facility]PARTMENT AT [facility] Provider Note   CSN: [id number] Arrival date & time: 02/20/23  4098     History  Chief Complaint  Patient presents with   Foot Pain    Sherri [name] is a 37 y.o. female.  Patient with history of plantar fascitis present today with complaints of left foot pain. States that same has been ongoing x 1 week and has been persistent.  Was seen for similar symptoms in September and was diagnosed with plantar fasciitis.  States this feels similar.  She has not tried any bracing or stretching exercises.  She has not tried any NSAIDs either.  States that her pain is worse in the morning but is persistent throughout the day as well.  She is able to walk with some discomfort.  Denies fevers or chills.  She has an appointment for follow-up with her orthopedist in January.   The history is provided by the patient. No language interpreter was used.  Foot Pain       Home Medications Prior to Admission medications   Medication Sig Start Date End Date Taking? Authorizing Provider  amitriptyline (ELAVIL) 25 MG tablet Take 2 tablets (50 mg total) by mouth at bedtime. 06/24/21 08/07/22  Mayers, Cari S, PA-C  amitriptyline (ELAVIL) 50 MG tablet Take 50 mg by mouth at bedtime. 06/19/22   [provider]  amitriptyline (ELAVIL) 75 MG tablet Take by mouth. 08/28/22 11/26/22  [provider]  amLODipine (NORVASC) 5 MG tablet Take 1 tablet by mouth daily. 08/11/22   [provider]  Azelastine-Fluticasone 137-50 MCG/ACT SUSP 1 spray each nostril twice a day as needed for nasal congestion or drainage control 02/18/23   Marcelyn Bruins, MD  baclofen (LIORESAL) 10 MG tablet Take 1 tablet (10 mg total) by mouth 2 (two) times daily as needed for muscle spasms. 11/29/22   Raspet, Noberto Retort, PA-C  benzonatate (TESSALON) 100 MG capsule Take 1 capsule (100 mg total) by mouth every 8 (eight) hours. 10/17/22   Carlisle Beers,  FNP  Blood Pressure Monitoring (BLOOD PRESSURE KIT) KIT Use to check blood pressure twice daily and records BP logs nd bring to your office visits 08/03/22   [provider]  cetirizine (ZYRTEC) 10 MG tablet Take 1 tablet (10 mg total) by mouth daily. 07/01/22   Marcelyn Bruins, MD  Cholecalciferol (VITAMIN D3) 125 MCG (5000 UT) CAPS Take 1 capsule by mouth daily.    [provider]  clindamycin (CLEOCIN T) 1 % lotion Apply to cleansed face daily in the morning. 06/30/21     EPINEPHrine (EPIPEN 2-PAK) 0.3 mg/0.3 mL IJ SOAJ injection Inject 0.3 mg into the muscle as needed for anaphylaxis. 12/31/22   Marcelyn Bruins, MD  famotidine (PEPCID) 40 MG tablet Take 1 tablet (40 mg total) by mouth at bedtime. 02/18/23   Marcelyn Bruins, MD  Ferrous Sulfate (IRON) 28 MG TABS Take 1 tablet by mouth daily.    [provider]  fexofenadine (ALLEGRA) 180 MG tablet Take 1 tablet (180 mg total) by mouth daily. 02/18/23   Marcelyn Bruins, MD  fluticasone (FLONASE) 50 MCG/ACT nasal spray Place 1 spray into both nostrils every morning. Begin by using 2 sprays in each nare daily for 3 to 5 days, then decrease to 1 spray in each nare daily. 01/05/23   Kozlow, Alvira Philips, MD  fosfomycin (MONUROL) 3 g PACK SMARTSIG:3 Gram(s) By Mouth Once 06/16/22   [provider]  gemfibrozil (LOPID) 600 MG tablet Take by mouth. 06/01/22   [provider]  Guaifenesin 1200 MG TB12 Take 1 tablet (1,200 mg total) by mouth in the morning and at bedtime. 10/17/22   Carlisle Beers, FNP  hydrOXYzine (ATARAX) 25 MG tablet TAKE 1 TABLET BY MOUTH THREE TIMES A DAY AS NEEDED FOR ANXIETY FOR UP TO 90 DAYS 08/11/22   [provider]  ipratropium (ATROVENT) 0.06 % nasal spray Place 2 sprays into both nostrils 3 (three) times daily. As needed for nasal congestion, runny nose 01/27/22   Theadora Rama Scales, PA-C  levocetirizine (XYZAL) 5 MG tablet Take 1 tablet (5 mg  total) by mouth daily as needed for allergies (Can take an extra dose during flare ups.). 01/05/23   Kozlow, Alvira Philips, MD  meloxicam (MOBIC) 15 MG tablet Take 1 tablet (15 mg total) by mouth daily. 09/22/22   McDonald, Rachelle Hora, DPM  methocarbamol (ROBAXIN) 500 MG tablet Take 1 tablet (500 mg total) by mouth 2 (two) times daily. 09/11/22   Bing Neighbors, NP  montelukast (SINGULAIR) 10 MG tablet Take 1 tablet (10 mg total) by mouth at bedtime. 02/18/23   Marcelyn Bruins, MD  Na Sulfate-K Sulfate-Mg Sulf 17.5-3.13-1.6 GM/177ML SOLN See admin instructions. 04/10/22   [provider]  naproxen (NAPROSYN) 375 MG tablet Take 1 tablet (375 mg total) by mouth 2 (two) times daily as needed. 09/11/22   Bing Neighbors, NP  omeprazole (PRILOSEC) 40 MG capsule Take 1 capsule (40 mg total) by mouth every morning. 02/18/23   Marcelyn Bruins, MD  ondansetron (ZOFRAN) 4 MG tablet PLEASE SEE ATTACHED FOR DETAILED DIRECTIONS 07/08/22   [provider]  ondansetron (ZOFRAN-ODT) 4 MG disintegrating tablet Take by mouth. 08/27/22   [provider]  ondansetron (ZOFRAN-ODT) 4 MG disintegrating tablet DISSOLVE TAKE 1 TABLET BY MOUTH EVERY 8 HOURS AS NEEDED FOR NAUSEA AND VOMITING 12/22/22   Everlena Cooper, Adam R, DO  pantoprazole (PROTONIX) 40 MG tablet Take 1 tablet (40 mg total) by mouth daily. 11/17/21   Meredith Pel, NP  polyethylene glycol powder (GLYCOLAX/MIRALAX) 17 GM/SCOOP powder Take 17 g by mouth daily as needed for mild constipation. 09/27/21   Renne Crigler, PA-C  promethazine-dextromethorphan (PROMETHAZINE-DM) 6.25-15 MG/5ML syrup Take 5 mLs by mouth 4 (four) times daily as needed for cough. 12/29/22   Mardella Layman, MD  pseudoephedrine (SUDAFED) 60 MG tablet Take 1 tablet (60 mg total) by mouth every 8 (eight) hours as needed for congestion. 03/08/22   Wallis Bamberg, PA-C  SUMAtriptan (IMITREX) 100 MG tablet Take 1 tablet at earliest onset of migraine.  May repeat in 2 hours  if needed.  Maximum 2 tablets in 24 hours. 04/22/22   Drema Dallas, DO  topiramate (TOPAMAX) 25 MG tablet Take 1 tablet (25 mg total) by mouth at bedtime. 04/22/22   Drema Dallas, DO  tretinoin (RETIN-A) 0.025 % cream Apply pea size amount to cleansed face every other day at bedtime. Increase to every night as tolerated. 06/30/21     triamcinolone cream (KENALOG) 0.1 % Apply to affected areas twice daily for 3 days on and 3 days off cycle or as needed. 06/30/21     triamcinolone ointment (KENALOG) 0.5 % 1 application 2 times daily as needed to itchy/ inflamed areas from allergy injections. 08/11/22   Marcelyn Bruins, MD  Vitamin D, Ergocalciferol, (DRISDOL) 1.25 MG (50000 UNIT) CAPS capsule Take by mouth. 07/07/22   [provider]      Allergies    Macrobid [nitrofurantoin], Augmentin [amoxicillin-pot clavulanate], Cefdinir, and Metformin and related    Review of Systems   Review of Systems  Musculoskeletal:  Positive for arthralgias.  All other systems reviewed and are negative.   Physical Exam Updated Vital Signs BP (!) 147/96   Pulse 77   Temp 98.8 F (37.1 C) (Oral)   Resp 13   Ht 4\' 11"  (1.499 m)   Wt 88.6 kg   LMP 02/10/2023   SpO2 100%   BMI 39.45 kg/m  Physical Exam Vitals and nursing note reviewed.  Constitutional:      General: She is not in acute distress.    Appearance: Normal appearance. She is normal weight. She is not ill-appearing, toxic-appearing or diaphoretic.  HENT:     Head: Normocephalic and atraumatic.  Cardiovascular:     Rate and Rhythm: Normal rate.  Pulmonary:     Effort: Pulmonary effort is normal. No respiratory distress.  Musculoskeletal:        General: Normal range of motion.     Cervical back: Normal range of motion.     Comments: TTP noted to the base of the left heel. No erythema or warmth.  No obvious deformity.  ROM intact with some discomfort.  DP and PT pulses intact 2+.  Compartments soft.  Capillary refill less than 2  seconds.  Observed to be ambulatory with steady gait.  Skin:    General: Skin is warm and dry.  Neurological:     General: No focal deficit present.     Mental Status: She is alert.  Psychiatric:        Mood and Affect: Mood normal.        Behavior: Behavior normal.     ED Results / Procedures / Treatments   Labs (all labs ordered are listed, but only abnormal results are displayed) Labs Reviewed - No data to display  EKG None  Radiology DG Foot Complete Left Result Date: 02/20/2023 CLINICAL DATA:  Left foot pain for 1 week. EXAM: LEFT FOOT - COMPLETE 3+ VIEW COMPARISON:  Foot radiographs dated 09/22/2022 FINDINGS: There is no evidence of fracture or dislocation. A plantar calcaneal enthesophyte is noted. Soft tissues are unremarkable. IMPRESSION: No acute osseous injury. Electronically Signed   By: Romona Curls M.D.   On: 02/20/2023 12:28    Procedures Procedures    Medications Ordered in ED Medications  ketorolac (TORADOL) 15 MG/ML injection 15 mg (has no administration in time range)    ED Course/ Medical Decision Making/ A&P                                 Medical Decision Making Amount and/or Complexity of Data Reviewed Radiology: ordered.  Risk Prescription drug management.   Patient presents today with complaints of left foot pain x 1 week.  She is afebrile, nontoxic-appearing, and in no acute distress with reassuring vital signs.  Physical exam reveals TTP along the base of the left heel with no erythema or warmth.  No deformity.  ROM intact.  X-ray imaging obtained of the left foot which has resulted and reveals no acute findings.  I personally reviewed and interpreted this imaging and agree with radiology interpretation.  Patient's symptoms likely due to the plantar fasciitis that she was diagnosed with previously.  Given Toradol with improvement.  Also given a lace up brace for support and  recommendations for more specialized bracing which she can get over  the counter. Recommend RICE and passive stretching as well. Discussed importance of close orthopedic follow-up. Evaluation and diagnostic testing in the emergency department does not suggest an emergent condition requiring admission or immediate intervention beyond what has been performed at this time.  Plan for discharge with close PCP follow-up.  Patient is understanding and amenable with plan, educated on red flag symptoms that would prompt immediate return.  Patient discharged in stable condition.  Final Clinical Impression(s) / ED Diagnoses Final diagnoses:  Plantar fasciitis of left foot    Rx / DC Orders ED Discharge Orders     None     An After Visit Summary was printed and given to the patient.     Vear Clock 02/20/23 1307    Bethann Berkshire, MD 02/22/23 [phone]

## 2023-02-22 ENCOUNTER — Telehealth: Payer: Self-pay

## 2023-02-22 ENCOUNTER — Other Ambulatory Visit: Payer: Self-pay | Admitting: *Deleted

## 2023-02-22 ENCOUNTER — Other Ambulatory Visit (HOSPITAL_COMMUNITY): Payer: Self-pay

## 2023-02-22 MED ORDER — DYMISTA 137-50 MCG/ACT NA SUSP: NASAL | 5 refills | Status: DC

## 2023-02-22 NOTE — Telephone Encounter (Signed)
Pharmacy Patient Advocate Encounter   Received notification from Pt Calls Messages that prior authorization for Azelastine-Fluticasone 137-50 MCG/ACT is required/requested.   Insurance verification completed.   The patient is insured through [facility] .   Per test claim: The current 30 day co-pay is, $4.00.  No PA needed at this time. This test claim was processed through [facility]- copay amounts may vary at other pharmacies due to pharmacy/plan contracts, or as the patient moves through the different stages of their insurance plan.     *Please note: Must be run as [employer]

## 2023-02-22 NOTE — Telephone Encounter (Signed)
Brand Dymista is covered for $4.00

## 2023-02-22 NOTE — Telephone Encounter (Signed)
Prescription for brand name Dymista has been sent in. Called and informed the patient, patient verbalized understanding.

## 2023-03-04 ENCOUNTER — Ambulatory Visit (INDEPENDENT_AMBULATORY_CARE_PROVIDER_SITE_OTHER): Payer: Medicaid Other | Admitting: Podiatry

## 2023-03-04 ENCOUNTER — Ambulatory Visit (INDEPENDENT_AMBULATORY_CARE_PROVIDER_SITE_OTHER): Payer: Medicaid Other | Admitting: *Deleted

## 2023-03-04 ENCOUNTER — Encounter: Payer: Self-pay | Admitting: Podiatry

## 2023-03-04 VITALS — Ht 59.0 in | Wt 195.0 lb

## 2023-03-04 DIAGNOSIS — J309 Allergic rhinitis, unspecified: Secondary | ICD-10-CM | POA: Diagnosis not present

## 2023-03-04 DIAGNOSIS — M722 Plantar fascial fibromatosis: Secondary | ICD-10-CM

## 2023-03-04 MED ORDER — METHYLPREDNISOLONE 4 MG PO TBPK: ORAL_TABLET | ORAL | 0 refills | Status: DC

## 2023-03-05 NOTE — Progress Notes (Signed)
 Subjective:  Patient ID: Sherri  [name], female    DOB: Aug 07, 1985,  MRN: [id number]  Chief Complaint  Patient presents with   Plantar Fasciitis    Patient is here for bilateral plantar fasciitis, left hurting more than right , tried braces and injections, still hurting    38 y.o. female presents with the above complaint. History confirmed with patient. Still very painful. She completed the right foot MRI. The left MRI was denied by her insurance. She would like to proceed with surgery.   Objective:  Physical Exam: warm, good capillary refill, no trophic changes or ulcerative lesions, normal DP and PT pulses, and normal sensory exam. Left Foot: point tenderness over the heel pad and gastrocnemius equinus is noted with a positive silverskiold test Right Foot: point tenderness over the heel pad and gastrocnemius equinus is noted with a positive silverskiold test  No images are attached to the encounter.  Radiographs: Multiple views x-ray of both feet: no fracture, dislocation, swelling or degenerative changes noted and plantar calcaneal spur   Study Result  Narrative & Impression  CLINICAL DATA:  Chronic right heel pain.   EXAM: MR OF THE RIGHT HEEL WITHOUT CONTRAST   TECHNIQUE: Multiplanar, multisequence MR imaging of the right ankle was performed. No intravenous contrast was administered.   COMPARISON:  Right foot x-rays dated September 22, 2022.   FINDINGS: TENDONS   Peroneal: Peroneal longus tendon intact. Peroneal brevis intact.   Posteromedial: Posterior tibial tendon intact. Flexor digitorum longus tendon intact. Flexor hallucis longus tendon intact.   Anterior: Tibialis anterior tendon intact. Extensor hallucis longus tendon intact. Extensor digitorum longus tendon intact.   Achilles:  Intact.   Plantar Fascia: Intact. Slight thickening of the proximal central band with mild surrounding soft tissue swelling.   LIGAMENTS   Lateral: Anterior talofibular  ligament is chronically torn. Calcaneofibular ligament intact. Posterior talofibular ligament intact. Anterior and posterior tibiofibular ligaments intact.   Medial: Deltoid ligament intact. Spring ligament intact.   CARTILAGE   Ankle Joint: No joint effusion. Normal ankle mortise. No chondral defect.   Subtalar Joints/Sinus Tarsi: Normal subtalar joints. No subtalar joint effusion. Normal sinus tarsi.   Bones: No marrow signal abnormality.  No fracture or dislocation.   Soft Tissue: No soft tissue mass or fluid collection.   IMPRESSION: 1. Mild plantar fasciitis.  No tear. 2. Chronic anterior talofibular ligament tear.     Electronically Signed   By: Elsie ONEIDA Shoulder M.D.   On: 12/25/2022 15:42   Assessment:   No diagnosis found.    Plan:  Patient was evaluated and treated and all questions answered.  We reviewed the results of her MRI and discussed further treatment we discussed the surgical treatment options of plantar fascial release and gastrocnemius recession. She has failed all non surgical treatment thus far. We discussed the risks and benefits and potential complications of surgery including but not limited to pain, swelling, infection, scar, numbness which may be temporary or permanent, chronic pain, stiffness, nerve pain or damage, wound healing problems. She understands and wishes to proceed. Informed consent signed and reviewed. MRI for the right heel has been re-ordered and is medically necessary to determine a surgical plan for that side which will happen 8 weeks following her right foot surgery. Methylprednisolone  taper sent to pharmacy for anti inflammatory relief   Surgical plan:  Procedure: -right EPF and EGR  Location: -[facility]  Anesthesia plan: -sedation w/ block  Postoperative pain plan: - Tylenol  1000 mg every 6 hours, ibuprofen   600 mg every 6 hours, gabapentin  300 mg every 8 hours x5 days, oxycodone  5 mg 1-2 tabs every 6 hours only as  needed  DVT prophylaxis: -none required  WB Restrictions / DME needs: -WBAT in CAM boot post op    No follow-ups on file.

## 2023-03-08 ENCOUNTER — Ambulatory Visit (INDEPENDENT_AMBULATORY_CARE_PROVIDER_SITE_OTHER): Payer: Medicaid Other | Admitting: *Deleted

## 2023-03-08 DIAGNOSIS — J309 Allergic rhinitis, unspecified: Secondary | ICD-10-CM | POA: Diagnosis not present

## 2023-03-11 ENCOUNTER — Ambulatory Visit
Admission: EM | Admit: 2023-03-11 | Discharge: 2023-03-11 | Disposition: A | Discharge status: Home / Self Care | Payer: Medicaid Other | Attending: Physician Assistant | Admitting: Physician Assistant

## 2023-03-11 ENCOUNTER — Ambulatory Visit: Payer: Medicaid Other | Admitting: Podiatry

## 2023-03-11 ENCOUNTER — Ambulatory Visit: Payer: Medicaid Other | Admitting: Obstetrics and Gynecology

## 2023-03-11 ENCOUNTER — Encounter: Payer: Self-pay | Admitting: Obstetrics and Gynecology

## 2023-03-11 VITALS — BP 129/90 | HR 78 | Wt 198.7 lb

## 2023-03-11 DIAGNOSIS — N946 Dysmenorrhea, unspecified: Secondary | ICD-10-CM

## 2023-03-11 DIAGNOSIS — N926 Irregular menstruation, unspecified: Secondary | ICD-10-CM

## 2023-03-11 DIAGNOSIS — H9202 Otalgia, left ear: Secondary | ICD-10-CM | POA: Diagnosis not present

## 2023-03-11 MED ORDER — IBUPROFEN 600 MG PO TABS: 600.0000 mg | ORAL_TABLET | Freq: Four times a day (QID) | ORAL | 2 refills | Status: DC | PRN

## 2023-03-11 MED ORDER — DOXYCYCLINE HYCLATE 100 MG PO CAPS: 100.0000 mg | ORAL_CAPSULE | Freq: Two times a day (BID) | ORAL | 0 refills | Status: DC

## 2023-03-11 NOTE — ED Triage Notes (Signed)
"  My left ear is hurting the last few days" OTC meds not helping.

## 2023-03-11 NOTE — Addendum Note (Signed)
 Addended by: Warden Fillers on: 03/11/2023 02:33 PM   Modules accepted: Orders

## 2023-03-11 NOTE — Progress Notes (Signed)
 Pt presents for GYN visit. Pt reports menorrhagia with irregular cycles. Cycle last 5-7 days. Pt currently on day 3 of cycle. Pt has associated tiredness, abdominal soreness when on cycle. Pt says this problem has been going on for years.

## 2023-03-11 NOTE — Progress Notes (Addendum)
  CC: irregular menses Subjective:    Patient ID: Sherri [name]  A Derks, female    DOB: 1985-05-14, 38 y.o.   MRN: [id number]  HPI 38 yo G1P0102 c/s x 1, seen for discussion of irregular menses.  Pt c/o irregular menses with cycles q 2-3 weeks lasting 5-7 days.  Pt notes menarche at age 14-13.  Hgb is stable at 12.9  Pt notes positive dyspareunia.  Pt notes pain with menses which is usually sharp.  She has tried tylenol  and ibuprofen  which were ineffective.  Pt states she used OCP in the past which did not improve irregular cycles or improve dysmenorrhea.  Pt is prediabetic.   Review of Systems     Objective:   Physical Exam Constitutional:      Appearance: Normal appearance. She is obese.  Cardiovascular:     Rate and Rhythm: Normal rate and regular rhythm.     Heart sounds: Normal heart sounds.  Pulmonary:     Effort: Pulmonary effort is normal.     Breath sounds: Normal breath sounds.  Abdominal:     General: There is no distension.     Palpations: There is no mass.     Tenderness: There is abdominal tenderness.     Comments: Diffuse mild/moderate tenderness in all quadrants.    Genitourinary:    Comments: Light vaginal bleeding noted Ext genitalia WNL Diffuse pelvic pain Equivocal CMT Uterus appears to be normal in size, no adnexal masses detected Neurological:     Mental Status: She is alert.   Positive acanthosis nigricans noted on posterior neck Vitals:   03/11/23 1323  BP: (!) 129/90  Pulse: 78         Assessment & Plan:   1. Irregular menses (Primary)  - US  PELVIC COMPLETE WITH TRANSVAGINAL; Future - Thyroid  Panel With TSH - Follicle stimulating hormone - Prolactin  Check u/s for structural defects, check labs for any endocrine sources.  Pt is suspicious for PCOS or insulin resistance Previous A1c mildly elevated.  Did discuss 10% weight loss may improve irregular menses through decrease in insulin resistance.  - ibuprofen  (ADVIL ) 600 MG tablet; Take 1  tablet (600 mg total) by mouth every 6 (six) hours as needed for headache, mild pain (pain score 1-3), moderate pain (pain score 4-6) or cramping.  Dispense: 30 tablet; Refill: 2  2. Dysmenorrhea: check swab at follow up visit for GC/C If u/s negative consider trial of OCPs If OCP does not improve pain or menses, consider diagnostic laparoscopy to eval for endometriosis   F/u in 5-6 weeks in person  Jerilynn DELENA Buddle, MD Faculty Attending, [facility]

## 2023-03-11 NOTE — ED Provider Notes (Signed)
 [name]    CSN: [id number] Arrival date & time: 03/11/23  0912      History   Chief Complaint Chief Complaint  Patient presents with   Otalgia    HPI Sherri [name] is a 38 y.o. female.   Here today for evaluation of left ear pain that is been bothering for the last few days.  She reports that she has tried over-the-counter medication as well as Flonase  without resolution.  She has not had any fever.  She denies sore throat.  She does report some mild nasal congestion but is not cough.    The history is provided by the patient.  Otalgia Associated symptoms: congestion   Associated symptoms: no abdominal pain, no cough, no fever, no sore throat and no vomiting     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    Urinary tract infection    Uterine fibroid     Patient Active Problem List   Diagnosis Date Noted   Moderate episode of recurrent major depressive disorder (HCC) 07/16/2022   Pre-diabetes 07/23/2021   Anxiety and depression 06/24/2021   Fibromyalgia 06/24/2021   Chronic sinusitis 12/26/2020   Sinus pressure 12/26/2020   Prediabetes 07/05/2020   Gastroesophageal reflux disease 04/26/2020   Perianal rash 03/26/2020   Chronic nausea 03/26/2020   Chronic constipation 03/09/2019   Pelvic pain in female 03/09/2019   Hypertension    Genital herpes    Uterine fibroid 12/25/2013   Obesity 12/25/2013   Bacterial vaginitis 04/27/2012    Past Surgical History:  Procedure Laterality Date   CESAREAN SECTION N/A 06/05/2014   Procedure: CESAREAN SECTION;  Surgeon: Lang JINNY Peel, DO;  Location: [facility];  Service: Obstetrics;  Laterality: N/A;   WISDOM TOOTH EXTRACTION      OB History     Gravida  1   Para  1   Term      Preterm  1   AB      Living  2       SAB      IAB      Ectopic      Multiple  1   Live Births  2            Home Medications    Prior to Admission medications   Medication Sig Start Date End Date Taking? Authorizing Provider  doxycycline  (VIBRAMYCIN ) 100 MG capsule Take 1 capsule (100 mg total) by mouth 2 (two) times daily for 7 days. 03/11/23 03/18/23 Yes Billy Asberry FALCON, PA-C  amitriptyline  (ELAVIL ) 50 MG tablet Take 50 mg by mouth at bedtime. 06/19/22   [provider]  amLODipine  (NORVASC ) 5 MG tablet Take 1 tablet by mouth daily. 08/11/22   [provider]  Azelastine -Fluticasone  137-50 MCG/ACT SUSP 1 spray each nostril twice a day as needed for nasal congestion or drainage control 02/18/23   Jeneal Danita Macintosh, MD  baclofen  (LIORESAL ) 10 MG tablet Take 1 tablet (10 mg total) by mouth 2 (two) times daily as needed for muscle spasms. 11/29/22   Raspet, Rocky POUR, PA-C  benzonatate  (TESSALON ) 100 MG capsule Take 1 capsule (100 mg total) by mouth every 8 (eight) hours. 10/17/22   Enedelia Dorna HERO, FNP  Blood Pressure Monitoring (BLOOD PRESSURE KIT) KIT Use  to check blood pressure twice daily and records BP logs nd bring to your office visits 08/03/22   [provider]  cetirizine  (ZYRTEC ) 10 MG tablet Take 1 tablet (10 mg total) by mouth daily. 07/01/22   Jeneal Danita Macintosh, MD  Cholecalciferol (VITAMIN D3) 125 MCG (5000 UT) CAPS Take 1 capsule by mouth daily.    [provider]  clindamycin  (CLEOCIN  T) 1 % lotion Apply to cleansed face daily in the morning. 06/30/21     DYMISTA  137-50 MCG/ACT SUSP 1 spray each nostril twice a day as needed for nasal congestion or drainage control. 02/22/23   Jeneal Danita Macintosh, MD  EPINEPHrine  (EPIPEN  2-PAK) 0.3 mg/0.3 mL IJ SOAJ injection Inject 0.3 mg into the muscle as needed for anaphylaxis. 12/31/22   Jeneal Danita Macintosh, MD  famotidine  (PEPCID ) 40 MG tablet Take 1 tablet (40 mg total) by mouth at bedtime. 02/18/23    Jeneal Danita Macintosh, MD  Ferrous Sulfate (IRON) 28 MG TABS Take 1 tablet by mouth daily.    [provider]  fexofenadine  (ALLEGRA ) 180 MG tablet Take 1 tablet (180 mg total) by mouth daily. 02/18/23   Jeneal Danita Macintosh, MD  fluticasone  (FLONASE ) 50 MCG/ACT nasal spray Place 1 spray into both nostrils every morning. Begin by using 2 sprays in each nare daily for 3 to 5 days, then decrease to 1 spray in each nare daily. 01/05/23   Kozlow, Eric J, MD  fosfomycin (MONUROL ) 3 g PACK SMARTSIG:3 Gram(s) By Mouth Once 06/16/22   [provider]  gemfibrozil (LOPID) 600 MG tablet Take by mouth. 06/01/22   [provider]  Guaifenesin  1200 MG TB12 Take 1 tablet (1,200 mg total) by mouth in the morning and at bedtime. 10/17/22   Enedelia Dorna HERO, FNP  hydrOXYzine  (ATARAX ) 25 MG tablet TAKE 1 TABLET BY MOUTH THREE TIMES A DAY AS NEEDED FOR ANXIETY FOR UP TO 90 DAYS 08/11/22   [provider]  ipratropium (ATROVENT ) 0.06 % nasal spray Place 2 sprays into both nostrils 3 (three) times daily. As needed for nasal congestion, runny nose 01/27/22   Joesph Shaver Scales, PA-C  levocetirizine (XYZAL ) 5 MG tablet Take 1 tablet (5 mg total) by mouth daily as needed for allergies (Can take an extra dose during flare ups.). 01/05/23   Kozlow, Camellia PARAS, MD  meloxicam  (MOBIC ) 15 MG tablet Take 1 tablet (15 mg total) by mouth daily. 09/22/22   McDonald, Juliene SAUNDERS, DPM  methocarbamol  (ROBAXIN ) 500 MG tablet Take 1 tablet (500 mg total) by mouth 2 (two) times daily. 09/11/22   Arloa Suzen RAMAN, NP  methylPREDNISolone  (MEDROL  DOSEPAK) 4 MG TBPK tablet 6 day dose pack - take as directed 03/04/23   Silva Juliene SAUNDERS, DPM  montelukast  (SINGULAIR ) 10 MG tablet Take 1 tablet (10 mg total) by mouth at bedtime. 02/18/23   Jeneal Danita Macintosh, MD  Na Sulfate-K Sulfate-Mg Sulf 17.5-3.13-1.6 GM/177ML SOLN See admin instructions. 04/10/22   [provider]  naproxen  (NAPROSYN ) 375 MG  tablet Take 1 tablet (375 mg total) by mouth 2 (two) times daily as needed. 09/11/22   Arloa Suzen RAMAN, NP  omeprazole  (PRILOSEC) 40 MG capsule Take 1 capsule (40 mg total) by mouth every morning. 02/18/23   Jeneal Danita Macintosh, MD  ondansetron  (ZOFRAN ) 4 MG tablet PLEASE SEE ATTACHED FOR DETAILED DIRECTIONS 07/08/22   [provider]  ondansetron  (ZOFRAN -ODT) 4 MG disintegrating tablet Take by mouth. 08/27/22   [provider]  ondansetron  (ZOFRAN -ODT) 4  MG disintegrating tablet DISSOLVE TAKE 1 TABLET BY MOUTH EVERY 8 HOURS AS NEEDED FOR NAUSEA AND VOMITING 12/22/22   Skeet, Adam R, DO  pantoprazole  (PROTONIX ) 40 MG tablet Take 1 tablet (40 mg total) by mouth daily. 11/17/21   Kerman Vina HERO, NP  polyethylene glycol powder (GLYCOLAX /MIRALAX ) 17 GM/SCOOP powder Take 17 g by mouth daily as needed for mild constipation. 09/27/21   Geiple, Joshua, PA-C  promethazine -dextromethorphan (PROMETHAZINE -DM) 6.25-15 MG/5ML syrup Take 5 mLs by mouth 4 (four) times daily as needed for cough. 12/29/22   Rolinda Rogue, MD  pseudoephedrine  (SUDAFED) 60 MG tablet Take 1 tablet (60 mg total) by mouth every 8 (eight) hours as needed for congestion. 03/08/22   Christopher Savannah, PA-C  SUMAtriptan  (IMITREX ) 100 MG tablet Take 1 tablet at earliest onset of migraine.  May repeat in 2 hours if needed.  Maximum 2 tablets in 24 hours. 04/22/22   Skeet Juliene SAUNDERS, DO  topiramate  (TOPAMAX ) 25 MG tablet Take 1 tablet (25 mg total) by mouth at bedtime. 04/22/22   Skeet Juliene SAUNDERS, DO  tretinoin  (RETIN-A ) 0.025 % cream Apply pea size amount to cleansed face every other day at bedtime. Increase to every night as tolerated. 06/30/21     triamcinolone  cream (KENALOG ) 0.1 % Apply to affected areas twice daily for 3 days on and 3 days off cycle or as needed. 06/30/21     triamcinolone  ointment (KENALOG ) 0.5 % 1 application 2 times daily as needed to itchy/ inflamed areas from allergy  injections. 08/11/22   Jeneal Danita Macintosh, MD   Vitamin D , Ergocalciferol , (DRISDOL) 1.25 MG (50000 UNIT) CAPS capsule Take by mouth. 07/07/22   [provider]    Family History Family History  Problem Relation Age of Onset   Hypertension Mother    Hypertension Father    Breast cancer Maternal Grandmother        35   Colon cancer Neg Hx    Colon polyps Neg Hx    Kidney disease Neg Hx    Diabetes Neg Hx    Esophageal cancer Neg Hx    Gallbladder disease Neg Hx    Heart disease Neg Hx    Asthma Neg Hx    Stroke Neg Hx    Stomach cancer Neg Hx     Social History Social History   Tobacco Use   Smoking status: Never    Passive exposure: Never   Smokeless tobacco: Never  Vaping Use   Vaping status: Never Used  Substance Use Topics   Alcohol use: No   Drug use: Never     Allergies   Macrobid  [nitrofurantoin ], Augmentin  [amoxicillin -pot clavulanate], Cefdinir , and Metformin  and related   Review of Systems Review of Systems  Constitutional:  Negative for chills and fever.  HENT:  Positive for congestion and ear pain. Negative for sore throat.   Eyes:  Negative for discharge and redness.  Respiratory:  Negative for cough and shortness of breath.   Gastrointestinal:  Negative for abdominal pain, nausea and vomiting.     Physical Exam Triage Vital Signs ED Triage Vitals  Encounter Vitals Group     BP 03/11/23 1033 130/80     Systolic BP Percentile --      Diastolic BP Percentile --      Pulse Rate 03/11/23 1033 100     Resp 03/11/23 1033 18     Temp 03/11/23 1033 98 F (36.7 C)     Temp Source 03/11/23 1033 Oral  SpO2 03/11/23 1033 99 %     Weight 03/11/23 1031 195 lb (88.5 kg)     Height 03/11/23 1031 4' 11 (1.499 m)     Head Circumference --      Peak Flow --      Pain Score 03/11/23 1030 9     Pain Loc --      Pain Education --      Exclude from Growth Chart --    No data found.  Updated Vital Signs BP 130/80 (BP Location: Left Arm)   Pulse 96   Temp 98 F (36.7 C) (Oral)   Resp  18   Ht 4' 11 (1.499 m)   Wt 195 lb (88.5 kg)   LMP 03/08/2023 (Exact Date)   SpO2 99%   BMI 39.39 kg/m   Visual Acuity Right Eye Distance:   Left Eye Distance:   Bilateral Distance:    Right Eye Near:   Left Eye Near:    Bilateral Near:     Physical Exam Vitals and nursing note reviewed.  Constitutional:      General: She is not in acute distress.    Appearance: Normal appearance. She is not ill-appearing.  HENT:     Head: Normocephalic and atraumatic.     Right Ear: Tympanic membrane normal.     Ears:     Comments: Left TM dull    Nose: Congestion present.     Mouth/Throat:     Mouth: Mucous membranes are moist.     Pharynx: Oropharynx is clear. No oropharyngeal exudate or posterior oropharyngeal erythema.  Eyes:     Conjunctiva/sclera: Conjunctivae normal.  Cardiovascular:     Rate and Rhythm: Normal rate and regular rhythm.  Pulmonary:     Effort: Pulmonary effort is normal. No respiratory distress.     Breath sounds: No wheezing, rhonchi or rales.  Neurological:     Mental Status: She is alert.  Psychiatric:        Mood and Affect: Mood normal.        Behavior: Behavior normal.        Thought Content: Thought content normal.      UC Treatments / Results  Labs (all labs ordered are listed, but only abnormal results are displayed) Labs Reviewed - No data to display  EKG   Radiology No results found.  Procedures Procedures (including critical care time)  Medications Ordered in UC Medications - No data to display  Initial Impression / Assessment and Plan / UC Course  I have reviewed the triage vital signs and the nursing notes.  Pertinent labs & imaging results that were available during my care of the patient were reviewed by me and considered in my medical decision making (see chart for details).    Doxycycline  prescribed to cover possible impending otitis media and recommended she continue Flonase  daily.  Encouraged follow-up with any  further concerns.  Final Clinical Impressions(s) / UC Diagnoses   Final diagnoses:  Acute otalgia, left   Discharge Instructions   None    ED Prescriptions     Medication Sig Dispense Auth. Provider   doxycycline  (VIBRAMYCIN ) 100 MG capsule Take 1 capsule (100 mg total) by mouth 2 (two) times daily for 7 days. 14 capsule Billy Asberry FALCON, PA-C      PDMP not reviewed this encounter.   Billy Asberry FALCON, PA-C 03/11/23 1122

## 2023-03-12 ENCOUNTER — Telehealth: Payer: Self-pay | Admitting: *Deleted

## 2023-03-12 LAB — FOLLICLE STIMULATING HORMONE: FSH: 6.4 m[IU]/mL

## 2023-03-12 LAB — THYROID PANEL WITH TSH
Free Thyroxine Index: 2.1 (ref 1.2–4.9)
T3 Uptake Ratio: 26 % (ref 24–39)
T4, Total: 8 ug/dL (ref 4.5–12.0)
TSH: 2.11 u[IU]/mL (ref 0.450–4.500)

## 2023-03-12 LAB — PROLACTIN: Prolactin: 7.7 ng/mL (ref 4.8–33.4)

## 2023-03-12 MED ORDER — AZITHROMYCIN 250 MG PO TABS: 250.0000 mg | ORAL_TABLET | Freq: Every day | ORAL | 0 refills | Status: DC

## 2023-03-12 NOTE — Telephone Encounter (Signed)
 Pt called c/o medicine prescribed yesterday making her feel "weird" dizzy and nauseated. Prescribed doxycycline yesterday. States she is taking it with food.

## 2023-03-12 NOTE — Telephone Encounter (Signed)
 Discontinued doxycycline.  Prescription for azithromycin sent to pharmacy.  If she is having any worsening or changing symptoms she should be seen immediately.

## 2023-03-14 LAB — BETA HCG QUANT (REF LAB): hCG Quant: <1 m[IU]/mL

## 2023-03-14 LAB — SPECIMEN STATUS REPORT

## 2023-03-15 ENCOUNTER — Ambulatory Visit (INDEPENDENT_AMBULATORY_CARE_PROVIDER_SITE_OTHER): Payer: Medicaid Other

## 2023-03-15 DIAGNOSIS — J309 Allergic rhinitis, unspecified: Secondary | ICD-10-CM

## 2023-03-18 ENCOUNTER — Ambulatory Visit
Admission: RE | Admit: 2023-03-18 | Discharge: 2023-03-18 | Disposition: A | Discharge status: Home / Self Care | Payer: Medicaid Other | Source: Ambulatory Visit | Attending: Podiatry | Admitting: Podiatry

## 2023-03-18 DIAGNOSIS — M722 Plantar fascial fibromatosis: Secondary | ICD-10-CM

## 2023-03-19 ENCOUNTER — Ambulatory Visit (HOSPITAL_BASED_OUTPATIENT_CLINIC_OR_DEPARTMENT_OTHER): Payer: Medicaid Other

## 2023-03-19 ENCOUNTER — Other Ambulatory Visit (HOSPITAL_COMMUNITY): Payer: Medicaid Other

## 2023-03-23 ENCOUNTER — Ambulatory Visit (HOSPITAL_BASED_OUTPATIENT_CLINIC_OR_DEPARTMENT_OTHER)
Admission: RE | Admit: 2023-03-23 | Discharge: 2023-03-23 | Disposition: A | Discharge status: Home / Self Care | Payer: Medicaid Other | Source: Ambulatory Visit | Attending: Obstetrics and Gynecology | Admitting: Obstetrics and Gynecology

## 2023-03-23 DIAGNOSIS — N926 Irregular menstruation, unspecified: Secondary | ICD-10-CM | POA: Insufficient documentation

## 2023-04-14 ENCOUNTER — Ambulatory Visit (INDEPENDENT_AMBULATORY_CARE_PROVIDER_SITE_OTHER): Payer: Self-pay

## 2023-04-14 ENCOUNTER — Other Ambulatory Visit: Payer: Self-pay

## 2023-04-14 ENCOUNTER — Ambulatory Visit (HOSPITAL_COMMUNITY)
Admission: EM | Admit: 2023-04-14 | Discharge: 2023-04-14 | Disposition: A | Discharge status: Home / Self Care | Payer: Medicaid Other | Attending: Sports Medicine | Admitting: Sports Medicine

## 2023-04-14 ENCOUNTER — Encounter (HOSPITAL_COMMUNITY): Payer: Self-pay | Admitting: *Deleted

## 2023-04-14 DIAGNOSIS — J309 Allergic rhinitis, unspecified: Secondary | ICD-10-CM

## 2023-04-14 DIAGNOSIS — N898 Other specified noninflammatory disorders of vagina: Secondary | ICD-10-CM | POA: Insufficient documentation

## 2023-04-14 DIAGNOSIS — G5603 Carpal tunnel syndrome, bilateral upper limbs: Secondary | ICD-10-CM | POA: Insufficient documentation

## 2023-04-14 LAB — POCT URINALYSIS DIP (MANUAL ENTRY)
Bilirubin, UA: NEGATIVE
Blood, UA: NEGATIVE
Glucose, UA: NEGATIVE mg/dL
Ketones, POC UA: NEGATIVE mg/dL
Leukocytes, UA: NEGATIVE
Nitrite, UA: NEGATIVE
Protein Ur, POC: NEGATIVE mg/dL
Spec Grav, UA: 1.025 (ref 1.010–1.025)
Urobilinogen, UA: 1 U/dL
pH, UA: 6.5 (ref 5.0–8.0)

## 2023-04-14 LAB — POCT URINE PREGNANCY: Preg Test, Ur: NEGATIVE

## 2023-04-14 MED ORDER — PREDNISONE 10 MG PO TABS: 20.0000 mg | ORAL_TABLET | Freq: Every day | ORAL | 0 refills | Status: DC

## 2023-04-14 NOTE — ED Triage Notes (Signed)
Pt reports she has had bil. Hand numbness and pain for one week. Pt dose house keeping as a job. Pt also reports vag discharge with itching and dysuria.

## 2023-04-14 NOTE — ED Provider Notes (Signed)
[facility]    CSN: [id number] Arrival date & time: 04/14/23  0801      History   Chief Complaint Chief Complaint  Patient presents with   Hand Pain   Vaginal Discharge   Vaginal Itching    HPI Sherri [name] is a 38 y.o. female here with 1 week of bilateral hand/wrist pain and vaginal discharge with itching.   She denies any injury to either her wrist or her hands but does work as a Advertising copywriter and states that she is repetitively using her hands and wrists with work.  She describes the pain as a burning pain that starts in the wrist and will radiate down to all the fingers of her hands.  She finds her self repeatedly clenching her fists to try and help with the pain.  She is also been taking Tylenol and ibuprofen at home and reports that these have not been unhelpful for her pain.  She has no previous history of carpal tunnel syndrome or similar presentation of pain.  No previous hand surgeries.  Regarding the vaginal discharge she states that this has been going on for a week.  Her biggest complaint is the itchiness accompanied by this.  She is sexually active and denies history of sexually transmitted infections though is amenable to testing today.  Denies any abdominal pain, fever, chills, pain with urination though she does feel like she is urinating more frequently.  Hand Pain  Vaginal Discharge Associated symptoms: vaginal itching   Vaginal Itching    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    Urinary tract infection    Uterine fibroid     Patient Active Problem List   Diagnosis Date Noted   Moderate episode of recurrent major depressive disorder (HCC) 07/16/2022   Pre-diabetes 07/23/2021   Anxiety and depression 06/24/2021   Fibromyalgia 06/24/2021   Chronic  sinusitis 12/26/2020   Sinus pressure 12/26/2020   Prediabetes 07/05/2020   Gastroesophageal reflux disease 04/26/2020   Perianal rash 03/26/2020   Chronic nausea 03/26/2020   Chronic constipation 03/09/2019   Pelvic pain in female 03/09/2019   Hypertension    Genital herpes    Uterine fibroid 12/25/2013   Obesity 12/25/2013   Bacterial vaginitis 04/27/2012    Past Surgical History:  Procedure Laterality Date   CESAREAN SECTION N/A 06/05/2014   Procedure: CESAREAN SECTION;  Surgeon: Levie Heritage, DO;  Location: [facility];  Service: Obstetrics;  Laterality: N/A;   WISDOM TOOTH EXTRACTION      OB History     Gravida  1   Para  1   Term      Preterm  1   AB      Living  2      SAB      IAB      Ectopic      Multiple  1   Live Births  2            Home Medications    Prior to Admission medications   Medication Sig Start Date End Date Taking? Authorizing Provider  predniSONE (DELTASONE) 10 MG tablet Take 2 tablets (20 mg total) by mouth daily. 04/14/23  Yes Marisa Cyphers, MD  amitriptyline (ELAVIL) 50 MG tablet Take 50 mg by  mouth at bedtime. 06/19/22  Yes [provider]  amLODipine (NORVASC) 5 MG tablet Take 1 tablet by mouth daily. 08/11/22  Yes [provider]  Azelastine-Fluticasone 137-50 MCG/ACT SUSP 1 spray each nostril twice a day as needed for nasal congestion or drainage control 02/18/23   Marcelyn Bruins, MD  benzonatate (TESSALON) 100 MG capsule Take 1 capsule (100 mg total) by mouth every 8 (eight) hours. Patient not taking: Reported on 03/11/2023 10/17/22   Carlisle Beers, FNP  Blood Pressure Monitoring (BLOOD PRESSURE KIT) KIT Use to check blood pressure twice daily and records BP logs nd bring to your office visits 08/03/22  Yes [provider]  Cholecalciferol (VITAMIN D3) 125 MCG (5000 UT) CAPS Take 1 capsule by mouth daily.   Yes [provider]  clindamycin (CLEOCIN T) 1 % lotion Apply to  cleansed face daily in the morning. 06/30/21     EPINEPHrine (EPIPEN 2-PAK) 0.3 mg/0.3 mL IJ SOAJ injection Inject 0.3 mg into the muscle as needed for anaphylaxis. 12/31/22   Marcelyn Bruins, MD  famotidine (PEPCID) 40 MG tablet Take 1 tablet (40 mg total) by mouth at bedtime. 02/18/23   Marcelyn Bruins, MD  Ferrous Sulfate (IRON) 28 MG TABS Take 1 tablet by mouth daily.   Yes [provider]  fosfomycin (MONUROL) 3 g PACK SMARTSIG:3 Gram(s) By Mouth Once 06/16/22   [provider]  gemfibrozil (LOPID) 600 MG tablet Take by mouth. 06/01/22  Yes [provider]  hydrOXYzine (ATARAX) 25 MG tablet TAKE 1 TABLET BY MOUTH THREE TIMES A DAY AS NEEDED FOR ANXIETY FOR UP TO 90 DAYS 08/11/22  Yes [provider]  ibuprofen (ADVIL) 600 MG tablet Take 1 tablet (600 mg total) by mouth every 6 (six) hours as needed for headache, mild pain (pain score 1-3), moderate pain (pain score 4-6) or cramping. 03/11/23   Warden Fillers, MD  Na Sulfate-K Sulfate-Mg Sulf 17.5-3.13-1.6 GM/177ML SOLN See admin instructions. 04/10/22  Yes [provider]  omeprazole (PRILOSEC) 40 MG capsule Take 1 capsule (40 mg total) by mouth every morning. 02/18/23   Marcelyn Bruins, MD  ondansetron (ZOFRAN) 4 MG tablet PLEASE SEE ATTACHED FOR DETAILED DIRECTIONS 07/08/22  Yes [provider]  ondansetron (ZOFRAN-ODT) 4 MG disintegrating tablet Take by mouth. 08/27/22  Yes [provider]  Vitamin D, Ergocalciferol, (DRISDOL) 1.25 MG (50000 UNIT) CAPS capsule Take by mouth. 07/07/22   [provider]    Family History Family History  Problem Relation Age of Onset   Hypertension Mother    Hypertension Father    Breast cancer Maternal Grandmother        14   Colon cancer Neg Hx    Colon polyps Neg Hx    Kidney disease Neg Hx    Diabetes Neg Hx    Esophageal cancer Neg Hx    Gallbladder disease Neg Hx    Heart disease Neg Hx    Asthma Neg Hx     Stroke Neg Hx    Stomach cancer Neg Hx     Social History Social History   Tobacco Use   Smoking status: Never    Passive exposure: Never   Smokeless tobacco: Never  Vaping Use   Vaping status: Never Used  Substance Use Topics   Alcohol use: No   Drug use: Never     Allergies   Macrobid [nitrofurantoin], Augmentin [amoxicillin-pot clavulanate], Cefdinir, and Metformin and related   Review of Systems  Review of Systems  Genitourinary:  Positive for vaginal discharge.     Physical Exam Triage Vital Signs ED Triage Vitals  Encounter Vitals Group     BP 04/14/23 0835 129/83     Systolic BP Percentile --      Diastolic BP Percentile --      Pulse Rate 04/14/23 0835 81     Resp 04/14/23 0835 18     Temp 04/14/23 0835 97.8 F (36.6 C)     Temp src --      SpO2 04/14/23 0835 99 %     Weight --      Height --      Head Circumference --      Peak Flow --      Pain Score 04/14/23 0829 9     Pain Loc --      Pain Education --      Exclude from Growth Chart --    No data found.  Updated Vital Signs BP 129/83   Pulse 81   Temp 97.8 F (36.6 C)   Resp 18   LMP 04/02/2023 (Approximate)   SpO2 99%   Visual Acuity Right Eye Distance:   Left Eye Distance:   Bilateral Distance:    Right Eye Near:   Left Eye Near:    Bilateral Near:     Physical Exam Constitutional:      General: She is not in acute distress.    Appearance: Normal appearance. She is not toxic-appearing.  HENT:     Head: Normocephalic and atraumatic.  Eyes:     Extraocular Movements: Extraocular movements intact.     Conjunctiva/sclera: Conjunctivae normal.     Pupils: Pupils are equal, round, and reactive to light.  Cardiovascular:     Rate and Rhythm: Normal rate and regular rhythm.     Pulses: Normal pulses.     Heart sounds: Normal heart sounds.  Pulmonary:     Effort: Pulmonary effort is normal.     Breath sounds: Normal breath sounds.  Abdominal:     General: There is no  distension.     Palpations: Abdomen is soft.     Tenderness: There is no abdominal tenderness. There is no guarding.  Musculoskeletal:     Cervical back: Normal range of motion and neck supple.     Comments: Bilateral Wrists/Hands: No appreciable bony deformity or swelling.  She has tender to palpation at the radiocarpal and ulnocarpal joints.  Pain is worsened with wrist flexion and wrist extension. She is able to make a full handgrip though has some weakness bilaterally Normal sensation to light touch. No tenderness to palpation in the anatomical snuffbox Positive Tinel's and wounds.  Negative Finkelstein's.  Skin:    General: Skin is warm.     Findings: No rash.  Neurological:     General: No focal deficit present.     Mental Status: She is alert and oriented to person, place, and time. Mental status is at baseline.  Psychiatric:        Mood and Affect: Mood normal.        Behavior: Behavior normal.      UC Treatments / Results  Labs (all labs ordered are listed, but only abnormal results are displayed) Labs Reviewed  POCT URINALYSIS DIP (MANUAL ENTRY) - Abnormal; Notable for the following components:      Result Value   Clarity, UA cloudy (*)    All other components within normal limits  POCT URINE  PREGNANCY  CERVICOVAGINAL ANCILLARY ONLY    EKG   Radiology No results found.  Procedures Procedures (including critical care time)  Medications Ordered in UC Medications - No data to display  Initial Impression / Assessment and Plan / UC Course  I have reviewed the triage vital signs and the nursing notes.  Pertinent labs & imaging results that were available during my care of the patient were reviewed by me and considered in my medical decision making (see chart for details).    Vitals and triage reviewed, patient is hemodynamically stable.  Bilateral wrist pain most consistent with carpal tunnel syndrome.  We discussed the mainstay of therapy being bracing  primarily at night though can do as tolerated during the day to limit repetitive wrist flexion extension.  Given her pain we did discuss also a short course of prednisone and she is amenable to this, prescription for 40 mg x 5 days provided.  Recommended follow-up with sports medicine in 3 to 4 weeks if she is failing to notice any improvement with conservative management.  Regarding her vaginal discharge I have sent a vaginal swab to check for gonorrhea, chlamydia, trichomonas, bacterial vaginosis, and vaginal candidiasis. Exam and urinalysis without concern for UTI. Discussed with her I will send appropriate medications if any of these return positive.  Patient's questions were answered and they are in agreement with this plan.   Final Clinical Impressions(s) / UC Diagnoses   Final diagnoses:  Bilateral carpal tunnel syndrome  Vaginal discharge     Discharge Instructions      You have carpal tunnel syndrome of both wrists. We have put you into wrist braces which are the first line treatment for this. Wear these every night and can wear them during the day as well to keep your wrist in neutral position. I have also sent you a 5 day course of prednisone to help with inflammation and irritation of the nerves being compressed.  If this isn't improving over the next 4 weeks, call the sports medicine office for consideration of a Ultrasound guided injection. [facility] Address: [address] [place], [place], [place] [address] Phone: [phone]   We have pending swabs for the vaginal discharge. If something returns on these swabs we will be in touch and I will send medication to help.   ED Prescriptions     Medication Sig Dispense Auth. Provider   predniSONE (DELTASONE) 10 MG tablet Take 2 tablets (20 mg total) by mouth daily. 10 tablet Marisa Cyphers, MD      PDMP not reviewed this encounter.   Marisa Cyphers, MD 04/14/23 [phone]

## 2023-04-14 NOTE — Discharge Instructions (Addendum)
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 We have put you into wrist braces which are the first line treatment for this. Wear these every night and can wear them during the day as well to keep your wrist in neutral position. I have also sent you a 5 day course of prednisone to help with inflammation and irritation of the nerves being compressed.  If this isn't improving over the next 4 weeks, call the sports medicine office for consideration of a Ultrasound guided injection. [facility] Address: [address] [place], [place], [place] [address] Phone: [phone]   We have pending swabs for the vaginal discharge. If something returns on these swabs we will be in touch and I will send medication to help.

## 2023-04-15 ENCOUNTER — Ambulatory Visit: Payer: Medicaid Other | Admitting: Obstetrics and Gynecology

## 2023-04-15 ENCOUNTER — Telehealth (HOSPITAL_COMMUNITY): Payer: Self-pay

## 2023-04-15 VITALS — BP 136/84 | HR 79 | Wt 196.0 lb

## 2023-04-15 DIAGNOSIS — B9689 Other specified bacterial agents as the cause of diseases classified elsewhere: Secondary | ICD-10-CM

## 2023-04-15 DIAGNOSIS — N760 Acute vaginitis: Secondary | ICD-10-CM | POA: Diagnosis not present

## 2023-04-15 DIAGNOSIS — Z712 Person consulting for explanation of examination or test findings: Secondary | ICD-10-CM | POA: Diagnosis not present

## 2023-04-15 LAB — CERVICOVAGINAL ANCILLARY ONLY
Bacterial Vaginitis (gardnerella): POSITIVE — AB
Candida Glabrata: NEGATIVE
Candida Vaginitis: NEGATIVE
Chlamydia: NEGATIVE
Comment: NEGATIVE
Comment: NEGATIVE
Comment: NEGATIVE
Comment: NEGATIVE
Comment: NEGATIVE
Comment: NORMAL
Neisseria Gonorrhea: NEGATIVE
Trichomonas: NEGATIVE

## 2023-04-15 MED ORDER — METRONIDAZOLE 500 MG PO TABS: 500.0000 mg | ORAL_TABLET | Freq: Two times a day (BID) | ORAL | 0 refills | Status: AC

## 2023-04-15 MED ORDER — SLYND 4 MG PO TABS: 1.0000 | ORAL_TABLET | Freq: Every day | ORAL | 4 refills | Status: DC

## 2023-04-15 MED ORDER — METRONIDAZOLE 0.75 % VA GEL: 1.0000 | Freq: Every day | VAGINAL | 0 refills | Status: DC

## 2023-04-15 NOTE — Progress Notes (Signed)
Pt is in office for f/u from 1/9 visit - pt had u/s and labs done. ?discussion about OCP to help with cycle control.

## 2023-04-15 NOTE — Progress Notes (Signed)
38 yo returning today to discuss test results. Patient reports feeling well and is without any new complaints. She reports a monthly period associated with dysmenorrhea.  Blood pressure 136/84, pulse 79, weight 196 lb (88.9 kg), last menstrual period 04/02/2023. GENERAL: Well-developed, well-nourished female in no acute distress.  NEURO: alert and oriented x 3  US PELVIC COMPLETE WITH TRANSVAGINAL Result Date: 03/25/2023 CLINICAL DATA:  Initial evaluation for irregular menses EXAM: TRANSABDOMINAL AND TRANSVAGINAL ULTRASOUND OF PELVIS TECHNIQUE: Both transabdominal and transvaginal ultrasound examinations of the pelvis were performed. Transabdominal technique was performed for global imaging of the pelvis including uterus, ovaries, adnexal regions, and pelvic cul-de-sac. It was necessary to proceed with endovaginal exam following the transabdominal exam to visualize the endometrium. COMPARISON:  Prior CT from 04/22/2020 and ultrasound from 01/02/2017. FINDINGS: Uterus Measurements: 9.9 x 4.7 x 4.9 cm = volume: 119.5 mL. Uterus is anteverted. No discrete fibroid or other myometrial abnormality. Few small nabothian cysts noted about the cervix. Endometrium Thickness: 13 mm.  No focal abnormality visualized. Right ovary Measurements: 4.2 x 2.0 x 3.0 cm = volume: 13.0 mL. Normal appearance/no adnexal mass. Left ovary Measurements: 4.0 x 2.7 x 1.9 cm = volume: 11.1 mL. Normal appearance/no adnexal mass. Other findings Trace free fluid noted within the pelvis. IMPRESSION: 1. Endometrial stripe within normal limits measuring 13 mm in thickness. If bleeding remains unresponsive to hormonal or medical therapy, sonohysterogram should be considered for focal lesion work-up. (Ref: Radiological Reasoning: Algorithmic Workup of Abnormal Vaginal Bleeding with Endovaginal Sonography and Sonohysterography. AJR 2008; 161:W96-04). 2. Trace free fluid within the pelvis, nonspecific, but most commonly physiologic. 3. Otherwise  unremarkable and normal pelvic ultrasound. Electronically Signed   By: Rise Mu M.D.   On: 03/25/2023 03:50   MR HEEL LEFT WO CONTRAST Result Date: 03/23/2023 CLINICAL DATA:  Left heel pain for 1 year. Evaluate for plantar fasciitis. EXAM: MR OF THE LEFT HEEL WITHOUT CONTRAST TECHNIQUE: Multiplanar, multisequence MR imaging of the left hindfoot was performed. No intravenous contrast was administered. COMPARISON:  left foot radiographs 02/20/2023 FINDINGS: TENDONS Peroneal: The peroneus longus and brevis tendons are intact. Posteromedial: Mild posterior tibial tenosynovitis. The flexor digitorum longus and flexor hallucis longus tendons are intact. Anterior: The tibialis anterior, extensor hallucis longus, and extensor digitorum longus tendons are intact. Achilles: Intact. Plantar Fascia: Small plantar calcaneal heel spur with mild associated marrow edema. Moderate intermediate T2 signal within the medial band of the plantar fascia with moderate surrounding edema (coronal series 107 images 9 through 15, sagittal series 106 images 14 through 16). There is mild laxity/waviness of the medial band of the plantar fascia approximately 1.2 cm distal to its origin (sagittal images 13 and 14) without associated attenuation, suspicious for mild subacute to chronic partial-thickness tearing. No full-thickness tear is seen. The lateral band of the plantar fascia is normal in thickness. LIGAMENTS Lateral: The anterior and posterior talofibular, anterior and posterior tibiofibular, and calcaneofibular ligaments are intact. Medial: The tibiotalar deep deltoid and tibial spring ligaments are intact. CARTILAGE Ankle Joint: Intact cartilage. Subtalar Joints/Sinus Tarsi: The subtalar cartilage is intact. Fat is preserved within the sinus tarsi. Bones: No acute fracture. Other: The tarsal tunnel is unremarkable. The Lisfranc ligament complex is intact. IMPRESSION: 1. Moderate plantar fasciitis of the medial band with mild  laxity/waviness suggesting mild subacute to chronic partial-thickness tearing approximately 1.2 cm distal to its origin. No full-thickness tear is seen. 2. Small plantar calcaneal heel spur with mild associated marrow edema. 3. Mild posterior tibial tenosynovitis. Electronically  Signed   By: Neita Garnet M.D.   On: 03/23/2023 17:57    A/P 38 yo here to discuss test results - Reviewed normal labs with patient - Patient is interested in hormonal contraception for cycle control. Rx Slynd provided - RTC if no improvement in cycle

## 2023-04-15 NOTE — Telephone Encounter (Signed)
Per protocol, pt requires tx with metronidazole. Rx sent to pharmacy on file.

## 2023-04-22 ENCOUNTER — Telehealth: Payer: Self-pay | Admitting: Podiatry

## 2023-04-22 ENCOUNTER — Other Ambulatory Visit: Payer: Self-pay | Admitting: Allergy and Immunology

## 2023-04-22 NOTE — Telephone Encounter (Signed)
DOS-06/04/23  EPF [id number] GASTROCNEMIUS RECESS [id number]  [facility] EFFECTIVE DATE- 04/03/23  SPOKE WITH JESSICA M FROM [facility] MEDICAID AND SHE STATED THAT PRIOR AUTH IS NOT REQUIRED FOR CPT CODES [id number] AND [id number].  CALL REF #: [phone]

## 2023-04-27 ENCOUNTER — Ambulatory Visit
Admission: EM | Admit: 2023-04-27 | Discharge: 2023-04-27 | Disposition: A | Discharge status: Home / Self Care | Payer: Medicaid Other | Attending: Family Medicine | Admitting: Family Medicine

## 2023-04-27 DIAGNOSIS — J069 Acute upper respiratory infection, unspecified: Secondary | ICD-10-CM | POA: Diagnosis not present

## 2023-04-27 DIAGNOSIS — K529 Noninfective gastroenteritis and colitis, unspecified: Secondary | ICD-10-CM | POA: Diagnosis not present

## 2023-04-27 LAB — POCT FASTING CBG KUC MANUAL ENTRY: POCT Glucose (KUC): 110 mg/dL — AB (ref 70–99)

## 2023-04-27 LAB — POC COVID19/FLU A&B COMBO
Covid Antigen, POC: NEGATIVE
Influenza A Antigen, POC: NEGATIVE
Influenza B Antigen, POC: NEGATIVE

## 2023-04-27 MED ORDER — ONDANSETRON 4 MG PO TBDP
4.0000 mg | ORAL_TABLET | Freq: Once | ORAL | Status: AC
  Administered 2023-04-27: 4 mg via ORAL

## 2023-04-27 MED ORDER — ONDANSETRON 4 MG PO TBDP: 4.0000 mg | ORAL_TABLET | Freq: Three times a day (TID) | ORAL | 0 refills | Status: DC | PRN

## 2023-04-27 NOTE — ED Triage Notes (Signed)
 This started Friday with loss off appetite, weakness, fatigue, nausea as well (no vomiting). Some runny nose at times. No cough. No fever. Last void "this morning". Stools "hard" (on-going). Reached out to PCP (no appts).

## 2023-04-27 NOTE — ED Provider Notes (Signed)
 [facility]    CSN: [id number] Arrival date & time: 04/27/23  4098      History   Chief Complaint Chief Complaint  Patient presents with   Weakness   Dizziness    HPI Sherri [name] is a 38 y.o. female.    Weakness Associated symptoms: dizziness   Dizziness Associated symptoms: weakness   Here for dizziness and malaise and nausea and weakness.  Symptoms began on February 21.  No fever noted.  She has not had any vomiting or diarrhea.  She has maybe had some stomach cramping.  Not really any cough but she has had some rhinorrhea.  No shortness of breath.  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  She is allergic to cefdinir, Augmentin, and Macrodantin, and metformin.  Her menstrual cycle started today.  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    Urinary tract infection    Uterine fibroid     Patient Active Problem List   Diagnosis Date Noted   Moderate episode of recurrent major depressive disorder (HCC) 07/16/2022   Pre-diabetes 07/23/2021   Anxiety and depression 06/24/2021   Fibromyalgia 06/24/2021   Chronic sinusitis 12/26/2020   Sinus pressure 12/26/2020   Prediabetes 07/05/2020   Gastroesophageal reflux disease 04/26/2020   Perianal rash 03/26/2020   Chronic nausea 03/26/2020   Chronic constipation 03/09/2019   Pelvic pain in female 03/09/2019   Hypertension    Genital herpes    Uterine fibroid 12/25/2013   Obesity 12/25/2013   Bacterial vaginitis 04/27/2012    Past Surgical History:  Procedure Laterality Date   CESAREAN SECTION N/A 06/05/2014   Procedure: CESAREAN SECTION;  Surgeon: Levie Heritage, DO;  Location: [facility];  Service: Obstetrics;  Laterality: N/A;   WISDOM TOOTH EXTRACTION       OB History     Gravida  1   Para  1   Term      Preterm  1   AB      Living  2      SAB      IAB      Ectopic      Multiple  1   Live Births  2            Home Medications    Prior to Admission medications   Medication Sig Start Date End Date Taking? Authorizing Provider  ondansetron (ZOFRAN-ODT) 4 MG disintegrating tablet Take 1 tablet (4 mg total) by mouth every 8 (eight) hours as needed for nausea or vomiting. 04/27/23  Yes Camren Lipsett, Janace Aris, MD  amitriptyline (ELAVIL) 50 MG tablet Take 50 mg by mouth at bedtime. 06/19/22   [provider]  amLODipine (NORVASC) 5 MG tablet Take 1 tablet by mouth daily. 08/11/22   [provider]  Azelastine-Fluticasone 137-50 MCG/ACT SUSP 1 spray each nostril twice a day as needed for nasal congestion or drainage control 02/18/23   Marcelyn Bruins, MD  Blood Pressure Monitoring (BLOOD PRESSURE KIT) KIT Use to check blood pressure twice daily and records BP logs nd bring to your office visits 08/03/22   [provider]  Cholecalciferol (VITAMIN D3) 125 MCG (5000 UT) CAPS Take 1 capsule by mouth daily.  [provider]  clindamycin (CLEOCIN T) 1 % lotion Apply to cleansed face daily in the morning. 06/30/21     Drospirenone (SLYND) 4 MG TABS Take 1 tablet (4 mg total) by mouth daily. 04/15/23   Constant, Peggy, MD  EPINEPHrine (EPIPEN 2-PAK) 0.3 mg/0.3 mL IJ SOAJ injection Inject 0.3 mg into the muscle as needed for anaphylaxis. 12/31/22   Marcelyn Bruins, MD  famotidine (PEPCID) 40 MG tablet Take 1 tablet (40 mg total) by mouth at bedtime. 02/18/23   Marcelyn Bruins, MD  Ferrous Sulfate (IRON) 28 MG TABS Take 1 tablet by mouth daily.    [provider]  fluticasone (FLONASE) 50 MCG/ACT nasal spray Place 1 spray into both nostrils daily. 04/20/23   [provider]  fosfomycin (MONUROL) 3 g PACK SMARTSIG:3 Gram(s) By Mouth Once 06/16/22   [provider]  gemfibrozil (LOPID) 600 MG tablet Take by mouth. 06/01/22   [provider]  hydrOXYzine (ATARAX) 25 MG tablet TAKE 1 TABLET BY MOUTH THREE TIMES A DAY AS NEEDED FOR ANXIETY FOR UP TO 90 DAYS 08/11/22   [provider]  ibuprofen (ADVIL) 600 MG tablet Take 1 tablet (600 mg total) by mouth every 6 (six) hours as needed for headache, mild pain (pain score 1-3), moderate pain (pain score 4-6) or cramping. 03/11/23   Warden Fillers, MD  lisinopril (ZESTRIL) 10 MG tablet Take 10 mg by mouth daily. 04/22/23 07/21/23  [provider]  metroNIDAZOLE (METROGEL) 0.75 % vaginal gel Place 1 Applicatorful vaginally at bedtime. 04/15/23   Constant, Peggy, MD  Na Sulfate-K Sulfate-Mg Sulf 17.5-3.13-1.6 GM/177ML SOLN See admin instructions. 04/10/22   [provider]  omeprazole (PRILOSEC) 40 MG capsule Take 1 capsule (40 mg total) by mouth every morning. 02/18/23   Marcelyn Bruins, MD  Vitamin D, Ergocalciferol, (DRISDOL) 1.25 MG (50000 UNIT) CAPS capsule Take by mouth. 07/07/22   [provider]    Family History Family History  Problem Relation Age of Onset   Hypertension Mother    Hypertension Father    Breast cancer Maternal Grandmother        41   Colon cancer Neg Hx    Colon polyps Neg Hx    Kidney disease Neg Hx    Diabetes Neg Hx    Esophageal cancer Neg Hx    Gallbladder disease Neg Hx    Heart disease Neg Hx    Asthma Neg Hx    Stroke Neg Hx    Stomach cancer Neg Hx     Social History Social History   Tobacco Use   Smoking status: Never    Passive exposure: Never   Smokeless tobacco: Never  Vaping Use   Vaping status: Never Used  Substance Use Topics   Alcohol use: No   Drug use: Never     Allergies   Macrobid [nitrofurantoin], Augmentin [amoxicillin-pot clavulanate], Cefdinir, and Metformin and related   Review of Systems Review of Systems  Neurological:  Positive for dizziness and weakness.     Physical  Exam Triage Vital Signs ED Triage Vitals  Encounter Vitals Group     BP 04/27/23 0831 122/78     Systolic BP Percentile --      Diastolic BP Percentile --      Pulse Rate 04/27/23 0831 72     Resp 04/27/23 0831 18     Temp 04/27/23 0831 98.1 F (36.7 C)     Temp Source 04/27/23 0831 Oral  SpO2 04/27/23 0831 97 %     Weight 04/27/23 0829 196 lb (88.9 kg)     Height 04/27/23 0829 4\' 11"  (1.499 m)     Head Circumference --      Peak Flow --      Pain Score 04/27/23 0825 0     Pain Loc --      Pain Education --      Exclude from Growth Chart --    No data found.  Updated Vital Signs BP 122/78 (BP Location: Left Arm)   Pulse 72   Temp 98.1 F (36.7 C) (Oral)   Resp 18   Ht 4\' 11"  (1.499 m)   Wt 88.9 kg   LMP 04/27/2023 (Exact Date)   SpO2 97%   BMI 39.59 kg/m   Visual Acuity Right Eye Distance:   Left Eye Distance:   Bilateral Distance:    Right Eye Near:   Left Eye Near:    Bilateral Near:     Physical Exam Vitals reviewed.  Constitutional:      General: She is not in acute distress.    Appearance: She is not ill-appearing, toxic-appearing or diaphoretic.  HENT:     Right Ear: Tympanic membrane and ear canal normal.     Left Ear: Tympanic membrane and ear canal normal.     Nose: Congestion present.     Mouth/Throat:     Mouth: Mucous membranes are moist.     Pharynx: No oropharyngeal exudate or posterior oropharyngeal erythema.  Eyes:     Extraocular Movements: Extraocular movements intact.     Conjunctiva/sclera: Conjunctivae normal.     Pupils: Pupils are equal, round, and reactive to light.  Cardiovascular:     Rate and Rhythm: Normal rate and regular rhythm.     Heart sounds: No murmur heard. Pulmonary:     Effort: Pulmonary effort is normal. No respiratory distress.     Breath sounds: No stridor. No wheezing, rhonchi or rales.  Abdominal:     General: Bowel sounds are normal. There is no distension.     Palpations: Abdomen is soft.      Tenderness: There is no guarding.     Comments: There is mild generalized tenderness.  Musculoskeletal:     Cervical back: Neck supple.  Lymphadenopathy:     Cervical: No cervical adenopathy.  Skin:    Capillary Refill: Capillary refill takes less than 2 seconds.     Coloration: Skin is not jaundiced or pale.  Neurological:     General: No focal deficit present.     Mental Status: She is alert and oriented to person, place, and time.  Psychiatric:        Behavior: Behavior normal.      UC Treatments / Results  Labs (all labs ordered are listed, but only abnormal results are displayed) Labs Reviewed  POCT FASTING CBG [facility] MANUAL ENTRY - Abnormal; Notable for the following components:      Result Value   POCT Glucose ([facility]) 110 (*)    All other components within normal limits  POC COVID19/FLU A&B COMBO - Normal    EKG   Radiology No results found.  Procedures Procedures (including critical care time)  Medications Ordered in UC Medications  ondansetron (ZOFRAN-ODT) disintegrating tablet 4 mg (4 mg Oral Given 04/27/23 0835)    Initial Impression / Assessment and Plan / UC Course  I have reviewed the triage vital signs and the nursing notes.  Pertinent labs & imaging  results that were available during my care of the patient were reviewed by me and considered in my medical decision making (see chart for details).     Flu and COVID test is negative.  Zofran is sent in for the nausea. Work note provided. To the ER if worse Final Clinical Impressions(s) / UC Diagnoses   Final diagnoses:  Viral URI  Gastroenteritis     Discharge Instructions      The test for Flu and COVID was negative.  Ondansetron dissolved in the mouth every 8 hours as needed for nausea or vomiting. Clear liquids(water, gatorade/pedialyte, ginger ale/sprite, chicken broth/soup) and bland things(crackers/toast, rice, potato, bananas) to eat. Avoid acidic foods like lemon/lime/orange/tomato, and  avoid greasy/spicy foods.  If your symptoms worsen in anyway, please go to the emergency room for further evaluation.  Followup with your primary care provider about this issue.     ED Prescriptions     Medication Sig Dispense Auth. Provider   ondansetron (ZOFRAN-ODT) 4 MG disintegrating tablet Take 1 tablet (4 mg total) by mouth every 8 (eight) hours as needed for nausea or vomiting. 10 tablet Marlinda Mike Janace Aris, MD      PDMP not reviewed this encounter.   Zenia Resides, MD 04/27/23 [phone]

## 2023-04-27 NOTE — Discharge Instructions (Signed)
 The test for Flu and COVID was negative.  Ondansetron dissolved in the mouth every 8 hours as needed for nausea or vomiting. Clear liquids(water, gatorade/pedialyte, ginger ale/sprite, chicken broth/soup) and bland things(crackers/toast, rice, potato, bananas) to eat. Avoid acidic foods like lemon/lime/orange/tomato, and avoid greasy/spicy foods.  If your symptoms worsen in anyway, please go to the emergency room for further evaluation.  Followup with your primary care provider about this issue.

## 2023-05-12 ENCOUNTER — Ambulatory Visit (INDEPENDENT_AMBULATORY_CARE_PROVIDER_SITE_OTHER): Payer: Self-pay

## 2023-05-12 DIAGNOSIS — J309 Allergic rhinitis, unspecified: Secondary | ICD-10-CM

## 2023-05-13 DIAGNOSIS — R200 Anesthesia of skin: Secondary | ICD-10-CM | POA: Insufficient documentation

## 2023-05-16 ENCOUNTER — Ambulatory Visit (HOSPITAL_COMMUNITY): Admission: EM | Admit: 2023-05-16 | Discharge: 2023-05-16 | Disposition: A | Discharge status: Home / Self Care

## 2023-05-16 ENCOUNTER — Encounter (HOSPITAL_COMMUNITY): Payer: Self-pay

## 2023-05-16 DIAGNOSIS — J069 Acute upper respiratory infection, unspecified: Secondary | ICD-10-CM

## 2023-05-16 LAB — POC COVID19/FLU A&B COMBO
Covid Antigen, POC: NEGATIVE
Influenza A Antigen, POC: NEGATIVE
Influenza B Antigen, POC: NEGATIVE

## 2023-05-16 MED ORDER — BENZONATATE 200 MG PO CAPS
200.0000 mg | ORAL_CAPSULE | Freq: Three times a day (TID) | ORAL | 0 refills | Status: DC | PRN
Start: 2023-05-16 — End: 2023-08-12

## 2023-05-16 MED ORDER — ONDANSETRON 4 MG PO TBDP
ORAL_TABLET | ORAL | Status: AC
  Filled 2023-05-16: qty 1

## 2023-05-16 MED ORDER — ONDANSETRON 4 MG PO TBDP
4.0000 mg | ORAL_TABLET | Freq: Once | ORAL | Status: AC
  Administered 2023-05-16: 4 mg via ORAL

## 2023-05-16 MED ORDER — ONDANSETRON 4 MG PO TBDP: 4.0000 mg | ORAL_TABLET | Freq: Three times a day (TID) | ORAL | 0 refills | Status: DC | PRN

## 2023-05-16 MED ORDER — AZELASTINE HCL 0.1 % NA SOLN: 1.0000 | Freq: Two times a day (BID) | NASAL | 1 refills | Status: AC

## 2023-05-16 NOTE — ED Triage Notes (Addendum)
 Patient here today with c/o nasal congestion, cough, sneeze, hot flashes, headache, and itchy-dry throat X 1 week. She has been taking an allergy pill and Tylenol with no relief. 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 Patient also states that she is feeling nauseous.

## 2023-05-16 NOTE — ED Provider Notes (Signed)
 [facility] [place]  Note:  This document was prepared using Dragon voice recognition software and may include unintentional dictation errors.  MRN: [id number] DOB: [birth year]  Subjective:   Sherri [name] is a 38 y.o. female presenting for nausea, cough, nasal congestion, body aches, chills, hot flashes, dry throat, headache x 1 week.  Patient denies any known sick contacts.  Reports she is taking daily allergy medication and Tylenol without relief.  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  No chest pain, shortness of breath, wheezing, dizziness.  No current facility-administered medications for this encounter.  Current Outpatient Medications:    azelastine (ASTELIN) 0.1 % nasal spray, Place 1 spray into both nostrils 2 (two) times daily. Use in each nostril as directed, Disp: 30 mL, Rfl: 1   benzonatate (TESSALON) 200 MG capsule, Take 1 capsule (200 mg total) by mouth 3 (three) times daily as needed for cough., Disp: 20 capsule, Rfl: 0   ondansetron (ZOFRAN-ODT) 4 MG disintegrating tablet, Take 1 tablet (4 mg total) by mouth every 8 (eight) hours as needed for nausea or vomiting., Disp: 20 tablet, Rfl: 0   amitriptyline (ELAVIL) 50 MG tablet, Take 50 mg by mouth at bedtime., Disp: , Rfl:    amLODipine (NORVASC) 5 MG tablet, Take 1 tablet by mouth daily., Disp: , Rfl:    Blood Pressure Monitoring (BLOOD PRESSURE KIT) KIT, Use to check blood pressure twice daily and records BP logs nd bring to your office visits, Disp: , Rfl:    Cholecalciferol (VITAMIN D3) 125 MCG (5000 UT) CAPS, Take 1 capsule by mouth daily., Disp: , Rfl:    clindamycin (CLEOCIN T) 1 % lotion, Apply to cleansed face daily in the morning., Disp: 60 mL, Rfl: 1   Drospirenone (SLYND) 4 MG TABS, Take 1 tablet (4 mg total) by mouth daily., Disp: 84 tablet, Rfl: 4   EPINEPHrine (EPIPEN 2-PAK) 0.3 mg/0.3 mL IJ SOAJ injection, Inject 0.3 mg into the muscle as  needed for anaphylaxis., Disp: 2 each, Rfl: 1   famotidine (PEPCID) 40 MG tablet, Take 1 tablet (40 mg total) by mouth at bedtime., Disp: 90 tablet, Rfl: 1   Ferrous Sulfate (IRON) 28 MG TABS, Take 1 tablet by mouth daily., Disp: , Rfl:    fluticasone (FLONASE) 50 MCG/ACT nasal spray, Place 1 spray into both nostrils daily., Disp: , Rfl:    fosfomycin (MONUROL) 3 g PACK, SMARTSIG:3 Gram(s) By Mouth Once, Disp: , Rfl:    gemfibrozil (LOPID) 600 MG tablet, Take by mouth., Disp: , Rfl:    hydrOXYzine (ATARAX) 25 MG tablet, TAKE 1 TABLET BY MOUTH THREE TIMES A DAY AS NEEDED FOR ANXIETY FOR UP TO 90 DAYS, Disp: , Rfl:    ibuprofen (ADVIL) 600 MG tablet, Take 1 tablet (600 mg total) by mouth every 6 (six) hours as needed for headache, mild pain (pain score 1-3), moderate pain (pain score 4-6) or cramping., Disp: 30 tablet, Rfl: 2   lisinopril (ZESTRIL) 10 MG tablet, Take 10 mg by mouth daily., Disp: , Rfl:    Na Sulfate-K Sulfate-Mg Sulf 17.5-3.13-1.6 GM/177ML SOLN, See admin instructions., Disp: , Rfl:    omeprazole (PRILOSEC) 40 MG capsule, Take 1 capsule (40 mg total) by mouth every morning., Disp: 90 capsule, Rfl: 1   Vitamin D, Ergocalciferol, (DRISDOL) 1.25 MG (50000 UNIT) CAPS capsule, Take by mouth., Disp: , Rfl:    Allergies  Allergen Reactions   Macrobid [  Nitrofurantoin] Nausea And Vomiting   Augmentin [Amoxicillin-Pot Clavulanate]     Pt states it makes her sick   Cefdinir Itching   Metformin And Related Nausea And Vomiting    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    Urinary tract infection    Uterine fibroid      Past Surgical History:  Procedure Laterality Date   CESAREAN SECTION N/A 06/05/2014   Procedure: CESAREAN SECTION;  Surgeon: Levie Heritage, DO;  Location: [facility];  Service:  Obstetrics;  Laterality: N/A;   WISDOM TOOTH EXTRACTION      Family History  Problem Relation Age of Onset   Hypertension Mother    Hypertension Father    Breast cancer Maternal Grandmother        63   Colon cancer Neg Hx    Colon polyps Neg Hx    Kidney disease Neg Hx    Diabetes Neg Hx    Esophageal cancer Neg Hx    Gallbladder disease Neg Hx    Heart disease Neg Hx    Asthma Neg Hx    Stroke Neg Hx    Stomach cancer Neg Hx     Social History   Tobacco Use   Smoking status: Never    Passive exposure: Never   Smokeless tobacco: Never  Vaping Use   Vaping status: Never Used  Substance Use Topics   Alcohol use: No   Drug use: Never    ROS Refer to HPI for ROS details.  Objective:   Vitals: BP (!) 152/87 (BP Location: Left Arm)   Pulse 80   Temp 98.5 F (36.9 C) (Oral)   Resp 16   Ht 4\' 11"  (1.499 m)   Wt 193 lb (87.5 kg)   LMP 04/27/2023 (Exact Date)   SpO2 97%   BMI 38.98 kg/m   Physical Exam Vitals and nursing note reviewed.  Constitutional:      General: She is not in acute distress.    Appearance: Normal appearance. She is well-developed. She is not ill-appearing or toxic-appearing.  HENT:     Head: Normocephalic.     Nose: Congestion present. No rhinorrhea.     Mouth/Throat:     Mouth: Mucous membranes are moist.     Pharynx: Oropharynx is clear. No posterior oropharyngeal erythema.  Eyes:     Extraocular Movements: Extraocular movements intact.     Conjunctiva/sclera: Conjunctivae normal.  Cardiovascular:     Rate and Rhythm: Normal rate and regular rhythm.     Heart sounds: No murmur heard. Pulmonary:     Effort: Pulmonary effort is normal. No respiratory distress.     Breath sounds: Normal breath sounds. No stridor. No wheezing, rhonchi or rales.  Musculoskeletal:     Cervical back: Neck supple.  Skin:    General: Skin is warm and dry.  Neurological:     General: No focal deficit present.     Mental Status: She is alert and  oriented to person, place, and time.  Psychiatric:        Mood and Affect: Mood normal.     Procedures  Results for orders placed or performed during the hospital encounter of 05/16/23 (from the past 24 hours)  POC Covid19/Flu A&B Antigen     Status: None  Collection Time: 05/16/23  1:32 PM  Result Value Ref Range   Influenza A Antigen, POC Negative Negative   Influenza B Antigen, POC Negative Negative   Covid Antigen, POC Negative Negative   *Note: Due to a large number of results and/or encounters for the requested time period, some results have not been displayed. A complete set of results can be found in Results Review.    Assessment and Plan :   PDMP not reviewed this encounter.  1. Viral URI with cough    1. Viral URI with cough (Primary) - POC Covid19/Flu A&B Antigen completed in UC and is negative for COVID and influenza - azelastine (ASTELIN) 0.1 % nasal spray; Place 1 spray into both nostrils 2 (two) times daily. Use in each nostril as directed for nasal congestion and postnasal drip - benzonatate (TESSALON) 200 MG capsule; Take 1 capsule (200 mg total) by mouth 3 (three) times daily as needed for cough suppression.  Medication is most effective at nighttime. -Ondansetron 4 mg disintegrating tablet every 8 hours as needed for nausea and vomiting symptoms secondary to viral illness. -Continue to monitor any symptoms for change in severity, if any escalation of current symptoms or development of new symptoms follow-up for further evaluation management  Tonny Bollman, Truman Aceituno B, NP 05/16/23 1342

## 2023-05-16 NOTE — Discharge Instructions (Addendum)
 1. Viral URI with cough (Primary) - POC Covid19/Flu A&B Antigen completed in UC and is negative for COVID and influenza - azelastine (ASTELIN) 0.1 % nasal spray; Place 1 spray into both nostrils 2 (two) times daily. Use in each nostril as directed for nasal congestion and postnasal drip - benzonatate (TESSALON) 200 MG capsule; Take 1 capsule (200 mg total) by mouth 3 (three) times daily as needed for cough suppression.  Medication is most effective at nighttime. -Ondansetron 4 mg disintegrating tablet every 8 hours as needed for nausea and vomiting symptoms secondary to viral illness. -Continue to monitor any symptoms for change in severity, if any escalation of current symptoms or development of new symptoms follow-up for further evaluation management

## 2023-05-24 ENCOUNTER — Ambulatory Visit: Admitting: Obstetrics and Gynecology

## 2023-05-24 ENCOUNTER — Ambulatory Visit: Payer: Medicaid Other | Admitting: Obstetrics and Gynecology

## 2023-05-26 ENCOUNTER — Ambulatory Visit (HOSPITAL_COMMUNITY)
Admission: EM | Admit: 2023-05-26 | Discharge: 2023-05-26 | Disposition: A | Discharge status: Home / Self Care | Attending: Family Medicine | Admitting: Family Medicine

## 2023-05-26 ENCOUNTER — Encounter (HOSPITAL_COMMUNITY): Payer: Self-pay

## 2023-05-26 DIAGNOSIS — N898 Other specified noninflammatory disorders of vagina: Secondary | ICD-10-CM | POA: Diagnosis present

## 2023-05-26 DIAGNOSIS — R300 Dysuria: Secondary | ICD-10-CM | POA: Insufficient documentation

## 2023-05-26 DIAGNOSIS — R519 Headache, unspecified: Secondary | ICD-10-CM | POA: Insufficient documentation

## 2023-05-26 LAB — POCT URINALYSIS DIP (MANUAL ENTRY)
Bilirubin, UA: NEGATIVE
Glucose, UA: NEGATIVE mg/dL
Ketones, POC UA: NEGATIVE mg/dL
Leukocytes, UA: NEGATIVE
Nitrite, UA: NEGATIVE
Protein Ur, POC: NEGATIVE mg/dL
Spec Grav, UA: <=1.005 — AB (ref 1.010–1.025)
Urobilinogen, UA: 0.2 U/dL
pH, UA: 5 (ref 5.0–8.0)

## 2023-05-26 MED ORDER — FLUCONAZOLE 150 MG PO TABS: 150.0000 mg | ORAL_TABLET | Freq: Every day | ORAL | 0 refills | Status: AC

## 2023-05-26 NOTE — ED Triage Notes (Signed)
 Pt reports burning and itching in her vaginal area x 3 days. Patient states a headache x 1 week. Patient has not taking any medication to help.

## 2023-05-26 NOTE — ED Provider Notes (Signed)
 [facility]    CSN: [id number] Arrival date & time: 05/26/23  1618      History   Chief Complaint Chief Complaint  Patient presents with   Vaginal Discharge   Headache    HPI Sherri [name] is a 38 y.o. female.   Patient presents requesting evaluation for urinary urgency and burning that has been ongoing for approximately 1 week.  Patient states that she has also been on her menstrual cycle for the past 5 days.  Her menstrual flow has been about the same as normal and she has experienced mild cramping.  Patient endorses the onset of a generalized headache over the past day or so.  She has taken Tylenol without any alleviation of the symptoms.  Patient states she has some mild back pain and some white abnormal discharge.  She is sexually active with 1 female partner-they do not use protection.  She is unsure if she is at risk for sexually transmitted infections.  No reported fever, sore throat, runny nose, cough, chest pain, shortness of breath, abdominal pain, vomiting, or diarrhea.  The history is provided by the patient.  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    Urinary tract infection    Uterine fibroid     Patient Active Problem List   Diagnosis Date Noted   Moderate episode of recurrent major depressive disorder (HCC) 07/16/2022   Pre-diabetes 07/23/2021   Anxiety and depression 06/24/2021   Fibromyalgia 06/24/2021   Chronic sinusitis 12/26/2020   Sinus pressure 12/26/2020   Prediabetes 07/05/2020   Gastroesophageal reflux disease 04/26/2020   Perianal rash 03/26/2020   Chronic nausea 03/26/2020   Chronic constipation 03/09/2019   Pelvic  pain in female 03/09/2019   Hypertension    Genital herpes    Uterine fibroid 12/25/2013   Obesity 12/25/2013   Bacterial vaginitis 04/27/2012    Past Surgical History:  Procedure Laterality Date   CESAREAN SECTION N/A 06/05/2014   Procedure: CESAREAN SECTION;  Surgeon: Levie Heritage, DO;  Location: [facility];  Service: Obstetrics;  Laterality: N/A;   WISDOM TOOTH EXTRACTION      OB History     Gravida  1   Para  1   Term      Preterm  1   AB      Living  2      SAB      IAB      Ectopic      Multiple  1   Live Births  2            Home Medications    Prior to Admission medications   Medication Sig Start Date End Date Taking? Authorizing Provider  amitriptyline (ELAVIL) 50 MG tablet Take 50 mg by mouth at bedtime. 06/19/22  Yes [provider]  amLODipine (NORVASC) 5 MG tablet Take 1 tablet by mouth daily. 08/11/22  Yes [provider]  azelastine (ASTELIN) 0.1 % nasal spray Place 1 spray into both nostrils 2 (two) times daily. Use in each nostril as directed 05/16/23  Yes Reddick, Johnathan B, NP  benzonatate (TESSALON) 200 MG capsule Take 1 capsule (200  mg total) by mouth 3 (three) times daily as needed for cough. 05/16/23  Yes Reddick, Nicola Girt B, NP  Blood Pressure Monitoring (BLOOD PRESSURE KIT) KIT Use to check blood pressure twice daily and records BP logs nd bring to your office visits 08/03/22  Yes [provider]  Cholecalciferol (VITAMIN D3) 125 MCG (5000 UT) CAPS Take 1 capsule by mouth daily.   Yes [provider]  Drospirenone (SLYND) 4 MG TABS Take 1 tablet (4 mg total) by mouth daily. 04/15/23  Yes Constant, Peggy, MD  EPINEPHrine (EPIPEN 2-PAK) 0.3 mg/0.3 mL IJ SOAJ injection Inject 0.3 mg into the muscle as needed for anaphylaxis. 12/31/22  Yes Padgett, Pilar Grammes, MD  famotidine (PEPCID) 40 MG tablet Take 1 tablet (40 mg total) by mouth at bedtime. 02/18/23  Yes Padgett, Pilar Grammes, MD  Ferrous  Sulfate (IRON) 28 MG TABS Take 1 tablet by mouth daily.   Yes [provider]  fluconazole (DIFLUCAN) 150 MG tablet Take 1 tablet (150 mg total) by mouth daily for 1 day. May repeat in 3 days if needed. 05/26/23 05/27/23 Yes Burgess Estelle, FNP  fosfomycin (MONUROL) 3 g PACK SMARTSIG:3 Gram(s) By Mouth Once 06/16/22  Yes [provider]  gemfibrozil (LOPID) 600 MG tablet Take by mouth. 06/01/22  Yes [provider]  hydrOXYzine (ATARAX) 25 MG tablet TAKE 1 TABLET BY MOUTH THREE TIMES A DAY AS NEEDED FOR ANXIETY FOR UP TO 90 DAYS 08/11/22  Yes [provider]  ibuprofen (ADVIL) 600 MG tablet Take 1 tablet (600 mg total) by mouth every 6 (six) hours as needed for headache, mild pain (pain score 1-3), moderate pain (pain score 4-6) or cramping. 03/11/23  Yes Warden Fillers, MD  lisinopril (ZESTRIL) 10 MG tablet Take 10 mg by mouth daily. 04/22/23 07/21/23 Yes [provider]  Na Sulfate-K Sulfate-Mg Sulf 17.5-3.13-1.6 GM/177ML SOLN See admin instructions. 04/10/22  Yes [provider]  omeprazole (PRILOSEC) 40 MG capsule Take 1 capsule (40 mg total) by mouth every morning. 02/18/23  Yes Padgett, Pilar Grammes, MD  ondansetron (ZOFRAN-ODT) 4 MG disintegrating tablet Take 1 tablet (4 mg total) by mouth every 8 (eight) hours as needed for nausea or vomiting. 05/16/23  Yes Reddick, Johnathan B, NP  Vitamin D, Ergocalciferol, (DRISDOL) 1.25 MG (50000 UNIT) CAPS capsule Take by mouth. 07/07/22  Yes [provider]  clindamycin (CLEOCIN T) 1 % lotion Apply to cleansed face daily in the morning. 06/30/21     fluticasone (FLONASE) 50 MCG/ACT nasal spray Place 1 spray into both nostrils daily. 04/20/23   [provider]    Family History Family History  Problem Relation Age of Onset   Hypertension Mother    Hypertension Father    Breast cancer Maternal Grandmother        40   Colon cancer Neg Hx    Colon polyps Neg Hx    Kidney disease Neg Hx     Diabetes Neg Hx    Esophageal cancer Neg Hx    Gallbladder disease Neg Hx    Heart disease Neg Hx    Asthma Neg Hx    Stroke Neg Hx    Stomach cancer Neg Hx     Social History Social History   Tobacco Use   Smoking status: Never    Passive exposure: Never   Smokeless tobacco: Never  Vaping Use   Vaping status: Never Used  Substance Use Topics   Alcohol use: No   Drug use:  Never     Allergies   Macrobid [nitrofurantoin], Augmentin [amoxicillin-pot clavulanate], Cefdinir, and Metformin and related   Review of Systems Review of Systems  Constitutional:  Negative for chills and fever.  HENT: Negative.    Eyes: Negative.   Respiratory: Negative.    Cardiovascular: Negative.   Genitourinary:  Positive for dysuria, urgency and vaginal discharge.  Musculoskeletal: Negative.   Skin: Negative.   Neurological:  Positive for headaches.  Psychiatric/Behavioral: Negative.       Physical Exam Triage Vital Signs ED Triage Vitals [05/26/23 1754]  Encounter Vitals Group     BP (!) 140/87     Systolic BP Percentile      Diastolic BP Percentile      Pulse Rate 71     Resp 16     Temp 98.3 F (36.8 C)     Temp Source Oral     SpO2 98 %     Weight      Height      Head Circumference      Peak Flow      Pain Score      Pain Loc      Pain Education      Exclude from Growth Chart    No data found.  Updated Vital Signs BP (!) 140/87 (BP Location: Left Arm)   Pulse 71   Temp 98.3 F (36.8 C) (Oral)   Resp 16   LMP 04/27/2023 (Exact Date)   SpO2 98%   Physical Exam Constitutional:      Appearance: She is well-developed. She is obese.  HENT:     Head: Normocephalic.  Cardiovascular:     Rate and Rhythm: Normal rate and regular rhythm.  Pulmonary:     Effort: Pulmonary effort is normal.     Breath sounds: Normal breath sounds.  Abdominal:     Palpations: Abdomen is soft.     Tenderness: There is no abdominal tenderness.  Musculoskeletal:     Lumbar back:  Tenderness present.     Comments: Mild low back discomfort on percussion.  Neurological:     Mental Status: She is alert and oriented to person, place, and time.     UC Treatments / Results  Labs (all labs ordered are listed, but only abnormal results are displayed) Labs Reviewed  POCT URINALYSIS DIP (MANUAL ENTRY) - Abnormal; Notable for the following components:      Result Value   Spec Grav, UA <=1.005 (*)    Blood, UA trace-intact (*)    All other components within normal limits  CERVICOVAGINAL ANCILLARY ONLY    EKG   Radiology No results found.  Procedures Procedures (including critical care time)  Medications Ordered in UC Medications - No data to display  Initial Impression / Assessment and Plan / UC Course  I have reviewed the triage vital signs and the nursing notes.  Pertinent labs & imaging results that were available during my care of the patient were reviewed by me and considered in my medical decision making (see chart for details).     Patient presents for evaluation for a week history of urinary urgency and burning.  POCT UA did not demonstrate any concerns for infection.  She is also experiencing white vaginal discharge along with itching.  Patient has a history of prediabetes and was treated approximately 4 weeks ago with a short low dose course of prednisone.  A prescription for Diflucan has been provided.  Pending vaginitis swab/STI testing (patient unable  to confirm that she is not at risk and is having symptoms).  Discussed ensuring adequate oral hydration-Red flags were reviewed which would warrant return evaluation.  For her headache, I discussed the use of Tylenol and ibuprofen in adequate dosing along with caffeine intake.  Her physical examination was overall reassuring and she was discharged in stable condition.  Final Clinical Impressions(s) / UC Diagnoses   Final diagnoses:  Vaginal discharge  Bad headache  Dysuria     Discharge  Instructions      Pending vaginitis screen and STI testing - if we need to change your plan of care we will notify you.  A prescription for Diflucan has been sent to the pharmacy in the event that you have a yeast vaginal infection.  Increase oral hydration.  For your headache, would recommend Tylenol 1000 mg and ibuprofen 800 mg alternated every 4 hours throughout the day over the next 1 to 2 days.  You may also drink some caffeine which has been known to help alleviate those symptoms as well.  For worsening symptoms, development of fever, abdominal pain, vomiting, or diarrhea-please see your primary care provider for reevaluation.   ED Prescriptions     Medication Sig Dispense Auth. Provider   fluconazole (DIFLUCAN) 150 MG tablet Take 1 tablet (150 mg total) by mouth daily for 1 day. May repeat in 3 days if needed. 2 tablet Burgess Estelle, FNP      PDMP not reviewed this encounter.   Burgess Estelle, FNP 05/26/23 Izell Erin

## 2023-05-26 NOTE — Discharge Instructions (Addendum)
 Pending vaginitis screen and STI testing - if we need to change your plan of care we will notify you.  A prescription for Diflucan has been sent to the pharmacy in the event that you have a yeast vaginal infection.  Increase oral hydration.  For your headache, would recommend Tylenol 1000 mg and ibuprofen 800 mg alternated every 4 hours throughout the day over the next 1 to 2 days.  You may also drink some caffeine which has been known to help alleviate those symptoms as well.  For worsening symptoms, development of fever, abdominal pain, vomiting, or diarrhea-please see your primary care provider for reevaluation.

## 2023-05-27 LAB — CERVICOVAGINAL ANCILLARY ONLY
Bacterial Vaginitis (gardnerella): POSITIVE — AB
Candida Glabrata: NEGATIVE
Candida Vaginitis: NEGATIVE
Chlamydia: NEGATIVE
Comment: NEGATIVE
Comment: NEGATIVE
Comment: NEGATIVE
Comment: NEGATIVE
Comment: NEGATIVE
Comment: NORMAL
Neisseria Gonorrhea: NEGATIVE
Trichomonas: NEGATIVE

## 2023-05-28 ENCOUNTER — Telehealth (HOSPITAL_COMMUNITY): Payer: Self-pay | Admitting: *Deleted

## 2023-05-28 MED ORDER — ONDANSETRON 4 MG PO TBDP: 4.0000 mg | ORAL_TABLET | Freq: Three times a day (TID) | ORAL | 0 refills | Status: DC | PRN

## 2023-05-28 MED ORDER — METRONIDAZOLE 0.75 % VA GEL: 1.0000 | Freq: Every day | VAGINAL | 0 refills | Status: AC

## 2023-05-28 NOTE — Telephone Encounter (Signed)
 Pt states she has a lot of nausea after taking the diflucan this morning and she even vomited. She would like to know if she can get a couple Zofran. Advised pt to not take other dose since she isnt positive for yeast infection. She is positive for BV and would like treatment with metrogel since she can't take oral flagyl due to nausea.

## 2023-05-31 ENCOUNTER — Telehealth: Payer: Self-pay | Admitting: Podiatry

## 2023-05-31 ENCOUNTER — Encounter: Payer: Self-pay | Admitting: Podiatry

## 2023-05-31 NOTE — Telephone Encounter (Signed)
 Pt called and is having surgery on 4/4 and 5/30 and is needing a letter for her landlord to let them know about how long she is going to be out of work.

## 2023-05-31 NOTE — Progress Notes (Unsigned)
 NEUROLOGY FOLLOW UP OFFICE NOTE  Sherri [name] [id number]  Assessment/Plan:   Chronic migraine without aura, without status migrainosus, not intractable  Migraine prevention:  start topiramate 25mg  at bedtime.  Side effects discussed including taking measures not to get pregnant. Migraine rescue:  Increase sumatriptan to 100mg .  Zofran ODT 4mg . Limit use of pain relievers to no more than 2 days out of week to prevent risk of rebound or medication-overuse headache. Keep headache diary Follow up 4 to 5 months.    Subjective:  Sherri [name] is a 38 year old female with DM II and iron-deficiency anemia who follows up for migraines.  UPDATE: Intensity:  *** Duration:  *** Frequency:  *** Frequency of abortive medication: *** Current NSAIDS/analgesics:  none Current triptans:  sumatriptan 25mg  (ineffective) Current ergotamine:  none Current anti-emetic:  Zofran ODT 4mg  Current muscle relaxants:  none Current Antihypertensive medications:  lisinopril, amlodipine Current Antidepressant medications:  amitriptyline 50mg  QHS (to help sleep) Current Anticonvulsant medications:  none Current anti-CGRP:  none Current Antihistamines/Decongestants:  Flonase, Allegra, Sudafed Other therapy:  none Birth control:  none   Caffeine:  No coffee or colas Diet:  Sometimes ginger ale.  Trying to increase water intake.  Eats fast food.   Exercise:  no Depression:  no; Anxiety:  yes Sleep hygiene:  good with amitriptyline.  Gets 6-7 hours.  Wakes up tired.  She has been told that she snores in the past.    HISTORY: Started having headaches starting around September 2023.  No prior history.  No known preceding trigger (illness, head injury, new medication) except for possibly stress.  They are moderate but can be severe.  It is a pounding pain across the forehead.  No neck pain.  Associated with nausea, photophobia, phonophobia, osmophobia.  No associated vomiting, visual disturbance or  unilateral numbness or weakness.  No preceding aura or prodrome.  Often wakes up with headache.  They often last all day.  They occur every other day.  Usually will take a pain reliever no more than twice a week.    They were frequent in September and October, requiring 2 ED/Urgent Care visits.  CT head on 11/30/2021 personally reviewed was normal.    She also has chronic abdominal pain with nausea, vomiting and constipation.  GI workup has been negative.    Past NSAIDS/analgesics:  acetaminophen, Excedrin, ibuprofen, diclofenac, naproxen Past abortive triptans:  sumatriptan tab Past abortive ergotamine:  none Past muscle relaxants:  Flexeril, Robaxin Past anti-emetic:  promethazine Past antihypertensive medications:  none Past antidepressant medications:  duloxetine, mirtazapine Past anticonvulsant medications:  topiramate Past anti-CGRP:  none Past vitamins/Herbal/Supplements:  none Past antihistamines/decongestants:  Zyrtec Other past therapies:  none   Family history of headache:  no. No family history of cerebral aneurysms.    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     amLODipine (NORVASC) 5 MG tablet Take 1 tablet by mouth daily.     azelastine (ASTELIN) 0.1 % nasal spray Place 1 spray into  both nostrils 2 (two) times daily. Use in each nostril as directed 30 mL 1   benzonatate (TESSALON) 200 MG capsule Take 1 capsule (200 mg total) by mouth 3 (three) times daily as needed for cough. 20 capsule 0   Blood Pressure Monitoring (BLOOD PRESSURE KIT) KIT  Use to check blood pressure twice daily and records BP logs nd bring to your office visits     Cholecalciferol (VITAMIN D3) 125 MCG (5000 UT) CAPS Take 1 capsule by mouth daily.     clindamycin (CLEOCIN T) 1 % lotion Apply to cleansed face daily in the morning. 60 mL 1   Drospirenone (SLYND) 4 MG TABS Take 1 tablet (4 mg total) by mouth daily. 84 tablet 4   EPINEPHrine (EPIPEN 2-PAK) 0.3 mg/0.3 mL IJ SOAJ injection Inject 0.3 mg into the muscle as needed for anaphylaxis. 2 each 1   famotidine (PEPCID) 40 MG tablet Take 1 tablet (40 mg total) by mouth at bedtime. 90 tablet 1   Ferrous Sulfate (IRON) 28 MG TABS Take 1 tablet by mouth daily.     fluticasone (FLONASE) 50 MCG/ACT nasal spray Place 1 spray into both nostrils daily.     fosfomycin (MONUROL) 3 g PACK SMARTSIG:3 Gram(s) By Mouth Once     gemfibrozil (LOPID) 600 MG tablet Take by mouth.     hydrOXYzine (ATARAX) 25 MG tablet TAKE 1 TABLET BY MOUTH THREE TIMES A DAY AS NEEDED FOR ANXIETY FOR UP TO 90 DAYS     ibuprofen (ADVIL) 600 MG tablet Take 1 tablet (600 mg total) by mouth every 6 (six) hours as needed for headache, mild pain (pain score 1-3), moderate pain (pain score 4-6) or cramping. 30 tablet 2   lisinopril (ZESTRIL) 10 MG tablet Take 10 mg by mouth daily.     metroNIDAZOLE (METROGEL) 0.75 % vaginal gel Place 1 Applicatorful vaginally at bedtime for 5 days. 50 g 0   Na Sulfate-K Sulfate-Mg Sulf 17.5-3.13-1.6 GM/177ML SOLN See admin instructions.     omeprazole (PRILOSEC) 40 MG capsule Take 1 capsule (40 mg total) by mouth every morning. 90 capsule 1   ondansetron (ZOFRAN-ODT) 4 MG disintegrating tablet Take 1 tablet (4 mg total) by mouth every 8 (eight) hours as needed for nausea or vomiting. 5 tablet 0   Vitamin D, Ergocalciferol, (DRISDOL) 1.25 MG (50000 UNIT) CAPS capsule Take by mouth.     No current facility-administered medications on file prior to visit.    ALLERGIES: Allergies  Allergen Reactions   Macrobid  [Nitrofurantoin] Nausea And Vomiting   Augmentin [Amoxicillin-Pot Clavulanate]     Pt states it makes her sick   Cefdinir Itching   Metformin And Related Nausea And Vomiting    FAMILY HISTORY: Family History  Problem Relation Age of Onset   Hypertension Mother    Hypertension Father    Breast cancer Maternal Grandmother        80   Colon cancer Neg Hx    Colon polyps Neg Hx    Kidney disease Neg Hx    Diabetes Neg Hx    Esophageal cancer Neg Hx    Gallbladder disease Neg Hx    Heart disease Neg Hx    Asthma Neg Hx    Stroke Neg Hx    Stomach cancer Neg Hx       Objective:  *** General: No acute distress.  Patient appears ***-groomed.   Head:  Normocephalic/atraumatic Eyes:  Fundi examined but not visualized Neck: supple, no paraspinal tenderness,  full range of motion Heart:  Regular rate and rhythm Lungs:  Clear to auscultation bilaterally Back: No paraspinal tenderness Neurological Exam: alert and oriented.  Speech fluent and not dysarthric, language intact.  CN II-XII intact. Bulk and tone normal, muscle strength 5/5 throughout.  Sensation to light touch intact.  Deep tendon reflexes 2+ throughout, toes downgoing.  Finger to nose testing intact.  Gait normal, Romberg negative.   Shon Millet, DO  CC: ***

## 2023-06-01 ENCOUNTER — Ambulatory Visit: Payer: Medicaid Other | Admitting: Neurology

## 2023-06-01 ENCOUNTER — Telehealth: Payer: Self-pay | Admitting: Pharmacist

## 2023-06-01 ENCOUNTER — Encounter: Payer: Self-pay | Admitting: Neurology

## 2023-06-01 ENCOUNTER — Encounter: Payer: Self-pay | Admitting: Podiatry

## 2023-06-01 VITALS — BP 135/87 | Ht 59.0 in | Wt 190.0 lb

## 2023-06-01 DIAGNOSIS — G43709 Chronic migraine without aura, not intractable, without status migrainosus: Secondary | ICD-10-CM

## 2023-06-01 MED ORDER — ONDANSETRON 4 MG PO TBDP: 4.0000 mg | ORAL_TABLET | Freq: Three times a day (TID) | ORAL | 5 refills | Status: DC | PRN

## 2023-06-01 MED ORDER — RIZATRIPTAN BENZOATE 10 MG PO TABS: 10.0000 mg | ORAL_TABLET | ORAL | 5 refills | Status: DC | PRN

## 2023-06-01 MED ORDER — EMGALITY 120 MG/ML ~~LOC~~ SOAJ: 240.0000 mg | Freq: Once | SUBCUTANEOUS | 0 refills | Status: AC

## 2023-06-01 NOTE — Patient Instructions (Signed)
  Start Emgality - 2 injections first dose, then 1 injection every 30 days thereafter.  Make sure first prescription has 2 injectors.  Then let me know and I will send in standing order.  Contact us in 3 months on the medication with update Take rizatriptan at earliest onset of headache.  May repeat dose once in 2 hours if needed.  Maximum 2 tablets in 24 hours.  Stop ibuprofen Zofran for nausea. Limit use of pain relievers to no more than 2 days out of the week.  These medications include acetaminophen, NSAIDs (ibuprofen/Advil/Motrin, naproxen/Aleve, triptans (Imitrex/sumatriptan), Excedrin, and narcotics.  This will help reduce risk of rebound headaches. Be aware of common food triggers Routine exercise Stay adequately hydrated (aim for 64 oz water daily) Keep headache diary Maintain proper stress management Maintain proper sleep hygiene Do not skip meals Consider supplements:  magnesium citrate 400mg  daily, riboflavin 400mg  daily, coenzyme Q10 300mg  daily

## 2023-06-01 NOTE — Telephone Encounter (Signed)
 Pharmacy Patient Advocate Encounter   Received notification from Patient Pharmacy that prior authorization for Emgality 120MG /ML auto-injectors (migraine) is required/requested.   Insurance verification completed.   The patient is insured through [facility] .   Per test claim: PA required; PA submitted to above mentioned insurance via CoverMyMeds Key/confirmation #/EOC [id number] Status is pending

## 2023-06-01 NOTE — Telephone Encounter (Signed)
 Pharmacy Patient Advocate Encounter  Received notification from [facility] that Prior Authorization for Emgality 120MG /ML auto-injectors (migraine) has been APPROVED from 05/18/2023 to 06/302025   PA #/Case ID/Reference #: [id number]

## 2023-06-01 NOTE — Telephone Encounter (Signed)
 Notified pt it would be 6 wks per surgery and I have emailed the note to the patient

## 2023-06-02 ENCOUNTER — Other Ambulatory Visit (HOSPITAL_COMMUNITY): Payer: Self-pay

## 2023-06-02 ENCOUNTER — Ambulatory Visit (INDEPENDENT_AMBULATORY_CARE_PROVIDER_SITE_OTHER): Payer: Self-pay | Admitting: *Deleted

## 2023-06-02 DIAGNOSIS — J309 Allergic rhinitis, unspecified: Secondary | ICD-10-CM

## 2023-06-04 ENCOUNTER — Other Ambulatory Visit: Payer: Self-pay | Admitting: Podiatry

## 2023-06-04 DIAGNOSIS — M722 Plantar fascial fibromatosis: Secondary | ICD-10-CM | POA: Diagnosis not present

## 2023-06-04 DIAGNOSIS — M216X1 Other acquired deformities of right foot: Secondary | ICD-10-CM | POA: Diagnosis not present

## 2023-06-04 MED ORDER — IBUPROFEN 600 MG PO TABS: 600.0000 mg | ORAL_TABLET | Freq: Four times a day (QID) | ORAL | 0 refills | Status: DC | PRN

## 2023-06-04 MED ORDER — ACETAMINOPHEN 500 MG PO TABS: 1000.0000 mg | ORAL_TABLET | Freq: Four times a day (QID) | ORAL | 0 refills | Status: DC | PRN

## 2023-06-04 MED ORDER — OXYCODONE HCL 5 MG PO TABS: 5.0000 mg | ORAL_TABLET | ORAL | 0 refills | Status: AC | PRN

## 2023-06-04 MED ORDER — GABAPENTIN 300 MG PO CAPS: 300.0000 mg | ORAL_CAPSULE | Freq: Three times a day (TID) | ORAL | 0 refills | Status: DC

## 2023-06-07 ENCOUNTER — Other Ambulatory Visit: Payer: Self-pay | Admitting: Neurology

## 2023-06-07 ENCOUNTER — Telehealth: Payer: Self-pay

## 2023-06-07 ENCOUNTER — Telehealth: Payer: Self-pay | Admitting: Podiatry

## 2023-06-07 NOTE — Telephone Encounter (Signed)
 PA request received from covermymeds for oxycodone 5 mg table. PA submitted.  Sherri [name]  (Key: [id number]) PA Case ID #: [id number] Rx #: [id number]

## 2023-06-07 NOTE — Telephone Encounter (Signed)
 Patient called and wanted to know if her pain medication was sent to the [facility] pharmacy on [address]. Thank you.

## 2023-06-08 NOTE — Telephone Encounter (Signed)
 As rizatriptan is backordered, will change to eletriptan 40mg 

## 2023-06-08 NOTE — Telephone Encounter (Signed)
 PA approved 06/07/23-12/04/23

## 2023-06-10 ENCOUNTER — Ambulatory Visit (INDEPENDENT_AMBULATORY_CARE_PROVIDER_SITE_OTHER): Payer: Medicaid Other | Admitting: Podiatry

## 2023-06-10 ENCOUNTER — Ambulatory Visit: Admitting: Obstetrics and Gynecology

## 2023-06-10 ENCOUNTER — Telehealth: Payer: Self-pay

## 2023-06-10 ENCOUNTER — Encounter: Payer: Self-pay | Admitting: Podiatry

## 2023-06-10 DIAGNOSIS — M62462 Contracture of muscle, left lower leg: Secondary | ICD-10-CM

## 2023-06-10 DIAGNOSIS — M62461 Contracture of muscle, right lower leg: Secondary | ICD-10-CM

## 2023-06-10 DIAGNOSIS — M722 Plantar fascial fibromatosis: Secondary | ICD-10-CM

## 2023-06-10 DIAGNOSIS — R2689 Other abnormalities of gait and mobility: Secondary | ICD-10-CM

## 2023-06-10 MED ORDER — CYCLOBENZAPRINE HCL 10 MG PO TABS: 10.0000 mg | ORAL_TABLET | Freq: Three times a day (TID) | ORAL | 0 refills | Status: DC | PRN

## 2023-06-10 NOTE — Telephone Encounter (Signed)
*  [facility]  Pharmacy Patient Advocate Encounter   Received notification from CoverMyMeds that prior authorization for Eletriptan Hydrobromide 40MG  tablets  is required/requested.   Insurance verification completed.   The patient is insured through [facility] .   Per test claim: PA required; PA submitted to above mentioned insurance via CoverMyMeds Key/confirmation #/EOC  Status is pending

## 2023-06-11 ENCOUNTER — Other Ambulatory Visit (HOSPITAL_COMMUNITY): Payer: Self-pay

## 2023-06-11 ENCOUNTER — Other Ambulatory Visit: Payer: Self-pay | Admitting: Neurology

## 2023-06-11 MED ORDER — RIZATRIPTAN BENZOATE 10 MG PO TABS
10.0000 mg | ORAL_TABLET | ORAL | 5 refills | Status: AC | PRN
  Filled 2023-06-11: qty 10, 30d supply, fill #0

## 2023-06-11 NOTE — Telephone Encounter (Signed)
 I called and spoke with patient.  I explained to her that we contacted [facility] pharmacy and they told us that they aren't having difficulty getting rizatriptan.  Sent a prescription for rizatriptan to [facility].

## 2023-06-11 NOTE — Telephone Encounter (Signed)
 Pharmacy Patient Advocate Encounter  Received notification from [facility] that Prior Authorization for ELETRIPTAN  has been DENIED.  Full denial letter will be uploaded to the media tab. See denial reason below.   PA #/Case ID/Reference #:  [id number]

## 2023-06-12 ENCOUNTER — Other Ambulatory Visit (HOSPITAL_COMMUNITY): Payer: Self-pay

## 2023-06-12 ENCOUNTER — Other Ambulatory Visit (HOSPITAL_BASED_OUTPATIENT_CLINIC_OR_DEPARTMENT_OTHER): Payer: Self-pay

## 2023-06-13 NOTE — Progress Notes (Signed)
  Subjective:  Patient ID: Sherri [name], female    DOB: [birth year],  MRN: [id number]  Chief Complaint  Patient presents with   Post-op Problem    Patient states that she has been in a lot of pain and her right foot has soreness to it, patient states medication is not helping with pain     DOS: 06/04/2023 Procedure: Right EPF and endoscopic gastroc recession  38 y.o. female returns for post-op check.  Pain has been quite significant she just tried taking the pain medication feels Akin does not help but makes her nauseous and sleepy  Review of Systems: Negative except as noted in the HPI. Denies N/V/F/Ch.   Objective:  There were no vitals filed for this visit. There is no height or weight on file to calculate BMI. Constitutional Well developed. Well nourished.  Vascular Foot warm and well perfused. Capillary refill normal to all digits.  Calf is soft and supple, no posterior calf or knee pain, negative Homans' sign  Neurologic Normal speech. Oriented to person, place, and time. Epicritic sensation to light touch grossly present bilaterally.  Dermatologic Skin healing well without signs of infection. Skin edges well coapted without signs of infection.  Orthopedic: Tenderness to palpation noted about the surgical site.    Assessment:   1. Inability to bear weight   2. Bilateral plantar fasciitis   3. Gastrocnemius equinus of left lower extremity   4. Gastrocnemius equinus of right lower extremity    Plan:  Patient was evaluated and treated and all questions answered.  S/p foot surgery right - Okay to shower leg at this point.  Return in 2 weeks for suture removal.  Weightbearing as tolerated in cam boot.  She inquired about a knee scooter and I provided her an order for this although I discussed with her that Medicaid likely will not cover this at any supplier.  Having quite a bit of pain and cramping at the gastrocnemius recession site and I recommended a trial of  cyclobenzaprine.  Rx sent to pharmacy.  No follow-ups on file.

## 2023-06-15 ENCOUNTER — Telehealth: Payer: Self-pay | Admitting: Podiatry

## 2023-06-15 MED ORDER — ONDANSETRON HCL 4 MG PO TABS: 4.0000 mg | ORAL_TABLET | Freq: Three times a day (TID) | ORAL | 0 refills | Status: DC | PRN

## 2023-06-15 NOTE — Telephone Encounter (Signed)
 I called the patient and she has been feeling nauseated and sick to my stomach, a few headaches and some dizziness that lasted for a few minutes and she had to drink something and rest. Her last dose was around Saturday. She has been taking the Cyclobenzaprine and Oxycodone and has tried to take some sprite and crackers to help the stomach pain. She reports she did have a meal with her food.  Do you have any other recommendations?  Please advise?

## 2023-06-15 NOTE — Telephone Encounter (Signed)
 Patient stated due to medication she is having an allergic reaction and medication is not working for her. Patient contact telephone number, [phone]

## 2023-06-15 NOTE — Telephone Encounter (Signed)
 She is still having some sharp pain, and some numbness and tingling at times. She is still having pain without the medication and she tried the Zofran but was still having nausea. She has been using ice and elevating  the foot and no relief and she is not able to get around like she wants to at this time. She was like I have tried everything the doctor could recommend but I am having a lot of pain no matter what I do.   She is only been using Tylenol with no relief.  Please advise?

## 2023-06-15 NOTE — Telephone Encounter (Signed)
 Spoke with the patient by phone and advised to continue to ice and elevate she does not want to switch to a different medication such as hydrocodone acetaminophen because she feels like she is quite sensitive to medications.  Does not want to try another antiemetic.  Used to taking Zofran.  Advised ice 10 minutes on and 10 minutes off.  Sutures will be removed next week and hopefully should help.

## 2023-06-17 ENCOUNTER — Encounter (HOSPITAL_COMMUNITY): Payer: Self-pay

## 2023-06-17 ENCOUNTER — Other Ambulatory Visit: Payer: Self-pay

## 2023-06-17 ENCOUNTER — Emergency Department (HOSPITAL_COMMUNITY)
Admission: EM | Admit: 2023-06-17 | Discharge: 2023-06-18 | Disposition: A | Discharge status: Home / Self Care | Attending: Emergency Medicine | Admitting: Emergency Medicine

## 2023-06-17 ENCOUNTER — Ambulatory Visit: Admission: EM | Admit: 2023-06-17 | Discharge: 2023-06-17 | Disposition: A | Discharge status: Home / Self Care

## 2023-06-17 DIAGNOSIS — R1084 Generalized abdominal pain: Secondary | ICD-10-CM | POA: Diagnosis not present

## 2023-06-17 DIAGNOSIS — E119 Type 2 diabetes mellitus without complications: Secondary | ICD-10-CM | POA: Diagnosis not present

## 2023-06-17 DIAGNOSIS — R197 Diarrhea, unspecified: Secondary | ICD-10-CM | POA: Insufficient documentation

## 2023-06-17 DIAGNOSIS — Z79899 Other long term (current) drug therapy: Secondary | ICD-10-CM | POA: Insufficient documentation

## 2023-06-17 DIAGNOSIS — Z794 Long term (current) use of insulin: Secondary | ICD-10-CM | POA: Insufficient documentation

## 2023-06-17 DIAGNOSIS — R1030 Lower abdominal pain, unspecified: Secondary | ICD-10-CM | POA: Diagnosis not present

## 2023-06-17 DIAGNOSIS — I10 Essential (primary) hypertension: Secondary | ICD-10-CM | POA: Insufficient documentation

## 2023-06-17 DIAGNOSIS — R109 Unspecified abdominal pain: Secondary | ICD-10-CM

## 2023-06-17 DIAGNOSIS — K921 Melena: Secondary | ICD-10-CM

## 2023-06-17 DIAGNOSIS — Z7984 Long term (current) use of oral hypoglycemic drugs: Secondary | ICD-10-CM | POA: Insufficient documentation

## 2023-06-17 DIAGNOSIS — R112 Nausea with vomiting, unspecified: Secondary | ICD-10-CM | POA: Insufficient documentation

## 2023-06-17 LAB — COMPREHENSIVE METABOLIC PANEL WITH GFR
ALT: 16 U/L (ref 0–44)
AST: 18 U/L (ref 15–41)
Albumin: 4.1 g/dL (ref 3.5–5.0)
Alkaline Phosphatase: 67 U/L (ref 38–126)
Anion gap: 7 (ref 5–15)
BUN: 7 mg/dL (ref 6–20)
CO2: 25 mmol/L (ref 22–32)
Calcium: 9.2 mg/dL (ref 8.9–10.3)
Chloride: 106 mmol/L (ref 98–111)
Creatinine, Ser: 0.87 mg/dL (ref 0.44–1.00)
GFR, Estimated: >60 mL/min (ref >60)
Glucose, Bld: 95 mg/dL (ref 70–99)
Potassium: 3.4 mmol/L — ABNORMAL LOW (ref 3.5–5.1)
Sodium: 138 mmol/L (ref 135–145)
Total Bilirubin: 0.7 mg/dL (ref 0.0–1.2)
Total Protein: 7.7 g/dL (ref 6.5–8.1)

## 2023-06-17 LAB — RESP PANEL BY RT-PCR (RSV, FLU A&B, COVID)  RVPGX2
Influenza A by PCR: NEGATIVE
Influenza B by PCR: NEGATIVE
Resp Syncytial Virus by PCR: NEGATIVE
SARS Coronavirus 2 by RT PCR: NEGATIVE

## 2023-06-17 LAB — CBC
HCT: 37.8 % (ref 36.0–46.0)
Hemoglobin: 12 g/dL (ref 12.0–15.0)
MCH: 27.5 pg (ref 26.0–34.0)
MCHC: 31.7 g/dL (ref 30.0–36.0)
MCV: 86.5 fL (ref 80.0–100.0)
Platelets: 324 10*3/uL (ref 150–400)
RBC: 4.37 MIL/uL (ref 3.87–5.11)
RDW: 16.2 % — ABNORMAL HIGH (ref 11.5–15.5)
WBC: 6.5 10*3/uL (ref 4.0–10.5)
nRBC: 0 % (ref 0.0–0.2)

## 2023-06-17 LAB — HCG, SERUM, QUALITATIVE: Preg, Serum: NEGATIVE

## 2023-06-17 LAB — LIPASE, BLOOD: Lipase: 31 U/L (ref 11–51)

## 2023-06-17 MED ORDER — METOCLOPRAMIDE HCL 5 MG/ML IJ SOLN
5.0000 mg | Freq: Once | INTRAMUSCULAR | Status: AC
  Administered 2023-06-17: 5 mg via INTRAMUSCULAR
  Filled 2023-06-17: qty 2

## 2023-06-17 MED ORDER — ACETAMINOPHEN 325 MG PO TABS
650.0000 mg | ORAL_TABLET | Freq: Once | ORAL | Status: AC
  Administered 2023-06-17: 650 mg via ORAL
  Filled 2023-06-17: qty 2

## 2023-06-17 NOTE — ED Triage Notes (Signed)
 Patient BIB [facility] from urgent care. Complaining of diarrhea, nausea, vomiting since Saturday.

## 2023-06-17 NOTE — ED Triage Notes (Signed)
"  I am having diarrhea that started Saturday and It is every 5 mins now and just water/liquid, I am getting dizzy from being dehydrated".  "Maybe fever" during theses symptoms. Some nausea, no vomiting. No runny nose. No cough. No urinary problems or concerns.  No chest pain. No sob.

## 2023-06-17 NOTE — ED Provider Notes (Signed)
 [facility]    CSN: [id number] Arrival date & time: 06/17/23  1254      History   Chief Complaint Chief Complaint  Patient presents with   Dizziness   Diarrhea    HPI Sherri [name] is a 38 y.o. female.   Patient here today for an of diarrhea that started 6 days ago and has continued.  She reports that she has been having bowel movements every 5 minutes and is just water and liquid but her stool is dark and she has noticed some blood in her stool as well.  She states she is getting dizzy from feeling dehydrated.  She has diffuse abdominal pain.  She questions possible fever at times.  She has had some nausea but no vomiting.  She denies runny nose or cough.  She has not had chest pain or shortness of breath.  She has taken Pepto-Bismol without resolution.  The history is provided by the patient.  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    Urinary tract infection    Uterine fibroid     Patient Active Problem List   Diagnosis Date Noted   Numbness and tingling of hand 05/13/2023   Moderate episode of recurrent major depressive disorder (HCC) 07/16/2022   Pre-diabetes 07/23/2021   Anxiety and depression 06/24/2021   Fibromyalgia 06/24/2021   Chronic sinusitis 12/26/2020   Sinus pressure 12/26/2020   Prediabetes 07/05/2020   Gastroesophageal reflux disease 04/26/2020   Perianal rash 03/26/2020   Chronic nausea 03/26/2020   Chronic constipation 03/09/2019   Pelvic pain in female 03/09/2019   Hypertension    Genital herpes    Uterine fibroid  12/25/2013   Obesity 12/25/2013   Bacterial vaginitis 04/27/2012    Past Surgical History:  Procedure Laterality Date   CESAREAN SECTION N/A 06/05/2014   Procedure: CESAREAN SECTION;  Surgeon: Malka Sea, DO;  Location: [facility];  Service: Obstetrics;  Laterality: N/A;   WISDOM TOOTH EXTRACTION      OB History     Gravida  1   Para  1   Term      Preterm  1   AB      Living  2      SAB      IAB      Ectopic      Multiple  1   Live Births  2            Home Medications    Prior to Admission medications   Medication Sig Start Date End Date Taking? Authorizing Provider  EMGALITY 120 MG/ML SOAJ  06/03/23  Yes [provider]  Guaifenesin 1200 MG TB12 Take by mouth. 10/17/22  Yes [provider]  methocarbamol (ROBAXIN) 500 MG tablet Take 500 mg by mouth as directed. 09/11/22  Yes [provider]  methylPREDNISolone (MEDROL DOSEPAK) 4 MG TBPK tablet Take 4 mg by mouth as directed. 05/13/23  Yes [provider]  metroNIDAZOLE (METROGEL) 0.75 % vaginal gel Place 1 Applicatorful vaginally at bedtime. 06/02/23  Yes [provider]  montelukast (SINGULAIR) 10 MG tablet Take 10 mg by mouth at bedtime. 01/27/22  Yes [provider]  naproxen (NAPROSYN) 375 MG tablet Take 375 mg by mouth 2 (two) times daily with a meal. 09/11/22  Yes [provider]  valACYclovir (VALTREX) 1000 MG tablet Take 1,000 mg by mouth as directed. 02/03/22  Yes [provider]  acetaminophen (TYLENOL) 500 MG tablet Take 2 tablets (1,000 mg total) by mouth every 6 (six) hours as needed for up to 14 days (pain). 06/04/23 06/18/23  McDonald, Olive Better, DPM  amitriptyline (ELAVIL) 50 MG tablet Take 50 mg by mouth at bedtime. 06/19/22   [provider]  amLODipine (NORVASC) 5 MG tablet Take 1 tablet by mouth daily. 08/11/22   [provider]  azelastine (ASTELIN) 0.1 % nasal spray Place 1 spray into both nostrils 2 (two) times  daily. Use in each nostril as directed 05/16/23   Reddick, Johnathan B, NP  benzonatate (TESSALON) 200 MG capsule Take 1 capsule (200 mg total) by mouth 3 (three) times daily as needed for cough. 05/16/23   Emmanuel Hark B, NP  Blood Pressure Monitoring (BLOOD PRESSURE KIT) KIT Use to check blood pressure twice daily and records BP logs nd bring to your office visits 08/03/22   [provider]  Cholecalciferol (VITAMIN D3) 125 MCG (5000 UT) CAPS Take 1 capsule by mouth daily.    [provider]  clindamycin (CLEOCIN T) 1 % lotion Apply to cleansed face daily in the morning. 06/30/21     cyclobenzaprine (FLEXERIL) 10 MG tablet Take 1 tablet (10 mg total) by mouth 3 (three) times daily as needed for muscle spasms. 06/10/23   McDonald, Olive Better, DPM  Drospirenone (SLYND) 4 MG TABS Take 1 tablet (4 mg total) by mouth daily. 04/15/23   Constant, Peggy, MD  EPINEPHrine (EPIPEN 2-PAK) 0.3 mg/0.3 mL IJ SOAJ injection Inject 0.3 mg into the muscle as needed for anaphylaxis. 12/31/22   Brian Campanile, MD  famotidine (PEPCID) 40 MG tablet Take 1 tablet (40 mg total) by mouth at bedtime. 02/18/23   Brian Campanile, MD  Ferrous Sulfate (IRON) 28 MG TABS Take 1 tablet by mouth daily.    [provider]  fluticasone (FLONASE) 50 MCG/ACT nasal spray Place 1 spray into both nostrils daily. 04/20/23   [provider]  gabapentin (NEURONTIN) 300 MG capsule Take 1 capsule (300 mg total) by mouth 3 (three) times daily for 7 days. 06/04/23 06/11/23  McDonald, Olive Better, DPM  gemfibrozil (LOPID) 600 MG tablet Take by mouth. 06/01/22   [provider]  hydrOXYzine (ATARAX) 25 MG tablet TAKE 1 TABLET BY MOUTH THREE TIMES A DAY AS NEEDED FOR ANXIETY FOR UP TO 90 DAYS 08/11/22   [provider]  ibuprofen (ADVIL) 600 MG tablet Take 1 tablet (600 mg total) by mouth every 6 (six) hours as needed for up to 14 days. 06/04/23 06/18/23  McDonald, Olive Better, DPM  levocetirizine  (XYZAL) 5 MG tablet Take 5 mg by mouth every evening.    [provider]  lisinopril (ZESTRIL) 10 MG tablet Take 10 mg by mouth daily. 04/22/23 07/21/23  [provider]  Na Sulfate-K Sulfate-Mg Sulf 17.5-3.13-1.6 GM/177ML SOLN See admin instructions. 04/10/22   [provider]  omeprazole (PRILOSEC) 40 MG capsule Take 1 capsule (40 mg total) by mouth every morning. 02/18/23   Brian Campanile, MD  ondansetron (ZOFRAN) 4 MG tablet Take 1 tablet (4 mg total) by  mouth every 8 (eight) hours as needed for nausea or vomiting. 06/15/23   McDonald, Rachelle Hora, DPM  rizatriptan (MAXALT) 10 MG tablet Take 1 tablet (10 mg total) by mouth as needed for migraine. May repeat in 2 hours if needed.  Maximum 2 tablets in 24 hours. 06/11/23   Drema Dallas, DO  Vitamin D, Ergocalciferol, (DRISDOL) 1.25 MG (50000 UNIT) CAPS capsule Take by mouth. 07/07/22   [provider]    Family History Family History  Problem Relation Age of Onset   Hypertension Mother    Hypertension Father    Breast cancer Maternal Grandmother        61   Colon cancer Neg Hx    Colon polyps Neg Hx    Kidney disease Neg Hx    Diabetes Neg Hx    Esophageal cancer Neg Hx    Gallbladder disease Neg Hx    Heart disease Neg Hx    Asthma Neg Hx    Stroke Neg Hx    Stomach cancer Neg Hx     Social History Social History   Tobacco Use   Smoking status: Never    Passive exposure: Never   Smokeless tobacco: Never  Vaping Use   Vaping status: Never Used  Substance Use Topics   Alcohol use: No   Drug use: Never     Allergies   Macrobid [nitrofurantoin], Augmentin [amoxicillin-pot clavulanate], Cefdinir, and Metformin and related   Review of Systems Review of Systems  Constitutional:  Positive for fever (subjective?).  HENT:  Negative for congestion.   Eyes:  Negative for discharge and redness.  Respiratory:  Negative for shortness of breath.   Gastrointestinal:  Positive for abdominal  pain, diarrhea and nausea. Negative for vomiting.  Neurological:  Positive for dizziness.     Physical Exam Triage Vital Signs ED Triage Vitals  Encounter Vitals Group     BP 06/17/23 1311 133/84     Systolic BP Percentile --      Diastolic BP Percentile --      Pulse Rate 06/17/23 1311 87     Resp 06/17/23 1311 18     Temp 06/17/23 1311 98.7 F (37.1 C)     Temp Source 06/17/23 1311 Oral     SpO2 06/17/23 1311 97 %     Weight 06/17/23 1309 193 lb (87.5 kg)     Height 06/17/23 1309 4\' 11"  (1.499 m)     Head Circumference --      Peak Flow --      Pain Score 06/17/23 1306 0     Pain Loc --      Pain Education --      Exclude from Growth Chart --    No data found.  Updated Vital Signs BP 133/84 (BP Location: Left Arm)   Pulse 87   Temp 98.7 F (37.1 C) (Oral)   Resp 18   Ht 4\' 11"  (1.499 m)   Wt 193 lb (87.5 kg)   LMP 06/14/2023 (Exact Date)   SpO2 97%   BMI 38.98 kg/m   Visual Acuity Right Eye Distance:   Left Eye Distance:   Bilateral Distance:    Right Eye Near:   Left Eye Near:    Bilateral Near:     Physical Exam Vitals and nursing note reviewed.  Constitutional:      General: She is not in acute distress.    Appearance: Normal appearance. She is not ill-appearing.  HENT:  Head: Normocephalic and atraumatic.  Eyes:     Conjunctiva/sclera: Conjunctivae normal.  Cardiovascular:     Rate and Rhythm: Normal rate and regular rhythm.  Pulmonary:     Effort: Pulmonary effort is normal. No respiratory distress.     Breath sounds: Normal breath sounds. No wheezing, rhonchi or rales.  Abdominal:     General: Abdomen is flat. Bowel sounds are normal. There is no distension.     Palpations: Abdomen is soft.     Tenderness: There is abdominal tenderness (diffuse moderate TTP). There is no guarding or rebound.  Neurological:     Mental Status: She is alert.      UC Treatments / Results  Labs (all labs ordered are listed, but only abnormal results  are displayed) Labs Reviewed - No data to display  EKG   Radiology No results found.  Procedures Procedures (including critical care time)  Medications Ordered in UC Medications - No data to display  Initial Impression / Assessment and Plan / UC Course  I have reviewed the triage vital signs and the nursing notes.  Pertinent labs & imaging results that were available during my care of the patient were reviewed by me and considered in my medical decision making (see chart for details).    Discussed options regarding further assessment in ED vs continued monitoring with oral hydration at home given normal vitals. Patient requests evaluation in the ED but her only method of transportation is [employer] and she reports she cannot afford this. Non-emergent transport desired by patient- EMS called for same.  Final Clinical Impressions(s) / UC Diagnoses   Final diagnoses:  Diarrhea, unspecified type  Generalized abdominal pain  Blood in stool   Discharge Instructions   None    ED Prescriptions   None    PDMP not reviewed this encounter.   Vernestine Gondola, PA-C 06/17/23 1644

## 2023-06-17 NOTE — ED Provider Triage Note (Signed)
 Emergency Medicine Provider Triage Evaluation Note  Junnie Olives , a 38 y.o. female  was evaluated in triage.  Pt complains of abdominal pain with N/V for the past 6 days. Endorses generalized abdominal pain and headaches as well. Denies any new foods or alcohol use prior to onset of symptoms.  Review of Systems  Positive: Abdominal pain, N/V Negative: Fevers, changes to urinary/bowel habits  Physical Exam  BP (!) 143/100   Pulse 78   Temp 98.3 F (36.8 C) (Oral)   Resp 16   Ht 4\' 11"  (1.499 m)   Wt 87.5 kg   LMP 06/14/2023 (Exact Date)   SpO2 100%   BMI 38.98 kg/m  Gen:   Awake, no distress   Resp:  Normal effort  MSK:   Moves extremities without difficulty  Other:    Medical Decision Making  Medically screening exam initiated at 5:48 PM.  Appropriate orders placed.  Carolie A Talent was informed that the remainder of the evaluation will be completed by another provider, this initial triage assessment does not replace that evaluation, and the importance of remaining in the ED until their evaluation is complete.    Sonnie Dusky, PA-C 06/17/23 1754

## 2023-06-17 NOTE — ED Notes (Signed)
 Patient is being discharged from the Urgent Care and sent to the Emergency Department via Non Emergent Transport (called at 1637) . Per Provider, patient is in need of higher level of care due to Dizziness, Diarrhea. Patient is aware and verbalizes understanding of plan of care.  Vitals:   06/17/23 1311  BP: 133/84  Pulse: 87  Resp: 18  Temp: 98.7 F (37.1 C)  SpO2: 97%

## 2023-06-17 NOTE — ED Notes (Signed)
 Patient unable to get transportation to ED, Requesting Non Urgent transport.

## 2023-06-18 ENCOUNTER — Emergency Department (HOSPITAL_COMMUNITY)

## 2023-06-18 ENCOUNTER — Encounter (HOSPITAL_COMMUNITY): Payer: Self-pay

## 2023-06-18 LAB — URINALYSIS, ROUTINE W REFLEX MICROSCOPIC
Bilirubin Urine: NEGATIVE
Glucose, UA: NEGATIVE mg/dL
Ketones, ur: NEGATIVE mg/dL
Leukocytes,Ua: NEGATIVE
Nitrite: NEGATIVE
Protein, ur: NEGATIVE mg/dL
RBC / HPF: >50 RBC/hpf (ref 0–5)
Specific Gravity, Urine: 1.014 (ref 1.005–1.030)
pH: 5 (ref 5.0–8.0)

## 2023-06-18 MED ORDER — MORPHINE SULFATE (PF) 4 MG/ML IV SOLN
4.0000 mg | Freq: Once | INTRAVENOUS | Status: AC
  Administered 2023-06-18: 4 mg via INTRAVENOUS
  Filled 2023-06-18: qty 1

## 2023-06-18 MED ORDER — SODIUM CHLORIDE 0.9 % IV BOLUS
1000.0000 mL | Freq: Once | INTRAVENOUS | Status: AC
  Administered 2023-06-18: 1000 mL via INTRAVENOUS

## 2023-06-18 MED ORDER — IOHEXOL 300 MG/ML  SOLN
100.0000 mL | Freq: Once | INTRAMUSCULAR | Status: AC | PRN
  Administered 2023-06-18: 100 mL via INTRAVENOUS

## 2023-06-18 MED ORDER — METOCLOPRAMIDE HCL 5 MG/ML IJ SOLN
5.0000 mg | Freq: Once | INTRAMUSCULAR | Status: AC
  Administered 2023-06-18: 5 mg via INTRAMUSCULAR
  Filled 2023-06-18: qty 2

## 2023-06-18 NOTE — Discharge Instructions (Addendum)
 Evaluation today was overall reassuring.  Suspect this is a viral process.  Please treat your symptoms conservatively at home with rest and hydration.  Recommend Tylenol  ibuprofen  as well.  Your CT was negative.  If you develop worsening abdominal pain, fever or any other concerning symptom please return to the ED for further evaluation.

## 2023-06-18 NOTE — ED Notes (Signed)
 Patient transported to CT

## 2023-06-18 NOTE — ED Notes (Signed)
 Patient passed fluid challenge and was able to tolerate drink without vomiting.

## 2023-06-18 NOTE — ED Provider Notes (Signed)
 [place] EMERGENC[facility] DEPARTMENT AT [facility] Provider Note   CSN: [id number] Arrival date & time: 06/17/23  1736     History  Chief Complaint  Patient presents with   Diarrhea   HPI Sherri [name] is a 38 y.o. female with history of uterine fibroids, diabetes, hypertension, fibromyalgia MDD presenting for nausea vomiting diarrhea.  Started this past Saturday also endorsing lower abdominal pain.  Denies any urinary symptoms.  Denies sick contacts but states she has been visiting family "more than usual".  Denies fever.  States she is on her period at this time but no other abnormal vaginal bleeding or discharge.   Diarrhea Associated symptoms: abdominal pain and vomiting        Home Medications Prior to Admission medications   Medication Sig Start Date End Date Taking? Authorizing Provider  acetaminophen  (TYLENOL ) 500 MG tablet Take 2 tablets (1,000 mg total) by mouth every 6 (six) hours as needed for up to 14 days (pain). 06/04/23 06/18/23 Yes McDonald, Olive Better, DPM  amitriptyline  (ELAVIL ) 50 MG tablet Take 50 mg by mouth at bedtime. 06/19/22  Yes [provider]  amLODipine (NORVASC) 5 MG tablet Take 1 tablet by mouth daily as needed (HBP). 08/11/22  Yes [provider]  azelastine  (ASTELIN ) 0.1 % nasal spray Place 1 spray into both nostrils 2 (two) times daily. Use in each nostril as directed 05/16/23  Yes Reddick, Johnathan B, NP  benzonatate  (TESSALON ) 200 MG capsule Take 1 capsule (200 mg total) by mouth 3 (three) times daily as needed for cough. 05/16/23  Yes Reddick, Johnathan B, NP  Cholecalciferol (VITAMIN D3) 125 MCG (5000 UT) CAPS Take 1 capsule by mouth daily as needed.   Yes [provider]  clindamycin  (CLEOCIN  T) 1 % lotion Apply to cleansed face daily in the morning. 06/30/21  Yes   cyclobenzaprine  (FLEXERIL ) 10 MG tablet Take 1 tablet (10 mg total) by mouth 3 (three) times daily as needed for muscle spasms. 06/10/23  Yes McDonald, Adam  R, DPM  Drospirenone  (SLYND ) 4 MG TABS Take 1 tablet (4 mg total) by mouth daily. 04/15/23  Yes Constant, Peggy, MD  EMGALITY  120 MG/ML SOAJ  06/03/23  Yes [provider]  EPINEPHrine  (EPIPEN  2-PAK) 0.3 mg/0.3 mL IJ SOAJ injection Inject 0.3 mg into the muscle as needed for anaphylaxis. 12/31/22  Yes Padgett, Rhoderick Ceo, MD  famotidine  (PEPCID ) 40 MG tablet Take 1 tablet (40 mg total) by mouth at bedtime. Patient taking differently: Take 40 mg by mouth at bedtime as needed for heartburn or indigestion. 02/18/23  Yes Padgett, Rhoderick Ceo, MD  Ferrous Sulfate (IRON) 28 MG TABS Take 1 tablet by mouth daily.   Yes [provider]  fluticasone  (FLONASE ) 50 MCG/ACT nasal spray Place 1 spray into both nostrils daily. 04/20/23  Yes [provider]  gabapentin  (NEURONTIN ) 300 MG capsule Take 1 capsule (300 mg total) by mouth 3 (three) times daily for 7 days. 06/04/23 06/18/23 Yes McDonald, Olive Better, DPM  hydrOXYzine  (ATARAX ) 25 MG tablet TAKE 1 TABLET BY MOUTH THREE TIMES A DAY AS NEEDED FOR ANXIETY FOR UP TO 90 DAYS 08/11/22  Yes [provider]  ibuprofen  (ADVIL ) 600 MG tablet Take 1 tablet (600 mg total) by mouth every 6 (six) hours as needed for up to 14 days. 06/04/23 06/18/23 Yes McDonald, Olive Better, DPM  levocetirizine (XYZAL ) 5 MG tablet Take 5 mg by mouth daily as needed for allergies.   Yes [provider]  lisinopril (ZESTRIL) 10 MG tablet Take 10 mg by mouth daily as needed. 04/22/23 07/21/23 Yes [provider]  methocarbamol  (ROBAXIN ) 500 MG tablet Take 500 mg by mouth as directed. 09/11/22  Yes [provider]  metroNIDAZOLE  (METROGEL ) 0.75 % vaginal gel Place 1 Applicatorful vaginally at bedtime as needed. 06/02/23  Yes [provider]  montelukast  (SINGULAIR ) 10 MG tablet Take 10 mg by mouth at bedtime as needed. 01/27/22  Yes [provider]  naproxen  (NAPROSYN ) 375 MG tablet Take 375 mg by mouth as needed for mild pain  (pain score 1-3) or moderate pain (pain score 4-6). 09/11/22  Yes [provider]  omeprazole  (PRILOSEC) 40 MG capsule Take 1 capsule (40 mg total) by mouth every morning. 02/18/23  Yes Padgett, Rhoderick Ceo, MD  ondansetron  (ZOFRAN ) 4 MG tablet Take 1 tablet (4 mg total) by mouth every 8 (eight) hours as needed for nausea or vomiting. 06/15/23  Yes McDonald, Adam R, DPM  ondansetron  (ZOFRAN -ODT) 4 MG disintegrating tablet Take 4 mg by mouth every 8 (eight) hours as needed. 06/16/23  Yes [provider]  rizatriptan  (MAXALT ) 10 MG tablet Take 1 tablet (10 mg total) by mouth as needed for migraine. May repeat in 2 hours if needed.  Maximum 2 tablets in 24 hours. 06/11/23  Yes Jaffe, Adam R, DO  valACYclovir  (VALTREX ) 1000 MG tablet Take 1,000 mg by mouth as needed (Outbreaks). 02/03/22  Yes [provider]  Blood Pressure Monitoring (BLOOD PRESSURE KIT) KIT Use to check blood pressure twice daily and records BP logs nd bring to your office visits 08/03/22   [provider]  gemfibrozil (LOPID) 600 MG tablet Take by mouth. Patient not taking: Reported on 06/18/2023 06/01/22   [provider]  methylPREDNISolone  (MEDROL  DOSEPAK) 4 MG TBPK tablet Take 4 mg by mouth as directed. Patient not taking: Reported on 06/18/2023 05/13/23   [provider]  Na Sulfate-K Sulfate-Mg Sulf 17.5-3.13-1.6 GM/177ML SOLN See admin instructions. Patient not taking: Reported on 06/18/2023 04/10/22   [provider]  Vitamin D , Ergocalciferol , (DRISDOL) 1.25 MG (50000 UNIT) CAPS capsule Take by mouth. Patient not taking: Reported on 06/18/2023 07/07/22   [provider]      Allergies    Macrobid  [nitrofurantoin ], Augmentin  [amoxicillin -pot clavulanate], Cefdinir , and Metformin  and related    Review of Systems   Review of Systems  Gastrointestinal:  Positive for abdominal pain, diarrhea, nausea and vomiting.    Physical Exam Updated Vital Signs BP (!)  136/92 (BP Location: Left Arm)   Pulse 60   Temp 98.5 F (36.9 C) (Oral)   Resp 17   Ht 4\' 11"  (1.499 m)   Wt 87.5 kg   LMP 06/14/2023 (Exact Date) Comment: negative HCG 06/17/23  SpO2 99%   BMI 38.98 kg/m  Physical Exam Vitals and nursing note reviewed.  HENT:     Head: Normocephalic and atraumatic.     Mouth/Throat:     Mouth: Mucous membranes are moist.  Eyes:     General:        Right eye: No discharge.        Left eye: No discharge.     Conjunctiva/sclera: Conjunctivae normal.  Cardiovascular:     Rate and Rhythm: Normal rate and regular rhythm.     Pulses: Normal pulses.     Heart sounds: Normal heart sounds.  Pulmonary:     Effort: Pulmonary effort is normal.     Breath sounds: Normal breath sounds.  Abdominal:  General: Abdomen is flat.     Palpations: Abdomen is soft.     Tenderness: There is abdominal tenderness in the right lower quadrant, suprapubic area and left lower quadrant. There is no right CVA tenderness or left CVA tenderness.  Skin:    General: Skin is warm and dry.  Neurological:     General: No focal deficit present.  Psychiatric:        Mood and Affect: Mood normal.     ED Results / Procedures / Treatments   Labs (all labs ordered are listed, but only abnormal results are displayed) Labs Reviewed  COMPREHENSIVE METABOLIC PANEL WITH GFR - Abnormal; Notable for the following components:      Result Value   Potassium 3.4 (*)    All other components within normal limits  CBC - Abnormal; Notable for the following components:   RDW 16.2 (*)    All other components within normal limits  URINALYSIS, ROUTINE W REFLEX MICROSCOPIC - Abnormal; Notable for the following components:   APPearance HAZY (*)    Hgb urine dipstick LARGE (*)    Bacteria, UA RARE (*)    All other components within normal limits  RESP PANEL BY RT-PCR (RSV, FLU A&B, COVID)  RVPGX2  LIPASE, BLOOD  HCG, SERUM, QUALITATIVE    EKG None  Radiology CT ABDOMEN PELVIS W  CONTRAST Result Date: 06/18/2023 CLINICAL DATA:  Acute nonlocalized abdominal pain, nausea, vomiting EXAM: CT ABDOMEN AND PELVIS WITH CONTRAST TECHNIQUE: Multidetector CT imaging of the abdomen and pelvis was performed using the standard protocol following bolus administration of intravenous contrast. RADIATION DOSE REDUCTION: This exam was performed according to the departmental dose-optimization program which includes automated exposure control, adjustment of the mA and/or kV according to patient size and/or use of iterative reconstruction technique. CONTRAST:  OMNIPAQUE  IOHEXOL  300 MG/ML  SOLN COMPARISON:  09/27/2021 FINDINGS: Lower chest: No acute abnormality. Hepatobiliary: No focal liver abnormality is seen. No gallstones, gallbladder wall thickening, or biliary dilatation. Pancreas: Unremarkable Spleen: Unremarkable Adrenals/Urinary Tract: Adrenal glands are unremarkable. Kidneys are normal, without renal calculi, focal lesion, or hydronephrosis. Bladder is unremarkable. Stomach/Bowel: Stomach is within normal limits. Appendix appears normal. No evidence of bowel wall thickening, distention, or inflammatory changes. Vascular/Lymphatic: No significant vascular findings are present. No enlarged abdominal or pelvic lymph nodes. Reproductive: Uterus and bilateral adnexa are unremarkable. Other: No abdominal wall hernia or abnormality. No abdominopelvic ascites. Musculoskeletal: No acute or significant osseous findings. IMPRESSION: 1. No acute abdominal or pelvic findings. Electronically Signed   By: Worthy Heads M.D.   On: 06/18/2023 03:44    Procedures Procedures    Medications Ordered in ED Medications  acetaminophen  (TYLENOL ) tablet 650 mg (650 mg Oral Given 06/17/23 1756)  metoCLOPramide  (REGLAN ) injection 5 mg (5 mg Intramuscular Given 06/17/23 1758)  sodium chloride  0.9 % bolus 1,000 mL (1,000 mLs Intravenous New Bag/Given 06/18/23 0217)  morphine  (PF) 4 MG/ML injection 4 mg (4 mg  Intravenous Given 06/18/23 0214)  metoCLOPramide  (REGLAN ) injection 5 mg (5 mg Intramuscular Given 06/18/23 0215)  iohexol  (OMNIPAQUE ) 300 MG/ML solution 100 mL (100 mLs Intravenous Contrast Given 06/18/23 0303)    ED Course/ Medical Decision Making/ A&P                                 Medical Decision Making Amount and/or Complexity of Data Reviewed Labs: ordered. Radiology: ordered.  Risk Prescription drug management.   Initial Impression  and Ddx 38 yo well appearing female presenting for abdominal pain, nausea vomiting diarrhea since Saturday.  Exam notable for lower abdominal tenderness.  DDx includes appendicitis, diverticulitis, kidney stone, ovarian torsion, ectopic pregnancy, other. Patient PMH that increases complexity of ED encounter:    Interpretation of Diagnostics - I independent reviewed and interpreted the labs as followed: mild hypokalemia, hematuria  - I independently visualized the following imaging with scope of interpretation limited to determining acute life threatening conditions related to emergency care: CT ab/pelvis, which revealed no acute findings  Patient Reassessment and Ultimate Disposition/Management On reassessment patient stated her symptoms were much better and she no longer abdominal pain.  Fluid challenge without issue.  Workup was overall unremarkable and does not suggest intra-abdominal infection.  Suspect the hematuria is contamination from vaginal bleeding during her menstrual cycle.  Likely a viral process.  Advised her to continue treating her symptoms conservatively at home.  Discussed return precautions.  Discharged in good condition.    Patient management required discussion with the following services or consulting groups:  None  Complexity of Problems Addressed Acute complicated illness or Injury  Additional Data Reviewed and Analyzed Further history obtained from: Further history from spouse/family member, Past medical history and  medications listed in the EMR, and Prior ED visit notes  Patient Encounter Risk Assessment None         Final Clinical Impression(s) / ED Diagnoses Final diagnoses:  Nausea vomiting and diarrhea  Abdominal pain, unspecified abdominal location    Rx / DC Orders ED Discharge Orders     None         Janalee Mcmurray, PA-C 06/18/23 0407    Lindle Rhea, MD 06/18/23 1806

## 2023-06-24 ENCOUNTER — Encounter: Payer: Self-pay | Admitting: Podiatry

## 2023-06-24 ENCOUNTER — Ambulatory Visit: Payer: Medicaid Other | Admitting: Podiatry

## 2023-06-24 VITALS — Ht 59.0 in | Wt 193.0 lb

## 2023-06-24 DIAGNOSIS — M62461 Contracture of muscle, right lower leg: Secondary | ICD-10-CM

## 2023-06-24 DIAGNOSIS — M722 Plantar fascial fibromatosis: Secondary | ICD-10-CM

## 2023-06-24 NOTE — Patient Instructions (Signed)
Call to schedule physical therapy: [facility] at [place] [address]  [phone]  

## 2023-06-25 NOTE — Progress Notes (Signed)
  Subjective:  Patient ID: Sherri  [name], female    DOB: 10-09-1985,  MRN: [id number]  Chief Complaint  Patient presents with   Routine Post Op    Pt is here for second post op visit after surgery to right foot, states still in some pain.    DOS: 06/04/2023 Procedure: Right EPF and endoscopic gastroc recession  38 y.o. female returns for post-op check.  Still sore but pain is improving  Review of Systems: Negative except as noted in the HPI. Denies N/V/F/Ch.   Objective:  There were no vitals filed for this visit. Body mass index is 38.98 kg/m. Constitutional Well developed. Well nourished.  Vascular Foot warm and well perfused. Capillary refill normal to all digits.  Calf is soft and supple, no posterior calf or knee pain, negative Homans' sign  Neurologic Normal speech. Oriented to person, place, and time. Epicritic sensation to light touch grossly present bilaterally.  Dermatologic Skin healing well without signs of infection. Skin edges well coapted without signs of infection.  Orthopedic: Tenderness to palpation noted about the surgical site.    Assessment:   1. Bilateral plantar fasciitis   2. Gastrocnemius equinus of right lower extremity    Plan:  Patient was evaluated and treated and all questions answered.  S/p foot surgery right - Sutures removed uneventfully.  Continue bathing.  Weightbearing as tolerated in boot until next visit.  Begin physical therapy and referral placed.  No follow-ups on file.

## 2023-06-28 ENCOUNTER — Telehealth: Payer: Self-pay | Admitting: Podiatry

## 2023-06-28 NOTE — Telephone Encounter (Signed)
 Patient is requesting referral for physical therapy at [facility]. Patient contact telephone# [phone]

## 2023-06-29 ENCOUNTER — Other Ambulatory Visit (HOSPITAL_COMMUNITY)
Admission: RE | Admit: 2023-06-29 | Discharge: 2023-06-29 | Disposition: A | Discharge status: Home / Self Care | Source: Ambulatory Visit | Attending: Advanced Practice Midwife | Admitting: Advanced Practice Midwife

## 2023-06-29 ENCOUNTER — Encounter: Payer: Self-pay | Admitting: Advanced Practice Midwife

## 2023-06-29 ENCOUNTER — Ambulatory Visit: Admitting: Advanced Practice Midwife

## 2023-06-29 VITALS — BP 139/83 | HR 78 | Ht 59.0 in | Wt 193.0 lb

## 2023-06-29 DIAGNOSIS — N898 Other specified noninflammatory disorders of vagina: Secondary | ICD-10-CM | POA: Insufficient documentation

## 2023-06-29 DIAGNOSIS — B9689 Other specified bacterial agents as the cause of diseases classified elsewhere: Secondary | ICD-10-CM | POA: Diagnosis not present

## 2023-06-29 DIAGNOSIS — Z30013 Encounter for initial prescription of injectable contraceptive: Secondary | ICD-10-CM | POA: Diagnosis not present

## 2023-06-29 DIAGNOSIS — N760 Acute vaginitis: Secondary | ICD-10-CM | POA: Diagnosis not present

## 2023-06-29 DIAGNOSIS — Z3009 Encounter for other general counseling and advice on contraception: Secondary | ICD-10-CM

## 2023-06-29 MED ORDER — MEDROXYPROGESTERONE ACETATE 150 MG/ML IM SUSP
150.0000 mg | Freq: Once | INTRAMUSCULAR | Status: AC
  Administered 2023-06-29: 150 mg via INTRAMUSCULAR

## 2023-06-29 MED ORDER — MEDROXYPROGESTERONE ACETATE 150 MG/ML IM SUSP: 150.0000 mg | INTRAMUSCULAR | 3 refills | Status: DC

## 2023-06-29 MED ORDER — METRONIDAZOLE 0.75 % VA GEL: 1.0000 | Freq: Every day | VAGINAL | 1 refills | Status: DC

## 2023-06-29 NOTE — Progress Notes (Signed)
 Pt c/o hot flashes, nausea and headaches with slynd 

## 2023-06-29 NOTE — Progress Notes (Signed)
   GYNECOLOGY PROGRESS NOTE  History:  38 y.o. G1P0102 presents to [facility] office today for problem gyn visit. She reports nausea, headaches with Slynd  and wants to talk about other options.  She denies h/a, dizziness, shortness of breath, n/v, or fever/chills.    The following portions of the patient's history were reviewed and updated as appropriate: allergies, current medications, past family history, past medical history, past social history, past surgical history and problem list. Last pap smear in 2022 was normal, neg HRHPV.  Health Maintenance Due  Topic Date Due   COVID-19 Vaccine (2 - 2024-25 season) 11/01/2022     Review of Systems:  Pertinent items are noted in HPI.   Objective:  Physical Exam Blood pressure 139/83, pulse 78, height 4\' 11"  (1.499 m), weight 193 lb (87.5 kg), last menstrual period 06/14/2023. VS reviewed, nursing note reviewed,  Constitutional: well developed, well nourished, no distress HEENT: normocephalic CV: normal rate Pulm/chest wall: normal effort Breast Exam: deferred Abdomen: soft Neuro: alert and oriented x 3 Skin: warm, dry Psych: affect normal Pelvic exam: Deferred  Assessment & Plan:  1. Encounter for counseling regarding contraception (Primary) --Pt with side effects to Slynd .  Side effects may be with all hormonal contraception, with all progesterone only methods, or only with Slynd .  Pt with HTN, and BP is borderline today.  I do not currently recommend estrogen, but we can revisit this if BP remains well controlled.   --Discussed pt contraceptive plans and reviewed contraceptive methods based on pt preferences and effectiveness.  Pt prefers Depo Provera.  --F/U in 3 months with Pap visit and birth control follow up  - medroxyPROGESTERone (DEPO-PROVERA) injection 150 mg - medroxyPROGESTERone (DEPO-PROVERA) 150 MG/ML injection; Inject 1 mL (150 mg total) into the muscle every 3 (three) months.  Dispense: 1 mL; Refill: 3  2. Vaginal  discharge  - Cervicovaginal ancillary only( [place])  3. Bacterial vaginosis --Recurrent BV, will tx with Metrogel , swab collected today.  --Reviewed prevention, including probiotics with pt --F/U in 3 months   Return in about 3 months (around 09/28/2023) for Gyn follow up annual/Pap/contraception visit with Me.   Arlester Bence, CNM 9:03 AM

## 2023-06-30 ENCOUNTER — Ambulatory Visit (INDEPENDENT_AMBULATORY_CARE_PROVIDER_SITE_OTHER)

## 2023-06-30 ENCOUNTER — Telehealth: Payer: Self-pay

## 2023-06-30 ENCOUNTER — Telehealth: Payer: Self-pay | Admitting: Podiatry

## 2023-06-30 DIAGNOSIS — J309 Allergic rhinitis, unspecified: Secondary | ICD-10-CM

## 2023-06-30 LAB — CERVICOVAGINAL ANCILLARY ONLY
Bacterial Vaginitis (gardnerella): NEGATIVE
Candida Glabrata: NEGATIVE
Candida Vaginitis: NEGATIVE
Chlamydia: NEGATIVE
Comment: NEGATIVE
Comment: NEGATIVE
Comment: NEGATIVE
Comment: NEGATIVE
Comment: NEGATIVE
Comment: NORMAL
Neisseria Gonorrhea: NEGATIVE
Trichomonas: NEGATIVE

## 2023-06-30 NOTE — Telephone Encounter (Signed)
 Magda Schneider with [facility] called and left a message - they have scheduled the patient. There is a form for transportation and they usually ask the referring facility / provider to fill out said form. This does not sound right - anytime a patient comes to our facility, we complete the form when needed. Please let me know if we are obligated to complete this form.

## 2023-06-30 NOTE — Telephone Encounter (Signed)
 DOS: 07/30/23  EPF (LT) -[id number] GASTROCNEMIUS RECESS [phone]        EFFECTIVE DATE:   06/01/2023 - 06/30/2023    PER JASMINE B OF [employer] NO PRIOR AUTH IS REQ FOR CPT CODES 29528, [id number]  REF# [id number]

## 2023-07-01 NOTE — Therapy (Addendum)
 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    Urinary tract infection    Uterine fibroid    Past Surgical History:  Procedure Laterality Date   CESAREAN SECTION N/A 06/05/2014   Procedure: CESAREAN SECTION;  Surgeon: Malka Sea, DO;  Location: [facility];  Service: Obstetrics;  Laterality: N/A;   WISDOM TOOTH EXTRACTION     Patient Active Problem List   Diagnosis Date Noted   Initiation of Depo Provera  06/29/2023   Numbness and tingling of hand 05/13/2023   Moderate episode of recurrent major depressive disorder (HCC) 07/16/2022   Pre-diabetes 07/23/2021   Anxiety and depression 06/24/2021   Fibromyalgia 06/24/2021   Chronic sinusitis 12/26/2020   Sinus pressure 12/26/2020   Prediabetes 07/05/2020   Gastroesophageal reflux disease 04/26/2020   Perianal rash 03/26/2020   Chronic nausea 03/26/2020   Chronic constipation 03/09/2019   Pelvic pain in female 03/09/2019   Hypertension    Genital herpes    Uterine fibroid 12/25/2013   Obesity 12/25/2013   Bacterial vaginitis 04/27/2012    PCP: Brendan Call, MD  REFERRING PROVIDER: Floyce Hutching, DPM  THERAPY DIAG:  Pain in right foot - Plan: PT plan of care cert/re-cert  Pain in right lower leg - Plan: PT plan of care cert/re-cert  Muscle weakness - Plan: PT plan of care cert/re-cert  Unsteadiness on feet - Plan:  PT plan of care cert/re-cert  Other abnormalities of gait and mobility - Plan: PT plan of care cert/re-cert  REFERRING DIAG: Bilateral plantar fasciitis [M72.2], 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 No, Number of falls: 0  MOI/History of condition:  Onset date: 4/4  SUBJECTIVE STATEMENT  Sherri [name] is a 38 y.o. female who presents to clinic with chief complaint of calf and foot pain after plantar fascia release and gastorc recession on 4/4.  She has had a fair amount of pain since the surgery but this is slowly improving.  She has had come n/t in the lateral aspect of her foot.  She is WBAT in boot but has mostly been using her knee scooter at home.  She has a several year history of PF pain which interfered with her work and daily activities.    Red flags:  denies  Pain:  Are you having pain? 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  Education administrator: Transport planner Dominance: NA  Patient Goals/Specific Activities: reduce pain   OBJECTIVE:   DIAGNOSTIC FINDINGS:  NA  GENERAL OBSERVATION/GAIT:  CAM boot using knee scooter  SENSATION: Light touch: Deficits lateral R foot  PALPATION: TTP throughout R LE particularly around surgical sites  LE MMT:  MMT Right (Eval) Left (Eval)  Hip flexion  (L2, L3) 4 4+  Knee extension (L3) 3+ 4  Knee flexion    Hip abduction 4 4  Hip extension    Hip external rotation    Hip internal rotation    Hip adduction    Ankle dorsiflexion (L4)    Ankle plantarflexion (S1) nt nt  Ankle inversion    Ankle eversion    Great Toe ext (L5)    Grossly     (Blank rows = not tested, score listed is out of 5 possible points.  N = WNL, D = diminished, C = clear for gross weakness with myotome testing, * = concordant pain with testing)  LE ROM:  ROM Right (Eval) Left (Eval)  Hip flexion    Hip extension    Hip abduction    Hip adduction    Hip internal rotation    Hip external rotation    Knee extension    Knee flexion    Ankle dorsiflexion 6* 11  Ankle plantarflexion N* n  Ankle inversion 30 30  Ankle eversion 20 20   (Blank rows = not tested, N = WNL, * = concordant pain with testing)  Functional Tests  Eval    10 m max gait speed: 23'', .43 m/s, AD: N                                                          PATIENT SURVEYS:  LEFS:    TODAY'S TREATMENT: Therapeutic Exercise: Creating, reviewing, and completing below HEP   PATIENT EDUCATION (/HM):  POC, diagnosis, prognosis, HEP, and outcome measures.  Pt educated via explanation, demonstration, and handout (HEP).  Pt confirms understanding verbally. Discussed need to gently introduce movement and strengthening to prepare for ambulation.  Encouraged her to start weaning from knee scooter at home and try walking for progressively longer at home in the boot.  HOME EXERCISE PROGRAM: Access Code: [email or web address] URL: [email or web address] Date: 07/02/2023 Prepared by: Lesleigh Rash  Exercises - Seated Toe Towel Scrunches  - 1 x daily - 7 x weekly - 1 sets - 2 reps - 1 minute hold - Ankle Inversion Eversion Towel Slide  - 1 x daily - 7 x weekly - 3 sets - 10 reps - Seated Ankle Plantar Flexion with Resistance Loop  - 1 x daily - 7 x weekly - 2 sets - 10  reps - Side to Side Weight Shift with Counter Support  - 1 x daily - 7 x weekly - 3 sets - 10 reps - Staggered Stance Forward Backward Weight Shift with Counter Support  - 1 x daily - 7 x weekly - 3 sets - 10 reps  Treatment priorities   Eval        Gait in CAM boot        Gentle strengthening                                  ASSESSMENT:  CLINICAL IMPRESSION: Sherri [name]  is a 38 y.o. female who presents to clinic with signs and sxs consistent with R calf and foot pain following PF and gastroc release on 4/4.  Ankle ROM is well maintained.  PT with high fear avoidance.  Discussed need to gently introduce movement and strengthening to prepare for ambulation.  Encouraged her to start weaning from knee scooter at home and try walking for progressively longer at home in the boot.  Sherri [name]  will benefit from skilled PT to address relevant deficits and improve ability to complete required tasks such as walking, work, and housework.    OBJECTIVE IMPAIRMENTS: Pain, R ankle DF ROM, ankle strength, gait, balance  ACTIVITY LIMITATIONS: standing, walking, working, housework, squatting, bending, lifting  PERSONAL FACTORS: See medical history and pertinent history   REHAB POTENTIAL: Good  CLINICAL DECISION MAKING: Evolving/moderate complexity  EVALUATION COMPLEXITY: Moderate   GOALS:   SHORT TERM GOALS: Target date: 07/30/2023   Sherri [name]  will be >75% HEP compliant to improve carryover between sessions and facilitate independent management of condition  Evaluation: ongoing Goal status: INITIAL   LONG TERM GOALS: Target date: 07/30/2023  Sherri [name]  will self report >/= 50% decrease in pain from evaluation to improve function in daily tasks  Evaluation/Baseline: 8/10 max pain Goal status: INITIAL   2.  Sherri [name]  will improve 10 meter max gait speed to 1 m/s (.1 m/s MCID) to show functional improvement in ambulation and allow ambulation to store and in community  Evaluation/Baseline: .43  m/s Goal status: INITIAL   Norms:     3.  Sherri [name]  will be able to stand for >30'' in tandem stance on foam, to show a significant improvement in balance in order to reduce fall risk   Evaluation/Baseline: not tested Goal status: INITIAL   4.  Sherri [name]  will show a >/= 27 pt improvement in LEFS score (MCID is ~11% or 9 pts) as a proxy for functional improvement   Evaluation/Baseline: 28/80 pts Goal status: INITIAL   5.  Sherri [name]  will be able to wean from boot and normalize gait, not limited by pain  Evaluation/Baseline: limited Goal status: INITIAL   PLAN: PT FREQUENCY: 1-2x/week  PT DURATION: 8 weeks  PLANNED INTERVENTIONS:  97164- PT Re-evaluation, 97110-Therapeutic exercises, 97530- Therapeutic activity, 97112- Neuromuscular re-education, 97535- Self Care, 16109- Manual therapy, [id number]- Gait training, [id number]- Aquatic Therapy, [id number]- Electrical stimulation (manual), [id number]- Vasopneumatic device, [id number]- Traction (mechanical), [id number]- Ionotophoresis 4mg /ml Dexamethasone , Taping, Dry Needling, Joint manipulation, and Spinal manipulation.   Uzma Hellmer PT, DPT 07/02/2023, 8:55 AM

## 2023-07-02 ENCOUNTER — Ambulatory Visit: Attending: Podiatry | Admitting: Physical Therapy

## 2023-07-02 ENCOUNTER — Other Ambulatory Visit: Payer: Self-pay

## 2023-07-02 ENCOUNTER — Encounter: Payer: Self-pay | Admitting: Physical Therapy

## 2023-07-02 DIAGNOSIS — M6281 Muscle weakness (generalized): Secondary | ICD-10-CM | POA: Insufficient documentation

## 2023-07-02 DIAGNOSIS — M79671 Pain in right foot: Secondary | ICD-10-CM | POA: Insufficient documentation

## 2023-07-02 DIAGNOSIS — M79661 Pain in right lower leg: Secondary | ICD-10-CM | POA: Diagnosis present

## 2023-07-02 DIAGNOSIS — R2681 Unsteadiness on feet: Secondary | ICD-10-CM | POA: Insufficient documentation

## 2023-07-02 DIAGNOSIS — R2689 Other abnormalities of gait and mobility: Secondary | ICD-10-CM | POA: Diagnosis present

## 2023-07-02 DIAGNOSIS — M62461 Contracture of muscle, right lower leg: Secondary | ICD-10-CM | POA: Diagnosis not present

## 2023-07-02 DIAGNOSIS — M722 Plantar fascial fibromatosis: Secondary | ICD-10-CM | POA: Insufficient documentation

## 2023-07-06 NOTE — Therapy (Signed)
 OUTPATIENT PHYSICAL THERAPY LOWER EXTREMITY TREATMENT  Patient Name: Makayli  VIVIENE WAECHTER MRN: [id number] DOB:[birth year], 38 y.o., female Today's Date: 07/07/2023   PT End of Session - 07/07/23 0758     Visit Number 2    Date for PT Re-Evaluation 08/27/23    Authorization Type [employer]    PT Start Time 0800    PT Stop Time 0854    PT Time Calculation (min) 54 min    Activity Tolerance Patient tolerated treatment well    Behavior During Therapy WFL for tasks assessed/performed              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    Urinary tract infection    Uterine fibroid    Past Surgical History:  Procedure Laterality Date   CESAREAN SECTION N/A 06/05/2014   Procedure: CESAREAN SECTION;  Surgeon: Malka Sea, DO;  Location: [facility];  Service: Obstetrics;  Laterality: N/A;   WISDOM TOOTH EXTRACTION     Patient Active Problem List   Diagnosis Date Noted   Initiation of Depo Provera  06/29/2023   Numbness and tingling of hand 05/13/2023   Moderate episode of recurrent major depressive disorder (HCC) 07/16/2022   Pre-diabetes 07/23/2021   Anxiety and depression 06/24/2021   Fibromyalgia 06/24/2021   Chronic sinusitis 12/26/2020   Sinus pressure 12/26/2020   Prediabetes 07/05/2020   Gastroesophageal reflux disease 04/26/2020   Perianal rash 03/26/2020   Chronic nausea 03/26/2020   Chronic constipation 03/09/2019   Pelvic pain in female 03/09/2019   Hypertension    Genital herpes    Uterine fibroid 12/25/2013   Obesity 12/25/2013   Bacterial vaginitis 04/27/2012    PCP: Brendan Call, MD  REFERRING PROVIDER: Floyce Hutching, DPM  THERAPY DIAG:  Pain in right foot  Pain in right lower leg  Muscle weakness  Unsteadiness on feet  Other abnormalities of gait and  mobility  REFERRING DIAG: Bilateral plantar fasciitis [M72.2], 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 No, Number of falls: 0  MOI/History of condition:  Onset date: 4/4  SUBJECTIVE STATEMENT Pt reports she is walking without the the boot at home per instructions from Dr. Leticia Raven. Pt reports using the scooter for mobility outside of her home. Pt endorses posterior heel and plantar foot pain. Pt states she is completing her HEP approx 5 mins every other day.  EVAL: Sehar  A Bogus is a 38 y.o. female who presents to clinic with chief complaint of calf and foot pain after plantar fascia release and gastorc recession on 4/4.  She has had a fair amount of pain since the surgery but this is slowly improving.  She has had come n/t in the lateral aspect of her foot.  She is WBAT in boot but has mostly been using her knee scooter at home.  She has a several year history of PF pain which interfered with her work and daily activities.    Red flags:  denies   Pain:  Are you having pain? 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  Education administrator: Transport planner Dominance: NA  Patient Goals/Specific Activities: reduce pain   OBJECTIVE:   DIAGNOSTIC FINDINGS:  NA  GENERAL OBSERVATION/GAIT:  CAM boot using  knee scooter  SENSATION: Light touch: Deficits lateral R foot  PALPATION: TTP throughout R LE particularly around surgical sites  LE MMT:  MMT Right (Eval) Left (Eval)  Hip flexion (L2, L3) 4 4+  Knee extension (L3) 3+ 4  Knee flexion    Hip abduction 4 4  Hip extension    Hip external rotation    Hip internal rotation    Hip adduction    Ankle dorsiflexion (L4)    Ankle plantarflexion (S1) nt nt  Ankle inversion    Ankle eversion    Great Toe ext (L5)    Grossly     (Blank rows = not tested, score listed is out of 5 possible points.  N = WNL, D = diminished, C = clear for gross weakness with myotome testing, * = concordant pain with testing)  LE ROM:  ROM Right (Eval) Left (Eval)  Hip flexion    Hip extension    Hip abduction    Hip adduction    Hip internal rotation    Hip external rotation    Knee extension    Knee flexion    Ankle dorsiflexion 6* 11  Ankle plantarflexion N* n  Ankle inversion 30 30  Ankle eversion 20 20   (Blank rows = not tested, N = WNL, * = concordant pain with testing)  Functional Tests  Eval    10 m max gait speed: 23'', .43 m/s, AD: N                                                          PATIENT SURVEYS:  LEFS:    TODAY'S TREATMENT: [facility] Adult PT Treatment:                                                DATE: 07/07/23 Therapeutic Exercise: Ambulation c walking boot 185", pt reports increased soreness Seated Toe Towel Scrunches 1 minute hold Ankle Inversion Eversion Towel Slide 20 reps Long sit DF stretch x2 30" Seated Ankle Plantar Flexion YTB 3x10 reps Side to Side Weight Shift 1 min Staggered Stance Forward Backward Weight Shift 1 min Self Care: Completion of HEP 2x daily and use of cold pack for pain and swelling management Continued weaning from use of the scooter, walking more with the scooter Modalities: Cold pack to the L ankle'foot x10 mins with elevation  Therapeutic Exercise: Creating,  reviewing, and completing below HEP   PATIENT EDUCATION ([place]/HM):  POC, diagnosis, prognosis, HEP, and outcome measures.  Pt educated via explanation, demonstration, and  handout (HEP).  Pt confirms understanding verbally. Discussed need to gently introduce movement and strengthening to prepare for ambulation.  Encouraged her to start weaning from knee scooter at home and try walking for progressively longer at home in the boot.  HOME EXERCISE PROGRAM: Access Code: [email or web address] URL: [email or web address] Date: 07/02/2023 Prepared by: Lesleigh Rash  Exercises - Seated Toe Towel Scrunches  - 1 x daily - 7 x weekly - 1 sets - 2 reps - 1 minute hold - Ankle Inversion Eversion Towel Slide  - 1 x daily - 7 x weekly - 3 sets - 10 reps - Seated Ankle Plantar Flexion with Resistance Loop  - 1 x daily - 7 x weekly - 2 sets - 10 reps - Side to Side Weight Shift with Counter Support  - 1 x daily - 7 x weekly - 3 sets - 10 reps - Staggered Stance Forward Backward Weight Shift with Counter Support  - 1 x daily - 7 x weekly - 3 sets - 10 reps  Treatment priorities   Eval        Gait in CAM boot        Gentle strengthening                                  ASSESSMENT:  CLINICAL IMPRESSION: Pt presents with continued high fear avoidance limiting her participation in the rehab process. PT was completed to promote active movement and weight bearing of the R ankle and foot. Pt was encouraged to completed her HEP 2x daily and to use cold packs afterwards and as needed to manage pain and swelling. Pt tolerated the session without an overall increase in pain. She did state it felt like her foot was going to pop during the completion of each exercise. A cold pack was provided at end of session for symptom management.  EVAL:Macrina  is a 38 y.o. female who presents to clinic with signs and sxs consistent with R calf and foot pain following PF and gastroc release on 4/4.  Ankle ROM is well  maintained.  PT with high fear avoidance.  Discussed need to gently introduce movement and strengthening to prepare for ambulation.  Encouraged her to start weaning from knee scooter at home and try walking for progressively longer at home in the boot.  Nikia  will benefit from skilled PT to address relevant deficits and improve ability to complete required tasks such as walking, work, and housework.    OBJECTIVE IMPAIRMENTS: Pain, R ankle DF ROM, ankle strength, gait, balance  ACTIVITY LIMITATIONS: standing, walking, working, housework, squatting, bending, lifting  PERSONAL FACTORS: See medical history and pertinent history   REHAB POTENTIAL: Good  CLINICAL DECISION MAKING: Evolving/moderate complexity  EVALUATION COMPLEXITY: Moderate   GOALS:   SHORT TERM GOALS: Target date: 07/30/2023   Takaya  will be >75% HEP compliant to improve carryover between sessions and facilitate independent management of condition  Evaluation: ongoing Goal status: INITIAL   LONG TERM GOALS: Target date: 07/30/2023  Tonye  will self report >/= 50% decrease in pain from evaluation to improve function in daily tasks  Evaluation/Baseline: 8/10 max pain Goal status: INITIAL   2.  Alfreda  will improve 10 meter max gait speed to 1 m/s (.1 m/s MCID) to show functional improvement in ambulation and allow ambulation to store and in community  Evaluation/Baseline: .43 m/s Goal status: INITIAL   Norms:     3.  Jaymie  will  be able to stand for >30'' in tandem stance on foam, to show a significant improvement in balance in order to reduce fall risk   Evaluation/Baseline: not tested Goal status: INITIAL   4.  Andreika  will show a >/= 27 pt improvement in LEFS score (MCID is ~11% or 9 pts) as a proxy for functional improvement   Evaluation/Baseline: 28/80 pts Goal status: INITIAL   5.  Neila  will be able to wean from boot and normalize gait, not limited by pain  Evaluation/Baseline:  limited Goal status: INITIAL   PLAN: PT FREQUENCY: 1-2x/week  PT DURATION: 8 weeks  PLANNED INTERVENTIONS:  97164- PT Re-evaluation, 97110-Therapeutic exercises, 97530- Therapeutic activity, 97112- Neuromuscular re-education, 97535- Self Care, 97140- Manual therapy, [id number]- Gait training, [id number]- Aquatic Therapy, [id number]- Electrical stimulation (manual), [id number]- Vasopneumatic device, [id number]- Traction (mechanical), [id number]- Ionotophoresis 4mg /ml Dexamethasone , Taping, Dry Needling, Joint manipulation, and Spinal manipulation.  Obaloluwa Delatte MS, PT 07/07/23 8:55 AM

## 2023-07-07 ENCOUNTER — Ambulatory Visit

## 2023-07-07 DIAGNOSIS — M79671 Pain in right foot: Secondary | ICD-10-CM

## 2023-07-07 DIAGNOSIS — M79661 Pain in right lower leg: Secondary | ICD-10-CM

## 2023-07-07 DIAGNOSIS — R2681 Unsteadiness on feet: Secondary | ICD-10-CM

## 2023-07-07 DIAGNOSIS — M6281 Muscle weakness (generalized): Secondary | ICD-10-CM

## 2023-07-07 DIAGNOSIS — R2689 Other abnormalities of gait and mobility: Secondary | ICD-10-CM

## 2023-07-09 ENCOUNTER — Other Ambulatory Visit: Payer: Self-pay | Admitting: Allergy

## 2023-07-09 ENCOUNTER — Other Ambulatory Visit: Payer: Self-pay | Admitting: Allergy and Immunology

## 2023-07-09 ENCOUNTER — Encounter: Payer: Self-pay | Admitting: Physical Therapy

## 2023-07-09 ENCOUNTER — Ambulatory Visit: Admitting: Physical Therapy

## 2023-07-09 DIAGNOSIS — M79671 Pain in right foot: Secondary | ICD-10-CM

## 2023-07-09 DIAGNOSIS — M79661 Pain in right lower leg: Secondary | ICD-10-CM

## 2023-07-09 DIAGNOSIS — R2689 Other abnormalities of gait and mobility: Secondary | ICD-10-CM

## 2023-07-09 DIAGNOSIS — M6281 Muscle weakness (generalized): Secondary | ICD-10-CM

## 2023-07-09 DIAGNOSIS — R2681 Unsteadiness on feet: Secondary | ICD-10-CM

## 2023-07-09 NOTE — Therapy (Signed)
 OUTPATIENT PHYSICAL THERAPY LOWER EXTREMITY TREATMENT  Patient Name: Sherri [name] MRN: [id number] DOB:06/01/1985, 38 y.o., female Today's Date: 07/09/2023   PT End of Session - 07/09/23 0753     Visit Number 3    Date for PT Re-Evaluation 08/27/23    Authorization Type Approved 8 visits 07/02/23-08/31/23    PT Start Time 0800    PT Stop Time 0841    PT Time Calculation (min) 41 min    Activity Tolerance Patient tolerated treatment well    Behavior During Therapy WFL for tasks assessed/performed              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    Urinary tract infection    Uterine fibroid    Past Surgical History:  Procedure Laterality Date   CESAREAN SECTION N/A 06/05/2014   Procedure: CESAREAN SECTION;  Surgeon: Malka Sea, DO;  Location: [facility];  Service: Obstetrics;  Laterality: N/A;   WISDOM TOOTH EXTRACTION     Patient Active Problem List   Diagnosis Date Noted   Initiation of Depo Provera  06/29/2023   Numbness and tingling of hand 05/13/2023   Moderate episode of recurrent major depressive disorder (HCC) 07/16/2022   Pre-diabetes 07/23/2021   Anxiety and depression 06/24/2021   Fibromyalgia 06/24/2021   Chronic sinusitis 12/26/2020   Sinus pressure 12/26/2020   Prediabetes 07/05/2020   Gastroesophageal reflux disease 04/26/2020   Perianal rash 03/26/2020   Chronic nausea 03/26/2020   Chronic constipation 03/09/2019   Pelvic pain in female 03/09/2019   Hypertension    Genital herpes    Uterine fibroid 12/25/2013   Obesity 12/25/2013   Bacterial vaginitis 04/27/2012    PCP: Brendan Call, MD  REFERRING PROVIDER: Floyce Hutching, DPM  THERAPY DIAG:  Pain in right foot  Pain in right lower leg  Muscle weakness  Unsteadiness on feet  Other  abnormalities of gait and mobility  REFERRING DIAG: Bilateral plantar fasciitis [M72.2], 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 No, Number of falls: 0  MOI/History of condition:  Onset date: 4/4  SUBJECTIVE STATEMENT Pt reports that she has been trying to work on her exercises at home but continues to have pain and is fearful of putting weight through her foot, even with the boot on.  EVAL: Sherri  [name] is a 38 y.o. female who presents to clinic with chief complaint of calf and foot pain after plantar fascia release and gastorc recession on 4/4.  She has had a fair amount of pain since the surgery but this is slowly improving.  She has had come n/t in the lateral aspect of her foot.  She is WBAT in boot but has mostly been using her knee scooter at home.  She has a several year history of PF pain which interfered with  her work and daily activities.    Red flags:  denies   Pain:  Are you having pain? 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  Education administrator: Transport planner Dominance: NA  Patient Goals/Specific Activities: reduce pain   OBJECTIVE:   DIAGNOSTIC FINDINGS:  NA  GENERAL OBSERVATION/GAIT:  CAM boot using knee scooter  SENSATION: Light touch: Deficits lateral R foot  PALPATION: TTP throughout  R LE particularly around surgical sites  LE MMT:  MMT Right (Eval) Left (Eval)  Hip flexion (L2, L3) 4 4+  Knee extension (L3) 3+ 4  Knee flexion    Hip abduction 4 4  Hip extension    Hip external rotation    Hip internal rotation    Hip adduction    Ankle dorsiflexion (L4)    Ankle plantarflexion (S1) nt nt  Ankle inversion    Ankle eversion    Great Toe ext (L5)    Grossly     (Blank rows = not tested, score listed is out of 5 possible points.  N = WNL, D = diminished, C = clear for gross weakness with myotome testing, * = concordant pain with testing)  LE ROM:  ROM Right (Eval) Left (Eval)  Hip flexion    Hip extension    Hip abduction    Hip adduction    Hip internal rotation    Hip external rotation    Knee extension    Knee flexion    Ankle dorsiflexion 6* 11  Ankle plantarflexion N* n  Ankle inversion 30 30  Ankle eversion 20 20   (Blank rows = not tested, N = WNL, * = concordant pain with testing)  Functional Tests  Eval    10 m max gait speed: 23'', .43 m/s, AD: N                                                          PATIENT SURVEYS:  LEFS:    TODAY'S TREATMENT: [facility] Adult PT Treatment:                                                DATE: 07/09/2023 Therapeutic Exercise: Nustep in comfortable ROM BAPS board - L3 - DF/PF - Inv/Ev Rock paper scissors for toes (curl, spread, GT flex/ext) Marble pick up Ankle 4 way - RTB - 20x  Therapeutic Activity  In // Fwd and retro walking  Lat walking Staggered w/s Education regarding importance of progressive weightbearing and strengthening; importance of doing this at home.  HOME EXERCISE PROGRAM: Access Code: [email or web address] URL: [email or web address] Date: 07/02/2023 Prepared by: Lesleigh Rash  Exercises - Seated Toe Towel Scrunches  - 1 x daily - 7 x weekly - 1 sets - 2 reps - 1 minute hold - Ankle Inversion Eversion Towel Slide  - 1 x daily - 7 x weekly - 3 sets -  10 reps - Seated Ankle Plantar Flexion with Resistance Loop  - 1 x daily -  7 x weekly - 2 sets - 10 reps - Side to Side Weight Shift with Counter Support  - 1 x daily - 7 x weekly - 3 sets - 10 reps - Staggered Stance Forward Backward Weight Shift with Counter Support  - 1 x daily - 7 x weekly - 3 sets - 10 reps  Treatment priorities   Eval 5/9       Gait in CAM boot ''       Gentle strengthening ''        Foot intrinsics                         ASSESSMENT:  CLINICAL IMPRESSION: Pt presents with continued high fear avoidance limiting her participation in the rehab process. Emphasized education during activities regarding the importance of progressing w/b tolerance, particularly through mid and forefoot as pt tends to supinate and heel walk to avoid discomfort.  Encouraged her to stick with staggered w/s at home and manage with ice, rest following.  Pt with only transient pain increase with w/s today and tried to point out that the pain subsided quickly; pt somewhat receptive.  EVAL:Akaysha  is a 38 y.o. female who presents to clinic with signs and sxs consistent with R calf and foot pain following PF and gastroc release on 4/4.  Ankle ROM is well maintained.  PT with high fear avoidance.  Discussed need to gently introduce movement and strengthening to prepare for ambulation.  Encouraged her to start weaning from knee scooter at home and try walking for progressively longer at home in the boot.  Cathleen  will benefit from skilled PT to address relevant deficits and improve ability to complete required tasks such as walking, work, and housework.    OBJECTIVE IMPAIRMENTS: Pain, R ankle DF ROM, ankle strength, gait, balance  ACTIVITY LIMITATIONS: standing, walking, working, housework, squatting, bending, lifting  PERSONAL FACTORS: See medical history and pertinent history   REHAB POTENTIAL: Good  CLINICAL DECISION MAKING: Evolving/moderate complexity  EVALUATION COMPLEXITY:  Moderate   GOALS:   SHORT TERM GOALS: Target date: 07/30/2023   Nakeisha  will be >75% HEP compliant to improve carryover between sessions and facilitate independent management of condition  Evaluation: ongoing Goal status: INITIAL   LONG TERM GOALS: Target date: 07/30/2023  Janvi  will self report >/= 50% decrease in pain from evaluation to improve function in daily tasks  Evaluation/Baseline: 8/10 max pain Goal status: INITIAL   2.  Kaylor  will improve 10 meter max gait speed to 1 m/s (.1 m/s MCID) to show functional improvement in ambulation and allow ambulation to store and in community  Evaluation/Baseline: .43 m/s Goal status: INITIAL   Norms:     3.  Stephonie  will be able to stand for >30'' in tandem stance on foam, to show a significant improvement in balance in order to reduce fall risk   Evaluation/Baseline: not tested Goal status: INITIAL   4.  Hollyn  will show a >/= 27 pt improvement in LEFS score (MCID is ~11% or 9 pts) as a proxy for functional improvement   Evaluation/Baseline: 28/80 pts Goal status: INITIAL   5.  Berniece  will be able to wean from boot and normalize gait, not limited by pain  Evaluation/Baseline: limited Goal status: INITIAL   PLAN: PT FREQUENCY: 1-2x/week  PT DURATION: 8 weeks  PLANNED INTERVENTIONS:  97164- PT Re-evaluation, 97110-Therapeutic exercises, 97530- Therapeutic activity, [id number]- Neuromuscular re-education, 97535- Self Care, 16109- Manual therapy, [id number]- Gait training, [id number]- Aquatic  Therapy, [id number]- Electrical stimulation (manual), 40981- Vasopneumatic device, [id number]- Traction (mechanical), 19147- Ionotophoresis 4mg /ml Dexamethasone , Taping, Dry Needling, Joint manipulation, and Spinal manipulation.  Khylee Algeo E Zebastian Carico PT 07/09/23 9:20 AM

## 2023-07-10 ENCOUNTER — Encounter (HOSPITAL_COMMUNITY): Admission: EM | Disposition: A | Discharge status: Home / Self Care | Payer: Self-pay | Source: Home / Self Care

## 2023-07-10 ENCOUNTER — Emergency Department (HOSPITAL_COMMUNITY)

## 2023-07-10 ENCOUNTER — Other Ambulatory Visit: Payer: Self-pay

## 2023-07-10 ENCOUNTER — Emergency Department (HOSPITAL_COMMUNITY): Admitting: Certified Registered Nurse Anesthetist

## 2023-07-10 ENCOUNTER — Observation Stay (HOSPITAL_COMMUNITY)
Admission: EM | Admit: 2023-07-10 | Discharge: 2023-07-13 | Disposition: A | Discharge status: Home / Self Care | Attending: Surgery | Admitting: Surgery

## 2023-07-10 ENCOUNTER — Emergency Department (HOSPITAL_BASED_OUTPATIENT_CLINIC_OR_DEPARTMENT_OTHER): Admitting: Certified Registered Nurse Anesthetist

## 2023-07-10 ENCOUNTER — Encounter (HOSPITAL_COMMUNITY): Payer: Self-pay

## 2023-07-10 DIAGNOSIS — K3580 Unspecified acute appendicitis: Secondary | ICD-10-CM | POA: Diagnosis not present

## 2023-07-10 DIAGNOSIS — Z79899 Other long term (current) drug therapy: Secondary | ICD-10-CM | POA: Insufficient documentation

## 2023-07-10 DIAGNOSIS — I10 Essential (primary) hypertension: Secondary | ICD-10-CM | POA: Insufficient documentation

## 2023-07-10 DIAGNOSIS — E119 Type 2 diabetes mellitus without complications: Secondary | ICD-10-CM | POA: Insufficient documentation

## 2023-07-10 DIAGNOSIS — K353 Acute appendicitis with localized peritonitis, without perforation or gangrene: Principal | ICD-10-CM

## 2023-07-10 DIAGNOSIS — R109 Unspecified abdominal pain: Secondary | ICD-10-CM | POA: Diagnosis present

## 2023-07-10 HISTORY — PX: LAPAROSCOPIC APPENDECTOMY: SHX408

## 2023-07-10 LAB — URINALYSIS, W/ REFLEX TO CULTURE (INFECTION SUSPECTED)
Bilirubin Urine: NEGATIVE
Glucose, UA: NEGATIVE mg/dL
Hgb urine dipstick: NEGATIVE
Ketones, ur: NEGATIVE mg/dL
Leukocytes,Ua: NEGATIVE
Nitrite: NEGATIVE
Protein, ur: NEGATIVE mg/dL
Specific Gravity, Urine: 1.024 (ref 1.005–1.030)
pH: 5 (ref 5.0–8.0)

## 2023-07-10 LAB — COMPREHENSIVE METABOLIC PANEL WITH GFR
ALT: 16 U/L (ref 0–44)
AST: 18 U/L (ref 15–41)
Albumin: 4.2 g/dL (ref 3.5–5.0)
Alkaline Phosphatase: 70 U/L (ref 38–126)
Anion gap: 7 (ref 5–15)
BUN: 8 mg/dL (ref 6–20)
CO2: 22 mmol/L (ref 22–32)
Calcium: 9.4 mg/dL (ref 8.9–10.3)
Chloride: 111 mmol/L (ref 98–111)
Creatinine, Ser: 0.96 mg/dL (ref 0.44–1.00)
GFR, Estimated: >60 mL/min (ref >60)
Glucose, Bld: 99 mg/dL (ref 70–99)
Potassium: 3.5 mmol/L (ref 3.5–5.1)
Sodium: 140 mmol/L (ref 135–145)
Total Bilirubin: 0.4 mg/dL (ref 0.0–1.2)
Total Protein: 7.9 g/dL (ref 6.5–8.1)

## 2023-07-10 LAB — WET PREP, GENITAL
Clue Cells Wet Prep HPF POC: NONE SEEN
Sperm: NONE SEEN
Trich, Wet Prep: NONE SEEN
WBC, Wet Prep HPF POC: <10 (ref <10)
Yeast Wet Prep HPF POC: NONE SEEN

## 2023-07-10 LAB — CBC
HCT: 37.2 % (ref 36.0–46.0)
Hemoglobin: 11.9 g/dL — ABNORMAL LOW (ref 12.0–15.0)
MCH: 27.5 pg (ref 26.0–34.0)
MCHC: 32 g/dL (ref 30.0–36.0)
MCV: 86.1 fL (ref 80.0–100.0)
Platelets: 282 10*3/uL (ref 150–400)
RBC: 4.32 MIL/uL (ref 3.87–5.11)
RDW: 17.2 % — ABNORMAL HIGH (ref 11.5–15.5)
WBC: 7 10*3/uL (ref 4.0–10.5)
nRBC: 0 % (ref 0.0–0.2)

## 2023-07-10 LAB — HCG, QUANTITATIVE, PREGNANCY: hCG, Beta Chain, Quant, S: <1 m[IU]/mL (ref <5)

## 2023-07-10 LAB — LIPASE, BLOOD: Lipase: 31 U/L (ref 11–51)

## 2023-07-10 LAB — TROPONIN I (HIGH SENSITIVITY): Troponin I (High Sensitivity): 4 ng/L (ref <18)

## 2023-07-10 SURGERY — APPENDECTOMY, LAPAROSCOPIC
Anesthesia: General | Site: Abdomen

## 2023-07-10 MED ORDER — ONDANSETRON HCL 4 MG/2ML IJ SOLN: 4.0000 mg | Freq: Once | INTRAMUSCULAR | Status: DC | PRN

## 2023-07-10 MED ORDER — CELECOXIB 200 MG PO CAPS
ORAL_CAPSULE | ORAL | Status: AC
  Filled 2023-07-10: qty 1

## 2023-07-10 MED ORDER — CHLORHEXIDINE GLUCONATE CLOTH 2 % EX PADS: 6.0000 | MEDICATED_PAD | Freq: Once | CUTANEOUS | Status: DC

## 2023-07-10 MED ORDER — DOCUSATE SODIUM 100 MG PO CAPS
100.0000 mg | ORAL_CAPSULE | Freq: Two times a day (BID) | ORAL | Status: DC
  Administered 2023-07-10 – 2023-07-13 (×6): 100 mg via ORAL
  Filled 2023-07-10 (×6): qty 1

## 2023-07-10 MED ORDER — ONDANSETRON HCL 4 MG/2ML IJ SOLN
INTRAMUSCULAR | Status: DC | PRN
  Administered 2023-07-10: 4 mg via INTRAVENOUS

## 2023-07-10 MED ORDER — GABAPENTIN 300 MG PO CAPS
ORAL_CAPSULE | ORAL | Status: AC
  Filled 2023-07-10: qty 1

## 2023-07-10 MED ORDER — LACTATED RINGERS IV SOLN: INTRAVENOUS | Status: DC | PRN

## 2023-07-10 MED ORDER — MIDAZOLAM HCL 2 MG/2ML IJ SOLN
INTRAMUSCULAR | Status: DC | PRN
  Administered 2023-07-10: 2 mg via INTRAVENOUS

## 2023-07-10 MED ORDER — FENTANYL CITRATE PF 50 MCG/ML IJ SOSY
PREFILLED_SYRINGE | INTRAMUSCULAR | Status: AC
  Filled 2023-07-10: qty 1

## 2023-07-10 MED ORDER — PIPERACILLIN-TAZOBACTAM 3.375 G IVPB 30 MIN: 3.3750 g | Freq: Three times a day (TID) | INTRAVENOUS | Status: DC

## 2023-07-10 MED ORDER — ONDANSETRON HCL 4 MG/2ML IJ SOLN
INTRAMUSCULAR | Status: AC
  Filled 2023-07-10: qty 8

## 2023-07-10 MED ORDER — ENOXAPARIN SODIUM 40 MG/0.4ML IJ SOSY
40.0000 mg | PREFILLED_SYRINGE | INTRAMUSCULAR | Status: DC
  Administered 2023-07-11 – 2023-07-13 (×3): 40 mg via SUBCUTANEOUS
  Filled 2023-07-10 (×3): qty 0.4

## 2023-07-10 MED ORDER — ONDANSETRON HCL 4 MG/2ML IJ SOLN
4.0000 mg | Freq: Once | INTRAMUSCULAR | Status: AC
  Administered 2023-07-10: 4 mg via INTRAVENOUS
  Filled 2023-07-10: qty 2

## 2023-07-10 MED ORDER — GABAPENTIN 100 MG PO CAPS
300.0000 mg | ORAL_CAPSULE | Freq: Every day | ORAL | Status: DC | PRN
  Administered 2023-07-11: 300 mg via ORAL
  Filled 2023-07-10: qty 3

## 2023-07-10 MED ORDER — SUCCINYLCHOLINE CHLORIDE 200 MG/10ML IV SOSY
PREFILLED_SYRINGE | INTRAVENOUS | Status: AC
  Filled 2023-07-10: qty 20

## 2023-07-10 MED ORDER — SENNA 8.6 MG PO TABS
1.0000 | ORAL_TABLET | Freq: Two times a day (BID) | ORAL | Status: DC
  Administered 2023-07-10 – 2023-07-13 (×6): 8.6 mg via ORAL
  Filled 2023-07-10 (×6): qty 1

## 2023-07-10 MED ORDER — MORPHINE SULFATE (PF) 2 MG/ML IV SOLN
2.0000 mg | INTRAVENOUS | Status: DC | PRN
  Administered 2023-07-10: 2 mg via INTRAVENOUS
  Filled 2023-07-10: qty 1

## 2023-07-10 MED ORDER — BUPIVACAINE-EPINEPHRINE (PF) 0.25% -1:200000 IJ SOLN
INTRAMUSCULAR | Status: AC
Start: 2023-07-10 — End: ?
  Filled 2023-07-10: qty 30

## 2023-07-10 MED ORDER — LIDOCAINE HCL (PF) 2 % IJ SOLN
INTRAMUSCULAR | Status: AC
Start: 2023-07-10 — End: ?
  Filled 2023-07-10: qty 25

## 2023-07-10 MED ORDER — GABAPENTIN 300 MG PO CAPS
300.0000 mg | ORAL_CAPSULE | ORAL | Status: AC
  Administered 2023-07-10: 300 mg via ORAL

## 2023-07-10 MED ORDER — HYDRALAZINE HCL 20 MG/ML IJ SOLN: 10.0000 mg | INTRAMUSCULAR | Status: DC | PRN

## 2023-07-10 MED ORDER — 0.9 % SODIUM CHLORIDE (POUR BTL) OPTIME
TOPICAL | Status: DC | PRN
  Administered 2023-07-10: 1000 mL

## 2023-07-10 MED ORDER — SODIUM CHLORIDE 0.9 % IV BOLUS
1000.0000 mL | Freq: Once | INTRAVENOUS | Status: AC
Start: 2023-07-10 — End: 2023-07-10
  Administered 2023-07-10: 1000 mL via INTRAVENOUS

## 2023-07-10 MED ORDER — ACETAMINOPHEN 500 MG PO TABS
1000.0000 mg | ORAL_TABLET | Freq: Four times a day (QID) | ORAL | Status: DC
  Administered 2023-07-10 – 2023-07-13 (×11): 1000 mg via ORAL
  Filled 2023-07-10 (×11): qty 2

## 2023-07-10 MED ORDER — PIPERACILLIN-TAZOBACTAM 3.375 G IVPB
3.3750 g | Freq: Three times a day (TID) | INTRAVENOUS | Status: DC
  Administered 2023-07-10: 3.375 g via INTRAVENOUS
  Filled 2023-07-10: qty 50

## 2023-07-10 MED ORDER — SUGAMMADEX SODIUM 200 MG/2ML IV SOLN
INTRAVENOUS | Status: DC | PRN
  Administered 2023-07-10: 400 mg via INTRAVENOUS

## 2023-07-10 MED ORDER — LIDOCAINE HCL (PF) 2 % IJ SOLN
INTRAMUSCULAR | Status: DC | PRN
  Administered 2023-07-10: 100 mg via INTRADERMAL

## 2023-07-10 MED ORDER — FENTANYL CITRATE PF 50 MCG/ML IJ SOSY
25.0000 ug | PREFILLED_SYRINGE | INTRAMUSCULAR | Status: DC | PRN
  Administered 2023-07-10 (×2): 50 ug via INTRAVENOUS

## 2023-07-10 MED ORDER — OXYCODONE HCL 5 MG PO TABS
5.0000 mg | ORAL_TABLET | ORAL | Status: DC | PRN
  Administered 2023-07-10: 10 mg via ORAL
  Administered 2023-07-11: 5 mg via ORAL
  Administered 2023-07-11 (×3): 10 mg via ORAL
  Administered 2023-07-11: 5 mg via ORAL
  Administered 2023-07-12 – 2023-07-13 (×5): 10 mg via ORAL
  Filled 2023-07-10: qty 2
  Filled 2023-07-10 (×2): qty 1
  Filled 2023-07-10 (×8): qty 2

## 2023-07-10 MED ORDER — ONDANSETRON HCL 4 MG/2ML IJ SOLN: 4.0000 mg | Freq: Four times a day (QID) | INTRAMUSCULAR | Status: DC

## 2023-07-10 MED ORDER — GABAPENTIN 300 MG PO CAPS
ORAL_CAPSULE | ORAL | Status: AC
Start: 2023-07-10 — End: 2023-07-11
  Filled 2023-07-10: qty 1

## 2023-07-10 MED ORDER — IOHEXOL 300 MG/ML  SOLN
100.0000 mL | Freq: Once | INTRAMUSCULAR | Status: AC | PRN
  Administered 2023-07-10: 100 mL via INTRAVENOUS

## 2023-07-10 MED ORDER — POLYETHYLENE GLYCOL 3350 17 G PO PACK: 17.0000 g | PACK | Freq: Every day | ORAL | Status: DC | PRN

## 2023-07-10 MED ORDER — ENOXAPARIN SODIUM 40 MG/0.4ML IJ SOSY
PREFILLED_SYRINGE | INTRAMUSCULAR | Status: AC
  Filled 2023-07-10: qty 0.4

## 2023-07-10 MED ORDER — DEXAMETHASONE SODIUM PHOSPHATE 10 MG/ML IJ SOLN
INTRAMUSCULAR | Status: AC
  Filled 2023-07-10: qty 6

## 2023-07-10 MED ORDER — ACETAMINOPHEN 500 MG PO TABS
ORAL_TABLET | ORAL | Status: AC
  Filled 2023-07-10: qty 2

## 2023-07-10 MED ORDER — CYCLOBENZAPRINE HCL 10 MG PO TABS
10.0000 mg | ORAL_TABLET | Freq: Three times a day (TID) | ORAL | Status: DC | PRN
Start: 2023-07-10 — End: 2023-07-13
  Administered 2023-07-10 – 2023-07-13 (×6): 10 mg via ORAL
  Filled 2023-07-10 (×6): qty 1

## 2023-07-10 MED ORDER — CELECOXIB 200 MG PO CAPS
200.0000 mg | ORAL_CAPSULE | ORAL | Status: AC
  Administered 2023-07-10: 200 mg via ORAL

## 2023-07-10 MED ORDER — BUPIVACAINE-EPINEPHRINE 0.25% -1:200000 IJ SOLN
INTRAMUSCULAR | Status: DC | PRN
  Administered 2023-07-10: 30 mL

## 2023-07-10 MED ORDER — LACTATED RINGERS IR SOLN
Status: DC | PRN
  Administered 2023-07-10: 1000 mL

## 2023-07-10 MED ORDER — DEXAMETHASONE SODIUM PHOSPHATE 10 MG/ML IJ SOLN
INTRAMUSCULAR | Status: DC | PRN
  Administered 2023-07-10: 10 mg via INTRAVENOUS

## 2023-07-10 MED ORDER — MIDAZOLAM HCL 2 MG/2ML IJ SOLN
INTRAMUSCULAR | Status: AC
  Filled 2023-07-10: qty 2

## 2023-07-10 MED ORDER — DEXMEDETOMIDINE HCL IN NACL 80 MCG/20ML IV SOLN
INTRAVENOUS | Status: DC | PRN
  Administered 2023-07-10: 12 ug via INTRAVENOUS

## 2023-07-10 MED ORDER — ROCURONIUM BROMIDE 10 MG/ML (PF) SYRINGE
PREFILLED_SYRINGE | INTRAVENOUS | Status: AC
  Filled 2023-07-10: qty 50

## 2023-07-10 MED ORDER — HYDROMORPHONE HCL 1 MG/ML IJ SOLN
1.0000 mg | Freq: Once | INTRAMUSCULAR | Status: AC
  Administered 2023-07-10: 1 mg via INTRAVENOUS
  Filled 2023-07-10: qty 1

## 2023-07-10 MED ORDER — AMISULPRIDE (ANTIEMETIC) 5 MG/2ML IV SOLN: 10.0000 mg | Freq: Once | INTRAVENOUS | Status: DC | PRN

## 2023-07-10 MED ORDER — FENTANYL CITRATE (PF) 100 MCG/2ML IJ SOLN
INTRAMUSCULAR | Status: AC
  Filled 2023-07-10: qty 2

## 2023-07-10 MED ORDER — PROPOFOL 10 MG/ML IV BOLUS
INTRAVENOUS | Status: AC
  Filled 2023-07-10: qty 20

## 2023-07-10 MED ORDER — PHENYLEPHRINE 80 MCG/ML (10ML) SYRINGE FOR IV PUSH (FOR BLOOD PRESSURE SUPPORT)
PREFILLED_SYRINGE | INTRAVENOUS | Status: AC
  Filled 2023-07-10: qty 20

## 2023-07-10 MED ORDER — ONDANSETRON HCL 4 MG/2ML IJ SOLN
4.0000 mg | Freq: Four times a day (QID) | INTRAMUSCULAR | Status: DC | PRN
  Administered 2023-07-10 – 2023-07-13 (×6): 4 mg via INTRAVENOUS
  Filled 2023-07-10 (×6): qty 2

## 2023-07-10 MED ORDER — PROPOFOL 10 MG/ML IV BOLUS
INTRAVENOUS | Status: DC | PRN
  Administered 2023-07-10: 170 mg via INTRAVENOUS

## 2023-07-10 MED ORDER — ACETAMINOPHEN 500 MG PO TABS
1000.0000 mg | ORAL_TABLET | ORAL | Status: AC
  Administered 2023-07-10: 1000 mg via ORAL

## 2023-07-10 MED ORDER — ENOXAPARIN SODIUM 40 MG/0.4ML IJ SOSY
40.0000 mg | PREFILLED_SYRINGE | Freq: Once | INTRAMUSCULAR | Status: AC
  Administered 2023-07-10: 40 mg via SUBCUTANEOUS

## 2023-07-10 MED ORDER — ROCURONIUM BROMIDE 10 MG/ML (PF) SYRINGE
PREFILLED_SYRINGE | INTRAVENOUS | Status: DC | PRN
  Administered 2023-07-10: 60 mg via INTRAVENOUS

## 2023-07-10 MED ORDER — FENTANYL CITRATE (PF) 250 MCG/5ML IJ SOLN
INTRAMUSCULAR | Status: DC | PRN
  Administered 2023-07-10 (×2): 50 ug via INTRAVENOUS

## 2023-07-10 MED ORDER — DEXMEDETOMIDINE HCL IN NACL 80 MCG/20ML IV SOLN
INTRAVENOUS | Status: AC
  Filled 2023-07-10: qty 20

## 2023-07-10 SURGICAL SUPPLY — 35 items
BAG COUNTER SPONGE SURGICOUNT (BAG) IMPLANT
CABLE HIGH FREQUENCY MONO STRZ (ELECTRODE) ×1 IMPLANT
CLIP APPLIE ROT 10 11.4 M/L (STAPLE) IMPLANT
COVER SURGICAL LIGHT HANDLE (MISCELLANEOUS) ×1 IMPLANT
CUTTER FLEX LINEAR 45M (STAPLE) IMPLANT
DERMABOND ADVANCED .7 DNX12 (GAUZE/BANDAGES/DRESSINGS) ×1 IMPLANT
ELECT REM PT RETURN 15FT ADLT (MISCELLANEOUS) ×1 IMPLANT
ENDOLOOP SUT PDS II 0 18 (SUTURE) IMPLANT
GLOVE INDICATOR 6.5 STRL GRN (GLOVE) ×1 IMPLANT
GLOVE SURG MICRO LTX SZ6 (GLOVE) ×1 IMPLANT
GOWN STRL REUS W/ TWL XL LVL3 (GOWN DISPOSABLE) ×1 IMPLANT
IRRIGATION SUCT STRKRFLW 2 WTP (MISCELLANEOUS) ×1 IMPLANT
KIT BASIN OR (CUSTOM PROCEDURE TRAY) ×1 IMPLANT
KIT TURNOVER KIT A (KITS) IMPLANT
LHOOK LAP DISP 36CM (ELECTROSURGICAL) IMPLANT
NDL INSUFFLATION 14GA 120MM (NEEDLE) ×1 IMPLANT
NEEDLE INSUFFLATION 14GA 120MM (NEEDLE) ×1 IMPLANT
PENCIL SMOKE EVACUATOR (MISCELLANEOUS) IMPLANT
POUCH LAPAROSCOPIC INSTRUMENT (MISCELLANEOUS) ×1 IMPLANT
RELOAD 45 VASCULAR/THIN (ENDOMECHANICALS) IMPLANT
RELOAD STAPLE 45 2.5 WHT GRN (ENDOMECHANICALS) IMPLANT
RELOAD STAPLE 45 3.5 BLU ETS (ENDOMECHANICALS) IMPLANT
RELOAD STAPLE TA45 3.5 REG BLU (ENDOMECHANICALS) ×1 IMPLANT
SCISSORS LAP 5X35 DISP (ENDOMECHANICALS) IMPLANT
SET TUBE SMOKE EVAC HIGH FLOW (TUBING) ×1 IMPLANT
SHEARS HARMONIC 36 ACE (MISCELLANEOUS) IMPLANT
SLEEVE Z-THREAD 5X100MM (TROCAR) ×1 IMPLANT
SPIKE FLUID TRANSFER (MISCELLANEOUS) ×1 IMPLANT
SUT MNCRL AB 4-0 PS2 18 (SUTURE) ×1 IMPLANT
SYSTEM BAG RETRIEVAL 10MM (BASKET) ×1 IMPLANT
TOWEL OR 17X26 10 PK STRL BLUE (TOWEL DISPOSABLE) ×1 IMPLANT
TRAY FOLEY MTR SLVR 16FR STAT (SET/KITS/TRAYS/PACK) IMPLANT
TRAY LAPAROSCOPIC (CUSTOM PROCEDURE TRAY) ×1 IMPLANT
TROCAR BALLN 12MMX100 BLUNT (TROCAR) ×1 IMPLANT
TROCAR Z-THREAD OPTICAL 5X100M (TROCAR) ×1 IMPLANT

## 2023-07-10 NOTE — Progress Notes (Signed)
 Patient states that oxycodone  makes her have N/V. MD Ramiro Burly was notified and suggested to take Zofran  because most pain medications can cause N/V.

## 2023-07-10 NOTE — Anesthesia Procedure Notes (Signed)
 Procedure Name: Intubation Date/Time: 07/10/2023 6:21 PM  Performed by: Virgil Griffiths, CRNAPre-anesthesia Checklist: Patient identified, Emergency Drugs available, Suction available and Patient being monitored Patient Re-evaluated:Patient Re-evaluated prior to induction Oxygen Delivery Method: Circle System Utilized Preoxygenation: Pre-oxygenation with 100% oxygen Induction Type: IV induction Ventilation: Mask ventilation without difficulty Laryngoscope Size: Mac and 3 Grade View: Grade I Tube type: Oral Tube size: 7.0 mm Number of attempts: 1 Airway Equipment and Method: Stylet and Oral airway Placement Confirmation: ETT inserted through vocal cords under direct vision, positive ETCO2 and breath sounds checked- equal and bilateral Secured at: 22 cm Tube secured with: Tape Dental Injury: Teeth and Oropharynx as per pre-operative assessment

## 2023-07-10 NOTE — H&P (Signed)
 HPI  Sherri [name] is an 38 y.o. female with history of HTN, DM, fibromyalgia, genital herpes, uterine fibroids who presents to ED with SOB, right sided abdominal pain with nausea and no emesis.  CT scan showed acute appendicitis without evidence of perforation. No leukocytosis.   Patient has not eaten today. Only prior abdominal surgery is cesarean section.  10 point review of systems is negative except as listed above in HPI.  Objective  Past Medical History: 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    Urinary tract infection    Uterine fibroid     Past Surgical History: Past Surgical History:  Procedure Laterality Date   CESAREAN SECTION N/A 06/05/2014   Procedure: CESAREAN SECTION;  Surgeon: Malka Sea, DO;  Location: [facility];  Service: Obstetrics;  Laterality: N/A;   FOOT SURGERY     WISDOM TOOTH EXTRACTION      Family History:  Family History  Problem Relation Age of Onset   Hypertension Mother    Hypertension Father    Breast cancer Maternal Grandmother        84   Colon cancer Neg Hx    Colon polyps Neg Hx    Kidney disease Neg Hx    Diabetes Neg Hx    Esophageal cancer Neg Hx    Gallbladder disease Neg Hx    Heart disease Neg Hx    Asthma Neg Hx    Stroke Neg Hx    Stomach cancer Neg Hx     Social History:  reports that she has never smoked. She has never been exposed to tobacco smoke. She has never used smokeless tobacco. She reports that she does not drink alcohol and does not use drugs.  Allergies:  Allergies  Allergen Reactions   Macrobid  [Nitrofurantoin ] Nausea And Vomiting   Augmentin  [Amoxicillin -Pot Clavulanate]     Pt states it makes her sick   Cefdinir  Itching   Metformin  And Related Nausea And Vomiting    Medications: I have reviewed the  patient's current medications.  Labs: I have personally reviewed all labs for the past 24h  Imaging: I have personally reviewed and interpreted all imaging for the past 24h and agree with the radiologist's impression.  CT ABDOMEN PELVIS W CONTRAST Result Date: 07/10/2023 CLINICAL DATA:  Right lower quadrant abdominal pain. EXAM: CT ABDOMEN AND PELVIS WITH CONTRAST TECHNIQUE: Multidetector CT imaging of the abdomen and pelvis was performed using the standard protocol following bolus administration of intravenous contrast. RADIATION DOSE REDUCTION: This exam was performed according to the departmental dose-optimization program which includes automated exposure control, adjustment of the mA and/or kV according to patient size and/or use of iterative reconstruction technique. CONTRAST:  OMNIPAQUE  IOHEXOL  300 MG/ML  SOLN COMPARISON:  06/18/2023 FINDINGS: Lower chest: No acute abnormality. Hepatobiliary: No focal liver abnormality is seen. No gallstones, gallbladder wall thickening, or biliary dilatation. Pancreas: Unremarkable. No pancreatic ductal dilatation or surrounding inflammatory changes. Spleen: Normal in size without focal abnormality. Adrenals/Urinary Tract: Normal adrenal glands. No nephrolithiasis, hydronephrosis or suspicious mass. Urinary bladder appears normal. Stomach/Bowel: Stomach is within normal limits. The appendix is thickened with surrounding inflammatory changes compatible with acute appendicitis. The appendix measures 1 cm in diameter. There is no pathologic dilatation of the large or small bowel  loops. No bowel wall thickening or inflammation. Vascular/Lymphatic: Normal appearance of the abdominal aorta. No signs abdominopelvic adenopathy. Prominent right lower quadrant ileocolic lymph nodes are likely reactive. Reproductive: Uterus and bilateral adnexa are unremarkable. Other: No abdominal wall hernia or abnormality. No abdominopelvic ascites. No focal fluid collections to suggest  abscess. Musculoskeletal: No acute or significant osseous findings. IMPRESSION: Acute appendicitis. No evidence for abscess or perforation. Electronically Signed   By: Kimberley Penman M.D.   On: 07/10/2023 12:49   DG Chest Portable 1 View Result Date: 07/10/2023 CLINICAL DATA:  Chest pain. EXAM: PORTABLE CHEST 1 VIEW COMPARISON:  08/07/2022 FINDINGS: Stable heart size allowing for differences in technique. Mediastinal contours are unchanged. The lungs are clear. Pulmonary vasculature is normal. No consolidation, pleural effusion, or pneumothorax. No acute osseous abnormalities are seen. IMPRESSION: No active disease. Electronically Signed   By: Chadwick Colonel M.D.   On: 07/10/2023 10:10     Physical Exam Blood pressure (!) 144/79, pulse 75, temperature 98.3 F (36.8 C), temperature source Oral, resp. rate 18, height 4\' 11"  (1.499 m), weight 87.5 kg, last menstrual period 06/14/2023. Constitutional: well-developed, well-nourished HEENT: pupils equal, round, reactive to light, moist conjunctiva, hearing intact Oropharynx: mucous membranes moist CV: Regular rate and rhythm, normotensive Chest: equal chest rise bilaterally normal respiratory effort on room air Abdomen: soft, nondistended, tender to palpation in RLQ Extremities: moves all extremities, no peripheral edema Skin: warm, dry, no rashes Psych: normal memory, normal mood/affect  Neuro: No focal neurologic deficits, A&Ox3    Assessment   Sherri [name] is an 38 y.o. female with acute appendicitis  Plan  - Proceed to OR for laparoscopic appendectomy - The pathophysiology of appendicitis was discussed with the patient.  Natural history risks without surgery was discussed.   I feel the risks of no intervention outweigh the operative risks; therefore, I recommended diagnostic laparoscopy with appendectomy.  Laparoscopic & open techniques were discussed. Risks including but not limited to: bleeding, infection, abscess, leak, reoperation,  injury to other organs, need for repair of tissues / organs, possible ileocecectomy, possible conversion to open operation, possible need for drain, hernia, and post-op ileus were discussed. Goals of post-operative recovery were discussed as well.  Questions were answered.  The patient expresses understanding & wishes to proceed with surgery.   I reviewed ED provider notes, last 24 h vitals and pain scores, last 48 h intake and output, last 24 h labs and trends, and last 24 h imaging results.  This care required moderate level of medical decision making.    Freddrick Jaffe, MD [facility]

## 2023-07-10 NOTE — Progress Notes (Signed)
 MD Ramiro Burly was notified that patient's pain is still 10 out of 10

## 2023-07-10 NOTE — Progress Notes (Signed)
 Verbal order by MD Ramiro Burly for IV Zofran  4mg  every 6 hours.

## 2023-07-10 NOTE — Anesthesia Preprocedure Evaluation (Signed)
 Anesthesia Evaluation  Patient identified by MRN, date of birth, ID band Patient awake    Reviewed: Allergy  & Precautions, NPO status , Patient's Chart, lab work & pertinent test results  Airway Mallampati: II  TM Distance: >3 FB Neck ROM: Full    Dental  (+) Teeth Intact, Dental Advisory Given   Pulmonary neg pulmonary ROS   Pulmonary exam normal breath sounds clear to auscultation       Cardiovascular hypertension, Pt. on medications Normal cardiovascular exam Rhythm:Regular Rate:Normal     Neuro/Psych  PSYCHIATRIC DISORDERS Anxiety Depression    negative neurological ROS     GI/Hepatic Neg liver ROS,GERD  Medicated,,acute appendicitis   Endo/Other  diabetes, Type 2  Obesity   Renal/GU negative Renal ROS     Musculoskeletal  (+)  Fibromyalgia -  Abdominal   Peds  Hematology  (+) Blood dyscrasia, anemia   Anesthesia Other Findings Day of surgery medications reviewed with the patient.  Reproductive/Obstetrics negative OB ROS                             Anesthesia Physical Anesthesia Plan  ASA: 2 and emergent  Anesthesia Plan: General   Post-op Pain Management: Tylenol  PO (pre-op)*, Celebrex PO (pre-op)* and Gabapentin  PO (pre-op)*   Induction: Intravenous  PONV Risk Score and Plan: 4 or greater and Midazolam, Dexamethasone  and Ondansetron   Airway Management Planned: Oral ETT  Additional Equipment:   Intra-op Plan:   Post-operative Plan: Extubation in OR  Informed Consent: I have reviewed the patients History and Physical, chart, labs and discussed the procedure including the risks, benefits and alternatives for the proposed anesthesia with the patient or authorized representative who has indicated his/her understanding and acceptance.     Dental advisory given  Plan Discussed with: CRNA  Anesthesia Plan Comments:        Anesthesia Quick Evaluation

## 2023-07-10 NOTE — Op Note (Signed)
 Date of Surgery: 07/10/2023 Admit Date: 07/10/2023 Performing Service: General Surgeons and Role:    Edmon Gosling, MD - Primary  Preoperative Diagnosis: Acute appendicitis  Postoperative Diagnosis: Acute appendicitis  Procedure(s) Performed:  - Diagnostic laparoscopy - Laparoscopic appendectomy  Surgeon:    Freddrick Jaffe, MD  Anesthesia:    General endotracheal.  Specimens:  Appendix  Estimated Blood Loss:   Minimal.  Indications for Surgery:   This is a 38 y.o. female  who presented to us  with a 1 week history of abdominal cramping and pain now localized to RLQ.  Operative Findings: inflamed appendix without evidence of perforation or abscess  Procedure:  After obtaining consent from the patient, the patient was taken to the operating room and laid supine on the operating table.  The patient was then placed under general endotracheal anesthesia.  No foley, patient voided on call to OR..  The anterior abdominal wall was prepped and draped in the usual standard fashion.  A 5 mm supraumbilical incision was made. The umbilical stalk was grasped with a kocher and a Veress needle was successfully entered into the peritoneal cavity and the peritoneal cavity was insufflated with carbon dioxide gas.  Upon adequate achievement of pneumoperitoneum, a 5 mm trocar was introduced in optiview fashion using a 5mm 30-degree scope. Diagnostic laparoscopy was performed.  There was no evidence of injury.  There was evidence of inflammation in the right lower quadrant.  A suprapubic 5 mm trocar was then placed in the usual standard fashion under direct vision.  A left lower quadrant 12 mm trocar was then placed under direct vision.  The patient was placed in a head down, left side down position.  The cecum was identified; this was lifted up.  The terminal ileum was identified.  The appendix came into full view.  The appendix was then grasped and retracted in a cephalad manner.  The mesoappendix was then  clearly visualized. Mesoappendix divided with harmonic scalpel.  A bowel load stapler was then fired across the base of the appendix. The appendix was then placed into an Endocatch bag. At this time, we then looked at the pelvis and thin reactive fluid was noted and suctioned. We then turned our attention back to the staple line and divided mesoappendix and both appeared intact and hemostatic. The appendix was then removed through the 12 mm port site with the trocar. The 12 mm trocar site fascia was closed using 0 Vicryl on a suture passer under direct visualization. The suprapubic trocar site was removed under direct vision.  The abdomen was desufflated and periumbilical port site removed.   Skin of all trocar sites were closed using 4-0 Monocryl in a subcuticular manner and Dermabond.  The patient was reversed from general endotracheal anesthesia, extubated and sent to PACU in stable condition.  Instrument, sponge, and needle counts were correct at closure and at the conclusion of the case.    Freddrick Jaffe, MD [facility]

## 2023-07-10 NOTE — ED Provider Notes (Signed)
 [place] EMERGENCY DEPARTMENT AT [facility] Provider Note   CSN: [id number] Arrival date & time: 07/10/23  4034     History  Chief Complaint  Patient presents with  . Fatigue    Sherri  [name] is a 38 y.o. female.  This is a 38 year old female presenting emergency department with multiple complaints. Complaining of right sided abdominal pain with nausea, but no vomiting. Also complaining of CP and SOB. Symptoms have been going on for the past 5 or so days.  Having some dysuria as well and notes some weight vaginal discharge.  She has low suspicion for STI.        Home Medications Prior to Admission medications   Medication Sig Start Date End Date Taking? Authorizing Provider  amLODipine (NORVASC) 5 MG tablet Take 1 tablet by mouth daily as needed (HBP). 08/11/22  Yes [provider]  azelastine  (ASTELIN ) 0.1 % nasal spray Place 1 spray into both nostrils 2 (two) times daily. Use in each nostril as directed Patient taking differently: Place 1 spray into both nostrils daily as needed for rhinitis or allergies. 05/16/23  Yes Reddick, Johnathan B, NP  benzonatate  (TESSALON ) 200 MG capsule Take 1 capsule (200 mg total) by mouth 3 (three) times daily as needed for cough. 05/16/23  Yes Reddick, Johnathan B, NP  clindamycin  (CLEOCIN  T) 1 % lotion Apply to cleansed face daily in the morning. Patient taking differently: Apply 1 Application topically as needed (breakouts). 06/30/21  Yes   cyclobenzaprine  (FLEXERIL ) 10 MG tablet Take 1 tablet (10 mg total) by mouth 3 (three) times daily as needed for muscle spasms. 06/10/23  Yes McDonald, Olive Better, DPM  EPINEPHrine  (EPIPEN  2-PAK) 0.3 mg/0.3 mL IJ SOAJ injection Inject 0.3 mg into the muscle as needed for anaphylaxis. 12/31/22  Yes Padgett, Rhoderick Ceo, MD  fluticasone  (FLONASE ) 50 MCG/ACT nasal spray Place 1 spray into both nostrils daily as needed for allergies or rhinitis. 04/20/23  Yes [provider]   gabapentin  (NEURONTIN ) 300 MG capsule Take 1 capsule (300 mg total) by mouth 3 (three) times daily for 7 days. Patient taking differently: Take 300 mg by mouth daily as needed (nerve pain). 06/04/23 07/10/23 Yes McDonald, Olive Better, DPM  hydrOXYzine  (ATARAX ) 25 MG tablet 25 mg at bedtime as needed (sleep). 08/11/22  Yes [provider]  levocetirizine (XYZAL ) 5 MG tablet TAKE 1 TABLET (5 MG TOTAL) BY MOUTH DAILY AS NEEDED FOR ALLERGIES (CAN TAKE AN EXTRA DOSE DURING FLARE UPS.). Patient taking differently: Take 5 mg by mouth daily as needed for allergies. 07/09/23  Yes Kozlow, Eric J, MD  lisinopril (ZESTRIL) 10 MG tablet Take 10 mg by mouth daily as needed. 04/22/23 07/21/23 Yes [provider]  medroxyPROGESTERone  (DEPO-PROVERA ) 150 MG/ML injection Inject 1 mL (150 mg total) into the muscle every 3 (three) months. 06/29/23  Yes Leftwich-Kirby, Darren Em, CNM  methocarbamol  (ROBAXIN ) 500 MG tablet Take 500 mg by mouth daily as needed for muscle spasms. 09/11/22  Yes [provider]  montelukast  (SINGULAIR ) 10 MG tablet Take 10 mg by mouth at bedtime as needed (allergies). 01/27/22  Yes [provider]  omeprazole  (PRILOSEC) 40 MG capsule Take 1 capsule (40 mg total) by mouth every morning. Patient taking differently: Take 40 mg by mouth daily as needed (reflux). 02/18/23  Yes Padgett, Rhoderick Ceo, MD  ondansetron  (ZOFRAN -ODT) 4 MG disintegrating tablet Take 4 mg by mouth in the morning, at noon, and at bedtime. 06/16/23  Yes [provider]  oxyCODONE  (OXY IR/ROXICODONE ) 5 MG immediate release tablet Take 5 mg by mouth daily as needed for severe pain (pain score 7-10).   Yes [provider]  rizatriptan  (MAXALT ) 10 MG tablet Take 1 tablet (10 mg total) by mouth as needed for migraine. May repeat in 2 hours if needed.  Maximum 2 tablets in 24 hours. 06/11/23  Yes Jaffe, Adam R, DO  valACYclovir  (VALTREX ) 1000 MG tablet Take 1,000 mg by mouth as needed  (Outbreaks). 02/03/22  Yes [provider]  Vitamin D , Ergocalciferol , (DRISDOL) 1.25 MG (50000 UNIT) CAPS capsule Take 50,000 Units by mouth every 7 (seven) days. 07/07/22  Yes [provider]  methylPREDNISolone  (MEDROL  DOSEPAK) 4 MG TBPK tablet Take 4 mg by mouth as directed. Patient not taking: Reported on 07/10/2023 05/13/23   [provider]  metroNIDAZOLE  (METROGEL ) 0.75 % vaginal gel Place 1 Applicatorful vaginally at bedtime. Apply one applicatorful to vagina at bedtime for 5 days Patient not taking: Reported on 07/10/2023 06/29/23   Leftwich-Kirby, Darren Em, CNM      Allergies    Macrobid  [nitrofurantoin ], Augmentin  [amoxicillin -pot clavulanate], Cefdinir , and Metformin  and related    Review of Systems   Review of Systems  Physical Exam Updated Vital Signs BP (!) 144/79 (BP Location: Left Arm)   Pulse 75   Temp 98.7 F (37.1 C) (Oral)   Resp 18   Ht 4\' 11"  (1.499 m)   Wt 87.5 kg   LMP 06/14/2023 (Approximate) Comment: negative HCG 06/17/23  BMI 38.98 kg/m  Physical Exam Vitals and nursing note reviewed.  Constitutional:      General: She is not in acute distress.    Appearance: She is obese. She is not toxic-appearing.  HENT:     Head: Normocephalic.     Mouth/Throat:     Mouth: Mucous membranes are moist.  Eyes:     Conjunctiva/sclera: Conjunctivae normal.  Cardiovascular:     Rate and Rhythm: Normal rate and regular rhythm.  Pulmonary:     Effort: Pulmonary effort is normal.     Breath sounds: Normal breath sounds.  Abdominal:     General: Abdomen is flat. There is no distension.     Tenderness: There is abdominal tenderness (rlq). There is no guarding.  Musculoskeletal:        General: Normal range of motion.  Skin:    General: Skin is warm and dry.     Capillary Refill: Capillary refill takes less than 2 seconds.  Neurological:     Mental Status: She is alert and oriented to person, place, and time.  Psychiatric:        Mood and  Affect: Mood normal.        Behavior: Behavior normal.     ED Results / Procedures / Treatments   Labs (all labs ordered are listed, but only abnormal results are displayed) Labs Reviewed  CBC - Abnormal; Notable for the following components:      Result Value   Hemoglobin 11.9 (*)    RDW 17.2 (*)    All other components within normal limits  URINALYSIS, W/ REFLEX TO CULTURE (INFECTION SUSPECTED) - Abnormal; Notable for the following components:   APPearance HAZY (*)    Bacteria, UA RARE (*)    All other components within normal limits  WET PREP, GENITAL  COMPREHENSIVE METABOLIC PANEL WITH GFR  LIPASE, BLOOD  HCG, QUANTITATIVE, PREGNANCY  TROPONIN I (HIGH SENSITIVITY)    EKG EKG Interpretation Date/Time:  Saturday Jul 10 2023  09:57:38 EDT Ventricular Rate:  66 PR Interval:  161 QRS Duration:  86 QT Interval:  409 QTC Calculation: 429 R Axis:   9  Text Interpretation: Sinus rhythm Left ventricular hypertrophy Confirmed by Elise Guile [phone]) on 07/10/2023 10:13:09 AM  Radiology CT ABDOMEN PELVIS W CONTRAST Result Date: 07/10/2023 CLINICAL DATA:  Right lower quadrant abdominal pain. EXAM: CT ABDOMEN AND PELVIS WITH CONTRAST TECHNIQUE: Multidetector CT imaging of the abdomen and pelvis was performed using the standard protocol following bolus administration of intravenous contrast. RADIATION DOSE REDUCTION: This exam was performed according to the departmental dose-optimization program which includes automated exposure control, adjustment of the mA and/or kV according to patient size and/or use of iterative reconstruction technique. CONTRAST:  OMNIPAQUE  IOHEXOL  300 MG/ML  SOLN COMPARISON:  06/18/2023 FINDINGS: Lower chest: No acute abnormality. Hepatobiliary: No focal liver abnormality is seen. No gallstones, gallbladder wall thickening, or biliary dilatation. Pancreas: Unremarkable. No pancreatic ductal dilatation or surrounding inflammatory changes. Spleen: Normal in size  without focal abnormality. Adrenals/Urinary Tract: Normal adrenal glands. No nephrolithiasis, hydronephrosis or suspicious mass. Urinary bladder appears normal. Stomach/Bowel: Stomach is within normal limits. The appendix is thickened with surrounding inflammatory changes compatible with acute appendicitis. The appendix measures 1 cm in diameter. There is no pathologic dilatation of the large or small bowel loops. No bowel wall thickening or inflammation. Vascular/Lymphatic: Normal appearance of the abdominal aorta. No signs abdominopelvic adenopathy. Prominent right lower quadrant ileocolic lymph nodes are likely reactive. Reproductive: Uterus and bilateral adnexa are unremarkable. Other: No abdominal wall hernia or abnormality. No abdominopelvic ascites. No focal fluid collections to suggest abscess. Musculoskeletal: No acute or significant osseous findings. IMPRESSION: Acute appendicitis. No evidence for abscess or perforation. Electronically Signed   By: Kimberley Penman M.D.   On: 07/10/2023 12:49   DG Chest Portable 1 View Result Date: 07/10/2023 CLINICAL DATA:  Chest pain. EXAM: PORTABLE CHEST 1 VIEW COMPARISON:  08/07/2022 FINDINGS: Stable heart size allowing for differences in technique. Mediastinal contours are unchanged. The lungs are clear. Pulmonary vasculature is normal. No consolidation, pleural effusion, or pneumothorax. No acute osseous abnormalities are seen. IMPRESSION: No active disease. Electronically Signed   By: Chadwick Colonel M.D.   On: 07/10/2023 10:10    Procedures Procedures    Medications Ordered in ED Medications  piperacillin-tazobactam (ZOSYN) IVPB 3.375 g (3.375 g Intravenous New Bag/Given 07/10/23 1442)  morphine  (PF) 2 MG/ML injection 2 mg (2 mg Intravenous Given 07/10/23 1442)  ondansetron  (ZOFRAN ) injection 4 mg (4 mg Intravenous Given 07/10/23 0919)  sodium chloride  0.9 % bolus 1,000 mL (0 mLs Intravenous Stopped 07/10/23 1434)  iohexol  (OMNIPAQUE ) 300 MG/ML solution  100 mL (100 mLs Intravenous Contrast Given 07/10/23 1125)    ED Course/ Medical Decision Making/ A&P Clinical Course as of 07/10/23 1453  Sat Jul 10, 2023  1008 DG Chest Portable 1 View No pneumonia.  No pneumothorax on my independent review of images. [TY]  1013 CBC(!) No leukocytosis to suggest infectious process.  Borderline anemia, but appears similar to recent labs [TY]  1013 Comprehensive metabolic panel No electrolyte abnormalities.  Normal kidney function.  No transaminitis to suggest hepatobiliary disease. [TY]  1013 Lipase, blood Pancreatitis unlikely [TY]  1014 Bacteria, UA(!): RARE [TY]  1014 DG Chest Portable 1 View IMPRESSION: No active disease.   [TY]  1255 CT ABDOMEN PELVIS W CONTRAST IMPRESSION: Acute appendicitis. No evidence for abscess or perforation.   [TY]  1300 Spoke with general surgery, Dr. Ramiro Burly regarding CT scan findings.  They will evaluate patient and likely take the OR today. [TY]    Clinical Course User Index [TY] Rolinda Climes, DO                                 Medical Decision Making This is a 38 year old female with history of fibromyalgia, diabetes, hypertension, uterine fibroids and urinary tract infections presenting emergency department with multiple complaints.  She is afebrile nontachycardic, slightly hypertensive.  Likely reassuring exam, but did have right lower quadrant abdominal tenderness.  She is complaining of some chest pain and tightness will get cardiopulmonary workup.  Will also get CT scan to evaluate for appendicitis given patient's tenderness on exam.  Having dysuria, will get UA and wet prep.  Treated with IV fluids, Zofran .  See ED course for further MDM and disposition.  Amount and/or Complexity of Data Reviewed External Data Reviewed: radiology.    Details: Did have CT scan 06/18/2023 that was normal. Labs: ordered. Decision-making details documented in ED Course. Radiology: ordered. Decision-making details documented  in ED Course. ECG/medicine tests: ordered.  Risk Prescription drug management. Decision regarding hospitalization. Diagnosis or treatment significantly limited by social determinants of health.          Final Clinical Impression(s) / ED Diagnoses Final diagnoses:  None    Rx / DC Orders ED Discharge Orders     None         Rolinda Climes, DO 07/10/23 1446

## 2023-07-10 NOTE — Progress Notes (Signed)
 Verbal order by MD Ramiro Burly for one time order of 1 mg IV Dilaudid  now. And if needed later throughout the night another 1 mg IV Dilaudid  can be ordered.

## 2023-07-10 NOTE — Plan of Care (Signed)
   Problem: Education: Goal: Knowledge of General Education information will improve Description Including pain rating scale, medication(s)/side effects and non-pharmacologic comfort measures Outcome: Progressing   Problem: Health Behavior/Discharge Planning: Goal: Ability to manage health-related needs will improve Outcome: Progressing

## 2023-07-10 NOTE — ED Triage Notes (Signed)
 Pt to er, pt states that for the past week she has been feeling poorly, states that she has had dry mouth, short of breath, chest pain, flank pain, and fatigue.  States that nothing seems to have started her feeling poorly.  States that she had surgery on her foot April 4th,

## 2023-07-10 NOTE — Transfer of Care (Signed)
 Immediate Anesthesia Transfer of Care Note  Patient: Sherri [name]  Procedure(s) Performed: APPENDECTOMY, LAPAROSCOPIC  Patient Location: PACU  Anesthesia Type:General  Level of Consciousness: awake, alert , and oriented  Airway & Oxygen Therapy: Patient Spontanous Breathing  Post-op Assessment: Report given to RN and Post -op Vital signs reviewed and stable  Post vital signs: Reviewed and stable  Last Vitals:  Vitals Value Taken Time  BP 139/82 07/10/23 1909  Temp 36.8 C 07/10/23 1909  Pulse 92 07/10/23 1912  Resp 18 07/10/23 1912  SpO2 95 % 07/10/23 1912  Vitals shown include unfiled device data.  Last Pain:  Vitals:   07/10/23 1439  TempSrc: Oral  PainSc:          Complications: No notable events documented.

## 2023-07-11 ENCOUNTER — Encounter (HOSPITAL_COMMUNITY): Payer: Self-pay | Admitting: General Surgery

## 2023-07-11 ENCOUNTER — Other Ambulatory Visit: Payer: Self-pay

## 2023-07-11 LAB — HIV ANTIBODY (ROUTINE TESTING W REFLEX): HIV Screen 4th Generation wRfx: NONREACTIVE

## 2023-07-11 MED ORDER — HYDROMORPHONE HCL 1 MG/ML IJ SOLN
1.0000 mg | Freq: Once | INTRAMUSCULAR | Status: AC
  Administered 2023-07-11: 1 mg via INTRAVENOUS
  Filled 2023-07-11: qty 1

## 2023-07-11 MED ORDER — KETOROLAC TROMETHAMINE 30 MG/ML IJ SOLN
30.0000 mg | Freq: Three times a day (TID) | INTRAMUSCULAR | Status: DC | PRN
  Administered 2023-07-11 – 2023-07-13 (×3): 30 mg via INTRAVENOUS
  Filled 2023-07-11 (×3): qty 1

## 2023-07-11 MED ORDER — ONDANSETRON HCL 4 MG/2ML IJ SOLN
4.0000 mg | Freq: Once | INTRAMUSCULAR | Status: AC
  Administered 2023-07-11: 4 mg via INTRAVENOUS
  Filled 2023-07-11: qty 2

## 2023-07-11 NOTE — Plan of Care (Signed)
 Continues to have 6/10 abdominal pain even with medications. Educated on increasing mobility slowly. Pt verbalized understanding Problem: Education: Goal: Knowledge of General Education information will improve Description: Including pain rating scale, medication(s)/side effects and non-pharmacologic comfort measures Outcome: Progressing   Problem: Health Behavior/Discharge Planning: Goal: Ability to manage health-related needs will improve Outcome: Progressing   Problem: Clinical Measurements: Goal: Ability to maintain clinical measurements within normal limits will improve Outcome: Progressing Goal: Will remain free from infection Outcome: Progressing Goal: Diagnostic test results will improve Outcome: Progressing Goal: Respiratory complications will improve Outcome: Progressing Goal: Cardiovascular complication will be avoided Outcome: Progressing   Problem: Activity: Goal: Risk for activity intolerance will decrease Outcome: Progressing   Problem: Nutrition: Goal: Adequate nutrition will be maintained Outcome: Progressing   Problem: Coping: Goal: Level of anxiety will decrease Outcome: Progressing   Problem: Elimination: Goal: Will not experience complications related to bowel motility Outcome: Progressing Goal: Will not experience complications related to urinary retention Outcome: Progressing   Problem: Pain Managment: Goal: General experience of comfort will improve and/or be controlled Outcome: Progressing   Problem: Safety: Goal: Ability to remain free from injury will improve Outcome: Progressing   Problem: Skin Integrity: Goal: Risk for impaired skin integrity will decrease Outcome: Progressing

## 2023-07-11 NOTE — Progress Notes (Signed)
 1 Day Post-Op   Subjective/Chief Complaint: Complains of nausea and soreness   Objective: Vital signs in last 24 hours: Temp:  [97.7 F (36.5 C)-98.7 F (37.1 C)] 98.2 F (36.8 C) (05/11 0407) Pulse Rate:  [66-102] 91 (05/11 0407) Resp:  [15-23] 20 (05/11 0407) BP: (132-170)/(67-106) 137/82 (05/11 0407) SpO2:  [94 %-100 %] 97 % (05/11 0407) Weight:  [87.5 kg] 87.5 kg (05/10 0830) Last BM Date : 07/09/23  Intake/Output from previous day: 05/10 0701 - 05/11 0700 In: 1890 [P.O.:390; I.V.:500; IV Piggyback:1000] Out: 1365 [Urine:1350; Blood:15] Intake/Output this shift: No intake/output data recorded.  General appearance: alert and cooperative Resp: clear to auscultation bilaterally Cardio: regular rate and rhythm GI: soft, mild tenderness. Incisions look good  Lab Results:  Recent Labs    07/10/23 0921  WBC 7.0  HGB 11.9*  HCT 37.2  PLT 282   BMET Recent Labs    07/10/23 0921  NA 140  K 3.5  CL 111  CO2 22  GLUCOSE 99  BUN 8  CREATININE 0.96  CALCIUM  9.4   PT/INR No results for input(s): "LABPROT", "INR" in the last 72 hours. ABG No results for input(s): "PHART", "HCO3" in the last 72 hours.  Invalid input(s): "PCO2", "PO2"  Studies/Results: CT ABDOMEN PELVIS W CONTRAST Result Date: 07/10/2023 CLINICAL DATA:  Right lower quadrant abdominal pain. EXAM: CT ABDOMEN AND PELVIS WITH CONTRAST TECHNIQUE: Multidetector CT imaging of the abdomen and pelvis was performed using the standard protocol following bolus administration of intravenous contrast. RADIATION DOSE REDUCTION: This exam was performed according to the departmental dose-optimization program which includes automated exposure control, adjustment of the mA and/or kV according to patient size and/or use of iterative reconstruction technique. CONTRAST:  OMNIPAQUE  IOHEXOL  300 MG/ML  SOLN COMPARISON:  06/18/2023 FINDINGS: Lower chest: No acute abnormality. Hepatobiliary: No focal liver abnormality is  seen. No gallstones, gallbladder wall thickening, or biliary dilatation. Pancreas: Unremarkable. No pancreatic ductal dilatation or surrounding inflammatory changes. Spleen: Normal in size without focal abnormality. Adrenals/Urinary Tract: Normal adrenal glands. No nephrolithiasis, hydronephrosis or suspicious mass. Urinary bladder appears normal. Stomach/Bowel: Stomach is within normal limits. The appendix is thickened with surrounding inflammatory changes compatible with acute appendicitis. The appendix measures 1 cm in diameter. There is no pathologic dilatation of the large or small bowel loops. No bowel wall thickening or inflammation. Vascular/Lymphatic: Normal appearance of the abdominal aorta. No signs abdominopelvic adenopathy. Prominent right lower quadrant ileocolic lymph nodes are likely reactive. Reproductive: Uterus and bilateral adnexa are unremarkable. Other: No abdominal wall hernia or abnormality. No abdominopelvic ascites. No focal fluid collections to suggest abscess. Musculoskeletal: No acute or significant osseous findings. IMPRESSION: Acute appendicitis. No evidence for abscess or perforation. Electronically Signed   By: Kimberley Penman M.D.   On: 07/10/2023 12:49   DG Chest Portable 1 View Result Date: 07/10/2023 CLINICAL DATA:  Chest pain. EXAM: PORTABLE CHEST 1 VIEW COMPARISON:  08/07/2022 FINDINGS: Stable heart size allowing for differences in technique. Mediastinal contours are unchanged. The lungs are clear. Pulmonary vasculature is normal. No consolidation, pleural effusion, or pneumothorax. No acute osseous abnormalities are seen. IMPRESSION: No active disease. Electronically Signed   By: Chadwick Colonel M.D.   On: 07/10/2023 10:10    Anti-infectives: Anti-infectives (From admission, onward)    Start     Dose/Rate Route Frequency Ordered Stop   07/10/23 1400  piperacillin-tazobactam (ZOSYN) IVPB 3.375 g  Status:  Discontinued        3.375 g 100 mL/hr over 30  Minutes  Intravenous Every 8 hours 07/10/23 1301 07/10/23 1305   07/10/23 1400  piperacillin-tazobactam (ZOSYN) IVPB 3.375 g  Status:  Discontinued        3.375 g 12.5 mL/hr over 240 Minutes Intravenous Every 8 hours 07/10/23 1305 07/10/23 1917       Assessment/Plan: s/p Procedure(s): APPENDECTOMY, LAPAROSCOPIC (N/A) Ileus slowly improving Advance diet as tolerated Ambulate POD 1 Add toradol  for pain Hopefully ready for d/c tomorrow  LOS: 0 days    Lillette Reid III 07/11/2023

## 2023-07-11 NOTE — Progress Notes (Signed)
 MD Ramiro Burly was notified that patient is complaining of nausea. IV PRN Zofran  q6h was given at 0214.

## 2023-07-11 NOTE — Anesthesia Postprocedure Evaluation (Signed)
 Anesthesia Post Note  Patient: Sherri [name]  Procedure(s) Performed: APPENDECTOMY, LAPAROSCOPIC (Abdomen)     Patient location during evaluation: PACU Anesthesia Type: General Level of consciousness: awake and alert Pain management: pain level controlled Vital Signs Assessment: post-procedure vital signs reviewed and stable Respiratory status: spontaneous breathing, nonlabored ventilation, respiratory function stable and patient connected to nasal cannula oxygen Cardiovascular status: blood pressure returned to baseline and stable Postop Assessment: no apparent nausea or vomiting Anesthetic complications: no   No notable events documented.  Last Vitals:    Last Pain:                 [name]

## 2023-07-12 LAB — BASIC METABOLIC PANEL WITH GFR
Anion gap: 12 (ref 5–15)
BUN: 14 mg/dL (ref 6–20)
CO2: 20 mmol/L — ABNORMAL LOW (ref 22–32)
Calcium: 8.9 mg/dL (ref 8.9–10.3)
Chloride: 107 mmol/L (ref 98–111)
Creatinine, Ser: 0.95 mg/dL (ref 0.44–1.00)
GFR, Estimated: >60 mL/min (ref >60)
Glucose, Bld: 127 mg/dL — ABNORMAL HIGH (ref 70–99)
Potassium: 3.2 mmol/L — ABNORMAL LOW (ref 3.5–5.1)
Sodium: 139 mmol/L (ref 135–145)

## 2023-07-12 LAB — MAGNESIUM: Magnesium: 2.6 mg/dL — ABNORMAL HIGH (ref 1.7–2.4)

## 2023-07-12 MED ORDER — POLYETHYLENE GLYCOL 3350 17 G PO PACK
17.0000 g | PACK | Freq: Every day | ORAL | Status: DC
  Administered 2023-07-12 – 2023-07-13 (×2): 17 g via ORAL
  Filled 2023-07-12 (×2): qty 1

## 2023-07-12 MED ORDER — POTASSIUM CHLORIDE CRYS ER 20 MEQ PO TBCR
40.0000 meq | EXTENDED_RELEASE_TABLET | Freq: Two times a day (BID) | ORAL | Status: AC
Start: 2023-07-12 — End: 2023-07-12
  Administered 2023-07-12 (×2): 40 meq via ORAL
  Filled 2023-07-12 (×2): qty 2

## 2023-07-12 NOTE — Plan of Care (Signed)

## 2023-07-12 NOTE — Progress Notes (Signed)
   07/12/23 1024  TOC Brief Assessment  Insurance and Status Reviewed  Patient has primary care physician Yes  Home environment has been reviewed Apartment  Prior level of function: Independent  Prior/Current Home Services No current home services  Readmission risk has been reviewed Yes (NA)  Transition of care needs no transition of care needs at this time   Pt from home. No SDOH needs, DME needs or HH needs. There are no other TOC needs at this time.

## 2023-07-12 NOTE — Discharge Instructions (Signed)

## 2023-07-12 NOTE — Progress Notes (Signed)
 Progress Note  2 Days Post-Op  Subjective: Pt reports she is having some left sided abdominal pain. No flatus or BM. Some nausea but eating and no vomiting. Walking to bathroom but then getting back in bed, discussed importance of mobilizing. Pt did have recent surgery on R foot. Boot present in room.   Objective: Vital signs in last 24 hours: Temp:  [98 F (36.7 C)-98.4 F (36.9 C)] 98 F (36.7 C) (05/12 0659) Pulse Rate:  [75-97] 75 (05/12 0659) Resp:  [17-20] 18 (05/12 0659) BP: (142-149)/(84-96) 149/94 (05/12 0659) SpO2:  [95 %-100 %] 100 % (05/12 0659) Last BM Date : 07/09/23  Intake/Output from previous day: 05/11 0701 - 05/12 0700 In: 1040 [P.O.:1040] Out: 200 [Urine:200] Intake/Output this shift: No intake/output data recorded.  PE: General: pleasant, WD, overweight female who is laying in bed in NAD Heart: regular, rate, and rhythm.  Lungs: Respiratory effort nonlabored Abd: soft, appropriately ttp, mild distention, incisions C/D/I, small amount ecchymosis around LLQ incision  Psych: A&Ox3 with an appropriate affect.    Lab Results:  Recent Labs    07/10/23 0921  WBC 7.0  HGB 11.9*  HCT 37.2  PLT 282   BMET Recent Labs    07/10/23 0921  NA 140  K 3.5  CL 111  CO2 22  GLUCOSE 99  BUN 8  CREATININE 0.96  CALCIUM  9.4   PT/INR No results for input(s): "LABPROT", "INR" in the last 72 hours. CMP     Component Value Date/Time   NA 140 07/10/2023 0921   NA 139 01/21/2021 1525   K 3.5 07/10/2023 0921   CL 111 07/10/2023 0921   CO2 22 07/10/2023 0921   GLUCOSE 99 07/10/2023 0921   BUN 8 07/10/2023 0921   BUN 10 01/21/2021 1525   CREATININE 0.96 07/10/2023 0921   CREATININE 0.65 12/19/2010 1030   CALCIUM  9.4 07/10/2023 0921   PROT 7.9 07/10/2023 0921   PROT 7.3 01/21/2021 1525   ALBUMIN 4.2 07/10/2023 0921   ALBUMIN 4.6 01/21/2021 1525   AST 18 07/10/2023 0921   ALT 16 07/10/2023 0921   ALKPHOS 70 07/10/2023 0921   BILITOT 0.4 07/10/2023  0921   BILITOT <0.2 01/21/2021 1525   GFRNONAA >60 07/10/2023 0921   GFRAA 107 08/10/2019 1450   Lipase     Component Value Date/Time   LIPASE 31 07/10/2023 0921       Studies/Results: CT ABDOMEN PELVIS W CONTRAST Result Date: 07/10/2023 CLINICAL DATA:  Right lower quadrant abdominal pain. EXAM: CT ABDOMEN AND PELVIS WITH CONTRAST TECHNIQUE: Multidetector CT imaging of the abdomen and pelvis was performed using the standard protocol following bolus administration of intravenous contrast. RADIATION DOSE REDUCTION: This exam was performed according to the departmental dose-optimization program which includes automated exposure control, adjustment of the mA and/or kV according to patient size and/or use of iterative reconstruction technique. CONTRAST:  OMNIPAQUE  IOHEXOL  300 MG/ML  SOLN COMPARISON:  06/18/2023 FINDINGS: Lower chest: No acute abnormality. Hepatobiliary: No focal liver abnormality is seen. No gallstones, gallbladder wall thickening, or biliary dilatation. Pancreas: Unremarkable. No pancreatic ductal dilatation or surrounding inflammatory changes. Spleen: Normal in size without focal abnormality. Adrenals/Urinary Tract: Normal adrenal glands. No nephrolithiasis, hydronephrosis or suspicious mass. Urinary bladder appears normal. Stomach/Bowel: Stomach is within normal limits. The appendix is thickened with surrounding inflammatory changes compatible with acute appendicitis. The appendix measures 1 cm in diameter. There is no pathologic dilatation of the large or small bowel loops. No bowel wall thickening  or inflammation. Vascular/Lymphatic: Normal appearance of the abdominal aorta. No signs abdominopelvic adenopathy. Prominent right lower quadrant ileocolic lymph nodes are likely reactive. Reproductive: Uterus and bilateral adnexa are unremarkable. Other: No abdominal wall hernia or abnormality. No abdominopelvic ascites. No focal fluid collections to suggest abscess. Musculoskeletal:  No acute or significant osseous findings. IMPRESSION: Acute appendicitis. No evidence for abscess or perforation. Electronically Signed   By: Kimberley Penman M.D.   On: 07/10/2023 12:49   DG Chest Portable 1 View Result Date: 07/10/2023 CLINICAL DATA:  Chest pain. EXAM: PORTABLE CHEST 1 VIEW COMPARISON:  08/07/2022 FINDINGS: Stable heart size allowing for differences in technique. Mediastinal contours are unchanged. The lungs are clear. Pulmonary vasculature is normal. No consolidation, pleural effusion, or pneumothorax. No acute osseous abnormalities are seen. IMPRESSION: No active disease. Electronically Signed   By: Chadwick Colonel M.D.   On: 07/10/2023 10:10    Anti-infectives: Anti-infectives (From admission, onward)    Start     Dose/Rate Route Frequency Ordered Stop   07/10/23 1400  piperacillin-tazobactam (ZOSYN) IVPB 3.375 g  Status:  Discontinued        3.375 g 100 mL/hr over 30 Minutes Intravenous Every 8 hours 07/10/23 1301 07/10/23 1305   07/10/23 1400  piperacillin-tazobactam (ZOSYN) IVPB 3.375 g  Status:  Discontinued        3.375 g 12.5 mL/hr over 240 Minutes Intravenous Every 8 hours 07/10/23 1305 07/10/23 1917        Assessment/Plan  POD2 S/p laparoscopic appendectomy   - no flatus or BM yet - encouraged mobilization  - check BMET and Mg level  - incisions C/D/I - possible DC later today vs tomorrow AM pending improvement in pain and bowel function   FEN: reg diet, SLIV VTE: LMWH ID: Zosyn 5/10  LOS: 0 days    Annetta Killian, [facility] 07/12/2023, 8:23 AM Please see Amion for pager number during day hours 7:00am-4:30pm

## 2023-07-12 NOTE — Discharge Summary (Signed)
 [facility] Discharge Summary   Patient ID: Sherri [name] MRN: [id number] DOB/AGE: 37-Dec-1987 37 y.o.  Admit date: 07/10/2023 Discharge date: 07/13/2023  Admitting Diagnosis: Acute appendicitis   Discharge Diagnosis S/P laparoscopic appendectomy   Consultants None   Imaging: No results found.  Procedures Dr. Edmon Gosling (07/10/23) - Laparoscopic Appendectomy  Hospital Course:  Patient is a 38 year old female who presented to the ED with abdominal pain.  Workup showed acute appendicitis.  Patient was admitted and underwent procedure listed above.  Tolerated procedure well and was transferred to the floor.  Diet was advanced as tolerated. Patient hospital course complicated slightly by refusal to mobilize.  On POD3, the patient was voiding well, tolerating diet, ambulating well, pain well controlled, vital signs stable, incisions c/d/i and felt stable for discharge home.  Patient will follow up in our office in 3 weeks and knows to call with questions or concerns.   Physical Exam: General:  Alert, NAD, pleasant, comfortable Abd:  Soft, ND, mild tenderness, incisions C/D/I    I or a member of my team have reviewed this patient in the Controlled Substance Database.   Allergies as of 07/13/2023       Reactions   Macrobid  [nitrofurantoin ] Nausea And Vomiting   Augmentin  [amoxicillin -pot Clavulanate]    Pt states it makes her sick   Cefdinir  Itching   Metformin  And Related Nausea And Vomiting        Medication List     STOP taking these medications    methocarbamol  500 MG tablet Commonly known as: ROBAXIN    methylPREDNISolone  4 MG Tbpk tablet Commonly known as: MEDROL  DOSEPAK   metroNIDAZOLE  0.75 % vaginal gel Commonly known as: METROGEL    ondansetron  4 MG disintegrating tablet Commonly known as: ZOFRAN -ODT   oxyCODONE  5 MG immediate release tablet Commonly known as: Oxy IR/ROXICODONE        TAKE these medications    acetaminophen  500 MG  tablet Commonly known as: TYLENOL  Take 2 tablets (1,000 mg total) by mouth every 6 (six) hours as needed for mild pain (pain score 1-3), fever or headache.   amLODipine 5 MG tablet Commonly known as: NORVASC Take 1 tablet by mouth daily as needed (HBP).   azelastine  0.1 % nasal spray Commonly known as: ASTELIN  Place 1 spray into both nostrils 2 (two) times daily. Use in each nostril as directed What changed:  when to take this reasons to take this additional instructions   benzonatate  200 MG capsule Commonly known as: TESSALON  Take 1 capsule (200 mg total) by mouth 3 (three) times daily as needed for cough.   clindamycin  1 % lotion Commonly known as: CLEOCIN  T Apply to cleansed face daily in the morning. What changed:  how much to take when to take this reasons to take this   cyclobenzaprine  10 MG tablet Commonly known as: FLEXERIL  Take 1 tablet (10 mg total) by mouth 3 (three) times daily as needed for muscle spasms.   EPINEPHrine  0.3 mg/0.3 mL Soaj injection Commonly known as: EpiPen  2-Pak Inject 0.3 mg into the muscle as needed for anaphylaxis.   fluticasone  50 MCG/ACT nasal spray Commonly known as: FLONASE  Place 1 spray into both nostrils daily as needed for allergies or rhinitis.   gabapentin  300 MG capsule Commonly known as: NEURONTIN  Take 1 capsule (300 mg total) by mouth 3 (three) times daily for 7 days. What changed:  when to take this reasons to take this   hydrOXYzine  25 MG tablet Commonly known as: ATARAX   25 mg at bedtime as needed (sleep).   ibuprofen  600 MG tablet Commonly known as: ADVIL  Take 1 tablet (600 mg total) by mouth 3 (three) times daily. TAKE WITH FOOD   levocetirizine 5 MG tablet Commonly known as: XYZAL  TAKE 1 TABLET (5 MG TOTAL) BY MOUTH DAILY AS NEEDED FOR ALLERGIES (CAN TAKE AN EXTRA DOSE DURING FLARE UPS.). What changed: See the new instructions.   lisinopril 10 MG tablet Commonly known as: ZESTRIL Take 10 mg by mouth daily  as needed.   medroxyPROGESTERone  150 MG/ML injection Commonly known as: DEPO-PROVERA  Inject 1 mL (150 mg total) into the muscle every 3 (three) months.   montelukast  10 MG tablet Commonly known as: SINGULAIR  Take 10 mg by mouth at bedtime as needed (allergies).   omeprazole  40 MG capsule Commonly known as: PRILOSEC Take 1 capsule (40 mg total) by mouth every morning. What changed:  when to take this reasons to take this   polyethylene glycol 17 g packet Commonly known as: MIRALAX  / GLYCOLAX  Take 17 g by mouth daily as needed for mild constipation.   rizatriptan  10 MG tablet Commonly known as: Maxalt  Take 1 tablet (10 mg total) by mouth as needed for migraine. May repeat in 2 hours if needed.  Maximum 2 tablets in 24 hours.   traMADol  50 MG tablet Commonly known as: ULTRAM  Take 1-2 tablets (50-100 mg total) by mouth every 6 (six) hours as needed for moderate pain (pain score 4-6) or severe pain (pain score 7-10) (50 mg for moderate, 100 mg for severe).   valACYclovir  1000 MG tablet Commonly known as: VALTREX  Take 1,000 mg by mouth as needed (Outbreaks).   Vitamin D  (Ergocalciferol ) 1.25 MG (50000 UNIT) Caps capsule Commonly known as: DRISDOL Take 50,000 Units by mouth every 7 (seven) days.          Follow-up Information     Maczis, Puja Gosai, PA-C Follow up.   Specialty: General Surgery Contact information: [address] [place] [address] [facility] [place] [address] [phone]                 Signed: Annetta Killian , [facility] 07/13/2023, 9:35 AM Please see Amion for pager number during day hours 7:00am-4:30pm

## 2023-07-13 ENCOUNTER — Other Ambulatory Visit: Payer: Self-pay

## 2023-07-13 ENCOUNTER — Other Ambulatory Visit (HOSPITAL_COMMUNITY): Payer: Self-pay

## 2023-07-13 ENCOUNTER — Ambulatory Visit

## 2023-07-13 LAB — SURGICAL PATHOLOGY

## 2023-07-13 MED ORDER — TRAMADOL HCL 50 MG PO TABS
50.0000 mg | ORAL_TABLET | Freq: Four times a day (QID) | ORAL | 0 refills | Status: DC | PRN
  Filled 2023-07-13 (×2): qty 30, 4d supply, fill #0

## 2023-07-13 MED ORDER — IBUPROFEN 400 MG PO TABS: 600.0000 mg | ORAL_TABLET | Freq: Three times a day (TID) | ORAL | Status: DC

## 2023-07-13 MED ORDER — TRAMADOL HCL 50 MG PO TABS: 50.0000 mg | ORAL_TABLET | Freq: Four times a day (QID) | ORAL | Status: DC | PRN

## 2023-07-13 MED ORDER — IBUPROFEN 600 MG PO TABS
600.0000 mg | ORAL_TABLET | Freq: Three times a day (TID) | ORAL | 0 refills | Status: DC
  Filled 2023-07-13 (×2): qty 30, 10d supply, fill #0

## 2023-07-13 MED ORDER — ACETAMINOPHEN 500 MG PO TABS: 1000.0000 mg | ORAL_TABLET | Freq: Four times a day (QID) | ORAL | Status: DC | PRN

## 2023-07-13 MED ORDER — POLYETHYLENE GLYCOL 3350 17 G PO PACK: 17.0000 g | PACK | Freq: Every day | ORAL | Status: AC | PRN

## 2023-07-13 NOTE — Progress Notes (Signed)
 Discharge instructions given to patient and all questions were answered.

## 2023-07-13 NOTE — Progress Notes (Signed)
 TOC meds in a secure bag delivered to room by this RN. Pt has ambulation concerns due to recent foot surgery, ride waiver signed for cab voucher per pt

## 2023-07-13 NOTE — Plan of Care (Signed)
   Problem: Education: Goal: Knowledge of General Education information will improve Description Including pain rating scale, medication(s)/side effects and non-pharmacologic comfort measures Outcome: Progressing   Problem: Health Behavior/Discharge Planning: Goal: Ability to manage health-related needs will improve Outcome: Progressing

## 2023-07-15 ENCOUNTER — Other Ambulatory Visit: Payer: Self-pay

## 2023-07-15 ENCOUNTER — Other Ambulatory Visit (HOSPITAL_COMMUNITY): Payer: Self-pay

## 2023-07-15 ENCOUNTER — Other Ambulatory Visit: Payer: Self-pay | Admitting: Podiatry

## 2023-07-15 ENCOUNTER — Other Ambulatory Visit: Payer: Self-pay | Admitting: Allergy and Immunology

## 2023-07-15 ENCOUNTER — Emergency Department (HOSPITAL_COMMUNITY)
Admission: EM | Admit: 2023-07-15 | Discharge: 2023-07-16 | Disposition: A | Discharge status: Home / Self Care | Attending: Emergency Medicine | Admitting: Emergency Medicine

## 2023-07-15 ENCOUNTER — Encounter (HOSPITAL_COMMUNITY): Payer: Self-pay

## 2023-07-15 ENCOUNTER — Encounter: Payer: Medicaid Other | Admitting: Podiatry

## 2023-07-15 DIAGNOSIS — R1031 Right lower quadrant pain: Secondary | ICD-10-CM | POA: Diagnosis not present

## 2023-07-15 DIAGNOSIS — R1012 Left upper quadrant pain: Secondary | ICD-10-CM | POA: Diagnosis present

## 2023-07-15 DIAGNOSIS — R1032 Left lower quadrant pain: Secondary | ICD-10-CM | POA: Diagnosis not present

## 2023-07-15 MED ORDER — CYCLOBENZAPRINE HCL 10 MG PO TABS
10.0000 mg | ORAL_TABLET | Freq: Three times a day (TID) | ORAL | 0 refills | Status: AC | PRN
  Filled 2023-07-15 – 2023-07-21 (×4): qty 30, 10d supply, fill #0

## 2023-07-15 MED ORDER — LEVOCETIRIZINE DIHYDROCHLORIDE 5 MG PO TABS
ORAL_TABLET | ORAL | 0 refills | Status: DC
  Filled 2023-07-15 – 2023-08-01 (×2): qty 180, 90d supply, fill #0

## 2023-07-15 NOTE — ED Triage Notes (Signed)
 BIB EMS from home for pain under incision site, pt had an appendectomy last Saturday. Pt has been alternating tylenol  and ibuprofen  without relief. 4mg  of zofran  and 100mcg of fentanyl  given with EMS. 20g established in R AC. Pt endorses nausea. Site is not hot, red or irritated.

## 2023-07-16 ENCOUNTER — Other Ambulatory Visit (HOSPITAL_COMMUNITY): Payer: Self-pay

## 2023-07-16 ENCOUNTER — Ambulatory Visit: Admitting: Obstetrics & Gynecology

## 2023-07-16 ENCOUNTER — Emergency Department (HOSPITAL_COMMUNITY)

## 2023-07-16 ENCOUNTER — Ambulatory Visit: Admitting: Physical Therapy

## 2023-07-16 LAB — CBC WITH DIFFERENTIAL/PLATELET
Abs Immature Granulocytes: 0.02 10*3/uL (ref 0.00–0.07)
Basophils Absolute: 0 10*3/uL (ref 0.0–0.1)
Basophils Relative: 1 %
Eosinophils Absolute: 0.3 10*3/uL (ref 0.0–0.5)
Eosinophils Relative: 5 %
HCT: 34 % — ABNORMAL LOW (ref 36.0–46.0)
Hemoglobin: 10.8 g/dL — ABNORMAL LOW (ref 12.0–15.0)
Immature Granulocytes: 0 %
Lymphocytes Relative: 37 %
Lymphs Abs: 2.8 10*3/uL (ref 0.7–4.0)
MCH: 27.1 pg (ref 26.0–34.0)
MCHC: 31.8 g/dL (ref 30.0–36.0)
MCV: 85.2 fL (ref 80.0–100.0)
Monocytes Absolute: 0.5 10*3/uL (ref 0.1–1.0)
Monocytes Relative: 7 %
Neutro Abs: 3.9 10*3/uL (ref 1.7–7.7)
Neutrophils Relative %: 50 %
Platelets: 313 10*3/uL (ref 150–400)
RBC: 3.99 MIL/uL (ref 3.87–5.11)
RDW: 17.1 % — ABNORMAL HIGH (ref 11.5–15.5)
WBC: 7.6 10*3/uL (ref 4.0–10.5)
nRBC: 0 % (ref 0.0–0.2)

## 2023-07-16 LAB — COMPREHENSIVE METABOLIC PANEL WITH GFR
ALT: 20 U/L (ref 0–44)
AST: 23 U/L (ref 15–41)
Albumin: 3.6 g/dL (ref 3.5–5.0)
Alkaline Phosphatase: 62 U/L (ref 38–126)
Anion gap: 5 (ref 5–15)
BUN: 14 mg/dL (ref 6–20)
CO2: 21 mmol/L — ABNORMAL LOW (ref 22–32)
Calcium: 8.8 mg/dL — ABNORMAL LOW (ref 8.9–10.3)
Chloride: 110 mmol/L (ref 98–111)
Creatinine, Ser: 0.82 mg/dL (ref 0.44–1.00)
GFR, Estimated: >60 mL/min (ref >60)
Glucose, Bld: 104 mg/dL — ABNORMAL HIGH (ref 70–99)
Potassium: 4.1 mmol/L (ref 3.5–5.1)
Sodium: 136 mmol/L (ref 135–145)
Total Bilirubin: 0.4 mg/dL (ref 0.0–1.2)
Total Protein: 7.2 g/dL (ref 6.5–8.1)

## 2023-07-16 LAB — URINALYSIS, ROUTINE W REFLEX MICROSCOPIC
Bilirubin Urine: NEGATIVE
Glucose, UA: NEGATIVE mg/dL
Hgb urine dipstick: NEGATIVE
Ketones, ur: NEGATIVE mg/dL
Leukocytes,Ua: NEGATIVE
Nitrite: NEGATIVE
Protein, ur: NEGATIVE mg/dL
Specific Gravity, Urine: >1.046 — ABNORMAL HIGH (ref 1.005–1.030)
pH: 5 (ref 5.0–8.0)

## 2023-07-16 LAB — LIPASE, BLOOD: Lipase: 29 U/L (ref 11–51)

## 2023-07-16 LAB — I-STAT CG4 LACTIC ACID, ED: Lactic Acid, Venous: 0.5 mmol/L (ref 0.5–1.9)

## 2023-07-16 LAB — HCG, SERUM, QUALITATIVE: Preg, Serum: NEGATIVE

## 2023-07-16 MED ORDER — HYDROCODONE-ACETAMINOPHEN 5-325 MG PO TABS
1.0000 | ORAL_TABLET | ORAL | 0 refills | Status: DC | PRN
  Filled 2023-07-16: qty 10, 2d supply, fill #0
  Filled 2023-07-16: qty 10, 5d supply, fill #0

## 2023-07-16 MED ORDER — MORPHINE SULFATE (PF) 4 MG/ML IV SOLN
4.0000 mg | Freq: Once | INTRAVENOUS | Status: AC
  Administered 2023-07-16: 4 mg via INTRAVENOUS
  Filled 2023-07-16: qty 1

## 2023-07-16 MED ORDER — IOHEXOL 300 MG/ML  SOLN
100.0000 mL | Freq: Once | INTRAMUSCULAR | Status: AC | PRN
  Administered 2023-07-16: 100 mL via INTRAVENOUS

## 2023-07-16 MED ORDER — ONDANSETRON HCL 4 MG/2ML IJ SOLN
4.0000 mg | Freq: Once | INTRAMUSCULAR | Status: AC
  Administered 2023-07-16: 4 mg via INTRAVENOUS
  Filled 2023-07-16: qty 2

## 2023-07-16 MED ORDER — SODIUM CHLORIDE 0.9 % IV BOLUS
1000.0000 mL | Freq: Once | INTRAVENOUS | Status: AC
  Administered 2023-07-16: 1000 mL via INTRAVENOUS

## 2023-07-16 NOTE — Discharge Instructions (Signed)
 Take 8 scoops of miralax in 32oz of whatever you would like to drink.(Gatorade comes in this size) You can also use a fleets enema which you can buy over the counter at the pharmacy.  Return for worsening abdominal pain, vomiting or fever.  Take 4 over the counter ibuprofen tablets 3 times a day or 2 over-the-counter naproxen tablets twice a day for pain. Also take tylenol 1000mg (2 extra strength) four times a day.

## 2023-07-16 NOTE — ED Provider Notes (Signed)
 [place] EMERGENCY DEPARTMENT AT [facility] Provider Note   CSN: [id number] Arrival date & time: 07/15/23  2319     History  Chief Complaint  Patient presents with   Post-op Problem    Sherri [name] is a 38 y.o. female.  38 yo F with a chief complaints of left upper quadrant abdominal pain.  The patient has been having pain since she had a laparoscopic appendectomy done about 6 days ago.  She has been able to eat and drink but does not feel well.  Is moving her bowels.  Has had some nausea but no vomiting.  Chills at times.          Home Medications Prior to Admission medications   Medication Sig Start Date End Date Taking? Authorizing Provider  acetaminophen  (TYLENOL ) 500 MG tablet Take 2 tablets (1,000 mg total) by mouth every 6 (six) hours as needed for mild pain (pain score 1-3), fever or headache. 07/13/23   Annetta Killian, PA-C  amLODipine (NORVASC) 5 MG tablet Take 1 tablet by mouth daily as needed (HBP). 08/11/22   [provider]  azelastine  (ASTELIN ) 0.1 % nasal spray Place 1 spray into both nostrils 2 (two) times daily. Use in each nostril as directed Patient taking differently: Place 1 spray into both nostrils daily as needed for rhinitis or allergies. 05/16/23   Reddick, Johnathan B, NP  benzonatate  (TESSALON ) 200 MG capsule Take 1 capsule (200 mg total) by mouth 3 (three) times daily as needed for cough. 05/16/23   Reddick, Johnathan B, NP  clindamycin  (CLEOCIN  T) 1 % lotion Apply to cleansed face daily in the morning. Patient taking differently: Apply 1 Application topically as needed (breakouts). 06/30/21     cyclobenzaprine  (FLEXERIL ) 10 MG tablet Take 1 tablet (10 mg total) by mouth 3 (three) times daily as needed for muscle spasms. 07/15/23   McDonald, Olive Better, DPM  EPINEPHrine  (EPIPEN  2-PAK) 0.3 mg/0.3 mL IJ SOAJ injection Inject 0.3 mg into the muscle as needed for anaphylaxis. 12/31/22   Brian Campanile, MD  fluticasone   (FLONASE ) 50 MCG/ACT nasal spray Place 1 spray into both nostrils daily as needed for allergies or rhinitis. 04/20/23   [provider]  gabapentin  (NEURONTIN ) 300 MG capsule Take 1 capsule (300 mg total) by mouth 3 (three) times daily for 7 days. Patient taking differently: Take 300 mg by mouth daily as needed (nerve pain). 06/04/23 07/10/23  McDonald, Olive Better, DPM  hydrOXYzine  (ATARAX ) 25 MG tablet 25 mg at bedtime as needed (sleep). 08/11/22   [provider]  ibuprofen  (ADVIL ) 600 MG tablet Take 1 tablet (600 mg total) by mouth 3 (three) times daily. TAKE WITH FOOD 07/13/23   Annetta Killian, PA-C  levocetirizine (XYZAL ) 5 MG tablet TAKE 1 TABLET (5 MG TOTAL) BY MOUTH DAILY AS NEEDED FOR ALLERGIES (CAN TAKE AN EXTRA DOSE DURING FLARE UPS.). 07/15/23   Kozlow, Rema Care, MD  lisinopril (ZESTRIL) 10 MG tablet Take 10 mg by mouth daily as needed. 04/22/23 07/21/23  [provider]  medroxyPROGESTERone  (DEPO-PROVERA ) 150 MG/ML injection Inject 1 mL (150 mg total) into the muscle every 3 (three) months. 06/29/23   Leftwich-Kirby, Darren Em, CNM  montelukast  (SINGULAIR ) 10 MG tablet Take 10 mg by mouth at bedtime as needed (allergies). 01/27/22   [provider]  omeprazole  (PRILOSEC) 40 MG capsule Take 1 capsule (40 mg total) by mouth every morning. Patient taking differently: Take 40 mg by mouth daily as needed (reflux).  02/18/23   Brian Campanile, MD  polyethylene glycol (MIRALAX  / GLYCOLAX ) 17 g packet Take 17 g by mouth daily as needed for mild constipation. 07/13/23   Annetta Killian, PA-C  rizatriptan  (MAXALT ) 10 MG tablet Take 1 tablet (10 mg total) by mouth as needed for migraine. May repeat in 2 hours if needed.  Maximum 2 tablets in 24 hours. 06/11/23   Merriam Abbey, DO  traMADol  (ULTRAM ) 50 MG tablet Take 1-2 tablets (50-100 mg total) by mouth every 6 (six) hours as needed for moderate pain (pain score 4-6) or severe pain (pain score 7-10) (50 mg for moderate, 100  mg for severe). 07/13/23   Annetta Killian, PA-C  valACYclovir  (VALTREX ) 1000 MG tablet Take 1,000 mg by mouth as needed (Outbreaks). 02/03/22   [provider]  Vitamin D , Ergocalciferol , (DRISDOL) 1.25 MG (50000 UNIT) CAPS capsule Take 50,000 Units by mouth every 7 (seven) days. 07/07/22   [provider]      Allergies    Macrobid  [nitrofurantoin ], Augmentin  [amoxicillin -pot clavulanate], Cefdinir , and Metformin  and related    Review of Systems   Review of Systems  Physical Exam Updated Vital Signs BP (!) 148/82   Pulse 88   Temp 98.3 F (36.8 C)   Resp 18   Ht 4\' 11"  (1.499 m)   Wt 87.5 kg   LMP 06/14/2023 (Approximate) Comment: negative HCG 06/17/23  negative beta HCG 07/10/23, negative HCG 07/16/23  SpO2 100%   BMI 38.98 kg/m  Physical Exam Vitals and nursing note reviewed.  Constitutional:      General: She is not in acute distress.    Appearance: She is well-developed. She is not diaphoretic.  HENT:     Head: Normocephalic and atraumatic.  Eyes:     Pupils: Pupils are equal, round, and reactive to light.  Cardiovascular:     Rate and Rhythm: Normal rate and regular rhythm.     Heart sounds: No murmur heard.    No friction rub. No gallop.  Pulmonary:     Effort: Pulmonary effort is normal.     Breath sounds: No wheezing or rales.  Abdominal:     General: There is no distension.     Palpations: Abdomen is soft.     Tenderness: There is no abdominal tenderness.     Comments: Some mild pain over the right lower quadrant.  Patient with more pain on the left lower quadrant.  There is some mild erythema along the left lower quadrant laparoscopic incision.  I do not appreciate any obvious induration or fluctuance.  No appreciable drainage.  Musculoskeletal:        General: No tenderness.     Cervical back: Normal range of motion and neck supple.  Skin:    General: Skin is warm and dry.  Neurological:     Mental Status: She is alert and oriented to  person, place, and time.  Psychiatric:        Behavior: Behavior normal.     ED Results / Procedures / Treatments   Labs (all labs ordered are listed, but only abnormal results are displayed) Labs Reviewed  CBC WITH DIFFERENTIAL/PLATELET - Abnormal; Notable for the following components:      Result Value   Hemoglobin 10.8 (*)    HCT 34.0 (*)    RDW 17.1 (*)    All other components within normal limits  COMPREHENSIVE METABOLIC PANEL WITH GFR - Abnormal; Notable for the following components:   CO2  21 (*)    Glucose, Bld 104 (*)    Calcium  8.8 (*)    All other components within normal limits  LIPASE, BLOOD  HCG, SERUM, QUALITATIVE  URINALYSIS, ROUTINE W REFLEX MICROSCOPIC  I-STAT CG4 LACTIC ACID, ED  I-STAT CG4 LACTIC ACID, ED    EKG None  Radiology CT ABDOMEN PELVIS W CONTRAST Result Date: 07/16/2023 CLINICAL DATA:  Left lower quadrant pain.  Appendectomy 6 days ago. EXAM: CT ABDOMEN AND PELVIS WITH CONTRAST TECHNIQUE: Multidetector CT imaging of the abdomen and pelvis was performed using the standard protocol following bolus administration of intravenous contrast. RADIATION DOSE REDUCTION: This exam was performed according to the departmental dose-optimization program which includes automated exposure control, adjustment of the mA and/or kV according to patient size and/or use of iterative reconstruction technique. CONTRAST:  OMNIPAQUE  IOHEXOL  300 MG/ML  SOLN COMPARISON:  CT with contrast 07/10/2023, CT with contrast 06/18/2023 FINDINGS: Lower chest: There is atelectasis and air trapping in the lung bases, without focal infiltrates. The heart is slightly enlarged. Hepatobiliary: No focal liver abnormality is seen. No gallstones, gallbladder wall thickening, or biliary dilatation. Pancreas: No abnormality. Spleen: No abnormality. Adrenals/Urinary Tract: No abnormality. Stomach/Bowel: Unremarkable stomach. Normal caliber unopacified small bowel. Interval appendectomy. Linear  scar-like opacity is seen in the subjacent fat but no inflammatory changes, air leak or abscess. Moderate fecal stasis noted diffusely. No evidence of colitis or diverticulitis. There is no evidence of omental infarcts or focal inflammatory mesenteric process. Vascular/Lymphatic: No significant vascular findings are present. No enlarged abdominal or pelvic lymph nodes. Reproductive: Uterus and bilateral adnexa are unremarkable. Other: There is stranding from a laparoscopic trocar insertion site in the anterolateral left lower abdomen but no abscess. There is trace subcutaneous air just above the umbilicus, but no associated inflammatory change or fluid collection. There is a small umbilical fat hernia. There is no incarcerated hernia. No free fluid, free hemorrhage or free air. Musculoskeletal: No acute or significant osseous findings. L5-S1 disc protrusion is noted and may compromise both S1 nerve roots. IMPRESSION: 1. Interval appendectomy with no evidence of inflammatory changes, air leak or abscess. 2. Regarding left lower quadrant pain, there is evidence of a trocar insertion in the area but no underlying abscess. 3. Atelectasis and air trapping in the lung bases. 4. Mild cardiomegaly. 5. L5-S1 disc protrusion which may compromise both S1 nerve roots. 6. Small umbilical fat hernia. 7. Constipation. Electronically Signed   By: Denman Fischer M.D.   On: 07/16/2023 01:34    Procedures Procedures    Medications Ordered in ED Medications  morphine  (PF) 4 MG/ML injection 4 mg (4 mg Intravenous Given 07/16/23 0200)  ondansetron  (ZOFRAN ) injection 4 mg (4 mg Intravenous Given 07/16/23 0200)  sodium chloride  0.9 % bolus 1,000 mL (1,000 mLs Intravenous New Bag/Given 07/16/23 0200)  iohexol  (OMNIPAQUE ) 300 MG/ML solution 100 mL (100 mLs Intravenous Contrast Given 07/16/23 0055)    ED Course/ Medical Decision Making/ A&P                                 Medical Decision Making Amount and/or Complexity of  Data Reviewed Labs: ordered. Radiology: ordered.  Risk Prescription drug management.   38 yo F with a chief complaints of abdominal pain status post appendectomy done about 5 or 6 days ago.  She does have a bit of abdominal discomfort on exam.  Interestingly this is in the left lower quadrant instead  of the right.  Will obtain CT imaging.  No leukocytosis, no elevation to the LFTs and lipase.  CT imaging without obvious postsurgical complication.  No obvious signs of abscess or obvious signs of cellulitis on CT.  There are some signs of constipation.  I discussed the results with the patient.  I encouraged her to continue to take over-the-counter medications for her discomfort.  I encouraged her to follow-up with her general surgeon in the office.  2:24 AM:  I have discussed the diagnosis/risks/treatment options with the patient.  Evaluation and diagnostic testing in the emergency department does not suggest an emergent condition requiring admission or immediate intervention beyond what has been performed at this time.  They will follow up with Gen surgery. We also discussed returning to the ED immediately if new or worsening sx occur. We discussed the sx which are most concerning (e.g., sudden worsening pain, fever, inability to tolerate by mouth) that necessitate immediate return. Medications administered to the patient during their visit and any new prescriptions provided to the patient are listed below.  Medications given during this visit Medications  morphine  (PF) 4 MG/ML injection 4 mg (4 mg Intravenous Given 07/16/23 0200)  ondansetron  (ZOFRAN ) injection 4 mg (4 mg Intravenous Given 07/16/23 0200)  sodium chloride  0.9 % bolus 1,000 mL (1,000 mLs Intravenous New Bag/Given 07/16/23 0200)  iohexol  (OMNIPAQUE ) 300 MG/ML solution 100 mL (100 mLs Intravenous Contrast Given 07/16/23 0055)     The patient appears reasonably screen and/or stabilized for discharge and I doubt any other medical  condition or other [facility] requiring further screening, evaluation, or treatment in the ED at this time prior to discharge.          Final Clinical Impression(s) / ED Diagnoses Final diagnoses:  LLQ abdominal pain    Rx / DC Orders ED Discharge Orders     None         Albertus Hughs, DO 07/16/23 0224

## 2023-07-20 ENCOUNTER — Telehealth: Payer: Self-pay | Admitting: Podiatry

## 2023-07-20 ENCOUNTER — Encounter: Payer: Self-pay | Admitting: Physical Therapy

## 2023-07-20 ENCOUNTER — Ambulatory Visit: Admitting: Physical Therapy

## 2023-07-20 DIAGNOSIS — M79671 Pain in right foot: Secondary | ICD-10-CM | POA: Diagnosis not present

## 2023-07-20 DIAGNOSIS — M79661 Pain in right lower leg: Secondary | ICD-10-CM

## 2023-07-20 NOTE — Therapy (Signed)
 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    Urinary tract infection    Uterine fibroid    Past Surgical History:  Procedure Laterality Date   CESAREAN SECTION N/A 06/05/2014   Procedure: CESAREAN SECTION;  Surgeon: Malka Sea, DO;  Location: [facility];  Service: Obstetrics;  Laterality: N/A;   FOOT SURGERY     LAPAROSCOPIC APPENDECTOMY N/A 07/10/2023   Procedure: APPENDECTOMY, LAPAROSCOPIC;  Surgeon: Edmon Gosling, MD;  Location: [facility];  Service: General;  Laterality: N/A;   WISDOM TOOTH EXTRACTION     Patient Active Problem List   Diagnosis Date Noted   Acute appendicitis 07/10/2023   Initiation of Depo Provera  06/29/2023   Numbness and tingling of hand 05/13/2023   Moderate episode of recurrent major depressive disorder (HCC) 07/16/2022   Pre-diabetes 07/23/2021   Anxiety and depression 06/24/2021   Fibromyalgia 06/24/2021   Chronic sinusitis 12/26/2020   Sinus pressure 12/26/2020   Prediabetes 07/05/2020   Gastroesophageal reflux disease 04/26/2020   Perianal rash 03/26/2020   Chronic nausea 03/26/2020   Chronic constipation 03/09/2019   Pelvic pain in female 03/09/2019   Hypertension    Genital herpes    Uterine fibroid 12/25/2013   Obesity 12/25/2013   Bacterial vaginitis 04/27/2012    PCP: Brendan Call, MD  REFERRING PROVIDER: Floyce Hutching, DPM  THERAPY  DIAG:  Pain in right foot  Pain in right lower leg  REFERRING DIAG: Bilateral plantar fasciitis [M72.2], 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 No, Number of falls: 0  MOI/History of condition:  Onset date: 4/4  SUBJECTIVE STATEMENT Pt reports she had an emergency appendectomy last week and has not been able to perform HEP as recommended. She has been walking in her home without the boot. 7/10 heel pain in the boot. No pain when out of the boot.    EVAL: Sherri [name] is a 38 y.o. female who presents to clinic with chief complaint of calf and foot pain after plantar fascia release and gastorc recession on 4/4.  She has had a fair amount of pain since the surgery but this is slowly improving.  She has had come n/t in the lateral aspect of her foot.  She is WBAT in boot but has mostly been using her knee scooter  at home.  She has a several year history of PF pain which interfered with her work and daily activities.    Red flags:  denies   Pain:  Are you having pain? 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  Education administrator: Transport planner Dominance: NA  Patient Goals/Specific Activities: reduce pain   OBJECTIVE:   DIAGNOSTIC FINDINGS:  NA  GENERAL OBSERVATION/GAIT:  CAM boot using knee  scooter  SENSATION: Light touch: Deficits lateral R foot  PALPATION: TTP throughout R LE particularly around surgical sites  LE MMT:  MMT Right (Eval) Left (Eval)  Hip flexion (L2, L3) 4 4+  Knee extension (L3) 3+ 4  Knee flexion    Hip abduction 4 4  Hip extension    Hip external rotation    Hip internal rotation    Hip adduction    Ankle dorsiflexion (L4)    Ankle plantarflexion (S1) nt nt  Ankle inversion    Ankle eversion    Great Toe ext (L5)    Grossly     (Blank rows = not tested, score listed is out of 5 possible points.  N = WNL, D = diminished, C = clear for gross weakness with myotome testing, * = concordant pain with testing)  LE ROM:  ROM Right (Eval) Left (Eval)  Hip flexion    Hip extension    Hip abduction    Hip adduction    Hip internal rotation    Hip external rotation    Knee extension    Knee flexion    Ankle dorsiflexion 6* 11  Ankle plantarflexion N* n  Ankle inversion 30 30  Ankle eversion 20 20   (Blank rows = not tested, N = WNL, * = concordant pain with testing)  Functional Tests  Eval    10 m max gait speed: 23'', .43 m/s, AD: N                                                          PATIENT SURVEYS: LEFS:    TODAY'S TREATMENT:  [facility] Adult PT Treatment:                                                DATE: 07/20/2023 Therapeutic Exercise:  Seated heel raise Toe scrunches Toe yoga Seated calf stretch Ankle 3 way - RTB - 10x    HOME EXERCISE PROGRAM: Access Code: [email or web address] URL: [email or web address] Date: 07/02/2023 Prepared by: Lesleigh Rash  Exercises - Seated Toe Towel Scrunches  - 1 x daily - 7 x weekly - 1 sets - 2 reps - 1 minute hold - Ankle Inversion Eversion Towel Slide  - 1 x daily - 7 x weekly - 3 sets - 10 reps - Seated Ankle Plantar Flexion with Resistance Loop  - 1 x daily - 7 x weekly - 2 sets - 10 reps - Side to Side Weight Shift with Counter Support  - 1 x daily - 7  x weekly - 3 sets - 10 reps - Staggered Stance Forward Backward Weight Shift with Counter Support  - 1 x daily - 7 x weekly - 3 sets - 10 reps 07/20/23 - Seated Ankle Eversion with Resistance  - 1 x daily - 7 x weekly - 2 sets - 10 reps - Seated Ankle Dorsiflexion with Resistance  - 1 x daily - 7 x weekly - 2 sets - 10 reps - Seated Gastroc Stretch with Strap  - 1 x daily - 7 x weekly - 1 sets - 3 reps - 10 hold  Treatment priorities   Eval 5/9       Gait in CAM boot ''       Gentle strengthening ''        Foot intrinsics                         ASSESSMENT:  CLINICAL IMPRESSION: Pt is 6 weeks post op. Pt reports min compliance with HEP due to recent appendectomy. Today she arrives in Cam boot with 7/10 heel pain. She reports walking out of boot at home which is more comfortable and denies pain with this. No longer using sooter. Reviewed HEP which she required mod cues to perform resisted PF correctly. Added in resisted DF and EV for HEP. Next MD appt 5/30. She continues to struggle with intrinsic foot movements. Pt to call MD regarding cam boot as she believes her appt was canceled and the appt on 5/30 is a consult for her other foot.     EVAL:Sherri [name]  is a 38 y.o. female who presents to clinic with signs and sxs consistent with R calf and foot pain following PF and gastroc release on 4/4.  Ankle ROM is well maintained.  PT with high fear avoidance.  Discussed need to gently introduce movement and strengthening to prepare for ambulation.  Encouraged her to start weaning from knee scooter at home and try walking for progressively longer at home in the boot.  Sherri [name]  will benefit from skilled PT to address relevant deficits and improve ability to complete required tasks such as walking, work, and housework.    OBJECTIVE IMPAIRMENTS: Pain, R ankle DF ROM, ankle strength, gait, balance  ACTIVITY LIMITATIONS: standing, walking, working, housework, squatting, bending, lifting  PERSONAL  FACTORS: See medical history and pertinent history   REHAB POTENTIAL: Good  CLINICAL DECISION MAKING: Evolving/moderate complexity  EVALUATION COMPLEXITY: Moderate   GOALS:   SHORT TERM GOALS: Target date: 07/30/2023   Kenzly  will be >75% HEP compliant to improve carryover between sessions and facilitate independent management of condition  Evaluation: ongoing Goal status: INITIAL   LONG TERM GOALS: Target date: 07/30/2023  Sherri [name]  will self report >/= 50% decrease in pain from evaluation to improve function in daily tasks  Evaluation/Baseline: 8/10 max pain Goal status: INITIAL   2.  Sherri [name]  will improve 10 meter max gait speed to 1 m/s (.1 m/s MCID) to show functional improvement in ambulation and allow ambulation to store and in community  Evaluation/Baseline: .43 m/s Goal status: INITIAL   Norms:     3.  Sherri [name]  will be able to stand for >30'' in tandem stance on foam, to show a significant improvement in balance in order to reduce fall risk   Evaluation/Baseline: not tested Goal status: INITIAL   4.  Sherri [name]  will show a >/= 27 pt improvement in LEFS score (MCID is ~11% or 9 pts) as a proxy for functional improvement  Evaluation/Baseline: 28/80 pts Goal status: INITIAL   5.  Sherri [name]  will be able to wean from boot and normalize gait, not limited by pain  Evaluation/Baseline: limited Goal status: INITIAL   PLAN: PT FREQUENCY: 1-2x/week  PT DURATION: 8 weeks  PLANNED INTERVENTIONS:  97164- PT Re-evaluation, 97110-Therapeutic exercises, 97530- Therapeutic activity, 97112- Neuromuscular re-education, 97535- Self Care, 11914- Manual therapy, [id number]- Gait training, [id number]- Aquatic Therapy, [id number]- Electrical stimulation (manual), [id number]- Vasopneumatic device, [id number]- Traction (mechanical), [id number]- Ionotophoresis 4mg /ml Dexamethasone , Taping, Dry Needling, Joint manipulation, and Spinal manipulation.  Gasper Karst, PTA 07/20/23 8:55 AM Phone:  [phone] Fax: [phone]

## 2023-07-20 NOTE — Telephone Encounter (Signed)
 Patient called and would like to know when she could discontinue wearing her boot. Her surgery was on 06/04/2023. Thank you.

## 2023-07-21 ENCOUNTER — Other Ambulatory Visit (HOSPITAL_COMMUNITY): Payer: Self-pay

## 2023-07-21 DIAGNOSIS — N3281 Overactive bladder: Secondary | ICD-10-CM | POA: Insufficient documentation

## 2023-07-22 ENCOUNTER — Encounter: Payer: Self-pay | Admitting: Physical Therapy

## 2023-07-22 ENCOUNTER — Other Ambulatory Visit (HOSPITAL_COMMUNITY): Payer: Self-pay

## 2023-07-22 ENCOUNTER — Emergency Department (HOSPITAL_COMMUNITY)
Admission: EM | Admit: 2023-07-22 | Discharge: 2023-07-23 | Disposition: A | Discharge status: Home / Self Care | Attending: Emergency Medicine | Admitting: Emergency Medicine

## 2023-07-22 ENCOUNTER — Encounter (HOSPITAL_COMMUNITY): Payer: Self-pay

## 2023-07-22 ENCOUNTER — Emergency Department (HOSPITAL_COMMUNITY)

## 2023-07-22 ENCOUNTER — Ambulatory Visit: Admitting: Physical Therapy

## 2023-07-22 ENCOUNTER — Other Ambulatory Visit: Payer: Self-pay

## 2023-07-22 DIAGNOSIS — R2681 Unsteadiness on feet: Secondary | ICD-10-CM

## 2023-07-22 DIAGNOSIS — R0682 Tachypnea, not elsewhere classified: Secondary | ICD-10-CM | POA: Insufficient documentation

## 2023-07-22 DIAGNOSIS — R509 Fever, unspecified: Secondary | ICD-10-CM | POA: Insufficient documentation

## 2023-07-22 DIAGNOSIS — G8918 Other acute postprocedural pain: Secondary | ICD-10-CM | POA: Diagnosis not present

## 2023-07-22 DIAGNOSIS — R799 Abnormal finding of blood chemistry, unspecified: Secondary | ICD-10-CM | POA: Diagnosis not present

## 2023-07-22 DIAGNOSIS — M6281 Muscle weakness (generalized): Secondary | ICD-10-CM

## 2023-07-22 DIAGNOSIS — M79671 Pain in right foot: Secondary | ICD-10-CM

## 2023-07-22 DIAGNOSIS — R109 Unspecified abdominal pain: Secondary | ICD-10-CM | POA: Insufficient documentation

## 2023-07-22 DIAGNOSIS — M79661 Pain in right lower leg: Secondary | ICD-10-CM

## 2023-07-22 LAB — COMPREHENSIVE METABOLIC PANEL WITH GFR
ALT: 17 U/L (ref 0–44)
AST: 20 U/L (ref 15–41)
Albumin: 3.6 g/dL (ref 3.5–5.0)
Alkaline Phosphatase: 62 U/L (ref 38–126)
Anion gap: 10 (ref 5–15)
BUN: 14 mg/dL (ref 6–20)
CO2: 16 mmol/L — ABNORMAL LOW (ref 22–32)
Calcium: 9 mg/dL (ref 8.9–10.3)
Chloride: 111 mmol/L (ref 98–111)
Creatinine, Ser: 0.71 mg/dL (ref 0.44–1.00)
GFR, Estimated: >60 mL/min (ref >60)
Glucose, Bld: 128 mg/dL — ABNORMAL HIGH (ref 70–99)
Potassium: 3.1 mmol/L — ABNORMAL LOW (ref 3.5–5.1)
Sodium: 137 mmol/L (ref 135–145)
Total Bilirubin: 0.3 mg/dL (ref 0.0–1.2)
Total Protein: 6.9 g/dL (ref 6.5–8.1)

## 2023-07-22 LAB — CBC WITH DIFFERENTIAL/PLATELET
Abs Immature Granulocytes: 0.01 10*3/uL (ref 0.00–0.07)
Basophils Absolute: 0.1 10*3/uL (ref 0.0–0.1)
Basophils Relative: 1 %
Eosinophils Absolute: 0.2 10*3/uL (ref 0.0–0.5)
Eosinophils Relative: 3 %
HCT: 32.8 % — ABNORMAL LOW (ref 36.0–46.0)
Hemoglobin: 10.7 g/dL — ABNORMAL LOW (ref 12.0–15.0)
Immature Granulocytes: 0 %
Lymphocytes Relative: 40 %
Lymphs Abs: 2.9 10*3/uL (ref 0.7–4.0)
MCH: 27.8 pg (ref 26.0–34.0)
MCHC: 32.6 g/dL (ref 30.0–36.0)
MCV: 85.2 fL (ref 80.0–100.0)
Monocytes Absolute: 0.6 10*3/uL (ref 0.1–1.0)
Monocytes Relative: 8 %
Neutro Abs: 3.5 10*3/uL (ref 1.7–7.7)
Neutrophils Relative %: 48 %
Platelets: 288 10*3/uL (ref 150–400)
RBC: 3.85 MIL/uL — ABNORMAL LOW (ref 3.87–5.11)
RDW: 17.1 % — ABNORMAL HIGH (ref 11.5–15.5)
WBC: 7.2 10*3/uL (ref 4.0–10.5)
nRBC: 0 % (ref 0.0–0.2)

## 2023-07-22 LAB — LIPASE, BLOOD: Lipase: 45 U/L (ref 11–51)

## 2023-07-22 LAB — HCG, SERUM, QUALITATIVE: Preg, Serum: NEGATIVE

## 2023-07-22 LAB — CBG MONITORING, ED: Glucose-Capillary: 111 mg/dL — ABNORMAL HIGH (ref 70–99)

## 2023-07-22 MED ORDER — HYDROCODONE-ACETAMINOPHEN 5-325 MG PO TABS
1.0000 | ORAL_TABLET | Freq: Four times a day (QID) | ORAL | 0 refills | Status: DC | PRN
  Filled 2023-07-22: qty 8, 2d supply, fill #0

## 2023-07-22 MED ORDER — IOHEXOL 350 MG/ML SOLN
100.0000 mL | Freq: Once | INTRAVENOUS | Status: AC | PRN
  Administered 2023-07-22: 100 mL via INTRAVENOUS

## 2023-07-22 NOTE — ED Provider Notes (Signed)
 Care assumed at 2330.  Patient with status post recent appendectomy here for evaluation of persistent/worsening left lower quadrant pain.  Also has some burning in her chest.  Care assumed pending a CTA PE as well as CT abdomen pelvis.  Imaging is unremarkable.  On exam she has no significant tenderness to palpation, no overlying skin lesions concerning for soft tissue infection or shingles.  Discussed with patient findings of studies, including stable anemia.  Discussed following up with her PCP as well as surgery regarding her ongoing postoperative pain.  In terms of her burning chest discomfort will provide prescription for Protonix  for potential reflux.  In terms of her postsurgical pain discussed continuing her medications, will prescribe Lidoderm  patches for the region.   Kelsey Patricia, MD 07/23/23 Merrianne Abbot

## 2023-07-22 NOTE — Therapy (Signed)
 OUTPATIENT PHYSICAL THERAPY LOWER EXTREMITY TREATMENT  Patient Name: Sherri  SHAKEMIA [name] MRN: [id number] DOB:August 15, 1985, 38 y.o., female Today's Date: 07/22/2023   PT End of Session - 07/22/23 4098     Visit Number 5    Number of Visits 8    Date for PT Re-Evaluation 08/27/23    Authorization Type Approved 8 visits 07/02/23-08/31/23    PT Start Time 0812   pt arrived late   PT Stop Time 0842    PT Time Calculation (min) 30 min              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    Urinary tract infection    Uterine fibroid    Past Surgical History:  Procedure Laterality Date   CESAREAN SECTION N/A 06/05/2014   Procedure: CESAREAN SECTION;  Surgeon: Malka Sea, DO;  Location: [facility];  Service: Obstetrics;  Laterality: N/A;   FOOT SURGERY     LAPAROSCOPIC APPENDECTOMY N/A 07/10/2023   Procedure: APPENDECTOMY, LAPAROSCOPIC;  Surgeon: Edmon Gosling, MD;  Location: [facility];  Service: General;  Laterality: N/A;   WISDOM TOOTH EXTRACTION     Patient Active Problem List   Diagnosis Date Noted   Acute appendicitis 07/10/2023   Initiation of Depo Provera  06/29/2023   Numbness and tingling of hand 05/13/2023   Moderate episode of recurrent major depressive disorder (HCC) 07/16/2022   Pre-diabetes 07/23/2021   Anxiety and depression 06/24/2021   Fibromyalgia 06/24/2021   Chronic sinusitis 12/26/2020   Sinus pressure 12/26/2020   Prediabetes 07/05/2020   Gastroesophageal reflux disease 04/26/2020   Perianal rash 03/26/2020   Chronic nausea 03/26/2020   Chronic constipation 03/09/2019   Pelvic pain in female 03/09/2019   Hypertension    Genital herpes    Uterine fibroid 12/25/2013   Obesity 12/25/2013   Bacterial vaginitis 04/27/2012    PCP: Brendan Call, MD  REFERRING  PROVIDER: Floyce Hutching, DPM  THERAPY DIAG:  Pain in right foot  Pain in right lower leg  Muscle weakness  Unsteadiness on feet  REFERRING DIAG: Bilateral plantar fasciitis [M72.2], 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 No, Number of falls: 0  MOI/History of condition:  Onset date: 4/4  SUBJECTIVE STATEMENT Pt reports that she talked to her surgeon and he would like her to stay in the boot until she has her other foot surgery.  She has overall been feeling a bit better with lower pain.   EVAL: Sherri  [name] is a 38 y.o. female who presents to clinic with chief complaint of calf and foot pain after plantar fascia release and gastorc recession on 4/4.  She has had a fair amount of pain since the surgery but this is slowly improving.  She has had come n/t in the lateral aspect of her foot.  She  is WBAT in boot but has mostly been using her knee scooter at home.  She has a several year history of PF pain which interfered with her work and daily activities.    Red flags:  denies   Pain:  Are you having pain? 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  Education administrator: Transport planner Dominance: NA  Patient Goals/Specific Activities: reduce pain   OBJECTIVE:   DIAGNOSTIC FINDINGS:  NA  GENERAL  OBSERVATION/GAIT:  CAM boot using knee scooter  SENSATION: Light touch: Deficits lateral R foot  PALPATION: TTP throughout R LE particularly around surgical sites  LE MMT:  MMT Right (Eval) Left (Eval)  Hip flexion (L2, L3) 4 4+  Knee extension (L3) 3+ 4  Knee flexion    Hip abduction 4 4  Hip extension    Hip external rotation    Hip internal rotation    Hip adduction    Ankle dorsiflexion (L4)    Ankle plantarflexion (S1) nt nt  Ankle inversion    Ankle eversion    Great Toe ext (L5)    Grossly     (Blank rows = not tested, score listed is out of 5 possible points.  N = WNL, D = diminished, C = clear for gross weakness with myotome testing, * = concordant pain with testing)  LE ROM:  ROM Right (Eval) Left (Eval) R 5/22  Hip flexion     Hip extension     Hip abduction     Hip adduction     Hip internal rotation     Hip external rotation     Knee extension     Knee flexion     Ankle dorsiflexion 6* 11 9  Ankle plantarflexion N* n   Ankle inversion 30 30   Ankle eversion 20 20    (Blank rows = not tested, N = WNL, * = concordant pain with testing)  Functional Tests  Eval    10 m max gait speed: 23'', .43 m/s, AD: N                                                          PATIENT SURVEYS: LEFS:    TODAY'S TREATMENT:  [facility] Adult PT Treatment:                                                DATE: 07/22/2023 Therapeutic Exercise:  Seated heel raise Seated toe raise Toe rock paper scissors Seated heel slide for gentle DF Seated calf stretch Ankle 3 way - RTB - 10x    HOME EXERCISE PROGRAM: Access Code: [email or web address] URL: [email or web address] Date: 07/02/2023 Prepared by: Lesleigh Rash  Exercises - Seated Toe Towel Scrunches  - 1 x daily - 7 x weekly - 1 sets - 2 reps - 1 minute hold - Ankle Inversion Eversion Towel Slide  - 1 x daily - 7 x weekly - 3 sets - 10 reps - Seated Ankle Plantar  Flexion with Resistance Loop   - 1 x daily - 7 x weekly - 2 sets - 10 reps - Side to Side Weight Shift with Counter Support  - 1 x daily - 7 x weekly - 3 sets - 10 reps - Staggered Stance Forward Backward Weight Shift with Counter Support  - 1 x daily - 7 x weekly - 3 sets - 10 reps 07/20/23 - Seated Ankle Eversion with Resistance  - 1 x daily - 7 x weekly - 2 sets - 10 reps - Seated Ankle Dorsiflexion with Resistance  - 1 x daily - 7 x weekly - 2 sets - 10 reps - Seated Gastroc Stretch with Strap  - 1 x daily - 7 x weekly - 1 sets - 3 reps - 10 hold  Treatment priorities   Eval 5/9       Gait in CAM boot ''       Gentle strengthening ''        Foot intrinsics                         ASSESSMENT:  CLINICAL IMPRESSION: Carrie  tolerated session well with no adverse reaction.  She is doing much better compared to start of therapy and is walking in CAM boot with no AD.  Her pain is coming down @ 6/10 today.  She has been somewhat compliant with HEP and I reiterated the importance of staying on top of this today.  Late arrival reduced session time today.  Recent appendectomy reduced exercise intensity.  Would like her to work on foot intrinsic exercises at home; she confirms understanding.     EVAL:Austyn  is a 38 y.o. female who presents to clinic with signs and sxs consistent with R calf and foot pain following PF and gastroc release on 4/4.  Ankle ROM is well maintained.  PT with high fear avoidance.  Discussed need to gently introduce movement and strengthening to prepare for ambulation.  Encouraged her to start weaning from knee scooter at home and try walking for progressively longer at home in the boot.  Wilna  will benefit from skilled PT to address relevant deficits and improve ability to complete required tasks such as walking, work, and housework.    OBJECTIVE IMPAIRMENTS: Pain, R ankle DF ROM, ankle strength, gait, balance  ACTIVITY LIMITATIONS: standing, walking, working, housework, squatting, bending,  lifting  PERSONAL FACTORS: See medical history and pertinent history   REHAB POTENTIAL: Good  CLINICAL DECISION MAKING: Evolving/moderate complexity  EVALUATION COMPLEXITY: Moderate   GOALS:   SHORT TERM GOALS: Target date: 07/30/2023   Mykenzie  will be >75% HEP compliant to improve carryover between sessions and facilitate independent management of condition  Evaluation: ongoing Goal status: ongoing   LONG TERM GOALS: Target date: 08/27/2023  Azya  will self report >/= 50% decrease in pain from evaluation to improve function in daily tasks  Evaluation/Baseline: 8/10 max pain Goal status: INITIAL   2.  Khalea  will improve 10 meter max gait speed to 1 m/s (.1 m/s MCID) to show functional improvement in ambulation and allow ambulation to store and in community  Evaluation/Baseline: .43 m/s Goal status: INITIAL   Norms:     3.  Mirai  will be able to stand for >30'' in tandem stance on foam, to show a significant improvement in balance in order to reduce fall risk   Evaluation/Baseline: not tested Goal status: INITIAL   4.  Armya  will show a >/= 27 pt  improvement in LEFS score (MCID is ~11% or 9 pts) as a proxy for functional improvement   Evaluation/Baseline: 28/80 pts Goal status: INITIAL   5.  Mattea  will be able to wean from boot and normalize gait, not limited by pain  Evaluation/Baseline: limited Goal status: INITIAL   PLAN: PT FREQUENCY: 1-2x/week  PT DURATION: 8 weeks  PLANNED INTERVENTIONS:  [id number]- PT Re-evaluation, 97110-Therapeutic exercises, 97530- Therapeutic activity, 97112- Neuromuscular re-education, 97535- Self Care, 16109- Manual therapy, [id number]- Gait training, [id number]- Aquatic Therapy, [id number]- Electrical stimulation (manual), [id number]- Vasopneumatic device, [id number]- Traction (mechanical), [id number]- Ionotophoresis 4mg /ml Dexamethasone , Taping, Dry Needling, Joint manipulation, and Spinal manipulation.  Donovan Gallant Maisa Bedingfield PT 07/22/23 8:51  AM Phone: [phone] Fax: [phone]

## 2023-07-22 NOTE — ED Triage Notes (Signed)
 Pt is c/o left sided abdominal pain where incision site is. Site is clean and looks good, without drainage, heat, redness. States the pain is throbbing. Pt had her appendix out 2 weeks ago.

## 2023-07-22 NOTE — ED Provider Notes (Signed)
 [place] EMERGENCY DEPARTMENT AT [facility] Provider Note   CSN: [id number] Arrival date & time: 07/22/23  2213     History {Add pertinent medical, surgical, social history, OB history to HPI:1} No chief complaint on file.   Sherri [name] is a 38 y.o. female.  This is a 38 year old female is here today with abdominal pain.  Patient reports since having an appendectomy on 5/10 she has been having continued pain.  Says that it worsened suddenly today.  Patient is felt as though she has had a fever.  She has not had any vomiting or diarrhea.  Patient tachypneic for EMS.        Home Medications Prior to Admission medications   Medication Sig Start Date End Date Taking? Authorizing Provider  acetaminophen  (TYLENOL ) 500 MG tablet Take 2 tablets (1,000 mg total) by mouth every 6 (six) hours as needed for mild pain (pain score 1-3), fever or headache. 07/13/23   Annetta Killian, PA-C  amLODipine (NORVASC) 5 MG tablet Take 1 tablet by mouth daily as needed (HBP). 08/11/22   [provider]  azelastine  (ASTELIN ) 0.1 % nasal spray Place 1 spray into both nostrils 2 (two) times daily. Use in each nostril as directed Patient taking differently: Place 1 spray into both nostrils daily as needed for rhinitis or allergies. 05/16/23   Reddick, Johnathan B, NP  benzonatate  (TESSALON ) 200 MG capsule Take 1 capsule (200 mg total) by mouth 3 (three) times daily as needed for cough. 05/16/23   Reddick, Johnathan B, NP  clindamycin  (CLEOCIN  T) 1 % lotion Apply to cleansed face daily in the morning. Patient taking differently: Apply 1 Application topically as needed (breakouts). 06/30/21     cyclobenzaprine  (FLEXERIL ) 10 MG tablet Take 1 tablet (10 mg total) by mouth 3 (three) times daily as needed for muscle spasms. 07/15/23   McDonald, Olive Better, DPM  EPINEPHrine  (EPIPEN  2-PAK) 0.3 mg/0.3 mL IJ SOAJ injection Inject 0.3 mg into the muscle as needed for anaphylaxis. 12/31/22   Brian Campanile, MD  fluticasone  (FLONASE ) 50 MCG/ACT nasal spray Place 1 spray into both nostrils daily as needed for allergies or rhinitis. 04/20/23   [provider]  gabapentin  (NEURONTIN ) 300 MG capsule Take 1 capsule (300 mg total) by mouth 3 (three) times daily for 7 days. Patient taking differently: Take 300 mg by mouth daily as needed (nerve pain). 06/04/23 07/10/23  Floyce Hutching, DPM  HYDROcodone -acetaminophen  (NORCO/VICODIN) 5-325 MG tablet Take 1 tablet by mouth every 6 (six) hours as needed for pain 07/22/23     hydrOXYzine  (ATARAX ) 25 MG tablet 25 mg at bedtime as needed (sleep). 08/11/22   [provider]  ibuprofen  (ADVIL ) 600 MG tablet Take 1 tablet (600 mg total) by mouth 3 (three) times daily. TAKE WITH FOOD 07/13/23   Annetta Killian, PA-C  levocetirizine (XYZAL ) 5 MG tablet TAKE 1 TABLET (5 MG TOTAL) BY MOUTH DAILY AS NEEDED FOR ALLERGIES (CAN TAKE AN EXTRA DOSE DURING FLARE UPS.). 07/15/23   Kozlow, Rema Care, MD  lisinopril (ZESTRIL) 10 MG tablet Take 10 mg by mouth daily as needed. 04/22/23 07/21/23  [provider]  medroxyPROGESTERone  (DEPO-PROVERA ) 150 MG/ML injection Inject 1 mL (150 mg total) into the muscle every 3 (three) months. 06/29/23   Leftwich-Kirby, Darren Em, CNM  montelukast  (SINGULAIR ) 10 MG tablet Take 10 mg by mouth at bedtime as needed (allergies). 01/27/22   [provider]  omeprazole  (PRILOSEC) 40 MG capsule Take 1  capsule (40 mg total) by mouth every morning. Patient taking differently: Take 40 mg by mouth daily as needed (reflux). 02/18/23   Brian Campanile, MD  polyethylene glycol (MIRALAX  / GLYCOLAX ) 17 g packet Take 17 g by mouth daily as needed for mild constipation. 07/13/23   Annetta Killian, PA-C  rizatriptan  (MAXALT ) 10 MG tablet Take 1 tablet (10 mg total) by mouth as needed for migraine. May repeat in 2 hours if needed.  Maximum 2 tablets in 24 hours. 06/11/23   Merriam Abbey, DO  traMADol  (ULTRAM ) 50 MG  tablet Take 1-2 tablets (50-100 mg total) by mouth every 6 (six) hours as needed for moderate pain (pain score 4-6) or severe pain (pain score 7-10) (50 mg for moderate, 100 mg for severe). 07/13/23   Annetta Killian, PA-C  valACYclovir  (VALTREX ) 1000 MG tablet Take 1,000 mg by mouth as needed (Outbreaks). 02/03/22   [provider]  Vitamin D , Ergocalciferol , (DRISDOL) 1.25 MG (50000 UNIT) CAPS capsule Take 50,000 Units by mouth every 7 (seven) days. 07/07/22   [provider]      Allergies    Macrobid  [nitrofurantoin ], Augmentin  [amoxicillin -pot clavulanate], Cefdinir , and Metformin  and related    Review of Systems   Review of Systems  Physical Exam Updated Vital Signs BP (!) 147/97 (BP Location: Left Arm)   Pulse 73   Temp (!) 97.5 F (36.4 C) (Oral)   Resp (!) 22   LMP 06/14/2023 (Approximate) Comment: negative HCG 06/17/23  negative beta HCG 07/10/23, negative HCG 07/16/23  SpO2 100%  Physical Exam Vitals and nursing note reviewed.  Pulmonary:     Breath sounds: Normal breath sounds.     Comments: Tachypnea Abdominal:     General: Abdomen is flat. There is no distension.     Palpations: Abdomen is soft.     Tenderness: There is abdominal tenderness. There is guarding.  Musculoskeletal:     Cervical back: Normal range of motion.  Skin:    General: Skin is warm.  Neurological:     General: No focal deficit present.     Mental Status: She is alert.     ED Results / Procedures / Treatments   Labs (all labs ordered are listed, but only abnormal results are displayed) Labs Reviewed  CBG MONITORING, ED - Abnormal; Notable for the following components:      Result Value   Glucose-Capillary 111 (*)    All other components within normal limits  COMPREHENSIVE METABOLIC PANEL WITH GFR  CBC WITH DIFFERENTIAL/PLATELET  LIPASE, BLOOD  HCG, SERUM, QUALITATIVE    EKG None  Radiology No results found.  Procedures Procedures  {Document cardiac monitor,  telemetry assessment procedure when appropriate:1}  Medications Ordered in ED Medications - No data to display  ED Course/ Medical Decision Making/ A&P   {   Click here for ABCD2, HEART and other calculatorsREFRESH Note before signing :1}                              Medical Decision Making 38 year old female here today for abdominal pain, tachypnea 12 days after surgery.  Plan-on exam, abdomen overall soft however patient is quite tender.  Pain is generalized, does not localize to any particular region.  No peritoneal signs.  Anemia spit the patient up, they reported that she felt warm to the touch, and had a low end-tidal so they activated her as a possible sepsis patient per  the protocol.  On my assessment of the patient, she is afebrile, normotensive, nontachycardic.  Will obtain imaging of the patient's chest abdomen pelvis.  Will check basic labs on the patient.  Reviewed the patient's hospital admission and discharge notes.  Reassessment 11:30 PM-patient be signed out to Dr. Monique Ano pending CT imaging.  Amount and/or Complexity of Data Reviewed Labs: ordered. Radiology: ordered.     {Document critical care time when appropriate:1} {Document review of labs and clinical decision tools ie heart score, Chads2Vasc2 etc:1}  {Document your independent review of radiology images, and any outside records:1} {Document your discussion with family members, caretakers, and with consultants:1} {Document social determinants of health affecting pt's care:1} {Document your decision making why or why not admission, treatments were needed:1} Final Clinical Impression(s) / ED Diagnoses Final diagnoses:  None    Rx / DC Orders ED Discharge Orders     None

## 2023-07-23 ENCOUNTER — Other Ambulatory Visit (HOSPITAL_COMMUNITY): Payer: Self-pay

## 2023-07-23 MED ORDER — LIDOCAINE 5 % EX PTCH
1.0000 | MEDICATED_PATCH | CUTANEOUS | 0 refills | Status: AC
Start: 2023-07-23 — End: ?
  Filled 2023-07-23 (×3): qty 14, 14d supply, fill #0

## 2023-07-23 MED ORDER — KETOROLAC TROMETHAMINE 30 MG/ML IJ SOLN
15.0000 mg | Freq: Once | INTRAMUSCULAR | Status: AC
  Administered 2023-07-23: 15 mg via INTRAVENOUS
  Filled 2023-07-23: qty 1

## 2023-07-23 MED ORDER — GABAPENTIN 300 MG PO CAPS
300.0000 mg | ORAL_CAPSULE | Freq: Three times a day (TID) | ORAL | 0 refills | Status: DC
  Filled 2023-07-23: qty 42, 14d supply, fill #0

## 2023-07-23 MED ORDER — METHOCARBAMOL 500 MG PO TABS
500.0000 mg | ORAL_TABLET | Freq: Three times a day (TID) | ORAL | 0 refills | Status: AC
  Filled 2023-07-23: qty 42, 14d supply, fill #0

## 2023-07-23 MED ORDER — PANTOPRAZOLE SODIUM 40 MG PO TBEC
40.0000 mg | DELAYED_RELEASE_TABLET | Freq: Every day | ORAL | 0 refills | Status: DC
  Filled 2023-07-23 (×2): qty 30, 30d supply, fill #0

## 2023-07-27 ENCOUNTER — Ambulatory Visit

## 2023-07-27 NOTE — Therapy (Incomplete)
 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    Urinary tract infection    Uterine fibroid    Past Surgical History:  Procedure Laterality Date   APPENDECTOMY     CESAREAN SECTION N/A 06/05/2014   Procedure: CESAREAN SECTION;  Surgeon: Malka Sea, DO;  Location: [facility];  Service: Obstetrics;  Laterality: N/A;   FOOT SURGERY     LAPAROSCOPIC APPENDECTOMY N/A 07/10/2023   Procedure: APPENDECTOMY, LAPAROSCOPIC;  Surgeon: Edmon Gosling, MD;  Location: [facility];  Service: General;  Laterality: N/A;   WISDOM TOOTH EXTRACTION     Patient Active Problem List   Diagnosis Date Noted   Acute appendicitis 07/10/2023   Initiation of Depo Provera  06/29/2023   Numbness and tingling of hand 05/13/2023   Moderate episode of recurrent major depressive disorder (HCC) 07/16/2022   Pre-diabetes 07/23/2021   Anxiety and depression 06/24/2021   Fibromyalgia 06/24/2021   Chronic sinusitis 12/26/2020   Sinus pressure 12/26/2020   Prediabetes 07/05/2020   Gastroesophageal reflux disease 04/26/2020   Perianal rash 03/26/2020   Chronic nausea 03/26/2020   Chronic constipation 03/09/2019   Pelvic pain in female 03/09/2019   Hypertension    Genital herpes    Uterine fibroid 12/25/2013   Obesity 12/25/2013   Bacterial vaginitis 04/27/2012    PCP: Brendan Call, MD  REFERRING PROVIDER: Floyce Hutching, DPM  THERAPY DIAG:  No diagnosis found.  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 No, Number of falls: 0  MOI/History of condition:  Onset date: 4/4  SUBJECTIVE STATEMENT Pt reports that she talked to her surgeon and he would like her to stay in the boot until she has her other foot surgery.  She has overall been feeling a bit better with lower pain.   EVAL: Sherri  [name] is a 38 y.o. female who presents to clinic with chief complaint of calf and foot pain after plantar fascia release and gastorc recession on 4/4.  She has had a fair amount of pain since the surgery but this is slowly improving.  She has had come n/t in the lateral aspect of her foot.  She is WBAT in boot but has mostly been using her knee scooter at home.  She has a several year history of PF pain which interfered with her work and daily activities.    Red flags:  denies   Pain:  Are you having pain? 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  Programme researcher, broadcasting/film/video Dominance: NA  Patient Goals/Specific Activities: reduce pain   OBJECTIVE:   DIAGNOSTIC FINDINGS:  NA  GENERAL OBSERVATION/GAIT:  CAM boot using knee scooter  SENSATION: Light touch: Deficits lateral R foot  PALPATION: TTP throughout R LE particularly around surgical sites  LE MMT:  MMT Right (Eval) Left (Eval)  Hip flexion (L2, L3) 4 4+  Knee extension (L3) 3+ 4  Knee flexion    Hip abduction 4 4  Hip extension    Hip external rotation     Hip internal rotation    Hip adduction    Ankle dorsiflexion (L4)    Ankle plantarflexion (S1) nt nt  Ankle inversion    Ankle eversion    Great Toe ext (L5)    Grossly     (Blank rows = not tested, score listed is out of 5 possible points.  N = WNL, D = diminished, C = clear for gross weakness with myotome testing, * = concordant pain with testing)  LE ROM:  ROM Right (Eval) Left (Eval) R 5/22  Hip flexion     Hip extension     Hip abduction     Hip adduction     Hip internal rotation     Hip external rotation     Knee extension     Knee flexion     Ankle dorsiflexion 6* 11 9  Ankle plantarflexion N* n   Ankle inversion 30 30   Ankle eversion 20 20    (Blank rows = not tested, N = WNL, * = concordant pain with testing)  Functional Tests  Eval    10 m max gait speed: 23'', .43 m/s, AD: N                                                          PATIENT SURVEYS: LEFS:    TODAY'S TREATMENT: [facility] Adult PT Treatment:                                                DATE: 07/27/2023 Therapeutic Exercise:  Seated heel raise Seated toe raise Toe rock paper scissors Seated heel slide for gentle DF Seated calf stretch Ankle 3 way - RTB - 10x   [facility] Adult PT Treatment:                                                DATE: 07/22/2023 Therapeutic Exercise:  Seated heel raise Seated toe raise Toe rock paper scissors Seated heel slide for gentle DF Seated calf stretch Ankle 3 way - RTB - 10x   HOME EXERCISE PROGRAM: Access Code: [email or web address] URL: [email or web address] Date: 07/02/2023 Prepared by: Lesleigh Rash  Exercises - Seated Toe Towel Scrunches  - 1 x daily - 7 x weekly - 1 sets - 2 reps - 1 minute hold - Ankle Inversion Eversion Towel Slide  - 1 x daily - 7 x weekly - 3 sets - 10 reps - Seated Ankle Plantar Flexion with  Resistance Loop  - 1 x daily - 7 x weekly - 2 sets - 10 reps - Side to Side Weight Shift with Counter Support   - 1 x daily - 7 x weekly - 3 sets - 10 reps - Staggered Stance Forward Backward Weight Shift with Counter Support  - 1 x daily - 7 x weekly - 3 sets - 10 reps 07/20/23 - Seated Ankle Eversion with Resistance  - 1 x daily - 7 x weekly - 2 sets - 10 reps - Seated Ankle Dorsiflexion with Resistance  - 1 x daily - 7 x weekly - 2 sets - 10 reps - Seated Gastroc Stretch with Strap  - 1 x daily - 7 x weekly - 1 sets - 3 reps - 10 hold  Treatment priorities   Eval 5/9       Gait in CAM boot ''       Gentle strengthening ''        Foot intrinsics                         ASSESSMENT:  CLINICAL IMPRESSION: Sherri [name]  tolerated session well with no adverse reaction.  She is doing much better compared to start of therapy and is walking in CAM boot with no AD.  Her pain is coming down @ 6/10 today.  She has been somewhat compliant with HEP and I reiterated the importance of staying on top of this today.  Late arrival reduced session time today.  Recent appendectomy reduced exercise intensity.  Would like her to work on foot intrinsic exercises at home; she confirms understanding.     EVAL:Sherri [name]  is a 38 y.o. female who presents to clinic with signs and sxs consistent with R calf and foot pain following PF and gastroc release on 4/4.  Ankle ROM is well maintained.  PT with high fear avoidance.  Discussed need to gently introduce movement and strengthening to prepare for ambulation.  Encouraged her to start weaning from knee scooter at home and try walking for progressively longer at home in the boot.  Sherri [name]  will benefit from skilled PT to address relevant deficits and improve ability to complete required tasks such as walking, work, and housework.    OBJECTIVE IMPAIRMENTS: Pain, R ankle DF ROM, ankle strength, gait, balance  ACTIVITY LIMITATIONS: standing, walking, working, housework, squatting, bending, lifting  PERSONAL FACTORS: See medical history and pertinent history   REHAB POTENTIAL:  Good  CLINICAL DECISION MAKING: Evolving/moderate complexity  EVALUATION COMPLEXITY: Moderate   GOALS:   SHORT TERM GOALS: Target date: 07/30/2023   Sherri [name]  will be >75% HEP compliant to improve carryover between sessions and facilitate independent management of condition  Evaluation: ongoing Goal status: ongoing   LONG TERM GOALS: Target date: 08/27/2023  Sherri [name]  will self report >/= 50% decrease in pain from evaluation to improve function in daily tasks  Evaluation/Baseline: 8/10 max pain Goal status: INITIAL   2.  Sherri [name]  will improve 10 meter max gait speed to 1 m/s (.1 m/s MCID) to show functional improvement in ambulation and allow ambulation to store and in community  Evaluation/Baseline: .43 m/s Goal status: INITIAL   Norms:     3.  Sherri [name]  will be able to stand for >30'' in tandem stance on foam, to show a significant improvement in balance in order to reduce fall risk   Evaluation/Baseline: not tested Goal status: INITIAL   4.  Sherri [name]  will show a >/= 27 pt improvement in  LEFS score (MCID is ~11% or 9 pts) as a proxy for functional improvement   Evaluation/Baseline: 28/80 pts Goal status: INITIAL   5.  Sherri [name]  will be able to wean from boot and normalize gait, not limited by pain  Evaluation/Baseline: limited Goal status: INITIAL   PLAN: PT FREQUENCY: 1-2x/week  PT DURATION: 8 weeks  PLANNED INTERVENTIONS:  97164- PT Re-evaluation, 97110-Therapeutic exercises, 97530- Therapeutic activity, 97112- Neuromuscular re-education, 97535- Self Care, 16109- Manual therapy, [id number]- Gait training, [id number]- Aquatic Therapy, [id number]- Electrical stimulation (manual), [id number]- Vasopneumatic device, [id number]- Traction (mechanical), [id number]- Ionotophoresis 4mg /ml Dexamethasone , Taping, Dry Needling, Joint manipulation, and Spinal manipulation.  Sherri [name] PT 07/27/23 5:51 AM Phone: [phone] Fax: [phone]

## 2023-07-28 ENCOUNTER — Ambulatory Visit (INDEPENDENT_AMBULATORY_CARE_PROVIDER_SITE_OTHER): Payer: Self-pay

## 2023-07-28 DIAGNOSIS — J309 Allergic rhinitis, unspecified: Secondary | ICD-10-CM

## 2023-07-29 ENCOUNTER — Other Ambulatory Visit (HOSPITAL_COMMUNITY): Payer: Self-pay

## 2023-07-29 ENCOUNTER — Other Ambulatory Visit: Payer: Self-pay | Admitting: Podiatry

## 2023-07-29 ENCOUNTER — Telehealth: Payer: Self-pay | Admitting: Podiatry

## 2023-07-29 MED ORDER — OXYCODONE HCL 5 MG PO TABS
5.0000 mg | ORAL_TABLET | ORAL | 0 refills | Status: DC | PRN
  Filled 2023-07-29 – 2023-07-30 (×2): qty 20, 4d supply, fill #0

## 2023-07-29 MED ORDER — ONDANSETRON HCL 8 MG PO TABS
8.0000 mg | ORAL_TABLET | Freq: Three times a day (TID) | ORAL | 0 refills | Status: DC | PRN
  Filled 2023-07-29 – 2023-07-30 (×2): qty 20, 7d supply, fill #0

## 2023-07-29 MED ORDER — IBUPROFEN 600 MG PO TABS
600.0000 mg | ORAL_TABLET | Freq: Four times a day (QID) | ORAL | 0 refills | Status: DC | PRN
  Filled 2023-07-29 – 2023-07-30 (×2): qty 56, 14d supply, fill #0

## 2023-07-29 MED ORDER — GABAPENTIN 300 MG PO CAPS
300.0000 mg | ORAL_CAPSULE | Freq: Three times a day (TID) | ORAL | 0 refills | Status: DC
  Filled 2023-07-29 – 2023-08-03 (×4): qty 21, 7d supply, fill #0

## 2023-07-29 MED ORDER — ACETAMINOPHEN 500 MG PO TABS
1000.0000 mg | ORAL_TABLET | Freq: Four times a day (QID) | ORAL | 0 refills | Status: DC | PRN
  Filled 2023-07-29 – 2023-07-30 (×3): qty 112, 14d supply, fill #0

## 2023-07-29 NOTE — Telephone Encounter (Signed)
 Per Dr Michalene Agee I called pt to check on her and she was sick and had to have her appendix removed but is good and wants to proceed with surgery on 07/30/23.

## 2023-07-30 ENCOUNTER — Ambulatory Visit: Admitting: Obstetrics

## 2023-07-30 ENCOUNTER — Other Ambulatory Visit (HOSPITAL_COMMUNITY): Payer: Self-pay

## 2023-07-30 DIAGNOSIS — M722 Plantar fascial fibromatosis: Secondary | ICD-10-CM

## 2023-07-30 DIAGNOSIS — M216X2 Other acquired deformities of left foot: Secondary | ICD-10-CM

## 2023-08-02 ENCOUNTER — Other Ambulatory Visit: Payer: Self-pay

## 2023-08-02 ENCOUNTER — Encounter: Payer: Self-pay | Admitting: Pharmacist

## 2023-08-02 ENCOUNTER — Other Ambulatory Visit (HOSPITAL_COMMUNITY): Payer: Self-pay

## 2023-08-03 ENCOUNTER — Other Ambulatory Visit: Payer: Self-pay

## 2023-08-03 ENCOUNTER — Other Ambulatory Visit (HOSPITAL_COMMUNITY): Payer: Self-pay

## 2023-08-03 MED ORDER — LIDOCAINE 5 % EX PTCH
MEDICATED_PATCH | CUTANEOUS | 0 refills | Status: DC
  Filled 2023-08-03 (×2): qty 14, 14d supply, fill #0

## 2023-08-03 MED ORDER — GABAPENTIN 100 MG PO CAPS
100.0000 mg | ORAL_CAPSULE | Freq: Three times a day (TID) | ORAL | 0 refills | Status: DC
  Filled 2023-08-03 – 2023-08-04 (×2): qty 42, 14d supply, fill #0

## 2023-08-04 ENCOUNTER — Other Ambulatory Visit (HOSPITAL_COMMUNITY): Payer: Self-pay

## 2023-08-04 ENCOUNTER — Other Ambulatory Visit: Payer: Self-pay

## 2023-08-05 ENCOUNTER — Other Ambulatory Visit (HOSPITAL_COMMUNITY): Payer: Self-pay

## 2023-08-05 ENCOUNTER — Encounter: Payer: Self-pay | Admitting: Podiatry

## 2023-08-05 ENCOUNTER — Ambulatory Visit (INDEPENDENT_AMBULATORY_CARE_PROVIDER_SITE_OTHER): Payer: Medicaid Other | Admitting: Podiatry

## 2023-08-05 DIAGNOSIS — M62461 Contracture of muscle, right lower leg: Secondary | ICD-10-CM

## 2023-08-05 DIAGNOSIS — M62462 Contracture of muscle, left lower leg: Secondary | ICD-10-CM

## 2023-08-05 DIAGNOSIS — M722 Plantar fascial fibromatosis: Secondary | ICD-10-CM

## 2023-08-05 MED ORDER — OXYCODONE HCL 5 MG PO TABS
5.0000 mg | ORAL_TABLET | ORAL | 0 refills | Status: AC | PRN
  Filled 2023-08-05: qty 20, 4d supply, fill #0

## 2023-08-05 NOTE — Patient Instructions (Addendum)
 Call to schedule physical therapy: [facility] at [facility]  [phone]  Schedule therapy to start the week after your next visit on June 23 or later

## 2023-08-05 NOTE — Addendum Note (Signed)
 Addended byMichalene Agee, Adalaya Irion R on: 08/05/2023 01:04 PM   Modules accepted: Orders

## 2023-08-05 NOTE — Progress Notes (Addendum)
  Subjective:  Patient ID: Sherri  [name], female    DOB: 01/30/1986,  MRN: [id number]  Chief Complaint  Patient presents with   Routine Post Op    POV #1, DOS 07/30/23, LEFT FOOT PLANTAR FASCIA RELEASE AND CALF MUSCLE LENGTHENING     38 y.o. female returns for post-op check.  Right foot is doing well not painful but numb some at night notes soreness in the back of the left calf  Review of Systems: Negative except as noted in the HPI. Denies N/V/F/Ch.   Objective:  There were no vitals filed for this visit. There is no height or weight on file to calculate BMI. Constitutional Well developed. Well nourished.  Vascular Foot warm and well perfused. Capillary refill normal to all digits.  Calf is soft and supple, no posterior calf or knee pain, negative Homans' sign  Neurologic Normal speech. Oriented to person, place, and time. Epicritic sensation to light touch grossly present bilaterally.  Dermatologic Skin healing well without signs of infection. Skin edges well coapted without signs of infection.  Orthopedic: Tenderness to palpation noted about the surgical site.   Assessment:   1. Bilateral plantar fasciitis   2. Gastrocnemius equinus of right lower extremity   3. Gastrocnemius equinus of left lower extremity    Plan:  Patient was evaluated and treated and all questions answered.  S/p foot surgery left -Progressing as expected post-operatively.  Weight-bear as tolerated in cam boot when ready, she is using the knee scooter now.  Return in 2 weeks to remove sutures.  Referral placed for physical therapy would like her to start this after the next visit for conditioning and strengthening and range of motion of both sides.  Refill oxycodone  sent to pharmacy   No follow-ups on file.

## 2023-08-12 ENCOUNTER — Other Ambulatory Visit (HOSPITAL_COMMUNITY): Payer: Self-pay

## 2023-08-12 ENCOUNTER — Encounter: Payer: Self-pay | Admitting: Allergy

## 2023-08-12 ENCOUNTER — Ambulatory Visit: Payer: Medicaid Other | Admitting: Allergy

## 2023-08-12 VITALS — BP 122/76 | HR 99 | Temp 98.0°F | Resp 16

## 2023-08-12 DIAGNOSIS — J3089 Other allergic rhinitis: Secondary | ICD-10-CM | POA: Diagnosis not present

## 2023-08-12 DIAGNOSIS — J302 Other seasonal allergic rhinitis: Secondary | ICD-10-CM

## 2023-08-12 DIAGNOSIS — K219 Gastro-esophageal reflux disease without esophagitis: Secondary | ICD-10-CM | POA: Diagnosis not present

## 2023-08-12 MED ORDER — PANTOPRAZOLE SODIUM 40 MG PO TBEC
40.0000 mg | DELAYED_RELEASE_TABLET | Freq: Every day | ORAL | 1 refills | Status: DC
  Filled 2023-08-12 – 2023-08-24 (×2): qty 90, 90d supply, fill #0
  Filled 2023-11-22: qty 90, 90d supply, fill #1

## 2023-08-12 MED ORDER — AZELASTINE-FLUTICASONE 137-50 MCG/ACT NA SUSP
1.0000 | Freq: Two times a day (BID) | NASAL | 3 refills | Status: DC | PRN
  Filled 2023-08-12: qty 23, 30d supply, fill #0
  Filled 2023-12-03: qty 23, 30d supply, fill #1

## 2023-08-12 MED ORDER — LEVOCETIRIZINE DIHYDROCHLORIDE 5 MG PO TABS
5.0000 mg | ORAL_TABLET | Freq: Every evening | ORAL | 3 refills | Status: DC
  Filled 2023-08-12 – 2023-10-08 (×4): qty 90, 90d supply, fill #0
  Filled 2023-12-03 – 2023-12-28 (×3): qty 90, 90d supply, fill #1

## 2023-08-12 NOTE — Patient Instructions (Addendum)
  1. Allergen avoidance measures - grass pollen, dust mite, mold  2. Treat and prevent inflammation of airway:   A. Dymista  1 spray each nostril twice a day as needed for nasal congestion or drainage control.   With using nasal sprays point tip of bottle toward eye on same side nostril and lean head slightly forward for best technique.    B. Xyzal  5 mg daily as needed   3. Treat and prevent reflux:   A. Minimize any caffeine or chocolate consumption  B. Protonix -1 tablet daily as needed for reflux   4. Continue on allergy  shots at maintenance dosing!  Bring your epipen  on your allergy  shot days.    5. Return to clinic in 6 months or sooner if needed

## 2023-08-12 NOTE — Progress Notes (Signed)
 Follow-up Note  RE: Sherri [name] MRN: [id number] DOB: 07-27-1985 Date of Office Visit: 08/12/2023   History of present illness: Sherri  [name] is a 38 y.o. female presenting today for follow-up of allergic rhinitis and LPRD.  She was last seen in the office on 02/18/2023 by myself. She had her appendix removed 4 weeks ago that she says was an emergent surgery.  Last week she had lft foot plantar fascitis surgery she is currently in a boot.   Since her last visit in regards to her allergies she states she has been doing okay.  She is on allergy  shots at maintenance dosing and tolerates this well.  She does have access to her epinephrine  device.  She does find the Dymista  nasal spray to be quite helpful and she uses it as needed at this time.  She did not feel the Allegra  worked well that she went back to Xyzal  and she feels like it is doing okay.  She does continue to take Protonix  for reflux control and this works well.  Review of systems: 10pt ROS negative unless noted above in HPI  Past medical/social/surgical/family history have been reviewed and are unchanged unless specifically indicated below.  No changes  Medication List: Current Outpatient Medications  Medication Sig Dispense Refill   acetaminophen  (TYLENOL ) 500 MG tablet Take 2 tablets (1,000 mg total) by mouth every 6 (six) hours as needed for up to 14 days (pain). 112 tablet 0   amLODipine (NORVASC) 5 MG tablet Take 1 tablet by mouth daily as needed (HBP).     azelastine  (ASTELIN ) 0.1 % nasal spray Place 1 spray into both nostrils 2 (two) times daily. Use in each nostril as directed 30 mL 1   Azelastine -Fluticasone  137-50 MCG/ACT SUSP Place 1 spray into the nose 2 (two) times daily as needed. 23 g 3   clindamycin  (CLEOCIN  T) 1 % lotion Apply to cleansed face daily in the morning. 60 mL 1   cyclobenzaprine  (FLEXERIL ) 10 MG tablet Take 1 tablet (10 mg total) by mouth 3 (three) times daily as needed for muscle spasms. 30  tablet 0   EPINEPHrine  (EPIPEN  2-PAK) 0.3 mg/0.3 mL IJ SOAJ injection Inject 0.3 mg into the muscle as needed for anaphylaxis. 2 each 1   fluticasone  (FLONASE ) 50 MCG/ACT nasal spray Place 1 spray into both nostrils daily as needed for allergies or rhinitis.     gabapentin  (NEURONTIN ) 100 MG capsule Take 1 capsule (100 mg total) by mouth 3 (three) times daily. 42 capsule 0   gabapentin  (NEURONTIN ) 300 MG capsule Take 1 capsule (300 mg total) by mouth 3 (three) times daily for 7 days. 21 capsule 0   hydrOXYzine  (ATARAX ) 25 MG tablet 25 mg at bedtime as needed (sleep).     ibuprofen  (ADVIL ) 600 MG tablet Take 1 tablet (600 mg total) by mouth every 6 (six) hours as needed for up to 14 days. 56 tablet 0   levocetirizine (XYZAL ) 5 MG tablet Take 1 tablet (5 mg total) by mouth every evening. 90 tablet 3   lidocaine  (LIDODERM ) 5 % Place 1 patch onto the skin daily. Remove & Discard patch within 12 hours or as directed by MD 14 patch 0   lisinopril (ZESTRIL) 10 MG tablet Take 10 mg by mouth daily as needed.     medroxyPROGESTERone  (DEPO-PROVERA ) 150 MG/ML injection Inject 1 mL (150 mg total) into the muscle every 3 (three) months. 1 mL 3   methocarbamol  (ROBAXIN ) 500 MG tablet Take  1 tablet (500 mg total) by mouth 3 (three) times daily for 14 days 42 tablet 0   montelukast  (SINGULAIR ) 10 MG tablet Take 10 mg by mouth at bedtime as needed (allergies).     ondansetron  (ZOFRAN ) 8 MG tablet Take 1 tablet (8 mg total) by mouth every 8 (eight) hours as needed for nausea or vomiting. 20 tablet 0   oxyCODONE  (OXY IR/ROXICODONE ) 5 MG immediate release tablet Take 1 tablet (5 mg total) by mouth every 4 (four) hours as needed for up to 7 days for severe pain (pain score 7-10). 20 tablet 0   polyethylene glycol (MIRALAX  / GLYCOLAX ) 17 g packet Take 17 g by mouth daily as needed for mild constipation.     rizatriptan  (MAXALT ) 10 MG tablet Take 1 tablet (10 mg total) by mouth as needed for migraine. May repeat in 2 hours  if needed.  Maximum 2 tablets in 24 hours. 10 tablet 5   valACYclovir  (VALTREX ) 1000 MG tablet Take 1,000 mg by mouth as needed (Outbreaks).     Vitamin D , Ergocalciferol , (DRISDOL) 1.25 MG (50000 UNIT) CAPS capsule Take 50,000 Units by mouth every 7 (seven) days.     pantoprazole  (PROTONIX ) 40 MG tablet Take 1 tablet (40 mg total) by mouth daily. 90 tablet 1   No current facility-administered medications for this visit.     Known medication allergies: Allergies  Allergen Reactions   Augmentin  [Amoxicillin -Pot Clavulanate] Nausea And Vomiting   Macrobid  [Nitrofurantoin ] Nausea And Vomiting   Metformin  And Related Nausea And Vomiting   Amoxicillin  Rash   Cefdinir  Itching    Physical examination: Blood pressure 122/76, pulse 99, temperature 98 F (36.7 C), temperature source Temporal, resp. rate 16, last menstrual period 06/14/2023, SpO2 97%.  General: Alert, interactive, in no acute distress. HEENT: PERRLA, TMs pearly gray, turbinates minimally edematous without discharge, post-pharynx non erythematous. Neck: Supple without lymphadenopathy. Lungs: Clear to auscultation without wheezing, rhonchi or rales. {no increased work of breathing. CV: Normal S1, S2 without murmurs. Abdomen: Nondistended, nontender. Skin: Warm and dry, without lesions or rashes. Extremities: Left foot in walking boot Neuro:   Grossly intact.  Diagnostics/Labs: None today  Assessment and plan: Allergic rhinitis LPRD   1. Allergen avoidance measures - grass pollen, dust mite, mold  2. Treat and prevent inflammation of airway:   A. Dymista  1 spray each nostril twice a day as needed for nasal congestion or drainage control.   With using nasal sprays point tip of bottle toward eye on same side nostril and lean head slightly forward for best technique.    B. Xyzal  5 mg daily as needed   3. Treat and prevent reflux:   A. Minimize any caffeine or chocolate consumption  B. Protonix -1 tablet daily as  needed for reflux   4. Continue on allergy  shots at maintenance dosing!  Bring your epipen  on your allergy  shot days.    5. Return to clinic in 6 months or sooner if needed  I appreciate the opportunity to take part in Trinetta 's care. Please do not hesitate to contact me with questions.  Sincerely,   Catha Clink, MD Allergy /Immunology [facility]

## 2023-08-13 ENCOUNTER — Other Ambulatory Visit (HOSPITAL_COMMUNITY): Payer: Self-pay

## 2023-08-16 ENCOUNTER — Other Ambulatory Visit (HOSPITAL_COMMUNITY): Payer: Self-pay

## 2023-08-18 ENCOUNTER — Other Ambulatory Visit (HOSPITAL_COMMUNITY): Payer: Self-pay

## 2023-08-19 ENCOUNTER — Encounter: Payer: Self-pay | Admitting: Podiatry

## 2023-08-19 ENCOUNTER — Ambulatory Visit (INDEPENDENT_AMBULATORY_CARE_PROVIDER_SITE_OTHER): Payer: Medicaid Other | Admitting: Podiatry

## 2023-08-19 VITALS — Ht 59.0 in | Wt 193.0 lb

## 2023-08-19 DIAGNOSIS — M62461 Contracture of muscle, right lower leg: Secondary | ICD-10-CM

## 2023-08-19 DIAGNOSIS — M62462 Contracture of muscle, left lower leg: Secondary | ICD-10-CM

## 2023-08-19 DIAGNOSIS — M722 Plantar fascial fibromatosis: Secondary | ICD-10-CM

## 2023-08-19 NOTE — Progress Notes (Signed)
  Subjective:  Patient ID: Sherri [name], female    DOB: February 08, 1986,  MRN: [id number]  No chief complaint on file.    38 y.o. female returns for post-op check.  Still notes some numbness and soreness on the left side of the foot  Review of Systems: Negative except as noted in the HPI. Denies N/V/F/Ch.   Objective:  There were no vitals filed for this visit. Body mass index is 38.98 kg/m. Constitutional Well developed. Well nourished.  Vascular Foot warm and well perfused. Capillary refill normal to all digits.  Calf is soft and supple, no posterior calf or knee pain, negative Homans' sign  Neurologic Normal speech. Oriented to person, place, and time. Epicritic sensation to light touch grossly present bilaterally.  Dermatologic Skin healing well without signs of infection. Skin edges well coapted without signs of infection.  Orthopedic: Tenderness to palpation noted about the surgical site.   Assessment:   1. Bilateral plantar fasciitis   2. Gastrocnemius equinus of right lower extremity   3. Gastrocnemius equinus of left lower extremity     Plan:  Patient was evaluated and treated and all questions answered.  S/p foot surgery left - Suture removed uneventfully.  Weightbearing as tolerated in cam boot.  We discussed using warm/ice contrast bath to see if this can alleviate any of her neuritic symptoms.  Expect physical therapy will help quite a bit when she starts next week.  Return in 3 weeks to reevaluate.   No follow-ups on file.

## 2023-08-20 ENCOUNTER — Other Ambulatory Visit: Payer: Self-pay

## 2023-08-20 ENCOUNTER — Other Ambulatory Visit (HOSPITAL_COMMUNITY): Payer: Self-pay

## 2023-08-23 ENCOUNTER — Encounter: Payer: Self-pay | Admitting: Obstetrics and Gynecology

## 2023-08-23 ENCOUNTER — Telehealth: Payer: Self-pay

## 2023-08-23 ENCOUNTER — Ambulatory Visit: Admitting: Obstetrics and Gynecology

## 2023-08-23 ENCOUNTER — Other Ambulatory Visit (HOSPITAL_COMMUNITY): Payer: Self-pay

## 2023-08-23 VITALS — BP 146/91 | HR 73 | Ht 59.0 in

## 2023-08-23 DIAGNOSIS — N920 Excessive and frequent menstruation with regular cycle: Secondary | ICD-10-CM | POA: Diagnosis not present

## 2023-08-23 MED ORDER — TRANEXAMIC ACID 650 MG PO TABS
1300.0000 mg | ORAL_TABLET | Freq: Three times a day (TID) | ORAL | 2 refills | Status: DC
  Filled 2023-08-23: qty 30, 5d supply, fill #0

## 2023-08-23 NOTE — Progress Notes (Signed)
 Weight not assessed due to surgical boot on left foot.  Surgical consult; pt states she wishes to have periods removed.

## 2023-08-23 NOTE — Progress Notes (Signed)
 38 yo P2 here for evaluation of menorrhagia with regular cycles. Patient reports a monthly 5-day period associated with heavy flow and dysmenorrhea. Patient has tried hormonal management with slynd  and depo-provera  and does not like how it makes her feel. Patient is not able to describe the feeling. She wishes to discontinue depo-provera  and discuss other options. 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    Urinary tract infection    Uterine fibroid    Past Surgical History:  Procedure Laterality Date   APPENDECTOMY     CESAREAN SECTION N/A 06/05/2014   Procedure: CESAREAN SECTION;  Surgeon: Lang JINNY Peel, DO;  Location: [facility];  Service: Obstetrics;  Laterality: N/A;   FOOT SURGERY     LAPAROSCOPIC APPENDECTOMY N/A 07/10/2023   Procedure: APPENDECTOMY, LAPAROSCOPIC;  Surgeon: Ann Fine, MD;  Location: [facility];  Service: General;  Laterality: N/A;   WISDOM TOOTH EXTRACTION     Family History  Problem Relation Age of Onset   Hypertension Mother    Hypertension Father    Breast cancer Maternal Grandmother        23   Colon cancer Neg Hx    Colon polyps Neg Hx    Kidney disease Neg Hx    Diabetes Neg Hx    Esophageal cancer Neg Hx    Gallbladder disease Neg Hx    Heart disease Neg Hx    Asthma Neg Hx    Stroke Neg Hx    Stomach cancer Neg Hx    ROS See pertinent in HPI. All other systems reviewed and non contributory Blood pressure (!) 146/91, pulse 73, height 4' 11 (1.499 m), last menstrual period 07/19/2023. GENERAL: Well-developed, well-nourished female in no acute distress.  NEURO: alert and oriented x 3  A/P 38 yo with menorrhagia with regular cycles Discussed medical management with Mirena IUD, megace or Lysteda.  Also discussed  surgical management with endometrial ablation. Patient very undecided and opted to trial Lysteda and schedule endometrial ablation knowing that she will need to wait until September for the procedure to be scheduled. Patient is also aware that she needs a pap smear and endometrial biopsy. Patient declined exam today as she received botox injection for management of overactive bladder yesterday Patient will schedule pap smear and endometrial biopsy prior to her ablation procedure

## 2023-08-23 NOTE — Telephone Encounter (Signed)
*  [facility]  Pharmacy Patient Advocate Encounter   Received notification from CoverMyMeds that prior authorization for Azelastine -Fluticasone  137-50MCG/ACT suspension  is required/requested.   Insurance verification completed.   The patient is insured through [facility] .   Per test claim:  Brand Dymista  is preferred by the insurance.  If suggested medication is appropriate, Please send in a new RX and discontinue this one. If not, please advise as to why it's not appropriate so that we may request a Prior Authorization. Please note, some preferred medications may still require a PA.  If the suggested medications have not been trialed and there are no contraindications to their use, the PA will not be submitted, as it will not be approved.   *being filled at [facility]-changed in system to preferred brand with a $4.00 co-pay

## 2023-08-24 ENCOUNTER — Other Ambulatory Visit: Payer: Self-pay | Admitting: Podiatry

## 2023-08-24 ENCOUNTER — Other Ambulatory Visit (HOSPITAL_COMMUNITY): Payer: Self-pay

## 2023-08-25 ENCOUNTER — Ambulatory Visit: Attending: Podiatry

## 2023-08-25 ENCOUNTER — Ambulatory Visit (INDEPENDENT_AMBULATORY_CARE_PROVIDER_SITE_OTHER): Payer: Self-pay

## 2023-08-25 ENCOUNTER — Other Ambulatory Visit: Payer: Self-pay

## 2023-08-25 ENCOUNTER — Other Ambulatory Visit (HOSPITAL_COMMUNITY): Payer: Self-pay

## 2023-08-25 ENCOUNTER — Telehealth: Payer: Self-pay | Admitting: Podiatry

## 2023-08-25 DIAGNOSIS — M722 Plantar fascial fibromatosis: Secondary | ICD-10-CM | POA: Diagnosis not present

## 2023-08-25 DIAGNOSIS — R600 Localized edema: Secondary | ICD-10-CM | POA: Insufficient documentation

## 2023-08-25 DIAGNOSIS — M79672 Pain in left foot: Secondary | ICD-10-CM | POA: Diagnosis present

## 2023-08-25 DIAGNOSIS — R262 Difficulty in walking, not elsewhere classified: Secondary | ICD-10-CM | POA: Diagnosis present

## 2023-08-25 DIAGNOSIS — M25672 Stiffness of left ankle, not elsewhere classified: Secondary | ICD-10-CM | POA: Insufficient documentation

## 2023-08-25 DIAGNOSIS — M62461 Contracture of muscle, right lower leg: Secondary | ICD-10-CM | POA: Insufficient documentation

## 2023-08-25 DIAGNOSIS — M62462 Contracture of muscle, left lower leg: Secondary | ICD-10-CM | POA: Insufficient documentation

## 2023-08-25 DIAGNOSIS — J309 Allergic rhinitis, unspecified: Secondary | ICD-10-CM

## 2023-08-25 MED ORDER — ACETAMINOPHEN 500 MG PO TABS
1000.0000 mg | ORAL_TABLET | Freq: Four times a day (QID) | ORAL | 0 refills | Status: AC | PRN
  Filled 2023-08-25: qty 112, 14d supply, fill #0

## 2023-08-25 MED ORDER — IBUPROFEN 600 MG PO TABS
600.0000 mg | ORAL_TABLET | Freq: Four times a day (QID) | ORAL | 0 refills | Status: DC | PRN
  Filled 2023-08-25: qty 56, 14d supply, fill #0

## 2023-08-25 MED ORDER — OXYCODONE HCL 5 MG PO TABS
5.0000 mg | ORAL_TABLET | ORAL | 0 refills | Status: AC | PRN
  Filled 2023-08-25: qty 20, 4d supply, fill #0

## 2023-08-25 NOTE — Addendum Note (Signed)
 Addended byBETHA MEDICINE, Sharnita Bogucki R on: 08/25/2023 01:05 PM   Modules accepted: Orders

## 2023-08-25 NOTE — Therapy (Signed)
 OUTPATIENT PHYSICAL THERAPY LOWER EXTREMITY EVALUATION   Patient Name: Sherri [name]  DOROTHY POLHEMUS MRN: [id number] DOB:14-Dec-1985, 38 y.o., female Today's Date: 08/25/2023  END OF SESSION: PT End of Session - 08/25/23 0930      Visit Number 1    Number of Visits 16    Date for PT Re-Evaluation 10/20/23    Authorization Type [employer]    PT Start Time 0930   pt arrived late    PT Stop Time 0958    PT Time Calculation (min) 28 min     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    Urinary tract infection    Uterine fibroid    Past Surgical History:  Procedure Laterality Date   APPENDECTOMY     CESAREAN SECTION N/A 06/05/2014   Procedure: CESAREAN SECTION;  Surgeon: Lang JINNY Peel, DO;  Location: [facility];  Service: Obstetrics;  Laterality: N/A;   FOOT SURGERY     LAPAROSCOPIC APPENDECTOMY N/A 07/10/2023   Procedure: APPENDECTOMY, LAPAROSCOPIC;  Surgeon: Ann Fine, MD;  Location: [facility];  Service: General;  Laterality: N/A;   WISDOM TOOTH EXTRACTION     Patient Active Problem List   Diagnosis Date Noted   Acute appendicitis 07/10/2023   Initiation of Depo Provera  06/29/2023   Numbness and tingling of hand 05/13/2023   Moderate episode of recurrent major depressive disorder (HCC) 07/16/2022   Pre-diabetes 07/23/2021   Anxiety and depression 06/24/2021   Fibromyalgia 06/24/2021   Chronic sinusitis 12/26/2020   Sinus pressure 12/26/2020   Prediabetes 07/05/2020   Gastroesophageal reflux disease 04/26/2020   Perianal rash 03/26/2020   Chronic nausea 03/26/2020   Chronic constipation 03/09/2019   Pelvic pain in female 03/09/2019   Hypertension    Genital herpes    Uterine fibroid 12/25/2013   Obesity 12/25/2013   Bacterial vaginitis 04/27/2012    PCP: Doreene Maude JINNY, MD    REFERRING PROVIDER: Silva Juliene SAUNDERS, DPM  REFERRING DIAG: Bilateral plantar fasciitis [M72.2], 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 Patient is status post endoscopic gastroc recession and plantar fasciotomy bilateral, would like to include ROM, strengthening, stability, and tissue manipulation as needed and conditioning.  Weightbearing as tolerated, start therapy after June 23. Modalities PRN at therapist's discretion.    THERAPY DIAG:  Pain in Left Foot Difficulty in walking, not elsewhere classified Stiffness of Left ankle, not elsewhere classified Localized edema  Rationale for Evaluation and Treatment: Rehabilitation  ONSET DATE: 07/30/2023  SUBJECTIVE:  SUBJECTIVE STATEMENT: Patient presents to our clinic today approx 4 weeks s/p LEFT endoscopic gastroc recession and plantar fasciotomy. She has history of same procedure on right in 4/4 with subsequent PT at our location. She states that she has not begun working on any exercises prior to today's evaluation. She is WBAT in boot and primarily using her knee scooter at home.    PERTINENT HISTORY: Relevant PMHx includes DM, Fibromyalgia, HTN, Depression, Obesity  Patient scheduled for L Carpal Tunnel Release procedure on 09/01/2023.  PAIN:  Are you having pain?  Yes: NPRS scale: 8/10 Pain location: L posterior ankle  Pain description: soreness Aggravating factors: worse  Relieving factors: rest  PRECAUTIONS: None  RED FLAGS: None   WEIGHT BEARING RESTRICTIONS: Yes WBAT with boot   FALLS:  Has patient fallen in last 6 months? 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 To be able to perform household cleaning.   NEXT MD VISIT: 09/09/2023  OBJECTIVE:  Note: Objective measures were completed at Evaluation unless otherwise noted.   PATIENT SURVEYS:  LEFS: 52/80  COGNITION: Overall cognitive status: Within functional limits for tasks assessed     SENSATION: Not tested  LOWER EXTREMITY ROM:  Active ROM Right eval Left eval  Hip flexion    Hip extension    Hip abduction    Hip adduction    Hip internal rotation    Hip external rotation    Knee flexion    Knee extension    Ankle dorsiflexion -12   Ankle plantarflexion 35   Ankle inversion 22   Ankle eversion 5    (Blank rows = not tested)  LOWER EXTREMITY MMT:  MMT Right eval Left eval  Hip flexion    Hip extension    Hip abduction    Hip adduction    Hip internal rotation    Hip external rotation    Knee flexion    Knee extension    Ankle dorsiflexion    Ankle plantarflexion    Ankle inversion    Ankle eversion     (Blank rows = not tested)   FUNCTIONAL TESTS:  2 minute walk test: deferred  GAIT: Distance walked: 50 ft Assistive device utilized: Knee Scooter Level of assistance: Modified independence Comments: Deferred formal gait assessment d/t patient has been ambulating with knee scooter since surgery.                                                                                                                                 TREATMENT DATE:   [facility] Adult PT Treatment:  DATE: 08/25/23  Initial evaluation: see patient education and home exercise program as noted below      PATIENT EDUCATION:  Education  details: reviewed initial home exercise program; discussion of POC, prognosis and goals for skilled PT   Person educated: Patient Education method: Explanation, Demonstration, and Handouts Education comprehension: verbalized understanding, returned demonstration, and needs further education  HOME EXERCISE PROGRAM: Access Code: [email or web address] URL: [email or web address] Date: 08/25/2023 Prepared by: Marko Molt  Exercises - Supine Ankle Pumps  - 2 x daily - 7 x weekly - 2 sets - 10 reps - Ankle Inversion Eversion Towel Slide  - 2 x daily - 7 x weekly - 2 sets - 10 reps - Seated Ankle Circles  - 2 x daily - 7 x weekly - 2 sets - 10 reps - Seated Toe Curl  - 2 x daily - 7 x weekly - 2 sets - 10 reps - Toe Spreading  - 2 x daily - 7 x weekly - 2 sets - 10 reps  ASSESSMENT:  CLINICAL IMPRESSION: Sherri [name]  is a 38 y.o. female who was seen today for physical therapy evaluation and treatment for L ankle pain with mobility deficts, 4 weeks s/p endoscopic gastroc recession and plantar fasciotomy. She is demonstrating decreased ankle ROM and has altered gait mechanics. She requires skilled PT services at this time to address relevant deficits, including ankle stability, balance and gait mechanics, and improve overall function.     OBJECTIVE IMPAIRMENTS: Abnormal gait, decreased activity tolerance, decreased balance, decreased ROM, decreased strength, increased edema, and pain.   ACTIVITY LIMITATIONS: carrying, lifting, standing, squatting, stairs, and transfers  PARTICIPATION LIMITATIONS: meal prep, cleaning, shopping, community activity, and occupation  PERSONAL FACTORS: Past/current experiences and 3+ comorbidities: DM, Fibromyalgia, HTN, Depression, Obesity are also affecting patient's functional outcome.   REHAB POTENTIAL: Fair    CLINICAL DECISION MAKING: Evolving/moderate complexity  EVALUATION COMPLEXITY: Moderate   GOALS: Goals reviewed with patient? YES  SHORT TERM  GOALS: Target date: 09/22/2023   Patient will be independent with initial home program at least 3 days/week.  Baseline: provided at eval Goal Status: INITIAL   2.  Patient will demonstrate improved L ankle DF AROM by at least 5 degrees.  Baseline: lacking 12 degrees of DF  Goal Status: INITIAL   3.  Patient will report ambulating with BIL crutches or LRAD >75% of the day.  Baseline: Utilizing knee scooter for household and community ambulation Goal status: INITIAL   LONG TERM GOALS: Target date: 10/02/2023   Patient will report improved overall functional ability with LEFS score of 65 or greater.  Baseline: 52/80 Goal Status: INITIAL    2.  Patient will demonstrate improved L ankle DF AROM to at least 0-5 degrees.  Baseline: lacking 12 degrees of DF Goal status: INITIAL  3.  Patient will demonstrate ability to tolerate independent ambulation for at least 10 minutes.  Baseline: see gait assessment  Goal status: INITIAL  4.  Patient will demonstrate ability to ascend and descend stair height of at least 6 x 15 with minimal-to-no pain Baseline: severe pain/difficulty  Goal status: INITIAL      PLAN:  PT FREQUENCY: 1-2x/week  PT DURATION: 8 weeks  PLANNED INTERVENTIONS: 97164- PT Re-evaluation, 97750- Physical Performance Testing, 97110-Therapeutic exercises, 97530- Therapeutic activity, [id number]- Neuromuscular re-education, 97535- Self Care, [id number]- Manual therapy, [id number]- Gait training, [phone]- Aquatic Therapy, [phone]- Electrical stimulation (unattended), 97016- Vasopneumatic device, [id number]- Ionotophoresis 4mg /ml Dexamethasone , Patient/Family education, Balance training, Stair training, Cryotherapy, and Moist heat  [employer]  Authorization   Choose one: Rehabilitative  Standardized Assessment or Functional Outcome Tool: See Pain Assessment and LEFS  Score or Percent Disability: 52/80  Body Parts Treated (Select each separately):  Ankle. Overall deficits/functional  limitations for body part selected: moderate   If treatment provided at initial evaluation, no treatment charged due to lack of authorization.    PLAN FOR NEXT SESSION: progress as appropriate for post op status, including ankle AROM, strengthening, gait training, manual therapy and further assessment as indicated.    Marko Molt, PT, DPT  08/27/2023 7:40 AM

## 2023-08-25 NOTE — Telephone Encounter (Signed)
 Patient requests refill of tylenol ,ibuprofen , and oxycodone .  Sherri [name] - [facility] Phone: [phone]  Fax: [phone]

## 2023-08-26 ENCOUNTER — Other Ambulatory Visit (HOSPITAL_COMMUNITY): Payer: Self-pay

## 2023-08-28 ENCOUNTER — Ambulatory Visit: Admitting: Physical Therapy

## 2023-08-28 ENCOUNTER — Encounter: Payer: Self-pay | Admitting: Physical Therapy

## 2023-08-28 DIAGNOSIS — R262 Difficulty in walking, not elsewhere classified: Secondary | ICD-10-CM

## 2023-08-28 DIAGNOSIS — M25672 Stiffness of left ankle, not elsewhere classified: Secondary | ICD-10-CM

## 2023-08-28 DIAGNOSIS — M79672 Pain in left foot: Secondary | ICD-10-CM | POA: Diagnosis not present

## 2023-08-28 NOTE — Therapy (Signed)
 OUTPATIENT PHYSICAL THERAPY LOWER EXTREMITY EVALUATION   Patient Name: Sherri [name] MRN: [id number] DOB:[birth year], 38 y.o., female Today's Date: 08/28/2023   PT End of Session - 08/28/23 1116     Visit Number 2    Number of Visits 16    Date for PT Re-Evaluation 10/20/23    Authorization Type [facility]    Authorization Time Period Approved 8 PT visits from 07/02/23-08/31/23    Authorization - Visit Number 2    Authorization - Number of Visits 8    PT Start Time 1115    PT Stop Time 1156    PT Time Calculation (min) 41 min          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    Urinary tract infection    Uterine fibroid    Past Surgical History:  Procedure Laterality Date   APPENDECTOMY     CESAREAN SECTION N/A 06/05/2014   Procedure: CESAREAN SECTION;  Surgeon: Lang JINNY Peel, DO;  Location: [facility];  Service: Obstetrics;  Laterality: N/A;   FOOT SURGERY     LAPAROSCOPIC APPENDECTOMY N/A 07/10/2023   Procedure: APPENDECTOMY, LAPAROSCOPIC;  Surgeon: Ann Fine, MD;  Location: [facility];  Service: General;  Laterality: N/A;   WISDOM TOOTH EXTRACTION     Patient Active Problem List   Diagnosis Date Noted   Acute appendicitis 07/10/2023   Initiation of Depo Provera  06/29/2023   Numbness and tingling of hand 05/13/2023   Moderate episode of recurrent major depressive disorder (HCC) 07/16/2022   Pre-diabetes 07/23/2021   Anxiety and depression 06/24/2021   Fibromyalgia 06/24/2021   Chronic sinusitis 12/26/2020   Sinus pressure 12/26/2020   Prediabetes 07/05/2020   Gastroesophageal reflux disease 04/26/2020   Perianal rash 03/26/2020   Chronic nausea 03/26/2020   Chronic constipation 03/09/2019   Pelvic pain in female 03/09/2019   Hypertension    Genital herpes    Uterine  fibroid 12/25/2013   Obesity 12/25/2013   Bacterial vaginitis 04/27/2012    PCP: Doreene Maude JINNY, MD   REFERRING PROVIDER: Silva Juliene SAUNDERS, DPM  REFERRING DIAG: Bilateral plantar fasciitis [M72.2], 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 Patient is status post endoscopic gastroc recession and plantar fasciotomy bilateral, would like to include ROM, strengthening, stability, and tissue manipulation as needed and conditioning.  Weightbearing as tolerated, start therapy after June 23. Modalities PRN at therapist's discretion.    THERAPY DIAG:  Pain in Left Foot Difficulty in walking, not elsewhere classified Stiffness of Left ankle, not elsewhere classified Localized edema  Rationale for Evaluation and Treatment: Rehabilitation  ONSET DATE: 07/30/2023  SUBJECTIVE:  SUBJECTIVE STATEMENT: Patient presents to our clinic today approx 4 weeks s/p LEFT endoscopic gastroc recession and plantar fasciotomy. She has history of same procedure on right in 4/4 with subsequent PT at our location. She states that she has not begun working on any exercises prior to today's evaluation. She is WBAT in boot and primarily using her knee scooter at home.    PERTINENT HISTORY: Relevant PMHx includes DM, Fibromyalgia, HTN, Depression, Obesity  Patient scheduled for L Carpal Tunnel Release procedure on 09/01/2023.  PAIN:  Are you having pain?  Yes: NPRS scale: 8/10 Pain location: L posterior ankle  Pain description: soreness Aggravating factors: worse  Relieving factors: rest  PRECAUTIONS: None  RED FLAGS: None   WEIGHT BEARING RESTRICTIONS: Yes WBAT with boot    FALLS:  Has patient fallen in last 6 months? 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 To be able to perform household cleaning.   NEXT MD VISIT: 09/09/2023  OBJECTIVE:  Note: Objective measures were completed at Evaluation unless otherwise noted.   PATIENT SURVEYS:  LEFS: 52/80  COGNITION: Overall cognitive status: Within functional limits for tasks assessed     SENSATION: Not tested  LOWER EXTREMITY ROM:  Active ROM Right eval Left eval  Hip flexion    Hip extension    Hip abduction    Hip adduction    Hip internal rotation    Hip external rotation    Knee flexion    Knee extension    Ankle dorsiflexion -12   Ankle plantarflexion 35   Ankle inversion 22   Ankle eversion 5    (Blank rows = not tested)  LOWER EXTREMITY MMT:  MMT Right eval Left eval  Hip flexion    Hip extension    Hip abduction    Hip adduction    Hip internal rotation    Hip external rotation    Knee flexion    Knee extension    Ankle dorsiflexion    Ankle plantarflexion    Ankle inversion    Ankle eversion     (Blank rows = not tested)   FUNCTIONAL TESTS:  2 minute walk test: deferred  GAIT: Distance walked: 50 ft Assistive device utilized: Knee Scooter Level of assistance: Modified independence Comments: Deferred formal gait assessment d/t patient has been ambulating with knee scooter since surgery.                                                                                                                                 TREATMENT DATE:   [facility] Adult PT Treatment:  DATE: 08/28/23  Therex: nu-step L3 39m while  taking subjective and planning session with patient Towel scrunches BAPS board L3 - 20x ea DF/PF Supine ankle ABC's Towel scrunches Reviewing HEP  Therapeutic Activity  Walking in // fwd and lat with cues to put some weight through mid foot     HOME EXERCISE PROGRAM: Access Code: [email or web address] URL: [email or web address] Date: 08/25/2023 Prepared by: Marko Molt  Exercises - Supine Ankle Pumps  - 2 x daily - 7 x weekly - 2 sets - 10 reps - Ankle Inversion Eversion Towel Slide  - 2 x daily - 7 x weekly - 2 sets - 10 reps - Seated Ankle Circles  - 2 x daily - 7 x weekly - 2 sets - 10 reps - Seated Toe Curl  - 2 x daily - 7 x weekly - 2 sets - 10 reps - Toe Spreading  - 2 x daily - 7 x weekly - 2 sets - 10 reps  ASSESSMENT:  CLINICAL IMPRESSION:   08/28/2023:  Sherri [name]  tolerated session fair d/t pain with no adverse reaction.  Concentrated on w/b and ankle ROM.  Encouraged her to work on HEP regularly and w/b in boot at home.   EVAL: Sherri [name]  is a 38 y.o. female who was seen today for physical therapy evaluation and treatment for L ankle pain with mobility deficts, 4 weeks s/p endoscopic gastroc recession and plantar fasciotomy. She is demonstrating decreased ankle ROM and has altered gait mechanics. She requires skilled PT services at this time to address relevant deficits, including ankle stability, balance and gait mechanics, and improve overall function.     OBJECTIVE IMPAIRMENTS: Abnormal gait, decreased activity tolerance, decreased balance, decreased ROM, decreased strength, increased edema, and pain.   ACTIVITY LIMITATIONS: carrying, lifting, standing, squatting, stairs, and transfers  PARTICIPATION LIMITATIONS: meal prep, cleaning, shopping, community activity, and occupation  PERSONAL FACTORS: Past/current experiences and 3+ comorbidities: DM, Fibromyalgia, HTN, Depression, Obesity are also affecting patient's functional outcome.   REHAB POTENTIAL: Fair     CLINICAL DECISION MAKING: Evolving/moderate complexity  EVALUATION COMPLEXITY: Moderate   GOALS: Goals reviewed with patient? YES  SHORT TERM GOALS: Target date: 09/22/2023   Patient will be independent with initial home program at least 3 days/week.  Baseline: provided at eval Goal Status: INITIAL   2.  Patient will demonstrate improved L ankle DF AROM by at least 5 degrees.  Baseline: lacking 12 degrees of DF  Goal Status: INITIAL   3.  Patient will report ambulating with BIL crutches or LRAD >75% of the day.  Baseline: Utilizing knee scooter for household and community ambulation Goal status: INITIAL   LONG TERM GOALS: Target date: 10/02/2023   Patient will report improved overall functional ability with LEFS score of 65 or greater.  Baseline: 52/80 Goal Status: INITIAL    2.  Patient will demonstrate improved L ankle DF AROM to at least 0-5 degrees.  Baseline: lacking 12 degrees of DF Goal status: INITIAL  3.  Patient will demonstrate ability to tolerate independent ambulation for at least 10 minutes.  Baseline: see gait assessment  Goal status: INITIAL  4.  Patient will demonstrate ability to ascend and descend stair height of at least 6 x 15 with minimal-to-no pain Baseline: severe pain/difficulty  Goal status: INITIAL      PLAN:  PT FREQUENCY: 1-2x/week  PT DURATION: 8 weeks  PLANNED INTERVENTIONS: 97164- PT Re-evaluation, 97750- Physical Performance Testing, 97110-Therapeutic exercises, 97530- Therapeutic activity, [id number]- Neuromuscular re-education, 97535- Self Care, 02859-  Manual therapy, [id number]- Gait training, 02886- Aquatic Therapy, [phone]- Electrical stimulation (unattended), 97016- Vasopneumatic device, [phone]- Ionotophoresis 4mg /ml Dexamethasone , Patient/Family education, Balance training, Stair training, Cryotherapy, and Moist heat  [employer] Authorization   Choose one: Rehabilitative  Standardized Assessment or Functional Outcome Tool: See  Pain Assessment and LEFS  Score or Percent Disability: 52/80  Body Parts Treated (Select each separately):  Ankle. Overall deficits/functional limitations for body part selected: moderate   If treatment provided at initial evaluation, no treatment charged due to lack of authorization.    PLAN FOR NEXT SESSION: progress as appropriate for post op status, including ankle AROM, strengthening, gait training, manual therapy and further assessment as indicated.    Cheryn Lundquist E Lorree Millar PT 08/28/2023 12:32 PM

## 2023-08-30 ENCOUNTER — Other Ambulatory Visit (HOSPITAL_COMMUNITY): Payer: Self-pay

## 2023-08-30 DIAGNOSIS — J3089 Other allergic rhinitis: Secondary | ICD-10-CM | POA: Diagnosis not present

## 2023-08-30 NOTE — Progress Notes (Signed)
 VIALS MADE 08-30-23

## 2023-08-31 ENCOUNTER — Ambulatory Visit: Payer: Self-pay | Attending: Podiatry

## 2023-08-31 DIAGNOSIS — M25672 Stiffness of left ankle, not elsewhere classified: Secondary | ICD-10-CM | POA: Diagnosis present

## 2023-08-31 DIAGNOSIS — R600 Localized edema: Secondary | ICD-10-CM | POA: Insufficient documentation

## 2023-08-31 DIAGNOSIS — M79672 Pain in left foot: Secondary | ICD-10-CM | POA: Insufficient documentation

## 2023-08-31 DIAGNOSIS — J301 Allergic rhinitis due to pollen: Secondary | ICD-10-CM | POA: Diagnosis not present

## 2023-08-31 DIAGNOSIS — R262 Difficulty in walking, not elsewhere classified: Secondary | ICD-10-CM | POA: Insufficient documentation

## 2023-08-31 NOTE — Therapy (Signed)
 OUTPATIENT PHYSICAL THERAPY LOWER EXTREMITY EVALUATION   Patient Name: Sherri  [name] MRN: [id number] DOB:17-Oct-1985, 38 y.o., female Today's Date: 08/31/2023   PT End of Session - 08/31/23 0918     Visit Number 3    Number of Visits 16    Date for PT Re-Evaluation 10/20/23    Authorization Type [facility]    Authorization Time Period Approved 8 PT visits from 07/02/23-08/31/23    Authorization - Visit Number 3    Authorization - Number of Visits 8    PT Start Time 0915    PT Stop Time 0953    PT Time Calculation (min) 38 min    Activity Tolerance Patient tolerated treatment well    Behavior During Therapy WFL for tasks assessed/performed           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    Urinary tract infection    Uterine fibroid    Past Surgical History:  Procedure Laterality Date   APPENDECTOMY     CESAREAN SECTION N/A 06/05/2014   Procedure: CESAREAN SECTION;  Surgeon: Sherri [name] Peel, DO;  Location: [facility];  Service: Obstetrics;  Laterality: N/A;   FOOT SURGERY     LAPAROSCOPIC APPENDECTOMY N/A 07/10/2023   Procedure: APPENDECTOMY, LAPAROSCOPIC;  Surgeon: Sherri Fine, MD;  Location: [facility];  Service: General;  Laterality: N/A;   WISDOM TOOTH EXTRACTION     Patient Active Problem List   Diagnosis Date Noted   Acute appendicitis 07/10/2023   Initiation of Depo Provera  06/29/2023   Numbness and tingling of hand 05/13/2023   Moderate episode of recurrent major depressive disorder (HCC) 07/16/2022   Pre-diabetes 07/23/2021   Anxiety and depression 06/24/2021   Fibromyalgia 06/24/2021   Chronic sinusitis 12/26/2020   Sinus pressure 12/26/2020   Prediabetes 07/05/2020   Gastroesophageal reflux disease 04/26/2020   Perianal rash 03/26/2020   Chronic nausea 03/26/2020    Chronic constipation 03/09/2019   Pelvic pain in female 03/09/2019   Hypertension    Genital herpes    Uterine fibroid 12/25/2013   Obesity 12/25/2013   Bacterial vaginitis 04/27/2012    PCP: Sherri Maude JINNY, MD   REFERRING PROVIDER: Silva Juliene [name], DPM  REFERRING DIAG: Bilateral plantar fasciitis [M72.2], 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 Patient is status post endoscopic gastroc recession and plantar fasciotomy bilateral, would like to include ROM, strengthening, stability, and tissue manipulation as needed and conditioning.  Weightbearing as tolerated, start therapy after June 23. Modalities PRN at therapist's discretion.    THERAPY DIAG:  Pain in Left Foot Difficulty in walking, not elsewhere classified Stiffness of Left ankle, not elsewhere classified Localized edema  Rationale for Evaluation and Treatment: Rehabilitation  ONSET DATE: 07/30/2023  SUBJECTIVE:  SUBJECTIVE STATEMENT: 08/31/2023 Patient reporting that she felt good after last session. She has continued to work on [employer] with boot today.    Patient presents to our clinic today approx 4 weeks s/p LEFT endoscopic gastroc recession and plantar fasciotomy. She has history of same procedure on right in 4/4 with subsequent PT at our location. She states that she has not begun working on any exercises prior to today's evaluation. She is WBAT in boot and primarily using her knee scooter at home.    PERTINENT HISTORY: Relevant PMHx includes DM, Fibromyalgia, HTN, Depression, Obesity  Patient scheduled for L Carpal Tunnel Release procedure on 09/01/2023.  PAIN:  Are you having  pain?  Yes: NPRS scale: 8/10 Pain location: L posterior ankle  Pain description: soreness Aggravating factors: worse  Relieving factors: rest  PRECAUTIONS: None  RED FLAGS: None   WEIGHT BEARING RESTRICTIONS: Yes WBAT with boot   FALLS:  Has patient fallen in last 6 months? 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 To be able to perform household cleaning.   NEXT MD VISIT: 09/09/2023  OBJECTIVE:  Note: Objective measures were completed at Evaluation unless otherwise noted.   PATIENT SURVEYS:  LEFS: 52/80  COGNITION: Overall cognitive status: Within functional limits for tasks assessed     SENSATION: Not tested  LOWER EXTREMITY ROM:  Active ROM Right eval Left eval  Hip flexion    Hip extension    Hip abduction    Hip adduction    Hip internal rotation    Hip external rotation    Knee flexion    Knee extension    Ankle dorsiflexion -12   Ankle plantarflexion 35   Ankle inversion 22   Ankle eversion 5    (Blank rows = not tested)  LOWER EXTREMITY MMT:  MMT Right eval Left eval  Hip flexion    Hip extension    Hip abduction    Hip adduction    Hip internal rotation    Hip external rotation    Knee flexion    Knee extension    Ankle dorsiflexion    Ankle plantarflexion    Ankle inversion    Ankle eversion     (Blank rows = not tested)   FUNCTIONAL TESTS:  2 minute walk test: deferred  GAIT: Distance walked: 50 ft Assistive device utilized: Knee Scooter Level of assistance: Modified independence Comments: Deferred formal gait assessment d/t patient has been ambulating with knee scooter since surgery.  TREATMENT DATE:   [facility] Adult PT Treatment:                                                DATE: 08/31/2023  Therex: nu-step L3 82m while taking subjective and planning session with patient Towel scrunches 3 x 30 sec  BAPS board L3 - 20x ea DF/PF Supine ankle ABC's  Therapeutic Activity Walking x 50 feet; including gait training with 3 pt and 2 pt gait using R axillary crutch   Self-Care: Discussion strategy and equipment for don/doff compression socks     HOME EXERCISE PROGRAM: Access Code: [email or web address] URL: [email or web address] Date: 08/25/2023 Prepared by: Marko Molt  Exercises - Supine Ankle Pumps  - 2 x daily - 7 x weekly - 2 sets - 10 reps - Ankle Inversion Eversion Towel Slide  - 2 x daily - 7 x weekly - 2 sets - 10 reps - Seated Ankle Circles  - 2 x daily - 7 x weekly - 2 sets - 10 reps - Seated Toe Curl  - 2 x daily - 7 x weekly - 2 sets - 10 reps - Toe Spreading  - 2 x daily - 7 x weekly - 2 sets - 10 reps  ASSESSMENT:  CLINICAL IMPRESSION:   08/31/2023:  Ysabela  had good tolerance of today's treatment session, which focused on L ankle ROM activities and gait training with R axillary crutch to prepare for post op mobility. Patient had some difficulty with ankle plantar flexion AAROM. We will continue to progress per POC as tolerated, in order to reach established rehab goals.    EVAL: Larri  is a 38 y.o. female who was seen today for physical therapy evaluation and treatment for L ankle pain with mobility deficts, 4 weeks s/p endoscopic gastroc recession and plantar fasciotomy. She is demonstrating decreased ankle ROM and has altered gait mechanics. She requires skilled PT services at this time to address relevant deficits, including ankle stability, balance and gait mechanics, and improve overall function.     OBJECTIVE IMPAIRMENTS: Abnormal gait, decreased  activity tolerance, decreased balance, decreased ROM, decreased strength, increased edema, and pain.   ACTIVITY LIMITATIONS: carrying, lifting, standing, squatting, stairs, and transfers  PARTICIPATION LIMITATIONS: meal prep, cleaning, shopping, community activity, and occupation  PERSONAL FACTORS: Past/current experiences and 3+ comorbidities: DM, Fibromyalgia, HTN, Depression, Obesity are also affecting patient's functional outcome.   REHAB POTENTIAL: Fair    CLINICAL DECISION MAKING: Evolving/moderate complexity  EVALUATION COMPLEXITY: Moderate   GOALS: Goals reviewed with patient? YES  SHORT TERM GOALS: Target date: 09/22/2023   Patient will be independent with initial home program at least 3 days/week.  Baseline: provided at eval Goal Status: INITIAL   2.  Patient will demonstrate improved L ankle DF AROM by at least 5 degrees.  Baseline: lacking 12 degrees of DF  Goal Status: INITIAL   3.  Patient will report ambulating with BIL crutches or LRAD >75% of the day.  Baseline: Utilizing knee scooter for household and community ambulation Goal status: INITIAL   LONG TERM GOALS: Target date: 10/02/2023   Patient will report improved overall functional ability with LEFS score of 65 or greater.  Baseline: 52/80 Goal Status: INITIAL    2.  Patient will demonstrate improved L ankle DF AROM to at least 0-5 degrees.  Baseline: lacking 12 degrees of DF Goal status: INITIAL  3.  Patient will demonstrate ability to tolerate independent ambulation for at least 10 minutes.  Baseline: see gait assessment  Goal status: INITIAL  4.  Patient will demonstrate ability to ascend and descend stair height of at least 6 x 15 with minimal-to-no pain Baseline: severe pain/difficulty  Goal status: INITIAL      PLAN:  PT FREQUENCY: 1-2x/week  PT DURATION: 8 weeks  PLANNED INTERVENTIONS: 97164- PT Re-evaluation, 97750- Physical Performance Testing, 97110-Therapeutic exercises,  97530- Therapeutic activity, 97112- Neuromuscular re-education, 97535- Self Care, 02859- Manual therapy, [id number]- Gait training, [phone]- Aquatic Therapy, [phone]- Electrical stimulation (unattended), 97016- Vasopneumatic device, [id number]- Ionotophoresis 4mg /ml Dexamethasone , Patient/Family education, Balance training, Stair training, Cryotherapy, and Moist heat  [employer] Authorization   Choose one: Rehabilitative  Standardized Assessment or Functional Outcome Tool: See Pain Assessment and LEFS  Score or Percent Disability: 52/80  Body Parts Treated (Select each separately):  Ankle. Overall deficits/functional limitations for body part selected: moderate   If treatment provided at initial evaluation, no treatment charged due to lack of authorization.    PLAN FOR NEXT SESSION: progress as appropriate for post op status, including ankle AROM, strengthening, gait training, manual therapy and further assessment as indicated.    Marko Molt, PT, DPT  08/31/2023 10:03 AM

## 2023-09-01 ENCOUNTER — Other Ambulatory Visit (HOSPITAL_COMMUNITY): Payer: Self-pay

## 2023-09-04 ENCOUNTER — Other Ambulatory Visit: Payer: Self-pay | Admitting: Podiatry

## 2023-09-04 ENCOUNTER — Other Ambulatory Visit (HOSPITAL_COMMUNITY): Payer: Self-pay

## 2023-09-06 ENCOUNTER — Telehealth: Payer: Self-pay | Admitting: Podiatry

## 2023-09-06 ENCOUNTER — Other Ambulatory Visit (HOSPITAL_COMMUNITY): Payer: Self-pay

## 2023-09-06 MED ORDER — IBUPROFEN 600 MG PO TABS
600.0000 mg | ORAL_TABLET | Freq: Four times a day (QID) | ORAL | 0 refills | Status: AC | PRN
  Filled 2023-09-06 – 2023-09-07 (×3): qty 56, 14d supply, fill #0

## 2023-09-06 MED ORDER — OXYCODONE HCL 5 MG PO TABS
5.0000 mg | ORAL_TABLET | ORAL | 0 refills | Status: AC | PRN
  Filled 2023-09-06 – 2023-09-07 (×2): qty 20, 4d supply, fill #0

## 2023-09-06 NOTE — Telephone Encounter (Signed)
 Patient states her medications didn't have any refills left on the medications she asked for and she needs new script to be called in.

## 2023-09-07 ENCOUNTER — Other Ambulatory Visit: Payer: Self-pay

## 2023-09-07 ENCOUNTER — Other Ambulatory Visit (HOSPITAL_COMMUNITY): Payer: Self-pay

## 2023-09-07 ENCOUNTER — Ambulatory Visit: Payer: Self-pay

## 2023-09-07 ENCOUNTER — Other Ambulatory Visit: Payer: Self-pay | Admitting: Podiatry

## 2023-09-07 ENCOUNTER — Other Ambulatory Visit (HOSPITAL_COMMUNITY): Payer: Self-pay | Admitting: Emergency Medicine

## 2023-09-07 DIAGNOSIS — R262 Difficulty in walking, not elsewhere classified: Secondary | ICD-10-CM

## 2023-09-07 DIAGNOSIS — M79672 Pain in left foot: Secondary | ICD-10-CM

## 2023-09-07 DIAGNOSIS — M25672 Stiffness of left ankle, not elsewhere classified: Secondary | ICD-10-CM

## 2023-09-07 NOTE — Therapy (Signed)
 OUTPATIENT PHYSICAL THERAPY NOTE   Patient Name: Sherri [name]  NEENAH CANTER MRN: [id number] DOB:Nov 14, 1985, 38 y.o., female Today's Date: 09/07/2023   PT End of Session - 09/07/23 0929     Visit Number 4    Number of Visits 16    Date for PT Re-Evaluation 10/20/23    Authorization Type [facility]    Authorization Time Period Approved 8 PT visits from 07/02/23-08/31/23    Authorization - Visit Number 4    Authorization - Number of Visits 8    PT Start Time 0918    PT Stop Time 0956    PT Time Calculation (min) 38 min    Activity Tolerance Patient tolerated treatment well    Behavior During Therapy WFL for tasks assessed/performed            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    Urinary tract infection    Uterine fibroid    Past Surgical History:  Procedure Laterality Date   APPENDECTOMY     CESAREAN SECTION N/A 06/05/2014   Procedure: CESAREAN SECTION;  Surgeon: Lang JINNY Peel, DO;  Location: [facility];  Service: Obstetrics;  Laterality: N/A;   FOOT SURGERY     LAPAROSCOPIC APPENDECTOMY N/A 07/10/2023   Procedure: APPENDECTOMY, LAPAROSCOPIC;  Surgeon: Ann Fine, MD;  Location: [facility];  Service: General;  Laterality: N/A;   WISDOM TOOTH EXTRACTION     Patient Active Problem List   Diagnosis Date Noted   Acute appendicitis 07/10/2023   Initiation of Depo Provera  06/29/2023   Numbness and tingling of hand 05/13/2023   Moderate episode of recurrent major depressive disorder (HCC) 07/16/2022   Pre-diabetes 07/23/2021   Anxiety and depression 06/24/2021   Fibromyalgia 06/24/2021   Chronic sinusitis 12/26/2020   Sinus pressure 12/26/2020   Prediabetes 07/05/2020   Gastroesophageal reflux disease 04/26/2020   Perianal rash 03/26/2020   Chronic nausea 03/26/2020   Chronic  constipation 03/09/2019   Pelvic pain in female 03/09/2019   Hypertension    Genital herpes    Uterine fibroid 12/25/2013   Obesity 12/25/2013   Bacterial vaginitis 04/27/2012    PCP: Doreene Maude JINNY, MD   REFERRING PROVIDER: Silva Juliene SAUNDERS, DPM  REFERRING DIAG: Bilateral plantar fasciitis [M72.2], 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 Patient is status post endoscopic gastroc recession and plantar fasciotomy bilateral, would like to include ROM, strengthening, stability, and tissue manipulation as needed and conditioning.  Weightbearing as tolerated, start therapy after June 23. Modalities PRN at therapist's discretion.    THERAPY DIAG:  Pain in Left Foot Difficulty in walking, not elsewhere classified Stiffness of Left ankle, not elsewhere classified Localized edema  Rationale for Evaluation and Treatment: Rehabilitation  ONSET DATE: 07/30/2023  SUBJECTIVE:  SUBJECTIVE STATEMENT: 09/07/2023 Patient reports that her recent surgery went well. She is is only able to use R hand for the most part. She has not been taking compression sock on/off or boot, d/t this limitation.    Patient presents to our clinic today approx 4 weeks s/p LEFT endoscopic gastroc recession and plantar fasciotomy. She has history of same procedure on right in 4/4 with subsequent PT at our location. She states that she has not begun working on any exercises prior to today's evaluation. She is WBAT in boot and primarily using her knee scooter at home.    PERTINENT HISTORY: Relevant PMHx includes DM, Fibromyalgia, HTN, Depression, Obesity  Patient scheduled for L Carpal  Tunnel Release procedure on 09/01/2023.  PAIN:  Are you having pain?  Yes: NPRS scale: 8/10 Pain location: L posterior ankle  Pain description: soreness Aggravating factors: worse  Relieving factors: rest  PRECAUTIONS: None  RED FLAGS: None   WEIGHT BEARING RESTRICTIONS: Yes WBAT with boot   FALLS:  Has patient fallen in last 6 months? 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 To be able to perform household cleaning.   NEXT MD VISIT: 09/09/2023  OBJECTIVE:  Note: Objective measures were completed at Evaluation unless otherwise noted.   PATIENT SURVEYS:  LEFS: 52/80  COGNITION: Overall cognitive status: Within functional limits for tasks assessed     SENSATION: Not tested  LOWER EXTREMITY ROM:  Active ROM Right eval Left eval  Hip flexion    Hip extension    Hip abduction    Hip adduction    Hip internal rotation    Hip external rotation    Knee flexion    Knee extension    Ankle dorsiflexion -12   Ankle plantarflexion 35   Ankle inversion 22   Ankle eversion 5    (Blank rows = not tested)  LOWER EXTREMITY MMT:  MMT Right eval Left eval  Hip flexion    Hip extension    Hip abduction    Hip adduction    Hip internal rotation    Hip external rotation    Knee flexion    Knee extension    Ankle dorsiflexion    Ankle plantarflexion    Ankle inversion    Ankle eversion     (Blank rows = not tested)   FUNCTIONAL TESTS:  2 minute walk test: deferred  GAIT: Distance walked: 50 ft Assistive device utilized: Knee Scooter Level of assistance: Modified independence Comments: Deferred formal gait assessment d/t patient has been ambulating with  knee scooter since surgery.  TREATMENT DATE:   [facility] Adult PT Treatment:                                                DATE: 09/07/2023  Therex: nu-step L2 66m while taking subjective and planning session with patient Towel scrunches 2 x 60 sec  Dynadisc 30 sec DF/PF, 30 sec  Dynadisc 30 sec Ankle Circles  Supine ankle ABC's x 1 round    Self-Care: Discussion strategy and equipment for don/doff compression socks  Patient education regarding conservative pain management and inflammation strategies, including diet changes. Encouraged patient to follow with appropriate providers.  Encouraged patient to don/doff compression socks and boot as able. Discussed possible supportive equipment for this     HOME EXERCISE PROGRAM: Access Code: [email or web address] URL: [email or web address] Date: 08/25/2023 Prepared by: Marko Molt  Exercises - Supine Ankle Pumps  - 2 x daily - 7 x weekly - 2 sets - 10 reps - Ankle Inversion Eversion Towel Slide  - 2 x daily - 7 x weekly - 2 sets - 10 reps - Seated Ankle Circles  - 2 x daily - 7 x weekly - 2 sets - 10 reps - Seated Toe Curl  - 2 x daily - 7 x weekly - 2 sets - 10 reps - Toe Spreading  - 2 x daily - 7 x weekly - 2 sets - 10 reps  ASSESSMENT:  CLINICAL IMPRESSION:  09/07/2023: Felesia  had good tolerance of today's treatment session, which focused on ankle mobility. We will continue to progress per POC as tolerated, in order to reach established rehab goals.     EVAL: Sadhana  is a 38 y.o. female who was seen today for physical therapy evaluation and treatment for L ankle pain with mobility deficts, 4 weeks s/p endoscopic gastroc recession and plantar fasciotomy. She is demonstrating decreased ankle ROM and has altered gait mechanics. She requires skilled PT services at this time to address relevant deficits,  including ankle stability, balance and gait mechanics, and improve overall function.     OBJECTIVE IMPAIRMENTS: Abnormal gait, decreased activity tolerance, decreased balance, decreased ROM, decreased strength, increased edema, and pain.   ACTIVITY LIMITATIONS: carrying, lifting, standing, squatting, stairs, and transfers  PARTICIPATION LIMITATIONS: meal prep, cleaning, shopping, community activity, and occupation  PERSONAL FACTORS: Past/current experiences and 3+ comorbidities: DM, Fibromyalgia, HTN, Depression, Obesity are also affecting patient's functional outcome.   REHAB POTENTIAL: Fair    CLINICAL DECISION MAKING: Evolving/moderate complexity  EVALUATION COMPLEXITY: Moderate   GOALS: Goals reviewed with patient? YES  SHORT TERM GOALS: Target date: 09/22/2023   Patient will be independent with initial home program at least 3 days/week.  Baseline: provided at eval Goal Status: INITIAL   2.  Patient will demonstrate improved L ankle DF AROM by at least 5 degrees.  Baseline: lacking 12 degrees of DF  Goal Status: INITIAL   3.  Patient will report ambulating with BIL crutches or LRAD >75% of the day.  Baseline: Utilizing knee scooter for household and community ambulation Goal status: INITIAL   LONG TERM GOALS: Target date: 10/02/2023   Patient will report improved overall functional ability with LEFS score of 65 or greater.  Baseline: 52/80 Goal Status: INITIAL    2.  Patient will demonstrate improved L ankle DF AROM to at least 0-5 degrees.  Baseline: lacking 12 degrees of DF Goal status:  INITIAL  3.  Patient will demonstrate ability to tolerate independent ambulation for at least 10 minutes.  Baseline: see gait assessment  Goal status: INITIAL  4.  Patient will demonstrate ability to ascend and descend stair height of at least 6 x 15 with minimal-to-no pain Baseline: severe pain/difficulty  Goal status: INITIAL      PLAN:  PT FREQUENCY:  1-2x/week  PT DURATION: 8 weeks  PLANNED INTERVENTIONS: 97164- PT Re-evaluation, 97750- Physical Performance Testing, 97110-Therapeutic exercises, 97530- Therapeutic activity, 97112- Neuromuscular re-education, 97535- Self Care, 02859- Manual therapy, [id number]- Gait training, [phone]- Aquatic Therapy, [phone]- Electrical stimulation (unattended), 97016- Vasopneumatic device, 97033- Ionotophoresis 4mg /ml Dexamethasone , Patient/Family education, Balance training, Stair training, Cryotherapy, and Moist heat  [employer] Authorization   Choose one: Rehabilitative  Standardized Assessment or Functional Outcome Tool: See Pain Assessment and LEFS  Score or Percent Disability: 52/80  Body Parts Treated (Select each separately):  Ankle. Overall deficits/functional limitations for body part selected: moderate   If treatment provided at initial evaluation, no treatment charged due to lack of authorization.    PLAN FOR NEXT SESSION: progress as appropriate for post op status, including ankle AROM, strengthening, gait training, manual therapy and further assessment as indicated.    Marko Molt, PT, DPT  09/07/2023 10:11 AM

## 2023-09-08 ENCOUNTER — Other Ambulatory Visit (HOSPITAL_COMMUNITY): Payer: Self-pay

## 2023-09-09 ENCOUNTER — Ambulatory Visit: Payer: Medicaid Other | Admitting: Podiatry

## 2023-09-09 ENCOUNTER — Ambulatory Visit

## 2023-09-09 VITALS — Ht 59.0 in | Wt 193.0 lb

## 2023-09-09 DIAGNOSIS — M79672 Pain in left foot: Secondary | ICD-10-CM

## 2023-09-09 DIAGNOSIS — R262 Difficulty in walking, not elsewhere classified: Secondary | ICD-10-CM

## 2023-09-09 DIAGNOSIS — R600 Localized edema: Secondary | ICD-10-CM

## 2023-09-09 DIAGNOSIS — M62462 Contracture of muscle, left lower leg: Secondary | ICD-10-CM

## 2023-09-09 DIAGNOSIS — M62461 Contracture of muscle, right lower leg: Secondary | ICD-10-CM

## 2023-09-09 DIAGNOSIS — M722 Plantar fascial fibromatosis: Secondary | ICD-10-CM

## 2023-09-09 DIAGNOSIS — M25672 Stiffness of left ankle, not elsewhere classified: Secondary | ICD-10-CM

## 2023-09-09 NOTE — Therapy (Signed)
 OUTPATIENT PHYSICAL THERAPY NOTE   Patient Name: Sherri [name]  KARRIE FLUELLEN MRN: [id number] DOB:[birth year], 38 y.o., female Today's Date: 09/09/2023   PT End of Session - 09/09/23 1116     Visit Number 5    Number of Visits 16    Date for PT Re-Evaluation 10/20/23    Authorization Type [facility]    Authorization Time Period Approved 8 PT visits from 07/02/23-08/31/23    Authorization - Visit Number 5    Authorization - Number of Visits 8    PT Start Time 1120    PT Stop Time 1200    PT Time Calculation (min) 40 min    Activity Tolerance Patient tolerated treatment well    Behavior During Therapy WFL for tasks assessed/performed             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    Urinary tract infection    Uterine fibroid    Past Surgical History:  Procedure Laterality Date   APPENDECTOMY     CESAREAN SECTION N/A 06/05/2014   Procedure: CESAREAN SECTION;  Surgeon: Lang JINNY Peel, DO;  Location: [facility];  Service: Obstetrics;  Laterality: N/A;   FOOT SURGERY     LAPAROSCOPIC APPENDECTOMY N/A 07/10/2023   Procedure: APPENDECTOMY, LAPAROSCOPIC;  Surgeon: Ann Fine, MD;  Location: [facility];  Service: General;  Laterality: N/A;   WISDOM TOOTH EXTRACTION     Patient Active Problem List   Diagnosis Date Noted   Acute appendicitis 07/10/2023   Initiation of Depo Provera  06/29/2023   Numbness and tingling of hand 05/13/2023   Moderate episode of recurrent major depressive disorder (HCC) 07/16/2022   Pre-diabetes 07/23/2021   Anxiety and depression 06/24/2021   Fibromyalgia 06/24/2021   Chronic sinusitis 12/26/2020   Sinus pressure 12/26/2020   Prediabetes 07/05/2020   Gastroesophageal reflux disease 04/26/2020   Perianal rash 03/26/2020   Chronic nausea 03/26/2020   Chronic  constipation 03/09/2019   Pelvic pain in female 03/09/2019   Hypertension    Genital herpes    Uterine fibroid 12/25/2013   Obesity 12/25/2013   Bacterial vaginitis 04/27/2012    PCP: Doreene Maude JINNY, MD   REFERRING PROVIDER: Silva Juliene SAUNDERS, DPM  REFERRING DIAG: Bilateral plantar fasciitis [M72.2], 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 Patient is status post endoscopic gastroc recession and plantar fasciotomy bilateral, would like to include ROM, strengthening, stability, and tissue manipulation as needed and conditioning.  Weightbearing as tolerated, start therapy after June 23. Modalities PRN at therapist's discretion.    THERAPY DIAG:  Pain in Left Foot Difficulty in walking, not elsewhere classified Stiffness of Left ankle, not elsewhere classified Localized edema  Rationale for Evaluation and Treatment: Rehabilitation  ONSET DATE: 07/30/2023  SUBJECTIVE:  SUBJECTIVE STATEMENT: 09/09/2023 Patient reports that she saw Dr. Silva today, and she is able to stop wearing the boot now. She feels good about this.    EVAL: Patient presents to our clinic today approx 4 weeks s/p LEFT endoscopic gastroc recession and plantar fasciotomy. She has history of same procedure on right in 4/4 with subsequent PT at our location. She states that she has not begun working on any exercises prior to today's evaluation. She is WBAT in boot and primarily using her knee scooter at home.    PERTINENT HISTORY: Relevant PMHx includes DM, Fibromyalgia, HTN, Depression, Obesity  Patient scheduled for L Carpal Tunnel Release procedure on 09/01/2023.  PAIN:  Are  you having pain?  Yes: NPRS scale: 8/10 Pain location: L posterior ankle  Pain description: soreness Aggravating factors: worse  Relieving factors: rest  PRECAUTIONS: None  RED FLAGS: None   WEIGHT BEARING RESTRICTIONS: Yes WBAT with boot   FALLS:  Has patient fallen in last 6 months? 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 To be able to perform household cleaning.   NEXT MD VISIT: 09/09/2023  OBJECTIVE:  Note: Objective measures were completed at Evaluation unless otherwise noted.   PATIENT SURVEYS:  LEFS: 52/80  COGNITION: Overall cognitive status: Within functional limits for tasks assessed     SENSATION: Not tested  LOWER EXTREMITY ROM:  Active ROM Right eval Left eval  Hip flexion    Hip extension    Hip abduction    Hip adduction    Hip internal rotation    Hip external rotation    Knee flexion    Knee extension    Ankle dorsiflexion -12   Ankle plantarflexion 35   Ankle inversion 22   Ankle eversion 5    (Blank rows = not tested)  LOWER EXTREMITY MMT:  MMT Right eval Left eval  Hip flexion    Hip extension    Hip abduction    Hip adduction    Hip internal rotation    Hip external rotation    Knee flexion    Knee extension    Ankle dorsiflexion    Ankle plantarflexion    Ankle inversion    Ankle eversion     (Blank rows = not tested)   FUNCTIONAL TESTS:  2 minute walk test: deferred  GAIT: Distance walked: 50 ft Assistive device utilized: Knee Scooter Level of assistance: Modified independence Comments: Deferred formal gait assessment d/t patient has been ambulating with knee scooter since surgery.  TREATMENT DATE:   [facility] Adult PT Treatment:                                                DATE: 09/07/2023  Therex: nu-step L2 44m while taking subjective and planning session with patient Seated calf stretch 2x 30 sec  Towel scrunches 2 x 30 sec  Dynadisc 30 sec DF/PF, 30 sec  Dynadisc 30 sec Ankle Circles  Supine ankle ABC's x 1 round    Self-Care: Patient education regarding transitioning to normal/supportive footwear. Discouraged use of slides/flip flops.     HOME EXERCISE PROGRAM: Access Code: [email or web address] URL: [email or web address] Date: 08/25/2023 Prepared by: Marko Molt  Exercises - Supine Ankle Pumps  - 2 x daily - 7 x weekly - 2 sets - 10 reps - Ankle Inversion Eversion Towel Slide  - 2 x daily - 7 x weekly - 2 sets - 10 reps - Seated Ankle Circles  - 2 x daily - 7 x weekly - 2 sets - 10 reps - Seated Toe Curl  - 2 x daily - 7 x weekly - 2 sets - 10 reps - Toe Spreading  - 2 x daily - 7 x weekly - 2 sets - 10 reps  ASSESSMENT:  CLINICAL IMPRESSION:  09/09/2023: Bridgette  had good tolerance of today's treatment session, which focused on ankle mobility, and intrinsic mm endurance. Time spent providing patient education today, including transitioning to footwear over the weekend. Plan is to progress strengthening activities as appropriate for post op status at next visit. We will continue to progress per POC as tolerated, in order to reach established rehab goals.     EVAL: Kella  is a 38 y.o. female who was seen today for physical therapy evaluation and treatment for L ankle pain with mobility deficts, 4 weeks s/p endoscopic gastroc recession and plantar fasciotomy. She is demonstrating decreased ankle ROM and has altered gait mechanics. She requires skilled PT services at this time to address relevant deficits, including ankle stability, balance and gait  mechanics, and improve overall function.     OBJECTIVE IMPAIRMENTS: Abnormal gait, decreased activity tolerance, decreased balance, decreased ROM, decreased strength, increased edema, and pain.   ACTIVITY LIMITATIONS: carrying, lifting, standing, squatting, stairs, and transfers  PARTICIPATION LIMITATIONS: meal prep, cleaning, shopping, community activity, and occupation  PERSONAL FACTORS: Past/current experiences and 3+ comorbidities: DM, Fibromyalgia, HTN, Depression, Obesity are also affecting patient's functional outcome.   REHAB POTENTIAL: Fair    CLINICAL DECISION MAKING: Evolving/moderate complexity  EVALUATION COMPLEXITY: Moderate   GOALS: Goals reviewed with patient? YES  SHORT TERM GOALS: Target date: 09/22/2023   Patient will be independent with initial home program at least 3 days/week.  Baseline: provided at eval Goal Status: INITIAL   2.  Patient will demonstrate improved L ankle DF AROM by at least 5 degrees.  Baseline: lacking 12 degrees of DF  Goal Status: INITIAL   3.  Patient will report ambulating with BIL crutches or LRAD >75% of the day.  Baseline: Utilizing knee scooter for household and community ambulation Goal status: INITIAL   LONG TERM GOALS: Target date: 10/02/2023   Patient will report improved overall functional ability with LEFS score of 65 or greater.  Baseline: 52/80 Goal Status: INITIAL    2.  Patient will demonstrate improved L ankle DF AROM to at least 0-5 degrees.  Baseline: lacking  12 degrees of DF Goal status: INITIAL  3.  Patient will demonstrate ability to tolerate independent ambulation for at least 10 minutes.  Baseline: see gait assessment  Goal status: INITIAL  4.  Patient will demonstrate ability to ascend and descend stair height of at least 6 x 15 with minimal-to-no pain Baseline: severe pain/difficulty  Goal status: INITIAL      PLAN:  PT FREQUENCY: 1-2x/week  PT DURATION: 8 weeks  PLANNED  INTERVENTIONS: 97164- PT Re-evaluation, 97750- Physical Performance Testing, 97110-Therapeutic exercises, 97530- Therapeutic activity, 97112- Neuromuscular re-education, 97535- Self Care, 02859- Manual therapy, [id number]- Gait training, [phone]- Aquatic Therapy, [phone]- Electrical stimulation (unattended), 97016- Vasopneumatic device, [id number]- Ionotophoresis 4mg /ml Dexamethasone , Patient/Family education, Balance training, Stair training, Cryotherapy, and Moist heat  [employer] Authorization   Choose one: Rehabilitative  Standardized Assessment or Functional Outcome Tool: See Pain Assessment and LEFS  Score or Percent Disability: 52/80  Body Parts Treated (Select each separately):  Ankle. Overall deficits/functional limitations for body part selected: moderate   If treatment provided at initial evaluation, no treatment charged due to lack of authorization.    PLAN FOR NEXT SESSION: progress as appropriate for post op status, including ankle AROM, strengthening, gait training, manual therapy and further assessment as indicated.    Marko Molt, PT, DPT  09/09/2023 1:39 PM

## 2023-09-10 ENCOUNTER — Other Ambulatory Visit (HOSPITAL_COMMUNITY): Payer: Self-pay

## 2023-09-10 DIAGNOSIS — M2010 Hallux valgus (acquired), unspecified foot: Secondary | ICD-10-CM | POA: Insufficient documentation

## 2023-09-12 NOTE — Progress Notes (Signed)
  Subjective:  Patient ID: Sherri  DELENA [name], female    DOB: [birth year],  MRN: [id number]  Chief Complaint  Patient presents with   Post-op Follow-up    RM 6 POV #3, DOS 07/30/23, LEFT FOOT PLANTAR FASCIA RELEASE AND CALF MUSCLE LENGTHENING. Patient states some continued swelling and intermittent pain. Patient is currently using cold therapy, compression sock and walking boot.     38 y.o. female returns for post-op check.  Still notes some numbness and soreness on the left side of the foot  Review of Systems: Negative except as noted in the HPI. Denies N/V/F/Ch.   Objective:  There were no vitals filed for this visit. Body mass index is 38.98 kg/m. Constitutional Well developed. Well nourished.  Vascular Foot warm and well perfused. Capillary refill normal to all digits.  Calf is soft and supple, no posterior calf or knee pain, negative Homans' sign  Neurologic Normal speech. Oriented to person, place, and time. Epicritic sensation to light touch grossly present bilaterally.  Dermatologic Skin healing well without signs of infection. Skin edges well coapted without signs of infection.  Orthopedic: Tenderness to palpation noted about the surgical site.   Assessment:   1. Bilateral plantar fasciitis   2. Gastrocnemius equinus of right lower extremity   3. Gastrocnemius equinus of left lower extremity      Plan:  Patient was evaluated and treated and all questions answered.  S/p foot surgery left - Still fairly tender.  I recommend she continue using contrast baths and physical therapy.  Her right side did eventually turn a corner and this will take some time to.  Advised to stick with PT and emphasize importance of home exercise plan.  Follow-up in 2 months to reevaluate   No follow-ups on file.

## 2023-09-14 ENCOUNTER — Ambulatory Visit

## 2023-09-14 ENCOUNTER — Other Ambulatory Visit (HOSPITAL_COMMUNITY): Payer: Self-pay

## 2023-09-14 DIAGNOSIS — M79672 Pain in left foot: Secondary | ICD-10-CM | POA: Diagnosis not present

## 2023-09-14 DIAGNOSIS — M25672 Stiffness of left ankle, not elsewhere classified: Secondary | ICD-10-CM

## 2023-09-14 DIAGNOSIS — R262 Difficulty in walking, not elsewhere classified: Secondary | ICD-10-CM

## 2023-09-14 DIAGNOSIS — R600 Localized edema: Secondary | ICD-10-CM

## 2023-09-14 NOTE — Therapy (Signed)
 OUTPATIENT PHYSICAL THERAPY NOTE   Patient Name: Sherri [name] MRN: [id number] DOB:01/02/1986, 38 y.o., female Today's Date: 09/14/2023   PT End of Session - 09/14/23 1222     Visit Number 6    Number of Visits 16    Date for PT Re-Evaluation 10/20/23    Authorization Type MCD [facility]    Authorization Time Period Approved 8 PT visits from 07/02/23-08/31/23    Authorization - Visit Number 6    Authorization - Number of Visits 8    PT Start Time 1217    PT Stop Time 1302    PT Time Calculation (min) 45 min    Activity Tolerance Patient tolerated treatment well    Behavior During Therapy WFL for tasks assessed/performed              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    Urinary tract infection    Uterine fibroid    Past Surgical History:  Procedure Laterality Date   APPENDECTOMY     CESAREAN SECTION N/A 06/05/2014   Procedure: CESAREAN SECTION;  Surgeon: Lang JINNY Peel, DO;  Location: [facility];  Service: Obstetrics;  Laterality: N/A;   FOOT SURGERY     LAPAROSCOPIC APPENDECTOMY N/A 07/10/2023   Procedure: APPENDECTOMY, LAPAROSCOPIC;  Surgeon: Ann Fine, MD;  Location: [facility];  Service: General;  Laterality: N/A;   WISDOM TOOTH EXTRACTION     Patient Active Problem List   Diagnosis Date Noted   Acute appendicitis 07/10/2023   Initiation of Depo Provera  06/29/2023   Numbness and tingling of hand 05/13/2023   Moderate episode of recurrent major depressive disorder (HCC) 07/16/2022   Pre-diabetes 07/23/2021   Anxiety and depression 06/24/2021   Fibromyalgia 06/24/2021   Chronic sinusitis 12/26/2020   Sinus pressure 12/26/2020   Prediabetes 07/05/2020   Gastroesophageal reflux disease 04/26/2020   Perianal rash 03/26/2020   Chronic nausea 03/26/2020   Chronic  constipation 03/09/2019   Pelvic pain in female 03/09/2019   Hypertension    Genital herpes    Uterine fibroid 12/25/2013   Obesity 12/25/2013   Bacterial vaginitis 04/27/2012    PCP: Doreene Maude JINNY, MD   REFERRING PROVIDER: Silva Juliene SAUNDERS, DPM  REFERRING DIAG: Bilateral plantar fasciitis [M72.2], 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 Patient is status post endoscopic gastroc recession and plantar fasciotomy bilateral, would like to include ROM, strengthening, stability, and tissue manipulation as needed and conditioning.  Weightbearing as tolerated, start therapy after June 23. Modalities PRN at therapist's discretion.    THERAPY DIAG:  Pain in Left Foot Difficulty in walking, not elsewhere classified Stiffness of Left ankle, not elsewhere classified Localized edema  Rationale for Evaluation and Treatment: Rehabilitation  ONSET DATE: 07/30/2023  SUBJECTIVE:  SUBJECTIVE STATEMENT: 09/14/2023 Patient stopped wearing the boot and transitions to slides. She states that she has tried on shoes, but they all were painful. Upon inquiry, she states that her pain is just as sore in all shoes.    EVAL: Patient presents to our clinic today approx 4 weeks s/p LEFT endoscopic gastroc recession and plantar fasciotomy. She has history of same procedure on right in 4/4 with subsequent PT at our location. She states that she has not begun working on any exercises prior to today's evaluation. She is WBAT in boot and primarily using her knee scooter at home.    PERTINENT HISTORY: Relevant PMHx includes DM, Fibromyalgia, HTN, Depression, Obesity  Patient  scheduled for L Carpal Tunnel Release procedure on 09/01/2023.  PAIN:  Are you having pain?  Yes: NPRS scale: 8/10 Pain location: L posterior ankle  Pain description: soreness Aggravating factors: worse  Relieving factors: rest  PRECAUTIONS: None  RED FLAGS: None   WEIGHT BEARING RESTRICTIONS: Yes WBAT with boot   FALLS:  Has patient fallen in last 6 months? 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 To be able to perform household cleaning.   NEXT MD VISIT: 09/09/2023  OBJECTIVE:  Note: Objective measures were completed at Evaluation unless otherwise noted.   PATIENT SURVEYS:  LEFS: 52/80  COGNITION: Overall cognitive status: Within functional limits for tasks assessed     SENSATION: Not tested  LOWER EXTREMITY ROM:  Active ROM Right eval Left eval  Hip flexion    Hip extension    Hip abduction    Hip adduction    Hip internal rotation    Hip external rotation    Knee flexion    Knee extension    Ankle dorsiflexion -12   Ankle plantarflexion 35   Ankle inversion 22   Ankle eversion 5    (Blank rows = not tested)  LOWER EXTREMITY MMT:  MMT Right eval Left eval  Hip flexion    Hip extension    Hip abduction    Hip adduction    Hip internal rotation    Hip external rotation    Knee flexion    Knee extension    Ankle dorsiflexion    Ankle plantarflexion    Ankle inversion    Ankle eversion     (Blank rows = not tested)   FUNCTIONAL TESTS:  2 minute walk test: deferred  GAIT: Distance walked: 50 ft Assistive device utilized: Knee Scooter Level of assistance: Modified independence Comments: Deferred formal gait assessment d/t patient  has been ambulating with knee scooter since surgery.  TREATMENT DATE:   [facility] Adult PT Treatment:                                                DATE: 09/14/2023  Therex: nu-step L2 40m while taking subjective and planning session with patient Seated calf stretch 2x 30 sec  Standing Towel scrunches 2 x 30 sec  1/2 foam step overs at parallel bars Seated ankle pumps Updated and reviewed HEP    Self-Care: Patient education regarding transitioning to normal/supportive footwear. Discouraged use of slides/flip flops.   Vasopneumatic Compression:  L ankle - 38 degrees, 10 minutes, low compression    HOME EXERCISE PROGRAM: Access Code: [email or web address] URL: [email or web address] Date: 09/14/2023 Prepared by: Marko Molt  Exercises - Supine Ankle Pumps  - 2 x daily - 7 x weekly - 2 sets - 10 reps - Ankle Inversion Eversion Towel Slide  - 2 x daily - 7 x weekly - 2 sets - 10 reps - Seated Toe Towel Scrunches  - 1 x daily - 7 x weekly - 2-3 sets - 10 reps - 3 sec hold - Seated Ankle Circles  - 2 x daily - 7 x weekly - 2 sets - 10 reps - Long Sitting Calf Stretch with Strap  - 1 x daily - 7 x weekly - 3 sets - 15-20 sec hold  ASSESSMENT:  CLINICAL IMPRESSION:  09/14/2023: Sherri [name]  had good tolerance of today's treatment session, which focused on ankle mobility, and intrinsic mm endurance. Time spent providing patient education today, including strongly encouraging use of supportive shoes. HEP updated. We will continue to progress per POC as tolerated, in order to reach established rehab goals.     EVAL: Sherri [name]  is a 38 y.o. female who was seen today for physical therapy evaluation and treatment for L ankle pain with mobility deficts, 4 weeks s/p endoscopic gastroc recession and plantar fasciotomy. She is demonstrating decreased ankle ROM and has altered  gait mechanics. She requires skilled PT services at this time to address relevant deficits, including ankle stability, balance and gait mechanics, and improve overall function.     OBJECTIVE IMPAIRMENTS: Abnormal gait, decreased activity tolerance, decreased balance, decreased ROM, decreased strength, increased edema, and pain.   ACTIVITY LIMITATIONS: carrying, lifting, standing, squatting, stairs, and transfers  PARTICIPATION LIMITATIONS: meal prep, cleaning, shopping, community activity, and occupation  PERSONAL FACTORS: Past/current experiences and 3+ comorbidities: DM, Fibromyalgia, HTN, Depression, Obesity are also affecting patient's functional outcome.   REHAB POTENTIAL: Fair    CLINICAL DECISION MAKING: Evolving/moderate complexity  EVALUATION COMPLEXITY: Moderate   GOALS: Goals reviewed with patient? YES  SHORT TERM GOALS: Target date: 09/22/2023   Patient will be independent with initial home program at least 3 days/week.  Baseline: provided at eval Goal Status: INITIAL   2.  Patient will demonstrate improved L ankle DF AROM by at least 5 degrees.  Baseline: lacking 12 degrees of DF  Goal Status: INITIAL   3.  Patient will report ambulating with BIL crutches or LRAD >75% of the day.  Baseline: Utilizing knee scooter for household and community ambulation Goal status: INITIAL   LONG TERM GOALS: Target date: 10/02/2023   Patient will report improved overall functional ability with LEFS score of 65 or greater.  Baseline: 52/80 Goal Status: INITIAL    2.  Patient will demonstrate improved L ankle DF AROM to at  least 0-5 degrees.  Baseline: lacking 12 degrees of DF Goal status: INITIAL  3.  Patient will demonstrate ability to tolerate independent ambulation for at least 10 minutes.  Baseline: see gait assessment  Goal status: INITIAL  4.  Patient will demonstrate ability to ascend and descend stair height of at least 6 x 15 with minimal-to-no pain Baseline:  severe pain/difficulty  Goal status: INITIAL      PLAN:  PT FREQUENCY: 1-2x/week  PT DURATION: 8 weeks  PLANNED INTERVENTIONS: 97164- PT Re-evaluation, 97750- Physical Performance Testing, 97110-Therapeutic exercises, 97530- Therapeutic activity, 97112- Neuromuscular re-education, 97535- Self Care, 02859- Manual therapy, [id number]- Gait training, [phone]- Aquatic Therapy, [phone]- Electrical stimulation (unattended), 97016- Vasopneumatic device, 97033- Ionotophoresis 4mg /ml Dexamethasone , Patient/Family education, Balance training, Stair training, Cryotherapy, and Moist heat   [employer] Authorization   Choose one: Rehabilitative  Standardized Assessment or Functional Outcome Tool: See Pain Assessment and LEFS  Score or Percent Disability: 52/80  Body Parts Treated (Select each separately):  Ankle. Overall deficits/functional limitations for body part selected: moderate   If treatment provided at initial evaluation, no treatment charged due to lack of authorization.    PLAN FOR NEXT SESSION: progress as appropriate for post op status, including ankle AROM, strengthening, gait training, manual therapy and further assessment as indicated.    Marko Molt, PT, DPT  09/14/2023 5:43 PM

## 2023-09-15 ENCOUNTER — Other Ambulatory Visit (HOSPITAL_COMMUNITY): Payer: Self-pay

## 2023-09-15 ENCOUNTER — Other Ambulatory Visit: Payer: Self-pay

## 2023-09-15 MED ORDER — PANTOPRAZOLE SODIUM 40 MG PO TBEC
40.0000 mg | DELAYED_RELEASE_TABLET | Freq: Two times a day (BID) | ORAL | 0 refills | Status: DC
  Filled 2023-11-22: qty 180, 90d supply, fill #0

## 2023-09-15 MED ORDER — HYOSCYAMINE SULFATE ER 0.375 MG PO TB12
0.3750 mg | ORAL_TABLET | Freq: Two times a day (BID) | ORAL | 3 refills | Status: DC
  Filled 2023-09-15 – 2023-09-27 (×3): qty 60, 30d supply, fill #0

## 2023-09-16 ENCOUNTER — Encounter

## 2023-09-16 ENCOUNTER — Other Ambulatory Visit: Payer: Self-pay

## 2023-09-16 ENCOUNTER — Ambulatory Visit

## 2023-09-16 DIAGNOSIS — M79672 Pain in left foot: Secondary | ICD-10-CM

## 2023-09-16 DIAGNOSIS — R600 Localized edema: Secondary | ICD-10-CM

## 2023-09-16 DIAGNOSIS — G5603 Carpal tunnel syndrome, bilateral upper limbs: Secondary | ICD-10-CM | POA: Insufficient documentation

## 2023-09-16 DIAGNOSIS — M25672 Stiffness of left ankle, not elsewhere classified: Secondary | ICD-10-CM

## 2023-09-16 DIAGNOSIS — R262 Difficulty in walking, not elsewhere classified: Secondary | ICD-10-CM

## 2023-09-16 NOTE — H&P (View-Only) (Signed)
 Plastic, Reconstructive & Hand Surgery Postoperative Follow-up   Dx:  Carpal Tunnel Syndrome Surgery: Left CTR DOS: 09/01/23  Patient doing well, denies fever, redness, or progressively worsening pain  Past Medical History:  Medical History[1]  Problem List Problem List[2]  Past Surgical History: Surgical History[3]  Social History: Social History   Tobacco Use  . Smoking status: Never    Passive exposure: Never  . Smokeless tobacco: Never  Substance Use Topics  . Alcohol use: Not Currently    Family History: family history is not on file.  Allergies: Allergies[4]  Medications: has a current medication list which includes the following prescription(s): acetaminophen , amitriptyline , amlodipine, azelastine , azelastine -fluticasone , blood pressure monitor, cholecalciferol, cyclobenzaprine , epinephrine , ergocalciferol , famotidine , fluticasone  propionate, furosemide, gabapentin , gemfibrozil, guaifenesin , hydrocodone -acetaminophen , hydroxyzine , hyoscyamine , ibuprofen , levocetirizine, lidocaine , lisinopril, methocarbamol , montelukast , naproxen , ondansetron , ondansetron , pantoprazole , rizatriptan , tramadol , tranexamic acid , and valacyclovir .  Review of Systems: A 14 point review of systems was obtained and negative unless otherwise stated in the HPI  Physical exam: There were no vitals filed for this visit.  Gen: well appearing female in no acute distress Psych: alert, normal mood/affect Neuro: follows commands, CN VII function symmetric Eyes: pupils equal, round ENT:  mucous membranes moist and pink CV: no clubbing or cyanosis Pulm: breathing comfortably on room air, no distress Skin: warm, no visible cyanosis Extremity: Incision C/D/I, no surrounding erythema or signs of infection Thenar function intact Sensation: LT sensation present M/R/U Vascular: Brisk capillary refill   Assessment/Plan: No diagnosis found. 38 y.o. female with bilateral CTS s/p left  CTR -Increase activity as tolerated, start scar massage for pillar pain -She is ready to proceed with right CTR, she will meet with our scheduler to choose a date and time  Sherri JULIANNA Hock, MD  Plastic & Reconstructive Surgery Hand & Upper Extremity Surgery [facility] 09/16/2023 9:57 AM       [1] Past Medical History: Diagnosis Date  . Diabetes mellitus    (CMD) 07/05/2020  . GERD (gastroesophageal reflux disease) 04/26/2020  . Hypertension   . Irritable bowel syndrome 04/27/2023  . Lactose intolerance 04/26/2020  [2] Patient Active Problem List Diagnosis  . Sinus pressure  . Chronic sinusitis  . OAB (overactive bladder)  [3] Past Surgical History: Procedure Laterality Date  . APPENDECTOMY  07/15/2023  . CARPAL TUNNEL RELEASE Left 09/01/2023   RELEASE CARPAL TUNNEL performed by Sherri JULIANNA Hock, MD at [facility] OR  [4] Allergies Allergen Reactions  . Nitrofurantoin  Nausea And Vomiting  . Amoxicillin -Pot Clavulanate     Pt states it makes her sick  . Cefdinir  Itching  . Metformin  Nausea And Vomiting

## 2023-09-16 NOTE — Therapy (Signed)
 OUTPATIENT PHYSICAL THERAPY NOTE   Patient Name: Sherri [name] MRN: [id number] DOB:[birth year], 38 y.o., female Today's Date: 09/16/2023   PT End of Session - 09/16/23 1147     Visit Number 7    Number of Visits 16    Date for PT Re-Evaluation 10/20/23    Authorization Type MCD [facility]    Authorization Time Period Approved 8 PT visits from 07/02/23-08/31/23    Authorization - Visit Number 7    Authorization - Number of Visits 8    PT Start Time 1145    PT Stop Time 1223    PT Time Calculation (min) 38 min    Activity Tolerance Patient tolerated treatment well    Behavior During Therapy WFL for tasks assessed/performed               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    Urinary tract infection    Uterine fibroid    Past Surgical History:  Procedure Laterality Date   APPENDECTOMY     CESAREAN SECTION N/A 06/05/2014   Procedure: CESAREAN SECTION;  Surgeon: Lang JINNY Peel, DO;  Location: [facility];  Service: Obstetrics;  Laterality: N/A;   FOOT SURGERY     LAPAROSCOPIC APPENDECTOMY N/A 07/10/2023   Procedure: APPENDECTOMY, LAPAROSCOPIC;  Surgeon: Ann Fine, MD;  Location: [facility];  Service: General;  Laterality: N/A;   WISDOM TOOTH EXTRACTION     Patient Active Problem List   Diagnosis Date Noted   Acute appendicitis 07/10/2023   Initiation of Depo Provera  06/29/2023   Numbness and tingling of hand 05/13/2023   Moderate episode of recurrent major depressive disorder (HCC) 07/16/2022   Pre-diabetes 07/23/2021   Anxiety and depression 06/24/2021   Fibromyalgia 06/24/2021   Chronic sinusitis 12/26/2020   Sinus pressure 12/26/2020   Prediabetes 07/05/2020   Gastroesophageal reflux disease 04/26/2020   Perianal rash 03/26/2020   Chronic nausea 03/26/2020   Chronic  constipation 03/09/2019   Pelvic pain in female 03/09/2019   Hypertension    Genital herpes    Uterine fibroid 12/25/2013   Obesity 12/25/2013   Bacterial vaginitis 04/27/2012    PCP: Doreene Maude JINNY, MD   REFERRING PROVIDER: Silva Juliene SAUNDERS, DPM  REFERRING DIAG: Bilateral plantar fasciitis [M72.2], 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 Patient is status post endoscopic gastroc recession and plantar fasciotomy bilateral, would like to include ROM, strengthening, stability, and tissue manipulation as needed and conditioning.  Weightbearing as tolerated, start therapy after June 23. Modalities PRN at therapist's discretion.    THERAPY DIAG:  Pain in Left Foot Difficulty in walking, not elsewhere classified Stiffness of Left ankle, not elsewhere classified Localized edema  Rationale for Evaluation and Treatment: Rehabilitation  ONSET DATE: 07/30/2023  SUBJECTIVE:  SUBJECTIVE STATEMENT: 09/16/2023 Patient reports to PT with 7/10 pain.    EVAL: Patient presents to our clinic today approx 4 weeks s/p LEFT endoscopic gastroc recession and plantar fasciotomy. She has history of same procedure on right in 4/4 with subsequent PT at our location. She states that she has not begun working on any exercises prior to today's evaluation. She is WBAT in boot and primarily using her knee scooter at home.    PERTINENT HISTORY: Relevant PMHx includes DM, Fibromyalgia, HTN, Depression, Obesity  Patient scheduled for L Carpal Tunnel Release procedure on 09/01/2023.  PAIN:  Are you having pain?  Yes: NPRS scale: 8/10 Pain location: L posterior ankle  Pain  description: soreness Aggravating factors: worse  Relieving factors: rest  PRECAUTIONS: None  RED FLAGS: None   WEIGHT BEARING RESTRICTIONS: Yes WBAT with boot   FALLS:  Has patient fallen in last 6 months? 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 To be able to perform household cleaning.   NEXT MD VISIT: 09/09/2023  OBJECTIVE:  Note: Objective measures were completed at Evaluation unless otherwise noted.   PATIENT SURVEYS:  LEFS: 52/80  COGNITION: Overall cognitive status: Within functional limits for tasks assessed     SENSATION: Not tested  LOWER EXTREMITY ROM:  Active ROM Right eval Left eval  Hip flexion    Hip extension    Hip abduction    Hip adduction    Hip internal rotation    Hip external rotation    Knee flexion    Knee extension    Ankle dorsiflexion -12   Ankle plantarflexion 35   Ankle inversion 22   Ankle eversion 5    (Blank rows = not tested)  LOWER EXTREMITY MMT:  MMT Right eval Left eval  Hip flexion    Hip extension    Hip abduction    Hip adduction    Hip internal rotation    Hip external rotation    Knee flexion    Knee extension    Ankle dorsiflexion    Ankle plantarflexion    Ankle inversion    Ankle eversion     (Blank rows = not tested)   FUNCTIONAL TESTS:  2 minute walk test: deferred  GAIT: Distance walked: 50 ft Assistive device utilized: Knee Scooter Level of assistance: Modified independence Comments: Deferred formal gait assessment d/t patient has been ambulating with knee scooter since surgery.                                                                                                                                  TREATMENT DATE:   [facility]  Adult PT Treatment:                                                DATE: 09/16/2023  Therex: Ankle PF/DF on dynadisc  Ankle circles on dynadisc  Resisted ankle 4-way with RTB x 2x10 Seated Toe Curling/Spreading 2 x 10  Seated calf stretch, 3 x 20 sec   Therapeutic Activity:  nu-step L3 3m while taking subjective and planning session with patient Hurdle step overs lateral, 2 x 5  Hurdle step overs forward, 2 x 5 each   Self-Care: Patient education regarding transitioning to normal/supportive footwear. Discouraged use of slides/flip flops.     HOME EXERCISE PROGRAM: Access Code: [email or web address] URL: [email or web address] Date: 09/14/2023 Prepared by: Marko Molt  Exercises - Supine Ankle Pumps  - 2 x daily - 7 x weekly - 2 sets - 10 reps - Ankle Inversion Eversion Towel Slide  - 2 x daily - 7 x weekly - 2 sets - 10 reps - Seated Toe Towel Scrunches  - 1 x daily - 7 x weekly - 2-3 sets - 10 reps - 3 sec hold - Seated Ankle Circles  - 2 x daily - 7 x weekly - 2 sets - 10 reps - Long Sitting Calf Stretch with Strap  - 1 x daily - 7 x weekly - 3 sets - 15-20 sec hold  ASSESSMENT:  CLINICAL IMPRESSION:  09/16/2023: Avani  had good tolerance of today's treatment session, which focused on ankle mobility, weight bearing tolerance, ankle proprioceptive activities. Patient had some difficulty with gait mechanics today. We will continue to progress per POC as tolerated, in order to reach established rehab goals.    EVAL: Lillyen  is a 38 y.o. female who was seen today for physical therapy evaluation and treatment for L ankle pain with mobility deficts, 4 weeks s/p endoscopic gastroc recession and plantar fasciotomy. She is demonstrating decreased ankle ROM and has altered gait mechanics. She requires skilled PT services at this time to address relevant deficits, including ankle stability, balance and gait mechanics, and improve overall  function.     OBJECTIVE IMPAIRMENTS: Abnormal gait, decreased activity tolerance, decreased balance, decreased ROM, decreased strength, increased edema, and pain.   ACTIVITY LIMITATIONS: carrying, lifting, standing, squatting, stairs, and transfers  PARTICIPATION LIMITATIONS: meal prep, cleaning, shopping, community activity, and occupation  PERSONAL FACTORS: Past/current experiences and 3+ comorbidities: DM, Fibromyalgia, HTN, Depression, Obesity are also affecting patient's functional outcome.   REHAB POTENTIAL: Fair    CLINICAL DECISION MAKING: Evolving/moderate complexity  EVALUATION COMPLEXITY: Moderate   GOALS: Goals reviewed with patient? YES  SHORT TERM GOALS: Target date: 09/22/2023   Patient will be independent with initial home program at least 3 days/week.  Baseline: provided at eval Goal Status: INITIAL   2.  Patient will demonstrate improved L ankle DF AROM by at least 5 degrees.  Baseline: lacking 12 degrees of DF  Goal Status: INITIAL   3.  Patient will report ambulating with BIL crutches or LRAD >75% of the day.  Baseline: Utilizing knee scooter for household and community ambulation Goal status: INITIAL   LONG TERM GOALS: Target date: 10/02/2023   Patient will report improved overall functional ability with LEFS score of 65 or greater.  Baseline: 52/80 Goal Status: INITIAL    2.  Patient will demonstrate improved L ankle DF AROM to at least 0-5  degrees.  Baseline: lacking 12 degrees of DF Goal status: INITIAL  3.  Patient will demonstrate ability to tolerate independent ambulation for at least 10 minutes.  Baseline: see gait assessment  Goal status: INITIAL  4.  Patient will demonstrate ability to ascend and descend stair height of at least 6 x 15 with minimal-to-no pain Baseline: severe pain/difficulty  Goal status: INITIAL      PLAN:  PT FREQUENCY: 1-2x/week  PT DURATION: 8 weeks  PLANNED INTERVENTIONS: 97164- PT Re-evaluation,  97750- Physical Performance Testing, 97110-Therapeutic exercises, 97530- Therapeutic activity, 97112- Neuromuscular re-education, 97535- Self Care, 02859- Manual therapy, [id number]- Gait training, [phone]- Aquatic Therapy, [phone]- Electrical stimulation (unattended), 97016- Vasopneumatic device, [id number]- Ionotophoresis 4mg /ml Dexamethasone , Patient/Family education, Balance training, Stair training, Cryotherapy, and Moist heat   [employer] Authorization   Choose one: Rehabilitative  Standardized Assessment or Functional Outcome Tool: See Pain Assessment and LEFS  Score or Percent Disability: 52/80  Body Parts Treated (Select each separately):  Ankle. Overall deficits/functional limitations for body part selected: moderate   If treatment provided at initial evaluation, no treatment charged due to lack of authorization.    PLAN FOR NEXT SESSION: progress as appropriate for post op status, including ankle AROM, strengthening, gait training, manual therapy and further assessment as indicated.    Marko Molt, PT, DPT  09/16/2023 12:35 PM

## 2023-09-17 ENCOUNTER — Other Ambulatory Visit (HOSPITAL_COMMUNITY): Payer: Self-pay

## 2023-09-21 ENCOUNTER — Ambulatory Visit

## 2023-09-21 DIAGNOSIS — M79672 Pain in left foot: Secondary | ICD-10-CM | POA: Diagnosis not present

## 2023-09-21 DIAGNOSIS — R262 Difficulty in walking, not elsewhere classified: Secondary | ICD-10-CM

## 2023-09-21 DIAGNOSIS — M25672 Stiffness of left ankle, not elsewhere classified: Secondary | ICD-10-CM

## 2023-09-21 NOTE — Therapy (Signed)
 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    Urinary tract infection    Uterine fibroid    Past Surgical History:  Procedure Laterality Date   APPENDECTOMY     CESAREAN SECTION N/A 06/05/2014   Procedure: CESAREAN SECTION;  Surgeon: Lang JINNY Peel, DO;  Location: [facility];  Service: Obstetrics;  Laterality: N/A;   FOOT SURGERY     LAPAROSCOPIC APPENDECTOMY N/A 07/10/2023   Procedure: APPENDECTOMY, LAPAROSCOPIC;  Surgeon: Ann Fine, MD;  Location: [facility];  Service: General;  Laterality: N/A;   WISDOM TOOTH EXTRACTION     Patient Active Problem List   Diagnosis Date Noted   Acute appendicitis 07/10/2023   Initiation of Depo Provera  06/29/2023   Numbness and tingling of hand 05/13/2023   Moderate episode of recurrent major depressive disorder (HCC) 07/16/2022   Pre-diabetes 07/23/2021   Anxiety and depression 06/24/2021   Fibromyalgia 06/24/2021   Chronic sinusitis 12/26/2020   Sinus pressure 12/26/2020   Prediabetes 07/05/2020   Gastroesophageal reflux disease 04/26/2020   Perianal rash 03/26/2020   Chronic nausea 03/26/2020   Chronic constipation 03/09/2019   Pelvic pain in female 03/09/2019   Hypertension    Genital herpes    Uterine fibroid 12/25/2013   Obesity 12/25/2013   Bacterial vaginitis 04/27/2012    PCP: Doreene Maude JINNY, MD   REFERRING PROVIDER: Silva Juliene SAUNDERS, DPM  REFERRING DIAG: Bilateral plantar fasciitis [M72.2], Gastrocnemius equinus of right lower extremity [M62.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 Patient is status post endoscopic gastroc recession and plantar fasciotomy bilateral, would like to include ROM, strengthening, stability, and tissue manipulation as needed and conditioning.  Weightbearing as tolerated, start therapy after June 23. Modalities PRN at therapist's discretion.    THERAPY DIAG:  Pain in Left Foot Difficulty in walking, not elsewhere classified Stiffness of Left ankle, not elsewhere classified Localized edema  Rationale for Evaluation and Treatment: Rehabilitation  ONSET DATE: 07/30/2023  SUBJECTIVE:                                                                                                                                                                                      SUBJECTIVE STATEMENT:  09/21/2023 Patient reports that she was unable to get shoes this weekend d/t feeling tired and nauseous.    EVAL: Patient presents to our clinic today approx 4 weeks s/p LEFT endoscopic gastroc recession and plantar fasciotomy. She has history of same procedure on right in 4/4 with subsequent PT at our location. She states that she has not begun working on any exercises prior to today's evaluation. She is WBAT in boot and primarily using her knee scooter at home.    PERTINENT HISTORY: Relevant PMHx includes DM, Fibromyalgia, HTN, Depression, Obesity  Patient scheduled for L Carpal Tunnel Release procedure on 09/01/2023.  PAIN:  Are you having pain?  Yes: NPRS scale: 8/10 Pain location: L posterior ankle  Pain description: soreness Aggravating factors: worse  Relieving factors: rest  PRECAUTIONS: None  RED FLAGS: None   WEIGHT BEARING RESTRICTIONS: Yes WBAT with boot   FALLS:  Has patient fallen in last 6 months? 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 To be able to perform household cleaning.   NEXT MD VISIT: 09/09/2023  OBJECTIVE:  Note: Objective measures were completed at Evaluation unless otherwise noted.   PATIENT SURVEYS:  LEFS: 52/80  COGNITION: Overall cognitive status: Within functional limits for tasks assessed     SENSATION: Not tested  LOWER EXTREMITY ROM:  Active ROM Right eval Left eval  Hip flexion    Hip extension    Hip abduction    Hip adduction    Hip internal rotation    Hip external rotation    Knee flexion    Knee extension    Ankle dorsiflexion -12   Ankle plantarflexion 35   Ankle inversion 22   Ankle eversion 5    (Blank rows = not tested)  LOWER EXTREMITY MMT:  MMT Right eval Left eval  Hip flexion    Hip extension    Hip abduction    Hip adduction    Hip internal rotation    Hip external rotation    Knee flexion    Knee extension    Ankle dorsiflexion    Ankle plantarflexion    Ankle inversion    Ankle eversion     (Blank rows = not tested)   FUNCTIONAL TESTS:  2 minute walk test: deferred  GAIT: Distance walked: 50 ft Assistive device utilized: Knee Scooter Level of assistance: Modified independence Comments: Deferred formal gait assessment d/t patient has been ambulating with knee scooter since surgery.  TREATMENT DATE:    [facility] Adult PT Treatment:                                                DATE: 09/21/2023  At Parallel bars (Therapeutic Activity)  Nustep L4 x 10 minutes Hurdle step overs 4 laps forward (4 hurdles + 1 airex pad in middle) + 2 laps laterally    Reports fatigue, requiring seated rest break   Therapeutic Exercise:  Seated heel raise 2 x 10 with 10lb KB  STS 2 x 5  SLS 3 x 30 sec     [facility]  Adult PT Treatment:                                                DATE: 09/16/2023  Therex: Ankle PF/DF on dynadisc  Ankle circles on dynadisc  Resisted ankle 4-way with RTB x 2x10 Seated Toe Curling/Spreading 2 x 10  Seated calf stretch, 3 x 20 sec   Therapeutic Activity:  nu-step L3 31m while taking subjective and planning session with patient Hurdle step overs lateral, 2 x 5  Hurdle step overs forward, 2 x 5 each   Self-Care: Patient education regarding transitioning to normal/supportive footwear. Discouraged use of slides/flip flops.     HOME EXERCISE PROGRAM: Access Code: [email or web address] URL: [email or web address] Date: 09/14/2023 Prepared by: Marko Molt  Exercises - Supine Ankle Pumps  - 2 x daily - 7 x weekly - 2 sets - 10 reps - Ankle Inversion Eversion Towel Slide  - 2 x daily - 7 x weekly - 2 sets - 10 reps - Seated Toe Towel Scrunches  - 1 x daily - 7 x weekly - 2-3 sets - 10 reps - 3 sec hold - Seated Ankle Circles  - 2 x daily - 7 x weekly - 2 sets - 10 reps - Long Sitting Calf Stretch with Strap  - 1 x daily - 7 x weekly - 3 sets - 15-20 sec hold  ASSESSMENT:  CLINICAL IMPRESSION:  09/21/2023: Sherri [name]  had good tolerance of today's treatment session, which focused on progression of weight bearing activities, including SLS balance and gait mechanics. Patient had fatigue with today's exercises, but had improved gait mechanics at end of session. We will continue to progress per POC as tolerated, in order to reach established rehab goals.    EVAL: Sherri [name]  is a 38 y.o. female who was seen today for physical therapy evaluation and treatment for L ankle pain with mobility deficts, 4 weeks s/p endoscopic gastroc recession and plantar fasciotomy. She is demonstrating decreased ankle ROM and has altered gait mechanics. She requires skilled PT services at this time to address relevant deficits, including ankle stability, balance and gait mechanics, and improve overall  function.     OBJECTIVE IMPAIRMENTS: Abnormal gait, decreased activity tolerance, decreased balance, decreased ROM, decreased strength, increased edema, and pain.   ACTIVITY LIMITATIONS: carrying, lifting, standing, squatting, stairs, and transfers  PARTICIPATION LIMITATIONS: meal prep, cleaning, shopping, community activity, and occupation  PERSONAL FACTORS: Past/current experiences and 3+ comorbidities: DM, Fibromyalgia, HTN, Depression, Obesity are also affecting patient's functional outcome.   REHAB POTENTIAL: Fair    CLINICAL DECISION MAKING: Evolving/moderate complexity  EVALUATION COMPLEXITY: Moderate   GOALS: Goals  reviewed with patient? YES  SHORT TERM GOALS: Target date: 09/22/2023   Patient will be independent with initial home program at least 3 days/week.  Baseline: provided at eval Goal Status: INITIAL   2.  Patient will demonstrate improved L ankle DF AROM by at least 5 degrees.  Baseline: lacking 12 degrees of DF  Goal Status: INITIAL   3.  Patient will report ambulating with BIL crutches or LRAD >75% of the day.  Baseline: Utilizing knee scooter for household and community ambulation Goal status: INITIAL   LONG TERM GOALS: Target date: 10/02/2023   Patient will report improved overall functional ability with LEFS score of 65 or greater.  Baseline: 52/80 Goal Status: INITIAL    2.  Patient will demonstrate improved L ankle DF AROM to at least 0-5 degrees.  Baseline: lacking 12 degrees of DF Goal status: INITIAL  3.  Patient will demonstrate ability to tolerate independent ambulation for at least 10 minutes.  Baseline: see gait assessment  Goal status: INITIAL  4.  Patient will demonstrate ability to ascend and descend stair height of at least 6 x 15 with minimal-to-no pain Baseline: severe pain/difficulty  Goal status: INITIAL      PLAN:  PT FREQUENCY: 1-2x/week  PT DURATION: 8 weeks  PLANNED INTERVENTIONS: 97164- PT Re-evaluation,  97750- Physical Performance Testing, 97110-Therapeutic exercises, 97530- Therapeutic activity, 97112- Neuromuscular re-education, 97535- Self Care, 02859- Manual therapy, [id number]- Gait training, [phone]- Aquatic Therapy, [phone]- Electrical stimulation (unattended), 97016- Vasopneumatic device, [id number]- Ionotophoresis 4mg /ml Dexamethasone , Patient/Family education, Balance training, Stair training, Cryotherapy, and Moist heat   [employer] Authorization   Choose one: Rehabilitative  Standardized Assessment or Functional Outcome Tool: See Pain Assessment and LEFS  Score or Percent Disability: 52/80  Body Parts Treated (Select each separately):  Ankle. Overall deficits/functional limitations for body part selected: moderate   If treatment provided at initial evaluation, no treatment charged due to lack of authorization.    PLAN FOR NEXT SESSION: progress as appropriate for post op status, including ankle AROM, strengthening, gait training, manual therapy and further assessment as indicated.    Marko Molt, PT, DPT  09/21/2023 10:47 AM

## 2023-09-23 ENCOUNTER — Ambulatory Visit

## 2023-09-23 DIAGNOSIS — R262 Difficulty in walking, not elsewhere classified: Secondary | ICD-10-CM

## 2023-09-23 DIAGNOSIS — M25672 Stiffness of left ankle, not elsewhere classified: Secondary | ICD-10-CM

## 2023-09-23 DIAGNOSIS — M79672 Pain in left foot: Secondary | ICD-10-CM | POA: Diagnosis not present

## 2023-09-23 NOTE — Therapy (Signed)
 OUTPATIENT PHYSICAL THERAPY NOTE   Patient Name: Sherri [name] MRN: [id number] DOB:1986/01/14, 38 y.o., female Today's Date: 09/23/2023   PT End of Session - 09/23/23 1055     Visit Number 9    Number of Visits 16    Date for PT Re-Evaluation 10/20/23    Authorization Type [employer]    Authorization Time Period 09/07/23-10/22/23    Authorization - Visit Number 6    Authorization - Number of Visits 8    PT Start Time 1050    PT Stop Time 1128    PT Time Calculation (min) 38 min    Activity Tolerance Patient tolerated treatment well    Behavior During Therapy WFL for tasks assessed/performed                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    Urinary tract infection    Uterine fibroid    Past Surgical History:  Procedure Laterality Date   APPENDECTOMY     CESAREAN SECTION N/A 06/05/2014   Procedure: CESAREAN SECTION;  Surgeon: Lang JINNY Peel, DO;  Location: [facility];  Service: Obstetrics;  Laterality: N/A;   FOOT SURGERY     LAPAROSCOPIC APPENDECTOMY N/A 07/10/2023   Procedure: APPENDECTOMY, LAPAROSCOPIC;  Surgeon: Ann Fine, MD;  Location: [facility];  Service: General;  Laterality: N/A;   WISDOM TOOTH EXTRACTION     Patient Active Problem List   Diagnosis Date Noted   Acute appendicitis 07/10/2023   Initiation of Depo Provera  06/29/2023   Numbness and tingling of hand 05/13/2023   Moderate episode of recurrent major depressive disorder (HCC) 07/16/2022   Pre-diabetes 07/23/2021   Anxiety and depression 06/24/2021   Fibromyalgia 06/24/2021   Chronic sinusitis 12/26/2020   Sinus pressure 12/26/2020   Prediabetes 07/05/2020   Gastroesophageal reflux disease 04/26/2020   Perianal rash 03/26/2020   Chronic nausea 03/26/2020   Chronic constipation 03/09/2019    Pelvic pain in female 03/09/2019   Hypertension    Genital herpes    Uterine fibroid 12/25/2013   Obesity 12/25/2013   Bacterial vaginitis 04/27/2012    PCP: Doreene Maude JINNY, MD   REFERRING PROVIDER: Silva Juliene SAUNDERS, DPM  REFERRING DIAG: Bilateral plantar fasciitis [M72.2], 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 Patient is status post endoscopic gastroc recession and plantar fasciotomy bilateral, would like to include ROM, strengthening, stability, and tissue manipulation as needed and conditioning.  Weightbearing as tolerated, start therapy after June 23. Modalities PRN at therapist's discretion.    THERAPY DIAG:  Pain in Left Foot Difficulty in walking, not elsewhere classified Stiffness of Left ankle, not elsewhere classified Localized edema  Rationale for Evaluation and Treatment: Rehabilitation  ONSET DATE: 07/30/2023  SUBJECTIVE:  SUBJECTIVE STATEMENT: 09/23/2023 Patient reports that she felt fine after last session, and was able to work on HEP yesterday.    EVAL: Patient presents to our clinic today approx 4 weeks s/p LEFT endoscopic gastroc recession and plantar fasciotomy. She has history of same procedure on right in 4/4 with subsequent PT at our location. She states that she has not begun working on any exercises prior to today's evaluation. She is WBAT in boot and primarily using her knee scooter at home.    PERTINENT HISTORY: Relevant PMHx includes DM, Fibromyalgia, HTN, Depression, Obesity  Patient scheduled for L Carpal Tunnel Release procedure on 09/01/2023.  PAIN:  Are you having pain?  Yes: NPRS scale: 8/10 Pain  location: L posterior ankle  Pain description: soreness Aggravating factors: worse  Relieving factors: rest  PRECAUTIONS: None  RED FLAGS: None   WEIGHT BEARING RESTRICTIONS: Yes WBAT with boot   FALLS:  Has patient fallen in last 6 months? 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 To be able to perform household cleaning.   NEXT MD VISIT: 09/09/2023  OBJECTIVE:  Note: Objective measures were completed at Evaluation unless otherwise noted.   PATIENT SURVEYS:  LEFS: 52/80  COGNITION: Overall cognitive status: Within functional limits for tasks assessed     SENSATION: Not tested  LOWER EXTREMITY ROM:  Active ROM Right eval Left eval  Hip flexion    Hip extension    Hip abduction    Hip adduction    Hip internal rotation    Hip external rotation    Knee flexion    Knee extension    Ankle dorsiflexion -12   Ankle plantarflexion 35   Ankle inversion 22   Ankle eversion 5    (Blank rows = not tested)  LOWER EXTREMITY MMT:  MMT Right eval Left eval  Hip flexion    Hip extension    Hip abduction    Hip adduction    Hip internal rotation    Hip external rotation    Knee flexion    Knee extension    Ankle dorsiflexion    Ankle plantarflexion    Ankle inversion    Ankle eversion     (Blank rows = not tested)   FUNCTIONAL TESTS:  2 minute walk test: deferred  GAIT: Distance walked: 50 ft Assistive device utilized: Knee Scooter Level of assistance: Modified independence Comments: Deferred formal gait assessment d/t patient has been ambulating with knee scooter since surgery.   SLS R: 17 sec  L: 4 sec  TREATMENT DATE:    [facility] Adult PT Treatment:                                                DATE:09/23/2023   Therapeutic Activity: (at parallel bars)  Nustep L4 x 10 minutes Hurdle step overs 4 laps forward (4 hurdles + 1 airex pad in middle) + 3 laps laterally   Therapeutic Exercise:  Seated heel raise x 20 with 10lb KB BIL  STS 4 x 5 SLS 3 x 30 sec  Stair Taps x 15 each    [facility] Adult PT Treatment:                                                DATE: 09/21/2023  At Parallel bars (Therapeutic Activity)  Nustep L4 x 10 minutes Hurdle step overs 4 laps forward (4 hurdles + 1 airex pad in middle) + 2 laps laterally    Reports fatigue, requiring seated rest break   Therapeutic Exercise:  Seated heel raise 2 x 10 with 10lb KB  STS 2 x 5  SLS 3 x 30 sec    [facility] Adult PT Treatment:                                                DATE: 09/16/2023  Therex: Ankle PF/DF on dynadisc  Ankle circles on dynadisc  Resisted ankle 4-way with RTB x 2x10 Seated Toe Curling/Spreading 2 x 10  Seated calf stretch, 3 x 20 sec   Therapeutic Activity:  nu-step L3 58m while taking subjective and planning session with patient Hurdle step overs lateral, 2 x 5  Hurdle step overs forward, 2 x 5 each   Self-Care: Patient education regarding transitioning to normal/supportive footwear. Discouraged use of slides/flip flops.     HOME EXERCISE PROGRAM: Access Code: [email or web address] URL: [email or web address] Date: 09/14/2023 Prepared by: Marko Molt  Exercises - Supine Ankle Pumps  - 2 x daily - 7 x weekly - 2 sets - 10 reps - Ankle Inversion Eversion Towel Slide  - 2 x daily - 7 x weekly - 2 sets - 10 reps - Seated Toe Towel Scrunches  - 1 x daily - 7 x weekly - 2-3 sets - 10 reps - 3 sec hold - Seated Ankle Circles  - 2 x daily - 7 x weekly - 2 sets - 10 reps - Long Sitting Calf Stretch with Strap  - 1  x daily - 7 x weekly - 3 sets - 15-20 sec hold  ASSESSMENT:  CLINICAL IMPRESSION:  09/23/2023: Sherri [name]  had good tolerance of today's treatment session, which focused on progression of weight bearing activities, including SLS balance and gait mechanics. Patient had fatigue with today's exercises, but had improved gait mechanics at end of session. We will continue to progress per POC as tolerated, in order to reach established rehab goals.    EVAL: Sherri [name]  is a 38 y.o. female who was seen today for physical therapy evaluation and treatment for L ankle pain with mobility deficts, 4 weeks s/p endoscopic gastroc recession and plantar fasciotomy. She is demonstrating  decreased ankle ROM and has altered gait mechanics. She requires skilled PT services at this time to address relevant deficits, including ankle stability, balance and gait mechanics, and improve overall function.     OBJECTIVE IMPAIRMENTS: Abnormal gait, decreased activity tolerance, decreased balance, decreased ROM, decreased strength, increased edema, and pain.   ACTIVITY LIMITATIONS: carrying, lifting, standing, squatting, stairs, and transfers  PARTICIPATION LIMITATIONS: meal prep, cleaning, shopping, community activity, and occupation  PERSONAL FACTORS: Past/current experiences and 3+ comorbidities: DM, Fibromyalgia, HTN, Depression, Obesity are also affecting patient's functional outcome.   REHAB POTENTIAL: Fair    CLINICAL DECISION MAKING: Evolving/moderate complexity  EVALUATION COMPLEXITY: Moderate   GOALS: Goals reviewed with patient? YES  SHORT TERM GOALS: Target date: 09/22/2023   Patient will be independent with initial home program at least 3 days/week.  Baseline: provided at eval Goal Status: INITIAL   2.  Patient will demonstrate improved L ankle DF AROM by at least 5 degrees.  Baseline: lacking 12 degrees of DF  Goal Status: INITIAL   3.  Patient will report ambulating with BIL crutches or LRAD >75% of  the day.  Baseline: Utilizing knee scooter for household and community ambulation Goal status: INITIAL   LONG TERM GOALS: Target date: 10/02/2023   Patient will report improved overall functional ability with LEFS score of 65 or greater.  Baseline: 52/80 Goal Status: INITIAL    2.  Patient will demonstrate improved L ankle DF AROM to at least 0-5 degrees.  Baseline: lacking 12 degrees of DF Goal status: INITIAL  3.  Patient will demonstrate ability to tolerate independent ambulation for at least 10 minutes.  Baseline: see gait assessment  Goal status: INITIAL  4.  Patient will demonstrate ability to ascend and descend stair height of at least 6 x 15 with minimal-to-no pain Baseline: severe pain/difficulty  Goal status: INITIAL      PLAN:  PT FREQUENCY: 1-2x/week  PT DURATION: 8 weeks  PLANNED INTERVENTIONS: 97164- PT Re-evaluation, 97750- Physical Performance Testing, 97110-Therapeutic exercises, 97530- Therapeutic activity, 97112- Neuromuscular re-education, 97535- Self Care, 02859- Manual therapy, [id number]- Gait training, [id number]- Aquatic Therapy, [phone]- Electrical stimulation (unattended), 97016- Vasopneumatic device, [id number]- Ionotophoresis 4mg /ml Dexamethasone , Patient/Family education, Balance training, Stair training, Cryotherapy, and Moist heat   [employer] Authorization   Choose one: Rehabilitative  Standardized Assessment or Functional Outcome Tool: See Pain Assessment and LEFS  Score or Percent Disability: 52/80  Body Parts Treated (Select each separately):  Ankle. Overall deficits/functional limitations for body part selected: moderate   If treatment provided at initial evaluation, no treatment charged due to lack of authorization.    PLAN FOR NEXT SESSION: progress as appropriate for post op status, including ankle AROM, strengthening, gait training, manual therapy and further assessment as indicated.    Marko Molt, PT, DPT  09/23/2023 11:43  AM

## 2023-09-24 ENCOUNTER — Ambulatory Visit

## 2023-09-24 ENCOUNTER — Other Ambulatory Visit (HOSPITAL_COMMUNITY)
Admission: RE | Admit: 2023-09-24 | Discharge: 2023-09-24 | Disposition: A | Discharge status: Home / Self Care | Source: Ambulatory Visit | Attending: Obstetrics and Gynecology | Admitting: Obstetrics and Gynecology

## 2023-09-24 ENCOUNTER — Ambulatory Visit: Admitting: Obstetrics and Gynecology

## 2023-09-24 ENCOUNTER — Encounter: Payer: Self-pay | Admitting: Obstetrics and Gynecology

## 2023-09-24 VITALS — BP 120/80 | HR 90 | Ht 59.0 in | Wt 189.0 lb

## 2023-09-24 DIAGNOSIS — Z01419 Encounter for gynecological examination (general) (routine) without abnormal findings: Secondary | ICD-10-CM

## 2023-09-24 DIAGNOSIS — J309 Allergic rhinitis, unspecified: Secondary | ICD-10-CM | POA: Diagnosis not present

## 2023-09-24 LAB — POCT URINALYSIS DIPSTICK
Bilirubin, UA: NEGATIVE
Glucose, UA: NEGATIVE
Ketones, UA: NEGATIVE
Nitrite, UA: NEGATIVE
Protein, UA: NEGATIVE
Spec Grav, UA: >=1.03 — AB (ref 1.010–1.025)
Urobilinogen, UA: 0.2 U/dL
pH, UA: 5 (ref 5.0–8.0)

## 2023-09-24 NOTE — Progress Notes (Signed)
 38 y.o. GYN presents for AEX/PAP.  C/o urinary frequency.

## 2023-09-24 NOTE — Progress Notes (Signed)
 Subjective:     Sherri [name] is a 38 y.o. female P2 with LMP 07/20/23 and BMI 38 who is here for a comprehensive physical exam. The patient reports some dysuria and urinary frequency. She is not sexually active. She reports amenorrhea x 1 month. Onset of vaginal bleeding today. She denies pelvic or abnormal discharge. She has not started lysteda  due to amenorrhea but plans on trying. Patient is without any other complaints.   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    Urinary tract infection    Uterine fibroid    Past Surgical History:  Procedure Laterality Date   APPENDECTOMY     CESAREAN SECTION N/A 06/05/2014   Procedure: CESAREAN SECTION;  Surgeon: Lang JINNY Peel, DO;  Location: [facility];  Service: Obstetrics;  Laterality: N/A;   FOOT SURGERY     LAPAROSCOPIC APPENDECTOMY N/A 07/10/2023   Procedure: APPENDECTOMY, LAPAROSCOPIC;  Surgeon: Ann Fine, MD;  Location: [facility];  Service: General;  Laterality: N/A;   WISDOM TOOTH EXTRACTION     Family History  Problem Relation Age of Onset   Hypertension Mother    Hypertension Father    Breast cancer Maternal Grandmother        24   Colon cancer Neg Hx    Colon polyps Neg Hx    Kidney disease Neg Hx    Diabetes Neg Hx    Esophageal cancer Neg Hx    Gallbladder disease Neg Hx    Heart disease Neg Hx    Asthma Neg Hx    Stroke Neg Hx    Stomach cancer Neg Hx     Social History   Socioeconomic History   Marital status: Single    Spouse name: Not on file   Number of children: Not on file   Years of education: Not on file   Highest education level: Not on file  Occupational History   Occupation: [employer]  Tobacco Use   Smoking status: Never    Passive exposure: Never   Smokeless tobacco: Never  Vaping Use   Vaping status:  Never Used  Substance and Sexual Activity   Alcohol use: No   Drug use: Never   Sexual activity: Yes    Birth control/protection: None  Other Topics Concern   Not on file  Social History Narrative   Are you right handed or left handed? Right   Are you currently employed ? Yes   What is your current occupation? [employer]   Do you live at home alone? Yes    Who lives with you?    What type of home do you live in: 1 story or 2 story? 1a      Social Drivers of Corporate investment banker Strain: At Risk (09/10/2023)   Received from [facility]    Hard to pay for: Utilities: 2  Food Insecurity: Low Risk  (09/15/2023)   Received from [facility]   Hunger Vital Sign    Within the past 12 months, you worried that your food would run out before you got money to buy more: Never true    Within the past 12 months, the food you bought just didn't last and you didn't have money to get more. : Never  true  Transportation Needs: No Transportation Needs (09/15/2023)   Received from [facility]    In the past 12 months, has lack of reliable transportation kept you from medical appointments, meetings, work or from getting things needed for daily living? : No  Physical Activity: Not on File (02/03/2022)   Received from [facility]   Physical Activity    Physical Activity: 0  Stress: Not on File (02/03/2022)   Received from [facility]   Stress    Stress: 0  Social Connections: Not at Risk (09/10/2023)   Received from [facility]   Social Connections    How often do you see or talk to people that you care about and feel close to? (For example: talking to friends on phone, visiting friends or family, going to church or club meetings): 1  Intimate Partner Violence: Not At Risk (07/10/2023)   Humiliation, Afraid, Rape, and Kick questionnaire    Fear of Current or Ex-Partner: No    Emotionally Abused: No    Physically Abused: No    Sexually Abused: No   Health  Maintenance  Topic Date Due   Hepatitis B Vaccines (1 of 3 - 19+ 3-dose series) Never done   HPV VACCINES (1 - 3-dose SCDM series) Never done   COVID-19 Vaccine (2 - 2024-25 season) 11/01/2022   INFLUENZA VACCINE  10/01/2023   DTaP/Tdap/Td (3 - Td or Tdap) 08/31/2026   Cervical Cancer Screening (HPV/Pap Cotest)  04/02/2027   Hepatitis C Screening  Completed   HIV Screening  Completed   Pneumococcal Vaccine 58-9 Years old  Aged Out   Meningococcal B Vaccine  Aged Out       Review of Systems Pertinent items noted in HPI and remainder of comprehensive ROS otherwise negative.   Objective:  Blood pressure 120/80, pulse 90, height 4' 11 (1.499 m), weight 189 lb (85.7 kg), last menstrual period 07/20/2023.   GENERAL: Well-developed, well-nourished female in no acute distress.  HEENT: Normocephalic, atraumatic. Sclerae anicteric.  NECK: Supple. Normal thyroid .  LUNGS: Clear to auscultation bilaterally.  HEART: Regular rate and rhythm. BREASTS: Symmetric in size. No palpable masses or lymphadenopathy, skin changes, or nipple drainage. ABDOMEN: Soft, nontender, nondistended. No organomegaly. PELVIC: Normal external female genitalia. Vagina is pink and rugated.  Normal discharge. Normal appearing cervix. Bimanual exam limited secondary to body habitus. No adnexal mass or tenderness. Chaperone present during the pelvic exam EXTREMITIES: No cyanosis, clubbing, or edema, 2+ distal pulses.     Assessment:    Healthy female exam.      Plan:    Pap smear collected Urine culture collected Patient will be contacted with abnormal results  See After Visit Summary for Counseling Recommendations

## 2023-09-26 LAB — URINE CULTURE

## 2023-09-27 ENCOUNTER — Other Ambulatory Visit: Payer: Self-pay

## 2023-09-27 ENCOUNTER — Other Ambulatory Visit (HOSPITAL_COMMUNITY): Payer: Self-pay

## 2023-09-29 ENCOUNTER — Other Ambulatory Visit (HOSPITAL_COMMUNITY): Payer: Self-pay

## 2023-09-29 LAB — CYTOLOGY - PAP
Comment: NEGATIVE
Diagnosis: NEGATIVE
High risk HPV: NEGATIVE

## 2023-09-30 ENCOUNTER — Ambulatory Visit

## 2023-09-30 DIAGNOSIS — M79672 Pain in left foot: Secondary | ICD-10-CM

## 2023-09-30 DIAGNOSIS — R262 Difficulty in walking, not elsewhere classified: Secondary | ICD-10-CM

## 2023-09-30 DIAGNOSIS — R600 Localized edema: Secondary | ICD-10-CM

## 2023-09-30 DIAGNOSIS — M25672 Stiffness of left ankle, not elsewhere classified: Secondary | ICD-10-CM

## 2023-09-30 NOTE — Therapy (Signed)
 OUTPATIENT PHYSICAL THERAPY NOTE   Patient Name: Sherri [name]  Sherri [name] MRN: [id number] DOB:06/06/1985, 38 y.o., female Today's Date: 09/30/2023   PT End of Session - 09/30/23 0919     Visit Number 10    Number of Visits 16    Date for PT Re-Evaluation 10/20/23    Authorization Type [facility]    Authorization Time Period 09/07/23-10/22/23    Authorization - Visit Number 7    Authorization - Number of Visits 8    PT Start Time 0915    PT Stop Time 0953    PT Time Calculation (min) 38 min    Activity Tolerance Patient tolerated treatment well    Behavior During Therapy WFL for tasks assessed/performed                 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    Urinary tract infection    Uterine fibroid    Past Surgical History:  Procedure Laterality Date   APPENDECTOMY     CESAREAN SECTION N/A 06/05/2014   Procedure: CESAREAN SECTION;  Surgeon: Lang JINNY Peel, DO;  Location: [facility];  Service: Obstetrics;  Laterality: N/A;   FOOT SURGERY     LAPAROSCOPIC APPENDECTOMY N/A 07/10/2023   Procedure: APPENDECTOMY, LAPAROSCOPIC;  Surgeon: Ann Fine, MD;  Location: [facility];  Service: General;  Laterality: N/A;   WISDOM TOOTH EXTRACTION     Patient Active Problem List   Diagnosis Date Noted   Acute appendicitis 07/10/2023   Initiation of Depo Provera  06/29/2023   Numbness and tingling of hand 05/13/2023   Moderate episode of recurrent major depressive disorder (HCC) 07/16/2022   Pre-diabetes 07/23/2021   Anxiety and depression 06/24/2021   Fibromyalgia 06/24/2021   Chronic sinusitis 12/26/2020   Sinus pressure 12/26/2020   Prediabetes 07/05/2020   Gastroesophageal reflux disease 04/26/2020   Perianal rash 03/26/2020   Chronic nausea 03/26/2020   Chronic constipation  03/09/2019   Pelvic pain in female 03/09/2019   Hypertension    Genital herpes    Uterine fibroid 12/25/2013   Obesity 12/25/2013   Bacterial vaginitis 04/27/2012    PCP: Doreene Maude JINNY, MD   REFERRING PROVIDER: Silva Juliene SAUNDERS, DPM  REFERRING DIAG: Bilateral plantar fasciitis [M72.2], 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 Patient is status post endoscopic gastroc recession and plantar fasciotomy bilateral, would like to include ROM, strengthening, stability, and tissue manipulation as needed and conditioning.  Weightbearing as tolerated, start therapy after June 23. Modalities PRN at therapist's discretion.    THERAPY DIAG:  Pain in Left Foot Difficulty in walking, not elsewhere classified Stiffness of Left ankle, not elsewhere classified Localized edema  Rationale for Evaluation and Treatment: Rehabilitation  ONSET DATE: 07/30/2023  SUBJECTIVE:  SUBJECTIVE STATEMENT: 09/30/2023 Patient reports that she has been resting more this week, she continues to feel some stiffness in the back of her ankle. Continues to be compliant with HEP.   EVAL: Patient presents to our clinic today approx 4 weeks s/p LEFT endoscopic gastroc recession and plantar fasciotomy. She has history of same procedure on right in 4/4 with subsequent PT at our location. She states that she has not begun working on any exercises prior to today's evaluation. She is WBAT in boot and primarily using her knee scooter at home.    PERTINENT HISTORY: Relevant PMHx includes DM, Fibromyalgia, HTN, Depression, Obesity  Patient scheduled for L Carpal Tunnel Release procedure on  09/01/2023.  PAIN:  Are you having pain?  Yes: NPRS scale: 8/10 Pain location: L posterior ankle  Pain description: soreness Aggravating factors: worse  Relieving factors: rest  PRECAUTIONS: None  RED FLAGS: None   WEIGHT BEARING RESTRICTIONS: Yes WBAT with boot   FALLS:  Has patient fallen in last 6 months? 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 To be able to perform household cleaning.   NEXT MD VISIT: 09/09/2023  OBJECTIVE:  Note: Objective measures were completed at Evaluation unless otherwise noted.   PATIENT SURVEYS:  LEFS: 52/80  COGNITION: Overall cognitive status: Within functional limits for tasks assessed     SENSATION: Not tested  LOWER EXTREMITY ROM:  Active ROM Right eval Left eval  Hip flexion    Hip extension    Hip abduction    Hip adduction    Hip internal rotation    Hip external rotation    Knee flexion    Knee extension    Ankle dorsiflexion -12   Ankle plantarflexion 35   Ankle inversion 22   Ankle eversion 5    (Blank rows = not tested)  LOWER EXTREMITY MMT:  MMT Right eval Left eval  Hip flexion    Hip extension    Hip abduction    Hip adduction    Hip internal rotation    Hip external rotation    Knee flexion    Knee extension    Ankle dorsiflexion    Ankle plantarflexion    Ankle inversion    Ankle eversion     (Blank rows = not tested)   FUNCTIONAL TESTS:  2 minute walk test: deferred  GAIT: Distance walked: 50 ft Assistive device utilized: Knee Scooter Level of assistance: Modified independence Comments: Deferred formal gait assessment d/t patient has been ambulating with knee scooter since surgery.    SLS R: 17 sec  L: 4 sec  TREATMENT DATE:   [facility] Adult PT Treatment:                                                DATE: 09/30/2023  Therapeutic Activity:  Nustep L6 x 6 minutes (at parallel bars) Hurdle step overs (4 hurdles, 1 airex, 1 theraband pad), 3 laps forward, 3 laps laterally  Step Ups x 3 (4 stairs)   Therapeutic Exercise:  Standing calf stretch 3 x 30 sec on wedge Standing toe raises x 20  Towel scrunches x 10  Stair Taps x 15 each  Seated ankle inversion/eversion x 15 each  Updated and reviewed initial HEP   [facility] Adult PT Treatment:                                                DATE:09/23/2023   Therapeutic Activity: (at parallel bars)  Nustep L4 x 10 minutes Hurdle step overs 4 laps forward (4 hurdles + 1 airex pad in middle) + 3 laps laterally   Therapeutic Exercise:  Seated heel raise x 20 with 10lb KB BIL  STS 4 x 5 SLS 3 x 30 sec  Stair Taps x 15 each    [facility] Adult PT Treatment:                                                DATE: 09/21/2023  At Parallel bars (Therapeutic Activity)  Nustep L4 x 10 minutes Hurdle step overs 4 laps forward (4 hurdles + 1 airex pad in middle) + 2 laps laterally    Reports fatigue, requiring seated rest break   Therapeutic Exercise:  Seated heel raise 2 x 10 with 10lb KB  STS 2 x 5  SLS 3 x 30 sec    [facility] Adult PT Treatment:                                                DATE: 09/16/2023  Therex: Ankle PF/DF on dynadisc  Ankle circles on dynadisc  Resisted ankle 4-way with RTB x 2x10 Seated Toe Curling/Spreading 2 x 10  Seated calf stretch, 3 x 20 sec   Therapeutic Activity:  nu-step L3 35m while taking subjective and planning session with patient Hurdle step overs lateral, 2 x 5  Hurdle step overs forward, 2 x 5 each   Self-Care: Patient education regarding transitioning to  normal/supportive footwear. Discouraged use of slides/flip flops.     HOME EXERCISE PROGRAM: Access Code: [email or web address] URL: [email or web address] Date: 09/14/2023 Prepared by: Marko Molt  Exercises - Supine Ankle Pumps  - 2 x daily - 7 x weekly - 2 sets - 10 reps - Ankle Inversion Eversion Towel Slide  - 2 x daily - 7 x weekly - 2 sets - 10 reps - Seated Toe Towel Scrunches  - 1 x daily - 7 x weekly - 2-3 sets - 10 reps - 3 sec hold - Seated Ankle Circles  - 2 x daily -  7 x weekly - 2 sets - 10 reps - Long Sitting Calf Stretch with Strap  - 1 x daily - 7 x weekly - 3 sets - 15-20 sec hold  ASSESSMENT:  CLINICAL IMPRESSION:  09/30/2023: Sherri [name]  had fair tolerance of today's treatment session, with additional calf stretch activities to address reported stiffness. Patient had some difficulty with heel raises today, but had improved tolerance of toe raises. We will continue to progress per POC as tolerated, in order to reach established rehab goals.    EVAL: Sherri [name]  is a 38 y.o. female who was seen today for physical therapy evaluation and treatment for L ankle pain with mobility deficts, 4 weeks s/p endoscopic gastroc recession and plantar fasciotomy. She is demonstrating decreased ankle ROM and has altered gait mechanics. She requires skilled PT services at this time to address relevant deficits, including ankle stability, balance and gait mechanics, and improve overall function.     OBJECTIVE IMPAIRMENTS: Abnormal gait, decreased activity tolerance, decreased balance, decreased ROM, decreased strength, increased edema, and pain.   ACTIVITY LIMITATIONS: carrying, lifting, standing, squatting, stairs, and transfers  PARTICIPATION LIMITATIONS: meal prep, cleaning, shopping, community activity, and occupation  PERSONAL FACTORS: Past/current experiences and 3+ comorbidities: DM, Fibromyalgia, HTN, Depression, Obesity are also affecting patient's functional outcome.   REHAB  POTENTIAL: Fair    CLINICAL DECISION MAKING: Evolving/moderate complexity  EVALUATION COMPLEXITY: Moderate   GOALS: Goals reviewed with patient? YES  SHORT TERM GOALS: Target date: 09/22/2023   Patient will be independent with initial home program at least 3 days/week.  Baseline: provided at eval Goal Status: INITIAL   2.  Patient will demonstrate improved L ankle DF AROM by at least 5 degrees.  Baseline: lacking 12 degrees of DF  Goal Status: INITIAL   3.  Patient will report ambulating with BIL crutches or LRAD >75% of the day.  Baseline: Utilizing knee scooter for household and community ambulation Goal status: INITIAL   LONG TERM GOALS: Target date: 10/02/2023   Patient will report improved overall functional ability with LEFS score of 65 or greater.  Baseline: 52/80 Goal Status: INITIAL    2.  Patient will demonstrate improved L ankle DF AROM to at least 0-5 degrees.  Baseline: lacking 12 degrees of DF Goal status: INITIAL  3.  Patient will demonstrate ability to tolerate independent ambulation for at least 10 minutes.  Baseline: see gait assessment  Goal status: INITIAL  4.  Patient will demonstrate ability to ascend and descend stair height of at least 6 x 15 with minimal-to-no pain Baseline: severe pain/difficulty  Goal status: INITIAL      PLAN:  PT FREQUENCY: 1-2x/week  PT DURATION: 8 weeks  PLANNED INTERVENTIONS: 97164- PT Re-evaluation, 97750- Physical Performance Testing, 97110-Therapeutic exercises, 97530- Therapeutic activity, 97112- Neuromuscular re-education, 97535- Self Care, [id number]- Manual therapy, [id number]- Gait training, [id number]- Aquatic Therapy, [phone]- Electrical stimulation (unattended), 97016- Vasopneumatic device, [id number]- Ionotophoresis 4mg /ml Dexamethasone , Patient/Family education, Balance training, Stair training, Cryotherapy, and Moist heat   [employer] Authorization   Choose one: Rehabilitative  Standardized Assessment or Functional  Outcome Tool: See Pain Assessment and LEFS  Score or Percent Disability: 52/80  Body Parts Treated (Select each separately):  Ankle. Overall deficits/functional limitations for body part selected: moderate   If treatment provided at initial evaluation, no treatment charged due to lack of authorization.    PLAN FOR NEXT SESSION: progress as appropriate for post op status, including ankle AROM, strengthening, gait training, manual therapy and further assessment as indicated.  Marko Molt, PT, DPT  09/30/2023 10:15 AM

## 2023-10-01 ENCOUNTER — Other Ambulatory Visit (HOSPITAL_COMMUNITY): Payer: Self-pay

## 2023-10-02 ENCOUNTER — Other Ambulatory Visit (HOSPITAL_COMMUNITY): Payer: Self-pay

## 2023-10-05 ENCOUNTER — Ambulatory Visit

## 2023-10-07 ENCOUNTER — Ambulatory Visit: Attending: Podiatry

## 2023-10-07 DIAGNOSIS — M25672 Stiffness of left ankle, not elsewhere classified: Secondary | ICD-10-CM | POA: Diagnosis present

## 2023-10-07 DIAGNOSIS — R262 Difficulty in walking, not elsewhere classified: Secondary | ICD-10-CM | POA: Insufficient documentation

## 2023-10-07 DIAGNOSIS — R600 Localized edema: Secondary | ICD-10-CM | POA: Diagnosis present

## 2023-10-07 DIAGNOSIS — M79672 Pain in left foot: Secondary | ICD-10-CM | POA: Diagnosis present

## 2023-10-07 NOTE — Therapy (Addendum)
 OUTPATIENT PHYSICAL THERAPY NOTE   Patient Name: Sherri [name]  HAILEY STORMER MRN: [id number] DOB:05/16/1985, 38 y.o., female Today's Date: 10/07/2023   PT End of Session - 10/07/23 0918     Visit Number 11    Number of Visits 16    Date for PT Re-Evaluation 10/20/23    Authorization Type [facility]    Authorization Time Period 09/07/23-10/22/23    Authorization - Visit Number 8    Authorization - Number of Visits 8    PT Start Time 0918    PT Stop Time 0956    PT Time Calculation (min) 38 min    Activity Tolerance Patient tolerated treatment well    Behavior During Therapy WFL for tasks assessed/performed         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    Urinary tract infection    Uterine fibroid    Past Surgical History:  Procedure Laterality Date   APPENDECTOMY     CESAREAN SECTION N/A 06/05/2014   Procedure: CESAREAN SECTION;  Surgeon: Lang JINNY Peel, DO;  Location: [facility];  Service: Obstetrics;  Laterality: N/A;   FOOT SURGERY     LAPAROSCOPIC APPENDECTOMY N/A 07/10/2023   Procedure: APPENDECTOMY, LAPAROSCOPIC;  Surgeon: Ann Fine, MD;  Location: [facility];  Service: General;  Laterality: N/A;   WISDOM TOOTH EXTRACTION     Patient Active Problem List   Diagnosis Date Noted   Acute appendicitis 07/10/2023   Initiation of Depo Provera  06/29/2023   Numbness and tingling of hand 05/13/2023   Moderate episode of recurrent major depressive disorder (HCC) 07/16/2022   Pre-diabetes 07/23/2021   Anxiety and depression 06/24/2021   Fibromyalgia 06/24/2021   Chronic sinusitis 12/26/2020   Sinus pressure 12/26/2020   Prediabetes 07/05/2020   Gastroesophageal reflux disease 04/26/2020   Perianal rash 03/26/2020   Chronic nausea 03/26/2020   Chronic constipation 03/09/2019   Pelvic pain  in female 03/09/2019   Hypertension    Genital herpes    Uterine fibroid 12/25/2013   Obesity 12/25/2013   Bacterial vaginitis 04/27/2012    PCP: Doreene Maude JINNY, MD   REFERRING PROVIDER: Silva Juliene SAUNDERS, DPM  REFERRING DIAG: Bilateral plantar fasciitis [M72.2], 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 Patient is status post endoscopic gastroc recession and plantar fasciotomy bilateral, would like to include ROM, strengthening, stability, and tissue manipulation as needed and conditioning.  Weightbearing as tolerated, start therapy after June 23. Modalities PRN at therapist's discretion.    THERAPY DIAG:  Pain in Left Foot Difficulty in walking, not elsewhere classified Stiffness of Left ankle, not elsewhere classified Localized edema  Rationale for Evaluation and Treatment: Rehabilitation  ONSET DATE: 07/30/2023  Next MD follow up: 11/09/23 with Dr. Silva  SUBJECTIVE:  SUBJECTIVE STATEMENT: Patient reports that she is having some stiffness and mild pain in her feet today. She says she is having carpal tunnel surgery on her Rt side next Wednesday 10/13/23.   EVAL: Patient presents to our clinic today approx 4 weeks s/p LEFT endoscopic gastroc recession and plantar fasciotomy. She has history of same procedure on right in 4/4 with subsequent PT at our location. She states that she has not begun working on any exercises prior to today's evaluation. She is WBAT in boot and primarily using her knee scooter at home.    PERTINENT HISTORY: Relevant PMHx includes DM, Fibromyalgia, HTN, Depression, Obesity  Patient scheduled for L Carpal Tunnel  Release procedure on 09/01/2023.  PAIN:  Are you having pain?  Yes: NPRS scale: 8/10 Pain location: L posterior ankle  Pain description: soreness Aggravating factors: worse  Relieving factors: rest  PRECAUTIONS: None  RED FLAGS: None   WEIGHT BEARING RESTRICTIONS: Yes WBAT with boot   FALLS:  Has patient fallen in last 6 months? 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 To be able to perform household cleaning.   NEXT MD VISIT: 09/09/2023  OBJECTIVE:  Note: Objective measures were completed at Evaluation unless otherwise noted.   PATIENT SURVEYS:  LEFS: 52/80  COGNITION: Overall cognitive status: Within functional limits for tasks assessed     SENSATION: Not tested  LOWER EXTREMITY ROM:  Active ROM Right eval Left eval  Hip flexion    Hip extension    Hip abduction    Hip adduction    Hip internal rotation    Hip external rotation    Knee flexion    Knee extension    Ankle dorsiflexion -12   Ankle plantarflexion 35   Ankle inversion 22   Ankle eversion 5    (Blank rows = not tested)  LOWER EXTREMITY MMT:  MMT Right eval Left eval  Hip flexion    Hip extension    Hip abduction    Hip adduction    Hip internal rotation    Hip external rotation    Knee flexion    Knee extension    Ankle dorsiflexion    Ankle plantarflexion    Ankle inversion    Ankle eversion     (Blank rows = not tested)   FUNCTIONAL TESTS:  2 minute walk test: deferred  GAIT: Distance walked: 50 ft Assistive device utilized: Knee Scooter Level of assistance: Modified independence Comments: Deferred formal gait assessment d/t patient has been ambulating with knee  scooter since surgery.   SLS R: 17 sec  L: 4 sec  TREATMENT DATE:  [facility] Adult PT Treatment:                                                DATE: 10/07/23 Therapeutic Exercise: Standing calf stretch 3 x 30 sec on wedge Standing toe raises x 20  Stair Taps x 15 each  Therapeutic Activity: Nustep L6 x 6 minutes (at parallel bars) Hurdle step overs (4 hurdles, 1 airex, 1 theraband pad), 3 laps forward, 3 laps laterally  Step Ups x 3 (4 stairs)  FT stance on Airex x30   [facility] Adult PT Treatment:                                                DATE: 09/30/2023  Therapeutic Activity:  Nustep L6 x 6 minutes (at parallel bars) Hurdle step overs (4 hurdles, 1 airex, 1 theraband pad), 3 laps forward, 3 laps laterally  Step Ups x 3 (4 stairs)   Therapeutic Exercise:  Standing calf stretch 3 x 30 sec on wedge Standing toe raises x 20  Towel scrunches x 10  Stair Taps x 15 each  Seated ankle inversion/eversion x 15 each  Updated and reviewed initial HEP   [facility] Adult PT Treatment:                                                DATE:09/23/2023   Therapeutic Activity: (at parallel bars)  Nustep L4 x 10 minutes Hurdle step overs 4 laps forward (4 hurdles + 1 airex pad in middle) + 3 laps laterally   Therapeutic Exercise:  Seated heel raise x 20 with 10lb KB BIL  STS 4 x 5 SLS 3 x 30 sec  Stair Taps x 15 each    HOME EXERCISE PROGRAM: Access Code: [email or web address] URL: [email or web address] Date: 09/14/2023 Prepared by: Marko Molt  Exercises - Supine Ankle Pumps  - 2 x daily - 7 x weekly - 2 sets - 10 reps - Ankle Inversion Eversion Towel Slide  - 2 x daily - 7 x weekly - 2 sets - 10 reps - Seated Toe Towel Scrunches  - 1 x daily - 7 x weekly - 2-3 sets - 10 reps - 3 sec hold - Seated Ankle Circles  - 2 x daily - 7 x weekly - 2 sets - 10 reps -  Long Sitting Calf Stretch with Strap  - 1 x daily - 7 x weekly - 3 sets - 15-20 sec hold  ASSESSMENT:  CLINICAL IMPRESSION: Patient presents to PT reports continued stiffness in her calf and ankle and states that she has mild pain on the bottom of both feet. She has been compliant with HEP, says the exercises are going ok. Session today continued to focus distal LE strengthening, stair training, and balance tasks. Patient was able to tolerate all prescribed exercises with no adverse effects. Patient continues to benefit from skilled PT services and should be progressed as able to improve functional independence.   EVAL: Emilygrace  is a 38 y.o. female who was seen today for physical therapy evaluation and treatment for L ankle pain  with mobility deficts, 4 weeks s/p endoscopic gastroc recession and plantar fasciotomy. She is demonstrating decreased ankle ROM and has altered gait mechanics. She requires skilled PT services at this time to address relevant deficits, including ankle stability, balance and gait mechanics, and improve overall function.     OBJECTIVE IMPAIRMENTS: Abnormal gait, decreased activity tolerance, decreased balance, decreased ROM, decreased strength, increased edema, and pain.   ACTIVITY LIMITATIONS: carrying, lifting, standing, squatting, stairs, and transfers  PARTICIPATION LIMITATIONS: meal prep, cleaning, shopping, community activity, and occupation  PERSONAL FACTORS: Past/current experiences and 3+ comorbidities: DM, Fibromyalgia, HTN, Depression, Obesity are also affecting patient's functional outcome.   REHAB POTENTIAL: Fair    CLINICAL DECISION MAKING: Evolving/moderate complexity  EVALUATION COMPLEXITY: Moderate   GOALS: Goals reviewed with patient? YES  SHORT TERM GOALS: Target date: 09/22/2023   Patient will be independent with initial home program at least 3 days/week.  Baseline: provided at eval Goal Status: MET 10/07/23 Pt reports adherence   2.   Patient will demonstrate improved L ankle DF AROM by at least 5 degrees.  Baseline: lacking 12 degrees of DF  Goal Status: Ongoing   3.  Patient will report ambulating with BIL crutches or LRAD >75% of the day.  Baseline: Utilizing knee scooter for household and community ambulation Goal status: MET 10/07/23 Pt ambulates without AD 100% of the day.   LONG TERM GOALS: Target date: 10/02/2023   Patient will report improved overall functional ability with LEFS score of 65 or greater.  Baseline: 52/80 Goal Status: Ongoing    2.  Patient will demonstrate improved L ankle DF AROM to at least 0-5 degrees.  Baseline: lacking 12 degrees of DF Goal status: Ongoing  3.  Patient will demonstrate ability to tolerate independent ambulation for at least 10 minutes.  Baseline: see gait assessment  Goal status: Ongoing  4.  Patient will demonstrate ability to ascend and descend stair height of at least 6 x 15 with minimal-to-no pain Baseline: severe pain/difficulty  Goal status: Ongoing   PLAN:  PT FREQUENCY: 1-2x/week  PT DURATION: 8 weeks  PLANNED INTERVENTIONS: 97164- PT Re-evaluation, 97750- Physical Performance Testing, 97110-Therapeutic exercises, 97530- Therapeutic activity, 97112- Neuromuscular re-education, 97535- Self Care, 02859- Manual therapy, [id number]- Gait training, [phone]- Aquatic Therapy, [phone]- Electrical stimulation (unattended), 97016- Vasopneumatic device, [id number]- Ionotophoresis 4mg /ml Dexamethasone , Patient/Family education, Balance training, Stair training, Cryotherapy, and Moist heat   [employer] Authorization   Choose one: Rehabilitative  Standardized Assessment or Functional Outcome Tool: See Pain Assessment and LEFS  Score or Percent Disability: 52/80  Body Parts Treated (Select each separately):  Ankle. Overall deficits/functional limitations for body part selected: moderate   If treatment provided at initial evaluation, no treatment charged due to lack of  authorization.    PLAN FOR NEXT SESSION: progress as appropriate for post op status, including ankle AROM, strengthening, gait training, manual therapy and further assessment as indicated.   Corean Pouch PTA  10/07/2023 9:58 AM

## 2023-10-08 ENCOUNTER — Other Ambulatory Visit: Payer: Self-pay

## 2023-10-08 ENCOUNTER — Other Ambulatory Visit: Payer: Self-pay | Admitting: Podiatry

## 2023-10-08 ENCOUNTER — Other Ambulatory Visit (HOSPITAL_COMMUNITY): Payer: Self-pay

## 2023-10-12 ENCOUNTER — Other Ambulatory Visit (HOSPITAL_COMMUNITY): Payer: Self-pay

## 2023-10-12 ENCOUNTER — Ambulatory Visit: Admitting: Physical Therapy

## 2023-10-12 ENCOUNTER — Other Ambulatory Visit: Payer: Self-pay

## 2023-10-12 ENCOUNTER — Encounter: Payer: Self-pay | Admitting: Physical Therapy

## 2023-10-12 DIAGNOSIS — M79672 Pain in left foot: Secondary | ICD-10-CM

## 2023-10-12 DIAGNOSIS — R262 Difficulty in walking, not elsewhere classified: Secondary | ICD-10-CM

## 2023-10-12 DIAGNOSIS — M25672 Stiffness of left ankle, not elsewhere classified: Secondary | ICD-10-CM

## 2023-10-12 DIAGNOSIS — R600 Localized edema: Secondary | ICD-10-CM

## 2023-10-12 MED ORDER — ATENOLOL-CHLORTHALIDONE 50-25 MG PO TABS
1.0000 | ORAL_TABLET | Freq: Every day | ORAL | 0 refills | Status: AC
  Filled 2023-10-12: qty 90, 90d supply, fill #0

## 2023-10-12 NOTE — Addendum Note (Signed)
 Addended by: DOMINIC HELENE BRAVO on: 10/12/2023 11:22 AM   Modules accepted: Orders

## 2023-10-12 NOTE — Therapy (Signed)
 OUTPATIENT PHYSICAL THERAPY NOTE   Patient Name: Sherri [name]  INDONESIA MCKEOUGH MRN: [id number] DOB:[birth year], 38 y.o., female Today's Date: 10/12/2023   PT End of Session - 10/12/23 1019     Visit Number 12    Number of Visits 16    Date for PT Re-Evaluation 11/23/23    Authorization Type [employer]    Authorization Time Period 09/07/23-10/22/23    Authorization - Number of Visits 8    PT Start Time 1015    PT Stop Time 1056    PT Time Calculation (min) 41 min    Activity Tolerance Patient tolerated treatment well    Behavior During Therapy WFL for tasks assessed/performed         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    Urinary tract infection    Uterine fibroid    Past Surgical History:  Procedure Laterality Date   APPENDECTOMY     CESAREAN SECTION N/A 06/05/2014   Procedure: CESAREAN SECTION;  Surgeon: Lang JINNY Peel, DO;  Location: [facility];  Service: Obstetrics;  Laterality: N/A;   FOOT SURGERY     LAPAROSCOPIC APPENDECTOMY N/A 07/10/2023   Procedure: APPENDECTOMY, LAPAROSCOPIC;  Surgeon: Ann Fine, MD;  Location: [facility];  Service: General;  Laterality: N/A;   WISDOM TOOTH EXTRACTION     Patient Active Problem List   Diagnosis Date Noted   Acute appendicitis 07/10/2023   Initiation of Depo Provera  06/29/2023   Numbness and tingling of hand 05/13/2023   Moderate episode of recurrent major depressive disorder (HCC) 07/16/2022   Pre-diabetes 07/23/2021   Anxiety and depression 06/24/2021   Fibromyalgia 06/24/2021   Chronic sinusitis 12/26/2020   Sinus pressure 12/26/2020   Prediabetes 07/05/2020   Gastroesophageal reflux disease 04/26/2020   Perianal rash 03/26/2020   Chronic nausea 03/26/2020   Chronic constipation 03/09/2019   Pelvic pain in female 03/09/2019    Hypertension    Genital herpes    Uterine fibroid 12/25/2013   Obesity 12/25/2013   Bacterial vaginitis 04/27/2012    PCP: Doreene Maude JINNY, MD   REFERRING PROVIDER: Silva Juliene SAUNDERS, DPM  REFERRING DIAG: Bilateral plantar fasciitis [M72.2], 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 Patient is status post endoscopic gastroc recession and plantar fasciotomy bilateral, would like to include ROM, strengthening, stability, and tissue manipulation as needed and conditioning.  Weightbearing as tolerated, start therapy after June 23. Modalities PRN at therapist's discretion.    THERAPY DIAG:  Pain in Left Foot Difficulty in walking, not elsewhere classified Stiffness of Left ankle, not elsewhere classified Localized edema  Rationale for Evaluation and Treatment: Rehabilitation  ONSET DATE: 07/30/2023  Next MD follow up: 11/09/23 with Dr. Silva  SUBJECTIVE:  SUBJECTIVE STATEMENT: Pt reports that she is having the most pain at night.  H  EVAL: Patient presents to our clinic today approx 4 weeks s/p LEFT endoscopic gastroc recession and plantar fasciotomy. She has history of same procedure on right in 4/4 with subsequent PT at our location. She states that she has not begun working on any exercises prior to today's evaluation. She is WBAT in boot and primarily using her knee scooter at home.    PERTINENT HISTORY: Relevant PMHx includes DM, Fibromyalgia, HTN, Depression, Obesity  Patient scheduled for L Carpal Tunnel Release procedure on 09/01/2023.  PAIN:  Are you having pain?  Yes: NPRS scale: 7/10 Pain location: L posterior ankle  Pain  description: soreness Aggravating factors: worse  Relieving factors: rest  PRECAUTIONS: None  RED FLAGS: None   WEIGHT BEARING RESTRICTIONS: Yes WBAT with boot   FALLS:  Has patient fallen in last 6 months? 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 To be able to perform household cleaning.   NEXT MD VISIT: 09/09/2023  OBJECTIVE:  Note: Objective measures were completed at Evaluation unless otherwise noted.   PATIENT SURVEYS:  LEFS: 52/80  COGNITION: Overall cognitive status: Within functional limits for tasks assessed     SENSATION: Not tested  LOWER EXTREMITY ROM:  Active ROM Right eval Left eval  Hip flexion    Hip extension    Hip abduction    Hip adduction    Hip internal rotation    Hip external rotation    Knee flexion    Knee extension    Ankle dorsiflexion -12   Ankle plantarflexion 35   Ankle inversion 22   Ankle eversion 5    (Blank rows = not tested)  LOWER EXTREMITY MMT:  MMT Right eval Left eval  Hip flexion    Hip extension    Hip abduction    Hip adduction    Hip internal rotation    Hip external rotation    Knee flexion    Knee extension    Ankle dorsiflexion    Ankle plantarflexion    Ankle inversion    Ankle eversion     (Blank rows = not tested)   FUNCTIONAL TESTS:  2 minute walk test: deferred  GAIT: Distance walked: 50 ft Assistive device utilized: Knee Scooter Level of assistance: Modified independence Comments: Deferred formal gait assessment d/t patient has been ambulating with knee scooter since surgery.   SLS R: 17 sec  L: 4 sec  TREATMENT DATE:  [facility] Adult PT Treatment:                                                DATE: 10/12/23 Therapeutic Exercise: Standing calf stretch 3 x 30 sec on wedge Standing toe raises x 20  Stair Taps x 15 each   Therapeutic Activity: 5 min walk for warm up forward, 3 laps laterally  Step Ups x 3 (4 stairs)  Checking goals   [facility] Adult PT Treatment:                                                DATE: 09/30/2023  Therapeutic Activity:  Nustep L6 x 6 minutes (at parallel bars) Hurdle step overs (4 hurdles, 1 airex, 1 theraband pad), 3 laps forward, 3 laps laterally  Step Ups x 3 (4 stairs)   Therapeutic Exercise:  Standing calf stretch 3 x 30 sec on wedge Standing toe raises x 20  Towel scrunches x 10  Stair Taps x 15 each  Seated ankle inversion/eversion x 15 each  Updated and reviewed initial HEP   [facility] Adult PT Treatment:                                                DATE:09/23/2023   Therapeutic Activity: (at parallel bars)  Nustep L4 x 10 minutes Hurdle step overs 4 laps forward (4 hurdles + 1 airex pad in middle) + 3 laps laterally   Therapeutic Exercise:  Seated heel raise x 20 with 10lb KB BIL  STS 4 x 5 SLS 3 x 30 sec  Stair Taps x 15 each    HOME EXERCISE PROGRAM: Access Code: [email or web address] URL: [email or web address] Date: 09/14/2023 Prepared by: Marko Molt  Exercises - Supine Ankle Pumps  - 2 x daily - 7 x weekly - 2 sets - 10 reps - Ankle Inversion Eversion Towel Slide  - 2 x daily - 7 x weekly - 2 sets - 10 reps - Seated Toe Towel Scrunches  - 1 x daily - 7 x weekly - 2-3 sets - 10 reps - 3 sec hold - Seated Ankle Circles  - 2 x daily - 7 x weekly - 2 sets - 10 reps - Long Sitting Calf Stretch with Strap  - 1 x daily - 7 x weekly - 3 sets - 15-20 sec hold  ASSESSMENT:  CLINICAL IMPRESSION: Upon goal recheck pt has made significant progress toward her goals.  She has met her DF ROM  goal and is now able to ambulate for up to 15 min.  Unfortunately, she continues to have high levels of pain which limits her walking >15 min which prevent her from doing longer shopping trips to the grocery store or walking for exercise.  She continues to have high levels of pain with stairs, although she is now able to navigate the stairs in her house.  She is still unable to perform bil heel raise d/t pain.  She will benefit from continued therapy to improve her PF strength to and pain levels to normalize gait and allow her  to complete basic chores such as bending to pick objects from floor and shopping while not limited by pain or weakness.  EVAL: Xoe  is a 38 y.o. female who was seen today for physical therapy evaluation and treatment for L ankle pain with mobility deficts, 4 weeks s/p endoscopic gastroc recession and plantar fasciotomy. She is demonstrating decreased ankle ROM and has altered gait mechanics. She requires skilled PT services at this time to address relevant deficits, including ankle stability, balance and gait mechanics, and improve overall function.     OBJECTIVE IMPAIRMENTS: Abnormal gait, decreased activity tolerance, decreased balance, decreased ROM, decreased strength, increased edema, and pain.   ACTIVITY LIMITATIONS: carrying, lifting, standing, squatting, stairs, and transfers  PARTICIPATION LIMITATIONS: meal prep, cleaning, shopping, community activity, and occupation  PERSONAL FACTORS: Past/current experiences and 3+ comorbidities: DM, Fibromyalgia, HTN, Depression, Obesity are also affecting patient's functional outcome.   REHAB POTENTIAL: Fair    CLINICAL DECISION MAKING: Evolving/moderate complexity  EVALUATION COMPLEXITY: Moderate   GOALS: Goals reviewed with patient? YES  SHORT TERM GOALS: Target date: 09/22/2023   Patient will be independent with initial home program at least 3 days/week.  Baseline: provided at eval Goal Status: MET 10/07/23 Pt  reports adherence   2.  Patient will demonstrate improved L ankle DF AROM by at least 5 degrees.  Baseline: lacking 12 degrees of DF  8/12: 5 degrees AROM Goal Status: Ongoing   3.  Patient will report ambulating with BIL crutches or LRAD >75% of the day.  Baseline: Utilizing knee scooter for household and community ambulation Goal status: MET 10/07/23 Pt ambulates without AD 100% of the day.   LONG TERM GOALS: Target date: 10/02/2023 extended to 11/23/2023    Patient will report improved overall functional ability with LEFS score of 65 or greater.  Baseline: 52/80 8/12: 52/80 Goal Status: Ongoing    2.  Patient will demonstrate improved L ankle DF AROM to at least 0-5 degrees.  Baseline: lacking 12 degrees of DF 8/12: 5 degrees of DF Goal status: MET  3.  Patient will demonstrate ability to tolerate independent ambulation for at least 10 minutes.  Baseline: see gait assessment  8/12: 15 min Goal status: MET  4.  Patient will demonstrate ability to ascend and descend stair height of at least 6 x 15 with minimal-to-no pain Baseline: severe pain/difficulty 8/12: 16x with 7/10  Goal status: Ongoing   PLAN:  PT FREQUENCY: 1-2x/week  PT DURATION: 8 weeks  PLANNED INTERVENTIONS: 97164- PT Re-evaluation, 97750- Physical Performance Testing, 97110-Therapeutic exercises, 97530- Therapeutic activity, 97112- Neuromuscular re-education, 97535- Self Care, 02859- Manual therapy, [phone]- Gait training, [phone]- Aquatic Therapy, [phone]- Electrical stimulation (unattended), 97016- Vasopneumatic device, 97033- Ionotophoresis 4mg /ml Dexamethasone , Patient/Family education, Balance training, Stair training, Cryotherapy, and Moist heat   [employer] Authorization   Choose one: Rehabilitative  Standardized Assessment or Functional Outcome Tool: See Pain Assessment and LEFS  Score or Percent Disability: 52/80  Body Parts Treated (Select each separately):  Ankle. Overall deficits/functional  limitations for body part selected: moderate   If treatment provided at initial evaluation, no treatment charged due to lack of authorization.    PLAN FOR NEXT SESSION: progress as appropriate for post op status, including ankle AROM, strengthening, gait training, manual therapy and further assessment as indicated.   Raevin Wierenga E Amiracle Neises PT  10/12/2023 11:20 AM

## 2023-10-13 ENCOUNTER — Other Ambulatory Visit (HOSPITAL_COMMUNITY): Payer: Self-pay

## 2023-10-13 ENCOUNTER — Other Ambulatory Visit: Payer: Self-pay | Admitting: Podiatry

## 2023-10-13 ENCOUNTER — Other Ambulatory Visit: Payer: Self-pay

## 2023-10-13 MED ORDER — TRAMADOL HCL 50 MG PO TABS
50.0000 mg | ORAL_TABLET | Freq: Four times a day (QID) | ORAL | 0 refills | Status: DC | PRN
  Filled 2023-10-13 (×2): qty 15, 4d supply, fill #0

## 2023-10-13 NOTE — Interval H&P Note (Signed)
 H&P reviewed. The patient was examined and there are no changes to the H&P. The patient is appropriate for surgery in the ambulatory setting

## 2023-10-14 ENCOUNTER — Other Ambulatory Visit (HOSPITAL_COMMUNITY): Payer: Self-pay

## 2023-10-15 ENCOUNTER — Other Ambulatory Visit (HOSPITAL_COMMUNITY): Payer: Self-pay

## 2023-10-15 ENCOUNTER — Encounter: Admitting: Physical Therapy

## 2023-10-16 ENCOUNTER — Other Ambulatory Visit: Payer: Self-pay

## 2023-10-16 ENCOUNTER — Emergency Department (HOSPITAL_COMMUNITY): Admission: EM | Admit: 2023-10-16 | Discharge: 2023-10-16 | Disposition: A | Discharge status: Home / Self Care

## 2023-10-16 ENCOUNTER — Encounter (HOSPITAL_COMMUNITY): Payer: Self-pay | Admitting: Pharmacy Technician

## 2023-10-16 ENCOUNTER — Other Ambulatory Visit (HOSPITAL_COMMUNITY): Payer: Self-pay

## 2023-10-16 DIAGNOSIS — R112 Nausea with vomiting, unspecified: Secondary | ICD-10-CM | POA: Diagnosis present

## 2023-10-16 DIAGNOSIS — R109 Unspecified abdominal pain: Secondary | ICD-10-CM | POA: Insufficient documentation

## 2023-10-16 LAB — COMPREHENSIVE METABOLIC PANEL WITH GFR
ALT: 11 U/L (ref 0–44)
AST: 16 U/L (ref 15–41)
Albumin: 4.1 g/dL (ref 3.5–5.0)
Alkaline Phosphatase: 87 U/L (ref 38–126)
Anion gap: 11 (ref 5–15)
BUN: 15 mg/dL (ref 6–20)
CO2: 20 mmol/L — ABNORMAL LOW (ref 22–32)
Calcium: 9.7 mg/dL (ref 8.9–10.3)
Chloride: 109 mmol/L (ref 98–111)
Creatinine, Ser: 0.7 mg/dL (ref 0.44–1.00)
GFR, Estimated: >60 mL/min (ref >60)
Glucose, Bld: 108 mg/dL — ABNORMAL HIGH (ref 70–99)
Potassium: 3.6 mmol/L (ref 3.5–5.1)
Sodium: 140 mmol/L (ref 135–145)
Total Bilirubin: 0.5 mg/dL (ref 0.0–1.2)
Total Protein: 7.8 g/dL (ref 6.5–8.1)

## 2023-10-16 LAB — URINALYSIS, ROUTINE W REFLEX MICROSCOPIC
Bilirubin Urine: NEGATIVE
Glucose, UA: NEGATIVE mg/dL
Ketones, ur: NEGATIVE mg/dL
Leukocytes,Ua: NEGATIVE
Nitrite: NEGATIVE
Protein, ur: 30 mg/dL — AB
RBC / HPF: >50 RBC/hpf (ref 0–5)
Specific Gravity, Urine: 1.023 (ref 1.005–1.030)
pH: 5 (ref 5.0–8.0)

## 2023-10-16 LAB — HCG, SERUM, QUALITATIVE: Preg, Serum: NEGATIVE

## 2023-10-16 LAB — CBC
HCT: 37.6 % (ref 36.0–46.0)
Hemoglobin: 11.9 g/dL — ABNORMAL LOW (ref 12.0–15.0)
MCH: 27.1 pg (ref 26.0–34.0)
MCHC: 31.6 g/dL (ref 30.0–36.0)
MCV: 85.6 fL (ref 80.0–100.0)
Platelets: 299 K/uL (ref 150–400)
RBC: 4.39 MIL/uL (ref 3.87–5.11)
RDW: 15.4 % (ref 11.5–15.5)
WBC: 5 K/uL (ref 4.0–10.5)
nRBC: 0 % (ref 0.0–0.2)

## 2023-10-16 LAB — LIPASE, BLOOD: Lipase: 33 U/L (ref 11–51)

## 2023-10-16 MED ORDER — SODIUM CHLORIDE 0.9 % IV BOLUS
1000.0000 mL | Freq: Once | INTRAVENOUS | Status: AC
  Administered 2023-10-16: 1000 mL via INTRAVENOUS

## 2023-10-16 MED ORDER — ONDANSETRON HCL 4 MG/2ML IJ SOLN
4.0000 mg | Freq: Once | INTRAMUSCULAR | Status: AC
  Administered 2023-10-16: 4 mg via INTRAVENOUS
  Filled 2023-10-16: qty 2

## 2023-10-16 MED ORDER — ONDANSETRON HCL 4 MG PO TABS: 4.0000 mg | ORAL_TABLET | Freq: Three times a day (TID) | ORAL | Status: AC | PRN

## 2023-10-16 MED ORDER — ONDANSETRON HCL 4 MG PO TABS: 4.0000 mg | ORAL_TABLET | Freq: Three times a day (TID) | ORAL | Status: DC | PRN

## 2023-10-16 NOTE — ED Provider Notes (Signed)
  EMERGENCY DEPARTMENT AT [facility] Provider Note   CSN: [id number] Arrival date & time: 10/16/23  9282     Patient presents with: Abdominal Pain   Sherri [name] is a 38 y.o. female.   38 year old female presents for evaluation of nausea and vomiting.  States she had surgery on her wrist a few days ago and has had a lot of trouble keeping down food.  States she has had some diarrhea as well.  Denies any other symptoms or concerns.   Abdominal Pain Associated symptoms: nausea and vomiting   Associated symptoms: no chest pain, no chills, no cough, no dysuria, no fever, no hematuria, no shortness of breath and no sore throat        Prior to Admission medications   Medication Sig Start Date End Date Taking? Authorizing Provider  amLODipine (NORVASC) 5 MG tablet Take 1 tablet by mouth daily as needed (HBP). 08/11/22   [provider]  atenolol -chlorthalidone  (TENORETIC ) 50-25 MG tablet Take 1 tablet by mouth daily. 10/12/23     azelastine  (ASTELIN ) 0.1 % nasal spray Place 1 spray into both nostrils 2 (two) times daily. Use in each nostril as directed 05/16/23   Reddick, Johnathan B, NP  Azelastine -Fluticasone  137-50 MCG/ACT SUSP Place 1 spray into the nose 2 (two) times daily as needed. 08/12/23   Jeneal Danita Macintosh, MD  clindamycin  (CLEOCIN  T) 1 % lotion Apply to cleansed face daily in the morning. 06/30/21     cyclobenzaprine  (FLEXERIL ) 10 MG tablet Take 1 tablet (10 mg total) by mouth 3 (three) times daily as needed for muscle spasms. 07/15/23   McDonald, Juliene SAUNDERS, DPM  EPINEPHrine  (EPIPEN  2-PAK) 0.3 mg/0.3 mL IJ SOAJ injection Inject 0.3 mg into the muscle as needed for anaphylaxis. 12/31/22   Jeneal Danita Macintosh, MD  fluticasone  (FLONASE ) 50 MCG/ACT nasal spray Place 1 spray into both nostrils daily as needed for allergies or rhinitis. 04/20/23   [provider]  gabapentin  (NEURONTIN ) 100 MG capsule Take 1 capsule (100 mg total) by  mouth 3 (three) times daily. 08/03/23   Maczis, Puja Gosai, PA-C  gabapentin  (NEURONTIN ) 300 MG capsule Take 1 capsule (300 mg total) by mouth 3 (three) times daily for 7 days. 07/29/23 09/09/23  McDonald, Juliene SAUNDERS, DPM  hydrOXYzine  (ATARAX ) 25 MG tablet 25 mg at bedtime as needed (sleep). 08/11/22   [provider]  hyoscyamine  (LEVBID ) 0.375 MG 12 hr tablet Take 1 tablet (0.375 mg total) by mouth 2 (two) times daily. 09/15/23     levocetirizine (XYZAL ) 5 MG tablet Take 1 tablet (5 mg total) by mouth every evening. 08/12/23   Jeneal Danita Macintosh, MD  lidocaine  (LIDODERM ) 5 % Place 1 patch onto the skin daily. Remove & Discard patch within 12 hours or as directed by MD 07/23/23   Griselda Norris, MD  lisinopril (ZESTRIL) 10 MG tablet Take 10 mg by mouth daily as needed. 04/22/23 09/09/23  [provider]  medroxyPROGESTERone  (DEPO-PROVERA ) 150 MG/ML injection Inject 1 mL (150 mg total) into the muscle every 3 (three) months. 06/29/23   Leftwich-Kirby, Olam LABOR, CNM  methocarbamol  (ROBAXIN ) 500 MG tablet Take 1 tablet (500 mg total) by mouth 3 (three) times daily for 14 days 07/23/23     montelukast  (SINGULAIR ) 10 MG tablet Take 10 mg by mouth at bedtime as needed (allergies). 01/27/22   [provider]  ondansetron  (ZOFRAN ) 4 MG tablet Take 1 tablet (4 mg total) by mouth every 8 (eight) hours as  needed for up to 4 days for nausea or vomiting. 10/16/23 10/20/23  Gennaro Bouchard L, DO  ondansetron  (ZOFRAN ) 8 MG tablet Take 1 tablet (8 mg total) by mouth every 8 (eight) hours as needed for nausea or vomiting. 07/29/23   McDonald, Juliene SAUNDERS, DPM  pantoprazole  (PROTONIX ) 40 MG tablet Take 1 tablet (40 mg total) by mouth daily. 08/12/23   Jeneal Danita Macintosh, MD  pantoprazole  (PROTONIX ) 40 MG tablet Take 1 tablet (40 mg total) by mouth 2 (two) times daily. 09/15/23     polyethylene glycol (MIRALAX  / GLYCOLAX ) 17 g packet Take 17 g by mouth daily as needed for mild constipation. 07/13/23    Vicci Burnard SAUNDERS, PA-C  rizatriptan  (MAXALT ) 10 MG tablet Take 1 tablet (10 mg total) by mouth as needed for migraine. May repeat in 2 hours if needed.  Maximum 2 tablets in 24 hours. 06/11/23   Skeet Juliene SAUNDERS, DO  traMADol  (ULTRAM ) 50 MG tablet Take 1 tablet (50 mg total) by mouth every 6 (six) hours as needed for moderate pain (4-6). 10/13/23     tranexamic acid  (LYSTEDA ) 650 MG TABS tablet Take 2 tablets (1,300 mg total) by mouth 3 (three) times daily. Take during menses for a maximum of five days 08/23/23   Constant, Peggy, MD  valACYclovir  (VALTREX ) 1000 MG tablet Take 1,000 mg by mouth as needed (Outbreaks). 02/03/22   [provider]  Vitamin D , Ergocalciferol , (DRISDOL) 1.25 MG (50000 UNIT) CAPS capsule Take 50,000 Units by mouth every 7 (seven) days. 07/07/22   [provider]    Allergies: Augmentin  [amoxicillin -pot clavulanate], Macrobid  [nitrofurantoin ], Metformin  and related, Amoxicillin , and Cefdinir     Review of Systems  Constitutional:  Negative for chills and fever.  HENT:  Negative for ear pain and sore throat.   Eyes:  Negative for pain and visual disturbance.  Respiratory:  Negative for cough and shortness of breath.   Cardiovascular:  Negative for chest pain and palpitations.  Gastrointestinal:  Positive for abdominal pain, nausea and vomiting.  Genitourinary:  Negative for dysuria and hematuria.  Musculoskeletal:  Negative for arthralgias and back pain.  Skin:  Negative for color change and rash.  Neurological:  Negative for seizures and syncope.  All other systems reviewed and are negative.   Updated Vital Signs BP (!) 143/93 (BP Location: Left Arm)   Pulse 76   Temp 98.2 F (36.8 C) (Oral)   Resp 19   LMP 07/20/2023 (Approximate)   SpO2 100%   Physical Exam Vitals and nursing note reviewed.  Constitutional:      General: She is not in acute distress.    Appearance: She is well-developed. She is not ill-appearing.  HENT:     Head: Normocephalic  and atraumatic.  Eyes:     Conjunctiva/sclera: Conjunctivae normal.  Cardiovascular:     Rate and Rhythm: Normal rate and regular rhythm.     Heart sounds: No murmur heard. Pulmonary:     Effort: Pulmonary effort is normal. No respiratory distress.     Breath sounds: Normal breath sounds.  Abdominal:     Palpations: Abdomen is soft.     Tenderness: There is no abdominal tenderness.  Musculoskeletal:        General: No swelling.     Cervical back: Neck supple.  Skin:    General: Skin is warm and dry.     Capillary Refill: Capillary refill takes less than 2 seconds.  Neurological:     Mental Status: She is alert.  Psychiatric:        Mood and Affect: Mood normal.     (all labs ordered are listed, but only abnormal results are displayed) Labs Reviewed  COMPREHENSIVE METABOLIC PANEL WITH GFR - Abnormal; Notable for the following components:      Result Value   CO2 20 (*)    Glucose, Bld 108 (*)    All other components within normal limits  CBC - Abnormal; Notable for the following components:   Hemoglobin 11.9 (*)    All other components within normal limits  URINALYSIS, ROUTINE W REFLEX MICROSCOPIC - Abnormal; Notable for the following components:   APPearance HAZY (*)    Hgb urine dipstick LARGE (*)    Protein, ur 30 (*)    Bacteria, UA RARE (*)    All other components within normal limits  LIPASE, BLOOD  HCG, SERUM, QUALITATIVE    EKG: None  Radiology: No results found.   Procedures   Medications Ordered in the ED  sodium chloride  0.9 % bolus 1,000 mL (0 mLs Intravenous Stopped 10/16/23 0957)  ondansetron  (ZOFRAN ) injection 4 mg (4 mg Intravenous Given 10/16/23 0819)                                    Medical Decision Making Cardiac monitor interpretation: Sinus rhythm, no ectopy  Patient's lab workup reviewed by me and unremarkable.  Given fluids and Zofran  feeling much better.  I will give her prescription for Zofran .  Advised bland diet for the next 24  hours increase her fluid intake and follow-up with PCP as needed.  Advised return to the ER for any worsening symptoms which feels comfortable plan be discharged home.  Problems Addressed: Nausea and vomiting, unspecified vomiting type: acute illness or injury  Amount and/or Complexity of Data Reviewed External Data Reviewed: notes.    Details: Prior records reviewed and patient had recent wrist surgery on Monday Labs: ordered. Decision-making details documented in ED Course.    Details: Ordered and reviewed by me unremarkable  Risk OTC drugs. Prescription drug management. Drug therapy requiring intensive monitoring for toxicity.     Final diagnoses:  Nausea and vomiting, unspecified vomiting type    ED Discharge Orders          Ordered    ondansetron  (ZOFRAN ) 4 MG tablet  Every 8 hours PRN,   Status:  Discontinued        10/16/23 0939    ondansetron  (ZOFRAN ) 4 MG tablet  Every 8 hours PRN        10/16/23 0949               Everet Flagg L, DO 10/16/23 1459

## 2023-10-16 NOTE — ED Notes (Signed)
PT unable to provide urine sample at this time

## 2023-10-16 NOTE — Discharge Instructions (Addendum)
 Keep a clear liquid diet for the next 24 hours and you can use Zofran  as needed for nausea and vomiting.

## 2023-10-16 NOTE — ED Triage Notes (Signed)
 Pt here POV with complaints of not feeling well. Endorses generalized abdominal pain along with nausea and vomiting. Pt reports decreased PO intake and reports she feels dehydrated.

## 2023-10-18 ENCOUNTER — Ambulatory Visit: Admitting: Physical Therapy

## 2023-10-19 ENCOUNTER — Other Ambulatory Visit: Payer: Self-pay | Admitting: Podiatry

## 2023-10-20 ENCOUNTER — Ambulatory Visit: Admitting: Physical Therapy

## 2023-10-20 ENCOUNTER — Encounter: Payer: Self-pay | Admitting: Physical Therapy

## 2023-10-20 DIAGNOSIS — M79672 Pain in left foot: Secondary | ICD-10-CM

## 2023-10-20 DIAGNOSIS — R262 Difficulty in walking, not elsewhere classified: Secondary | ICD-10-CM

## 2023-10-20 DIAGNOSIS — M25672 Stiffness of left ankle, not elsewhere classified: Secondary | ICD-10-CM

## 2023-10-20 DIAGNOSIS — R600 Localized edema: Secondary | ICD-10-CM

## 2023-10-20 NOTE — Therapy (Signed)
 PHYSICAL THERAPY DISCHARGE SUMMARY  Visits from Start of Care: 13  Current functional level related to goals / functional outcomes: See assessment/goals   Remaining deficits: See assessment/goals   Education / Equipment: HEP and D/C plans  Patient agrees to discharge. Patient goals were partially met. Patient is being discharged due to insurance denial.   Patient Name: EZZIE SENAT MRN: [id number] DOB:04/04/1985, 38 y.o., female Today's Date: 10/20/2023   PT End of Session - 10/20/23 0956     Visit Number 13    Number of Visits 16    Date for PT Re-Evaluation 11/23/23    Authorization Type [employer]    Authorization Time Period 09/07/23-10/22/23    Authorization - Number of Visits 8    PT Start Time 0957    PT Stop Time 1035    PT Time Calculation (min) 38 min    Activity Tolerance Patient tolerated treatment well    Behavior During Therapy WFL for tasks assessed/performed         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    Urinary tract infection    Uterine fibroid    Past Surgical History:  Procedure Laterality Date   APPENDECTOMY     CESAREAN SECTION N/A 06/05/2014   Procedure: CESAREAN SECTION;  Surgeon: Lang JINNY Peel, DO;  Location: [facility];  Service: Obstetrics;  Laterality: N/A;   FOOT SURGERY     LAPAROSCOPIC APPENDECTOMY N/A 07/10/2023   Procedure: APPENDECTOMY, LAPAROSCOPIC;  Surgeon: Ann Fine, MD;  Location: [facility];  Service: General;  Laterality: N/A;   WISDOM TOOTH EXTRACTION     Patient Active Problem List   Diagnosis Date Noted   Acute appendicitis 07/10/2023   Initiation of Depo Provera  06/29/2023   Numbness and tingling of hand 05/13/2023   Moderate episode of recurrent major depressive disorder (HCC) 07/16/2022   Pre-diabetes 07/23/2021    Anxiety and depression 06/24/2021   Fibromyalgia 06/24/2021   Chronic sinusitis 12/26/2020   Sinus pressure 12/26/2020   Prediabetes 07/05/2020   Gastroesophageal reflux disease 04/26/2020   Perianal rash 03/26/2020   Chronic nausea 03/26/2020   Chronic constipation 03/09/2019   Pelvic pain in female 03/09/2019   Hypertension    Genital herpes    Uterine fibroid 12/25/2013   Obesity 12/25/2013   Bacterial vaginitis 04/27/2012    PCP: Doreene Maude JINNY, MD   REFERRING PROVIDER: Silva Juliene SAUNDERS, DPM  REFERRING DIAG: Bilateral plantar fasciitis [M72.2], 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 Patient is status post endoscopic gastroc recession and plantar fasciotomy bilateral, would like to include ROM, strengthening, stability, and tissue manipulation as needed and conditioning.  Weightbearing as tolerated, start therapy after June 23. Modalities PRN at therapist's discretion.    THERAPY DIAG:  Pain in Left Foot Difficulty in walking, not elsewhere classified Stiffness of Left ankle, not elsewhere classified Localized edema  Rationale for Evaluation and Treatment: Rehabilitation  ONSET DATE: 07/30/2023  Next MD follow up: 11/09/23 with Dr. Silva  SUBJECTIVE:  SUBJECTIVE STATEMENT: Pt reports that she continues to have foot and ankle pain.  She has been trying to walk a little more and has walked up to 16 min a few times.  EVAL: Patient presents to our clinic today approx 4 weeks s/p LEFT endoscopic gastroc recession and plantar fasciotomy. She has history of same procedure on right in 4/4 with subsequent PT at our location. She states  that she has not begun working on any exercises prior to today's evaluation. She is WBAT in boot and primarily using her knee scooter at home.    PERTINENT HISTORY: Relevant PMHx includes DM, Fibromyalgia, HTN, Depression, Obesity  Patient scheduled for L Carpal Tunnel Release procedure on 09/01/2023.  PAIN:  Are you having pain?  Yes: NPRS scale: 7/10 Pain location: L posterior ankle  Pain description: soreness Aggravating factors: worse  Relieving factors: rest  PRECAUTIONS: None  RED FLAGS: None   WEIGHT BEARING RESTRICTIONS: Yes WBAT with boot   FALLS:  Has patient fallen in last 6 months? 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 To be able to perform household cleaning.   NEXT MD VISIT: 09/09/2023  OBJECTIVE:  Note: Objective measures were completed at Evaluation unless otherwise noted.   PATIENT SURVEYS:  LEFS: 52/80  COGNITION: Overall cognitive status: Within functional limits for tasks assessed     SENSATION: Not tested  LOWER EXTREMITY ROM:  Active ROM Right eval Left eval  Hip flexion    Hip extension    Hip abduction    Hip adduction    Hip internal rotation    Hip external rotation    Knee flexion    Knee extension    Ankle dorsiflexion -12   Ankle plantarflexion 35   Ankle inversion 22   Ankle eversion 5    (Blank rows = not tested)  LOWER EXTREMITY MMT:  MMT Right eval Left eval  Hip flexion    Hip extension    Hip abduction    Hip adduction    Hip internal rotation    Hip external rotation    Knee flexion    Knee extension    Ankle dorsiflexion    Ankle plantarflexion    Ankle inversion    Ankle eversion     (Blank  rows = not tested)   FUNCTIONAL TESTS:  2 minute walk test: deferred  GAIT: Distance walked: 50 ft Assistive device utilized: Knee Scooter Level of assistance: Modified independence Comments: Deferred formal gait assessment d/t patient has been ambulating with knee scooter since surgery.   SLS R: 17 sec  L: 4 sec  TREATMENT DATE:   [facility] Adult PT Treatment:                                        DATE: 10/20/23 Therapeutic Exercise: Standing calf stretch Heel raise Updating and reviewing HEP  Therapeutic Activity: Extensive discussion of how to go about slowly increasing activity after D/C including chart to slowly increase walking times by week, updated HEP, hurt vs harm, bodies ability to adapt over time etc Checking goals for D/C   [facility] Adult PT Treatment:                                                DATE: 09/30/2023  Therapeutic Activity:  Nustep L6 x 6 minutes (at parallel bars) Hurdle step overs (4 hurdles, 1 airex, 1 theraband pad), 3 laps forward, 3 laps laterally  Step Ups x 3 (4 stairs)   Therapeutic Exercise:  Standing calf stretch 3 x 30 sec on wedge Standing toe raises x 20  Towel scrunches x 10  Stair Taps x 15 each  Seated ankle inversion/eversion x 15 each  Updated and reviewed initial HEP   [facility] Adult PT Treatment:                                                DATE:09/23/2023   Therapeutic Activity: (at parallel bars)  Nustep L4 x 10 minutes Hurdle step overs 4 laps forward (4 hurdles + 1 airex pad in middle) + 3 laps laterally   Therapeutic Exercise:  Seated heel raise x 20 with 10lb KB BIL  STS 4 x 5 SLS 3 x 30 sec  Stair Taps x 15 each    HOME EXERCISE PROGRAM: Access Code: [email or web address] URL: [email or web address] Date: 09/14/2023 Prepared by: Marko Molt  Exercises - Supine Ankle Pumps  - 2 x daily  - 7 x weekly - 2 sets - 10 reps - Ankle Inversion Eversion Towel Slide  - 2 x daily - 7 x weekly - 2 sets - 10 reps - Seated Toe Towel Scrunches  - 1 x daily - 7 x weekly - 2-3 sets - 10 reps - 3 sec hold - Seated Ankle Circles  - 2 x daily - 7 x weekly - 2 sets - 10 reps - Long Sitting Calf Stretch with Strap  - 1 x daily - 7 x weekly - 3 sets - 15-20 sec hold  ASSESSMENT:  CLINICAL IMPRESSION: D/C visit following insurance denial for additional visits.  Spent the bulk of the session on D/C planning with Merci .  She has had difficulty consistently completing HEP and is very reluctant to do any activity that causes discomfort.  Continued to encourage her to stay active and try to slowly increase her walking time and time completing therex even if she does have some discomfort.  From her reports activity does not significantly increase her baseline discomfort following the activity.  She has met her ankle ROM goal and walking goal, but her LEFS sore and pain remain about the same.  EVAL: Shylyn  is a 38 y.o. female who was seen today for physical therapy evaluation and treatment for  L ankle pain with mobility deficts, 4 weeks s/p endoscopic gastroc recession and plantar fasciotomy. She is demonstrating decreased ankle ROM and has altered gait mechanics. She requires skilled PT services at this time to address relevant deficits, including ankle stability, balance and gait mechanics, and improve overall function.     OBJECTIVE IMPAIRMENTS: Abnormal gait, decreased activity tolerance, decreased balance, decreased ROM, decreased strength, increased edema, and pain.   ACTIVITY LIMITATIONS: carrying, lifting, standing, squatting, stairs, and transfers  PARTICIPATION LIMITATIONS: meal prep, cleaning, shopping, community activity, and occupation  PERSONAL FACTORS: Past/current experiences and 3+ comorbidities: DM, Fibromyalgia, HTN, Depression, Obesity are also affecting patient's functional outcome.    REHAB POTENTIAL: Fair    CLINICAL DECISION MAKING: Evolving/moderate complexity  EVALUATION COMPLEXITY: Moderate   GOALS: Goals reviewed with patient? YES  SHORT TERM GOALS: Target date: 09/22/2023   Patient will be independent with initial home program at least 3 days/week.  Baseline: provided at eval Goal Status: MET 10/07/23 Pt reports adherence   2.  Patient will demonstrate improved L ankle DF AROM by at least 5 degrees.  Baseline: lacking 12 degrees of DF  8/12: 5 degrees AROM Goal Status: MET   3.  Patient will report ambulating with BIL crutches or LRAD >75% of the day.  Baseline: Utilizing knee scooter for household and community ambulation Goal status: MET 10/07/23 Pt ambulates without AD 100% of the day.   LONG TERM GOALS: Target date: 10/02/2023 extended to 11/23/2023    Patient will report improved overall functional ability with LEFS score of 65 or greater.  Baseline: 52/80 8/12: 52/80 8/20: 52/80 Goal Status: NOT MET    2.  Patient will demonstrate improved L ankle DF AROM to at least 0-5 degrees.  Baseline: lacking 12 degrees of DF 8/12: 5 degrees of DF Goal status: MET  3.  Patient will demonstrate ability to tolerate independent ambulation for at least 10 minutes.  Baseline: see gait assessment  8/12: 15 min Goal status: MET  4.  Patient will demonstrate ability to ascend and descend stair height of at least 6 x 15 with minimal-to-no pain Baseline: severe pain/difficulty 8/12: 16x with 7/10  Goal status: Partially MET   PLAN:  PT FREQUENCY: 1-2x/week  PT DURATION: 8 weeks  PLANNED INTERVENTIONS: 97164- PT Re-evaluation, 97750- Physical Performance Testing, 97110-Therapeutic exercises, 97530- Therapeutic activity, 97112- Neuromuscular re-education, 97535- Self Care, 02859- Manual therapy, [phone]- Gait training, [phone]- Aquatic Therapy, [phone]- Electrical stimulation (unattended), 97016- Vasopneumatic device, 97033- Ionotophoresis 4mg /ml  Dexamethasone , Patient/Family education, Balance training, Stair training, Cryotherapy, and Moist heat   [employer] Authorization   Choose one: Rehabilitative  Standardized Assessment or Functional Outcome Tool: See Pain Assessment and LEFS  Score or Percent Disability: 52/80  Body Parts Treated (Select each separately):  Ankle. Overall deficits/functional limitations for body part selected: moderate   If treatment provided at initial evaluation, no treatment charged due to lack of authorization.    PLAN FOR NEXT SESSION: progress as appropriate for post op status, including ankle AROM, strengthening, gait training, manual therapy and further assessment as indicated.   Shaneika Rossa E Cassaundra Rasch PT  10/20/2023 12:34 PM

## 2023-10-21 ENCOUNTER — Other Ambulatory Visit: Payer: Self-pay | Admitting: Podiatry

## 2023-10-21 ENCOUNTER — Other Ambulatory Visit (HOSPITAL_COMMUNITY): Payer: Self-pay

## 2023-10-21 MED ORDER — XIFAXAN 550 MG PO TABS
550.0000 mg | ORAL_TABLET | Freq: Three times a day (TID) | ORAL | 0 refills | Status: DC
  Filled 2023-10-21: qty 42, 14d supply, fill #0

## 2023-10-21 MED ORDER — ONDANSETRON HCL 8 MG PO TABS
8.0000 mg | ORAL_TABLET | Freq: Three times a day (TID) | ORAL | 0 refills | Status: DC | PRN
  Filled 2023-10-21: qty 20, 7d supply, fill #0

## 2023-10-21 MED ORDER — ONDANSETRON 4 MG PO TBDP
4.0000 mg | ORAL_TABLET | Freq: Three times a day (TID) | ORAL | 5 refills | Status: DC
  Filled 2023-10-21: qty 20, 7d supply, fill #0

## 2023-10-22 ENCOUNTER — Other Ambulatory Visit (HOSPITAL_COMMUNITY): Payer: Self-pay

## 2023-10-22 ENCOUNTER — Other Ambulatory Visit: Payer: Self-pay

## 2023-10-22 MED ORDER — ONDANSETRON 8 MG PO TBDP
8.0000 mg | ORAL_TABLET | Freq: Three times a day (TID) | ORAL | 0 refills | Status: DC | PRN
  Filled 2023-10-22: qty 20, 7d supply, fill #0

## 2023-10-25 ENCOUNTER — Ambulatory Visit (INDEPENDENT_AMBULATORY_CARE_PROVIDER_SITE_OTHER)

## 2023-10-25 ENCOUNTER — Telehealth: Admitting: Obstetrics and Gynecology

## 2023-10-25 ENCOUNTER — Other Ambulatory Visit (HOSPITAL_COMMUNITY): Payer: Self-pay

## 2023-10-25 DIAGNOSIS — J309 Allergic rhinitis, unspecified: Secondary | ICD-10-CM | POA: Diagnosis not present

## 2023-10-26 ENCOUNTER — Other Ambulatory Visit (HOSPITAL_COMMUNITY): Payer: Self-pay

## 2023-10-26 ENCOUNTER — Encounter: Admitting: Physical Therapy

## 2023-10-29 ENCOUNTER — Encounter (HOSPITAL_COMMUNITY): Payer: Self-pay

## 2023-10-29 ENCOUNTER — Other Ambulatory Visit (HOSPITAL_COMMUNITY): Payer: Self-pay

## 2023-10-29 MED ORDER — VIBERZI 75 MG PO TABS
75.0000 mg | ORAL_TABLET | Freq: Two times a day (BID) | ORAL | 5 refills | Status: AC
  Filled 2023-10-29: qty 60, 30d supply, fill #0

## 2023-11-02 ENCOUNTER — Other Ambulatory Visit: Payer: Self-pay

## 2023-11-09 ENCOUNTER — Encounter: Admitting: Podiatry

## 2023-11-09 ENCOUNTER — Telehealth: Admitting: Obstetrics and Gynecology

## 2023-11-09 ENCOUNTER — Encounter: Payer: Self-pay | Admitting: Obstetrics and Gynecology

## 2023-11-09 DIAGNOSIS — N920 Excessive and frequent menstruation with regular cycle: Secondary | ICD-10-CM | POA: Diagnosis not present

## 2023-11-09 NOTE — Progress Notes (Signed)
 GYNECOLOGY VIRTUAL VISIT ENCOUNTER NOTE  Provider location: [facility]   Patient location: Home  I connected with Sherri  [name] on 11/09/23 at  2:30 PM EDT by MyChart Video Encounter and verified that I am speaking with the correct person using two identifiers.   I discussed the limitations, risks, security and privacy concerns of performing an evaluation and management service virtually and the availability of in person appointments. I also discussed with the patient that there may be a patient responsible charge related to this service. The patient expressed understanding and agreed to proceed.   History:  Sherri  [name] is a 38 y.o. [id number] female being evaluated today for menorrhagia with regular cycle. Patient continues to experience heavy menses. She tried lysteda  and reports that it caused her stomach pain and thus discontinued it. She desires to discuss other options She denies any abnormal vaginal discharge, pelvic pain or other concerns.       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    Urinary tract infection    Uterine fibroid    Past Surgical History:  Procedure Laterality Date   APPENDECTOMY     CESAREAN SECTION N/A 06/05/2014   Procedure: CESAREAN SECTION;  Surgeon: Lang JINNY Peel, DO;  Location: [facility];  Service: Obstetrics;  Laterality: N/A;   FOOT SURGERY     LAPAROSCOPIC APPENDECTOMY N/A 07/10/2023   Procedure: APPENDECTOMY, LAPAROSCOPIC;  Surgeon: Ann Fine, MD;  Location: [facility];  Service: General;  Laterality: N/A;   WISDOM TOOTH EXTRACTION     The following portions of the patient's history were reviewed and updated as appropriate: allergies, current medications, past family history, past medical history, past social history,  past surgical history and problem list.   Health Maintenance:  Normal pap and negative HRHPV on 08/2023.   Review of Systems:  Pertinent items noted in HPI and remainder of comprehensive ROS otherwise negative.  Physical Exam:   General:  Alert, oriented and cooperative. Patient appears to be in no acute distress.  Mental Status: Normal mood and affect. Normal behavior. Normal judgment and thought content.   Respiratory: Normal respiratory effort, no problems with respiration noted  Rest of physical exam deferred due to type of encounter  Labs and Imaging No results found. However, due to the size of the patient record, not all encounters were searched. Please check Results Review for a complete set of results. No results found.     Assessment and Plan:     1. Menorrhagia with regular cycle (Primary) Discussed Mirena IUD vs Endometrial ablation as options. Risks, benefits and alternative of both options reviewed with the patient Patient understands that endometrial ablation needs a baseline endometrial biopsy Patient is very undecided at this time and will call back once she has made a decision All questions were answered       I discussed the assessment and treatment plan with the patient. The patient was provided an opportunity to ask questions and all were answered. The patient agreed with the plan and demonstrated an understanding of the instructions.   The patient was advised to call back or seek an in-person evaluation/go to the ED if the symptoms worsen or if the condition fails to improve as anticipated.  I provided 10  minutes of face-to-face time during this encounter. I also spent 5 minutes dedicated to the care of this patient including pre-visit review of records, post visit ordering of medications and appropriate tests or procedures, coordinating care and documenting this visit encounter.    Winton Felt, MD [facility] [employer]

## 2023-11-12 ENCOUNTER — Telehealth: Payer: Self-pay

## 2023-11-12 NOTE — Telephone Encounter (Signed)
 I called patient to see if she's available for surgery w/ Dr. Alger on 12/21/23 at 1 pm. Patient agreed to surgery date. Pre-op instructions and surgery details were provided by phone.

## 2023-11-16 ENCOUNTER — Ambulatory Visit (INDEPENDENT_AMBULATORY_CARE_PROVIDER_SITE_OTHER): Admitting: Podiatry

## 2023-11-16 VITALS — Ht 59.0 in | Wt 189.0 lb

## 2023-11-16 DIAGNOSIS — M722 Plantar fascial fibromatosis: Secondary | ICD-10-CM | POA: Diagnosis not present

## 2023-11-17 ENCOUNTER — Encounter: Payer: Self-pay | Admitting: Podiatry

## 2023-11-17 ENCOUNTER — Other Ambulatory Visit (HOSPITAL_COMMUNITY): Payer: Self-pay

## 2023-11-17 ENCOUNTER — Other Ambulatory Visit: Payer: Self-pay

## 2023-11-17 ENCOUNTER — Telehealth: Payer: Self-pay | Admitting: Lab

## 2023-11-17 MED ORDER — METHYLPREDNISOLONE 4 MG PO TBPK
ORAL_TABLET | ORAL | 0 refills | Status: DC
  Filled 2023-11-17: qty 21, 6d supply, fill #0

## 2023-11-17 NOTE — Progress Notes (Signed)
  Subjective:  Patient ID: Sherri  [name], female    DOB: November 19, 1985,  MRN: [id number]  Chief Complaint  Patient presents with   Post-op Follow-up    Rm 2 POV #4, DOS 07/30/23, LEFT FOOT PLANTAR FASCIA RELEASE AND CALF MUSCLE LENGTHENING. Pt states after returning to work experiencing sharp pain and burning sensation in both feet.     38 y.o. female returns for post-op check.    Review of Systems: Negative except as noted in the HPI. Denies N/V/F/Ch.   Objective:  There were no vitals filed for this visit. Body mass index is 38.17 kg/m. Constitutional Well developed. Well nourished.  Vascular Foot warm and well perfused. Capillary refill normal to all digits.  Calf is soft and supple, no posterior calf or knee pain, negative Homans' sign  Neurologic Normal speech. Oriented to person, place, and time. Epicritic sensation to light touch grossly present bilaterally.  Dermatologic Incisions are well-healed and not hypertrophic  Orthopedic: Some plantar heel pain nonspecific   Assessment:   1. Bilateral plantar fasciitis       Plan:  Patient was evaluated and treated and all questions answered.  S/p foot surgery left - Would like her to continue to work through her physical therapy plan and continue strengthening.  I prescribed her methylprednisolone  taper as well to reduce current inflammation.  Return in 2 months to reevaluate.   No follow-ups on file.

## 2023-11-17 NOTE — Telephone Encounter (Signed)
 Patient states was seen 11/16/2023 and her medication was not called into pharmacy.

## 2023-11-22 ENCOUNTER — Other Ambulatory Visit (HOSPITAL_COMMUNITY): Payer: Self-pay

## 2023-11-22 ENCOUNTER — Other Ambulatory Visit: Payer: Self-pay

## 2023-11-22 MED ORDER — ONDANSETRON 8 MG PO TBDP
8.0000 mg | ORAL_TABLET | Freq: Two times a day (BID) | ORAL | 3 refills | Status: DC | PRN
  Filled 2023-11-22: qty 20, 10d supply, fill #0
  Filled 2023-12-28: qty 20, 10d supply, fill #1
  Filled 2024-01-11 (×2): qty 20, 10d supply, fill #2
  Filled 2024-01-19: qty 20, 10d supply, fill #3

## 2023-11-22 MED ORDER — AMLODIPINE BESYLATE 10 MG PO TABS
10.0000 mg | ORAL_TABLET | Freq: Every day | ORAL | 1 refills | Status: AC
  Filled 2023-11-22: qty 90, 90d supply, fill #0
  Filled 2024-02-18: qty 90, 90d supply, fill #1

## 2023-11-22 MED ORDER — HYDROXYZINE HCL 25 MG PO TABS
25.0000 mg | ORAL_TABLET | Freq: Three times a day (TID) | ORAL | 3 refills | Status: DC | PRN
  Filled 2023-11-22: qty 102, 34d supply, fill #0

## 2023-11-23 ENCOUNTER — Other Ambulatory Visit (HOSPITAL_COMMUNITY): Payer: Self-pay

## 2023-11-23 ENCOUNTER — Other Ambulatory Visit: Payer: Self-pay

## 2023-11-25 ENCOUNTER — Ambulatory Visit (INDEPENDENT_AMBULATORY_CARE_PROVIDER_SITE_OTHER)

## 2023-11-25 ENCOUNTER — Ambulatory Visit: Admit: 2023-11-25 | Admitting: Obstetrics and Gynecology

## 2023-11-25 DIAGNOSIS — J309 Allergic rhinitis, unspecified: Secondary | ICD-10-CM

## 2023-11-25 SURGERY — SALPINGECTOMY, BILATERAL, LAPAROSCOPIC
Anesthesia: Choice

## 2023-11-30 ENCOUNTER — Other Ambulatory Visit (HOSPITAL_COMMUNITY): Payer: Self-pay

## 2023-11-30 ENCOUNTER — Other Ambulatory Visit: Payer: Self-pay

## 2023-11-30 MED ORDER — DIPHENOXYLATE-ATROPINE 2.5-0.025 MG PO TABS
1.0000 | ORAL_TABLET | Freq: Four times a day (QID) | ORAL | 0 refills | Status: DC | PRN
  Filled 2023-11-30: qty 30, 8d supply, fill #0

## 2023-12-01 ENCOUNTER — Ambulatory Visit

## 2023-12-01 ENCOUNTER — Other Ambulatory Visit (HOSPITAL_COMMUNITY)
Admission: RE | Admit: 2023-12-01 | Discharge: 2023-12-01 | Disposition: A | Discharge status: Home / Self Care | Source: Ambulatory Visit | Attending: Obstetrics and Gynecology | Admitting: Obstetrics and Gynecology

## 2023-12-01 ENCOUNTER — Encounter: Payer: Self-pay | Admitting: Obstetrics and Gynecology

## 2023-12-01 ENCOUNTER — Ambulatory Visit: Admitting: Obstetrics and Gynecology

## 2023-12-01 VITALS — BP 132/85 | HR 81 | Wt 194.0 lb

## 2023-12-01 DIAGNOSIS — N920 Excessive and frequent menstruation with regular cycle: Secondary | ICD-10-CM | POA: Insufficient documentation

## 2023-12-01 DIAGNOSIS — J309 Allergic rhinitis, unspecified: Secondary | ICD-10-CM | POA: Diagnosis not present

## 2023-12-01 NOTE — Progress Notes (Signed)
 Pt has not yet had cycle this month, LMP end of August.

## 2023-12-01 NOTE — Progress Notes (Signed)
 38 yo P2 here for endometrial biopsy in preparation for endometrial ablation. Patient is without any complaints. She reports amenorrhea since August. She denies pelvic pain or abnormal results  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    Urinary tract infection    Uterine fibroid    Past Surgical History:  Procedure Laterality Date   APPENDECTOMY     CESAREAN SECTION N/A 06/05/2014   Procedure: CESAREAN SECTION;  Surgeon: Lang JINNY Peel, DO;  Location: [facility];  Service: Obstetrics;  Laterality: N/A;   FOOT SURGERY     LAPAROSCOPIC APPENDECTOMY N/A 07/10/2023   Procedure: APPENDECTOMY, LAPAROSCOPIC;  Surgeon: Ann Fine, MD;  Location: [facility];  Service: General;  Laterality: N/A;   WISDOM TOOTH EXTRACTION     Family History  Problem Relation Age of Onset   Hypertension Mother    Hypertension Father    Breast cancer Maternal Grandmother        33   Colon cancer Neg Hx    Colon polyps Neg Hx    Kidney disease Neg Hx    Diabetes Neg Hx    Esophageal cancer Neg Hx    Gallbladder disease Neg Hx    Heart disease Neg Hx    Asthma Neg Hx    Stroke Neg Hx    Stomach cancer Neg Hx    Social History   Tobacco Use   Smoking status: Never    Passive exposure: Never   Smokeless tobacco: Never  Vaping Use   Vaping status: Never Used  Substance Use Topics   Alcohol use: No   Drug use: Never   ROS See pertinent in HPI. All other systems reviewed and non contributory Blood pressure 132/85, pulse 81, weight 194 lb (88 kg), last menstrual period 10/30/2023. GENERAL: Well-developed, well-nourished female in no acute distress.  ABDOMEN: Soft, nontender, nondistended. No organomegaly. PELVIC: Normal external female genitalia. Vagina is pink and rugated.  Normal discharge. Normal appearing  cervix. Uterus is normal in size. Chaperone present during the pelvic exam EXTREMITIES: No cyanosis, clubbing, or edema, 2+ distal pulses.  38 yo here for endometrial biopsy prior to endometrial ablation ENDOMETRIAL BIOPSY     The indications for endometrial biopsy were reviewed.   Risks of the biopsy including cramping, bleeding, infection, uterine perforation, inadequate specimen and need for additional procedures  were discussed. The patient states she understands and agrees to undergo procedure today. Consent was signed. Time out was performed. Urine HCG was negative. A sterile speculum was placed in the patient's vagina and the cervix was prepped with Betadine. A single-toothed tenaculum was placed on the anterior lip of the cervix to stabilize it. The uterine cavity was sounded to a depth of 8 cm using the uterine sound. The 3 mm pipelle was introduced into the endometrial cavity without difficulty, 2 passes were made.  A  moderate amount of tissue was  sent to pathology. The instruments were removed from the patient's vagina. Minimal bleeding from the cervix was noted. The patient tolerated the procedure well.  Routine post-procedure instructions were given to the patient. The patient will follow up in two weeks to review the results and for further management.

## 2023-12-03 ENCOUNTER — Other Ambulatory Visit (HOSPITAL_COMMUNITY): Payer: Self-pay

## 2023-12-03 ENCOUNTER — Other Ambulatory Visit: Payer: Self-pay | Admitting: Podiatry

## 2023-12-03 LAB — SURGICAL PATHOLOGY

## 2023-12-06 ENCOUNTER — Other Ambulatory Visit: Payer: Self-pay

## 2023-12-07 ENCOUNTER — Other Ambulatory Visit (HOSPITAL_COMMUNITY): Payer: Self-pay

## 2023-12-08 ENCOUNTER — Ambulatory Visit

## 2023-12-08 ENCOUNTER — Other Ambulatory Visit: Payer: Self-pay

## 2023-12-08 DIAGNOSIS — J309 Allergic rhinitis, unspecified: Secondary | ICD-10-CM | POA: Diagnosis not present

## 2023-12-10 NOTE — Progress Notes (Deleted)
 NEUROLOGY FOLLOW UP OFFICE NOTE  Shanta  A Klaus [id number]  Assessment/Plan:   ***igraine without aura, without status migrainosus, not intractable  Migraine prevention:  Emgality  *** Migraine rescue:  eletriptan 40mg  ***, Zofran  ODT 4mg  Lifestyle modification: Limit use of pain relievers to no more than 9 days out of the month to prevent risk of rebound or medication-overuse headache. Diet modification/hydration/caffeine cessation Routine exercise Sleep hygiene Consider vitamins/supplements:  magnesium  citrate 400mg  daily, riboflavin 400mg  daily, CoQ10 100mg  three times daily Keep headache diary Follow up ***     Subjective:  Analia  A Kunde is a 38 year old female with DM II and iron-deficiency anemia who follows up for migraines.  UPDATE: Rizatriptan  was on backorder, so she was prescribed eletriptan. Intensity:  severe Duration:  *** with eletriptan Frequency:  every other day Frequency of abortive medication: ibuprofen  every other day Current NSAIDS/analgesics:  none Current triptans: eletriptan 40mg  Current ergotamine:  none Current anti-emetic:  Zofran  ODT 4mg  Current muscle relaxants:  none Current Antihypertensive medications:  lisinopril, amlodipine  Current Antidepressant medications:  amitriptyline  50mg  QHS (to help sleep) Current Anticonvulsant medications:  none Current anti-CGRP:  none Current Antihistamines/Decongestants:  Flonase , Allegra , Sudafed Other therapy:  none Birth control:  none   Caffeine:  No coffee or colas Diet:  Sometimes ginger ale.  Trying to increase water  intake.  Eats fast food.   Exercise:  no Depression:  no; Anxiety:  yes Sleep hygiene:  good with amitriptyline .  Gets 6-7 hours.  Wakes up tired.  She has been told that she snores in the past.    HISTORY: Started having headaches starting around September 2023.  No prior history.  No known preceding trigger (illness, head injury, new medication) except for possibly  stress.  They are moderate but can be severe.  It is a pounding pain across the forehead.  No neck pain.  Associated with nausea, photophobia, phonophobia, osmophobia.  No associated vomiting, visual disturbance or unilateral numbness or weakness.  No preceding aura or prodrome.  Often wakes up with headache.  They often last all day.  They occur every other day.  Usually will take a pain reliever no more than twice a week.    They were frequent in September and October, requiring 2 ED/Urgent Care visits.  CT head on 11/30/2021 personally reviewed was normal.    She also has chronic abdominal pain with nausea, vomiting and constipation.  GI workup has been negative.    Past NSAIDS/analgesics:  acetaminophen , Excedrin, ibuprofen , diclofenac , naproxen  Past abortive triptans:   sumatriptan  25mg  (ineffective and makes her dizzy) Past abortive ergotamine:  none Past muscle relaxants:  Flexeril , Robaxin  Past anti-emetic:  promethazine  Past antihypertensive medications:  none Past antidepressant medications:  duloxetine , mirtazapine  Past anticonvulsant medications:  topiramate  (made her feel dizzy) Past anti-CGRP:  none Past vitamins/Herbal/Supplements:  none Past antihistamines/decongestants:  Zyrtec  Other past therapies:  none   Family history of headache:  no. No family history of cerebral aneurysms.    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 112 tablet 0   amLODipine  (NORVASC ) 10 MG tablet  Take 1 tablet (10 mg total) by mouth daily. 90 tablet 1   amLODipine  (NORVASC ) 5 MG tablet Take 1 tablet by mouth daily as needed (HBP).     atenolol -chlorthalidone  (TENORETIC ) 50-25 MG tablet Take 1 tablet by mouth daily. 90 tablet 0   azelastine  (ASTELIN ) 0.1 % nasal spray Place 1 spray into both nostrils 2 (two) times daily. Use in each nostril as directed 30 mL 1   Azelastine -Fluticasone  137-50 MCG/ACT SUSP Place 1 spray into the nose 2 (two) times daily as needed. 23 g 3   clindamycin  (CLEOCIN  T) 1 % lotion Apply to cleansed face daily in the morning. 60 mL 1   cyclobenzaprine  (FLEXERIL ) 10 MG tablet Take 1 tablet (10 mg total) by mouth 3 (three) times daily as needed for muscle spasms. 30 tablet 0   diphenoxylate -atropine  (LOMOTIL ) 2.5-0.025 MG tablet Take 1 tablet by mouth 4 (four) times daily as needed for diarrhea. 30 tablet 0   Eluxadoline  (VIBERZI ) 75 MG TABS Take 1 tablet (75 mg total) by mouth 2 (two) times daily. 60 tablet 5   EPINEPHrine  (EPIPEN  2-PAK) 0.3 mg/0.3 mL IJ SOAJ injection Inject 0.3 mg into the muscle as needed for anaphylaxis. 2 each 1   fluticasone  (FLONASE ) 50 MCG/ACT nasal spray Place 1 spray into both nostrils daily as needed for allergies or rhinitis.     gabapentin  (NEURONTIN ) 100 MG capsule Take 1 capsule (100 mg total) by mouth 3 (three) times daily. 42 capsule 0   gabapentin  (NEURONTIN ) 300 MG capsule Take 1 capsule (300 mg total) by mouth 3 (three) times daily for 7 days. 21 capsule 0   hydrOXYzine  (ATARAX ) 25 MG tablet 25 mg at bedtime as needed (sleep).     hydrOXYzine  (ATARAX ) 25 MG tablet Take 1 tablet (25 mg total) by mouth 3 (three) times daily as needed for anxiety. 102 tablet 3   ibuprofen  (ADVIL ) 600 MG tablet TAKE 1 TABLET (600 MG TOTAL) BY MOUTH EVERY 6 (SIX) HOURS AS NEEDED FOR UP TO 14 DAYS. 56 tablet 0   levocetirizine (XYZAL ) 5 MG tablet Take 1 tablet (5 mg total) by mouth every evening. 90 tablet 3   lidocaine  (LIDODERM ) 5 % Place 1 patch onto  the skin daily. Remove & Discard patch within 12 hours or as directed by MD 14 patch 0   lisinopril (ZESTRIL) 10 MG tablet Take 10 mg by mouth daily as needed.     medroxyPROGESTERone  (DEPO-PROVERA ) 150 MG/ML injection Inject 1 mL (150 mg total) into the muscle every 3 (three) months. 1 mL 3   methocarbamol  (ROBAXIN ) 500 MG tablet Take 1 tablet (500 mg total) by mouth 3 (three) times daily for 14 days 42 tablet 0   methylPREDNISolone  (MEDROL  DOSEPAK) 4 MG TBPK tablet 6 day dose pack - take as directed 21 tablet 0   montelukast  (SINGULAIR ) 10 MG tablet Take 10 mg by mouth at bedtime as needed (allergies).     ondansetron  (ZOFRAN -ODT) 8 MG disintegrating tablet Take 1 tablet (8 mg total) by mouth 2 (two) times daily as needed nausea. 20 tablet 3   pantoprazole  (PROTONIX ) 40 MG tablet Take 1 tablet (40 mg total) by mouth daily. 90 tablet 1   pantoprazole  (PROTONIX ) 40 MG tablet Take 1 tablet (40 mg total) by mouth 2 (two) times daily. 180 tablet 0   polyethylene glycol (  MIRALAX  / GLYCOLAX ) 17 g packet Take 17 g by mouth daily as needed for mild constipation.     rizatriptan  (MAXALT ) 10 MG tablet Take 1 tablet (10 mg total) by mouth as needed for migraine. May repeat in 2 hours if needed.  Maximum 2 tablets in 24 hours. 10 tablet 5   traMADol  (ULTRAM ) 50 MG tablet Take 1 tablet (50 mg total) by mouth every 6 (six) hours as needed for moderate pain (4-6). 15 tablet 0   tranexamic acid  (LYSTEDA ) 650 MG TABS tablet Take 2 tablets (1,300 mg total) by mouth 3 (three) times daily. Take during menses for a maximum of five days 30 tablet 2   valACYclovir  (VALTREX ) 1000 MG tablet Take 1,000 mg by mouth as needed (Outbreaks).     Vitamin D , Ergocalciferol , (DRISDOL) 1.25 MG (50000 UNIT) CAPS capsule Take 50,000 Units by mouth every 7 (seven) days.     No current facility-administered medications on file prior to visit.    ALLERGIES: Allergies  Allergen Reactions   Augmentin  [Amoxicillin -Pot Clavulanate]  Nausea And Vomiting   Macrobid  [Nitrofurantoin ] Nausea And Vomiting   Metformin  And Related Nausea And Vomiting   Amoxicillin  Rash   Cefdinir  Itching    FAMILY HISTORY: Family History  Problem Relation Age of Onset   Hypertension Mother    Hypertension Father    Breast cancer Maternal Grandmother        48   Colon cancer Neg Hx    Colon polyps Neg Hx    Kidney disease Neg Hx    Diabetes Neg Hx    Esophageal cancer Neg Hx    Gallbladder disease Neg Hx    Heart disease Neg Hx    Asthma Neg Hx    Stroke Neg Hx    Stomach cancer Neg Hx       Objective:  *** General: No acute distress.  Patient appears well-groomed.   ***   Juliene Dunnings, DO  CC: Maude Nephew, MD

## 2023-12-13 ENCOUNTER — Encounter (HOSPITAL_COMMUNITY): Payer: Self-pay

## 2023-12-13 ENCOUNTER — Encounter (HOSPITAL_COMMUNITY): Payer: Self-pay | Admitting: Obstetrics and Gynecology

## 2023-12-13 NOTE — Progress Notes (Addendum)
 Spoke w/ via phone for pre-op interview--- Sherri [name]  Lab needs dos---- BMP and UPT per anesthesia. Surgeon orders requested 12/13/23.        Lab results------Current EKG in Epic date 07/22/23. COVID test -----patient states asymptomatic no test needed Arrive at -------1115 NPO after MN NO Solid Food.  Clear liquids from MN until---1015 Pre-Surgery Ensure or G2:  Med rec completed Medications to take morning of surgery -----Norvasc , Gabapentin , Protonix  and Maxalt -PRN. Diabetic medication -----  GLP1 agonist last dose: GLP1 instructions:  Patient instructed no nail polish to be worn day of surgery Patient instructed to bring photo id and insurance card day of surgery Patient aware to have Driver (ride ) / caregiver    for 24 hours after surgery - Father Sherri [name] Patient Special Instructions ----- Pre-Op special Instructions -----  Patient verbalized understanding of instructions that were given at this phone interview. Patient denies chest pain, sob, fever, cough at the interview.

## 2023-12-14 ENCOUNTER — Ambulatory Visit: Admitting: Neurology

## 2023-12-14 ENCOUNTER — Emergency Department (HOSPITAL_COMMUNITY)
Admission: EM | Admit: 2023-12-14 | Discharge: 2023-12-14 | Disposition: A | Discharge status: Home / Self Care | Attending: Emergency Medicine | Admitting: Emergency Medicine

## 2023-12-14 ENCOUNTER — Encounter: Payer: Self-pay | Admitting: Neurology

## 2023-12-14 ENCOUNTER — Encounter (HOSPITAL_COMMUNITY): Payer: Self-pay

## 2023-12-14 ENCOUNTER — Other Ambulatory Visit: Payer: Self-pay

## 2023-12-14 DIAGNOSIS — B349 Viral infection, unspecified: Secondary | ICD-10-CM | POA: Diagnosis not present

## 2023-12-14 DIAGNOSIS — Z79899 Other long term (current) drug therapy: Secondary | ICD-10-CM | POA: Insufficient documentation

## 2023-12-14 DIAGNOSIS — R0981 Nasal congestion: Secondary | ICD-10-CM | POA: Diagnosis present

## 2023-12-14 LAB — RESP PANEL BY RT-PCR (RSV, FLU A&B, COVID)  RVPGX2
Influenza A by PCR: NEGATIVE
Influenza B by PCR: NEGATIVE
Resp Syncytial Virus by PCR: NEGATIVE
SARS Coronavirus 2 by RT PCR: NEGATIVE

## 2023-12-14 MED ORDER — IBUPROFEN 200 MG PO TABS
600.0000 mg | ORAL_TABLET | Freq: Once | ORAL | Status: AC
  Administered 2023-12-14: 600 mg via ORAL
  Filled 2023-12-14: qty 3

## 2023-12-14 MED ORDER — ONDANSETRON 8 MG PO TBDP
8.0000 mg | ORAL_TABLET | Freq: Once | ORAL | Status: AC
  Administered 2023-12-14: 8 mg via ORAL
  Filled 2023-12-14: qty 1

## 2023-12-14 NOTE — Discharge Instructions (Signed)
 Evaluation today was overall reassuring.  Suspect as we discussed this is likely a viral illness.  Recommend supportive treatment at home which includes rest, her assertive hydration with water  and Gatorade and Tylenol  and ibuprofen  as needed.  Please follow-up on MyChart for the results of your respiratory panel.  Treatment will still be the same.  If you develop chest pain, shortness of breath abdominal pain, or any other concerning symptom please return to the ED for further evaluation.

## 2023-12-14 NOTE — ED Triage Notes (Signed)
 Pt presents via POV c/o generalized body aches, congestion, nose running, allergies, and generally not feeling well. Reports some nausea.   Ambulatory to triage. A&O x4.

## 2023-12-14 NOTE — ED Provider Notes (Signed)
 [place] [facility] AT [facility] Provider Note   CSN: [id number] Arrival date & time: 12/14/23  2048     Patient presents with: Nasal Congestion  HPI Sherri [name] is a 38 y.o. female presenting with generalized bodyaches, nasal congestion, nausea and generally not feeling well.  She stated this started about 2 days ago.  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  Denies abdominal pain, vomiting and diarrhea at this time.  Denies chest pain or shortness of breath.  Endorses chills at home but unsure if she has had a fever.   HPI     Prior to Admission medications   Medication Sig Start Date End Date Taking? Authorizing Provider  Acetaminophen  Extra Strength 500 MG TABS TAKE 2 TABLETS (1,000 MG TOTAL) BY MOUTH EVERY 6 (SIX) HOURS AS NEEDED FOR UP TO 14 DAYS (PAIN). 12/06/23   McDonald, Juliene SAUNDERS, DPM  amLODipine  (NORVASC ) 10 MG tablet Take 1 tablet (10 mg total) by mouth daily. 11/22/23     amLODipine  (NORVASC ) 5 MG tablet Take 1 tablet by mouth daily as needed (HBP). 08/11/22   [provider]  atenolol -chlorthalidone  (TENORETIC ) 50-25 MG tablet Take 1 tablet by mouth daily. 10/12/23     azelastine  (ASTELIN ) 0.1 % nasal spray Place 1 spray into both nostrils 2 (two) times daily. Use in each nostril as directed 05/16/23   Reddick, Johnathan B, NP  Azelastine -Fluticasone  137-50 MCG/ACT SUSP Place 1 spray into the nose 2 (two) times daily as needed. 08/12/23   Jeneal Danita Macintosh, MD  clindamycin  (CLEOCIN  T) 1 % lotion Apply to cleansed face daily in the morning. 06/30/21     cyclobenzaprine  (FLEXERIL ) 10 MG tablet Take 1 tablet (10 mg total) by mouth 3 (three) times daily as needed for muscle spasms. 07/15/23   McDonald, Juliene SAUNDERS, DPM  diphenoxylate -atropine  (LOMOTIL ) 2.5-0.025 MG tablet Take 1 tablet by mouth 4 (four) times daily as needed for diarrhea. 11/30/23     Eluxadoline  (VIBERZI ) 75 MG  TABS Take 1 tablet (75 mg total) by mouth 2 (two) times daily. 10/29/23     EPINEPHrine  (EPIPEN  2-PAK) 0.3 mg/0.3 mL IJ SOAJ injection Inject 0.3 mg into the muscle as needed for anaphylaxis. 12/31/22   Jeneal Danita Macintosh, MD  fluticasone  (FLONASE ) 50 MCG/ACT nasal spray Place 1 spray into both nostrils daily as needed for allergies or rhinitis. 04/20/23   [provider]  gabapentin  (NEURONTIN ) 100 MG capsule Take 1 capsule (100 mg total) by mouth 3 (three) times daily. 08/03/23   Maczis, Puja Gosai, PA-C  gabapentin  (NEURONTIN ) 300 MG capsule Take 1 capsule (300 mg total) by mouth 3 (three) times daily for 7 days. 07/29/23 09/09/23  McDonald, Juliene SAUNDERS, DPM  hydrOXYzine  (ATARAX ) 25 MG tablet 25 mg at bedtime as needed (sleep). 08/11/22   [provider]  hydrOXYzine  (ATARAX ) 25 MG tablet Take 1 tablet (25 mg total) by mouth 3 (three) times daily as needed for anxiety. 11/22/23     ibuprofen  (ADVIL ) 600 MG tablet TAKE 1 TABLET (600 MG TOTAL) BY MOUTH EVERY 6 (SIX) HOURS AS NEEDED FOR UP TO 14 DAYS. 12/06/23 12/20/23  Silva Juliene SAUNDERS, DPM  levocetirizine (XYZAL ) 5 MG tablet Take 1 tablet (5 mg total) by mouth every evening. 08/12/23   Jeneal Danita Macintosh, MD  lidocaine  (LIDODERM ) 5 % Place 1 patch onto the skin daily. Remove & Discard patch within 12 hours  or as directed by MD 07/23/23   Griselda Norris, MD  lisinopril (ZESTRIL) 10 MG tablet Take 10 mg by mouth daily as needed. 04/22/23 12/13/23  [provider]  medroxyPROGESTERone  (DEPO-PROVERA ) 150 MG/ML injection Inject 1 mL (150 mg total) into the muscle every 3 (three) months. Patient not taking: Reported on 12/13/2023 06/29/23   Leftwich-Kirby, Olam LABOR, CNM  methocarbamol  (ROBAXIN ) 500 MG tablet Take 1 tablet (500 mg total) by mouth 3 (three) times daily for 14 days 07/23/23     methylPREDNISolone  (MEDROL  DOSEPAK) 4 MG TBPK tablet 6 day dose pack - take as directed 11/17/23   Silva Juliene SAUNDERS, DPM  montelukast  (SINGULAIR )  10 MG tablet Take 10 mg by mouth at bedtime as needed (allergies). 01/27/22   [provider]  ondansetron  (ZOFRAN -ODT) 8 MG disintegrating tablet Take 1 tablet (8 mg total) by mouth 2 (two) times daily as needed nausea. 11/22/23     pantoprazole  (PROTONIX ) 40 MG tablet Take 1 tablet (40 mg total) by mouth daily. 08/12/23   Jeneal Danita Macintosh, MD  pantoprazole  (PROTONIX ) 40 MG tablet Take 1 tablet (40 mg total) by mouth 2 (two) times daily. 09/15/23     polyethylene glycol (MIRALAX  / GLYCOLAX ) 17 g packet Take 17 g by mouth daily as needed for mild constipation. 07/13/23   Vicci Burnard SAUNDERS, PA-C  rizatriptan  (MAXALT ) 10 MG tablet Take 1 tablet (10 mg total) by mouth as needed for migraine. May repeat in 2 hours if needed.  Maximum 2 tablets in 24 hours. 06/11/23   Skeet Juliene SAUNDERS, DO  traMADol  (ULTRAM ) 50 MG tablet Take 1 tablet (50 mg total) by mouth every 6 (six) hours as needed for moderate pain (4-6). 10/13/23     tranexamic acid  (LYSTEDA ) 650 MG TABS tablet Take 2 tablets (1,300 mg total) by mouth 3 (three) times daily. Take during menses for a maximum of five days 08/23/23   Constant, Peggy, MD  valACYclovir  (VALTREX ) 1000 MG tablet Take 1,000 mg by mouth as needed (Outbreaks). 02/03/22   [provider]  Vitamin D , Ergocalciferol , (DRISDOL) 1.25 MG (50000 UNIT) CAPS capsule Take 50,000 Units by mouth every 7 (seven) days. Patient taking differently: Take 50,000 Units by mouth every 7 (seven) days. daily 07/07/22   [provider]    Allergies: Augmentin  [amoxicillin -pot clavulanate], Macrobid  [nitrofurantoin ], Metformin  and related, Amoxicillin , and Cefdinir     Review of Systems See HPI  Updated Vital Signs BP (!) 155/108   Pulse 81   Temp 99.1 F (37.3 C) (Oral)   Resp 16   LMP  (LMP Unknown)   SpO2 99%   Physical Exam Vitals and nursing note reviewed.  HENT:     Head: Normocephalic and atraumatic.     Mouth/Throat:     Mouth: Mucous membranes are moist.   Eyes:     General:        Right eye: No discharge.        Left eye: No discharge.     Conjunctiva/sclera: Conjunctivae normal.  Cardiovascular:     Rate and Rhythm: Normal rate and regular rhythm.     Pulses: Normal pulses.     Heart sounds: Normal heart sounds.  Pulmonary:     Effort: Pulmonary effort is normal.     Breath sounds: Normal breath sounds and air entry.  Abdominal:     General: Abdomen is flat.     Palpations: Abdomen is soft.     Tenderness: There is no abdominal tenderness.  Skin:    General: Skin is warm and dry.  Neurological:     General: No focal deficit present.  Psychiatric:        Mood and Affect: Mood normal.     (all labs ordered are listed, but only abnormal results are displayed) Labs Reviewed  RESP PANEL BY RT-PCR (RSV, FLU A&B, COVID)  [id number]    EKG: None  Radiology: No results found.   Procedures   Medications Ordered in the ED  ondansetron  (ZOFRAN -ODT) disintegrating tablet 8 mg (has no administration in time range)  ibuprofen  (ADVIL ) tablet 600 mg (has no administration in time range)                                    Medical Decision Making  38 year old well-appearing female presenting for nasal congestion, cough, malaise and nausea.  Overall exam is unremarkable.  She looks well and nontoxic.  Vitals here are reassuring and she is afebrile.  Suspect this is likely a viral process.  Recommended supportive treatment and intermittent use of Tylenol  and ibuprofen  as needed along with assertive hydration at home.  Sent some Zofran  to her pharmacy to help with nausea.  Advised her to follow-up with her PCP.  Discussed return precautions.  Discharged.  Advised her to follow-up on respiratory PCR on MyChart for results.      Final diagnoses:  Viral illness    ED Discharge Orders     None          Lang Norleen POUR, PA-C 12/14/23 2151    Lenor Hollering, MD 12/14/23 2340

## 2023-12-16 ENCOUNTER — Ambulatory Visit (HOSPITAL_COMMUNITY)
Admission: EM | Admit: 2023-12-16 | Discharge: 2023-12-16 | Disposition: A | Discharge status: Home / Self Care | Attending: Internal Medicine | Admitting: Internal Medicine

## 2023-12-16 ENCOUNTER — Other Ambulatory Visit: Payer: Self-pay

## 2023-12-16 ENCOUNTER — Encounter (HOSPITAL_COMMUNITY): Payer: Self-pay | Admitting: Emergency Medicine

## 2023-12-16 DIAGNOSIS — J069 Acute upper respiratory infection, unspecified: Secondary | ICD-10-CM | POA: Diagnosis not present

## 2023-12-16 DIAGNOSIS — R040 Epistaxis: Secondary | ICD-10-CM

## 2023-12-16 MED ORDER — FLUTICASONE PROPIONATE 50 MCG/ACT NA SUSP
2.0000 | Freq: Every day | NASAL | 0 refills | Status: AC
  Filled 2023-12-17 – 2023-12-20 (×3): qty 16, 30d supply, fill #0

## 2023-12-16 MED ORDER — OXYMETAZOLINE HCL 0.05 % NA SOLN
1.0000 | Freq: Two times a day (BID) | NASAL | 0 refills | Status: DC
  Filled 2023-12-17: qty 30, 150d supply, fill #0

## 2023-12-16 NOTE — ED Triage Notes (Signed)
 Patient has had stuffy nose, stuffy ears, and sore throat.  Patient has noticed blood in secretions she blows from nose.  Patient reports these symptoms for a week.  Patient also attests she has had a nose bleed as well.  Patient has used allergy  pill, tylenol , ibuprofen , nasal spray  Patient was seen on 12/14/2023 at [facility].  Tested for covid and flu-negative for both.

## 2023-12-16 NOTE — Discharge Instructions (Addendum)
 You have a viral illness which will improve on its own with rest, fluids, and medications to help with your symptoms.  Tylenol , guaifenesin  (plain mucinex ), and saline nasal sprays may help relieve symptoms.   Two teaspoons of honey in 1 cup of warm water  every 4-6 hours may help with throat pains.  Humidifier in room at nighttime may help soothe cough (clean well daily).   For your nosebleeds, use Afrin 1 to 2 sprays into what ever nostril is bleeding if you are experiencing an active nosebleed. Use 2 puffs of Flonase  daily.  Use saline nasal sprays as needed.  Do not use Afrin long-term as this can cause rebound nasal congestion.   For chest pain, shortness of breath, inability to keep food or fluids down without vomiting, fever that does not respond to tylenol  or motrin , or any other severe symptoms, please go to the ER for further evaluation. Return to urgent care as needed, otherwise follow-up with PCP.

## 2023-12-16 NOTE — ED Notes (Signed)
 Declined a work note

## 2023-12-17 ENCOUNTER — Other Ambulatory Visit: Payer: Self-pay

## 2023-12-17 ENCOUNTER — Other Ambulatory Visit (HOSPITAL_COMMUNITY): Payer: Self-pay

## 2023-12-17 NOTE — ED Provider Notes (Signed)
 [facility]    CSN: [id number] Arrival date & time: 12/16/23  1437      History   Chief Complaint Chief Complaint  Patient presents with   Epistaxis   URI    HPI Sherri [name] is a 38 y.o. female.   Sherri [name] is a 38 y.o. female presenting for chief complaint of epistaxis and URI. Presents with 5 day history of cough, nasal congestion, generalized fatigue with 2 day history of nose bleeds. She was seen in the ER for symptoms where she tested negative via PCR testing for COVID-19, flu, and RSV. She had a brisk nosebleed from the right nostril yesterday while at work which she was able to control with pressure. She is still seeing a small amount of blood streaked in the mucous when she blows her nose and this is causing concern. She does not take blood thinning medications and denies recent trauma/injuries to the nose. She does not have a history of frequent nose bleeds. History of seasonal allergies, she uses Flonase  PRN, not daily. She has been taking tylenol , ibuprofen , and using nasal sprays PRN without much relief of nasal pressure. She has not had an active nosebleed since yesterday.    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    Urinary tract infection    Uterine fibroid     Patient Active Problem List   Diagnosis Date Noted   Acute appendicitis 07/10/2023   Initiation of Depo Provera  06/29/2023   Numbness and tingling of hand 05/13/2023   Moderate episode of recurrent major depressive disorder (HCC) 07/16/2022   Pre-diabetes 07/23/2021   Anxiety and depression 06/24/2021   Fibromyalgia 06/24/2021   Chronic sinusitis 12/26/2020   Sinus pressure 12/26/2020   Prediabetes 07/05/2020   Gastroesophageal reflux disease 04/26/2020   Perianal rash  03/26/2020   Chronic nausea 03/26/2020   Chronic constipation 03/09/2019   Pelvic pain in female 03/09/2019   Hypertension    Genital herpes    Uterine fibroid 12/25/2013   Obesity 12/25/2013   Bacterial vaginitis 04/27/2012    Past Surgical History:  Procedure Laterality Date   APPENDECTOMY     CESAREAN SECTION N/A 06/05/2014   Procedure: CESAREAN SECTION;  Surgeon: Lang JINNY Peel, DO;  Location: [facility];  Service: Obstetrics;  Laterality: N/A;   FOOT SURGERY     LAPAROSCOPIC APPENDECTOMY N/A 07/10/2023   Procedure: APPENDECTOMY, LAPAROSCOPIC;  Surgeon: Ann Fine, MD;  Location: [facility];  Service: General;  Laterality: N/A;   WISDOM TOOTH EXTRACTION      OB History     Gravida  1   Para  1   Term      Preterm  1   AB      Living  2      SAB      IAB      Ectopic      Multiple  1   Live Births  2            Home Medications    Prior to Admission medications   Medication Sig Start Date End Date Taking? Authorizing Provider  atenolol -chlorthalidone  (TENORETIC ) 50-25 MG tablet Take 1 tablet by mouth daily. 10/12/23  Yes   fluticasone  (FLONASE ) 50 MCG/ACT nasal spray  Place 2 sprays into both nostrils daily. 12/16/23  Yes Enedelia Dorna HERO, FNP  levocetirizine (XYZAL ) 5 MG tablet Take 1 tablet (5 mg total) by mouth every evening. 08/12/23  Yes Padgett, Danita Macintosh, MD  oxymetazoline  [facility] NASAL SPRAY) 0.05 % nasal spray Place 1 spray into both nostrils 2 (two) times daily. 12/16/23  Yes Makella Buckingham, Dorna HERO, FNP  Acetaminophen  Extra Strength 500 MG TABS TAKE 2 TABLETS (1,000 MG TOTAL) BY MOUTH EVERY 6 (SIX) HOURS AS NEEDED FOR UP TO 14 DAYS (PAIN). 12/06/23   McDonald, Juliene SAUNDERS, DPM  amLODipine  (NORVASC ) 10 MG tablet Take 1 tablet (10 mg total) by mouth daily. 11/22/23     amLODipine  (NORVASC ) 5 MG tablet Take 1 tablet by mouth daily as needed (HBP). 08/11/22   [provider]  azelastine  (ASTELIN ) 0.1 % nasal spray Place 1 spray into  both nostrils 2 (two) times daily. Use in each nostril as directed 05/16/23   Reddick, Johnathan B, NP  Azelastine -Fluticasone  137-50 MCG/ACT SUSP Place 1 spray into the nose 2 (two) times daily as needed. 08/12/23   Jeneal Danita Macintosh, MD  clindamycin  (CLEOCIN  T) 1 % lotion Apply to cleansed face daily in the morning. 06/30/21     cyclobenzaprine  (FLEXERIL ) 10 MG tablet Take 1 tablet (10 mg total) by mouth 3 (three) times daily as needed for muscle spasms. 07/15/23   McDonald, Juliene SAUNDERS, DPM  diphenoxylate -atropine  (LOMOTIL ) 2.5-0.025 MG tablet Take 1 tablet by mouth 4 (four) times daily as needed for diarrhea. 11/30/23     Eluxadoline  (VIBERZI ) 75 MG TABS Take 1 tablet (75 mg total) by mouth 2 (two) times daily. 10/29/23     EPINEPHrine  (EPIPEN  2-PAK) 0.3 mg/0.3 mL IJ SOAJ injection Inject 0.3 mg into the muscle as needed for anaphylaxis. 12/31/22   Jeneal Danita Macintosh, MD  gabapentin  (NEURONTIN ) 100 MG capsule Take 1 capsule (100 mg total) by mouth 3 (three) times daily. 08/03/23   Maczis, Puja Gosai, PA-C  gabapentin  (NEURONTIN ) 300 MG capsule Take 1 capsule (300 mg total) by mouth 3 (three) times daily for 7 days. 07/29/23 09/09/23  McDonald, Juliene SAUNDERS, DPM  hydrOXYzine  (ATARAX ) 25 MG tablet 25 mg at bedtime as needed (sleep). 08/11/22   [provider]  hydrOXYzine  (ATARAX ) 25 MG tablet Take 1 tablet (25 mg total) by mouth 3 (three) times daily as needed for anxiety. 11/22/23     ibuprofen  (ADVIL ) 600 MG tablet TAKE 1 TABLET (600 MG TOTAL) BY MOUTH EVERY 6 (SIX) HOURS AS NEEDED FOR UP TO 14 DAYS. 12/06/23 12/20/23  McDonald, Juliene SAUNDERS, DPM  lidocaine  (LIDODERM ) 5 % Place 1 patch onto the skin daily. Remove & Discard patch within 12 hours or as directed by MD 07/23/23   Griselda Norris, MD  lisinopril (ZESTRIL) 10 MG tablet Take 10 mg by mouth daily as needed. 04/22/23 12/13/23  [provider]  medroxyPROGESTERone  (DEPO-PROVERA ) 150 MG/ML injection Inject 1 mL (150 mg total) into the muscle  every 3 (three) months. Patient not taking: Reported on 12/13/2023 06/29/23   Leftwich-Kirby, Olam LABOR, CNM  methocarbamol  (ROBAXIN ) 500 MG tablet Take 1 tablet (500 mg total) by mouth 3 (three) times daily for 14 days 07/23/23     methylPREDNISolone  (MEDROL  DOSEPAK) 4 MG TBPK tablet 6 day dose pack - take as directed 11/17/23   Silva Juliene SAUNDERS, DPM  montelukast  (SINGULAIR ) 10 MG tablet Take 10 mg by mouth at bedtime as needed (allergies). 01/27/22   [provider]  ondansetron  (ZOFRAN -ODT) 8  MG disintegrating tablet Take 1 tablet (8 mg total) by mouth 2 (two) times daily as needed nausea. 11/22/23     pantoprazole  (PROTONIX ) 40 MG tablet Take 1 tablet (40 mg total) by mouth daily. 08/12/23   Jeneal Danita Macintosh, MD  pantoprazole  (PROTONIX ) 40 MG tablet Take 1 tablet (40 mg total) by mouth 2 (two) times daily. 09/15/23     polyethylene glycol (MIRALAX  / GLYCOLAX ) 17 g packet Take 17 g by mouth daily as needed for mild constipation. 07/13/23   Vicci Burnard SAUNDERS, PA-C  rizatriptan  (MAXALT ) 10 MG tablet Take 1 tablet (10 mg total) by mouth as needed for migraine. May repeat in 2 hours if needed.  Maximum 2 tablets in 24 hours. 06/11/23   Skeet Juliene SAUNDERS, DO  traMADol  (ULTRAM ) 50 MG tablet Take 1 tablet (50 mg total) by mouth every 6 (six) hours as needed for moderate pain (4-6). 10/13/23     tranexamic acid  (LYSTEDA ) 650 MG TABS tablet Take 2 tablets (1,300 mg total) by mouth 3 (three) times daily. Take during menses for a maximum of five days 08/23/23   Constant, Peggy, MD  valACYclovir  (VALTREX ) 1000 MG tablet Take 1,000 mg by mouth as needed (Outbreaks). 02/03/22   [provider]  Vitamin D , Ergocalciferol , (DRISDOL) 1.25 MG (50000 UNIT) CAPS capsule Take 50,000 Units by mouth every 7 (seven) days. Patient taking differently: Take 50,000 Units by mouth every 7 (seven) days. daily 07/07/22   [provider]    Family History Family History  Problem Relation Age of Onset    Hypertension Mother    Hypertension Father    Breast cancer Maternal Grandmother        74   Colon cancer Neg Hx    Colon polyps Neg Hx    Kidney disease Neg Hx    Diabetes Neg Hx    Esophageal cancer Neg Hx    Gallbladder disease Neg Hx    Heart disease Neg Hx    Asthma Neg Hx    Stroke Neg Hx    Stomach cancer Neg Hx     Social History Social History   Tobacco Use   Smoking status: Never    Passive exposure: Never   Smokeless tobacco: Never  Vaping Use   Vaping status: Never Used  Substance Use Topics   Alcohol use: No   Drug use: Never     Allergies   Augmentin  [amoxicillin -pot clavulanate], Macrobid  [nitrofurantoin ], Metformin  and related, Amoxicillin , and Cefdinir    Review of Systems Review of Systems  HENT:  Positive for nosebleeds.   Per HPI   Physical Exam Triage Vital Signs ED Triage Vitals  Encounter Vitals Group     BP 12/16/23 1609 (!) 152/99     Girls Systolic BP Percentile --      Girls Diastolic BP Percentile --      Boys Systolic BP Percentile --      Boys Diastolic BP Percentile --      Pulse Rate 12/16/23 1609 97     Resp 12/16/23 1609 18     Temp 12/16/23 1609 98.5 F (36.9 C)     Temp Source 12/16/23 1609 Oral     SpO2 12/16/23 1609 99 %     Weight --      Height --      Head Circumference --      Peak Flow --      Pain Score 12/16/23 1605 8     Pain Loc --  Pain Education --      Exclude from Growth Chart --    No data found.  Updated Vital Signs BP (!) 152/99 (BP Location: Right Arm) Comment (BP Location): large cuff  Pulse 97   Temp 98.5 F (36.9 C) (Oral)   Resp 18   LMP 12/12/2023   SpO2 99%   Visual Acuity Right Eye Distance:   Left Eye Distance:   Bilateral Distance:    Right Eye Near:   Left Eye Near:    Bilateral Near:     Physical Exam Vitals and nursing note reviewed.  Constitutional:      Appearance: She is not ill-appearing or toxic-appearing.  HENT:     Head: Normocephalic and atraumatic.      Right Ear: Hearing, tympanic membrane, ear canal and external ear normal.     Left Ear: Hearing, tympanic membrane, ear canal and external ear normal.     Nose: Congestion present.     Comments: Right nare kiesselbach's plexus with evidence of crusty dried blood, no active bleeding. Both turbinates swollen and erythematous without active epistaxis.     Mouth/Throat:     Lips: Pink.     Mouth: Mucous membranes are moist. No injury or oral lesions.     Dentition: Normal dentition.     Tongue: No lesions.     Pharynx: Oropharynx is clear. Uvula midline. No pharyngeal swelling, oropharyngeal exudate, posterior oropharyngeal erythema, uvula swelling or postnasal drip.     Tonsils: No tonsillar exudate.  Eyes:     General: Lids are normal. Vision grossly intact. Gaze aligned appropriately.     Extraocular Movements: Extraocular movements intact.     Conjunctiva/sclera: Conjunctivae normal.  Neck:     Trachea: Trachea and phonation normal.  Cardiovascular:     Rate and Rhythm: Normal rate and regular rhythm.     Heart sounds: Normal heart sounds, S1 normal and S2 normal.  Pulmonary:     Effort: Pulmonary effort is normal. No respiratory distress.     Breath sounds: Normal breath sounds and air entry. No wheezing, rhonchi or rales.  Chest:     Chest wall: No tenderness.  Musculoskeletal:     Cervical back: Neck supple.  Lymphadenopathy:     Cervical: No cervical adenopathy.  Skin:    General: Skin is warm and dry.     Capillary Refill: Capillary refill takes less than 2 seconds.     Findings: No rash.  Neurological:     General: No focal deficit present.     Mental Status: She is alert and oriented to person, place, and time. Mental status is at baseline.     Cranial Nerves: No dysarthria or facial asymmetry.  Psychiatric:        Mood and Affect: Mood normal.        Speech: Speech normal.        Behavior: Behavior normal.        Thought Content: Thought content normal.         Judgment: Judgment normal.      UC Treatments / Results  Labs (all labs ordered are listed, but only abnormal results are displayed) Labs Reviewed - No data to display  EKG   Radiology No results found.  Procedures Procedures (including critical care time)  Medications Ordered in UC Medications - No data to display  Initial Impression / Assessment and Plan / UC Course  I have reviewed the triage vital signs and the nursing notes.  Pertinent labs & imaging results that were available during my care of the patient were reviewed by me and considered in my medical decision making (see chart for details).   1. Viral URI with cough, epistaxis Suspect viral URI, viral syndrome.  Reviewed ER workup from yesterday showing negative COVID-19, flu, and RSV testing.   Physical exam findings reassuring, vital signs hemodynamically stable, and lungs clear, therefore deferred imaging of the chest.  Advised supportive care/prescriptions for symptomatic relief as outlined in AVS.    No active epistaxis at this time.  She is to use saline nasal sprays PRN for congestion, Flonase  2 puffs daily into each nostil for 2 weeks, and afrin nasal spray only if she is experiencing active nosebleed. Discussed appropriate use of afrin, advised to only use for active nosebleeds and avoid consistent use for longer than 3 days due to risks of rebound nasal congestion.   Counseled patient on potential for adverse effects with medications prescribed/recommended today, strict ER and return-to-clinic precautions discussed, patient verbalized understanding.    Final Clinical Impressions(s) / UC Diagnoses   Final diagnoses:  Viral URI with cough  Epistaxis     Discharge Instructions      You have a viral illness which will improve on its own with rest, fluids, and medications to help with your symptoms.  Tylenol , guaifenesin  (plain mucinex ), and saline nasal sprays may help relieve symptoms.   Two  teaspoons of honey in 1 cup of warm water  every 4-6 hours may help with throat pains.  Humidifier in room at nighttime may help soothe cough (clean well daily).   For your nosebleeds, use Afrin 1 to 2 sprays into what ever nostril is bleeding if you are experiencing an active nosebleed. Use 2 puffs of Flonase  daily.  Use saline nasal sprays as needed.  Do not use Afrin long-term as this can cause rebound nasal congestion.   For chest pain, shortness of breath, inability to keep food or fluids down without vomiting, fever that does not respond to tylenol  or motrin , or any other severe symptoms, please go to the ER for further evaluation. Return to urgent care as needed, otherwise follow-up with PCP.     ED Prescriptions     Medication Sig Dispense Auth. Provider   oxymetazoline  (AFRIN NASAL SPRAY) 0.05 % nasal spray Place 1 spray into both nostrils 2 (two) times daily. 30 mL Enedelia Going M, FNP   fluticasone  (FLONASE ) 50 MCG/ACT nasal spray Place 2 sprays into both nostrils daily. 16 g Enedelia Going HERO, FNP      PDMP not reviewed this encounter.   Enedelia Going HERO, [place] 12/17/23 1257

## 2023-12-20 ENCOUNTER — Other Ambulatory Visit (HOSPITAL_COMMUNITY): Payer: Self-pay

## 2023-12-20 ENCOUNTER — Other Ambulatory Visit: Payer: Self-pay

## 2023-12-20 ENCOUNTER — Telehealth: Payer: Self-pay

## 2023-12-20 NOTE — Telephone Encounter (Signed)
 I called patient to make sure she knows her surgery was cancelled by the Pre-Op/ Anesthesiologist. Patient was not notified by pre-op but appreciates the call. Patient is aware that Dr. Alger is fully booked through December.

## 2023-12-20 NOTE — Progress Notes (Signed)
 On chart review, noted pt has been seen in ED 10/14 and 10/16 for URI symptoms and epistaxis. Per anesthesia Dr. KANDICE Fix, pt should be rescheduled for 2 weeks from symptoms. Will notify surgeon.

## 2023-12-21 ENCOUNTER — Ambulatory Visit (HOSPITAL_COMMUNITY): Admission: RE | Admit: 2023-12-21 | Source: Home / Self Care | Admitting: Obstetrics and Gynecology

## 2023-12-21 DIAGNOSIS — I10 Essential (primary) hypertension: Secondary | ICD-10-CM

## 2023-12-21 DIAGNOSIS — Z01812 Encounter for preprocedural laboratory examination: Secondary | ICD-10-CM

## 2023-12-21 DIAGNOSIS — D259 Leiomyoma of uterus, unspecified: Secondary | ICD-10-CM

## 2023-12-21 SURGERY — DILATATION AND CURETTAGE /HYSTEROSCOPY
Anesthesia: Choice

## 2023-12-27 ENCOUNTER — Encounter (HOSPITAL_COMMUNITY): Payer: Self-pay | Admitting: Emergency Medicine

## 2023-12-27 ENCOUNTER — Other Ambulatory Visit: Payer: Self-pay

## 2023-12-27 ENCOUNTER — Ambulatory Visit (HOSPITAL_COMMUNITY): Admission: EM | Admit: 2023-12-27 | Discharge: 2023-12-27 | Disposition: A | Discharge status: Home / Self Care

## 2023-12-27 DIAGNOSIS — J0141 Acute recurrent pansinusitis: Secondary | ICD-10-CM | POA: Diagnosis not present

## 2023-12-27 MED ORDER — AZITHROMYCIN 250 MG PO TABS
250.0000 mg | ORAL_TABLET | Freq: Every day | ORAL | 0 refills | Status: DC
  Filled 2023-12-27: qty 6, 5d supply, fill #0
  Filled 2023-12-28: qty 6, 6d supply, fill #0

## 2023-12-27 MED ORDER — DOXYCYCLINE HYCLATE 100 MG PO CAPS
100.0000 mg | ORAL_CAPSULE | Freq: Two times a day (BID) | ORAL | 0 refills | Status: DC
  Filled 2023-12-27: qty 20, 10d supply, fill #0

## 2023-12-27 NOTE — Discharge Instructions (Signed)
 You have been diagnosed with a sinus infection today, some are caused by viruses and others are caused by bacteria.  If your symptoms have been going on for less than 7 days it is most likely that you have a viral sinus infection.  Antibiotics will not work for this and it will have to run its course.  Sinus rinses (using a Nettie pot) or saline rinses are helpful as well as pseudoephedrine, and nasal sprays along with ibuprofen and Tylenol  for pain.  If you have had your symptoms for more than 7 days you most likely have a bacterial infection and will be prescribed antibiotics.  Supportive measures given for viral sinus infections will also be helpful for bacterial infections.  If you are using antibiotics you should start to feel better in 2 to 3 days but it is important that you complete antibiotics in their entirety.

## 2023-12-27 NOTE — ED Triage Notes (Signed)
 Pt reports for a week had stuffed up nose, nose bleeds and sore throat. Tried nasal spray and other meds without relief. Having sinus pressure as well.

## 2023-12-27 NOTE — ED Provider Notes (Signed)
 [facility]    CSN: [id number] Arrival date & time: 12/27/23  1440      History   Chief Complaint Chief Complaint  Patient presents with   Nasal Congestion   Epistaxis   Sore Throat    HPI Sherri [name] is a 38 y.o. female.   Pt presents today due to 1 week of nasal congestion, throat pain, and sinus pressure. Pt states that she has been experiencing purulent, thick nasal congestion despite use of flonase , afrin, Tylenol , and ibuprofen .   The history is provided by the patient.  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    Urinary tract infection    Uterine fibroid     Patient Active Problem List   Diagnosis Date Noted   Acute appendicitis 07/10/2023   Initiation of Depo Provera  06/29/2023   Numbness and tingling of hand 05/13/2023   Moderate episode of recurrent major depressive disorder (HCC) 07/16/2022   Pre-diabetes 07/23/2021   Anxiety and depression 06/24/2021   Fibromyalgia 06/24/2021   Chronic sinusitis 12/26/2020   Sinus pressure 12/26/2020   Prediabetes 07/05/2020   Gastroesophageal reflux disease 04/26/2020   Perianal rash 03/26/2020   Chronic nausea 03/26/2020   Chronic constipation 03/09/2019   Pelvic pain in female 03/09/2019   Hypertension    Genital herpes    Uterine fibroid 12/25/2013   Obesity 12/25/2013   Bacterial vaginitis 04/27/2012    Past Surgical History:  Procedure Laterality Date   APPENDECTOMY     CESAREAN SECTION N/A 06/05/2014   Procedure: CESAREAN SECTION;  Surgeon: Lang JINNY Peel, DO;  Location: [facility];  Service: Obstetrics;  Laterality: N/A;   FOOT SURGERY     LAPAROSCOPIC APPENDECTOMY N/A 07/10/2023   Procedure: APPENDECTOMY, LAPAROSCOPIC;  Surgeon: Ann Fine, MD;  Location: [facility];   Service: General;  Laterality: N/A;   WISDOM TOOTH EXTRACTION      OB History     Gravida  1   Para  1   Term      Preterm  1   AB      Living  2      SAB      IAB      Ectopic      Multiple  1   Live Births  2            Home Medications    Prior to Admission medications   Medication Sig Start Date End Date Taking? Authorizing Provider  azithromycin  (ZITHROMAX ) 250 MG tablet Take 1 tablet (250 mg total) by mouth daily. Take first 2 tablets together, then 1 every day until finished. 12/27/23  Yes Andra Krabbe C, PA-C  Acetaminophen  Extra Strength 500 MG TABS TAKE 2 TABLETS (1,000 MG TOTAL) BY MOUTH EVERY 6 (SIX) HOURS AS NEEDED FOR UP TO 14 DAYS (PAIN). 12/06/23   McDonald, Juliene SAUNDERS, DPM  amLODipine  (NORVASC ) 10 MG tablet Take 1 tablet (10 mg total) by mouth daily. 11/22/23     amLODipine  (NORVASC ) 5 MG tablet Take 1 tablet by mouth daily as needed (HBP). 08/11/22   [provider]  atenolol -chlorthalidone  (TENORETIC ) 50-25 MG tablet Take 1 tablet by mouth daily. 10/12/23     azelastine  (ASTELIN ) 0.1 % nasal spray Place 1 spray  into both nostrils 2 (two) times daily. Use in each nostril as directed 05/16/23   Reddick, Johnathan B, NP  Azelastine -Fluticasone  137-50 MCG/ACT SUSP Place 1 spray into the nose 2 (two) times daily as needed. 08/12/23   Jeneal Danita Macintosh, MD  clindamycin  (CLEOCIN  T) 1 % lotion Apply to cleansed face daily in the morning. 06/30/21     cyclobenzaprine  (FLEXERIL ) 10 MG tablet Take 1 tablet (10 mg total) by mouth 3 (three) times daily as needed for muscle spasms. 07/15/23   McDonald, Juliene SAUNDERS, DPM  diphenoxylate -atropine  (LOMOTIL ) 2.5-0.025 MG tablet Take 1 tablet by mouth 4 (four) times daily as needed for diarrhea. 11/30/23     Eluxadoline  (VIBERZI ) 75 MG TABS Take 1 tablet (75 mg total) by mouth 2 (two) times daily. 10/29/23     EPINEPHrine  (EPIPEN  2-PAK) 0.3 mg/0.3 mL IJ SOAJ injection Inject 0.3 mg into the muscle as needed for  anaphylaxis. 12/31/22   Jeneal Danita Macintosh, MD  fluticasone  (FLONASE ) 50 MCG/ACT nasal spray Place 2 sprays into both nostrils daily. 12/16/23   Enedelia Dorna HERO, FNP  gabapentin  (NEURONTIN ) 100 MG capsule Take 1 capsule (100 mg total) by mouth 3 (three) times daily. 08/03/23   Maczis, Puja Gosai, PA-C  gabapentin  (NEURONTIN ) 300 MG capsule Take 1 capsule (300 mg total) by mouth 3 (three) times daily for 7 days. 07/29/23 09/09/23  McDonald, Juliene SAUNDERS, DPM  hydrOXYzine  (ATARAX ) 25 MG tablet 25 mg at bedtime as needed (sleep). 08/11/22   [provider]  hydrOXYzine  (ATARAX ) 25 MG tablet Take 1 tablet (25 mg total) by mouth 3 (three) times daily as needed for anxiety. 11/22/23     levocetirizine (XYZAL ) 5 MG tablet Take 1 tablet (5 mg total) by mouth every evening. 08/12/23   Jeneal Danita Macintosh, MD  lidocaine  (LIDODERM ) 5 % Place 1 patch onto the skin daily. Remove & Discard patch within 12 hours or as directed by MD 07/23/23   Griselda Norris, MD  lisinopril (ZESTRIL) 10 MG tablet Take 10 mg by mouth daily as needed. 04/22/23 12/13/23  [provider]  medroxyPROGESTERone  (DEPO-PROVERA ) 150 MG/ML injection Inject 1 mL (150 mg total) into the muscle every 3 (three) months. Patient not taking: Reported on 12/13/2023 06/29/23   Leftwich-Kirby, Olam LABOR, CNM  methocarbamol  (ROBAXIN ) 500 MG tablet Take 1 tablet (500 mg total) by mouth 3 (three) times daily for 14 days 07/23/23     methylPREDNISolone  (MEDROL  DOSEPAK) 4 MG TBPK tablet 6 day dose pack - take as directed 11/17/23   Silva Juliene SAUNDERS, DPM  montelukast  (SINGULAIR ) 10 MG tablet Take 10 mg by mouth at bedtime as needed (allergies). 01/27/22   [provider]  ondansetron  (ZOFRAN -ODT) 8 MG disintegrating tablet Take 1 tablet (8 mg total) by mouth 2 (two) times daily as needed nausea. 11/22/23     oxymetazoline  (AFRIN NASAL SPRAY) 0.05 % nasal spray Place 1 spray into both nostrils 2 (two) times daily. 12/16/23   Enedelia Dorna HERO, FNP  pantoprazole  (PROTONIX ) 40 MG tablet Take 1 tablet (40 mg total) by mouth daily. 08/12/23   Jeneal Danita Macintosh, MD  pantoprazole  (PROTONIX ) 40 MG tablet Take 1 tablet (40 mg total) by mouth 2 (two) times daily. 09/15/23     polyethylene glycol (MIRALAX  / GLYCOLAX ) 17 g packet Take 17 g by mouth daily as needed for mild constipation. 07/13/23   Vicci Burnard SAUNDERS, PA-C  rizatriptan  (MAXALT ) 10 MG tablet Take 1 tablet (10 mg total) by mouth as needed  for migraine. May repeat in 2 hours if needed.  Maximum 2 tablets in 24 hours. 06/11/23   Skeet Juliene SAUNDERS, DO  traMADol  (ULTRAM ) 50 MG tablet Take 1 tablet (50 mg total) by mouth every 6 (six) hours as needed for moderate pain (4-6). 10/13/23     tranexamic acid  (LYSTEDA ) 650 MG TABS tablet Take 2 tablets (1,300 mg total) by mouth 3 (three) times daily. Take during menses for a maximum of five days 08/23/23   Constant, Peggy, MD  valACYclovir  (VALTREX ) 1000 MG tablet Take 1,000 mg by mouth as needed (Outbreaks). 02/03/22   [provider]  Vitamin D , Ergocalciferol , (DRISDOL) 1.25 MG (50000 UNIT) CAPS capsule Take 50,000 Units by mouth every 7 (seven) days. Patient taking differently: Take 50,000 Units by mouth every 7 (seven) days. daily 07/07/22   [provider]    Family History Family History  Problem Relation Age of Onset   Hypertension Mother    Hypertension Father    Breast cancer Maternal Grandmother        22   Colon cancer Neg Hx    Colon polyps Neg Hx    Kidney disease Neg Hx    Diabetes Neg Hx    Esophageal cancer Neg Hx    Gallbladder disease Neg Hx    Heart disease Neg Hx    Asthma Neg Hx    Stroke Neg Hx    Stomach cancer Neg Hx     Social History Social History   Tobacco Use   Smoking status: Never    Passive exposure: Never   Smokeless tobacco: Never  Vaping Use   Vaping status: Never Used  Substance Use Topics   Alcohol use: No   Drug use: Never     Allergies   Augmentin   [amoxicillin -pot clavulanate], Macrobid  [nitrofurantoin ], Metformin  and related, Doxycycline , Amoxicillin , and Cefdinir    Review of Systems Review of Systems  HENT:  Positive for nosebleeds.      Physical Exam Triage Vital Signs ED Triage Vitals  Encounter Vitals Group     BP 12/27/23 1522 (!) 146/94     Girls Systolic BP Percentile --      Girls Diastolic BP Percentile --      Boys Systolic BP Percentile --      Boys Diastolic BP Percentile --      Pulse Rate 12/27/23 1522 87     Resp 12/27/23 1522 17     Temp 12/27/23 1522 98.2 F (36.8 C)     Temp Source 12/27/23 1522 Oral     SpO2 12/27/23 1522 99 %     Weight --      Height --      Head Circumference --      Peak Flow --      Pain Score 12/27/23 1521 7     Pain Loc --      Pain Education --      Exclude from Growth Chart --    No data found.  Updated Vital Signs BP (!) 146/94 (BP Location: Right Arm)   Pulse 87   Temp 98.2 F (36.8 C) (Oral)   Resp 17   LMP 12/24/2023 (Exact Date)   SpO2 99%   Visual Acuity Right Eye Distance:   Left Eye Distance:   Bilateral Distance:    Right Eye Near:   Left Eye Near:    Bilateral Near:     Physical Exam Vitals and nursing note reviewed.  Constitutional:  General: She is not in acute distress.    Appearance: Normal appearance. She is not ill-appearing, toxic-appearing or diaphoretic.  HENT:     Nose: Congestion (moderately enlarged turbinates) present. No rhinorrhea.     Right Sinus: Maxillary sinus tenderness and frontal sinus tenderness present.     Left Sinus: Maxillary sinus tenderness and frontal sinus tenderness present.     Comments: Tenderness to palpation of upper lip     Mouth/Throat:     Mouth: Mucous membranes are moist.     Pharynx: Oropharynx is clear. No oropharyngeal exudate or posterior oropharyngeal erythema.  Eyes:     General: No scleral icterus. Cardiovascular:     Rate and Rhythm: Normal rate and regular rhythm.     Heart sounds:  Normal heart sounds.  Pulmonary:     Effort: Pulmonary effort is normal. No respiratory distress.     Breath sounds: Normal breath sounds. No wheezing or rhonchi.  Skin:    General: Skin is warm.  Neurological:     Mental Status: She is alert and oriented to person, place, and time.  Psychiatric:        Mood and Affect: Mood normal.        Behavior: Behavior normal.      UC Treatments / Results  Labs (all labs ordered are listed, but only abnormal results are displayed) Labs Reviewed - No data to display  EKG   Radiology No results found.  Procedures Procedures (including critical care time)  Medications Ordered in UC Medications - No data to display  Initial Impression / Assessment and Plan / UC Course  I have reviewed the triage vital signs and the nursing notes.  Pertinent labs & imaging results that were available during my care of the patient were reviewed by me and considered in my medical decision making (see chart for details).     Pt was diagnosed with acute sinusitis and prescribed Z-Pak for treatment as she is allergic to the other antibiotics that are first-line for acute sinusitis.  Discussed viral versus bacterial sinusitis as well.  Patient was to have supportive treatment she can use for sinus infection as well. Final Clinical Impressions(s) / UC Diagnoses   Final diagnoses:  Acute recurrent pansinusitis     Discharge Instructions      You have been diagnosed with a sinus infection today, some are caused by viruses and others are caused by bacteria.  If your symptoms have been going on for less than 7 days it is most likely that you have a viral sinus infection.  Antibiotics will not work for this and it will have to run its course.  Sinus rinses (using a Nettie pot) or saline rinses are helpful as well as pseudoephedrine , and nasal sprays along with ibuprofen  and Tylenol  for pain.  If you have had your symptoms for more than 7 days you most likely  have a bacterial infection and will be prescribed antibiotics.  Supportive measures given for viral sinus infections will also be helpful for bacterial infections.  If you are using antibiotics you should start to feel better in 2 to 3 days but it is important that you complete antibiotics in their entirety.    ED Prescriptions     Medication Sig Dispense Auth. Provider   doxycycline  (VIBRAMYCIN ) 100 MG capsule  (Status: Discontinued) Take 1 capsule (100 mg total) by mouth 2 (two) times daily for 10 days. 20 capsule Andra Corean BROCKS, PA-C   azithromycin  (ZITHROMAX ) 250  MG tablet Take 1 tablet (250 mg total) by mouth daily. Take first 2 tablets together, then 1 every day until finished. 6 tablet Andra Corean BROCKS, PA-C      PDMP not reviewed this encounter.   Andra Corean BROCKS, PA-C 12/27/23 1651

## 2023-12-28 ENCOUNTER — Other Ambulatory Visit (HOSPITAL_COMMUNITY): Payer: Self-pay

## 2023-12-28 ENCOUNTER — Ambulatory Visit (INDEPENDENT_AMBULATORY_CARE_PROVIDER_SITE_OTHER): Admitting: Podiatry

## 2023-12-28 ENCOUNTER — Other Ambulatory Visit: Payer: Self-pay

## 2023-12-28 ENCOUNTER — Other Ambulatory Visit: Payer: Self-pay | Admitting: Podiatry

## 2023-12-28 ENCOUNTER — Encounter: Payer: Self-pay | Admitting: Podiatry

## 2023-12-28 VITALS — Ht 59.0 in | Wt 194.0 lb

## 2023-12-28 DIAGNOSIS — M722 Plantar fascial fibromatosis: Secondary | ICD-10-CM | POA: Diagnosis not present

## 2023-12-28 NOTE — Patient Instructions (Signed)

## 2023-12-28 NOTE — Progress Notes (Signed)
  Subjective:  Patient ID: Sherri  [name], female    DOB: 10-14-1985,  MRN: [id number]  Chief Complaint  Patient presents with   Post-op Follow-up    RM 4 6 wk post op. Pt states pain after standing throughout the day.     38 y.o. female returns for post-op check.  Still having pain every day  Review of Systems: Negative except as noted in the HPI. Denies N/V/F/Ch.   Objective:  There were no vitals filed for this visit. There is no height or weight on file to calculate BMI. Constitutional Well developed. Well nourished.  Vascular Foot warm and well perfused. Capillary refill normal to all digits.  Calf is soft and supple, no posterior calf or knee pain, negative Homans' sign  Neurologic Normal speech. Oriented to person, place, and time. Epicritic sensation to light touch grossly present bilaterally.  Dermatologic Incisions are well-healed and not hypertrophic  Orthopedic: Continues to have pain around the bottom plantar and medial and lateral heel globally   Assessment:   No diagnosis found.     Plan:  Patient was evaluated and treated and all questions answered.  S/p foot surgery left - Still has not not improved.  She is 6 months out from surgery on her left foot and 4 months out from surgery on her right foot.  I recommend an MRI to reevaluate the soft tissues around the heel to see if she has any residual plantar fasciitis or if new issues.  To her knowledge she does not have any lumbosacral radiculopathy, or neuropathy to explain her symptoms as well.  I will see her back after the MRI.  We again discussed supportive shoes that would be best for her.  No follow-ups on file.

## 2024-01-05 ENCOUNTER — Ambulatory Visit

## 2024-01-05 ENCOUNTER — Encounter: Payer: Self-pay | Admitting: Emergency Medicine

## 2024-01-05 ENCOUNTER — Ambulatory Visit: Admission: EM | Admit: 2024-01-05 | Discharge: 2024-01-05 | Disposition: A | Discharge status: Home / Self Care

## 2024-01-05 DIAGNOSIS — J019 Acute sinusitis, unspecified: Secondary | ICD-10-CM | POA: Diagnosis not present

## 2024-01-05 DIAGNOSIS — J309 Allergic rhinitis, unspecified: Secondary | ICD-10-CM

## 2024-01-05 MED ORDER — AZITHROMYCIN 250 MG PO TABS: ORAL_TABLET | ORAL | 0 refills | Status: AC

## 2024-01-05 MED ORDER — PANTOPRAZOLE SODIUM 40 MG PO TBEC: 40.0000 mg | DELAYED_RELEASE_TABLET | Freq: Two times a day (BID) | ORAL | 0 refills | Status: DC

## 2024-01-05 NOTE — ED Triage Notes (Signed)
 Pt reports L ear pain and L-sided sore throat x1 week. Also notes intermittent cough, runny nose, and watery eyes. No documented fevers. Xyzal , tylenol , and ibuprofen  with no relief. 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 Describes popping sensation in the L ear like she feels fluid. Denies hearing changes.

## 2024-01-05 NOTE — Discharge Instructions (Signed)
 You have been diagnosed with a sinus infection today, some are caused by viruses and others are caused by bacteria.  If your symptoms have been going on for less than 7 days it is most likely that you have a viral sinus infection.  Antibiotics will not work for this and it will have to run its course.  Sinus rinses (using a Nettie pot) or saline rinses are helpful and nasal sprays along with ibuprofen  and Tylenol  for pain.  If you have had your symptoms for more than 7 days you most likely have a bacterial infection and will be prescribed antibiotics.  Supportive measures given for viral sinus infections will also be helpful for bacterial infections.  If you are using antibiotics you should start to feel better in 2 to 3 days but it is important that you complete antibiotics in their entirety.

## 2024-01-06 ENCOUNTER — Encounter: Payer: Self-pay | Admitting: Podiatry

## 2024-01-06 NOTE — ED Provider Notes (Signed)
 [facility]    CSN: [id number] Arrival date & time: 01/05/24  1851      History   Chief Complaint Chief Complaint  Patient presents with   Otalgia   Sore Throat    HPI Sherri  [name] is a 38 y.o. female.   Pt presents today due to 1 week of left sided facial pain, sinus pressure, nasal congestion, and purulent nasal drainage. Pt states that she has tried several OTC medications without relief of symptoms. Pt states that she is experiencing left sided ear pain as well.   The history is provided by the patient.  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    Urinary tract infection    Uterine fibroid     Patient Active Problem List   Diagnosis Date Noted   Bilateral carpal tunnel syndrome 09/16/2023   Acquired hallux valgus 09/10/2023   OAB (overactive bladder) 07/21/2023   Acute appendicitis 07/10/2023   Initiation of Depo Provera  06/29/2023   Numbness and tingling of hand 05/13/2023   Moderate episode of recurrent major depressive disorder (HCC) 07/16/2022   Pre-diabetes 07/23/2021   Anxiety and depression 06/24/2021   Fibromyalgia 06/24/2021   Chronic sinusitis 12/26/2020   Sinus pressure 12/26/2020   Prediabetes 07/05/2020   Gastroesophageal reflux disease 04/26/2020   Perianal rash 03/26/2020   Chronic nausea 03/26/2020   Chronic constipation 03/09/2019   Pelvic pain in female 03/09/2019   Hypertension    Genital herpes    Uterine fibroid 12/25/2013   Obesity 12/25/2013   Bacterial vaginitis 04/27/2012    Past Surgical History:  Procedure Laterality Date   APPENDECTOMY     CESAREAN SECTION N/A 06/05/2014   Procedure: CESAREAN SECTION;  Surgeon: Lang JINNY Peel, DO;  Location: [facility];  Service: Obstetrics;  Laterality: N/A;    FOOT SURGERY     LAPAROSCOPIC APPENDECTOMY N/A 07/10/2023   Procedure: APPENDECTOMY, LAPAROSCOPIC;  Surgeon: Ann Fine, MD;  Location: [facility];  Service: General;  Laterality: N/A;   WISDOM TOOTH EXTRACTION      OB History     Gravida  1   Para  1   Term      Preterm  1   AB      Living  2      SAB      IAB      Ectopic      Multiple  1   Live Births  2            Home Medications    Prior to Admission medications   Medication Sig Start Date End Date Taking? Authorizing Provider  Acetaminophen  Extra Strength 500 MG TABS TAKE 2 TABLETS (1,000 MG TOTAL) BY MOUTH EVERY 6 (SIX) HOURS AS NEEDED FOR UP TO 14 DAYS (PAIN). 12/28/23  Yes McDonald, Juliene SAUNDERS, DPM  amitriptyline  (ELAVIL ) 50 MG tablet Take 50 mg by mouth. 12/28/23  Yes [provider]  amLODipine  (NORVASC ) 10 MG tablet Take 1 tablet (10 mg total) by mouth daily. 11/22/23  Yes   atenolol -chlorthalidone  (TENORETIC ) 50-25 MG tablet Take 1 tablet by mouth daily. 10/12/23  Yes   azithromycin  (ZITHROMAX ) 250 MG tablet Take first 2 tablets together, then 1 every day until finished. 01/05/24  Yes Andra,  Corean BROCKS, PA-C  cyclobenzaprine  (FLEXERIL ) 10 MG tablet Take 1 tablet (10 mg total) by mouth 3 (three) times daily as needed for muscle spasms. 07/15/23  Yes McDonald, Juliene SAUNDERS, DPM  Eluxadoline  (VIBERZI ) 75 MG TABS Take 1 tablet (75 mg total) by mouth 2 (two) times daily. 10/29/23  Yes   fluticasone  (FLONASE ) 50 MCG/ACT nasal spray Place 2 sprays into both nostrils daily. 12/16/23  Yes Enedelia Dorna HERO, FNP  hydrOXYzine  (ATARAX ) 25 MG tablet 25 mg at bedtime as needed (sleep). 08/11/22  Yes [provider]  ibuprofen  (ADVIL ) 600 MG tablet TAKE 1 TABLET (600 MG TOTAL) BY MOUTH EVERY 6 (SIX) HOURS AS NEEDED FOR UP TO 14 DAYS. 12/28/23 01/11/24 Yes McDonald, Juliene SAUNDERS, DPM  levocetirizine (XYZAL ) 5 MG tablet Take 1 tablet (5 mg total) by mouth every evening. 08/12/23  Yes Padgett, Danita Macintosh, MD   losartan (COZAAR) 25 MG tablet Take 25 mg by mouth daily. 10/06/23  Yes [provider]  omega-3 acid ethyl esters (LOVAZA) 1 g capsule Take 1 capsule by mouth 2 (two) times daily.   Yes [provider]  ondansetron  (ZOFRAN -ODT) 8 MG disintegrating tablet Take 1 tablet (8 mg total) by mouth 2 (two) times daily as needed nausea. 11/22/23  Yes   Vitamin D , Ergocalciferol , (DRISDOL) 1.25 MG (50000 UNIT) CAPS capsule Take 50,000 Units by mouth every 7 (seven) days. Patient taking differently: Take 50,000 Units by mouth every 7 (seven) days. daily 07/07/22  Yes [provider]  azelastine  (ASTELIN ) 0.1 % nasal spray Place 1 spray into both nostrils 2 (two) times daily. Use in each nostril as directed 05/16/23   Reddick, Johnathan B, NP  Azelastine -Fluticasone  137-50 MCG/ACT SUSP Place 1 spray into the nose 2 (two) times daily as needed. Patient not taking: Reported on 01/05/2024 08/12/23   Jeneal Danita Macintosh, MD  EPINEPHrine  (EPIPEN  2-PAK) 0.3 mg/0.3 mL IJ SOAJ injection Inject 0.3 mg into the muscle as needed for anaphylaxis. 12/31/22   Jeneal Danita Macintosh, MD  furosemide (LASIX) 20 MG tablet Take 20 mg by mouth. 09/13/23   [provider]  lidocaine  (LIDODERM ) 5 % Place 1 patch onto the skin daily. Remove & Discard patch within 12 hours or as directed by MD 07/23/23   Griselda Norris, MD  methocarbamol  (ROBAXIN ) 500 MG tablet Take 1 tablet (500 mg total) by mouth 3 (three) times daily for 14 days 07/23/23     montelukast  (SINGULAIR ) 10 MG tablet Take 10 mg by mouth at bedtime as needed (allergies). 01/27/22   [provider]  naproxen  (NAPROSYN ) 375 MG tablet Oral 03/20/17   [provider]  pantoprazole  (PROTONIX ) 40 MG tablet Take 1 tablet (40 mg total) by mouth 2 (two) times daily. 01/05/24   Linkyn Gobin C, PA-C  polyethylene glycol (MIRALAX  / GLYCOLAX ) 17 g packet Take 17 g by mouth daily as needed for mild constipation. 07/13/23    Vicci Burnard SAUNDERS, PA-C  rizatriptan  (MAXALT ) 10 MG tablet Take 1 tablet (10 mg total) by mouth as needed for migraine. May repeat in 2 hours if needed.  Maximum 2 tablets in 24 hours. 06/11/23   Skeet Juliene SAUNDERS, DO  valACYclovir  (VALTREX ) 1000 MG tablet Take 1,000 mg by mouth as needed (Outbreaks). 02/03/22   [provider]    Family History Family History  Problem Relation Age of Onset   Hypertension Mother    Hypertension Father    Breast cancer Maternal Grandmother  70   Colon cancer Neg Hx    Colon polyps Neg Hx    Kidney disease Neg Hx    Diabetes Neg Hx    Esophageal cancer Neg Hx    Gallbladder disease Neg Hx    Heart disease Neg Hx    Asthma Neg Hx    Stroke Neg Hx    Stomach cancer Neg Hx     Social History Social History   Tobacco Use   Smoking status: Never    Passive exposure: Never   Smokeless tobacco: Never  Vaping Use   Vaping status: Never Used  Substance Use Topics   Alcohol use: No   Drug use: Never     Allergies   Augmentin  [amoxicillin -pot clavulanate], Macrobid  [nitrofurantoin ], Metformin  and related, Doxycycline , Amoxicillin , and Cefdinir    Review of Systems Review of Systems  HENT:  Positive for ear pain.      Physical Exam Triage Vital Signs ED Triage Vitals  Encounter Vitals Group     BP 01/05/24 1926 (!) 138/93     Girls Systolic BP Percentile --      Girls Diastolic BP Percentile --      Boys Systolic BP Percentile --      Boys Diastolic BP Percentile --      Pulse Rate 01/05/24 1926 87     Resp 01/05/24 1926 18     Temp 01/05/24 1926 98.6 F (37 C)     Temp Source 01/05/24 1926 Oral     SpO2 01/05/24 1926 98 %     Weight --      Height --      Head Circumference --      Peak Flow --      Pain Score 01/05/24 1927 8     Pain Loc --      Pain Education --      Exclude from Growth Chart --    No data found.  Updated Vital Signs BP (!) 138/93 (BP Location: Left Arm)   Pulse 87   Temp 98.6 F (37 C)  (Oral)   Resp 18   LMP 12/24/2023 (Exact Date)   SpO2 98%   Visual Acuity Right Eye Distance:   Left Eye Distance:   Bilateral Distance:    Right Eye Near:   Left Eye Near:    Bilateral Near:     Physical Exam Vitals and nursing note reviewed.  Constitutional:      General: She is not in acute distress.    Appearance: Normal appearance. She is not ill-appearing, toxic-appearing or diaphoretic.  HENT:     Right Ear: Tympanic membrane, ear canal and external ear normal.     Left Ear: Tympanic membrane, ear canal and external ear normal.     Nose:     Right Sinus: No maxillary sinus tenderness or frontal sinus tenderness.     Left Sinus: Maxillary sinus tenderness and frontal sinus tenderness present.     Comments: Tenderness to palpation of left side of upper lip    Mouth/Throat:     Mouth: Mucous membranes are moist.     Pharynx: Oropharynx is clear. No oropharyngeal exudate or posterior oropharyngeal erythema.  Eyes:     General: No scleral icterus. Cardiovascular:     Rate and Rhythm: Normal rate and regular rhythm.     Heart sounds: Normal heart sounds.  Pulmonary:     Effort: Pulmonary effort is normal. No respiratory distress.     Breath sounds: Normal  breath sounds. No wheezing or rhonchi.  Skin:    General: Skin is warm.  Neurological:     Mental Status: She is alert and oriented to person, place, and time.  Psychiatric:        Mood and Affect: Mood normal.        Behavior: Behavior normal.      UC Treatments / Results  Labs (all labs ordered are listed, but only abnormal results are displayed) Labs Reviewed - No data to display  EKG   Radiology No results found.  Procedures Procedures (including critical care time)  Medications Ordered in UC Medications - No data to display  Initial Impression / Assessment and Plan / UC Course  I have reviewed the triage vital signs and the nursing notes.  Pertinent labs & imaging results that were available  during my care of the patient were reviewed by me and considered in my medical decision making (see chart for details).      Final Clinical Impressions(s) / UC Diagnoses   Final diagnoses:  Acute sinusitis, recurrence not specified, unspecified location     Discharge Instructions      You have been diagnosed with a sinus infection today, some are caused by viruses and others are caused by bacteria.  If your symptoms have been going on for less than 7 days it is most likely that you have a viral sinus infection.  Antibiotics will not work for this and it will have to run its course.  Sinus rinses (using a Nettie pot) or saline rinses are helpful and nasal sprays along with ibuprofen  and Tylenol  for pain.  If you have had your symptoms for more than 7 days you most likely have a bacterial infection and will be prescribed antibiotics.  Supportive measures given for viral sinus infections will also be helpful for bacterial infections.  If you are using antibiotics you should start to feel better in 2 to 3 days but it is important that you complete antibiotics in their entirety.     ED Prescriptions     Medication Sig Dispense Auth. Provider   azithromycin  (ZITHROMAX ) 250 MG tablet Take first 2 tablets together, then 1 every day until finished. 6 tablet Andra Krabbe C, PA-C   pantoprazole  (PROTONIX ) 40 MG tablet Take 1 tablet (40 mg total) by mouth 2 (two) times daily. 60 tablet Andra Krabbe BROCKS, PA-C      PDMP not reviewed this encounter.   Andra Krabbe BROCKS, PA-C 01/06/24 [phone]

## 2024-01-11 ENCOUNTER — Encounter (HOSPITAL_COMMUNITY): Payer: Self-pay | Admitting: *Deleted

## 2024-01-11 ENCOUNTER — Other Ambulatory Visit: Payer: Self-pay

## 2024-01-11 ENCOUNTER — Emergency Department (HOSPITAL_COMMUNITY)
Admission: EM | Admit: 2024-01-11 | Discharge: 2024-01-11 | Disposition: A | Discharge status: Home / Self Care | Attending: Emergency Medicine | Admitting: Emergency Medicine

## 2024-01-11 ENCOUNTER — Other Ambulatory Visit (HOSPITAL_COMMUNITY): Payer: Self-pay

## 2024-01-11 DIAGNOSIS — J0140 Acute pansinusitis, unspecified: Secondary | ICD-10-CM | POA: Insufficient documentation

## 2024-01-11 DIAGNOSIS — J029 Acute pharyngitis, unspecified: Secondary | ICD-10-CM | POA: Diagnosis present

## 2024-01-11 MED ORDER — KETOROLAC TROMETHAMINE 15 MG/ML IJ SOLN
15.0000 mg | Freq: Once | INTRAMUSCULAR | Status: AC
  Administered 2024-01-11: 15 mg via INTRAMUSCULAR
  Filled 2024-01-11: qty 1

## 2024-01-11 MED ORDER — LEVOFLOXACIN 250 MG PO TABS
500.0000 mg | ORAL_TABLET | Freq: Every day | ORAL | 0 refills | Status: AC
  Filled 2024-01-11: qty 20, 10d supply, fill #0

## 2024-01-11 MED ORDER — ONDANSETRON 4 MG PO TBDP
4.0000 mg | ORAL_TABLET | Freq: Once | ORAL | Status: AC
  Administered 2024-01-11: 4 mg via ORAL
  Filled 2024-01-11: qty 1

## 2024-01-11 NOTE — Discharge Instructions (Addendum)
 You are seen in the emergency department today for concerns of sinus pressure and a sore throat.  You likely have continued symptoms from your sinus infection that has likely not responded well to the azithromycin .  I have switched your antibiotic to levofloxacin that you will take once a day for the next 10 days.  Please take this as prescribed.  I believe you would benefit from follow-up with an ENT so I have provided you with their information.  For any concerns of worsening symptoms, follow-up with your primary care provider or return to the emergency department.

## 2024-01-11 NOTE — ED Provider Notes (Signed)
 [place] EMERGENCY DEPARTMENT AT [facility] Provider Note   CSN: [id number] Arrival date & time: 01/11/24  0502     Patient presents with: Sore Throat   Sherri [name] is a 38 y.o. female.  Patient with past history significant for prediabetes, depression, uterine fibroids, GERD presents to the emergency department with concerns of sore throat and sinus pressure.  States that she was recently prescribed azithromycin  without improvement in her symptoms.  States that she cannot take other antibiotics due to allergy  concerns.  Endorses continuation of her symptoms with no significant change in starting azithromycin .  Endorses some nausea but denies any vomiting.   Sore Throat       Prior to Admission medications   Medication Sig Start Date End Date Taking? Authorizing Provider  levofloxacin (LEVAQUIN) 250 MG tablet Take 2 tablets (500 mg total) by mouth daily for 10 days. 01/11/24 01/21/24 Yes Tysean Vandervliet A, PA-C  Acetaminophen  Extra Strength 500 MG TABS TAKE 2 TABLETS (1,000 MG TOTAL) BY MOUTH EVERY 6 (SIX) HOURS AS NEEDED FOR UP TO 14 DAYS (PAIN). 12/28/23   McDonald, Juliene SAUNDERS, DPM  amitriptyline  (ELAVIL ) 50 MG tablet Take 50 mg by mouth. 12/28/23   [provider]  amLODipine  (NORVASC ) 10 MG tablet Take 1 tablet (10 mg total) by mouth daily. 11/22/23     atenolol -chlorthalidone  (TENORETIC ) 50-25 MG tablet Take 1 tablet by mouth daily. 10/12/23     azelastine  (ASTELIN ) 0.1 % nasal spray Place 1 spray into both nostrils 2 (two) times daily. Use in each nostril as directed 05/16/23   Reddick, Johnathan B, NP  Azelastine -Fluticasone  137-50 MCG/ACT SUSP Place 1 spray into the nose 2 (two) times daily as needed. Patient not taking: Reported on 01/05/2024 08/12/23   Jeneal Danita Macintosh, MD  azithromycin  (ZITHROMAX ) 250 MG tablet Take first 2 tablets together, then 1 every day until finished. 01/05/24   Andra Corean BROCKS, PA-C  cyclobenzaprine  (FLEXERIL ) 10 MG  tablet Take 1 tablet (10 mg total) by mouth 3 (three) times daily as needed for muscle spasms. 07/15/23   McDonald, Juliene SAUNDERS, DPM  Eluxadoline  (VIBERZI ) 75 MG TABS Take 1 tablet (75 mg total) by mouth 2 (two) times daily. 10/29/23     EPINEPHrine  (EPIPEN  2-PAK) 0.3 mg/0.3 mL IJ SOAJ injection Inject 0.3 mg into the muscle as needed for anaphylaxis. 12/31/22   Jeneal Danita Macintosh, MD  fluticasone  (FLONASE ) 50 MCG/ACT nasal spray Place 2 sprays into both nostrils daily. 12/16/23   Enedelia Dorna HERO, FNP  furosemide (LASIX) 20 MG tablet Take 20 mg by mouth. 09/13/23   [provider]  hydrOXYzine  (ATARAX ) 25 MG tablet 25 mg at bedtime as needed (sleep). 08/11/22   [provider]  ibuprofen  (ADVIL ) 600 MG tablet TAKE 1 TABLET (600 MG TOTAL) BY MOUTH EVERY 6 (SIX) HOURS AS NEEDED FOR UP TO 14 DAYS. 12/28/23 01/11/24  Silva Juliene SAUNDERS, DPM  levocetirizine (XYZAL ) 5 MG tablet Take 1 tablet (5 mg total) by mouth every evening. 08/12/23   Jeneal Danita Macintosh, MD  lidocaine  (LIDODERM ) 5 % Place 1 patch onto the skin daily. Remove & Discard patch within 12 hours or as directed by MD 07/23/23   Griselda Norris, MD  losartan (COZAAR) 25 MG tablet Take 25 mg by mouth daily. 10/06/23   [provider]  methocarbamol  (ROBAXIN ) 500 MG tablet Take 1 tablet (500 mg total) by mouth 3 (three) times daily for 14 days 07/23/23     montelukast  (SINGULAIR ) 10  MG tablet Take 10 mg by mouth at bedtime as needed (allergies). 01/27/22   [provider]  naproxen  (NAPROSYN ) 375 MG tablet Oral 03/20/17   [provider]  omega-3 acid ethyl esters (LOVAZA) 1 g capsule Take 1 capsule by mouth 2 (two) times daily.    [provider]  ondansetron  (ZOFRAN -ODT) 8 MG disintegrating tablet Take 1 tablet (8 mg total) by mouth 2 (two) times daily as needed nausea. 11/22/23     pantoprazole  (PROTONIX ) 40 MG tablet Take 1 tablet (40 mg total) by mouth 2 (two) times daily. 01/05/24    Peterson, Stephanie C, PA-C  polyethylene glycol (MIRALAX  / GLYCOLAX ) 17 g packet Take 17 g by mouth daily as needed for mild constipation. 07/13/23   Vicci Burnard SAUNDERS, PA-C  rizatriptan  (MAXALT ) 10 MG tablet Take 1 tablet (10 mg total) by mouth as needed for migraine. May repeat in 2 hours if needed.  Maximum 2 tablets in 24 hours. 06/11/23   Skeet Juliene SAUNDERS, DO  valACYclovir  (VALTREX ) 1000 MG tablet Take 1,000 mg by mouth as needed (Outbreaks). 02/03/22   [provider]  Vitamin D , Ergocalciferol , (DRISDOL) 1.25 MG (50000 UNIT) CAPS capsule Take 50,000 Units by mouth every 7 (seven) days. Patient taking differently: Take 50,000 Units by mouth every 7 (seven) days. daily 07/07/22   [provider]    Allergies: Augmentin  [amoxicillin -pot clavulanate], Macrobid  [nitrofurantoin ], Metformin  and related, Doxycycline , Amoxicillin , and Cefdinir     Review of Systems  HENT:  Positive for sore throat.   All other systems reviewed and are negative.   Updated Vital Signs BP (!) 170/102   Pulse 86   Temp 98.4 F (36.9 C) (Oral)   Resp 18   LMP 12/24/2023 (Exact Date)   SpO2 97%   Physical Exam Vitals and nursing note reviewed.  Constitutional:      General: She is not in acute distress.    Appearance: She is well-developed.  HENT:     Head: Normocephalic and atraumatic.     Mouth/Throat:     Mouth: Mucous membranes are moist.     Pharynx: Oropharynx is clear. No pharyngeal swelling, oropharyngeal exudate, posterior oropharyngeal erythema or uvula swelling.     Tonsils: No tonsillar exudate or tonsillar abscesses. 1+ on the right. 1+ on the left.  Eyes:     Conjunctiva/sclera: Conjunctivae normal.  Cardiovascular:     Rate and Rhythm: Normal rate and regular rhythm.     Heart sounds: No murmur heard. Pulmonary:     Effort: Pulmonary effort is normal. No respiratory distress.     Breath sounds: Normal breath sounds.  Abdominal:     Palpations: Abdomen is soft.      Tenderness: There is no abdominal tenderness.  Musculoskeletal:        General: No swelling.     Cervical back: Neck supple.  Skin:    General: Skin is warm and dry.     Capillary Refill: Capillary refill takes less than 2 seconds.  Neurological:     Mental Status: She is alert.  Psychiatric:        Mood and Affect: Mood normal.     (all labs ordered are listed, but only abnormal results are displayed) Labs Reviewed - No data to display  EKG: None  Radiology: No results found.   Procedures   Medications Ordered in the ED  ondansetron  (ZOFRAN -ODT) disintegrating tablet 4 mg (4 mg Oral Given 01/11/24 0551)  ketorolac  (TORADOL ) 15 MG/ML injection 15  mg (15 mg Intramuscular Given 01/11/24 0551)                                    Medical Decision Making  This patient presents to the ED for concern of sore throat, facial pain.  Differential diagnosis includes pharyngitis, acute sinusitis, pansinusitis, viral URI    Additional history obtained:  Additional history obtained from chart review    Medicines ordered and prescription drug management:  I ordered medication including Toradol , Zofran  for pain, nausea Reevaluation of the patient after these medicines showed that the patient improved I have reviewed the patients home medicines and have made adjustments as needed   Problem List / ED Course:  Patient presented to the emergency department with concerns of a sore throat and facial pain.  States that she was started on azithromycin  for concerns of bacterial sinusitis.  She endorses nonproductive cough, facial pain and pressure, and green-colored mucus.  No reported fever, chills, or bodyaches.  Denies nausea, vomiting, diarrhea.  Patient was started on azithromycin  from urgent care and states she has not had significant proving or changes in symptoms during the course of her treatment.  She was unable to start first-line medications due to allergy  concerns.  I am  concerned that she maybe has not received adequate response due to improper antibiotic coverage due to sensitivities. On exam, patient has no significant oropharyngeal erythema, swelling, or exudate. Given reassuring vitals and lack of significant findings on exam, low concern for peritonsillar abscess or retropharyngeal abscess.  I suspect patient likely has complications from likely resistance with treatment with azithromycin .  Will broaden antibiotic coverage and start patient on a respiratory fluoroquinolone.  Levofloxacin sent to pharmacy.  Advised patient that if symptoms or not improving, she would benefit from ENT evaluation follow-up given recurrent issues with sinus infections.  Return precautions discussed and advised.  She is otherwise stable for outpatient follow-up.  Toradol  and Zofran  given prior to discharge for symptom control.   Social Determinants of Health:  None  Final diagnoses:  Acute non-recurrent pansinusitis    ED Discharge Orders          Ordered    levofloxacin (LEVAQUIN) 250 MG tablet  Daily        01/11/24 0602               Lovell Nuttall A, PA-C 01/11/24 [id number]    Raford Lenis, MD 01/11/24 [phone]

## 2024-01-11 NOTE — ED Triage Notes (Signed)
 C/o sore throat x 2 weeks; has been taking antibiotics without improvement. Feels pain is getting worse. Nausea started 2 days ago

## 2024-01-12 ENCOUNTER — Other Ambulatory Visit: Payer: Self-pay | Admitting: Podiatry

## 2024-01-17 ENCOUNTER — Other Ambulatory Visit: Payer: Self-pay

## 2024-01-18 ENCOUNTER — Other Ambulatory Visit: Payer: Self-pay

## 2024-01-19 ENCOUNTER — Other Ambulatory Visit (HOSPITAL_COMMUNITY): Payer: Self-pay

## 2024-01-19 ENCOUNTER — Other Ambulatory Visit

## 2024-01-24 ENCOUNTER — Other Ambulatory Visit: Payer: Self-pay

## 2024-01-27 NOTE — Progress Notes (Addendum)
 Subjective Patient ID: Sherri [name] is a 38 y.o. female.    Patient here for evaluation of body aches for about a week.  Patient reports having dry cough, runny nose, and sneezing.  No definite fever, but has had some chills.  Patient reports body aches in the upper back and shoulder area.  No vomiting or diarrhea.  Patient reports she has had some increased urinary frequency and mild dysuria.  Patient was recently treated for a sinus infection in the ER on 01/11/2024 and was given 10 days of levofloxacin .  Patient also had follow up with ENT 01/17/2024 and symptoms were improving.     History provided by:  Patient Language interpreter used: No     Review of Systems  Constitutional:  Positive for chills and fatigue. Negative for activity change, diaphoresis and fever.  HENT:  Positive for congestion, rhinorrhea and sneezing. Negative for ear discharge, ear pain, facial swelling, sore throat and trouble swallowing.   Respiratory:  Positive for cough. Negative for shortness of breath and wheezing.   Cardiovascular:  Negative for chest pain.  Gastrointestinal:  Negative for abdominal pain, diarrhea, nausea and vomiting.  Genitourinary:  Positive for dysuria and frequency. Negative for menstrual problem and vaginal bleeding.  Musculoskeletal:  Negative for arthralgias and myalgias.  Skin:  Negative for rash.  Neurological:  Negative for dizziness, numbness and headaches.    Patient History  Allergies: No Known Allergies  History reviewed. No pertinent past medical history. History reviewed. No pertinent surgical history. Social History   Socioeconomic History  . Marital status: Single    Spouse name: Not on file  . Number of children: Not on file  . Years of education: Not on file  . Highest education level: Not on file  Occupational History  . Not on file  Tobacco Use  . Smoking status: Never  . Smokeless tobacco: Never  Substance and Sexual Activity  . Alcohol use: Not on  file  . Drug use: Not on file  . Sexual activity: Not on file  Other Topics Concern  . Not on file  Social History Narrative  . Not on file   History reviewed. No pertinent family history. Current Outpatient Medications on File Prior to Visit  Medication Sig Dispense Refill  . amitriptyline  (Elavil ) 50 MG tablet Take 50 mg by mouth.    . amLODIPine  (Norvasc ) 10 MG tablet Take 10 mg by mouth.    . atenolol -chlorthalidone  (Tenoretic ) 50-25 MG tablet Take 1 tablet by mouth 1 (one) time each day.    . fluticasone  (Flonase ) 50 MCG/ACT nasal spray Administer 2 sprays into affected nostril(s).    . furosemide (Lasix) 20 MG tablet Take 20 mg by mouth.    . hydrOXYzine  HCl (Atarax ) 25 MG tablet     . losartan (Cozaar) 25 MG tablet Take 1 tablet by mouth 1 (one) time each day.    . ondansetron  (Zofran ) 4 MG tablet TAKE 1 TABLET BY MOUTH EVERY 8 HOURS AS NEEDED FOR NAUSEA AND VOMITING    . Viberzi  75 MG tablet Take 75 mg by mouth in the morning and 75 mg in the evening.    . cholecalciferol (D 5000) 125 MCG (5000 UT) capsule Take 1 capsule by mouth 1 (one) time each day.     No current facility-administered medications on file prior to visit.    Objective  Vitals:   01/27/24 1450  BP: (!) 142/97  Pulse: 72  Resp: 18  Temp: 37.1 C (98.7 F)  TempSrc: Oral  SpO2: 100%  Weight: 86.6 kg  Height: 4' 11  PainSc: 0-No pain               No results found.  Physical Exam Vitals and nursing note reviewed.  Constitutional:      General: She is not in acute distress.    Appearance: Normal appearance. She is not ill-appearing, toxic-appearing or diaphoretic.  HENT:     Head: Normocephalic and atraumatic.     Right Ear: Tympanic membrane, ear canal and external ear normal.     Left Ear: Tympanic membrane, ear canal and external ear normal.     Nose: Nose normal.     Mouth/Throat:     Mouth: Mucous membranes are moist.     Pharynx: Oropharynx is clear.  Eyes:      Conjunctiva/sclera: Conjunctivae normal.     Pupils: Pupils are equal, round, and reactive to light.  Cardiovascular:     Rate and Rhythm: Normal rate and regular rhythm.     Pulses: Normal pulses.     Heart sounds: Normal heart sounds.  Pulmonary:     Effort: Pulmonary effort is normal. No respiratory distress.     Breath sounds: Normal breath sounds. No wheezing, rhonchi or rales.  Musculoskeletal:     Cervical back: Normal range of motion and neck supple.  Lymphadenopathy:     Cervical: No cervical adenopathy.  Skin:    General: Skin is warm.  Neurological:     Mental Status: She is alert.     Results for orders placed or performed in visit on 01/27/24  POCT QuickVue Antigen Test  Component Result   Rapid Covid Negative   Internal Quality Control Pass  POCT RAPID INFLUENZA MCKESSON  Component Result   Rapid Influenza A Ag Negative   Rapid Influenza B Ag Negative   Internal Quality Control Pass  POCT pregnancy, urine  Component Result   Preg Test, Ur Negative   Internal Quality Control Pass  POCT urinalysis dipstick  Component Result   Color, UA Yellow   Clarity, UA Clear   Glucose, UA Negative   Bilirubin, UA Undetected   Ketones, UA Negative   Spec Grav, UA 1.015   POCT Blood UA Negative   pH, UA 6.0   Protein, UA Negative   Urobilinogen, UA Negative   Leukocytes, UA Negative   Nitrite, UA Negative       Procedures MDM:     1 Acute, uncomplicated illness or injury     Explanation of Medical Decision Making and variances from expected care:  Patient stable, no acute distress.  Blood pressure is elevated, but otherwise normal vitals. Negative flu and COVID test.  Urine analysis within normal limits, HCG negative.  Discussed patient symptoms in the context of her recent sinusitis.  No wheezing or chest pain, but with her symptoms soreness in the upper back I discussed doing a chest x ray, which patient declined.  Patient was upset that I was not able to  determine the cause of her symptoms.  I advised rest, supportive care, and to follow up with primary care provider.     Review of prior notes: One     Risk:: Low            Assessment/Plan Diagnoses and all orders for this visit:  Body aches -     POCT pregnancy, urine -     POCT urinalysis dipstick  Sneezing  Acute cough  Other orders -  POCT QuickVue Antigen Test -     POCT RAPID INFLUENZA MCKESSON     Disposition Status: Home  Patient Instructions  We have discussed different possible causes of symptoms including viral illness, your recent sinus infection, etc Negative influenza and COVID test In house urine analysis within normal limits We have discussed doing a chest x ray, but you have declined at this time Recommend rest, continue to drink plenty of fluids May continue OTC Tylenol  for pain as needed, as directed Follow up with primary care provider for further evaluation Seek further medical evaluation if you have worsening of symptoms such as chest pain, shortness of breath, fever  Progress note signed by Eva Rase, PA on 01/27/24 at  4:54 PM

## 2024-01-28 ENCOUNTER — Other Ambulatory Visit: Payer: Self-pay

## 2024-01-28 ENCOUNTER — Emergency Department (HOSPITAL_COMMUNITY)
Admission: EM | Admit: 2024-01-28 | Discharge: 2024-01-29 | Disposition: A | Discharge status: Home / Self Care | Attending: Emergency Medicine | Admitting: Emergency Medicine

## 2024-01-28 ENCOUNTER — Encounter (HOSPITAL_COMMUNITY): Payer: Self-pay

## 2024-01-28 ENCOUNTER — Other Ambulatory Visit (HOSPITAL_COMMUNITY): Payer: Self-pay

## 2024-01-28 DIAGNOSIS — Z79899 Other long term (current) drug therapy: Secondary | ICD-10-CM | POA: Insufficient documentation

## 2024-01-28 DIAGNOSIS — E119 Type 2 diabetes mellitus without complications: Secondary | ICD-10-CM | POA: Insufficient documentation

## 2024-01-28 DIAGNOSIS — B349 Viral infection, unspecified: Secondary | ICD-10-CM | POA: Insufficient documentation

## 2024-01-28 DIAGNOSIS — I10 Essential (primary) hypertension: Secondary | ICD-10-CM | POA: Insufficient documentation

## 2024-01-28 DIAGNOSIS — R112 Nausea with vomiting, unspecified: Secondary | ICD-10-CM | POA: Diagnosis present

## 2024-01-28 LAB — CBC
HCT: 34.7 % — ABNORMAL LOW (ref 36.0–46.0)
Hemoglobin: 11 g/dL — ABNORMAL LOW (ref 12.0–15.0)
MCH: 27.5 pg (ref 26.0–34.0)
MCHC: 31.7 g/dL (ref 30.0–36.0)
MCV: 86.8 fL (ref 80.0–100.0)
Platelets: 293 K/uL (ref 150–400)
RBC: 4 MIL/uL (ref 3.87–5.11)
RDW: 17.9 % — ABNORMAL HIGH (ref 11.5–15.5)
WBC: 6.3 K/uL (ref 4.0–10.5)
nRBC: 0 % (ref 0.0–0.2)

## 2024-01-28 LAB — COMPREHENSIVE METABOLIC PANEL WITH GFR
ALT: 12 U/L (ref 0–44)
AST: 17 U/L (ref 15–41)
Albumin: 4.3 g/dL (ref 3.5–5.0)
Alkaline Phosphatase: 70 U/L (ref 38–126)
Anion gap: 8 (ref 5–15)
BUN: 14 mg/dL (ref 6–20)
CO2: 23 mmol/L (ref 22–32)
Calcium: 9.6 mg/dL (ref 8.9–10.3)
Chloride: 106 mmol/L (ref 98–111)
Creatinine, Ser: 0.72 mg/dL (ref 0.44–1.00)
GFR, Estimated: >60 mL/min (ref >60)
Glucose, Bld: 100 mg/dL — ABNORMAL HIGH (ref 70–99)
Potassium: 3.7 mmol/L (ref 3.5–5.1)
Sodium: 138 mmol/L (ref 135–145)
Total Bilirubin: <0.2 mg/dL (ref 0.0–1.2)
Total Protein: 7.2 g/dL (ref 6.5–8.1)

## 2024-01-28 LAB — HCG, SERUM, QUALITATIVE: Preg, Serum: NEGATIVE

## 2024-01-28 MED ORDER — ONDANSETRON HCL 4 MG/2ML IJ SOLN
4.0000 mg | Freq: Once | INTRAMUSCULAR | Status: AC
Start: 2024-01-28 — End: 2024-01-28
  Administered 2024-01-28: 4 mg via INTRAVENOUS
  Filled 2024-01-28: qty 2

## 2024-01-28 MED ORDER — LACTATED RINGERS IV BOLUS
500.0000 mL | Freq: Once | INTRAVENOUS | Status: AC
  Administered 2024-01-28: 500 mL via INTRAVENOUS

## 2024-01-28 MED ORDER — ONDANSETRON 4 MG PO TBDP: 4.0000 mg | ORAL_TABLET | Freq: Three times a day (TID) | ORAL | 0 refills | Status: AC | PRN

## 2024-01-28 NOTE — Discharge Instructions (Signed)
Avoid fried foods, fatty foods, greasy foods, and milk products until symptoms resolve. Drink plenty of clear liquids to prevent dehydration. We recommend the use of Zofran as prescribed for nausea/vomiting. Follow-up with your primary care doctor to ensure resolution of symptoms. 

## 2024-01-28 NOTE — ED Triage Notes (Signed)
 PT states she has been having body ache n/v/d for about a week.

## 2024-01-28 NOTE — ED Provider Notes (Signed)
 [place] EMERGENCY DEPARTMENT AT [facility] Provider Note   CSN: [id number] Arrival date & time: 01/28/24  2152     Patient presents with: Flu Like Symptoms   Sherri [name] is a 38 y.o. female.  {Add pertinent medical, surgical, social history, OB history to HPI:6875} 38 year old female with a history of hypertension, diabetes presents to the emergency department for evaluation of nausea, vomiting, diarrhea, and bodyaches.  She states that her symptoms have been persistent over the past week or so.  She has been able to eat and drink fluids today, but this provokes her nausea.  She has been using over-the-counter medications such as Tylenol  and ibuprofen .  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  She has not had any fevers, urinary symptoms.  Was seen at urgent care yesterday for these complaints with a negative flu and COVID test as well as a negative urinalysis.  Also was on a 10-day course of Levaquin  earlier this month for URI symptoms; symptoms were supposedly improving at outpatient ENT follow-up on 01/18/2024.  The history is provided by the patient. No language interpreter was used.       Prior to Admission medications   Medication Sig Start Date End Date Taking? Authorizing Provider  Acetaminophen  Extra Strength 500 MG TABS TAKE 2 TABLETS (1,000 MG TOTAL) BY MOUTH EVERY 6 (SIX) HOURS AS NEEDED FOR UP TO 14 DAYS (PAIN). 01/18/24   McDonald, Juliene SAUNDERS, DPM  amitriptyline  (ELAVIL ) 50 MG tablet Take 50 mg by mouth. 12/28/23   [provider]  amLODipine  (NORVASC ) 10 MG tablet Take 1 tablet (10 mg total) by mouth daily. 11/22/23     atenolol -chlorthalidone  (TENORETIC ) 50-25 MG tablet Take 1 tablet by mouth daily. 10/12/23     azelastine  (ASTELIN ) 0.1 % nasal spray Place 1 spray into both nostrils 2 (two) times daily. Use in each nostril as directed 05/16/23   Reddick, Johnathan B, NP  Azelastine -Fluticasone   137-50 MCG/ACT SUSP Place 1 spray into the nose 2 (two) times daily as needed. Patient not taking: Reported on 01/05/2024 08/12/23   Jeneal Danita Macintosh, MD  azithromycin  (ZITHROMAX ) 250 MG tablet Take first 2 tablets together, then 1 every day until finished. 01/05/24   Andra Corean BROCKS, PA-C  cyclobenzaprine  (FLEXERIL ) 10 MG tablet Take 1 tablet (10 mg total) by mouth 3 (three) times daily as needed for muscle spasms. 07/15/23   McDonald, Juliene SAUNDERS, DPM  Eluxadoline  (VIBERZI ) 75 MG TABS Take 1 tablet (75 mg total) by mouth 2 (two) times daily. 10/29/23     EPINEPHrine  (EPIPEN  2-PAK) 0.3 mg/0.3 mL IJ SOAJ injection Inject 0.3 mg into the muscle as needed for anaphylaxis. 12/31/22   Jeneal Danita Macintosh, MD  fluticasone  (FLONASE ) 50 MCG/ACT nasal spray Place 2 sprays into both nostrils daily. 12/16/23   Enedelia Dorna HERO, FNP  furosemide (LASIX) 20 MG tablet Take 20 mg by mouth. 09/13/23   [provider]  hydrOXYzine  (ATARAX ) 25 MG tablet 25 mg at bedtime as needed (sleep). 08/11/22   [provider]  ibuprofen  (ADVIL ) 600 MG tablet TAKE 1 TABLET (600 MG TOTAL) BY MOUTH EVERY 6 (SIX) HOURS AS NEEDED FOR UP TO 14 DAYS. 01/18/24 02/01/24  Silva Juliene SAUNDERS, DPM  levocetirizine (XYZAL ) 5 MG tablet Take 1 tablet (5 mg total) by mouth every evening. 08/12/23   Jeneal Danita Macintosh, MD  lidocaine  (LIDODERM ) 5 % Place 1 patch onto the skin daily. Remove & Discard  patch within 12 hours or as directed by MD 07/23/23   Griselda Norris, MD  losartan (COZAAR) 25 MG tablet Take 25 mg by mouth daily. 10/06/23   [provider]  methocarbamol  (ROBAXIN ) 500 MG tablet Take 1 tablet (500 mg total) by mouth 3 (three) times daily for 14 days 07/23/23     montelukast  (SINGULAIR ) 10 MG tablet Take 10 mg by mouth at bedtime as needed (allergies). 01/27/22   [provider]  naproxen  (NAPROSYN ) 375 MG tablet Oral 03/20/17   [provider]  omega-3 acid ethyl esters  (LOVAZA) 1 g capsule Take 1 capsule by mouth 2 (two) times daily.    [provider]  ondansetron  (ZOFRAN -ODT) 8 MG disintegrating tablet Take 1 tablet (8 mg total) by mouth 2 (two) times daily as needed nausea. 11/22/23     pantoprazole  (PROTONIX ) 40 MG tablet Take 1 tablet (40 mg total) by mouth 2 (two) times daily. 01/05/24   Peterson, Stephanie C, PA-C  polyethylene glycol (MIRALAX  / GLYCOLAX ) 17 g packet Take 17 g by mouth daily as needed for mild constipation. 07/13/23   Vicci Burnard SAUNDERS, PA-C  rizatriptan  (MAXALT ) 10 MG tablet Take 1 tablet (10 mg total) by mouth as needed for migraine. May repeat in 2 hours if needed.  Maximum 2 tablets in 24 hours. 06/11/23   Skeet Juliene SAUNDERS, DO  valACYclovir  (VALTREX ) 1000 MG tablet Take 1,000 mg by mouth as needed (Outbreaks). 02/03/22   [provider]  Vitamin D , Ergocalciferol , (DRISDOL) 1.25 MG (50000 UNIT) CAPS capsule Take 50,000 Units by mouth every 7 (seven) days. Patient taking differently: Take 50,000 Units by mouth every 7 (seven) days. daily 07/07/22   [provider]    Allergies: Augmentin  [amoxicillin -pot clavulanate], Macrobid  [nitrofurantoin ], Metformin  and related, Doxycycline , Amoxicillin , and Cefdinir     Review of Systems Ten systems reviewed and are negative for acute change, except as noted in the HPI.    Updated Vital Signs BP (!) 142/116 (BP Location: Right Arm)   Pulse 84   Temp 98.3 F (36.8 C) (Oral)   Resp 18   Ht 4' 11 (1.499 m)   Wt 81.6 kg   LMP 01/10/2024   SpO2 100%   BMI 36.36 kg/m   Physical Exam Vitals and nursing note reviewed.  Constitutional:      General: She is not in acute distress.    Appearance: She is well-developed. She is not diaphoretic.     Comments: Nontoxic appearing and in NAD  HENT:     Head: Normocephalic and atraumatic.  Eyes:     General: No scleral icterus.    Conjunctiva/sclera: Conjunctivae normal.  Cardiovascular:     Rate and Rhythm: Normal rate and  regular rhythm.     Pulses: Normal pulses.  Pulmonary:     Effort: Pulmonary effort is normal. No respiratory distress.     Comments: Respirations even and unlabored Abdominal:     Comments: Soft, obese abdomen. No focal TTP. No peritoneal signs.  Musculoskeletal:        General: Normal range of motion.     Cervical back: Normal range of motion.  Skin:    General: Skin is warm and dry.     Coloration: Skin is not pale.     Findings: No erythema or rash.  Neurological:     Mental Status: She is alert and oriented to person, place, and time.  Psychiatric:        Behavior: Behavior normal.     (  all labs ordered are listed, but only abnormal results are displayed) Labs Reviewed  CBC - Abnormal; Notable for the following components:      Result Value   Hemoglobin 11.0 (*)    HCT 34.7 (*)    RDW 17.9 (*)    All other components within normal limits  COMPREHENSIVE METABOLIC PANEL WITH GFR  HCG, SERUM, QUALITATIVE    EKG: None  Radiology: No results found.  {Document cardiac monitor, telemetry assessment procedure when appropriate:32947} Procedures   Medications Ordered in the ED  lactated ringers  bolus 500 mL (has no administration in time range)  ondansetron  (ZOFRAN ) injection 4 mg (has no administration in time range)      {Click here for ABCD2, HEART and other calculators REFRESH Note before signing:1}                              Medical Decision Making Amount and/or Complexity of Data Reviewed Labs: ordered.  Risk Prescription drug management.   ***  {Document critical care time when appropriate  Document review of labs and clinical decision tools ie CHADS2VASC2, etc  Document your independent review of radiology images and any outside records  Document your discussion with family members, caretakers and with consultants  Document social determinants of health affecting pt's care  Document your decision making why or why not admission, treatments were  needed:32947:::1}   Final diagnoses:  None    ED Discharge Orders     None

## 2024-01-30 ENCOUNTER — Encounter: Payer: Self-pay | Admitting: Emergency Medicine

## 2024-01-30 ENCOUNTER — Ambulatory Visit (INDEPENDENT_AMBULATORY_CARE_PROVIDER_SITE_OTHER)

## 2024-01-30 ENCOUNTER — Ambulatory Visit: Admission: EM | Admit: 2024-01-30 | Discharge: 2024-01-30 | Disposition: A | Discharge status: Home / Self Care

## 2024-01-30 ENCOUNTER — Ambulatory Visit
Admission: RE | Admit: 2024-01-30 | Discharge: 2024-01-30 | Disposition: A | Source: Ambulatory Visit | Attending: Podiatry

## 2024-01-30 DIAGNOSIS — M722 Plantar fascial fibromatosis: Secondary | ICD-10-CM

## 2024-01-30 DIAGNOSIS — R051 Acute cough: Secondary | ICD-10-CM

## 2024-01-30 DIAGNOSIS — J069 Acute upper respiratory infection, unspecified: Secondary | ICD-10-CM

## 2024-01-30 MED ORDER — GUAIFENESIN ER 600 MG PO TB12: 600.0000 mg | ORAL_TABLET | Freq: Two times a day (BID) | ORAL | 0 refills | Status: AC

## 2024-01-30 MED ORDER — PREDNISONE 50 MG PO TABS: ORAL_TABLET | ORAL | 0 refills | Status: DC

## 2024-01-30 MED ORDER — BENZONATATE 100 MG PO CAPS: 100.0000 mg | ORAL_CAPSULE | Freq: Three times a day (TID) | ORAL | 0 refills | Status: AC

## 2024-01-30 NOTE — ED Triage Notes (Signed)
 Pt presents c/o URI x 7 days. Pt states,  I have been feeling like this for about a week now. I have congestion, coughing, sneezing, runny nose, body ache,and fever.  Pt denies diarrhea and emesis but does c/o nausea.

## 2024-01-30 NOTE — ED Provider Notes (Signed)
 [facility]    CSN: [id number] Arrival date & time: 01/30/24  1334      History   Chief Complaint Chief Complaint  Patient presents with   URI    HPI Sherri  [name] is a 38 y.o. female.   Pt presents today due to cough productive sputum, nausea, nasal congestion, fatigue, and bodyaches for the past 7 days.  Patient states that she has been using cough drops, ibuprofen , Tylenol , herbal tea, and sinus rinse with no significant relief of symptoms.  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  Patient was seen in the ED 2 days ago, and another urgent care 1 day before being seen in the ED.  Patient does not appear in acute distress.  The history is provided by the patient.  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    Urinary tract infection    Uterine fibroid     Patient Active Problem List   Diagnosis Date Noted   Bilateral carpal tunnel syndrome 09/16/2023   Acquired hallux valgus 09/10/2023   OAB (overactive bladder) 07/21/2023   Acute appendicitis 07/10/2023   Initiation of Depo Provera  06/29/2023   Numbness and tingling of hand 05/13/2023   Moderate episode of recurrent major depressive disorder (HCC) 07/16/2022   Pre-diabetes 07/23/2021   Anxiety and depression 06/24/2021   Fibromyalgia 06/24/2021   Chronic sinusitis 12/26/2020   Sinus pressure 12/26/2020   Prediabetes 07/05/2020   Gastroesophageal reflux disease 04/26/2020   Perianal rash 03/26/2020   Chronic nausea 03/26/2020   Chronic constipation 03/09/2019   Pelvic pain in female 03/09/2019   Hypertension    Genital herpes    Uterine fibroid 12/25/2013   Obesity 12/25/2013   Bacterial vaginitis 04/27/2012    Past Surgical History:  Procedure Laterality Date   APPENDECTOMY     CESAREAN SECTION  N/A 06/05/2014   Procedure: CESAREAN SECTION;  Surgeon: Lang JINNY Peel, DO;  Location: [facility];  Service: Obstetrics;  Laterality: N/A;   FOOT SURGERY     LAPAROSCOPIC APPENDECTOMY N/A 07/10/2023   Procedure: APPENDECTOMY, LAPAROSCOPIC;  Surgeon: Ann Fine, MD;  Location: [facility];  Service: General;  Laterality: N/A;   WISDOM TOOTH EXTRACTION      OB History     Gravida  1   Para  1   Term      Preterm  1   AB      Living  2      SAB      IAB      Ectopic      Multiple  1   Live Births  2            Home Medications    Prior to Admission medications   Medication Sig Start Date End Date Taking? Authorizing Provider  Acetaminophen  Extra Strength 500 MG TABS TAKE 2 TABLETS (1,000 MG TOTAL) BY MOUTH EVERY 6 (SIX) HOURS AS NEEDED FOR UP TO 14 DAYS (PAIN). 01/18/24  Yes McDonald, Juliene SAUNDERS, DPM  amitriptyline  (ELAVIL ) 50 MG tablet Take 50 mg by mouth. 12/28/23   [provider]  amLODipine  (NORVASC ) 10 MG tablet Take 1 tablet (10 mg total) by mouth daily. 11/22/23     atenolol -chlorthalidone  (TENORETIC ) 50-25 MG tablet Take 1 tablet  by mouth daily. 10/12/23     azelastine  (ASTELIN ) 0.1 % nasal spray Place 1 spray into both nostrils 2 (two) times daily. Use in each nostril as directed 05/16/23   Reddick, Johnathan B, NP  Azelastine -Fluticasone  137-50 MCG/ACT SUSP Place 1 spray into the nose 2 (two) times daily as needed. Patient not taking: Reported on 01/05/2024 08/12/23   Jeneal Danita Macintosh, MD  azithromycin  (ZITHROMAX ) 250 MG tablet Take first 2 tablets together, then 1 every day until finished. 01/05/24   Andra Corean BROCKS, PA-C  cyclobenzaprine  (FLEXERIL ) 10 MG tablet Take 1 tablet (10 mg total) by mouth 3 (three) times daily as needed for muscle spasms. 07/15/23   McDonald, Juliene SAUNDERS, DPM  Eluxadoline  (VIBERZI ) 75 MG TABS Take 1 tablet (75 mg total) by mouth 2 (two) times daily. 10/29/23     EPINEPHrine  (EPIPEN  2-PAK) 0.3 mg/0.3 mL IJ SOAJ injection  Inject 0.3 mg into the muscle as needed for anaphylaxis. 12/31/22   Jeneal Danita Macintosh, MD  fluticasone  (FLONASE ) 50 MCG/ACT nasal spray Place 2 sprays into both nostrils daily. 12/16/23   Enedelia Dorna HERO, FNP  furosemide (LASIX) 20 MG tablet Take 20 mg by mouth. 09/13/23   [provider]  hydrOXYzine  (ATARAX ) 25 MG tablet 25 mg at bedtime as needed (sleep). 08/11/22   [provider]  ibuprofen  (ADVIL ) 600 MG tablet TAKE 1 TABLET (600 MG TOTAL) BY MOUTH EVERY 6 (SIX) HOURS AS NEEDED FOR UP TO 14 DAYS. 01/18/24 02/01/24  Silva Juliene SAUNDERS, DPM  levocetirizine (XYZAL ) 5 MG tablet Take 1 tablet (5 mg total) by mouth every evening. 08/12/23   Jeneal Danita Macintosh, MD  lidocaine  (LIDODERM ) 5 % Place 1 patch onto the skin daily. Remove & Discard patch within 12 hours or as directed by MD 07/23/23   Griselda Norris, MD  losartan (COZAAR) 25 MG tablet Take 25 mg by mouth daily. 10/06/23   [provider]  methocarbamol  (ROBAXIN ) 500 MG tablet Take 1 tablet (500 mg total) by mouth 3 (three) times daily for 14 days 07/23/23     montelukast  (SINGULAIR ) 10 MG tablet Take 10 mg by mouth at bedtime as needed (allergies). 01/27/22   [provider]  naproxen  (NAPROSYN ) 375 MG tablet Oral 03/20/17   [provider]  omega-3 acid ethyl esters (LOVAZA) 1 g capsule Take 1 capsule by mouth 2 (two) times daily.    [provider]  ondansetron  (ZOFRAN -ODT) 4 MG disintegrating tablet Take 1 tablet (4 mg total) by mouth every 8 (eight) hours as needed for nausea or vomiting. 01/28/24   Keith Sor, PA-C  pantoprazole  (PROTONIX ) 40 MG tablet Take 1 tablet (40 mg total) by mouth 2 (two) times daily. 01/05/24   Korrey Schleicher C, PA-C  polyethylene glycol (MIRALAX  / GLYCOLAX ) 17 g packet Take 17 g by mouth daily as needed for mild constipation. 07/13/23   Vicci Sor SAUNDERS, PA-C  rizatriptan  (MAXALT ) 10 MG tablet Take 1 tablet (10 mg total) by mouth as needed for  migraine. May repeat in 2 hours if needed.  Maximum 2 tablets in 24 hours. 06/11/23   Skeet Juliene SAUNDERS, DO  valACYclovir  (VALTREX ) 1000 MG tablet Take 1,000 mg by mouth as needed (Outbreaks). 02/03/22   [provider]  Vitamin D , Ergocalciferol , (DRISDOL) 1.25 MG (50000 UNIT) CAPS capsule Take 50,000 Units by mouth every 7 (seven) days. Patient taking differently: Take 50,000 Units by mouth every 7 (seven) days. daily 07/07/22   [provider]  Family History Family History  Problem Relation Age of Onset   Hypertension Mother    Hypertension Father    Breast cancer Maternal Grandmother        36   Colon cancer Neg Hx    Colon polyps Neg Hx    Kidney disease Neg Hx    Diabetes Neg Hx    Esophageal cancer Neg Hx    Gallbladder disease Neg Hx    Heart disease Neg Hx    Asthma Neg Hx    Stroke Neg Hx    Stomach cancer Neg Hx     Social History Social History   Tobacco Use   Smoking status: Never    Passive exposure: Never   Smokeless tobacco: Never  Vaping Use   Vaping status: Never Used  Substance Use Topics   Alcohol use: No   Drug use: Never     Allergies   Augmentin  [amoxicillin -pot clavulanate], Macrobid  [nitrofurantoin ], Metformin  and related, Doxycycline , Amoxicillin , and Cefdinir    Review of Systems Review of Systems   Physical Exam Triage Vital Signs ED Triage Vitals  Encounter Vitals Group     BP 01/30/24 1503 (!) 146/87     Girls Systolic BP Percentile --      Girls Diastolic BP Percentile --      Boys Systolic BP Percentile --      Boys Diastolic BP Percentile --      Pulse Rate 01/30/24 1503 82     Resp 01/30/24 1503 18     Temp 01/30/24 1503 99.1 F (37.3 C)     Temp Source 01/30/24 1503 Oral     SpO2 01/30/24 1503 97 %     Weight 01/30/24 1502 179 lb 14.3 oz (81.6 kg)     Height --      Head Circumference --      Peak Flow --      Pain Score 01/30/24 1501 8     Pain Loc --      Pain Education --      Exclude from Growth  Chart --    No data found.  Updated Vital Signs BP (!) 146/87 (BP Location: Right Arm)   Pulse 82   Temp 99.1 F (37.3 C) (Oral)   Resp 18   Wt 179 lb 14.3 oz (81.6 kg)   LMP 01/10/2024 (Exact Date)   SpO2 97%   BMI 36.33 kg/m   Visual Acuity Right Eye Distance:   Left Eye Distance:   Bilateral Distance:    Right Eye Near:   Left Eye Near:    Bilateral Near:     Physical Exam Vitals and nursing note reviewed.  Constitutional:      General: She is not in acute distress.    Appearance: Normal appearance. She is not ill-appearing, toxic-appearing or diaphoretic.  HENT:     Nose: Congestion (moderately enlarged turbinates) present. No rhinorrhea.     Mouth/Throat:     Mouth: Mucous membranes are moist.     Pharynx: Oropharynx is clear. No oropharyngeal exudate or posterior oropharyngeal erythema.  Eyes:     General: No scleral icterus. Cardiovascular:     Rate and Rhythm: Normal rate and regular rhythm.     Heart sounds: Normal heart sounds.  Pulmonary:     Effort: Pulmonary effort is normal. No respiratory distress.     Breath sounds: Normal breath sounds. No wheezing or rhonchi.  Skin:    General: Skin is warm.  Neurological:  Mental Status: She is alert and oriented to person, place, and time.  Psychiatric:        Mood and Affect: Mood normal.        Behavior: Behavior normal.      UC Treatments / Results  Labs (all labs ordered are listed, but only abnormal results are displayed) Labs Reviewed - No data to display  EKG   Radiology No results found.  Procedures Procedures (including critical care time)  Medications Ordered in UC Medications - No data to display  Initial Impression / Assessment and Plan / UC Course  I have reviewed the triage vital signs and the nursing notes.  Pertinent labs & imaging results that were available during my care of the patient were reviewed by me and considered in my medical decision making (see chart for  details).    Final Clinical Impressions(s) / UC Diagnoses   Final diagnoses:  Acute cough  Viral URI     Discharge Instructions      Chest x-ray looks normal according to my preliminary overread.  If we will just do something different we will call you and let you know  Discussed viral illness it is the patient, advised her that there are several viruses that cause similar symptoms.  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  Today you have been diagnosed with a musculoskeletal injury.  Adults may use 400 mg of ibuprofen  and 1000 mg of Tylenol  together every 8 hours as needed for pain control.  Children may take ibuprofen  and Tylenol  as directed on medication packaging.  You should use ice on affected area for 20 minutes at a time a couple times a day for the first 24 hours then you may switch to heat in the same intervals.  Be sure to put a barrier between ice or heat source and skin to prevent burns.  May also wrap affected area and Ace bandage if tolerated and appropriate, and elevate above the level of the heart to help reduce swelling.  Do not wrap Ace bandages around neck or torso as wrapping too tight can restrict air movement inability to breathe.  If symptoms do not seem to be improving in 3 to 5 days after following these instructions we need to follow-up with orthopedist or PCP.      ED Prescriptions   None    PDMP not reviewed this encounter.   Andra Corean BROCKS, PA-C 01/30/24 1555

## 2024-01-30 NOTE — Discharge Instructions (Addendum)
 Chest x-ray looks normal according to my preliminary overread.  If we will just do something different we will call you and let you know  Discussed viral illness it is the patient, advised her that there are several viruses that cause similar symptoms.  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  Today you have been diagnosed with a musculoskeletal injury.  Adults may use 400 mg of ibuprofen  and 1000 mg of Tylenol  together every 8 hours as needed for pain control.  Children may take ibuprofen  and Tylenol  as directed on medication packaging.  You should use ice on affected area for 20 minutes at a time a couple times a day for the first 24 hours then you may switch to heat in the same intervals.  Be sure to put a barrier between ice or heat source and skin to prevent burns.  May also wrap affected area and Ace bandage if tolerated and appropriate, and elevate above the level of the heart to help reduce swelling.  Do not wrap Ace bandages around neck or torso as wrapping too tight can restrict air movement inability to breathe.  If symptoms do not seem to be improving in 3 to 5 days after following these instructions we need to follow-up with orthopedist or PCP.

## 2024-01-31 ENCOUNTER — Other Ambulatory Visit (HOSPITAL_COMMUNITY): Payer: Self-pay

## 2024-01-31 ENCOUNTER — Ambulatory Visit: Admitting: Podiatry

## 2024-01-31 NOTE — Progress Notes (Deleted)
 NEUROLOGY FOLLOW UP OFFICE NOTE  Alexiss  A Majewski [id number]  Assessment/Plan:   Chronic migraine without aura, without status migrainosus, not intractable  Migraine prevention:  Plan to start Emgality .  Instructed not to get pregnant and must be off medication for 6 months before trying to get pregnant. Migraine rescue:  Try rizatriptan  10mg .  Zofran  ODT 4mg . Limit use of pain relievers to no more than 2 days out of week to prevent risk of rebound or medication-overuse headache. Keep headache diary Follow up 6 months.    Subjective:  Sherri  [name] is a 38 year old female with DM II and iron-deficiency anemia who follows up for migraines.  UPDATE: For preventative therapy, started Emgality  For acute therapy, prescribed rizatriptan .   Intensity:  severe Duration:  *** with rizatriptan . Frequency:  *** Frequency of abortive medication: ibuprofen  every other day Current NSAIDS/analgesics:  none Current triptans: rizatriptan  10mg  Current ergotamine:  none Current anti-emetic:  Zofran  ODT 4mg  Current muscle relaxants:  none Current Antihypertensive medications:  lisinopril, amlodipine  Current Antidepressant medications:  amitriptyline  50mg  QHS (to help sleep) Current Anticonvulsant medications:  none Current anti-CGRP:  Emgality  Current Antihistamines/Decongestants:  Flonase , Allegra , Sudafed Other therapy:  none Birth control:  none   Caffeine:  No coffee or colas Diet:  Sometimes ginger ale.  Trying to increase water  intake.  Eats fast food.   Exercise:  no Depression:  no; Anxiety:  yes Sleep hygiene:  good with amitriptyline .  Gets 6-7 hours.  Wakes up tired.  She has been told that she snores in the past.    HISTORY: Started having headaches starting around September 2023.  No prior history.  No known preceding trigger (illness, head injury, new medication) except for possibly stress.  They are moderate but can be severe.  It is a pounding pain across the  forehead.  No neck pain.  Associated with nausea, photophobia, phonophobia, osmophobia.  No associated vomiting, visual disturbance or unilateral numbness or weakness.  No preceding aura or prodrome.  Often wakes up with headache.  They often last all day.  They occur every other day.  Usually will take a pain reliever no more than twice a week.    They were frequent in September and October, requiring 2 ED/Urgent Care visits.  CT head on 11/30/2021 personally reviewed was normal.    She also has chronic abdominal pain with nausea, vomiting and constipation.  GI workup has been negative.    Past NSAIDS/analgesics:  acetaminophen , Excedrin, ibuprofen , diclofenac , naproxen  Past abortive triptans:   sumatriptan  25mg  (ineffective and makes her dizzy) Past abortive ergotamine:  none Past muscle relaxants:  Flexeril , Robaxin  Past anti-emetic:  promethazine  Past antihypertensive medications:  none Past antidepressant medications:  duloxetine , mirtazapine  Past anticonvulsant medications:  topiramate  (made her feel dizzy) Past anti-CGRP:  none Past vitamins/Herbal/Supplements:  none Past antihistamines/decongestants:  Zyrtec  Other past therapies:  none   Family history of headache:  no. No family history of cerebral aneurysms.    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 112 tablet 0   amitriptyline  (ELAVIL ) 50 MG tablet Take 50 mg by mouth.     amLODipine  (NORVASC ) 10 MG tablet Take 1 tablet (10 mg  total) by mouth daily. 90 tablet 1   atenolol -chlorthalidone  (TENORETIC ) 50-25 MG tablet Take 1 tablet by mouth daily. 90 tablet 0   azelastine  (ASTELIN ) 0.1 % nasal spray Place 1 spray into both nostrils 2 (two) times daily. Use in each nostril as directed 30 mL 1   Azelastine -Fluticasone  137-50 MCG/ACT SUSP Place 1 spray into the nose 2 (two) times daily as needed. (Patient not taking: Reported on 01/05/2024) 23 g 3   azithromycin  (ZITHROMAX ) 250 MG tablet Take first 2 tablets together, then 1 every day until finished. 6 tablet 0   benzonatate  (TESSALON ) 100 MG capsule Take 1 capsule (100 mg total) by mouth every 8 (eight) hours. 30 capsule 0   cyclobenzaprine  (FLEXERIL ) 10 MG tablet Take 1 tablet (10 mg total) by mouth 3 (three) times daily as needed for muscle spasms. 30 tablet 0   Eluxadoline  (VIBERZI ) 75 MG TABS Take 1 tablet (75 mg total) by mouth 2 (two) times daily. 60 tablet 5   EPINEPHrine  (EPIPEN  2-PAK) 0.3 mg/0.3 mL IJ SOAJ injection Inject 0.3 mg into the muscle as needed for anaphylaxis. 2 each 1   fluticasone  (FLONASE ) 50 MCG/ACT nasal spray Place 2 sprays into both nostrils daily. 16 g 0   furosemide (LASIX) 20 MG tablet Take 20 mg by mouth.     guaiFENesin  (MUCINEX ) 600 MG 12 hr tablet Take 1 tablet (600 mg total) by mouth 2 (two) times daily for 10 days. 20 tablet 0   hydrOXYzine  (ATARAX ) 25 MG tablet 25 mg at bedtime as needed (sleep).     ibuprofen  (ADVIL ) 600 MG tablet TAKE 1 TABLET (600 MG TOTAL) BY MOUTH EVERY 6 (SIX) HOURS AS NEEDED FOR UP TO 14 DAYS. 56 tablet 0   levocetirizine (XYZAL ) 5 MG tablet Take 1 tablet (5 mg total) by mouth every evening. 90 tablet 3   lidocaine  (LIDODERM ) 5 % Place 1 patch onto the skin daily. Remove & Discard patch within 12 hours or as directed by MD 14 patch 0   losartan (COZAAR) 25 MG tablet Take 25 mg by mouth daily.     methocarbamol  (ROBAXIN ) 500 MG tablet Take 1 tablet (500 mg total) by mouth 3 (three) times daily for 14 days 42 tablet 0    montelukast  (SINGULAIR ) 10 MG tablet Take 10 mg by mouth at bedtime as needed (allergies).     naproxen  (NAPROSYN ) 375 MG tablet Oral     omega-3 acid ethyl esters (LOVAZA) 1 g capsule Take 1 capsule by mouth 2 (two) times daily.     ondansetron  (ZOFRAN -ODT) 4 MG disintegrating tablet Take 1 tablet (4 mg total) by mouth every 8 (eight) hours as needed for nausea or vomiting. 6 tablet 0   pantoprazole  (PROTONIX ) 40 MG tablet Take 1 tablet (40 mg total) by mouth 2 (two) times daily. 60 tablet 0   polyethylene glycol (MIRALAX  / GLYCOLAX ) 17 g packet Take 17 g by mouth daily as needed for mild constipation.     predniSONE  (DELTASONE ) 50 MG tablet Take 1 tab po daily for 5 days 5 tablet 0   rizatriptan  (MAXALT ) 10 MG tablet Take 1 tablet (10 mg total) by mouth as needed for migraine. May repeat in 2 hours if needed.  Maximum  2 tablets in 24 hours. 10 tablet 5   valACYclovir  (VALTREX ) 1000 MG tablet Take 1,000 mg by mouth as needed (Outbreaks).     Vitamin D , Ergocalciferol , (DRISDOL) 1.25 MG (50000 UNIT) CAPS capsule Take 50,000 Units by mouth every 7 (seven) days. (Patient taking differently: Take 50,000 Units by mouth every 7 (seven) days. daily)     No current facility-administered medications on file prior to visit.    ALLERGIES: Allergies  Allergen Reactions   Augmentin  [Amoxicillin -Pot Clavulanate] Nausea And Vomiting   Macrobid  [Nitrofurantoin ] Nausea And Vomiting   Metformin  And Related Nausea And Vomiting   Doxycycline  Nausea And Vomiting   Amoxicillin  Rash   Cefdinir  Itching    FAMILY HISTORY: Family History  Problem Relation Age of Onset   Hypertension Mother    Hypertension Father    Breast cancer Maternal Grandmother        40   Colon cancer Neg Hx    Colon polyps Neg Hx    Kidney disease Neg Hx    Diabetes Neg Hx    Esophageal cancer Neg Hx    Gallbladder disease Neg Hx    Heart disease Neg Hx    Asthma Neg Hx    Stroke Neg Hx    Stomach cancer Neg Hx        Objective:  *** General: No acute distress.  Patient appears well-groomed.   ***   Juliene Dunnings, DO  CC: Maude Nephew, MD

## 2024-02-01 ENCOUNTER — Ambulatory Visit: Admitting: Neurology

## 2024-02-01 ENCOUNTER — Ambulatory Visit (INDEPENDENT_AMBULATORY_CARE_PROVIDER_SITE_OTHER)

## 2024-02-01 DIAGNOSIS — J309 Allergic rhinitis, unspecified: Secondary | ICD-10-CM | POA: Diagnosis not present

## 2024-02-02 ENCOUNTER — Other Ambulatory Visit (HOSPITAL_COMMUNITY): Payer: Self-pay

## 2024-02-02 ENCOUNTER — Other Ambulatory Visit: Payer: Self-pay

## 2024-02-02 MED ORDER — ONDANSETRON 8 MG PO TBDP
8.0000 mg | ORAL_TABLET | Freq: Two times a day (BID) | ORAL | 3 refills | Status: DC | PRN
  Filled 2024-02-02: qty 60, 30d supply, fill #0

## 2024-02-07 NOTE — Progress Notes (Unsigned)
 NEUROLOGY FOLLOW UP OFFICE NOTE  Sherri [name] [id number]  Assessment/Plan:   Chronic migraine without aura, without status migrainosus, not intractable  Over 15 headache days a month for over 3 months, failed Emgality , topiramate , duloxetine .   Migraine prevention:  Plan to start Botox Migraine rescue:  rizatriptan  10mg  or OTC analgesic for now.  Zofran  ODT 4mg . Limit use of pain relievers to no more than 9 days out of month to prevent risk of rebound or medication-overuse headache. Keep headache diary Follow up 6 months.    Subjective:  Sherri [name] is a 38 year old female with DM II and iron-deficiency anemia who follows up for migraines.  UPDATE: For preventative therapy, started Emgality .  She discontinued it because it made her feel sick.   For acute therapy, prescribed rizatriptan  which also causes her lightheadedness.   Intensity:  severe Duration:  10-12 minutes with rizatriptan  but causes dizziness. Frequency:  varies but on average 15 days a month Frequency of abortive medication: ibuprofen  every other day Current NSAIDS/analgesics:  none Current triptans: rizatriptan  10mg  Current ergotamine:  none Current anti-emetic:  Zofran  ODT 4mg  Current muscle relaxants:  none Current Antihypertensive medications:  lisinopril, amlodipine  Current Antidepressant medications:  amitriptyline  50mg  QHS (to help sleep) Current Anticonvulsant medications:  none Current anti-CGRP:  Emgality  Current Antihistamines/Decongestants:  Flonase , Allegra , Sudafed Other therapy:  none Birth control:  none   Caffeine:  No coffee or colas Diet:  Sometimes ginger ale.  Trying to increase water  intake.  Eats fast food.   Exercise:  no Depression:  no; Anxiety:  yes Sleep hygiene:  good with amitriptyline .  Gets 6-7 hours.  Wakes up tired.  She has been told that she snores in the past.    HISTORY: Started having headaches starting around September 2023.  No prior history.  No  known preceding trigger (illness, head injury, new medication) except for possibly stress.  They are moderate but can be severe.  It is a pounding pain across the forehead.  No neck pain.  Associated with nausea, photophobia, phonophobia, osmophobia.  No associated vomiting, visual disturbance or unilateral numbness or weakness.  No preceding aura or prodrome.  Often wakes up with headache.  They often last all day.  They occur every other day.  Usually will take a pain reliever no more than twice a week.    They were frequent in September and October, requiring 2 ED/Urgent Care visits.  CT head on 11/30/2021 personally reviewed was normal.    She also has chronic abdominal pain with nausea, vomiting and constipation.  GI workup has been negative.    Past NSAIDS/analgesics:  acetaminophen , Excedrin, ibuprofen , diclofenac , naproxen  Past abortive triptans:   sumatriptan  25mg  (ineffective and makes her dizzy) Past abortive ergotamine:  none Past muscle relaxants:  Flexeril , Robaxin  Past anti-emetic:  promethazine  Past antihypertensive medications:  none Past antidepressant medications:  duloxetine , mirtazapine  Past anticonvulsant medications:  topiramate  (made her feel dizzy) Past anti-CGRP:  Emgality  (made her feel sick/dizzy) Past vitamins/Herbal/Supplements:  none Past antihistamines/decongestants:  Zyrtec  Other past therapies:  none   Family history of headache:  no. No family history of cerebral aneurysms.    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 112 tablet 0   amitriptyline  (ELAVIL ) 50 MG tablet Take 50 mg by mouth.     amLODipine  (NORVASC ) 10 MG tablet Take 1 tablet (10 mg total) by mouth daily. 90 tablet 1   atenolol -chlorthalidone  (TENORETIC ) 50-25 MG tablet Take 1 tablet by mouth daily. 90 tablet 0   azelastine  (ASTELIN ) 0.1 % nasal spray Place 1 spray into both nostrils 2 (two) times daily. Use in each nostril as directed 30 mL 1   Azelastine -Fluticasone  137-50 MCG/ACT SUSP Place 1 spray into the nose 2 (two) times daily as needed. (Patient not taking: Reported on 01/05/2024) 23 g 3   azithromycin  (ZITHROMAX ) 250 MG tablet Take first 2 tablets together, then 1 every day until finished. 6 tablet 0   benzonatate  (TESSALON ) 100 MG capsule Take 1 capsule (100 mg total) by mouth every 8 (eight) hours. 30 capsule 0   cyclobenzaprine  (FLEXERIL ) 10 MG tablet Take 1 tablet (10 mg total) by mouth 3 (three) times daily as needed for muscle spasms. 30 tablet 0   Eluxadoline  (VIBERZI ) 75 MG TABS Take 1 tablet (75 mg total) by mouth 2 (two) times daily. 60 tablet 5   EPINEPHrine  (EPIPEN  2-PAK) 0.3 mg/0.3 mL IJ SOAJ injection Inject 0.3 mg into the muscle as needed for anaphylaxis. 2 each 1   fluticasone  (FLONASE ) 50 MCG/ACT nasal spray Place 2 sprays into both nostrils daily. 16 g 0   furosemide (LASIX) 20 MG tablet Take 20 mg by mouth.     guaiFENesin  (MUCINEX ) 600 MG 12 hr tablet Take 1 tablet (600 mg total) by mouth 2 (two) times daily for 10 days. 20 tablet 0   hydrOXYzine  (ATARAX ) 25 MG tablet 25 mg at bedtime as needed (sleep).     levocetirizine (XYZAL ) 5 MG tablet Take 1 tablet (5 mg total) by mouth every evening. 90 tablet 3   lidocaine  (LIDODERM ) 5 % Place 1 patch onto the skin daily. Remove & Discard patch within 12 hours or as directed by MD 14 patch 0   losartan (COZAAR) 25 MG tablet Take 25 mg by mouth daily.     methocarbamol  (ROBAXIN ) 500 MG tablet Take 1 tablet (500 mg  total) by mouth 3 (three) times daily for 14 days 42 tablet 0   montelukast  (SINGULAIR ) 10 MG tablet Take 10 mg by mouth at bedtime as needed (allergies).     naproxen  (NAPROSYN ) 375 MG tablet Oral     omega-3 acid ethyl esters (LOVAZA) 1 g capsule Take 1 capsule by mouth 2 (two) times daily.     ondansetron  (ZOFRAN -ODT) 4 MG disintegrating tablet Take 1 tablet (4 mg total) by mouth every 8 (eight) hours as needed for nausea or vomiting. 6 tablet 0   ondansetron  (ZOFRAN -ODT) 8 MG disintegrating tablet Place 1 tablet (8 mg total) inside cheek 2 (two) times daily as needed for nausea. 60 tablet 3   pantoprazole  (PROTONIX ) 40 MG tablet Take 1 tablet (40 mg total) by mouth 2 (two) times daily. 60 tablet 0   polyethylene glycol (MIRALAX  / GLYCOLAX ) 17 g packet Take 17 g by mouth daily as needed for mild constipation.     predniSONE  (DELTASONE ) 50 MG tablet Take 1 tab po daily for 5 days  5 tablet 0   rizatriptan  (MAXALT ) 10 MG tablet Take 1 tablet (10 mg total) by mouth as needed for migraine. May repeat in 2 hours if needed.  Maximum 2 tablets in 24 hours. 10 tablet 5   valACYclovir  (VALTREX ) 1000 MG tablet Take 1,000 mg by mouth as needed (Outbreaks).     Vitamin D , Ergocalciferol , (DRISDOL) 1.25 MG (50000 UNIT) CAPS capsule Take 50,000 Units by mouth every 7 (seven) days. (Patient taking differently: Take 50,000 Units by mouth every 7 (seven) days. daily)     No current facility-administered medications on file prior to visit.    ALLERGIES: Allergies  Allergen Reactions   Augmentin  [Amoxicillin -Pot Clavulanate] Nausea And Vomiting   Macrobid  [Nitrofurantoin ] Nausea And Vomiting   Metformin  And Related Nausea And Vomiting   Doxycycline  Nausea And Vomiting   Amoxicillin  Rash   Cefdinir  Itching    FAMILY HISTORY: Family History  Problem Relation Age of Onset   Hypertension Mother    Hypertension Father    Breast cancer Maternal Grandmother        61   Colon cancer Neg Hx    Colon polyps  Neg Hx    Kidney disease Neg Hx    Diabetes Neg Hx    Esophageal cancer Neg Hx    Gallbladder disease Neg Hx    Heart disease Neg Hx    Asthma Neg Hx    Stroke Neg Hx    Stomach cancer Neg Hx       Objective:  Blood pressure (!) 145/83, pulse 86, height 4' 11 (1.499 m), weight 190 lb (86.2 kg), last menstrual period 01/10/2024, SpO2 99%. General: No acute distress.  Patient appears well-groomed.     Juliene Dunnings, DO  CC: Peter Coccaro, MD

## 2024-02-08 ENCOUNTER — Ambulatory Visit: Admitting: Podiatry

## 2024-02-08 ENCOUNTER — Encounter: Payer: Self-pay | Admitting: Neurology

## 2024-02-08 ENCOUNTER — Telehealth: Payer: Self-pay

## 2024-02-08 ENCOUNTER — Ambulatory Visit: Admitting: Neurology

## 2024-02-08 VITALS — BP 145/83 | HR 86 | Ht 59.0 in | Wt 190.0 lb

## 2024-02-08 DIAGNOSIS — G43709 Chronic migraine without aura, not intractable, without status migrainosus: Secondary | ICD-10-CM

## 2024-02-08 NOTE — Telephone Encounter (Signed)
 Patient seen in office today, Per Dr.Jaffe patient to start Botox 200 units every 90 days.   PA team please start  PA for Botox patient scheduled for 03/10/24.

## 2024-02-08 NOTE — Patient Instructions (Signed)
Plan to start Botox

## 2024-02-09 ENCOUNTER — Telehealth: Payer: Self-pay | Admitting: Pharmacy Technician

## 2024-02-09 ENCOUNTER — Other Ambulatory Visit (HOSPITAL_COMMUNITY): Payer: Self-pay

## 2024-02-09 NOTE — Telephone Encounter (Signed)
 Pharmacy Patient Advocate Encounter   Received notification from Pt Calls Messages that prior authorization for BOTOX 200 is required/requested.   Insurance verification completed.   The patient is insured through [facility] MEDICAID.   Per test claim: PA required; PA submitted to above mentioned insurance via Latent Key/confirmation #/EOC [id number] Status is pending

## 2024-02-09 NOTE — Telephone Encounter (Signed)
 PA has been submitted, and telephone encounter has been created. Please see telephone encounter dated 12.10.25.

## 2024-02-10 ENCOUNTER — Other Ambulatory Visit: Payer: Self-pay | Admitting: Podiatry

## 2024-02-11 ENCOUNTER — Ambulatory Visit: Admitting: Allergy

## 2024-02-11 ENCOUNTER — Other Ambulatory Visit: Payer: Self-pay

## 2024-02-11 ENCOUNTER — Encounter: Payer: Self-pay | Admitting: Allergy

## 2024-02-11 ENCOUNTER — Other Ambulatory Visit (HOSPITAL_BASED_OUTPATIENT_CLINIC_OR_DEPARTMENT_OTHER): Payer: Self-pay

## 2024-02-11 ENCOUNTER — Other Ambulatory Visit (HOSPITAL_COMMUNITY): Payer: Self-pay

## 2024-02-11 VITALS — BP 124/76 | HR 84 | Temp 97.9°F | Resp 16 | Ht 59.25 in | Wt 186.7 lb

## 2024-02-11 DIAGNOSIS — K219 Gastro-esophageal reflux disease without esophagitis: Secondary | ICD-10-CM

## 2024-02-11 DIAGNOSIS — J3089 Other allergic rhinitis: Secondary | ICD-10-CM | POA: Diagnosis not present

## 2024-02-11 DIAGNOSIS — J302 Other seasonal allergic rhinitis: Secondary | ICD-10-CM | POA: Diagnosis not present

## 2024-02-11 MED ORDER — CETIRIZINE HCL 10 MG PO TABS
10.0000 mg | ORAL_TABLET | Freq: Every day | ORAL | 5 refills | Status: AC
  Filled 2024-02-11: qty 30, 30d supply, fill #0
  Filled 2024-02-14 – 2024-02-18 (×3): qty 30, 30d supply, fill #1
  Filled 2024-03-14: qty 30, 30d supply, fill #2
  Filled 2024-04-01 – 2024-04-05 (×2): qty 30, 30d supply, fill #3

## 2024-02-11 MED ORDER — ONDANSETRON 8 MG PO TBDP
8.0000 mg | ORAL_TABLET | Freq: Two times a day (BID) | ORAL | 3 refills | Status: AC | PRN
  Filled 2024-02-11 – 2024-02-24 (×3): qty 60, 30d supply, fill #0
  Filled 2024-03-29: qty 60, 30d supply, fill #1

## 2024-02-11 MED ORDER — EPINEPHRINE 0.3 MG/0.3ML IJ SOAJ
0.3000 mg | INTRAMUSCULAR | 1 refills | Status: AC | PRN
  Filled 2024-02-11: qty 2, 30d supply, fill #0
  Filled 2024-02-16 – 2024-02-18 (×2): qty 2, 30d supply, fill #1

## 2024-02-11 MED ORDER — AZELASTINE-FLUTICASONE 137-50 MCG/ACT NA SUSP
1.0000 | Freq: Two times a day (BID) | NASAL | 3 refills | Status: AC | PRN
  Filled 2024-02-11 (×2): qty 23, 30d supply, fill #0
  Filled 2024-02-18: qty 23, 30d supply, fill #1

## 2024-02-11 MED ORDER — LANSOPRAZOLE 30 MG PO CPDR
30.0000 mg | DELAYED_RELEASE_CAPSULE | Freq: Every day | ORAL | 5 refills | Status: AC
  Filled 2024-02-11: qty 30, 30d supply, fill #0
  Filled 2024-02-14 – 2024-02-18 (×3): qty 30, 30d supply, fill #1
  Filled 2024-03-07 – 2024-03-13 (×2): qty 30, 30d supply, fill #2
  Filled 2024-03-28 – 2024-04-05 (×4): qty 30, 30d supply, fill #3

## 2024-02-11 NOTE — Progress Notes (Signed)
 Follow-up Note  RE: Sherri  Sherri [name] MRN: [id number] DOB: 1986/01/12 Date of Office Visit: 02/11/2024   History of present illness: Sherri  [name] is a 38 y.o. female presenting today for follow-up of allergic rhinitis, LPRD.  She was last seen in the office on 08/12/2023 by myself. Discussed the use of AI scribe software for clinical note transcription with the patient, who gave verbal consent to proceed.  She experiences a return of allergy  symptoms, including coughing and sneezing, nasal congestion/drainage during the third-fourth week after receiving her monthly allergy  shots. She describes the symptoms as feeling sick with more pronounced allergy  symptoms at the third to fourth week mark.  These symptoms were not present when she was receiving shots every two weeks.   She has been taking Xyzal  for at least six months but feels it may not be as effective as before. Dymista , her nasal spray, is not working as well as it used to either.  She experiences occasional reflux symptoms and has been using Protonix , which causes headaches. She takes Tylenol  to manage these headaches when she has to take Protonix .      Review of systems: 10pt ROS negative unless noted above in HPI  Past medical/social/surgical/family history have been reviewed and are unchanged unless specifically indicated below.  No changes  Medication List: Current Outpatient Medications  Medication Sig Dispense Refill   Acetaminophen  Extra Strength 500 MG TABS TAKE 2 TABLETS (1,000 MG TOTAL) BY MOUTH EVERY 6 (SIX) HOURS AS NEEDED FOR UP TO 14 DAYS (PAIN). 112 tablet 0   amitriptyline  (ELAVIL ) 50 MG tablet Take 50 mg by mouth.     amLODipine  (NORVASC ) 10 MG tablet Take 1 tablet (10 mg total) by mouth daily. 90 tablet 1   atenolol -chlorthalidone  (TENORETIC ) 50-25 MG tablet Take 1 tablet by mouth daily. 90 tablet 0   azelastine  (ASTELIN ) 0.1 % nasal spray Place 1 spray into both nostrils 2 (two) times daily. Use in each  nostril as directed 30 mL 1   azithromycin  (ZITHROMAX ) 250 MG tablet Take first 2 tablets together, then 1 every day until finished. 6 tablet 0   benzonatate  (TESSALON ) 100 MG capsule Take 1 capsule (100 mg total) by mouth every 8 (eight) hours. 30 capsule 0   cetirizine  (ZYRTEC  ALLERGY ) 10 MG tablet Take 1 tablet (10 mg total) by mouth daily. 30 tablet 5   cyclobenzaprine  (FLEXERIL ) 10 MG tablet Take 1 tablet (10 mg total) by mouth 3 (three) times daily as needed for muscle spasms. 30 tablet 0   Eluxadoline  (VIBERZI ) 75 MG TABS Take 1 tablet (75 mg total) by mouth 2 (two) times daily. 60 tablet 5   fluticasone  (FLONASE ) 50 MCG/ACT nasal spray Place 2 sprays into both nostrils daily. 16 g 0   furosemide (LASIX) 20 MG tablet Take 20 mg by mouth.     hydrOXYzine  (ATARAX ) 25 MG tablet 25 mg at bedtime as needed (sleep).     ibuprofen  (ADVIL ) 600 MG tablet TAKE 1 TABLET (600 MG TOTAL) BY MOUTH EVERY 6 (SIX) HOURS AS NEEDED FOR UP TO 14 DAYS. 56 tablet 0   lansoprazole  (PREVACID ) 30 MG capsule Take 1 capsule (30 mg total) by mouth daily at 12 noon. 30 capsule 5   lidocaine  (LIDODERM ) 5 % Place 1 patch onto the skin daily. Remove & Discard patch within 12 hours or as directed by MD 14 patch 0   losartan (COZAAR) 25 MG tablet Take 25 mg by mouth daily.  methocarbamol  (ROBAXIN ) 500 MG tablet Take 1 tablet (500 mg total) by mouth 3 (three) times daily for 14 days (Patient taking differently: Take 500 mg by mouth as needed for muscle spasms.) 42 tablet 0   montelukast  (SINGULAIR ) 10 MG tablet Take 10 mg by mouth at bedtime as needed (allergies).     naproxen  (NAPROSYN ) 375 MG tablet Oral (Patient taking differently: as needed for mild pain (pain score 1-3), moderate pain (pain score 4-6) or headache.)     omega-3 acid ethyl esters (LOVAZA) 1 g capsule Take 1 capsule by mouth 2 (two) times daily.     polyethylene glycol (MIRALAX  / GLYCOLAX ) 17 g packet Take 17 g by mouth daily as needed for mild  constipation.     rizatriptan  (MAXALT ) 10 MG tablet Take 1 tablet (10 mg total) by mouth as needed for migraine. May repeat in 2 hours if needed.  Maximum 2 tablets in 24 hours. 10 tablet 5   valACYclovir  (VALTREX ) 1000 MG tablet Take 1,000 mg by mouth as needed (Outbreaks).     Vitamin D , Ergocalciferol , (DRISDOL) 1.25 MG (50000 UNIT) CAPS capsule Take 50,000 Units by mouth every 7 (seven) days.     Azelastine -Fluticasone  137-50 MCG/ACT SUSP Place 1 spray into the nose 2 (two) times daily as needed. 23 g 3   EPINEPHrine  (EPIPEN  2-PAK) 0.3 mg/0.3 mL IJ SOAJ injection Inject 0.3 mg into the muscle as needed for anaphylaxis. 2 each 1   ondansetron  (ZOFRAN -ODT) 4 MG disintegrating tablet Take 1 tablet (4 mg total) by mouth every 8 (eight) hours as needed for nausea or vomiting. (Patient not taking: Reported on 02/11/2024) 6 tablet 0   ondansetron  (ZOFRAN -ODT) 8 MG disintegrating tablet Place 1 tablet (8 mg total) inside cheek 2 (two) times daily as needed for nausea. 60 tablet 3   No current facility-administered medications for this visit.     Known medication allergies: Allergies[1]   Physical examination: Blood pressure 124/76, pulse 84, temperature 97.9 F (36.6 C), temperature source Temporal, resp. rate 16, height 4' 11.25 (1.505 m), weight 186 lb 11.2 oz (84.7 kg), last menstrual period 01/10/2024, SpO2 97%.  General: Alert, interactive, in no acute distress. HEENT: PERRLA, TMs pearly gray, turbinates moderately edematous with clear discharge, post-pharynx non erythematous. Neck: Supple without lymphadenopathy. Lungs: Clear to auscultation without wheezing, rhonchi or rales. {no increased work of breathing. CV: Normal S1, S2 without murmurs. Abdomen: Nondistended, nontender. Skin: 1Warm and dry, without lesions or rashes. Extremities:  No clubbing, cyanosis or edema. Neuro:   Grossly intact.  Diagnostics/Labs: None today  Assessment and plan: Allergic rhinitis LPRD   1.  Allergen avoidance measures - grass pollen, dust mite, mold  2. Treat and prevent inflammation of airway:   A. Dymista  1 spray each nostril twice a day as needed for nasal congestion or drainage control.   With using nasal sprays point tip of bottle toward eye on same side nostril and lean head slightly forward for best technique.    B. Change Xyzal  to Zyrtec  10mg  daily   3. Treat and prevent reflux:   A. Minimize any caffeine or chocolate consumption  B. Change Protonix  to Lansoprazole  1 tablet daily (best taken 30 minutes before eating)    4. Continue on allergy  shots at maintenance dosing but will increase interval to 2 weeks at this time for the next 6 months.   Bring your epipen  on your allergy  shot days.    5. Return to clinic in 6 months or sooner if  needed I appreciate the opportunity to take part in Naiomi 's care. Please do not hesitate to contact me with questions.  Sincerely,   Danita Brain, MD Allergy /Immunology [facility]      [1]  Allergies Allergen Reactions   Augmentin  [Amoxicillin -Pot Clavulanate] Nausea And Vomiting   Macrobid  [Nitrofurantoin ] Nausea And Vomiting   Metformin  And Related Nausea And Vomiting   Doxycycline  Nausea And Vomiting   Amoxicillin  Rash   Cefdinir  Itching

## 2024-02-11 NOTE — Patient Instructions (Addendum)
°  1. Allergen avoidance measures - grass pollen, dust mite, mold  2. Treat and prevent inflammation of airway:   A. Dymista  1 spray each nostril twice a day as needed for nasal congestion or drainage control.   With using nasal sprays point tip of bottle toward eye on same side nostril and lean head slightly forward for best technique.    B. Change Xyzal  to Zyrtec  10mg  daily   3. Treat and prevent reflux:   A. Minimize any caffeine or chocolate consumption  B. Change Protonix  to Lansoprazole  1 tablet daily (best taken 30 minutes before eating)    4. Continue on allergy  shots at maintenance dosing but will increase interval to 2 weeks at this time for the next 6 months.   Bring your epipen  on your allergy  shot days.    5. Return to clinic in 6 months or sooner if needed

## 2024-02-14 ENCOUNTER — Ambulatory Visit

## 2024-02-14 DIAGNOSIS — J302 Other seasonal allergic rhinitis: Secondary | ICD-10-CM

## 2024-02-14 DIAGNOSIS — J3089 Other allergic rhinitis: Secondary | ICD-10-CM | POA: Diagnosis not present

## 2024-02-15 ENCOUNTER — Other Ambulatory Visit (HOSPITAL_COMMUNITY): Payer: Self-pay

## 2024-02-15 ENCOUNTER — Ambulatory Visit: Admitting: Podiatry

## 2024-02-15 VITALS — Ht 59.25 in | Wt 186.7 lb

## 2024-02-15 DIAGNOSIS — M722 Plantar fascial fibromatosis: Secondary | ICD-10-CM | POA: Diagnosis not present

## 2024-02-16 ENCOUNTER — Other Ambulatory Visit (HOSPITAL_COMMUNITY): Payer: Self-pay

## 2024-02-17 NOTE — Progress Notes (Signed)
 Subjective:  Patient ID: Sherri [name], female    DOB: 1985/11/29,  MRN: [id number]  Chief Complaint  Patient presents with   Foot Problem    RM 1 Patient is here for MRI results.     38 y.o. female returns for post-op check.  Still having pain every day, no change in symptoms since last visit  Review of Systems: Negative except as noted in the HPI. Denies N/V/F/Ch.   Objective:  There were no vitals filed for this visit. Body mass index is 37.39 kg/m. Constitutional Well developed. Well nourished.  Vascular Foot warm and well perfused. Capillary refill normal to all digits.  Calf is soft and supple, no posterior calf or knee pain, negative Homans' sign  Neurologic Normal speech. Oriented to person, place, and time. Epicritic sensation to light touch grossly present bilaterally.  Dermatologic Incisions are well-healed and not hypertrophic  Orthopedic: Continues to have pain around the bottom plantar and medial and lateral heel globally   Narrative & Impression  CLINICAL DATA:  Chronic bilateral heel pain   EXAM: MR OF THE RIGHT HEEL WITHOUT CONTRAST   TECHNIQUE: Multiplanar, multisequence MR imaging of the right heel was performed. No intravenous contrast was administered.   COMPARISON:  12/05/2022   FINDINGS: TENDONS   Peroneal: Peroneal longus tendon intact. Peroneal brevis intact.   Posteromedial: Posterior tibial tendon intact. Flexor hallucis longus tendon intact. Flexor digitorum longus tendon intact.   Anterior: Tibialis anterior tendon intact. Extensor hallucis longus tendon intact Extensor digitorum longus tendon intact.   Achilles:  Intact.   Plantar Fascia: Mild plantar fasciitis of the lateral cord of the plantar fascia. Attenuation of the central cord of the plantar fascia a 2.5 cm from the calcaneal insertion likely reflecting postsurgical changes with possible residual mild plantar fasciitis.   LIGAMENTS   Lateral: Chronic anterior  talofibular ligament tear. Calcaneofibular ligament intact. Posterior talofibular ligament intact. Anterior and posterior tibiofibular ligaments intact.   Medial: Deltoid ligament intact. Spring ligament intact.   CARTILAGE   Ankle Joint: No joint effusion. Normal ankle mortise. No chondral defect.   Subtalar Joints/Sinus Tarsi: Normal subtalar joints. No subtalar joint effusion. Normal sinus tarsi.   Bones: No aggressive osseous lesion. No fracture or dislocation.   Soft Tissue: No fluid collection or hematoma. Muscles are normal without edema or atrophy. Tarsal tunnel is normal.   IMPRESSION: 1. Mild plantar fasciitis of the lateral cord of the plantar fascia. Attenuation of the central cord of the plantar fascia 2.5 cm from the calcaneal insertion likely reflecting postsurgical changes with possible residual mild plantar fasciitis.     Electronically Signed   By: Julaine Blanch M.D.   On: 02/03/2024 14:19   Study Result  Narrative & Impression  CLINICAL DATA:  Chronic bilateral heel pain   EXAM: MR OF THE LEFT HEEL WITHOUT CONTRAST   TECHNIQUE: Multiplanar, multisequence MR imaging of the left heel was performed. No intravenous contrast was administered.   COMPARISON:  None Available.   FINDINGS: TENDONS   Peroneal: Peroneal longus tendon intact. Peroneal brevis intact.   Posteromedial: Posterior tibial tendon intact. Flexor hallucis longus tendon intact. Flexor digitorum longus tendon intact.   Anterior: Tibialis anterior tendon intact. Extensor hallucis longus tendon intact Extensor digitorum longus tendon intact.   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   Plantar Fascia: Mild plantar fasciitis of  the central and lateral cords of the plantar fascia just distal to the calcaneal  insertion.   LIGAMENTS   Lateral: Anterior talofibular ligament intact. Calcaneofibular ligament intact. Posterior talofibular ligament intact. Anterior and posterior tibiofibular ligaments intact.   Medial: Deltoid ligament intact. Spring ligament intact.   CARTILAGE   Ankle Joint: No joint effusion. Normal ankle mortise. No chondral defect.   Subtalar Joints/Sinus Tarsi: Normal subtalar joints. No subtalar joint effusion. Normal sinus tarsi.   Bones: No aggressive osseous lesion. No fracture or dislocation.   Soft Tissue: No fluid collection or hematoma. Muscles are normal without edema or atrophy. Tarsal tunnel is normal.   IMPRESSION: 1. 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 2. Mild plantar fasciitis of the central and lateral cords of the plantar fascia just distal to the calcaneal insertion.     Electronically Signed   By: Julaine Blanch M.D.   On: 02/03/2024 14:15    Assessment:   1. Bilateral plantar fasciitis        Plan:  Patient was evaluated and treated and all questions answered.  S/p foot surgery left We reviewed her bilateral MRI results.  We discussed that the lateral plantar fascial cord is likely inflamed at this point.  We discussed the rationale for only completing a two thirds plantar fasciotomy initially.  Unfortunate this has not worked well for her and she has continued pain.  We discussed further treatment options and I did recommend a lateral based injection which we performed on both feet today.  We also discussed revision bilateral EPF.  We discussed the risk benefits and potential complications of the procedure.  Recovery time discussed as well hopefully should be a faster recovery and we will plan for the left foot first and then the right likely in February and then March  respectively.  All questions addressed.  Informed consent signed and reviewed.  Surgical be scheduled and unless she improves from today's injection and then we will plan to proceed with surgery in the spring.   After sterile prep with povidone-iodine solution and alcohol, the bilateral heel was injected with 0.5cc 2% xylocaine  plain, 0.5cc 0.5% marcaine  plain, 20 mg triamcinolone  acetonide, and 4 mg dexamethasone  was injected along the lateral plantar fascia at the insertion on the plantar calcaneus. The patient tolerated the procedure well without complication.    Surgical plan:  Procedure: - Left EPF first, followed by right EPF, PRP injection with each  Location: - [facility]  Anesthesia plan: - Sedation with block  Postoperative pain plan: - Tylenol  1000 mg every 6 hours, ibuprofen  600 mg every 6 hours, gabapentin  300 mg every 8 hours x5 days, oxycodone  5 mg 1-2 tabs every 6 hours only as needed  DVT prophylaxis: - None required  WB Restrictions / DME needs: - WBAT in CAM boot postop  No follow-ups on file.

## 2024-02-18 ENCOUNTER — Other Ambulatory Visit: Payer: Self-pay

## 2024-02-18 ENCOUNTER — Other Ambulatory Visit (HOSPITAL_COMMUNITY): Payer: Self-pay

## 2024-02-23 ENCOUNTER — Other Ambulatory Visit (HOSPITAL_COMMUNITY): Payer: Self-pay

## 2024-02-25 ENCOUNTER — Other Ambulatory Visit (HOSPITAL_COMMUNITY): Payer: Self-pay

## 2024-02-25 ENCOUNTER — Other Ambulatory Visit: Payer: Self-pay

## 2024-02-25 NOTE — Telephone Encounter (Signed)
 Pharmacy Patient Advocate Encounter  Received notification from [facility] that Prior Authorization for BOTOX has been CANCELLED due to      PA #/Case ID/Reference #: [id number]

## 2024-02-28 ENCOUNTER — Ambulatory Visit

## 2024-02-28 ENCOUNTER — Ambulatory Visit: Admitting: Podiatry

## 2024-02-28 DIAGNOSIS — J3089 Other allergic rhinitis: Secondary | ICD-10-CM

## 2024-02-28 DIAGNOSIS — J302 Other seasonal allergic rhinitis: Secondary | ICD-10-CM | POA: Diagnosis not present

## 2024-02-29 ENCOUNTER — Other Ambulatory Visit: Payer: Self-pay

## 2024-02-29 MED ORDER — AIMOVIG 140 MG/ML ~~LOC~~ SOAJ
140.0000 mg | SUBCUTANEOUS | 5 refills | Status: AC
  Filled 2024-02-29: qty 1, 28d supply, fill #0
  Filled 2024-04-01: qty 1, 28d supply, fill #1

## 2024-02-29 NOTE — Telephone Encounter (Signed)
 Patient called to discuss her Botox  Patient agreed to try Aimovig 140 mg.   Aimovig 140 mg sent to the pharmacy.

## 2024-02-29 NOTE — Telephone Encounter (Signed)
Patient advised Via Mychart

## 2024-02-29 NOTE — Addendum Note (Signed)
 Addended by: OZELL JESUSA PARAS on: 02/29/2024 03:43 PM   Modules accepted: Orders

## 2024-03-01 ENCOUNTER — Telehealth: Payer: Self-pay | Admitting: Pharmacy Technician

## 2024-03-01 ENCOUNTER — Other Ambulatory Visit (HOSPITAL_COMMUNITY): Payer: Self-pay

## 2024-03-01 ENCOUNTER — Other Ambulatory Visit: Payer: Self-pay

## 2024-03-01 NOTE — Telephone Encounter (Signed)
 Pharmacy Patient Advocate Encounter  Received notification from [facility] MEDICAID that Prior Authorization for Aimovig 140MG /ML auto-injectors has been APPROVED from 03/01/2024 to 05/30/2024   PA #/Case ID/Reference #: [id number]

## 2024-03-01 NOTE — Telephone Encounter (Signed)
 Pharmacy Patient Advocate Encounter   Received notification from Patient Pharmacy that prior authorization for Aimovig 140MG /ML auto-injectors  is required/requested.   Insurance verification completed.   The patient is insured through [facility] MEDICAID.   Per test claim: PA required; PA submitted to above mentioned insurance via Latent Key/confirmation #/EOC [facility] Status is pending

## 2024-03-03 ENCOUNTER — Other Ambulatory Visit (HOSPITAL_COMMUNITY): Payer: Self-pay

## 2024-03-03 ENCOUNTER — Other Ambulatory Visit: Payer: Self-pay

## 2024-03-03 MED ORDER — ROSUVASTATIN CALCIUM 10 MG PO TABS
10.0000 mg | ORAL_TABLET | Freq: Every day | ORAL | 5 refills | Status: DC
  Filled 2024-03-03: qty 30, 30d supply, fill #0

## 2024-03-04 ENCOUNTER — Other Ambulatory Visit (HOSPITAL_COMMUNITY): Payer: Self-pay

## 2024-03-04 MED ORDER — ATENOLOL-CHLORTHALIDONE 50-25 MG PO TABS
1.0000 | ORAL_TABLET | Freq: Every day | ORAL | 0 refills | Status: AC
  Filled 2024-03-04: qty 90, 90d supply, fill #0

## 2024-03-07 ENCOUNTER — Other Ambulatory Visit (HOSPITAL_COMMUNITY): Payer: Self-pay

## 2024-03-07 MED ORDER — VALACYCLOVIR HCL 500 MG PO TABS
500.0000 mg | ORAL_TABLET | Freq: Two times a day (BID) | ORAL | 0 refills | Status: AC
  Filled 2024-03-07: qty 6, 3d supply, fill #0

## 2024-03-08 ENCOUNTER — Other Ambulatory Visit (HOSPITAL_COMMUNITY): Payer: Self-pay

## 2024-03-09 ENCOUNTER — Other Ambulatory Visit (HOSPITAL_COMMUNITY): Payer: Self-pay

## 2024-03-09 ENCOUNTER — Other Ambulatory Visit: Payer: Self-pay

## 2024-03-09 MED ORDER — PRAVASTATIN SODIUM 10 MG PO TABS
10.0000 mg | ORAL_TABLET | Freq: Every day | ORAL | 5 refills | Status: AC
  Filled 2024-03-09: qty 30, 30d supply, fill #0
  Filled 2024-03-28 – 2024-04-01 (×3): qty 30, 30d supply, fill #1

## 2024-03-10 ENCOUNTER — Ambulatory Visit: Admitting: Neurology

## 2024-03-13 ENCOUNTER — Ambulatory Visit

## 2024-03-13 DIAGNOSIS — J302 Other seasonal allergic rhinitis: Secondary | ICD-10-CM

## 2024-03-14 ENCOUNTER — Other Ambulatory Visit: Payer: Self-pay

## 2024-03-14 ENCOUNTER — Other Ambulatory Visit (HOSPITAL_COMMUNITY): Payer: Self-pay

## 2024-03-14 MED ORDER — FLUCONAZOLE 150 MG PO TABS
150.0000 mg | ORAL_TABLET | Freq: Once | ORAL | 0 refills | Status: AC
  Filled 2024-03-14: qty 1, 1d supply, fill #0

## 2024-03-15 ENCOUNTER — Telehealth: Payer: Self-pay

## 2024-03-15 NOTE — Telephone Encounter (Signed)
 Returned call and pt is inquiring about surgery schedule and if there are any new openings. Advised message sent to surgery coordinator

## 2024-03-16 ENCOUNTER — Telehealth: Payer: Self-pay

## 2024-03-16 NOTE — Telephone Encounter (Signed)
 Patient left a message with her dr's office requesting to reschedule her surgery that she had to cancel due to sickness in October, 2025.   I called patient to see if she's available for surgery with Dr. Alger on 04/25/24 at 1000. Patient agreed to details and pre-op instructions were provided by phone.

## 2024-03-22 ENCOUNTER — Ambulatory Visit: Admitting: Neurology

## 2024-03-23 ENCOUNTER — Ambulatory Visit

## 2024-03-23 DIAGNOSIS — J302 Other seasonal allergic rhinitis: Secondary | ICD-10-CM

## 2024-03-24 ENCOUNTER — Other Ambulatory Visit (HOSPITAL_COMMUNITY): Payer: Self-pay

## 2024-03-24 ENCOUNTER — Other Ambulatory Visit: Payer: Self-pay

## 2024-03-24 MED ORDER — METOPROLOL TARTRATE 100 MG PO TABS
100.0000 mg | ORAL_TABLET | Freq: Once | ORAL | 0 refills | Status: AC
  Filled 2024-03-24: qty 1, 1d supply, fill #0

## 2024-03-28 ENCOUNTER — Other Ambulatory Visit (HOSPITAL_COMMUNITY): Payer: Self-pay

## 2024-03-28 NOTE — Progress Notes (Signed)
 VIALS MADE ON 03/28/24

## 2024-03-29 ENCOUNTER — Other Ambulatory Visit: Payer: Self-pay

## 2024-03-29 ENCOUNTER — Other Ambulatory Visit (HOSPITAL_COMMUNITY): Payer: Self-pay

## 2024-04-02 ENCOUNTER — Other Ambulatory Visit (HOSPITAL_COMMUNITY): Payer: Self-pay

## 2024-04-03 ENCOUNTER — Other Ambulatory Visit: Payer: Self-pay

## 2024-04-04 ENCOUNTER — Ambulatory Visit

## 2024-04-04 DIAGNOSIS — J302 Other seasonal allergic rhinitis: Secondary | ICD-10-CM | POA: Diagnosis not present

## 2024-04-25 ENCOUNTER — Ambulatory Visit (HOSPITAL_COMMUNITY): Admit: 2024-04-25 | Admitting: Obstetrics and Gynecology

## 2024-04-25 SURGERY — ABLATION, ENDOMETRIUM, HYSTEROSCOPIC
Anesthesia: Choice

## 2024-08-11 ENCOUNTER — Ambulatory Visit: Admitting: Allergy

## 2024-10-09 ENCOUNTER — Ambulatory Visit: Payer: Self-pay | Admitting: Neurology

## 2024-10-23 ENCOUNTER — Ambulatory Visit: Admitting: Neurology
